# Patient Record
Sex: Female | Born: 1940 | Race: White | Hispanic: No | Smoking: Former smoker
Health system: Southern US, Community
[De-identification: ages and names within clinical notes are randomized; demographics above are authoritative.]

## PROBLEM LIST (undated history)

## (undated) DIAGNOSIS — W3400XA Accidental discharge from unspecified firearms or gun, initial encounter: Secondary | ICD-10-CM

## (undated) DIAGNOSIS — Z5112 Encounter for antineoplastic immunotherapy: Secondary | ICD-10-CM

## (undated) DIAGNOSIS — Z87442 Personal history of urinary calculi: Secondary | ICD-10-CM

## (undated) DIAGNOSIS — I639 Cerebral infarction, unspecified: Secondary | ICD-10-CM

## (undated) DIAGNOSIS — I499 Cardiac arrhythmia, unspecified: Secondary | ICD-10-CM

## (undated) DIAGNOSIS — K219 Gastro-esophageal reflux disease without esophagitis: Secondary | ICD-10-CM

## (undated) DIAGNOSIS — S41031A Puncture wound without foreign body of right shoulder, initial encounter: Secondary | ICD-10-CM

## (undated) DIAGNOSIS — Z87440 Personal history of urinary (tract) infections: Secondary | ICD-10-CM

## (undated) DIAGNOSIS — K769 Liver disease, unspecified: Secondary | ICD-10-CM

## (undated) DIAGNOSIS — J189 Pneumonia, unspecified organism: Secondary | ICD-10-CM

## (undated) DIAGNOSIS — K8020 Calculus of gallbladder without cholecystitis without obstruction: Secondary | ICD-10-CM

## (undated) DIAGNOSIS — E669 Obesity, unspecified: Secondary | ICD-10-CM

## (undated) DIAGNOSIS — E785 Hyperlipidemia, unspecified: Secondary | ICD-10-CM

## (undated) DIAGNOSIS — C50919 Malignant neoplasm of unspecified site of unspecified female breast: Secondary | ICD-10-CM

## (undated) DIAGNOSIS — C3490 Malignant neoplasm of unspecified part of unspecified bronchus or lung: Secondary | ICD-10-CM

## (undated) DIAGNOSIS — I10 Essential (primary) hypertension: Secondary | ICD-10-CM

## (undated) DIAGNOSIS — N19 Unspecified kidney failure: Secondary | ICD-10-CM

## (undated) DIAGNOSIS — C189 Malignant neoplasm of colon, unspecified: Secondary | ICD-10-CM

## (undated) DIAGNOSIS — I4891 Unspecified atrial fibrillation: Secondary | ICD-10-CM

## (undated) DIAGNOSIS — T148XXA Other injury of unspecified body region, initial encounter: Secondary | ICD-10-CM

## (undated) DIAGNOSIS — G459 Transient cerebral ischemic attack, unspecified: Secondary | ICD-10-CM

## (undated) DIAGNOSIS — R200 Anesthesia of skin: Secondary | ICD-10-CM

## (undated) DIAGNOSIS — Z9981 Dependence on supplemental oxygen: Secondary | ICD-10-CM

## (undated) DIAGNOSIS — N6019 Diffuse cystic mastopathy of unspecified breast: Secondary | ICD-10-CM

## (undated) DIAGNOSIS — L989 Disorder of the skin and subcutaneous tissue, unspecified: Secondary | ICD-10-CM

## (undated) DIAGNOSIS — J302 Other seasonal allergic rhinitis: Secondary | ICD-10-CM

## (undated) DIAGNOSIS — H9319 Tinnitus, unspecified ear: Secondary | ICD-10-CM

## (undated) DIAGNOSIS — R0602 Shortness of breath: Secondary | ICD-10-CM

## (undated) DIAGNOSIS — J449 Chronic obstructive pulmonary disease, unspecified: Secondary | ICD-10-CM

## (undated) DIAGNOSIS — C801 Malignant (primary) neoplasm, unspecified: Secondary | ICD-10-CM

## (undated) DIAGNOSIS — B029 Zoster without complications: Secondary | ICD-10-CM

## (undated) DIAGNOSIS — G629 Polyneuropathy, unspecified: Secondary | ICD-10-CM

## (undated) DIAGNOSIS — D649 Anemia, unspecified: Secondary | ICD-10-CM

## (undated) DIAGNOSIS — R202 Paresthesia of skin: Secondary | ICD-10-CM

## (undated) HISTORY — DX: Cerebral infarction, unspecified: I63.9

## (undated) HISTORY — DX: Malignant neoplasm of unspecified site of unspecified female breast: C50.919

## (undated) HISTORY — DX: Other seasonal allergic rhinitis: J30.2

## (undated) HISTORY — PX: DILATION AND CURETTAGE OF UTERUS: SHX78

## (undated) HISTORY — DX: Calculus of gallbladder without cholecystitis without obstruction: K80.20

## (undated) HISTORY — DX: Malignant (primary) neoplasm, unspecified: C80.1

## (undated) HISTORY — DX: Hyperlipidemia, unspecified: E78.5

## (undated) HISTORY — DX: Essential (primary) hypertension: I10

## (undated) HISTORY — DX: Unspecified atrial fibrillation: I48.91

## (undated) HISTORY — PX: OTHER SURGICAL HISTORY: SHX169

## (undated) HISTORY — PX: LUNG CANCER SURGERY: SHX702

## (undated) HISTORY — DX: Liver disease, unspecified: K76.9

## (undated) HISTORY — DX: Malignant neoplasm of colon, unspecified: C18.9

## (undated) HISTORY — DX: Diffuse cystic mastopathy of unspecified breast: N60.19

## (undated) HISTORY — DX: Encounter for antineoplastic immunotherapy: Z51.12

## (undated) HISTORY — DX: Transient cerebral ischemic attack, unspecified: G45.9

## (undated) HISTORY — DX: Zoster without complications: B02.9

## (undated) HISTORY — PX: BREAST SURGERY: SHX581

## (undated) HISTORY — DX: Obesity, unspecified: E66.9

## (undated) HISTORY — PX: COLONOSCOPY: SHX174

---
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
## 1988-12-27 HISTORY — PX: VAGINAL HYSTERECTOMY: SUR661

## 2002-12-27 DIAGNOSIS — I639 Cerebral infarction, unspecified: Secondary | ICD-10-CM

## 2002-12-27 HISTORY — DX: Cerebral infarction, unspecified: I63.9

## 2006-07-25 ENCOUNTER — Encounter: Payer: Self-pay | Admitting: Internal Medicine

## 2009-05-21 ENCOUNTER — Ambulatory Visit: Payer: Self-pay | Admitting: Internal Medicine

## 2009-05-21 DIAGNOSIS — I6350 Cerebral infarction due to unspecified occlusion or stenosis of unspecified cerebral artery: Secondary | ICD-10-CM

## 2009-07-30 ENCOUNTER — Ambulatory Visit: Payer: Self-pay | Admitting: Internal Medicine

## 2009-07-30 ENCOUNTER — Ambulatory Visit: Payer: Self-pay

## 2009-07-30 ENCOUNTER — Encounter: Payer: Self-pay | Admitting: Internal Medicine

## 2009-09-10 ENCOUNTER — Ambulatory Visit: Payer: Self-pay | Admitting: Internal Medicine

## 2010-02-17 ENCOUNTER — Ambulatory Visit: Payer: Self-pay | Admitting: Internal Medicine

## 2010-02-20 ENCOUNTER — Telehealth: Payer: Self-pay | Admitting: Internal Medicine

## 2010-04-12 ENCOUNTER — Emergency Department (HOSPITAL_COMMUNITY): Admission: EM | Admit: 2010-04-12 | Discharge: 2010-04-12 | Payer: Self-pay | Admitting: Emergency Medicine

## 2010-04-14 ENCOUNTER — Telehealth: Payer: Self-pay | Admitting: Internal Medicine

## 2010-04-21 ENCOUNTER — Ambulatory Visit (HOSPITAL_BASED_OUTPATIENT_CLINIC_OR_DEPARTMENT_OTHER): Admission: RE | Admit: 2010-04-21 | Discharge: 2010-04-21 | Payer: Self-pay | Admitting: Urology

## 2010-05-13 ENCOUNTER — Ambulatory Visit: Payer: Self-pay | Admitting: Cardiology

## 2010-05-22 ENCOUNTER — Telehealth (INDEPENDENT_AMBULATORY_CARE_PROVIDER_SITE_OTHER): Payer: Self-pay | Admitting: *Deleted

## 2010-06-01 ENCOUNTER — Ambulatory Visit (HOSPITAL_COMMUNITY): Admission: RE | Admit: 2010-06-01 | Discharge: 2010-06-01 | Payer: Self-pay | Admitting: Urology

## 2010-06-15 ENCOUNTER — Encounter: Payer: Self-pay | Admitting: Cardiology

## 2010-08-20 ENCOUNTER — Encounter: Payer: Self-pay | Admitting: Pulmonary Disease

## 2010-08-20 ENCOUNTER — Encounter: Admission: RE | Admit: 2010-08-20 | Discharge: 2010-08-20 | Payer: Self-pay | Admitting: Family Medicine

## 2010-09-04 ENCOUNTER — Ambulatory Visit: Payer: Self-pay | Admitting: Pulmonary Disease

## 2010-09-04 ENCOUNTER — Encounter: Payer: Self-pay | Admitting: Pulmonary Disease

## 2010-09-04 DIAGNOSIS — R0609 Other forms of dyspnea: Secondary | ICD-10-CM

## 2010-09-04 DIAGNOSIS — R932 Abnormal findings on diagnostic imaging of liver and biliary tract: Secondary | ICD-10-CM | POA: Insufficient documentation

## 2010-09-04 DIAGNOSIS — R930 Abnormal findings on diagnostic imaging of skull and head, not elsewhere classified: Secondary | ICD-10-CM

## 2010-09-04 LAB — CONVERTED CEMR LAB: Sed Rate: 17 mm/hr (ref 0–22)

## 2010-09-07 ENCOUNTER — Telehealth: Payer: Self-pay | Admitting: Pulmonary Disease

## 2010-09-07 LAB — CONVERTED CEMR LAB: Rhuematoid fact SerPl-aCnc: <20 intl units/mL (ref 0–20)

## 2010-11-12 ENCOUNTER — Telehealth (INDEPENDENT_AMBULATORY_CARE_PROVIDER_SITE_OTHER): Payer: Self-pay | Admitting: *Deleted

## 2010-11-30 ENCOUNTER — Encounter: Admission: RE | Admit: 2010-11-30 | Discharge: 2010-11-30 | Payer: Self-pay | Admitting: Family Medicine

## 2010-11-30 ENCOUNTER — Encounter: Payer: Self-pay | Admitting: Pulmonary Disease

## 2010-12-04 ENCOUNTER — Encounter: Payer: Self-pay | Admitting: Pulmonary Disease

## 2010-12-04 ENCOUNTER — Telehealth (INDEPENDENT_AMBULATORY_CARE_PROVIDER_SITE_OTHER): Payer: Self-pay | Admitting: *Deleted

## 2010-12-07 ENCOUNTER — Encounter: Payer: Self-pay | Admitting: Cardiology

## 2010-12-09 ENCOUNTER — Ambulatory Visit: Payer: Self-pay | Admitting: Cardiology

## 2010-12-09 ENCOUNTER — Encounter: Payer: Self-pay | Admitting: Cardiology

## 2010-12-10 ENCOUNTER — Ambulatory Visit: Payer: Self-pay | Admitting: Pulmonary Disease

## 2010-12-10 DIAGNOSIS — J984 Other disorders of lung: Secondary | ICD-10-CM | POA: Insufficient documentation

## 2010-12-15 ENCOUNTER — Ambulatory Visit: Payer: Self-pay | Admitting: Pulmonary Disease

## 2010-12-22 ENCOUNTER — Encounter: Payer: Self-pay | Admitting: Cardiology

## 2010-12-24 ENCOUNTER — Telehealth: Payer: Self-pay | Admitting: Cardiology

## 2010-12-25 ENCOUNTER — Ambulatory Visit (HOSPITAL_COMMUNITY)
Admission: RE | Admit: 2010-12-25 | Discharge: 2010-12-25 | Payer: Self-pay | Source: Home / Self Care | Attending: Emergency Medicine | Admitting: Emergency Medicine

## 2010-12-29 ENCOUNTER — Telehealth: Payer: Self-pay | Admitting: Pulmonary Disease

## 2011-01-04 ENCOUNTER — Ambulatory Visit
Admission: RE | Admit: 2011-01-04 | Discharge: 2011-01-04 | Payer: Self-pay | Source: Home / Self Care | Attending: Pulmonary Disease | Admitting: Pulmonary Disease

## 2011-01-07 ENCOUNTER — Telehealth (INDEPENDENT_AMBULATORY_CARE_PROVIDER_SITE_OTHER): Payer: Self-pay | Admitting: *Deleted

## 2011-01-11 ENCOUNTER — Telehealth: Payer: Self-pay | Admitting: Cardiology

## 2011-01-21 ENCOUNTER — Other Ambulatory Visit: Payer: Self-pay | Admitting: Pulmonary Disease

## 2011-01-21 DIAGNOSIS — R911 Solitary pulmonary nodule: Secondary | ICD-10-CM

## 2011-01-26 NOTE — Assessment & Plan Note (Signed)
Summary: 4 month rov/sl   Visit Type:  Follow-up Primary Provider:  Marjory Lies  CC:  palpitations Pt has moved to [facility] and would like to be seen out there - Pt need refills today [facility] [address].  History of Present Illness: Tammie [name] is seen in followup for what is now likely permananent atrial fibrillation At her last visit we increased her beta blocker and discontinued jer calcium blocker because of edema. That has resolved.  .  We anticiapted need to assess heaert rated excursion.  She is having some palpitations but overall is feeling pretty good. Her exercise tolerance is quite good. Her shortness of breath is decreased  Current Medications (verified): 1)  Multivitamins   Tabs (Multiple Vitamin) .Marland Kitchen.. 1 Tab Once Daily 2)  Diphenhydramine Hcl 25 Mg Caps (Diphenhydramine Hcl) .Marland Kitchen.. 1 Cap At Bedtime 3)  Aspirin 81 Mg Tbec (Aspirin) .... Take One Tablet By Mouth Daily 4)  Flax Seed Oil 1000 Mg Caps (Flaxseed (Linseed)) .Marland Kitchen.. 1 Cap Once Daily 5)  Garlic 1000 Mg Caps (Garlic) .Marland Kitchen.. 1 Cap Once Daily 6)  Vitamin D 1000 Unit Caps (Cholecalciferol) .Marland Kitchen.. 1 Cap Once Daily 7)  Calcium Citrate + D 300-100 Mg-Unit Tabs (Calcium Citrate-Vitamin D) .Marland Kitchen.. 1 Cap Once Daily 8)  Fish Oil 1000 Mg Caps (Omega-3 Fatty Acids) .Marland Kitchen.. 1 Cap Once Daily 9)  Mapap Arthritis Pain 650 Mg Cr-Tabs (Acetaminophen) .... 2 Caps Two Times A Day 10)  Warfarin Sodium 5 Mg Tabs (Warfarin Sodium) .... Use As Directed By Anticoagulation Clinic 11)  Metoprolol Tartrate 100 Mg Tabs (Metoprolol Tartrate) .... Take 1/2 Two Times A Day  Allergies (verified): 1)  ! * Tape  Past History:  Past Medical History: Last updated: 05/21/2009 Atrial fibrillation Hypertension Prior stroke x2 Asthma Arthritis Allergies-seasonal Nephrolithiasis Cystic breast disease  Past Surgical History: Last updated: 05/21/2009 tonsillectomy Hysterectomy D&C Breast cystectomy  Family History: Last updated: 05/21/2009 Family  History of Cancer:   Social History: Last updated: 05/21/2009 Widowed  Alcohol Use - no Tobacco Use - No.  Regular Exercise - no  Risk Factors: Exercise: no (05/21/2009)  Risk Factors: Smoking Status: never (05/21/2009)  Vital Signs:  Patient profile:   70 year old female Height:      71 inches Weight:      254 pounds BMI:     35.55 Pulse rate:   81 / minute Pulse rhythm:   irregular BP sitting:   136 / 76  (left arm) Cuff size:   large  Vitals Entered By: Tammie Kanaris, CNA (February 17, 2010 10:42 AM)  Physical Exam  General:  Well developed, well nourished, in no acute distress. Neck:  Neck supple, no JVD. No masses, thyromegaly or abnormal cervical nodes. Lungs:  Clear bilaterally to auscultation and percussion. Heart:  irregular rate and rhythm without murmurs or gallops Abdomen:  soft nontender with active bowel Extremities:  no clubbing or cyanosis or edema Neurologic:  grossly nor Skin:  warm and dry Psych:  affect normal   EKG  Procedure date:  02/17/2010  Findings:      atrial fibrillation at 82 Intervals-/0.09/0.36 Axis LXXXII  Impression & Recommendations:  Problem # 1:  ATRIAL FIBRILLATION (ICD-427.31) Her atrial fibrillation is permanent.  Will continue her on her beta blocker to 50 mg twice daily for now. She may need augmented rate control later but I'm not convinced  We will discontinue her aspirin at this point as it increases the risk of bleeding but does not seem to  decrease the risk of stroke. I should note that her prior strokes occurred on aspirin and not on Coumadin. The following medications were removed from the medication list:    Aspirin 81 Mg Tbec (Aspirin) .Marland Kitchen... Take one tablet by mouth daily Her updated medication list for this problem includes:    Warfarin Sodium 5 Mg Tabs (Warfarin sodium) ..... Use as directed by anticoagulation clinic    Metoprolol Tartrate 100 Mg Tabs (Metoprolol tartrate) .Marland Kitchen... Take 1/2 two times a  day  Problem # 2:  EDEMA (ICD-782.3) largely improved following the discontinuation of her calcium blocker  Patient Instructions: 1)  Your physician has recommended you make the following change in your medication: STOP ASPIRIN 2)  Your physician recommends that you schedule a follow-up appointment in: 3 MONTHS WITH DR. HOCHREIN

## 2011-01-26 NOTE — Miscellaneous (Signed)
  Clinical Lists Changes  ct chest done by DR.Moore a month early.Marland KitchenMarland KitchenI had scheduled her for a ct in Jan.  The superior segment lesion appears to have enlarged.  Need to consider biopsy.  will arrange ov to discuss.  Appended Document:  mindy, pt needs ov to discuss recent ct.  please arrange for ov. also, please call [facility] and see if they can make a super D disk of the pt's ct from 11/30/10.  please send to [place].  Appended Document:  lmomtcb x1  Appended Document:  called and spoke with [facility] and they will do a super d and send to [place]  Appended Document:  see phone note

## 2011-01-26 NOTE — Progress Notes (Signed)
Summary: REFILL MEDS  Phone Note Refill Request Call back at Home Phone [phone] Message from:  Patient on February 20, 2010 12:20 PM  Refills Requested: Medication #1:  WARFARIN SODIUM 5 MG TABS Use as directed by Anticoagulation Clinic  Medication #2:  METOPROLOL TARTRATE 100 MG TABS take 1/2 two times a day. [facility] [place] [facility] [place] [phone]   Method Requested: Fax to Local Pharmacy Initial call taken by: Lorne Skeens,  February 20, 2010 12:21 PM  Follow-up for Phone Call        Rx faxed to pharmacy Metoprolol  sent flag to coum Follow-up by: Oswald Hillock,  February 20, 2010 1:14 PM    Prescriptions: METOPROLOL TARTRATE 100 MG TABS (METOPROLOL TARTRATE) take 1/2 two times a day  #90 x 1   Entered by:   Oswald Hillock   Authorized by:   Nathen May, MD, [facility]   Signed by:   Oswald Hillock on 02/20/2010   Method used:   Electronically to        [facility]  [address] (retail)       [address]       [place], [place]  [address]       Ph: [phone]       Fax: [phone]   RxID:   [id number]  Contacted PCP Dr. Doristine Counter per ov note.  Thayer Ohm verified pt has coumadin followed by Dr. Doristine Counter.  They will authorize refill of Coumadin

## 2011-01-26 NOTE — Progress Notes (Signed)
Summary: returning call  Phone Note Call from Patient Call back at Home Phone [phone]   Caller: Patient Reason for Call: Talk to Nurse Summary of Call: Returning Mindy's call. Initial call taken by: Lehman Prom,  December 04, 2010 4:17 PM  Follow-up for Phone Call        Mindy, the pt is calling back and I see that she needs ov to discuss results of her recent CT Chest- [facility] first available is not until 12/15/10- do we need to get her in any sooner, or will this date be okay? Pls advise, thanks! Follow-up by: Vernie Murders,  December 04, 2010 4:25 PM  Additional Follow-up for Phone Call Additional follow up Details #1::        PT is coming in on 12/15/2010 at 10:45 with dr. Shelle Iron. I informed pt if we have a cancellation I will try to get her in sooner [facility]  December 04, 2010 5:01 PM

## 2011-01-26 NOTE — Progress Notes (Signed)
Summary: CT in 4 mths  Phone Note From Other Clinic Call back at [phone]   Caller: Aurea Graff from Dr. Vernon Prey office Call For: clance Summary of Call: Aurea Graff states that they did a CT of the pt in August with order to repeat in 3 months, but then in the meantime pt saw Dr. Shelle Iron and rec to repeat in 4 months, so they just wanted to verify this and to make sure CT order was placed. I advised per last OV note he did say to repeat in 4 mths, but I do not see an order for this. Dr Shelle Iron do you want a CT chest onthis pt in 4 months. Pelase advsie.  Initial call taken by: Carron Curie CMA,  November 12, 2010 11:12 AM  Follow-up for Phone Call        she needs a ct chest in JAN.  will send an order to [facility] Follow-up by: Barbaraann Share MD,  November 12, 2010 5:08 PM  Additional Follow-up for Phone Call Additional follow up Details #1::        LMOMTCB Vernie Murders  November 12, 2010 5:12 PM  Spoke with Jan at [facility] office and notified that pt needs ct chest in Jan and that we are already in the process of setting this up for her.   Additional Follow-up by: Vernie Murders,  November 13, 2010 8:51 AM

## 2011-01-26 NOTE — Procedures (Signed)
Summary: Summary Report  Summary Report   Imported By: Erle Crocker 07/17/2010 09:50:57  _____________________________________________________________________  External Attachment:    Type:   Image     Comment:   External Document

## 2011-01-26 NOTE — Assessment & Plan Note (Signed)
Summary: Tammie [name]   Visit Type:  Follow-up Primary Provider:  Dr. Vernon Prey  CC:  atrial fibrillation.  History of Present Illness: The patient presents for followup of the above. She has permanent atrial fibrillation. She was seeing Dr. Graciela Husbands but wanted to be seen here for convenience. He did adjust her medications by discontinuing aspirin. She remains on Coumadin. Because of lower extremity swelling he switched to beta blockers alone for rate control discontinuing calcium blockers. She's noticed no problems with this. She's had no palpitations except occasionally at night. She's had no presyncope or syncope. She's had no chest pressure, neck or arm discomfort. She denies any new shortness of breath though she has some chronic mild dyspnea with activities. She doesn't describe PND or orthopnea. She has very mild lower extremity swelling.  Of note she is being treated currently for nephrolithiasis.  Current Medications (verified): 1)  Multivitamins   Tabs (Multiple Vitamin) .... 1/2 By Mouth Bid 2)  Diphenhydramine Hcl 25 Mg Caps (Diphenhydramine Hcl) .Marland Kitchen.. 1 By Mouth Bid 3)  Flax Seed Oil 1000 Mg Caps (Flaxseed (Linseed)) .Marland Kitchen.. 1 Cap Once Daily 4)  Garlic 1000 Mg Caps (Garlic) .Marland Kitchen.. 1 Cap Once Daily 5)  Vitamin D 1000 Unit Caps (Cholecalciferol) .Marland Kitchen.. 1 Cap Once Daily 6)  Calcium Citrate + D 300-100 Mg-Unit Tabs (Calcium Citrate-Vitamin D) .Marland Kitchen.. 1 Cap Once Daily 7)  Fish Oil 1000 Mg Caps (Omega-3 Fatty Acids) .Marland Kitchen.. 1 Cap Once Daily 8)  Warfarin Sodium 5 Mg Tabs (Warfarin Sodium) .... Use As Directed By Anticoagulation Clinic 9)  Metoprolol Tartrate 100 Mg Tabs (Metoprolol Tartrate) .... Take 1/2 Two Times A Day 10)  Acetaminophen 325 Mg  Tabs (Acetaminophen) .... 2 By Mouth Two Times A Day 11)  Combivent 18-103 Mcg/act Aero (Ipratropium-Albuterol) .... As Needed  Allergies (verified): 1)  ! * Tape  Past History:  Past Medical History: Atrial fibrillation Hypertension Prior stroke  x2 Asthma Arthritis Allergies-seasonal Nephrolithiasis Cystic breast disease Nephrolithiasis  Past Surgical History: Tonsillectomy Hysterectomy D&C Breast cystectomy  Review of Systems       Decreased memory. Otherwise as stated in the history of present illness negative for all other systems.  Vital Signs:  Patient profile:   70 year old female Height:      71 inches Weight:      251 pounds BMI:     35.13 Pulse rate:   72 / minute Resp:     16 per minute BP sitting:   132 / 76  (right arm)  Vitals Entered By: Marrion Coy, CNA (May 13, 2010 10:12 AM)  Physical Exam  General:  Well developed, well nourished, in no acute distress. Head:  normocephalic and atraumatic Eyes:  PERRLA/EOM intact; conjunctiva and lids normal. Mouth:  Upper dentures, gums and palate normal. Oral mucosa normal. Neck:  Neck supple, no JVD. No masses, thyromegaly or abnormal cervical nodes. Chest Wall:  no deformities or breast masses noted Lungs:  Clear bilaterally to auscultation and percussion. Abdomen:  Bowel sounds positive; abdomen soft and non-tender without masses, organomegaly, or hernias noted. No hepatosplenomegaly. Msk:  Back normal, normal gait. Muscle strength and tone normal. Extremities:  No clubbing or cyanosis. Neurologic:  Alert and oriented x 3. Skin:  Intact without lesions or rashes. Psych:  Normal affect.   Detailed Cardiovascular Exam  Neck    Carotids: Carotids full and equal bilaterally without bruits.      Neck Veins: Normal, no JVD.    Heart  Inspection: no deformities or lifts noted.      Palpation: normal PMI with no thrills palpable.      Auscultation: irregular rate and rhythm, S1, S2 without murmurs, rubs, gallops, or clicks.    Vascular    Abdominal Aorta: no palpable masses, pulsations, or audible bruits.      Femoral Pulses: normal femoral pulses bilaterally.      Pedal Pulses: normal pedal pulses bilaterally.      Radial Pulses: normal radial  pulses bilaterally.      Peripheral Circulation: no clubbing, cyanosis, or edema noted with normal capillary refill.     EKG  Procedure date:  05/13/2010  Findings:      Atrial fibrillation, rate 72, axis within normal limits, intervals within normal limits, no acute ST-T wave changes  Impression & Recommendations:  Problem # 1:  ATRIAL FIBRILLATION (ICD-427.31) She seems to have reasonable rate control but with the med changes she needs a 24-hour Holter monitor to evaluate. She will remain on Coumadin. Orders: EKG w/ Interpretation (93000)  Problem # 2:  HYPERTENSION, BENIGN (ICD-401.1) Her blood pressure is controlled. She will continue the meds as listed.  Problem # 3:  CVA (ICD-434.91) The patient continues to have treatment for kidney stones. If she needs to come off the Coumadin she needs a bridging low molecular weight heparin given her prior CVA. I reviewed the records and this information has been relayed to her urologist.  Patient Instructions: 1)  Your physician recommends that you schedule a follow-up appointment in: 1 yr with Dr Antoine Poche in [employer] [employer] 2)  Your physician recommends that you continue on your current medications as directed. Please refer to the Current Medication list given to you today. 3)  Your physician has recommended that you wear a holter monitor.  Holter monitors are medical devices that record the heart's electrical activity. Doctors most often use these monitors to diagnose arrhythmias. Arrhythmias are problems with the speed or rhythm of the heartbeat. The monitor is a small, portable device. You can wear one while you do your normal daily activities. This is usually used to diagnose what is causing palpitations/syncope (passing out). 4)  You have been diagnosed with atrial fibrillation.  Atrial fibrillation is a condition in which one of the upper chambers of the heart has extra electrical cells causing it to beat very fast.  Please see the  handout/brochure given to you today for further information.

## 2011-01-26 NOTE — Progress Notes (Signed)
Summary: results  Phone Note Call from Patient Call back at Home Phone [phone] Call back at [phone] cell   Caller: Patient Call For: clance Reason for Call: Talk to Nurse Summary of Call: pt called for lab results. Initial call taken by: Eugene Gavia,  September 07, 2010 10:13 AM  Follow-up for Phone Call        Pls advise lab results. thanks  Follow-up by: Vernie Murders,  September 07, 2010 10:19 AM  Additional Follow-up for Phone Call Additional follow up Details #1::        see addendum under labs. Additional Follow-up by: Barbaraann Share MD,  September 07, 2010 5:09 PM

## 2011-01-26 NOTE — Progress Notes (Signed)
Summary: stop coumadin prior to kideny stone  Phone Note Call from Patient Call back at Home Phone [phone]   Caller: Patient Reason for Call: Talk to Nurse Summary of Call: pt need to come off coumadin due to kidney stone.  Initial call taken by: Lorne Skeens,  April 14, 2010 12:40 PM  Follow-up for Phone Call        pt saw Urologist Dr Hazle Coca [phone]) this am needs Dr Odessa Fleming approval to hold coumadin so they can bust up stone and remove, if can't hold coumadin then have to put stent in and remove, will forward to Dr Graciela Husbands and Joice Lofts for review Meredith Staggers, RN  April 14, 2010 12:54 PM   Additional Follow-up for Phone Call Additional follow up Details #1::        with prior stroke need [facility] bridging Additional Follow-up by: Nathen May, MD, [facility],  April 14, 2010 5:51 PM     Appended Document: stop coumadin prior to kideny stone [facility] follows patient's Coumadin.  Pt aware of plan.  Note faxed to [facility] and Dr Hazle Coca with Dr Odessa Fleming recommendations.

## 2011-01-26 NOTE — Progress Notes (Signed)
Summary: results from heart monitor  mailbox not set up  Phone Note Call from Patient Call back at Home Phone [phone] Call back at [phone]   Caller: Patient Summary of Call: Pt  calling for result fro heart monitor Initial call taken by: Judie Grieve,  May 22, 2010 9:20 AM  Follow-up for Phone Call        called pt but voicemail box has not been set up yet.  Monitor demonstrates mostly AT Fib with average HR of 81.  Dr Antoine Poche to review on his return   voicemail box still not set up yet.  Will continue to try to call 06/02/10 6 pm  pfh,rn Follow-up by: Charolotte Capuchin, RN,  May 22, 2010 10:58 AM  Additional Follow-up for Phone Call Additional follow up Details #1::        pt aware of results Additional Follow-up by: Charolotte Capuchin, RN,  June 09, 2010 5:26 PM

## 2011-01-26 NOTE — Assessment & Plan Note (Signed)
Summary: consult for abnormal ct chest and dyspnea   Copy to:  Paulita Cradle NP Primary Provider/Referring Provider:  Dr. Vernon Prey  CC:  Pulmonary consult. pt is her to discuss abnormal ct chest results. Pt states NP Bevelyn Buckles told her she had a spot on her lung. SOB with activity and at rest. Pt states she just started having a productive cough with red tint phlem today.  now and pt states she has some wheezing. Pt states she was told she had asthma a couple of years ago. Pt states when she gets too hot she starts to cough .  History of Present Illness: The pt is a 69y/o female who I have been asked to see for an abnormal chest ct, as well as doe.  The pt has had worsening sob the past few months, as well as an early am cough with clear mucus that she thinks is due to postnasal drip.  She denies any hemoptysis, but will see on rare occasions a pink tint to her am mucus.  However, she can get blood out of her nose with blowing at the same time.  She is unsure how many blocks she can walk before she gets sob, but feels she had no issues with walking 6mos ago.  She will not get winded sweeping one room of her house, but can get winded bringing groceries in from the car.  The pt tells me that she has gained 50 pounds over the last one year?  She recently underwent cxr where she was found to have a ?RUL density.  This was followed by a ct chest which revealed no LN, extensive coronary calcifications, as well as small GGO in RUL, RLL, and LLL.  Preventive Screening-Counseling & Management  Alcohol-Tobacco     Smoking Status: quit     Packs/Day: 3.0     Year Started: 1959     Year Quit: 1991  Current Medications (verified): 1)  Multivitamins   Tabs (Multiple Vitamin) .... 1/2 By Mouth Bid 2)  Diphenhydramine Hcl 25 Mg Caps (Diphenhydramine Hcl) .Marland Kitchen.. 1 By Mouth Bid 3)  Flax Seed Oil 1000 Mg Caps (Flaxseed (Linseed)) .Marland Kitchen.. 1 Cap Once Daily 4)  Garlic 1000 Mg Caps (Garlic) .Marland Kitchen.. 1 Cap Once Daily 5)   Vitamin D 1000 Unit Caps (Cholecalciferol) .Marland Kitchen.. 1 Cap Once Daily 6)  Calcium Citrate + D 300-100 Mg-Unit Tabs (Calcium Citrate-Vitamin D) .Marland Kitchen.. 1 Cap Once Daily 7)  Fish Oil 1000 Mg Caps (Omega-3 Fatty Acids) .Marland Kitchen.. 1 Cap Once Daily 8)  Warfarin Sodium 5 Mg Tabs (Warfarin Sodium) .... Use As Directed By Anticoagulation Clinic 9)  Metoprolol Tartrate 100 Mg Tabs (Metoprolol Tartrate) .... Take 1/2 Two Times A Day 10)  Acetaminophen 500 Mg Tabs (Acetaminophen) .... As Directed 11)  Combivent 18-103 Mcg/act Aero (Ipratropium-Albuterol) .... As Needed  Allergies: 1)  ! * Tape 2)  ! Bactrim  Past History:  Past Medical History: Atrial fibrillation-on coumadin Hypertension Prior stroke x2 Arthritis Allergies-seasonal Nephrolithiasis Cystic breast disease Nephrolithiasis  Past Surgical History: Reviewed history from 05/13/2010 and no changes required. Tonsillectomy Hysterectomy D&C Breast cystectomy  Family History: Reviewed history from 05/21/2009 and no changes required. Throat cancer--mother  Social History: Reviewed history from 05/21/2009 and no changes required. 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 QUit in 1991. Start 1959. 3ppd Smoking Status:  quit Packs/Day:  3.0  Review of Systems  The patient complains of shortness of breath with activity, productive cough, non-productive cough, irregular heartbeats, headaches, nasal congestion/difficulty breathing through nose, sneezing, hand/feet swelling, and change in color of mucus.  The patient denies shortness of breath at rest, coughing up blood, chest pain, acid heartburn, indigestion, loss of appetite, weight change, abdominal pain, difficulty swallowing, sore throat, tooth/dental problems, itching, ear ache, anxiety, depression, joint stiffness or pain, rash, and fever.    Vital  Signs:  Patient profile:   70 year old female Height:      71 inches Weight:      268.38 pounds BMI:     37.57 O2 Sat:      94 % on Room air Temp:     98.3 degrees F oral Pulse rate:   102 / minute BP sitting:   148 / 82  (right arm) Cuff size:   large  Vitals Entered By: Carver Fila (September 04, 2010 11:23 AM)  O2 Flow:  Room air CC: Pulmonary consult. pt is her to discuss abnormal ct chest results. Pt states NP Bevelyn Buckles told her she had a spot on her lung. SOB with activity and at rest. Pt states she just started having a productive cough with red tint phlem today.  now, pt states she has some wheezing. Pt states she was told she had asthma a couple of years ago. Pt states when she gets too hot she starts to cough  Is Patient Diabetic? No Comments meds and allergies updated Phone number updated Carver Fila  September 04, 2010 11:32 AM    Physical Exam  General:  obese female in nad Eyes:  PERRLA and EOMI.   Nose:  narrowed but patent bilat. Mouth:  clear  Neck:  no jvd, tmg, LN Lungs:  clear to auscultation Heart:  rrr, no mrg Abdomen:  soft and nontender, bs+ Extremities:  mild edema, no cyanosis  pulses intact distally Neurologic:  alert and oriented, moves all 4.   Impression & Recommendations:  Problem # 1:  DYSPNEA ON EXERTION (ICD-786.09) the pt has doe, but has also gained 50 pounds by her history in the last one year.  She may have obstructive lung disease given her h/o tobacco abuse, but does use combivent as needed.  I have asked her to work on some type of exercise and weight loss program.  If she continues to have sob, or especially if it worsens, may need full pfts to help with evaluation.  I doubt her current findings on chest ct are responsible for her breathing issues, but can't totally exclude.  Problem # 2:  CT, CHEST, ABNORMAL (ICD-793.1)  the pt has small GG densities on ct chest of unknown origin.  Because of their multiple nature, they are less  likely to be bronchogenic cancer.  They are unlikely to be mets since they are ground glass in nature rather than solid densities.  I have told her they are more likely to be inflammatory, but I cannot exclude 100% cancer.  I would like to do some blood work to evaluate for various inflammatory and autoimmune conditions.  If negative, will have to decide about surveillance vs trying to make a diagnosis.  They are really not amenable to TTNA, wedge resection would probably be over aggressive at this point.  We could do ENB to try and get diagnosis.  Will discuss with pt once labwork is available.  Medications Added to Medication List This Visit: 1)  Acetaminophen 500 Mg Tabs (Acetaminophen) .Marland KitchenMarland KitchenMarland Kitchen  As directed  Other Orders: Consultation Level V (84132) Spirometry w/Graph (94010) T-Angiotensin i-Converting Enzyme ([id number]) T-Antinuclear Antib (ANA) [phone]) T- * Misc. Laboratory test [phone]) T- * Misc. Laboratory test [id number]) TLB-Sedimentation Rate (ESR) (85652-ESR)  Patient Instructions: 1)  continue with combivent as needed 2)  work on weight loss and conditioning 3)  will check special bloodwork today, and will call you with results. 4)  will schedule you for a followup scan in 4mos.  Please call if you symptoms change before this.   Immunization History:  Influenza Immunization History:    Influenza:  historical (09/26/2009)

## 2011-01-27 ENCOUNTER — Encounter: Payer: Self-pay | Admitting: Pulmonary Disease

## 2011-01-28 NOTE — Assessment & Plan Note (Signed)
Summary: rov to discuss ct chest results.   Visit Type:  Follow-up Copy to:  Paulita Cradle NP Primary Provider/Referring Provider:  Dr. Vernon Prey  CC:  pt here to discuss ct results..  History of Present Illness: the pt comes in today for f/u of her recent ct chest.  This showed persistent GG densities in right lung, with the largest being in the superior segment of RLL.  They have not changed since ct of 07/2010.  I have reviewed the results with the pt, and answered all of her questions.  Current Medications (verified): 1)  Multivitamins   Tabs (Multiple Vitamin) .... 1/2 By Mouth Bid 2)  Diphenhydramine Hcl 25 Mg Caps (Diphenhydramine Hcl) .Marland Kitchen.. 1 By Mouth Bid 3)  Flax Seed Oil 1000 Mg Caps (Flaxseed (Linseed)) .Marland Kitchen.. 1 Cap Once Daily 4)  Garlic 1000 Mg Caps (Garlic) .Marland Kitchen.. 1 Cap Once Daily 5)  Vitamin D 1000 Unit Caps (Cholecalciferol) .Marland Kitchen.. 1 Cap Once Daily 6)  Calcium Citrate + D 300-100 Mg-Unit Tabs (Calcium Citrate-Vitamin D) .Marland Kitchen.. 1 Cap Once Daily 7)  Fish Oil 1000 Mg Caps (Omega-3 Fatty Acids) .Marland Kitchen.. 1 Cap Once Daily 8)  Warfarin Sodium 5 Mg Tabs (Warfarin Sodium) .... Use As Directed By Anticoagulation Clinic 9)  Metoprolol Tartrate 100 Mg Tabs (Metoprolol Tartrate) .... Take 1 Two Times A Day 10)  Acetaminophen 500 Mg Tabs (Acetaminophen) .... As Directed 11)  Combivent 18-103 Mcg/act Aero (Ipratropium-Albuterol) .... As Needed 12)  Hydrochlorothiazide 12.5 Mg Tabs (Hydrochlorothiazide) .... Take One Tablet By Mouth Daily. 13)  Hydrochlorothiazide 12.5 Mg Caps (Hydrochlorothiazide) .... Once Daily  Allergies (verified): 1)  ! * Tape 2)  ! Bactrim  Past History:  Past medical, surgical, family and social histories (including risk factors) reviewed, and no changes noted (except as noted below).  Past Medical History: Reviewed history from 09/04/2010 and no changes required. Atrial fibrillation-on coumadin Hypertension Prior stroke  x2 Arthritis Allergies-seasonal Nephrolithiasis Cystic breast disease Nephrolithiasis  Past Surgical History: Reviewed history from 05/13/2010 and no changes required. Tonsillectomy Hysterectomy D&C Breast cystectomy  Family History: Reviewed history from 09/04/2010 and no changes required. Throat cancer--mother  Social History: Reviewed history from 09/04/2010 and no changes required. 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 QUit in 1991. Start 1959. 3ppd  Review of Systems       The patient complains of shortness of breath with activity, productive cough, non-productive cough, irregular heartbeats, acid heartburn, indigestion, headaches, nasal congestion/difficulty breathing through nose, hand/feet swelling, and joint stiffness or pain.  The patient denies shortness of breath at rest, coughing up blood, chest pain, loss of appetite, weight change, abdominal pain, difficulty swallowing, sore throat, tooth/dental problems, sneezing, itching, ear ache, anxiety, depression, rash, change in color of mucus, and fever.    Vital Signs:  Patient profile:   70 year old female Height:      71 inches Weight:      264 pounds BMI:     36.95 O2 Sat:      96 % on Room air Temp:     98.1 degrees F oral Pulse rate:   72 / minute BP sitting:   142 / 80  (left arm) Cuff size:   large  Vitals Entered By: Carver Fila (December 10, 2010 11:12 AM)  O2 Flow:  Room air CC: pt here to discuss ct results. Comments meds and allergies updated Phone number updated Mindy Edward Jolly  December 10, 2010 11:12 AM    Physical Exam  General:  obese female in nad Nose:  no purulence or discharge noted. Lungs:  clear Extremities:  mild edema, no cyanosis  Neurologic:  alert and oriented, moves all 4.   Impression & Recommendations:  Problem # 1:  PULMONARY NODULE  (ICD-518.89) the pt has GGO primarily in right lung of unknown origin.  They have not changed since 07/2010, but it is worrisome they have not decreased or resolved since that time.  The pt has a heavy smoking history, and I am concerned about a multicentric BAC?  I have discussed with the pt different approaches.  These include ongoing surveillance, TTNA, ENB, and finally VATS biopsy/resection.  After discussing all of the above, the pt wishes to proceed with ENB.  Will arrange as outpt at [facility], and will need to stop her coumadin ahead of time.  Medications Added to Medication List This Visit: 1)  Metoprolol Tartrate 100 Mg Tabs (Metoprolol tartrate) .... Take 1 two times a day 2)  Hydrochlorothiazide 12.5 Mg Caps (Hydrochlorothiazide) .... Once daily  Other Orders: Est. Patient Level III (47829) Misc. Referral (Misc. Ref)  Patient Instructions: 1)  will get you set up for biopsy of your abnormal spot with bronchoscopy with GPS guidance 2)  will call you once scheduled to let you know when to stop coumadin.   Immunization History:  Influenza Immunization History:    Influenza:  historical (09/26/2010)  Pneumovax Immunization History:    Pneumovax:  historical (09/26/2008)

## 2011-01-28 NOTE — Assessment & Plan Note (Signed)
Summary: rov to discuss bronch results.   Copy to:  Paulita Cradle NP Primary Provider/Referring Provider:  Dr. Vernon Prey  CC:  Ov to discuss bronch results.  Still sob with exertion and at rest.  Coughs up clear to black colored sputum.  Marland Kitchen  History of Present Illness: the pt comes in today for f/u of her recent electromagnetic navigational bronchoscopy.  All three of her nodular/GGO's were isolated with specimens being obtained.  These showed inflammatory changes, but no malignancy.  Cultures were negative as well.  I have reviewed the results with the pt in detail, and answered all of her questions.  Current Medications (verified): 1)  Multivitamins   Tabs (Multiple Vitamin) .... 1/2 By Mouth Bid 2)  Diphenhydramine Hcl 25 Mg Caps (Diphenhydramine Hcl) .Marland Kitchen.. 1 By Mouth Bid 3)  Flax Seed Oil 1000 Mg Caps (Flaxseed (Linseed)) .Marland Kitchen.. 1 Cap Once Daily 4)  Garlic 1000 Mg Caps (Garlic) .... Take 1 Tablet By Mouth Two Times A Day 5)  Vitamin D 1000 Unit Caps (Cholecalciferol) .Marland Kitchen.. 1 Cap Once Daily 6)  Calcium Citrate + D 300-100 Mg-Unit Tabs (Calcium Citrate-Vitamin D) .... 1/2 Tab By Mouth Two Times A Day 7)  Fish Oil 1000 Mg Caps (Omega-3 Fatty Acids) .Marland Kitchen.. 1 Cap Once Daily 8)  Warfarin Sodium 5 Mg Tabs (Warfarin Sodium) .... Use As Directed By Anticoagulation Clinic 9)  Metoprolol Tartrate 100 Mg Tabs (Metoprolol Tartrate) .... Take 1 Two Times A Day 10)  Acetaminophen 500 Mg Tabs (Acetaminophen) .... As Directed 11)  Combivent 18-103 Mcg/act Aero (Ipratropium-Albuterol) .... 2 Puffs Two Times A Day 12)  Hydrochlorothiazide 12.5 Mg Tabs (Hydrochlorothiazide) .... Take One Tablet By Mouth Daily. 13)  Tylenol Extra Strength 500 Mg Tabs (Acetaminophen) .... Take 2 Tabs By Mouth Two Times A Day As Needed  Allergies (verified): 1)  ! * Tape 2)  ! Bactrim  Review of Systems       The patient complains of shortness of breath with activity, shortness of breath at rest, productive cough, irregular  heartbeats, acid heartburn, indigestion, headaches, nasal congestion/difficulty breathing through nose, sneezing, hand/feet swelling, and joint stiffness or pain.  The patient denies non-productive cough, coughing up blood, chest pain, loss of appetite, weight change, abdominal pain, difficulty swallowing, sore throat, tooth/dental problems, itching, ear ache, anxiety, depression, rash, change in color of mucus, and fever.    Vital Signs:  Patient profile:   70 year old female Height:      71 inches Weight:      267.38 pounds BMI:     37.43 O2 Sat:      98 % on Room air Temp:     98.5 degrees F oral Pulse rate:   97 / minute BP sitting:   152 / 80  (right arm) Cuff size:   large  Vitals Entered By: Arman Filter LPN (January 04, 2011 4:02 PM)  O2 Flow:  Room air CC: Ov to discuss bronch results.  Still sob with exertion and at rest.  Coughs up clear to black colored sputum.   Comments Medications reviewed with patient Arman Filter LPN  January 04, 2011 4:02 PM    Physical Exam  General:  obese female in nad Lungs:  clear to auscultation Heart:  rrr Extremities:  mild edema, no cyanosis Neurologic:  alert and oriented, moves all 4.   Impression & Recommendations:  Problem # 1:  PULMONARY NODULE (ICD-518.89) the pt has had ENB of her nodular/GGO's, with only  inflammatory cells being obtained.  The pt understands this may indicate she does not have a malignancy, or she may have a false negative result.  At this point, the options are to follow this with serial ct's, do ct guided TTNA, or consider vats biopsy.  After a long discussion, the decision was made to just follow this with ct.  She understands this represent a malignancy, and that she is to call me if she changes her mind and wishes to approach more aggressively.  Problem # 2:  DYSPNEA ON EXERTION (ICD-786.09) the pt is overweight and deconditioned, and her recent spirometry showed no definite obstructive airways disease.   I have asked her to work on weight reduction and some type of exercise program.  If her issues persist, will do full pfts.  Medications Added to Medication List This Visit: 1)  Garlic 1000 Mg Caps (Garlic) .... Take 1 tablet by mouth two times a day 2)  Calcium Citrate + D 300-100 Mg-unit Tabs (Calcium citrate-vitamin d) .... 1/2 tab by mouth two times a day 3)  Combivent 18-103 Mcg/act Aero (Ipratropium-albuterol) .... 2 puffs two times a day 4)  Tylenol Extra Strength 500 Mg Tabs (Acetaminophen) .... Take 2 tabs by mouth two times a day as needed  Other Orders: Est. Patient Level III (16109) Radiology Referral (Radiology)  Patient Instructions: 1)  will set up followup scan to look at your spots. Please call if you change your mind and want to proceed with biopsy under cat scan guidance.

## 2011-01-28 NOTE — Miscellaneous (Signed)
  Clinical Lists Changes  Observations: Added new observation of HOLTERFIND: AFIB rate controlled Rare PVC's (05/13/2010 14:26)      Holter Monitor  Procedure date:  05/13/2010  Findings:      AFIB rate controlled Rare PVC's

## 2011-01-28 NOTE — Assessment & Plan Note (Signed)
Summary: [facility]   Visit Type:  Follow-up Primary Provider:  Dr. Vernon Prey  CC:  Atrial Fibrillation.  History of Present Illness: The patient presents for followup of atrial fibrillation.  Since I last saw her her blood pressure has been slightly elevated and she was instructed to start taking 100 mg b.i.d. of metoprolol instead of 50 b.i.d.  She starting taking the increased dose every am but on takes the higher dose at night if her BP is elevated above 140 systolic.  She does occasionally have some palpitations but no sustained symptoms. She has no presyncope or syncope. She has no chest pressure, neck or arm discomfort. She has not been exercising routinely because her daughter injured her leg and she has been caring for her. She does bring a blood pressure diary and her systolics are typically slightly above 140 with diastolics in the high 80s.  Current Medications (verified): 1)  Multivitamins   Tabs (Multiple Vitamin) .... 1/2 By Mouth Bid 2)  Diphenhydramine Hcl 25 Mg Caps (Diphenhydramine Hcl) .Marland Kitchen.. 1 By Mouth Bid 3)  Flax Seed Oil 1000 Mg Caps (Flaxseed (Linseed)) .Marland Kitchen.. 1 Cap Once Daily 4)  Garlic 1000 Mg Caps (Garlic) .Marland Kitchen.. 1 Cap Once Daily 5)  Vitamin D 1000 Unit Caps (Cholecalciferol) .Marland Kitchen.. 1 Cap Once Daily 6)  Calcium Citrate + D 300-100 Mg-Unit Tabs (Calcium Citrate-Vitamin D) .Marland Kitchen.. 1 Cap Once Daily 7)  Fish Oil 1000 Mg Caps (Omega-3 Fatty Acids) .Marland Kitchen.. 1 Cap Once Daily 8)  Warfarin Sodium 5 Mg Tabs (Warfarin Sodium) .... Use As Directed By Anticoagulation Clinic 9)  Metoprolol Tartrate 100 Mg Tabs (Metoprolol Tartrate) .... Take 1/2 Two Times A Day --- and As Needed 10)  Acetaminophen 500 Mg Tabs (Acetaminophen) .... As Directed 11)  Combivent 18-103 Mcg/act Aero (Ipratropium-Albuterol) .... As Needed  Allergies (verified): 1)  ! * Tape 2)  ! Bactrim  Past History:  Past Medical History: Reviewed history from 09/04/2010 and no changes required. Atrial  fibrillation-on coumadin Hypertension Prior stroke x2 Arthritis Allergies-seasonal Nephrolithiasis Cystic breast disease Nephrolithiasis  Past Surgical History: Reviewed history from 05/13/2010 and no changes required. Tonsillectomy Hysterectomy D&C Breast cystectomy  Review of Systems       Mild lower extremity edema. Otherwise as stated in the history of present illness negative for all other systems.  Vital Signs:  Patient profile:   70 year old female Height:      71 inches Weight:      263 pounds BMI:     36.81 Pulse rate:   79 / minute Resp:     18 per minute BP sitting:   162 / 92  (right arm)  Vitals Entered By: Marrion Coy, CNA (December 09, 2010 3:12 PM)  Physical Exam  General:  Well developed, well nourished, in no acute distress. Head:  normocephalic and atraumatic Eyes:  PERRLA/EOM intact; conjunctiva and lids normal. Neck:  Neck supple, no JVD. No masses, thyromegaly or abnormal cervical nodes. Chest Wall:  no deformities or breast masses noted Lungs:  Clear bilaterally to auscultation and percussion. Abdomen:  Bowel sounds positive; abdomen soft and non-tender without masses, organomegaly, or hernias noted. No hepatosplenomegaly, obese Msk:  Back normal, normal gait. Muscle strength and tone normal. Extremities:  Mild bilateral edema Neurologic:  Alert and oriented x 3. Skin:  Intact without lesions or rashes. Cervical Nodes:  no significant adenopathy Axillary Nodes:  no significant adenopathy Inguinal Nodes:  no significant adenopathy Psych:  Normal affect.   Detailed  Cardiovascular Exam  Neck    Carotids: Carotids full and equal bilaterally without bruits.      Neck Veins: Normal, no JVD.    Heart    Inspection: no deformities or lifts noted.      Palpation: normal PMI with no thrills palpable.      Auscultation: irregular rate and rhythm, S1, S2 without murmurs, rubs, gallops, or clicks.    Vascular    Abdominal Aorta: no palpable  masses, pulsations, or audible bruits.      Femoral Pulses: normal femoral pulses bilaterally.      Pedal Pulses: normal pedal pulses bilaterally.      Radial Pulses: normal radial pulses bilaterally.      Peripheral Circulation: no clubbing, cyanosis, or edema noted with normal capillary refill.     EKG  Procedure date:  12/09/2010  Findings:      atrial fibrillation, right axis deviation possible lead placement, no acute ST-T wave changes.  Impression & Recommendations:  Problem # 1:  ATRIAL FIBRILLATION (ICD-427.31) She is having no new symptoms related to this. No change in therapy is indicated.  Problem # 2:  HYPERTENSION, BENIGN (ICD-401.1) Her blood pressure is not controlled. I will add hydrochlorothiazide 12.5 mg daily. She will keep her blood pressure diary. She is encouraged to lose weight. We will check a basic metabolic profile in 2 weeks.  Problem # 3:  DYSPNEA ON EXERTION (ICD-786.09) There have been no clear cardiac etiology for this. She is seeing Dr. Shelle Iron and is being followed for pulmonary nodules.  Patient Instructions: 1)  Start HCTZ 12.5mg  daily 2)  Labs in 2 weeks 3)  Your physician wants you to follow-up in:  6 months.  You will receive a reminder letter in the mail two months in advance. If you don't receive a letter, please call our office to schedule the follow-up appointment. Prescriptions: HYDROCHLOROTHIAZIDE 12.5 MG TABS (HYDROCHLOROTHIAZIDE) Take one tablet by mouth daily.  #30 x 6   Entered by:   Meredith Staggers, RN   Authorized by:   Rollene Rotunda, MD, [facility]   Signed by:   Meredith Staggers, RN on 12/09/2010   Method used:   Electronically to        [facility] (retail)       [address]       [place], [place]  [address]       Ph: [phone]       Fax: [phone]   RxID:   [id number]   I have reviewed and approved all prescriptions at the time of this visit. Rollene Rotunda, MD, [facility]  December 09, 2010 4:13  PM

## 2011-01-28 NOTE — Progress Notes (Signed)
Summary: refill request  Phone Note Refill Request Message from:  Patient on January 11, 2011 1:07 PM  Refills Requested: Medication #1:  METOPROLOL TARTRATE 100 MG TABS take 1 two times a day [facility] [address] -dosage changed from 1/2 tab to 1 tab-news new rx for this   Method Requested: Telephone to Pharmacy Initial call taken by: Glynda Jaeger,  January 11, 2011 1:08 PM  Follow-up for Phone Call        Pt notified RX sent into pharmacy. Marrion Coy, CNA  January 11, 2011 2:46 PM  Follow-up by: Marrion Coy, CNA,  January 11, 2011 2:46 PM    Prescriptions: METOPROLOL TARTRATE 100 MG TABS (METOPROLOL TARTRATE) take 1 two times a day  #60 x 6   Entered by:   Marrion Coy, CNA   Authorized by:   Rollene Rotunda, MD, [facility]   Signed by:   Marrion Coy, CNA on 01/11/2011   Method used:   Electronically to        [facility]  [address] (retail)       [address]       [place], [place]  [address]       Ph: [phone]       Fax: [phone]   RxID:   [id number]

## 2011-01-28 NOTE — Progress Notes (Signed)
Summary: re: blood work - short stay  Phone Note Call from Patient Call back at [facility] [phone]   Caller: Patient Reason for Call: Talk to Nurse Summary of Call: per pt calling re blood test. pt would like for pam to call [facility] short stay to dicuss blood work. surgery is tomorrow.  Initial call taken by: Lorne Skeens,  December 24, 2010 9:40 AM  Follow-up for Phone Call        pt had BMP at hospital pre-op Follow-up by: Charolotte Capuchin, RN,  December 24, 2010 9:43 AM

## 2011-01-28 NOTE — Progress Notes (Signed)
Summary: bronch results-follow up scheduled//jwr  Phone Note Call from Patient   Caller: Patient Call For: dr. Shelle Iron Summary of Call: Patient phoned stated that she had a biopsy on Friday and would like the results. Patient can be reached at [phone] Initial call taken by: Vedia Coffer,  December 29, 2010 4:21 PM  Follow-up for Phone Call        Pt requesting results of biopsy done on Friday.  She is aware KC out of office until tomorrow am and ok with this. Dr. Shelle Iron, pls advise.  Thanks! Follow-up by: Gweneth Dimitri RN,  December 29, 2010 5:04 PM  Additional Follow-up for Phone Call Additional follow up Details #1::        let pt know that her bronch results did not show any cancer cells, but I would like for her to come in and discuss results and where to possibly go from here. Additional Follow-up by: Barbaraann Share MD,  December 29, 2010 10:11 PM    Additional Follow-up for Phone Call Additional follow up Details #2::    pt informed of KC recs, appointment scheduled. Zackery Barefoot CMA  December 30, 2010 9:01 AM

## 2011-01-28 NOTE — Progress Notes (Signed)
Summary: bronch results  Phone Note Call from Patient Call back at Home Phone [phone]   Caller: Patient Call For: clance Reason for Call: Talk to Nurse Summary of Call: Patient was seen Monday to discuss bronch results, patient is wanting to speak to nurse about this. 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  She only worked there for 4 mths .  Pt wasn't sure if this would have anything to do with what is in her lungs. Abigail Miyamoto RN  January 07, 2011 9:38 AM   Additional Follow-up for Phone Call Additional follow up Details #1::        brakes used to be made of asbestos, but not sure if the factory she worked in did this.  The abnormalities in her lungs are not consistent with asbestos related lung disease Additional Follow-up by: Barbaraann Share MD,  January 07, 2011 9:51 AM    Additional Follow-up for Phone Call Additional follow up Details #2::    Pt informed of Dr Teddy Spike recommendations regarding work history. Abigail Miyamoto RN  January 07, 2011 10:29 AM

## 2011-03-01 ENCOUNTER — Ambulatory Visit (INDEPENDENT_AMBULATORY_CARE_PROVIDER_SITE_OTHER)
Admission: RE | Admit: 2011-03-01 | Discharge: 2011-03-01 | Disposition: A | Discharge status: Home / Self Care | Payer: Medicare HMO | Source: Ambulatory Visit | Attending: Pulmonary Disease | Admitting: Pulmonary Disease

## 2011-03-01 DIAGNOSIS — J984 Other disorders of lung: Secondary | ICD-10-CM

## 2011-03-01 DIAGNOSIS — R911 Solitary pulmonary nodule: Secondary | ICD-10-CM

## 2011-03-08 ENCOUNTER — Telehealth: Payer: Self-pay | Admitting: Pulmonary Disease

## 2011-03-08 LAB — CULTURE, RESPIRATORY W GRAM STAIN
Culture: NO GROWTH
Gram Stain: NONE SEEN

## 2011-03-08 LAB — FUNGUS CULTURE W SMEAR

## 2011-03-08 LAB — BASIC METABOLIC PANEL
BUN: 14 mg/dL (ref 6–23)
CO2: 29 mEq/L (ref 19–32)
Chloride: 105 mEq/L (ref 96–112)
GFR calc Af Amer: >60 mL/min (ref >60)
Potassium: 4.2 mEq/L (ref 3.5–5.1)

## 2011-03-08 LAB — AFB CULTURE WITH SMEAR (NOT AT ARMC)

## 2011-03-08 LAB — CBC
HCT: 44 % (ref 36.0–46.0)
MCH: 28.5 pg (ref 26.0–34.0)
MCV: 85.8 fL (ref 78.0–100.0)
RBC: 5.13 MIL/uL — ABNORMAL HIGH (ref 3.87–5.11)
WBC: 7.3 10*3/uL (ref 4.0–10.5)

## 2011-03-08 LAB — APTT: aPTT: 35 seconds (ref 24–37)

## 2011-03-08 LAB — SURGICAL PCR SCREEN: Staphylococcus aureus: NEGATIVE

## 2011-03-08 LAB — PROTIME-INR
INR: 1.07 (ref 0.00–1.49)
Prothrombin Time: 14.1 seconds (ref 11.6–15.2)

## 2011-03-15 LAB — BASIC METABOLIC PANEL
BUN: 21 mg/dL (ref 6–23)
Calcium: 9.1 mg/dL (ref 8.4–10.5)
Creatinine, Ser: 0.68 mg/dL (ref 0.4–1.2)
GFR calc non Af Amer: >60 mL/min (ref >60)
Potassium: 4.5 mEq/L (ref 3.5–5.1)

## 2011-03-15 LAB — PROTIME-INR: Prothrombin Time: 35.2 seconds — ABNORMAL HIGH (ref 11.6–15.2)

## 2011-03-16 LAB — URINALYSIS, ROUTINE W REFLEX MICROSCOPIC
Glucose, UA: NEGATIVE mg/dL
Leukocytes, UA: NEGATIVE
Specific Gravity, Urine: 1.015 (ref 1.005–1.030)
pH: 6.5 (ref 5.0–8.0)

## 2011-03-16 LAB — DIFFERENTIAL
Basophils Relative: 1 % (ref 0–1)
Eosinophils Absolute: 0 10*3/uL (ref 0.0–0.7)
Lymphs Abs: 1.3 10*3/uL (ref 0.7–4.0)
Monocytes Relative: 4 % (ref 3–12)
Neutro Abs: 8.7 10*3/uL — ABNORMAL HIGH (ref 1.7–7.7)
Neutrophils Relative %: 83 % — ABNORMAL HIGH (ref 43–77)

## 2011-03-16 LAB — URINE MICROSCOPIC-ADD ON

## 2011-03-16 LAB — CBC
MCHC: 34.4 g/dL (ref 30.0–36.0)
Platelets: 220 10*3/uL (ref 150–400)
RBC: 5.11 MIL/uL (ref 3.87–5.11)
WBC: 10.5 10*3/uL (ref 4.0–10.5)

## 2011-03-16 LAB — PROTIME-INR
INR: 2.85 — ABNORMAL HIGH (ref 0.00–1.49)
Prothrombin Time: 24.4 seconds — ABNORMAL HIGH (ref 11.6–15.2)
Prothrombin Time: 29.7 seconds — ABNORMAL HIGH (ref 11.6–15.2)

## 2011-03-16 LAB — POCT I-STAT, CHEM 8
Calcium, Ion: 1.2 mmol/L (ref 1.12–1.32)
Chloride: 107 mEq/L (ref 96–112)
Glucose, Bld: 114 mg/dL — ABNORMAL HIGH (ref 70–99)
HCT: 40 % (ref 36.0–46.0)
Hemoglobin: 13.6 g/dL (ref 12.0–15.0)

## 2011-03-16 LAB — BASIC METABOLIC PANEL
BUN: 22 mg/dL (ref 6–23)
Calcium: 9.4 mg/dL (ref 8.4–10.5)
Creatinine, Ser: 0.83 mg/dL (ref 0.4–1.2)
GFR calc Af Amer: >60 mL/min (ref >60)

## 2011-03-16 LAB — APTT: aPTT: 59 seconds — ABNORMAL HIGH (ref 24–37)

## 2011-03-16 NOTE — Progress Notes (Signed)
Summary: requesting CT results  Phone Note Call from Patient Call back at Home Phone [phone] P PH     Caller: Patient Call For: [facility] Summary of Call: wants CT results Initial call taken by: Tivis Ringer, CNA,  March 08, 2011 3:59 PM  Follow-up for Phone Call        Pt requesting CT Chest results from 03/01/11.  Results in EMR but unsigned.  Dr. Shelle Iron, pls advise. Thanks! Gweneth Dimitri RN  March 08, 2011 4:08 PM   Additional Follow-up for Phone Call Additional follow up Details #1::        see under addendum to ct Additional Follow-up by: Barbaraann Share MD,  March 08, 2011 5:18 PM

## 2011-04-17 DIAGNOSIS — J45909 Unspecified asthma, uncomplicated: Secondary | ICD-10-CM | POA: Insufficient documentation

## 2011-05-10 ENCOUNTER — Encounter: Payer: Self-pay | Admitting: Family Medicine

## 2011-05-10 ENCOUNTER — Encounter: Payer: Self-pay | Admitting: Nurse Practitioner

## 2011-07-30 ENCOUNTER — Encounter: Payer: Self-pay | Admitting: Cardiology

## 2011-08-27 ENCOUNTER — Telehealth: Payer: Self-pay | Admitting: Pulmonary Disease

## 2011-08-27 NOTE — Telephone Encounter (Signed)
KC, pls advise if she is due for ct, I do not see anything documented under result notes on her last ct, thanks!

## 2011-08-31 ENCOUNTER — Other Ambulatory Visit: Payer: Self-pay | Admitting: Pulmonary Disease

## 2011-08-31 DIAGNOSIS — J984 Other disorders of lung: Secondary | ICD-10-CM

## 2011-08-31 NOTE — Telephone Encounter (Signed)
Order sent to [facility] to set up

## 2011-09-03 ENCOUNTER — Telehealth: Payer: Self-pay | Admitting: Pulmonary Disease

## 2011-09-03 NOTE — Telephone Encounter (Signed)
I spoke with pt and she is aware of the apt and to arrive 15 minutes early. Pt verbalized understanding and had no questions

## 2011-09-03 NOTE — Telephone Encounter (Signed)
ATC line busy x 3. Looks like apt is 9/10 at 1pm

## 2011-09-06 ENCOUNTER — Other Ambulatory Visit: Payer: Self-pay | Admitting: Pulmonary Disease

## 2011-09-06 ENCOUNTER — Ambulatory Visit (INDEPENDENT_AMBULATORY_CARE_PROVIDER_SITE_OTHER)
Admission: RE | Admit: 2011-09-06 | Discharge: 2011-09-06 | Disposition: A | Discharge status: Home / Self Care | Payer: Medicare HMO | Source: Ambulatory Visit | Attending: Pulmonary Disease | Admitting: Pulmonary Disease

## 2011-09-06 DIAGNOSIS — J984 Other disorders of lung: Secondary | ICD-10-CM

## 2011-09-09 ENCOUNTER — Telehealth: Payer: Self-pay | Admitting: Pulmonary Disease

## 2011-09-09 NOTE — Telephone Encounter (Signed)
Spoke with pt and notified of that referral was made to Dr Edwyna Shell on 09/06/11, all records faxed and they should be contacting her soon for appt. Pt verbalized understanding.

## 2011-09-15 ENCOUNTER — Encounter: Payer: Self-pay | Admitting: *Deleted

## 2011-09-16 ENCOUNTER — Other Ambulatory Visit: Payer: Self-pay | Admitting: Thoracic Surgery

## 2011-09-16 ENCOUNTER — Ambulatory Visit (INDEPENDENT_AMBULATORY_CARE_PROVIDER_SITE_OTHER): Payer: Medicare HMO | Admitting: Thoracic Surgery

## 2011-09-16 ENCOUNTER — Encounter: Payer: Self-pay | Admitting: Thoracic Surgery

## 2011-09-16 VITALS — BP 138/82 | HR 88 | Resp 18 | Ht 71.0 in | Wt 275.0 lb

## 2011-09-16 DIAGNOSIS — D381 Neoplasm of uncertain behavior of trachea, bronchus and lung: Secondary | ICD-10-CM

## 2011-09-16 NOTE — Progress Notes (Signed)
PCP is No primary provider on file. Referring Provider is Clance, Maree Krabbe, MD  Chief Complaint  Patient presents with  . Lung Lesion    bilateral    HPI: This patient has had 3 nodules in her lungs that has been followed by Dr.Clance. Serial CT scan showed a gradually increase in all 3 nodules. Previous ENB biopsies were nondiagnostic. She is referred here for evaluation. He is a nonsmoker having quit over 20 years ago. Her husband had died of lung cancer and a left shoulder sarcoma. She's had no hemoptysis fever chills or excessive sputum. Plan to get a PET scan on her.My initial thought is to proceed with a right VATS resection of both right sided lesions per Past Medical History  Diagnosis Date  . Asthma   . Fibrocystic disease of breast   . Mini stroke     x2. Dr. Jefm Miles  / Dr. Lenora Boys   . Hematuria     Dr. Porfirio Mylar    . Renal calculi   . Hyperlipidemia   . Obesity   . Atrial fibrillation     on coumadin   . Hypertension   . Arthritis   . Seasonal allergies   . Nephrolithiasis   . Cystic disease of breast     Past Surgical History  Procedure Date  . Tonsillectomy 50  . Kidney stones [age]  . Cyst of  left breast and right breast     Dr. Irena Reichmann   . Abdominal hysterectomy 1990    Dr. Colon Branch   . Dilation and curettage of uterus     Family History  Problem Relation Age of Onset  . Throat cancer Mother     Social History History  Substance Use Topics  . Smoking status: Former Games developer  . Smokeless tobacco: Never Used   Comment: smoked 3ppd from 1959-1991   . Alcohol Use: No    Current Outpatient Prescriptions  Medication Sig Dispense Refill  . acetaminophen (TYLENOL) 500 MG tablet Take 500 mg by mouth as directed.        Marland Kitchen albuterol-ipratropium (COMBIVENT) 18-103 MCG/ACT inhaler Inhale 2 puffs into the lungs 2 (two) times daily.        . Calcium Citrate-Vitamin D (CALCIUM CITRATE + D) 300-100 MG-UNIT TABS Take 0.5 tablets by mouth 2 (two) times daily.        .  cholecalciferol (VITAMIN D) 1000 UNITS tablet Take 1,000 Units by mouth 2 (two) times daily.       . fish oil-omega-3 fatty acids 1000 MG capsule Take 1 g by mouth daily.       . Flaxseed, Linseed, (FLAX SEED OIL PO) Take by mouth.        . furosemide (LASIX) 20 MG tablet Take 20 mg by mouth as needed.        . Garlic 1000 MG CAPS Take by mouth 2 (two) times daily.        . hydrochlorothiazide 25 MG tablet Take 25 mg by mouth daily.        . metoprolol (TOPROL-XL) 100 MG 24 hr tablet Take 100 mg by mouth daily.        . Multiple Vitamin (MULTIVITAMIN) tablet Take 0.5 tablets by mouth daily.        . simvastatin (ZOCOR) 10 MG tablet Take 10 mg by mouth at bedtime.        Marland Kitchen warfarin (COUMADIN) 5 MG tablet Take 5 mg by mouth daily.        Marland Kitchen  diphenhydrAMINE (SOMINEX) 25 MG tablet Take 25 mg by mouth 2 (two) times daily.        Marland Kitchen levalbuterol (XOPENEX HFA) 45 MCG/ACT inhaler Inhale 1-2 puffs into the lungs every 4 (four) hours as needed.          Allergies  Allergen Reactions  . Iohexol      Code: HIVES, Desc: hives w/ contrast '08// better w/ benadryl   . Sulfa Antibiotics   . Sulfamethoxazole W/Trimethoprim     Review of Systems she has a atrial fibrillation is on Coumadin she gets shortness of breath with exertion she was wheezing had a kidney stone last year. Had a previous mini stroke. She has arthritis no depression or nervousness. No change in her I said or hearing. No bleeding clotting disorders or anemia. No nausea vomiting constipation or diarrhea. BP 138/82  Pulse 88  Resp 18  Ht 5\' 11"  (1.803 m)  Wt 275 lb (124.739 kg)  BMI 38.35 kg/m2  SpO2 94% Physical Exam 6 HEENT are unremarkable. Neck is supple without thyromegaly. There is no supraclavicular axillary adenopathy. Chest is clear auscultation percussion. Heart is a regular rhythm with a 1/6 systolic ejection murmur. Abdomen is obese bowel sounds are normal no masses. Extremity pulses are 1+ no clubbing or edema neurological  she is oriented x3 sensory and motor intact.  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

## 2011-09-22 ENCOUNTER — Ambulatory Visit (HOSPITAL_COMMUNITY)
Admission: RE | Admit: 2011-09-22 | Discharge: 2011-09-22 | Disposition: A | Discharge status: Home / Self Care | Payer: Medicare HMO | Source: Ambulatory Visit | Attending: Thoracic Surgery | Admitting: Thoracic Surgery

## 2011-09-22 DIAGNOSIS — Z79899 Other long term (current) drug therapy: Secondary | ICD-10-CM | POA: Insufficient documentation

## 2011-09-22 DIAGNOSIS — R05 Cough: Secondary | ICD-10-CM | POA: Insufficient documentation

## 2011-09-22 DIAGNOSIS — R062 Wheezing: Secondary | ICD-10-CM | POA: Insufficient documentation

## 2011-09-22 DIAGNOSIS — J988 Other specified respiratory disorders: Secondary | ICD-10-CM | POA: Insufficient documentation

## 2011-09-22 DIAGNOSIS — Z01811 Encounter for preprocedural respiratory examination: Secondary | ICD-10-CM | POA: Insufficient documentation

## 2011-09-22 DIAGNOSIS — J984 Other disorders of lung: Secondary | ICD-10-CM | POA: Insufficient documentation

## 2011-09-22 DIAGNOSIS — R059 Cough, unspecified: Secondary | ICD-10-CM | POA: Insufficient documentation

## 2011-09-22 DIAGNOSIS — R0609 Other forms of dyspnea: Secondary | ICD-10-CM | POA: Insufficient documentation

## 2011-09-22 DIAGNOSIS — R0989 Other specified symptoms and signs involving the circulatory and respiratory systems: Secondary | ICD-10-CM | POA: Insufficient documentation

## 2011-09-22 DIAGNOSIS — D381 Neoplasm of uncertain behavior of trachea, bronchus and lung: Secondary | ICD-10-CM

## 2011-09-22 LAB — PULMONARY FUNCTION TEST

## 2011-09-22 MED ORDER — FLUDEOXYGLUCOSE F - 18 (FDG) INJECTION
18.1000 | Freq: Once | INTRAVENOUS | Status: AC | PRN
  Administered 2011-09-22: 18.1 via INTRAVENOUS

## 2011-09-23 ENCOUNTER — Encounter: Payer: Self-pay | Admitting: Thoracic Surgery

## 2011-09-23 ENCOUNTER — Ambulatory Visit (INDEPENDENT_AMBULATORY_CARE_PROVIDER_SITE_OTHER): Payer: Medicare HMO | Admitting: Thoracic Surgery

## 2011-09-23 VITALS — BP 138/82 | HR 78 | Resp 18 | Ht 71.0 in | Wt 275.0 lb

## 2011-09-23 DIAGNOSIS — D381 Neoplasm of uncertain behavior of trachea, bronchus and lung: Secondary | ICD-10-CM

## 2011-09-23 NOTE — Progress Notes (Addendum)
HPI the patient returns today for a results of her PET scan. A PET scan is positive in the left upper lobe nodule but negative on the right upper lobe and right lower lobe nodule. I discussed the situation with the patient. We plan to do a left VATS resection of the left upper lobe nodule. We will then decide what to do about the right upper lobe in the right lower lobe nodule depending upon the pathology.. We plan to do this on October 9 at [facility]. We will do a left VATS and either a segmentectomy or wedge resection  I discussed the operation with the patient and she understands the operation and agrees to the surgery.  Current Outpatient Prescriptions  Medication Sig Dispense Refill  . acetaminophen (TYLENOL) 500 MG tablet Take 500 mg by mouth as directed.        Marland Kitchen albuterol-ipratropium (COMBIVENT) 18-103 MCG/ACT inhaler Inhale 2 puffs into the lungs 2 (two) times daily.        . Calcium Citrate-Vitamin D (CALCIUM CITRATE + D) 300-100 MG-UNIT TABS Take 0.5 tablets by mouth 2 (two) times daily.        . cholecalciferol (VITAMIN D) 1000 UNITS tablet Take 1,000 Units by mouth 2 (two) times daily.       . diphenhydrAMINE (SOMINEX) 25 MG tablet Take 25 mg by mouth 2 (two) times daily.        . fish oil-omega-3 fatty acids 1000 MG capsule Take 1 g by mouth daily.       . Flaxseed, Linseed, (FLAX SEED OIL PO) Take by mouth.        . furosemide (LASIX) 20 MG tablet Take 20 mg by mouth as needed.        . Garlic 1000 MG CAPS Take by mouth 2 (two) times daily.        . hydrochlorothiazide 25 MG tablet Take 25 mg by mouth daily.        Marland Kitchen levalbuterol (XOPENEX HFA) 45 MCG/ACT inhaler Inhale 1-2 puffs into the lungs every 4 (four) hours as needed.        . metoprolol (TOPROL-XL) 100 MG 24 hr tablet Take 100 mg by mouth daily.        . Multiple Vitamin (MULTIVITAMIN) tablet Take 0.5 tablets by mouth daily.        . simvastatin (ZOCOR) 10 MG tablet Take 10 mg by mouth at bedtime.        Marland Kitchen warfarin  (COUMADIN) 5 MG tablet Take 5 mg by mouth daily.           Review of Systems: No change   Physical Exam  Cardiovascular: Normal rate, regular rhythm and normal heart sounds.   Pulmonary/Chest: Effort normal and breath sounds normal.     Diagnostic Tests: PET scan is positive for left upper lobe nodule   Impression:Left upper lobe mass probable non-small cell lung cancer   Plan: Left VATS resection of left upper lobe mass

## 2011-09-24 ENCOUNTER — Other Ambulatory Visit (HOSPITAL_COMMUNITY): Payer: Medicare HMO

## 2011-09-24 ENCOUNTER — Telehealth: Payer: Self-pay | Admitting: Pharmacist

## 2011-09-24 NOTE — Telephone Encounter (Signed)
This coumadin is apparently followed by [facility].  I reviewed previous notes. Dr. Graciela Husbands in the past suggested bridging anticoagulation of Coumadin needs to be held.  She has had two apparent embolic CVAs.  I agree with this.  This needs to be coordinated through her primary provider.

## 2011-09-24 NOTE — Telephone Encounter (Signed)
Pt called.  She is planning on having a Left VATS resection of left upper lobe mass by Dr. Edwyna Shell.  Needs clearance to stop Coumadin.  Her coumadin is not monitored by our clinic.  Please advise.

## 2011-09-24 NOTE — Telephone Encounter (Signed)
Will ask Dr Antoine Poche if OK or if pt needs to contact the MD who monitors her Coumadin.

## 2011-09-24 NOTE — Telephone Encounter (Signed)
Pt aware and will call [facility]

## 2011-09-24 NOTE — Telephone Encounter (Signed)
See MD note.

## 2011-09-29 ENCOUNTER — Ambulatory Visit (HOSPITAL_COMMUNITY)
Admission: RE | Admit: 2011-09-29 | Discharge: 2011-09-29 | Disposition: A | Discharge status: Home / Self Care | Payer: Medicare HMO | Source: Ambulatory Visit | Attending: Thoracic Surgery | Admitting: Thoracic Surgery

## 2011-09-29 ENCOUNTER — Other Ambulatory Visit: Payer: Self-pay | Admitting: Thoracic Surgery

## 2011-09-29 ENCOUNTER — Encounter (HOSPITAL_COMMUNITY)
Admission: RE | Admit: 2011-09-29 | Discharge: 2011-09-29 | Disposition: A | Discharge status: Home / Self Care | Payer: Medicare HMO | Source: Ambulatory Visit | Attending: Thoracic Surgery | Admitting: Thoracic Surgery

## 2011-09-29 DIAGNOSIS — R918 Other nonspecific abnormal finding of lung field: Secondary | ICD-10-CM

## 2011-09-29 DIAGNOSIS — Z01812 Encounter for preprocedural laboratory examination: Secondary | ICD-10-CM | POA: Insufficient documentation

## 2011-09-29 DIAGNOSIS — J984 Other disorders of lung: Secondary | ICD-10-CM | POA: Insufficient documentation

## 2011-09-29 DIAGNOSIS — Z01810 Encounter for preprocedural cardiovascular examination: Secondary | ICD-10-CM | POA: Insufficient documentation

## 2011-09-29 DIAGNOSIS — Z01818 Encounter for other preprocedural examination: Secondary | ICD-10-CM | POA: Insufficient documentation

## 2011-09-29 LAB — CBC
MCH: 28.2 pg (ref 26.0–34.0)
MCHC: 34 g/dL (ref 30.0–36.0)
MCV: 83 fL (ref 78.0–100.0)
Platelets: 176 10*3/uL (ref 150–400)
RDW: 14.1 % (ref 11.5–15.5)

## 2011-09-29 LAB — URINALYSIS, ROUTINE W REFLEX MICROSCOPIC
Glucose, UA: NEGATIVE mg/dL
Hgb urine dipstick: NEGATIVE
Ketones, ur: NEGATIVE mg/dL
Protein, ur: NEGATIVE mg/dL
pH: 7 (ref 5.0–8.0)

## 2011-09-29 LAB — COMPREHENSIVE METABOLIC PANEL
ALT: 16 U/L (ref 0–35)
Albumin: 3.7 g/dL (ref 3.5–5.2)
Alkaline Phosphatase: 59 U/L (ref 39–117)
BUN: 17 mg/dL (ref 6–23)
Chloride: 106 mEq/L (ref 96–112)
Potassium: 4.5 mEq/L (ref 3.5–5.1)
Sodium: 142 mEq/L (ref 135–145)
Total Bilirubin: 0.4 mg/dL (ref 0.3–1.2)
Total Protein: 6.8 g/dL (ref 6.0–8.3)

## 2011-09-29 LAB — PROTIME-INR: Prothrombin Time: 19.1 seconds — ABNORMAL HIGH (ref 11.6–15.2)

## 2011-09-29 LAB — BLOOD GAS, ARTERIAL
Acid-Base Excess: 3.6 mmol/L — ABNORMAL HIGH (ref 0.0–2.0)
Bicarbonate: 27.5 mEq/L — ABNORMAL HIGH (ref 20.0–24.0)
Drawn by: 206361
FIO2: 0.21 %
O2 Saturation: 92.5 %
pCO2 arterial: 40.7 mmHg (ref 35.0–45.0)
pO2, Arterial: 62.3 mmHg — ABNORMAL LOW (ref 80.0–100.0)

## 2011-09-29 LAB — ABO/RH: ABO/RH(D): O POS

## 2011-09-29 LAB — SURGICAL PCR SCREEN
MRSA, PCR: NEGATIVE
Staphylococcus aureus: NEGATIVE

## 2011-10-01 ENCOUNTER — Encounter: Payer: Self-pay | Admitting: Thoracic Surgery

## 2011-10-01 ENCOUNTER — Other Ambulatory Visit: Payer: Self-pay | Admitting: Thoracic Surgery

## 2011-10-01 ENCOUNTER — Inpatient Hospital Stay (HOSPITAL_COMMUNITY)
Admission: RE | Admit: 2011-10-01 | Discharge: 2011-10-06 | DRG: 165 | Disposition: A | Discharge status: Home Health | Payer: Medicare HMO | Source: Ambulatory Visit | Attending: Thoracic Surgery | Admitting: Thoracic Surgery

## 2011-10-01 ENCOUNTER — Inpatient Hospital Stay (HOSPITAL_COMMUNITY): Payer: Medicare HMO

## 2011-10-01 DIAGNOSIS — C801 Malignant (primary) neoplasm, unspecified: Secondary | ICD-10-CM

## 2011-10-01 DIAGNOSIS — M129 Arthropathy, unspecified: Secondary | ICD-10-CM | POA: Diagnosis present

## 2011-10-01 DIAGNOSIS — I10 Essential (primary) hypertension: Secondary | ICD-10-CM | POA: Diagnosis present

## 2011-10-01 DIAGNOSIS — J309 Allergic rhinitis, unspecified: Secondary | ICD-10-CM | POA: Diagnosis present

## 2011-10-01 DIAGNOSIS — Z8673 Personal history of transient ischemic attack (TIA), and cerebral infarction without residual deficits: Secondary | ICD-10-CM

## 2011-10-01 DIAGNOSIS — D249 Benign neoplasm of unspecified breast: Secondary | ICD-10-CM | POA: Diagnosis present

## 2011-10-01 DIAGNOSIS — C3410 Malignant neoplasm of upper lobe, unspecified bronchus or lung: Principal | ICD-10-CM | POA: Diagnosis present

## 2011-10-01 DIAGNOSIS — K219 Gastro-esophageal reflux disease without esophagitis: Secondary | ICD-10-CM | POA: Diagnosis present

## 2011-10-01 DIAGNOSIS — Z87442 Personal history of urinary calculi: Secondary | ICD-10-CM

## 2011-10-01 DIAGNOSIS — J45909 Unspecified asthma, uncomplicated: Secondary | ICD-10-CM | POA: Diagnosis present

## 2011-10-01 DIAGNOSIS — E669 Obesity, unspecified: Secondary | ICD-10-CM | POA: Diagnosis present

## 2011-10-01 DIAGNOSIS — D381 Neoplasm of uncertain behavior of trachea, bronchus and lung: Secondary | ICD-10-CM

## 2011-10-01 DIAGNOSIS — Z79899 Other long term (current) drug therapy: Secondary | ICD-10-CM

## 2011-10-01 DIAGNOSIS — E785 Hyperlipidemia, unspecified: Secondary | ICD-10-CM | POA: Diagnosis present

## 2011-10-01 DIAGNOSIS — I4891 Unspecified atrial fibrillation: Secondary | ICD-10-CM | POA: Diagnosis present

## 2011-10-01 DIAGNOSIS — Z87891 Personal history of nicotine dependence: Secondary | ICD-10-CM

## 2011-10-01 DIAGNOSIS — Z7901 Long term (current) use of anticoagulants: Secondary | ICD-10-CM

## 2011-10-01 HISTORY — DX: Malignant (primary) neoplasm, unspecified: C80.1

## 2011-10-02 ENCOUNTER — Inpatient Hospital Stay (HOSPITAL_COMMUNITY): Payer: Medicare HMO

## 2011-10-02 LAB — POCT I-STAT 3, ART BLOOD GAS (G3+)
Acid-Base Excess: 2 mmol/L (ref 0.0–2.0)
Bicarbonate: 27.7 mEq/L — ABNORMAL HIGH (ref 20.0–24.0)
O2 Saturation: 98 %
Patient temperature: 98.5
TCO2: 29 mmol/L (ref 0–100)
pO2, Arterial: 108 mmHg — ABNORMAL HIGH (ref 80.0–100.0)

## 2011-10-02 LAB — BASIC METABOLIC PANEL
BUN: 15 mg/dL (ref 6–23)
Calcium: 8.1 mg/dL — ABNORMAL LOW (ref 8.4–10.5)
Chloride: 98 mEq/L (ref 96–112)
Creatinine, Ser: <0.47 mg/dL — ABNORMAL LOW (ref 0.50–1.10)

## 2011-10-02 LAB — CBC
HCT: 34.9 % — ABNORMAL LOW (ref 36.0–46.0)
MCH: 27.7 pg (ref 26.0–34.0)
MCV: 84.1 fL (ref 78.0–100.0)
RDW: 14.4 % (ref 11.5–15.5)
WBC: 10.9 10*3/uL — ABNORMAL HIGH (ref 4.0–10.5)

## 2011-10-03 ENCOUNTER — Inpatient Hospital Stay (HOSPITAL_COMMUNITY): Payer: Medicare HMO

## 2011-10-03 LAB — CBC
HCT: 34.4 % — ABNORMAL LOW (ref 36.0–46.0)
MCHC: 33.1 g/dL (ref 30.0–36.0)
MCV: 84.1 fL (ref 78.0–100.0)
Platelets: 158 10*3/uL (ref 150–400)
RDW: 14.6 % (ref 11.5–15.5)

## 2011-10-03 LAB — COMPREHENSIVE METABOLIC PANEL
CO2: 32 mEq/L (ref 19–32)
Calcium: 8.7 mg/dL (ref 8.4–10.5)
Creatinine, Ser: 0.48 mg/dL — ABNORMAL LOW (ref 0.50–1.10)
GFR calc Af Amer: >90 mL/min (ref >90)
GFR calc non Af Amer: >90 mL/min (ref >90)
Glucose, Bld: 128 mg/dL — ABNORMAL HIGH (ref 70–99)

## 2011-10-03 LAB — TYPE AND SCREEN
ABO/RH(D): O POS
Unit division: 0

## 2011-10-04 ENCOUNTER — Inpatient Hospital Stay (HOSPITAL_COMMUNITY): Payer: Medicare HMO

## 2011-10-04 LAB — CBC
Hemoglobin: 10.5 g/dL — ABNORMAL LOW (ref 12.0–15.0)
RBC: 3.73 MIL/uL — ABNORMAL LOW (ref 3.87–5.11)
WBC: 9.6 10*3/uL (ref 4.0–10.5)

## 2011-10-04 LAB — BASIC METABOLIC PANEL
CO2: 31 mEq/L (ref 19–32)
Calcium: 8.1 mg/dL — ABNORMAL LOW (ref 8.4–10.5)
Creatinine, Ser: 0.5 mg/dL (ref 0.50–1.10)
GFR calc non Af Amer: >90 mL/min (ref >90)
Glucose, Bld: 114 mg/dL — ABNORMAL HIGH (ref 70–99)
Sodium: 138 mEq/L (ref 135–145)

## 2011-10-04 LAB — PROTIME-INR: INR: 1.6 — ABNORMAL HIGH (ref 0.00–1.49)

## 2011-10-05 ENCOUNTER — Inpatient Hospital Stay (HOSPITAL_COMMUNITY): Payer: Medicare HMO

## 2011-10-05 LAB — PROTIME-INR: Prothrombin Time: 19.7 seconds — ABNORMAL HIGH (ref 11.6–15.2)

## 2011-10-06 ENCOUNTER — Inpatient Hospital Stay (HOSPITAL_COMMUNITY): Payer: Medicare HMO

## 2011-10-07 NOTE — Op Note (Signed)
NAME:  MAIKO, SALAIS NO.:  [id number]  MEDICAL RECORD NO.:  [id number]  LOCATION:  2315                         FACILITY:  [facility]  PHYSICIAN:  Ines Bloomer, M.D. DATE OF BIRTH:  01-10-1941  DATE OF PROCEDURE:  10/01/2011 DATE OF DISCHARGE:                              OPERATIVE REPORT   PREOPERATIVE DIAGNOSIS:  Left upper lobe mass or nodule, positive on PET.  POSTOPERATIVE DIAGNOSES:  Adenocarcinoma of the left upper lobe.  OPERATION PERFORMED:  Left video-assisted thoracoscopic surgery, anterior mini thoracotomy, wedge resection of left upper lobe lesion with node sampling.  SURGEON:  Ines Bloomer, MD  FIRST ASSISTANT:  Coral Ceo, PA-C  ANESTHESIA:  General anesthesia.  DESCRIPTION OF PROCEDURE:  After percutaneous insertion of all monitoring lines, the patient underwent general anesthesia.  She was prepped and draped in usual sterile manner.  The patient was turned to the left lateral thoracotomy position.  Two trocar sites were made in the anterior and posterior axillary line at the sixth and seventh intercostal space with the sixth intercostal space being at the anterior axillary line.  Two trocars were inserted.  The lung was not adequately deflated and we had difficulty seeing with the scope because of the lung, so we proceeded to do an anterior incision over the fifth intercostal space splitting the serratus and entering the fifth intercostal space and putting TPA in the space.  We saw a trouble with the tube and could not get the lung completely deflated but we were able to find the lesion in the anterior segment of left upper lobe.  We freed up the left upper lobe from the mediastinum where there were some adhesions with electrocautery as well as sharp dissection.  After that had been done, we then started with the first purple and then switched to the black stapler to do a wedge resection.  Since the lesion was close to the  hilum, it was very difficult to get margins around it but we were able to obtain margin.  We had to oversew several areas of this staple line with 3-0 Vicryl.  After that had been done, we still were worried about a margin on one side of the staple line and so we took a secondary margin and marked that for pathological evaluation.  Frozen section did reveal adenocarcinoma and the margins were negative but very close.  We then this took attention to the lung and dissected out three areas of three 25 nodes and one 10L node and a #6 node from the aortopulmonary window as well as around the superior pulmonary vein and bronchus.  All bleeding was electrocoagulated.  ProGel was applied to the staple line.  Two chest tubes were brought into the trocar sites and sutured in place with 0-silk.  A single On-Q was inserted in the usual fashion.  The patient was closed with 3 pericostals, #1 Vicryl in the muscle layer, 2-0 Vicryl in the subcutaneous tissue, and Ethicon skin clips.  The patient returned to the recovery room in stable condition.     Ines Bloomer, M.D.     DPB/MEDQ  D:  10/01/2011  T:  10/01/2011  Job:  [id number]  Electronically Signed by Jovita Gamma M.D. on 10/07/2011 04:53:26 PM

## 2011-10-13 ENCOUNTER — Encounter: Payer: Self-pay | Admitting: *Deleted

## 2011-10-13 ENCOUNTER — Other Ambulatory Visit: Payer: Self-pay | Admitting: Thoracic Surgery

## 2011-10-13 DIAGNOSIS — D381 Neoplasm of uncertain behavior of trachea, bronchus and lung: Secondary | ICD-10-CM

## 2011-10-14 ENCOUNTER — Encounter: Payer: Self-pay | Admitting: Thoracic Surgery

## 2011-10-14 ENCOUNTER — Other Ambulatory Visit: Payer: Self-pay

## 2011-10-14 ENCOUNTER — Ambulatory Visit (INDEPENDENT_AMBULATORY_CARE_PROVIDER_SITE_OTHER): Payer: Self-pay | Admitting: Thoracic Surgery

## 2011-10-14 ENCOUNTER — Ambulatory Visit
Admission: RE | Admit: 2011-10-14 | Discharge: 2011-10-14 | Disposition: A | Discharge status: Home / Self Care | Payer: Medicare HMO | Source: Ambulatory Visit | Attending: Thoracic Surgery | Admitting: Thoracic Surgery

## 2011-10-14 VITALS — BP 115/75 | HR 72 | Resp 20 | Ht 71.0 in | Wt 275.0 lb

## 2011-10-14 DIAGNOSIS — D381 Neoplasm of uncertain behavior of trachea, bronchus and lung: Secondary | ICD-10-CM

## 2011-10-14 DIAGNOSIS — C3430 Malignant neoplasm of lower lobe, unspecified bronchus or lung: Secondary | ICD-10-CM

## 2011-10-14 NOTE — Telephone Encounter (Signed)
Current Rx is for oxycodone 5 mg, given at hospital discharge. She is requesting something not as strong. RX for Vicodin 7.5.500 mg po every 4-6 hours prn/pain #40 no refill called to pharmacy.

## 2011-10-14 NOTE — Progress Notes (Signed)
HPI patient returns for followup. Her cancer in the left upper lobe is adenocarcinoma. All nodes were negative she was to use a pathological stage IA. We still we'll have to wear about 3 lesions on the right side I will refer her to Dr. Gwenyth Bouillon for evaluation. Her ALK studies were negative. Incisions are well-healed removed her chest tube sutures chest x-ray shows further improvement in aeration. 6 we will see her back again in 2 week.   Current Outpatient Prescriptions  Medication Sig Dispense Refill  . acetaminophen (TYLENOL) 500 MG tablet Take 500 mg by mouth as directed.        Marland Kitchen albuterol-ipratropium (COMBIVENT) 18-103 MCG/ACT inhaler Inhale 2 puffs into the lungs 2 (two) times daily.        . Calcium Citrate-Vitamin D (CALCIUM CITRATE + D) 300-100 MG-UNIT TABS Take 0.5 tablets by mouth 2 (two) times daily.        . cholecalciferol (VITAMIN D) 1000 UNITS tablet Take 1,000 Units by mouth 2 (two) times daily.       . diphenhydrAMINE (SOMINEX) 25 MG tablet Take 25 mg by mouth 2 (two) times daily.        . fish oil-omega-3 fatty acids 1000 MG capsule Take 1 g by mouth daily.       . Flaxseed, Linseed, (FLAX SEED OIL PO) Take by mouth.        . furosemide (LASIX) 20 MG tablet Take 20 mg by mouth as needed.        . Garlic 1000 MG CAPS Take by mouth 2 (two) times daily.        . hydrochlorothiazide 25 MG tablet Take 25 mg by mouth daily.        . metoprolol (TOPROL-XL) 100 MG 24 hr tablet Take 100 mg by mouth daily.        . Multiple Vitamin (MULTIVITAMIN) tablet Take 0.5 tablets by mouth daily.        Marland Kitchen oxyCODONE (OXY IR/ROXICODONE) 5 MG immediate release tablet 5 mg every 6 (six) hours as needed.       . simvastatin (ZOCOR) 10 MG tablet Take 10 mg by mouth at bedtime.        Marland Kitchen warfarin (COUMADIN) 5 MG tablet Take 5 mg by mouth daily.           Review of Systems: Unchanged   Physical Exam  Constitutional: She appears well-developed and well-nourished.  Cardiovascular: Normal rate,  regular rhythm and normal heart sounds.   Pulmonary/Chest: Effort normal and breath sounds normal.     Diagnostic Tests: Chest x-ray shows normal postoperative changes   Impression: Stage IA adenocarcinoma left upper lobe status post resection pulmonary nodules right upper lobe right lower   Plan: Return in 2 weeks

## 2011-10-15 ENCOUNTER — Encounter: Payer: Self-pay | Admitting: *Deleted

## 2011-10-17 NOTE — Discharge Summary (Signed)
Tammie [name], [name] NO.:  [id number]  MEDICAL RECORD NO.:  [id number]  LOCATION:  2016                         FACILITY:  [facility]  PHYSICIAN:  Ines Bloomer, M.D. DATE OF BIRTH:  [birth year]  DATE OF ADMISSION:  10/01/2011 DATE OF DISCHARGE:  10/06/2011                              DISCHARGE SUMMARY   PRIMARY ADMITTING DIAGNOSIS:  Left upper lobe lung mass.  ADDITIONAL/DISCHARGE DIAGNOSES: 1. 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 2. Hypertension. 3. History of cerebrovascular accident. 4. History of atrial fibrillation. 5. History of fibrocystic breast disease. 6. Hyperlipidemia. 7. Obesity. 8. Hypertension. 9. Arthritis. 10.History of nephrolithiasis. 11.Seasonal allergic rhinitis.  PROCEDURES PERFORMED:  Left VATS, left mini thoracotomy with a left upper lobe wedge resection and lymph node dissection.  HISTORY:  The patient is a 70 year old female who is recently referred to Dr. Edwyna Shell for evaluation of left upper lobe lung mass.  She has a history of pulmonary nodules which has been followed by Dr. Shelle Iron. Over time, serial CT scans have shown gradual increase in the size of the nodules.  She has previously undergone an electromagnetic navigation bronchoscopies with nondiagnostic biopsies.  Her most recent CT scan in September 2012 showed a left upper lobe nodule measuring 1.5 x 0.8 cm with right upper lobe mass of 1.3 x 1.1.  It was Dr. Scheryl Darter opinion that in light of the recent increase in size of the lesions that she should undergo a PET scan and this was performed on September 22, 2011, which showed mild hypermetabolic activity in the left upper lobe with an SUV of 3.2.  She returned to the office for follow up and was felt that she should undergo a left VATS with either segmentectomy or wedge resection at this time for diagnosis.  All risks, benefits, and alternatives of the surgery were  explained to the patient she agreed to proceed.  HOSPITAL COURSE:  Tammie [name] was admitted to [facility] on October 01, 2011,  and was taken to the operating room where she underwent left VATS with left upper lobe wedge resection by Dr. Edwyna Shell. Please see previously dictated operative report for complete details of surgery.  Her intraoperative pathology was positive for invasive moderately differentiated adenocarcinoma (T1A, N0).  The patient tolerated the procedure well and was transferred to the SICU for further postoperative convalescence.  Her postoperative course has been uneventful.  Her chest tubes have been removed in the standard fashion and her followup chest x-ray is stable with no pneumothorax.  She has remained afebrile and her vital signs have been stable.  She has been restarted on Coumadin which she takes on a chronic basis for atrial fibrillation.  At the present, her INR is 1.64 with a PT of 19.7.  The remainder of her labs have remained stable with sodium 138, potassium 3.7, BUN 14, creatinine 0.5.  Hemoglobin 10.5 hematocrit 31.9, white count 9.6, and platelets 160.  Her incisions are all healing well.  She is ambulating in the halls without difficulty and she has been weaned off supplemental oxygen.  She is tolerating a regular diet and is having normal  bowel and bladder function.  She has been transferred to the floor and is overall stable and ready for discharge within the next 24 hours.  She will undergo a PA and lateral chest x-ray on the morning of October 06, 2011, and hopefully if she has remained stable.  She will be ready for discharge at that time.  DISCHARGE MEDICATIONS:  As follows: 1. Oxycodone IR 5-10 mg q.6 h. p.r.n. pain. 2. Albuterol 2 puffs q.4 hours p.r.n. shortness of breath. 3. Hydrochlorothiazide 12.5 mg daily. 4. Atrovent 2 puffs b.i.d. p.r.n. 5. Metoprolol 100 mg b.i.d. 6. Simvastatin 10 mg daily. 7. Coumadin 5 mg on Tuesday and  Saturday and 2.5 mg all other days.  DISCHARGE INSTRUCTIONS:  She is asked to refrain from driving, heavy lifting, or strenuous activity.  She may continue ambulating daily and using her incentive spirometer.  She may shower daily and clean her incisions with soap and water.  She will continue her same preoperative diet.  DISCHARGE FOLLOW UP:  She will return to the office and see Dr. Edwyna Shell on October 14, 2011, with chest x-ray from [facility].  She will need to have a Coumadin follow up within the next 48-72 hours.  She will also follow up as directed with Dr. Shelle Iron.  If she experiences any problems or has questions post discharge, she is asked to contact our office immediately.     Coral Ceo, P.A.   ______________________________ Ines Bloomer, M.D.    GC/MEDQ  D:  10/05/2011  T:  10/05/2011  Job:  [id number]  cc:   Barbaraann Share, MD,FCCP [facility]  Electronically Signed by Weldon Inches. on 10/13/2011 10:39:00 AM Electronically Signed by Jovita Gamma M.D. on 10/17/2011 09:09:50 PM

## 2011-10-26 ENCOUNTER — Encounter: Payer: Medicare HMO | Admitting: Internal Medicine

## 2011-10-27 ENCOUNTER — Other Ambulatory Visit: Payer: Self-pay | Admitting: Internal Medicine

## 2011-10-27 ENCOUNTER — Encounter (HOSPITAL_BASED_OUTPATIENT_CLINIC_OR_DEPARTMENT_OTHER): Payer: Medicare HMO | Admitting: Internal Medicine

## 2011-10-27 ENCOUNTER — Other Ambulatory Visit: Payer: Self-pay | Admitting: Thoracic Surgery

## 2011-10-27 DIAGNOSIS — C3490 Malignant neoplasm of unspecified part of unspecified bronchus or lung: Secondary | ICD-10-CM

## 2011-10-27 DIAGNOSIS — Z87891 Personal history of nicotine dependence: Secondary | ICD-10-CM

## 2011-10-27 DIAGNOSIS — D381 Neoplasm of uncertain behavior of trachea, bronchus and lung: Secondary | ICD-10-CM

## 2011-10-27 LAB — CBC WITH DIFFERENTIAL/PLATELET
Basophils Absolute: 0 10*3/uL (ref 0.0–0.1)
Eosinophils Absolute: 0.2 10*3/uL (ref 0.0–0.5)
HCT: 42.4 % (ref 34.8–46.6)
LYMPH%: 23.9 % (ref 14.0–49.7)
MCV: 84.3 fL (ref 79.5–101.0)
MONO#: 0.4 10*3/uL (ref 0.1–0.9)
MONO%: 7.4 % (ref 0.0–14.0)
NEUT#: 3.6 10*3/uL (ref 1.5–6.5)
NEUT%: 64.5 % (ref 38.4–76.8)
Platelets: 240 10*3/uL (ref 145–400)
WBC: 5.5 10*3/uL (ref 3.9–10.3)

## 2011-10-27 LAB — COMPREHENSIVE METABOLIC PANEL
BUN: 19 mg/dL (ref 6–23)
CO2: 28 mEq/L (ref 19–32)
Creatinine, Ser: 0.74 mg/dL (ref 0.50–1.10)
Glucose, Bld: 100 mg/dL — ABNORMAL HIGH (ref 70–99)
Total Bilirubin: 0.5 mg/dL (ref 0.3–1.2)
Total Protein: 6.6 g/dL (ref 6.0–8.3)

## 2011-10-29 IMAGING — CT CT ABD-PELV W/O CM
2 of 4 series · 17 of 46 positions shown, 19 images · non-contrast
Comparison: None.

CLINICAL DATA: Severe right flank pain and hematuria for 2 days.
History of kidney stones.  Prior hysterectomy.

CT ABDOMEN AND PELVIS WITHOUT CONTRAST
TECHNIQUE: Multidetector CT imaging of the abdomen and pelvis was
performed following the standard protocol without intravenous
contrast.

[Series 2: standard/full over (age)lbs 5.0 · axial · 0.85mm/px · z∈[-489,-39]mm · 14 of 98 slices shown, 16 images]
[im 4/98  soft-tissue]
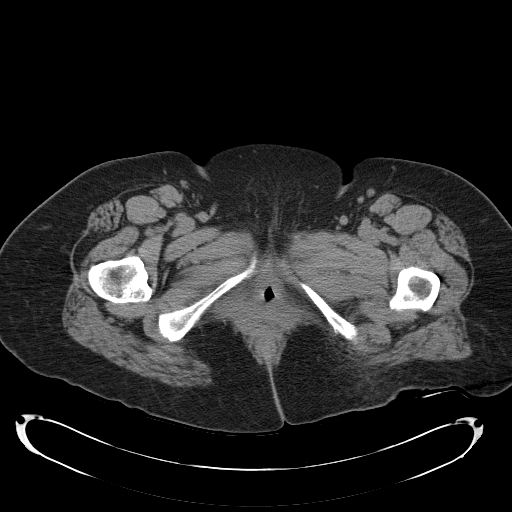
[im 4/98  bone]
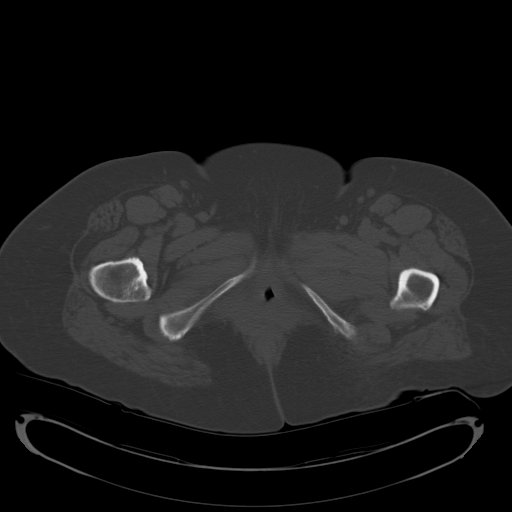
[im 12/98  soft-tissue]
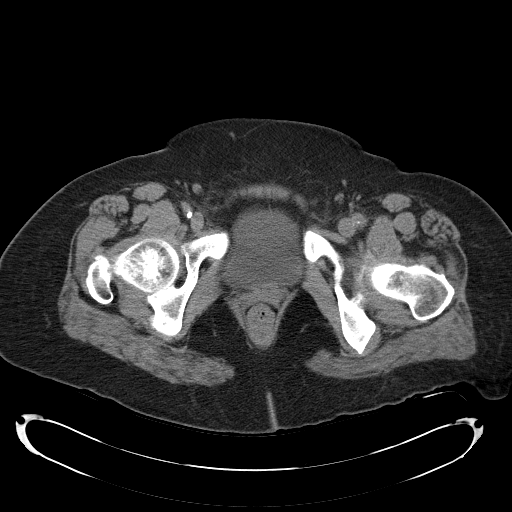
[im 20/98  soft-tissue]
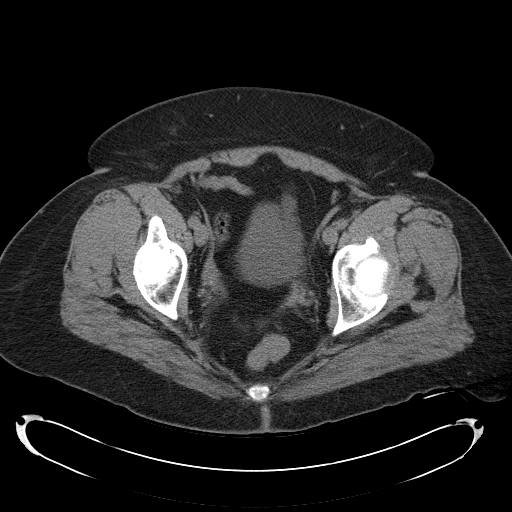
[im 28/98  soft-tissue]
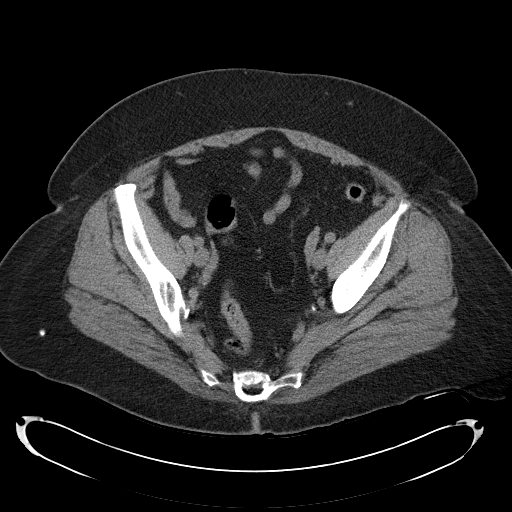
[im 32/98  soft-tissue]
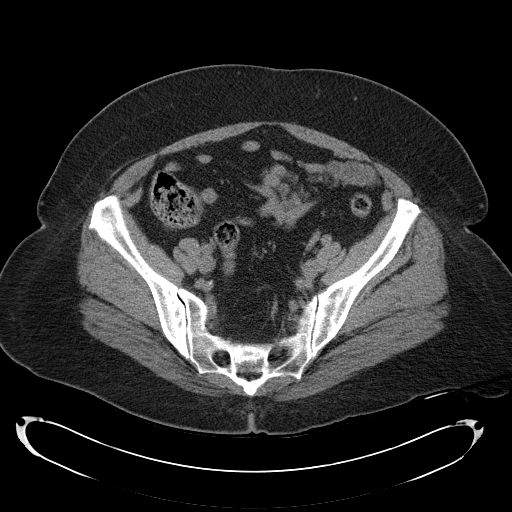
[im 39/98  soft-tissue]
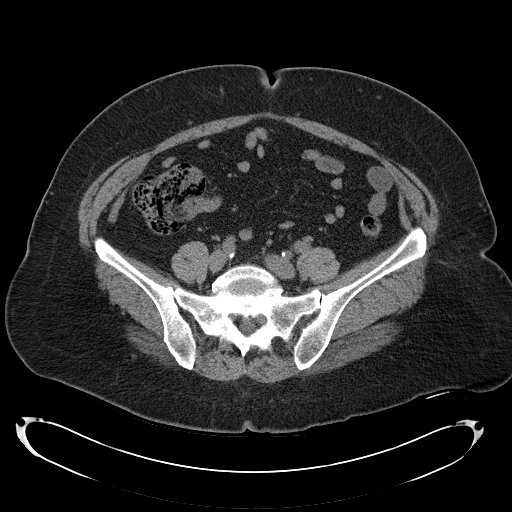
[im 47/98  soft-tissue]
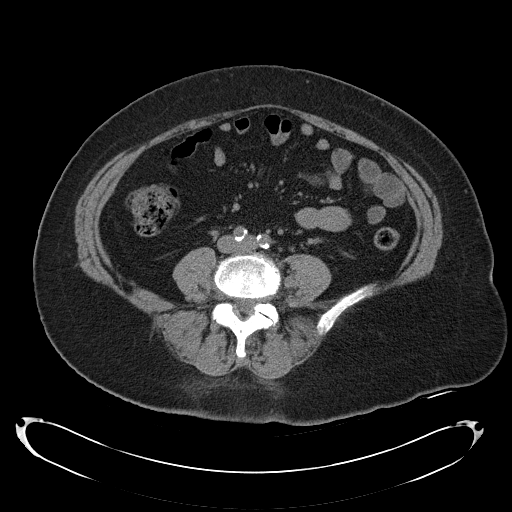
[im 51/98  soft-tissue]
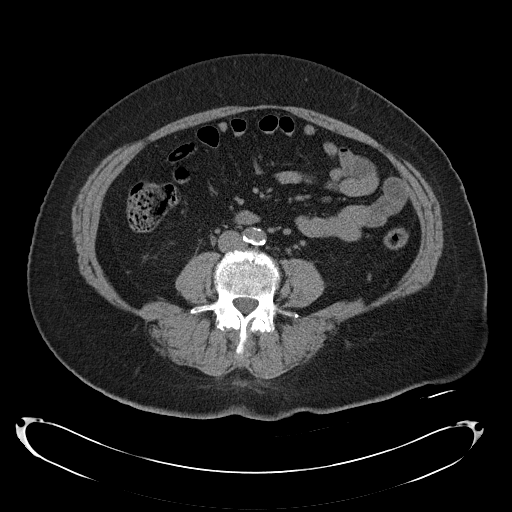
[im 59/98  soft-tissue]
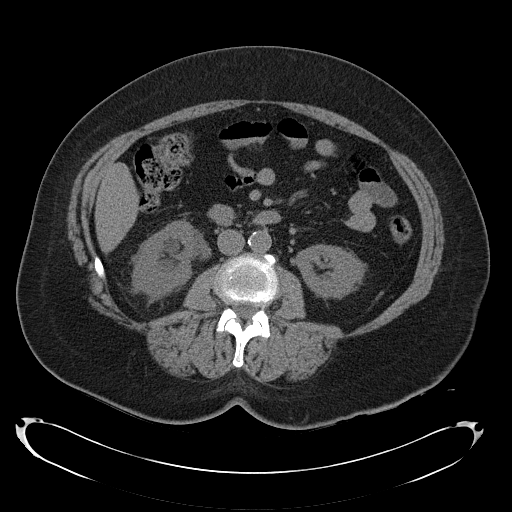
[im 59/98  bone]
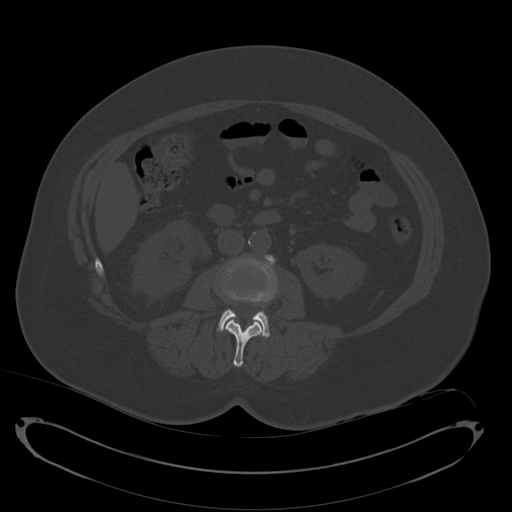
[im 66/98  soft-tissue]
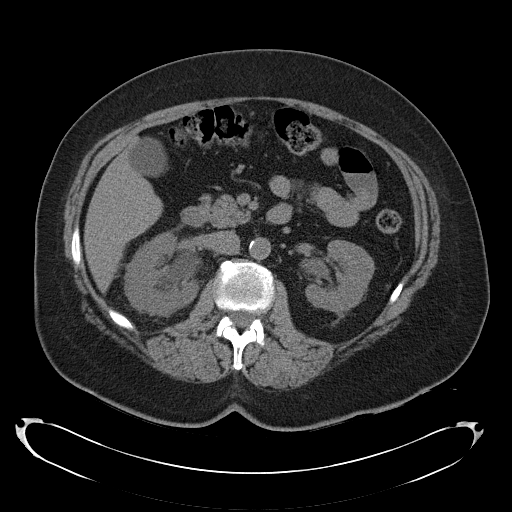
[im 74/98  soft-tissue]
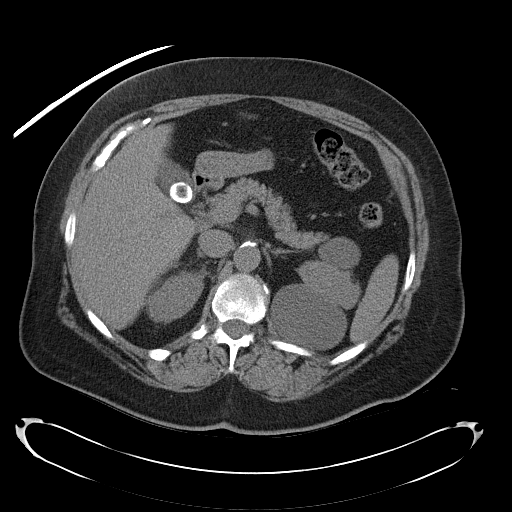
[im 78/98  soft-tissue]
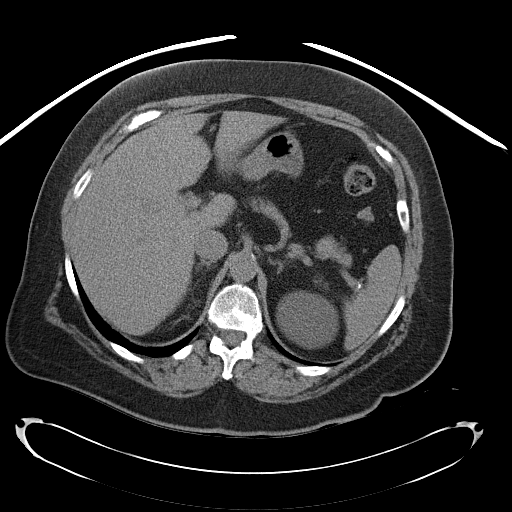
[im 86/98  soft-tissue]
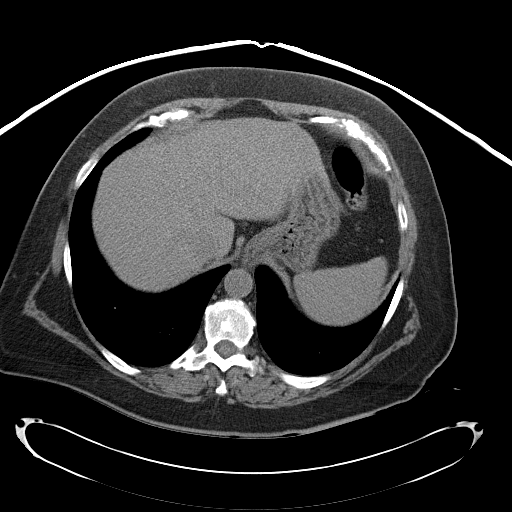
[im 94/98  soft-tissue]
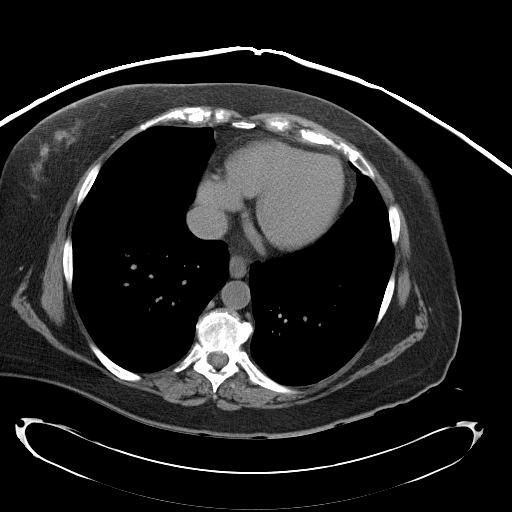

[Series 4: mpr coronal · coronal · 0.90mm/px · 3 of 89 slices shown]
[im 30/89  soft-tissue]
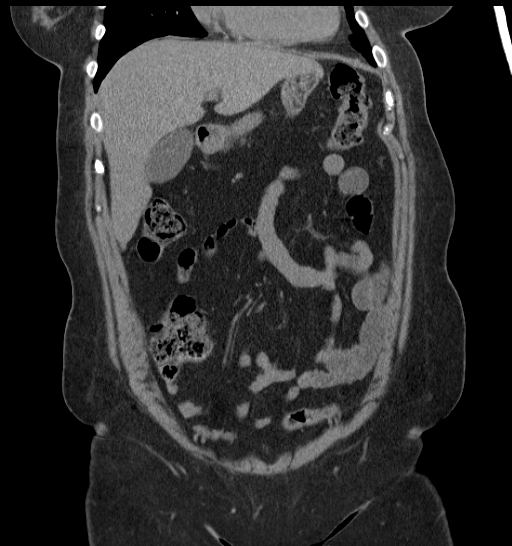
[im 40/89  soft-tissue]
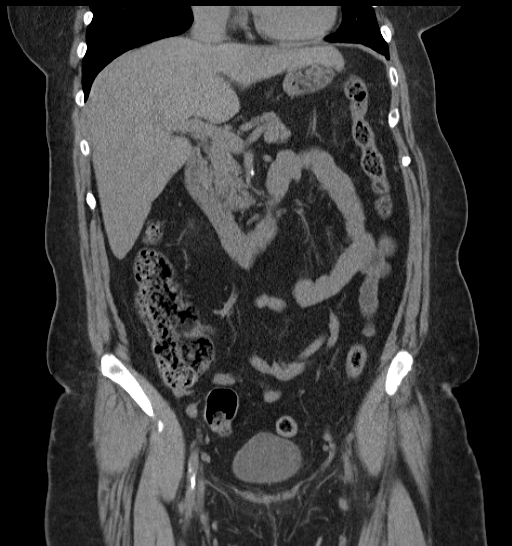
[im 49/89  soft-tissue]
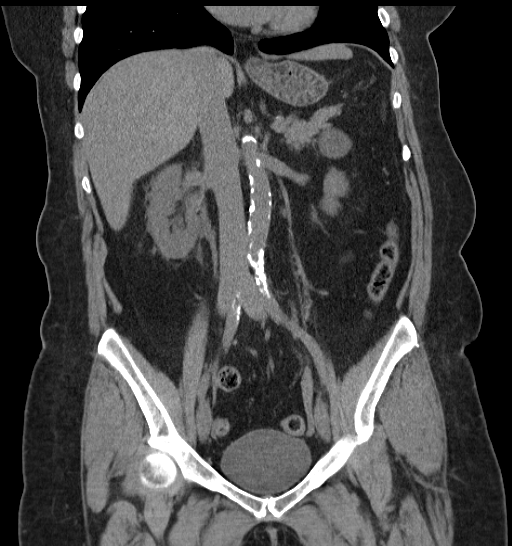

[17 of 46 positions shown; findings below may reference images not displayed]

FINDINGS: There is a 12 x 9 mm stone in the right ureteral pelvic
junction causing right hydronephrosis.  There are no other renal
calculi.

There is a 6.7 cm cyst on the posterior aspect of the upper pole of
the left kidney and a 3.6 cm cyst on the anterior aspect of the
upper pole.  There is what is probably a small angiomyolipoma in
the upper pole on the left measuring 14 mm in diameter.

The spleen, pancreas, adrenal glands, and liver are normal.  There
is a 2.0 cm stone in the nondistended gallbladder.  There is no
biliary ductal dilatation.

The large and small bowel is normal including the terminal ileum
and appendix.  There is no diverticular disease.

The ovaries are not enlarged.  No significant bony abnormalities.
IMPRESSION: 1. 12 x 9 mm stone obstructing the ureteropelvic junction of the
right renal collecting system with moderate right hydronephrosis.

2.  Cholelithiasis.

## 2011-11-02 ENCOUNTER — Encounter: Payer: Self-pay | Admitting: Thoracic Surgery

## 2011-11-02 ENCOUNTER — Ambulatory Visit
Admission: RE | Admit: 2011-11-02 | Discharge: 2011-11-02 | Disposition: A | Discharge status: Home / Self Care | Payer: Medicare HMO | Source: Ambulatory Visit | Attending: Thoracic Surgery | Admitting: Thoracic Surgery

## 2011-11-02 ENCOUNTER — Ambulatory Visit (INDEPENDENT_AMBULATORY_CARE_PROVIDER_SITE_OTHER): Payer: Self-pay | Admitting: Thoracic Surgery

## 2011-11-02 VITALS — BP 157/89 | HR 90 | Resp 20 | Ht 71.0 in | Wt 275.0 lb

## 2011-11-02 DIAGNOSIS — C3430 Malignant neoplasm of lower lobe, unspecified bronchus or lung: Secondary | ICD-10-CM

## 2011-11-02 DIAGNOSIS — D381 Neoplasm of uncertain behavior of trachea, bronchus and lung: Secondary | ICD-10-CM

## 2011-11-02 NOTE — Progress Notes (Signed)
HPI patient returns today after her incisions are well-healed chest x-ray showed normal postoperative changes she is seeing Dr. Gwenyth Bouillon. He will plan to have a CT scan in 6 months we will see her again in 2 months. She has some mild postthoracotomy pain but is slowly improving.  Current Outpatient Prescriptions  Medication Sig Dispense Refill  . acetaminophen (TYLENOL) 500 MG tablet Take 500 mg by mouth as directed.        . Calcium Citrate-Vitamin D (CALCIUM CITRATE + D) 300-100 MG-UNIT TABS Take 0.5 tablets by mouth 2 (two) times daily.        . cholecalciferol (VITAMIN D) 1000 UNITS tablet Take 1,000 Units by mouth 2 (two) times daily.       . diphenhydrAMINE (SOMINEX) 25 MG tablet Take 25 mg by mouth 2 (two) times daily.        . fish oil-omega-3 fatty acids 1000 MG capsule Take 1 g by mouth daily.       . Flaxseed, Linseed, (FLAX SEED OIL PO) Take by mouth.        . Garlic 1000 MG CAPS Take by mouth 2 (two) times daily.        . hydrochlorothiazide 25 MG tablet Take 25 mg by mouth daily.        . metoprolol (TOPROL-XL) 100 MG 24 hr tablet Take 100 mg by mouth daily.        . Multiple Vitamin (MULTIVITAMIN) tablet Take 0.5 tablets by mouth daily.        . simvastatin (ZOCOR) 10 MG tablet Take 10 mg by mouth at bedtime.        Marland Kitchen warfarin (COUMADIN) 5 MG tablet Take 5 mg by mouth daily.           Review of Systems:unchanged   Physical Exam  Cardiovascular:  No murmur heard. Pulmonary/Chest: Effort normal and breath sounds normal. No respiratory distress. She has no wheezes.   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

## 2011-12-10 ENCOUNTER — Other Ambulatory Visit: Payer: Self-pay | Admitting: Thoracic Surgery

## 2011-12-10 DIAGNOSIS — D381 Neoplasm of uncertain behavior of trachea, bronchus and lung: Secondary | ICD-10-CM

## 2011-12-14 ENCOUNTER — Ambulatory Visit
Admission: RE | Admit: 2011-12-14 | Discharge: 2011-12-14 | Disposition: A | Discharge status: Home / Self Care | Payer: Medicare HMO | Source: Ambulatory Visit | Attending: Thoracic Surgery | Admitting: Thoracic Surgery

## 2011-12-14 ENCOUNTER — Ambulatory Visit (INDEPENDENT_AMBULATORY_CARE_PROVIDER_SITE_OTHER): Payer: Self-pay | Admitting: Thoracic Surgery

## 2011-12-14 ENCOUNTER — Encounter: Payer: Self-pay | Admitting: Thoracic Surgery

## 2011-12-14 VITALS — BP 139/86 | HR 77 | Resp 20 | Ht 71.0 in | Wt 270.5 lb

## 2011-12-14 DIAGNOSIS — Z09 Encounter for follow-up examination after completed treatment for conditions other than malignant neoplasm: Secondary | ICD-10-CM

## 2011-12-14 DIAGNOSIS — C3410 Malignant neoplasm of upper lobe, unspecified bronchus or lung: Secondary | ICD-10-CM

## 2011-12-14 DIAGNOSIS — D381 Neoplasm of uncertain behavior of trachea, bronchus and lung: Secondary | ICD-10-CM

## 2011-12-14 IMAGING — CR DG CHEST 2V
2 series · 2 of 2 positions shown · non-contrast
Comparison: None.

CLINICAL DATA: Preop workup; right renal calculus.

CHEST - 2 VIEW

[w chest pa]
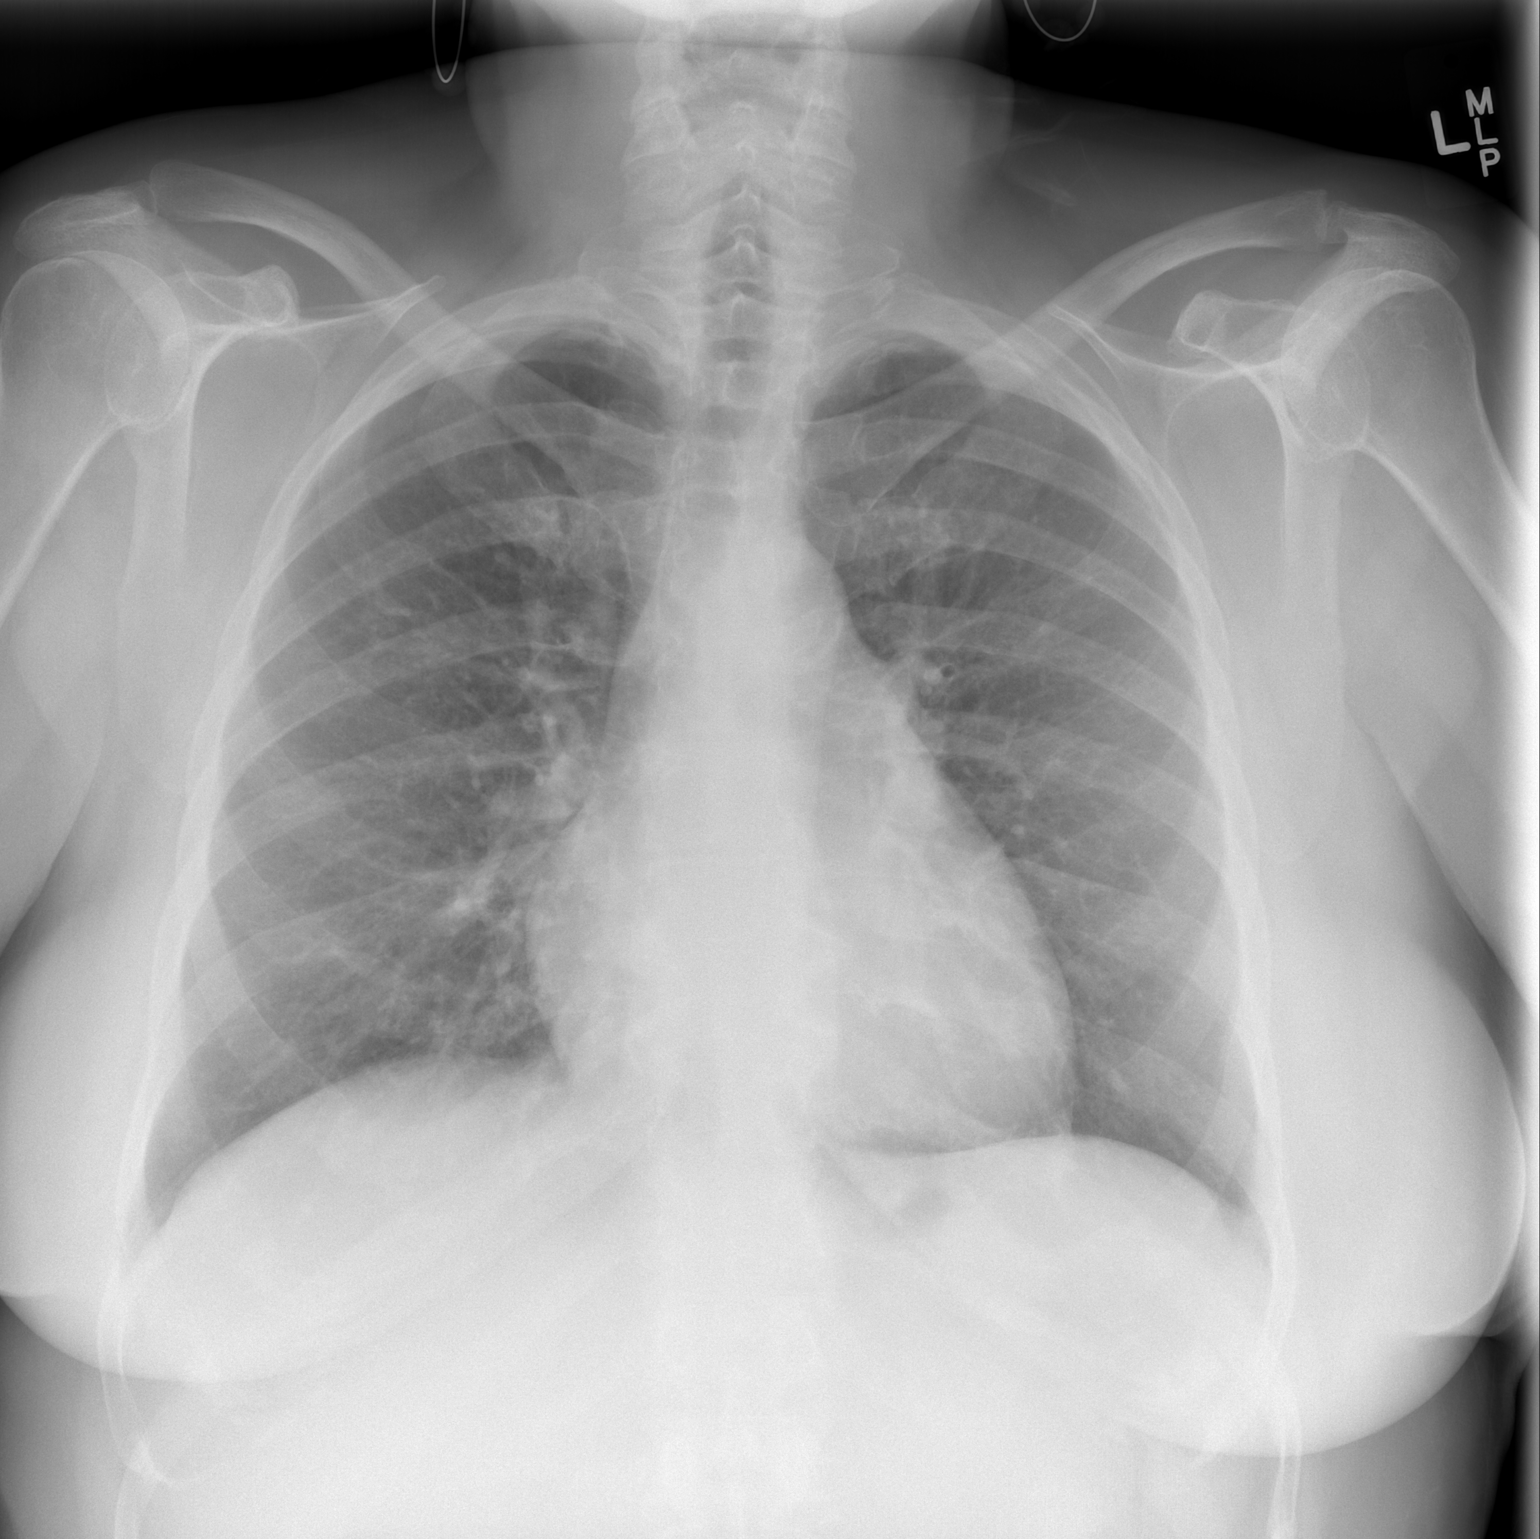

[w chest lat]
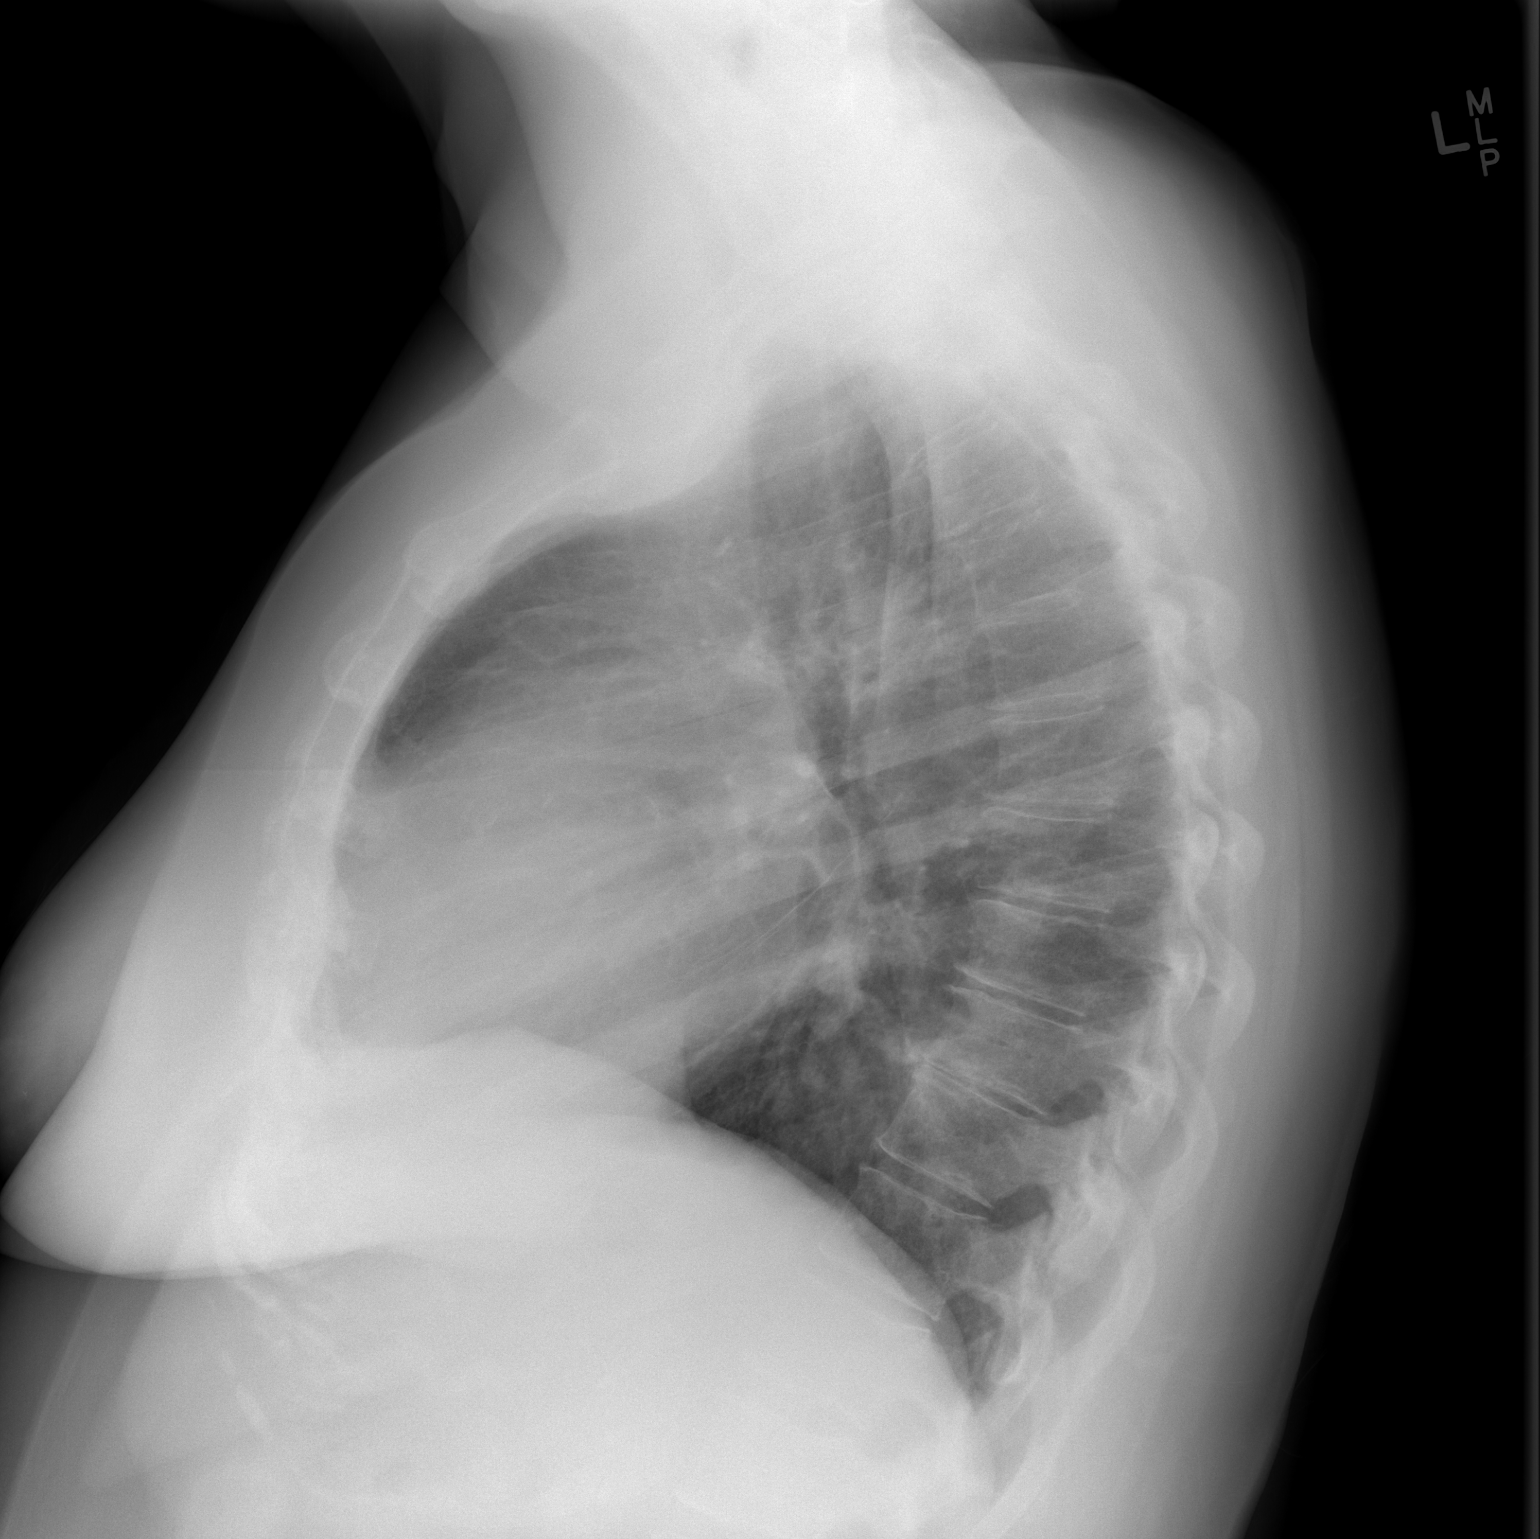

[2 of 2 positions shown; findings below may reference images not displayed]

FINDINGS: The heart size and mediastinal contours are within
normal limits.  Both lungs are clear.  The visualized skeletal
structures are unremarkable.
IMPRESSION: No active cardiopulmonary disease.

## 2011-12-14 NOTE — Progress Notes (Signed)
HPI patient returns status post TUNA half months since she had resection of a left upper lobe mildly differentiated adenocarcinoma she has some atypical and adenomatous hyperplasia. She still has a groundglass opacity on the right side which we are following she was given another CT scan in a parallel see her back at that time her symptoms are only shortness of breath. She is doing well overall.   Current Outpatient Prescriptions  Medication Sig Dispense Refill  . acetaminophen (TYLENOL) 500 MG tablet Take 500 mg by mouth as directed.        . Calcium Citrate-Vitamin D (CALCIUM CITRATE + D) 300-100 MG-UNIT TABS Take 0.5 tablets by mouth 2 (two) times daily.        . cholecalciferol (VITAMIN D) 1000 UNITS tablet Take 1,000 Units by mouth 2 (two) times daily.       . fish oil-omega-3 fatty acids 1000 MG capsule Take 1 g by mouth daily.       . Flaxseed, Linseed, (FLAX SEED OIL PO) Take by mouth.        . Garlic 1000 MG CAPS Take by mouth 2 (two) times daily.        . hydrochlorothiazide 25 MG tablet Take 12.5 mg by mouth daily.       . metoprolol (TOPROL-XL) 100 MG 24 hr tablet Take 100 mg by mouth daily.        . Multiple Vitamin (MULTIVITAMIN) tablet Take 0.5 tablets by mouth daily.        . simvastatin (ZOCOR) 10 MG tablet Take 10 mg by mouth at bedtime.        Marland Kitchen warfarin (COUMADIN) 5 MG tablet Take 5 mg by mouth daily.           Review of Systems: Unchanged   Physical Exam lungs are clear to auscultation percussion incisions are well-healed   Diagnostic Tests: Chest x-ray shows postoperative changes with still some inflammatory opacities on the left   Impression: Status post resection of stage IA adenocarcinoma left upper lobe groundglass opacities on right   Plan: Return after CT scan in April

## 2012-03-07 IMAGING — CT CT CHEST W/O CM
2 of 3 series · 15 of 36 positions shown, 18 images · non-contrast
Comparison: Abdominal CT 04/12/2010 and chest radiographs
05/28/2010.

CLINICAL DATA: Right upper lobe density on chest radiograph.
History of skin cancer and iodinated contrast allergy.

CT CHEST WITHOUT CONTRAST
TECHNIQUE: Multidetector CT imaging of the chest was performed
following the standard protocol without IV contrast.

[Series 2: routine chest · axial · 0.70mm/px · z∈[-282,-37]mm · 12 of 59 slices shown, 15 images]
[im 5/59  mediastinal]
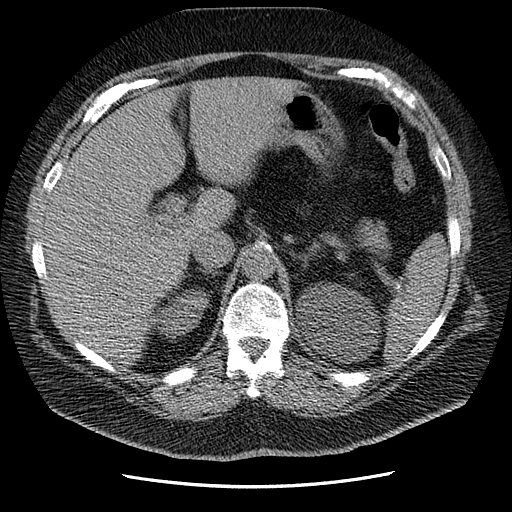
[im 5/59  lung]
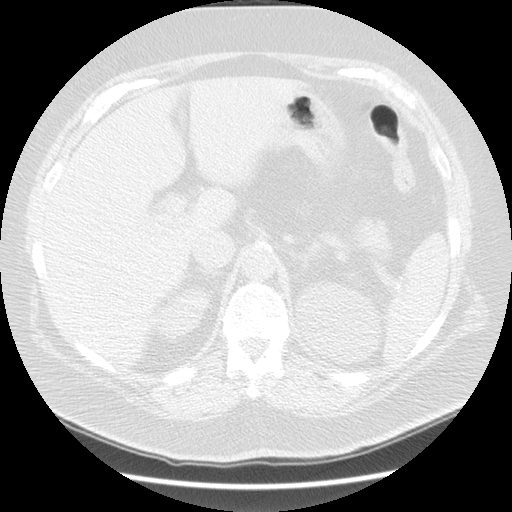
[im 9/59  lung]
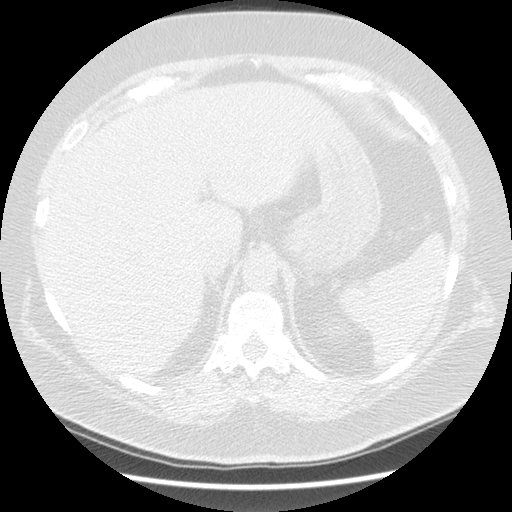
[im 13/59  lung]
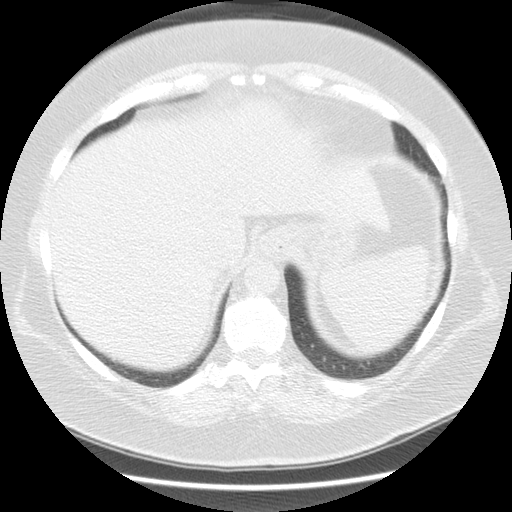
[im 18/59  lung]
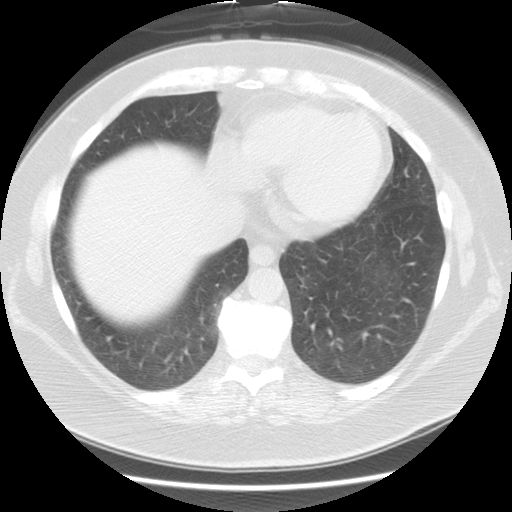
[im 22/59  mediastinal]
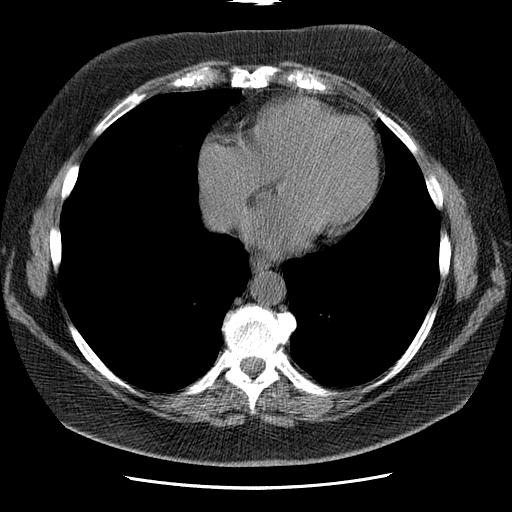
[im 22/59  lung]
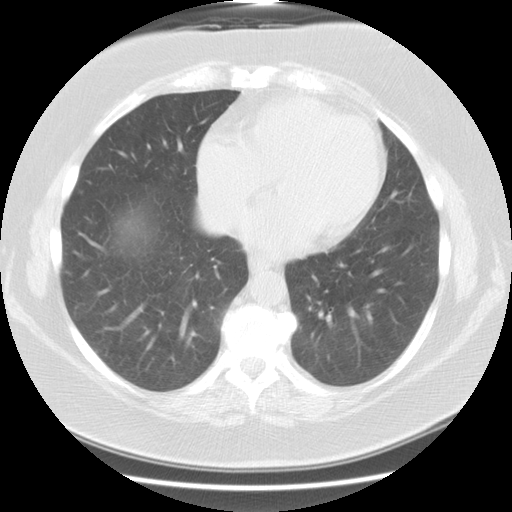
[im 26/59  lung]
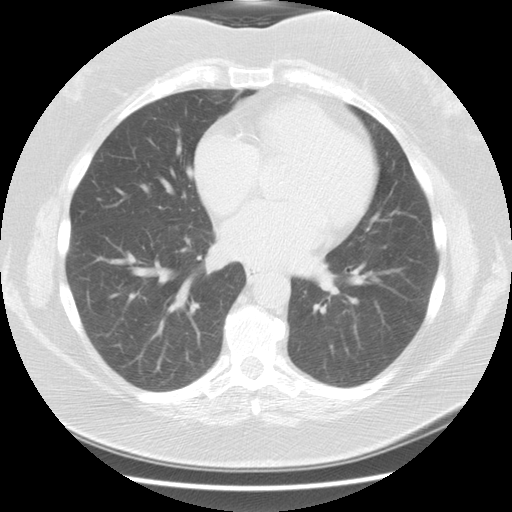
[im 33/59  lung]
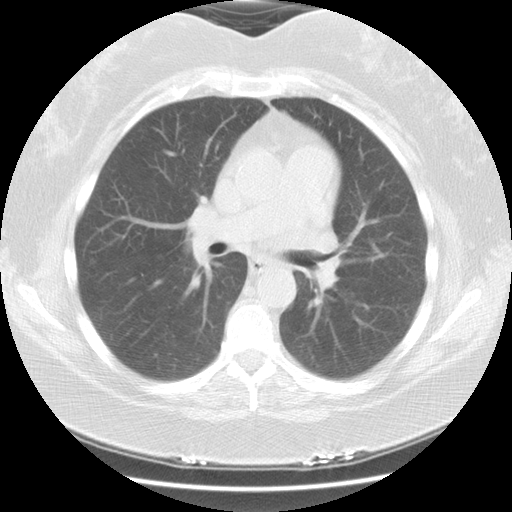
[im 37/59  lung]
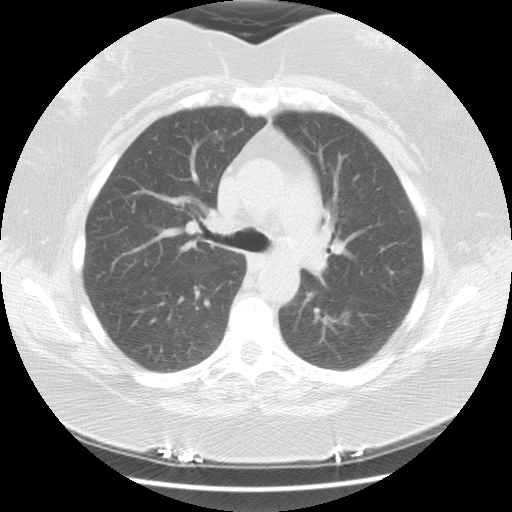
[im 41/59  mediastinal]
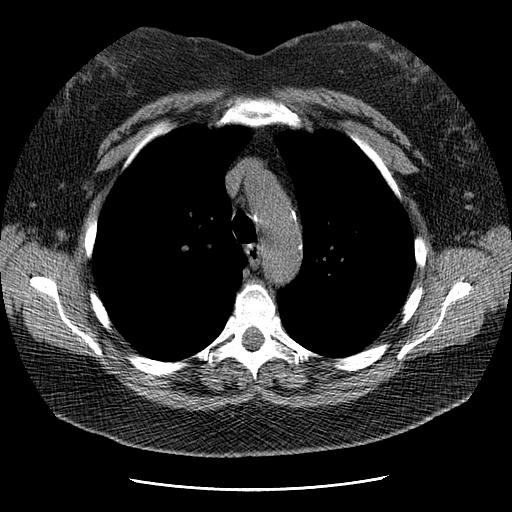
[im 41/59  lung]
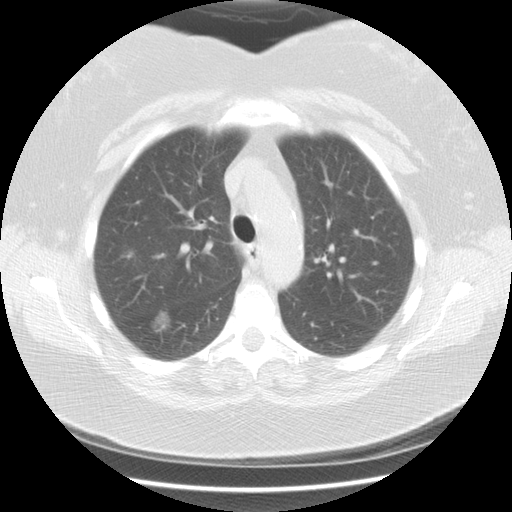
[im 46/59  lung]
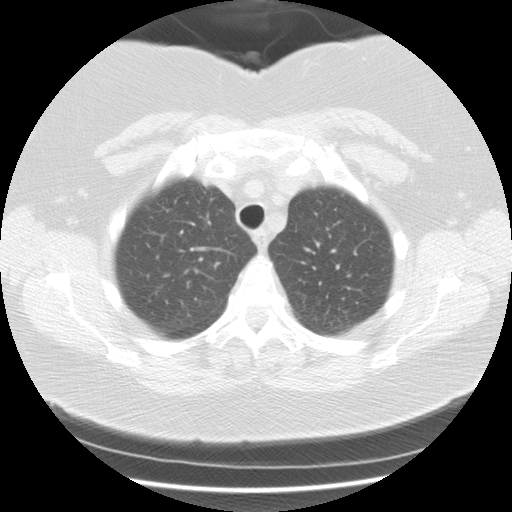
[im 50/59  lung]
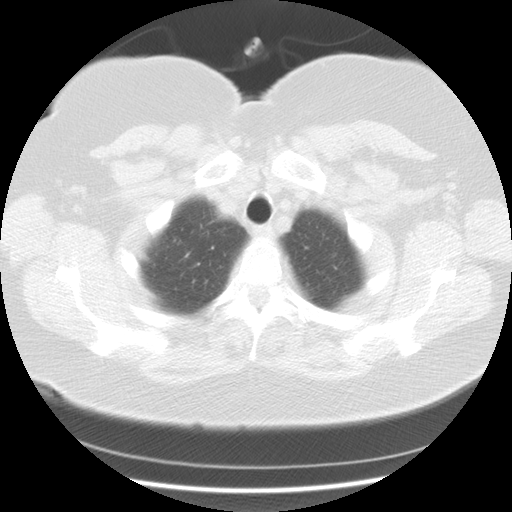
[im 54/59  lung]
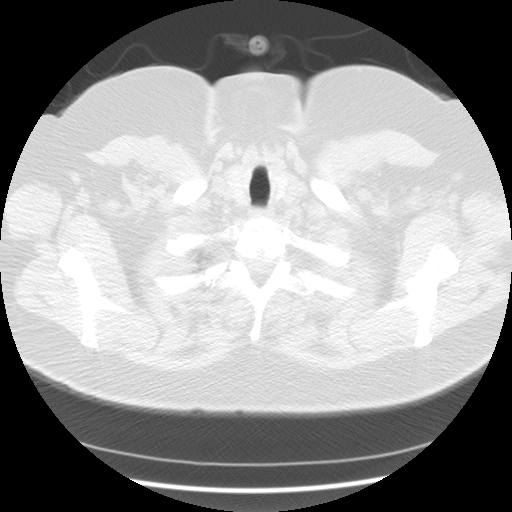

[Series 400: cor · coronal · 0.70mm/px · 3 of 126 slices shown]
[im 26/126  lung]
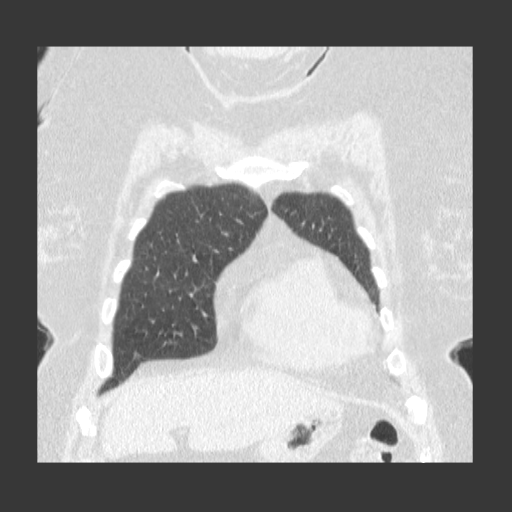
[im 51/126  lung]
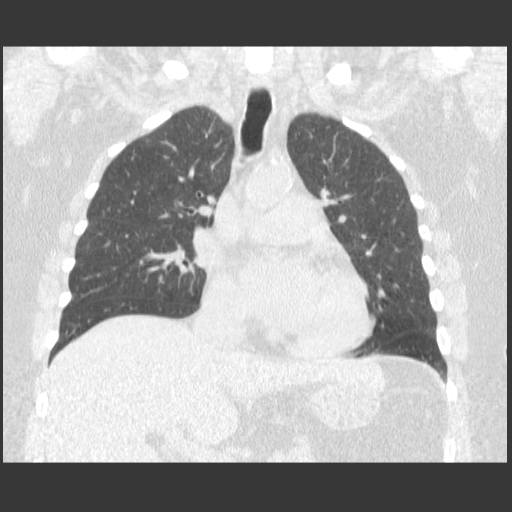
[im 76/126  lung]
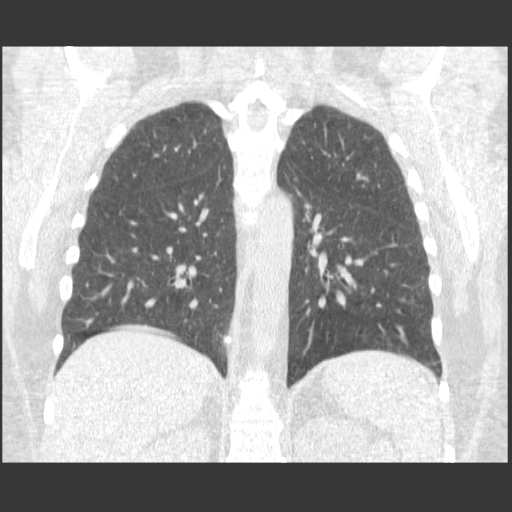

[15 of 36 positions shown; findings below may reference images not displayed]

Report from outside radiographs done 08/04/2010 at
[HOSPITAL] correlated - study unavailable
for direct comparison.
FINDINGS: There are no enlarged mediastinal or hilar lymph nodes.
Coronary artery calcifications are noted.  There is atherosclerosis
of the aortic arch.  There is no pleural or pericardial effusion.

There is an ill-defined right upper lobe nodular density with
primarily ground-glass components measuring 13 mm on image 20.  On
the same image, there is an 11 mm ground-glass opacity in the
superior segment of the right lower lobe.  There is also an ill-
defined ground-glass opacity in the superior segment of the left
lower lobe on image 23.  There is a small ground-glass opacity at
the left apex on image 12.  No well-defined mass, endobronchial
lesion or confluent airspace opacity is demonstrated.

Images through the upper abdomen demonstrate mild hepatic
steatosis.  There is a 6.8 cm cyst in the upper pole of the left
kidney which appears stable.  There is no adrenal mass.
IMPRESSION: 1. Nonspecific bilateral ill-defined nodules/ground-glass
opacities.  Largest lesions are in the right upper and lower lobes
at approximately the same level.  The multiplicity suggests an
inflammatory process (atypical infection, vasculitis or connective
tissue disease).  Atypical neoplasm (bronchoalveolar carcinoma) can
have this appearance, although the multiplicity is atypical.
2.  No adenopathy or pleural effusion.
3.  Chest CT followup in 3 months is recommended to evaluate
evolution/stability.

## 2012-03-08 ENCOUNTER — Inpatient Hospital Stay (HOSPITAL_COMMUNITY)
Admission: EM | Admit: 2012-03-08 | Discharge: 2012-03-13 | DRG: 193 | Disposition: A | Discharge status: Home / Self Care | Payer: Medicare HMO | Attending: Internal Medicine | Admitting: Internal Medicine

## 2012-03-08 ENCOUNTER — Encounter (HOSPITAL_COMMUNITY): Payer: Self-pay | Admitting: *Deleted

## 2012-03-08 ENCOUNTER — Emergency Department (HOSPITAL_COMMUNITY): Payer: Medicare HMO

## 2012-03-08 ENCOUNTER — Other Ambulatory Visit: Payer: Self-pay

## 2012-03-08 DIAGNOSIS — J40 Bronchitis, not specified as acute or chronic: Secondary | ICD-10-CM

## 2012-03-08 DIAGNOSIS — R0902 Hypoxemia: Secondary | ICD-10-CM | POA: Diagnosis present

## 2012-03-08 DIAGNOSIS — I10 Essential (primary) hypertension: Secondary | ICD-10-CM | POA: Diagnosis present

## 2012-03-08 DIAGNOSIS — J9601 Acute respiratory failure with hypoxia: Secondary | ICD-10-CM | POA: Diagnosis present

## 2012-03-08 DIAGNOSIS — J9600 Acute respiratory failure, unspecified whether with hypoxia or hypercapnia: Secondary | ICD-10-CM | POA: Diagnosis present

## 2012-03-08 DIAGNOSIS — I4891 Unspecified atrial fibrillation: Secondary | ICD-10-CM | POA: Diagnosis present

## 2012-03-08 DIAGNOSIS — C3490 Malignant neoplasm of unspecified part of unspecified bronchus or lung: Secondary | ICD-10-CM | POA: Diagnosis present

## 2012-03-08 DIAGNOSIS — I6350 Cerebral infarction due to unspecified occlusion or stenosis of unspecified cerebral artery: Secondary | ICD-10-CM

## 2012-03-08 DIAGNOSIS — J984 Other disorders of lung: Secondary | ICD-10-CM

## 2012-03-08 DIAGNOSIS — J441 Chronic obstructive pulmonary disease with (acute) exacerbation: Secondary | ICD-10-CM | POA: Diagnosis present

## 2012-03-08 DIAGNOSIS — J189 Pneumonia, unspecified organism: Principal | ICD-10-CM | POA: Diagnosis present

## 2012-03-08 DIAGNOSIS — Z8673 Personal history of transient ischemic attack (TIA), and cerebral infarction without residual deficits: Secondary | ICD-10-CM

## 2012-03-08 DIAGNOSIS — R0989 Other specified symptoms and signs involving the circulatory and respiratory systems: Secondary | ICD-10-CM

## 2012-03-08 DIAGNOSIS — J4489 Other specified chronic obstructive pulmonary disease: Secondary | ICD-10-CM | POA: Diagnosis present

## 2012-03-08 DIAGNOSIS — R930 Abnormal findings on diagnostic imaging of skull and head, not elsewhere classified: Secondary | ICD-10-CM

## 2012-03-08 DIAGNOSIS — C801 Malignant (primary) neoplasm, unspecified: Secondary | ICD-10-CM

## 2012-03-08 DIAGNOSIS — J449 Chronic obstructive pulmonary disease, unspecified: Secondary | ICD-10-CM | POA: Diagnosis present

## 2012-03-08 LAB — DIFFERENTIAL
Basophils Relative: 1 % (ref 0–1)
Lymphs Abs: 1.2 10*3/uL (ref 0.7–4.0)
Monocytes Absolute: 0.5 10*3/uL (ref 0.1–1.0)
Monocytes Relative: 8 % (ref 3–12)
Neutro Abs: 4.4 10*3/uL (ref 1.7–7.7)

## 2012-03-08 LAB — CBC
HCT: 45 % (ref 36.0–46.0)
Hemoglobin: 15.1 g/dL — ABNORMAL HIGH (ref 12.0–15.0)
MCH: 27.6 pg (ref 26.0–34.0)
MCHC: 33.6 g/dL (ref 30.0–36.0)
RBC: 5.48 MIL/uL — ABNORMAL HIGH (ref 3.87–5.11)

## 2012-03-08 LAB — BASIC METABOLIC PANEL
BUN: 14 mg/dL (ref 6–23)
Chloride: 97 mEq/L (ref 96–112)
Creatinine, Ser: 0.7 mg/dL (ref 0.50–1.10)
GFR calc Af Amer: >90 mL/min (ref >90)
Glucose, Bld: 120 mg/dL — ABNORMAL HIGH (ref 70–99)

## 2012-03-08 LAB — PROTIME-INR: INR: 1.51 — ABNORMAL HIGH (ref 0.00–1.49)

## 2012-03-08 LAB — EXPECTORATED SPUTUM ASSESSMENT W GRAM STAIN, RFLX TO RESP C

## 2012-03-08 MED ORDER — ACETAMINOPHEN 500 MG PO TABS: 1000.0000 mg | ORAL_TABLET | Freq: Four times a day (QID) | ORAL | Status: DC | PRN

## 2012-03-08 MED ORDER — DEXTROSE 5 % IV SOLN: 500.0000 mg | INTRAVENOUS | Status: DC

## 2012-03-08 MED ORDER — IPRATROPIUM-ALBUTEROL 18-103 MCG/ACT IN AERO
2.0000 | INHALATION_SPRAY | RESPIRATORY_TRACT | Status: DC | PRN
  Administered 2012-03-08: 2 via RESPIRATORY_TRACT
  Filled 2012-03-08: qty 14.7

## 2012-03-08 MED ORDER — WARFARIN SODIUM 2.5 MG PO TABS: 2.5000 mg | ORAL_TABLET | ORAL | Status: DC

## 2012-03-08 MED ORDER — DEXTROSE 5 % IV SOLN
500.0000 mg | Freq: Once | INTRAVENOUS | Status: AC
  Administered 2012-03-08: 500 mg via INTRAVENOUS
  Filled 2012-03-08: qty 500

## 2012-03-08 MED ORDER — WARFARIN SODIUM 5 MG PO TABS
5.0000 mg | ORAL_TABLET | Freq: Once | ORAL | Status: AC
  Administered 2012-03-08: 5 mg via ORAL
  Filled 2012-03-08: qty 1

## 2012-03-08 MED ORDER — IPRATROPIUM-ALBUTEROL 18-103 MCG/ACT IN AERO
2.0000 | INHALATION_SPRAY | RESPIRATORY_TRACT | Status: DC
  Administered 2012-03-08 – 2012-03-09 (×3): 2 via RESPIRATORY_TRACT

## 2012-03-08 MED ORDER — OYSTER CALCIUM 500 MG PO TABS
500.0000 mg | ORAL_TABLET | Freq: Every day | ORAL | Status: DC
  Administered 2012-03-10 – 2012-03-13 (×3): 500 mg via ORAL
  Filled 2012-03-08 (×5): qty 1

## 2012-03-08 MED ORDER — SIMVASTATIN 10 MG PO TABS
10.0000 mg | ORAL_TABLET | Freq: Every day | ORAL | Status: DC
  Administered 2012-03-08 – 2012-03-12 (×5): 10 mg via ORAL
  Filled 2012-03-08 (×6): qty 1

## 2012-03-08 MED ORDER — DEXTROSE 5 % IV SOLN
1.0000 g | INTRAVENOUS | Status: DC
  Administered 2012-03-09 – 2012-03-12 (×4): 1 g via INTRAVENOUS
  Filled 2012-03-08 (×5): qty 10

## 2012-03-08 MED ORDER — DEXTROSE 5 % IV SOLN: 1.0000 g | INTRAVENOUS | Status: DC

## 2012-03-08 MED ORDER — IPRATROPIUM BROMIDE 0.02 % IN SOLN
0.5000 mg | RESPIRATORY_TRACT | Status: DC
  Administered 2012-03-08 (×2): 0.5 mg via RESPIRATORY_TRACT
  Filled 2012-03-08 (×2): qty 2.5

## 2012-03-08 MED ORDER — WARFARIN - PHARMACIST DOSING INPATIENT: Freq: Every day | Status: DC

## 2012-03-08 MED ORDER — METOPROLOL TARTRATE 100 MG PO TABS
100.0000 mg | ORAL_TABLET | Freq: Two times a day (BID) | ORAL | Status: DC
  Administered 2012-03-08 – 2012-03-13 (×10): 100 mg via ORAL
  Filled 2012-03-08 (×11): qty 1

## 2012-03-08 MED ORDER — VITAMIN D3 25 MCG (1000 UNIT) PO TABS
1000.0000 [IU] | ORAL_TABLET | Freq: Every day | ORAL | Status: DC
  Administered 2012-03-10 – 2012-03-13 (×3): 1000 [IU] via ORAL
  Filled 2012-03-08 (×5): qty 1

## 2012-03-08 MED ORDER — CALCIUM CARBONATE 1250 (500 CA) MG PO TABS: 1.0000 | ORAL_TABLET | Freq: Every day | ORAL | Status: DC

## 2012-03-08 MED ORDER — METHYLPREDNISOLONE SODIUM SUCC 125 MG IJ SOLR
80.0000 mg | Freq: Two times a day (BID) | INTRAMUSCULAR | Status: DC
  Administered 2012-03-09: 80 mg via INTRAVENOUS
  Filled 2012-03-08 (×3): qty 1.28

## 2012-03-08 MED ORDER — METHYLPREDNISOLONE SODIUM SUCC 125 MG IJ SOLR
125.0000 mg | Freq: Once | INTRAMUSCULAR | Status: AC
  Administered 2012-03-08: 125 mg via INTRAVENOUS
  Filled 2012-03-08: qty 2

## 2012-03-08 MED ORDER — HYDROCHLOROTHIAZIDE 12.5 MG PO CAPS
12.5000 mg | ORAL_CAPSULE | Freq: Every day | ORAL | Status: DC
  Administered 2012-03-09: 12.5 mg via ORAL
  Filled 2012-03-08: qty 1

## 2012-03-08 MED ORDER — ALBUTEROL SULFATE (5 MG/ML) 0.5% IN NEBU
2.5000 mg | INHALATION_SOLUTION | RESPIRATORY_TRACT | Status: DC
  Administered 2012-03-08 (×2): 2.5 mg via RESPIRATORY_TRACT
  Filled 2012-03-08 (×2): qty 0.5

## 2012-03-08 MED ORDER — DEXTROSE 5 % IV SOLN
2.0000 g | Freq: Once | INTRAVENOUS | Status: AC
  Administered 2012-03-08: 2 g via INTRAVENOUS
  Filled 2012-03-08: qty 2

## 2012-03-08 MED ORDER — SODIUM CHLORIDE 0.9 % IV SOLN
INTRAVENOUS | Status: DC
  Administered 2012-03-08 – 2012-03-09 (×2): via INTRAVENOUS

## 2012-03-08 MED ORDER — DEXTROSE 5 % IV SOLN
500.0000 mg | INTRAVENOUS | Status: DC
  Administered 2012-03-09 – 2012-03-12 (×4): 500 mg via INTRAVENOUS
  Filled 2012-03-08 (×5): qty 500

## 2012-03-08 NOTE — ED Provider Notes (Signed)
History     CSN: [id number]  Arrival date & time 03/08/12  7829   First MD Initiated Contact with Patient 03/08/12 [phone]      Chief Complaint  Patient presents with  . Shortness of Breath  . Wheezing  . Cough    (Consider location/radiation/quality/duration/timing/severity/associated sxs/prior treatment) HPI  H/o lung ca s/p VATS, afib on coumadin pw fever, cough, SOB x 6 days. She also reports fever to 102 at home. She has been taking tylenol. Was seen by PMD pta and states that she was referred here for hypoxia. Denies chest pain. +mild sob at this time. Denies leg pain/swelling, orthopnea, PND. Denies N/V/Diaphoresis.  ED Notes, ED Provider Notes from 03/08/12 0000 to 03/08/12 09:36:47       Haley Lyn Records, RN 03/08/2012 09:34      Pt to PCP from home today after sob wheezing, cough that started Friday. No meds given at PCP, 2.5mg  albuterol by ems. O2 RA 72%, NRB 98% for ems. Hx lung Ca,     Past Medical History  Diagnosis Date  . Asthma   . Fibrocystic disease of breast   . Mini stroke     x2. Dr. Jefm Miles  / Dr. Lenora Boys   . Hematuria     Dr. Porfirio Mylar    . Renal calculi   . Hyperlipidemia   . Obesity   . Atrial fibrillation     on coumadin   . Hypertension   . Arthritis   . Seasonal allergies   . Nephrolithiasis   . Cystic disease of breast   . Cancer 10/01/11    ADENOCARCINOMA  LUNG    Past Surgical History  Procedure Date  . Tonsillectomy 50  . Kidney stones [age]  . Cyst of  left breast and right breast     Dr. Irena Reichmann   . Abdominal hysterectomy 1990    Dr. Colon Branch   . Dilation and curettage of uterus   . Lung cancer surgery 10/01/11  DR.BURNEY    (L)VATS,ANT. MINI THORACOTOMY, WEDGE RESECTION OF LULOBE LESION WITH NODWE SAMPLING    Family History  Problem Relation Age of Onset  . Throat cancer Mother     History  Substance Use Topics  . Smoking status: Former Smoker -- 3.0 packs/day for 32 years    Types: Cigarettes  . Smokeless tobacco:  Former Neurosurgeon    Quit date: 03/08/1990   Comment: smoked 3ppd from 1959-1991   . Alcohol Use: No    OB History    Grav Para Term Preterm Abortions TAB SAB Ect Mult Living                  Review of Systems  All other systems reviewed and are negative.   except as noted HPI   Allergies  Iohexol; Sulfa antibiotics; and Sulfamethoxazole w/trimethoprim  Home Medications   Current Outpatient Rx  Name Route Sig Dispense Refill  . ACETAMINOPHEN 500 MG PO TABS Oral Take 1,000 mg by mouth every 6 (six) hours as needed. For pain.    Marland Kitchen CALCIUM CARBONATE 1250 MG PO TABS Oral Take 1 tablet by mouth daily.    Marland Kitchen VITAMIN D 1000 UNITS PO TABS Oral Take 1,000 Units by mouth daily.     . OMEGA-3 FATTY ACIDS 1000 MG PO CAPS Oral Take 1 g by mouth daily.     Marland Kitchen FLAX SEED OIL PO Oral Take 1 capsule by mouth daily.     Marland Kitchen GARLIC 1000 MG PO CAPS  Oral Take 1 capsule by mouth 2 (two) times daily.     Marland Kitchen HYDROCHLOROTHIAZIDE 12.5 MG PO CAPS Oral Take 12.5 mg by mouth daily.    Marland Kitchen METOPROLOL TARTRATE 100 MG PO TABS Oral Take 100 mg by mouth 2 (two) times daily.    Marland Kitchen ONE-DAILY MULTI VITAMINS PO TABS Oral Take 1 tablet by mouth daily.     Marland Kitchen SIMVASTATIN 10 MG PO TABS Oral Take 10 mg by mouth at bedtime.      . WARFARIN SODIUM 5 MG PO TABS Oral Take 2.5-5 mg by mouth See admin instructions. 0.5 tab daily except 1 tab daily on Tuesdays and Saturdays.      BP 120/62  Pulse 100  Temp(Src) 99.8 F (37.7 C) (Oral)  Resp 30  SpO2 96%  Physical Exam  Nursing note and vitals reviewed. Constitutional: She is oriented to person, place, and time. She appears well-developed.  HENT:  Head: Atraumatic.  Mouth/Throat: Oropharynx is clear and moist.  Eyes: Conjunctivae and EOM are normal. Pupils are equal, round, and reactive to light.  Neck: Normal range of motion. Neck supple.  Cardiovascular: Regular rhythm, normal heart sounds and intact distal pulses.        Irregularly irregular Min tachycardia    Pulmonary/Chest: Effort normal. No respiratory distress. She has wheezes. She has no rales.       Diffuse exp wheeze > upper airway  Abdominal: Soft. She exhibits no distension. There is no tenderness. There is no rebound and no guarding.  Musculoskeletal: Normal range of motion.  Neurological: She is alert and oriented to person, place, and time.  Skin: Skin is warm and dry. No rash noted.  Psychiatric: She has a normal mood and affect.    Date: 03/08/2012  Rate: 102  Rhythm: atrial fibrillation  QRS Axis: normal  Intervals: normal afib  ST/T Wave abnormalities: normal  Conduction Disutrbances:none  Narrative Interpretation:   Old EKG Reviewed: no significant change noted ' ED Course  Procedures (including critical care time)  Labs Reviewed  CBC - Abnormal; Notable for the following:    RBC 5.48 (*)    Hemoglobin 15.1 (*)    All other components within normal limits  BASIC METABOLIC PANEL - Abnormal; Notable for the following:    Glucose, Bld 120 (*)    GFR calc non Af Amer 86 (*)    All other components within normal limits  PRO B NATRIURETIC PEPTIDE - Abnormal; Notable for the following:    Pro B Natriuretic peptide (BNP) 1118.0 (*)    All other components within normal limits  DIFFERENTIAL  TROPONIN I  CULTURE, BLOOD (ROUTINE X 2)  CULTURE, BLOOD (ROUTINE X 2)  PROTIME-INR   Dg Chest 1 View  03/08/2012  *RADIOLOGY REPORT*  Clinical Data: Shortness of breath.  Prior resection of a left upper lobe adenocarcinoma in October, 2012.  CHEST - 1 VIEW 03/08/2012:  Comparison: Two-view chest x-ray 12/14/2011, 11/02/2011, 10/14/2011 [facility] and 10/06/2011 [facility].  Findings: Cardiac silhouette upper normal in size to slightly enlarged but stable.  Interval development of prominent bronchovascular markings diffusely and marked central peribronchial thickening with associated patchy airspace opacities in the medial right lung base.  Post-surgical changes in  the left upper lobe are unchanged.  IMPRESSION: Pneumonia involving the right lung base, superimposed upon severe changes of acute bronchitis and/or asthma.  Stable post-surgical changes in the left upper lobe.  Original Report Authenticated By: Arnell Sieving, M.D.  1. Community acquired pneumonia   2. Atrial fibrillation   3. Bronchitis     MDM  H/o lung ca s/p VATS pw wheezing, cough, fever. Found to have RLL pna, community acquired. Has been given ceftriaxone, azithromycin, solumedrol, duoneb. Hypoxia resolved with oxygen by [facility]. Far less likely PE given clinical history and patient also taking coumadin for afib--INR pending. Admitted to [facility] hospitalist.         Forbes Cellar, MD 03/08/12 1308

## 2012-03-08 NOTE — H&P (Signed)
Hospital Admission Note Date: 03/08/2012  PCP: Dolores Hoose, OTR  Chief Complaint: Dyspnea on exertion  History of Present Illness:  This is a 71 year old female with past medical history of tobacco abuse quit about 30 years ago status post VATS in October 2012 with biopsy showing invasive moderately differentiated adenocarcinoma T1 N0, history of CVA, history of atrial fibrillation and hypertension that comes in for cough and dyspnea on exertion. She relates this started 4-5 days prior to admission which has progressively gotten worst she went to see her doctor today and was found to be hypoxic EMS was called he was brought here to the ED. She relates her cough is also and progressively getting worse productive and has change in color. She relates no coughing up of blood. She relates no fever at home. She relates she is short of breath denies any pain, any leg swelling, any orthopnea, PND, denies nausea, vomiting, diaphoresis or diarrhea.   Allergies: Iohexol; Sulfa antibiotics; and Sulfamethoxazole w/trimethoprim Past Medical History  Diagnosis Date  . Asthma   . Fibrocystic disease of breast   . Mini stroke     x2. Dr. Jefm Miles  / Dr. Lenora Boys   . Hematuria     Dr. Porfirio Mylar    . Renal calculi   . Hyperlipidemia   . Obesity   . Atrial fibrillation     on coumadin   . Hypertension   . Arthritis   . Seasonal allergies   . Nephrolithiasis   . Cystic disease of breast   . 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 Authorizing Provider  acetaminophen (TYLENOL) 500 MG tablet Take 1,000 mg by mouth every 6 (six) hours as needed. For pain.   Yes Historical Provider, MD  calcium carbonate (OS-CAL - DOSED IN MG OF ELEMENTAL CALCIUM) 1250 MG tablet Take 1 tablet by mouth daily.   Yes Historical Provider, MD  cholecalciferol (VITAMIN D) 1000 UNITS tablet Take 1,000 Units by mouth daily.    Yes Historical  Provider, MD  fish oil-omega-3 fatty acids 1000 MG capsule Take 1 g by mouth daily.    Yes Historical Provider, MD  Flaxseed, Linseed, (FLAX SEED OIL PO) Take 1 capsule by mouth daily.    Yes Historical Provider, MD  Garlic 1000 MG CAPS Take 1 capsule by mouth 2 (two) times daily.    Yes Historical Provider, MD  hydrochlorothiazide (MICROZIDE) 12.5 MG capsule Take 12.5 mg by mouth daily.   Yes Historical Provider, MD  metoprolol (LOPRESSOR) 100 MG tablet Take 100 mg by mouth 2 (two) times daily.   Yes Historical Provider, MD  Multiple Vitamin (MULTIVITAMIN) tablet Take 1 tablet by mouth daily.    Yes Historical Provider, MD  simvastatin (ZOCOR) 10 MG tablet Take 10 mg by mouth at bedtime.     Yes Historical Provider, MD  warfarin (COUMADIN) 5 MG tablet Take 2.5-5 mg by mouth See admin instructions. 0.5 tab daily except 1 tab daily on Tuesdays and Saturdays.   Yes Historical Provider, MD   Past Surgical History  Procedure Date  . Tonsillectomy 50  . Kidney stones [age]  . Cyst of  left breast and right breast     Dr. Irena Reichmann   . Abdominal hysterectomy 1990    Dr. Colon Branch   . Dilation and curettage of uterus   . Lung cancer surgery 10/01/11  DR.BURNEY    (L)VATS,ANT. MINI THORACOTOMY,  WEDGE RESECTION OF LULOBE LESION WITH NODWE SAMPLING   Family History  Problem Relation Age of Onset  . Throat cancer Mother   . Cancer Mother    History   Social History  . Marital Status: Widowed    Spouse Name: N/A    Number of Children: N/A  . Years of Education: N/A   Occupational History  . Not on file.   Social History Main Topics  . Smoking status: Former Smoker -- 3.0 packs/day for 32 years    Types: Cigarettes  . Smokeless tobacco: Former Neurosurgeon    Quit date: 03/08/1990   Comment: smoked 3ppd from 1959-1991   . Alcohol Use: No  . Drug Use: No  . Sexually Active: No   Other Topics Concern  . Not on file   Social History Narrative   Widowed, lives with daughter (only child) and  friend. Unemployed, Geophysical data processor. 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 Cell # [id number]    REVIEW OF SYSTEMS:  Constitutional:  No weight loss, night sweats, Fevers, chills, fatigue.  HEENT:  No headaches, Difficulty swallowing,Tooth/dental problems,Sore throat,  No sneezing, itching, ear ache, nasal congestion, post nasal drip,  Cardio-vascular:  No chest pain, Orthopnea, PND, swelling in lower extremities, anasarca, dizziness, palpitations  GI:  No heartburn, indigestion, abdominal pain, nausea, vomiting, diarrhea, change in bowel habits, loss of appetite  Resp:  No shortness of breath with exertion or at rest. No excess mucus, no productive cough, No non-productive cough, No coughing up of blood.No change in color of mucus.No wheezing.No chest wall deformity  Skin:  no rash or lesions.  GU:  no dysuria, change in color of urine, no urgency or frequency. No flank pain.  Musculoskeletal:  No joint pain or swelling. No decreased range of motion. No back pain.  Psych:  No change in mood or affect. No depression or anxiety. No memory loss.   Physical Exam: Filed Vitals:   03/08/12 5621 03/08/12 0942 03/08/12 1205 03/08/12 1207  BP:    120/62  Pulse:    100  Temp: 99.8 F (37.7 C)     TempSrc: Oral     Resp: 30     SpO2: 100% 95% 95% 96%   No intake or output data in the 24 hours ending 03/08/12 1516 BP 120/62  Pulse 100  Temp(Src) 99.8 F (37.7 C) (Oral)  Resp 30  SpO2 96%  General Appearance:    Alert, cooperative, no distress, appears stated age. Not able to speak in full sentences.  Head:    Normocephalic, without obvious abnormality, atraumatic  Eyes:    PERRL, conjunctiva/corneas clear, EOM's intact, fundi    benign, both eyes  Ears:    Normal TM's and external ear canals, both ears  Nose:   Nares normal, septum midline, mucosa normal, no drainage    or sinus tenderness  Throat:   Lips, mucosa, and tongue normal; teeth and gums normal  Neck:   Supple, symmetrical,  trachea midline, no adenopathy;    thyroid:  no enlargement/tenderness/nodules; no carotid   bruit or JVD  Back:     Symmetric, no curvature, ROM normal, no CVA tenderness  Lungs:     Moderate air movement, with crackle at the right lung bases and wheezing b/l  Chest Wall:    No tenderness or deformity   Heart:    Regular rate and rhythm, S1 and S2 normal, no murmur, rub   or gallop     Abdomen:  Soft, non-tender, bowel sounds active all four quadrants,    no masses, no organomegaly        Extremities:   Extremities normal, atraumatic, no cyanosis or edema  Pulses:   2+ and symmetric all extremities  Skin:   Skin color, texture, turgor normal, no rashes or lesions  Lymph nodes:   Cervical, supraclavicular, and axillary nodes normal  Neurologic:   CNII-XII intact, normal strength, sensation and reflexes    throughout   Lab results:  Basename 03/08/12 1036  NA 135  K 3.9  CL 97  CO2 26  GLUCOSE 120*  BUN 14  CREATININE 0.70  CALCIUM 9.3  MG --  PHOS --   No results found for this basename: AST:2,ALT:2,ALKPHOS:2,BILITOT:2,PROT:2,ALBUMIN:2 in the last 72 hours No results found for this basename: LIPASE:2,AMYLASE:2 in the last 72 hours  Basename 03/08/12 1036  WBC 6.1  NEUTROABS 4.4  HGB 15.1*  HCT 45.0  MCV 82.1  PLT 179    Basename 03/08/12 1036  CKTOTAL --  CKMB --  CKMBINDEX --  TROPONINI <0.30   No components found with this basename: POCBNP:3 No results found for this basename: DDIMER:2 in the last 72 hours No results found for this basename: HGBA1C:2 in the last 72 hours No results found for this basename: CHOL:2,HDL:2,LDLCALC:2,TRIG:2,CHOLHDL:2,LDLDIRECT:2 in the last 72 hours No results found for this basename: TSH,T4TOTAL,FREET3,T3FREE,THYROIDAB in the last 72 hours No results found for this basename: VITAMINB12:2,FOLATE:2,FERRITIN:2,TIBC:2,IRON:2,RETICCTPCT:2 in the last 72 hours Imaging results:  Dg Chest 1 View  03/08/2012  *RADIOLOGY REPORT*   Clinical Data: Shortness of breath.  Prior resection of a left upper lobe adenocarcinoma in October, 2012.  CHEST - 1 VIEW 03/08/2012:  Comparison: Two-view chest x-ray 12/14/2011, 11/02/2011, 10/14/2011 [facility] and 10/06/2011 [facility].  Findings: Cardiac silhouette upper normal in size to slightly enlarged but stable.  Interval development of prominent bronchovascular markings diffusely and marked central peribronchial thickening with associated patchy airspace opacities in the medial right lung base.  Post-surgical changes in the left upper lobe are unchanged.  IMPRESSION: Pneumonia involving the right lung base, superimposed upon severe changes of acute bronchitis and/or asthma.  Stable post-surgical changes in the left upper lobe.  Original Report Authenticated By: Arnell Sieving, M.D.   Other results: EKG: normal EKG, normal sinus rhythm, unchanged from previous tracings, atrial fibrillation, rate 103, no T wave inversions or axis deviation.   Patient Active Hospital Problem List: -Acute respiratory failure with hypoxia (03/08/2012)  is most likely multifactorial secondary to her pneumonia and questionable acute COPD exacerbation. We'll start her on IV fluids, steroids albuterol and Atrovent. We'll put her nasal cannula. And monitor her vitals.   -PNA (pneumonia) (03/08/2012)  Is most likely community-acquired pneumonia. She relates that her grandson had pneumonia diagnosed about a week ago, her daughter is at bedside and also has flulike symptoms. We'll admit her to regular floor. We'll get blood cultures and sputum cultures. We'll start her on Rocephin and  Azithromycin. Monitor her saturations  -Acute exacerbation of chronic obstructive pulmonary disease (COPD) (03/08/2012) She does have significant wheezing by physical exam, we'll start her on Combivent, and IV steroids. She has a history as per history of present illness or asthma. She does have a significant past  medical history of tobacco abuse. Which makes me lean more towards COPD than asthma. She has not had PFTs in the past. This will have to be done as an outpatient   -HYPERTENSION, BENIGN (09/10/2009)  currently stable continue  home meds.  -Atrial fibrillation with RVR (03/08/2012)  currently just borderline in the 100, I will continue her metoprolol and asked pharmacy to dose her Coumadin. We'll continue to monitor her closely   Code Status: Full code Family Communication: Daughter [phone]   Marinda Elk M.D. [facility] [phone] 03/08/2012, 3:16 PM

## 2012-03-08 NOTE — Progress Notes (Signed)
ANTICOAGULATION CONSULT NOTE - Initial Consult  Pharmacy Consult for Warfarin Indication: atrial fibrillation  Allergies  Allergen Reactions  . Iohexol      Code: HIVES, Desc: hives w/ contrast '08// better w/ benadryl   . Sulfa Antibiotics   . Sulfamethoxazole W/Trimethoprim     Patient Measurements: Height: 5\' 11"  (180.3 cm) (stated height) Weight: 266 lb 5.1 oz (120.8 kg) IBW/kg (Calculated) : 70.8   Vital Signs: Temp: 99.8 F (37.7 C) (03/13 1534) Temp src: Oral (03/13 1534) BP: 146/94 mmHg (03/13 1534) Pulse Rate: 112  (03/13 1534)  Labs:  Basename 03/08/12 1407 03/08/12 1036  HGB -- 15.1*  HCT -- 45.0  PLT -- 179  APTT -- --  LABPROT 18.5* --  INR 1.51* --  HEPARINUNFRC -- --  CREATININE -- 0.70  CKTOTAL -- --  CKMB -- --  TROPONINI -- <0.30   Estimated Creatinine Clearance: 93.8 ml/min (by C-G formula based on Cr of 0.7).  Medical History: Past Medical History  Diagnosis Date  . Asthma   . Fibrocystic disease of breast   . Mini stroke     x2. Dr. Jefm Miles  / Dr. Lenora Boys   . Hematuria     Dr. Porfirio Mylar    . Renal calculi   . Hyperlipidemia   . Obesity   . Atrial fibrillation     on coumadin   . Hypertension   . Arthritis   . Seasonal allergies   . Nephrolithiasis   . Cystic disease of breast   . 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 ipratropium  0.5 mg Nebulization Q4H   And  . albuterol  2.5 mg Nebulization Q4H  . albuterol-ipratropium  2 puff Inhalation Q4H  . azithromycin  500 mg Intravenous Once  . azithromycin  500 mg Intravenous Q24H  . calcium carbonate  1 tablet Oral Daily  . cefTRIAXone (ROCEPHIN)  IV  1 g Intravenous Q24H  . cefTRIAXone (ROCEPHIN)  IV  2 g Intravenous Once  . cholecalciferol  1,000 Units Oral Daily  . hydrochlorothiazide  12.5 mg Oral Daily  . methylPREDNISolone (SOLU-MEDROL) injection  125 mg Intravenous Once  . methylPREDNISolone (SOLU-MEDROL) injection  80 mg Intravenous  Q12H  . metoprolol  100 mg Oral BID  . simvastatin  10 mg Oral QHS  . DISCONTD: warfarin  2.5-5 mg Oral See admin instructions   Infusions:    . sodium chloride     PRN: acetaminophen, albuterol-ipratropium  Assessment: 71 yo F admitted 3/13 with CC: dyspnea on exertion, brought in via EMS from her doctor's office due to hypoxia. DDx: PNA vs COPD exacerbation. Patient is on chronic warfarin for hx of Afib. INR is subtherapeutic. No bleeding reported.  Home warfarin dose reported as 5mg  on TueSat and 2.5mg  on MWThFSun. Will give slightly higher dose of warfarin tonight.  Goal of Therapy:  INR 2-3   Plan:  1) Warfarin 5mg  PO x1 at 18:00 2) Daily PT/INR.  Annia Belt 03/08/2012,4:07 PM

## 2012-03-08 NOTE — ED Notes (Signed)
RUE:AVWU<JW> Expected date:03/08/12<BR> Expected time: 9:20 AM<BR> Means of arrival:Ambulance<BR> Comments:<BR> SOB

## 2012-03-08 NOTE — ED Notes (Signed)
Pt to PCP from home today after sob wheezing, cough that started Friday. No meds given at PCP, 2.5mg  albuterol by ems. O2 RA 72%, NRB 98% for ems. Hx lung Ca,

## 2012-03-09 LAB — CBC
HCT: 43.6 % (ref 36.0–46.0)
MCHC: 33.7 g/dL (ref 30.0–36.0)
MCV: 81.6 fL (ref 78.0–100.0)
RDW: 14.4 % (ref 11.5–15.5)

## 2012-03-09 LAB — DIFFERENTIAL
Basophils Relative: 1 % (ref 0–1)
Eosinophils Relative: 0 % (ref 0–5)
Lymphocytes Relative: 19 % (ref 12–46)
Neutrophils Relative %: 76 % (ref 43–77)

## 2012-03-09 LAB — COMPREHENSIVE METABOLIC PANEL
Albumin: 3.7 g/dL (ref 3.5–5.2)
BUN: 21 mg/dL (ref 6–23)
Creatinine, Ser: 0.68 mg/dL (ref 0.50–1.10)
Total Protein: 7.9 g/dL (ref 6.0–8.3)

## 2012-03-09 LAB — LEGIONELLA ANTIGEN, URINE: Legionella Antigen, Urine: NEGATIVE

## 2012-03-09 LAB — PROTIME-INR
INR: 1.56 — ABNORMAL HIGH (ref 0.00–1.49)
Prothrombin Time: 19 seconds — ABNORMAL HIGH (ref 11.6–15.2)

## 2012-03-09 MED ORDER — LEVALBUTEROL TARTRATE 45 MCG/ACT IN AERO
2.0000 | INHALATION_SPRAY | RESPIRATORY_TRACT | Status: DC
  Administered 2012-03-09 (×2): 2 via RESPIRATORY_TRACT
  Filled 2012-03-09: qty 15

## 2012-03-09 MED ORDER — IPRATROPIUM BROMIDE 0.02 % IN SOLN
0.5000 mg | RESPIRATORY_TRACT | Status: DC | PRN
  Administered 2012-03-10: 0.5 mg via RESPIRATORY_TRACT

## 2012-03-09 MED ORDER — IPRATROPIUM BROMIDE 0.02 % IN SOLN
0.5000 mg | Freq: Four times a day (QID) | RESPIRATORY_TRACT | Status: DC
  Administered 2012-03-09 – 2012-03-10 (×4): 0.5 mg via RESPIRATORY_TRACT
  Filled 2012-03-09 (×5): qty 2.5

## 2012-03-09 MED ORDER — METHYLPREDNISOLONE SODIUM SUCC 125 MG IJ SOLR
60.0000 mg | Freq: Four times a day (QID) | INTRAMUSCULAR | Status: DC
  Administered 2012-03-09 – 2012-03-12 (×11): 60 mg via INTRAVENOUS
  Filled 2012-03-09 (×17): qty 0.96

## 2012-03-09 MED ORDER — WARFARIN SODIUM 5 MG PO TABS
5.0000 mg | ORAL_TABLET | Freq: Once | ORAL | Status: AC
  Administered 2012-03-09: 5 mg via ORAL
  Filled 2012-03-09: qty 1

## 2012-03-09 MED ORDER — GUAIFENESIN ER 600 MG PO TB12
600.0000 mg | ORAL_TABLET | Freq: Two times a day (BID) | ORAL | Status: DC
  Administered 2012-03-09 – 2012-03-13 (×9): 600 mg via ORAL
  Filled 2012-03-09 (×10): qty 1

## 2012-03-09 MED ORDER — LEVALBUTEROL HCL 0.63 MG/3ML IN NEBU
0.6300 mg | INHALATION_SOLUTION | RESPIRATORY_TRACT | Status: DC | PRN
  Administered 2012-03-10: 0.63 mg via RESPIRATORY_TRACT
  Filled 2012-03-09: qty 3

## 2012-03-09 MED ORDER — LEVALBUTEROL HCL 0.63 MG/3ML IN NEBU
0.6300 mg | INHALATION_SOLUTION | Freq: Four times a day (QID) | RESPIRATORY_TRACT | Status: DC
  Administered 2012-03-09 – 2012-03-10 (×4): 0.63 mg via RESPIRATORY_TRACT
  Filled 2012-03-09 (×11): qty 3

## 2012-03-09 MED ORDER — IPRATROPIUM BROMIDE HFA 17 MCG/ACT IN AERS
2.0000 | INHALATION_SPRAY | RESPIRATORY_TRACT | Status: DC
Start: 2012-03-09 — End: 2012-03-09
  Administered 2012-03-09 (×2): 2 via RESPIRATORY_TRACT
  Filled 2012-03-09: qty 12.9

## 2012-03-09 NOTE — Progress Notes (Signed)
CARE MANAGEMENT NOTE 03/09/2012  Patient:  Tammie [name], Tammie [name]   Account Number:  [id number]  Date Initiated:  03/09/2012  Documentation initiated by:  Jeovany Huitron  Subjective/Objective Assessment:   pt admitted with confirmed pneumonia, recent lung surg and exposure to pneumonia from several family members     Action/Plan:   lives at home   Anticipated DC Date:  03/12/2012   Anticipated DC Plan:  HOME/SELF CARE  In-house referral  NA      DC Planning Services  NA      [facility] Choice  NA   Choice offered to / List presented to:  NA   DME arranged  NA      DME agency  NA     HH arranged  NA      HH agency  NA   Status of service:  In process, will continue to follow Medicare Important Message given?  NA - LOS <3 / Initial given by admissions (If response is "NO", the following Medicare IM given date fields will be blank) Date Medicare IM given:  03/08/2012 Date Additional Medicare IM given:    Discharge Disposition:    Per UR Regulation:  Reviewed for med. necessity/level of care/duration of stay  If discussed at Long Length of Stay Meetings, dates discussed:    Comments:  [id number]/Tareek Sabo,RN,BSN,CCM

## 2012-03-09 NOTE — Progress Notes (Signed)
ANTICOAGULATION CONSULT NOTE - Follow Up Consult  Pharmacy Consult for Warfarin Indication: atrial fibrillation  Allergies  Allergen Reactions  . Iohexol      Code: HIVES, Desc: hives w/ contrast '08// better w/ benadryl   . Sulfa Antibiotics   . Sulfamethoxazole W/Trimethoprim    Labs:  [facility] 03/09/12 0635 03/08/12 1407 03/08/12 1036  HGB 14.7 -- 15.1*  HCT 43.6 -- 45.0  PLT 175 -- 179  APTT -- -- --  LABPROT 19.0* 18.5* --  INR 1.56* 1.51* --  HEPARINUNFRC -- -- --  CREATININE -- -- 0.70  CKTOTAL -- -- --  CKMB -- -- --  TROPONINI -- -- <0.30   Estimated Creatinine Clearance: 93.8 ml/min (by C-G formula based on Cr of 0.7).   Medications:  Scheduled:    . azithromycin  500 mg Intravenous Once  . azithromycin  500 mg Intravenous Q24H  . cefTRIAXone (ROCEPHIN)  IV  1 g Intravenous Q24H  . cefTRIAXone (ROCEPHIN)  IV  2 g Intravenous Once  . cholecalciferol  1,000 Units Oral Daily  . hydrochlorothiazide  12.5 mg Oral Daily  . ipratropium  2 puff Inhalation Q4H  . levalbuterol  2 puff Inhalation Q4H  . methylPREDNISolone (SOLU-MEDROL) injection  125 mg Intravenous Once  . methylPREDNISolone (SOLU-MEDROL) injection  80 mg Intravenous Q12H  . metoprolol  100 mg Oral BID  . oyster calcium  500 mg of elemental calcium Oral Daily  . simvastatin  10 mg Oral QHS  . warfarin  5 mg Oral ONCE-1800  . warfarin  5 mg Oral Once  . Warfarin - Pharmacist Dosing Inpatient   Does not apply q1800  . DISCONTD: albuterol  2.5 mg Nebulization Q4H  . DISCONTD: albuterol-ipratropium  2 puff Inhalation Q4H  . DISCONTD: azithromycin  500 mg Intravenous Q24H  . DISCONTD: calcium carbonate  1 tablet Oral Daily  . DISCONTD: cefTRIAXone (ROCEPHIN)  IV  1 g Intravenous Q24H  . DISCONTD: ipratropium  0.5 mg Nebulization Q4H  . DISCONTD: warfarin  2.5-5 mg Oral See admin instructions    Assessment:  71 yo female: chronic Coumadin for Afib  Home Coumadin dose 2.5mg  daily except 5mg   Tu,Sat  Admit with CAP   INR subtherapeutic on admit, 1.56 today after 5mg  3/13 Goal of Therapy:  INR 2-3   Plan:   Repeat 5mg  Coumadin today, give dose early  Continue daily protimes.  Otho Bellows PharmD Pager [phone] 03/09/2012,7:07 AM

## 2012-03-09 NOTE — Progress Notes (Signed)
Subjective: Feels marginally better. Cough productive of yellowish phlegm.  Objective: Vital signs in last 24 hours: Temp:  [97.7 F (36.5 C)-99.8 F (37.7 C)] 98.1 F (36.7 C) (03/14 0444) Pulse Rate:  [101-112] 101  (03/14 0444) Resp:  [18-32] 18  (03/14 0444) BP: (136-155)/(57-94) 155/94 mmHg (03/14 0444) SpO2:  [92 %-97 %] 95 % (03/14 0907) FiO2 (%):  [28 %] 28 % (03/13 1516) Weight:  [120.8 kg (266 lb 5.1 oz)] 120.8 kg (266 lb 5.1 oz) (03/13 1534) Weight change:  Last BM Date: 03/08/12  Intake/Output from previous day: 03/13 0701 - 03/14 0700 In: 27.5 [I.V.:27.5] Out: 300 [Urine:300]     Physical Exam: General: Comfortable, alert, communicative, fully oriented, not short of breath at rest.  HEENT:  No clinical pallor, no jaundice, no conjunctival injection or discharge. Hydration status is satisfactory. NECK:  Supple, JVP not seen, no carotid bruits, no palpable lymphadenopathy, no palpable goiter. CHEST:  Bilateral faint expiratory wheezes, no crackles. HEART:  Sounds 1 and 2 heard, normal, regular, no murmurs. ABDOMEN:  Obese, soft, non-tender, no palpable organomegaly, no palpable masses, normal bowel sounds. GENITALIA:  Not examined. LOWER EXTREMITIES:  No pitting edema, palpable peripheral pulses. MUSCULOSKELETAL SYSTEM:  Generalized osteoarthritic changes, otherwise, normal. CENTRAL NERVOUS SYSTEM:  No focal neurologic deficit on gross examination.  Lab Results:  Basename 03/09/12 0635 03/08/12 1036  WBC 5.9 6.1  HGB 14.7 15.1*  HCT 43.6 45.0  PLT 175 179    Basename 03/09/12 0635 03/08/12 1036  NA 136 135  K 3.9 3.9  CL 97 97  CO2 23 26  GLUCOSE 198* 120*  BUN 21 14  CREATININE 0.68 0.70  CALCIUM 9.2 9.3   Recent Results (from the past 240 hour(s))  CULTURE, BLOOD (ROUTINE X 2)     Status: Normal (Preliminary result)   Collection Time   03/08/12 11:42 AM      Component Value Range Status Comment   Specimen Description BLOOD LEFT HAND   Final    Special Requests BOTTLES DRAWN AEROBIC AND ANAEROBIC 5CC   Final    Culture  Setup Time [id number]   Final    Culture     Final    Value:        BLOOD CULTURE RECEIVED NO GROWTH TO DATE CULTURE WILL BE HELD FOR 5 DAYS BEFORE ISSUING A FINAL NEGATIVE REPORT   Report Status PENDING   Incomplete   CULTURE, BLOOD (ROUTINE X 2)     Status: Normal (Preliminary result)   Collection Time   03/08/12 11:42 AM      Component Value Range Status Comment   Specimen Description BLOOD LEFT ARM   Final    Special Requests BOTTLES DRAWN AEROBIC AND ANAEROBIC 4CC   Final    Culture  Setup Time [id number]   Final    Culture     Final    Value:        BLOOD CULTURE RECEIVED NO GROWTH TO DATE CULTURE WILL BE HELD FOR 5 DAYS BEFORE ISSUING A FINAL NEGATIVE REPORT   Report Status PENDING   Incomplete   CULTURE, SPUTUM-ASSESSMENT     Status: Normal   Collection Time   03/08/12  7:00 PM      Component Value Range Status Comment   Specimen Description SPUTUM   Final    Special Requests NONE   Final    Sputum evaluation     Final    Value: THIS SPECIMEN IS ACCEPTABLE. RESPIRATORY CULTURE REPORT  TO FOLLOW.   Report Status 03/08/2012 FINAL   Final   CULTURE, RESPIRATORY     Status: Normal (Preliminary result)   Collection Time   03/08/12  7:00 PM      Component Value Range Status Comment   Specimen Description SPUTUM   Final    Special Requests NONE   Final    Gram Stain     Final    Value: NO WBC SEEN     NO SQUAMOUS EPITHELIAL CELLS SEEN     RARE GRAM POSITIVE COCCI IN PAIRS   Culture NO GROWTH   Final    Report Status PENDING   Incomplete      Studies/Results: Dg Chest 1 View  03/08/2012  *RADIOLOGY REPORT*  Clinical Data: Shortness of breath.  Prior resection of a left upper lobe adenocarcinoma in October, 2012.  CHEST - 1 VIEW 03/08/2012:  Comparison: Two-view chest x-ray 12/14/2011, 11/02/2011, 10/14/2011 [facility] and 10/06/2011 [facility].  Findings: Cardiac silhouette upper  normal in size to slightly enlarged but stable.  Interval development of prominent bronchovascular markings diffusely and marked central peribronchial thickening with associated patchy airspace opacities in the medial right lung base.  Post-surgical changes in the left upper lobe are unchanged.  IMPRESSION: Pneumonia involving the right lung base, superimposed upon severe changes of acute bronchitis and/or asthma.  Stable post-surgical changes in the left upper lobe.  Original Report Authenticated By: Arnell Sieving, M.D.    Medications: Scheduled Meds:   . azithromycin  500 mg Intravenous Once  . azithromycin  500 mg Intravenous Q24H  . cefTRIAXone (ROCEPHIN)  IV  1 g Intravenous Q24H  . cefTRIAXone (ROCEPHIN)  IV  2 g Intravenous Once  . cholecalciferol  1,000 Units Oral Daily  . hydrochlorothiazide  12.5 mg Oral Daily  . ipratropium  2 puff Inhalation Q4H  . levalbuterol  2 puff Inhalation Q4H  . methylPREDNISolone (SOLU-MEDROL) injection  125 mg Intravenous Once  . methylPREDNISolone (SOLU-MEDROL) injection  80 mg Intravenous Q12H  . metoprolol  100 mg Oral BID  . oyster calcium  500 mg of elemental calcium Oral Daily  . simvastatin  10 mg Oral QHS  . warfarin  5 mg Oral ONCE-1800  . warfarin  5 mg Oral Once  . Warfarin - Pharmacist Dosing Inpatient   Does not apply q1800  . DISCONTD: albuterol  2.5 mg Nebulization Q4H  . DISCONTD: albuterol-ipratropium  2 puff Inhalation Q4H  . DISCONTD: azithromycin  500 mg Intravenous Q24H  . DISCONTD: calcium carbonate  1 tablet Oral Daily  . DISCONTD: cefTRIAXone (ROCEPHIN)  IV  1 g Intravenous Q24H  . DISCONTD: ipratropium  0.5 mg Nebulization Q4H  . DISCONTD: warfarin  2.5-5 mg Oral See admin instructions   Continuous Infusions:   . sodium chloride 75 mL/hr at 03/09/12 0813   PRN Meds:.acetaminophen, DISCONTD: albuterol-ipratropium  Assessment/Plan:  1. Acute respiratory failure with hypoxia: Patient presented with O2 of 72% on  RA, requiring NRB 98%, per EMS. This was multifactorial,  secondary to pneumonia/acute COPD exacerbation. She was managed a s outlijed below, with improvement. As of 03/09/12, Sats are > 90% on 2l. 2. PNA (pneumonia):  Community-acquired pneumonia. Patient has had recent sick contact, with her grandson having had a pneumonia diagnosed about a week ago, and her her daughter has flu-like symptoms. Now on Rocephin/Azithromycin, day#2.  Blood cultures and sputum cultures are pending. Clinical improvement is already evident. Patient has been apyrexial overnight, and feels better. 3.  Acute exacerbation of chronic obstructive pulmonary disease (COPD):  This is infective, precipitated by pneumonia. Managing as above, and with Nebs, parenteral steroids and Mucinex. She does have a significant past medical history of tobacco abuse.  4. HYPERTENSION, BENIGN:  BP is currently controlled on pre-admission meds.  5. Atrial fibrillation with RVR:  Patient has a known history of atrial fibrillation, and presented with HR just borderline in the low 100s, likely due to her acute problems, and Albuterol treatment. We shall continue Metoprolol, change nebulizers to Xopenex, and check TSH. Pharmacy is dosing her Coumadin.    Comment: Hydration is satisfactory. Have discontinued iv fluids.   LOS: 1 day   Tammie [name],CHRISTOPHER 03/09/2012, 12:26 PM

## 2012-03-10 LAB — CBC
Platelets: 219 10*3/uL (ref 150–400)
RBC: 5 MIL/uL (ref 3.87–5.11)
WBC: 15.1 10*3/uL — ABNORMAL HIGH (ref 4.0–10.5)

## 2012-03-10 LAB — BASIC METABOLIC PANEL
CO2: 28 mEq/L (ref 19–32)
Calcium: 9.2 mg/dL (ref 8.4–10.5)
Chloride: 101 mEq/L (ref 96–112)
Sodium: 139 mEq/L (ref 135–145)

## 2012-03-10 LAB — PROTIME-INR
INR: 2.67 — ABNORMAL HIGH (ref 0.00–1.49)
Prothrombin Time: 28.9 seconds — ABNORMAL HIGH (ref 11.6–15.2)

## 2012-03-10 MED ORDER — WARFARIN SODIUM 2 MG PO TABS
2.0000 mg | ORAL_TABLET | Freq: Once | ORAL | Status: AC
  Administered 2012-03-10: 2 mg via ORAL
  Filled 2012-03-10: qty 1

## 2012-03-10 MED ORDER — LEVALBUTEROL HCL 0.63 MG/3ML IN NEBU
0.6300 mg | INHALATION_SOLUTION | RESPIRATORY_TRACT | Status: DC
  Administered 2012-03-10 – 2012-03-12 (×10): 0.63 mg via RESPIRATORY_TRACT
  Filled 2012-03-10 (×15): qty 3

## 2012-03-10 MED ORDER — AMLODIPINE BESYLATE 10 MG PO TABS
10.0000 mg | ORAL_TABLET | Freq: Every day | ORAL | Status: DC
  Administered 2012-03-10 – 2012-03-13 (×4): 10 mg via ORAL
  Filled 2012-03-10 (×4): qty 1

## 2012-03-10 MED ORDER — IPRATROPIUM BROMIDE 0.02 % IN SOLN
0.5000 mg | RESPIRATORY_TRACT | Status: DC
  Administered 2012-03-10 – 2012-03-12 (×10): 0.5 mg via RESPIRATORY_TRACT
  Filled 2012-03-10 (×10): qty 2.5

## 2012-03-10 NOTE — Progress Notes (Signed)
Subjective: Still has fruity cough productive of yellowish phlegm. Chest feels tight.  Objective: Vital signs in last 24 hours: Temp:  [98 F (36.7 C)-99.2 F (37.3 C)] 99.2 F (37.3 C) (03/15 1355) Pulse Rate:  [89-107] 89  (03/15 1355) Resp:  [18-20] 18  (03/15 1355) BP: (156-170)/(94-99) 164/97 mmHg (03/15 1355) SpO2:  [94 %-98 %] 98 % (03/15 1355) Weight change:  Last BM Date: 03/09/12  Intake/Output from previous day: 03/14 0701 - 03/15 0700 In: 720 [P.O.:720] Out: -      Physical Exam: General: Alert, communicative, fully oriented, audibly wheezy.  HEENT:  No clinical pallor, no jaundice, no conjunctival injection or discharge. Hydration status is satisfactory. NECK:  Supple, JVP not seen, no carotid bruits, no palpable lymphadenopathy, no palpable goiter. CHEST:  Bilateral expiratory wheezes, no crackles. HEART:  Sounds 1 and 2 heard, normal, regular, no murmurs. ABDOMEN:  Obese, soft, non-tender, no palpable organomegaly, no palpable masses, normal bowel sounds. GENITALIA:  Not examined. LOWER EXTREMITIES:  No pitting edema, palpable peripheral pulses. MUSCULOSKELETAL SYSTEM:  Generalized osteoarthritic changes, otherwise, normal. CENTRAL NERVOUS SYSTEM:  No focal neurologic deficit on gross examination.  Lab Results:  Basename 03/10/12 0430 03/09/12 0635  WBC 15.1* 5.9  HGB 13.9 14.7  HCT 41.4 43.6  PLT 219 175    Basename 03/10/12 0430 03/09/12 0635  NA 139 136  K 4.0 3.9  CL 101 97  CO2 28 23  GLUCOSE 165* 198*  BUN 25* 21  CREATININE 0.69 0.68  CALCIUM 9.2 9.2   Recent Results (from the past 240 hour(s))  CULTURE, BLOOD (ROUTINE X 2)     Status: Normal (Preliminary result)   Collection Time   03/08/12 11:42 AM      Component Value Range Status Comment   Specimen Description BLOOD LEFT HAND   Final    Special Requests BOTTLES DRAWN AEROBIC AND ANAEROBIC 5CC   Final    Culture  Setup Time [id number]   Final    Culture     Final    Value:         BLOOD CULTURE RECEIVED NO GROWTH TO DATE CULTURE WILL BE HELD FOR 5 DAYS BEFORE ISSUING A FINAL NEGATIVE REPORT   Report Status PENDING   Incomplete   CULTURE, BLOOD (ROUTINE X 2)     Status: Normal (Preliminary result)   Collection Time   03/08/12 11:42 AM      Component Value Range Status Comment   Specimen Description BLOOD LEFT ARM   Final    Special Requests BOTTLES DRAWN AEROBIC AND ANAEROBIC 4CC   Final    Culture  Setup Time [id number]   Final    Culture     Final    Value:        BLOOD CULTURE RECEIVED NO GROWTH TO DATE CULTURE WILL BE HELD FOR 5 DAYS BEFORE ISSUING A FINAL NEGATIVE REPORT   Report Status PENDING   Incomplete   CULTURE, SPUTUM-ASSESSMENT     Status: Normal   Collection Time   03/08/12  7:00 PM      Component Value Range Status Comment   Specimen Description SPUTUM   Final    Special Requests NONE   Final    Sputum evaluation     Final    Value: THIS SPECIMEN IS ACCEPTABLE. RESPIRATORY CULTURE REPORT TO FOLLOW.   Report Status 03/08/2012 FINAL   Final   CULTURE, RESPIRATORY     Status: Normal (Preliminary result)   Collection Time  03/08/12  7:00 PM      Component Value Range Status Comment   Specimen Description SPUTUM   Final    Special Requests NONE   Final    Gram Stain     Final    Value: NO WBC SEEN     NO SQUAMOUS EPITHELIAL CELLS SEEN     RARE GRAM POSITIVE COCCI IN PAIRS   Culture NORMAL OROPHARYNGEAL FLORA   Final    Report Status PENDING   Incomplete      Studies/Results: No results found.  Medications: Scheduled Meds:    . azithromycin  500 mg Intravenous Q24H  . cefTRIAXone (ROCEPHIN)  IV  1 g Intravenous Q24H  . cholecalciferol  1,000 Units Oral Daily  . guaiFENesin  600 mg Oral BID  . ipratropium  0.5 mg Nebulization Q6H  . levalbuterol  0.63 mg Nebulization Q6H  . methylPREDNISolone (SOLU-MEDROL) injection  60 mg Intravenous Q6H  . metoprolol  100 mg Oral BID  . oyster calcium  500 mg of elemental calcium Oral Daily  .  simvastatin  10 mg Oral QHS  . warfarin  2 mg Oral ONCE-1800  . Warfarin - Pharmacist Dosing Inpatient   Does not apply q1800   Continuous Infusions:  PRN Meds:.acetaminophen, ipratropium, levalbuterol  Assessment/Plan:  1. Acute respiratory failure with hypoxia: Patient presented with O2 of 72% on RA, requiring NRB 98%, per EMS. This was multifactorial,  secondary to pneumonia/acute COPD exacerbation. She was managed a s outlined  below, with improvement. As of 03/09/12, Sats are > 90% on 2l. 2. PNA (pneumonia):  Community-acquired pneumonia. Patient has had recent sick contact, with her grandson having had a pneumonia diagnosed about a week ago, and her her daughter has flu-like symptoms. Now on Rocephin/Azithromycin, day#3.  Blood cultures are negative so far, and sputum cultures show normal oro-pharyngeal flora. Patient has been apyrexial overnight. For repeat CXR on 03/11/12. 3. Acute exacerbation of chronic obstructive pulmonary disease (COPD):  This is infective, precipitated by pneumonia. Managing as above, and with nebs, parenteral steroids and Mucinex. She does have a significant past medical history of tobacco abuse. Will continue 4 hourly nebs for now. Leucocytosis is likely secondary to steroids. 4. HYPERTENSION, BENIGN:  BP is uncontrolled today on pre-admission meds. Will add Norvasc to Rx. 5. Atrial fibrillation with RVR:  Patient has a known history of atrial fibrillation, and presented with HR just borderline in the low 100s, likely due to her acute problems, and Albuterol treatment. We continued Metoprolol, changed nebulizers to Xopenex, with satisfactory response. HR is now in the 80s.TSH is 1.223. Pharmacy is dosing her Coumadin and INR is therapeutic.    Comment: Hydration is satisfactory. Have discontinued iv fluids on 03/09/12.   LOS: 2 days   Daejon Lich,CHRISTOPHER 03/10/2012, 4:12 PM

## 2012-03-10 NOTE — Progress Notes (Signed)
ANTICOAGULATION CONSULT NOTE - Follow Up Consult  Pharmacy Consult for Warfarin Indication: atrial fibrillation  Allergies  Allergen Reactions  . Iohexol      Code: HIVES, Desc: hives w/ contrast '08// better w/ benadryl   . Sulfa Antibiotics   . Sulfamethoxazole W/Trimethoprim    Labs:  Basename 03/10/12 1115 03/10/12 0430 03/09/12 0635 03/08/12 1407 03/08/12 1036  HGB -- 13.9 14.7 -- --  HCT -- 41.4 43.6 -- 45.0  PLT -- 219 175 -- 179  APTT -- -- -- -- --  LABPROT 28.9* -- 19.0* 18.5* --  INR 2.67* -- 1.56* 1.51* --  HEPARINUNFRC -- -- -- -- --  CREATININE -- 0.69 0.68 -- 0.70  CKTOTAL -- -- -- -- --  CKMB -- -- -- -- --  TROPONINI -- -- -- -- <0.30   Estimated Creatinine Clearance: 93.8 ml/min (by C-G formula based on Cr of 0.69).   Medications:  Scheduled:     . azithromycin  500 mg Intravenous Q24H  . cefTRIAXone (ROCEPHIN)  IV  1 g Intravenous Q24H  . cholecalciferol  1,000 Units Oral Daily  . guaiFENesin  600 mg Oral BID  . ipratropium  0.5 mg Nebulization Q6H  . levalbuterol  0.63 mg Nebulization Q6H  . methylPREDNISolone (SOLU-MEDROL) injection  60 mg Intravenous Q6H  . metoprolol  100 mg Oral BID  . oyster calcium  500 mg of elemental calcium Oral Daily  . simvastatin  10 mg Oral QHS  . warfarin  2 mg Oral ONCE-1800  . warfarin  5 mg Oral Once  . Warfarin - Pharmacist Dosing Inpatient   Does not apply q1800  . DISCONTD: hydrochlorothiazide  12.5 mg Oral Daily  . DISCONTD: ipratropium  2 puff Inhalation Q4H  . DISCONTD: levalbuterol  2 puff Inhalation Q4H  . DISCONTD: methylPREDNISolone (SOLU-MEDROL) injection  80 mg Intravenous Q12H    Assessment:  71 yo female: chronic Coumadin for Afib  Home Coumadin dose 2.5mg  daily except 5mg  Tu,Sat  Admit with CAP   INR subtherapeutic on admit, 2.67 today after 5, 5mg  of Coumadin. Large jump in INR, adequate po intake.  Azithromycin may increase INR.  Goal of Therapy:  INR 2-3   Plan:   Coumadin 2mg   today,   Continue daily protimes.  Otho Bellows PharmD Pager [phone] 03/10/2012,12:03 PM

## 2012-03-11 ENCOUNTER — Inpatient Hospital Stay (HOSPITAL_COMMUNITY): Payer: Medicare HMO

## 2012-03-11 LAB — BASIC METABOLIC PANEL
Chloride: 106 mEq/L (ref 96–112)
Creatinine, Ser: 0.69 mg/dL (ref 0.50–1.10)
GFR calc Af Amer: >90 mL/min (ref >90)
GFR calc non Af Amer: 86 mL/min — ABNORMAL LOW (ref >90)

## 2012-03-11 LAB — CULTURE, RESPIRATORY W GRAM STAIN: Gram Stain: NONE SEEN

## 2012-03-11 LAB — CBC
MCHC: 32.6 g/dL (ref 30.0–36.0)
MCV: 83.4 fL (ref 78.0–100.0)
Platelets: 200 10*3/uL (ref 150–400)
RDW: 14.6 % (ref 11.5–15.5)
WBC: 11.6 10*3/uL — ABNORMAL HIGH (ref 4.0–10.5)

## 2012-03-11 LAB — PRO B NATRIURETIC PEPTIDE: Pro B Natriuretic peptide (BNP): 1440 pg/mL — ABNORMAL HIGH (ref 0–125)

## 2012-03-11 LAB — PROTIME-INR: INR: 2.85 — ABNORMAL HIGH (ref 0.00–1.49)

## 2012-03-11 MED ORDER — FUROSEMIDE 10 MG/ML IJ SOLN
40.0000 mg | Freq: Once | INTRAMUSCULAR | Status: AC
  Administered 2012-03-11: 40 mg via INTRAVENOUS
  Filled 2012-03-11: qty 4

## 2012-03-11 MED ORDER — WARFARIN SODIUM 2 MG PO TABS
2.0000 mg | ORAL_TABLET | Freq: Once | ORAL | Status: AC
  Administered 2012-03-11: 2 mg via ORAL
  Filled 2012-03-11: qty 1

## 2012-03-11 NOTE — Progress Notes (Signed)
Subjective: Much better today, still wheezy.  Objective: Vital signs in last 24 hours: Temp:  [97.8 F (36.6 C)-99.2 F (37.3 C)] 97.8 F (36.6 C) (03/16 0517) Pulse Rate:  [88-106] 88  (03/16 0517) Resp:  [18-20] 20  (03/16 0517) BP: (139-168)/(73-97) 139/73 mmHg (03/16 0517) SpO2:  [94 %-98 %] 94 % (03/16 1002) Weight change:  Last BM Date: 03/10/12  Intake/Output from previous day: 03/15 0701 - 03/16 0700 In: 480 [P.O.:480] Out: -      Physical Exam: General: Comfortable, alert, communicative, fully oriented, not short of breath at rest.  HEENT:  No clinical pallor, no jaundice, no conjunctival injection or discharge. Hydration status is satisfactory. NECK:  Supple, JVP not seen, no carotid bruits, no palpable lymphadenopathy, no palpable goiter. CHEST:  Bilateral expiratory wheezes, no crackles. HEART:  Sounds 1 and 2 heard, normal, regular, no murmurs. ABDOMEN:  Obese, soft, non-tender, no palpable organomegaly, no palpable masses, normal bowel sounds. GENITALIA:  Not examined. LOWER EXTREMITIES:  No pitting edema, palpable peripheral pulses. MUSCULOSKELETAL SYSTEM:  Generalized osteoarthritic changes, otherwise, normal. CENTRAL NERVOUS SYSTEM:  No focal neurologic deficit on gross examination.  Lab Results:  Basename 03/11/12 0412 03/10/12 0430  WBC 11.6* 15.1*  HGB 12.0 13.9  HCT 36.8 41.4  PLT 200 219    Basename 03/11/12 0412 03/10/12 0430  NA 143 139  K 3.8 4.0  CL 106 101  CO2 30 28  GLUCOSE 158* 165*  BUN 27* 25*  CREATININE 0.69 0.69  CALCIUM 8.9 9.2   Recent Results (from the past 240 hour(s))  CULTURE, BLOOD (ROUTINE X 2)     Status: Normal (Preliminary result)   Collection Time   03/08/12 11:42 AM      Component Value Range Status Comment   Specimen Description BLOOD LEFT HAND   Final    Special Requests BOTTLES DRAWN AEROBIC AND ANAEROBIC 5CC   Final    Culture  Setup Time [id number]   Final    Culture     Final    Value:        BLOOD  CULTURE RECEIVED NO GROWTH TO DATE CULTURE WILL BE HELD FOR 5 DAYS BEFORE ISSUING A FINAL NEGATIVE REPORT   Report Status PENDING   Incomplete   CULTURE, BLOOD (ROUTINE X 2)     Status: Normal (Preliminary result)   Collection Time   03/08/12 11:42 AM      Component Value Range Status Comment   Specimen Description BLOOD LEFT ARM   Final    Special Requests BOTTLES DRAWN AEROBIC AND ANAEROBIC 4CC   Final    Culture  Setup Time [id number]   Final    Culture     Final    Value:        BLOOD CULTURE RECEIVED NO GROWTH TO DATE CULTURE WILL BE HELD FOR 5 DAYS BEFORE ISSUING A FINAL NEGATIVE REPORT   Report Status PENDING   Incomplete   CULTURE, SPUTUM-ASSESSMENT     Status: Normal   Collection Time   03/08/12  7:00 PM      Component Value Range Status Comment   Specimen Description SPUTUM   Final    Special Requests NONE   Final    Sputum evaluation     Final    Value: THIS SPECIMEN IS ACCEPTABLE. RESPIRATORY CULTURE REPORT TO FOLLOW.   Report Status 03/08/2012 FINAL   Final   CULTURE, RESPIRATORY     Status: Normal (Preliminary result)   Collection Time  03/08/12  7:00 PM      Component Value Range Status Comment   Specimen Description SPUTUM   Final    Special Requests NONE   Final    Gram Stain     Final    Value: NO WBC SEEN     NO SQUAMOUS EPITHELIAL CELLS SEEN     RARE GRAM POSITIVE COCCI IN PAIRS   Culture NORMAL OROPHARYNGEAL FLORA   Final    Report Status PENDING   Incomplete      Studies/Results: Dg Chest 2 View  03/11/2012  *RADIOLOGY REPORT*  Clinical Data: Pneumonia  CHEST - 2 VIEW  Comparison: Chest radiograph 03/09/2011, 12/14/2011 CT 03/01/2011 PET CT 09/22/2011  Findings: Stable cardiac silhouette.  There is a left suprahilar density at site of the prior surgical resection which is similar to 12/14/2011.  There is a small left effusion.  No pneumothorax.  No pulmonary edema. No acute osseous abnormality.  IMPRESSION:  1.  Left suprahilar opacity is not significantly  changed from chest radiograph 12/14/2011.  This is at site of prior lung surgery. 2.  No evidence of right pneumonia.  Original Report Authenticated By: Genevive Bi, M.D.    Medications: Scheduled Meds:    . amLODipine  10 mg Oral Daily  . azithromycin  500 mg Intravenous Q24H  . cefTRIAXone (ROCEPHIN)  IV  1 g Intravenous Q24H  . cholecalciferol  1,000 Units Oral Daily  . guaiFENesin  600 mg Oral BID  . ipratropium  0.5 mg Nebulization Q4H  . levalbuterol  0.63 mg Nebulization Q4H  . methylPREDNISolone (SOLU-MEDROL) injection  60 mg Intravenous Q6H  . metoprolol  100 mg Oral BID  . oyster calcium  500 mg of elemental calcium Oral Daily  . simvastatin  10 mg Oral QHS  . warfarin  2 mg Oral ONCE-1800  . Warfarin - Pharmacist Dosing Inpatient   Does not apply q1800  . DISCONTD: ipratropium  0.5 mg Nebulization Q6H  . DISCONTD: levalbuterol  0.63 mg Nebulization Q6H   Continuous Infusions:  PRN Meds:.acetaminophen, ipratropium, levalbuterol  Assessment/Plan:  1. Acute respiratory failure with hypoxia: Patient presented with O2 of 72% on RA, requiring NRB 98%, per EMS. This was multifactorial,  secondary to pneumonia/acute COPD exacerbation. She was managed a s outlined below, with improvement. As of 03/11/12, Sats are > 90% on RA. 2. PNA (pneumonia):  Community-acquired pneumonia. Patient has had recent sick contact, with her grandson having had a pneumonia diagnosed about a week ago, and her daughter has flu-like symptoms. Now on Rocephin/Azithromycin, day#3.  Blood cultures and sputum cultures are negative. Clinical improvement is already evident. Patient has been apyrexial since admission, and feels better. Repeat CXR of 03/11/12, shows left suprahilar opacity is not significantly changed from chest radiograph 12/14/2011, at site of prior lung surgery. No evidence of right pneumonia. 3. Acute exacerbation of chronic obstructive pulmonary disease (COPD):  This is infective,  precipitated by pneumonia. Managing as above, and with Nebs, parenteral steroids and Mucinex. She does have a significant past medical history of tobacco abuse. Although still wheezy today, patient has appreciably improved. Will commence oral steroid taper, from 03/12/12. 4. HYPERTENSION, BENIGN:  BP is currently controlled on pre-admission meds.  5. Atrial fibrillation with RVR:  Patient has a known history of atrial fibrillation, and presented with HR just borderline in the low 100s, likely due to her acute problems, and Albuterol treatment. We have continued Metoprolol, and changed nebulizers to Xopenex, with adequate rate control. TSH  is 1.223. Pharmacy is dosing her Coumadin. As BNP was 1118 on 03/08/12, we shall give a single iv dose of Lasix, and check 2 D Echo. 6. 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 She appears clinically stable at this time, and is scheduled to follow up with Dr Shirline Frees, oncologist, next month, with a chest CT scan. Have discussed with Dr Shirline Frees via telephone today, and he has agreed for me to go ahead and arrange one, during this hospitalization.  Comment: Improving gradually.   LOS: 3 days   Tammie [name],CHRISTOPHER 03/11/2012, 10:54 AM

## 2012-03-11 NOTE — Progress Notes (Signed)
ANTICOAGULATION CONSULT NOTE - Follow Up Consult  Pharmacy Consult for Warfarin Indication: atrial fibrillation  Allergies  Allergen Reactions  . Iohexol      Code: HIVES, Desc: hives w/ contrast '08// better w/ benadryl   . Sulfa Antibiotics   . Sulfamethoxazole W/Trimethoprim    Labs:  Basename 03/11/12 0412 03/10/12 1115 03/10/12 0430 03/09/12 0635  HGB 12.0 -- 13.9 --  HCT 36.8 -- 41.4 43.6  PLT 200 -- 219 175  APTT -- -- -- --  LABPROT 30.4* 28.9* -- 19.0*  INR 2.85* 2.67* -- 1.56*  HEPARINUNFRC -- -- -- --  CREATININE 0.69 -- 0.69 0.68  CKTOTAL -- -- -- --  CKMB -- -- -- --  TROPONINI -- -- -- --   Estimated Creatinine Clearance: 93.8 ml/min (by C-G formula based on Cr of 0.69).   Medications:  Scheduled:     . amLODipine  10 mg Oral Daily  . azithromycin  500 mg Intravenous Q24H  . cefTRIAXone (ROCEPHIN)  IV  1 g Intravenous Q24H  . cholecalciferol  1,000 Units Oral Daily  . furosemide  40 mg Intravenous Once  . guaiFENesin  600 mg Oral BID  . ipratropium  0.5 mg Nebulization Q4H  . levalbuterol  0.63 mg Nebulization Q4H  . methylPREDNISolone (SOLU-MEDROL) injection  60 mg Intravenous Q6H  . metoprolol  100 mg Oral BID  . oyster calcium  500 mg of elemental calcium Oral Daily  . simvastatin  10 mg Oral QHS  . warfarin  2 mg Oral ONCE-1800  . Warfarin - Pharmacist Dosing Inpatient   Does not apply q1800  . DISCONTD: ipratropium  0.5 mg Nebulization Q6H  . DISCONTD: levalbuterol  0.63 mg Nebulization Q6H    Assessment:  71 yo female admitted with CAP, on chronic Coumadin for Afib  Home Coumadin dose 2.5mg  Mon,Wed,Thurs,Fri,Sun, and 5mg  Tu,Sat   INR subtherapeutic on admit, remains therapeutic today but trending up.  Azithromycin may increase INR.  Goal of Therapy:  INR 2-3   Plan:   Repeat coumadin 2mg  today,   Continue daily INRs.  Darrol Angel, PharmD Pager: [phone] 03/11/2012,11:59 AM

## 2012-03-12 ENCOUNTER — Inpatient Hospital Stay (HOSPITAL_COMMUNITY): Payer: Medicare HMO

## 2012-03-12 LAB — PRO B NATRIURETIC PEPTIDE: Pro B Natriuretic peptide (BNP): 1306 pg/mL — ABNORMAL HIGH (ref 0–125)

## 2012-03-12 LAB — BASIC METABOLIC PANEL
BUN: 25 mg/dL — ABNORMAL HIGH (ref 6–23)
Calcium: 9 mg/dL (ref 8.4–10.5)
Chloride: 97 mEq/L (ref 96–112)
Creatinine, Ser: 0.64 mg/dL (ref 0.50–1.10)
GFR calc Af Amer: >90 mL/min (ref >90)
GFR calc non Af Amer: 88 mL/min — ABNORMAL LOW (ref >90)

## 2012-03-12 LAB — CBC
MCH: 27.2 pg (ref 26.0–34.0)
MCHC: 33.1 g/dL (ref 30.0–36.0)
MCV: 82.1 fL (ref 78.0–100.0)
Platelets: 235 10*3/uL (ref 150–400)
RDW: 14.1 % (ref 11.5–15.5)
WBC: 9.7 10*3/uL (ref 4.0–10.5)

## 2012-03-12 MED ORDER — IPRATROPIUM BROMIDE 0.02 % IN SOLN
0.5000 mg | Freq: Four times a day (QID) | RESPIRATORY_TRACT | Status: DC
  Administered 2012-03-12 – 2012-03-13 (×4): 0.5 mg via RESPIRATORY_TRACT
  Filled 2012-03-12 (×3): qty 2.5

## 2012-03-12 MED ORDER — FUROSEMIDE 10 MG/ML IJ SOLN
40.0000 mg | Freq: Once | INTRAMUSCULAR | Status: AC
  Administered 2012-03-12: 40 mg via INTRAVENOUS
  Filled 2012-03-12: qty 4

## 2012-03-12 MED ORDER — LEVALBUTEROL HCL 0.63 MG/3ML IN NEBU
0.6300 mg | INHALATION_SOLUTION | Freq: Four times a day (QID) | RESPIRATORY_TRACT | Status: DC
  Administered 2012-03-12 – 2012-03-13 (×4): 0.63 mg via RESPIRATORY_TRACT
  Filled 2012-03-12 (×7): qty 3

## 2012-03-12 MED ORDER — PREDNISONE 20 MG PO TABS
40.0000 mg | ORAL_TABLET | Freq: Every day | ORAL | Status: DC
  Administered 2012-03-12 – 2012-03-13 (×2): 40 mg via ORAL
  Filled 2012-03-12 (×2): qty 2

## 2012-03-12 NOTE — Discharge Summary (Addendum)
Physician Discharge Summary  Patient ID: Tammie [name] MRN: [id number] DOB/AGE: 71-Jan-1942 71 y.o.  Admit date: 03/08/2012 Discharge date: 03/13/2012  Primary Care Physician:  Dolores Hoose, OTR   Discharge Diagnoses:    Patient Active Problem List  Diagnoses  . HYPERTENSION, BENIGN  . ATRIAL FIBRILLATION  . CVA  . DYSPNEA ON EXERTION  . Nonspecific (abnormal) findings on radiological and other examination of body structure  . CT, CHEST, ABNORMAL  . PULMONARY NODULE  . Asthma  . Cancer  . Acute respiratory failure with hypoxia  . PNA (pneumonia)  . Acute exacerbation of chronic obstructive pulmonary disease (COPD)  . Atrial fibrillation with RVR  . COPD exacerbation  . Pneumonia  . HTN (hypertension)  . Hypoxia    Medication List  As of 03/13/2012 12:59 PM   TAKE these medications         acetaminophen 500 MG tablet   Commonly known as: TYLENOL   Take 1,000 mg by mouth every 6 (six) hours as needed. For pain.      amLODipine 10 MG tablet   Commonly known as: NORVASC   Take 1 tablet (10 mg total) by mouth daily.      calcium carbonate 1250 MG tablet   Commonly known as: OS-CAL - dosed in mg of elemental calcium   Take 1 tablet by mouth daily.      cholecalciferol 1000 UNITS tablet   Commonly known as: VITAMIN D   Take 1,000 Units by mouth daily.      fish oil-omega-3 fatty acids 1000 MG capsule   Take 1 g by mouth daily.      FLAX SEED OIL PO   Take 1 capsule by mouth daily.      Garlic 1000 MG Caps   Take 1 capsule by mouth 2 (two) times daily.      guaiFENesin 600 MG 12 hr tablet   Commonly known as: MUCINEX   Take 1 tablet (600 mg total) by mouth 2 (two) times daily.      hydrochlorothiazide 12.5 MG capsule   Commonly known as: MICROZIDE   Take 2 capsules (25 mg total) by mouth daily.      metoprolol 100 MG tablet   Commonly known as: LOPRESSOR   Take 100 mg by mouth 2 (two) times daily.      moxifloxacin 400 MG tablet   Commonly  known as: AVELOX   Take 1 tablet (400 mg total) by mouth daily.      multivitamin tablet   Take 1 tablet by mouth daily.      potassium chloride SA 20 MEQ tablet   Commonly known as: K-DUR,KLOR-CON   Take 1 tablet (20 mEq total) by mouth daily.      predniSONE 10 MG tablet   Commonly known as: DELTASONE   Take 30 mg daily for 3 days, then 20 mg daily for 3 days, then 10 mg daily for 3 days, then stop.      simvastatin 10 MG tablet   Commonly known as: ZOCOR   Take 10 mg by mouth at bedtime.      warfarin 5 MG tablet   Commonly known as: COUMADIN   Take 2.5-5 mg by mouth See admin instructions. 0.5 tab daily except 1 tab daily on Tuesdays and Saturdays.             Disposition and Follow-up:  Follow up with primary physician, and with primary oncologist.  Consults:  None None.  Significant Diagnostic Studies:  Dg Chest 1 View  03/08/2012  *RADIOLOGY REPORT*  Clinical Data: Shortness of breath.  Prior resection of a left upper lobe adenocarcinoma in October, 2012.  CHEST - 1 VIEW 03/08/2012:  Comparison: Two-view chest x-ray 12/14/2011, 11/02/2011, 10/14/2011 [facility] and 10/06/2011 [facility].  Findings: Cardiac silhouette upper normal in size to slightly enlarged but stable.  Interval development of prominent bronchovascular markings diffusely and marked central peribronchial thickening with associated patchy airspace opacities in the medial right lung base.  Post-surgical changes in the left upper lobe are unchanged.  IMPRESSION: Pneumonia involving the right lung base, superimposed upon severe changes of acute bronchitis and/or asthma.  Stable post-surgical changes in the left upper lobe.  Original Report Authenticated By: Arnell Sieving, M.D.    Brief H and P: For complete details, refer to admission H and P. However, in brief, this is a 71 year old female with past medical history of tobacco abuse quit about 30 years ago, Lung cancer, status post  VATS in October 2012 with biopsy showing invasive moderately differentiated adenocarcinoma T1 N0, previous CVA, atrial fibrillation on chronic anticoagulation and hypertension, presenting with progressive cough and dyspnea on exertion of 4-5 days duration. She went to see her primary MD, was found to be hypoxic, EMS was called and she was brought to the ED. She was admitted for further evaluation, investigation and management..   Physical Exam: On 03/13/12. General: Comfortable, alert, communicative, fully oriented, not short of breath at rest.  HEENT: No clinical pallor, no jaundice, no conjunctival injection or discharge. Hydration status is satisfactory.  NECK: Supple, JVP not seen, no carotid bruits, no palpable lymphadenopathy, no palpable goiter.  CHEST: Only few expiratory rhonchi.  HEART: Sounds 1 and 2 heard, normal, regular, no murmurs.  ABDOMEN: Obese, soft, non-tender, no palpable organomegaly, no palpable masses, normal bowel sounds.  GENITALIA: Not examined.  LOWER EXTREMITIES: No pitting edema, palpable peripheral pulses.  MUSCULOSKELETAL SYSTEM: Generalized osteoarthritic changes, otherwise, normal.  CENTRAL NERVOUS SYSTEM: No focal neurologic deficit on gross examination.  Hospital Course:  1. Acute respiratory failure with hypoxia: Patient presented as described above, and was found to have O2 of 72% on RA, requiring NRB 98%, per EMS. This was multifactorial, secondary to pneumonia/acute COPD exacerbation. She was managed a s outlined below, with improvement. As of 03/11/12, Sats are > 90% on RA. Respiratory failure as of 03/12/12, has resolved.  2. PNA (pneumonia):  This was a community-acquired pneumonia. Patient has had recent sick contact, with her grandson having had a pneumonia diagnosed about a week ago, and her daughter had flu-like symptoms. She was managed with iv Rocephin/Azithromycin, as well as supportive care, with satisfactory clinical response. Blood cultures and  sputum cultures remained negative. Clinical improvement was steady, patient was apyrexial during her hospitalization. Repeat CXR of 03/11/12, showed left suprahilar opacity, not significantly changed from chest radiograph 12/14/2011, at site of prior lung surgery. No evidence of right pneumonia. She completed 4 days of intravenous antibiotics on 03/12/12, and transitioned to oral Avelox, from 03/13/12, for a further 5 days of therapy.  3. Acute exacerbation of chronic obstructive pulmonary disease (COPD):  This is infective, precipitated by pneumonia. She was managed as described above, and with Nebs, parenteral steroids and Mucinex. She does have a significant past medical history of tobacco abuse. As of 03/12/12, exacerbation had clinically resolved. Oral steroid taper was commenced, from 03/12/12. 4. HYPERTENSION, BENIGN:  BP was reasonably controlled on pre-admission meds. Norvasc has  been added to treatment, and HCTZ has been increased to 25 mg daily, on discharge. Defer further management to patient's PMD. 5. Atrial fibrillation with RVR:  Patient has a known history of atrial fibrillation, and presented with HR just borderline in the low 100s, likely due to her acute problems, and Albuterol treatment. We continued Metoprolol, and changed nebulizers to Xopenex, with adequate rate control. TSH was 1.223. Pharmacy is dosing her Coumadin. As BNP was 1118 on 03/08/12. A single iv dose of Lasix was administered on 03/11/12. BNP is now 1306. We shall repeat Lasix today only, as patient may have had mild transient fluid overload. 2 D Echocardiogram was done on 03/13/12, and report is still pending.  6. 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 She appears clinically stable at this time, and is scheduled to follow up with Dr Shirline Frees, oncologist, next month, with a chest CT scan. Have discussed with Dr  Shirline Frees via telephone on 03/11/12, and he has agreed for me to go ahead and arrange one, during this hospitalization. Chest CT was done on 03/12/12 an showed interval increase in fine ground-glass opacities in both the left and right lower lobe. Differential includes low grade adenocarcinoma versus pulmonary infection. Postsurgical change in the left upper lobe with nodularity. No comparison available. This will serve as a baseline for follow up. The Ground-glass nodules in the right upper lobe and superior segment right lower lobe are unchanged.  Comment: Patient is stable for discharge on 03/13/12.  Time spent on Discharge: 45 mins.  Signed: Ahijah Devery,CHRISTOPHER 03/13/2012, 12:59 PM

## 2012-03-12 NOTE — Progress Notes (Signed)
ANTICOAGULATION CONSULT NOTE - Follow Up Consult  Pharmacy Consult for Warfarin Indication: atrial fibrillation  Allergies  Allergen Reactions  . Iohexol      Code: HIVES, Desc: hives w/ contrast '08// better w/ benadryl   . Sulfa Antibiotics   . Sulfamethoxazole W/Trimethoprim    Labs:  Basename 03/12/12 0437 03/11/12 0412 03/10/12 1115 03/10/12 0430  HGB 12.9 12.0 -- --  HCT 39.0 36.8 -- 41.4  PLT 235 200 -- 219  APTT -- -- -- --  LABPROT 33.4* 30.4* 28.9* --  INR 3.22* 2.85* 2.67* --  HEPARINUNFRC -- -- -- --  CREATININE 0.64 0.69 -- 0.69  CKTOTAL -- -- -- --  CKMB -- -- -- --  TROPONINI -- -- -- --   Estimated Creatinine Clearance: 93.8 ml/min (by C-G formula based on Cr of 0.64).   Medications:  Scheduled:     . amLODipine  10 mg Oral Daily  . azithromycin  500 mg Intravenous Q24H  . cefTRIAXone (ROCEPHIN)  IV  1 g Intravenous Q24H  . cholecalciferol  1,000 Units Oral Daily  . furosemide  40 mg Intravenous Once  . furosemide  40 mg Intravenous Once  . guaiFENesin  600 mg Oral BID  . ipratropium  0.5 mg Nebulization Q6H  . levalbuterol  0.63 mg Nebulization Q6H  . metoprolol  100 mg Oral BID  . oyster calcium  500 mg of elemental calcium Oral Daily  . predniSONE  40 mg Oral Q breakfast  . simvastatin  10 mg Oral QHS  . warfarin  2 mg Oral ONCE-1800  . Warfarin - Pharmacist Dosing Inpatient   Does not apply q1800  . DISCONTD: ipratropium  0.5 mg Nebulization Q4H  . DISCONTD: levalbuterol  0.63 mg Nebulization Q4H  . DISCONTD: methylPREDNISolone (SOLU-MEDROL) injection  60 mg Intravenous Q6H    Assessment:  71 yo female admitted with CAP, on chronic Coumadin for Afib  Home Coumadin dose 2.5mg  Mon,Wed,Thurs,Fri,Sun, and 5mg  Tu,Sat   INR supratherapeutic today, no bleeding reported in chart notes.  Azithromycin may increase INR. (as will Avelox, if/when abx changed)  Goal of Therapy:  INR 2-3   Plan:   No warfarin today   Continue daily  INRs.  Darrol Angel, PharmD Pager: [phone] 03/12/2012,12:11 PM

## 2012-03-12 NOTE — Progress Notes (Signed)
Subjective: No longer wheezy, had a good night. No new issues.  Objective: Vital signs in last 24 hours: Temp:  [98 F (36.7 C)-98.7 F (37.1 C)] 98.6 F (37 C) (03/17 0620) Pulse Rate:  [65-103] 103  (03/17 0620) Resp:  [16-18] 18  (03/17 0620) BP: (127-163)/(67-73) 163/73 mmHg (03/17 0620) SpO2:  [93 %-98 %] 94 % (03/17 0907) Weight change:  Last BM Date: 03/11/12  Intake/Output from previous day: 03/16 0701 - 03/17 0700 In: 1440 [P.O.:1440] Out: 2550 [Urine:2550]     Physical Exam: General: Comfortable, alert, communicative, fully oriented, not short of breath at rest.  HEENT:  No clinical pallor, no jaundice, no conjunctival injection or discharge. Hydration status is satisfactory. NECK:  Supple, JVP not seen, no carotid bruits, no palpable lymphadenopathy, no palpable goiter. CHEST:  Only few expiratory rhonchi. HEART:  Sounds 1 and 2 heard, normal, regular, no murmurs. ABDOMEN:  Obese, soft, non-tender, no palpable organomegaly, no palpable masses, normal bowel sounds. GENITALIA:  Not examined. LOWER EXTREMITIES:  No pitting edema, palpable peripheral pulses. MUSCULOSKELETAL SYSTEM:  Generalized osteoarthritic changes, otherwise, normal. CENTRAL NERVOUS SYSTEM:  No focal neurologic deficit on gross examination.  Lab Results:  [facility] 03/12/12 0437 03/11/12 0412  WBC 9.7 11.6*  HGB 12.9 12.0  HCT 39.0 36.8  PLT 235 200    Basename 03/12/12 0437 03/11/12 0412  NA 139 143  K 3.6 3.8  CL 97 106  CO2 32 30  GLUCOSE 166* 158*  BUN 25* 27*  CREATININE 0.64 0.69  CALCIUM 9.0 8.9   Recent Results (from the past 240 hour(s))  CULTURE, BLOOD (ROUTINE X 2)     Status: Normal (Preliminary result)   Collection Time   03/08/12 11:42 AM      Component Value Range Status Comment   Specimen Description BLOOD LEFT HAND   Final    Special Requests BOTTLES DRAWN AEROBIC AND ANAEROBIC 5CC   Final    Culture  Setup Time [id number]   Final    Culture     Final    Value:         BLOOD CULTURE RECEIVED NO GROWTH TO DATE CULTURE WILL BE HELD FOR 5 DAYS BEFORE ISSUING A FINAL NEGATIVE REPORT   Report Status PENDING   Incomplete   CULTURE, BLOOD (ROUTINE X 2)     Status: Normal (Preliminary result)   Collection Time   03/08/12 11:42 AM      Component Value Range Status Comment   Specimen Description BLOOD LEFT ARM   Final    Special Requests BOTTLES DRAWN AEROBIC AND ANAEROBIC 4CC   Final    Culture  Setup Time [id number]   Final    Culture     Final    Value:        BLOOD CULTURE RECEIVED NO GROWTH TO DATE CULTURE WILL BE HELD FOR 5 DAYS BEFORE ISSUING A FINAL NEGATIVE REPORT   Report Status PENDING   Incomplete   CULTURE, SPUTUM-ASSESSMENT     Status: Normal   Collection Time   03/08/12  7:00 PM      Component Value Range Status Comment   Specimen Description SPUTUM   Final    Special Requests NONE   Final    Sputum evaluation     Final    Value: THIS SPECIMEN IS ACCEPTABLE. RESPIRATORY CULTURE REPORT TO FOLLOW.   Report Status 03/08/2012 FINAL   Final   CULTURE, RESPIRATORY     Status: Normal   Collection  Time   03/08/12  7:00 PM      Component Value Range Status Comment   Specimen Description SPUTUM   Final    Special Requests NONE   Final    Gram Stain     Final    Value: NO WBC SEEN     NO SQUAMOUS EPITHELIAL CELLS SEEN     RARE GRAM POSITIVE COCCI IN PAIRS   Culture NORMAL OROPHARYNGEAL FLORA   Final    Report Status 03/11/2012 FINAL   Final      Studies/Results: Dg Chest 2 View  03/11/2012  *RADIOLOGY REPORT*  Clinical Data: Pneumonia  CHEST - 2 VIEW  Comparison: Chest radiograph 03/09/2011, 12/14/2011 CT 03/01/2011 PET CT 09/22/2011  Findings: Stable cardiac silhouette.  There is a left suprahilar density at site of the prior surgical resection which is similar to 12/14/2011.  There is a small left effusion.  No pneumothorax.  No pulmonary edema. No acute osseous abnormality.  IMPRESSION:  1.  Left suprahilar opacity is not significantly  changed from chest radiograph 12/14/2011.  This is at site of prior lung surgery. 2.  No evidence of right pneumonia.  Original Report Authenticated By: Genevive Bi, M.D.    Medications: Scheduled Meds:    . amLODipine  10 mg Oral Daily  . azithromycin  500 mg Intravenous Q24H  . cefTRIAXone (ROCEPHIN)  IV  1 g Intravenous Q24H  . cholecalciferol  1,000 Units Oral Daily  . furosemide  40 mg Intravenous Once  . guaiFENesin  600 mg Oral BID  . ipratropium  0.5 mg Nebulization Q4H  . levalbuterol  0.63 mg Nebulization Q4H  . metoprolol  100 mg Oral BID  . oyster calcium  500 mg of elemental calcium Oral Daily  . predniSONE  40 mg Oral Q breakfast  . simvastatin  10 mg Oral QHS  . warfarin  2 mg Oral ONCE-1800  . Warfarin - Pharmacist Dosing Inpatient   Does not apply q1800  . DISCONTD: methylPREDNISolone (SOLU-MEDROL) injection  60 mg Intravenous Q6H   Continuous Infusions:  PRN Meds:.acetaminophen, ipratropium, levalbuterol  Assessment/Plan:  1. Acute respiratory failure with hypoxia: Patient presented with O2 of 72% on RA, requiring NRB 98%, per EMS. This was multifactorial,  secondary to pneumonia/acute COPD exacerbation. She was managed a s outlined below, with improvement. As of 03/11/12, Sats are > 90% on RA. Now resolved as of 03/12/12. 2. PNA (pneumonia):  Community-acquired pneumonia. Patient has had recent sick contact, with her grandson having had a pneumonia diagnosed about a week ago, and her daughter has flu-like symptoms. Now on Rocephin/Azithromycin, day#4.  Blood cultures and sputum cultures are negative. Clinical improvement is already evident. Patient has been apyrexial since admission, and feels better. Repeat CXR of 03/11/12, shows left suprahilar opacity is not significantly changed from chest radiograph 12/14/2011, at site of prior lung surgery. No evidence of right pneumonia. We shall change antibiotics to oral Avelox, from 03/13/12, for a further 5 days of  therapy. 3. Acute exacerbation of chronic obstructive pulmonary disease (COPD):  This is infective, precipitated by pneumonia. Managing as above, and with Nebs, parenteral steroids and Mucinex. She does have a significant past medical history of tobacco abuse. Although still wheezy today, patient has appreciably improved. Oral steroid taper commenced from, from 03/12/12. 4. HYPERTENSION, BENIGN:  BP is currently controlled on pre-admission meds.  5. Atrial fibrillation with RVR:  Patient has a known history of atrial fibrillation, and presented with HR just borderline in the  low 100s, likely due to her acute problems, and Albuterol treatment. We have continued Metoprolol, and changed nebulizers to Xopenex, with adequate rate control. TSH is 1.223. Pharmacy is dosing her Coumadin. As BNP was 1118 on 03/08/12. A single iv dose of Lasix was administered on 03/11/12. BNP is now 1306. We shall repeat Lasix today only, as patient may have had mild transient fluid overload. 2 D Echo is pending.. 6. 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 She appears clinically stable at this time, and is scheduled to follow up with Dr Shirline Frees, oncologist, next month, with a chest CT scan. Have discussed with Dr Shirline Frees via telephone on 03/11/12., and he has agreed for me to go ahead and arrange one, during this hospitalization. Chest CT was done on 03/12/12.  Comment: Aiming discharge on 03/13/12.   LOS: 4 days   Gelila Well,CHRISTOPHER 03/12/2012, 9:26 AM

## 2012-03-13 DIAGNOSIS — J449 Chronic obstructive pulmonary disease, unspecified: Secondary | ICD-10-CM | POA: Diagnosis present

## 2012-03-13 DIAGNOSIS — R0902 Hypoxemia: Secondary | ICD-10-CM | POA: Diagnosis present

## 2012-03-13 DIAGNOSIS — I10 Essential (primary) hypertension: Secondary | ICD-10-CM | POA: Diagnosis present

## 2012-03-13 DIAGNOSIS — J189 Pneumonia, unspecified organism: Secondary | ICD-10-CM | POA: Diagnosis present

## 2012-03-13 LAB — CBC
HCT: 39.9 % (ref 36.0–46.0)
Hemoglobin: 13.4 g/dL (ref 12.0–15.0)
MCHC: 33.6 g/dL (ref 30.0–36.0)
MCV: 82.1 fL (ref 78.0–100.0)
RDW: 13.9 % (ref 11.5–15.5)

## 2012-03-13 LAB — BASIC METABOLIC PANEL
BUN: 27 mg/dL — ABNORMAL HIGH (ref 6–23)
Creatinine, Ser: 0.64 mg/dL (ref 0.50–1.10)
GFR calc Af Amer: >90 mL/min (ref >90)
GFR calc non Af Amer: 88 mL/min — ABNORMAL LOW (ref >90)
Glucose, Bld: 139 mg/dL — ABNORMAL HIGH (ref 70–99)

## 2012-03-13 MED ORDER — HYDROCHLOROTHIAZIDE 12.5 MG PO CAPS: 25.0000 mg | ORAL_CAPSULE | Freq: Every day | ORAL | Status: DC

## 2012-03-13 MED ORDER — AMLODIPINE BESYLATE 10 MG PO TABS: 10.0000 mg | ORAL_TABLET | Freq: Every day | ORAL | Status: DC

## 2012-03-13 MED ORDER — POTASSIUM CHLORIDE CRYS ER 20 MEQ PO TBCR: 20.0000 meq | EXTENDED_RELEASE_TABLET | Freq: Every day | ORAL | Status: DC

## 2012-03-13 MED ORDER — POTASSIUM CHLORIDE CRYS ER 20 MEQ PO TBCR
40.0000 meq | EXTENDED_RELEASE_TABLET | Freq: Once | ORAL | Status: AC
  Administered 2012-03-13: 40 meq via ORAL
  Filled 2012-03-13: qty 2

## 2012-03-13 MED ORDER — PREDNISONE 10 MG PO TABS: ORAL_TABLET | ORAL | Status: DC

## 2012-03-13 MED ORDER — GUAIFENESIN ER 600 MG PO TB12: 600.0000 mg | ORAL_TABLET | Freq: Two times a day (BID) | ORAL | Status: DC

## 2012-03-13 MED ORDER — MOXIFLOXACIN HCL 400 MG PO TABS: 400.0000 mg | ORAL_TABLET | Freq: Every day | ORAL | Status: AC

## 2012-03-13 NOTE — Progress Notes (Addendum)
Pt. States feeling fairly well, states she actually got some sleep last night. Denies having any Diarrhea since 3/17. Appetite is adequate. Daughter stayed the night with her. Pt. Ambulating to the bathroom on her own, tolerates well. No complaints or concerns voiced. Waiting for the MD to discharge her today. Vaccines up to date.  Discharge instructions given to pt. And her daughter, also scripts given to the pt.

## 2012-03-13 NOTE — Progress Notes (Signed)
Pt. Ambulating frequently in her room. Denies any nausea or diarrhea this AM. Appetite good. Daughter in with pt.. Discharge instructions and prescriptions explained to daughter and pt. Pt states she was ready to go home 2 days ago.

## 2012-03-13 NOTE — Progress Notes (Signed)
  Echocardiogram 2D Echocardiogram has been performed.  Stephano Arrants L 03/13/2012, 9:23 AM

## 2012-03-13 NOTE — Plan of Care (Signed)
Problem: Discharge Progression Outcomes Goal: Discharge plan in place and appropriate Outcome: Completed/Met Date Met:  03/13/12 PT dc'd to daughter, going home. Goal: O2 sats at patient's baseline Outcome: Completed/Met Date Met:  03/13/12 98% @ ra.

## 2012-03-13 NOTE — Plan of Care (Signed)
Problem: Discharge Progression Outcomes Goal: Other Discharge Outcomes/Goals Outcome: Completed/Met Date Met:  03/13/12 Prescriptions given with dc instructions

## 2012-03-14 LAB — CULTURE, BLOOD (ROUTINE X 2)
Culture  Setup Time: 201303131547
Culture: NO GROWTH
Culture: NO GROWTH

## 2012-03-28 ENCOUNTER — Telehealth: Payer: Self-pay | Admitting: Internal Medicine

## 2012-03-28 ENCOUNTER — Other Ambulatory Visit: Payer: Self-pay | Admitting: *Deleted

## 2012-03-28 NOTE — Telephone Encounter (Signed)
S/w cheryl from rad scheduling to cancel the pt's ct scan appt for April since the pt had it done in the hospital

## 2012-04-21 ENCOUNTER — Other Ambulatory Visit: Payer: Medicare HMO | Admitting: Lab

## 2012-04-21 ENCOUNTER — Other Ambulatory Visit (HOSPITAL_COMMUNITY): Payer: Medicare HMO

## 2012-04-26 ENCOUNTER — Ambulatory Visit (HOSPITAL_BASED_OUTPATIENT_CLINIC_OR_DEPARTMENT_OTHER): Payer: Medicare HMO | Admitting: Internal Medicine

## 2012-04-26 ENCOUNTER — Other Ambulatory Visit: Payer: Self-pay | Admitting: *Deleted

## 2012-04-26 VITALS — BP 132/67 | HR 98 | Temp 97.7°F | Ht 71.0 in | Wt 276.4 lb

## 2012-04-26 DIAGNOSIS — C3410 Malignant neoplasm of upper lobe, unspecified bronchus or lung: Secondary | ICD-10-CM

## 2012-04-26 DIAGNOSIS — C801 Malignant (primary) neoplasm, unspecified: Secondary | ICD-10-CM

## 2012-04-26 DIAGNOSIS — C3490 Malignant neoplasm of unspecified part of unspecified bronchus or lung: Secondary | ICD-10-CM

## 2012-04-26 NOTE — Progress Notes (Signed)
[facility] [phone]   [phone]  OFFICE PROGRESS NOTE  [name], OTR, OTR [address] 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 The patient also has bilateral groundglass opacities suspicious for low-grade adenocarcinoma.  PRIOR THERAPY: Status post left VATS with wedge resection of the left upper lobe lesion and node sampling under the care of Dr. Edwyna Shell on 10/01/2011  CURRENT THERAPY: Observation.  INTERVAL HISTORY: Tammie [name] 71 y.o. female returns to the clinic today for six-month followup visit. The patient is doing fine today with no specific complaints. She was diagnosed with questionable pneumonia in March of 2013. CT scan of the chest was performed at that time and it showed Interval increase in fine ground-glass opacities in both the left and right lower lobe. Differential includes low grade  adenocarcinoma versus pulmonary infection. Postsurgical change in the left upper lobe with nodularity. No comparison available. This will serve as a baseline for follow up. Ground-glass nodules in the right upper lobe and superior segment right lower lobe are unchanged. The patient is feeling fine today with no specific complaints except for mild cough. She continues to have shortness breath with exertion. She has no significant weight loss or night sweats.   MEDICAL HISTORY: Past Medical History  Diagnosis Date  . Asthma   . Fibrocystic disease of breast   . Mini stroke     x2. Dr. Jefm Miles  / Dr. Lenora Boys   . Hematuria     Dr. Porfirio Mylar    . Renal calculi   . Hyperlipidemia   . Obesity   . Atrial fibrillation     on coumadin   . Hypertension   . Arthritis   . Seasonal allergies   . Nephrolithiasis   . Cystic disease of breast   . Cancer  10/01/11    ADENOCARCINOMA  LUNG    ALLERGIES:  is allergic to iohexol; sulfa antibiotics; and sulfamethoxazole w-trimethoprim.  MEDICATIONS:  Current Outpatient Prescriptions  Medication Sig Dispense Refill  . acetaminophen (TYLENOL) 500 MG tablet Take 1,000 mg by mouth every 6 (six) hours as needed. For pain.      . ALBUTEROL IN Inhale 90 mcg into the lungs 2 (two) times daily.      . calcium carbonate (OS-CAL - DOSED IN MG OF ELEMENTAL CALCIUM) 1250 MG tablet Take 1 tablet by mouth daily.      . cholecalciferol (VITAMIN D) 1000 UNITS tablet Take 1,000 Units by mouth daily.       . fish oil-omega-3 fatty acids 1000 MG capsule Take 1 g by mouth daily.       . Flaxseed, Linseed, (FLAX SEED OIL PO) Take 1 capsule by mouth daily.       . Garlic 1000 MG CAPS Take 1 capsule by mouth 2 (two) times daily.       . hydrochlorothiazide (MICROZIDE) 12.5 MG capsule Take 2 capsules (25 mg total) by mouth daily.  30 capsule  0  . metoprolol (LOPRESSOR) 100 MG tablet Take 100 mg by mouth 2 (two) times daily.      . Multiple Vitamin (MULTIVITAMIN) tablet Take 1 tablet by mouth daily.       . simvastatin (ZOCOR) 10 MG tablet Take 10 mg by mouth at bedtime.        Marland Kitchen  warfarin (COUMADIN) 5 MG tablet Take 2.5-5 mg by mouth See admin instructions. 0.5 tab daily except 1 tab daily on Tuesdays and Saturdays.      Marland Kitchen amLODipine (NORVASC) 10 MG tablet Take 1 tablet (10 mg total) by mouth daily.  30 tablet  0  . guaiFENesin (MUCINEX) 600 MG 12 hr tablet Take 1 tablet (600 mg total) by mouth 2 (two) times daily.  14 tablet  0  . potassium chloride SA (K-DUR,KLOR-CON) 20 MEQ tablet Take 1 tablet (20 mEq total) by mouth daily.  30 tablet  0  . predniSONE (DELTASONE) 10 MG tablet Take 30 mg daily for 3 days, then 20 mg daily for 3 days, then 10 mg daily for 3 days, then stop.  18 tablet  0  . DISCONTD: Calcium Citrate-Vitamin D (CALCIUM CITRATE + D) 300-100 MG-UNIT TABS Take 0.5 tablets by mouth 2 (two) times daily.          Marland Kitchen DISCONTD: metoprolol (TOPROL-XL) 100 MG 24 hr tablet Take 100 mg by mouth daily.          SURGICAL HISTORY:  Past Surgical History  Procedure Date  . Tonsillectomy 50  . Kidney stones [age]  . Cyst of  left breast and right breast     Dr. Irena Reichmann   . Abdominal hysterectomy 1990    Dr. Colon Branch   . Dilation and curettage of uterus   . Lung cancer surgery 10/01/11  DR.BURNEY    (L)VATS,ANT. MINI THORACOTOMY, WEDGE RESECTION OF LULOBE LESION WITH NODWE SAMPLING    REVIEW OF SYSTEMS:  A comprehensive review of systems was negative except for: Respiratory: positive for cough and dyspnea on exertion   PHYSICAL EXAMINATION: General appearance: alert, cooperative and no distress Head: Normocephalic, without obvious abnormality, atraumatic Neck: no adenopathy Lymph nodes: Cervical, supraclavicular, and axillary nodes normal. Resp: clear to auscultation bilaterally Cardio: regular rate and rhythm, S1, S2 normal, no murmur, click, rub or gallop GI: soft, non-tender; bowel sounds normal; no masses,  no organomegaly Extremities: extremities normal, atraumatic, no cyanosis or edema Neurologic: Alert and oriented X 3, normal strength and tone. Normal symmetric reflexes. Normal coordination and gait  ECOG PERFORMANCE STATUS: 1 - Symptomatic but completely ambulatory  Blood pressure 132/67, pulse 98, temperature 97.7 F (36.5 C), temperature source Oral, height 5\' 11"  (1.803 m), weight 276 lb 6.4 oz (125.374 kg).  LABORATORY DATA: Lab Results  Component Value Date   WBC 10.4 03/13/2012   HGB 13.4 03/13/2012   HCT 39.9 03/13/2012   MCV 82.1 03/13/2012   PLT COUNT MAY BE INACCURATE DUE TO FIBRIN CLUMPS. 03/13/2012      Chemistry      Component Value Date/Time   NA 140 03/13/2012 0405   K 3.2* 03/13/2012 0405   CL 97 03/13/2012 0405   CO2 35* 03/13/2012 0405   BUN 27* 03/13/2012 0405   CREATININE 0.64 03/13/2012 0405      Component Value Date/Time   CALCIUM 8.6 03/13/2012 0405   ALKPHOS 54  03/09/2012 0635   AST 23 03/09/2012 0635   ALT 14 03/09/2012 0635   BILITOT 0.2* 03/09/2012 0635       RADIOGRAPHIC STUDIES: CT CHEST WITHOUT CONTRAST on 03/12/2012. Technique: Multidetector CT imaging of the chest was performed  following the standard protocol without IV contrast.  Comparison: CT 09/06/2011, PET CT 09/22/2011  Findings: There is postsurgical change in the left upper lobe  following resection of left upper lobe nodule. There is thickening  along the  resection margin with no comparison for stability of  thickening.  Ground-glass nodules in the left lower lobe (image 35) similar to  prior. Ground-glass nodule in the right upper lobe measuring 10 mm  is not changed 10 mm on prior. Ground-glass nodule in the right  lower lobe measures 16 x 11 mm similar to 18 x 13 mm on prior.  Ill-defined ground-glass opacities in the more inferior right lower  lobe (image 41) are increased compared to prior. This could  represent early pneumonia. Similar findings in the left lower lobe  (image 36).  No axillary or supraclavicular lymphadenopathy. No mediastinal or  hilar lymphadenopathy.  Limited view upper abdomen shows a gallstone the gallbladder.  Adrenal glands are normal. Simple cyst within the left kidney.  Review of the skeleton is unremarkable.  IMPRESSION:  1. Interval increase in fine ground-glass opacities in both the  left and right lower lobe. Differential includes low grade  adenocarcinoma versus pulmonary infection.  2. Postsurgical change in the left upper lobe with nodularity. No  comparison available. This will serve as a baseline for follow up.  3. Ground-glass nodules in the right upper lobe and superior  segment right lower lobe are unchanged.  Original Report Authenticated By: Genevive Bi, M.D.        ASSESSMENT: This is a very pleasant 71 years old white female with history of stage IA non-small cell lung cancer but the patient also has bilateral  groundglass opacities suspicious for bronchoalveolar carcinoma. There is slight elevation of one or 2 of the nodules but the rest of her scan is stable. I discussed the scan results with the patient.  PLAN: I gave the patient the option of considering systemic chemotherapy at this point versus continuous observation. After discussion of the 2 options, we'll plan to continuous observation for now with repeat CT scan of the chest in 6 months. She was advised to call me immediately if she has any concerning symptoms in the interval.  All questions were answered. The patient knows to call the clinic with any problems, questions or concerns. We can certainly see the patient much sooner if necessary.

## 2012-04-27 ENCOUNTER — Telehealth: Payer: Self-pay | Admitting: Internal Medicine

## 2012-04-27 NOTE — Telephone Encounter (Signed)
called pt and provided appts for oct2013 along with ct scan on 10/29 @ [facility]

## 2012-05-02 ENCOUNTER — Ambulatory Visit: Payer: Medicare HMO | Admitting: Thoracic Surgery

## 2012-05-03 ENCOUNTER — Ambulatory Visit: Payer: Medicare HMO | Admitting: Thoracic Surgery

## 2012-05-03 ENCOUNTER — Ambulatory Visit (INDEPENDENT_AMBULATORY_CARE_PROVIDER_SITE_OTHER): Payer: Medicare HMO | Admitting: Thoracic Surgery

## 2012-05-03 ENCOUNTER — Encounter: Payer: Self-pay | Admitting: Thoracic Surgery

## 2012-05-03 VITALS — BP 109/70 | HR 84 | Resp 20 | Ht 71.0 in | Wt 270.0 lb

## 2012-05-03 DIAGNOSIS — Z9889 Other specified postprocedural states: Secondary | ICD-10-CM

## 2012-05-03 DIAGNOSIS — C3410 Malignant neoplasm of upper lobe, unspecified bronchus or lung: Secondary | ICD-10-CM

## 2012-05-03 NOTE — Progress Notes (Signed)
HPI patient returns for followup she had a recent CT scan mouth when she had a pneumonia in the right lower lobe. CT scan showed her groundglass nodules Rawl stable several one in the right lower lobe that increased from 16-18 mm. Were not continue to follow this the Dr. Gwenyth Bouillon will get another CT scan in 6 months. Other than the pneumonia she is been stable she's had some occasional back pain and informed her that Dr. Gwenyth Bouillon continue to follow her.   Current Outpatient Prescriptions  Medication Sig Dispense Refill  . acetaminophen (TYLENOL) 500 MG tablet Take 1,000 mg by mouth every 6 (six) hours as needed. For pain.      . ALBUTEROL IN Inhale 90 mcg into the lungs 2 (two) times daily.      . calcium carbonate (OS-CAL - DOSED IN MG OF ELEMENTAL CALCIUM) 1250 MG tablet Take 1 tablet by mouth daily.      . cholecalciferol (VITAMIN D) 1000 UNITS tablet Take 1,000 Units by mouth daily.       . fish oil-omega-3 fatty acids 1000 MG capsule Take 1 g by mouth daily.       . Flaxseed, Linseed, (FLAX SEED OIL PO) Take 1 capsule by mouth daily.       . Garlic 1000 MG CAPS Take 1 capsule by mouth 2 (two) times daily.       . hydrochlorothiazide (MICROZIDE) 12.5 MG capsule Take 2 capsules (25 mg total) by mouth daily.  30 capsule  0  . metoprolol (LOPRESSOR) 100 MG tablet Take 100 mg by mouth 2 (two) times daily.      . Multiple Vitamin (MULTIVITAMIN) tablet Take 1 tablet by mouth daily.       . potassium chloride SA (K-DUR,KLOR-CON) 20 MEQ tablet Take 1 tablet (20 mEq total) by mouth daily.  30 tablet  0  . warfarin (COUMADIN) 5 MG tablet Take 2.5-5 mg by mouth See admin instructions. 0.5 tab daily except 1 tab daily on Tuesdays and Saturdays.      Marland Kitchen DISCONTD: Calcium Citrate-Vitamin D (CALCIUM CITRATE + D) 300-100 MG-UNIT TABS Take 0.5 tablets by mouth 2 (two) times daily.        Marland Kitchen DISCONTD: metoprolol (TOPROL-XL) 100 MG 24 hr tablet Take 100 mg by mouth daily.           Review of Systems: Recent  pneumonia back pain   Physical Exam lungs are clear attestation   Diagnostic Tests: CT scan shows questionable slight enlargement of right lower lobe groundglass nodule.   Impression: Status post resection of adenocarcinoma in situ left upper lobe   Plan: Followup by Dr. Gwenyth Bouillon

## 2012-06-17 IMAGING — CT CT CHEST W/O CM
2 of 3 series · 13 of 30 positions shown, 15 images · non-contrast
Comparison: 08/20/2010

CLINICAL DATA: Followup right upper lobe density.  Ex-smoker.
Cough and shortness of breath.

CT CHEST WITHOUT CONTRAST
TECHNIQUE: Multidetector CT imaging of the chest was performed
following the standard protocol without IV contrast.

[Series 3: routine chest · axial · 0.70mm/px · z∈[-238,-78]mm · 5 of 49 slices shown, 7 images]
[im 9/49  mediastinal]
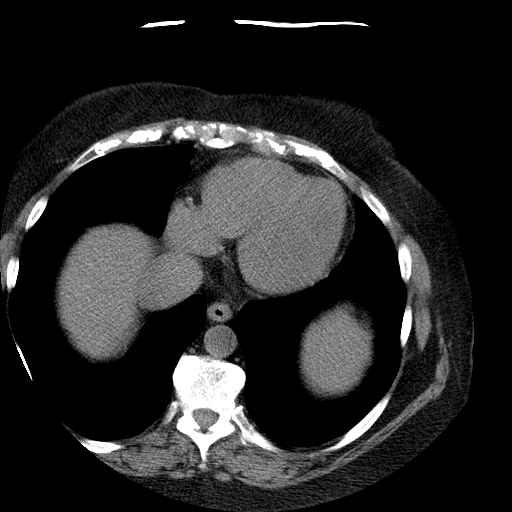
[im 9/49  lung]
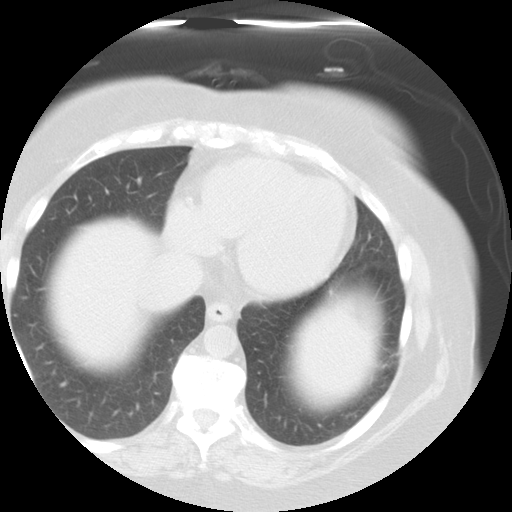
[im 17/49  lung]
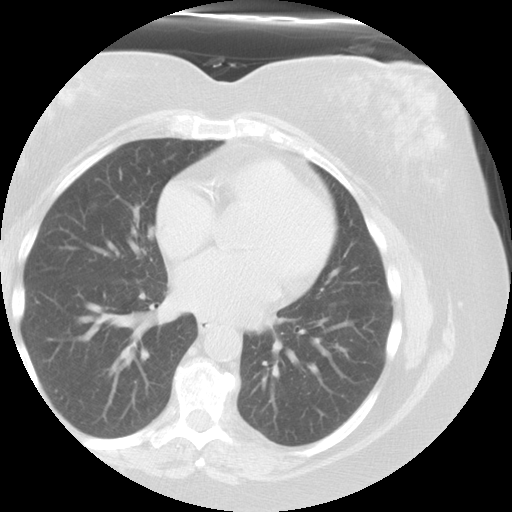
[im 25/49  lung]
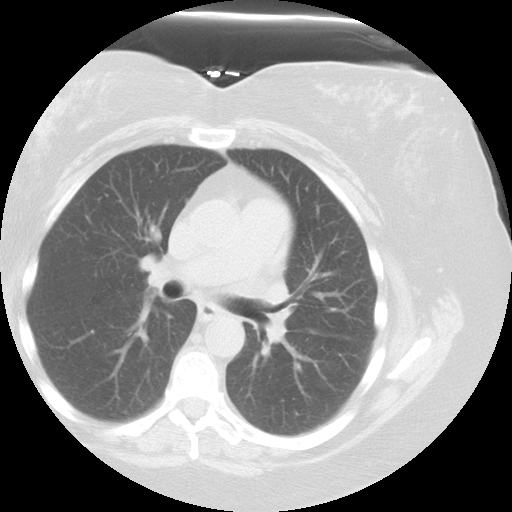
[im 33/49  lung]
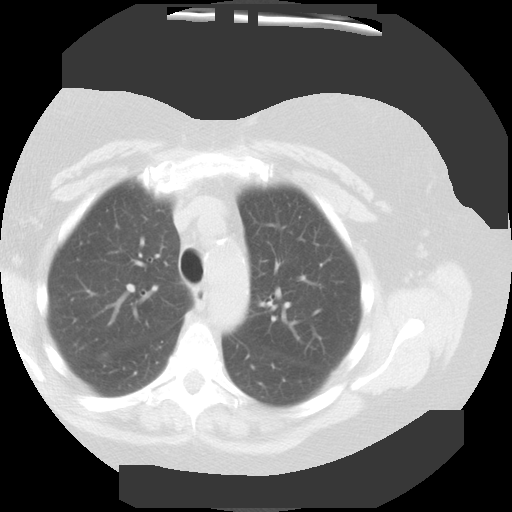
[im 41/49  mediastinal]
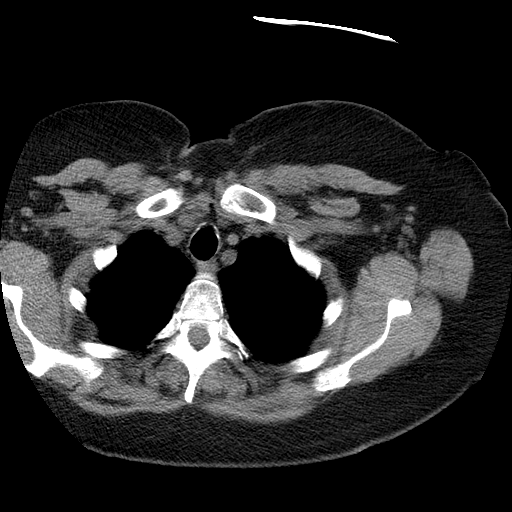
[im 41/49  lung]
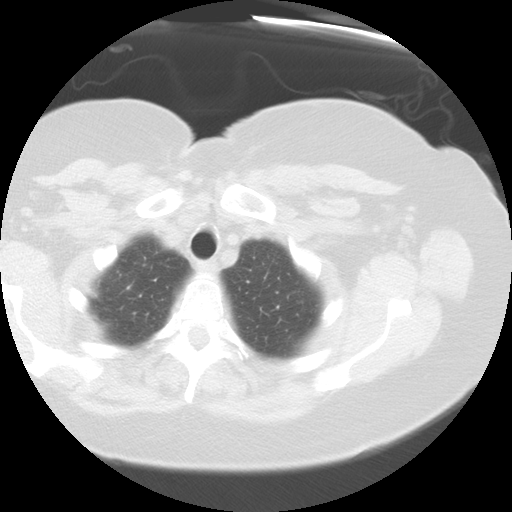

[Series 602: sagittal body · sagittal · 0.66mm/px · 8 of 137 slices shown]
[im 14/137  mediastinal]
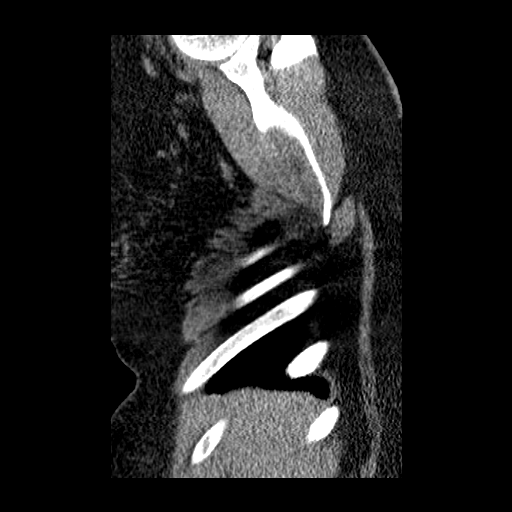
[im 28/137  mediastinal]
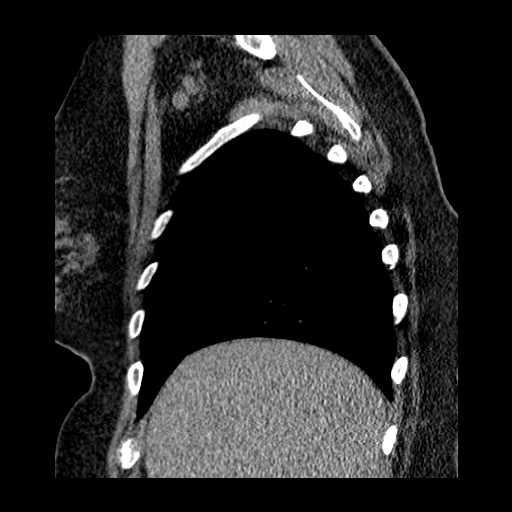
[im 41/137  mediastinal]
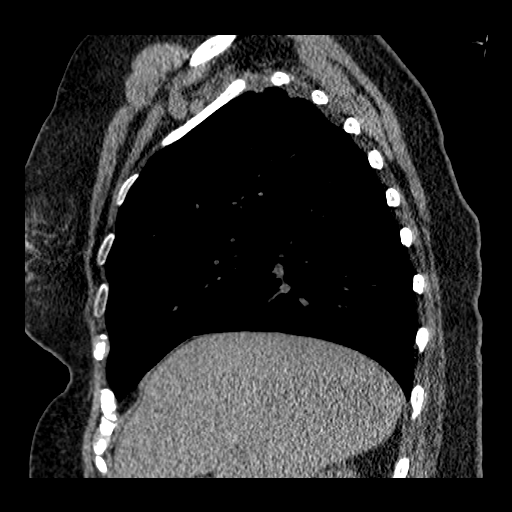
[im 62/137  mediastinal]
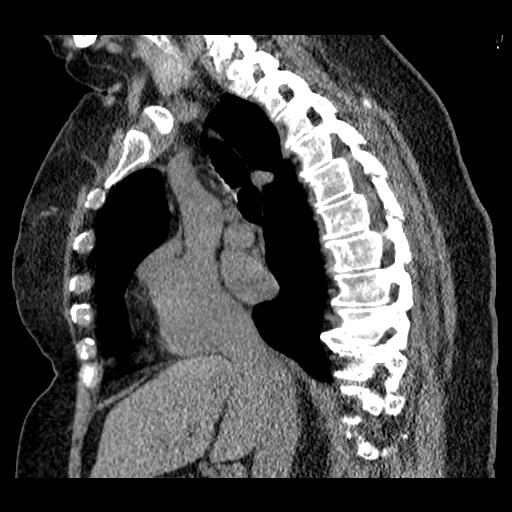
[im 75/137  mediastinal]
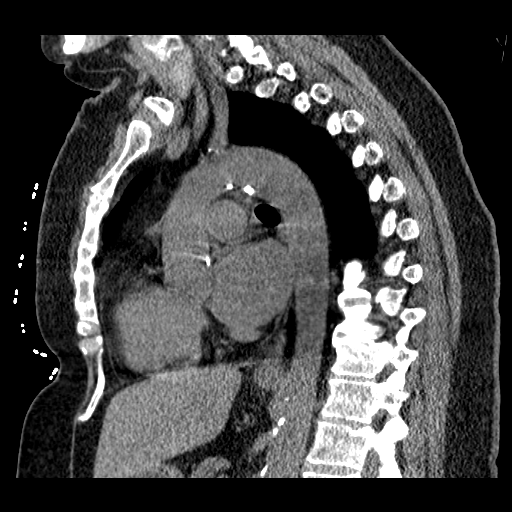
[im 96/137  mediastinal]
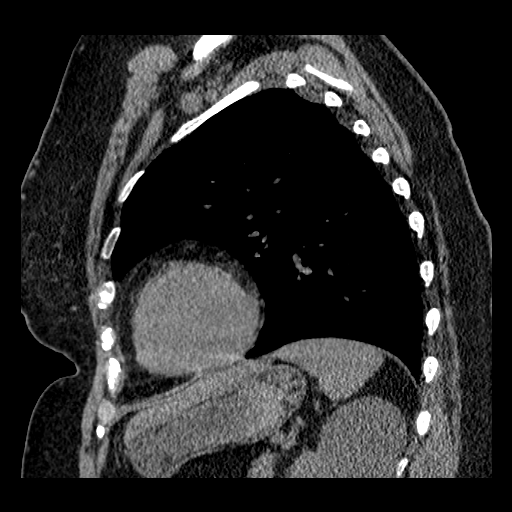
[im 109/137  mediastinal]
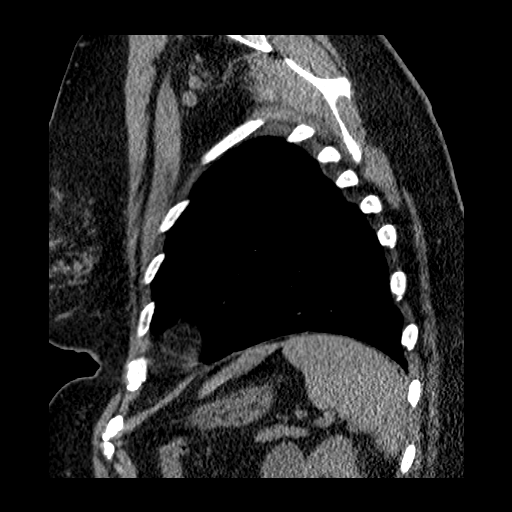
[im 123/137  mediastinal]
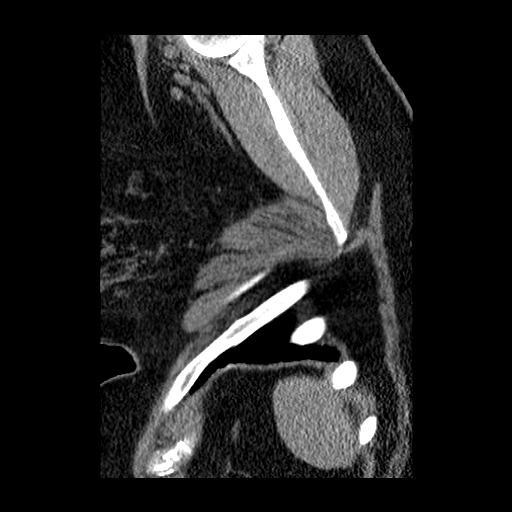

[13 of 30 positions shown; findings below may reference images not displayed]

FINDINGS: No pathologically enlarged mediastinal or axillary lymph
nodes.  Hilar regions are difficult to definitively evaluate
without IV contrast.  Pulmonary arteries are borderline enlarged.
Coronary artery calcification.  Heart size normal.  No pericardial
effusion.

Mild biapical pleural parenchymal scarring.  There are scattered
ground-glass nodules bilaterally, most of which measure 10 mm or
less in size, and are stable from 08/20/2010.  The largest nodule
is seen in the superior segment right lower lobe, measuring 17 x 10
mm, also stable.  No pleural fluid.  Airway is unremarkable.

Incidental imaging of the upper abdomen shows no acute findings.
No worrisome lytic or sclerotic lesions.
IMPRESSION: Stable scattered ground-glass nodules, one of which measures
greater than 1 cm in the superior segment right lower lobe.
Because of the size and persistence of the largest lesion, thoracic
surgery consultation is recommended. This recommendation is taken
from the following article:  Subsolid Pulmonary Nodules and the
Spectrum of Peripheral Adenocarcinomas of the Lung:  Recommended
Interim Guidelines for Assessment and Management.  Radiology
November 2008; [DATE].

## 2012-07-09 IMAGING — CR DG CHEST 2V
2 series · 2 of 2 positions shown · non-contrast
Comparison: CT dated 11/30/2010

CLINICAL DATA: Lung nodule.  Preadmission evaluation.

CHEST - 2 VIEW

[view not recorded (1 of 2)]
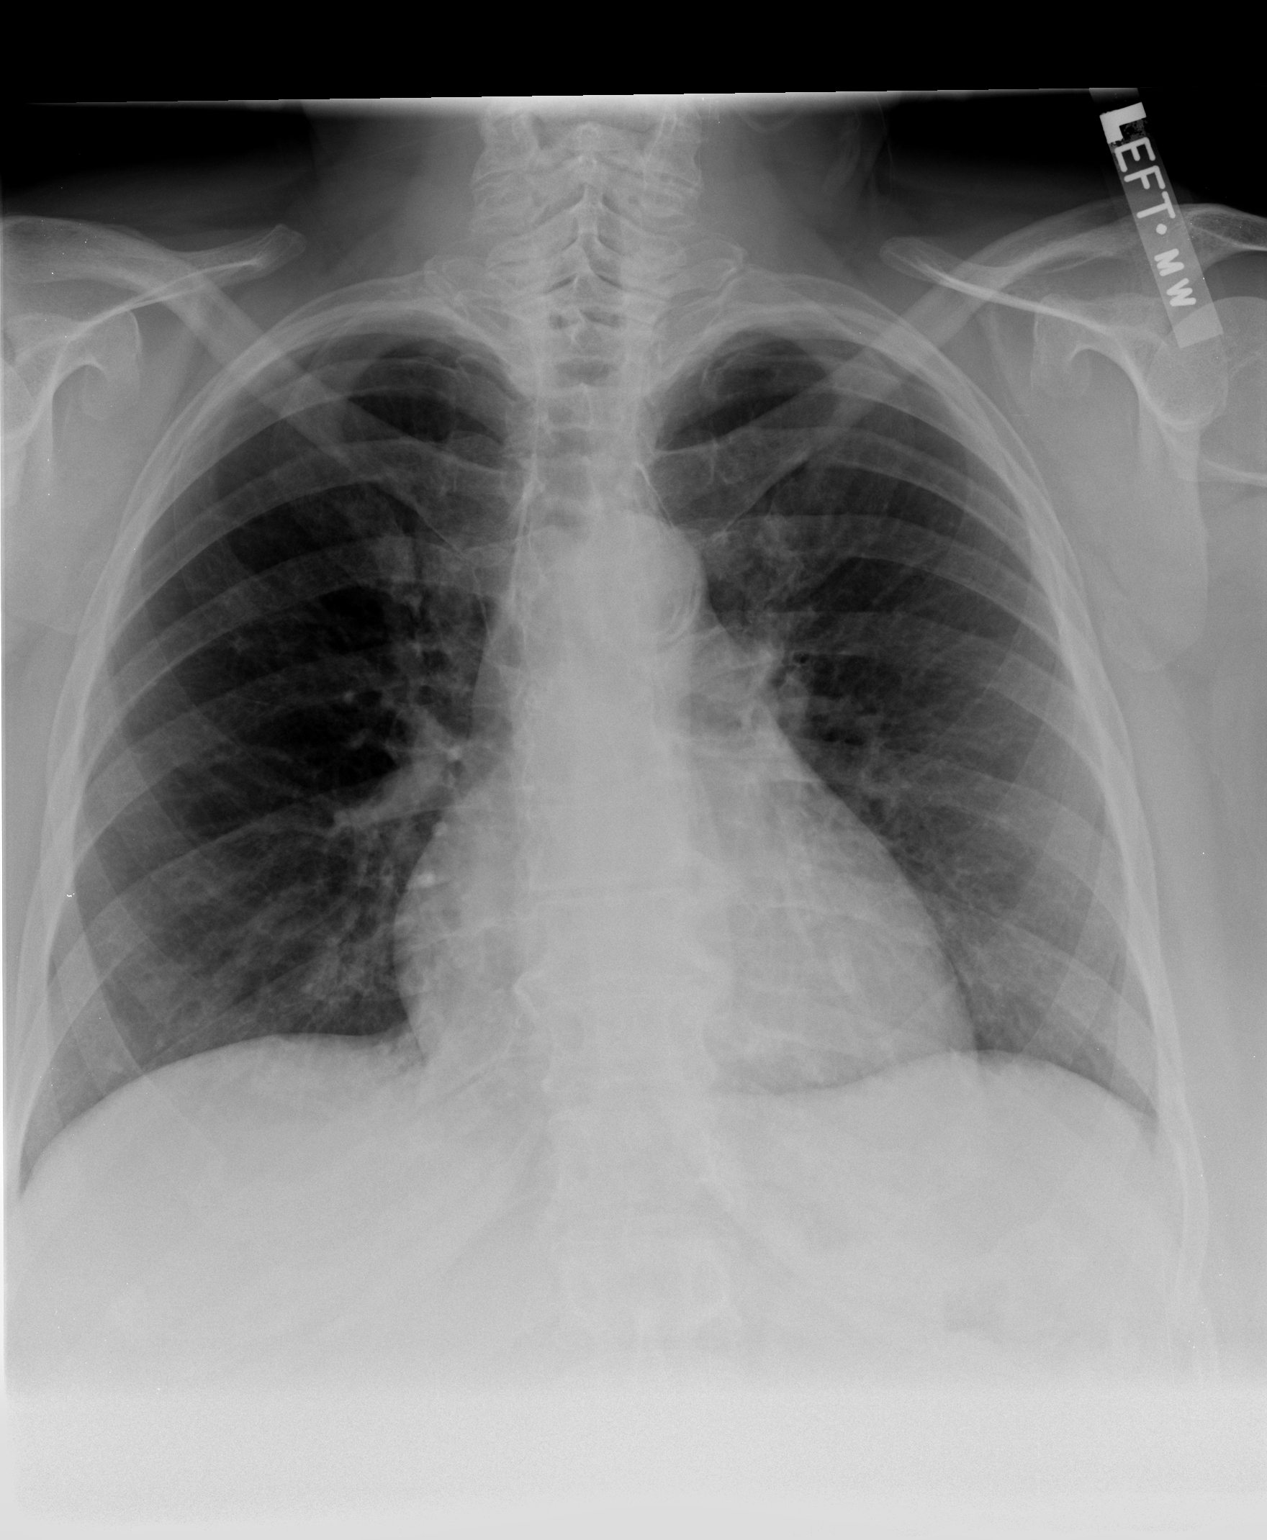

[view not recorded (2 of 2)]
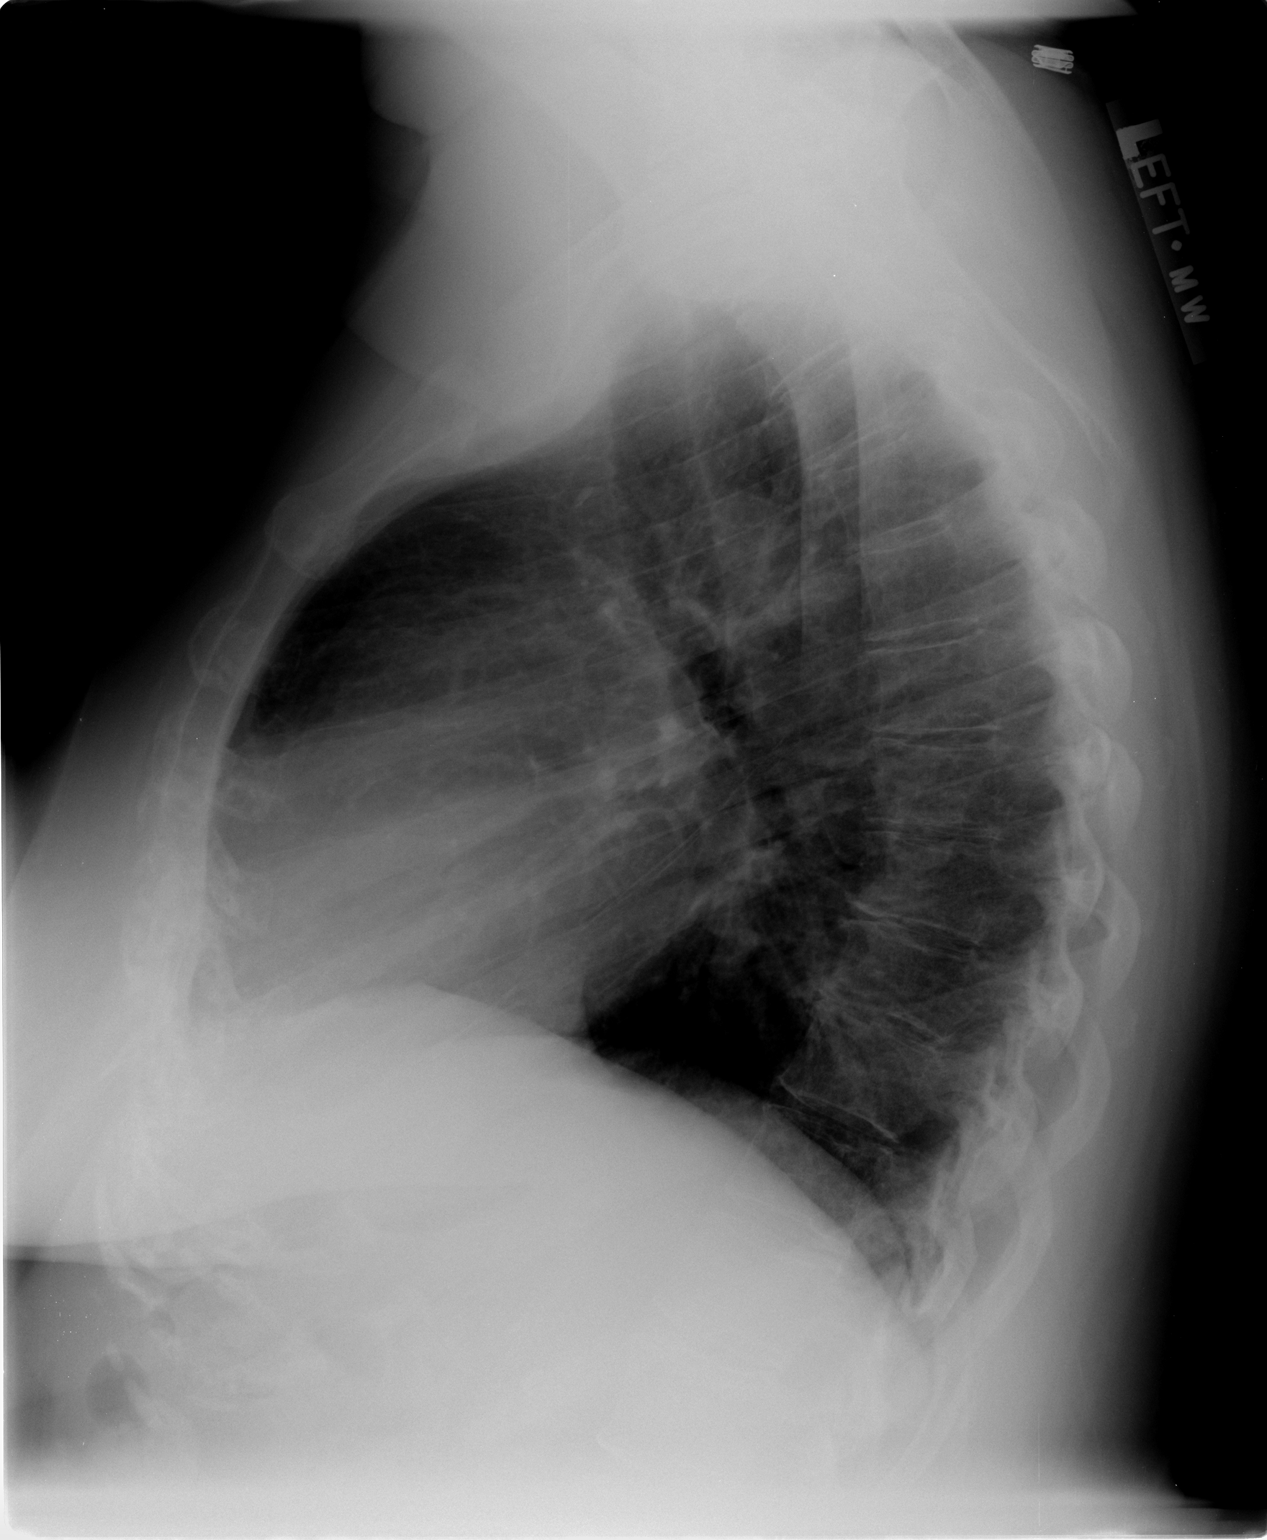

[2 of 2 positions shown; findings below may reference images not displayed]

FINDINGS: The previously described ground-glass attenuation
nodules in the right lung are not well seen on today's exam.  There
is consolidation, edema or effusion are seen.  The heart is normal
in size.  The upper abdomen and osseous structures are
unremarkable.
IMPRESSION: Ground-glass nodules in the right lung better visualized on prior
chest CT.  No acute findings on today's exam.

## 2012-07-12 IMAGING — CR DG CHEST 1V PORT
1 series · 1 of 1 positions shown · non-contrast
Comparison: 12/22/2010 and earlier.

CLINICAL DATA: 69-year-old female status post surgery for lung
nodule.

PORTABLE CHEST - 1 VIEW

[view not recorded]
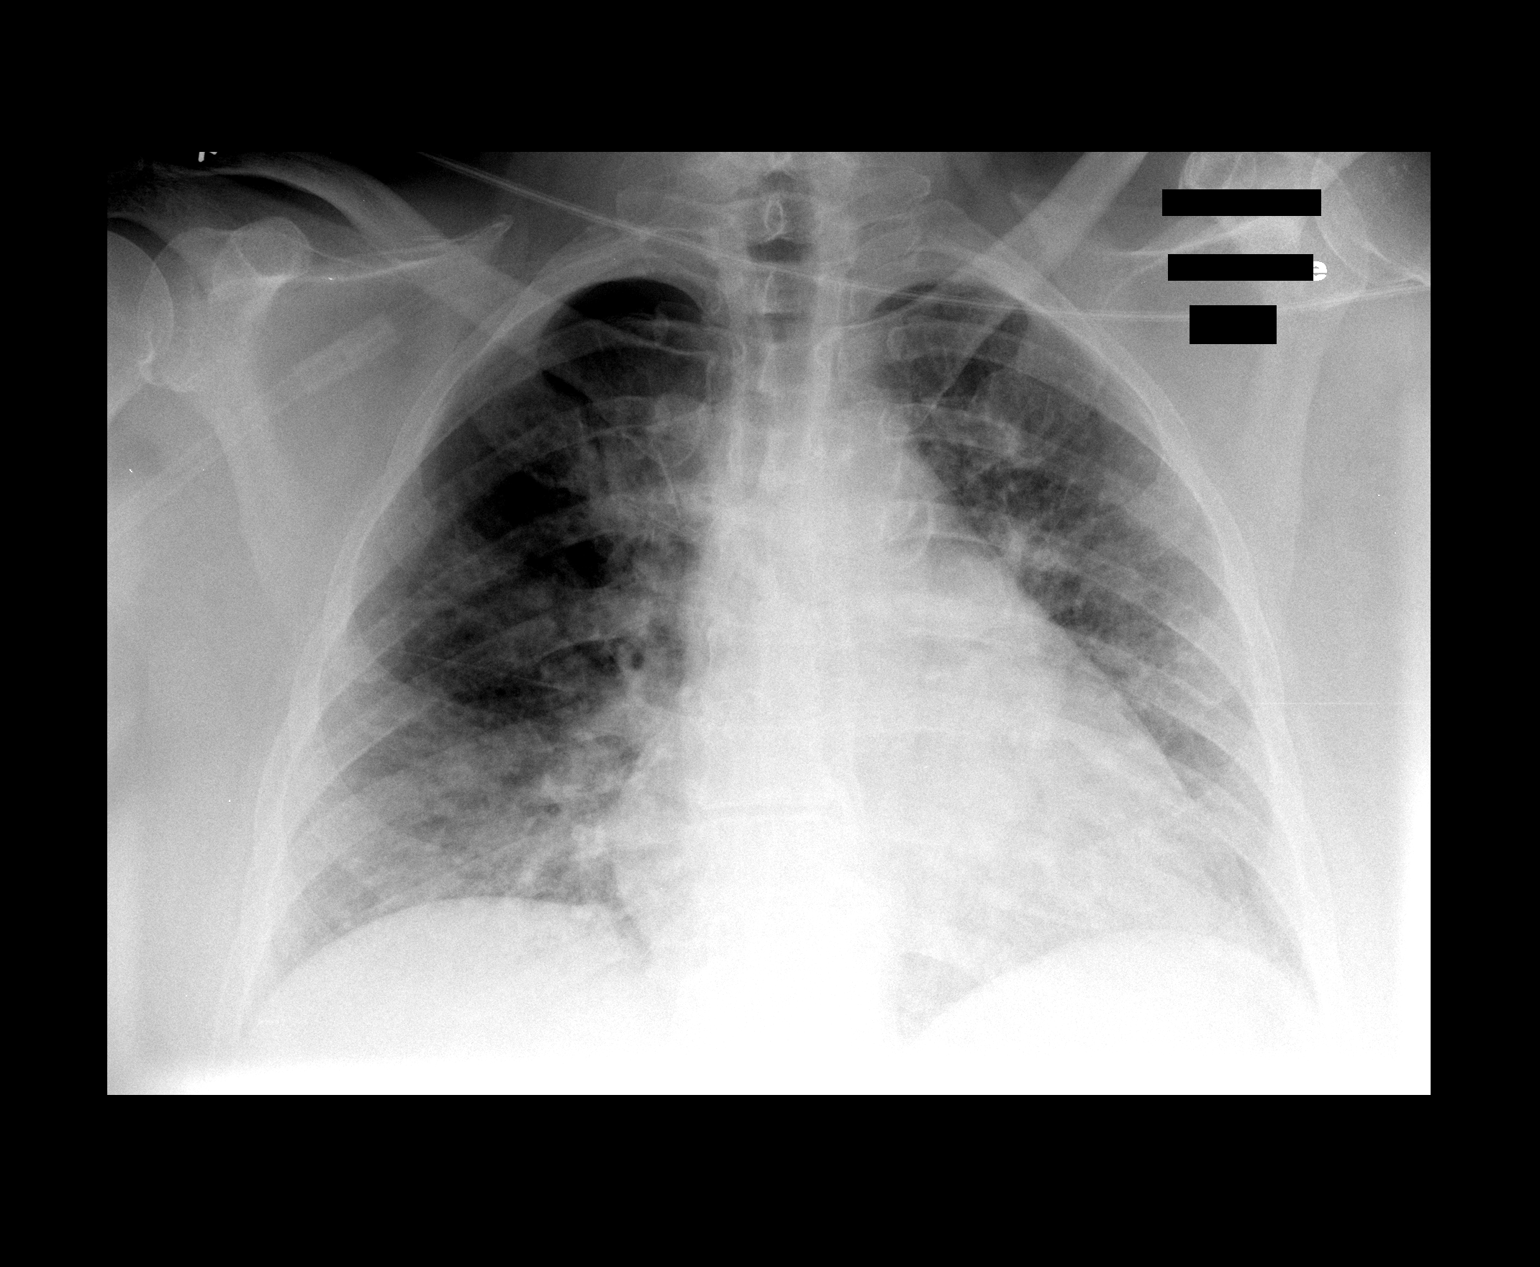

[1 of 1 positions shown; findings below may reference images not displayed]

FINDINGS: Seated AP portable view the chest at 2672 hours.  Lower
lung volumes.  Stable cardiac size and mediastinal contours.
Increased interstitial and indistinct perihilar opacity, greater on
the left.  No pneumothorax or pleural effusion.  No consolidation.
Visualized tracheal air column is within normal limits.  No support
apparatus identified.
IMPRESSION: Lower lung volumes.  Indistinct perihilar interstitial opacity may
reflect atelectasis.  No other acute findings.

## 2012-09-16 IMAGING — CT CT CHEST W/O CM
2 of 3 series · 15 of 36 positions shown, 18 images · non-contrast
Comparison: CT 11/30/2010, 08/20/2010

CLINICAL DATA: Follow-up pulmonary nodule

CT CHEST WITHOUT CONTRAST
TECHNIQUE: Multidetector CT imaging of the chest was performed
following the standard protocol without IV contrast.

[Series 2: chest routine with · axial · 0.75mm/px · z∈[-202,+38]mm · 12 of 58 slices shown, 15 images]
[im 5/58  mediastinal]
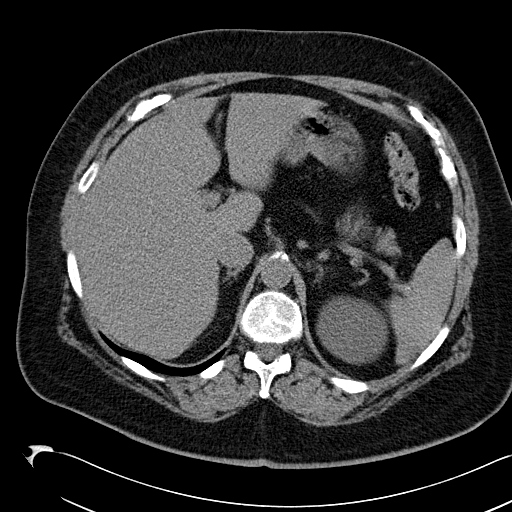
[im 5/58  lung]
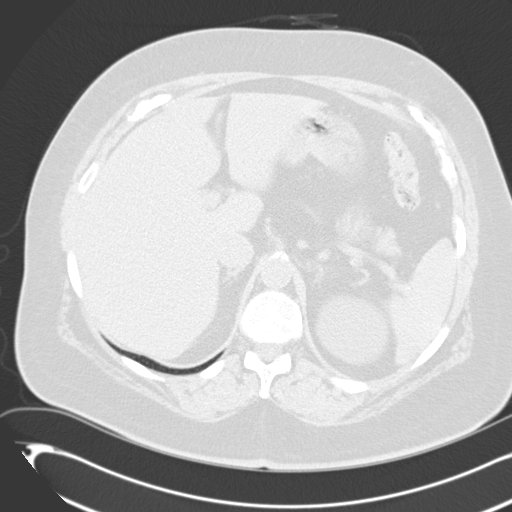
[im 9/58  lung]
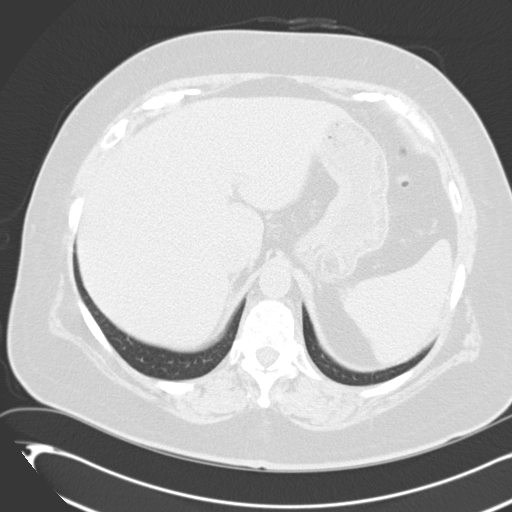
[im 13/58  lung]
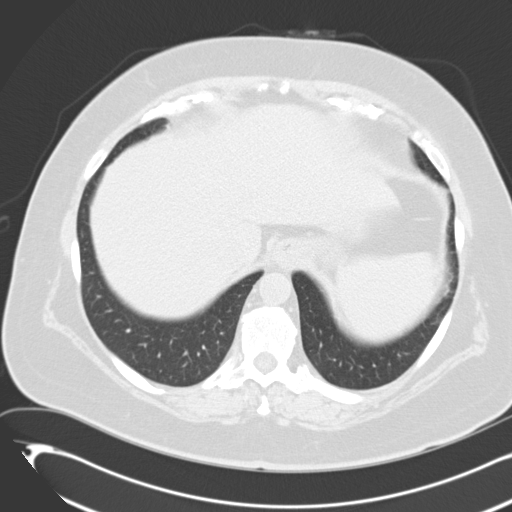
[im 17/58  lung]
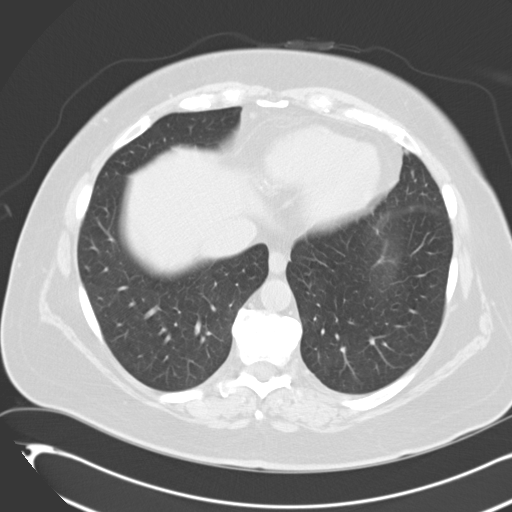
[im 22/58  mediastinal]
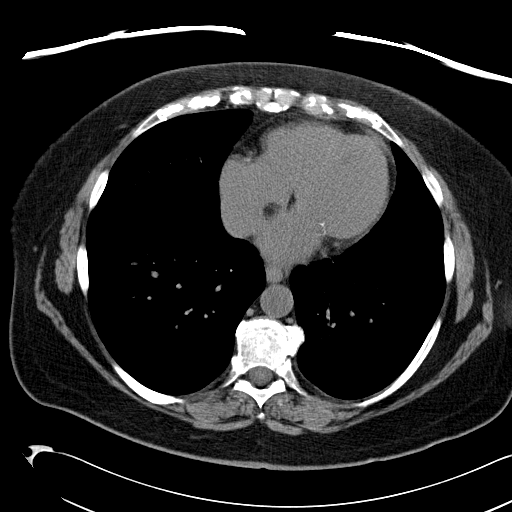
[im 22/58  lung]
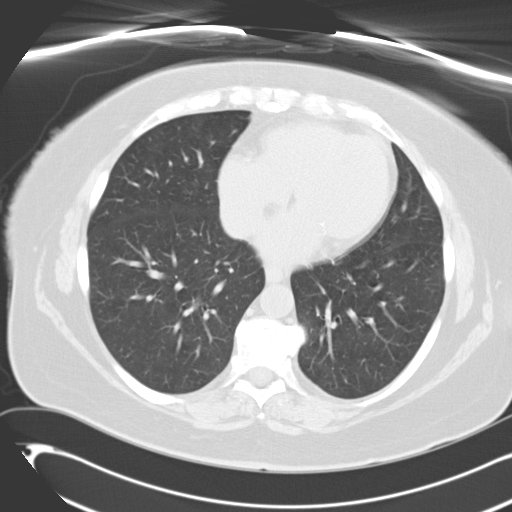
[im 26/58  lung]
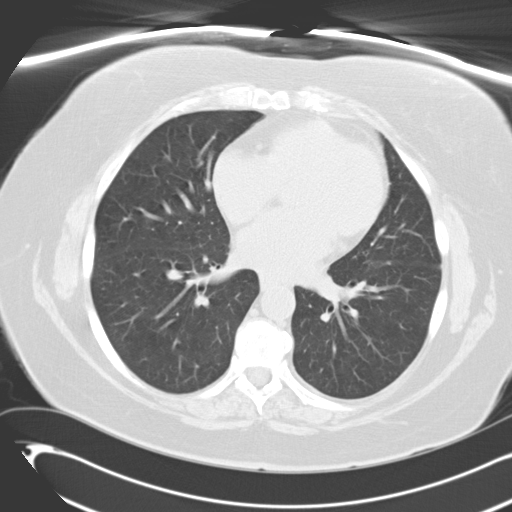
[im 32/58  lung]
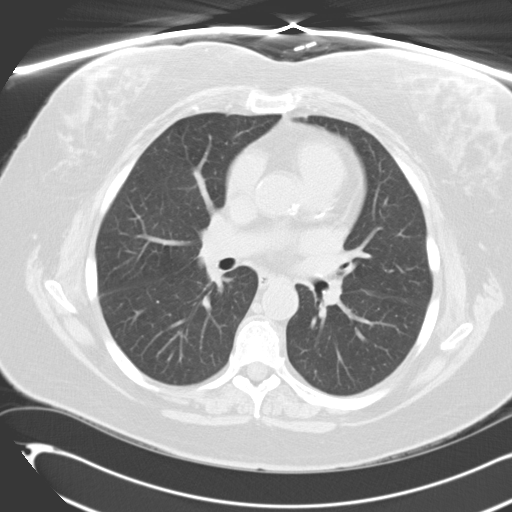
[im 36/58  lung]
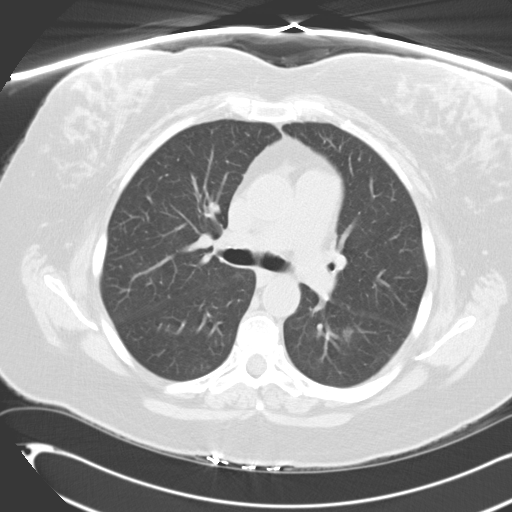
[im 41/58  mediastinal]
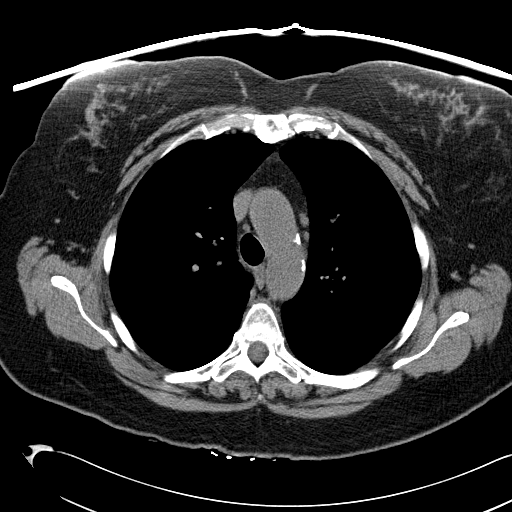
[im 41/58  lung]
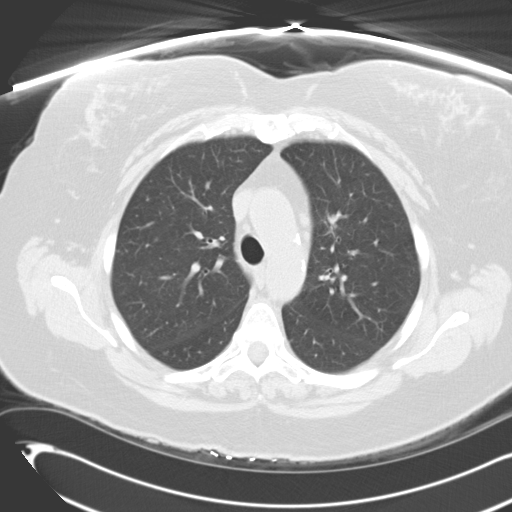
[im 45/58  lung]
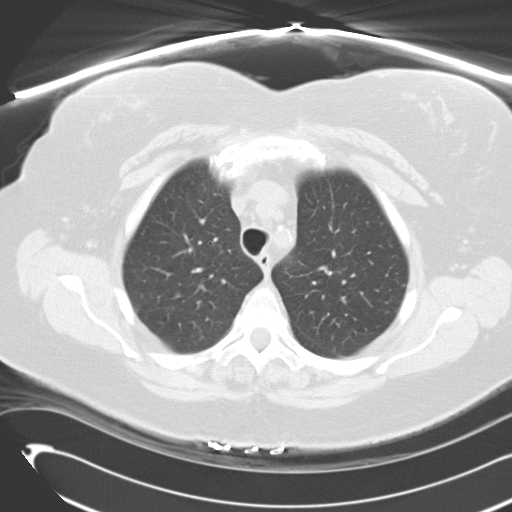
[im 49/58  lung]
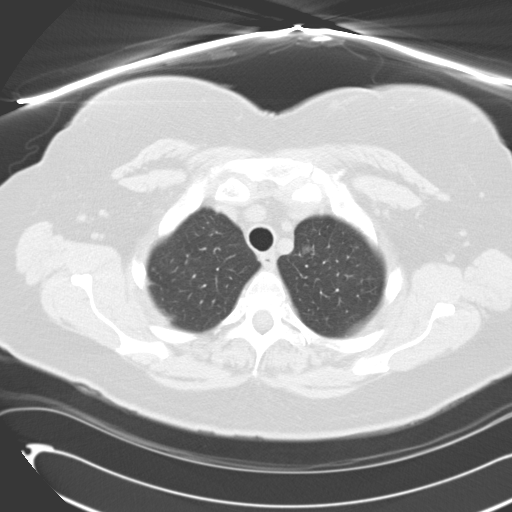
[im 53/58  lung]
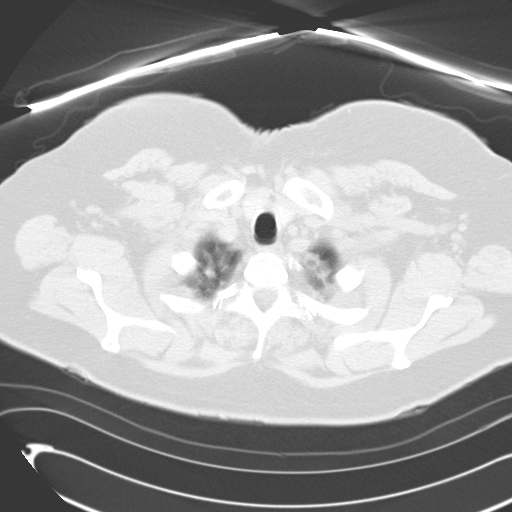

[Series 602: <mpr thick range> · coronal · 0.75mm/px · 3 of 135 slices shown]
[im 27/135  lung]
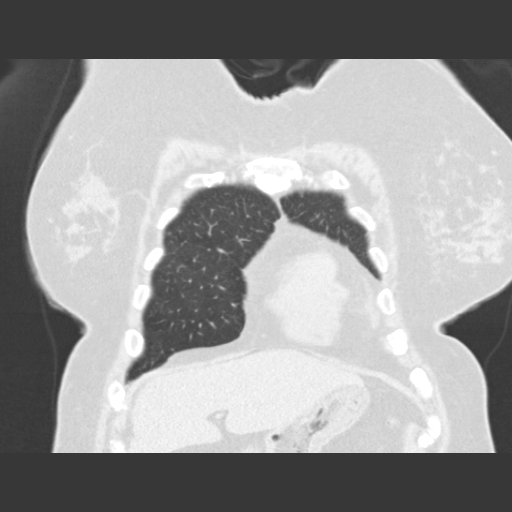
[im 54/135  lung]
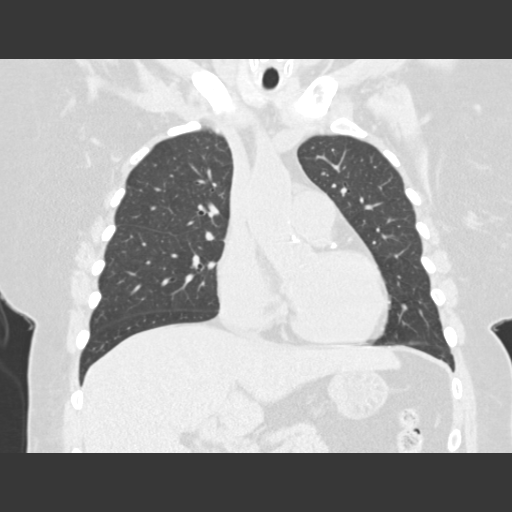
[im 81/135  lung]
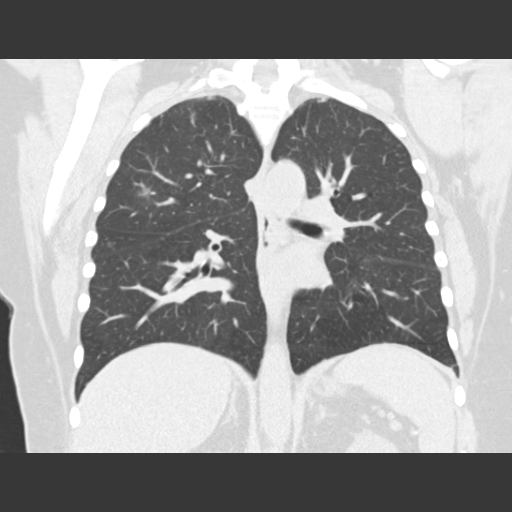

[15 of 36 positions shown; findings below may reference images not displayed]

FINDINGS: There are three ground-glass nodules unchanged from size
compared to 08/20/2010.  12 mm nodule in the right upper lobe
(image 20), 16 mm nodule in the superior segment of the right lower
lobe (image 21), and 8 mm nodule in the superior segment of the
left lower lobe (image 23).

Subtle 5 mm nodule in the right middle lobe (image 31) is also
likely present on prior although less well defined.

No evidence of axillary or supraclavicular lymphadenopathy.  No
mediastinal or hilar lymphadenopathy.  Coronary calcifications are
noted.

There is a gallstone in the gallbladder.  Left renal  cyst noted.

MPRESSION:

Stable ground-glass nodules compared to 08/20/2010.  Differential
includes chronic infectious inflammatory process versus low grade
adenocarcinomas.  Recommend continued CT follow-up versus tissue
sampling.

## 2012-10-24 ENCOUNTER — Other Ambulatory Visit (HOSPITAL_BASED_OUTPATIENT_CLINIC_OR_DEPARTMENT_OTHER): Payer: Medicare HMO | Admitting: Lab

## 2012-10-24 ENCOUNTER — Ambulatory Visit (HOSPITAL_COMMUNITY)
Admission: RE | Admit: 2012-10-24 | Discharge: 2012-10-24 | Disposition: A | Discharge status: Home / Self Care | Payer: Medicare HMO | Source: Ambulatory Visit | Attending: Internal Medicine | Admitting: Internal Medicine

## 2012-10-24 DIAGNOSIS — C3490 Malignant neoplasm of unspecified part of unspecified bronchus or lung: Secondary | ICD-10-CM | POA: Insufficient documentation

## 2012-10-24 DIAGNOSIS — R05 Cough: Secondary | ICD-10-CM | POA: Insufficient documentation

## 2012-10-24 DIAGNOSIS — C801 Malignant (primary) neoplasm, unspecified: Secondary | ICD-10-CM

## 2012-10-24 DIAGNOSIS — R0602 Shortness of breath: Secondary | ICD-10-CM | POA: Insufficient documentation

## 2012-10-24 DIAGNOSIS — R918 Other nonspecific abnormal finding of lung field: Secondary | ICD-10-CM | POA: Insufficient documentation

## 2012-10-24 DIAGNOSIS — R059 Cough, unspecified: Secondary | ICD-10-CM | POA: Insufficient documentation

## 2012-10-24 DIAGNOSIS — I700 Atherosclerosis of aorta: Secondary | ICD-10-CM | POA: Insufficient documentation

## 2012-10-24 DIAGNOSIS — Z902 Acquired absence of lung [part of]: Secondary | ICD-10-CM | POA: Insufficient documentation

## 2012-10-24 LAB — COMPREHENSIVE METABOLIC PANEL (CC13)
ALT: 16 U/L (ref 0–55)
CO2: 28 mEq/L (ref 22–29)
Calcium: 9.6 mg/dL (ref 8.4–10.4)
Chloride: 105 mEq/L (ref 98–107)
Glucose: 104 mg/dl — ABNORMAL HIGH (ref 70–99)
Sodium: 142 mEq/L (ref 136–145)
Total Bilirubin: 0.42 mg/dL (ref 0.20–1.20)
Total Protein: 6.6 g/dL (ref 6.4–8.3)

## 2012-10-24 LAB — CBC WITH DIFFERENTIAL/PLATELET
BASO%: 0.5 % (ref 0.0–2.0)
Eosinophils Absolute: 0.2 10*3/uL (ref 0.0–0.5)
MCHC: 34.1 g/dL (ref 31.5–36.0)
MONO#: 0.6 10*3/uL (ref 0.1–0.9)
NEUT#: 3.3 10*3/uL (ref 1.5–6.5)
Platelets: 195 10*3/uL (ref 145–400)
RBC: 5.11 10*6/uL (ref 3.70–5.45)
RDW: 14.6 % — ABNORMAL HIGH (ref 11.2–14.5)
WBC: 5.9 10*3/uL (ref 3.9–10.3)
lymph#: 1.8 10*3/uL (ref 0.9–3.3)

## 2012-10-26 ENCOUNTER — Telehealth: Payer: Self-pay | Admitting: Internal Medicine

## 2012-10-26 ENCOUNTER — Ambulatory Visit (HOSPITAL_BASED_OUTPATIENT_CLINIC_OR_DEPARTMENT_OTHER): Payer: Medicare HMO | Admitting: Internal Medicine

## 2012-10-26 VITALS — BP 142/78 | HR 75 | Temp 98.4°F | Resp 20 | Ht 71.0 in | Wt 279.2 lb

## 2012-10-26 DIAGNOSIS — C3490 Malignant neoplasm of unspecified part of unspecified bronchus or lung: Secondary | ICD-10-CM

## 2012-10-26 DIAGNOSIS — C3410 Malignant neoplasm of upper lobe, unspecified bronchus or lung: Secondary | ICD-10-CM

## 2012-10-26 NOTE — Progress Notes (Signed)
[facility] [phone]   Fax:([phone]  OFFICE PROGRESS NOTE  [name], OTR [address] - [place] [address]  DIAGNOSIS: Stage IA (T1a., N0, M0) non-small cell lung cancer consistent with adenocarcinoma with negative EGFR mutation and negative ALK gene translocation diagnosed in September of 2012. The patient also has bilateral groundglass opacities suspicious for low-grade adenocarcinoma.   PRIOR THERAPY: Status post left VATS with wedge resection of the left upper lobe lesion and node sampling under the care of Dr. Edwyna Shell on 10/01/2011   CURRENT THERAPY: Observation.  INTERVAL HISTORY: Tammie [name] 71 y.o. female returns to the clinic today for routine followup visit. The patient is feeling fine today with no specific complaints. She denied having any significant chest pain, shortness breath, cough or hemoptysis. She denied having any significant weight loss or night sweats. She denied having any recent bronchitis or pneumonia. The patient has repeat CT scan of the chest performed recently and she is here for evaluation and discussion of her scan results.  MEDICAL HISTORY: Past Medical History  Diagnosis Date  . Asthma   . Fibrocystic disease of breast   . Mini stroke     x2. Dr. Jefm Miles  / Dr. Lenora Boys   . Hematuria     Dr. Porfirio Mylar    . Renal calculi   . Hyperlipidemia   . Obesity   . [place]ath-induced atrial fibrillation     on coumadin   . Hypertension   . Arthritis   . Seasonal allergies   . Nephrolithiasis   . Cystic disease of breast   . Cancer 10/01/11    ADENOCARCINOMA  LUNG    ALLERGIES:  is allergic to iohexol; sulfa antibiotics; and sulfamethoxazole w-trimethoprim.  MEDICATIONS:  Current Outpatient Prescriptions  Medication Sig Dispense Refill  . acetaminophen (TYLENOL) 500 MG tablet Take 1,000 mg by mouth every 6 (six) hours as needed. For pain.      . ALBUTEROL IN Inhale 90 mcg into the lungs  as needed.       . calcium carbonate (OS-CAL - DOSED IN MG OF ELEMENTAL CALCIUM) 1250 MG tablet Take 1 tablet by mouth daily. take 1/2 tablet      . cholecalciferol (VITAMIN D) 1000 UNITS tablet Take 1,000 Units by mouth daily.       . fish oil-omega-3 fatty acids 1000 MG capsule Take 1 g by mouth daily.       . Flaxseed, Linseed, (FLAX SEED OIL PO) Take 1 capsule by mouth daily.       . Garlic 1000 MG CAPS Take 1 capsule by mouth 2 (two) times daily.       . hydrochlorothiazide (MICROZIDE) 12.5 MG capsule Take 2 capsules (25 mg total) by mouth daily.  30 capsule  0  . metoprolol (LOPRESSOR) 100 MG tablet Take 100 mg by mouth 2 (two) times daily.      . Multiple Vitamin (MULTIVITAMIN) tablet Take 1 tablet by mouth daily.       . simvastatin (ZOCOR) 10 MG tablet Take 10 mg by mouth at bedtime.      Marland Kitchen warfarin (COUMADIN) 5 MG tablet Take 2.5-5 mg by mouth See admin instructions. 0.5 tab daily except 1 tab daily on Tuesdays and Saturdays.      Marland Kitchen DISCONTD: Calcium Citrate-Vitamin D (CALCIUM CITRATE + D) 300-100 MG-UNIT TABS Take 0.5 tablets by mouth 2 (two) times daily.        Marland Kitchen  DISCONTD: metoprolol (TOPROL-XL) 100 MG 24 hr tablet Take 100 mg by mouth daily.          SURGICAL HISTORY:  Past Surgical History  Procedure Date  . Tonsillectomy 50  . Kidney stones [age]  . Cyst of  left breast and right breast     Dr. Irena Reichmann   . Abdominal hysterectomy 1990    Dr. Colon Branch   . Dilation and curettage of uterus   . Lung cancer surgery 10/01/11  DR.BURNEY    (L)VATS,ANT. MINI THORACOTOMY, WEDGE RESECTION OF LULOBE LESION WITH NODWE SAMPLING    REVIEW OF SYSTEMS:  A comprehensive review of systems was negative.   PHYSICAL EXAMINATION: General appearance: alert, cooperative and no distress Head: Normocephalic, without obvious abnormality, atraumatic Neck: no adenopathy Lymph nodes: Cervical, supraclavicular, and axillary nodes normal. Resp: clear to auscultation bilaterally Cardio: regular rate  and rhythm, S1, S2 normal, no murmur, click, rub or gallop GI: soft, non-tender; bowel sounds normal; no masses,  no organomegaly Extremities: extremities normal, atraumatic, no cyanosis or edema  ECOG PERFORMANCE STATUS: 0 - Asymptomatic  Blood pressure 142/78, pulse 75, temperature 98.4 F (36.9 C), temperature source Oral, resp. rate 20, height 5\' 11"  (1.803 m), weight 279 lb 3.2 oz (126.644 kg).  LABORATORY DATA: Lab Results  Component Value Date   WBC 5.9 10/24/2012   HGB 14.6 10/24/2012   HCT 42.8 10/24/2012   MCV 83.8 10/24/2012   PLT 195 10/24/2012      Chemistry      Component Value Date/Time   NA 142 10/24/2012 0903   NA 140 03/13/2012 0405   K 4.1 10/24/2012 0903   K 3.2* 03/13/2012 0405   CL 105 10/24/2012 0903   CL 97 03/13/2012 0405   CO2 28 10/24/2012 0903   CO2 35* 03/13/2012 0405   BUN 19.0 10/24/2012 0903   BUN 27* 03/13/2012 0405   CREATININE 0.8 10/24/2012 0903   CREATININE 0.64 03/13/2012 0405      Component Value Date/Time   CALCIUM 9.6 10/24/2012 0903   CALCIUM 8.6 03/13/2012 0405   ALKPHOS 75 10/24/2012 0903   ALKPHOS 54 03/09/2012 0635   AST 19 10/24/2012 0903   AST 23 03/09/2012 0635   ALT 16 10/24/2012 0903   ALT 14 03/09/2012 0635   BILITOT 0.42 10/24/2012 0903   BILITOT 0.2* 03/09/2012 0635       RADIOGRAPHIC STUDIES: Ct Chest Wo Contrast  10/24/2012  *RADIOLOGY REPORT*  Clinical Data: Lung cancer diagnosed 2011, status post left upper lobectomy, cough, shortness of breath  CT CHEST WITHOUT CONTRAST  Technique:  Multidetector CT imaging of the chest was performed following the standard protocol without IV contrast.  Comparison: 03/02/2012  Findings: Prior left upper lobe wedge resection.  9 x 7 mm ground-glass nodule in the left lower lobe (series 7/image 23), grossly unchanged versus minimally increased.  16 x 11 mm ground-glass nodule in the superior segment right lower lobe (series 7/image 21), unchanged.  Subsolid ground-glass nodule with 7  mm solid component in the posterior right upper lobe (series 7/image 20), grossly unchanged.  Additional mild nodularity in the right lung.  Prior bilateral lower lobe tree-in-bud ground-glass nodularity has resolved and was likely infectious/inflammatory.  Visualized thyroid is unremarkable.  The heart is normal in size.  No pericardial effusion.  Coronary atherosclerosis.  Atherosclerotic calcifications of the aortic arch.  No suspicious mediastinal or axillary lymphadenopathy.  Visualized upper abdomen is notable for cholelithiasis and a large left renal cyst, incompletely  visualized.  Mild degenerative changes of the visualized thoracolumbar spine.  IMPRESSION: Status post left upper lobe wedge resection.  Ground-glass / subsolid pulmonary nodules in the right upper and bilateral lower lobes, as described above, grossly unchanged.  This appearance remains suspicious for multifocal low grade adenocarcinoma.  Prior bilateral lower lobe tree-in-bud ground-glass nodularity has resolved, likely infectious/inflammatory.   Original Report Authenticated By: Charline Bills, M.D.     ASSESSMENT: This is a very pleasant 71 years old white female with a stage IA non-small cell lung cancer, adenocarcinoma with negative EGFR mutation and negative ALK gene translocation status post wedge resection of the left upper lobe with node sampling in October of 2012 and she has been observation since that time was no evidence for disease progression but persistent bilateral groundglass opacities suspicious for low-grade adenocarcinoma.   PLAN:  I discussed the scan results and showed the images to the patient today. I recommended for her to continue on observation for now. I would see her back for followup visit in 6 months with repeat CT scan of the chest. She was advised to call me immediately if she has any concerning symptoms in the interval. All questions were answered. The patient knows to call the clinic with any  problems, questions or concerns. We can certainly see the patient much sooner if necessary.

## 2012-10-26 NOTE — Telephone Encounter (Signed)
appts made and printed for pt aom °

## 2012-10-26 NOTE — Patient Instructions (Signed)
CT scan of the chest showed stable disease. Followup in 6 months with repeat scan

## 2012-11-16 LAB — CBC AND DIFFERENTIAL
HCT: 43 % (ref 36–46)
Hemoglobin: 14.4 g/dL (ref 12.0–16.0)
WBC: 4.5 10^3/mL

## 2012-12-08 LAB — BASIC METABOLIC PANEL
Glucose: 97 mg/dL
Sodium: 143 mmol/L (ref 137–147)

## 2012-12-08 LAB — LIPID PANEL
Cholesterol: 145 mg/dL (ref 0–200)
HDL: 55 mg/dL (ref 35–70)
LDL Cholesterol: 41 mg/dL
Triglycerides: 247 mg/dL — AB (ref 40–160)

## 2012-12-08 LAB — HEPATIC FUNCTION PANEL: Bilirubin, Direct: 0.1 mg/dL

## 2013-02-14 LAB — POCT INR: INR: 2.1 — AB (ref <=1.1)

## 2013-03-22 ENCOUNTER — Ambulatory Visit (INDEPENDENT_AMBULATORY_CARE_PROVIDER_SITE_OTHER): Payer: MEDICARE | Admitting: Nurse Practitioner

## 2013-03-22 ENCOUNTER — Encounter: Payer: Self-pay | Admitting: Nurse Practitioner

## 2013-03-22 VITALS — BP 142/76 | HR 82 | Temp 97.9°F | Ht 71.0 in | Wt 278.0 lb

## 2013-03-22 DIAGNOSIS — I4891 Unspecified atrial fibrillation: Secondary | ICD-10-CM

## 2013-03-22 NOTE — Progress Notes (Signed)
  Subjective:    Patient ID: Tammie [name], female    DOB: July 03, 1941, 72 y.o.   MRN: [id number]  HPI Patient here for INR Check    Review of Systems     Objective:   Physical Exam BP 142/76  Pulse 82  Temp(Src) 97.9 F (36.6 C) (Oral)  Ht 5\' 11"  (1.803 m)  Wt 278 lb (126.1 kg)  BMI 38.79 kg/m2         Assessment & Plan:  Coumadin recheck. See anticoag notes

## 2013-03-24 IMAGING — CT CT CHEST W/O CM
2 of 4 series · 15 of 36 positions shown, 18 images · IV contrast (Omnipaque 300)
Comparison: 03/01/2011, 11/30/2010 and 08/20/2010.

CLINICAL DATA: Follow-up ground-glass opacities.  Increasing
shortness of breath.

CT CHEST WITHOUT CONTRAST
TECHNIQUE: Multidetector CT imaging of the chest was performed
following the standard protocol without IV contrast.

[Series 2: chest routine with · axial · 0.71mm/px · z∈[-310,-56]mm · 12 of 61 slices shown, 15 images]
[im 5/61  mediastinal]
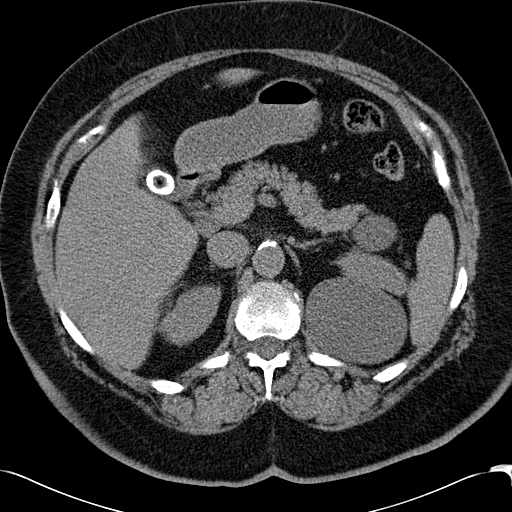
[im 5/61  lung]
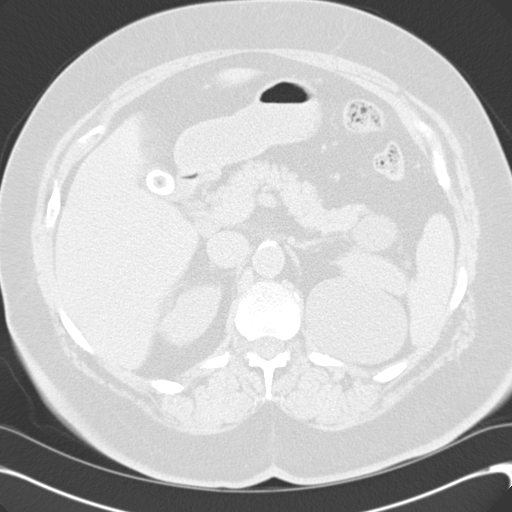
[im 10/61  lung]
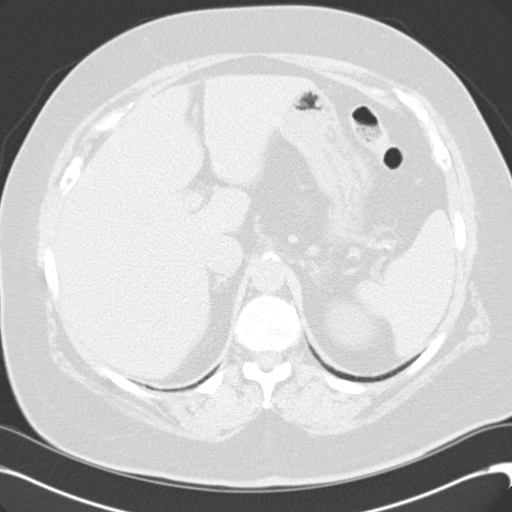
[im 14/61  lung]
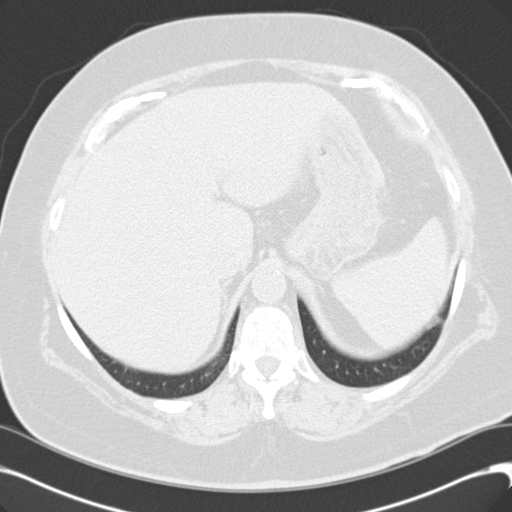
[im 19/61  lung]
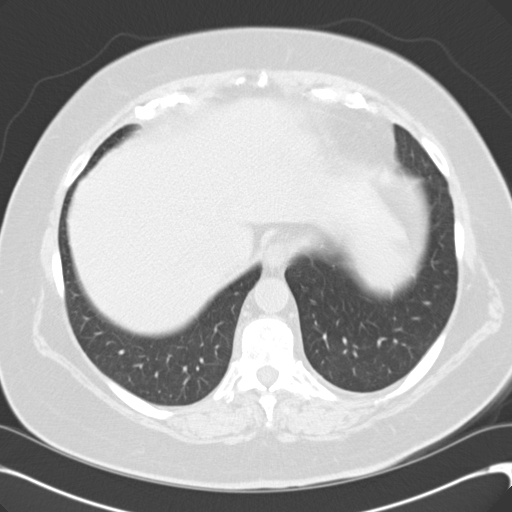
[im 24/61  mediastinal]
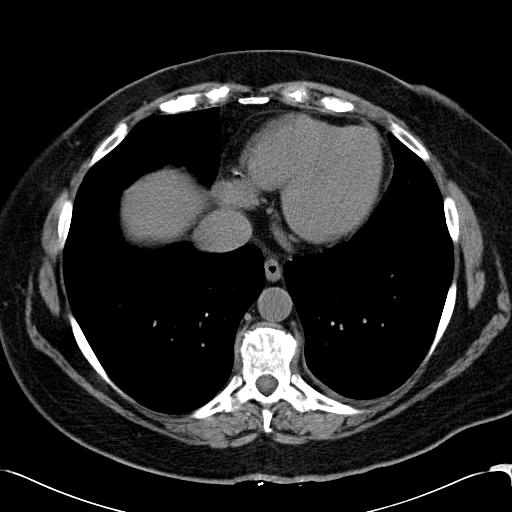
[im 24/61  lung]
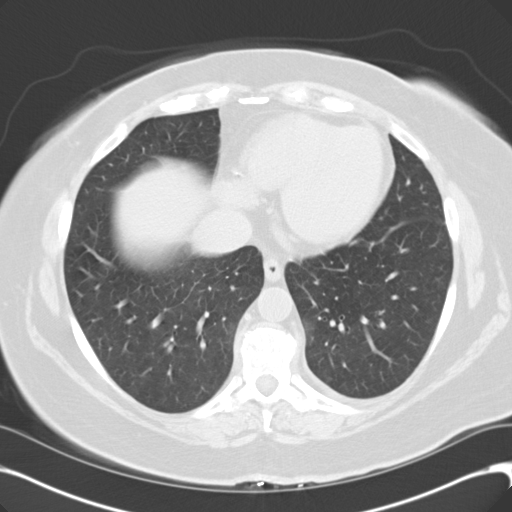
[im 28/61  lung]
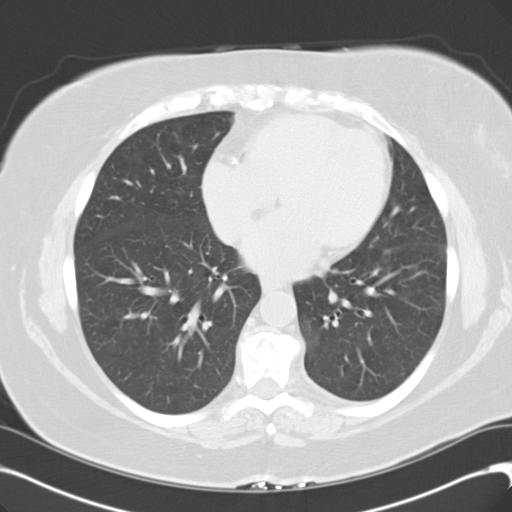
[im 33/61  lung]
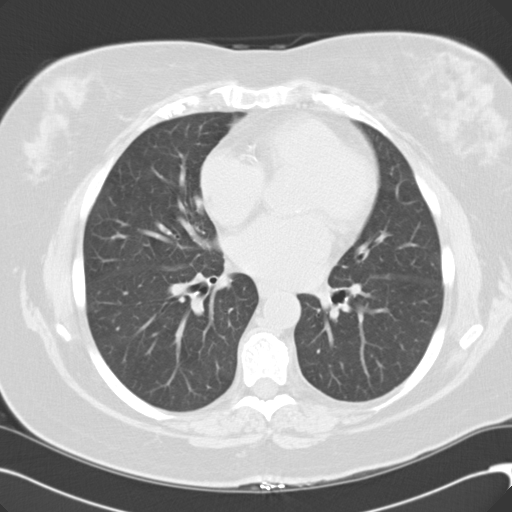
[im 37/61  lung]
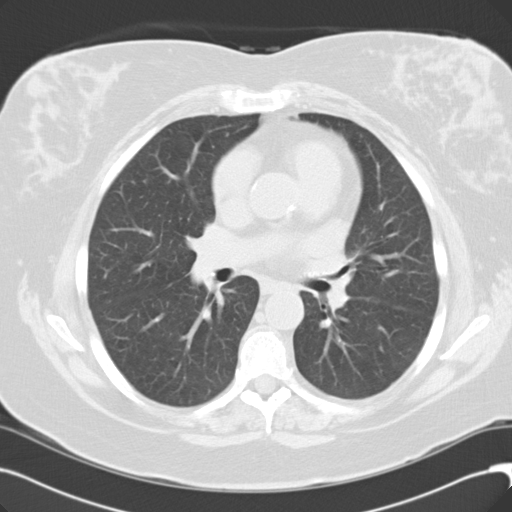
[im 42/61  mediastinal]
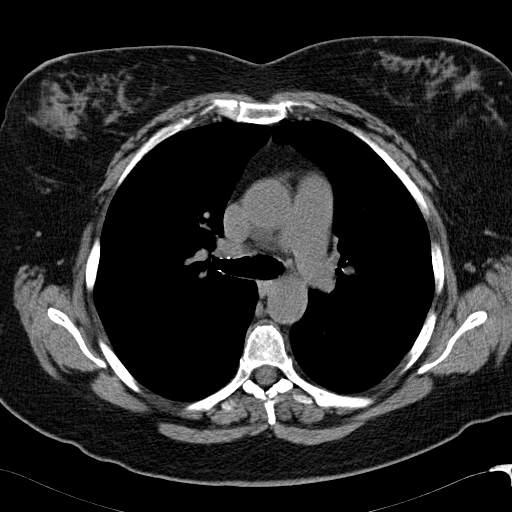
[im 42/61  lung]
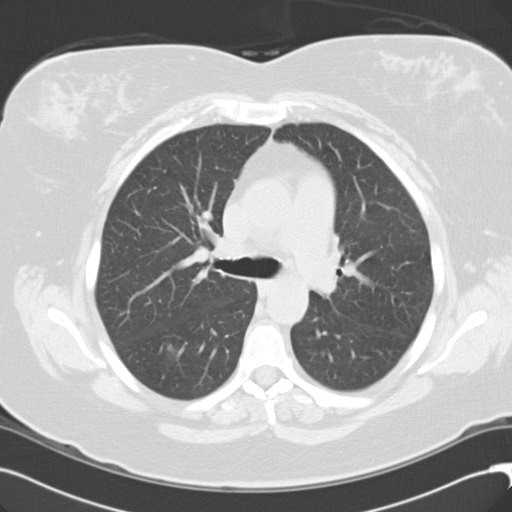
[im 47/61  lung]
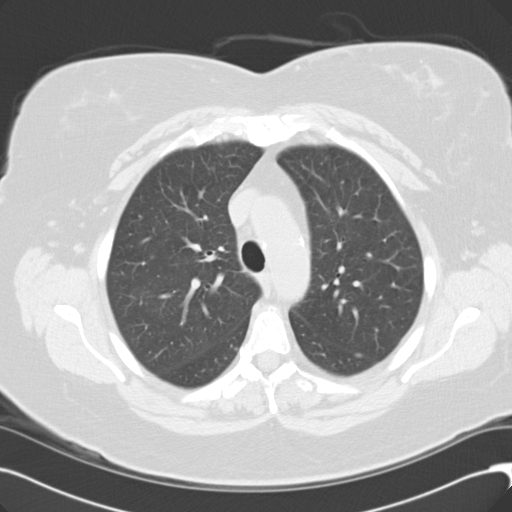
[im 51/61  lung]
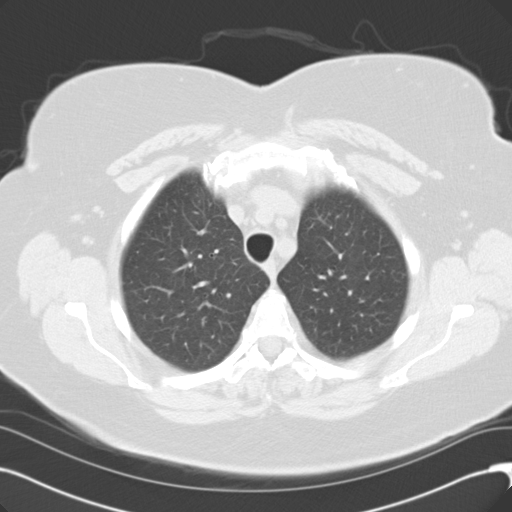
[im 56/61  lung]
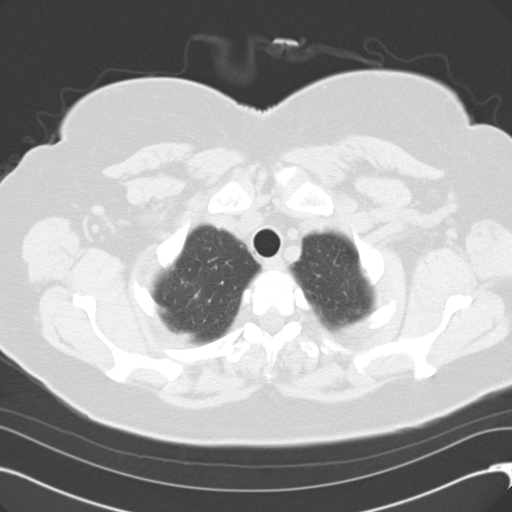

[Series 602: cor · coronal · 0.71mm/px · 3 of 139 slices shown]
[im 28/139  lung]
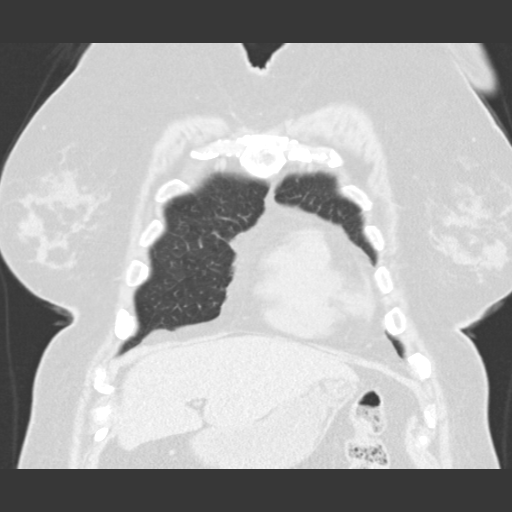
[im 56/139  lung]
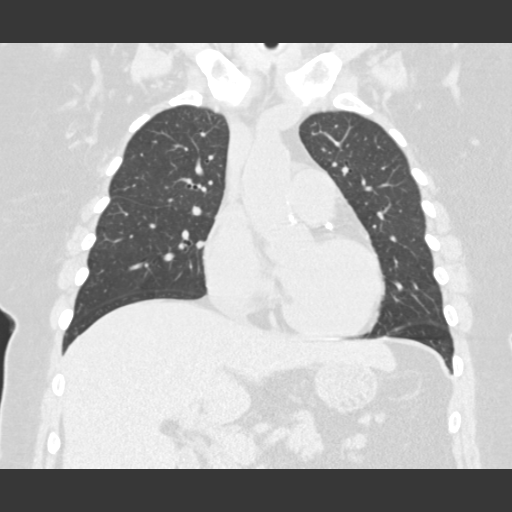
[im 83/139  lung]
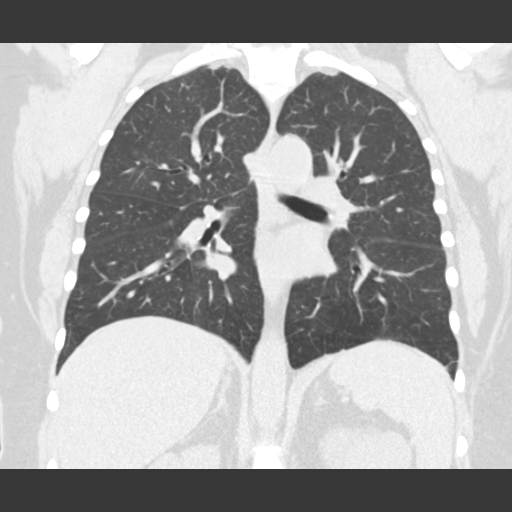

[15 of 36 positions shown; findings below may reference images not displayed]

FINDINGS: No pathologically enlarged mediastinal or axillary lymph
nodes.  Hilar regions are difficult to definitively evaluate
without IV contrast.  Atherosclerotic calcification of the arterial
vasculature, including coronary arteries.  Pulmonary arteries are
enlarged.  Heart size within normal limits.  No pericardial
effusion.

Mild biapical pleural parenchymal scarring.  There are ground-
glass, mixed ground-glass and solid, and solid nodules bilaterally.
The largest solid nodule is seen in the medial left upper lobe,
measuring 1.5 x 0.8 cm (previously 1.2 x 0.5 cm on baseline
examination of 08/20/2010). A mixed solid and ground-glass nodule
in the right upper lobe (image 17) was smaller and uniform ground-
glass in attenuation on 08/20/2010.  Currently, it measures 1.3 x
1.1 cm (image 17), previously 1.3 x 0.8 cm on 08/20/2010.
Additional nodules in the upper and lower lobes show mild increase
in prominence as well.  No pleural fluid.  Airway is unremarkable.

Incidental imaging of the upper abdomen shows a 1.7 x 2.2 cm stone
in the gallbladder.  Small stone is seen in the right kidney.
Exophytic low attenuation lesions off the left kidney measure up to
5.0 x 7.5 cm and are incompletely imaged.  No worrisome lytic or
sclerotic lesions.
IMPRESSION: 1.  Bilateral pulmonary nodules range from ground-glass to solid in
consistency.  Many  have increased in size or developed solid
components when compared with baseline examination of 08/20/2010.
Findings are highly worrisome for multifocal low grade
adenocarcinoma.
2.  Coronary artery calcification.
3.  Pulmonary arterial hypertension.
4.  Cholelithiasis.
5.  Right nephrolithiasis.

## 2013-04-09 IMAGING — PT NM PET TUM IMG INITIAL (PI) SKULL BASE T - THIGH
6 series · 25 of 25 positions shown · non-contrast
Comparison: Chest CTs, most recent 09/06/2011.  Abdominal pelvic CT
04/13/2011.

CLINICAL DATA: Initial treatment strategy for pulmonary nodules.

NUCLEAR MEDICINE PET CT SKULL BASE TO THIGH
TECHNIQUE: 18.1 mCi F-18 FDG was injected intravenously via the
right AC.  Full-ring PET imaging was performed from the skull base
through the mid-thighs 57  minutes after injection.  CT data was
obtained and used for attenuation correction and anatomic
localization only.  (This was not acquired as a diagnostic CT
examination.)
Fasting Blood Glucose:  95

[Series 1: pet ac · axial · 3.3mm · 4.69mm/px · z∈[-888,-18]mm · 5 of 267 slices shown]
[im 1/267]
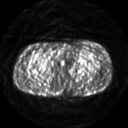
[im 67/267]
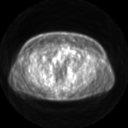
[im 134/267]
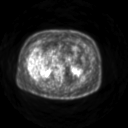
[im 200/267]
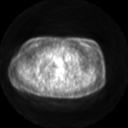
[im 267/267]
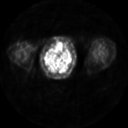

[Series 2: pet nac · axial · 3.3mm · 4.69mm/px · z∈[-888,-18]mm · 6 of 267 slices shown]
[im 1/267]
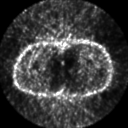
[im 54/267]
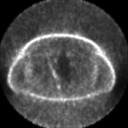
[im 107/267]
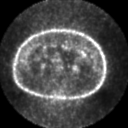
[im 160/267]
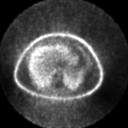
[im 213/267]
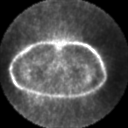
[im 267/267]
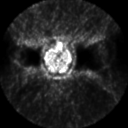

[Series 2: ct images · axial · 3.8mm · 0.98mm/px · z∈[-888,-18]mm · 5 of 265 slices shown]
[im 1/265]
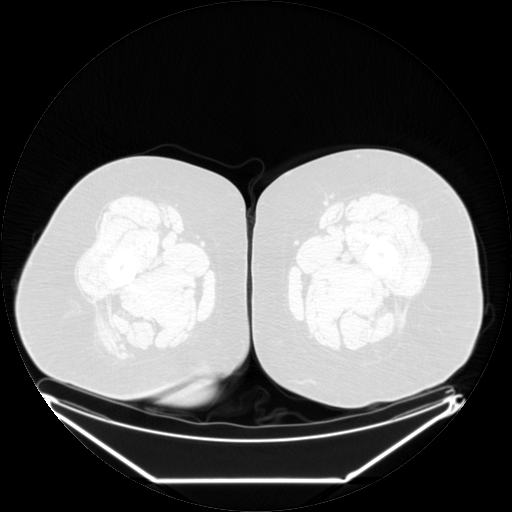
[im 67/265]
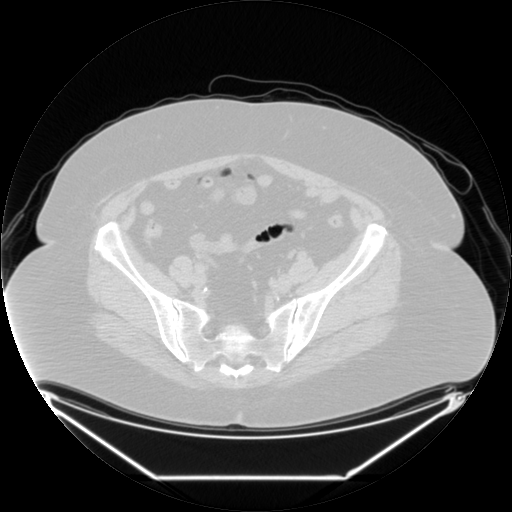
[im 133/265]
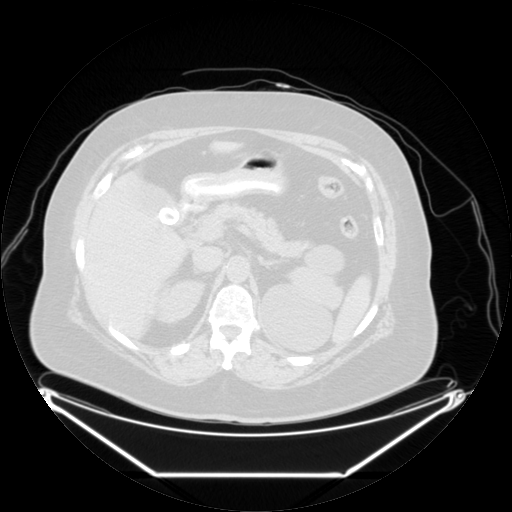
[im 199/265]
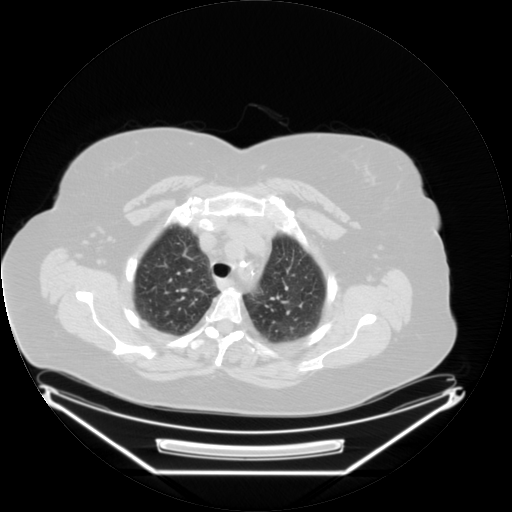
[im 265/265  brain]
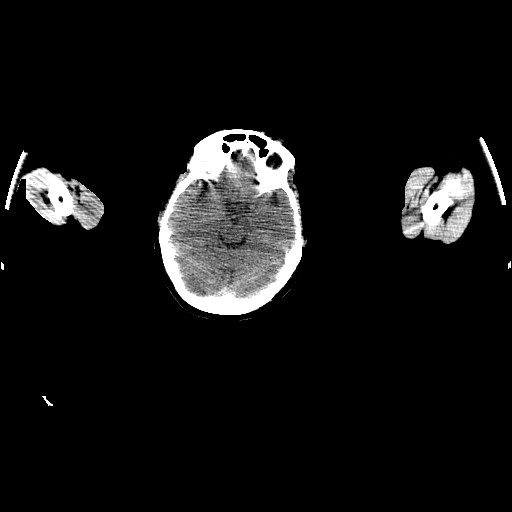

[Series 123: mip · coronal · 3.3mm · 4.69mm/px · 1 of 30 slices shown]
[im 1/30]
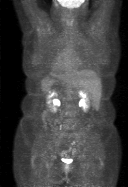

[Series 151: reformatted · axial · 3.3mm · 3.91mm/px · z∈[-888,-18]mm · 6 of 267 slices shown (1 of 2)]
[im 1/267]
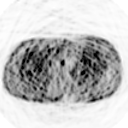
[im 54/267]
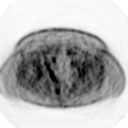
[im 107/267]
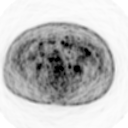
[im 160/267]
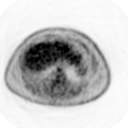
[im 213/267]
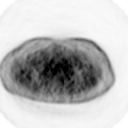
[im 267/267]
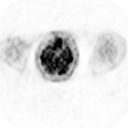

[Series 153: reformatted · coronal · 4.7mm · 6.98mm/px · 2 of 74 slices shown (2 of 2)]
[im 1/74]
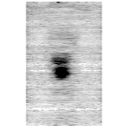
[im 74/74]
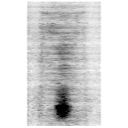

[25 of 25 positions shown; findings below may reference images not displayed]

FINDINGS: PET images demonstrate no abnormal activity within the
neck.  The chest/pulmonary activity projects minimally cephalad to
the corresponding CT images.  Hypermetabolism which is felt to
correspond to the previously described solid left upper lobe lung
nodule.  This measures 1.3 cm and  a S.U.V. max of 3.2 on image 74.

The other pulmonary ground-glass and soft tissue density nodules
are not significantly hypermetabolic.

A prevascular node measures 8 mm and demonstrates nonspecific low
level hypermetabolism.  This measures a S.U.V. max of 2.8.  This
node is similar in size back to 08/20/2010.

CT images performed for attenuation correction demonstrate mild
cardiomegaly with coronary artery atherosclerosis.  A small hiatal
hernia.  Pulmonary artery enlargement.
Redemonstration of ground-glass and soft tissue nodules
bilaterally.

Small right renal collecting system calculi.  Left renal lesions,
likely cysts.
Cholelithiasis. Normal adrenal glands.
IMPRESSION: 1.  The left upper lobe lung nodule corresponds to mild
hypermetabolism.  This is suspicious for a primary bronchogenic
carcinoma, likely adenocarcinoma.  Tissue sampling should be
considered.
2.  Mild nonspecific hypermetabolism within an upper limits of
normal prevascular node.  Given size stability back to 08/20/2010,
benign/reactive node is favored.
3.  The other pulmonary nodules are not significantly
hypermetabolic.  Underlying low grade adenocarcinoma(s) remain a
concern. This could be reevaluated on follow-up.  4.  No evidence
of extrathoracic disease.
5.  Incidental findings, as detailed above.

## 2013-04-16 IMAGING — CR DG CHEST 2V
2 series · 2 of 2 positions shown · non-contrast
Comparison: PET CT [HOSPITAL] 09/16/2011.

CLINICAL DATA: Preoperative exam.  Lung lesions.

CHEST - 2 VIEW

[view not recorded (1 of 2)]
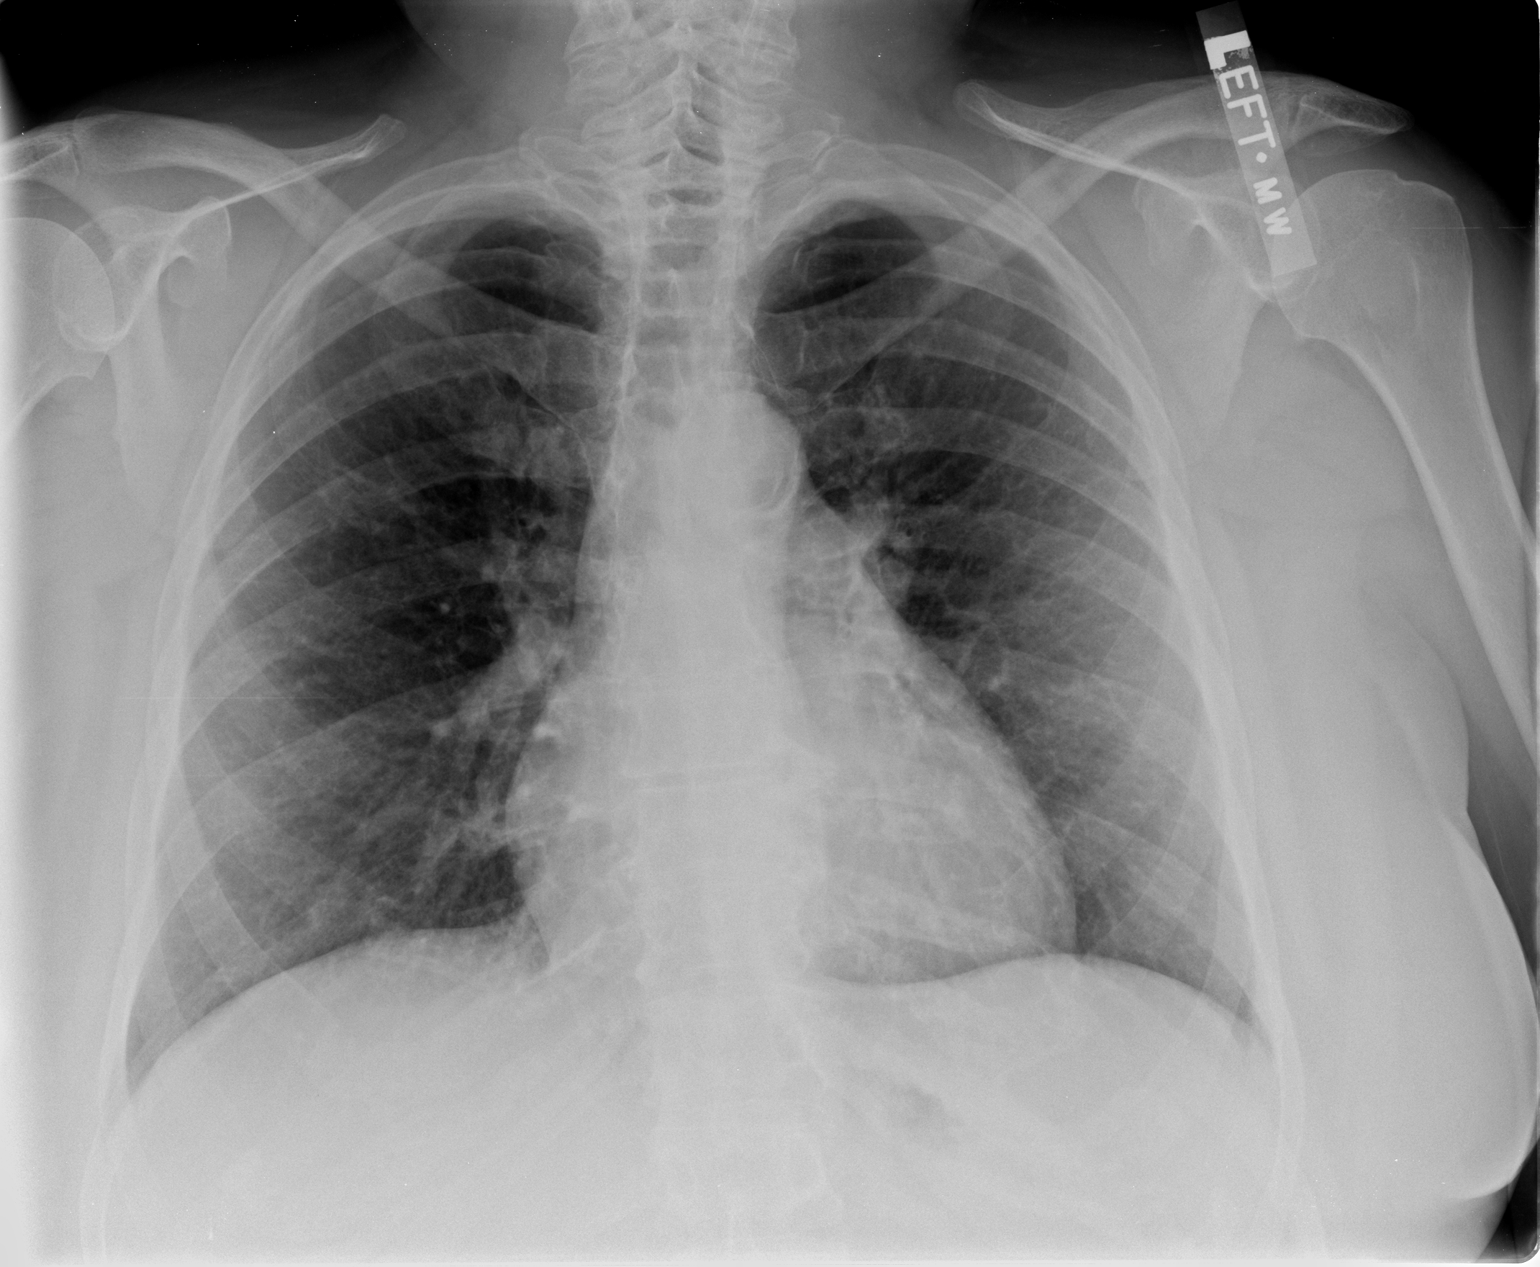

[view not recorded (2 of 2)]
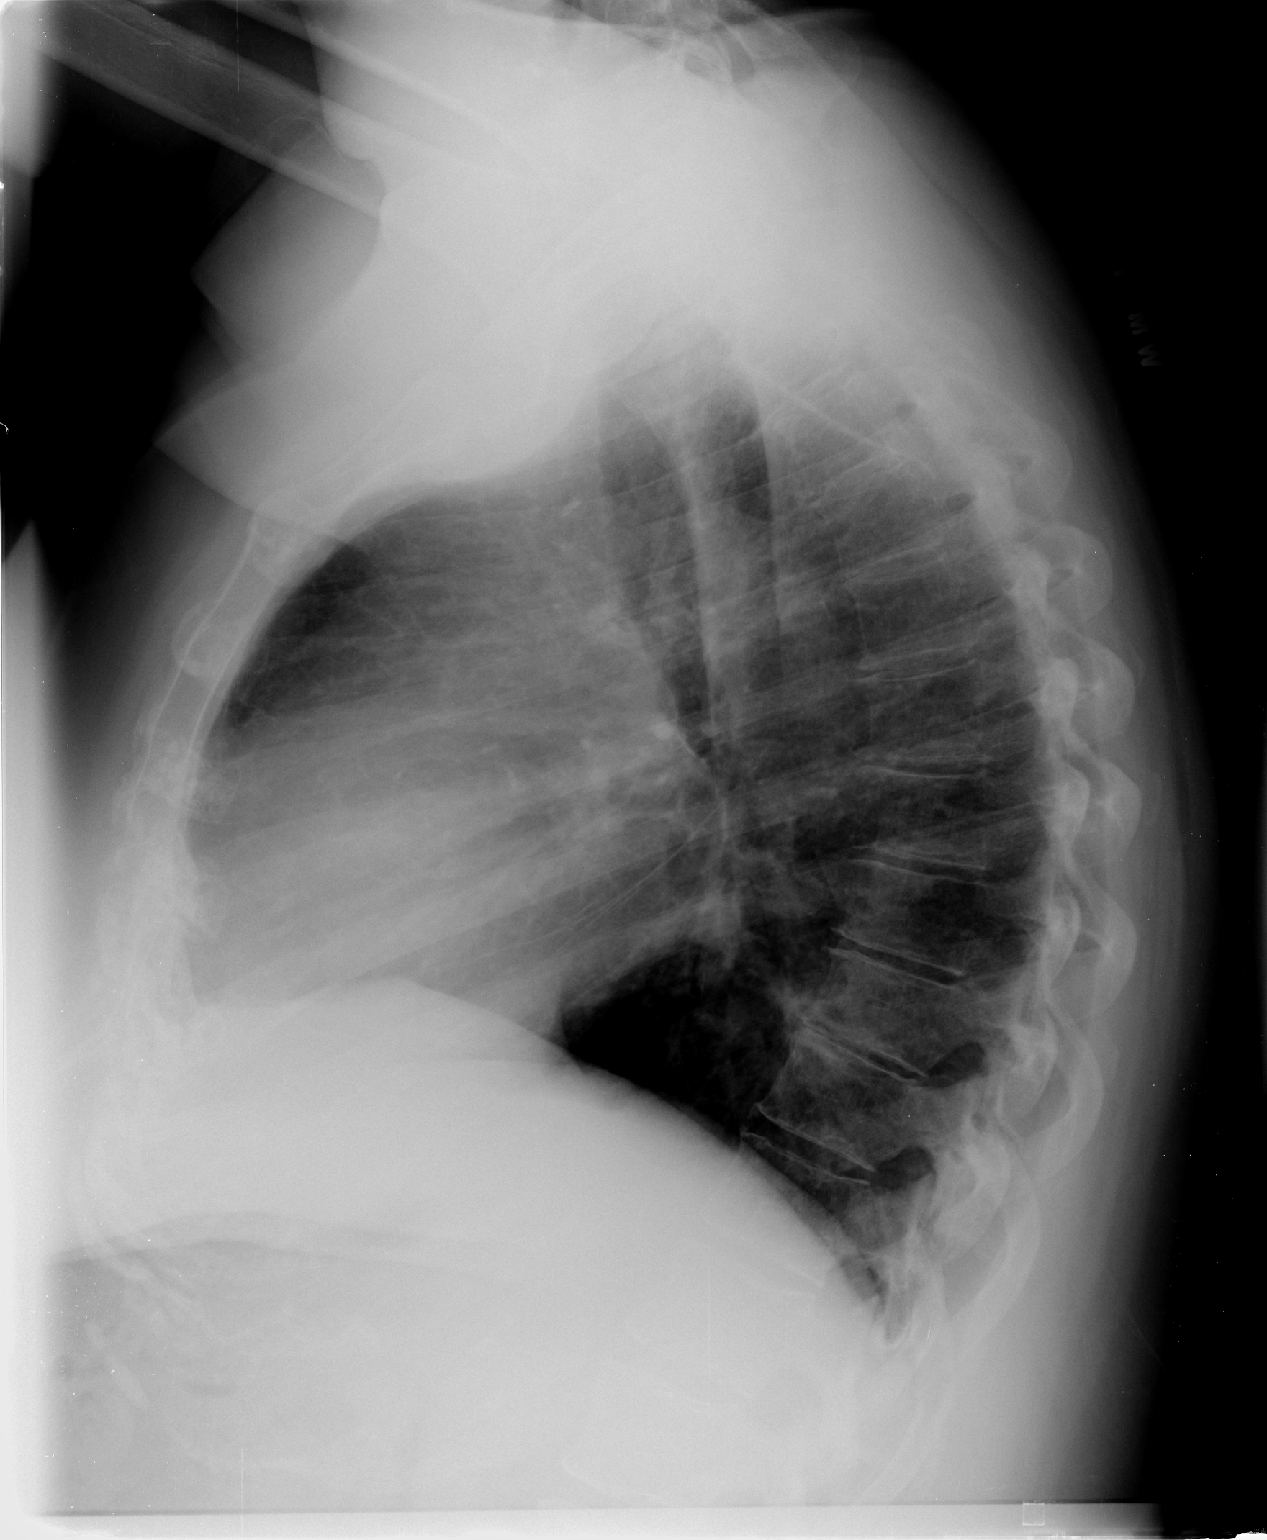

[2 of 2 positions shown; findings below may reference images not displayed]

FINDINGS: The CT detected pulmonary nodules are not well delineated
on present plain film examination.

No infiltrate, congestive heart failure or pneumothorax.

Calcified mildly tortuous aorta.  Heart size is within normal
limits.

Mild thoracic kyphosis.
IMPRESSION: The CT detected pulmonary nodules are not well delineated on
present plain film examination.

No infiltrate, congestive heart failure or pneumothorax.

## 2013-04-18 IMAGING — CR DG CHEST 1V PORT
1 series · 1 of 1 positions shown · non-contrast
Comparison: 09/29/2011.

CLINICAL DATA: Postoperative radiograph.  Left thoracoscopy with
wedge resection.  Wheezing.

PORTABLE CHEST - 1 VIEW

[view not recorded]
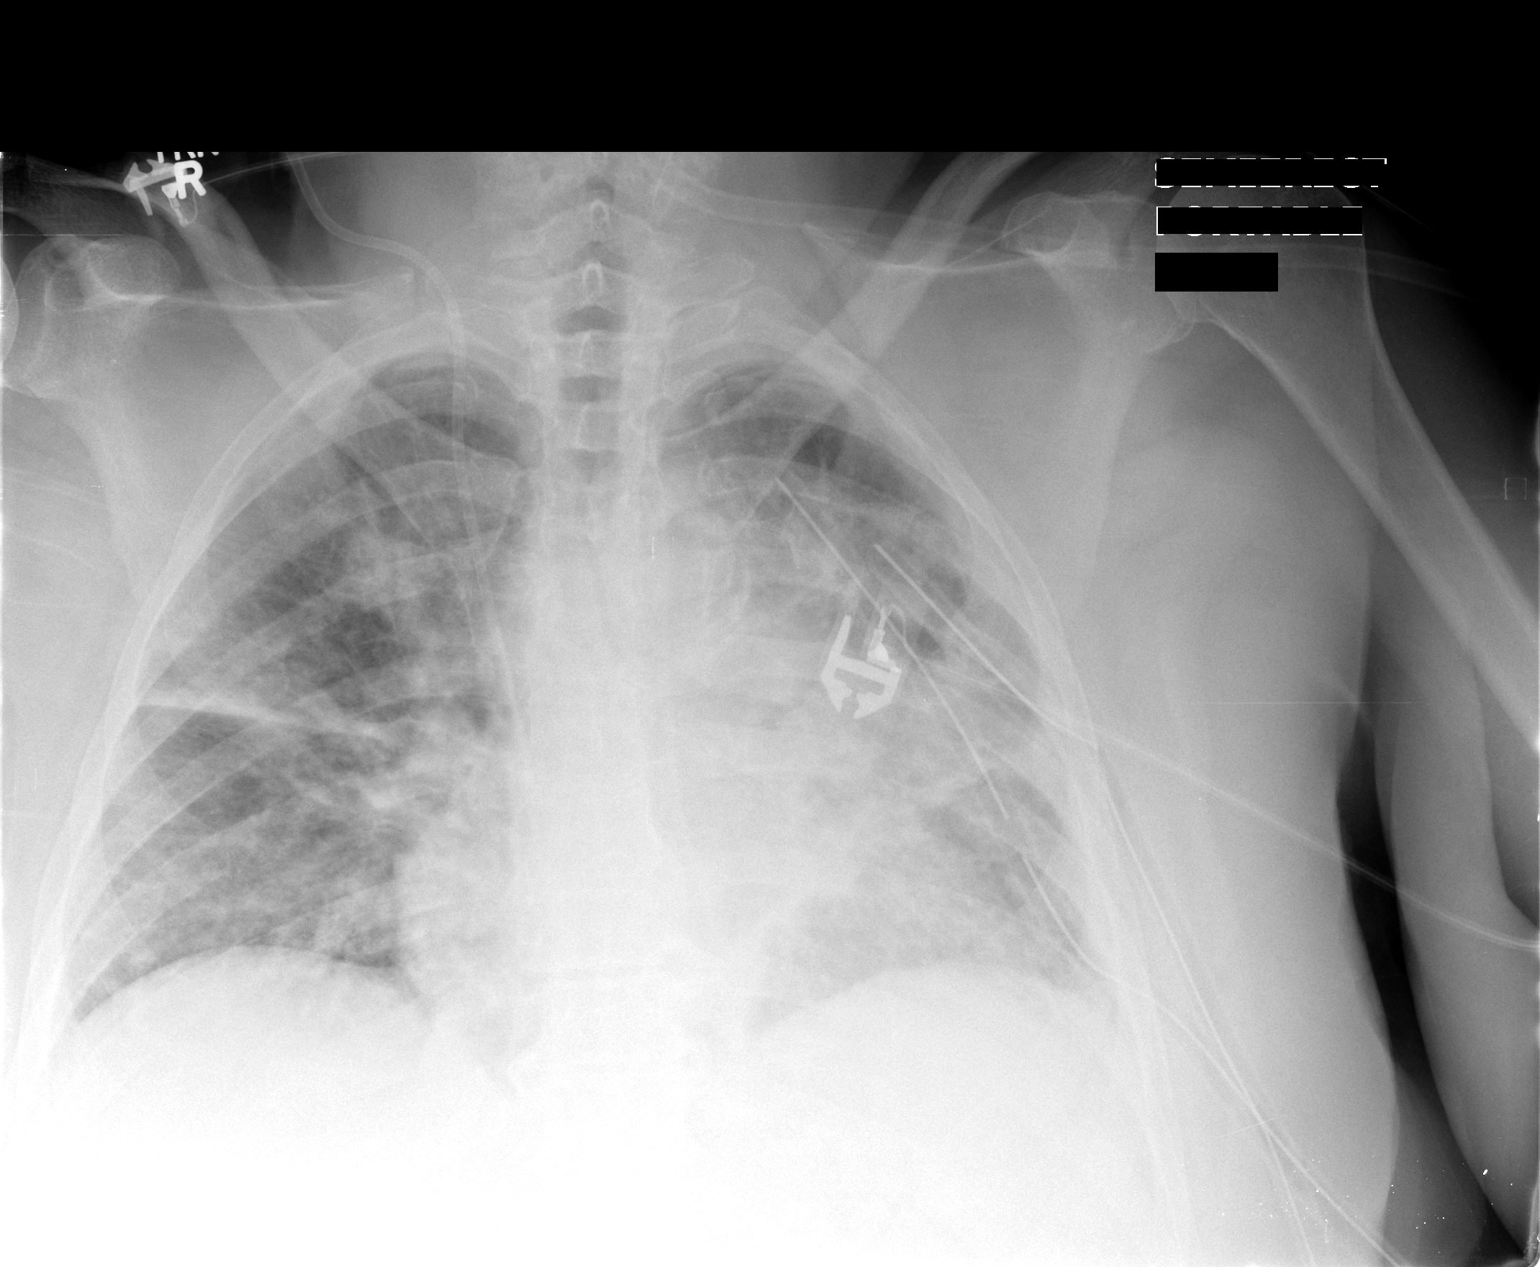

[1 of 1 positions shown; findings below may reference images not displayed]

FINDINGS: Dual left thoracostomy tubes are present directed towards
the apex.  Side port of the more superior tube is at the rib cage.
There is no pneumothorax identified.  Airspace disease is present
in the left lung, along with atelectasis.  Airspace disease and
atelectasis also affects the right lung.  The lung volumes are low.
The right IJ central line is present with the tip in the mid SVC.
Cardiopericardial silhouette is partially obscured but size appears
within normal limits allowing for low volumes.
IMPRESSION: 1.  Postoperative changes of left thoracoscopy with dual left sided
chest tubes.  Superior chest tube side port is at the rib cage.  No
pneumothorax.
2.  The right IJ central line with the tip in the mid SVC.
3.  Left greater than right bilateral airspace disease and
atelectasis.  This probably represents pulmonary edema/volume
overload.

## 2013-04-19 IMAGING — CR DG CHEST 1V PORT
1 series · 1 of 1 positions shown · non-contrast
Comparison: 10/01/2011.

CLINICAL DATA: Left thoracostomy tubes.  VATS.

PORTABLE CHEST - 1 VIEW

[view not recorded]
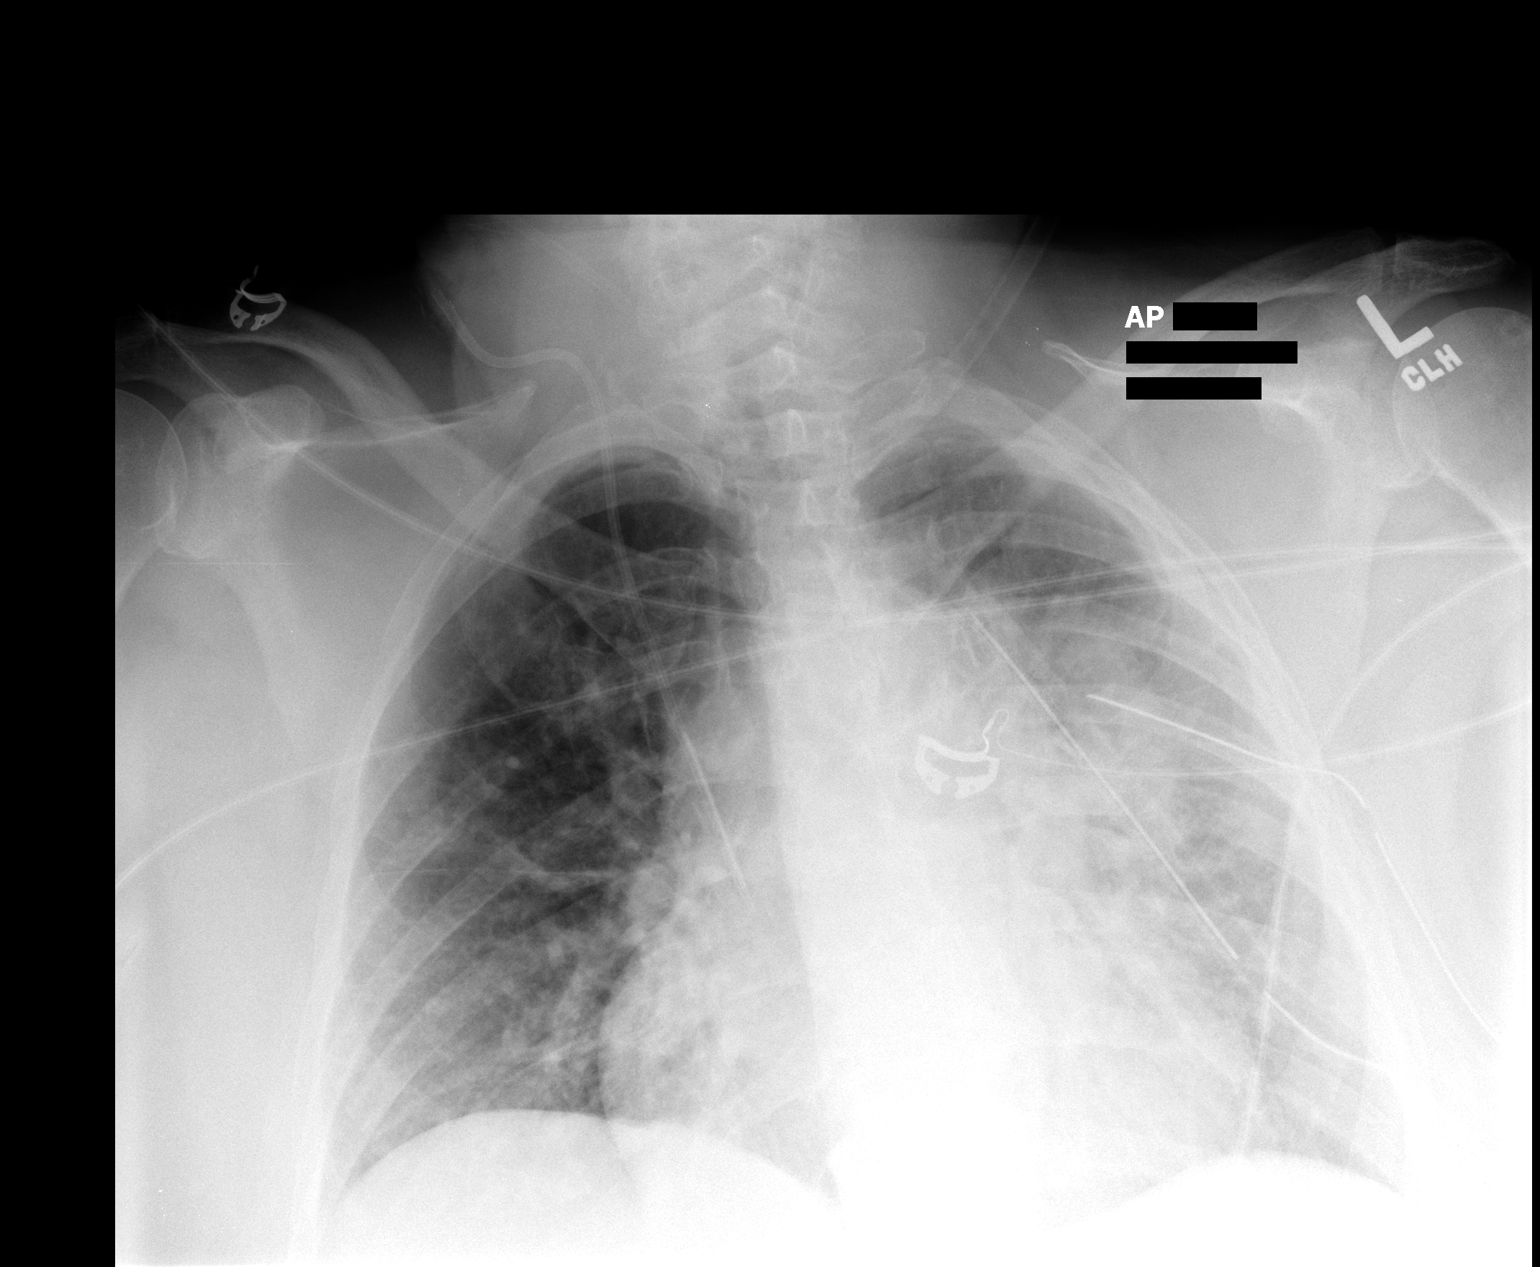

[1 of 1 positions shown; findings below may reference images not displayed]

FINDINGS: Dual left thoracostomy tubes are present.  The more
superior right chest tube has been pulled back about 1 cm, with the
side port in the soft tissues.  More inferior chest tube remains
unchanged.  Right IJ central line unchanged.  Improving aeration is
present bilaterally, with persistent airspace disease in the left
midlung.  Decreasing atelectasis is present bilaterally.  Low
volumes are present.  This accentuates the size of the
cardiopericardial silhouette.
IMPRESSION: 1.  Dual left thoracostomy tubes.  The superior tube has been
retracted about 1 cm with the side port in the soft tissues.  No
pneumothorax.
2.  Overall improving aeration with decreasing atelectasis.  The
lung volumes remain low.

## 2013-04-20 IMAGING — CR DG CHEST 1V PORT
1 series · 1 of 1 positions shown · non-contrast
Comparison: 10/02/2011

CLINICAL DATA: Left chest tubes, follow-up

PORTABLE CHEST - 1 VIEW

[AP]
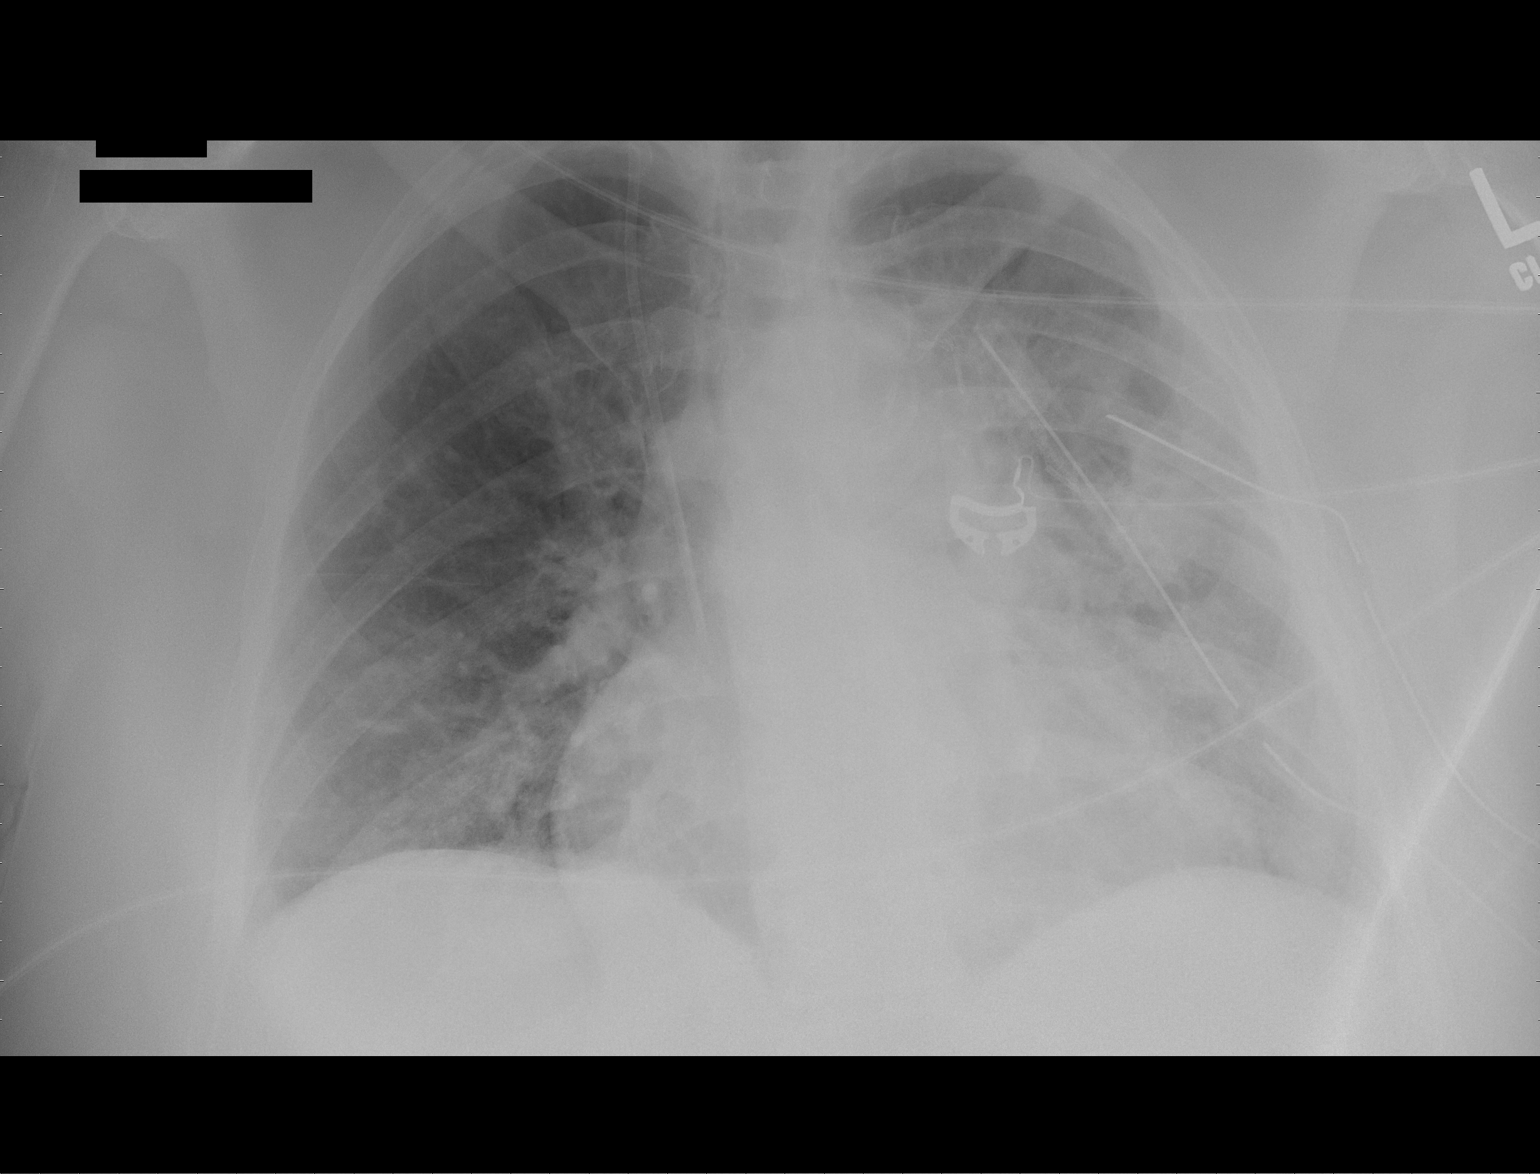

[1 of 1 positions shown; findings below may reference images not displayed]

FINDINGS: 2 left chest tubes remain.  No pneumothorax on today's
exam.  Right IJ central line tip in the SVC region.  Heart remains
enlarged with stable vascular congestion.  Postop changes in the
left upper hilar region with residual opacity.  No significant
change in aeration.
IMPRESSION: Stable postoperative findings.

## 2013-04-21 IMAGING — CR DG CHEST 1V PORT
1 series · 1 of 1 positions shown · non-contrast
Comparison: Portable exam 9749 hours compared 10/03/2011

CLINICAL DATA: Post VATS

PORTABLE CHEST - 1 VIEW

[view not recorded]
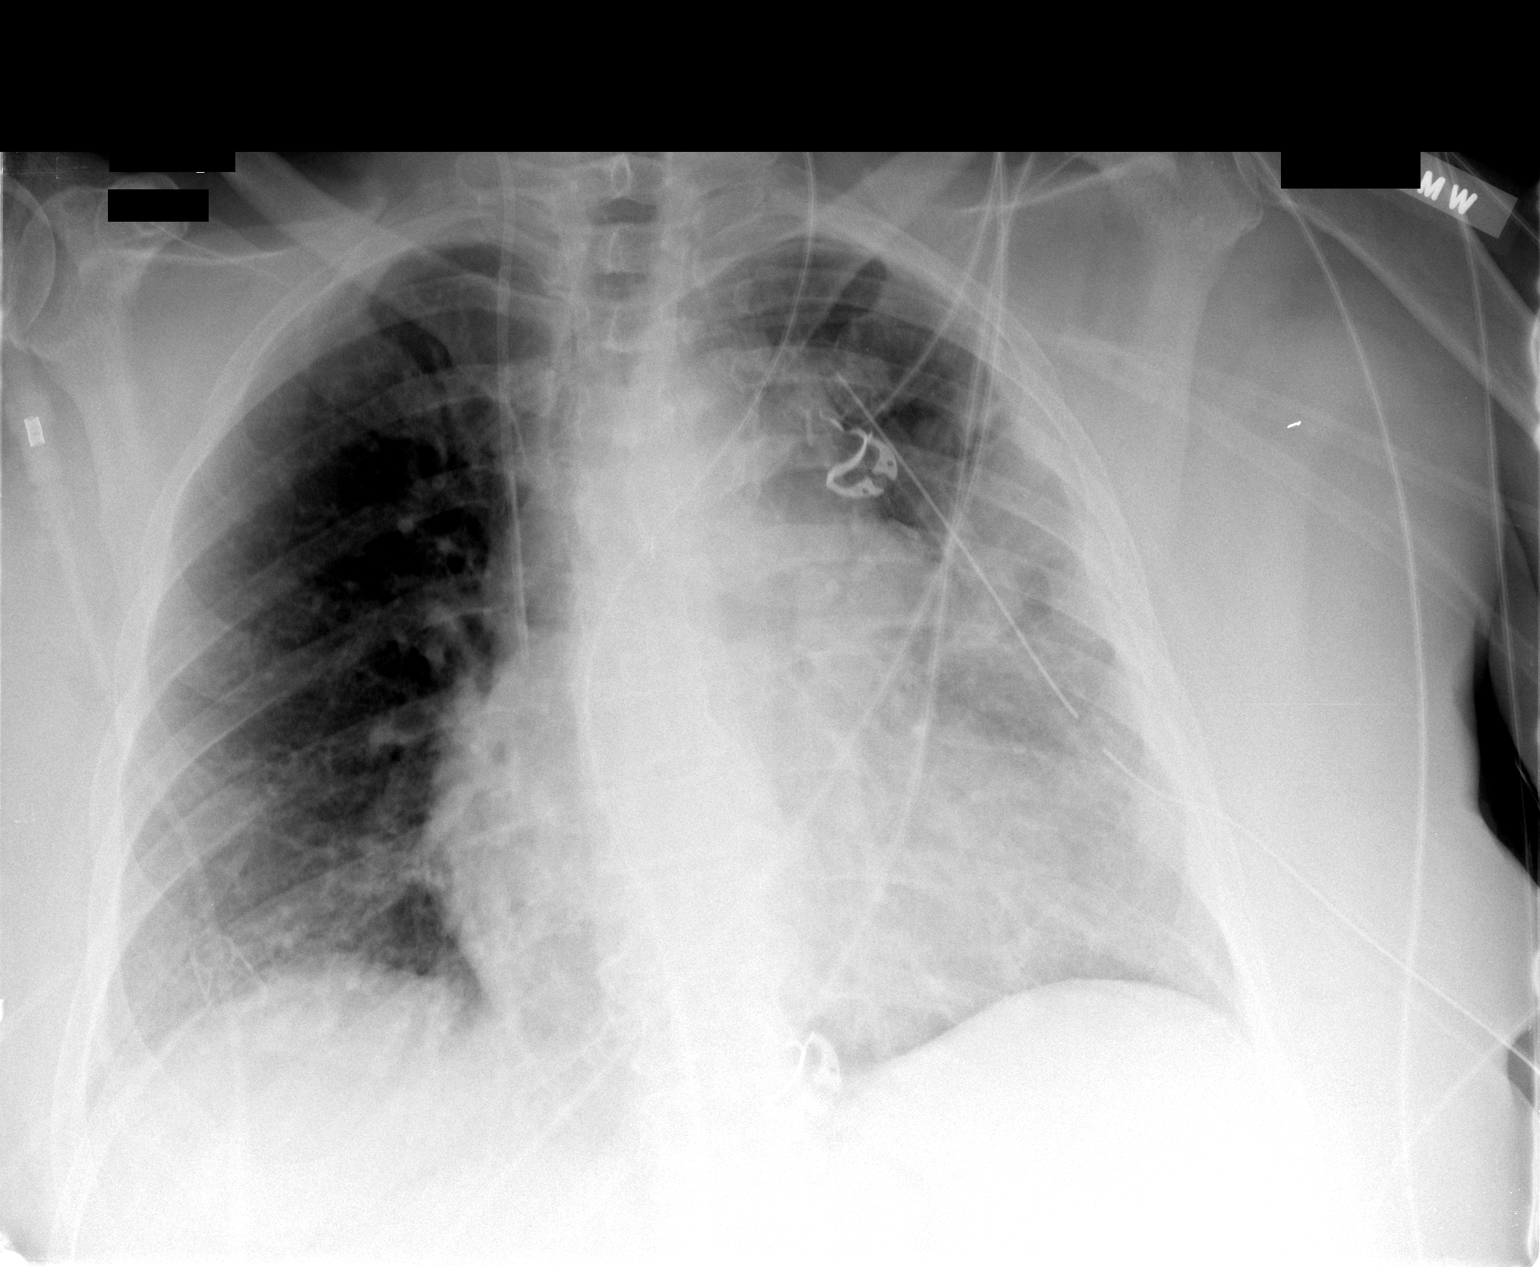

[1 of 1 positions shown; findings below may reference images not displayed]

FINDINGS: Single left thoracostomy tube and right jugular line stable.
Enlargement of cardiac silhouette.
Mediastinum slightly prominent question related to technique.
Atherosclerotic calcification aorta.
Minimal residual pleural thickening or fluid lateral left chest.
Minimal bibasilar atelectasis.
Very tiny left apex pneumothorax.
No acute osseous findings.
IMPRESSION: Tiny left apex pneumothorax and minimal left pleural effusion
versus pleural thickening.
Interval removal of one left thoracostomy tube.

## 2013-04-21 IMAGING — CR DG CHEST 1V PORT
1 series · 1 of 1 positions shown · non-contrast
Comparison: Earlier film of the same day

CLINICAL DATA: Shortness of breath, left chest pain, chest tube
removal

PORTABLE CHEST - 1 VIEW

[view not recorded]
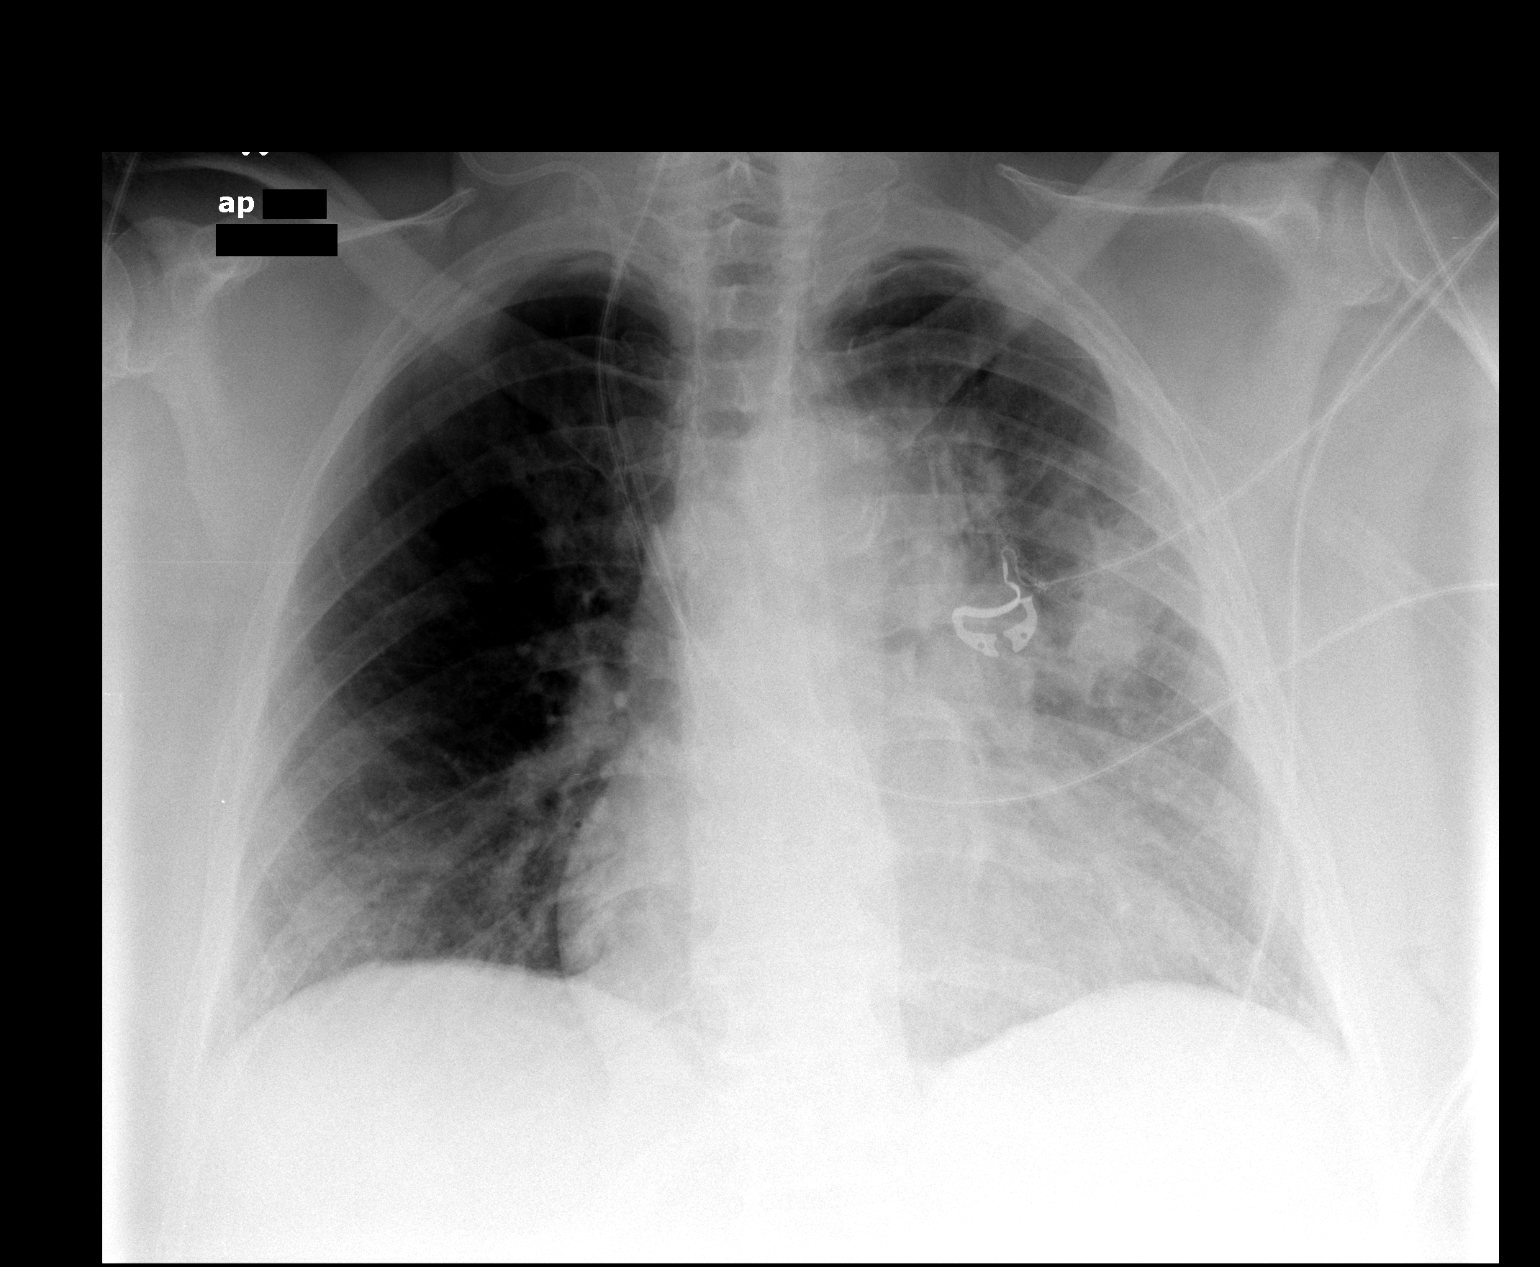

[1 of 1 positions shown; findings below may reference images not displayed]

FINDINGS: Interval removal of the left chest tube with little
change in the tiny residual apical pneumothorax.  Left perihilar
and left midlung airspace opacities are stable.  Mild bibasilar
interstitial prominence.  No definite effusion.  Heart size within
normal limits for technique.  Right IJ central line stable.
Atheromatous aortic arch.
IMPRESSION: 1.  Left chest tube removal with stable tiny residual apical
pneumothorax.

## 2013-04-22 IMAGING — CR DG CHEST 2V
2 series · 2 of 2 positions shown · non-contrast
Comparison: 10/04/2011

CLINICAL DATA: Postop left lung surgery

CHEST - 2 VIEW

[w chest pa]
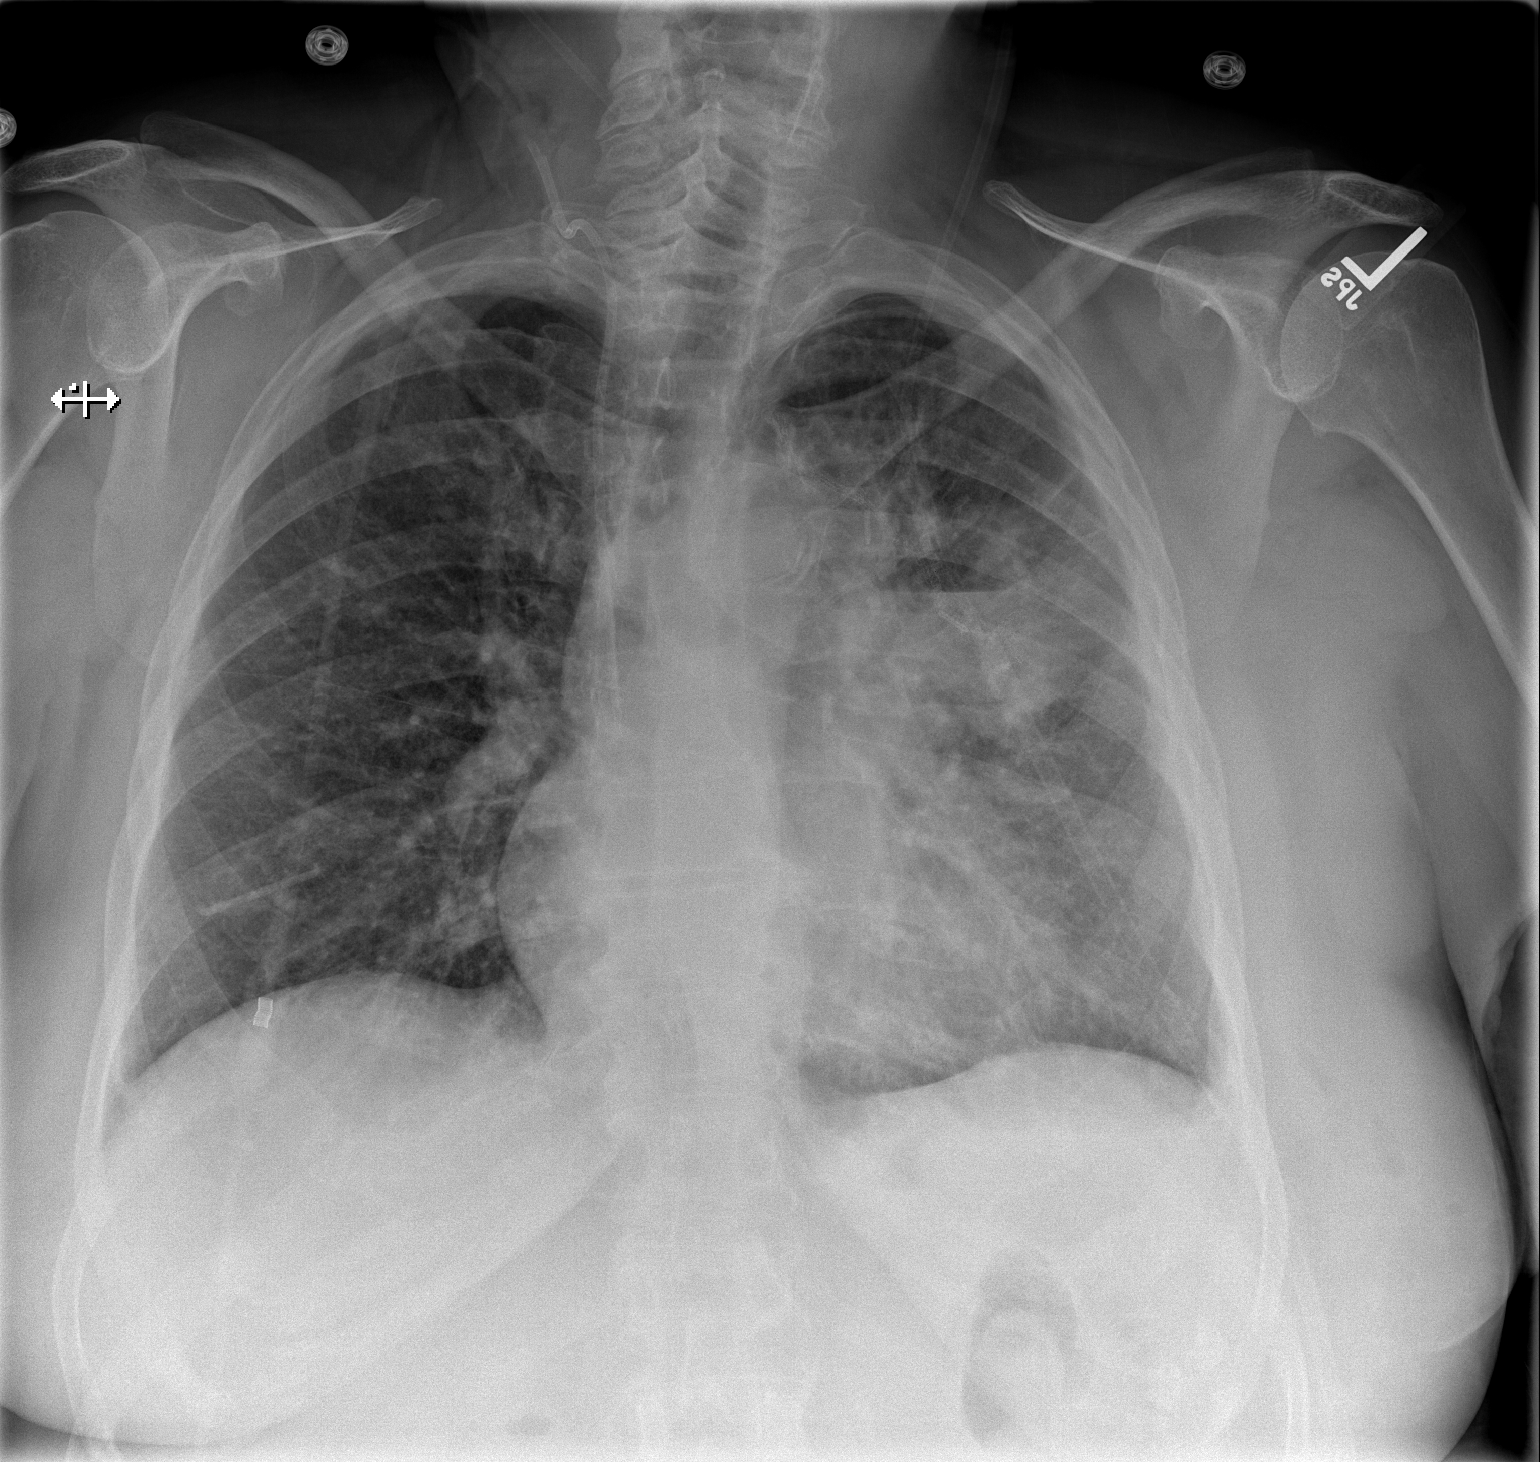

[w chest lat]
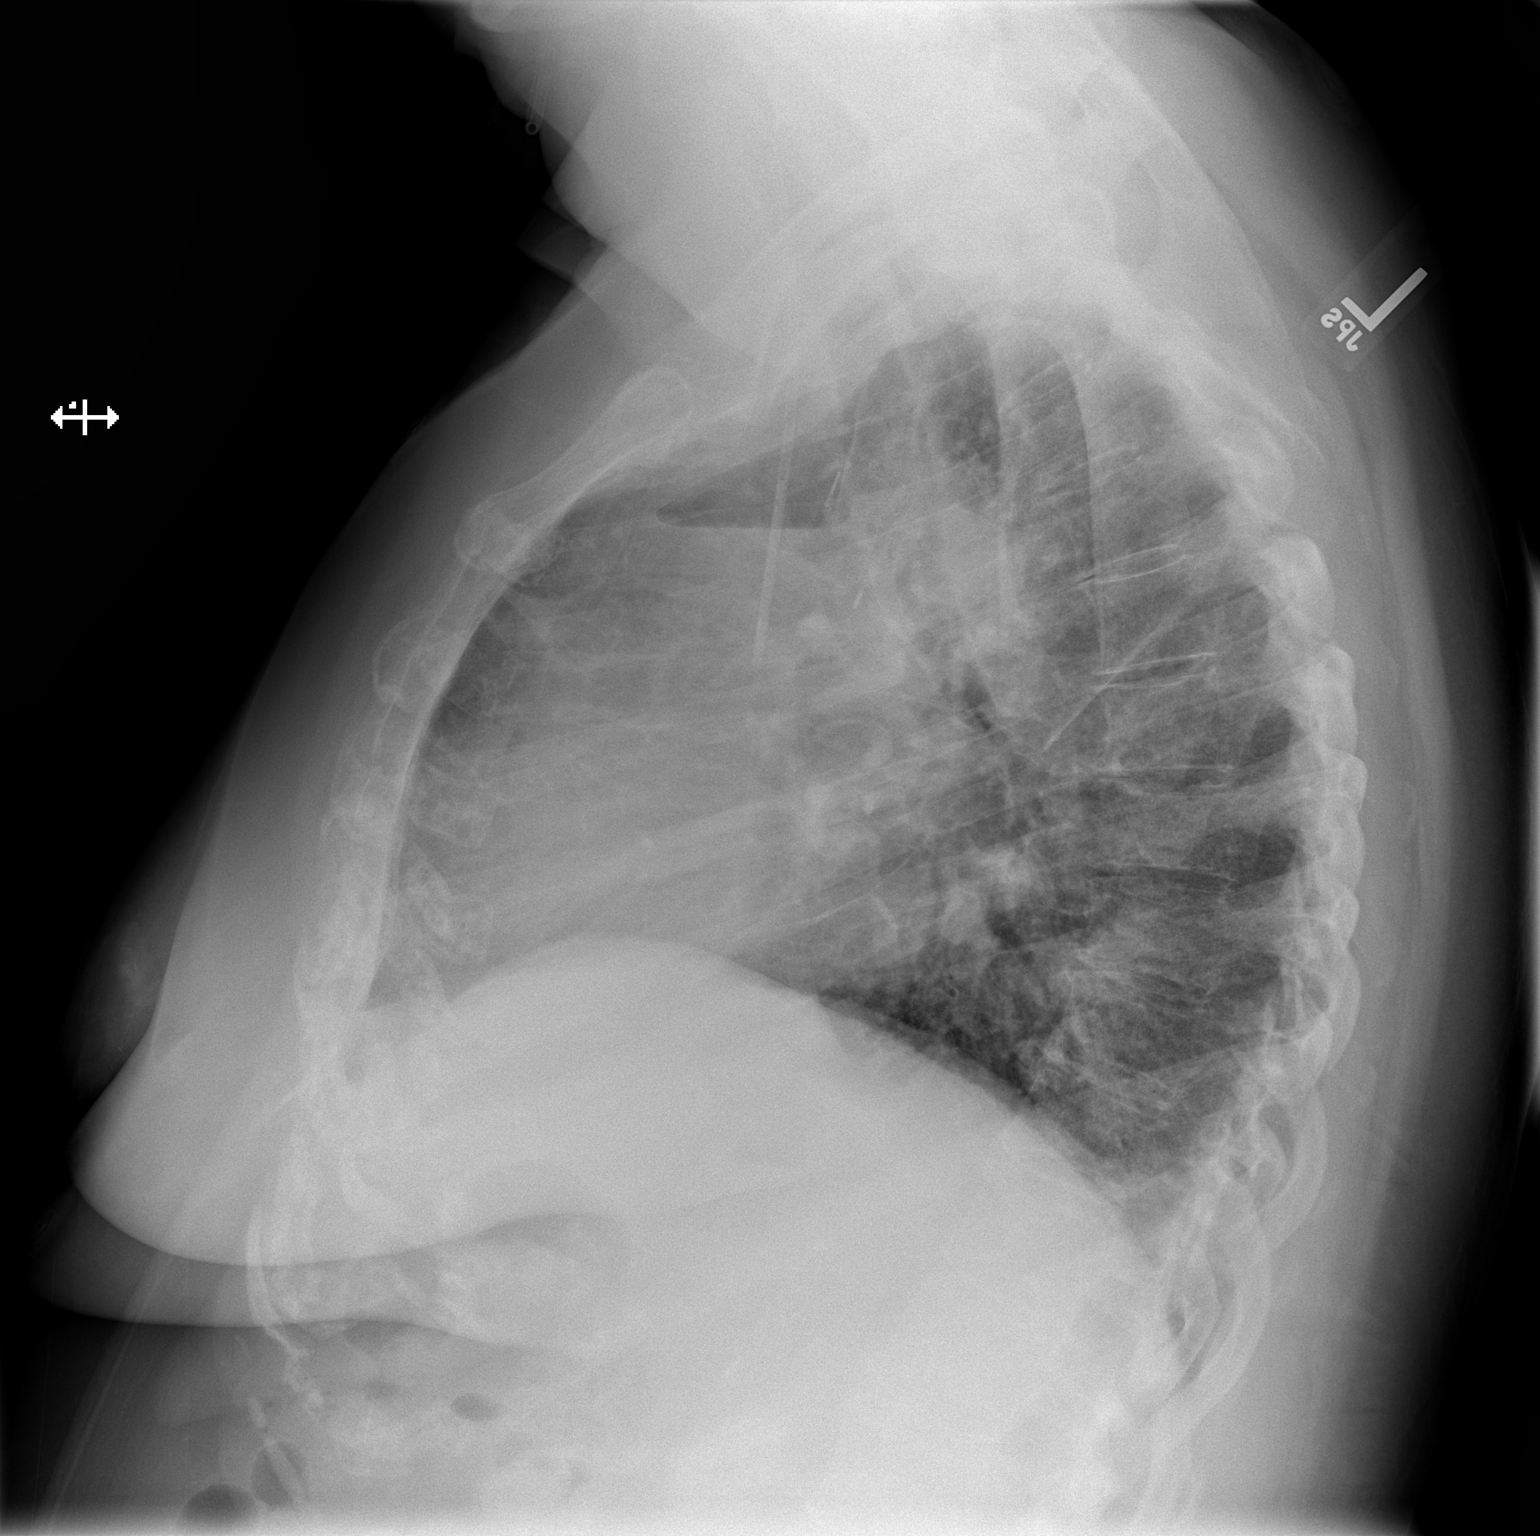

[2 of 2 positions shown; findings below may reference images not displayed]

FINDINGS: Surgical clips in the left mid lung.  Left mid lung
opacity with interval development of an air-fluid level.  Tiny left
apical pneumothorax.

Mild interstitial prominence, possibly reflecting superimposed
interstitial edema.

Stable right IJ venous catheter.

Cardiomediastinal silhouette is unchanged.
IMPRESSION: Postsurgical changes in the left mid lung.  Stable tiny left apical
pneumothorax.

Left mid lung opacity with interval development of an air-fluid
level.

Possible mild superimposed interstitial edema.

## 2013-04-23 ENCOUNTER — Ambulatory Visit (INDEPENDENT_AMBULATORY_CARE_PROVIDER_SITE_OTHER): Payer: MEDICARE | Admitting: Pharmacist

## 2013-04-23 DIAGNOSIS — I4891 Unspecified atrial fibrillation: Secondary | ICD-10-CM

## 2013-04-23 LAB — POCT INR: INR: 2.2

## 2013-04-23 IMAGING — CR DG CHEST 2V
2 series · 2 of 2 positions shown · non-contrast
Comparison: 10/05/2011

CLINICAL DATA: Postop.  Shortness of breath, cough.

CHEST - 2 VIEW

[w chest pa]
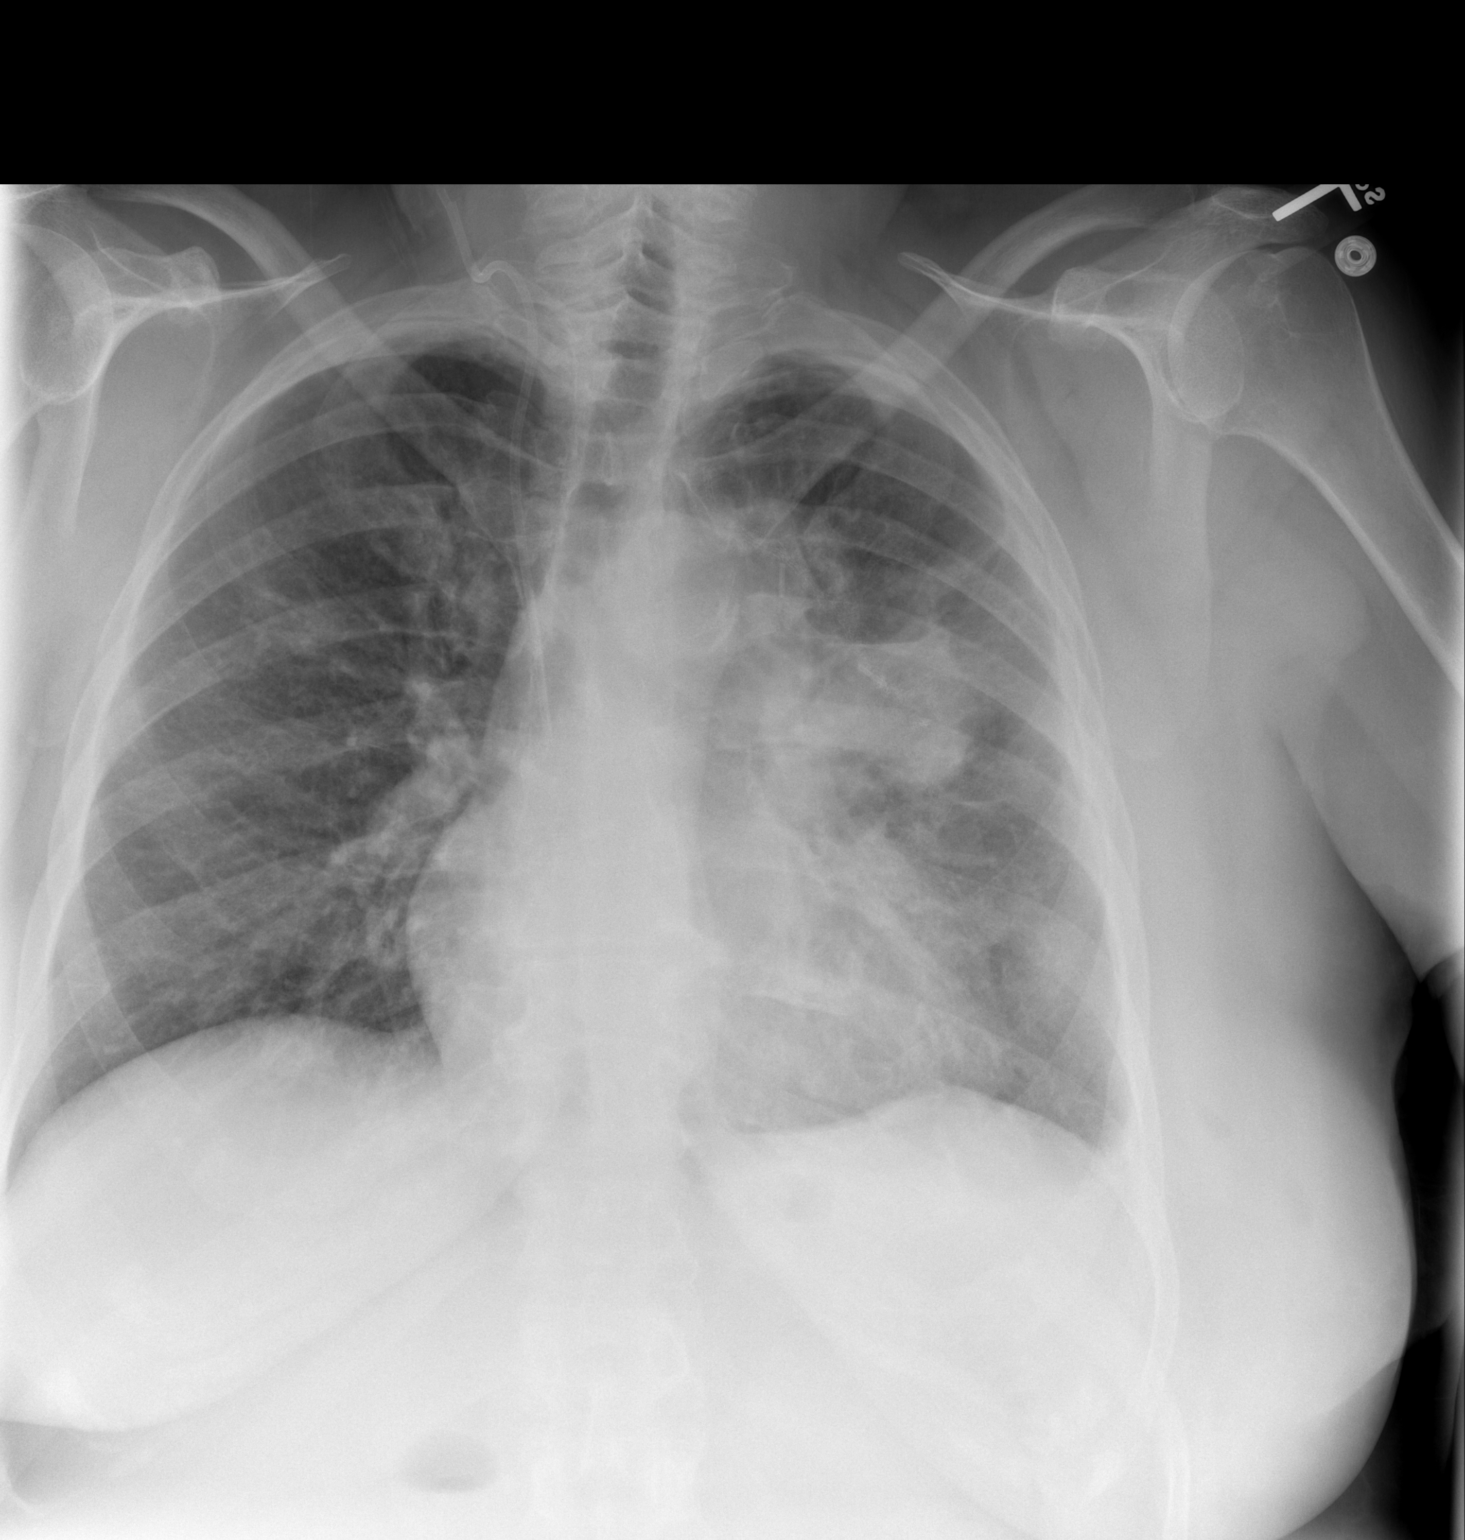

[w chest lat]
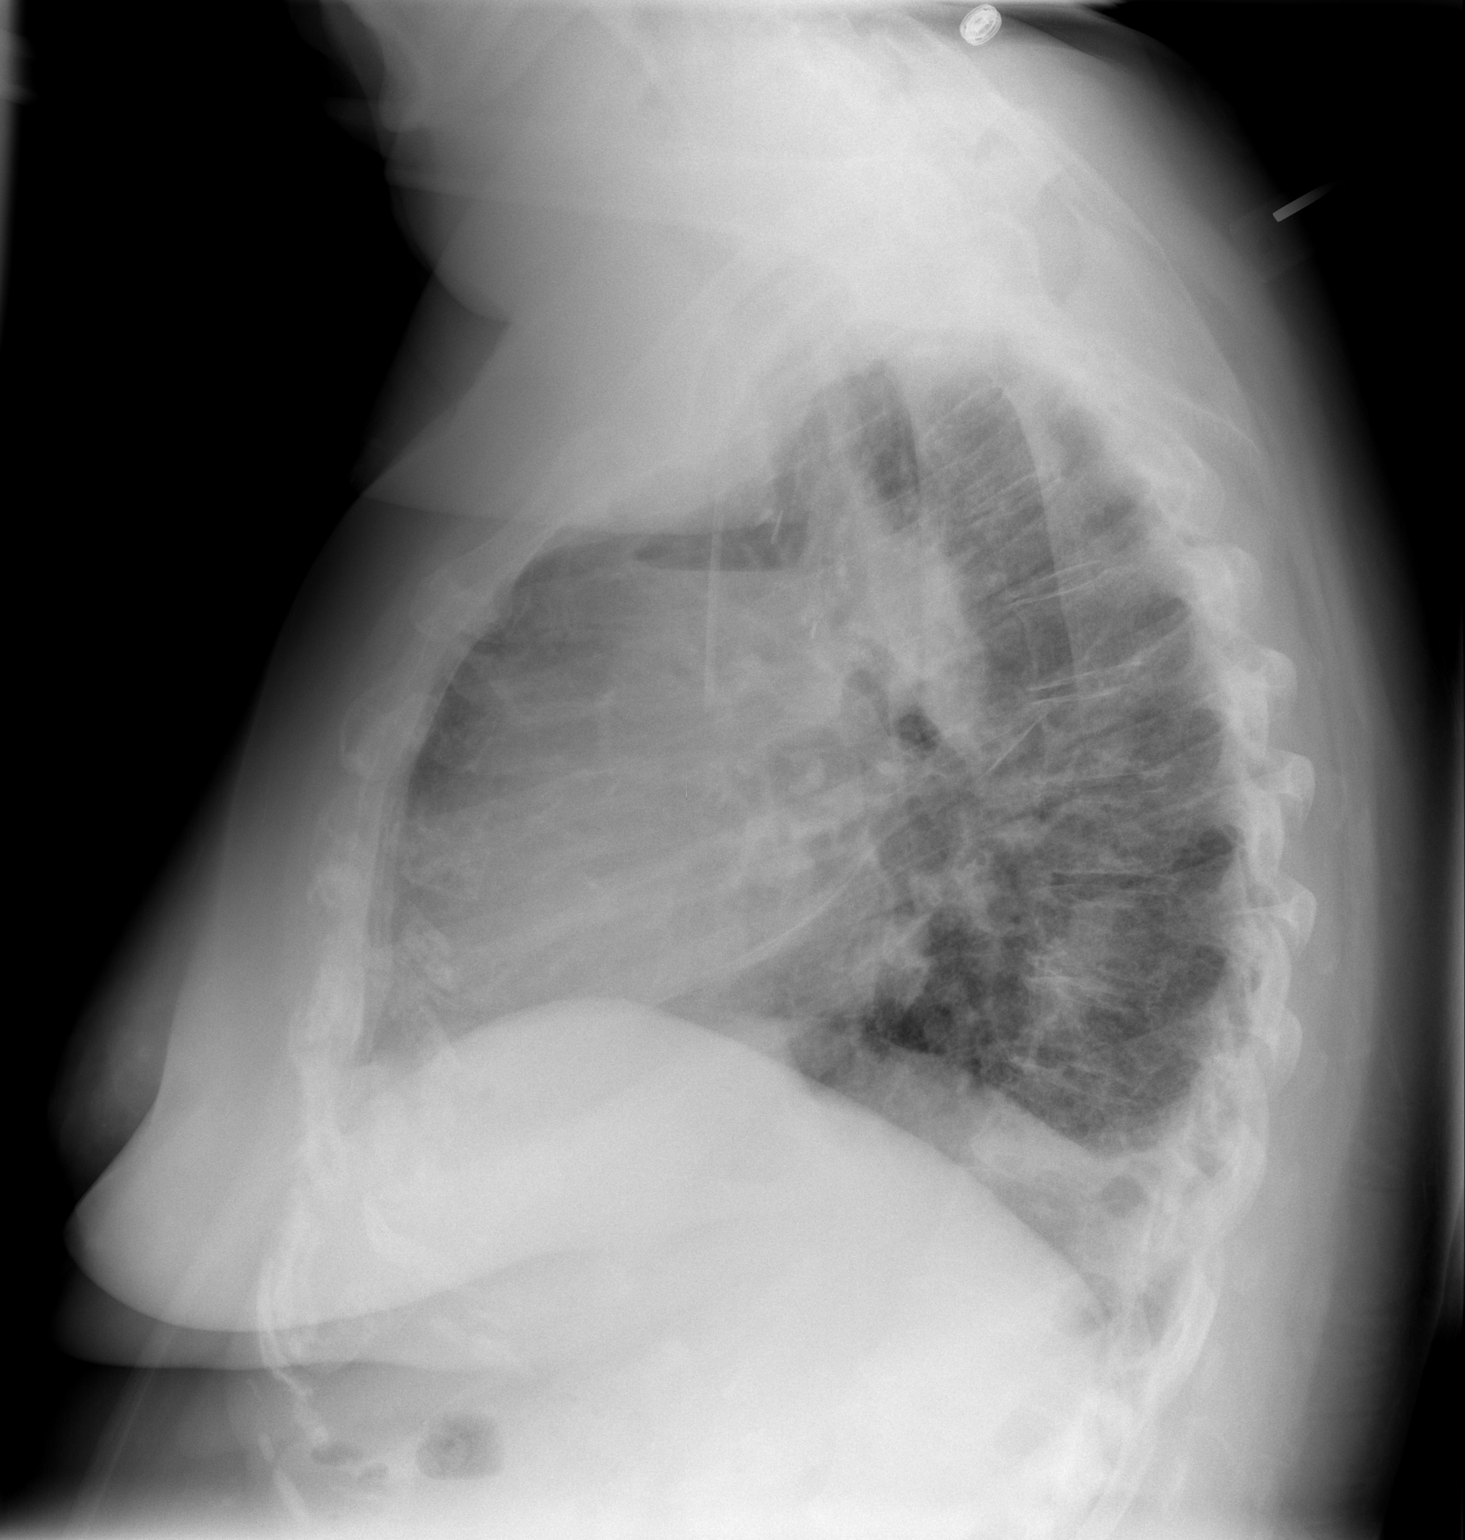

[2 of 2 positions shown; findings below may reference images not displayed]

FINDINGS: Postsurgical changes again seen in the left mid lung.
Tiny left apical pneumothorax again noted, stable.  Left midlung
opacity with air-fluid level is again seen, unchanged.  Small left
pleural effusion.  Patchy left lung airspace disease.
IMPRESSION: No significant change since prior study.

## 2013-04-24 ENCOUNTER — Ambulatory Visit (HOSPITAL_COMMUNITY)
Admission: RE | Admit: 2013-04-24 | Discharge: 2013-04-24 | Disposition: A | Discharge status: Home / Self Care | Payer: Medicare Other | Source: Ambulatory Visit | Attending: Internal Medicine | Admitting: Internal Medicine

## 2013-04-24 ENCOUNTER — Other Ambulatory Visit (HOSPITAL_BASED_OUTPATIENT_CLINIC_OR_DEPARTMENT_OTHER): Payer: Medicare Other | Admitting: Lab

## 2013-04-24 ENCOUNTER — Encounter (HOSPITAL_COMMUNITY): Payer: Self-pay

## 2013-04-24 DIAGNOSIS — Z902 Acquired absence of lung [part of]: Secondary | ICD-10-CM | POA: Insufficient documentation

## 2013-04-24 DIAGNOSIS — C3490 Malignant neoplasm of unspecified part of unspecified bronchus or lung: Secondary | ICD-10-CM | POA: Insufficient documentation

## 2013-04-24 DIAGNOSIS — K8020 Calculus of gallbladder without cholecystitis without obstruction: Secondary | ICD-10-CM | POA: Insufficient documentation

## 2013-04-24 LAB — COMPREHENSIVE METABOLIC PANEL (CC13)
ALT: 14 U/L (ref 0–55)
CO2: 31 mEq/L — ABNORMAL HIGH (ref 22–29)
Calcium: 9.8 mg/dL (ref 8.4–10.4)
Chloride: 101 mEq/L (ref 98–107)
Creatinine: 0.8 mg/dL (ref 0.6–1.1)
Glucose: 99 mg/dl (ref 70–99)
Total Protein: 7.2 g/dL (ref 6.4–8.3)

## 2013-04-24 LAB — CBC WITH DIFFERENTIAL/PLATELET
Basophils Absolute: 0.1 10*3/uL (ref 0.0–0.1)
EOS%: 2.4 % (ref 0.0–7.0)
HCT: 42.7 % (ref 34.8–46.6)
LYMPH%: 27.4 % (ref 14.0–49.7)
MONO%: 8.5 % (ref 0.0–14.0)
NEUT#: 4.2 10*3/uL (ref 1.5–6.5)
NEUT%: 61 % (ref 38.4–76.8)
RDW: 13.8 % (ref 11.2–14.5)
WBC: 6.9 10*3/uL (ref 3.9–10.3)

## 2013-04-26 ENCOUNTER — Telehealth: Payer: Self-pay | Admitting: Internal Medicine

## 2013-04-26 ENCOUNTER — Other Ambulatory Visit: Payer: Self-pay | Admitting: Internal Medicine

## 2013-04-26 ENCOUNTER — Ambulatory Visit (HOSPITAL_BASED_OUTPATIENT_CLINIC_OR_DEPARTMENT_OTHER): Payer: Medicare Other | Admitting: Internal Medicine

## 2013-04-26 ENCOUNTER — Encounter: Payer: Self-pay | Admitting: Internal Medicine

## 2013-04-26 DIAGNOSIS — C3490 Malignant neoplasm of unspecified part of unspecified bronchus or lung: Secondary | ICD-10-CM

## 2013-04-26 NOTE — Patient Instructions (Signed)
No significant evidence for disease progression on the recent scan. Followup visit in 6 months with repeat CT scan of the chest.

## 2013-04-26 NOTE — Progress Notes (Signed)
[facility] [phone]   [phone]  OFFICE PROGRESS NOTE  Rudi Heap, MD [address] [place] [address]  DIAGNOSIS: Stage IA (T1a., N0, M0) non-small cell lung cancer consistent with adenocarcinoma with negative EGFR mutation and negative ALK gene translocation diagnosed in September of 2012. The patient also has bilateral groundglass opacities suspicious for low-grade adenocarcinoma.   PRIOR THERAPY: Status post left VATS with wedge resection of the left upper lobe lesion and node sampling under the care of Dr. Edwyna Shell on 10/01/2011   CURRENT THERAPY: Observation.  INTERVAL HISTORY: Tammie [name] 72 y.o. female returns to the clinic today for routine six-month followup visit. The patient is feeling fine today with no specific complaints. She has no significant issues since her last visit except for the back pain which is currently improved. She denied having any significant weight loss or night sweats. She has no chest pain but continues to have shortness breath with exertion, no cough or hemoptysis. She has repeat CT scan of the chest performed recently and she is here for evaluation and discussion of her scan results.  MEDICAL HISTORY: Past Medical History  Diagnosis Date  . Asthma   . Fibrocystic disease of breast   . Mini stroke     x2. Dr. Jefm Miles  / Dr. Lenora Boys   . Hematuria     Dr. Porfirio Mylar    . Renal calculi   . Hyperlipidemia   . Obesity   . Atrial fibrillation     on coumadin   . Hypertension   . Arthritis   . Seasonal allergies   . Nephrolithiasis   . Cystic disease of breast   . 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 Stroke   . Renal calculi     ALLERGIES:  is allergic to contrast media; iohexol; sulfa antibiotics; and sulfamethoxazole w-trimethoprim.  MEDICATIONS:  Current Outpatient Prescriptions  Medication Sig Dispense Refill  . ALBUTEROL IN Inhale 90 mcg into the lungs as needed.       . calcium  carbonate (OS-CAL - DOSED IN MG OF ELEMENTAL CALCIUM) 1250 MG tablet Take 1 tablet by mouth daily. take 1/2 tablet      . cholecalciferol (VITAMIN D) 1000 UNITS tablet Take 1,000 Units by mouth daily.       . fish oil-omega-3 fatty acids 1000 MG capsule Take 1 g by mouth daily.       . Flaxseed, Linseed, (FLAX SEED OIL PO) Take 1 capsule by mouth daily.       . Garlic 1000 MG CAPS Take 1 capsule by mouth 2 (two) times daily.       . hydrochlorothiazide (MICROZIDE) 12.5 MG capsule Take 2 capsules (25 mg total) by mouth daily.  30 capsule  0  . metoprolol (LOPRESSOR) 100 MG tablet Take 100 mg by mouth 2 (two) times daily.      . Multiple Vitamin (MULTIVITAMIN) tablet Take 1 tablet by mouth daily.       Marland Kitchen PROAIR HFA 108 (90 BASE) MCG/ACT inhaler       . simvastatin (ZOCOR) 10 MG tablet Take 10 mg by mouth at bedtime.      Marland Kitchen warfarin (COUMADIN) 5 MG tablet Take 2.5-5 mg by mouth See admin instructions. 0.5 tab daily except 1 tab daily on Tuesdays and Saturdays.      Marland Kitchen acetaminophen (TYLENOL) 500 MG tablet Take 1,000 mg by mouth every 6 (six) hours as needed.  For pain.      . [DISCONTINUED] Calcium Citrate-Vitamin D (CALCIUM CITRATE + D) 300-100 MG-UNIT TABS Take 0.5 tablets by mouth 2 (two) times daily.        . [DISCONTINUED] metoprolol (TOPROL-XL) 100 MG 24 hr tablet Take 100 mg by mouth daily.         No current facility-administered medications for this visit.    SURGICAL HISTORY:  Past Surgical History  Procedure Laterality Date  . Tonsillectomy  50  . Kidney stones  [age]  . Cyst of  left breast and right breast      Dr. Irena Reichmann   . Abdominal hysterectomy  1990    Dr. Colon Branch   . Dilation and curettage of uterus    . Lung cancer surgery  10/01/11  DR.BURNEY    (L)VATS,ANT. MINI THORACOTOMY, WEDGE RESECTION OF LULOBE LESION WITH NODWE SAMPLING    REVIEW OF SYSTEMS:  A comprehensive review of systems was negative except for: Respiratory: positive for dyspnea on exertion   PHYSICAL  EXAMINATION: General appearance: alert, cooperative and no distress Head: Normocephalic, without obvious abnormality, atraumatic Neck: no adenopathy Lymph nodes: Cervical, supraclavicular, and axillary nodes normal. Resp: clear to auscultation bilaterally Cardio: regular rate and rhythm, S1, S2 normal, no murmur, click, rub or gallop GI: soft, non-tender; bowel sounds normal; no masses,  no organomegaly Extremities: extremities normal, atraumatic, no cyanosis or edema  ECOG PERFORMANCE STATUS: 1 - Symptomatic but completely ambulatory  Blood pressure 144/70, pulse 82, temperature 97.8 F (36.6 C), temperature source Oral, resp. rate 18, height 5\' 11"  (1.803 m), weight 279 lb 11.2 oz (126.871 kg).  LABORATORY DATA: Lab Results  Component Value Date   WBC 6.9 04/24/2013   HGB 14.4 04/24/2013   HCT 42.7 04/24/2013   MCV 83.4 04/24/2013   PLT 198 04/24/2013      Chemistry      Component Value Date/Time   NA 142 04/24/2013 0959   NA 143 12/08/2012   NA 140 03/13/2012 0405   K 4.3 04/24/2013 0959   K 3.2* 03/13/2012 0405   CL 101 04/24/2013 0959   CL 97 03/13/2012 0405   CO2 31* 04/24/2013 0959   CO2 35* 03/13/2012 0405   BUN 21.6 04/24/2013 0959   BUN 17 12/08/2012   BUN 27* 03/13/2012 0405   CREATININE 0.8 04/24/2013 0959   CREATININE 0.7 12/08/2012   CREATININE 0.64 03/13/2012 0405   GLU 97 12/08/2012      Component Value Date/Time   CALCIUM 9.8 04/24/2013 0959   CALCIUM 8.6 03/13/2012 0405   ALKPHOS 52 04/24/2013 0959   ALKPHOS 54 03/09/2012 0635   AST 19 04/24/2013 0959   AST 23 03/09/2012 0635   ALT 14 04/24/2013 0959   ALT 14 03/09/2012 0635   BILITOT 0.72 04/24/2013 0959   BILITOT 0.2* 03/09/2012 0635       RADIOGRAPHIC STUDIES: Ct Chest Wo Contrast  04/24/2013  *RADIOLOGY REPORT*  Clinical Data: Lung cancer.  Left upper lobe wedge resection. Shortness of breath.  CT CHEST WITHOUT CONTRAST  Technique:  Multidetector CT imaging of the chest was performed following the standard  protocol without IV contrast.  Comparison: 10/24/2012  Findings: No pathologic thoracic adenopathy.  2.3 cm calcified gallstone.  Similar appearance of the left kidney upper pole cysts. Atherosclerosis appears stable.  Left upper lobectomy noted with residual scarring.  Ground-glass opacity in the superior segment left lower lobe, 1.2 x 0.8 cm (formerly 0.9 x 0.7 cm).  Ground-glass nodule in  the superior segment right lower lobe measures 1.8 x 1.2 cm (formerly 1.6 x 1.1 cm).  The more solid appearing lesion in the right upper lobe measures 1.4 by 1.0 cm (formerly 1.2 x 0.9 cm by my measurements).  Several other smaller ground-glass opacities are present in the right upper lobe, without definite new nodules identified.  IMPRESSION:  1.  Mild enlargement of bilateral nodules, most of which is are ground-glass. The progressive enlargement over time is even more apparent when one compares back several years.  A more solid appearing lesion in the right upper lobe is also mildly enlarged. Appearance favors multifocal low grade adenocarcinoma. 2.  Cholelithiasis.   Original Report Authenticated By: Gaylyn Rong, M.D.     ASSESSMENT AND PLAN:  1) stage IA non-small cell lung cancer status post left upper lobe wedge resection: No evidence for disease progression on his recent scan but the patient continues to have stable bilateral pulmonary nodules suspicious for low-grade adenocarcinoma. She'll continue on observation with repeat CT scan of the chest in 6 months. She was advised to call immediately she has any concerning symptoms in the interval. 2) history of hypertension, dyslipidemia and asthma: Continue current treatment as prescribed by your family doctor. All questions were answered. The patient knows to call the clinic with any problems, questions or concerns. We can certainly see the patient much sooner if necessary.

## 2013-04-26 NOTE — Telephone Encounter (Signed)
Gv and printed appt sched and avs for pt for OCT...emaield Dr Arbutus Ped to change ct to w/o.Marland KitchenMarland Kitchen

## 2013-05-01 IMAGING — CR DG CHEST 2V
2 series · 2 of 2 positions shown · non-contrast
Comparison: 10/06/2011

CLINICAL DATA: Sided chest pain

CHEST - 2 VIEW

[view not recorded (1 of 2)]
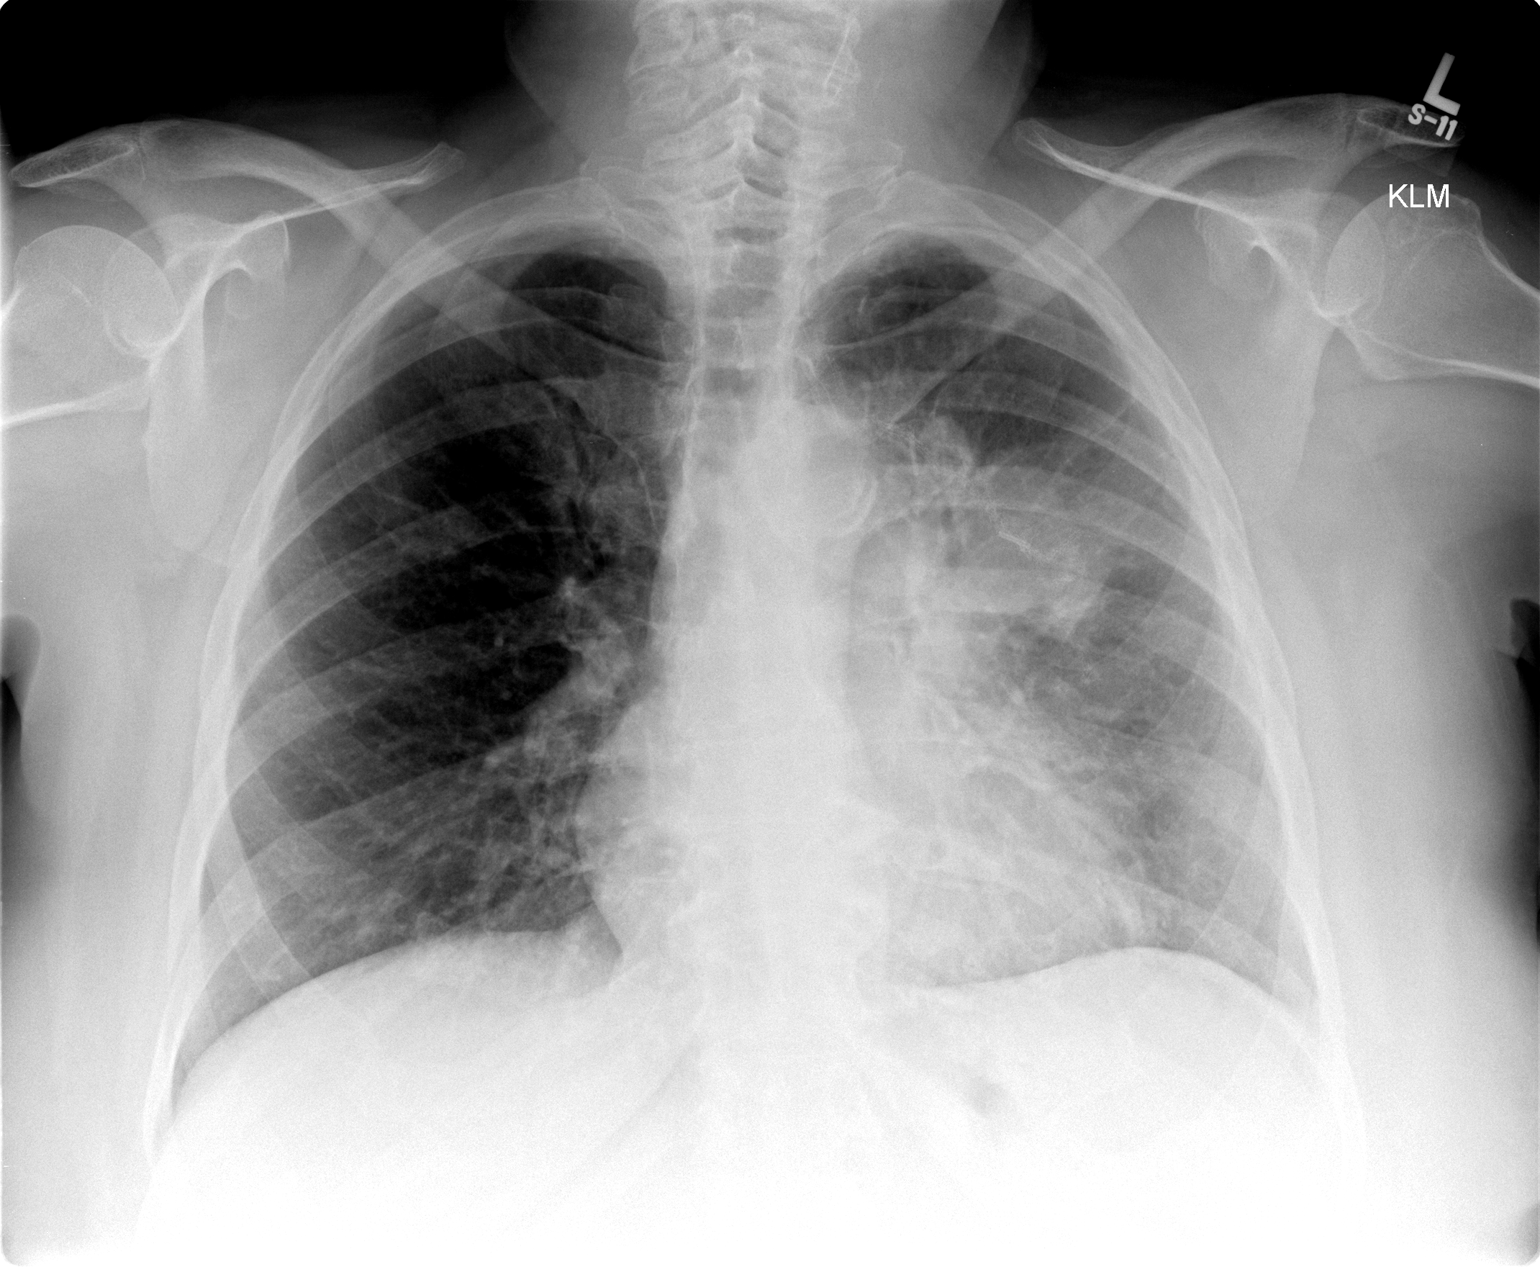

[view not recorded (2 of 2)]
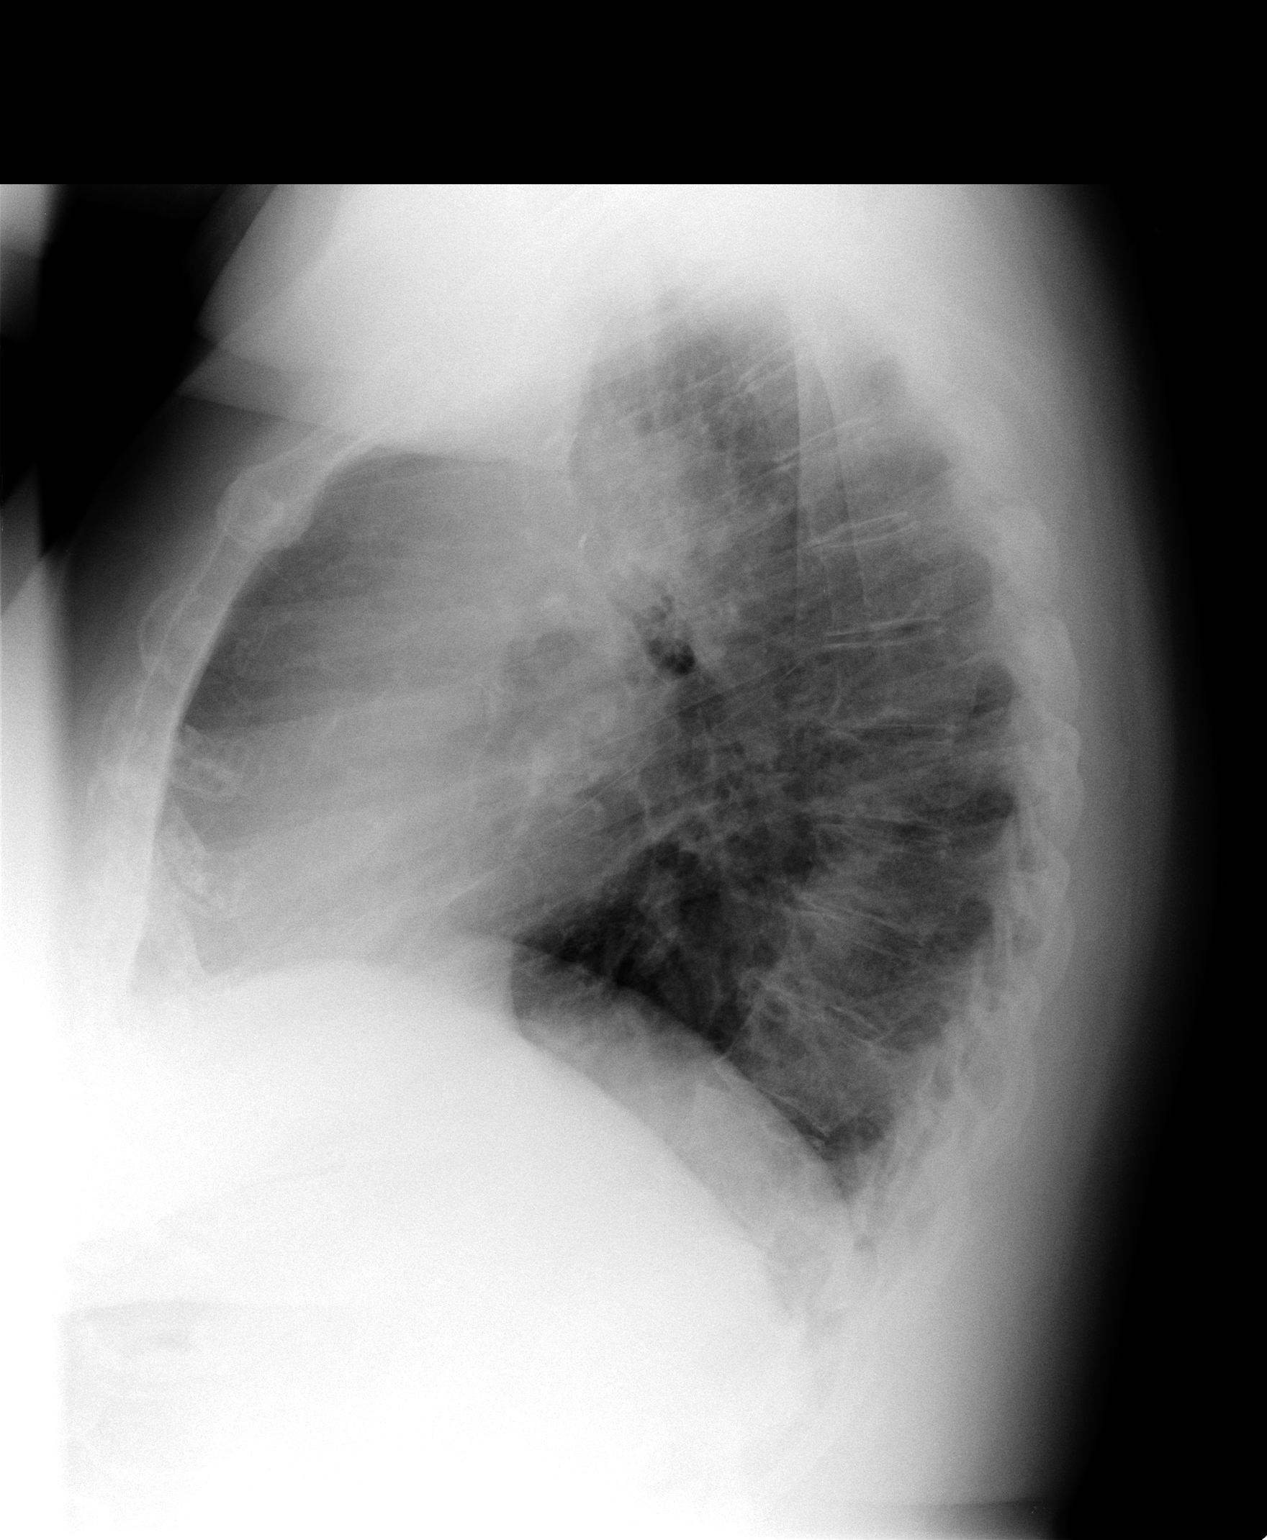

[2 of 2 positions shown; findings below may reference images not displayed]

FINDINGS: Left apical pneumothorax has resolved.  Central venous
catheter has been removed.  Right lung is clear.  Left suprahilar
opacity is smaller. No pleural effusion.  Left suprahilar air-fluid
level has resolved.
IMPRESSION: Overall there has been improvement.  Tiny left pneumothorax has
resolved.  Left suprahilar opacity is smaller.  Left suprahilar air-
fluid level has resolved.

## 2013-05-20 IMAGING — CR DG CHEST 2V
2 series · 2 of 2 positions shown · non-contrast
Comparison: 10/14/2011

CLINICAL DATA: Status post left lung surgery

CHEST - 2 VIEW

[w chest pa]
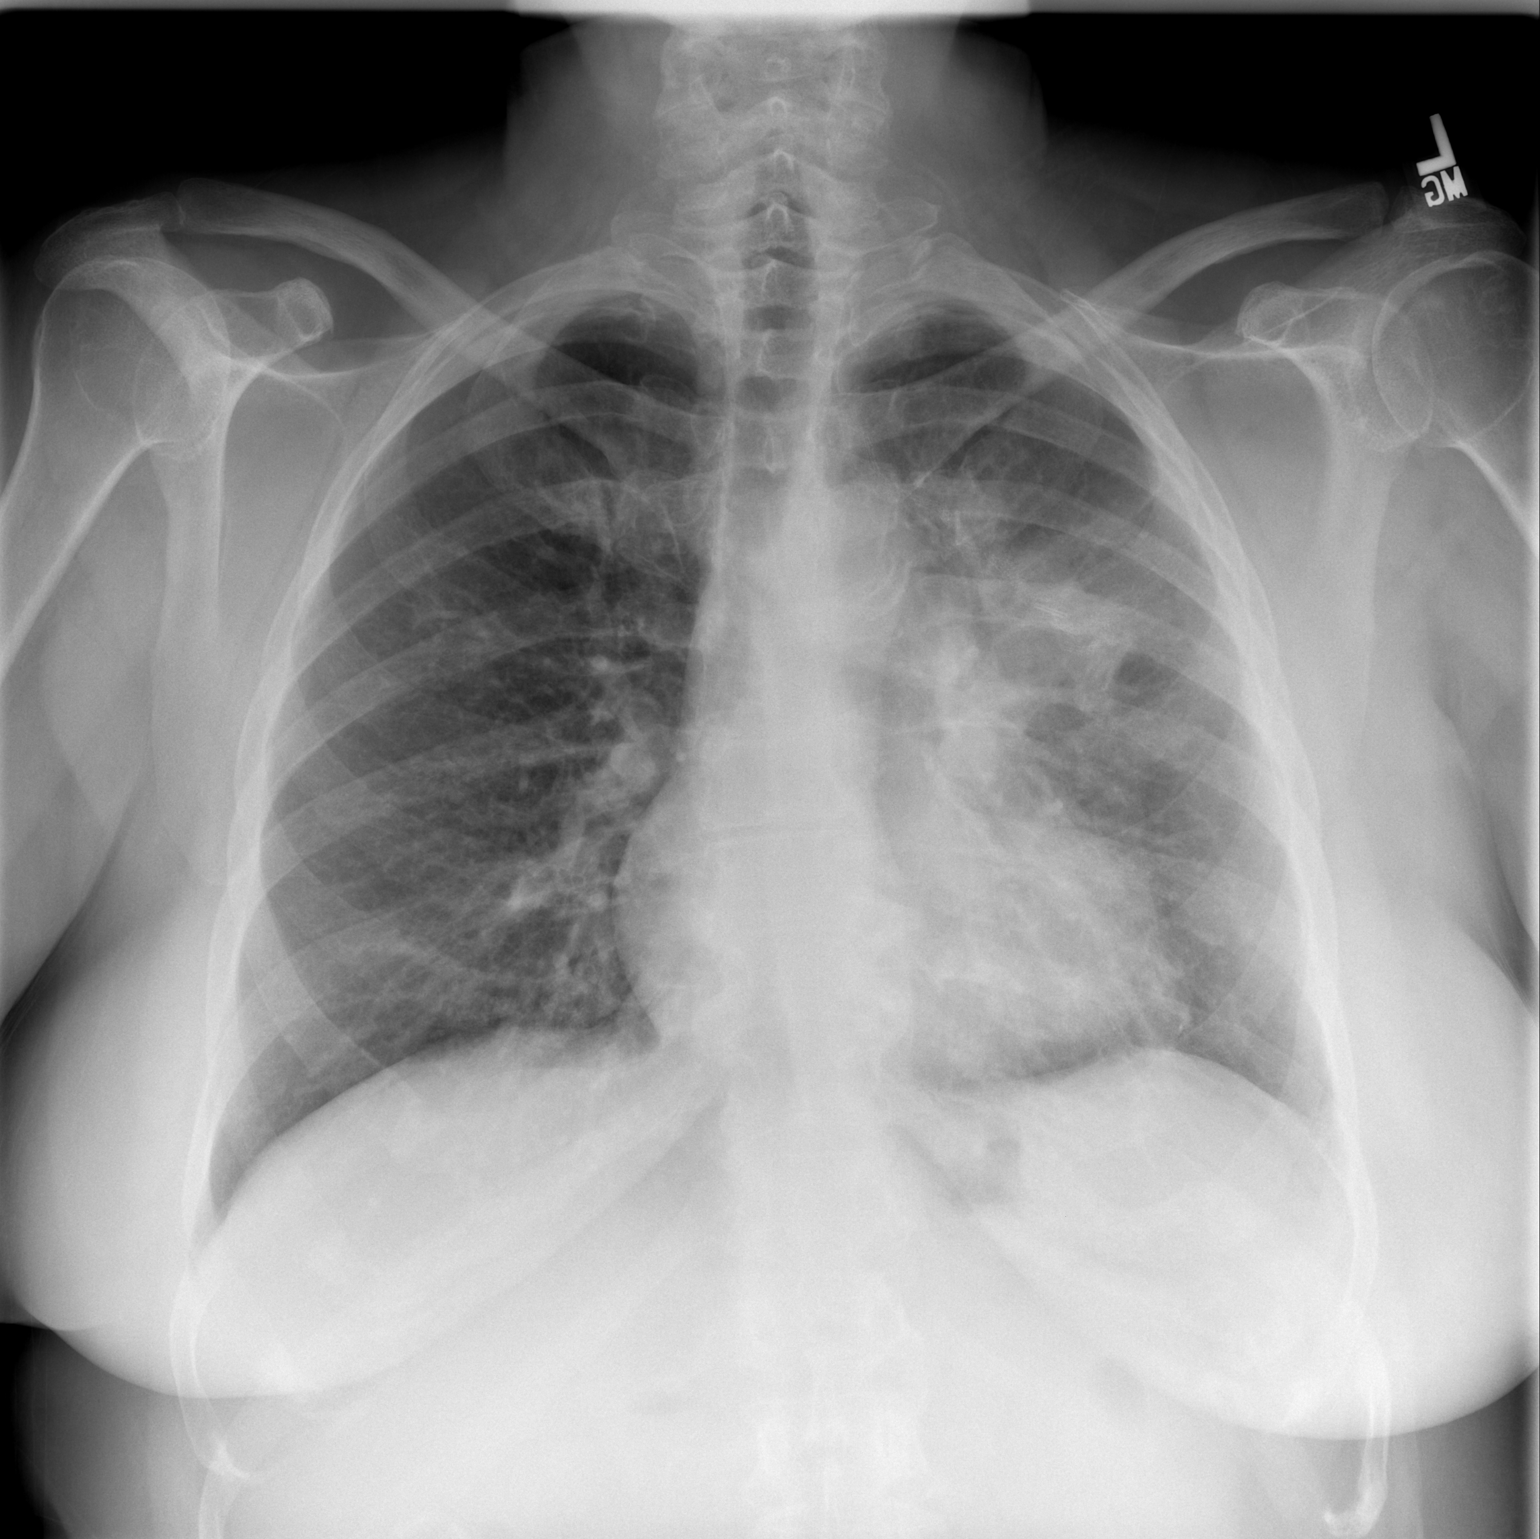

[w chest lat]
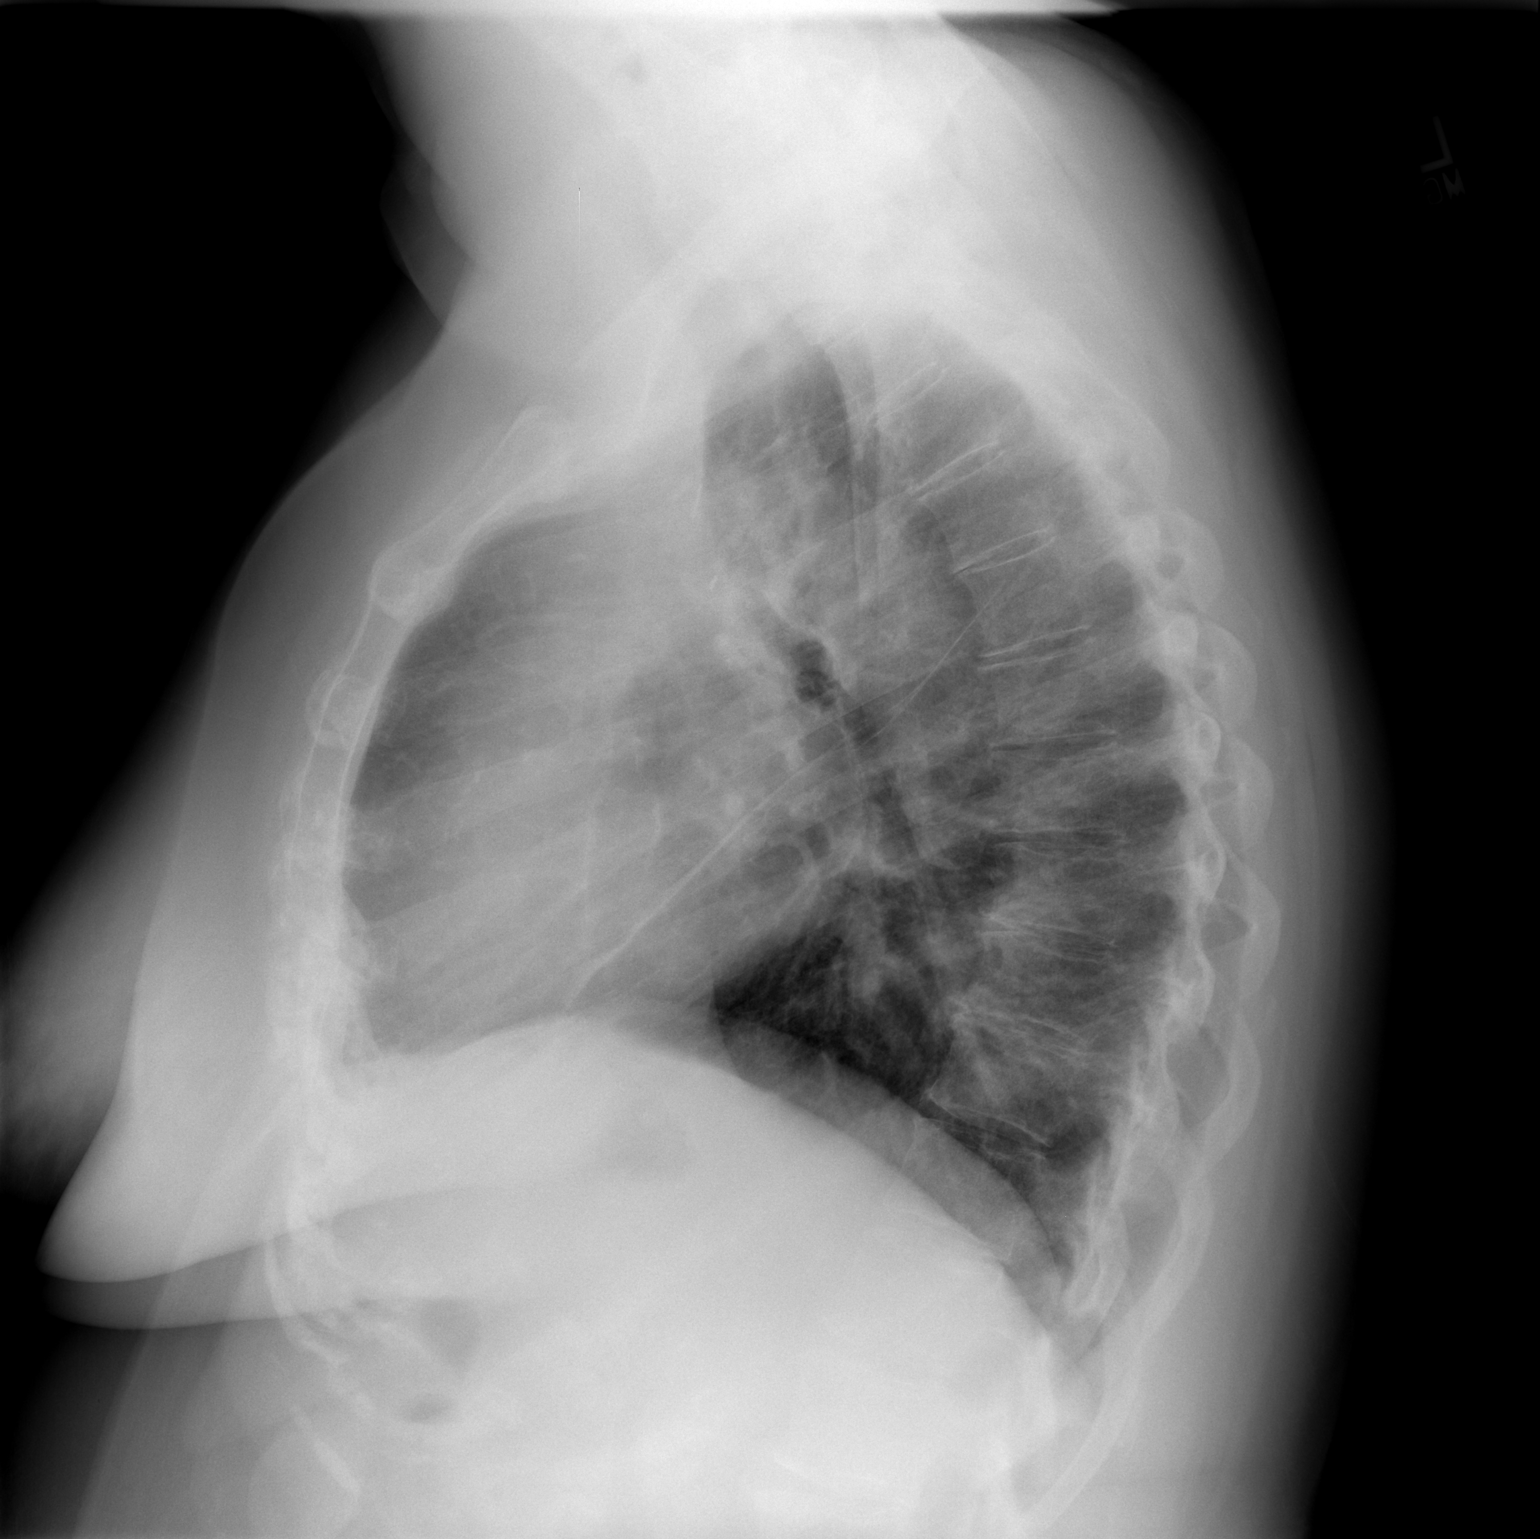

[2 of 2 positions shown; findings below may reference images not displayed]

FINDINGS: Postsurgical changes in the left upper lung.  Mild
residual left suprahilar opacity, decreased.  Right lung is
essentially clear.  No pleural effusion or pneumothorax.

Cardiomediastinal silhouette is within normal limits.

Mild degenerative changes of the visualized thoracolumbar spine.
IMPRESSION: Postictal changes in the left upper lung.  Mild residual left
suprahilar opacity, decreased.  No pneumothorax.

## 2013-05-25 ENCOUNTER — Telehealth: Payer: Self-pay | Admitting: Nurse Practitioner

## 2013-05-25 MED ORDER — NITROFURANTOIN MONOHYD MACRO 100 MG PO CAPS: 100.0000 mg | ORAL_CAPSULE | Freq: Two times a day (BID) | ORAL | Status: DC

## 2013-05-25 NOTE — Telephone Encounter (Signed)
macrobid rx called in to pharmacy

## 2013-05-25 NOTE — Telephone Encounter (Signed)
Patient notified

## 2013-05-25 NOTE — Telephone Encounter (Signed)
Patient says she has chronic kidney stones and UTIs. Started seeing blood in urine on Tuesday and today had some burning with urination. Wants to know if meds can be called in

## 2013-06-04 ENCOUNTER — Ambulatory Visit (INDEPENDENT_AMBULATORY_CARE_PROVIDER_SITE_OTHER): Payer: Medicare Other | Admitting: Pharmacist

## 2013-06-04 DIAGNOSIS — I4891 Unspecified atrial fibrillation: Secondary | ICD-10-CM

## 2013-06-04 NOTE — Patient Instructions (Signed)
Anticoagulation Dose Instructions as of 06/04/2013     Glynis Smiles Tue Wed Thu Fri Sat   New Dose 2.5 mg 2.5 mg 5 mg 2.5 mg 2.5 mg 2.5 mg 2.5 mg    Description       Take an extra 1/2 tablet today, then resume regular dose of 1/2 tablet daily except 1 tablet on tuesdays.        INR was 1.9 today

## 2013-07-01 IMAGING — CR DG CHEST 2V
2 series · 2 of 2 positions shown · non-contrast
Comparison: Chest x-ray of 11/02/2011

CLINICAL DATA: History lung carcinoma, surgery 6 weeks ago, follow-
up

CHEST - 2 VIEW

[w chest pa]
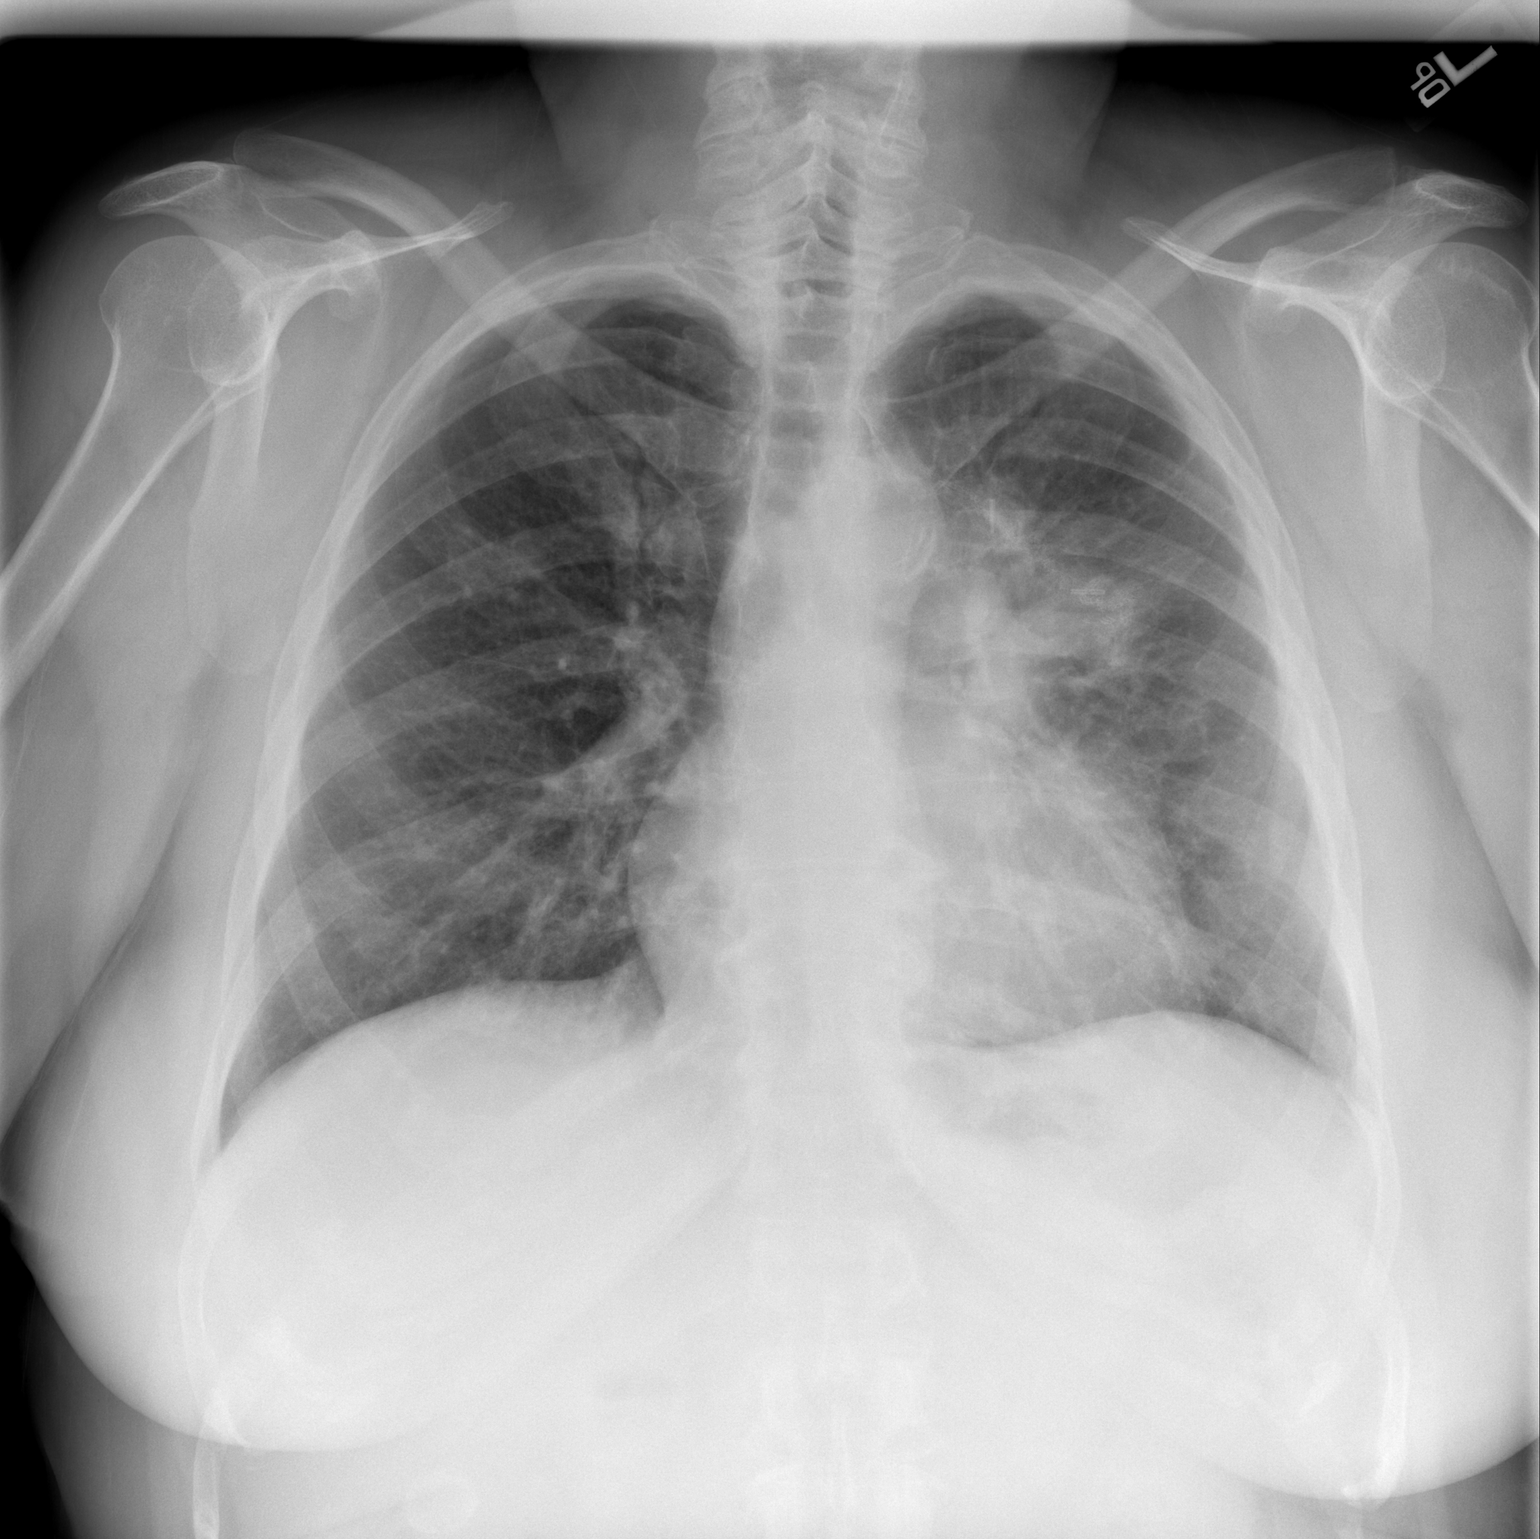

[w chest lat]
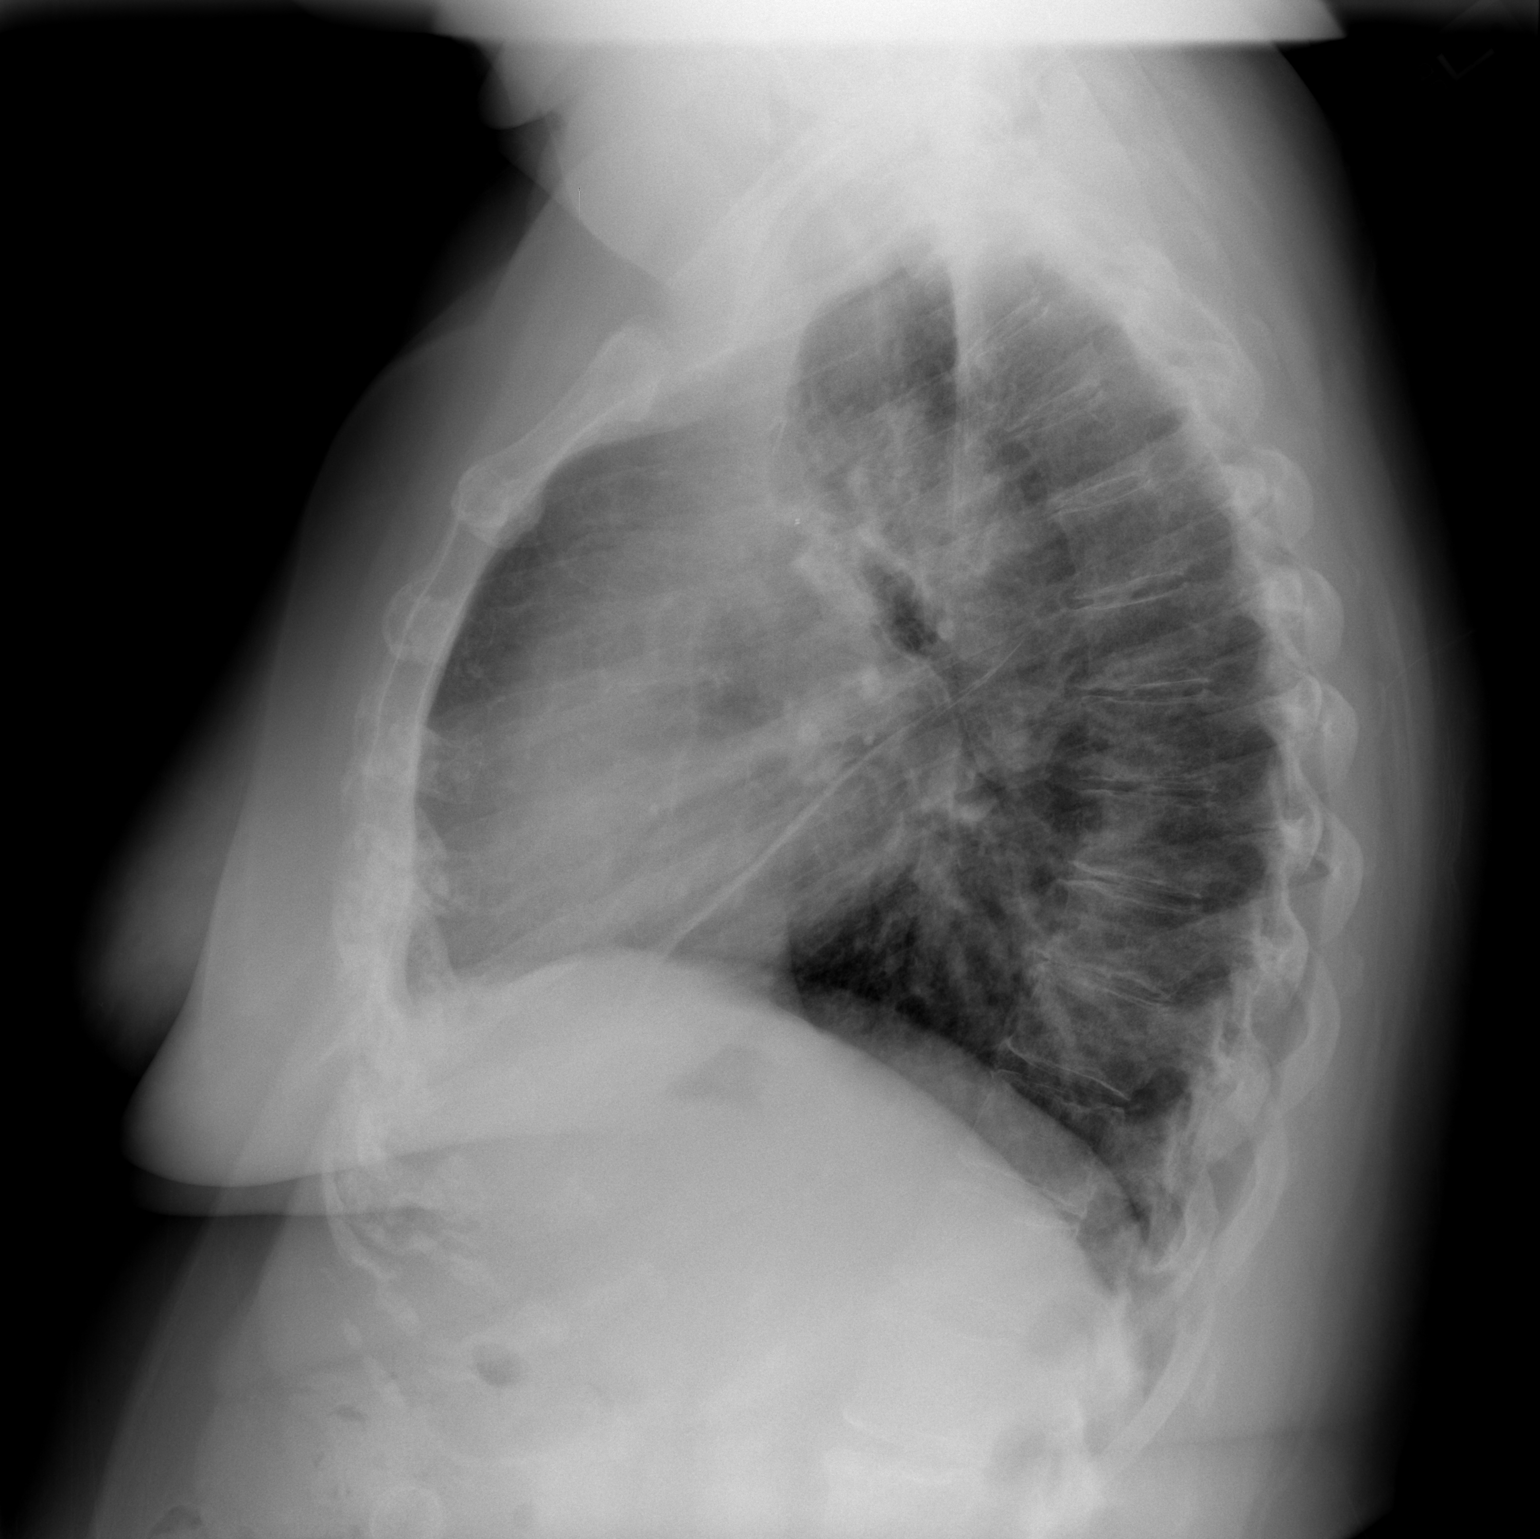

[2 of 2 positions shown; findings below may reference images not displayed]

FINDINGS: Aeration of the left lung has improved slightly.  Postop
opacity remains overlying the left suprahilar and perihilar region.
No pneumothorax is seen.  No effusion is noted.  The right lung is
clear.  Cardiomegaly is stable.  No acute bony abnormality is seen.
IMPRESSION: Slightly better aeration of the left lung.  Persistent postop
opacity overlying the left suprahilar and perihilar region.

## 2013-07-05 ENCOUNTER — Ambulatory Visit (INDEPENDENT_AMBULATORY_CARE_PROVIDER_SITE_OTHER): Payer: Medicare Other | Admitting: Pharmacist

## 2013-07-05 DIAGNOSIS — I4891 Unspecified atrial fibrillation: Secondary | ICD-10-CM

## 2013-07-05 LAB — POCT INR: INR: 3.4

## 2013-07-05 NOTE — Patient Instructions (Signed)
Anticoagulation Dose Instructions as of 07/05/2013     Tammie [name] Tue Wed Thu Fri Sat   New Dose 2.5 mg 2.5 mg 5 mg 2.5 mg 2.5 mg 2.5 mg 2.5 mg    Description       No warfarin today, then resume regular dose of 1/2 tablet daily except 1 tablet on tuesdays.        INR was 3.4 today

## 2013-07-06 ENCOUNTER — Encounter: Payer: Self-pay | Admitting: Physician Assistant

## 2013-07-06 ENCOUNTER — Ambulatory Visit (INDEPENDENT_AMBULATORY_CARE_PROVIDER_SITE_OTHER): Payer: Medicare Other | Admitting: Physician Assistant

## 2013-07-06 VITALS — BP 126/77 | HR 78 | Temp 98.1°F | Ht 71.0 in | Wt 281.0 lb

## 2013-07-06 DIAGNOSIS — D2370 Other benign neoplasm of skin of unspecified lower limb, including hip: Secondary | ICD-10-CM

## 2013-07-06 DIAGNOSIS — B369 Superficial mycosis, unspecified: Secondary | ICD-10-CM

## 2013-07-06 DIAGNOSIS — L538 Other specified erythematous conditions: Secondary | ICD-10-CM

## 2013-07-06 DIAGNOSIS — D2371 Other benign neoplasm of skin of right lower limb, including hip: Secondary | ICD-10-CM

## 2013-07-06 DIAGNOSIS — L304 Erythema intertrigo: Secondary | ICD-10-CM

## 2013-07-06 DIAGNOSIS — B49 Unspecified mycosis: Secondary | ICD-10-CM

## 2013-07-06 MED ORDER — NYSTATIN 100000 UNIT/GM EX POWD: CUTANEOUS | Status: DC

## 2013-07-06 MED ORDER — TALC EX POWD: CUTANEOUS | Status: DC | PRN

## 2013-07-08 NOTE — Progress Notes (Signed)
  Subjective:    Patient ID: Tammie [name], female    DOB: 1941-09-15, 72 y.o.   MRN: [id number]  HPI 72 y/o caucasian female presents for lesion on R ankle to r/o concerns of skin CA.    Review of Systems  Constitutional: Positive for diaphoresis (under bilateral breasts and abdominal folds). Negative for fever, chills and fatigue.  Skin: Positive for color change (darkened area on anterior surface of R ankle. ). Negative for pallor, rash and wound.       Objective:   Physical Exam  PE reveals what appears to be a benign dermatofibroma on anterior surface of R ankle. Scattered areas of textured actinic background changes were noted on BLE. Erythematous plaques with well marked borders, resembling candidias noted under bilateral breasts and abdominal folds. Areas are moist and nonTTP.        Assessment & Plan:  1. Dermatofibroma of R ankle: No treatment required. I explained to patient that this could be removed but usually recurs and may return larger since it is primarily due to scar tissue. Patient is agreeable with decision.   2. 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  3. Bacterial culture was taken to r/o secondary Intertrigo under breasts and abdominal fold. We will await pathology results and treat accordingly. I feel that this is primarily a fungal infection due to candidiasis so I will treat with Nystatin powders, along with OTC Zeosorb. I explained to patient the importance of keeping area try for optimal healing. Suggested drying area with a hair dryer after bathing. When area is completely dry, apply both powders to area. Zeosorb will aid in moisture prevention and nystatin will hopefully treat for yeast. Patient is not able to take Diflucan due to DI with Warfarin.   Patient will RTC if symptoms do not improve. Await path on bacterial culture and treat with antibiotic if needed.

## 2013-07-09 LAB — WOUND CULTURE

## 2013-07-14 ENCOUNTER — Other Ambulatory Visit: Payer: Self-pay | Admitting: Physician Assistant

## 2013-07-14 DIAGNOSIS — A4901 Methicillin susceptible Staphylococcus aureus infection, unspecified site: Secondary | ICD-10-CM

## 2013-07-14 MED ORDER — CEPHALEXIN 500 MG PO CAPS: 500.0000 mg | ORAL_CAPSULE | Freq: Two times a day (BID) | ORAL | Status: DC

## 2013-08-09 ENCOUNTER — Ambulatory Visit (INDEPENDENT_AMBULATORY_CARE_PROVIDER_SITE_OTHER): Payer: Medicare Other | Admitting: Pharmacist

## 2013-08-09 ENCOUNTER — Other Ambulatory Visit: Payer: Self-pay | Admitting: Pharmacist

## 2013-08-09 DIAGNOSIS — I4891 Unspecified atrial fibrillation: Secondary | ICD-10-CM

## 2013-08-09 LAB — POCT INR: INR: 4.2

## 2013-08-09 NOTE — Patient Instructions (Signed)
Anticoagulation Dose Instructions as of 08/09/2013     Tammie [name] Tue Wed Thu Fri Sat   New Dose 2.5 mg 2.5 mg 2.5 mg 2.5 mg 2.5 mg 2.5 mg 2.5 mg    Description       No warfarin for 2 days,  Decrease dose to 1/2 tablet daily.       INR was 4.2 today

## 2013-08-10 MED ORDER — SIMVASTATIN 10 MG PO TABS: 10.0000 mg | ORAL_TABLET | Freq: Every day | ORAL | Status: DC

## 2013-08-10 MED ORDER — WARFARIN SODIUM 5 MG PO TABS: 2.5000 mg | ORAL_TABLET | ORAL | Status: DC

## 2013-08-10 MED ORDER — METOPROLOL TARTRATE 100 MG PO TABS: 100.0000 mg | ORAL_TABLET | Freq: Two times a day (BID) | ORAL | Status: DC

## 2013-08-10 MED ORDER — HYDROCHLOROTHIAZIDE 12.5 MG PO CAPS: 25.0000 mg | ORAL_CAPSULE | Freq: Every day | ORAL | Status: DC

## 2013-08-14 ENCOUNTER — Other Ambulatory Visit: Payer: Self-pay | Admitting: *Deleted

## 2013-08-14 MED ORDER — ALBUTEROL SULFATE HFA 108 (90 BASE) MCG/ACT IN AERS: INHALATION_SPRAY | RESPIRATORY_TRACT | Status: DC

## 2013-08-23 ENCOUNTER — Ambulatory Visit (INDEPENDENT_AMBULATORY_CARE_PROVIDER_SITE_OTHER): Payer: Medicare Other | Admitting: General Practice

## 2013-08-23 ENCOUNTER — Encounter: Payer: Self-pay | Admitting: *Deleted

## 2013-08-23 VITALS — BP 148/79 | HR 70 | Temp 97.5°F | Ht 71.0 in | Wt 286.0 lb

## 2013-08-23 DIAGNOSIS — I4891 Unspecified atrial fibrillation: Secondary | ICD-10-CM

## 2013-08-23 DIAGNOSIS — R319 Hematuria, unspecified: Secondary | ICD-10-CM

## 2013-08-23 DIAGNOSIS — N390 Urinary tract infection, site not specified: Secondary | ICD-10-CM

## 2013-08-23 LAB — POCT URINALYSIS DIPSTICK
Bilirubin, UA: NEGATIVE
Ketones, UA: NEGATIVE
Leukocytes, UA: NEGATIVE

## 2013-08-23 LAB — POCT INR: INR: 2

## 2013-08-23 LAB — POCT UA - MICROSCOPIC ONLY: Crystals, Ur, HPF, POC: NEGATIVE

## 2013-08-23 LAB — POCT CBC
HCT, POC: 42.1 % (ref 37.7–47.9)
Hemoglobin: 14 g/dL (ref 12.2–16.2)
MCH, POC: 27.9 pg (ref 27–31.2)
MCHC: 33.1 g/dL (ref 31.8–35.4)
POC Granulocyte: 4 (ref 2–6.9)
POC LYMPH PERCENT: 27.2 %L (ref 10–50)
RDW, POC: 13.7 %
WBC: 5.8 10*3/uL (ref 4.6–10.2)

## 2013-08-23 MED ORDER — NITROFURANTOIN MONOHYD MACRO 100 MG PO CAPS: 100.0000 mg | ORAL_CAPSULE | Freq: Two times a day (BID) | ORAL | Status: DC

## 2013-08-23 NOTE — Progress Notes (Signed)
C/o blood in urine

## 2013-08-23 NOTE — Progress Notes (Signed)
  Subjective:    Patient ID: Tammie [name], female    DOB: 1941-08-13, 72 y.o.   MRN: [id number]  HPI Patient present today with complaints of blood in her urine. She reports the onset as 3 days ago and is gradually getting lighter. More pink tinged now. She denies taking OTC medications.     Review of Systems  Constitutional: Negative for fever and chills.  Respiratory: Negative for chest tightness and shortness of breath.   Cardiovascular: Negative for chest pain.  Genitourinary: Positive for dysuria, urgency, frequency and hematuria. Negative for difficulty urinating.  Neurological: Negative for dizziness, weakness and headaches.       Objective:   Physical Exam  Constitutional: She is oriented to person, place, and time. She appears well-developed and well-nourished.  Cardiovascular: Normal rate, regular rhythm and normal heart sounds.   Pulmonary/Chest: Effort normal and breath sounds normal.  Abdominal: Soft. Bowel sounds are normal. She exhibits no distension. There is no tenderness.  Neurological: She is alert and oriented to person, place, and time.  Skin: Skin is warm and dry.  Psychiatric: She has a normal mood and affect.          Assessment & Plan:

## 2013-08-23 NOTE — Patient Instructions (Signed)
Urinary Tract Infection  Urinary tract infections (UTIs) can develop anywhere along your urinary tract. Your urinary tract is your body's drainage system for removing wastes and extra water. Your urinary tract includes two kidneys, two ureters, a bladder, and a urethra. Your kidneys are a pair of bean-shaped organs. Each kidney is about the size of your fist. They are located below your ribs, one on each side of your spine.  CAUSES  Infections are caused by microbes, which are microscopic organisms, including fungi, viruses, and bacteria. These organisms are so small that they can only be seen through a microscope. Bacteria are the microbes that most commonly cause UTIs.  SYMPTOMS   Symptoms of UTIs may vary by age and gender of the patient and by the location of the infection. Symptoms in young women typically include a frequent and intense urge to urinate and a painful, burning feeling in the bladder or urethra during urination. Older women and men are more likely to be tired, shaky, and weak and have muscle aches and abdominal pain. A fever may mean the infection is in your kidneys. Other symptoms of a kidney infection include pain in your back or sides below the ribs, nausea, and vomiting.  DIAGNOSIS  To diagnose a UTI, your caregiver will ask you about your symptoms. Your caregiver also will ask to provide a urine sample. The urine sample will be tested for bacteria and white blood cells. White blood cells are made by your body to help fight infection.  TREATMENT   Typically, UTIs can be treated with medication. Because most UTIs are caused by a bacterial infection, they usually can be treated with the use of antibiotics. The choice of antibiotic and length of treatment depend on your symptoms and the type of bacteria causing your infection.  HOME CARE INSTRUCTIONS   If you were prescribed antibiotics, take them exactly as your caregiver instructs you. Finish the medication even if you feel better after you  have only taken some of the medication.   Drink enough water and fluids to keep your urine clear or pale yellow.   Avoid caffeine, tea, and carbonated beverages. They tend to irritate your bladder.   Empty your bladder often. Avoid holding urine for long periods of time.   Empty your bladder before and after sexual intercourse.   After a bowel movement, women should cleanse from front to back. Use each tissue only once.  SEEK MEDICAL CARE IF:    You have back pain.   You develop a fever.   Your symptoms do not begin to resolve within 3 days.  SEEK IMMEDIATE MEDICAL CARE IF:    You have severe back pain or lower abdominal pain.   You develop chills.   You have nausea or vomiting.   You have continued burning or discomfort with urination.  MAKE SURE YOU:    Understand these instructions.   Will watch your condition.   Will get help right away if you are not doing well or get worse.  Document Released: 09/22/2005 Document Revised: 06/13/2012 Document Reviewed: 01/21/2012  [employer] Patient Information 2014 [employer]

## 2013-08-25 LAB — URINE CULTURE

## 2013-09-17 ENCOUNTER — Ambulatory Visit (INDEPENDENT_AMBULATORY_CARE_PROVIDER_SITE_OTHER): Payer: Medicare Other | Admitting: Nurse Practitioner

## 2013-09-17 VITALS — BP 132/77 | HR 76 | Temp 97.9°F | Ht 71.0 in | Wt 281.0 lb

## 2013-09-17 DIAGNOSIS — N61 Mastitis without abscess: Secondary | ICD-10-CM

## 2013-09-17 MED ORDER — CIPROFLOXACIN HCL 500 MG PO TABS: 500.0000 mg | ORAL_TABLET | Freq: Two times a day (BID) | ORAL | Status: DC

## 2013-09-17 MED ORDER — CEFTRIAXONE SODIUM 1 G IJ SOLR
1.0000 g | INTRAMUSCULAR | Status: AC
Start: 2013-09-17 — End: 2013-09-17
  Administered 2013-09-17: 1 g via INTRAMUSCULAR

## 2013-09-17 NOTE — Progress Notes (Signed)
  Subjective:    Patient ID: Tammie [name], female    DOB: 1941-06-16, 72 y.o.   MRN: [id number]  HPI Patien tin c/o of cyst right breast- Started Saturday and now most of breast is red and sore touch- Scheduled for mammogram next week and needs resolved so can have mammogram. Patient has had this happened in the past.    Review of Systems  All other systems reviewed and are negative.       Objective:   Physical Exam  Constitutional: She appears well-developed and well-nourished.  Cardiovascular: Normal rate and normal heart sounds.   Pulmonary/Chest: Effort normal and breath sounds normal. Right breast exhibits skin change and tenderness. Right breast exhibits no inverted nipple, no mass and no nipple discharge. Left breast exhibits no inverted nipple, no mass, no nipple discharge, no skin change and no tenderness.    Erythematous indurated tender area right nipple and aerola- hot to touch    BP 132/77  Pulse 76  Temp(Src) 97.9 F (36.6 C) (Oral)  Ht 5\' 11"  (1.803 m)  Wt 281 lb (127.461 kg)  BMI 39.21 kg/m2       Assessment & Plan:  1. Mastitis Cool compresses Follow up if not improving If better okay to have mammogram in 1 week - cefTRIAXone (ROCEPHIN) injection 1 g; Inject 1 g into the muscle now. - ciprofloxacin (CIPRO) 500 MG tablet; Take 1 tablet (500 mg total) by mouth 2 (two) times daily.  Dispense: 20 tablet; Refill: 0  Mary-Margaret Daphine Deutscher, FNP

## 2013-09-17 NOTE — Patient Instructions (Signed)
Cellulitis Cellulitis is an infection of the skin and the tissue beneath it. The infected area is usually red and tender. Cellulitis occurs most often in the arms and lower legs.  CAUSES  Cellulitis is caused by bacteria that enter the skin through cracks or cuts in the skin. The most common types of bacteria that cause cellulitis are Staphylococcus and Streptococcus. SYMPTOMS   Redness and warmth.  Swelling.  Tenderness or pain.  Fever. DIAGNOSIS  Your caregiver can usually determine what is wrong based on a physical exam. Blood tests may also be done. TREATMENT  Treatment usually involves taking an antibiotic medicine. HOME CARE INSTRUCTIONS   Take your antibiotics as directed. Finish them even if you start to feel better.  Keep the infected arm or leg elevated to reduce swelling.  Apply a warm cloth to the affected area up to 4 times per day to relieve pain.  Only take over-the-counter or prescription medicines for pain, discomfort, or fever as directed by your caregiver.  Keep all follow-up appointments as directed by your caregiver. SEEK MEDICAL CARE IF:   You notice red streaks coming from the infected area.  Your red area gets larger or turns dark in color.  Your bone or joint underneath the infected area becomes painful after the skin has healed.  Your infection returns in the same area or another area.  You notice a swollen bump in the infected area.  You develop new symptoms. SEEK IMMEDIATE MEDICAL CARE IF:   You have a fever.  You feel very sleepy.  You develop vomiting or diarrhea.  You have a general ill feeling (malaise) with muscle aches and pains. MAKE SURE YOU:   Understand these instructions.  Will watch your condition.  Will get help right away if you are not doing well or get worse. Document Released: 09/22/2005 Document Revised: 06/13/2012 Document Reviewed: 02/28/2012 [employer] Patient Information 2014 [employer].  

## 2013-09-19 ENCOUNTER — Telehealth: Payer: Self-pay | Admitting: Nurse Practitioner

## 2013-09-19 DIAGNOSIS — N61 Mastitis without abscess: Secondary | ICD-10-CM

## 2013-09-19 NOTE — Telephone Encounter (Signed)
Antibiotic

## 2013-09-20 MED ORDER — CIPROFLOXACIN HCL 500 MG PO TABS
500.0000 mg | ORAL_TABLET | Freq: Two times a day (BID) | ORAL | Status: DC
Start: 2013-09-20 — End: 2013-10-31

## 2013-09-20 NOTE — Telephone Encounter (Signed)
rx was actually sent her mail order pharacy the first time- corrected it and sent it to [facility]

## 2013-09-20 NOTE — Telephone Encounter (Signed)
Pt aware.

## 2013-09-24 ENCOUNTER — Other Ambulatory Visit (INDEPENDENT_AMBULATORY_CARE_PROVIDER_SITE_OTHER): Payer: Medicare Other

## 2013-09-24 ENCOUNTER — Ambulatory Visit (INDEPENDENT_AMBULATORY_CARE_PROVIDER_SITE_OTHER): Payer: Medicare Other | Admitting: Pharmacist

## 2013-09-24 DIAGNOSIS — I4891 Unspecified atrial fibrillation: Secondary | ICD-10-CM

## 2013-09-24 LAB — POCT INR: INR: 3.5

## 2013-09-24 IMAGING — CR DG CHEST 1V
1 series · 1 of 1 positions shown · non-contrast
Comparison: Two-view chest x-ray 12/14/2011, 11/02/2011, 10/14/2011
[HOSPITAL] and 10/06/2011 [HOSPITAL].

CLINICAL DATA: Shortness of breath.  Prior resection of a left
upper lobe adenocarcinoma in September 2011.

CHEST - 1 VIEW 03/08/2012:

[AP]
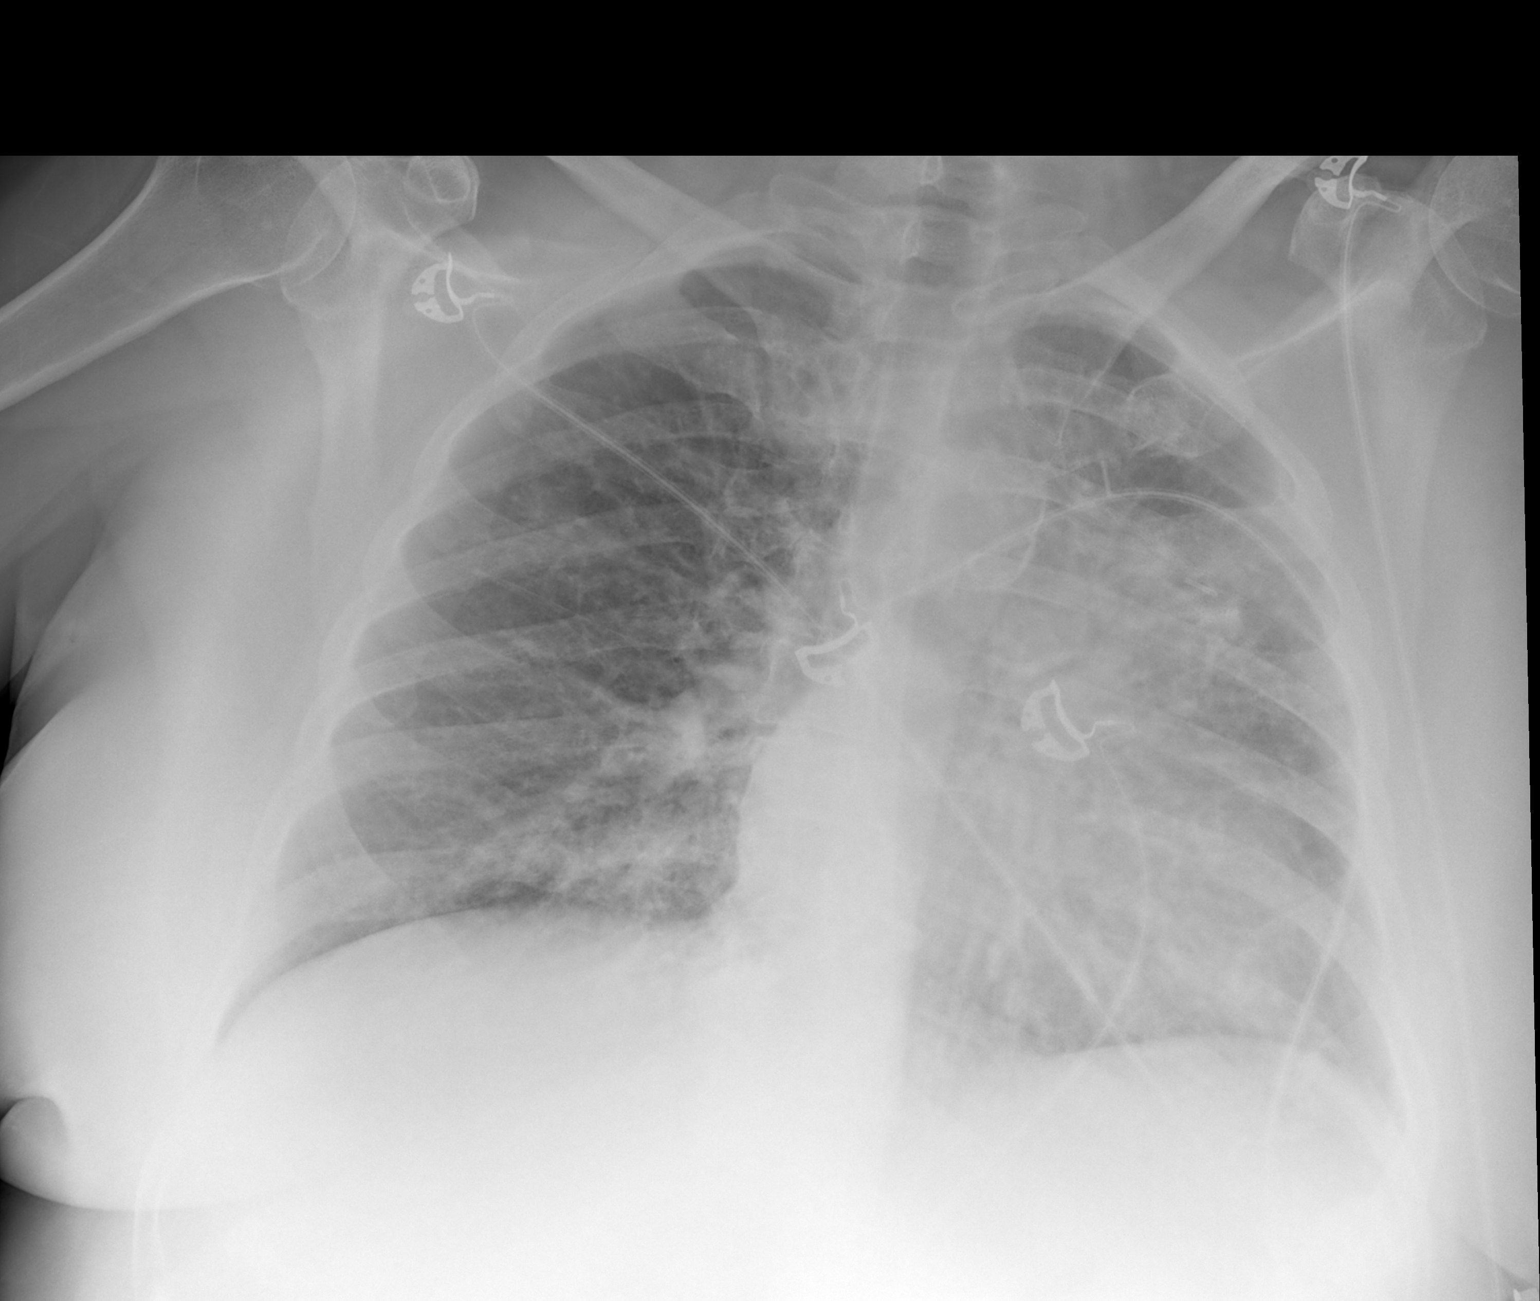

[1 of 1 positions shown; findings below may reference images not displayed]

FINDINGS: Cardiac silhouette upper normal in size to slightly
enlarged but stable.  Interval development of prominent
bronchovascular markings diffusely and marked central peribronchial
thickening with associated patchy airspace opacities in the medial
right lung base.  Post-surgical changes in the left upper lobe are
unchanged.
IMPRESSION: Pneumonia involving the right lung base, superimposed upon severe
changes of acute bronchitis and/or asthma.  Stable post-surgical
changes in the left upper lobe.

## 2013-09-24 NOTE — Patient Instructions (Signed)
Anticoagulation Dose Instructions as of 09/24/2013     Glynis Smiles Tue Wed Thu Fri Sat   New Dose 2.5 mg 2.5 mg 2.5 mg 2.5 mg 2.5 mg 2.5 mg 2.5 mg    Description       Hold warfarin today then restart 1/2 tablet daily.  Recheck protime in 10 - 14 days      INR was 3.5 today

## 2013-09-25 ENCOUNTER — Other Ambulatory Visit: Payer: Self-pay

## 2013-09-25 MED ORDER — METOPROLOL TARTRATE 100 MG PO TABS: 100.0000 mg | ORAL_TABLET | Freq: Two times a day (BID) | ORAL | Status: DC

## 2013-09-25 MED ORDER — HYDROCHLOROTHIAZIDE 12.5 MG PO CAPS: 25.0000 mg | ORAL_CAPSULE | Freq: Every day | ORAL | Status: DC

## 2013-09-25 MED ORDER — ALBUTEROL SULFATE HFA 108 (90 BASE) MCG/ACT IN AERS: INHALATION_SPRAY | RESPIRATORY_TRACT | Status: DC

## 2013-09-25 NOTE — Telephone Encounter (Signed)
Last seen 09/17/13  MMM   If approved print for mail order and route to nurse

## 2013-09-26 NOTE — Progress Notes (Signed)
Patient came in for labs only.

## 2013-09-27 IMAGING — CR DG CHEST 2V
2 series · 2 of 2 positions shown · non-contrast
Comparison: Chest radiograph 03/09/2011, 12/14/2011 CT 03/01/2011
PET CT 09/22/2011

CLINICAL DATA: Pneumonia

CHEST - 2 VIEW

[w chest pa]
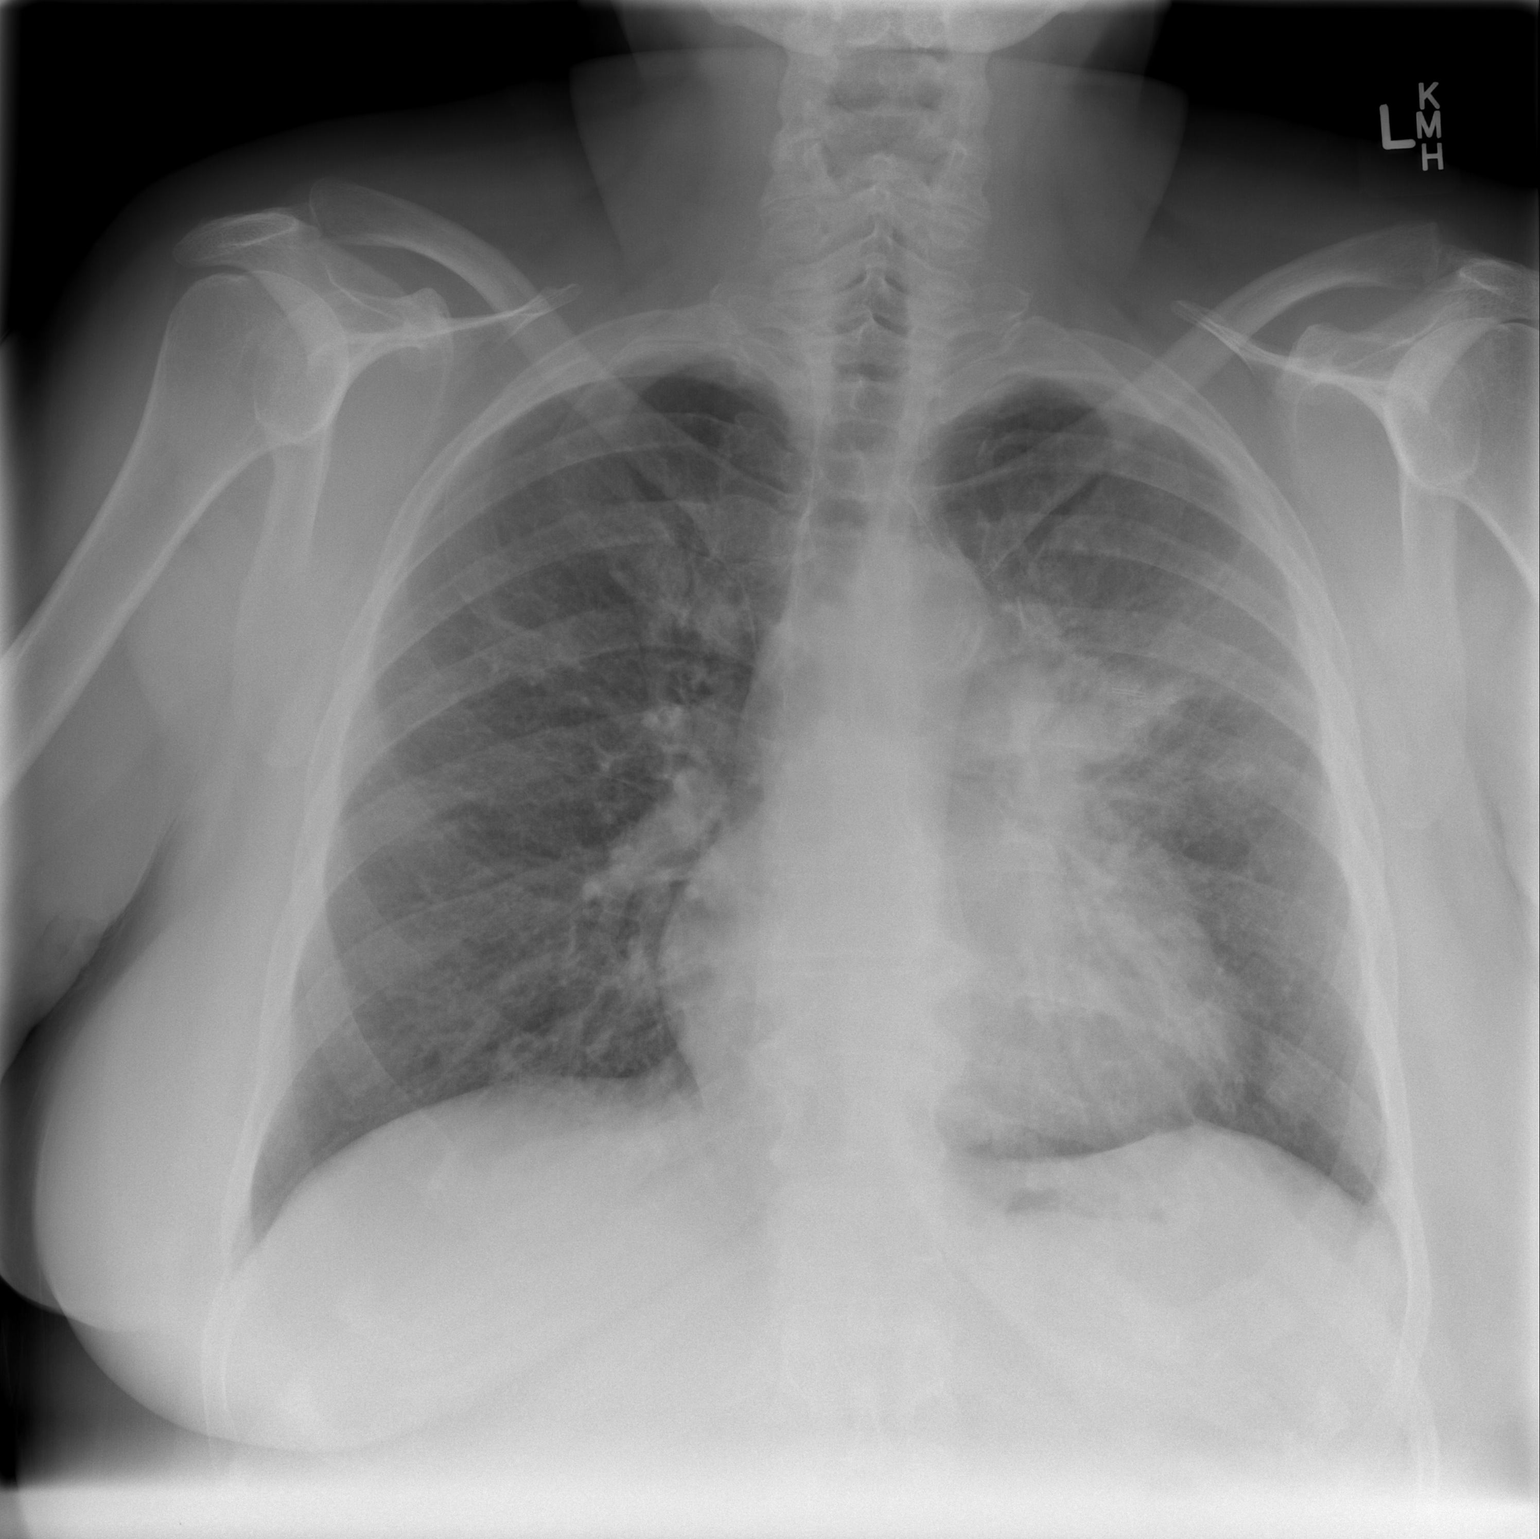

[w chest lat]
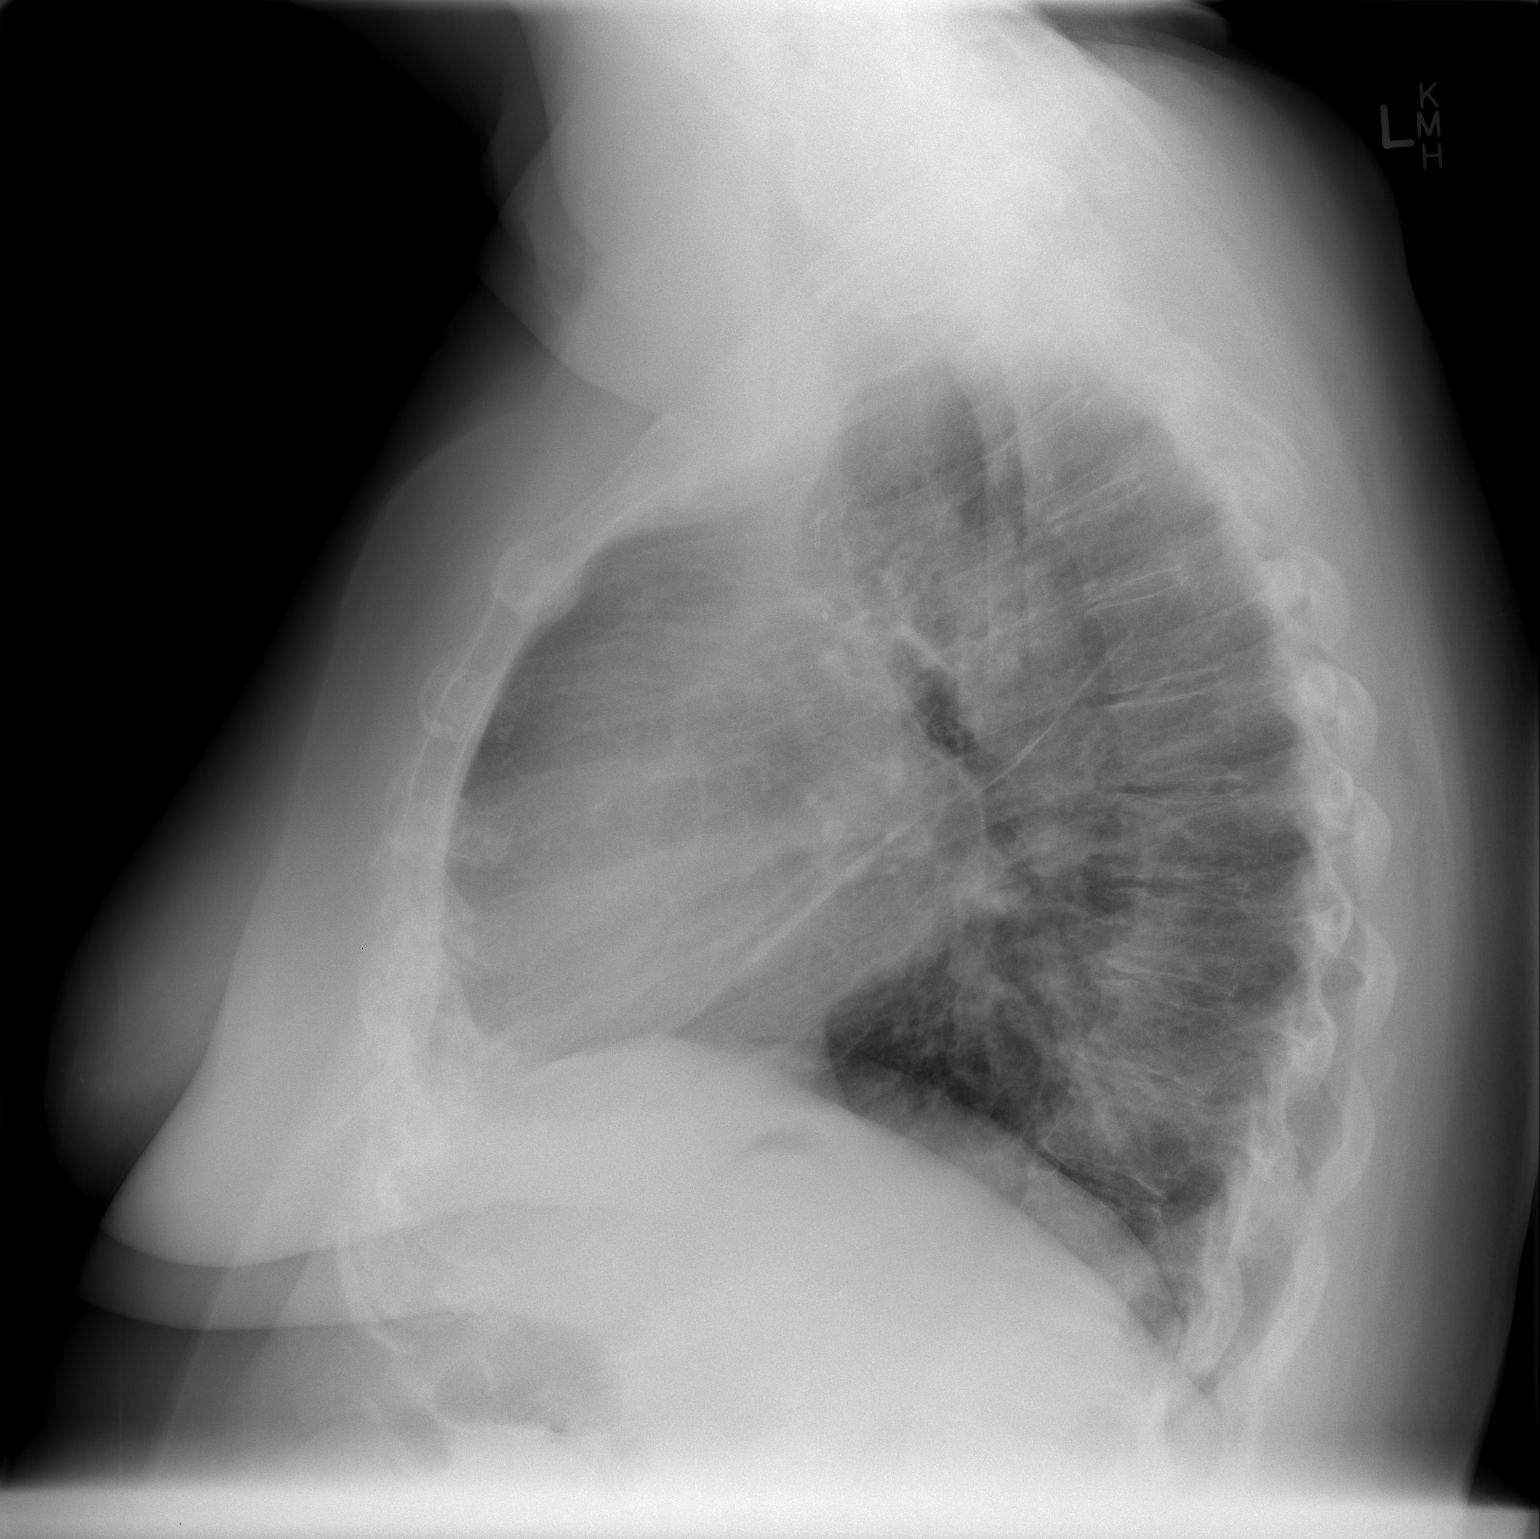

[2 of 2 positions shown; findings below may reference images not displayed]

FINDINGS: Stable cardiac silhouette.  There is a left suprahilar
density at site of the prior surgical resection which is similar to
12/14/2011.  There is a small left effusion.  No pneumothorax.  No
pulmonary edema. No acute osseous abnormality.
IMPRESSION: 1.  Left suprahilar opacity is not significantly changed from chest
radiograph 12/14/2011.  This is at site of prior lung surgery.]
2..  No evidence of right pneumonia.

## 2013-09-28 ENCOUNTER — Other Ambulatory Visit: Payer: Self-pay | Admitting: Nurse Practitioner

## 2013-09-28 DIAGNOSIS — R928 Other abnormal and inconclusive findings on diagnostic imaging of breast: Secondary | ICD-10-CM

## 2013-09-28 IMAGING — CT CT CHEST W/O CM
2 of 4 series · 15 of 36 positions shown, 18 images · non-contrast
Comparison: CT 09/06/2011, PET CT 09/22/2011

CLINICAL DATA: Lung cancer

CT CHEST WITHOUT CONTRAST
TECHNIQUE: Multidetector CT imaging of the chest was performed
following the standard protocol without IV contrast.

[Series 2: chest w/o st · axial · non-contrast · 0.71mm/px · z∈[-314,-44]mm · 12 of 64 slices shown, 15 images]
[im 5/64  mediastinal]
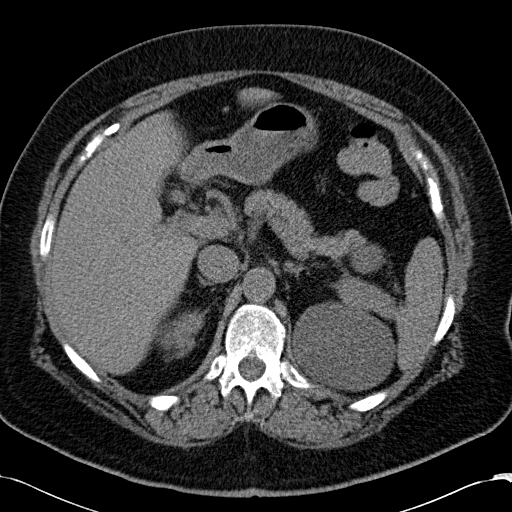
[im 5/64  lung]
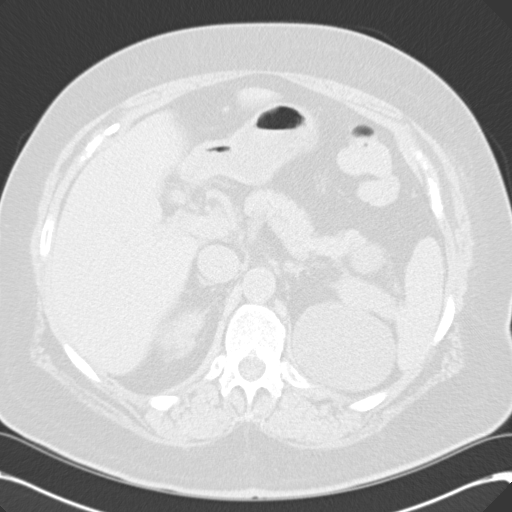
[im 10/64  lung]
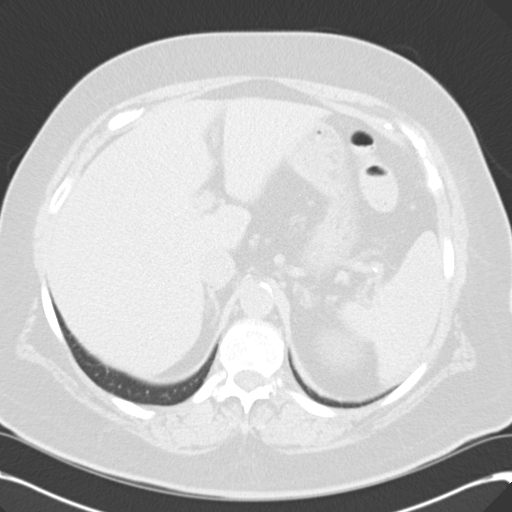
[im 15/64  lung]
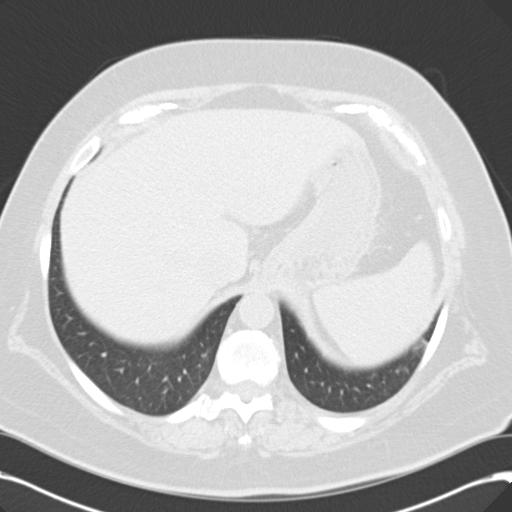
[im 20/64  lung]
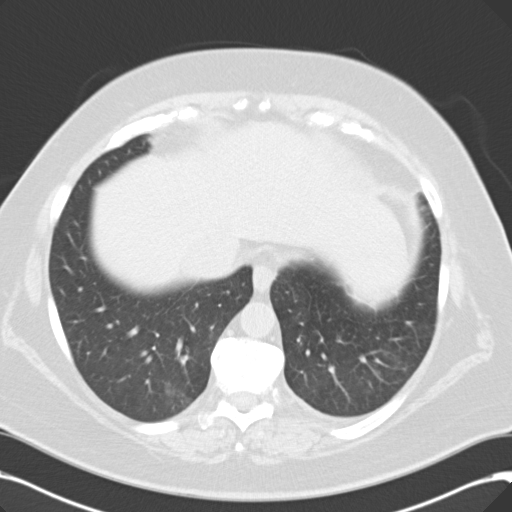
[im 25/64  mediastinal]
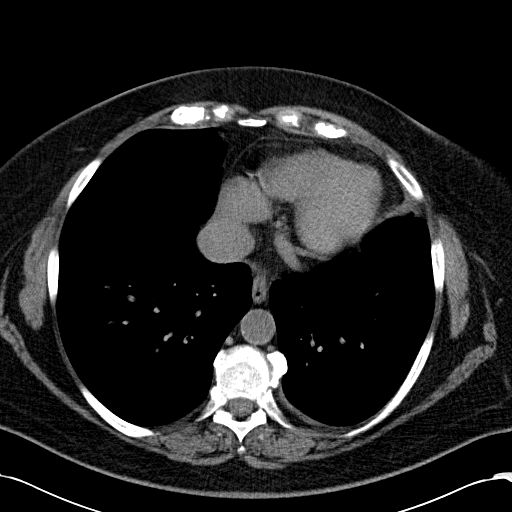
[im 25/64  lung]
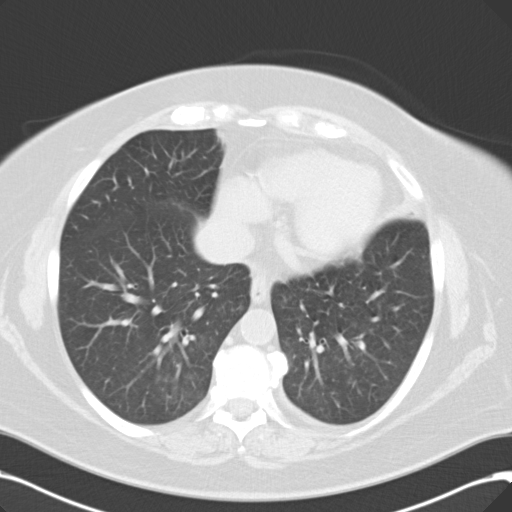
[im 30/64  lung]
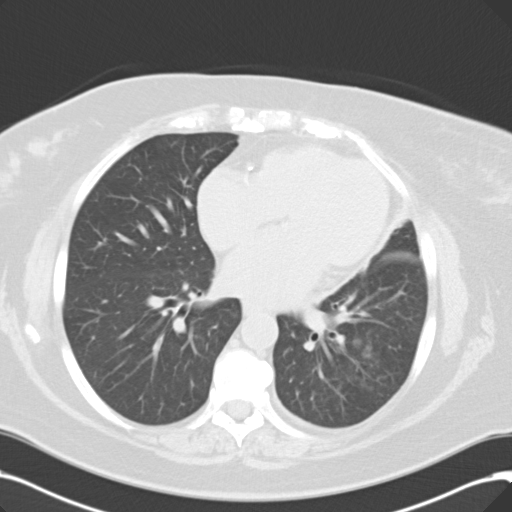
[im 34/64  lung]
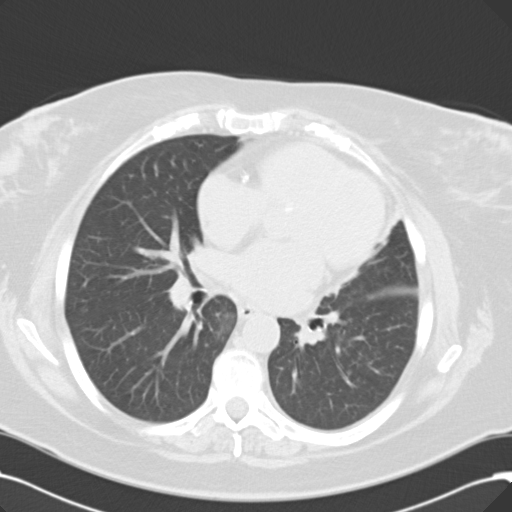
[im 39/64  lung]
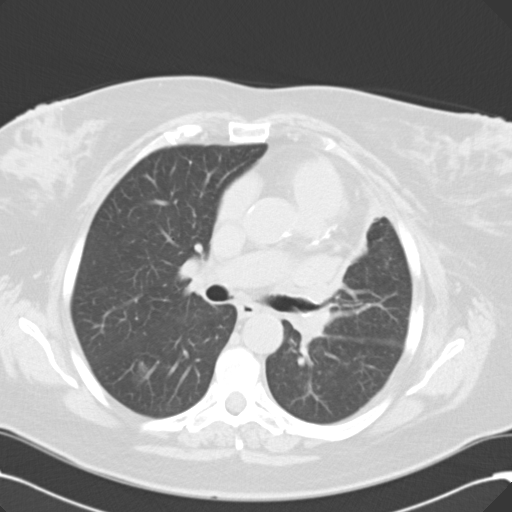
[im 44/64  mediastinal]
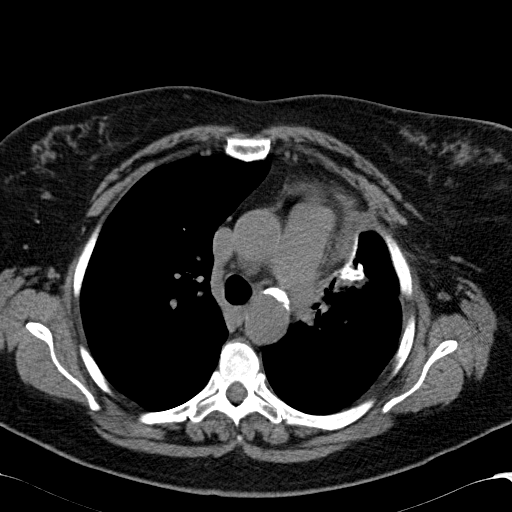
[im 44/64  lung]
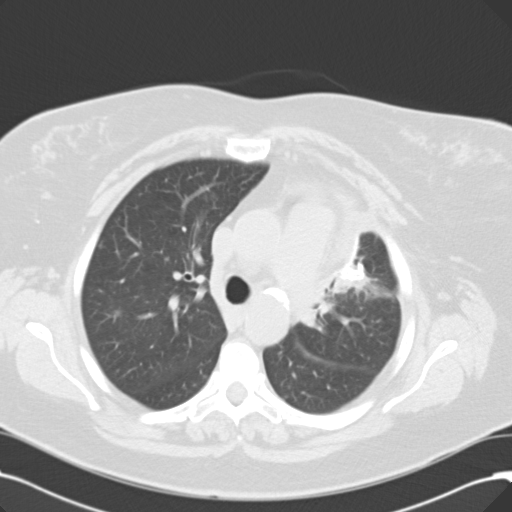
[im 49/64  lung]
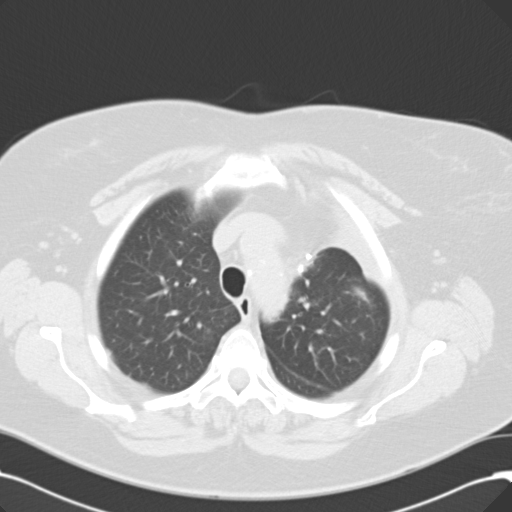
[im 54/64  lung]
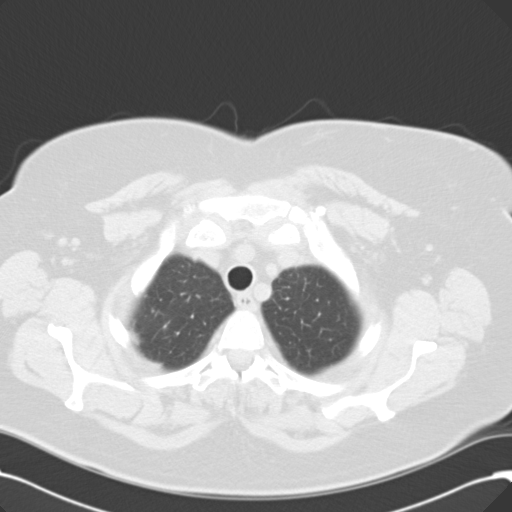
[im 59/64  lung]
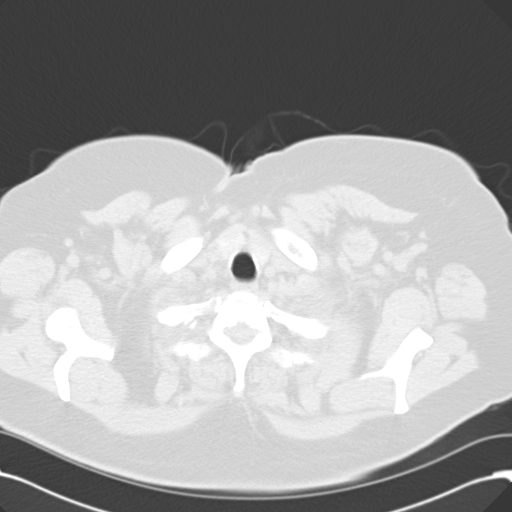

[Series 602: <mpr thick range> · coronal · 0.71mm/px · 3 of 104 slices shown]
[im 21/104  lung]
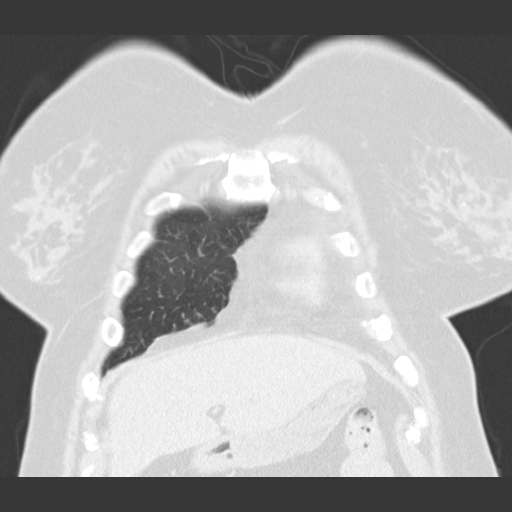
[im 42/104  lung]
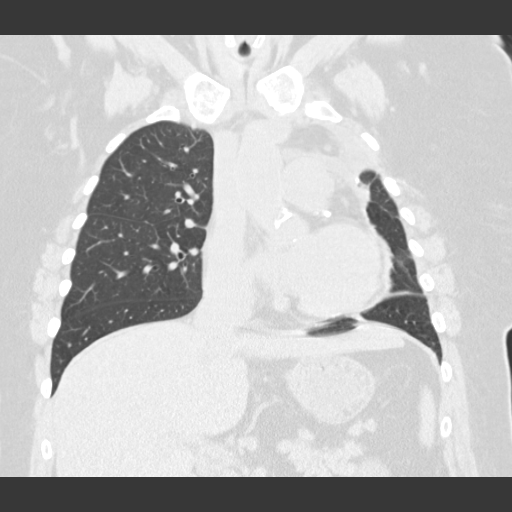
[im 62/104  lung]
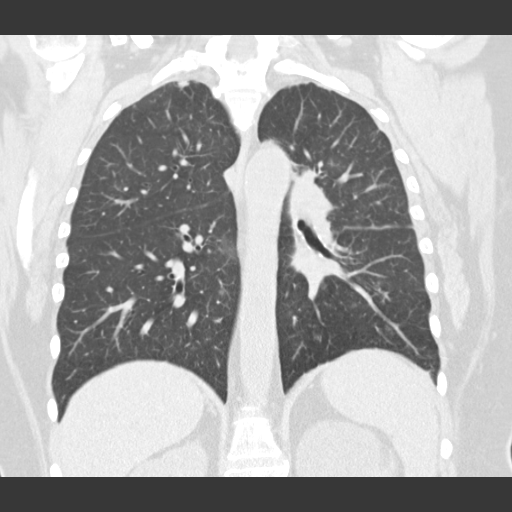

[15 of 36 positions shown; findings below may reference images not displayed]

FINDINGS: There is postsurgical change in the left upper lobe
following resection of left upper lobe nodule.  There is thickening
along the resection margin with no comparison for stability of
thickening.

 Ground-glass nodules in the left lower lobe (image 35) similar to
prior.  Ground-glass nodule in the right upper lobe measuring 10 mm
is not changed 10 mm on prior.  Ground-glass nodule in the right
lower lobe measures 16 x 11 mm similar to 18 x 13 mm on prior.

Ill-defined ground-glass opacities in the more inferior right lower
lobe (image 41) are increased compared to prior.  This could
represent early pneumonia.  Similar findings in the left lower lobe
(image 36).

No axillary or supraclavicular lymphadenopathy.  No mediastinal or
hilar lymphadenopathy.

Limited view upper abdomen shows a gallstone the gallbladder.
Adrenal glands are normal.  Simple cyst within the left kidney.

Review of the skeleton is unremarkable.
IMPRESSION: 1..  Interval increase in fine ground-glass opacities in both the
left and right lower lobe.  Differential includes low grade
adenocarcinoma versus pulmonary infection.
2.  Postsurgical change in the left upper lobe with nodularity.  No
comparison available.  This will serve as a baseline for follow up.
3.  Ground-glass nodules in the right upper lobe and superior
segment right lower lobe are unchanged.

## 2013-10-08 ENCOUNTER — Encounter (INDEPENDENT_AMBULATORY_CARE_PROVIDER_SITE_OTHER): Payer: Self-pay

## 2013-10-08 ENCOUNTER — Ambulatory Visit: Payer: Medicare Other | Admitting: Pharmacist

## 2013-10-08 ENCOUNTER — Other Ambulatory Visit (INDEPENDENT_AMBULATORY_CARE_PROVIDER_SITE_OTHER): Payer: Medicare Other

## 2013-10-08 ENCOUNTER — Telehealth: Payer: Self-pay | Admitting: Internal Medicine

## 2013-10-08 DIAGNOSIS — I4891 Unspecified atrial fibrillation: Secondary | ICD-10-CM

## 2013-10-08 LAB — POCT INR: INR: 3

## 2013-10-08 NOTE — Telephone Encounter (Signed)
lvm for pt regarding to appt d.t. change...mailed pt letter adn avs

## 2013-10-08 NOTE — Patient Instructions (Signed)
Anticoagulation Dose Instructions as of 10/08/2013     Tammie [name] Tue Wed Thu Fri Sat   New Dose 2.5 mg 2.5 mg 2.5 mg 2.5 mg 2.5 mg Hold 2.5 mg    Description       Recommend decrease to 1/2 tablet daily except none on Fridays.        INR was 3.0 today

## 2013-10-10 ENCOUNTER — Ambulatory Visit (HOSPITAL_COMMUNITY)
Admission: RE | Admit: 2013-10-10 | Discharge: 2013-10-10 | Disposition: A | Discharge status: Home / Self Care | Payer: Medicare Other | Source: Ambulatory Visit | Attending: Nurse Practitioner | Admitting: Nurse Practitioner

## 2013-10-10 ENCOUNTER — Other Ambulatory Visit (HOSPITAL_COMMUNITY): Payer: Self-pay | Admitting: Nurse Practitioner

## 2013-10-10 DIAGNOSIS — R928 Other abnormal and inconclusive findings on diagnostic imaging of breast: Secondary | ICD-10-CM

## 2013-10-10 DIAGNOSIS — N6009 Solitary cyst of unspecified breast: Secondary | ICD-10-CM | POA: Insufficient documentation

## 2013-10-22 ENCOUNTER — Other Ambulatory Visit (HOSPITAL_BASED_OUTPATIENT_CLINIC_OR_DEPARTMENT_OTHER): Payer: Medicare Other | Admitting: Lab

## 2013-10-22 ENCOUNTER — Ambulatory Visit (HOSPITAL_COMMUNITY)
Admission: RE | Admit: 2013-10-22 | Discharge: 2013-10-22 | Disposition: A | Discharge status: Home / Self Care | Payer: Medicare Other | Source: Ambulatory Visit | Attending: Internal Medicine | Admitting: Internal Medicine

## 2013-10-22 DIAGNOSIS — I2510 Atherosclerotic heart disease of native coronary artery without angina pectoris: Secondary | ICD-10-CM | POA: Insufficient documentation

## 2013-10-22 DIAGNOSIS — I7789 Other specified disorders of arteries and arterioles: Secondary | ICD-10-CM | POA: Insufficient documentation

## 2013-10-22 DIAGNOSIS — C3490 Malignant neoplasm of unspecified part of unspecified bronchus or lung: Secondary | ICD-10-CM | POA: Insufficient documentation

## 2013-10-22 DIAGNOSIS — N200 Calculus of kidney: Secondary | ICD-10-CM | POA: Insufficient documentation

## 2013-10-22 DIAGNOSIS — R918 Other nonspecific abnormal finding of lung field: Secondary | ICD-10-CM | POA: Insufficient documentation

## 2013-10-22 DIAGNOSIS — K8020 Calculus of gallbladder without cholecystitis without obstruction: Secondary | ICD-10-CM | POA: Insufficient documentation

## 2013-10-22 DIAGNOSIS — C3410 Malignant neoplasm of upper lobe, unspecified bronchus or lung: Secondary | ICD-10-CM

## 2013-10-22 LAB — COMPREHENSIVE METABOLIC PANEL (CC13)
BUN: 17.7 mg/dL (ref 7.0–26.0)
CO2: 26 mEq/L (ref 22–29)
Calcium: 9.7 mg/dL (ref 8.4–10.4)
Chloride: 106 mEq/L (ref 98–109)
Creatinine: 0.8 mg/dL (ref 0.6–1.1)
Glucose: 101 mg/dl (ref 70–140)
Total Bilirubin: 0.47 mg/dL (ref 0.20–1.20)

## 2013-10-22 LAB — CBC WITH DIFFERENTIAL/PLATELET
BASO%: 1 % (ref 0.0–2.0)
EOS%: 3.2 % (ref 0.0–7.0)
LYMPH%: 26.2 % (ref 14.0–49.7)
MCH: 28 pg (ref 25.1–34.0)
MCHC: 33.6 g/dL (ref 31.5–36.0)
MCV: 83.2 fL (ref 79.5–101.0)
MONO%: 7.2 % (ref 0.0–14.0)
Platelets: 187 10*3/uL (ref 145–400)
RBC: 5.25 10*6/uL (ref 3.70–5.45)
RDW: 13.1 % (ref 11.2–14.5)
WBC: 7 10*3/uL (ref 3.9–10.3)

## 2013-10-24 ENCOUNTER — Other Ambulatory Visit (HOSPITAL_COMMUNITY): Payer: Medicare Other

## 2013-10-24 ENCOUNTER — Ambulatory Visit: Payer: Medicare Other | Admitting: Internal Medicine

## 2013-10-29 ENCOUNTER — Ambulatory Visit (INDEPENDENT_AMBULATORY_CARE_PROVIDER_SITE_OTHER): Payer: Medicare Other | Admitting: Pharmacist

## 2013-10-29 DIAGNOSIS — I4891 Unspecified atrial fibrillation: Secondary | ICD-10-CM

## 2013-10-29 DIAGNOSIS — Z23 Encounter for immunization: Secondary | ICD-10-CM

## 2013-10-29 LAB — POCT INR: INR: 3.1

## 2013-10-29 NOTE — Patient Instructions (Signed)
Anticoagulation Dose Instructions as of 10/29/2013     Glynis Smiles Tue Wed Thu Fri Sat   New Dose 2.5 mg Hold 2.5 mg 2.5 mg 2.5 mg 2.5 mg 2.5 mg    Description       Recommend decrease to 1/2 tablet daily except none on Monday.       INR was 3.1 today

## 2013-10-31 ENCOUNTER — Telehealth: Payer: Self-pay | Admitting: Internal Medicine

## 2013-10-31 ENCOUNTER — Encounter: Payer: Self-pay | Admitting: Internal Medicine

## 2013-10-31 ENCOUNTER — Ambulatory Visit (HOSPITAL_BASED_OUTPATIENT_CLINIC_OR_DEPARTMENT_OTHER): Payer: Medicare Other | Admitting: Internal Medicine

## 2013-10-31 DIAGNOSIS — C3410 Malignant neoplasm of upper lobe, unspecified bronchus or lung: Secondary | ICD-10-CM

## 2013-10-31 NOTE — Patient Instructions (Signed)
Followup visit in 6 months with repeat CT scan of the chest. 

## 2013-10-31 NOTE — Telephone Encounter (Signed)
lvm for pt regarding to May 2015 appt....mailed pt appt sched and letter

## 2013-10-31 NOTE — Progress Notes (Signed)
[facility] [phone]   [phone]  OFFICE PROGRESS NOTE  Rudi Heap, MD [address] 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 The patient also has bilateral groundglass opacities suspicious for low-grade adenocarcinoma.   PRIOR THERAPY: Status post left VATS with wedge resection of the left upper lobe lesion and node sampling under the care of Dr. Edwyna Shell on 10/01/2011   CURRENT THERAPY: Observation.  INTERVAL HISTORY: Tammie [name] 72 y.o. female returns to the clinic today for routine six-month followup visit. The patient is feeling fine today with no specific complaints. She has no significant issues since her last visit except for mild back pain. She was recently diagnosed with staph skin infection and received several courses of antibiotics by her primary care physician. She denied having any significant weight loss or night sweats. She has no chest pain but continues to have shortness breath with exertion, no cough or hemoptysis. She has repeat CT scan of the chest performed recently and she is here for evaluation and discussion of her scan results.  MEDICAL HISTORY: Past Medical History  Diagnosis Date  . Asthma   . Fibrocystic disease of breast   . Mini stroke     x2. Dr. Jefm Miles  / Dr. Lenora Boys   . Hematuria     Dr. Porfirio Mylar    . Renal calculi   . Hyperlipidemia   . Obesity   . Atrial fibrillation     on coumadin   . Hypertension   . Arthritis   . Seasonal allergies   . Nephrolithiasis   . Cystic disease of breast   . 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 Stroke   . Renal calculi     ALLERGIES:  is allergic to contrast media; iohexol; sulfa antibiotics; and sulfamethoxazole-trimethoprim.  MEDICATIONS:  Current Outpatient Prescriptions    Medication Sig Dispense Refill  . acetaminophen (TYLENOL) 500 MG tablet Take 1,000 mg by mouth every 6 (six) hours as needed. For pain.      Marland Kitchen albuterol (PROAIR HFA) 108 (90 BASE) MCG/ACT inhaler USE 2 INHALATIONS EVERY 6 HOURS AS NEEDED  18 g  2  . Garlic 1000 MG CAPS Take 1 capsule by mouth 2 (two) times daily.       . hydrochlorothiazide (MICROZIDE) 12.5 MG capsule Take 2 capsules (25 mg total) by mouth daily.  180 capsule  0  . metoprolol (LOPRESSOR) 100 MG tablet Take 1 tablet (100 mg total) by mouth 2 (two) times daily.  180 tablet  0  . nystatin (MYCOSTATIN/NYSTOP) 100000 UNIT/GM POWD 3 times daily  60 g  3  . simvastatin (ZOCOR) 10 MG tablet Take 1 tablet (10 mg total) by mouth at bedtime.  90 tablet  1  . warfarin (COUMADIN) 5 MG tablet Take 0.5-1 tablets (2.5-5 mg total) by mouth See admin instructions. 0.5 tab daily except 1 tab daily on Tuesdays and Saturdays.  90 tablet  1  . [DISCONTINUED] Calcium Citrate-Vitamin D (CALCIUM CITRATE + D) 300-100 MG-UNIT TABS Take 0.5 tablets by mouth 2 (two) times daily.        . [DISCONTINUED] metoprolol (TOPROL-XL) 100 MG 24 hr tablet Take 100 mg by mouth daily.         No current facility-administered medications for this visit.  SURGICAL HISTORY:  Past Surgical History  Procedure Laterality Date  . Tonsillectomy  50  . Kidney stones  [age]  . Cyst of  left breast and right breast      Dr. Irena Reichmann   . Abdominal hysterectomy  1990    Dr. Colon Branch   . Dilation and curettage of uterus    . Lung cancer surgery  10/01/11  DR.BURNEY    (L)VATS,ANT. MINI THORACOTOMY, WEDGE RESECTION OF LULOBE LESION WITH NODWE SAMPLING    REVIEW OF SYSTEMS:  A comprehensive review of systems was negative except for: Respiratory: positive for dyspnea on exertion Musculoskeletal: positive for back pain   PHYSICAL EXAMINATION: General appearance: alert, cooperative and no distress Head: Normocephalic, without obvious abnormality, atraumatic Neck: no  adenopathy Lymph nodes: Cervical, supraclavicular, and axillary nodes normal. Resp: clear to auscultation bilaterally Cardio: regular rate and rhythm, S1, S2 normal, no murmur, click, rub or gallop GI: soft, non-tender; bowel sounds normal; no masses,  no organomegaly Extremities: extremities normal, atraumatic, no cyanosis or edema  ECOG PERFORMANCE STATUS: 1 - Symptomatic but completely ambulatory  Blood pressure 152/89, pulse 79, temperature 99.1 F (37.3 C), temperature source Oral, resp. rate 20, height 5\' 11"  (1.803 m), weight 280 lb 6.4 oz (127.189 kg).  LABORATORY DATA: Lab Results  Component Value Date   WBC 7.0 10/22/2013   HGB 14.7 10/22/2013   HCT 43.7 10/22/2013   MCV 83.2 10/22/2013   PLT 187 10/22/2013      Chemistry      Component Value Date/Time   NA 142 10/22/2013 1033   NA 143 12/08/2012   NA 140 03/13/2012 0405   K 4.1 10/22/2013 1033   K 3.2* 03/13/2012 0405   CL 101 04/24/2013 0959   CL 97 03/13/2012 0405   CO2 26 10/22/2013 1033   CO2 35* 03/13/2012 0405   BUN 17.7 10/22/2013 1033   BUN 17 12/08/2012   BUN 27* 03/13/2012 0405   CREATININE 0.8 10/22/2013 1033   CREATININE 0.7 12/08/2012   CREATININE 0.64 03/13/2012 0405   GLU 97 12/08/2012      Component Value Date/Time   CALCIUM 9.7 10/22/2013 1033   CALCIUM 8.6 03/13/2012 0405   ALKPHOS 46 10/22/2013 1033   ALKPHOS 54 03/09/2012 0635   AST 18 10/22/2013 1033   AST 23 03/09/2012 0635   ALT 15 10/22/2013 1033   ALT 14 03/09/2012 0635   BILITOT 0.47 10/22/2013 1033   BILITOT 0.2* 03/09/2012 0635       RADIOGRAPHIC STUDIES: Ct Chest Wo Contrast  10/22/2013   CLINICAL DATA:  Followup lung cancer.  EXAM: CT CHEST WITHOUT CONTRAST  TECHNIQUE: Multidetector CT imaging of the chest was performed following the standard protocol without IV contrast.  COMPARISON:  04/24/2013 and baseline examination of 08/20/2010.  FINDINGS: No pathologically enlarged mediastinal, hilar or axillary lymph nodes. Pulmonary  arteries are borderline enlarged. Atherosclerotic calcification of the arterial vasculature, including coronary arteries. Heart is mildly enlarged. No pericardial effusion. Pre pericardiac lymph node is subcentimeter in short axis size.  Biapical pleural parenchymal scarring. Postoperative changes in the left hemi thorax with associated scarring and volume loss. There are scattered solid and ground-glass nodules bilaterally, right greater than left. Index solid nodule in the right upper lobe measures 9 x 12 mm (image 18), unchanged when slight differences in measurement technique are considered. Largest ground-glass nodule is seen in the superior segment right lower lobe, measuring 1.4 x 1.6 cm, also unchanged when slight differences in measurement technique  are considered. When compared with baseline examination of 08/20/2010, slight interval progression in size is noted. No pleural fluid. Airway is unremarkable.  Incidental imaging of the upper abdomen shows a 2.2 cm stone in the gallbladder. A 6 mm stone is seen in the right kidney. Low-attenuation lesions off the left kidney measure up to 8.2 cm, as before. No worrisome lytic or sclerotic lesions. Degenerative changes are seen in the spine.  IMPRESSION: 1. Bilateral pulmonary nodules, many of which are ground-glass in attenuation, are unchanged from 04/24/2013 but appear slightly larger than on baseline examination of 08/20/2010. Low-grade adenocarcinoma can have this appearance. 2. Borderline enlarged pulmonary arteries, indicative of pulmonary arterial hypertension. 3. Coronary artery calcification. 4. Cholelithiasis. 5. Right nephrolithiasis.   Electronically Signed   By: Leanna Battles M.D.   On: 10/22/2013 13:25   US Breast Right  10/10/2013   CLINICAL DATA:  72 year old female with reported history of a prior infection in the lower inner right which the patient states is near resolved after a course of antibiotics. The patient reports a history of  remote benign bilateral excisional breast biopsies.  EXAM: DIGITAL DIAGNOSTIC  BILATERAL MAMMOGRAM  RIGHT BREAST ULTRASOUND  COMPARISON:  Prior mammograms from 2012 and 2013 which can only be viewed for comparison on a CD.  ACR Breast Density Category c: The breasts are heterogeneously dense, which may obscure small masses.  FINDINGS: There is architectural distortion in the upper-outer right breast which is confirmed on the additional CC and MLO spot compression views and appears overall similar when compared to the prior exams. There are no definite mammographic abnormalities seen at the site of concern in the right breast at reported site of prior infection. There is no mammographic evidence of malignancy in the left breast.  Mammographic images were processed with CAD.  Physical exam of the upper-outer right breast as well as the lower inner right breast does not reveal any palpable abnormalities. There is a 6-7 cm linear incisional scar seen on the skin of the upper outer right breast. There is a very mild erythema in the inner right breast.  Targeted ultrasound of the right breast was performed. No hypoechoic collections are seen in the inner right breast to suggest an abscess. Innumerable cysts were seen, with a larger cyst at 3 o'clock 1 cm from the nipple measuring 0.4 x 0.4 x 0.4 cm.  The entire upper outer right breast was scanned. Innumerable cysts are seen in the right breast at 9-11 o'clock. There is a vague and non mass-like hypoechoic area in the right breast at 9 o'clock 4 cm from the nipple just beneath the scar in the upper-outer right breast consistent with a postsurgical scar.  IMPRESSION: 1. No mammographic or sonographic abnormality seen in the inner right breast at site of reported resolving infection. Multiple cysts are present in this location, however no findings to suggest an abscess.  2. Right breast architectural distortion directly at the site of a postsurgical scar, with the  mammographic appearance similar to the prior exams (although comparison limited due to images being on a CD). Findings are consistent with postoperative changes.  3.  No mammographic evidence of malignancy in either breast.  RECOMMENDATION: Screening mammogram in one year.(Code:SM-B-01Y)  I have discussed the findings and recommendations with the patient. Results were also provided in writing at the conclusion of the visit. If applicable, a reminder letter will be sent to the patient regarding the next appointment.  BI-RADS CATEGORY  2: Benign Finding(s)  Electronically Signed   By: Edwin Cap M.D.   On: 10/10/2013 17:28   Mm Digital Diagnostic Bilat  10/10/2013   CLINICAL DATA:  72 year old female with reported history of a prior infection in the lower inner right which the patient states is near resolved after a course of antibiotics. The patient reports a history of remote benign bilateral excisional breast biopsies.  EXAM: DIGITAL DIAGNOSTIC  BILATERAL MAMMOGRAM  RIGHT BREAST ULTRASOUND  COMPARISON:  Prior mammograms from 2012 and 2013 which can only be viewed for comparison on a CD.  ACR Breast Density Category c: The breasts are heterogeneously dense, which may obscure small masses.  FINDINGS: There is architectural distortion in the upper-outer right breast which is confirmed on the additional CC and MLO spot compression views and appears overall similar when compared to the prior exams. There are no definite mammographic abnormalities seen at the site of concern in the right breast at reported site of prior infection. There is no mammographic evidence of malignancy in the left breast.  Mammographic images were processed with CAD.  Physical exam of the upper-outer right breast as well as the lower inner right breast does not reveal any palpable abnormalities. There is a 6-7 cm linear incisional scar seen on the skin of the upper outer right breast. There is a very mild erythema in the inner right  breast.  Targeted ultrasound of the right breast was performed. No hypoechoic collections are seen in the inner right breast to suggest an abscess. Innumerable cysts were seen, with a larger cyst at 3 o'clock 1 cm from the nipple measuring 0.4 x 0.4 x 0.4 cm.  The entire upper outer right breast was scanned. Innumerable cysts are seen in the right breast at 9-11 o'clock. There is a vague and non mass-like hypoechoic area in the right breast at 9 o'clock 4 cm from the nipple just beneath the scar in the upper-outer right breast consistent with a postsurgical scar.  IMPRESSION: 1. No mammographic or sonographic abnormality seen in the inner right breast at site of reported resolving infection. Multiple cysts are present in this location, however no findings to suggest an abscess.  2. Right breast architectural distortion directly at the site of a postsurgical scar, with the mammographic appearance similar to the prior exams (although comparison limited due to images being on a CD). Findings are consistent with postoperative changes.  3.  No mammographic evidence of malignancy in either breast.  RECOMMENDATION: Screening mammogram in one year.(Code:SM-B-01Y)  I have discussed the findings and recommendations with the patient. Results were also provided in writing at the conclusion of the visit. If applicable, a reminder letter will be sent to the patient regarding the next appointment.  BI-RADS CATEGORY  2: Benign Finding(s)   Electronically Signed   By: Edwin Cap M.D.   On: 10/10/2013 17:28   ASSESSMENT AND PLAN:  1) stage IA non-small cell lung cancer status post left upper lobe wedge resection: No evidence for disease progression on his recent scan but the patient continues to have stable bilateral pulmonary nodules suspicious for low-grade adenocarcinoma.  She'll continue on observation with repeat CT scan of the chest in 6 months. She was advised to call immediately she has any concerning symptoms in  the interval. 2) history of staph infection of the skin currently under treatment by her primary care physician with antibiotics. 3) history of hypertension, dyslipidemia and asthma: Continue current treatment as prescribed by your family doctor. All questions were answered.  The patient knows to call the clinic with any problems, questions or concerns. We can certainly see the patient much sooner if necessary.

## 2013-11-02 ENCOUNTER — Other Ambulatory Visit: Payer: Self-pay | Admitting: Internal Medicine

## 2013-11-02 ENCOUNTER — Other Ambulatory Visit: Payer: Self-pay | Admitting: Nurse Practitioner

## 2013-11-07 ENCOUNTER — Ambulatory Visit (HOSPITAL_COMMUNITY)
Admission: RE | Admit: 2013-11-07 | Discharge: 2013-11-07 | Disposition: A | Discharge status: Home / Self Care | Payer: Medicare Other | Source: Ambulatory Visit | Attending: Nurse Practitioner | Admitting: Nurse Practitioner

## 2013-11-07 ENCOUNTER — Other Ambulatory Visit (HOSPITAL_COMMUNITY): Payer: Self-pay | Admitting: Nurse Practitioner

## 2013-11-07 ENCOUNTER — Other Ambulatory Visit: Payer: Self-pay | Admitting: Nurse Practitioner

## 2013-11-07 ENCOUNTER — Ambulatory Visit (HOSPITAL_COMMUNITY): Payer: Medicare Other

## 2013-11-07 DIAGNOSIS — N6489 Other specified disorders of breast: Secondary | ICD-10-CM | POA: Insufficient documentation

## 2013-11-07 DIAGNOSIS — C50919 Malignant neoplasm of unspecified site of unspecified female breast: Secondary | ICD-10-CM | POA: Insufficient documentation

## 2013-11-07 MED ORDER — LIDOCAINE HCL (PF) 2 % IJ SOLN
INTRAMUSCULAR | Status: AC
  Filled 2013-11-07: qty 10

## 2013-11-07 NOTE — Progress Notes (Signed)
Biopsy complete no signs of distress  

## 2013-11-13 ENCOUNTER — Other Ambulatory Visit: Payer: Self-pay | Admitting: *Deleted

## 2013-11-13 ENCOUNTER — Telehealth: Payer: Self-pay | Admitting: *Deleted

## 2013-11-13 DIAGNOSIS — C50212 Malignant neoplasm of upper-inner quadrant of left female breast: Secondary | ICD-10-CM | POA: Insufficient documentation

## 2013-11-13 DIAGNOSIS — C50411 Malignant neoplasm of upper-outer quadrant of right female breast: Secondary | ICD-10-CM

## 2013-11-13 NOTE — Telephone Encounter (Signed)
Confirmed [facility] for 11/21/13 at 1200 .  Instructions and contact information given.

## 2013-11-14 ENCOUNTER — Telehealth: Payer: Self-pay | Admitting: *Deleted

## 2013-11-14 NOTE — Telephone Encounter (Signed)
R/s pt [facility] to 11/21/13 at 0830.  Confirmed new appt time with pt.

## 2013-11-21 ENCOUNTER — Ambulatory Visit: Payer: Medicare Other | Admitting: Oncology

## 2013-11-21 ENCOUNTER — Other Ambulatory Visit (HOSPITAL_BASED_OUTPATIENT_CLINIC_OR_DEPARTMENT_OTHER): Payer: Medicare Other | Admitting: Lab

## 2013-11-21 ENCOUNTER — Ambulatory Visit (HOSPITAL_BASED_OUTPATIENT_CLINIC_OR_DEPARTMENT_OTHER): Payer: Medicare Other | Admitting: General Surgery

## 2013-11-21 ENCOUNTER — Encounter (INDEPENDENT_AMBULATORY_CARE_PROVIDER_SITE_OTHER): Payer: Self-pay | Admitting: General Surgery

## 2013-11-21 ENCOUNTER — Other Ambulatory Visit: Payer: Medicare Other | Admitting: Lab

## 2013-11-21 ENCOUNTER — Encounter: Payer: Self-pay | Admitting: *Deleted

## 2013-11-21 ENCOUNTER — Ambulatory Visit (HOSPITAL_BASED_OUTPATIENT_CLINIC_OR_DEPARTMENT_OTHER): Payer: Medicare Other

## 2013-11-21 ENCOUNTER — Ambulatory Visit (HOSPITAL_BASED_OUTPATIENT_CLINIC_OR_DEPARTMENT_OTHER): Payer: Medicare Other | Admitting: Oncology

## 2013-11-21 ENCOUNTER — Other Ambulatory Visit (INDEPENDENT_AMBULATORY_CARE_PROVIDER_SITE_OTHER): Payer: Self-pay

## 2013-11-21 ENCOUNTER — Ambulatory Visit
Admission: RE | Admit: 2013-11-21 | Discharge: 2013-11-21 | Disposition: A | Discharge status: Home / Self Care | Payer: Medicare Other | Source: Ambulatory Visit | Attending: Radiation Oncology | Admitting: Radiation Oncology

## 2013-11-21 ENCOUNTER — Encounter (INDEPENDENT_AMBULATORY_CARE_PROVIDER_SITE_OTHER): Payer: Self-pay

## 2013-11-21 ENCOUNTER — Ambulatory Visit: Payer: Medicare Other | Attending: General Surgery | Admitting: Physical Therapy

## 2013-11-21 ENCOUNTER — Ambulatory Visit: Payer: Medicare Other

## 2013-11-21 ENCOUNTER — Telehealth (INDEPENDENT_AMBULATORY_CARE_PROVIDER_SITE_OTHER): Payer: Self-pay

## 2013-11-21 ENCOUNTER — Encounter: Payer: Self-pay | Admitting: Oncology

## 2013-11-21 VITALS — BP 144/79 | HR 80 | Temp 98.4°F | Resp 18 | Ht 71.0 in | Wt 278.6 lb

## 2013-11-21 DIAGNOSIS — C50419 Malignant neoplasm of upper-outer quadrant of unspecified female breast: Secondary | ICD-10-CM

## 2013-11-21 DIAGNOSIS — C3410 Malignant neoplasm of upper lobe, unspecified bronchus or lung: Secondary | ICD-10-CM

## 2013-11-21 DIAGNOSIS — C50411 Malignant neoplasm of upper-outer quadrant of right female breast: Secondary | ICD-10-CM

## 2013-11-21 DIAGNOSIS — Z171 Estrogen receptor negative status [ER-]: Secondary | ICD-10-CM

## 2013-11-21 DIAGNOSIS — C50919 Malignant neoplasm of unspecified site of unspecified female breast: Secondary | ICD-10-CM | POA: Insufficient documentation

## 2013-11-21 DIAGNOSIS — IMO0001 Reserved for inherently not codable concepts without codable children: Secondary | ICD-10-CM | POA: Insufficient documentation

## 2013-11-21 DIAGNOSIS — R29898 Other symptoms and signs involving the musculoskeletal system: Secondary | ICD-10-CM

## 2013-11-21 DIAGNOSIS — M549 Dorsalgia, unspecified: Secondary | ICD-10-CM | POA: Insufficient documentation

## 2013-11-21 DIAGNOSIS — R293 Abnormal posture: Secondary | ICD-10-CM | POA: Insufficient documentation

## 2013-11-21 LAB — CBC WITH DIFFERENTIAL/PLATELET
BASO%: 1.3 % (ref 0.0–2.0)
Basophils Absolute: 0.1 10*3/uL (ref 0.0–0.1)
EOS%: 3.1 % (ref 0.0–7.0)
HCT: 43.6 % (ref 34.8–46.6)
HGB: 14.2 g/dL (ref 11.6–15.9)
MCH: 27.3 pg (ref 25.1–34.0)
MCHC: 32.6 g/dL (ref 31.5–36.0)
MCV: 83.7 fL (ref 79.5–101.0)
MONO%: 8.3 % (ref 0.0–14.0)
NEUT%: 63.8 % (ref 38.4–76.8)

## 2013-11-21 LAB — COMPREHENSIVE METABOLIC PANEL (CC13)
ALT: 16 U/L (ref 0–55)
AST: 18 U/L (ref 5–34)
Albumin: 3.6 g/dL (ref 3.5–5.0)
Alkaline Phosphatase: 57 U/L (ref 40–150)
BUN: 21.8 mg/dL (ref 7.0–26.0)
Chloride: 105 mEq/L (ref 98–109)
Creatinine: 0.8 mg/dL (ref 0.6–1.1)
Glucose: 105 mg/dl (ref 70–140)
Total Bilirubin: 0.55 mg/dL (ref 0.20–1.20)

## 2013-11-21 NOTE — Telephone Encounter (Signed)
Message copied by Brennan Bailey on Wed Nov 21, 2013 11:04 AM ------      Message from: Caleen Essex III      Created: Wed Nov 21, 2013 10:45 AM       This lady needs cardiac clearance from Dr. Antoine Poche at [facility] so she can have right wire loc lumpectomy and sentinel node bx. She needs to be off her coumadin 5 days before. Does she need bridging with lovenox? Thanks. PJ ------

## 2013-11-21 NOTE — Progress Notes (Signed)
ID: Tammie [name] Mom OB: August 04, 1941  MR#: [id number]  NFA#:[id number]  PCP: Rudi Heap, MD GYN:   SU:  OTHER MD: Si Gaul, Barron Alvine  CHIEF COMPLAINT: "Now I have another cancer"  HISTORY OF PRESENT ILLNESS: [name] is followed every 6 months by my partner, Dr. Shirline Frees, for a history of stage I lung adenocarcinoma, status post left upper lobe wedge resection with negative lymph nodes October of 2012. She did not require adjuvant treatment.  More recently, screening mammography October 2014 noted a possible distortion in the patient's right breast, which was confirmed by additional views 11/02/2013. Right breast ultrasound was obtained the same day and showed innumerable cysts. There was an area of non-mass like enhancement at the 9:00 position 4 cm from the nipple, measuring 1.5 cm. Biopsy of this area 11/07/2013 showed ([id number]) and invasive ductal carcinoma, grade 1 or 2, triple negative, with an MIB-1 of 11%.  The patient's subsequent history is as detailed below  INTERVAL HISTORY: Tammie [name] was evaluated in the multidisciplinary breast cancer clinic 11/21/2013  REVIEW OF SYSTEMS: There were no specific symptoms leading to the screening mammogram, which was routinely scheduled. Anginette gives me a variety of symptoms, which I do not believe are related to either this diagnosis or earlier lung cancer diagnosis. In particular she tells me she had 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 She has significant pain in both knees. All this keeps her from ambulating much. She does do her house work, drives, and does a little cooking. She does not exercise regularly. She tells me if she sits on the floor she will need significant help to get up. She denies cough, phlegm production, pleurisy, hemoptysis, or worsening shortness of breath. She denies chest pain or pressure. There have been no bleeding,  rash, unexplained weight loss or unexplained fatigue. A detailed review of systems today was otherwise noncontributory  PAST MEDICAL HISTORY: Past Medical History  Diagnosis Date  . Asthma   . Fibrocystic disease of breast   . Mini stroke     x2. Dr. Jefm Miles  / Dr. Lenora Boys   . Hematuria     Dr. Porfirio Mylar    . Renal calculi   . Hyperlipidemia   . Obesity   . Atrial fibrillation     on coumadin   . Hypertension   . Arthritis   . Seasonal allergies   . Nephrolithiasis   . Cystic disease of breast   . 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 Stroke   . Renal calculi     PAST SURGICAL HISTORY: Past Surgical History  Procedure Laterality Date  . Tonsillectomy  50  . Kidney stones  [age]  . Cyst of  left breast and right breast      Dr. Irena Reichmann   . Abdominal hysterectomy  1990    Dr. Colon Branch   . Dilation and curettage of uterus    . Lung cancer surgery  10/01/11  DR.BURNEY    (L)VATS,ANT. MINI THORACOTOMY, WEDGE RESECTION OF LULOBE LESION WITH NODWE SAMPLING    FAMILY HISTORY Family History  Problem Relation Age of Onset  . Throat cancer Mother   . Cancer Mother    the patient's father died in an automobile accident at age 38. The patient's mother died from throat cancer at the age of 77. The patient has 4 brothers, no sisters. There is no  history of breast or ovarian cancer in the family to her knowledge.  GYNECOLOGIC HISTORY:  Menarche age 42, hysterectomy a round and 1990. She did not have stopping the oophorectomy. She took post estrogen until approximately 1996, when it was discontinued because of "mini strokes". 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 At home she lives with her friend, body Tammie [name], who is approximately 65 years old. She is his primary caregiver. The patient's only daughter, Tammie [name], was bipolar according to the patient and died from medication-related  complications at the age of 36. The patient has 2 grandsons, Tammie [name] who is a Surveyor, minerals living [place] and [employer] who also works as a Surveyor, minerals, in [employer]. The patient has 3 great-granddaughters. She is a Control and instrumentation engineer    ADVANCED DIRECTIVES: Not in place. However the patient tells me she intends to name her grandson Tammie [name] is her healthcare power of attorney   HEALTH MAINTENANCE: History  Substance Use Topics  . Smoking status: Former Smoker -- 3.00 packs/day for 32 years    Types: Cigarettes  . Smokeless tobacco: Former Neurosurgeon    Quit date: 03/08/1990     Comment: smoked 3ppd from 1959-1991   . Alcohol Use: No     Colonoscopy: 2008 in [place]  PAP: 2013  Bone density: Never  Lipid panel:  Allergies  Allergen Reactions  . Contrast Media [Iodinated Diagnostic Agents]   . Iohexol      Code: HIVES, Desc: hives w/ contrast '08// better w/ benadryl   . Sulfa Antibiotics   . Sulfamethoxazole-Trimethoprim     Current Outpatient Prescriptions  Medication Sig Dispense Refill  . acetaminophen (TYLENOL) 500 MG tablet Take 1,000 mg by mouth every 6 (six) hours as needed. For pain.      Marland Kitchen albuterol (PROAIR HFA) 108 (90 BASE) MCG/ACT inhaler USE 2 INHALATIONS EVERY 6 HOURS AS NEEDED  18 g  2  . Garlic 1000 MG CAPS Take 1 capsule by mouth 2 (two) times daily.       . hydrochlorothiazide (MICROZIDE) 12.5 MG capsule Take 2 capsules (25 mg total) by mouth daily.  180 capsule  0  . metoprolol (LOPRESSOR) 100 MG tablet Take 1 tablet (100 mg total) by mouth 2 (two) times daily.  180 tablet  0  . nystatin (MYCOSTATIN/NYSTOP) 100000 UNIT/GM POWD 3 times daily  60 g  3  . simvastatin (ZOCOR) 10 MG tablet Take 1 tablet (10 mg total) by mouth at bedtime.  90 tablet  1  . warfarin (COUMADIN) 5 MG tablet Take 0.5-1 tablets (2.5-5 mg total) by mouth See admin instructions. 0.5 tab daily except 1 tab daily on Tuesdays and Saturdays.  90 tablet  1  .  [DISCONTINUED] Calcium Citrate-Vitamin D (CALCIUM CITRATE + D) 300-100 MG-UNIT TABS Take 0.5 tablets by mouth 2 (two) times daily.        . [DISCONTINUED] metoprolol (TOPROL-XL) 100 MG 24 hr tablet Take 100 mg by mouth daily.         No current facility-administered medications for this visit.    OBJECTIVE: Older white woman who appears stated age 73 Vitals:   11/21/13 0859  BP: 144/79  Pulse: 80  Temp: 98.4 F (36.9 C)  Resp: 18     Body mass index is 38.87 kg/(m^2).    ECOG FS:1 - Symptomatic but completely ambulatory  Ocular: Sclerae unicteric, pupils equal, round  Ear-nose-throat: Oropharynx clear and moist Lymphatic: No cervical or supraclavicular adenopathy Lungs no rales or rhonchi, fair excursion bilaterally Heart regular rate and rhythm,  Abd soft, obese, nontender, positive bowel sounds MSK no focal spinal tenderness, no upper extremity lymphedema Neuro: non-focal, well-oriented, appropriate affect Breasts: I do not palpate a mass in the right breast. There is no skin or nipple change of concern. The right axilla is benign to left breast is unremarkable Skin: The patient has a dermatophytic rash under both breasts and beneath the pannicular fold   LAB RESULTS:  CMP     Component Value Date/Time   NA 145 11/21/2013 0840   NA 143 12/08/2012   NA 140 03/13/2012 0405   K 4.0 11/21/2013 0840   K 3.2* 03/13/2012 0405   CL 101 04/24/2013 0959   CL 97 03/13/2012 0405   CO2 28 11/21/2013 0840   CO2 35* 03/13/2012 0405   GLUCOSE 105 11/21/2013 0840   GLUCOSE 99 04/24/2013 0959   GLUCOSE 139* 03/13/2012 0405   BUN 21.8 11/21/2013 0840   BUN 17 12/08/2012   BUN 27* 03/13/2012 0405   CREATININE 0.8 11/21/2013 0840   CREATININE 0.7 12/08/2012   CREATININE 0.64 03/13/2012 0405   CALCIUM 9.2 11/21/2013 0840   CALCIUM 8.6 03/13/2012 0405   PROT 7.0 11/21/2013 0840   PROT 7.9 03/09/2012 0635   ALBUMIN 3.6 11/21/2013 0840   ALBUMIN 3.7 03/09/2012 0635   AST 18 11/21/2013 0840    AST 23 03/09/2012 0635   ALT 16 11/21/2013 0840   ALT 14 03/09/2012 0635   ALKPHOS 57 11/21/2013 0840   ALKPHOS 54 03/09/2012 0635   BILITOT 0.55 11/21/2013 0840   BILITOT 0.2* 03/09/2012 0635   GFRNONAA 88* 03/13/2012 0405   GFRAA >90 03/13/2012 0405    I No results found for this basename: SPEP, UPEP,  kappa and lambda light chains    Lab Results  Component Value Date   WBC 6.9 11/21/2013   NEUTROABS 4.4 11/21/2013   HGB 14.2 11/21/2013   HCT 43.6 11/21/2013   MCV 83.7 11/21/2013   PLT 202 11/21/2013      Chemistry      Component Value Date/Time   NA 145 11/21/2013 0840   NA 143 12/08/2012   NA 140 03/13/2012 0405   K 4.0 11/21/2013 0840   K 3.2* 03/13/2012 0405   CL 101 04/24/2013 0959   CL 97 03/13/2012 0405   CO2 28 11/21/2013 0840   CO2 35* 03/13/2012 0405   BUN 21.8 11/21/2013 0840   BUN 17 12/08/2012   BUN 27* 03/13/2012 0405   CREATININE 0.8 11/21/2013 0840   CREATININE 0.7 12/08/2012   CREATININE 0.64 03/13/2012 0405   GLU 97 12/08/2012      Component Value Date/Time   CALCIUM 9.2 11/21/2013 0840   CALCIUM 8.6 03/13/2012 0405   ALKPHOS 57 11/21/2013 0840   ALKPHOS 54 03/09/2012 0635   AST 18 11/21/2013 0840   AST 23 03/09/2012 0635   ALT 16 11/21/2013 0840   ALT 14 03/09/2012 0635   BILITOT 0.55 11/21/2013 0840   BILITOT 0.2* 03/09/2012 0635       No results found for this basename: LABCA2    No components found with this basename: LABCA125    No results found for this basename: INR,  in the last 168 hours  Urinalysis    Component Value Date/Time   COLORURINE YELLOW 09/29/2011 1241   APPEARANCEUR  CLEAR 09/29/2011 1241   LABSPEC 1.017 09/29/2011 1241   PHURINE 7.0 09/29/2011 1241   GLUCOSEU NEGATIVE 09/29/2011 1241   HGBUR NEGATIVE 09/29/2011 1241   BILIRUBINUR neg 08/23/2013 1515   BILIRUBINUR NEGATIVE 09/29/2011 1241   KETONESUR NEGATIVE 09/29/2011 1241   PROTEINUR NEGATIVE 09/29/2011 1241   UROBILINOGEN negative 08/23/2013 1515   UROBILINOGEN 0.2  09/29/2011 1241   NITRITE pos 08/23/2013 1515   NITRITE NEGATIVE 09/29/2011 1241   LEUKOCYTESUR Negative 08/23/2013 1515    STUDIES: Ct Chest Wo Contrast  10/22/2013   CLINICAL DATA:  Followup lung cancer.  EXAM: CT CHEST WITHOUT CONTRAST  TECHNIQUE: Multidetector CT imaging of the chest was performed following the standard protocol without IV contrast.  COMPARISON:  04/24/2013 and baseline examination of 08/20/2010.  FINDINGS: No pathologically enlarged mediastinal, hilar or axillary lymph nodes. Pulmonary arteries are borderline enlarged. Atherosclerotic calcification of the arterial vasculature, including coronary arteries. Heart is mildly enlarged. No pericardial effusion. Pre pericardiac lymph node is subcentimeter in short axis size.  Biapical pleural parenchymal scarring. Postoperative changes in the left hemi thorax with associated scarring and volume loss. There are scattered solid and ground-glass nodules bilaterally, right greater than left. Index solid nodule in the right upper lobe measures 9 x 12 mm (image 18), unchanged when slight differences in measurement technique are considered. Largest ground-glass nodule is seen in the superior segment right lower lobe, measuring 1.4 x 1.6 cm, also unchanged when slight differences in measurement technique are considered. When compared with baseline examination of 08/20/2010, slight interval progression in size is noted. No pleural fluid. Airway is unremarkable.  Incidental imaging of the upper abdomen shows a 2.2 cm stone in the gallbladder. A 6 mm stone is seen in the right kidney. Low-attenuation lesions off the left kidney measure up to 8.2 cm, as before. No worrisome lytic or sclerotic lesions. Degenerative changes are seen in the spine.  IMPRESSION: 1. Bilateral pulmonary nodules, many of which are ground-glass in attenuation, are unchanged from 04/24/2013 but appear slightly larger than on baseline examination of 08/20/2010. Low-grade adenocarcinoma  can have this appearance. 2. Borderline enlarged pulmonary arteries, indicative of pulmonary arterial hypertension. 3. Coronary artery calcification. 4. Cholelithiasis. 5. Right nephrolithiasis.   Electronically Signed   By: Leanna Battles M.D.   On: 10/22/2013 13:25   [place] Core Biopsy  11/12/2013   ADDENDUM REPORT: 11/12/2013 14:51  ADDENDUM: The final pathological diagnosis is invasive ductal carcinoma. I discussed this with the patient by telephone on 11/12/2013. Her questions were answered. She reported some bruising at the biopsy site with no pain or palpable hematoma.  Sonnie Alamo, the nurse navigator at the [facility], discussed the final pathological diagnosis with Mary-Margaret Daphine Deutscher. The patient has been receiving surgical and oncological care for lung carcinoma in [place]. Therefore, she was given an appointment at the Multidisciplinary Breast Clinic at the Cancer Center at [facility] on 11/21/2013.   Electronically Signed   By: Gordan Payment M.D.   On: 11/12/2013 14:51   11/12/2013   CLINICAL DATA:  Right breast architectural distortion.  EXAM: US BREAST BX W LOC DEV 1ST LESION IMG BX SPEC US GUIDE*R* ; ULTRASOUND CORE BIOPSY  COMPARISON:  Previous exams.  PROCEDURE: I met with the patient and we discussed the procedure of ultrasound-guided biopsy, including benefits and alternatives. We discussed the high likelihood of a successful procedure. We discussed the risks of the procedure including infection, bleeding, tissue injury, clip migration, and inadequate sampling. Informed  written consent was given. The usual time-out protocol was performed immediately prior to the procedure.  Using sterile technique and 2% Lidocaine as local anesthetic, under direct ultrasound visualization, a 12 gauge vacuum-assisteddevice was used to perform biopsy of right breast architectural distortion 9 o'clock position using a lateral approach. At the conclusion of the procedure, a  ribbon shaped tissue marker clip was deployed into the biopsy cavity. Follow-up 2-view mammogram was performed and dictated separately.  IMPRESSION: Ultrasound-guided biopsy of right breast architectural distortion. No apparent complications.  Electronically Signed: By: Annia Belt M.D. On: 11/07/2013 13:06   Mm Digital Diagnostic Unilat R  11/07/2013   CLINICAL DATA:  Status post ultrasound-guided core needle biopsy right breast architectural distortion.  EXAM: DIGITAL DIAGNOSTIC UNILATERAL RIGHT MAMMOGRAM  COMPARISON:  Previous exams.  FINDINGS: Films are performed following ultrasound guided biopsy of right breast architectural distortion. Ribbon shaped biopsy marking clip is in appropriate position 9 o'clock right breast middle depth.  IMPRESSION: Appropriate position of biopsy marking clip status post ultrasound-guided core needle biopsy.  Final Assessment: Post Procedure Mammograms for Marker Placement   Electronically Signed   By: Annia Belt M.D.   On: 11/07/2013 13:02   [place] Rt Breast Bx W Loc Dev 1st Lesion Img Bx Spec US Guide  11/12/2013   ADDENDUM REPORT: 11/12/2013 14:51  ADDENDUM: The final pathological diagnosis is invasive ductal carcinoma. I discussed this with the patient by telephone on 11/12/2013. Her questions were answered. She reported some bruising at the biopsy site with no pain or palpable hematoma.  Sonnie Alamo, the nurse navigator at the [facility], discussed the final pathological diagnosis with Mary-Margaret Daphine Deutscher. The patient has been receiving surgical and oncological care for lung carcinoma in [place]. Therefore, she was given an appointment at the Multidisciplinary Breast Clinic at the Cancer Center at [facility] on 11/21/2013.   Electronically Signed   By: Gordan Payment M.D.   On: 11/12/2013 14:51   11/12/2013   CLINICAL DATA:  Right breast architectural distortion.  EXAM: US BREAST BX W LOC DEV 1ST LESION IMG BX SPEC US GUIDE*R* ;  ULTRASOUND CORE BIOPSY  COMPARISON:  Previous exams.  PROCEDURE: I met with the patient and we discussed the procedure of ultrasound-guided biopsy, including benefits and alternatives. We discussed the high likelihood of a successful procedure. We discussed the risks of the procedure including infection, bleeding, tissue injury, clip migration, and inadequate sampling. Informed written consent was given. The usual time-out protocol was performed immediately prior to the procedure.  Using sterile technique and 2% Lidocaine as local anesthetic, under direct ultrasound visualization, a 12 gauge vacuum-assisteddevice was used to perform biopsy of right breast architectural distortion 9 o'clock position using a lateral approach. At the conclusion of the procedure, a ribbon shaped tissue marker clip was deployed into the biopsy cavity. Follow-up 2-view mammogram was performed and dictated separately.  IMPRESSION: Ultrasound-guided biopsy of right breast architectural distortion. No apparent complications.  Electronically Signed: By: Annia Belt M.D. On: 11/07/2013 13:06    ASSESSMENT: 72 y.o. [place] woman status post right breast biopsy 11/07/2013 for a clinical T1c N0, stage IA invasive ductal carcinoma, low-grade, triple negative, with an MIB-1 of 11%  (1) also status post left upper lobe wedge resection 10/01/2011 of an invasive moderately differentiated adenocarcinoma measuring 1.3 cm, with no pleural involvement and 4 lymph nodes negative (pT1a pN0)--under close observation  PLAN: We spent the better part of today's hour-long appointment discussing the biology of  breast cancer in general, and the specifics of the patient's tumor in particular. Renella understands this cancer is unrelated to her prior lung cancer. It was not caused by her estrogen replacement. We discussed local treatment options and systemic treatment options and she will clearly benefit from surgery and radiation. The goal of treatment is  cure. This will be discussed in further detail today by her surgeon and radiation oncologist.  The question is whether she should receive some form of systemic treatment or not. She is not a candidate for antiestrogen pills or for anti-HER-2 immunotherapy. Accordingly her only available adjuvant systemic treatment would be chemotherapy.  Even though NCCN guidelines recommend chemotherapy in this setting, the same guidelines alert is that the data for patient's older than 70 is scans and there is accordingly less certainty in their recommendations. With Dondra Spry I reviewed the adjuvant! Prognosis, which tells [place] that with local treatment only her risk of dying from this cancer is going to be less than 5%. Decrease in that risk with aggressive chemotherapy would be between 1 and 2%. This means that 98 women out of 100 like her would not gaining any survival benefit from aggressive chemotherapy. Of course the benefit from less aggressive chemotherapy would be even smaller.  Given these numbers, I am comfortable foregoing chemotherapy. This was also the consensus today at the multidisciplinary breast cancer conference when the patient's case was discussed Aleda has a good understanding of this plan and she is very much in agreement with it.  She already sees Dr. Jerolyn Center every 6 months, and she would prefer to continue followup for this cancer with him, just as she does for the lung cancer. She will need a yearly physician breast exam and yearly mammogram. I will be glad to see Brystol again at any point at Dr. Jerolyn Center discretion. At this point, we are not making her future routine followup appointments in the breast clinic  Incidentally I have written her for nizooral 2% cream to use in her areas of candidal rash. Lowella Dell, MD   11/21/2013 9:53 AM

## 2013-11-21 NOTE — Progress Notes (Signed)
Faxed Care Plan to PCP. 

## 2013-11-21 NOTE — Progress Notes (Signed)
Patient ID: Tammie [name], female   DOB: 03/11/1941, 72 y.o.   MRN: [id number]  No chief complaint on file.   HPI Tammie [name] is a 72 y.o. female.  We are asked to see the patient in consultation by Dr. Eagle to evaluate her for a Right breast cancer. The patient is a 72-year-old white female who recently developed a rash underneath both breasts. This became red and inflamed. As part of her evaluation she underwent mammogram and ultrasound. This revealed a 1.5 cm mass in the lateral aspect of the right breast. This was biopsied and came back as a grade 1 breast cancer. She was triple negative. She denies any breast pain. She denies any discharge from her nipple. HPI  Past Medical History  Diagnosis Date  . Asthma   . Fibrocystic disease of breast   . Mini stroke     x2. Dr. McCatherz  / Dr. Rhinhart   . Hematuria     Dr. Rawl    . Renal calculi   . Hyperlipidemia   . Obesity   . Atrial fibrillation     on coumadin   . Hypertension   . Arthritis   . Seasonal allergies   . Nephrolithiasis   . Cystic disease of breast   . 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 Stroke   . Renal calculi   . Breast cancer     Past Surgical History  Procedure Laterality Date  . Tonsillectomy  50  . Kidney stones  [age]  . Cyst of  left breast and right breast      Dr. Brunch   . Abdominal hysterectomy  1990    Dr. Strand   . Dilation and curettage of uterus    . Lung cancer surgery  10/01/11  DR.BURNEY    (L)VATS,ANT. MINI THORACOTOMY, WEDGE RESECTION OF LULOBE LESION WITH NODWE SAMPLING    Family History  Problem Relation Age of Onset  . Throat cancer Mother   . Cancer Mother     Social History History  Substance Use Topics  . Smoking status: Former Smoker -- 3.00 packs/day for 32 years    Types: Cigarettes  . Smokeless tobacco: Former User    Quit date: 03/08/1990     Comment: smoked 3ppd from 1959-1991   . Alcohol Use: No    Allergies  Allergen Reactions  . Contrast  Media [Iodinated Diagnostic Agents]   . Iohexol      Code: HIVES, Desc: hives w/ contrast '08// better w/ benadryl   . Sulfa Antibiotics   . Sulfamethoxazole-Trimethoprim     Current Outpatient Prescriptions  Medication Sig Dispense Refill  . acetaminophen (TYLENOL) 500 MG tablet Take 1,000 mg by mouth every 6 (six) hours as needed. For pain.      . albuterol (PROAIR HFA) 108 (90 BASE) MCG/ACT inhaler USE 2 INHALATIONS EVERY 6 HOURS AS NEEDED  18 g  2  . Garlic 1000 MG CAPS Take 1 capsule by mouth 2 (two) times daily.       . hydrochlorothiazide (MICROZIDE) 12.5 MG capsule Take 2 capsules (25 mg total) by mouth daily.  180 capsule  0  . metoprolol (LOPRESSOR) 100 MG tablet Take 1 tablet (100 mg total) by mouth 2 (two) times daily.  180 tablet  0  . nystatin (MYCOSTATIN/NYSTOP) 100000 UNIT/GM POWD 3 times daily  60 g  3  . simvastatin (ZOCOR) 10 MG tablet Take 1 tablet (10 mg total) by mouth   at bedtime.  90 tablet  1  . warfarin (COUMADIN) 5 MG tablet Take 0.5-1 tablets (2.5-5 mg total) by mouth See admin instructions. 0.5 tab daily except 1 tab daily on Tuesdays and Saturdays.  90 tablet  1  . [DISCONTINUED] Calcium Citrate-Vitamin D (CALCIUM CITRATE + D) 300-100 MG-UNIT TABS Take 0.5 tablets by mouth 2 (two) times daily.        . [DISCONTINUED] metoprolol (TOPROL-XL) 100 MG 24 hr tablet Take 100 mg by mouth daily.         No current facility-administered medications for this visit.    Review of Systems Review of Systems  Constitutional: Negative.   HENT: Negative.   Eyes: Negative.   Respiratory: Negative.   Cardiovascular: Negative.   Gastrointestinal: Negative.   Endocrine: Negative.   Genitourinary: Negative.   Musculoskeletal: Negative.   Skin: Positive for rash.  Allergic/Immunologic: Negative.   Neurological: Negative.   Hematological: Negative.   Psychiatric/Behavioral: Negative.     There were no vitals taken for this visit.  Physical Exam Physical Exam   Constitutional: She is oriented to person, place, and time. She appears well-developed and well-nourished.  HENT:  Head: Normocephalic and atraumatic.  Eyes: Conjunctivae and EOM are normal. Pupils are equal, round, and reactive to light.  Neck: Normal range of motion. Neck supple.  Cardiovascular: Normal heart sounds.   She has an irregular rate and rhythm  Pulmonary/Chest: Effort normal and breath sounds normal.  There is no palpable mass in either breast. There is no palpable axillary, supraclavicular, or cervical lymphadenopathy  Abdominal: Soft. Bowel sounds are normal. She exhibits no mass. There is no tenderness.  Musculoskeletal: Normal range of motion.  Lymphadenopathy:    She has no cervical adenopathy.  Neurological: She is alert and oriented to person, place, and time.  Skin: Skin is warm and dry.  Psychiatric: She has a normal mood and affect. Her behavior is normal.    Data Reviewed As above  Assessment    The patient appears to have a stage I breast cancer in the lateral aspect of the right breast. I have Talk to her in detail about the different options for treatment. She favors breast conservation I think this is a reasonable choice. She is also a good candidate for sentinel node mapping. I discussed with her in detail the risks and benefits of the operation to do this as well as some of the technical aspects and she understands and wishes to proceed     Plan    Plan for right breast wire localized lumpectomy and sentinel node mapping. She will also need cardiac clearance prior to scheduling surgery so that she can be off her blood thinner        TOTH III,Tammie [name] 11/21/2013, 10:39 AM    

## 2013-11-21 NOTE — Progress Notes (Signed)
Radiation Oncology         [phone]) [phone] ________________________________  Initial outpatient Consultation - Date: 11/21/2013   Name: Tammie [name] MRN: [id number]   DOB: [birth year]  REFERRING PHYSICIAN: Robyne Askew, MD  DIAGNOSIS: T1cN0 Right breast cancer  HISTORY OF PRESENT ILLNESS::Tammie [name] is a 72 y.o. female  who felt a right breast cyst. She was referred for breast imaging and a right breast mass was found in the upper outer quadrant. This was noted to be about a centimeter and 0.5 x 1.2 cm on ultrasound. 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 The Ki-67 was 11%. She is followed by Dr. Arbutus Ped for a stage I low grade adenocarcinoma which she had resected in 2011/06/04. She has stable pulmonary nodules. She is done well since her biopsy. She's had a slight amount of pain. She was referred today for consideration of radiation in the management of her disease by Dr. Carolynne Edouard. She is GX P1 with her first birth at 37. She had her menses at 13 and her last period was in 03-Jun-1989 when she had her hysterectomy. She is hormone replacement but stopped in 06/04/95. Her daughter died last year and she is still recovering from that.Marland Kitchen  PREVIOUS RADIATION THERAPY: No  PAST MEDICAL HISTORY:  has a past medical history of Asthma; Fibrocystic disease of breast; Mini stroke; Hematuria; Renal calculi; Hyperlipidemia; Obesity; Atrial fibrillation; Hypertension; Arthritis; Seasonal allergies; Nephrolithiasis; Cystic disease of breast; Cancer (10/01/11); Stroke; Renal calculi; and Breast cancer.    PAST SURGICAL HISTORY: Past Surgical History  Procedure Laterality Date  . Tonsillectomy  50  . Kidney stones  59/60  . Cyst of  left breast and right breast      Dr. Irena Reichmann   . Abdominal hysterectomy  1990    Dr. Colon Branch   . Dilation and curettage of uterus    . Lung cancer surgery  10/01/11  DR.BURNEY    (L)VATS,ANT. MINI THORACOTOMY, WEDGE RESECTION OF  LULOBE LESION WITH NODWE SAMPLING    FAMILY HISTORY:  Family History  Problem Relation Age of Onset  . Throat cancer Mother   . Cancer Mother     SOCIAL HISTORY:  History  Substance Use Topics  . Smoking status: Former Smoker -- 3.00 packs/day for 32 years    Types: Cigarettes  . Smokeless tobacco: Former Neurosurgeon    Quit date: 03/08/1990     Comment: smoked 3ppd from 1959-1991   . Alcohol Use: No    ALLERGIES: Contrast media; Iohexol; Sulfa antibiotics; and Sulfamethoxazole-trimethoprim  MEDICATIONS:  Current Outpatient Prescriptions  Medication Sig Dispense Refill  . acetaminophen (TYLENOL) 500 MG tablet Take 1,000 mg by mouth every 6 (six) hours as needed. For pain.      Marland Kitchen albuterol (PROAIR HFA) 108 (90 BASE) MCG/ACT inhaler USE 2 INHALATIONS EVERY 6 HOURS AS NEEDED  18 g  2  . Garlic 1000 MG CAPS Take 1 capsule by mouth 2 (two) times daily.       . hydrochlorothiazide (MICROZIDE) 12.5 MG capsule Take 2 capsules (25 mg total) by mouth daily.  180 capsule  0  . metoprolol (LOPRESSOR) 100 MG tablet Take 1 tablet (100 mg total) by mouth 2 (two) times daily.  180 tablet  0  . nystatin (MYCOSTATIN/NYSTOP) 100000 UNIT/GM POWD 3 times daily  60 g  3  . simvastatin (ZOCOR) 10 MG tablet Take 1 tablet (10 mg total) by mouth at  bedtime.  90 tablet  1  . warfarin (COUMADIN) 5 MG tablet Take 0.5-1 tablets (2.5-5 mg total) by mouth See admin instructions. 0.5 tab daily except 1 tab daily on Tuesdays and Saturdays.  90 tablet  1  . [DISCONTINUED] Calcium Citrate-Vitamin D (CALCIUM CITRATE + D) 300-100 MG-UNIT TABS Take 0.5 tablets by mouth 2 (two) times daily.        . [DISCONTINUED] metoprolol (TOPROL-XL) 100 MG 24 hr tablet Take 100 mg by mouth daily.         No current facility-administered medications for this encounter.    REVIEW OF SYSTEMS:  A 15 point review of systems is documented in the electronic medical record. This was obtained by the nursing staff. However, I reviewed this with  the patient to discuss relevant findings and make appropriate changes.  Pertinent items are noted in HPI.  PHYSICAL EXAM: There were no vitals filed for this visit.. . She is a pleasant female in no distress sitting comfortably on examining table. She has no palpable cervical supraclavicular or axillary adenopathy bilaterally. I Juliette Alcide able to palpate her right breast mass. Her left breast and right breast have some fibroglandular areas palpable in the upper outer quadrant. She has 5 out of 5 strength bilaterally. She is alert and oriented x3.  LABORATORY DATA:  Lab Results  Component Value Date   WBC 6.9 11/21/2013   HGB 14.2 11/21/2013   HCT 43.6 11/21/2013   MCV 83.7 11/21/2013   PLT 202 11/21/2013   Lab Results  Component Value Date   NA 145 11/21/2013   K 4.0 11/21/2013   CL 101 04/24/2013   CO2 28 11/21/2013   Lab Results  Component Value Date   ALT 16 11/21/2013   AST 18 11/21/2013   ALKPHOS 57 11/21/2013   BILITOT 0.55 11/21/2013     RADIOGRAPHY: [place] Core Biopsy  11/12/2013   ADDENDUM REPORT: 11/12/2013 14:51  ADDENDUM: The final pathological diagnosis is invasive ductal carcinoma. I discussed this with the patient by telephone on 11/12/2013. Her questions were answered. She reported some bruising at the biopsy site with no pain or palpable hematoma.  Sonnie Alamo, the nurse navigator at the [facility], discussed the final pathological diagnosis with Tammie [name]. The patient has been receiving surgical and oncological care for lung carcinoma in [place]. Therefore, she was given an appointment at the Multidisciplinary Breast Clinic at the Cancer Center at [facility] on 11/21/2013.   Electronically Signed   By: Gordan Payment M.D.   On: 11/12/2013 14:51   11/12/2013   CLINICAL DATA:  Right breast architectural distortion.  EXAM: US BREAST BX W LOC DEV 1ST LESION IMG BX SPEC US GUIDE*R* ; ULTRASOUND CORE BIOPSY  COMPARISON:  Previous  exams.  PROCEDURE: I met with the patient and we discussed the procedure of ultrasound-guided biopsy, including benefits and alternatives. We discussed the high likelihood of a successful procedure. We discussed the risks of the procedure including infection, bleeding, tissue injury, clip migration, and inadequate sampling. Informed written consent was given. The usual time-out protocol was performed immediately prior to the procedure.  Using sterile technique and 2% Lidocaine as local anesthetic, under direct ultrasound visualization, a 12 gauge vacuum-assisteddevice was used to perform biopsy of right breast architectural distortion 9 o'clock position using a lateral approach. At the conclusion of the procedure, a ribbon shaped tissue marker clip was deployed into the biopsy cavity. Follow-up 2-view mammogram was performed and dictated separately.  IMPRESSION: Ultrasound-guided biopsy of right breast architectural distortion. No apparent complications.  Electronically Signed: By: Annia Belt M.D. On: 11/07/2013 13:06   Mm Digital Diagnostic Unilat R  11/07/2013   CLINICAL DATA:  Status post ultrasound-guided core needle biopsy right breast architectural distortion.  EXAM: DIGITAL DIAGNOSTIC UNILATERAL RIGHT MAMMOGRAM  COMPARISON:  Previous exams.  FINDINGS: Films are performed following ultrasound guided biopsy of right breast architectural distortion. Ribbon shaped biopsy marking clip is in appropriate position 9 o'clock right breast middle depth.  IMPRESSION: Appropriate position of biopsy marking clip status post ultrasound-guided core needle biopsy.  Final Assessment: Post Procedure Mammograms for Marker Placement   Electronically Signed   By: Annia Belt M.D.   On: 11/07/2013 13:02   [place] Rt Breast Bx W Loc Dev 1st Lesion Img Bx Spec US Guide  11/12/2013   ADDENDUM REPORT: 11/12/2013 14:51  ADDENDUM: The final pathological diagnosis is invasive ductal carcinoma. I discussed this with the patient by  telephone on 11/12/2013. Her questions were answered. She reported some bruising at the biopsy site with no pain or palpable hematoma.  Sonnie Alamo, the nurse navigator at the [facility], discussed the final pathological diagnosis with Tammie [name]. The patient has been receiving surgical and oncological care for lung carcinoma in [place]. Therefore, she was given an appointment at the Multidisciplinary Breast Clinic at the Cancer Center at [facility] on 11/21/2013.   Electronically Signed   By: Gordan Payment M.D.   On: 11/12/2013 14:51   11/12/2013   CLINICAL DATA:  Right breast architectural distortion.  EXAM: US BREAST BX W LOC DEV 1ST LESION IMG BX SPEC US GUIDE*R* ; ULTRASOUND CORE BIOPSY  COMPARISON:  Previous exams.  PROCEDURE: I met with the patient and we discussed the procedure of ultrasound-guided biopsy, including benefits and alternatives. We discussed the high likelihood of a successful procedure. We discussed the risks of the procedure including infection, bleeding, tissue injury, clip migration, and inadequate sampling. Informed written consent was given. The usual time-out protocol was performed immediately prior to the procedure.  Using sterile technique and 2% Lidocaine as local anesthetic, under direct ultrasound visualization, a 12 gauge vacuum-assisteddevice was used to perform biopsy of right breast architectural distortion 9 o'clock position using a lateral approach. At the conclusion of the procedure, a ribbon shaped tissue marker clip was deployed into the biopsy cavity. Follow-up 2-view mammogram was performed and dictated separately.  IMPRESSION: Ultrasound-guided biopsy of right breast architectural distortion. No apparent complications.  Electronically Signed: By: Annia Belt M.D. On: 11/07/2013 13:06      IMPRESSION: T1 C. N0 triple negative right breast cancer  PLAN: I spoke with Ms. Petitjean today who is unaccompanied in clinic. We  discussed the role of radiation and decreasing local failures in patients who undergo lumpectomy. We discussed the process of simulation the placement tattoos. We discussed 4-6 weeks of treatment as an outpatient. We discussed the possibility of skin redness and fatigue. We discussed the equivalency in terms of survival between mastectomy and lumpectomy plus radiation. We discussed the possibility of a symptomatic lung damage. She was seen by surgery, medical oncology, a member of our patient family support staff as well as our breast cancer navigator. I will plan on seeing her after surgery is complete. I did offer her treatment in [place] then but she is more comfortable and familiar with [facility] and therefore will be treated here.  I spent 60 minutes  face to face with  the patient and more than 50% of that time was spent in counseling and/or coordination of care.   ------------------------------------------------  [name], MD

## 2013-11-21 NOTE — Telephone Encounter (Signed)
Cardiac clearance letter faxed and conformation received.

## 2013-11-21 NOTE — Progress Notes (Signed)
Checked in new patient with no financial issues. She has not been to [place] and she has her appt card., didn't ask about [facility] care [facility] packet.

## 2013-11-21 NOTE — Progress Notes (Signed)
Faxed Care Plan to Leigh at [facility]. 

## 2013-11-21 NOTE — Progress Notes (Unsigned)
Mailed after appt letter to pt. 

## 2013-11-26 ENCOUNTER — Telehealth (INDEPENDENT_AMBULATORY_CARE_PROVIDER_SITE_OTHER): Payer: Self-pay

## 2013-11-26 ENCOUNTER — Telehealth (INDEPENDENT_AMBULATORY_CARE_PROVIDER_SITE_OTHER): Payer: Self-pay | Admitting: *Deleted

## 2013-11-26 NOTE — Telephone Encounter (Signed)
I spoke with pt and informed her of her appt for physical therapy at [facility] in [place] on 11/28/13 with an arrival time of 10:00am.  Provided pt with address and phone number.  Pt is agreeable to this appt.

## 2013-11-26 NOTE — Telephone Encounter (Signed)
Message copied by Brennan Bailey on Mon Nov 26, 2013  1:58 PM ------      Message from: Marianne Sofia C      Created: Mon Nov 26, 2013  1:12 PM      Regarding: cardiac clearance       Marylou Flesher,            We saw Tammie [name] is clinic last week with Dr. Carolynne Edouard.  She needs to get off coumadin.  Her cardiologist is Dr. Caprice Red.  She is anxious to get her surgery over with.  I did explain we had to wait for her to get off coumadin first.            Thanks,      Dawn ------

## 2013-11-26 NOTE — Telephone Encounter (Signed)
Called pt and gave her appt to see Dr Alben Spittle on Wed 12/3 at 2:20 for cardiac clearance.

## 2013-11-28 ENCOUNTER — Ambulatory Visit: Payer: Medicare Other | Attending: General Surgery | Admitting: Physical Therapy

## 2013-11-28 ENCOUNTER — Encounter: Payer: Self-pay | Admitting: Cardiology

## 2013-11-28 ENCOUNTER — Ambulatory Visit: Payer: Medicare Other | Admitting: Physician Assistant

## 2013-11-28 ENCOUNTER — Ambulatory Visit (INDEPENDENT_AMBULATORY_CARE_PROVIDER_SITE_OTHER): Payer: Medicare Other | Admitting: Cardiology

## 2013-11-28 VITALS — BP 156/85 | HR 80 | Ht 71.0 in | Wt 285.0 lb

## 2013-11-28 DIAGNOSIS — C50919 Malignant neoplasm of unspecified site of unspecified female breast: Secondary | ICD-10-CM | POA: Insufficient documentation

## 2013-11-28 DIAGNOSIS — I4891 Unspecified atrial fibrillation: Secondary | ICD-10-CM

## 2013-11-28 DIAGNOSIS — IMO0001 Reserved for inherently not codable concepts without codable children: Secondary | ICD-10-CM | POA: Insufficient documentation

## 2013-11-28 DIAGNOSIS — M549 Dorsalgia, unspecified: Secondary | ICD-10-CM | POA: Insufficient documentation

## 2013-11-28 DIAGNOSIS — R293 Abnormal posture: Secondary | ICD-10-CM | POA: Insufficient documentation

## 2013-11-28 DIAGNOSIS — R29898 Other symptoms and signs involving the musculoskeletal system: Secondary | ICD-10-CM | POA: Insufficient documentation

## 2013-11-28 NOTE — Progress Notes (Signed)
HPI The patient presents for preoperative clearance. She is to have breast lumpectomy.   She is also going apparently had a short course of radiation. He has atrial fibrillation chronically. She has tolerated this rhythm with rate control and anticoagulation. She has had no prior history of coronary artery disease. She has had lung cancer with resection and has had dyspnea since that time. She says she does have lower extremity weakness and is currently getting physical therapy. She was able to stand 10 minutes on the exercise bike today without any chest pressure, neck or arm discomfort. She does get her chronic dyspnea which has been present since lung surgery. She does not get PND or orthopnea. She has some mild lower extremity swelling. She has had a rash apparently with staph infection it has been difficult to heal.  Allergies  Allergen Reactions  . Contrast Media [Iodinated Diagnostic Agents]   . Iohexol      Code: HIVES, Desc: hives w/ contrast '08// better w/ benadryl   . Sulfa Antibiotics   . Sulfamethoxazole-Trimethoprim     Current Outpatient Prescriptions  Medication Sig Dispense Refill  . acetaminophen (TYLENOL) 500 MG tablet Take 1,000 mg by mouth every 6 (six) hours as needed. For pain.      Marland Kitchen albuterol (PROAIR HFA) 108 (90 BASE) MCG/ACT inhaler USE 2 INHALATIONS EVERY 6 HOURS AS NEEDED  18 g  2  . Garlic 1000 MG CAPS Take 1 capsule by mouth 2 (two) times daily.       . hydrochlorothiazide (MICROZIDE) 12.5 MG capsule Take 2 capsules (25 mg total) by mouth daily.  180 capsule  0  . lactobacillus acidophilus (BACID) TABS tablet Take 2 tablets by mouth 3 (three) times daily.      . metoprolol (LOPRESSOR) 100 MG tablet Take 1 tablet (100 mg total) by mouth 2 (two) times daily.  180 tablet  0  . simvastatin (ZOCOR) 10 MG tablet Take 1 tablet (10 mg total) by mouth at bedtime.  90 tablet  1  . warfarin (COUMADIN) 5 MG tablet Take 0.5-1 tablets (2.5-5 mg total) by mouth See admin  instructions. 0.5 tab daily except 1 tab daily on Tuesdays and Saturdays.  90 tablet  1  . [DISCONTINUED] Calcium Citrate-Vitamin D (CALCIUM CITRATE + D) 300-100 MG-UNIT TABS Take 0.5 tablets by mouth 2 (two) times daily.        . [DISCONTINUED] metoprolol (TOPROL-XL) 100 MG 24 hr tablet Take 100 mg by mouth daily.         No current facility-administered medications for this visit.    Past Medical History  Diagnosis Date  . Asthma   . Fibrocystic disease of breast   . Mini stroke     x2. Dr. Jefm Miles  / Dr. Lenora Boys   . Hematuria     Dr. Porfirio Mylar    . Renal calculi   . Hyperlipidemia   . Obesity   . Atrial fibrillation     on coumadin   . Hypertension   . Arthritis   . Seasonal allergies   . Nephrolithiasis   . Cystic disease of breast   . 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 Stroke   . Renal calculi   . Breast cancer     Past Surgical History  Procedure Laterality Date  . Tonsillectomy  50  . Kidney stones  [age]  . Cyst of  left breast and right breast      Dr. Irena Reichmann   .  Abdominal hysterectomy  1990    Dr. Colon Branch   . Dilation and curettage of uterus    . Lung cancer surgery  10/01/11  DR.BURNEY    (L)VATS,ANT. MINI THORACOTOMY, WEDGE RESECTION OF LULOBE LESION WITH NODWE SAMPLING    ROS: Positive for leg weakness.  Otherwise as stated in the HPI and negative for all other systems.  PHYSICAL EXAM BP 156/85  Pulse 80  Ht 5\' 11"  (1.803 m)  Wt 285 lb (129.275 kg)  BMI 39.77 kg/m2 GENERAL:  Well appearing HEENT:  Pupils equal round and reactive, fundi not visualized, oral mucosa unremarkable, upper dentures.  NECK:  No jugular venous distention, waveform within normal limits, carotid upstroke brisk and symmetric, no bruits, no thyromegaly LYMPHATICS:  No cervical, inguinal adenopathy LUNGS:  Clear to auscultation bilaterally BACK:  No CVA tenderness CHEST:  Unremarkable HEART:  PMI not displaced or sustained,S1 and S2 within normal limits, no S3, no  clicks, no rubs, no murmurs, irregular ABD:  Flat, positive bowel sounds normal in frequency in pitch, no bruits, no rebound, no guarding, no midline pulsatile mass, no hepatomegaly, no splenomegaly EXT:  2 plus pulses throughout, mildedema, no cyanosis no clubbing NEURO:  Cranial nerves II through XII grossly intact, motor grossly intact throughout PSYCH:  Cognitively intact, oriented to person place and time   EKG:   Atrial fibrillation, rate 75, axis within normal limits, intervals within normal limits, no acute ST-T wave changes.  11/28/2013   ASSESSMENT AND PLAN  PREOP:   The patient is going for low-risk procedure. She has a high functional status. There are no high-risk findings or symptoms. Therefore, based on ACC/AHA guidelines, the patient would be at acceptable risk for the planned procedure without further cardiovascular testing.  ATRIAL FIB:    Given her previous stroke her risks of thromboembolic events is high. She will need bridging Lovenox when her anticoagulation is held for the surgery. I will send this note to her primary providers to arrange this. The patient has given herself Lovenox injections previously.  HTN:  The blood pressure is slightly high.   However it has been at target on previous visits. She eventually needs to lose weight to further control this.. No change in medications is indicated. We will continue with therapeutic lifestyle changes (TLC).

## 2013-11-28 NOTE — Patient Instructions (Signed)
The current medical regimen is effective;  continue present plan and medications.  You have been cleared for surgery but will need Lovenox injections while off Coumadin.  This should be handled thru [facility].  Follow up in 18 months with Dr Antoine Poche.  You will receive a letter in the mail 2 months before you are due.  Please call us when you receive this letter to schedule your follow up appointment.

## 2013-11-29 ENCOUNTER — Ambulatory Visit: Payer: Medicare Other | Admitting: Physical Therapy

## 2013-12-03 ENCOUNTER — Ambulatory Visit (INDEPENDENT_AMBULATORY_CARE_PROVIDER_SITE_OTHER): Payer: Medicare Other | Admitting: Pharmacist

## 2013-12-03 DIAGNOSIS — I4891 Unspecified atrial fibrillation: Secondary | ICD-10-CM

## 2013-12-03 NOTE — Patient Instructions (Signed)
Anticoagulation Dose Instructions as of 12/03/2013     Tammie [name] Tue Wed Thu Fri Sat   New Dose 2.5 mg Hold 2.5 mg 2.5 mg 2.5 mg 2.5 mg 2.5 mg    Description       Take 1/2 tablet today (monday 12/03/13) then restart regular dose of 1/2 tablet daily except none on Mondays.       INR was 1.9 today

## 2013-12-04 ENCOUNTER — Ambulatory Visit: Payer: Medicare Other | Admitting: Physical Therapy

## 2013-12-04 ENCOUNTER — Other Ambulatory Visit (INDEPENDENT_AMBULATORY_CARE_PROVIDER_SITE_OTHER): Payer: Self-pay | Admitting: General Surgery

## 2013-12-04 DIAGNOSIS — C50911 Malignant neoplasm of unspecified site of right female breast: Secondary | ICD-10-CM

## 2013-12-05 ENCOUNTER — Telehealth (INDEPENDENT_AMBULATORY_CARE_PROVIDER_SITE_OTHER): Payer: Self-pay

## 2013-12-05 ENCOUNTER — Telehealth: Payer: Self-pay | Admitting: Nurse Practitioner

## 2013-12-05 ENCOUNTER — Encounter (HOSPITAL_COMMUNITY): Payer: Self-pay

## 2013-12-05 MED ORDER — ENOXAPARIN SODIUM 120 MG/0.8ML ~~LOC~~ SOLN: 120.0000 mg | Freq: Two times a day (BID) | SUBCUTANEOUS | Status: DC

## 2013-12-05 NOTE — Telephone Encounter (Signed)
Message copied by Brennan Bailey on Wed Dec 05, 2013 10:28 AM ------      Message from: DODD, Hermina Barters      Created: Mon Dec 03, 2013  5:38 PM      Regarding: lovenox bridge for lumpectomy       [name] Clinical Pharmacist at [facility] is wanting to know the date for lumpectomy so they can manage her coumadin with a lovenox bridge.            Please let [name] know date of surgery - her number is      [phone] ext [phone].            Thanks - Vikki Ports ------

## 2013-12-05 NOTE — Telephone Encounter (Signed)
LMOM> Called [name] back regarding patients surgery date. She is scheduled for surgery on 12/22. Please manage her lovenox bridge accordingly. Thanks.

## 2013-12-05 NOTE — Telephone Encounter (Signed)
Marcelino Duster at [facility] / Dr Carolynne Edouard also called - [phone].

## 2013-12-06 ENCOUNTER — Ambulatory Visit: Payer: Medicare Other | Admitting: Physical Therapy

## 2013-12-06 NOTE — Telephone Encounter (Signed)
[facility]'s office number is actually [phone]

## 2013-12-06 NOTE — Telephone Encounter (Signed)
Copy of lovenox bridging plan faxed to Dr Billey Chang office [phone]

## 2013-12-11 ENCOUNTER — Ambulatory Visit (INDEPENDENT_AMBULATORY_CARE_PROVIDER_SITE_OTHER): Payer: Medicare Other | Admitting: General Practice

## 2013-12-11 ENCOUNTER — Encounter: Payer: Self-pay | Admitting: General Practice

## 2013-12-11 ENCOUNTER — Ambulatory Visit: Payer: Medicare Other | Admitting: Physical Therapy

## 2013-12-11 VITALS — BP 150/76 | HR 87 | Temp 99.2°F | Ht 71.0 in | Wt 280.0 lb

## 2013-12-11 DIAGNOSIS — R21 Rash and other nonspecific skin eruption: Secondary | ICD-10-CM

## 2013-12-11 DIAGNOSIS — B3789 Other sites of candidiasis: Secondary | ICD-10-CM

## 2013-12-11 MED ORDER — FLUCONAZOLE 150 MG PO TABS: 150.0000 mg | ORAL_TABLET | Freq: Once | ORAL | Status: DC

## 2013-12-11 NOTE — Patient Instructions (Addendum)
Hand Washing Staying healthy is important to you and your entire family. Follow these easy, low-cost steps to help stop many infectious diseases before they happen. HOW TO WASH  Wet your hands and apply liquid, bar, or powder soap.  Rub hands together vigorously to make a lather and scrub all surfaces. Be sure to clean between the fingers and around the nails.  Continue for 20 seconds! It takes that long for the soap and scrubbing action to dislodge and remove stubborn germs.  Rinse hands well under running water.  Dry your hands using a paper towel or air dryer.  If possible, use your paper towel or elbow to turn off the faucet. This will help avoid re-exposure to germs on the handle. WHEN TO WASH YOUR HANDS  Before and after eating.  Before, during, and after handling or preparing food.  After contact with blood or body fluids (like vomit, nasal secretions, or saliva). This means washing after you blow your nose!  Before and after changing a diaper.  After you use the bathroom.  After handling animals, their toys, leashes, or waste.  After touching something that could be contaminated (such as a trash can, cleaning cloth, drain, or soil).  Before and after taking care of (dressing) a wound, giving medicine, or inserting contact lenses.  More often when someone in your home is sick.  Whenever your hands become soiled. If soap and water are not available, use an alcohol-based wipe or hand gel. Keeping your hands clean is one of the best ways to keep from getting sick and spreading illnesses. Cleaning your hands gets rid of germs you pick up:  From other people.  From the surfaces you touch.  From the animals you come in contact with. Document Released: 08/03/2005 Document Revised: 04/09/2013 Document Reviewed: 01/08/2009 Exit[employer] Patient Information 2014 [employer].  

## 2013-12-11 NOTE — Progress Notes (Signed)
   Subjective:    Patient ID: Tammie [name], female    DOB: [birth year], 72 y.o.   MRN: [id number]  HPI Patient presents today for unresolved yeast infection underneath breasts. Onset was 6 months ago and gradually worsening. She has been treated ketoconazol cream and treatment was ineffective. Denies being seen by a dermatologist. She is scheduled to have a surgical procedure on Monday, preop schedule for tomorrow.     Review of Systems  Constitutional: Negative for fever and chills.  Respiratory: Negative for chest tightness and shortness of breath.   Cardiovascular: Negative for chest pain and palpitations.  Skin: Positive for rash.       Rash underneath bilateral breast        Objective:   Physical Exam  Constitutional: She is oriented to person, place, and time. She appears well-developed and well-nourished.  Cardiovascular: Normal rate, regular rhythm and normal heart sounds.   Pulmonary/Chest: Effort normal. No respiratory distress.  Neurological: She is alert and oriented to person, place, and time.  Skin: Skin is warm and dry. Rash noted. There is erythema.  Erythematous, maculopapular rash underneath bilateral breast and areola. Negative drainage.   Psychiatric: She has a normal mood and affect.          Assessment & Plan:  1. Candidiasis of breast  - fluconazole (DIFLUCAN) 150 MG tablet; Take 1 tablet (150 mg total) by mouth once. May repeat in 72 hours  Dispense: 3 tablet; Refill: 0  2. Rash and nonspecific skin eruption - Ambulatory referral to Dermatology -keep skin clean and dry -RTO if symptoms worsen, prior to dermatology visit Patient verbalized understanding Coralie Keens, FNP-C

## 2013-12-12 ENCOUNTER — Encounter (HOSPITAL_COMMUNITY)
Admission: RE | Admit: 2013-12-12 | Discharge: 2013-12-12 | Disposition: A | Discharge status: Home / Self Care | Payer: Medicare Other | Source: Ambulatory Visit | Attending: General Surgery | Admitting: General Surgery

## 2013-12-12 ENCOUNTER — Encounter (HOSPITAL_COMMUNITY): Payer: Self-pay

## 2013-12-12 ENCOUNTER — Other Ambulatory Visit (HOSPITAL_COMMUNITY): Payer: Self-pay | Admitting: *Deleted

## 2013-12-12 DIAGNOSIS — Z01812 Encounter for preprocedural laboratory examination: Secondary | ICD-10-CM | POA: Diagnosis present

## 2013-12-12 HISTORY — DX: Calculus of gallbladder without cholecystitis without obstruction: K80.20

## 2013-12-12 HISTORY — DX: Anemia, unspecified: D64.9

## 2013-12-12 HISTORY — DX: Accidental discharge from unspecified firearms or gun, initial encounter: W34.00XA

## 2013-12-12 HISTORY — DX: Gastro-esophageal reflux disease without esophagitis: K21.9

## 2013-12-12 HISTORY — DX: Puncture wound without foreign body of right shoulder, initial encounter: S41.031A

## 2013-12-12 HISTORY — DX: Pneumonia, unspecified organism: J18.9

## 2013-12-12 LAB — PROTIME-INR: Prothrombin Time: 17.5 seconds — ABNORMAL HIGH (ref 11.6–15.2)

## 2013-12-12 LAB — CBC
Hemoglobin: 13.8 g/dL (ref 12.0–15.0)
MCHC: 32.3 g/dL (ref 30.0–36.0)
RBC: 5.07 MIL/uL (ref 3.87–5.11)

## 2013-12-12 LAB — APTT: aPTT: 44 seconds — ABNORMAL HIGH (ref 24–37)

## 2013-12-12 MED ORDER — CHLORHEXIDINE GLUCONATE 4 % EX LIQD: 1.0000 "application " | Freq: Once | CUTANEOUS | Status: DC

## 2013-12-12 NOTE — Progress Notes (Signed)
Pt has been instructed by pharmacist at [facility] to take Lovenox, Sunday (12/16/13) and then not take again until after surgery. Pt is concerned about the dosage of Coumadin and Lovenox that has been ordered for her to take after surgery. I encouraged her to call the pharmacist and talk with her.  Pt is also concerned about rash on breasts, being treated for a yeast infection. States it looks better after starting Diflucan yesterday, to take 2 more doses prior to surgery.

## 2013-12-12 NOTE — Pre-Procedure Instructions (Signed)
JAKERA BEAUPRE  12/12/2013   Your procedure is scheduled on:  Monday, December 17, 2013 at 10:30 AM.   Report to [facility] Entrance "A" Admitting Office at 8:30 AM.   Call this number if you have problems the morning of surgery: [phone]   Remember:   Do not eat food or drink liquids after midnight Sunday, 12/16/13.   Take these medicines the morning of surgery with A SIP OF WATER: metoprolol (LOPRESSOR), acetaminophen (TYLENOL) - if needed, albuterol (PROVENTIL HFA;VENTOLIN HFA) inhaler - if needed - please bring with you day of surgery  Stop all herbal medications as of today.     Do not wear jewelry, make-up or nail polish.  Do not wear lotions, powders, or perfumes. You may wear deodorant.  Do not shave 48 hours prior to surgery.   Do not bring valuables to the hospital.  [facility] is not responsible                  for any belongings or valuables.               Contacts, dentures or bridgework may not be worn into surgery.  Leave suitcase in the car. After surgery it may be brought to your room.  For patients admitted to the hospital, discharge time is determined by your                treatment team.               Patients discharged the day of surgery will not be allowed to drive  home.  Name and phone number of your driver: Family/friend   Special Instructions: Shower using CHG 2 nights before surgery and the night before surgery.  If you shower the day of surgery use CHG.  Use special wash - you have one bottle of CHG for all showers.  You should use approximately 1/3 of the bottle for each shower.   Please read over the following fact sheets that you were given: Pain Booklet, Coughing and Deep Breathing and Surgical Site Infection Prevention

## 2013-12-12 NOTE — Progress Notes (Signed)
12/12/13 1134  OBSTRUCTIVE SLEEP APNEA  Have you ever been diagnosed with sleep apnea through a sleep study? No  Do you snore loudly (loud enough to be heard through closed doors)?  0  Do you often feel tired, fatigued, or sleepy during the daytime? 1  Has anyone observed you stop breathing during your sleep? 0  Do you have, or are you being treated for high blood pressure? 1  BMI more than 35 kg/m2? 1  Age over 72 years old? 1  Neck circumference greater than 40 cm/18 inches? 0 (17 inches)  Gender: 0  Obstructive Sleep Apnea Score 4  Score 4 or greater  Results sent to PCP

## 2013-12-13 ENCOUNTER — Telehealth (INDEPENDENT_AMBULATORY_CARE_PROVIDER_SITE_OTHER): Payer: Self-pay

## 2013-12-13 ENCOUNTER — Telehealth: Payer: Self-pay | Admitting: Nurse Practitioner

## 2013-12-13 ENCOUNTER — Ambulatory Visit: Payer: Medicare Other | Admitting: Physical Therapy

## 2013-12-13 LAB — BASIC METABOLIC PANEL
BUN: 20 mg/dL (ref 6–23)
CO2: 26 mEq/L (ref 19–32)
Calcium: 9.5 mg/dL (ref 8.4–10.5)
Chloride: 104 mEq/L (ref 96–112)
Creatinine, Ser: 0.84 mg/dL (ref 0.50–1.10)
GFR calc Af Amer: 79 mL/min — ABNORMAL LOW (ref >90)
GFR calc non Af Amer: 68 mL/min — ABNORMAL LOW (ref >90)
Glucose, Bld: 97 mg/dL (ref 70–99)
Potassium: 4 mEq/L (ref 3.5–5.1)
Sodium: 141 mEq/L (ref 135–145)
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
## 2013-12-13 NOTE — Telephone Encounter (Signed)
Patient calls with question about lovenox and warfarin bridging after breast surgery.  Questions addressed.

## 2013-12-13 NOTE — Telephone Encounter (Signed)
Revonda Standard at [facility] called stating pts INR and PTT slightly elevated. Pt is a coumadin pt but is on lovenox bridge for surgery on Monday. She wanted to know if Dr Carolynne Edouard wanted stat lab repeat on Monday prior to surgery. Revonda Standard advised since Dr Carolynne Edouard is off I will print labs and give them to Marcelino Duster to review with our Urgent office MD. Revonda Standard can be reached at [phone].

## 2013-12-13 NOTE — Telephone Encounter (Signed)
Reviewed with Dr Luisa Hart. He says to order repeat INR and PTT day of surgery. Left message on Allison's VM with this information.

## 2013-12-16 MED ORDER — CEFAZOLIN SODIUM 10 G IJ SOLR
3.0000 g | INTRAMUSCULAR | Status: AC
  Administered 2013-12-17: 3 g via INTRAVENOUS
  Filled 2013-12-16: qty 3000

## 2013-12-17 ENCOUNTER — Encounter (HOSPITAL_COMMUNITY)
Admission: RE | Disposition: A | Discharge status: Home / Self Care | Payer: Self-pay | Source: Ambulatory Visit | Attending: General Surgery

## 2013-12-17 ENCOUNTER — Ambulatory Visit (HOSPITAL_COMMUNITY)
Admission: RE | Admit: 2013-12-17 | Discharge: 2013-12-17 | Disposition: A | Discharge status: Home / Self Care | Payer: Medicare Other | Source: Ambulatory Visit | Attending: General Surgery | Admitting: General Surgery

## 2013-12-17 ENCOUNTER — Encounter (HOSPITAL_COMMUNITY)
Admission: RE | Admit: 2013-12-17 | Discharge: 2013-12-17 | Disposition: A | Discharge status: Home / Self Care | Payer: Medicare Other | Source: Ambulatory Visit | Attending: General Surgery | Admitting: General Surgery

## 2013-12-17 ENCOUNTER — Ambulatory Visit (HOSPITAL_COMMUNITY): Payer: Medicare Other | Admitting: Certified Registered"

## 2013-12-17 ENCOUNTER — Ambulatory Visit
Admission: RE | Admit: 2013-12-17 | Discharge: 2013-12-17 | Disposition: A | Discharge status: Home / Self Care | Payer: Medicare Other | Source: Ambulatory Visit | Attending: General Surgery | Admitting: General Surgery

## 2013-12-17 ENCOUNTER — Encounter (HOSPITAL_COMMUNITY): Payer: Self-pay | Admitting: *Deleted

## 2013-12-17 ENCOUNTER — Encounter (HOSPITAL_COMMUNITY): Payer: Medicare Other | Admitting: Vascular Surgery

## 2013-12-17 ENCOUNTER — Ambulatory Visit (HOSPITAL_COMMUNITY): Payer: Medicare Other

## 2013-12-17 DIAGNOSIS — C50911 Malignant neoplasm of unspecified site of right female breast: Secondary | ICD-10-CM

## 2013-12-17 DIAGNOSIS — I10 Essential (primary) hypertension: Secondary | ICD-10-CM | POA: Insufficient documentation

## 2013-12-17 DIAGNOSIS — C50919 Malignant neoplasm of unspecified site of unspecified female breast: Secondary | ICD-10-CM | POA: Insufficient documentation

## 2013-12-17 DIAGNOSIS — C50419 Malignant neoplasm of upper-outer quadrant of unspecified female breast: Secondary | ICD-10-CM | POA: Insufficient documentation

## 2013-12-17 DIAGNOSIS — C50411 Malignant neoplasm of upper-outer quadrant of right female breast: Secondary | ICD-10-CM

## 2013-12-17 DIAGNOSIS — D0590 Unspecified type of carcinoma in situ of unspecified breast: Secondary | ICD-10-CM | POA: Insufficient documentation

## 2013-12-17 DIAGNOSIS — I4891 Unspecified atrial fibrillation: Secondary | ICD-10-CM | POA: Insufficient documentation

## 2013-12-17 DIAGNOSIS — Z7901 Long term (current) use of anticoagulants: Secondary | ICD-10-CM | POA: Insufficient documentation

## 2013-12-17 DIAGNOSIS — J449 Chronic obstructive pulmonary disease, unspecified: Secondary | ICD-10-CM | POA: Insufficient documentation

## 2013-12-17 DIAGNOSIS — J4489 Other specified chronic obstructive pulmonary disease: Secondary | ICD-10-CM | POA: Insufficient documentation

## 2013-12-17 DIAGNOSIS — Z87891 Personal history of nicotine dependence: Secondary | ICD-10-CM | POA: Insufficient documentation

## 2013-12-17 DIAGNOSIS — Z85118 Personal history of other malignant neoplasm of bronchus and lung: Secondary | ICD-10-CM | POA: Insufficient documentation

## 2013-12-17 HISTORY — PX: BREAST LUMPECTOMY WITH NEEDLE LOCALIZATION AND AXILLARY SENTINEL LYMPH NODE BX: SHX5760

## 2013-12-17 SURGERY — BREAST LUMPECTOMY WITH NEEDLE LOCALIZATION AND AXILLARY SENTINEL LYMPH NODE BX
Anesthesia: General | Laterality: Right

## 2013-12-17 MED ORDER — HYDROMORPHONE HCL PF 1 MG/ML IJ SOLN
0.2500 mg | INTRAMUSCULAR | Status: DC | PRN
  Administered 2013-12-17 (×2): 0.5 mg via INTRAVENOUS

## 2013-12-17 MED ORDER — OXYCODONE-ACETAMINOPHEN 5-325 MG PO TABS: 1.0000 | ORAL_TABLET | ORAL | Status: DC | PRN

## 2013-12-17 MED ORDER — FENTANYL CITRATE 0.05 MG/ML IJ SOLN
INTRAMUSCULAR | Status: DC | PRN
  Administered 2013-12-17 (×3): 50 ug via INTRAVENOUS

## 2013-12-17 MED ORDER — PROMETHAZINE HCL 25 MG/ML IJ SOLN: 6.2500 mg | INTRAMUSCULAR | Status: DC | PRN

## 2013-12-17 MED ORDER — SODIUM CHLORIDE 0.9 % IJ SOLN
INTRAMUSCULAR | Status: DC | PRN
  Administered 2013-12-17: 11:00:00

## 2013-12-17 MED ORDER — METHYLENE BLUE 1 % INJ SOLN
INTRAMUSCULAR | Status: AC
  Filled 2013-12-17: qty 10

## 2013-12-17 MED ORDER — BUPIVACAINE-EPINEPHRINE PF 0.25-1:200000 % IJ SOLN
INTRAMUSCULAR | Status: AC
  Filled 2013-12-17: qty 30

## 2013-12-17 MED ORDER — HYDROMORPHONE HCL PF 1 MG/ML IJ SOLN
INTRAMUSCULAR | Status: AC
  Filled 2013-12-17: qty 1

## 2013-12-17 MED ORDER — SODIUM CHLORIDE 0.9 % IJ SOLN
INTRAMUSCULAR | Status: AC
  Filled 2013-12-17: qty 10

## 2013-12-17 MED ORDER — TECHNETIUM TC 99M SULFUR COLLOID FILTERED: 1.0000 | Freq: Once | INTRAVENOUS | Status: AC | PRN

## 2013-12-17 MED ORDER — LACTATED RINGERS IV SOLN
INTRAVENOUS | Status: DC
  Administered 2013-12-17: 10:00:00 via INTRAVENOUS

## 2013-12-17 MED ORDER — LIDOCAINE HCL (CARDIAC) 20 MG/ML IV SOLN
INTRAVENOUS | Status: DC | PRN
  Administered 2013-12-17: 40 mg via INTRAVENOUS

## 2013-12-17 MED ORDER — 0.9 % SODIUM CHLORIDE (POUR BTL) OPTIME
TOPICAL | Status: DC | PRN
  Administered 2013-12-17: 1000 mL

## 2013-12-17 MED ORDER — ONDANSETRON HCL 4 MG/2ML IJ SOLN
INTRAMUSCULAR | Status: DC | PRN
  Administered 2013-12-17: 4 mg via INTRAVENOUS

## 2013-12-17 MED ORDER — FENTANYL CITRATE 0.05 MG/ML IJ SOLN
INTRAMUSCULAR | Status: AC
  Filled 2013-12-17: qty 2

## 2013-12-17 MED ORDER — MIDAZOLAM HCL 2 MG/2ML IJ SOLN
2.0000 mg | Freq: Once | INTRAMUSCULAR | Status: AC
  Administered 2013-12-17: 2 mg via INTRAVENOUS

## 2013-12-17 MED ORDER — OXYCODONE HCL 5 MG PO TABS
5.0000 mg | ORAL_TABLET | Freq: Once | ORAL | Status: AC | PRN
  Administered 2013-12-17: 5 mg via ORAL

## 2013-12-17 MED ORDER — BUPIVACAINE HCL (PF) 0.25 % IJ SOLN
INTRAMUSCULAR | Status: AC
  Filled 2013-12-17: qty 30

## 2013-12-17 MED ORDER — BUPIVACAINE-EPINEPHRINE 0.25% -1:200000 IJ SOLN
INTRAMUSCULAR | Status: DC | PRN
  Administered 2013-12-17: 30 mL

## 2013-12-17 MED ORDER — OXYCODONE HCL 5 MG PO TABS
ORAL_TABLET | ORAL | Status: AC
  Filled 2013-12-17: qty 1

## 2013-12-17 MED ORDER — FENTANYL CITRATE 0.05 MG/ML IJ SOLN
100.0000 ug | Freq: Once | INTRAMUSCULAR | Status: AC
  Administered 2013-12-17: 100 ug via INTRAVENOUS

## 2013-12-17 MED ORDER — MIDAZOLAM HCL 2 MG/2ML IJ SOLN
INTRAMUSCULAR | Status: AC
  Filled 2013-12-17: qty 2

## 2013-12-17 MED ORDER — OXYCODONE HCL 5 MG/5ML PO SOLN: 5.0000 mg | Freq: Once | ORAL | Status: AC | PRN

## 2013-12-17 MED ORDER — PROPOFOL 10 MG/ML IV BOLUS
INTRAVENOUS | Status: DC | PRN
  Administered 2013-12-17: 180 mg via INTRAVENOUS

## 2013-12-17 MED ORDER — PHENYLEPHRINE HCL 10 MG/ML IJ SOLN
10.0000 mg | INTRAVENOUS | Status: DC | PRN
  Administered 2013-12-17: 20 ug/min via INTRAVENOUS

## 2013-12-17 SURGICAL SUPPLY — 50 items
APPLIER CLIP 9.375 MED OPEN (MISCELLANEOUS) ×2
BINDER BREAST LRG (GAUZE/BANDAGES/DRESSINGS) IMPLANT
BINDER BREAST XLRG (GAUZE/BANDAGES/DRESSINGS) IMPLANT
BLADE SURG 10 STRL SS (BLADE) ×2 IMPLANT
BLADE SURG 15 STRL LF DISP TIS (BLADE) ×1 IMPLANT
BLADE SURG 15 STRL SS (BLADE) ×1
CANISTER SUCTION 2500CC (MISCELLANEOUS) ×2 IMPLANT
CHLORAPREP W/TINT 26ML (MISCELLANEOUS) ×2 IMPLANT
CLIP APPLIE 9.375 MED OPEN (MISCELLANEOUS) ×1 IMPLANT
CONT SPEC 4OZ CLIKSEAL STRL BL (MISCELLANEOUS) ×6 IMPLANT
COVER PROBE W GEL 5X96 (DRAPES) ×2 IMPLANT
COVER SURGICAL LIGHT HANDLE (MISCELLANEOUS) ×2 IMPLANT
DERMABOND ADVANCED (GAUZE/BANDAGES/DRESSINGS) ×2
DERMABOND ADVANCED .7 DNX12 (GAUZE/BANDAGES/DRESSINGS) ×2 IMPLANT
DEVICE DUBIN SPECIMEN MAMMOGRA (MISCELLANEOUS) ×2 IMPLANT
DRAPE CHEST BREAST 15X10 FENES (DRAPES) ×2 IMPLANT
DRAPE UTILITY 15X26 W/TAPE STR (DRAPE) ×4 IMPLANT
ELECT COATED BLADE 2.86 ST (ELECTRODE) ×2 IMPLANT
ELECT REM PT RETURN 9FT ADLT (ELECTROSURGICAL) ×2
ELECTRODE REM PT RTRN 9FT ADLT (ELECTROSURGICAL) ×1 IMPLANT
GLOVE BIO SURGEON STRL SZ 6.5 (GLOVE) ×2 IMPLANT
GLOVE BIO SURGEON STRL SZ7.5 (GLOVE) ×4 IMPLANT
GLOVE BIOGEL PI IND STRL 6.5 (GLOVE) ×2 IMPLANT
GLOVE BIOGEL PI IND STRL 7.0 (GLOVE) ×1 IMPLANT
GLOVE BIOGEL PI INDICATOR 6.5 (GLOVE) ×2
GLOVE BIOGEL PI INDICATOR 7.0 (GLOVE) ×1
GLOVE ECLIPSE 6.5 STRL STRAW (GLOVE) ×4 IMPLANT
GOWN PREVENTION PLUS XLARGE (GOWN DISPOSABLE) ×2 IMPLANT
GOWN STRL NON-REIN LRG LVL3 (GOWN DISPOSABLE) ×4 IMPLANT
KIT BASIN OR (CUSTOM PROCEDURE TRAY) ×2 IMPLANT
KIT MARKER MARGIN INK (KITS) ×2 IMPLANT
KIT ROOM TURNOVER OR (KITS) ×2 IMPLANT
NEEDLE 18GX1X1/2 (RX/OR ONLY) (NEEDLE) ×2 IMPLANT
NEEDLE HYPO 25GX1X1/2 BEV (NEEDLE) ×4 IMPLANT
NS IRRIG 1000ML POUR BTL (IV SOLUTION) ×2 IMPLANT
PACK SURGICAL SETUP 50X90 (CUSTOM PROCEDURE TRAY) ×2 IMPLANT
PAD ARMBOARD 7.5X6 YLW CONV (MISCELLANEOUS) ×2 IMPLANT
PENCIL BUTTON HOLSTER BLD 10FT (ELECTRODE) ×2 IMPLANT
SPONGE LAP 18X18 X RAY DECT (DISPOSABLE) ×4 IMPLANT
SUT MNCRL AB 4-0 PS2 18 (SUTURE) ×4 IMPLANT
SUT SILK 2 0 SH (SUTURE) IMPLANT
SUT VIC AB 3-0 54X BRD REEL (SUTURE) ×1 IMPLANT
SUT VIC AB 3-0 BRD 54 (SUTURE) ×1
SUT VIC AB 3-0 SH 18 (SUTURE) ×4 IMPLANT
SYR BULB 3OZ (MISCELLANEOUS) ×2 IMPLANT
SYR CONTROL 10ML LL (SYRINGE) ×4 IMPLANT
TOWEL OR 17X24 6PK STRL BLUE (TOWEL DISPOSABLE) ×2 IMPLANT
TOWEL OR 17X26 10 PK STRL BLUE (TOWEL DISPOSABLE) ×2 IMPLANT
TUBE CONNECTING 12X1/4 (SUCTIONS) ×2 IMPLANT
YANKAUER SUCT BULB TIP NO VENT (SUCTIONS) ×2 IMPLANT

## 2013-12-17 NOTE — H&P (View-Only) (Signed)
Patient ID: Tammie [name], female   DOB: 27-Jun-1941, 72 y.o.   MRN: [id number]  No chief complaint on file.   HPI [name] is a 72 y.o. female.  We are asked to see the patient in consultation by Dr. Deboraha Sprang to evaluate her for a Right breast cancer. The patient is a 72 year old white female who recently developed a rash underneath both breasts. This became red and inflamed. As part of her evaluation she underwent mammogram and ultrasound. This revealed a 1.5 cm mass in the lateral aspect of the right breast. This was biopsied and came back as a grade 1 breast cancer. She was triple negative. She denies any breast pain. She denies any discharge from her nipple. HPI  Past Medical History  Diagnosis Date  . Asthma   . Fibrocystic disease of breast   . Mini stroke     x2. Dr. Jefm Miles  / Dr. Lenora Boys   . Hematuria     Dr. Porfirio Mylar    . Renal calculi   . Hyperlipidemia   . Obesity   . Atrial fibrillation     on coumadin   . Hypertension   . Arthritis   . Seasonal allergies   . Nephrolithiasis   . Cystic disease of breast   . 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 Stroke   . Renal calculi   . Breast cancer     Past Surgical History  Procedure Laterality Date  . Tonsillectomy  50  . Kidney stones  [age]  . Cyst of  left breast and right breast      Dr. Irena Reichmann   . Abdominal hysterectomy  1990    Dr. Colon Branch   . Dilation and curettage of uterus    . Lung cancer surgery  10/01/11  DR.BURNEY    (L)VATS,ANT. MINI THORACOTOMY, WEDGE RESECTION OF LULOBE LESION WITH NODWE SAMPLING    Family History  Problem Relation Age of Onset  . Throat cancer Mother   . Cancer Mother     Social History History  Substance Use Topics  . Smoking status: Former Smoker -- 3.00 packs/day for 32 years    Types: Cigarettes  . Smokeless tobacco: Former Neurosurgeon    Quit date: 03/08/1990     Comment: smoked 3ppd from 1959-1991   . Alcohol Use: No    Allergies  Allergen Reactions  . Contrast  Media [Iodinated Diagnostic Agents]   . Iohexol      Code: HIVES, Desc: hives w/ contrast '08// better w/ benadryl   . Sulfa Antibiotics   . Sulfamethoxazole-Trimethoprim     Current Outpatient Prescriptions  Medication Sig Dispense Refill  . acetaminophen (TYLENOL) 500 MG tablet Take 1,000 mg by mouth every 6 (six) hours as needed. For pain.      Marland Kitchen albuterol (PROAIR HFA) 108 (90 BASE) MCG/ACT inhaler USE 2 INHALATIONS EVERY 6 HOURS AS NEEDED  18 g  2  . Garlic 1000 MG CAPS Take 1 capsule by mouth 2 (two) times daily.       . hydrochlorothiazide (MICROZIDE) 12.5 MG capsule Take 2 capsules (25 mg total) by mouth daily.  180 capsule  0  . metoprolol (LOPRESSOR) 100 MG tablet Take 1 tablet (100 mg total) by mouth 2 (two) times daily.  180 tablet  0  . nystatin (MYCOSTATIN/NYSTOP) 100000 UNIT/GM POWD 3 times daily  60 g  3  . simvastatin (ZOCOR) 10 MG tablet Take 1 tablet (10 mg total) by mouth  at bedtime.  90 tablet  1  . warfarin (COUMADIN) 5 MG tablet Take 0.5-1 tablets (2.5-5 mg total) by mouth See admin instructions. 0.5 tab daily except 1 tab daily on Tuesdays and Saturdays.  90 tablet  1  . [DISCONTINUED] Calcium Citrate-Vitamin D (CALCIUM CITRATE + D) 300-100 MG-UNIT TABS Take 0.5 tablets by mouth 2 (two) times daily.        . [DISCONTINUED] metoprolol (TOPROL-XL) 100 MG 24 hr tablet Take 100 mg by mouth daily.         No current facility-administered medications for this visit.    Review of Systems Review of Systems  Constitutional: Negative.   HENT: Negative.   Eyes: Negative.   Respiratory: Negative.   Cardiovascular: Negative.   Gastrointestinal: Negative.   Endocrine: Negative.   Genitourinary: Negative.   Musculoskeletal: Negative.   Skin: Positive for rash.  Allergic/Immunologic: Negative.   Neurological: Negative.   Hematological: Negative.   Psychiatric/Behavioral: Negative.     There were no vitals taken for this visit.  Physical Exam Physical Exam   Constitutional: She is oriented to person, place, and time. She appears well-developed and well-nourished.  HENT:  Head: Normocephalic and atraumatic.  Eyes: Conjunctivae and EOM are normal. Pupils are equal, round, and reactive to light.  Neck: Normal range of motion. Neck supple.  Cardiovascular: Normal heart sounds.   She has an irregular rate and rhythm  Pulmonary/Chest: Effort normal and breath sounds normal.  There is no palpable mass in either breast. There is no palpable axillary, supraclavicular, or cervical lymphadenopathy  Abdominal: Soft. Bowel sounds are normal. She exhibits no mass. There is no tenderness.  Musculoskeletal: Normal range of motion.  Lymphadenopathy:    She has no cervical adenopathy.  Neurological: She is alert and oriented to person, place, and time.  Skin: Skin is warm and dry.  Psychiatric: She has a normal mood and affect. Her behavior is normal.    Data Reviewed As above  Assessment    The patient appears to have a stage I breast cancer in the lateral aspect of the right breast. I have Talk to her in detail about the different options for treatment. She favors breast conservation I think this is a reasonable choice. She is also a good candidate for sentinel node mapping. I discussed with her in detail the risks and benefits of the operation to do this as well as some of the technical aspects and she understands and wishes to proceed     Plan    Plan for right breast wire localized lumpectomy and sentinel node mapping. She will also need cardiac clearance prior to scheduling surgery so that she can be off her blood thinner        TOTH III,[employer] [place] 11/21/2013, 10:39 AM

## 2013-12-17 NOTE — Transfer of Care (Signed)
Immediate Anesthesia Transfer of Care Note  Patient: Tammie [name]  Procedure(s) Performed: Procedure(s): BREAST LUMPECTOMY WITH NEEDLE LOCALIZATION AND AXILLARY SENTINEL LYMPH NODE BX (Right)  Patient Location: PACU  Anesthesia Type:General  Level of Consciousness: awake, alert  and oriented  Airway & Oxygen Therapy: Patient Spontanous Breathing and Patient connected to nasal cannula oxygen  Post-op Assessment: Report given to PACU RN, Post -op Vital signs reviewed and stable and Patient moving all extremities X 4  Post vital signs: Reviewed and stable  Complications: No apparent anesthesia complications

## 2013-12-17 NOTE — Anesthesia Postprocedure Evaluation (Signed)
  Anesthesia Post-op Note  Patient: Tammie [name]  Procedure(s) Performed: Procedure(s): BREAST LUMPECTOMY WITH NEEDLE LOCALIZATION AND AXILLARY SENTINEL LYMPH NODE BX (Right)  Patient Location: PACU  Anesthesia Type:General  Level of Consciousness: awake, alert  and oriented  Airway and Oxygen Therapy: Patient Spontanous Breathing and Patient connected to nasal cannula oxygen  Post-op Pain: mild  Post-op Assessment: Post-op Vital signs reviewed, Patient's Cardiovascular Status Stable, Respiratory Function Stable, Patent Airway and Pain level controlled  Post-op Vital Signs: stable  Complications: No apparent anesthesia complications

## 2013-12-17 NOTE — Op Note (Signed)
12/17/2013  12:05 PM  PATIENT:  Tammie [name]  72 y.o. female  PRE-OPERATIVE DIAGNOSIS:  Right breast cancer  POST-OPERATIVE DIAGNOSIS:  Right breast cancer  PROCEDURE:  Procedure(s): BREAST LUMPECTOMY WITH NEEDLE LOCALIZATION AND AXILLARY SENTINEL LYMPH NODE BX (Right)  SURGEON:  Surgeon(s) and Role:    * Robyne Askew, MD - Primary  PHYSICIAN ASSISTANT:   ASSISTANTS: none   ANESTHESIA:   general  EBL:     BLOOD ADMINISTERED:none  DRAINS: none   LOCAL MEDICATIONS USED:  MARCAINE     SPECIMEN:  Source of Specimen:  right breast tissue and sentinal nodes X 2  DISPOSITION OF SPECIMEN:  PATHOLOGY  COUNTS:  YES  TOURNIQUET:  * No tourniquets in log *  DICTATION: .Dragon Dictation After informed consent was obtained the patient was brought to the operating room and placed in the supine position on the operating table. After adequate induction of general anesthesia the patient's right chest, breast, and axilla were prepped with ChloraPrep, allowed to dry, and draped in usual sterile manner. Earlier in the day the patient underwent injection of 1 mCi of technetium sulfur colloid in the subareolar position on the right. Also earlier in the day the patient underwent wire localization procedure and the wire was entering the right breast in the 9:00 position and headed medially. At this point, 2 cc of methylene blue 3 cc of injectable saline were also injected in the subareolar position on the right and the breast was massaged for several minutes. A neoprobe device was used to identify a hot spot in the right axilla. A small transversely oriented incision was made with a 15 blade knife overlying the hot spot. This incision was carried through the skin and subcutaneous tissue sharply with electrocautery until the axilla was entered. Blunt hemostat dissection was directed by the neoprobe until a hot blue lymph node was identified. This was excised by a combination of sharp Bovie  dissection and then the lymphatics were clamped with hemostats, divided, and ligated with 3-0 Vicryl ties. Ex vivo counts on this sentinel node #1 were approximately 1400. A second hot blue lymph node was also identified and excised in a similar manner. Ex vivo count on sentinel node #2 were approximately 400. There were no other hot, blue, probable lymph nodes in the right axilla. The wound was infiltrated with quarter percent Marcaine. The deep layer the wound was closed with interrupted 0 Vicryl stitches. The skin was then closed with a running 4-0 Monocryl subcuticular stitch. Attention was then turned to the right breast. An elliptical incision was made in the lateral aspect of the right breast overlying the path of the wire. This incision was carried through the skin and subcutaneous tissue sharply with the electrocautery. A circular portion of breast tissue was excised sharply with the electrocautery around the path of the wire. Once this was complete the specimen was removed from the patient. The specimen was painted with the assigned paint colors for orientation. A specimen radiograph was obtained that showed the calcifications and distortion to be in the center of the specimen. The specimen was then sent to pathology for further evaluation. Hemostasis was achieved using the Bovie electrocautery. The wound was infiltrated with quarter percent Marcaine and irrigated with copious amounts of saline. The edges of the cavity were marked with clips. The deep layer the wound was closed with interrupted 3-0 Vicryl stitches. The skin was then closed with interrupted 4-0 Monocryl subcuticular stitches. Dermabond dressings were applied.  The patient tolerated the procedure well. At the end of the case all needle sponge and instrument counts were correct. The patient was then awakened and taken to recovery in stable condition.  PLAN OF CARE: Discharge to home after PACU  PATIENT DISPOSITION:  PACU - hemodynamically  stable.   Delay start of Pharmacological VTE agent (>24hrs) due to surgical blood loss or risk of bleeding: not applicable

## 2013-12-17 NOTE — Anesthesia Preprocedure Evaluation (Addendum)
Anesthesia Evaluation  Patient identified by MRN, date of birth, ID band Patient awake    Reviewed: Allergy & Precautions, H&P , NPO status , Patient's Chart, lab work & pertinent test results  Airway Mallampati: II TM Distance: >3 FB Neck ROM: Full    Dental  (+) Edentulous Upper   Pulmonary asthma , pneumonia -, COPD COPD inhaler, former smoker,  breath sounds clear to auscultation        Cardiovascular hypertension, Pt. on home beta blockers + Peripheral Vascular Disease Rhythm:Regular Rate:Normal     Neuro/Psych CVA negative psych ROS   GI/Hepatic Neg liver ROS, GERD-  Medicated,  Endo/Other  Morbid obesity  Renal/GU negative Renal ROS     Musculoskeletal   Abdominal (+) + obese,   Peds  Hematology   Anesthesia Other Findings   Reproductive/Obstetrics                         Anesthesia Physical Anesthesia Plan  ASA: III  Anesthesia Plan: General   Post-op Pain Management:    Induction: Intravenous  Airway Management Planned: LMA  Additional Equipment:   Intra-op Plan:   Post-operative Plan: Extubation in OR  Informed Consent: I have reviewed the patients History and Physical, chart, labs and discussed the procedure including the risks, benefits and alternatives for the proposed anesthesia with the patient or authorized representative who has indicated his/her understanding and acceptance.   Dental advisory given  Plan Discussed with: CRNA, Anesthesiologist and Surgeon  Anesthesia Plan Comments:        Anesthesia Quick Evaluation

## 2013-12-17 NOTE — Preoperative (Signed)
Beta Blockers   Reason not to administer Beta Blockers:Not Applicable 

## 2013-12-17 NOTE — Anesthesia Procedure Notes (Signed)
Procedure Name: LMA Insertion Date/Time: 12/17/2013 10:34 AM Performed by: Orvilla Fus A Pre-anesthesia Checklist: Patient identified, Timeout performed, Emergency Drugs available, Suction available and Patient being monitored Patient Re-evaluated:Patient Re-evaluated prior to inductionOxygen Delivery Method: Circle system utilized Preoxygenation: Pre-oxygenation with 100% oxygen Intubation Type: IV induction Ventilation: Mask ventilation without difficulty LMA: LMA inserted LMA Size: 4.0 Number of attempts: 1 Placement Confirmation: positive ETCO2 and breath sounds checked- equal and bilateral Tube secured with: Tape Dental Injury: Teeth and Oropharynx as per pre-operative assessment

## 2013-12-17 NOTE — Interval H&P Note (Signed)
History and Physical Interval Note:  12/17/2013 9:54 AM  Tammie [name]  has presented today for surgery, with the diagnosis of right breast cancer  The various methods of treatment have been discussed with the patient and family. After consideration of risks, benefits and other options for treatment, the patient has consented to  Procedure(s): BREAST LUMPECTOMY WITH NEEDLE LOCALIZATION AND AXILLARY SENTINEL LYMPH NODE BX (Right) as a surgical intervention .  The patient's history has been reviewed, patient examined, no change in status, stable for surgery.  I have reviewed the patient's chart and labs.  Questions were answered to the patient's satisfaction.     TOTH III,PAUL S

## 2013-12-18 ENCOUNTER — Encounter (HOSPITAL_COMMUNITY): Payer: Self-pay | Admitting: General Surgery

## 2013-12-21 NOTE — Addendum Note (Signed)
Addendum created 12/21/13 1648 by Kipp Brood, MD   Modules edited: Anesthesia Responsible Staff

## 2014-01-03 ENCOUNTER — Ambulatory Visit (INDEPENDENT_AMBULATORY_CARE_PROVIDER_SITE_OTHER): Payer: Medicare HMO | Admitting: Pharmacist

## 2014-01-03 DIAGNOSIS — I4891 Unspecified atrial fibrillation: Secondary | ICD-10-CM

## 2014-01-03 LAB — POCT INR: INR: 1.5

## 2014-01-03 NOTE — Patient Instructions (Signed)
Anticoagulation Dose Instructions as of 01/03/2014     Tammie [name] Tue Wed Thu Fri Sat   New Dose 2.5 mg 0 mg 2.5 mg 2.5 mg 2.5 mg 2.5 mg 2.5 mg    Description       Take 1 tablet for 2 days, then restart regular dose of 1/2 tablet daily except none on Mondays.       INR was 1.5

## 2014-01-08 ENCOUNTER — Ambulatory Visit (INDEPENDENT_AMBULATORY_CARE_PROVIDER_SITE_OTHER): Payer: Medicare HMO | Admitting: General Surgery

## 2014-01-08 ENCOUNTER — Encounter (INDEPENDENT_AMBULATORY_CARE_PROVIDER_SITE_OTHER): Payer: Self-pay | Admitting: General Surgery

## 2014-01-08 VITALS — BP 136/80 | HR 80 | Temp 98.4°F | Resp 15 | Ht 71.0 in | Wt 282.8 lb

## 2014-01-08 DIAGNOSIS — C50419 Malignant neoplasm of upper-outer quadrant of unspecified female breast: Secondary | ICD-10-CM

## 2014-01-08 DIAGNOSIS — C50411 Malignant neoplasm of upper-outer quadrant of right female breast: Secondary | ICD-10-CM

## 2014-01-08 NOTE — Progress Notes (Signed)
Subjective:     Patient ID: Tammie [name], female   DOB: January 05, 1941, 73 y.o.   MRN: [id number]  HPI The patient is a 73 year old white female who is 3 weeks right lumpectomy and negative sentinel node biopsy for a T1 C. N0 right breast cancer. She was ER and PR negative as well as HER-2 negative. Her Ki-67 was 11%. Her only complaint is of some occasional ache in the back of the right arm.  Review of Systems     Objective:   Physical Exam On exam her right breast and axillary incisions are healing nicely with no sign of infection or significant seroma    Assessment:     The patient is 3 weeks status post right lumpectomy for breast cancer     Plan:     At this point it sounds as though she will receive radiation therapy as adjuvant treatment. She will probably not receive any chemotherapy at this point I would like her to continue to do regular self exams. We will plan to see her back in about 3 months

## 2014-01-09 ENCOUNTER — Encounter: Payer: Self-pay | Admitting: *Deleted

## 2014-01-09 NOTE — Progress Notes (Signed)
Location of Breast Cancer: Right Breast  Histology per Pathology Report:  12/17/13 Diagnosis 1. Lymph node, sentinel, biopsy, Right #1 - ONE LYMPH NODE, NEGATIVE FOR TUMOR (0/1). 2. Lymph node, sentinel, biopsy, Right #2 - ONE LYMPH NODE, NEGATIVE FOR TUMOR (0/1). 3. Breast, lumpectomy, Right - INVASIVE DUCTAL CARCINOMA. - INVASIVE TUMOR IS 3 MM FROM THE NEAREST MARGIN (SUPERIOR) - DUCTAL CARCINOMA IN SITU. - IN SITU CARCINOMA IS 3 MM FROM NEAREST MARGIN (SUPERIOR). - PREVIOUS BIOPSY SITE IDENTIFIED. - BENIGN SKIN; NEGATIVE FOR ATYPIA OR MALIGNANCY. - SEE TUMOR SYNOPTIC TEMPLATE BELOW. Microscopic Comment 3. BREAST, INVASIVE TUMOR, WITH LYMPH NODE SAMPLING 2 Sentinel 0 Non-sentinel 2 Total Lymph nodes with metastasis: 0     11/07/13     Diagnosis Breast, right, needle core biopsy, 9 o'clock - INVASIVE DUCTAL CARCINOMA,   Receptor Status: ER(0%), PR (0%), Her2-neu (No Amplification), Ki-67 (11%)   Found on screening mammography: screening mammography October 2014 noted a possible distortion in the patient's right breast, which was confirmed by additional views 11/02/2013. Right breast ultrasound was obtained the same day and showed innumerable cysts. There was an area of non-mass like enhancement at the 9:00 position 4 cm from the nipple, measuring 1.5 cm. Biopsy of this area 11/07/2013 showed ([id number]) and invasive ductal carcinoma, grade 1 or 2, triple negative, with an MIB-1 of 11%.   Past/Anticipated interventions by surgeon, if any: Lumpectomy Right Breast  Past/Anticipated interventions by medical oncology, if any: Chemotherapy  Lymphedema issues, if any:    Pain issues, if any:   SAFETY ISSUES:  Prior radiation? No  Pacemaker/ICD? No  Possible current pregnancy? No  Is the patient on methotrexate? No  Current Complaints / other details: Status post left upper lobe wedge resection 10/01/2011 of an invasive moderately differentiated adenocarcinoma measuring  1.3 cm, with no pleural involvement and 4 lymph nodes negative (pT1a pN0)--under close observation. States she still has "2 spots in her right presently.  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 She has significant pain in both knees. All this keeps her from ambulating much

## 2014-01-10 ENCOUNTER — Ambulatory Visit
Admission: RE | Admit: 2014-01-10 | Discharge: 2014-01-10 | Disposition: A | Discharge status: Home / Self Care | Payer: Medicare HMO | Source: Ambulatory Visit | Attending: Radiation Oncology | Admitting: Radiation Oncology

## 2014-01-10 ENCOUNTER — Encounter: Payer: Self-pay | Admitting: Radiation Oncology

## 2014-01-10 VITALS — BP 149/75 | HR 80 | Temp 98.9°F | Ht 71.0 in | Wt 284.4 lb

## 2014-01-10 DIAGNOSIS — Z510 Encounter for antineoplastic radiation therapy: Secondary | ICD-10-CM | POA: Insufficient documentation

## 2014-01-10 DIAGNOSIS — R110 Nausea: Secondary | ICD-10-CM | POA: Insufficient documentation

## 2014-01-10 DIAGNOSIS — C50919 Malignant neoplasm of unspecified site of unspecified female breast: Secondary | ICD-10-CM | POA: Insufficient documentation

## 2014-01-10 DIAGNOSIS — C50411 Malignant neoplasm of upper-outer quadrant of right female breast: Secondary | ICD-10-CM

## 2014-01-10 DIAGNOSIS — B372 Candidiasis of skin and nail: Secondary | ICD-10-CM | POA: Insufficient documentation

## 2014-01-10 DIAGNOSIS — M549 Dorsalgia, unspecified: Secondary | ICD-10-CM | POA: Insufficient documentation

## 2014-01-10 DIAGNOSIS — Z171 Estrogen receptor negative status [ER-]: Secondary | ICD-10-CM | POA: Insufficient documentation

## 2014-01-10 DIAGNOSIS — R197 Diarrhea, unspecified: Secondary | ICD-10-CM | POA: Insufficient documentation

## 2014-01-10 DIAGNOSIS — B9789 Other viral agents as the cause of diseases classified elsewhere: Secondary | ICD-10-CM | POA: Insufficient documentation

## 2014-01-10 DIAGNOSIS — L539 Erythematous condition, unspecified: Secondary | ICD-10-CM | POA: Insufficient documentation

## 2014-01-10 DIAGNOSIS — Z79899 Other long term (current) drug therapy: Secondary | ICD-10-CM | POA: Insufficient documentation

## 2014-01-10 NOTE — Progress Notes (Addendum)
Menarche age 73, hysterectomy a round and 1990. She did not have stopping the oophorectomy. She took post estrogen until approximately 1996, when it was discontinued because of "mini strokes". 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  Tammie [name] report that she continues to have fungal infection underneath her right breast and now has areas on her right arm and on her right cheek.  She has an appointment with a dermatologist next week.

## 2014-01-10 NOTE — Progress Notes (Addendum)
Department of Radiation Oncology  Phone:  [phone] Fax:        [phone]   Name: ELIANNAH HINDE MRN: [id number]  DOB: 1941/03/15  Date: 01/10/2014  Follow Up Visit Note  Diagnosis: T1cN0 Right breast cancer  Interval History: Reni presents today for routine followup.  She had her surgery and is healing up well from that. 2 sentinel lymph nodes were negative. Her tumor was triple negative. Her margins were all negative at 3 mm. She has been released by medical oncology and surgery. She is ready to start radiation. She is driving from [place] and would like as few treatments as possible. She is able to stay later today for simulation.  Allergies:  Allergies  Allergen Reactions  . Tape Hives  . Contrast Media [Iodinated Diagnostic Agents] Hives  . Iohexol Other (See Comments)     Code: HIVES, Desc: hives w/ contrast '08// better w/ benadryl   . Sulfa Antibiotics Other (See Comments)    REACTION: unknown  . Sulfamethoxazole-Trimethoprim Other (See Comments)    REACTION: unknown    Medications:  Current Outpatient Prescriptions  Medication Sig Dispense Refill  . acetaminophen (TYLENOL) 650 MG CR tablet Take 650 mg by mouth every 8 (eight) hours as needed for pain.      Marland Kitchen albuterol (PROVENTIL HFA;VENTOLIN HFA) 108 (90 BASE) MCG/ACT inhaler Inhale 2 puffs into the lungs every 6 (six) hours as needed for wheezing or shortness of breath.      . Calcium Carbonate Antacid (ANTACID PO) Take 1 tablet by mouth daily as needed (for reflux).      Marland Kitchen diphenhydrAMINE (BENADRYL) 25 MG tablet Take 25 mg by mouth at bedtime as needed for sleep.      . diphenhydramine-acetaminophen (TYLENOL PM) 25-500 MG TABS Take 1 tablet by mouth at bedtime as needed (for sleep).       . fluconazole (DIFLUCAN) 150 MG tablet Take 150 mg by mouth every 3 (three) days. Takes for 3 doses.  First dose 12/10/2013.      . Garlic 3662 MG CAPS Take 1,000 mg by mouth 2 (two) times daily.       . hydrochlorothiazide  (MICROZIDE) 12.5 MG capsule Take 12.5 mg by mouth 2 (two) times daily.      . metoprolol (LOPRESSOR) 100 MG tablet Take 1 tablet (100 mg total) by mouth 2 (two) times daily.  180 tablet  0  . oxyCODONE-acetaminophen (ROXICET) 5-325 MG per tablet Take 1-2 tablets by mouth every 4 (four) hours as needed for severe pain.  50 tablet  0  . simethicone (MYLICON) 947 MG chewable tablet Chew 125 mg by mouth every 6 (six) hours as needed for flatulence.      . simvastatin (ZOCOR) 10 MG tablet Take 5 mg by mouth daily.      Marland Kitchen warfarin (COUMADIN) 2.5 MG tablet Take 2.5 mg by mouth daily.      . [DISCONTINUED] Calcium Citrate-Vitamin D (CALCIUM CITRATE + D) 300-100 MG-UNIT TABS Take 0.5 tablets by mouth 2 (two) times daily.        . [DISCONTINUED] metoprolol (TOPROL-XL) 100 MG 24 hr tablet Take 100 mg by mouth daily.         No current facility-administered medications for this encounter.    Physical Exam:  Filed Vitals:   01/10/14 1104  BP: 149/75  Pulse: 80  Temp: 98.9 F (37.2 C)  Height: 5\' 11"  (1.803 m)  Weight: 284 lb 6.4 oz (129.003 kg)   pleasant  female in no distress sitting comfortable on examining room table. She has a well-healed surgical incision in the lateral aspect of the right breast.  IMPRESSION: Redith is a 73 y.o. female status post lumpectomy with the triple negative right breast cancer  PLAN:  Harl Bowie healed up well from surgery. I think you be fine to proceed on with radiation. We discussed the process of simulation the placement tattoos. We discussed skin redness as a possible side effect of treatment. We discussed the low rate of symptomatic lung and rib damage. We discussed the low rate of secondary malignancies. She has signed informed consent and agree to proceed forward. We'll treat her to at least 50 gray. I think given her large breast size she will not be a candidate for hypofractionation but will certainly measure and see.    [name], MD

## 2014-01-11 NOTE — Addendum Note (Signed)
Encounter addended by: Deirdre Evener, RN on: 01/11/2014  4:57 PM<BR>     Documentation filed: Charges VN

## 2014-01-16 NOTE — Progress Notes (Addendum)
Name: NIKYAH LACKMAN   MRN: [id number]  Date:  01/10/2014  DOB: [birth year]  Status:outpatient   DIAGNOSIS: Right Breast cancer.  CONSENT VERIFIED: yes SET UP: Patient is setup supine  IMMOBILIZATION:  The following immobilization was used:Custom Moldable Pillow, breast board.  NARRATIVE: Ms. Hoey was brought to the [facility].  Identity was confirmed.  All relevant records and images related to the planned course of therapy were reviewed.  Then, the patient was positioned in a stable reproducible clinical set-up for radiation therapy.  Wires were placed to delineate the clinical extent of breast tissue. A wire was placed on the scar as well.  CT images were obtained.  An isocenter was placed. Skin markings were placed.  The position of the heart was then analyzed. The CT images were loaded into the planning software where the target and avoidance structures were contoured.  The radiation prescription was entered and confirmed. The patient was discharged in stable condition and tolerated simulation well.    TREATMENT PLANNING NOTE/3D Simulation Note Treatment planning then occurred. I have requested : MLC's, isodose plan, basic dose calculation (4)  3D simulation was performed.  I personally designed and supervised the construction of 5 medically necessary complex treatment devices in the form of MLCs which will be used for beam modification and to protect critical structures including the heart and lung as well as the immobilization device which is necessary for reproducible set up.  I have requested a dose volume histogram of the heart, lung and tumor cavity. Reduced fields will be used to limit the high dose hotspots within the breast.  Radiation Oncology         [phone] ________________________________

## 2014-01-16 NOTE — Addendum Note (Signed)
Encounter addended by: [name], MD on: 01/16/2014  4:47 PM<BR>     Documentation filed: Notes Section

## 2014-01-16 NOTE — Progress Notes (Signed)
Radiation Oncology         [phone] ________________________________  Name: Tammie [name]      MRN: [id number]          Date: 01/10/2014              DOB: 14-Jan-1941  Optical Surface Tracking Plan:  Since intensity modulated radiotherapy (IMRT) and 3D conformal radiation treatment methods are predicated on accurate and precise positioning for treatment, intrafraction motion monitoring is medically necessary to ensure accurate and safe treatment delivery.  The ability to quantify intrafraction motion without excessive ionizing radiation dose can only be performed with optical surface tracking. Accordingly, surface imaging offers the opportunity to obtain 3D measurements of patient position throughout IMRT and 3D treatments without excessive radiation exposure.  I am ordering optical surface tracking for this patient's upcoming course of radiotherapy. ________________________________ Signature   Reference:   Ursula Alert, J, et al. Surface imaging-based analysis of intrafraction motion for breast radiotherapy patients.Journal of [place], n. 6, nov. 2014. ISSN 75170017.   Available at: <http://www.jacmp.org/index.php/jacmp/article/view/4957>.

## 2014-01-17 ENCOUNTER — Ambulatory Visit (INDEPENDENT_AMBULATORY_CARE_PROVIDER_SITE_OTHER): Payer: Medicare HMO | Admitting: Pharmacist

## 2014-01-17 DIAGNOSIS — I4891 Unspecified atrial fibrillation: Secondary | ICD-10-CM

## 2014-01-17 LAB — POCT INR: INR: 1.7

## 2014-01-17 NOTE — Patient Instructions (Signed)
Anticoagulation Dose Instructions as of 01/17/2014     Tammie [name] Tue Wed Thu Fri Sat   New Dose 2.5 mg 2.5 mg 2.5 mg 2.5 mg 2.5 mg 2.5 mg 2.5 mg    Description       Increase to 1/2 tablet daily      INR was 1.7 today

## 2014-01-18 ENCOUNTER — Ambulatory Visit
Admission: RE | Admit: 2014-01-18 | Discharge: 2014-01-18 | Disposition: A | Discharge status: Home / Self Care | Payer: Medicare HMO | Source: Ambulatory Visit | Attending: Radiation Oncology | Admitting: Radiation Oncology

## 2014-01-18 ENCOUNTER — Ambulatory Visit: Payer: Medicare HMO | Attending: Radiation Oncology

## 2014-01-18 DIAGNOSIS — C50411 Malignant neoplasm of upper-outer quadrant of right female breast: Secondary | ICD-10-CM

## 2014-01-19 ENCOUNTER — Other Ambulatory Visit: Payer: Self-pay | Admitting: Radiation Oncology

## 2014-01-19 DIAGNOSIS — I4891 Unspecified atrial fibrillation: Secondary | ICD-10-CM

## 2014-01-19 NOTE — Progress Notes (Signed)
  Radiation Oncology         [phone] ________________________________  Name: [name] MRN: [id number]  Date: 01/18/2014  DOB: [birth year]  Simulation Verification Note  Status: outpatient  NARRATIVE: The patient was brought to the treatment unit and placed in the planned treatment position. The clinical setup was verified. Then port films were obtained and uploaded to the radiation oncology medical record software.  The treatment beams were carefully compared against the planned radiation fields. The position location and shape of the radiation fields was reviewed. The targeted volume of tissue appears appropriately covered by the radiation beams. Organs at risk appear to be excluded as planned.  Based on my personal review, I approved the simulation verification. The patient's treatment will proceed as planned.  ------------------------------------------------  [name], MD

## 2014-01-21 ENCOUNTER — Ambulatory Visit
Admission: RE | Admit: 2014-01-21 | Discharge: 2014-01-21 | Disposition: A | Discharge status: Home / Self Care | Payer: Medicare HMO | Source: Ambulatory Visit | Attending: Radiation Oncology | Admitting: Radiation Oncology

## 2014-01-22 ENCOUNTER — Ambulatory Visit
Admission: RE | Admit: 2014-01-22 | Discharge: 2014-01-22 | Disposition: A | Discharge status: Home / Self Care | Payer: Medicare HMO | Source: Ambulatory Visit | Attending: Radiation Oncology | Admitting: Radiation Oncology

## 2014-01-22 ENCOUNTER — Ambulatory Visit: Payer: Medicare HMO | Admitting: Radiation Oncology

## 2014-01-23 ENCOUNTER — Encounter: Payer: Self-pay | Admitting: Radiation Oncology

## 2014-01-23 ENCOUNTER — Ambulatory Visit
Admission: RE | Admit: 2014-01-23 | Discharge: 2014-01-23 | Disposition: A | Discharge status: Home / Self Care | Payer: Medicare HMO | Source: Ambulatory Visit | Attending: Radiation Oncology | Admitting: Radiation Oncology

## 2014-01-23 VITALS — BP 163/89 | HR 95 | Resp 16 | Wt 284.8 lb

## 2014-01-23 DIAGNOSIS — C50411 Malignant neoplasm of upper-outer quadrant of right female breast: Secondary | ICD-10-CM

## 2014-01-23 NOTE — Progress Notes (Signed)
Reports pain at right axilla has resolved. No edema of right arm or breast noted. Denies nipple discharge. Denies breast pain. Reports a yeast and staph infection under both breast.

## 2014-01-24 ENCOUNTER — Ambulatory Visit
Admission: RE | Admit: 2014-01-24 | Discharge: 2014-01-24 | Disposition: A | Discharge status: Home / Self Care | Payer: Medicare HMO | Source: Ambulatory Visit | Attending: Radiation Oncology | Admitting: Radiation Oncology

## 2014-01-24 DIAGNOSIS — C50411 Malignant neoplasm of upper-outer quadrant of right female breast: Secondary | ICD-10-CM

## 2014-01-24 MED ORDER — RADIAPLEXRX EX GEL
Freq: Once | CUTANEOUS | Status: AC
  Administered 2014-01-24: 17:00:00 via TOPICAL

## 2014-01-24 MED ORDER — ALRA NON-METALLIC DEODORANT (RAD-ONC)
1.0000 "application " | Freq: Once | TOPICAL | Status: AC
  Administered 2014-01-24: 1 via TOPICAL

## 2014-01-24 NOTE — Progress Notes (Signed)
Weekly Management Note (seen 1/28) Current Dose:  4 Gy  Projected Dose: 50 Gy   Narrative:  The patient presents for routine under treatment assessment.  CBCT/MVCT images/Port film x-rays were reviewed.  The chart was checked. Yeast and infection clearing under breasts due to dermatology input. No pain.   Physical Findings: Weight: 284 lb 12.8 oz (129.184 kg). Inframammary fold is clear and dry.   Impression:  The patient is tolerating radiation.  Plan:  Continue treatment as planned.

## 2014-01-24 NOTE — Progress Notes (Signed)
Pt education done, alra, radiaplex gel, book,rad given to patient discusses pain, diet, hydration, skin irritation, fatigue, see MD weekly/prn, teach back given,

## 2014-01-25 ENCOUNTER — Ambulatory Visit
Admission: RE | Admit: 2014-01-25 | Discharge: 2014-01-25 | Disposition: A | Discharge status: Home / Self Care | Payer: Medicare HMO | Source: Ambulatory Visit | Attending: Radiation Oncology | Admitting: Radiation Oncology

## 2014-01-28 ENCOUNTER — Ambulatory Visit
Admission: RE | Admit: 2014-01-28 | Discharge: 2014-01-28 | Disposition: A | Discharge status: Home / Self Care | Payer: Medicare HMO | Source: Ambulatory Visit | Attending: Radiation Oncology | Admitting: Radiation Oncology

## 2014-01-29 ENCOUNTER — Encounter: Payer: Self-pay | Admitting: Radiation Oncology

## 2014-01-29 ENCOUNTER — Ambulatory Visit
Admission: RE | Admit: 2014-01-29 | Discharge: 2014-01-29 | Disposition: A | Discharge status: Home / Self Care | Payer: Medicare HMO | Source: Ambulatory Visit | Attending: Radiation Oncology | Admitting: Radiation Oncology

## 2014-01-29 VITALS — BP 154/84 | HR 78 | Temp 98.5°F | Resp 20 | Wt 285.5 lb

## 2014-01-29 DIAGNOSIS — C50411 Malignant neoplasm of upper-outer quadrant of right female breast: Secondary | ICD-10-CM

## 2014-01-29 NOTE — Progress Notes (Signed)
Weekly Management Note Current Dose: 14  Gy  Projected Dose: 50 Gy   Narrative:  The patient presents for routine under treatment assessment.  CBCT/MVCT images/Port film x-rays were reviewed.  The chart was checked. Tired after spending the night in the hospital with a friend. Using dermatologist lotion in the morning.   Physical Findings: Weight: 285 lb 8 oz (129.502 kg). Skin has no changes. Inframammary fold is clean  Impression:  The patient is tolerating radiation.  Plan:  Continue treatment as planned.Cautioned her to place lotions on >4 hours prior to treatment. Use radiaplex. Bid.

## 2014-01-29 NOTE — Progress Notes (Signed)
Weekly rad txs, rt breast 7 completed, slight erythema, skin intact, patient fluticasone propionate 0.5% cream 1st, then ketokonazole cream 2nd and then A&D oitmnent 3rd for yeast  Under her inframmary fold and stomach folds, is radiaplex gel after bath last night, inframmary fold dry, no pain today, every once in a while a little spurt pain in breast" stated patient 2:28 PM

## 2014-01-30 ENCOUNTER — Ambulatory Visit
Admission: RE | Admit: 2014-01-30 | Discharge: 2014-01-30 | Disposition: A | Discharge status: Home / Self Care | Payer: Medicare HMO | Source: Ambulatory Visit | Attending: Radiation Oncology | Admitting: Radiation Oncology

## 2014-01-31 ENCOUNTER — Ambulatory Visit
Admission: RE | Admit: 2014-01-31 | Discharge: 2014-01-31 | Disposition: A | Discharge status: Home / Self Care | Payer: Medicare HMO | Source: Ambulatory Visit | Attending: Radiation Oncology | Admitting: Radiation Oncology

## 2014-02-01 ENCOUNTER — Ambulatory Visit
Admission: RE | Admit: 2014-02-01 | Discharge: 2014-02-01 | Disposition: A | Discharge status: Home / Self Care | Payer: Medicare HMO | Source: Ambulatory Visit | Attending: Radiation Oncology | Admitting: Radiation Oncology

## 2014-02-04 ENCOUNTER — Ambulatory Visit
Admission: RE | Admit: 2014-02-04 | Discharge: 2014-02-04 | Disposition: A | Discharge status: Home / Self Care | Payer: Medicare HMO | Source: Ambulatory Visit | Attending: Radiation Oncology | Admitting: Radiation Oncology

## 2014-02-05 ENCOUNTER — Ambulatory Visit
Admission: RE | Admit: 2014-02-05 | Discharge: 2014-02-05 | Disposition: A | Discharge status: Home / Self Care | Payer: Medicare HMO | Source: Ambulatory Visit | Attending: Radiation Oncology | Admitting: Radiation Oncology

## 2014-02-05 ENCOUNTER — Encounter: Payer: Self-pay | Admitting: Radiation Oncology

## 2014-02-05 VITALS — BP 140/78 | HR 80 | Temp 97.7°F | Resp 20 | Wt 283.6 lb

## 2014-02-05 DIAGNOSIS — C50411 Malignant neoplasm of upper-outer quadrant of right female breast: Secondary | ICD-10-CM

## 2014-02-05 NOTE — Progress Notes (Signed)
Weekly rad txs, rt breast 12 completed, erythema only skin intact, only using radiplex gel bid now, other creams d/c from dermatologist, c/o left low back pain c/o, takes tylenol po prn, some fatigue still Eating well,  2:24 PM

## 2014-02-05 NOTE — Progress Notes (Signed)
Weekly Management Note Current Dose: 24  Gy  Projected Dose: 50 Gy   Narrative:  The patient presents for routine under treatment assessment.  CBCT/MVCT images/Port film x-rays were reviewed.  The chart was checked. Doing well. Some back pain which is not new for her and which is relieved by tylenol.  She  Believes it is her "kidneys". Using radiaplex. Would like to use a skin tag cream  Physical Findings: Weight: 283 lb 9.6 oz (128.64 kg). Slightly pink right breast  Impression:  The patient is tolerating radiation.  Plan:  Continue treatment as planned. Continue radiaplex. OK to use cream not on right breast/treatment area.

## 2014-02-06 ENCOUNTER — Ambulatory Visit
Admission: RE | Admit: 2014-02-06 | Discharge: 2014-02-06 | Disposition: A | Discharge status: Home / Self Care | Payer: Medicare HMO | Source: Ambulatory Visit | Attending: Radiation Oncology | Admitting: Radiation Oncology

## 2014-02-07 ENCOUNTER — Ambulatory Visit
Admission: RE | Admit: 2014-02-07 | Discharge: 2014-02-07 | Disposition: A | Discharge status: Home / Self Care | Payer: Medicare HMO | Source: Ambulatory Visit | Attending: Radiation Oncology | Admitting: Radiation Oncology

## 2014-02-08 ENCOUNTER — Ambulatory Visit
Admission: RE | Admit: 2014-02-08 | Discharge: 2014-02-08 | Disposition: A | Discharge status: Home / Self Care | Payer: Medicare HMO | Source: Ambulatory Visit | Attending: Radiation Oncology | Admitting: Radiation Oncology

## 2014-02-11 ENCOUNTER — Ambulatory Visit
Admission: RE | Admit: 2014-02-11 | Discharge: 2014-02-11 | Disposition: A | Discharge status: Home / Self Care | Payer: Medicare HMO | Source: Ambulatory Visit | Attending: Radiation Oncology | Admitting: Radiation Oncology

## 2014-02-12 ENCOUNTER — Ambulatory Visit
Admission: RE | Admit: 2014-02-12 | Discharge: 2014-02-12 | Disposition: A | Discharge status: Home / Self Care | Payer: Medicare HMO | Source: Ambulatory Visit | Attending: Radiation Oncology | Admitting: Radiation Oncology

## 2014-02-12 ENCOUNTER — Encounter: Payer: Self-pay | Admitting: Radiation Oncology

## 2014-02-12 VITALS — BP 146/79 | HR 76 | Temp 98.6°F | Resp 16 | Wt 280.4 lb

## 2014-02-12 DIAGNOSIS — C50411 Malignant neoplasm of upper-outer quadrant of right female breast: Secondary | ICD-10-CM

## 2014-02-12 NOTE — Progress Notes (Signed)
Weekly Management Note Current Dose: 32  Gy  Projected Dose: 50 Gy   Narrative:  The patient presents for routine under treatment assessment.  CBCT/MVCT images/Port film x-rays were reviewed.  The chart was checked. Diarrhea and nausea due to sick contact. Picking up immodium today. No fever or chills. Breast feels irriated inside. Not taking any pain medications. Using radiaplex.  Physical Findings: Weight: 280 lb 6.4 oz (127.189 kg). Pink right breast. Inframammary fold intact.  Impression:  The patient is tolerating radiation.  Plan:  Continue treatment as planned. Continue radiaplex. OK to take OTC pain reliever as she normally does if breast pain continues.

## 2014-02-12 NOTE — Progress Notes (Signed)
Weekly rad txs right breast 16 txs completed, erythema, skin thinning under inframmary fold, skin is intact, using radiaplex bid, has had diarrhea 2-3 days, will p/u imodium  Today,caught a virus from female friend, may not be able to come tomorrow her driveway up a hill,friend brought  Her today, appetite good, energy level fair 1:42 PM

## 2014-02-13 ENCOUNTER — Ambulatory Visit
Admission: RE | Admit: 2014-02-13 | Discharge: 2014-02-13 | Disposition: A | Discharge status: Home / Self Care | Payer: Medicare HMO | Source: Ambulatory Visit | Attending: Radiation Oncology | Admitting: Radiation Oncology

## 2014-02-14 ENCOUNTER — Ambulatory Visit: Payer: Medicare HMO

## 2014-02-15 ENCOUNTER — Ambulatory Visit
Admission: RE | Admit: 2014-02-15 | Discharge: 2014-02-15 | Disposition: A | Discharge status: Home / Self Care | Payer: Medicare HMO | Source: Ambulatory Visit | Attending: Radiation Oncology | Admitting: Radiation Oncology

## 2014-02-18 ENCOUNTER — Ambulatory Visit
Admission: RE | Admit: 2014-02-18 | Discharge: 2014-02-18 | Disposition: A | Discharge status: Home / Self Care | Payer: Medicare HMO | Source: Ambulatory Visit | Attending: Radiation Oncology | Admitting: Radiation Oncology

## 2014-02-18 ENCOUNTER — Telehealth: Payer: Self-pay | Admitting: Nurse Practitioner

## 2014-02-18 MED ORDER — ALBUTEROL SULFATE HFA 108 (90 BASE) MCG/ACT IN AERS: 2.0000 | INHALATION_SPRAY | Freq: Four times a day (QID) | RESPIRATORY_TRACT | Status: DC | PRN

## 2014-02-18 MED ORDER — METOPROLOL TARTRATE 100 MG PO TABS: 100.0000 mg | ORAL_TABLET | Freq: Two times a day (BID) | ORAL | Status: DC

## 2014-02-18 MED ORDER — WARFARIN SODIUM 2.5 MG PO TABS: 2.5000 mg | ORAL_TABLET | Freq: Every day | ORAL | Status: DC

## 2014-02-18 MED ORDER — HYDROCHLOROTHIAZIDE 12.5 MG PO CAPS: 12.5000 mg | ORAL_CAPSULE | Freq: Two times a day (BID) | ORAL | Status: DC

## 2014-02-18 MED ORDER — SIMVASTATIN 10 MG PO TABS: 5.0000 mg | ORAL_TABLET | Freq: Every day | ORAL | Status: DC

## 2014-02-18 NOTE — Telephone Encounter (Signed)
rx ready for pickup 

## 2014-02-18 NOTE — Telephone Encounter (Signed)
Patient aware rx up front

## 2014-02-19 ENCOUNTER — Ambulatory Visit
Admission: RE | Admit: 2014-02-19 | Discharge: 2014-02-19 | Disposition: A | Discharge status: Home / Self Care | Payer: Medicare HMO | Source: Ambulatory Visit | Attending: Radiation Oncology | Admitting: Radiation Oncology

## 2014-02-19 VITALS — BP 133/73 | HR 68 | Temp 98.8°F | Ht 71.0 in | Wt 286.6 lb

## 2014-02-19 DIAGNOSIS — C50411 Malignant neoplasm of upper-outer quadrant of right female breast: Secondary | ICD-10-CM

## 2014-02-19 NOTE — Progress Notes (Signed)
Weekly Management Note Current Dose: 42  Gy  Projected Dose: 50 Gy   Narrative:  The patient presents for routine under treatment assessment.  CBCT/MVCT images/Port film x-rays were reviewed.  The chart was checked. Doing well. Sore skin under arm and breast.   Physical Findings: Weight: 286 lb 9.6 oz (130.001 kg). Pink breast.   Impression:  The patient is tolerating radiation.  Plan:  Continue treatment as planned. Continue radiaplex. Discussed skin care after treatment.

## 2014-02-19 NOTE — Progress Notes (Addendum)
Tammie [name] has had 21 fractions to her right breast.  She reports that the skin on her right breast is painful when anything touches it.  She also has a very sore area above the incision under her right arm. She has a pimple on the side of her right breast that she is putting A&D ointment on.  She is using radiaplex.  She reports fatigue.

## 2014-02-20 ENCOUNTER — Ambulatory Visit
Admission: RE | Admit: 2014-02-20 | Discharge: 2014-02-20 | Disposition: A | Discharge status: Home / Self Care | Payer: Medicare HMO | Source: Ambulatory Visit | Attending: Radiation Oncology | Admitting: Radiation Oncology

## 2014-02-21 ENCOUNTER — Ambulatory Visit
Admission: RE | Admit: 2014-02-21 | Discharge: 2014-02-21 | Disposition: A | Discharge status: Home / Self Care | Payer: Medicare HMO | Source: Ambulatory Visit | Attending: Radiation Oncology | Admitting: Radiation Oncology

## 2014-02-22 ENCOUNTER — Ambulatory Visit
Admission: RE | Admit: 2014-02-22 | Discharge: 2014-02-22 | Disposition: A | Discharge status: Home / Self Care | Payer: Medicare HMO | Source: Ambulatory Visit | Attending: Radiation Oncology | Admitting: Radiation Oncology

## 2014-02-22 ENCOUNTER — Ambulatory Visit: Payer: Medicare HMO

## 2014-02-25 ENCOUNTER — Encounter: Payer: Self-pay | Admitting: Radiation Oncology

## 2014-02-25 ENCOUNTER — Ambulatory Visit
Admission: RE | Admit: 2014-02-25 | Discharge: 2014-02-25 | Disposition: A | Discharge status: Home / Self Care | Payer: Medicare HMO | Source: Ambulatory Visit | Attending: Radiation Oncology | Admitting: Radiation Oncology

## 2014-03-04 NOTE — Progress Notes (Addendum)
  Radiation Oncology         [phone] ________________________________  Name: Tammie [name] MRN: [id number]  Date: 02/25/2014  DOB: 1941-02-19  End of Treatment Note  Diagnosis:   Right T1cN0 Breast Cancer     Indication for treatment:  Curative       Radiation treatment dates:   01/21/2014-02/25/2014  Site/dose:   Right breast/50 Gy in 25 fractions at 2 Gy per fraction  Beams/energy:   Opposed tangents with 10 and 15 MV photons  Narrative: The patient tolerated radiation treatment relatively well.   She finished with moderate skin irritaiton but no moist desquamation.   Plan: The patient has completed radiation treatment. The patient will return to radiation oncology clinic for routine followup in one month. I advised them to call or return sooner if they have any questions or concerns related to their recovery or treatment. She has an appointment with [name] in April. She is triple negative so she is not a candidate for antiestrogen therapy.   ------------------------------------------------  [name], MD

## 2014-03-05 ENCOUNTER — Ambulatory Visit (INDEPENDENT_AMBULATORY_CARE_PROVIDER_SITE_OTHER): Payer: Medicare HMO | Admitting: Pharmacist Clinician (PhC)/ Clinical Pharmacy Specialist

## 2014-03-05 DIAGNOSIS — I4891 Unspecified atrial fibrillation: Secondary | ICD-10-CM

## 2014-03-05 LAB — POCT INR: INR: 2.4

## 2014-03-11 NOTE — Addendum Note (Signed)
Encounter addended by: [name], MD on: 03/11/2014  4:26 PM<BR>     Documentation filed: Visit Diagnoses, Notes Section

## 2014-03-12 IMAGING — US ULTRASOUND CORE BIOPSY
1 series · 7 of 7 positions shown · non-contrast
Comparison: Previous exams.

ADDENDUM:
The final pathological diagnosis is invasive ductal carcinoma. I
discussed this with the patient by telephone on 11/12/2013. Her
questions were answered. She reported some bruising at the biopsy
site with no pain or palpable hematoma.

Jonathan Higareda, the nurse navigator at [[facility]], discussed the final pathological diagnosis with
Ourari Tiger. The patient has been receiving surgical and
oncological care for lung carcinoma in [[facility]OSPITAL]. Therefore, she
was given an appointment at the Multidisciplinary Breast Clinic at
the [HOSPITAL] [facility]] on 11/21/2013.
CLINICAL DATA: Right breast architectural distortion.
EXAM:
US BREAST BX BINO TIGER 1ST LESION IMG BX SPEC US GUIDE*R* ;
ULTRASOUND CORE BIOPSY

[Series 1: ultrasound core biopsy · 0.09mm/px · 7 of 7 slices shown]
[im 1/7]
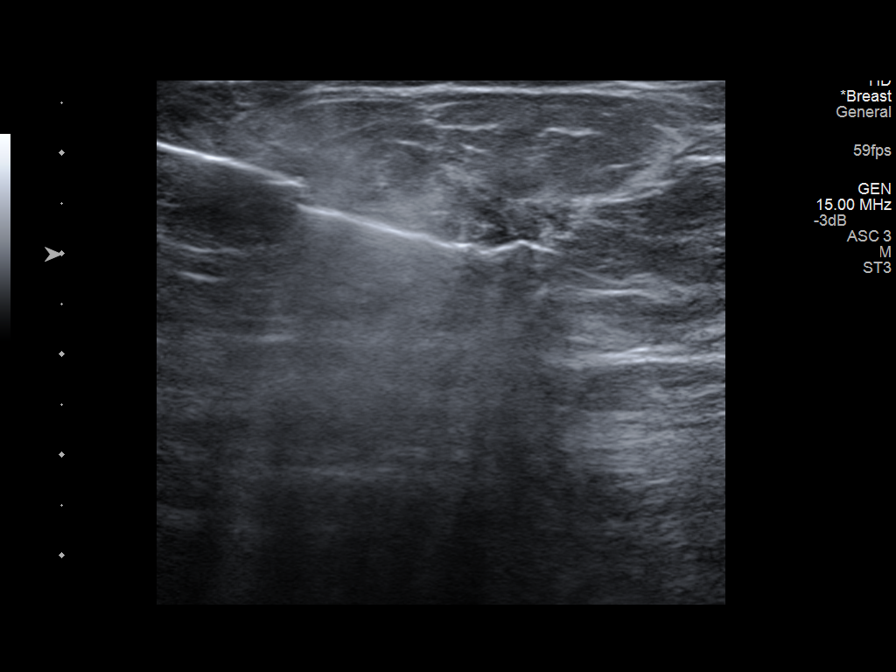
[im 2/7]
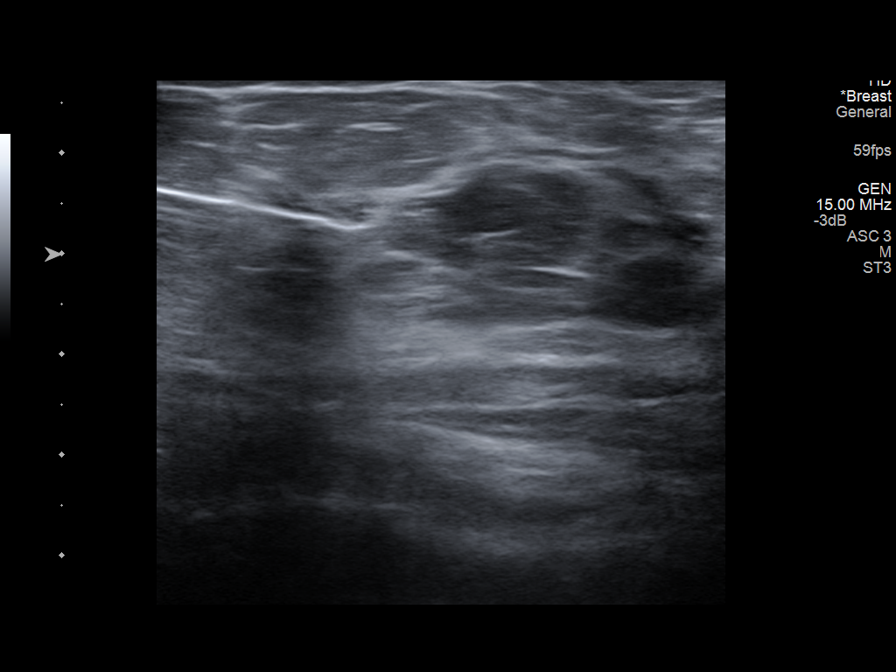
[im 3/7]
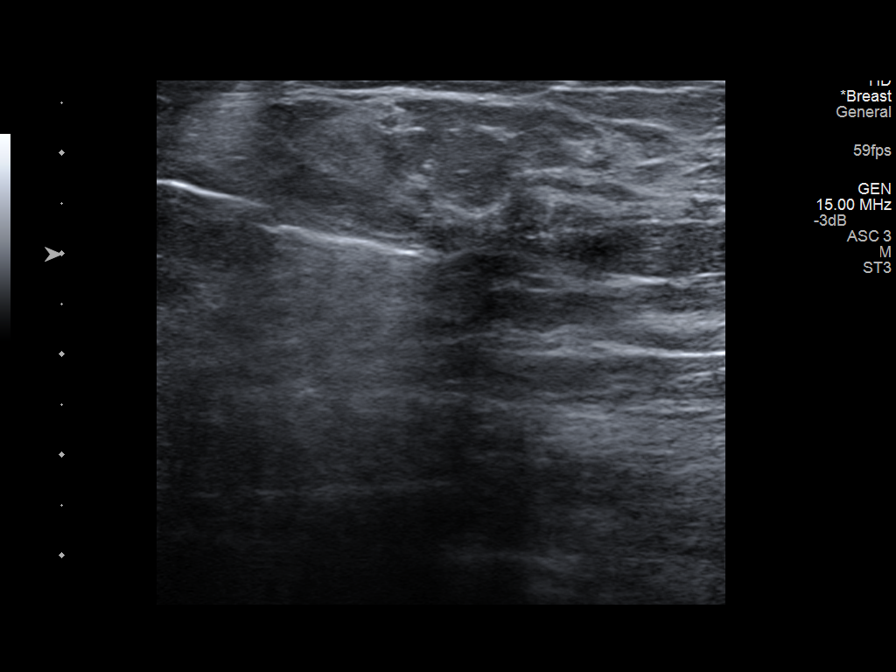
[im 4/7]
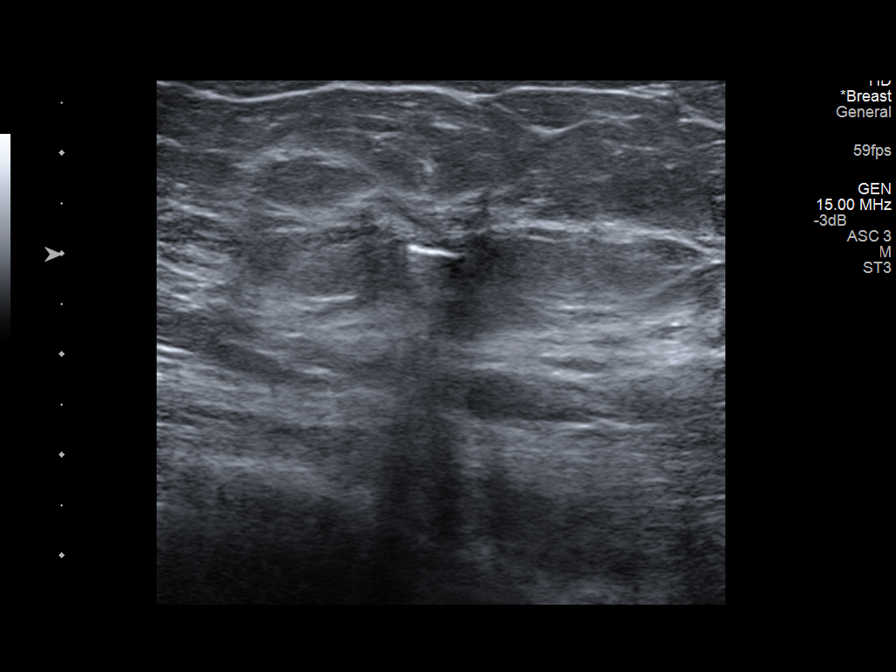
[im 5/7]
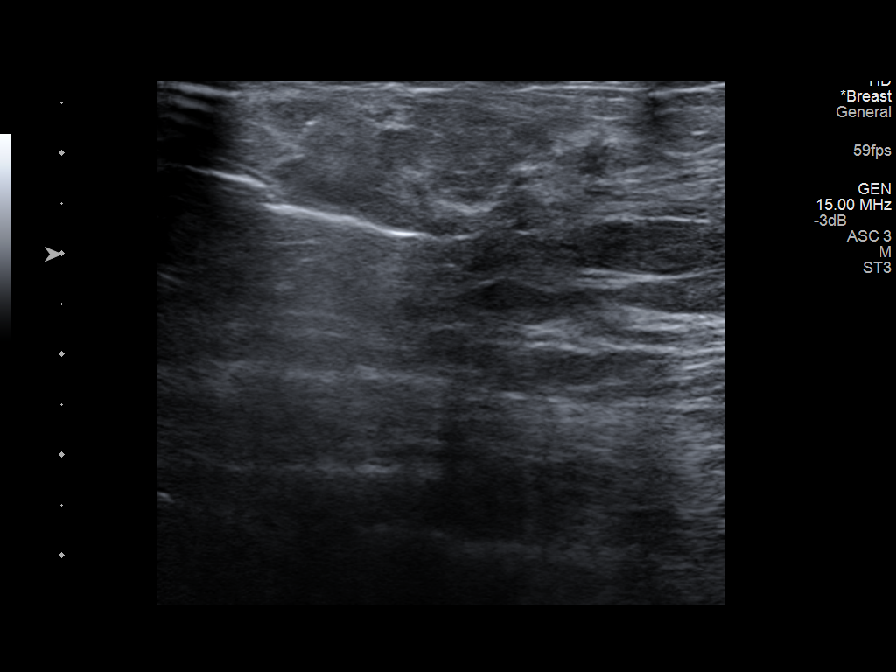
[im 6/7]
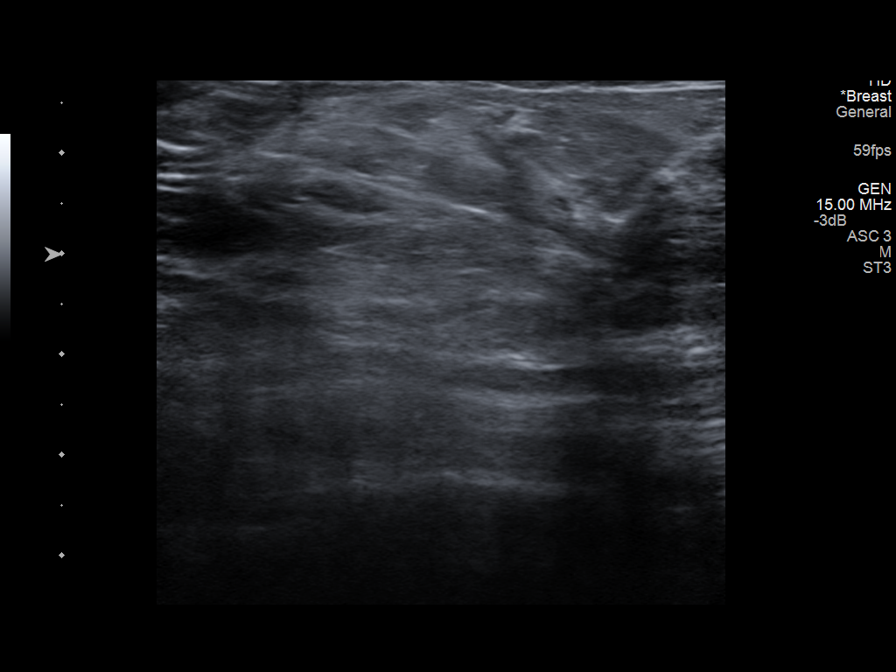
[im 7/7]
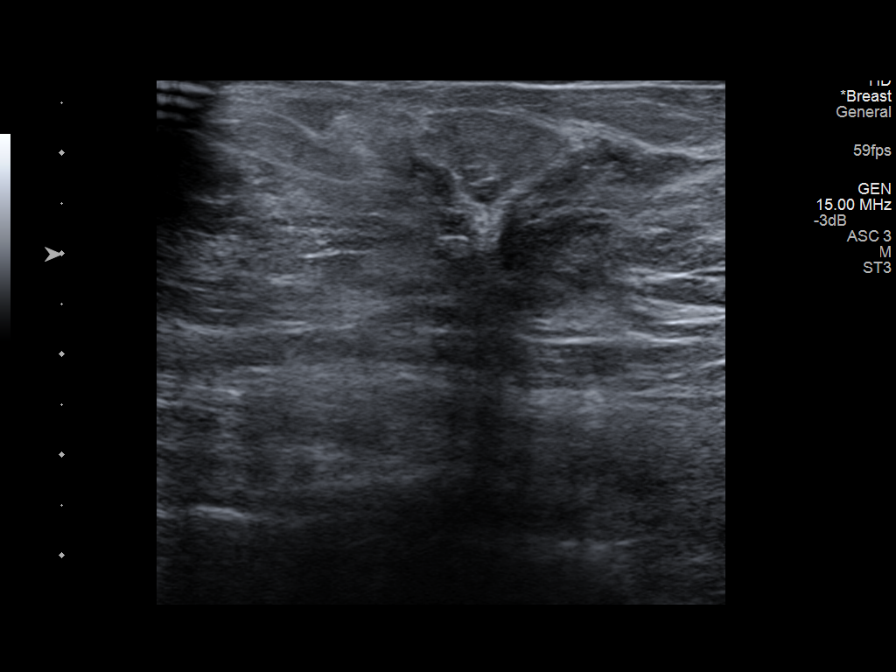

[7 of 7 positions shown; findings below may reference images not displayed]

PROCEDURE:
I met with the patient and we discussed the procedure of
ultrasound-guided biopsy, including benefits and alternatives. We
discussed the high likelihood of a successful procedure. We
discussed the risks of the procedure including infection, bleeding,
tissue injury, clip migration, and inadequate sampling. Informed
written consent was given. The usual time-out protocol was performed
immediately prior to the procedure.

Using sterile technique and 2% Lidocaine as local anesthetic, under
direct ultrasound visualization, a 12 gauge vacuum-assisteddevice
was used to perform biopsy of right breast architectural distortion
9 o'clock position using a lateral approach. At the conclusion of
the procedure, a ribbon shaped tissue marker clip was deployed into
the biopsy cavity. Follow-up 2-view mammogram was performed and
dictated separately.
IMPRESSION: Ultrasound-guided biopsy of right breast architectural distortion.
No apparent complications.

## 2014-03-27 ENCOUNTER — Ambulatory Visit (INDEPENDENT_AMBULATORY_CARE_PROVIDER_SITE_OTHER): Payer: Medicare HMO | Admitting: General Practice

## 2014-03-27 ENCOUNTER — Encounter (INDEPENDENT_AMBULATORY_CARE_PROVIDER_SITE_OTHER): Payer: Medicare HMO

## 2014-03-27 ENCOUNTER — Encounter: Payer: Self-pay | Admitting: General Practice

## 2014-03-27 VITALS — BP 128/79 | HR 83 | Temp 98.4°F | Ht 71.0 in | Wt 287.4 lb

## 2014-03-27 DIAGNOSIS — L0100 Impetigo, unspecified: Secondary | ICD-10-CM

## 2014-03-27 MED ORDER — MUPIROCIN 2 % EX OINT: 1.0000 "application " | TOPICAL_OINTMENT | Freq: Three times a day (TID) | CUTANEOUS | Status: AC

## 2014-03-27 NOTE — Patient Instructions (Signed)
Impetigo Impetigo is an infection of the skin, most common in babies and children.  CAUSES  It is caused by staphylococcal or streptococcal germs (bacteria). Impetigo can start after any damage to the skin. The damage to the skin may be from things like:   Chickenpox.  Scrapes.  Scratches.  Insect bites (common when children scratch the bite).  Cuts.  Nail biting or chewing. Impetigo is contagious. It can be spread from one person to another. Avoid close skin contact, or sharing towels or clothing. SYMPTOMS  Impetigo usually starts out as small blisters or pustules. Then they turn into tiny yellow-crusted sores (lesions).  There may also be:  Large blisters.  Itching or pain.  Pus.  Swollen lymph glands. With scratching, irritation, or non-treatment, these small areas may get larger. Scratching can cause the germs to get under the fingernails; then scratching another part of the skin can cause the infection to be spread there. DIAGNOSIS  Diagnosis of impetigo is usually made by a physical exam. A skin culture (test to grow bacteria) may be done to prove the diagnosis or to help decide the best treatment.  TREATMENT  Mild impetigo can be treated with prescription antibiotic cream. Oral antibiotic medicine may be used in more severe cases. Medicines for itching may be used. HOME CARE INSTRUCTIONS   To avoid spreading impetigo to other body areas:  Keep fingernails short and clean.  Avoid scratching.  Cover infected areas if necessary to keep from scratching.  Gently wash the infected areas with antibiotic soap and water.  Soak crusted areas in warm soapy water using antibiotic soap.  Gently rub the areas to remove crusts. Do not scrub.  Wash hands often to avoid spread this infection.  Keep children with impetigo home from school or daycare until they have used an antibiotic cream for 48 hours (2 days) or oral antibiotic medicine for 24 hours (1 day), and their skin  shows significant improvement.  Children may attend school or daycare if they only have a few sores and if the sores can be covered by a bandage or clothing. SEEK MEDICAL CARE IF:   More blisters or sores show up despite treatment.  Other family members get sores.  Rash is not improving after 48 hours (2 days) of treatment. SEEK IMMEDIATE MEDICAL CARE IF:   You see spreading redness or swelling of the skin around the sores.  You see red streaks coming from the sores.  Your child develops a fever of 100.4 F (37.2 C) or higher.  Your child develops a sore throat.  Your child is acting ill (lethargic, sick to their stomach). Document Released: 12/10/2000 Document Revised: 03/06/2012 Document Reviewed: 10/09/2008 [employer] Patient Information 2014 [employer].  

## 2014-03-27 NOTE — Progress Notes (Signed)
   Subjective:    Patient ID: Tammie [name], female    DOB: 03/23/1941, 73 y.o.   MRN: [id number]  HPI Patient presents with complaints of change in mole on right side (color, size, and shape). She has a history of skin cancer. Reports applying OTC cream to affected area. Complains of crusty drainage at times around affected area.     Review of Systems  Constitutional: Negative for fever and chills.  Respiratory: Negative for chest tightness and shortness of breath.   Cardiovascular: Negative for chest pain and palpitations.  Skin:       Mole to right back area       Objective:   Physical Exam  Constitutional: She appears well-developed and well-nourished.  Cardiovascular: Normal rate, regular rhythm and normal heart sounds.   Pulmonary/Chest: Effort normal and breath sounds normal.  Skin: Skin is warm.  Mole (size of pencil eraser) noted to right back area. Honey crust noted at base.            Assessment & Plan:  1. Impetigo - mupirocin ointment (BACTROBAN) 2 %; Place 1 application into the nose 3 (three) times daily.  Dispense: 22 g; Refill: 0 -refrain from applying OTC creams and ointment -maintain appointment with dermatology -RTO if symptoms worsen -Patient verbalized understanding Erby Pian, FNP-C

## 2014-03-28 ENCOUNTER — Ambulatory Visit: Payer: Medicare HMO | Admitting: Radiation Oncology

## 2014-03-28 ENCOUNTER — Telehealth: Payer: Self-pay | Admitting: General Practice

## 2014-03-28 NOTE — Telephone Encounter (Signed)
I spoke with Tammie [name] and she said yes it is the right medication and that it goes around the spot on her back

## 2014-04-04 ENCOUNTER — Other Ambulatory Visit: Payer: Self-pay

## 2014-04-11 ENCOUNTER — Encounter: Payer: Self-pay | Admitting: Radiation Oncology

## 2014-04-11 ENCOUNTER — Ambulatory Visit
Admission: RE | Admit: 2014-04-11 | Discharge: 2014-04-11 | Disposition: A | Discharge status: Home / Self Care | Payer: Medicare HMO | Source: Ambulatory Visit | Attending: Radiation Oncology | Admitting: Radiation Oncology

## 2014-04-11 VITALS — BP 137/85 | HR 71 | Temp 98.1°F | Resp 20 | Wt 287.0 lb

## 2014-04-11 DIAGNOSIS — C50411 Malignant neoplasm of upper-outer quadrant of right female breast: Secondary | ICD-10-CM

## 2014-04-11 NOTE — Progress Notes (Signed)
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  She is doing well and feeling well. She unfortunately has thrown her back and is wearing a back brace for that. She is some lotion on her skin and having allergic reaction on her right breast associated discontinued use of that. She is pleased with her cosmetic result. She has an appointment with Dr. Marlou Starks next week and then an appointment with [name] for review of his CT scan on May 6.  Allergies:  Allergies  Allergen Reactions  . Tape Hives  . Contrast Media [Iodinated Diagnostic Agents] Hives  . Iohexol Other (See Comments)     Code: HIVES, Desc: hives w/ contrast '08// better w/ benadryl   . Sulfa Antibiotics Other (See Comments)    REACTION: unknown  . Sulfamethoxazole-Trimethoprim Other (See Comments)    REACTION: unknown    Medications:  Current Outpatient Prescriptions  Medication Sig Dispense Refill  . acetaminophen (TYLENOL) 650 MG CR tablet Take 650 mg by mouth every 8 (eight) hours as needed for pain.      Marland Kitchen albuterol (PROVENTIL HFA;VENTOLIN HFA) 108 (90 BASE) MCG/ACT inhaler Inhale 2 puffs into the lungs every 6 (six) hours as needed for wheezing or shortness of breath.  54 g  1  . Calcium Carbonate Antacid (ANTACID PO) Take 1 tablet by mouth daily as needed (for reflux).      Marland Kitchen diphenhydrAMINE (BENADRYL) 25 MG tablet Take 25 mg by mouth at bedtime as needed for sleep.      . diphenhydramine-acetaminophen (TYLENOL PM) 25-500 MG TABS Take 1 tablet by mouth at bedtime as needed (for sleep).       . fluticasone (CUTIVATE) 0.05 % cream       . Garlic 0347 MG CAPS Take 1,000 mg by  mouth 2 (two) times daily.       . hyaluronate sodium (RADIAPLEXRX) GEL Apply 1 application topically 2 (two) times daily.      . hydrochlorothiazide (MICROZIDE) 12.5 MG capsule Take 1 capsule (12.5 mg total) by mouth 2 (two) times daily.  60 capsule  2  . ketoconazole (NIZORAL) 2 % cream Apply 1 application topically 2 (two) times daily.      . metoprolol (LOPRESSOR) 100 MG tablet Take 1 tablet (100 mg total) by mouth 2 (two) times daily.  180 tablet  1  . non-metallic deodorant (ALRA) MISC Apply 1 application topically daily as needed.      . simethicone (MYLICON) 425 MG chewable tablet Chew 125 mg by mouth every 6 (six) hours as needed for flatulence.      . simvastatin (ZOCOR) 10 MG tablet Take 0.5 tablets (5 mg total) by mouth daily.  90 tablet  1  . Vitamins A & D (VITAMIN A & D) ointment Apply 1 application topically 2 (two) times daily.      Marland Kitchen warfarin (COUMADIN) 2.5 MG tablet Take 1 tablet (2.5 mg total) by mouth daily.  90 tablet  1  . [DISCONTINUED] Calcium Citrate-Vitamin D (CALCIUM CITRATE + D) 300-100 MG-UNIT TABS Take 0.5 tablets by mouth 2 (two) times daily.        . [  DISCONTINUED] metoprolol (TOPROL-XL) 100 MG 24 hr tablet Take 100 mg by mouth daily.         No current facility-administered medications for this encounter.    Physical Exam:  Filed Vitals:   04/11/14 1331  BP: 137/85  Pulse: 71  Temp: 98.1 F (36.7 C)  Resp: 20  Weight: 287 lb (130.182 kg)   she has some hyperpigmentation underneath the right breast. There is a rash over the upper inner to upper outer quadrant of the breast along with some redness along the nipple  IMPRESSION: [name] is a 73 y.o. female status post breast conservation radiation with resolving acute effects of treatment  PLAN:  Care looks good. She will discontinue so this lotion. We discussed use of vitamin E extract alone to help with hyperpigmentation. She is going to followup with Dr. Marlou Starks. At [name] sees her twice here for her CAT  scans. I released her from followup with me. We discussed some protection in the treated area. She knows she can, if any questions or concerns.    [name], MD

## 2014-04-11 NOTE — Progress Notes (Signed)
Pt reports pain in her lower back. She states "It slips out every now and then". She is wearing a back brace. She denies other pain, fatigue. She is applying Radiaplex and other lotions to right breast, states her skin "is still darker".

## 2014-04-15 ENCOUNTER — Encounter (INDEPENDENT_AMBULATORY_CARE_PROVIDER_SITE_OTHER): Payer: Self-pay | Admitting: General Surgery

## 2014-04-15 ENCOUNTER — Ambulatory Visit (INDEPENDENT_AMBULATORY_CARE_PROVIDER_SITE_OTHER): Payer: Medicare HMO | Admitting: General Surgery

## 2014-04-15 VITALS — BP 120/70 | HR 78 | Temp 97.3°F | Resp 16 | Ht 71.0 in | Wt 286.0 lb

## 2014-04-15 DIAGNOSIS — C50419 Malignant neoplasm of upper-outer quadrant of unspecified female breast: Secondary | ICD-10-CM

## 2014-04-15 DIAGNOSIS — C50411 Malignant neoplasm of upper-outer quadrant of right female breast: Secondary | ICD-10-CM

## 2014-04-15 NOTE — Patient Instructions (Signed)
Continue regular self exams  

## 2014-04-16 ENCOUNTER — Ambulatory Visit (INDEPENDENT_AMBULATORY_CARE_PROVIDER_SITE_OTHER): Payer: Medicare HMO | Admitting: Pharmacist Clinician (PhC)/ Clinical Pharmacy Specialist

## 2014-04-16 DIAGNOSIS — I4891 Unspecified atrial fibrillation: Secondary | ICD-10-CM

## 2014-04-16 DIAGNOSIS — C50411 Malignant neoplasm of upper-outer quadrant of right female breast: Secondary | ICD-10-CM

## 2014-04-16 DIAGNOSIS — C50419 Malignant neoplasm of upper-outer quadrant of unspecified female breast: Secondary | ICD-10-CM

## 2014-04-16 LAB — POCT INR: INR: 2.2

## 2014-04-16 NOTE — Progress Notes (Signed)
Subjective:     Patient ID: Tammie [name], female   DOB: 05-Aug-1941, 73 y.o.   MRN: [id number]  HPI The patient is a 73 year old white female who is 4 months status post right lumpectomy and negative sentinel node biopsy for a T1 C. N0 right breast cancer. She was a triple negative. She received only radiation therapy and finish this a couple weeks ago. She tolerated this well. She reports only some minor right breast discomfort  Review of Systems  Constitutional: Negative.   HENT: Negative.   Eyes: Negative.   Respiratory: Negative.   Cardiovascular: Negative.   Gastrointestinal: Negative.   Endocrine: Negative.   Genitourinary: Negative.   Musculoskeletal: Negative.   Skin: Negative.   Allergic/Immunologic: Negative.   Neurological: Negative.   Hematological: Negative.   Psychiatric/Behavioral: Negative.        Objective:   Physical Exam  Constitutional: She is oriented to person, place, and time. She appears well-developed and well-nourished.  HENT:  Head: Normocephalic and atraumatic.  Eyes: Conjunctivae and EOM are normal. Pupils are equal, round, and reactive to light.  Neck: Normal range of motion. Neck supple.  Cardiovascular: Normal rate, regular rhythm and normal heart sounds.   Pulmonary/Chest: Effort normal and breath sounds normal.  There is some radiation change to the right breast including some thickening of the skin and some darkening of the skin. There is some mild palpable fullness beneath her scar secondary to the radiation. Otherwise there is no palpable mass in either breast. There is no palpable axillary, supraclavicular, or cervical lymphadenopathy  Abdominal: Soft. Bowel sounds are normal.  Musculoskeletal: Normal range of motion.  Lymphadenopathy:    She has no cervical adenopathy.  Neurological: She is alert and oriented to person, place, and time.  Skin: Skin is warm and dry.  Psychiatric: She has a normal mood and affect. Her behavior is normal.        Assessment:     The patient is 4 months status post right lumpectomy for breast cancer     Plan:     At this point she will continue to recover from the radiation therapy. She will continue to do regular self exams. I will plan to see her back in about 3 months.

## 2014-04-29 ENCOUNTER — Telehealth: Payer: Self-pay | Admitting: *Deleted

## 2014-04-29 NOTE — Telephone Encounter (Signed)
Deidra from radiology called stating that pt's CT scan needed to be r/s to 5/7 due to insurance approval.  F/u with [facility] originally scheduled 5/6.  Per dr Vista Mink, okay to r/s f/u to 5/15 at 9am.  Pt states she cannot make a f/u appt so early.  Informed her that Dr Vista Mink is full for the rest of the month so it would be a few weeks before she got her results.  Pt verbalized understanding, appt given for 6/3 at 1:30pm.  SLJ

## 2014-04-30 ENCOUNTER — Other Ambulatory Visit (HOSPITAL_COMMUNITY): Payer: Medicare Other

## 2014-04-30 ENCOUNTER — Other Ambulatory Visit: Payer: Medicare Other

## 2014-04-30 ENCOUNTER — Ambulatory Visit (HOSPITAL_COMMUNITY): Payer: Medicare HMO

## 2014-05-01 ENCOUNTER — Ambulatory Visit: Payer: Medicare Other | Admitting: Internal Medicine

## 2014-05-02 ENCOUNTER — Ambulatory Visit (HOSPITAL_COMMUNITY)
Admission: RE | Admit: 2014-05-02 | Discharge: 2014-05-02 | Disposition: A | Discharge status: Home / Self Care | Payer: Medicare HMO | Source: Ambulatory Visit | Attending: Internal Medicine | Admitting: Internal Medicine

## 2014-05-02 ENCOUNTER — Other Ambulatory Visit (HOSPITAL_BASED_OUTPATIENT_CLINIC_OR_DEPARTMENT_OTHER): Payer: Medicare HMO

## 2014-05-02 DIAGNOSIS — C50919 Malignant neoplasm of unspecified site of unspecified female breast: Secondary | ICD-10-CM | POA: Insufficient documentation

## 2014-05-02 DIAGNOSIS — N281 Cyst of kidney, acquired: Secondary | ICD-10-CM | POA: Insufficient documentation

## 2014-05-02 DIAGNOSIS — I700 Atherosclerosis of aorta: Secondary | ICD-10-CM | POA: Insufficient documentation

## 2014-05-02 DIAGNOSIS — R918 Other nonspecific abnormal finding of lung field: Secondary | ICD-10-CM | POA: Insufficient documentation

## 2014-05-02 DIAGNOSIS — I2510 Atherosclerotic heart disease of native coronary artery without angina pectoris: Secondary | ICD-10-CM | POA: Insufficient documentation

## 2014-05-02 DIAGNOSIS — N200 Calculus of kidney: Secondary | ICD-10-CM | POA: Insufficient documentation

## 2014-05-02 DIAGNOSIS — K8020 Calculus of gallbladder without cholecystitis without obstruction: Secondary | ICD-10-CM | POA: Insufficient documentation

## 2014-05-02 DIAGNOSIS — M47814 Spondylosis without myelopathy or radiculopathy, thoracic region: Secondary | ICD-10-CM | POA: Insufficient documentation

## 2014-05-02 DIAGNOSIS — C3490 Malignant neoplasm of unspecified part of unspecified bronchus or lung: Secondary | ICD-10-CM | POA: Insufficient documentation

## 2014-05-02 DIAGNOSIS — C3410 Malignant neoplasm of upper lobe, unspecified bronchus or lung: Secondary | ICD-10-CM

## 2014-05-02 DIAGNOSIS — R599 Enlarged lymph nodes, unspecified: Secondary | ICD-10-CM | POA: Insufficient documentation

## 2014-05-02 LAB — CBC WITH DIFFERENTIAL/PLATELET
BASO%: 1.1 % (ref 0.0–2.0)
BASOS ABS: 0.1 10*3/uL (ref 0.0–0.1)
EOS%: 3.2 % (ref 0.0–7.0)
Eosinophils Absolute: 0.2 10*3/uL (ref 0.0–0.5)
HEMATOCRIT: 42.8 % (ref 34.8–46.6)
HGB: 13.8 g/dL (ref 11.6–15.9)
LYMPH#: 1 10*3/uL (ref 0.9–3.3)
LYMPH%: 21.5 % (ref 14.0–49.7)
MCH: 27.5 pg (ref 25.1–34.0)
MCHC: 32.2 g/dL (ref 31.5–36.0)
MCV: 85.4 fL (ref 79.5–101.0)
MONO#: 0.4 10*3/uL (ref 0.1–0.9)
MONO%: 7.8 % (ref 0.0–14.0)
NEUT#: 3.2 10*3/uL (ref 1.5–6.5)
NEUT%: 66.4 % (ref 38.4–76.8)
PLATELETS: 188 10*3/uL (ref 145–400)
RBC: 5.01 10*6/uL (ref 3.70–5.45)
RDW: 14.2 % (ref 11.2–14.5)
WBC: 4.8 10*3/uL (ref 3.9–10.3)

## 2014-05-02 LAB — COMPREHENSIVE METABOLIC PANEL (CC13)
ALT: 12 U/L (ref 0–55)
ANION GAP: 15 meq/L — AB (ref 3–11)
AST: 16 U/L (ref 5–34)
Albumin: 3.7 g/dL (ref 3.5–5.0)
Alkaline Phosphatase: 62 U/L (ref 40–150)
BUN: 15.2 mg/dL (ref 7.0–26.0)
CALCIUM: 9.9 mg/dL (ref 8.4–10.4)
CHLORIDE: 106 meq/L (ref 98–109)
CO2: 25 mEq/L (ref 22–29)
CREATININE: 0.8 mg/dL (ref 0.6–1.1)
Glucose: 106 mg/dl (ref 70–140)
Potassium: 4.2 mEq/L (ref 3.5–5.1)
Sodium: 145 mEq/L (ref 136–145)
Total Bilirubin: 0.56 mg/dL (ref 0.20–1.20)
Total Protein: 7.1 g/dL (ref 6.4–8.3)

## 2014-05-12 IMAGING — CT CT CHEST W/O CM
1 of 2 series · 14 of 32 positions shown, 18 images · non-contrast
Comparison: 03/02/2012

CLINICAL DATA: Lung cancer diagnosed 3800, status post left upper
lobectomy, cough, shortness of breath

CT CHEST WITHOUT CONTRAST
TECHNIQUE: Multidetector CT imaging of the chest was performed
following the standard protocol without IV contrast.

[Series 2: chest w/o st · axial · non-contrast · 0.81mm/px · z∈[-440,-195]mm · 14 of 59 slices shown, 18 images]
[im 5/59  mediastinal]
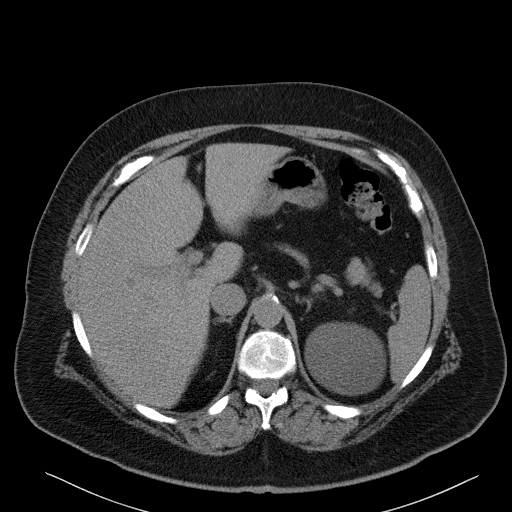
[im 5/59  lung]
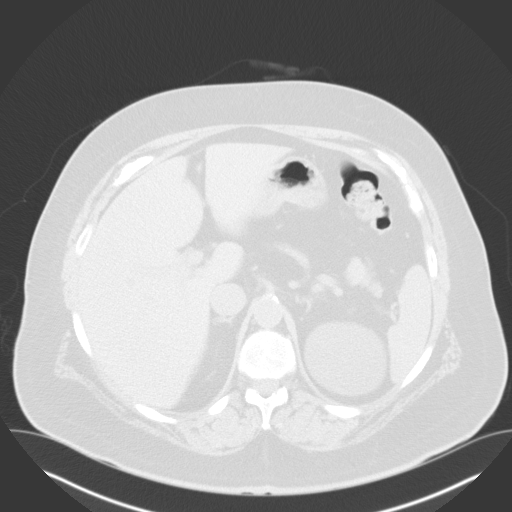
[im 9/59  lung]
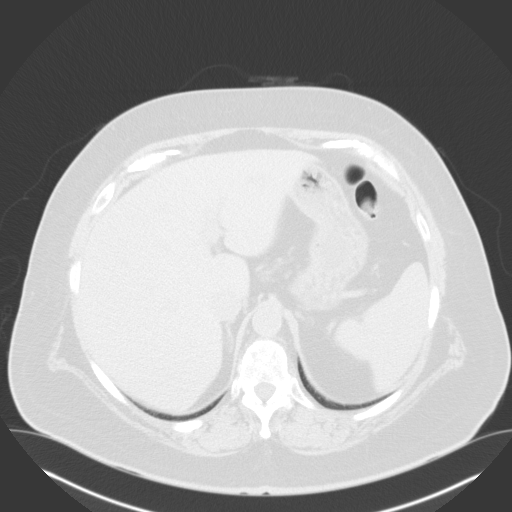
[im 14/59  lung]
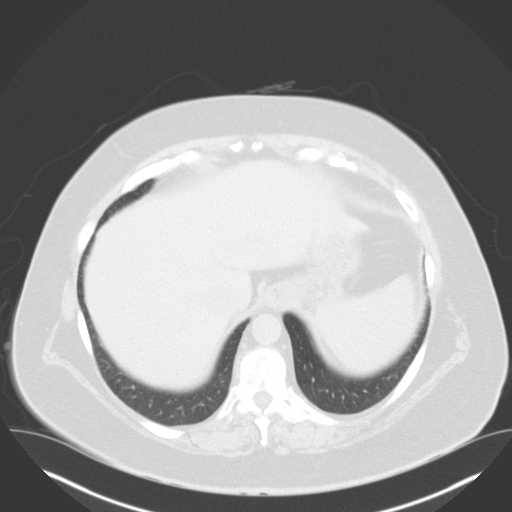
[im 18/59  lung]
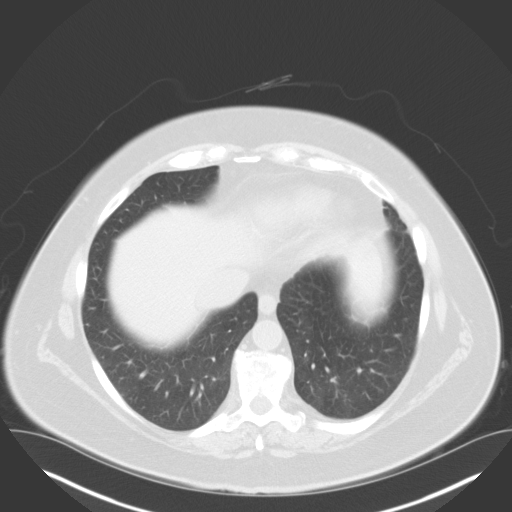
[im 23/59  mediastinal]
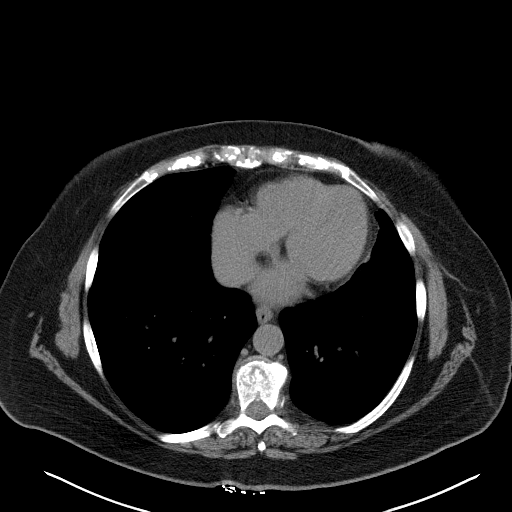
[im 23/59  lung]
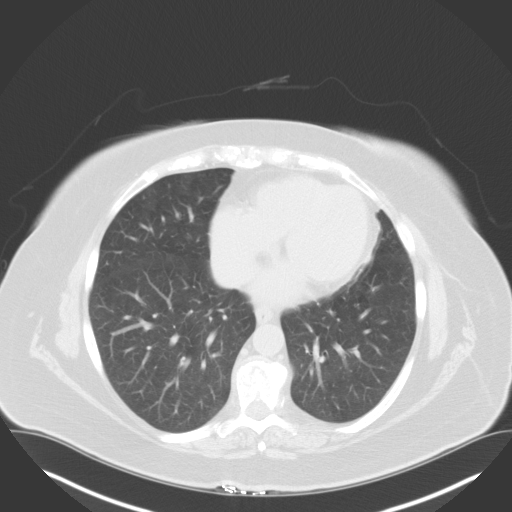
[im 27/59  lung]
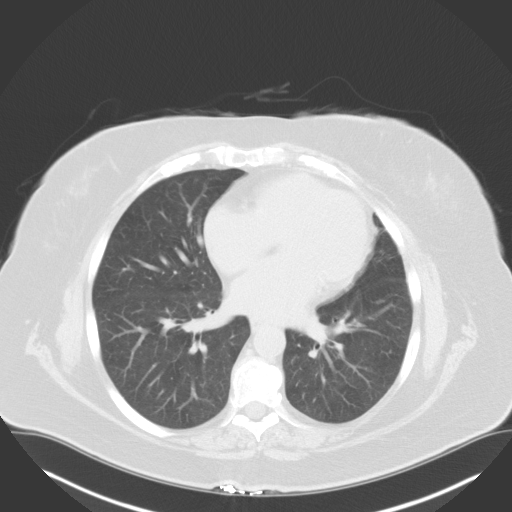
[im 28/59  lung]
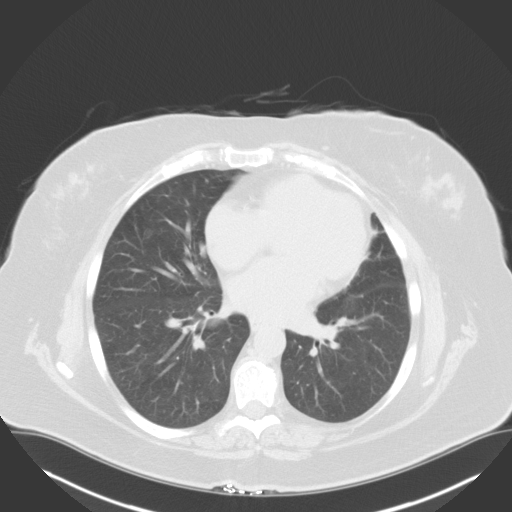
[im 30/59  lung]
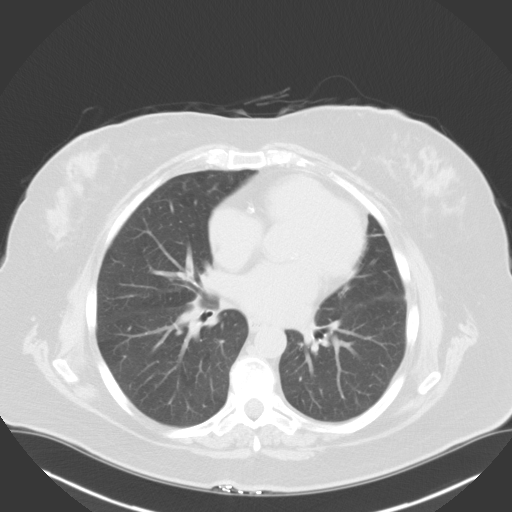
[im 32/59  mediastinal]
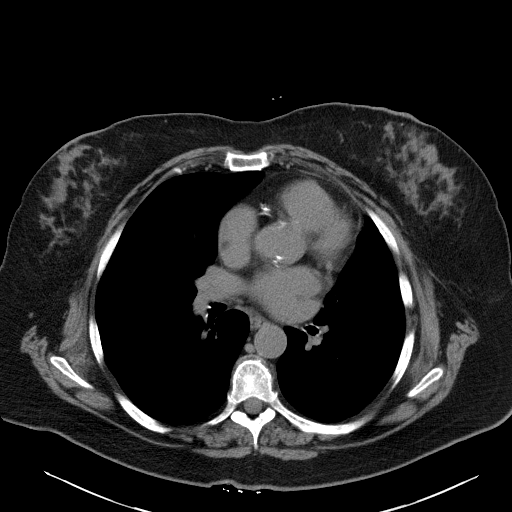
[im 32/59  lung]
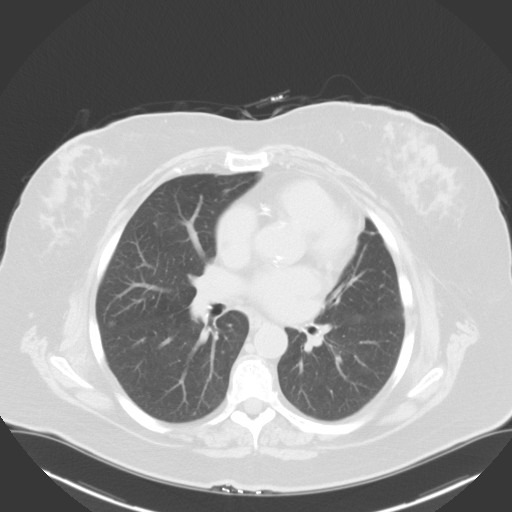
[im 36/59  lung]
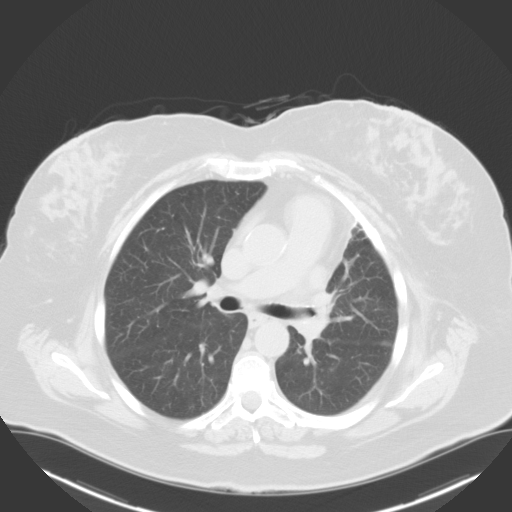
[im 41/59  lung]
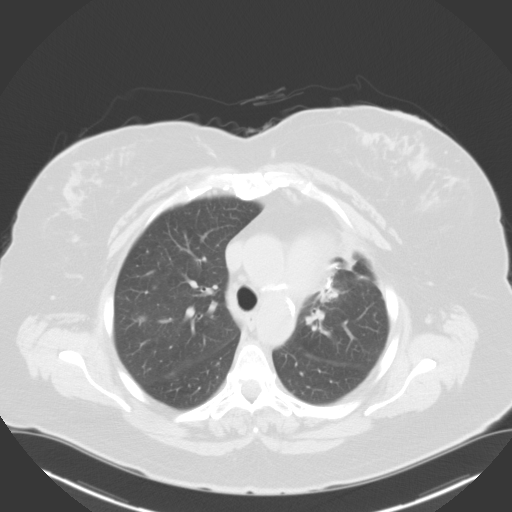
[im 45/59  lung]
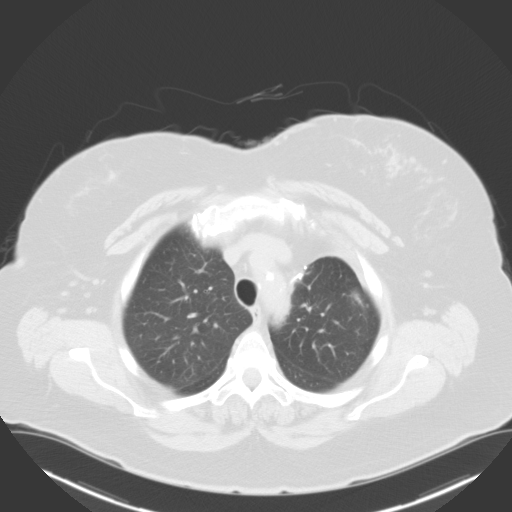
[im 50/59  mediastinal]
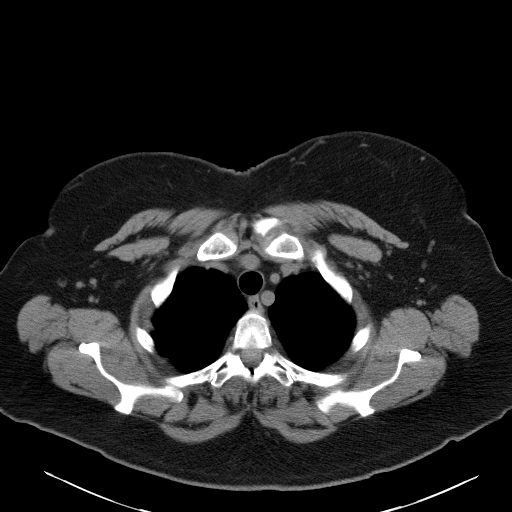
[im 50/59  lung]
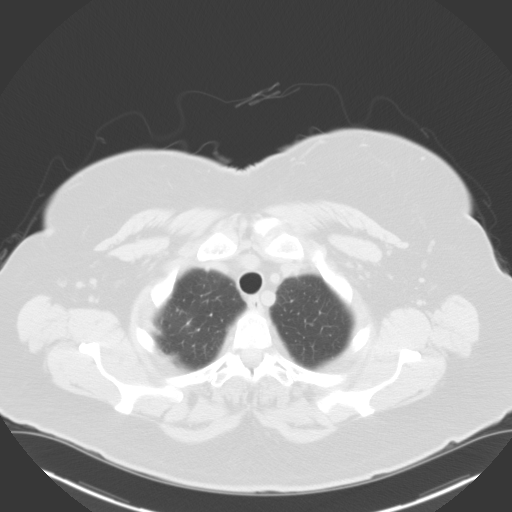
[im 54/59  lung]
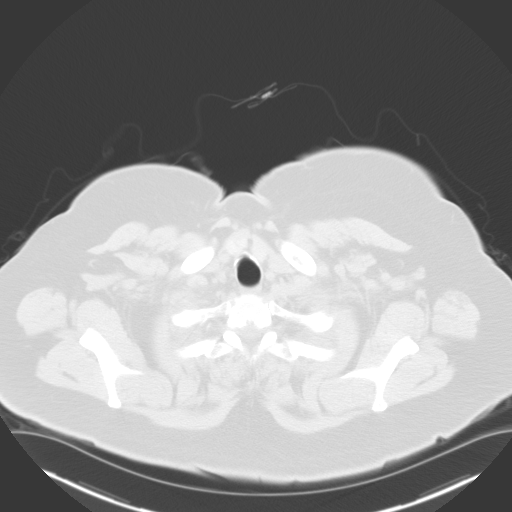

[14 of 32 positions shown; findings below may reference images not displayed]

FINDINGS: Prior left upper lobe wedge resection.

9 x 7 mm ground-glass nodule in the left lower lobe (series 7/image
23), grossly unchanged versus minimally increased.

16 x 11 mm ground-glass nodule in the superior segment right lower
lobe (series 7/image 21), unchanged.

Subsolid ground-glass nodule with 7 mm solid component in the
posterior right upper lobe (series 7/image 20), grossly unchanged.

Additional mild nodularity in the right lung.  Prior bilateral
lower lobe tree-in-bud ground-glass nodularity has resolved and was
likely infectious/inflammatory.

Visualized thyroid is unremarkable.

The heart is normal in size.  No pericardial effusion.  Coronary
atherosclerosis.  Atherosclerotic calcifications of the aortic
arch.

No suspicious mediastinal or axillary lymphadenopathy.

Visualized upper abdomen is notable for cholelithiasis and a large
left renal cyst, incompletely visualized.

Mild degenerative changes of the visualized thoracolumbar spine.
IMPRESSION: Status post left upper lobe wedge resection.

Ground-glass / subsolid pulmonary nodules in the right upper and
bilateral lower lobes, as described above, grossly unchanged.  This
appearance remains suspicious for multifocal low grade
adenocarcinoma.

Prior bilateral lower lobe tree-in-bud ground-glass nodularity has
resolved, likely infectious/inflammatory.

## 2014-05-28 ENCOUNTER — Ambulatory Visit (INDEPENDENT_AMBULATORY_CARE_PROVIDER_SITE_OTHER): Payer: Medicare HMO | Admitting: Pharmacist Clinician (PhC)/ Clinical Pharmacy Specialist

## 2014-05-28 DIAGNOSIS — I4891 Unspecified atrial fibrillation: Secondary | ICD-10-CM

## 2014-05-28 LAB — POCT INR: INR: 2.4

## 2014-05-29 ENCOUNTER — Telehealth: Payer: Self-pay | Admitting: Internal Medicine

## 2014-05-29 ENCOUNTER — Encounter: Payer: Self-pay | Admitting: Internal Medicine

## 2014-05-29 ENCOUNTER — Ambulatory Visit (HOSPITAL_BASED_OUTPATIENT_CLINIC_OR_DEPARTMENT_OTHER): Payer: Medicare HMO | Admitting: Internal Medicine

## 2014-05-29 VITALS — BP 164/82 | HR 79 | Temp 98.3°F | Resp 19 | Ht 71.0 in | Wt 284.2 lb

## 2014-05-29 DIAGNOSIS — C3410 Malignant neoplasm of upper lobe, unspecified bronchus or lung: Secondary | ICD-10-CM

## 2014-05-29 DIAGNOSIS — C50919 Malignant neoplasm of unspecified site of unspecified female breast: Secondary | ICD-10-CM

## 2014-05-29 DIAGNOSIS — C3490 Malignant neoplasm of unspecified part of unspecified bronchus or lung: Secondary | ICD-10-CM

## 2014-05-29 DIAGNOSIS — Z171 Estrogen receptor negative status [ER-]: Secondary | ICD-10-CM

## 2014-05-29 NOTE — Telephone Encounter (Signed)
gv pt appt schedule for june. [facility] will call pt w/pet scan appt - pt aware.
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
## 2014-06-11 ENCOUNTER — Ambulatory Visit (HOSPITAL_COMMUNITY)
Admission: RE | Admit: 2014-06-11 | Discharge: 2014-06-11 | Disposition: A | Discharge status: Home / Self Care | Payer: Medicare HMO | Source: Ambulatory Visit | Attending: Internal Medicine | Admitting: Internal Medicine

## 2014-06-11 DIAGNOSIS — C3490 Malignant neoplasm of unspecified part of unspecified bronchus or lung: Secondary | ICD-10-CM | POA: Insufficient documentation

## 2014-06-11 LAB — GLUCOSE, CAPILLARY: GLUCOSE-CAPILLARY: 110 mg/dL — AB (ref 70–99)

## 2014-06-11 MED ORDER — FLUDEOXYGLUCOSE F - 18 (FDG) INJECTION
14.3000 | Freq: Once | INTRAVENOUS | Status: AC | PRN
  Administered 2014-06-11: 14.3 via INTRAVENOUS

## 2014-06-19 ENCOUNTER — Ambulatory Visit (HOSPITAL_BASED_OUTPATIENT_CLINIC_OR_DEPARTMENT_OTHER): Payer: Medicare HMO | Admitting: Internal Medicine

## 2014-06-19 ENCOUNTER — Encounter: Payer: Self-pay | Admitting: Internal Medicine

## 2014-06-19 ENCOUNTER — Telehealth: Payer: Self-pay | Admitting: *Deleted

## 2014-06-19 VITALS — BP 180/93 | HR 74 | Temp 98.0°F | Resp 20 | Ht 71.0 in | Wt 284.1 lb

## 2014-06-19 DIAGNOSIS — C3410 Malignant neoplasm of upper lobe, unspecified bronchus or lung: Secondary | ICD-10-CM

## 2014-06-19 DIAGNOSIS — C3412 Malignant neoplasm of upper lobe, left bronchus or lung: Secondary | ICD-10-CM

## 2014-06-19 DIAGNOSIS — C50919 Malignant neoplasm of unspecified site of unspecified female breast: Secondary | ICD-10-CM

## 2014-06-19 DIAGNOSIS — R918 Other nonspecific abnormal finding of lung field: Secondary | ICD-10-CM
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
## 2014-06-19 NOTE — Telephone Encounter (Signed)
Called with appt for Dr. Roxan Hockey on 06/25/14 at 3:45.  Left vm message with time and place of appt

## 2014-06-25 ENCOUNTER — Encounter: Payer: Self-pay | Admitting: Thoracic Surgery (Cardiothoracic Vascular Surgery)

## 2014-06-25 ENCOUNTER — Institutional Professional Consult (permissible substitution) (INDEPENDENT_AMBULATORY_CARE_PROVIDER_SITE_OTHER): Payer: Medicare HMO | Admitting: Thoracic Surgery (Cardiothoracic Vascular Surgery)

## 2014-06-25 VITALS — BP 150/86 | HR 80 | Resp 20 | Ht 71.0 in | Wt 284.0 lb

## 2014-06-25 DIAGNOSIS — R911 Solitary pulmonary nodule: Secondary | ICD-10-CM

## 2014-06-25 DIAGNOSIS — Z85118 Personal history of other malignant neoplasm of bronchus and lung: Secondary | ICD-10-CM
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
## 2014-06-26 ENCOUNTER — Other Ambulatory Visit: Payer: Self-pay | Admitting: *Deleted

## 2014-06-26 DIAGNOSIS — C3490 Malignant neoplasm of unspecified part of unspecified bronchus or lung: Secondary | ICD-10-CM

## 2014-07-02 ENCOUNTER — Encounter: Payer: Self-pay | Admitting: Thoracic Surgery (Cardiothoracic Vascular Surgery)

## 2014-07-02 ENCOUNTER — Ambulatory Visit (INDEPENDENT_AMBULATORY_CARE_PROVIDER_SITE_OTHER): Payer: Medicare HMO | Admitting: Thoracic Surgery (Cardiothoracic Vascular Surgery)

## 2014-07-02 ENCOUNTER — Ambulatory Visit (HOSPITAL_COMMUNITY)
Admission: RE | Admit: 2014-07-02 | Discharge: 2014-07-02 | Disposition: A | Discharge status: Home / Self Care | Payer: Medicare HMO | Source: Ambulatory Visit | Attending: Thoracic Surgery (Cardiothoracic Vascular Surgery) | Admitting: Thoracic Surgery (Cardiothoracic Vascular Surgery)

## 2014-07-02 VITALS — BP 150/79 | HR 79 | Resp 20 | Ht 71.0 in | Wt 284.0 lb

## 2014-07-02 DIAGNOSIS — R911 Solitary pulmonary nodule: Secondary | ICD-10-CM

## 2014-07-02 DIAGNOSIS — C3490 Malignant neoplasm of unspecified part of unspecified bronchus or lung: Secondary | ICD-10-CM

## 2014-07-02 DIAGNOSIS — Z85118 Personal history of other malignant neoplasm of bronchus and lung: Secondary | ICD-10-CM

## 2014-07-02 LAB — PULMONARY FUNCTION TEST
DL/VA % PRED: 116 %
DL/VA: 6.34 ml/min/mmHg/L
DLCO COR % PRED: 80 %
DLCO UNC: 26.17 ml/min/mmHg
DLCO cor: 26.17 ml/min/mmHg
DLCO unc % pred: 80 %
FEF 25-75 POST: 0.96 L/s
FEF 25-75 Pre: 1.15 L/sec
FEF2575-%CHANGE-POST: -16 %
FEF2575-%PRED-PRE: 54 %
FEF2575-%Pred-Post: 45 %
FEV1-%Change-Post: -3 %
FEV1-%PRED-POST: 61 %
FEV1-%Pred-Pre: 63 %
FEV1-Post: 1.69 L
FEV1-Pre: 1.75 L
FEV1FVC-%CHANGE-POST: 0 %
FEV1FVC-%Pred-Pre: 96 %
FEV6-%CHANGE-POST: -3 %
FEV6-%PRED-PRE: 68 %
FEV6-%Pred-Post: 66 %
FEV6-PRE: 2.41 L
FEV6-Post: 2.32 L
FEV6FVC-%Change-Post: 0 %
FEV6FVC-%PRED-POST: 103 %
FEV6FVC-%Pred-Pre: 103 %
FVC-%CHANGE-POST: -3 %
FVC-%Pred-Post: 64 %
FVC-%Pred-Pre: 66 %
FVC-PRE: 2.43 L
FVC-Post: 2.34 L
PRE FEV1/FVC RATIO: 72 %
Post FEV1/FVC ratio: 72 %
Post FEV6/FVC ratio: 99 %
Pre FEV6/FVC Ratio: 99 %
RV % PRED: 62 %
RV: 1.59 L
TLC % pred: 70 %
TLC: 4.19 L

## 2014-07-02 MED ORDER — ALBUTEROL SULFATE (2.5 MG/3ML) 0.083% IN NEBU
2.5000 mg | INHALATION_SOLUTION | Freq: Once | RESPIRATORY_TRACT | Status: AC
  Administered 2014-07-02: 2.5 mg via RESPIRATORY_TRACT

## 2014-07-03 ENCOUNTER — Other Ambulatory Visit: Payer: Self-pay | Admitting: *Deleted

## 2014-07-03 DIAGNOSIS — R918 Other nonspecific abnormal finding of lung field: Secondary | ICD-10-CM

## 2014-07-03 MED ORDER — ENOXAPARIN SODIUM 120 MG/0.8ML ~~LOC~~ SOLN
120.0000 mg | Freq: Two times a day (BID) | SUBCUTANEOUS | Status: DC
Start: 2014-07-11 — End: 2014-07-09

## 2014-07-03 NOTE — Addendum Note (Signed)
Addended by: Modesto Charon MD C on: 07/03/2014 01:58 PM   Modules accepted: Orders
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
## 2014-07-08 ENCOUNTER — Encounter (HOSPITAL_COMMUNITY): Payer: Self-pay | Admitting: Pharmacy Technician

## 2014-07-08 ENCOUNTER — Other Ambulatory Visit: Payer: Self-pay | Admitting: Nurse Practitioner

## 2014-07-09 ENCOUNTER — Other Ambulatory Visit: Payer: Self-pay | Admitting: *Deleted

## 2014-07-09 DIAGNOSIS — Z7901 Long term (current) use of anticoagulants: Secondary | ICD-10-CM

## 2014-07-09 MED ORDER — ENOXAPARIN SODIUM 120 MG/0.8ML ~~LOC~~ SOLN: 120.0000 mg | Freq: Two times a day (BID) | SUBCUTANEOUS | Status: AC

## 2014-07-11 ENCOUNTER — Encounter (HOSPITAL_COMMUNITY)
Admission: RE | Admit: 2014-07-11 | Discharge: 2014-07-11 | Disposition: A | Discharge status: Home / Self Care | Payer: Medicare HMO | Source: Ambulatory Visit | Attending: Thoracic Surgery (Cardiothoracic Vascular Surgery) | Admitting: Thoracic Surgery (Cardiothoracic Vascular Surgery)

## 2014-07-11 ENCOUNTER — Other Ambulatory Visit: Payer: Self-pay

## 2014-07-11 ENCOUNTER — Encounter (HOSPITAL_COMMUNITY): Payer: Self-pay

## 2014-07-11 VITALS — BP 160/78 | HR 75 | Temp 97.8°F | Resp 20 | Ht 71.0 in | Wt 281.1 lb

## 2014-07-11 DIAGNOSIS — R918 Other nonspecific abnormal finding of lung field: Secondary | ICD-10-CM

## 2014-07-11 DIAGNOSIS — Z01812 Encounter for preprocedural laboratory examination: Secondary | ICD-10-CM | POA: Insufficient documentation

## 2014-07-11 HISTORY — DX: Chronic obstructive pulmonary disease, unspecified: J44.9

## 2014-07-11 HISTORY — DX: Cardiac arrhythmia, unspecified: I49.9

## 2014-07-11 HISTORY — DX: Shortness of breath: R06.02

## 2014-07-11 LAB — BLOOD GAS, ARTERIAL
Acid-Base Excess: 4.1 mmol/L — ABNORMAL HIGH (ref 0.0–2.0)
Bicarbonate: 28.3 mEq/L — ABNORMAL HIGH (ref 20.0–24.0)
DRAWN BY: 344381
FIO2: 0.21 %
O2 SAT: 95.1 %
PATIENT TEMPERATURE: 98.6
PH ART: 7.427 (ref 7.350–7.450)
TCO2: 29.6 mmol/L (ref 0–100)
pCO2 arterial: 43.7 mmHg (ref 35.0–45.0)
pO2, Arterial: 73.7 mmHg — ABNORMAL LOW (ref 80.0–100.0)

## 2014-07-11 LAB — PROTIME-INR
INR: 1.91 — AB (ref 0.00–1.49)
Prothrombin Time: 21.9 seconds — ABNORMAL HIGH (ref 11.6–15.2)

## 2014-07-11 LAB — COMPREHENSIVE METABOLIC PANEL
ALBUMIN: 3.8 g/dL (ref 3.5–5.2)
ALT: 14 U/L (ref 0–35)
ANION GAP: 17 — AB (ref 5–15)
AST: 23 U/L (ref 0–37)
Alkaline Phosphatase: 55 U/L (ref 39–117)
BUN: 17 mg/dL (ref 6–23)
CALCIUM: 9 mg/dL (ref 8.4–10.5)
CO2: 23 mEq/L (ref 19–32)
Chloride: 102 mEq/L (ref 96–112)
Creatinine, Ser: 0.65 mg/dL (ref 0.50–1.10)
GFR calc non Af Amer: 87 mL/min — ABNORMAL LOW (ref >90)
GLUCOSE: 102 mg/dL — AB (ref 70–99)
Potassium: 4.6 mEq/L (ref 3.7–5.3)
Sodium: 142 mEq/L (ref 137–147)
Total Bilirubin: 0.4 mg/dL (ref 0.3–1.2)
Total Protein: 7.3 g/dL (ref 6.0–8.3)

## 2014-07-11 LAB — URINALYSIS, ROUTINE W REFLEX MICROSCOPIC
Bilirubin Urine: NEGATIVE
GLUCOSE, UA: NEGATIVE mg/dL
KETONES UR: NEGATIVE mg/dL
Leukocytes, UA: NEGATIVE
Nitrite: NEGATIVE
Protein, ur: 30 mg/dL — AB
Specific Gravity, Urine: 1.026 (ref 1.005–1.030)
Urobilinogen, UA: 1 mg/dL (ref 0.0–1.0)
pH: 6 (ref 5.0–8.0)

## 2014-07-11 LAB — CBC
HCT: 43.1 % (ref 36.0–46.0)
HEMOGLOBIN: 13.8 g/dL (ref 12.0–15.0)
MCH: 27.4 pg (ref 26.0–34.0)
MCHC: 32 g/dL (ref 30.0–36.0)
MCV: 85.5 fL (ref 78.0–100.0)
Platelets: 170 10*3/uL (ref 150–400)
RBC: 5.04 MIL/uL (ref 3.87–5.11)
RDW: 14.2 % (ref 11.5–15.5)
WBC: 6 10*3/uL (ref 4.0–10.5)

## 2014-07-11 LAB — URINE MICROSCOPIC-ADD ON

## 2014-07-11 LAB — SURGICAL PCR SCREEN
MRSA, PCR: NEGATIVE
Staphylococcus aureus: POSITIVE — AB

## 2014-07-11 LAB — APTT: aPTT: 39 seconds — ABNORMAL HIGH (ref 24–37)

## 2014-07-11 LAB — TYPE AND SCREEN
ABO/RH(D): O POS
Antibody Screen: NEGATIVE

## 2014-07-11 NOTE — Progress Notes (Signed)
I called a prescription to [facility] [place] for Mupirocin ointment,ent.

## 2014-07-11 NOTE — Pre-Procedure Instructions (Signed)
Tammie [name]  07/11/2014   Your procedure is scheduled on:    Monday  07/15/14  Report to [facility] Admitting at 530 AM.  Call this number if you have problems the morning of surgery: [phone]   Remember:   Do not eat food or drink liquids after midnight.   Take these medicines the morning of surgery with A SIP OF WATER:  TYLENOL IF NEEDED, ALBUTEROL, ANTACID IF NEEDED, METOPROLOL(LOPRESSOR)  (STOP GARLIC)   Do not wear jewelry, make-up or nail polish.  Do not wear lotions, powders, or perfumes. You may wear deodorant.  Do not shave 48 hours prior to surgery. Men may shave face and neck.  Do not bring valuables to the hospital.  [facility] is not responsible                  for any belongings or valuables.               Contacts, dentures or bridgework may not be worn into surgery.  Leave suitcase in the car. After surgery it may be brought to your room.  For patients admitted to the hospital, discharge time is determined by your                treatment team.               Patients discharged the day of surgery will not be allowed to drive  home.  Name and phone number of your driver:  Special Instructions:  Special Instructions: [place] - Preparing for Surgery  Before surgery, you can play an important role.  Because skin is not sterile, your skin needs to be as free of germs as possible.  You can reduce the number of germs on you skin by washing with CHG (chlorahexidine gluconate) soap before surgery.  CHG is an antiseptic cleaner which kills germs and bonds with the skin to continue killing germs even after washing.  Please DO NOT use if you have an allergy to CHG or antibacterial soaps.  If your skin becomes reddened/irritated stop using the CHG and inform your nurse when you arrive at Short Stay.  Do not shave (including legs and underarms) for at least 48 hours prior to the first CHG shower.  You may shave your face.  Please follow these instructions  carefully:   1.  Shower with CHG Soap the night before surgery and the morning of Surgery.  2.  If you choose to wash your hair, wash your hair first as usual with your normal shampoo.  3.  After you shampoo, rinse your hair and body thoroughly to remove the Shampoo.  4.  Use CHG as you would any other liquid soap. You can apply chg directly to the skin and wash gently with scrungie or a clean washcloth.  5.  Apply the CHG Soap to your body ONLY FROM THE NECK DOWN.  Do not use on open wounds or open sores.  Avoid contact with your eyes, ears, mouth and genitals (private parts).  Wash genitals (private parts with your normal soap.  6.  Wash thoroughly, paying special attention to the area where your surgery will be performed.  7.  Thoroughly rinse your body with warm water from the neck down.  8.  DO NOT shower/wash with your normal soap after using and rinsing off the CHG Soap.  9.  Pat yourself dry with a clean towel.  10.  Wear clean pajamas.            11.  Place clean sheets on your bed the night of your first shower and do not sleep with pets.  Day of Surgery  Do not apply any lotions/deodorants the morning of surgery.  Please wear clean clothes to the hospital/surgery center.   Please read over the following fact sheets that you were given: Pain Booklet, Coughing and Deep Breathing, Blood Transfusion Information, MRSA Information and Surgical Site Infection Prevention

## 2014-07-11 NOTE — Progress Notes (Signed)
07/11/14 1331  OBSTRUCTIVE SLEEP APNEA  Have you ever been diagnosed with sleep apnea through a sleep study? No  Do you snore loudly (loud enough to be heard through closed doors)?  0  Do you often feel tired, fatigued, or sleepy during the daytime? 1  Has anyone observed you stop breathing during your sleep? 0  Do you have, or are you being treated for high blood pressure? 1  BMI more than 35 kg/m2? 1  Age over 73 years old? 1  Neck circumference greater than 40 cm/16 inches? 1  Gender: 0  Obstructive Sleep Apnea Score 5  Score 4 or greater  Results sent to PCP
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
## 2014-07-14 MED ORDER — DEXTROSE 5 % IV SOLN
1.5000 g | INTRAVENOUS | Status: AC
  Administered 2014-07-15: 1.5 g via INTRAVENOUS
  Filled 2014-07-14: qty 1.5

## 2014-07-15 ENCOUNTER — Inpatient Hospital Stay (HOSPITAL_COMMUNITY): Payer: Medicare HMO

## 2014-07-15 ENCOUNTER — Inpatient Hospital Stay (HOSPITAL_COMMUNITY): Payer: Medicare HMO | Admitting: Certified Registered"

## 2014-07-15 ENCOUNTER — Encounter (HOSPITAL_COMMUNITY): Payer: Medicare HMO | Admitting: Vascular Surgery

## 2014-07-15 ENCOUNTER — Inpatient Hospital Stay (HOSPITAL_COMMUNITY)
Admission: RE | Admit: 2014-07-15 | Discharge: 2014-07-20 | DRG: 164 | Disposition: A | Discharge status: Home / Self Care | Payer: Medicare HMO | Source: Ambulatory Visit | Attending: Thoracic Surgery (Cardiothoracic Vascular Surgery) | Admitting: Thoracic Surgery (Cardiothoracic Vascular Surgery)

## 2014-07-15 ENCOUNTER — Encounter (HOSPITAL_COMMUNITY): Payer: Self-pay | Admitting: Certified Registered"

## 2014-07-15 ENCOUNTER — Encounter (HOSPITAL_COMMUNITY)
Admission: RE | Disposition: A | Discharge status: Home / Self Care | Payer: Self-pay | Source: Ambulatory Visit | Attending: Thoracic Surgery (Cardiothoracic Vascular Surgery)

## 2014-07-15 DIAGNOSIS — C3410 Malignant neoplasm of upper lobe, unspecified bronchus or lung: Secondary | ICD-10-CM | POA: Diagnosis present

## 2014-07-15 DIAGNOSIS — C3430 Malignant neoplasm of lower lobe, unspecified bronchus or lung: Secondary | ICD-10-CM | POA: Diagnosis present

## 2014-07-15 DIAGNOSIS — K219 Gastro-esophageal reflux disease without esophagitis: Secondary | ICD-10-CM | POA: Diagnosis present

## 2014-07-15 DIAGNOSIS — Z7901 Long term (current) use of anticoagulants: Secondary | ICD-10-CM | POA: Diagnosis not present

## 2014-07-15 DIAGNOSIS — Z8673 Personal history of transient ischemic attack (TIA), and cerebral infarction without residual deficits: Secondary | ICD-10-CM

## 2014-07-15 DIAGNOSIS — J4489 Other specified chronic obstructive pulmonary disease: Secondary | ICD-10-CM | POA: Diagnosis present

## 2014-07-15 DIAGNOSIS — J9819 Other pulmonary collapse: Secondary | ICD-10-CM | POA: Diagnosis not present

## 2014-07-15 DIAGNOSIS — E785 Hyperlipidemia, unspecified: Secondary | ICD-10-CM | POA: Diagnosis present

## 2014-07-15 DIAGNOSIS — Z87891 Personal history of nicotine dependence: Secondary | ICD-10-CM

## 2014-07-15 DIAGNOSIS — Z902 Acquired absence of lung [part of]: Secondary | ICD-10-CM

## 2014-07-15 DIAGNOSIS — Z6839 Body mass index (BMI) 39.0-39.9, adult: Secondary | ICD-10-CM

## 2014-07-15 DIAGNOSIS — I4891 Unspecified atrial fibrillation: Secondary | ICD-10-CM | POA: Diagnosis present

## 2014-07-15 DIAGNOSIS — I10 Essential (primary) hypertension: Secondary | ICD-10-CM | POA: Diagnosis present

## 2014-07-15 DIAGNOSIS — J449 Chronic obstructive pulmonary disease, unspecified: Secondary | ICD-10-CM | POA: Diagnosis present

## 2014-07-15 DIAGNOSIS — R918 Other nonspecific abnormal finding of lung field: Secondary | ICD-10-CM

## 2014-07-15 DIAGNOSIS — Z923 Personal history of irradiation: Secondary | ICD-10-CM

## 2014-07-15 DIAGNOSIS — Z85118 Personal history of other malignant neoplasm of bronchus and lung: Secondary | ICD-10-CM

## 2014-07-15 DIAGNOSIS — Z853 Personal history of malignant neoplasm of breast: Secondary | ICD-10-CM

## 2014-07-15 DIAGNOSIS — I739 Peripheral vascular disease, unspecified: Secondary | ICD-10-CM | POA: Diagnosis present

## 2014-07-15 DIAGNOSIS — D0220 Carcinoma in situ of unspecified bronchus and lung: Secondary | ICD-10-CM | POA: Diagnosis not present

## 2014-07-15 HISTORY — PX: VIDEO ASSISTED THORACOSCOPY (VATS)/WEDGE RESECTION: SHX6174

## 2014-07-15 HISTORY — PX: SEGMENTECOMY: SHX5076

## 2014-07-15 LAB — PROTIME-INR
INR: 1.22 (ref 0.00–1.49)
PROTHROMBIN TIME: 15.4 s — AB (ref 11.6–15.2)

## 2014-07-15 LAB — COMPREHENSIVE METABOLIC PANEL
ALBUMIN: 3.4 g/dL — AB (ref 3.5–5.2)
ALT: 13 U/L (ref 0–35)
ANION GAP: 14 (ref 5–15)
AST: 17 U/L (ref 0–37)
Alkaline Phosphatase: 43 U/L (ref 39–117)
BILIRUBIN TOTAL: 0.5 mg/dL (ref 0.3–1.2)
BUN: 17 mg/dL (ref 6–23)
CALCIUM: 8.7 mg/dL (ref 8.4–10.5)
CHLORIDE: 103 meq/L (ref 96–112)
CO2: 26 mEq/L (ref 19–32)
Creatinine, Ser: 0.61 mg/dL (ref 0.50–1.10)
GFR calc Af Amer: >90 mL/min (ref >90)
GFR calc non Af Amer: 88 mL/min — ABNORMAL LOW (ref >90)
Glucose, Bld: 135 mg/dL — ABNORMAL HIGH (ref 70–99)
Potassium: 4 mEq/L (ref 3.7–5.3)
Sodium: 143 mEq/L (ref 137–147)
Total Protein: 6.4 g/dL (ref 6.0–8.3)

## 2014-07-15 LAB — CBC
HCT: 39.8 % (ref 36.0–46.0)
Hemoglobin: 12.7 g/dL (ref 12.0–15.0)
MCH: 27.8 pg (ref 26.0–34.0)
MCHC: 31.9 g/dL (ref 30.0–36.0)
MCV: 87.1 fL (ref 78.0–100.0)
PLATELETS: 173 10*3/uL (ref 150–400)
RBC: 4.57 MIL/uL (ref 3.87–5.11)
RDW: 14.3 % (ref 11.5–15.5)
WBC: 9.2 10*3/uL (ref 4.0–10.5)

## 2014-07-15 LAB — APTT: APTT: 36 s (ref 24–37)

## 2014-07-15 SURGERY — VIDEO ASSISTED THORACOSCOPY (VATS)/WEDGE RESECTION
Anesthesia: General | Site: Chest | Laterality: Right

## 2014-07-15 MED ORDER — ROCURONIUM BROMIDE 100 MG/10ML IV SOLN
INTRAVENOUS | Status: DC | PRN
  Administered 2014-07-15: 50 mg via INTRAVENOUS

## 2014-07-15 MED ORDER — PROPOFOL 10 MG/ML IV BOLUS
INTRAVENOUS | Status: DC | PRN
  Administered 2014-07-15: 120 mg via INTRAVENOUS

## 2014-07-15 MED ORDER — DIPHENHYDRAMINE HCL 12.5 MG/5ML PO ELIX
12.5000 mg | ORAL_SOLUTION | Freq: Four times a day (QID) | ORAL | Status: DC | PRN
  Filled 2014-07-15: qty 5

## 2014-07-15 MED ORDER — MIDAZOLAM HCL 2 MG/2ML IJ SOLN
INTRAMUSCULAR | Status: AC
  Filled 2014-07-15: qty 2

## 2014-07-15 MED ORDER — LEVALBUTEROL HCL 0.63 MG/3ML IN NEBU
0.6300 mg | INHALATION_SOLUTION | Freq: Four times a day (QID) | RESPIRATORY_TRACT | Status: DC
  Administered 2014-07-15 – 2014-07-17 (×6): 0.63 mg via RESPIRATORY_TRACT
  Filled 2014-07-15 (×15): qty 3

## 2014-07-15 MED ORDER — ALBUTEROL SULFATE HFA 108 (90 BASE) MCG/ACT IN AERS: 2.0000 | INHALATION_SPRAY | Freq: Four times a day (QID) | RESPIRATORY_TRACT | Status: DC | PRN

## 2014-07-15 MED ORDER — HYDROMORPHONE HCL PF 1 MG/ML IJ SOLN
0.2500 mg | INTRAMUSCULAR | Status: DC | PRN
  Administered 2014-07-15 (×2): 0.5 mg via INTRAVENOUS

## 2014-07-15 MED ORDER — FENTANYL CITRATE 0.05 MG/ML IJ SOLN
INTRAMUSCULAR | Status: AC
  Filled 2014-07-15: qty 5

## 2014-07-15 MED ORDER — RADIAPLEXRX EX GEL
1.0000 "application " | Freq: Two times a day (BID) | CUTANEOUS | Status: DC
  Filled 2014-07-15: qty 170

## 2014-07-15 MED ORDER — MEPERIDINE HCL 25 MG/ML IJ SOLN
6.2500 mg | INTRAMUSCULAR | Status: DC | PRN
Start: 2014-07-15 — End: 2014-07-15

## 2014-07-15 MED ORDER — SODIUM CHLORIDE 0.9 % IJ SOLN: 9.0000 mL | INTRAMUSCULAR | Status: DC | PRN

## 2014-07-15 MED ORDER — FENTANYL CITRATE 0.05 MG/ML IJ SOLN
INTRAMUSCULAR | Status: DC | PRN
  Administered 2014-07-15: 200 ug via INTRAVENOUS
  Administered 2014-07-15: 100 ug via INTRAVENOUS
  Administered 2014-07-15 (×2): 50 ug via INTRAVENOUS

## 2014-07-15 MED ORDER — SIMVASTATIN 5 MG PO TABS
5.0000 mg | ORAL_TABLET | Freq: Every day | ORAL | Status: DC
  Administered 2014-07-16 – 2014-07-20 (×5): 5 mg via ORAL
  Filled 2014-07-15 (×5): qty 1

## 2014-07-15 MED ORDER — PROMETHAZINE HCL 25 MG/ML IJ SOLN
6.2500 mg | INTRAMUSCULAR | Status: DC | PRN
Start: 2014-07-15 — End: 2014-07-15

## 2014-07-15 MED ORDER — 0.9 % SODIUM CHLORIDE (POUR BTL) OPTIME
TOPICAL | Status: DC | PRN
  Administered 2014-07-15: 1000 mL

## 2014-07-15 MED ORDER — MIDAZOLAM HCL 2 MG/2ML IJ SOLN: 0.5000 mg | Freq: Once | INTRAMUSCULAR | Status: DC | PRN

## 2014-07-15 MED ORDER — PHENYLEPHRINE HCL 10 MG/ML IJ SOLN
10.0000 mg | INTRAVENOUS | Status: DC | PRN
  Administered 2014-07-15: 20 ug/min via INTRAVENOUS

## 2014-07-15 MED ORDER — PHENYLEPHRINE HCL 10 MG/ML IJ SOLN
INTRAMUSCULAR | Status: DC | PRN
  Administered 2014-07-15 (×2): 80 ug via INTRAVENOUS

## 2014-07-15 MED ORDER — HEMOSTATIC AGENTS (NO CHARGE) OPTIME
TOPICAL | Status: DC | PRN
  Administered 2014-07-15: 1 via TOPICAL

## 2014-07-15 MED ORDER — NALOXONE HCL 0.4 MG/ML IJ SOLN: 0.4000 mg | INTRAMUSCULAR | Status: DC | PRN

## 2014-07-15 MED ORDER — ONDANSETRON HCL 4 MG/2ML IJ SOLN
INTRAMUSCULAR | Status: DC | PRN
  Administered 2014-07-15: 4 mg via INTRAVENOUS

## 2014-07-15 MED ORDER — BUPIVACAINE 0.5 % ON-Q PUMP SINGLE CATH 400 ML
INJECTION | Status: DC | PRN
  Administered 2014-07-15: 400 mL

## 2014-07-15 MED ORDER — ACETAMINOPHEN 160 MG/5ML PO SOLN
1000.0000 mg | Freq: Four times a day (QID) | ORAL | Status: AC
  Filled 2014-07-15: qty 40

## 2014-07-15 MED ORDER — POTASSIUM CHLORIDE 10 MEQ/50ML IV SOLN
10.0000 meq | Freq: Every day | INTRAVENOUS | Status: DC | PRN
  Filled 2014-07-15 (×4): qty 50

## 2014-07-15 MED ORDER — DEXTROSE 5 % IV SOLN
1.5000 g | Freq: Two times a day (BID) | INTRAVENOUS | Status: AC
  Administered 2014-07-15 – 2014-07-16 (×2): 1.5 g via INTRAVENOUS
  Filled 2014-07-15 (×2): qty 1.5

## 2014-07-15 MED ORDER — OXYCODONE HCL 5 MG PO TABS: 5.0000 mg | ORAL_TABLET | Freq: Once | ORAL | Status: DC | PRN

## 2014-07-15 MED ORDER — OXYCODONE HCL 5 MG/5ML PO SOLN
5.0000 mg | Freq: Once | ORAL | Status: DC | PRN
Start: 2014-07-15 — End: 2014-07-15

## 2014-07-15 MED ORDER — FENTANYL 10 MCG/ML IV SOLN
INTRAVENOUS | Status: AC
  Administered 2014-07-15: 50 ug via INTRAVENOUS
  Administered 2014-07-15: 10 ug/h via INTRAVENOUS
  Administered 2014-07-15: 40 ug via INTRAVENOUS
  Administered 2014-07-16: 22:00:00 via INTRAVENOUS
  Administered 2014-07-16: 50 ug via INTRAVENOUS
  Administered 2014-07-16: 20 ug via INTRAVENOUS
  Administered 2014-07-16: 130 ug via INTRAVENOUS
  Administered 2014-07-16: 30 ug via INTRAVENOUS
  Administered 2014-07-16: 80 ug via INTRAVENOUS
  Administered 2014-07-16 – 2014-07-17 (×4): 50 ug via INTRAVENOUS
  Administered 2014-07-17: 60 ug via INTRAVENOUS
  Administered 2014-07-17: 40 ug via INTRAVENOUS
  Administered 2014-07-18: 30 ug via INTRAVENOUS
  Administered 2014-07-18: 10 ug via INTRAVENOUS
  Administered 2014-07-18: 30 ug via INTRAVENOUS
  Filled 2014-07-15 (×2): qty 50

## 2014-07-15 MED ORDER — DEXTROSE-NACL 5-0.9 % IV SOLN
INTRAVENOUS | Status: DC
  Administered 2014-07-15 – 2014-07-16 (×3): via INTRAVENOUS

## 2014-07-15 MED ORDER — VECURONIUM BROMIDE 10 MG IV SOLR
INTRAVENOUS | Status: AC
  Filled 2014-07-15: qty 10

## 2014-07-15 MED ORDER — ONDANSETRON HCL 4 MG/2ML IJ SOLN: 4.0000 mg | Freq: Four times a day (QID) | INTRAMUSCULAR | Status: DC | PRN

## 2014-07-15 MED ORDER — LIDOCAINE HCL (CARDIAC) 20 MG/ML IV SOLN
INTRAVENOUS | Status: AC
  Filled 2014-07-15: qty 5

## 2014-07-15 MED ORDER — SENNOSIDES-DOCUSATE SODIUM 8.6-50 MG PO TABS
1.0000 | ORAL_TABLET | Freq: Every day | ORAL | Status: DC
  Administered 2014-07-15 – 2014-07-19 (×5): 1 via ORAL
  Filled 2014-07-15 (×6): qty 1

## 2014-07-15 MED ORDER — GLYCOPYRROLATE 0.2 MG/ML IJ SOLN
INTRAMUSCULAR | Status: DC | PRN
  Administered 2014-07-15: 0.4 mg via INTRAVENOUS

## 2014-07-15 MED ORDER — VECURONIUM BROMIDE 10 MG IV SOLR
INTRAVENOUS | Status: DC | PRN
  Administered 2014-07-15: 2 mg via INTRAVENOUS
  Administered 2014-07-15 (×3): 1 mg via INTRAVENOUS
  Administered 2014-07-15 (×2): 2 mg via INTRAVENOUS

## 2014-07-15 MED ORDER — SIMETHICONE 80 MG PO CHEW
80.0000 mg | CHEWABLE_TABLET | Freq: Four times a day (QID) | ORAL | Status: DC | PRN
  Administered 2014-07-18: 80 mg via ORAL
  Filled 2014-07-15: qty 1

## 2014-07-15 MED ORDER — NEOSTIGMINE METHYLSULFATE 10 MG/10ML IV SOLN
INTRAVENOUS | Status: DC | PRN
  Administered 2014-07-15: 3 mg via INTRAVENOUS

## 2014-07-15 MED ORDER — ALBUTEROL SULFATE HFA 108 (90 BASE) MCG/ACT IN AERS
INHALATION_SPRAY | RESPIRATORY_TRACT | Status: DC | PRN
  Administered 2014-07-15: 4 via RESPIRATORY_TRACT

## 2014-07-15 MED ORDER — CHLORHEXIDINE GLUCONATE CLOTH 2 % EX PADS
6.0000 | MEDICATED_PAD | Freq: Every day | CUTANEOUS | Status: AC
  Administered 2014-07-16 – 2014-07-19 (×4): 6 via TOPICAL

## 2014-07-15 MED ORDER — CALCIUM CARBONATE ANTACID 500 MG PO CHEW
1.0000 | CHEWABLE_TABLET | Freq: Every day | ORAL | Status: DC | PRN
  Filled 2014-07-15 (×2): qty 1

## 2014-07-15 MED ORDER — ALBUTEROL SULFATE HFA 108 (90 BASE) MCG/ACT IN AERS
INHALATION_SPRAY | RESPIRATORY_TRACT | Status: AC
  Filled 2014-07-15: qty 6.7

## 2014-07-15 MED ORDER — HYDROMORPHONE HCL PF 1 MG/ML IJ SOLN
INTRAMUSCULAR | Status: AC
Start: 2014-07-15 — End: 2014-07-15
  Filled 2014-07-15: qty 1

## 2014-07-15 MED ORDER — ONDANSETRON HCL 4 MG/2ML IJ SOLN
INTRAMUSCULAR | Status: AC
  Filled 2014-07-15: qty 2

## 2014-07-15 MED ORDER — BUPIVACAINE 0.5 % ON-Q PUMP SINGLE CATH 400 ML
400.0000 mL | INJECTION | Status: DC
  Filled 2014-07-15: qty 400

## 2014-07-15 MED ORDER — ONDANSETRON HCL 4 MG/2ML IJ SOLN
4.0000 mg | Freq: Four times a day (QID) | INTRAMUSCULAR | Status: DC | PRN
  Administered 2014-07-19: 4 mg via INTRAVENOUS
  Filled 2014-07-15: qty 2

## 2014-07-15 MED ORDER — DIPHENHYDRAMINE HCL 50 MG/ML IJ SOLN: 12.5000 mg | Freq: Four times a day (QID) | INTRAMUSCULAR | Status: DC | PRN

## 2014-07-15 MED ORDER — LACTATED RINGERS IV SOLN
INTRAVENOUS | Status: DC | PRN
  Administered 2014-07-15 (×2): via INTRAVENOUS

## 2014-07-15 MED ORDER — ACETAMINOPHEN 500 MG PO TABS
1000.0000 mg | ORAL_TABLET | Freq: Four times a day (QID) | ORAL | Status: AC
  Administered 2014-07-15 – 2014-07-20 (×17): 1000 mg via ORAL
  Filled 2014-07-15 (×18): qty 2

## 2014-07-15 MED ORDER — SODIUM CHLORIDE 0.9 % IJ SOLN
INTRAMUSCULAR | Status: AC
  Filled 2014-07-15: qty 10

## 2014-07-15 MED ORDER — OXYCODONE HCL 5 MG PO TABS
5.0000 mg | ORAL_TABLET | ORAL | Status: DC | PRN
Start: 2014-07-15 — End: 2014-07-20
  Administered 2014-07-16: 5 mg via ORAL
  Administered 2014-07-16 – 2014-07-18 (×4): 10 mg via ORAL
  Filled 2014-07-15 (×4): qty 2
  Filled 2014-07-15: qty 1
  Filled 2014-07-15: qty 2

## 2014-07-15 MED ORDER — BUPIVACAINE HCL (PF) 0.5 % IJ SOLN
INTRAMUSCULAR | Status: DC | PRN
  Administered 2014-07-15: 30 mL

## 2014-07-15 MED ORDER — METOPROLOL TARTRATE 25 MG PO TABS
25.0000 mg | ORAL_TABLET | Freq: Two times a day (BID) | ORAL | Status: DC
  Administered 2014-07-16 – 2014-07-17 (×4): 25 mg via ORAL
  Filled 2014-07-15 (×7): qty 1

## 2014-07-15 MED ORDER — BISACODYL 5 MG PO TBEC
10.0000 mg | DELAYED_RELEASE_TABLET | Freq: Every day | ORAL | Status: DC
  Administered 2014-07-16 – 2014-07-20 (×4): 10 mg via ORAL
  Filled 2014-07-15 (×4): qty 2

## 2014-07-15 MED ORDER — PROPOFOL 10 MG/ML IV BOLUS
INTRAVENOUS | Status: AC
  Filled 2014-07-15: qty 20

## 2014-07-15 MED ORDER — ALBUTEROL SULFATE (2.5 MG/3ML) 0.083% IN NEBU: 2.5000 mg | INHALATION_SOLUTION | Freq: Four times a day (QID) | RESPIRATORY_TRACT | Status: DC | PRN

## 2014-07-15 MED ORDER — MUPIROCIN 2 % EX OINT
1.0000 "application " | TOPICAL_OINTMENT | Freq: Two times a day (BID) | CUTANEOUS | Status: DC
  Administered 2014-07-15 – 2014-07-19 (×9): 1 via NASAL
  Filled 2014-07-15 (×2): qty 22

## 2014-07-15 SURGICAL SUPPLY — 99 items
APPLIER CLIP 5 13 M/L LIGAMAX5 (MISCELLANEOUS) ×4
BENZOIN TINCTURE PRP APPL 2/3 (GAUZE/BANDAGES/DRESSINGS) ×4 IMPLANT
BLADE SURG 11 STRL SS (BLADE) ×4 IMPLANT
CANISTER SUCTION 2500CC (MISCELLANEOUS) ×8 IMPLANT
CATH KIT ON Q 5IN SLV (PAIN MANAGEMENT) ×4 IMPLANT
CATH THORACIC 28FR (CATHETERS) IMPLANT
CATH THORACIC 36FR (CATHETERS) IMPLANT
CATH THORACIC 36FR RT ANG (CATHETERS) IMPLANT
CLIP APPLIE 5 13 M/L LIGAMAX5 (MISCELLANEOUS) ×2 IMPLANT
CLIP TI MEDIUM 6 (CLIP) ×4 IMPLANT
CONN ST 1/4X3/8  BEN (MISCELLANEOUS) ×2
CONN ST 1/4X3/8 BEN (MISCELLANEOUS) ×2 IMPLANT
CONN Y 3/8X3/8X3/8  BEN (MISCELLANEOUS) ×2
CONN Y 3/8X3/8X3/8 BEN (MISCELLANEOUS) ×2 IMPLANT
CONT SPEC 4OZ CLIKSEAL STRL BL (MISCELLANEOUS) ×36 IMPLANT
COVER SURGICAL LIGHT HANDLE (MISCELLANEOUS) ×4 IMPLANT
DERMABOND ADVANCED (GAUZE/BANDAGES/DRESSINGS) ×2
DERMABOND ADVANCED .7 DNX12 (GAUZE/BANDAGES/DRESSINGS) ×2 IMPLANT
DRAIN CHANNEL 28F RND 3/8 FF (WOUND CARE) IMPLANT
DRAIN CHANNEL 32F RND 10.7 FF (WOUND CARE) ×4 IMPLANT
DRAPE LAPAROSCOPIC ABDOMINAL (DRAPES) ×4 IMPLANT
DRAPE WARM FLUID 44X44 (DRAPE) ×4 IMPLANT
DRILL BIT 6MM (BIT) ×4 IMPLANT
ELECT REM PT RETURN 9FT ADLT (ELECTROSURGICAL) ×4
ELECTRODE REM PT RTRN 9FT ADLT (ELECTROSURGICAL) ×2 IMPLANT
GAUZE SPONGE 4X4 16PLY XRAY LF (GAUZE/BANDAGES/DRESSINGS) ×4 IMPLANT
GLOVE BIO SURGEON STRL SZ7 (GLOVE) ×8 IMPLANT
GLOVE BIOGEL PI IND STRL 6 (GLOVE) ×4 IMPLANT
GLOVE BIOGEL PI IND STRL 6.5 (GLOVE) ×2 IMPLANT
GLOVE BIOGEL PI IND STRL 7.0 (GLOVE) ×4 IMPLANT
GLOVE BIOGEL PI INDICATOR 6 (GLOVE) ×4
GLOVE BIOGEL PI INDICATOR 6.5 (GLOVE) ×2
GLOVE BIOGEL PI INDICATOR 7.0 (GLOVE) ×4
GLOVE SURG SIGNA 7.5 PF LTX (GLOVE) ×8 IMPLANT
GOWN STRL REUS W/ TWL LRG LVL3 (GOWN DISPOSABLE) ×6 IMPLANT
GOWN STRL REUS W/ TWL XL LVL3 (GOWN DISPOSABLE) ×4 IMPLANT
GOWN STRL REUS W/TWL LRG LVL3 (GOWN DISPOSABLE) ×6
GOWN STRL REUS W/TWL XL LVL3 (GOWN DISPOSABLE) ×4
HANDLE STAPLE ENDO GIA SHORT (STAPLE) ×2
HEMOSTAT SURGICEL 2X14 (HEMOSTASIS) ×4 IMPLANT
KIT BASIN OR (CUSTOM PROCEDURE TRAY) ×4 IMPLANT
KIT ROOM TURNOVER OR (KITS) ×4 IMPLANT
KIT SUCTION CATH 14FR (SUCTIONS) ×4 IMPLANT
NS IRRIG 1000ML POUR BTL (IV SOLUTION) ×12 IMPLANT
PACK CHEST (CUSTOM PROCEDURE TRAY) ×4 IMPLANT
PAD ARMBOARD 7.5X6 YLW CONV (MISCELLANEOUS) ×16 IMPLANT
PAIN PUMP ON-Q 400MLX5ML 5IN (MISCELLANEOUS) ×4 IMPLANT
POUCH ENDO CATCH II 15MM (MISCELLANEOUS) IMPLANT
POUCH SPECIMEN RETRIEVAL 10MM (ENDOMECHANICALS) ×4 IMPLANT
RELOAD EGIA 45 MED/THCK PURPLE (STAPLE) ×16 IMPLANT
RELOAD EGIA 45 TAN VASC (STAPLE) ×4 IMPLANT
RELOAD EGIA 60 MED/THCK PURPLE (STAPLE) ×4 IMPLANT
RELOAD EGIA TRIS TAN 45 CVD (STAPLE) ×8 IMPLANT
RELOAD TRI 2.0 60 XTHK VAS SUL (STAPLE) ×24 IMPLANT
SEALANT PROGEL (MISCELLANEOUS) ×4 IMPLANT
SEALANT SURG COSEAL 4ML (VASCULAR PRODUCTS) IMPLANT
SEALANT SURG COSEAL 8ML (VASCULAR PRODUCTS) IMPLANT
SOLUTION ANTI FOG 6CC (MISCELLANEOUS) ×4 IMPLANT
SPECIMEN JAR MEDIUM (MISCELLANEOUS) ×4 IMPLANT
SPONGE GAUZE 4X4 12PLY (GAUZE/BANDAGES/DRESSINGS) ×4 IMPLANT
SPONGE INTESTINAL PEANUT (DISPOSABLE) ×16 IMPLANT
SPONGE LAP 18X18 X RAY DECT (DISPOSABLE) ×4 IMPLANT
STAPLER ENDO GIA 12MM SHORT (STAPLE) ×2 IMPLANT
SUT PROLENE 4 0 RB 1 (SUTURE) ×2
SUT PROLENE 4-0 RB1 .5 CRCL 36 (SUTURE) ×2 IMPLANT
SUT PROLENE 6 0 C 1 30 (SUTURE) ×4 IMPLANT
SUT SILK  1 MH (SUTURE) ×8
SUT SILK 1 MH (SUTURE) ×8 IMPLANT
SUT SILK 2 0 SH (SUTURE) IMPLANT
SUT SILK 2 0SH CR/8 30 (SUTURE) IMPLANT
SUT SILK 3 0 SH 30 (SUTURE) IMPLANT
SUT SILK 3 0SH CR/8 30 (SUTURE) ×4 IMPLANT
SUT VIC AB 0 CTX 27 (SUTURE) IMPLANT
SUT VIC AB 1 CTX 27 (SUTURE) ×4 IMPLANT
SUT VIC AB 2-0 CT1 27 (SUTURE)
SUT VIC AB 2-0 CT1 TAPERPNT 27 (SUTURE) IMPLANT
SUT VIC AB 2-0 CTX 36 (SUTURE) ×8 IMPLANT
SUT VIC AB 3-0 MH 27 (SUTURE) IMPLANT
SUT VIC AB 3-0 SH 27 (SUTURE) ×4
SUT VIC AB 3-0 SH 27X BRD (SUTURE) ×4 IMPLANT
SUT VIC AB 3-0 X1 27 (SUTURE) ×4 IMPLANT
SUT VICRYL 0 UR6 27IN ABS (SUTURE) ×8 IMPLANT
SUT VICRYL 2 TP 1 (SUTURE) ×4 IMPLANT
SWAB COLLECTION DEVICE MRSA (MISCELLANEOUS) IMPLANT
SYSTEM SAHARA CHEST DRAIN ATS (WOUND CARE) ×4 IMPLANT
TAPE CLOTH SURG 4X10 WHT LF (GAUZE/BANDAGES/DRESSINGS) ×4 IMPLANT
TAPE UMBILICAL COTTON 1/8X30 (MISCELLANEOUS) ×4 IMPLANT
TIP APPLICATOR SPRAY EXTEND 16 (VASCULAR PRODUCTS) ×4 IMPLANT
TOWEL OR 17X24 6PK STRL BLUE (TOWEL DISPOSABLE) ×4 IMPLANT
TOWEL OR 17X26 10 PK STRL BLUE (TOWEL DISPOSABLE) ×8 IMPLANT
TRAP SPECIMEN MUCOUS 40CC (MISCELLANEOUS) IMPLANT
TRAY FOLEY CATH 16FRSI W/METER (SET/KITS/TRAYS/PACK) ×4 IMPLANT
TROCAR BLADELESS 5MM (ENDOMECHANICALS) ×4 IMPLANT
TROCAR XCEL BLADELESS 5X75MML (TROCAR) ×8 IMPLANT
TROCAR XCEL NON-BLD 5MMX100MML (ENDOMECHANICALS) IMPLANT
TUBE ANAEROBIC SPECIMEN COL (MISCELLANEOUS) IMPLANT
TUNNELER SHEATH ON-Q 11GX8 (MISCELLANEOUS) ×4 IMPLANT
TUNNELER SHEATH ON-Q 11GX8 DSP (PAIN MANAGEMENT) IMPLANT
WATER STERILE IRR 1000ML POUR (IV SOLUTION) ×8 IMPLANT

## 2014-07-15 NOTE — Interval H&P Note (Signed)
History and Physical Interval Note:  07/15/2014 7:16 AM  Tammie [name]  has presented today for surgery, with the diagnosis of RUL & RLL NODULES  The various methods of treatment have been discussed with the patient and family. After consideration of risks, benefits and other options for treatment, the patient has consented to  Procedure(s): VIDEO ASSISTED THORACOSCOPY (VATS)/RLL WEDGE RESECTION (Right) RUL SEGMENTECTOMY (Right) POSSIBLE LOBECTOMY (Right) as a surgical intervention .  The patient's history has been reviewed, patient examined, no change in status, stable for surgery.  I have reviewed the patient's chart and labs.  Questions were answered to the patient's satisfaction.     Devaris Quirk C
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
## 2014-07-15 NOTE — Progress Notes (Signed)
Utilization Review Completed.Tammie [name] T7/20/2015  

## 2014-07-15 NOTE — Anesthesia Preprocedure Evaluation (Addendum)
Anesthesia Evaluation  Patient identified by MRN, date of birth, ID band Patient awake    Reviewed: Allergy & Precautions, H&P , NPO status , Patient's Chart, lab work & pertinent test results  History of Anesthesia Complications Negative for: history of anesthetic complications  Airway Mallampati: II TM Distance: >3 FB Neck ROM: Full    Dental  (+) Edentulous Upper, Dental Advisory Given   Pulmonary shortness of breath and with exertion, asthma , COPD COPD inhaler, former smoker,  '12 Lung ca  breath sounds clear to auscultation        Cardiovascular hypertension, Pt. on medications and Pt. on home beta blockers + Peripheral Vascular Disease + dysrhythmias Atrial Fibrillation Rhythm:Irregular Rate:Normal  '13 ECHO: EF 55-60%, mod MR   Neuro/Psych CVA (memory issues)    GI/Hepatic Neg liver ROS, GERD-  Medicated and Controlled,  Endo/Other  Morbid obesity  Renal/GU negative Renal ROS     Musculoskeletal   Abdominal (+) + obese,   Peds  Hematology  (+) Blood dyscrasia (coumadin: INR 1.22), ,   Anesthesia Other Findings Breast ca: surgery and XRT  Reproductive/Obstetrics                         Anesthesia Physical Anesthesia Plan  ASA: III  Anesthesia Plan: General   Post-op Pain Management:    Induction: Intravenous  Airway Management Planned: Double Lumen EBT  Additional Equipment: CVP and Arterial line  Intra-op Plan:   Post-operative Plan: Extubation in OR and Possible Post-op intubation/ventilation  Informed Consent: I have reviewed the patients History and Physical, chart, labs and discussed the procedure including the risks, benefits and alternatives for the proposed anesthesia with the patient or authorized representative who has indicated his/her understanding and acceptance.   Dental advisory given  Plan Discussed with: Anesthesiologist, Surgeon and CRNA  Anesthesia Plan  Comments: (Plan routine monitors, A line, CVP, GETA with DLT and possible post op ventilation)       Anesthesia Quick Evaluation

## 2014-07-15 NOTE — Transfer of Care (Signed)
Immediate Anesthesia Transfer of Care Note  Patient: LADANA CHAVERO  Procedure(s) Performed: Procedure(s): VIDEO ASSISTED THORACOSCOPY (VATS)/RLL WEDGE RESECTION, Lymph Node Sampling with placement of On Q Pump. (Right) RUL SEGMENTECTOMY (Right)  Patient Location: PACU  Anesthesia Type:General  Level of Consciousness: awake, alert , oriented and patient cooperative  Airway & Oxygen Therapy: Patient Spontanous Breathing and Patient connected to face mask oxygen  Post-op Assessment: Report given to PACU RN and Post -op Vital signs reviewed and stable  Post vital signs: Reviewed and stable  Complications: No apparent anesthesia complications

## 2014-07-15 NOTE — Progress Notes (Signed)
Patient ID: Tammie [name], female   DOB: [birth year], 73 y.o.   MRN: [id number] EVENING ROUNDS NOTE :     [address]       [place] [address]             [phone]                 Day of Surgery Procedure(s) (LRB): VIDEO ASSISTED THORACOSCOPY (VATS)/RLL WEDGE RESECTION, Lymph Node Sampling with placement of On Q Pump. (Right) RUL SEGMENTECTOMY (Right)  Total Length of Stay:  LOS: 0 days  BP 121/68  Pulse 79  Temp(Src) 97.8 F (36.6 C) (Oral)  Resp 14  Ht 5\' 11"  (1.803 m)  Wt 281 lb 1.4 oz (127.5 kg)  BMI 39.22 kg/m2  SpO2 98%  .Intake/Output     07/19 0701 - 07/20 0700 07/20 0701 - 07/21 0700   P.O.  360   I.V. (mL/kg)  1900 (14.9)   Total Intake(mL/kg)  2260 (17.7)   Urine (mL/kg/hr)  590 (0.4)   Blood  750 (0.5)   Chest Tube  260 (0.2)   Total Output   1600   Net   +660          . bupivacaine 0.5 % ON-Q pump SINGLE CATH 400 mL    . dextrose 5 % and 0.9% NaCl 100 mL/hr at 07/15/14 1321     Lab Results  Component Value Date   WBC 9.2 07/15/2014   HGB 12.7 07/15/2014   HCT 39.8 07/15/2014   PLT 173 07/15/2014   GLUCOSE 135* 07/15/2014   CHOL 145 12/08/2012   TRIG 247* 12/08/2012   HDL 55 12/08/2012   LDLCALC 41 12/08/2012   ALT 13 07/15/2014   AST 17 07/15/2014   NA 143 07/15/2014   K 4.0 07/15/2014   CL 103 07/15/2014   CREATININE 0.61 07/15/2014   BUN 17 07/15/2014   CO2 26 07/15/2014   TSH 1.223 03/09/2012   INR 1.22 07/15/2014   Stable post op Not bleeding bp elevated up to 170's Was on lopressor 100mg  po bid preop- will resume lopressor at lower dose and increase as needed  Grace Isaac MD  Beeper [phone] Office [phone] 07/15/2014 6:04 PM

## 2014-07-15 NOTE — Anesthesia Procedure Notes (Signed)
Procedure Name: Intubation Date/Time: 07/15/2014 7:47 AM Performed by: Manuela Schwartz B Pre-anesthesia Checklist: Patient identified, Emergency Drugs available, Suction available, Patient being monitored and Timeout performed Patient Re-evaluated:Patient Re-evaluated prior to inductionOxygen Delivery Method: Circle system utilized Preoxygenation: Pre-oxygenation with 100% oxygen Intubation Type: IV induction Ventilation: Mask ventilation without difficulty Laryngoscope Size: Mac and 3 Grade View: Grade I Endobronchial tube: Left, Double lumen EBT, EBT position confirmed by auscultation and EBT position confirmed by fiberoptic bronchoscope and 37 Fr Number of attempts: 1 Airway Equipment and Method: Stylet Placement Confirmation: ETT inserted through vocal cords under direct vision,  positive ETCO2 and breath sounds checked- equal and bilateral Secured at: 30 cm Tube secured with: Tape Dental Injury: Teeth and Oropharynx as per pre-operative assessment

## 2014-07-15 NOTE — Anesthesia Postprocedure Evaluation (Signed)
  Anesthesia Post-op Note  Patient: Tammie [name]  Procedure(s) Performed: Procedure(s): VIDEO ASSISTED THORACOSCOPY (VATS)/RLL WEDGE RESECTION, Lymph Node Sampling with placement of On Q Pump. (Right) RUL SEGMENTECTOMY (Right)  Patient Location: PACU  Anesthesia Type:General  Level of Consciousness: awake, alert , oriented and patient cooperative  Airway and Oxygen Therapy: Patient Spontanous Breathing and Patient connected to nasal cannula oxygen  Post-op Pain: mild  Post-op Assessment: Post-op Vital signs reviewed, Patient's Cardiovascular Status Stable, Respiratory Function Stable, Patent Airway, No signs of Nausea or vomiting and Pain level controlled  Post-op Vital Signs: Reviewed and stable  Last Vitals:  Filed Vitals:   07/15/14 1400  BP: 135/85  Pulse: 73  Temp:   Resp: 17    Complications: No apparent anesthesia complications

## 2014-07-15 NOTE — Brief Op Note (Addendum)
07/15/2014  11:01 AM  PATIENT:  Tammie [name]  73 y.o. female  PRE-OPERATIVE DIAGNOSIS:  RUL & RLL NODULES  POST-OPERATIVE DIAGNOSIS:  RUL & RLL NODULES  PROCEDURE:  Procedure(s):  VIDEO ASSISTED THORACOSCOPY (VATS) -Wedge Resection Right Lower Lobe -Posterior Segmentectomy Right Upper Lobe -Lymph Node Sampling -Placement of On-Q Local anesthetic catheter  SURGEON:  Surgeon(s) and Role:    * Melrose Nakayama, MD - Primary  PHYSICIAN ASSISTANT: Ellwood Handler PA-C  ANESTHESIA:   general  EBL:  Total I/O In: 1000 [I.V.:1000] Out: 900 [Urine:150; Blood:750]  BLOOD ADMINISTERED:none  DRAINS: 28 Blake, 28 Straight chest tube right chest   LOCAL MEDICATIONS USED:  MARCAINE     SPECIMEN:  Source of Specimen:  Wedge resection RLL, Posterior Segment Right Upper Lobe, Lymph Nodes  DISPOSITION OF SPECIMEN:  PATHOLOGY  COUNTS:  YES  PLAN OF CARE: Admit to inpatient   PATIENT DISPOSITION:  ICU - extubated and stable.   Delay start of Pharmacological VTE agent (>24hrs) due to surgical blood loss or risk of bleeding: yes

## 2014-07-15 NOTE — Progress Notes (Signed)
Notified Dr. Roxan Hockey of patient stating she was oozing from lovenox injections on abdomen. Pt Ptt results called to Dr. Roxan Hockey. Pt  15.4

## 2014-07-16 ENCOUNTER — Encounter (HOSPITAL_COMMUNITY): Payer: Self-pay | Admitting: Thoracic Surgery (Cardiothoracic Vascular Surgery)

## 2014-07-16 ENCOUNTER — Inpatient Hospital Stay (HOSPITAL_COMMUNITY): Payer: Medicare HMO

## 2014-07-16 LAB — CBC
HCT: 36.3 % (ref 36.0–46.0)
Hemoglobin: 11.2 g/dL — ABNORMAL LOW (ref 12.0–15.0)
MCH: 27.3 pg (ref 26.0–34.0)
MCHC: 30.9 g/dL (ref 30.0–36.0)
MCV: 88.3 fL (ref 78.0–100.0)
PLATELETS: 138 10*3/uL — AB (ref 150–400)
RBC: 4.11 MIL/uL (ref 3.87–5.11)
RDW: 14.6 % (ref 11.5–15.5)
WBC: 9.7 10*3/uL (ref 4.0–10.5)

## 2014-07-16 LAB — POCT I-STAT 3, ART BLOOD GAS (G3+)
Acid-Base Excess: 3 mmol/L — ABNORMAL HIGH (ref 0.0–2.0)
BICARBONATE: 29.6 meq/L — AB (ref 20.0–24.0)
O2 Saturation: 93 %
PH ART: 7.368 (ref 7.350–7.450)
TCO2: 31 mmol/L (ref 0–100)
pCO2 arterial: 51.4 mmHg — ABNORMAL HIGH (ref 35.0–45.0)
pO2, Arterial: 70 mmHg — ABNORMAL LOW (ref 80.0–100.0)

## 2014-07-16 LAB — BASIC METABOLIC PANEL
Anion gap: 13 (ref 5–15)
BUN: 15 mg/dL (ref 6–23)
CHLORIDE: 100 meq/L (ref 96–112)
CO2: 27 meq/L (ref 19–32)
Calcium: 8.1 mg/dL — ABNORMAL LOW (ref 8.4–10.5)
Creatinine, Ser: 0.53 mg/dL (ref 0.50–1.10)
GFR calc non Af Amer: >90 mL/min (ref >90)
Glucose, Bld: 146 mg/dL — ABNORMAL HIGH (ref 70–99)
Potassium: 3.7 mEq/L (ref 3.7–5.3)
Sodium: 140 mEq/L (ref 137–147)

## 2014-07-16 MED ORDER — ZOLPIDEM TARTRATE 5 MG PO TABS
5.0000 mg | ORAL_TABLET | Freq: Every evening | ORAL | Status: DC | PRN
Start: 2014-07-16 — End: 2014-07-20
  Filled 2014-07-16: qty 1

## 2014-07-16 MED ORDER — DIPHENHYDRAMINE HCL 25 MG PO TABS: 25.0000 mg | ORAL_TABLET | Freq: Every evening | ORAL | Status: DC | PRN

## 2014-07-16 MED ORDER — WARFARIN SODIUM 2.5 MG PO TABS
2.5000 mg | ORAL_TABLET | Freq: Every day | ORAL | Status: DC
  Administered 2014-07-16 – 2014-07-18 (×3): 2.5 mg via ORAL
  Filled 2014-07-16 (×4): qty 1

## 2014-07-16 MED ORDER — LUNG SURGERY BOOK
Freq: Once | Status: AC
  Administered 2014-07-17: 05:00:00
  Filled 2014-07-16: qty 1

## 2014-07-16 MED ORDER — GUAIFENESIN ER 600 MG PO TB12
1200.0000 mg | ORAL_TABLET | Freq: Two times a day (BID) | ORAL | Status: DC
Start: 2014-07-16 — End: 2014-07-20
  Administered 2014-07-16 – 2014-07-20 (×9): 1200 mg via ORAL
  Filled 2014-07-16 (×10): qty 2

## 2014-07-16 MED ORDER — POTASSIUM CHLORIDE 10 MEQ/50ML IV SOLN
10.0000 meq | INTRAVENOUS | Status: AC
  Administered 2014-07-16 (×4): 10 meq via INTRAVENOUS

## 2014-07-16 MED ORDER — ENOXAPARIN SODIUM 40 MG/0.4ML ~~LOC~~ SOLN
40.0000 mg | SUBCUTANEOUS | Status: DC
  Administered 2014-07-16 – 2014-07-20 (×5): 40 mg via SUBCUTANEOUS
  Filled 2014-07-16 (×5): qty 0.4

## 2014-07-16 MED ORDER — SODIUM CHLORIDE 0.9 % IJ SOLN
10.0000 mL | Freq: Two times a day (BID) | INTRAMUSCULAR | Status: DC
  Administered 2014-07-16 – 2014-07-18 (×6): 10 mL
  Administered 2014-07-19: 20 mL

## 2014-07-16 MED ORDER — WARFARIN - PHYSICIAN DOSING INPATIENT
Freq: Every day | Status: DC
  Administered 2014-07-17: 18:00:00

## 2014-07-16 MED ORDER — HYDROCHLOROTHIAZIDE 12.5 MG PO CAPS
12.5000 mg | ORAL_CAPSULE | Freq: Every day | ORAL | Status: DC
  Administered 2014-07-17 – 2014-07-20 (×4): 12.5 mg via ORAL
  Filled 2014-07-16 (×4): qty 1

## 2014-07-16 MED ORDER — SODIUM CHLORIDE 0.9 % IJ SOLN: 10.0000 mL | INTRAMUSCULAR | Status: DC | PRN

## 2014-07-16 NOTE — Progress Notes (Signed)
[facility] BRIEF SICU PROGRESS NOTE  1 Day Post-Op  S/P Procedure(s) (LRB): VIDEO ASSISTED THORACOSCOPY (VATS)/RLL WEDGE RESECTION, Lymph Node Sampling with placement of On Q Pump. (Right) RUL SEGMENTECTOMY (Right)   Stable day Afib w/ stable HR, BP O2 sats 95% on 2 L/min UOP adquate  Plan: Continue current plan  [name] H 07/16/2014 7:45 PM

## 2014-07-16 NOTE — Progress Notes (Signed)
1 Day Post-Op Procedure(s) (LRB): VIDEO ASSISTED THORACOSCOPY (VATS)/RLL WEDGE RESECTION, Lymph Node Sampling with placement of On Q Pump. (Right) RUL SEGMENTECTOMY (Right) Subjective: Some pain from chest tubes Denies nausea  Objective: Vital signs in last 24 hours: Temp:  [97.4 F (36.3 C)-98.4 F (36.9 C)] 98.1 F (36.7 C) (07/21 0744) Pulse Rate:  [59-93] 82 (07/21 0700) Cardiac Rhythm:  [-] Atrial fibrillation (07/21 0700) Resp:  [12-29] 21 (07/21 0700) BP: (107-156)/(54-85) 117/61 mmHg (07/21 0700) SpO2:  [81 %-100 %] 94 % (07/21 0700) Arterial Line BP: (122-194)/(49-89) 122/50 mmHg (07/21 0600) Weight:  [281 lb 4.9 oz (127.6 kg)] 281 lb 4.9 oz (127.6 kg) (07/21 0700)  Hemodynamic parameters for last 24 hours:    Intake/Output from previous day: 07/20 0701 - 07/21 0700 In: 3560 [P.O.:360; I.V.:3200] Out: 2815 [Urine:1240; Blood:750; Chest Tube:825] Intake/Output this shift:    General appearance: alert and no distress Neurologic: intact Heart: irregularly irregular rhythm Lungs: diminished breath sounds bibasilar Abdomen: normal findings: soft, non-tender no air leak, serosanguinous drainage from CT  Lab Results:  Recent Labs  07/15/14 1125 07/16/14 0410  WBC 9.2 9.7  HGB 12.7 11.2*  HCT 39.8 36.3  PLT 173 138*   BMET:  Recent Labs  07/15/14 1125 07/16/14 0410  NA 143 140  K 4.0 3.7  CL 103 100  CO2 26 27  GLUCOSE 135* 146*  BUN 17 15  CREATININE 0.61 0.53  CALCIUM 8.7 8.1*    PT/INR:  Recent Labs  07/15/14 0630  LABPROT 15.4*  INR 1.22   ABG    Component Value Date/Time   PHART 7.368 07/16/2014 0418   HCO3 29.6* 07/16/2014 0418   TCO2 31 07/16/2014 0418   O2SAT 93.0 07/16/2014 0418   CBG (last 3)  No results found for this basename: GLUCAP,  in the last 72 hours  Assessment/Plan: S/P Procedure(s) (LRB): VIDEO ASSISTED THORACOSCOPY (VATS)/RLL WEDGE RESECTION, Lymph Node Sampling with placement of On Q Pump. (Right) RUL  SEGMENTECTOMY (Right) - CV- chronic a fib- continue lopressor, restart coumadin  Will cover with low dose enoxaparin until INR therapeutic  RESP- CXR shows some atelectasis in remaining segments of RUL- flutter, IS, nebs PRN  RENAL- creatinine OK, supplement K  ENDO- CBG mildly elevated, nit requiring intervention at this point  GI/Nutrition- advance diet as tolerated  OOB, ambulate  LOS: 1 day    Jaecob Lowden C 07/16/2014

## 2014-07-16 NOTE — Op Note (Signed)
Tammie [name], Tammie [name] NO.:  [id number]  MEDICAL RECORD NO.:  [id number]  LOCATION:  2S12C                        FACILITY:  [facility]  PHYSICIAN:  Revonda Standard. Roxan Hockey, M.D.DATE OF BIRTH:  [birth year]  DATE OF PROCEDURE:  07/15/2014 DATE OF DISCHARGE:                              OPERATIVE REPORT   PREOPERATIVE DIAGNOSES:  Right upper and lower lobe nodules.  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  PROCEDURE:   Right video-assisted thoracoscopy Wedge resection of superior segment right lower lobe Posterior segmentectomy right upper lobe with lymph node dissection On-Q local anesthetic catheter placement.  SURGEON:  Revonda Standard. Roxan Hockey, M.D.  ASSISTANT:  Ellwood Handler, PA-C.  ANESTHESIA:  General.  FINDINGS:  Diffusely nodular lung, slow to deflate, difficult to work on, veins on surface of the lung tore and bled easily.  Frozen section of right lower lobe revealed a small focus of well-differentiated adenocarcinoma in the setting of adenocarcinoma in situ.  Frozen section of right upper lobe posterior segment showed adenocarcinoma- margins clear of tumor.  CLINICAL NOTE:  Tammie [name] is a 73 year old woman with a history of lung cancer and a remote history of heavy tobacco abuse.  She previously had a wedge resection of the left lung for a stage IA cancer back in 2012. She now has a right upper lobe nodule that has increased in size, become more solid and is hypermetabolic on PET CT.  There also was a 16-mm ground-glass opacity in the superior segment of the right lower lobe, which was not hypermetabolic but she was advised that we would plan to do a wedge resection of that nodule at the time of her right upper lobe resection.  The indications, risks, benefits, and alternatives were discussed in detail with the patient.  She understood and accepted the risks and agreed to  proceed.  OPERATIVE NOTE:  Tammie [name] was brought to the preoperative holding area on July 15, 2014, there Anesthesia placed an arterial blood pressure monitoring line and central venous line.  She was taken to the operating room, anesthetized, and intubated with a double-lumen endotracheal tube. Intravenous antibiotics were administered.  A Foley catheter was placed. Sequential compression devices were placed on the legs for DVT prophylaxis.  She was placed in a left lateral decubitus position and the right chest was prepped and draped in usual fashion.  Single lung ventilation of the left lung was initiated and was tolerated well throughout the procedure.  An incision was made in the seventh intercostal space in the midaxillary Line. It was carried through the skin and subcutaneous tissue.  The chest was entered bluntly using a 5-mm port.  The thoracoscope was advanced into the chest. The right lung was very slow to deflate.  An incision was made in approximately the fourth intercostal space anterolaterally. No rib spreading was performed during the procedure.  The fissures were relatively complete.  The lung was difficult to manipulate. As we were waiting for the lung to deflate, the initial dissection was started in the fissure.  The pulmonary artery was dissected out.  The lower lobe branches and the posterior  ascending upper lobe branch were identified.  The mass was palpated and was in the posterior segment of the right upper lobe.  It did seem amenable to segmentectomy.  The superior segment of the lower lobe was palpated and there was a diffuse nodularity but there was an area that was more coarsely nodular corresponding to the finding on CT.  The wedge resection was performed with sequential firings of an endoscopic GIA stapler incorporating this area.  With the first firing of the stapler, the parenchyma tore and there was rapid venous bleeding, this was controlled initially  by clamping. And then additional firings of the stapler resulted in a good hemostasis.  The specimen was sent for frozen section examination.  The pathologist did find a small focus of well-differentiated adenocarcinoma.    The right upper lobe posterior ascending pulmonary arterial branch was dissected out. A node adjacent to the artery was sent for permanent pathology.  The artery was encircled and an endoscopic GIA vascular stapler was passed around the artery and fired dividing it.  Next, the bronchus was dissected out. The trifurcation of the upper lobe bronchus was identified.  The stapler was placed across the posterior segmental branch and closed but not fired.  A test inflation of the right lung showed good aeration of the lower and middle lobes as well as the anterior and apical segments of the right upper lobe.  The stapler was fired transecting the bronchus. After inflating the right lung, the line for the segmental division was Apparent. Again with initial firing of an endoscopic GIA stapler with a black cartridge, there was significant venous bleeding, there also was a tear of the pulmonary vein branch in the fissure which required a 6-0 Prolene suture.  Ultimately, the segmentectomy was completed.  The specimen was placed into an endoscopic retrieval bag and removed through the incision.  There was a 2-cm gross margin on the tumor and it was sent to Pathology for frozen section which revealed adenocarcinoma with clear surgical margins.  It should be noted that during the dissection of the segmental bronchus that level 11 and 12 nodes were taken and sent as separate specimens. There was good hemostasis at all staple lines.  The pleura was incised posteriorly at the hilum and the subcarinal nodes were dissected out.  A small node was present.  This was taken and sent for pathology.  The pleura then was scored over the azygous vein, and the 4R right paratracheal nodes were  taken and sent for permanent pathology as well. An On-Q local anesthetic catheter was placed through a separate stab wound incision posteriorly and tunneled into a subpleural location.  It was flushed with 5 mL of 0.5% Marcaine, and later hooked to the On-Q bulb.  A final inspection was made for hemostasis, ProGEL was applied to the staple lines.  A 32-French Blake drain was placed through the original port incision and directed posteriorly. Aa 28-French chest tube was placed through a second port incision, and directed anteriorly and apically. Both were secured with #1 silk sutures.  The right lung was reinflated.  There was good aeration of the remainder of the upper lobe as well as the lower and middle lobes.  The utility incision was closed with #1 Vicryl fascial suture followed by 2-0 Vicryl subcutaneous suture and a 3-0 Vicryl subcuticular suture.  All sponge, needle, and instrument counts were correct at the end of procedure.  The patient was extubated in the operating room and taken  to the postanesthetic care unit in good condition.     Revonda Standard Roxan Hockey, M.D.     SCH/MEDQ  D:  07/15/2014  T:  07/16/2014  Job:  [id number]

## 2014-07-17 ENCOUNTER — Inpatient Hospital Stay (HOSPITAL_COMMUNITY): Payer: Medicare HMO

## 2014-07-17 LAB — COMPREHENSIVE METABOLIC PANEL
ALK PHOS: 47 U/L (ref 39–117)
ALT: 10 U/L (ref 0–35)
AST: 12 U/L (ref 0–37)
Albumin: 2.8 g/dL — ABNORMAL LOW (ref 3.5–5.2)
Anion gap: 9 (ref 5–15)
BUN: 12 mg/dL (ref 6–23)
CO2: 30 meq/L (ref 19–32)
Calcium: 8.5 mg/dL (ref 8.4–10.5)
Chloride: 99 mEq/L (ref 96–112)
Creatinine, Ser: 0.59 mg/dL (ref 0.50–1.10)
GFR calc Af Amer: >90 mL/min (ref >90)
GFR, EST NON AFRICAN AMERICAN: 89 mL/min — AB (ref >90)
Glucose, Bld: 132 mg/dL — ABNORMAL HIGH (ref 70–99)
POTASSIUM: 3.8 meq/L (ref 3.7–5.3)
SODIUM: 138 meq/L (ref 137–147)
Total Bilirubin: 0.4 mg/dL (ref 0.3–1.2)
Total Protein: 6.2 g/dL (ref 6.0–8.3)

## 2014-07-17 LAB — CBC
HCT: 37 % (ref 36.0–46.0)
HEMOGLOBIN: 11.5 g/dL — AB (ref 12.0–15.0)
MCH: 27.8 pg (ref 26.0–34.0)
MCHC: 31.1 g/dL (ref 30.0–36.0)
MCV: 89.4 fL (ref 78.0–100.0)
Platelets: 147 10*3/uL — ABNORMAL LOW (ref 150–400)
RBC: 4.14 MIL/uL (ref 3.87–5.11)
RDW: 14.9 % (ref 11.5–15.5)
WBC: 9.1 10*3/uL (ref 4.0–10.5)

## 2014-07-17 LAB — PROTIME-INR
INR: 1.26 (ref 0.00–1.49)
Prothrombin Time: 15.8 seconds — ABNORMAL HIGH (ref 11.6–15.2)

## 2014-07-17 MED ORDER — METOCLOPRAMIDE HCL 5 MG/ML IJ SOLN
10.0000 mg | Freq: Three times a day (TID) | INTRAMUSCULAR | Status: AC
  Administered 2014-07-17 (×3): 10 mg via INTRAVENOUS
  Filled 2014-07-17 (×3): qty 2

## 2014-07-17 MED ORDER — LEVALBUTEROL HCL 0.63 MG/3ML IN NEBU
0.6300 mg | INHALATION_SOLUTION | Freq: Three times a day (TID) | RESPIRATORY_TRACT | Status: DC
  Administered 2014-07-17 – 2014-07-19 (×6): 0.63 mg via RESPIRATORY_TRACT
  Filled 2014-07-17 (×12): qty 3

## 2014-07-17 NOTE — Clinical Documentation Improvement (Signed)
Please acknowledge the diagnosis noted in Pathology Report using your own words (no copy and paste) as Coders can't code from these reports; must be documented by physician in the Medical Record.   Thank You, Zoila Shutter ,RN Clinical Documentation Specialist:  [facility] [employer] [facility]

## 2014-07-17 NOTE — Progress Notes (Signed)
2 Days Post-Op Procedure(s) (LRB): VIDEO ASSISTED THORACOSCOPY (VATS)/RLL WEDGE RESECTION, Lymph Node Sampling with placement of On Q Pump. (Right) RUL SEGMENTECTOMY (Right) Subjective: [name]/o pain from tubes, didn't sleep well  Objective: Vital signs in last 24 hours: Temp:  [97.8 F (36.6 [name])-99.3 F (37.4 [name])] 98.2 F (36.8 [name]) (07/22 0351) Pulse Rate:  [76-98] 81 (07/22 0700) Cardiac Rhythm:  [-] Atrial fibrillation (07/22 0600) Resp:  [15-23] 20 (07/22 0737) BP: (72-142)/(44-125) 111/57 mmHg (07/22 0700) SpO2:  [93 %-99 %] 98 % (07/22 0737) Arterial Line BP: (114-131)/(52-57) 114/52 mmHg (07/21 1000) Weight:  [286 lb 9.6 oz (130 kg)] 286 lb 9.6 oz (130 kg) (07/22 0500)  Hemodynamic parameters for last 24 hours:    Intake/Output from previous day: 07/21 0701 - 07/22 0700 In: 2225 [P.O.:960; I.V.:965; IV Piggyback:300] Out: 1520 [Urine:1175; Chest Tube:345] Intake/Output this shift:    General appearance: alert and uncomfortable Neurologic: intact Heart: irregularly irregular rhythm Lungs: diminished breath sounds bilaterally Abdomen: soft, nontender, hypoactive BS no air leak, serous drainage from CT  Lab Results:  Recent Labs  07/16/14 0410 07/17/14 0403  WBC 9.7 9.1  HGB 11.2* 11.5*  HCT 36.3 37.0  PLT 138* 147*   BMET:  Recent Labs  07/16/14 0410 07/17/14 0403  NA 140 138  K 3.7 3.8  CL 100 99  CO2 27 30  GLUCOSE 146* 132*  BUN 15 12  CREATININE 0.53 0.59  CALCIUM 8.1* 8.5    PT/INR:  Recent Labs  07/17/14 0403  LABPROT 15.8*  INR 1.26   ABG    Component Value Date/Time   PHART 7.368 07/16/2014 0418   HCO3 29.6* 07/16/2014 0418   TCO2 31 07/16/2014 0418   O2SAT 93.0 07/16/2014 0418   CBG (last 3)  No results found for this basename: GLUCAP,  in the last 72 hours  Assessment/Plan: S/P Procedure(s) (LRB): VIDEO ASSISTED THORACOSCOPY (VATS)/RLL WEDGE RESECTION, Lymph Node Sampling with placement of On Q Pump. (Right) RUL SEGMENTECTOMY  (Right) Plan for transfer to step-down: see transfer orders POD # 2 CV- still in a fib, rate controlled  Coumadin  RESP- bibasilar atelectasis + RUL atelectasis- IS, flutter  Needs to improve pulmonary hygiene, only 500 ml with IS  RENAL- lytes, creatinine OK  GI- hypoactive BS, reglan for 24 hours  DVT prophylaxis- SCD + enoxaparin until INR > 2  OOB, Ambulate  PATH- T4, N0-- two separate primaries most likely- discussed with patient   LOS: 2 days    Tammie [name] 07/17/2014

## 2014-07-18 ENCOUNTER — Inpatient Hospital Stay (HOSPITAL_COMMUNITY): Payer: Medicare HMO

## 2014-07-18 LAB — CBC
HCT: 33.5 % — ABNORMAL LOW (ref 36.0–46.0)
Hemoglobin: 10.4 g/dL — ABNORMAL LOW (ref 12.0–15.0)
MCH: 27.2 pg (ref 26.0–34.0)
MCHC: 31 g/dL (ref 30.0–36.0)
MCV: 87.7 fL (ref 78.0–100.0)
Platelets: 168 10*3/uL (ref 150–400)
RBC: 3.82 MIL/uL — ABNORMAL LOW (ref 3.87–5.11)
RDW: 15.1 % (ref 11.5–15.5)
WBC: 9.2 10*3/uL (ref 4.0–10.5)

## 2014-07-18 LAB — BASIC METABOLIC PANEL
Anion gap: 9 (ref 5–15)
BUN: 15 mg/dL (ref 6–23)
CO2: 30 mEq/L (ref 19–32)
CREATININE: 0.61 mg/dL (ref 0.50–1.10)
Calcium: 8.8 mg/dL (ref 8.4–10.5)
Chloride: 100 mEq/L (ref 96–112)
GFR calc non Af Amer: 88 mL/min — ABNORMAL LOW (ref >90)
Glucose, Bld: 107 mg/dL — ABNORMAL HIGH (ref 70–99)
Potassium: 4 mEq/L (ref 3.7–5.3)
Sodium: 139 mEq/L (ref 137–147)

## 2014-07-18 LAB — PROTIME-INR
INR: 1.31 (ref 0.00–1.49)
Prothrombin Time: 16.3 seconds — ABNORMAL HIGH (ref 11.6–15.2)

## 2014-07-18 MED ORDER — METOPROLOL TARTRATE 50 MG PO TABS
50.0000 mg | ORAL_TABLET | Freq: Two times a day (BID) | ORAL | Status: DC
  Administered 2014-07-18 (×2): 50 mg via ORAL
  Filled 2014-07-18 (×4): qty 1

## 2014-07-18 MED ORDER — DIPHENHYDRAMINE HCL 25 MG PO CAPS: 25.0000 mg | ORAL_CAPSULE | Freq: Every evening | ORAL | Status: DC | PRN

## 2014-07-18 NOTE — Progress Notes (Signed)
Chest tube removed per order. Procedure explained to pt, pt verbalizes understanding. No s/s of acute distress noted.

## 2014-07-18 NOTE — Progress Notes (Addendum)
[address]       [place] [address]             [phone]          3 Days Post-Op Procedure(s) (LRB): VIDEO ASSISTED THORACOSCOPY (VATS)/RLL WEDGE RESECTION, Lymph Node Sampling with placement of On Q Pump. (Right) RUL SEGMENTECTOMY (Right)  Subjective: Sore, coughing up some yellow sputum.     Objective: Vital signs in last 24 hours: Patient Vitals for the past 24 hrs:  BP Temp Temp src Pulse Resp SpO2  07/18/14 0811 - - - - - 99 %  07/18/14 0736 - - - - 15 98 %  07/18/14 0725 119/66 mmHg - - 88 14 98 %  07/18/14 0724 - 98.4 F (36.9 C) Oral - - -  07/18/14 0344 134/58 mmHg 98.3 F (36.8 C) Oral 76 21 99 %  07/17/14 2352 129/67 mmHg 98.6 F (37 C) Oral 82 17 98 %  07/17/14 2135 157/78 mmHg - - 93 - -  07/17/14 2036 - - - - - 100 %  07/17/14 2021 157/74 mmHg 98.6 F (37 C) Oral 89 15 100 %  07/17/14 1700 137/99 mmHg - - 84 20 98 %  07/17/14 1600 116/69 mmHg - - 83 23 97 %  07/17/14 1500 115/53 mmHg 98.6 F (37 C) Oral 77 23 97 %  07/17/14 1434 - - - - - 99 %  07/17/14 1400 123/65 mmHg - - 74 18 99 %  07/17/14 1300 120/70 mmHg - - 66 22 98 %  07/17/14 1246 - 98.2 F (36.8 C) Oral - - -  07/17/14 1200 134/72 mmHg - - 86 20 96 %  07/17/14 1100 126/83 mmHg - - 82 21 99 %  07/17/14 1000 108/77 mmHg - - 73 16 98 %  07/17/14 0900 125/77 mmHg - - 90 15 98 %   Current Weight  07/17/14 286 lb 9.6 oz (130 kg)     Intake/Output from previous day: 07/22 0701 - 07/23 0700 In: 645 [P.O.:360; I.V.:285] Out: 690 [Urine:460; Chest Tube:230]    PHYSICAL EXAM:  Heart: Irr irr, HR 70-80s Lungs: Coarse bilateral BS Wound: Clean and dry Chest tube: No air leak    Lab Results: CBC: Recent Labs  07/17/14 0403 07/18/14 0350  WBC 9.1 9.2  HGB 11.5* 10.4*  HCT 37.0 33.5*  PLT 147* 168   BMET:  Recent Labs  07/17/14 0403 07/18/14 0350  NA 138 139  K 3.8 4.0  CL 99 100  CO2 30 30  GLUCOSE 132* 107*  BUN 12 15  CREATININE 0.59  0.61  CALCIUM 8.5 8.8    PT/INR:  Recent Labs  07/18/14 0350  LABPROT 16.3*  INR 1.31   CXR: FINDINGS:  Stable cardiomegaly. One right-sided chest tube has been removed.  Remaining right-sided chest tube is noted with tip in a more  superior position than noted previously. Stable small right apical  pneumothorax is noted. No significant pleural effusion is noted.  Increased right suprahilar opacity is noted concerning for worsening  atelectasis or pneumonia. Right internal jugular catheter line noted  on prior exam remains with distal tip in expected position of the  SVC. Patient position is rotated to the left.  IMPRESSION:  One chest tube has been removed. Stable small right apical  pneumothorax. Increased right suprahilar opacity is noted concerning  for worsening atelectasis or pneumonia.    Assessment/Plan: S/P Procedure(s) (LRB): VIDEO ASSISTED THORACOSCOPY (  VATS)/RLL WEDGE RESECTION, Lymph Node Sampling with placement of On Q Pump. (Right) RUL SEGMENTECTOMY (Right)  CT with no air leak, CXR with stable small apical ptx. Hopefully can d/c remaining CT soon.  Pulm- Remains on 2L, increasing atelectasis/opacity on CXR.  Productive cough with some purulent sputum.  Continue aggressive pulm toilet, IS/FV, Mucinex, nebs.  WBC stable and afebrile, so will hold off on abx for now.  CV- rate controlled AF, continue Coumadin.  BPs trending up, will increase beta blocker and watch.  Back on HCTZ already.  D/c On-Q, continue ambulation as tolerated.    LOS: 3 days    COLLINS,GINA H 07/18/2014  Addendum PATH shows adenocarcinoma in both the right upper and lower lobe lesions T4N0 due to synchronous ipsilateral cancers. Tumor will be sent for genetic testing  Patient seen and examined, agree with above She does not have an air leak despite small pneumo/ space on CXR- will dc CT as drainage has come down Dc PCA, use PO pain meds

## 2014-07-18 NOTE — Care Management Note (Signed)
    Page 1 of 1   07/20/2014     3:59:37 PM CARE MANAGEMENT NOTE 07/20/2014  Patient:  Tammie [name], Tammie [name]   Account Number:  [id number]  Date Initiated:  07/18/2014  Documentation initiated by:  Marvetta Gibbons  Subjective/Objective Assessment:   Pt admitted s/p VATS     Action/Plan:   PTA pt lived at home- anticipate return home at discharge   Anticipated DC Date:  07/20/2014   Anticipated DC Plan:  [place]  CM consult      Choice offered to / List presented to:     DME arranged  [facility]      DME agency  [facility].        Status of service:  In process, will continue to follow Medicare Important Message given?  YES (If response is "NO", the following Medicare IM given date fields will be blank) Date Medicare IM given:  07/18/2014 Medicare IM given by:  Marvetta Gibbons Date Additional Medicare IM given:   Additional Medicare IM given by:    Discharge Disposition:  HOME/SELF CARE  Per UR Regulation:  Reviewed for med. necessity/level of care/duration of stay  If discussed at Long Length of Stay Meetings, dates discussed:    Comments:  11:00 CM received call from RN requesting O2 arrangements. CM called DME rep.  Saturation note in system.  No other CM needs were communicated.  O2 will be delivered to room prior to discharge.  Mariane Masters, BSN, [place].  07/20/14 07/19/14- 1215- Marvetta Gibbons RN,, BSN [phone] Pt will most likely need home 02 at discharge- orders in epic- RN to document qualifying note for RN sats- spoke with [facility] with [facility]- who is to f/u on orders and will deliver 02 to room prior to discharge.

## 2014-07-19 ENCOUNTER — Inpatient Hospital Stay (HOSPITAL_COMMUNITY): Payer: Medicare HMO

## 2014-07-19 ENCOUNTER — Encounter: Payer: Self-pay | Admitting: *Deleted

## 2014-07-19 LAB — PROTIME-INR
INR: 1.15 (ref 0.00–1.49)
Prothrombin Time: 14.7 seconds (ref 11.6–15.2)

## 2014-07-19 MED ORDER — WARFARIN SODIUM 5 MG PO TABS
5.0000 mg | ORAL_TABLET | Freq: Once | ORAL | Status: AC
  Administered 2014-07-19: 5 mg via ORAL
  Filled 2014-07-19: qty 1

## 2014-07-19 MED ORDER — WARFARIN SODIUM 2.5 MG PO TABS
2.5000 mg | ORAL_TABLET | Freq: Every day | ORAL | Status: DC
  Filled 2014-07-19: qty 1

## 2014-07-19 MED ORDER — METOPROLOL TARTRATE 100 MG PO TABS
100.0000 mg | ORAL_TABLET | Freq: Two times a day (BID) | ORAL | Status: DC
Start: 2014-07-19 — End: 2014-07-20
  Administered 2014-07-19 – 2014-07-20 (×3): 100 mg via ORAL
  Filled 2014-07-19 (×4): qty 1

## 2014-07-19 NOTE — Progress Notes (Signed)
Utilization review completed.  

## 2014-07-19 NOTE — Progress Notes (Signed)
Fentanyl PCA d/c's and sasted 48ml(130mcg) of Fentanyl down the sink with Katharina Caper, RN

## 2014-07-19 NOTE — Progress Notes (Signed)
SATURATION QUALIFICATIONS: (This note is used to comply with regulatory documentation for home oxygen)  Patient Saturations on Room Air at Rest = 92%  Patient Saturations on Room Air while Ambulating = 85%  Patient Saturations on 2 Liters of oxygen while Ambulating = 93%  Please briefly explain why patient needs home oxygen: COPD, lung CA, s/p lobectomy

## 2014-07-19 NOTE — Progress Notes (Signed)
Called pathology dept.  [facility] on 07/19/14

## 2014-07-19 NOTE — Discharge Summary (Signed)
Physician Discharge Summary       [address]       [place] [address]             [phone]    Patient ID: Tammie [name] MRN: [id number] DOB/AGE: 03/27/1941 73 y.o.  Admit date: 07/15/2014 Discharge date: 07/20/2014  Admission Diagnoses: 1. Right upper and lower lung nodules 2. History of remote tobacco abuse 3. History of left lung cancer (s/p wedge 12') 4. History of mini stroke 5. History of hyperlipidemia 6. History of obesity 7. History of hypertension 8. History of atrial fibrillation 9. History of breast cancer 10. History of anemia  Discharge Diagnoses:   1. Invasive adenocarcinoma, moderately differentiated (T4, N0) 2. History of remote tobacco abuse 3. History of left lung cancer (s/p wedge '12) 4. History of mini stroke 5. History of hyperlipidemia 6. History of obesity 7. History of hypertension 8. History of atrial fibrillation 9. History of breast cancer 10. History of anemia   Procedure (s):  Right video-assisted thoracoscopy, wedge resection of  superior segment, right lower lobe, posterior segmentectomy right upper  lobe, with lymph node dissection, On-Q local anesthetic catheter placement by Dr. Roxan Hockey on 07/16/2014.  Pathology: Diagnosis 1. Lung, wedge biopsy/resection, Right lower wedge - INVASIVE ADENOCARCINOMA, MODERATELY DIFFERENTIATED, SPANNING 0.6 CM. - SECOND FOCUS OF ADENOCARCINOMA IN SITU, SPANNING 1.2 CM. - THE SURGICAL RESECTION MARGINS ARE NEGATIVE FOR ADENOCARCINOMA. - SEE ONCOLOGY TABLE BELOW. 2. Lung, resection (segmental or lobe), Posterior segment right upper lobe mass and margins - INVASIVE ADENOCARCINOMA, WELL DIFFERENTIATED, SPANNING 2.0 CM. - THE SURGICAL RESECTION MARGINS ARE NEGATIVE FOR ADENOCARCINOMA. - SEE ONCOLOGY TABLE BELOW. 3. Lymph node, biopsy, 11 - THERE IS NO EVIDENCE OF CARCINOMA IN 1 OF 1 LYMPH NODE (0/1). 4. Lymph node, biopsy, 12 - THERE IS NO EVIDENCE OF CARCINOMA IN 1 OF 1  LYMPH NODE (0/1). 5. Lymph node, biopsy, 11 #2 - THERE IS NO EVIDENCE OF CARCINOMA IN 1 OF 1 LYMPH NODE (0/1). 6. Lymph node, biopsy, 7 - THERE IS NO EVIDENCE OF CARCINOMA IN 1 OF 1 LYMPH NODE (0/1). 7. Lymph node, biopsy, 4R #1 - THERE IS NO EVIDENCE OF CARCINOMA IN 1 OF 1 LYMPH NODE (0/1). 8. Lymph node, biopsy, 4R #2 - THERE IS NO EVIDENCE OF CARCINOMA IN 1 OF 1 LYMPH NODE (0/1). 9. Lymph node, biopsy, 4R #3 - THERE IS NO EVIDENCE OF CARCINOMA IN 1 OF 1 LYMPH NODE (0/1).  TNM code: pT4, pN0  History of Presenting Illness: This is a 73 year old woman with a remote history of heavy tobacco abuse and a prior history of lung cancer. She had a wedge resection on the left side by Dr. Arlyce Dice in 2012 for a stage IA adenocarcinoma. She also has a history of early stage breast cancer. In January she had lumpectomy followed by radiation for breast cancer. She has been followed with CT scans over the years. She has had multiple groundglass opacities in the lungs which were stable over time. However, on her most recent CTA, a more solid nodule in the right upper lobe had increased in size. A PET CT showed the nodule to be hypermetabolic. There was no evidence of regional or distant metastases. The other ground glass opacities were not hypermetabolic.  I saw her last week and we reviewed the films and discussed her options. She had not yet had PFTs.  I did communicate with Dr. Percival Spanish who felt additional cardiac studies would not be indicated in  the absence of a change in status since her most recent studies were done within the past year. She has not noted any recent change in exercise capacity.  She smoked 2-3 packs a day for many years before quitting in 1991. 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 She denies any previous history of myocardial infarction, but did note that her CT reported coronary artery disease. She does have a history of chronic atrial fibrillation and is on  Coumadin for that. She says that she get short of breath with exertion. She get short of breath walking to her mailbox. She says she can get up a flight of stairs but would have to stop. She has had a productive cough, but no hemoptysis. She does have frequent wheezing. She has gained weight over the past 6 months with her weight increasing from 260 to 284 pounds.   Brief Hospital Course:  Patient has been afebrile and hemodynamically stable. A line and foley were removed early in her post operative course. Chest tubes had decreasing output and no air leak. Daily chest x rays remained stable. She did remain in a fib, which is chronic. She was restarted on Lopressor and Coumadin. PT and INR were monitored daily. Last INR was 1.16. She has weaned down to 2 liters of oxygen via [place]. She will need oxygen at discharge, as she continues to desat with exertion.  She was felt surgically stable for transfer from the ICU to 3300 for further convalescence on 07/17/2014. All chest tubes were removed by 7/24. On-Q and PCA have been removed. Pathology results were discussed with the patient by Dr. Roxan Hockey. She has been tolerating a diet and has had a bowel movement. She is surgically stable for discharge on 07/20/2014.    Latest Vital Signs: Blood pressure 133/73, pulse 75, temperature 98.2 F (36.8 C), temperature source Oral, resp. rate 23, height 5\' 11"  (1.803 m), weight 286 lb 9.6 oz (130 kg), SpO2 97.00%.  Physical Exam: Heart: Irr irr  Lungs: Decreased BS in bases, overall clear  Wound: Clean and dry   Discharge Condition:Stable  Recent laboratory studies:  Lab Results  Component Value Date   WBC 9.2 07/18/2014   HGB 10.4* 07/18/2014   HCT 33.5* 07/18/2014   MCV 87.7 07/18/2014   PLT 168 07/18/2014   Lab Results  Component Value Date   NA 139 07/18/2014   K 4.0 07/18/2014   CL 100 07/18/2014   CO2 30 07/18/2014   CREATININE 0.61 07/18/2014   GLUCOSE 107* 07/18/2014      Diagnostic Studies: Dg  Chest 2 View  07/19/2014   CLINICAL DATA:  73 year old female status post partial pneumonectomy in both lungs, recently on the right. Initial encounter.  EXAM: CHEST  2 VIEW  COMPARISON:  07/18/2014 and earlier.  FINDINGS: Postoperative changes about both hila. Right IJ central line remains in place. No pneumothorax. Mildly decreased right suprahilar patchy opacity. Stable cardiac size and mediastinal contours. No pleural effusion or pulmonary edema. No areas of worsening ventilation.  IMPRESSION: Recent postoperative changes to the right lung with mildly regressed postoperative opacity. Stable postoperative appearance of the left lung. No new cardiopulmonary abnormality.   Electronically Signed   By: Lars Pinks M.D.   On: 07/19/2014 07:47    Discharge Medications:   Medication List         acetaminophen 650 MG CR tablet  Commonly known as:  TYLENOL  Take 650 mg by mouth every  8 (eight) hours as needed for pain.     albuterol 108 (90 BASE) MCG/ACT inhaler  Commonly known as:  PROVENTIL HFA;VENTOLIN HFA  Inhale 2 puffs into the lungs every 6 (six) hours as needed for wheezing or shortness of breath.     ANTACID PO  Take 1 tablet by mouth daily as needed (for reflux).     diphenhydrAMINE 25 MG tablet  Commonly known as:  BENADRYL  Take 25 mg by mouth at bedtime as needed for sleep.     diphenhydramine-acetaminophen 25-500 MG Tabs  Commonly known as:  TYLENOL PM  Take 1 tablet by mouth at bedtime as needed (for sleep).     Garlic 9147 MG Caps  Take 1,000 mg by mouth 2 (two) times daily.     hyaluronate sodium Gel  Apply 1 application topically 2 (two) times daily.     hydrochlorothiazide 12.5 MG capsule  Commonly known as:  MICROZIDE  TAKE ONE CAPSULE BY MOUTH TWICE DAILY     metoprolol 100 MG tablet  Commonly known as:  LOPRESSOR  Take 1 tablet (100 mg total) by mouth 2 (two) times daily.     oxyCODONE 5 MG immediate release tablet  Commonly known as:  Oxy IR/ROXICODONE  Take  1-2 tablets (5-10 mg total) by mouth every 4 (four) hours as needed for severe pain.     simethicone 125 MG chewable tablet  Commonly known as:  MYLICON  Chew 829 mg by mouth every 6 (six) hours as needed for flatulence.     simvastatin 10 MG tablet  Commonly known as:  ZOCOR  Take 0.5 tablets (5 mg total) by mouth daily.     vitamin A & D ointment  Apply 1 application topically 2 (two) times daily.     warfarin 2.5 MG tablet  Commonly known as:  COUMADIN  Take 1 tablet (2.5 mg total) by mouth daily.         Follow Up Appointments: Follow-up Information   Follow up with Melrose Nakayama, MD On 07/24/2014. (Appointment is with nurse only to remove chest tube sutures. Appointment time is at 10:00 am)    Specialty:  Cardiothoracic Surgery   Contact information:   [address] [place] [address] [phone]       Follow up with Melrose Nakayama, MD On 07/30/2014. (PA/LAT CXR to be taken (at [facility] which is in the same building as Dr. Leonarda Salon office) on 07/30/2014 at 10:00 am;Appointment with Dr. Roxan Hockey is at 11:00 am)    Specialty:  Cardiothoracic Surgery   Contact information:   [address] [place] [address] [phone]       Please follow up. (Please have bloodwork checked for monitoring Coumadin in the next week)       Signed: COLLINS,GINA HPA-C 07/20/2014, 9:17 AM

## 2014-07-19 NOTE — Progress Notes (Addendum)
       [address]       [place] [address]             [phone]          4 Days Post-Op Procedure(s) (LRB): VIDEO ASSISTED THORACOSCOPY (VATS)/RLL WEDGE RESECTION, Lymph Node Sampling with placement of On Q Pump. (Right) RUL SEGMENTECTOMY (Right)  Subjective: Feels well, no complaints.   Objective: Vital signs in last 24 hours: Patient Vitals for the past 24 hrs:  BP Temp Temp src Pulse Resp SpO2  07/19/14 0702 117/80 mmHg - - 83 18 97 %  07/19/14 0700 - 98 F (36.7 C) Oral - - -  07/19/14 0457 143/83 mmHg 98.8 F (37.1 C) Oral 74 27 99 %  07/19/14 0030 147/89 mmHg 99.2 F (37.3 C) Oral 91 20 93 %  07/18/14 2043 - - - 86 24 98 %  07/18/14 1519 112/70 mmHg 98.3 F (36.8 C) Oral 81 20 99 %  07/18/14 1457 - - - - - 100 %  07/18/14 1241 137/64 mmHg - - 79 20 98 %  07/18/14 1105 - 98 F (36.7 C) Oral - - -   Current Weight  07/17/14 286 lb 9.6 oz (130 kg)     Intake/Output from previous day: 07/23 0701 - 07/24 0700 In: 366 [P.O.:240; I.V.:126] Out: 2050 [Urine:2050]    PHYSICAL EXAM:  Heart: Irr irr Lungs: Decreased BS in bases, overall clear Wound: Clean and dry   Lab Results: CBC: Recent Labs  07/17/14 0403 07/18/14 0350  WBC 9.1 9.2  HGB 11.5* 10.4*  HCT 37.0 33.5*  PLT 147* 168   BMET:  Recent Labs  07/17/14 0403 07/18/14 0350  NA 138 139  K 3.8 4.0  CL 99 100  CO2 30 30  GLUCOSE 132* 107*  BUN 12 15  CREATININE 0.59 0.61  CALCIUM 8.5 8.8    PT/INR:  Recent Labs  07/19/14 0530  LABPROT 14.7  INR 1.15    CXR: FINDINGS:  Postoperative changes about both hila. Right IJ central line remains  in place. No pneumothorax. Mildly decreased right suprahilar patchy  opacity. Stable cardiac size and mediastinal contours. No pleural  effusion or pulmonary edema. No areas of worsening ventilation.  IMPRESSION:  Recent postoperative changes to the right lung with mildly regressed  postoperative opacity. Stable  postoperative appearance of the left  lung. No new cardiopulmonary abnormality.     Assessment/Plan: S/P Procedure(s) (LRB): VIDEO ASSISTED THORACOSCOPY (VATS)/RLL WEDGE RESECTION, Lymph Node Sampling with placement of On Q Pump. (Right) RUL SEGMENTECTOMY (Right) Pulm- Remains on 2L O2. Continue aggressive pulm toilet, IS/FV, Mucinex, nebs. Wean O2 as tolerated.  May need home O2. CV- rate controlled AF, continue Coumadin. BPs improving, titrate beta blocker to home dose. Ambulate in halls.   Possibly home in next 1-2 days, +/- home O2.   LOS: 4 days    COLLINS,GINA H 07/19/2014  Patient seen and examined, agree with above Try to wean O2

## 2014-07-20 ENCOUNTER — Inpatient Hospital Stay (HOSPITAL_COMMUNITY): Payer: Medicare HMO

## 2014-07-20 LAB — PROTIME-INR
INR: 1.16 (ref 0.00–1.49)
PROTHROMBIN TIME: 14.8 s (ref 11.6–15.2)

## 2014-07-20 MED ORDER — OXYCODONE HCL 5 MG PO TABS: 5.0000 mg | ORAL_TABLET | ORAL | Status: DC | PRN

## 2014-07-20 NOTE — Progress Notes (Signed)
       [address]       [place] [address]             [phone]          5 Days Post-Op Procedure(s) (LRB): VIDEO ASSISTED THORACOSCOPY (VATS)/RLL WEDGE RESECTION, Lymph Node Sampling with placement of On Q Pump. (Right) RUL SEGMENTECTOMY (Right)  Subjective: Comfortable. Breathing stable. Still requiring 2L O2 with exertion.  Objective: Vital signs in last 24 hours: Patient Vitals for the past 24 hrs:  BP Temp Temp src Pulse Resp SpO2  07/20/14 0844 133/73 mmHg 98.2 F (36.8 C) Oral 75 23 97 %  07/20/14 0406 144/61 mmHg 98.1 F (36.7 C) Oral 82 21 93 %  07/19/14 2354 161/85 mmHg 98.2 F (36.8 C) Oral 85 - 98 %  07/19/14 1939 155/82 mmHg 98 F (36.7 C) Oral 90 20 96 %  07/19/14 1508 137/68 mmHg 98.1 F (36.7 C) Oral 82 21 96 %  07/19/14 1134 156/75 mmHg - - - 17 98 %  07/19/14 1100 - 97.4 F (36.3 C) Oral - - -  07/19/14 0951 - - - - - 99 %   Current Weight  07/17/14 286 lb 9.6 oz (130 kg)     Intake/Output from previous day: 07/24 0701 - 07/25 0700 In: 720 [P.O.:720] Out: 1500 [Urine:1500]    PHYSICAL EXAM:  Heart: RRR Lungs: Slightly decreased BS in bases bilaterally Wound: Clean and dry    Lab Results: CBC: Recent Labs  07/18/14 0350  WBC 9.2  HGB 10.4*  HCT 33.5*  PLT 168   BMET:  Recent Labs  07/18/14 0350  NA 139  K 4.0  CL 100  CO2 30  GLUCOSE 107*  BUN 15  CREATININE 0.61  CALCIUM 8.8    PT/INR:  Recent Labs  07/20/14 0405  LABPROT 14.8  INR 1.16   CXR: FINDINGS:  Right IJ central line stable. Staple lines in bilateral suprahilar  regions. Right upper lung airspace consolidation stable. Bibasilar  interstitial edema or infiltrates slightly increased. Small left  pleural effusion. Atheromatous aorta. Spurring in the lower thoracic  spine.  IMPRESSION:  1. Some increase in bibasilar interstitial edema or infiltrates with  small left effusion.    Assessment/Plan: S/P Procedure(s)  (LRB): VIDEO ASSISTED THORACOSCOPY (VATS)/RLL WEDGE RESECTION, Lymph Node Sampling with placement of On Q Pump. (Right) RUL SEGMENTECTOMY (Right) Still desats with exertion.  Home O2 arranged.   Home today.  Instructions reviewed with patient.   LOS: 5 days    Tammie [name] H 07/20/2014

## 2014-07-25 ENCOUNTER — Encounter: Payer: Self-pay | Admitting: *Deleted

## 2014-07-25 ENCOUNTER — Other Ambulatory Visit: Payer: Self-pay | Admitting: Thoracic Surgery (Cardiothoracic Vascular Surgery)

## 2014-07-25 DIAGNOSIS — C3490 Malignant neoplasm of unspecified part of unspecified bronchus or lung: Secondary | ICD-10-CM

## 2014-07-25 NOTE — [facility] Oncology Navigator Note (Unsigned)
Called pathology requested foundation one to be sent on upper lobe surgery date 07/15/14 per [name].

## 2014-07-26 ENCOUNTER — Other Ambulatory Visit: Payer: Self-pay | Admitting: *Deleted

## 2014-07-26 ENCOUNTER — Encounter (INDEPENDENT_AMBULATORY_CARE_PROVIDER_SITE_OTHER): Payer: Self-pay

## 2014-07-26 DIAGNOSIS — G8918 Other acute postprocedural pain: Secondary | ICD-10-CM

## 2014-07-26 DIAGNOSIS — R319 Hematuria, unspecified: Secondary | ICD-10-CM

## 2014-07-26 DIAGNOSIS — C3410 Malignant neoplasm of upper lobe, unspecified bronchus or lung: Secondary | ICD-10-CM

## 2014-07-26 MED ORDER — OXYCODONE HCL 5 MG PO TABS: 5.0000 mg | ORAL_TABLET | ORAL | Status: DC | PRN

## 2014-07-27 LAB — URINALYSIS, MICROSCOPIC ONLY
CASTS: NONE SEEN
CRYSTALS: NONE SEEN
SQUAMOUS EPITHELIAL / LPF: NONE SEEN

## 2014-07-27 LAB — URINALYSIS, ROUTINE W REFLEX MICROSCOPIC
Bilirubin Urine: NEGATIVE
Glucose, UA: NEGATIVE mg/dL
Ketones, ur: NEGATIVE mg/dL
NITRITE: POSITIVE — AB
Protein, ur: NEGATIVE mg/dL
SPECIFIC GRAVITY, URINE: 1.02 (ref 1.005–1.030)
Urobilinogen, UA: 0.2 mg/dL (ref 0.0–1.0)
pH: 6 (ref 5.0–8.0)

## 2014-07-30 ENCOUNTER — Encounter: Payer: Self-pay | Admitting: Thoracic Surgery (Cardiothoracic Vascular Surgery)

## 2014-07-30 ENCOUNTER — Ambulatory Visit (INDEPENDENT_AMBULATORY_CARE_PROVIDER_SITE_OTHER): Payer: Self-pay | Admitting: Thoracic Surgery (Cardiothoracic Vascular Surgery)

## 2014-07-30 ENCOUNTER — Encounter: Payer: Self-pay | Admitting: *Deleted

## 2014-07-30 ENCOUNTER — Other Ambulatory Visit: Payer: Self-pay | Admitting: *Deleted

## 2014-07-30 ENCOUNTER — Ambulatory Visit
Admission: RE | Admit: 2014-07-30 | Discharge: 2014-07-30 | Disposition: A | Discharge status: Home / Self Care | Payer: Medicare HMO | Source: Ambulatory Visit | Attending: Thoracic Surgery (Cardiothoracic Vascular Surgery) | Admitting: Thoracic Surgery (Cardiothoracic Vascular Surgery)

## 2014-07-30 VITALS — BP 125/81 | HR 77 | Ht 71.0 in | Wt 272.0 lb

## 2014-07-30 DIAGNOSIS — C3410 Malignant neoplasm of upper lobe, unspecified bronchus or lung: Secondary | ICD-10-CM

## 2014-07-30 DIAGNOSIS — Z09 Encounter for follow-up examination after completed treatment for conditions other than malignant neoplasm: Secondary | ICD-10-CM

## 2014-07-30 DIAGNOSIS — C801 Malignant (primary) neoplasm, unspecified: Secondary | ICD-10-CM

## 2014-07-30 DIAGNOSIS — C3490 Malignant neoplasm of unspecified part of unspecified bronchus or lung: Secondary | ICD-10-CM

## 2014-07-30 DIAGNOSIS — R911 Solitary pulmonary nodule: Secondary | ICD-10-CM

## 2014-07-30 DIAGNOSIS — C3411 Malignant neoplasm of upper lobe, right bronchus or lung: Secondary | ICD-10-CM

## 2014-07-30 LAB — PROTIME-INR
INR: 1.41 (ref <1.50)
Prothrombin Time: 17.4 seconds — ABNORMAL HIGH (ref 11.6–15.2)

## 2014-07-30 LAB — URINE CULTURE: Colony Count: >=100000

## 2014-07-30 NOTE — Progress Notes (Unsigned)
Patient ID: Tammie [name], female   DOB: 1941/11/09, 73 y.o.   MRN: [id number] On 44/15 Mrs. Borawski returned to the office for removal of hre chest tube sites s/p R VATS R WEDGE RESECTION. Theses sites as well as the mini thoracotomy site were all very well healed. She requested a pain med refill and the P.A. signed a script for her.  She mentioned she was having hematuria and some burning on urination.  She says she does have kidney stones.  These were present before this surgery.  Her urologist is Dr. Risa Grill.  We sent her to [facility] to submit a urine for C@S .  I told her I would forward the result to Dr. Risa Grill also.  She will return next week with a cxr as scheduled.
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
## 2014-07-30 NOTE — Progress Notes (Signed)
Patient ID: Tammie [name], female   DOB: Oct 31, 1941, 73 y.o.   MRN: [id number] Results of recent UA with C@S  faxed to Dr. Cy Blamer office.  Tammie [name] has been in touch with him and he said he would treat as soon as the culture was finalized.  Marland Kitchen

## 2014-07-31 ENCOUNTER — Encounter: Payer: Self-pay | Admitting: *Deleted

## 2014-07-31 NOTE — Progress Notes (Signed)
Patient ID: Tammie [name], female   DOB: Aug 08, 1941, 73 y.o.   MRN: [id number] I called [name] with the results of her urine C@S . She has a 10 day supply of Cipro 500mg m.  This is one of the antibiotics suggested.  She said she will start today.  I faxed this info to her urologist, Dr. Risa Grill.

## 2014-08-06 ENCOUNTER — Encounter (HOSPITAL_COMMUNITY): Payer: Self-pay

## 2014-08-16 ENCOUNTER — Telehealth: Payer: Self-pay | Admitting: Pharmacist

## 2014-08-16 NOTE — Telephone Encounter (Signed)
Patient is past due to recheck INR - called to check on her because she recently has surgery to remove lung cancer.  INR was 1.41 (08/01/14) - current dose if 1/2 tablet daily.  patietn was told that INR result would be forwarded to our office be I did not received results.   Patient to increase dose to 1 tablet on Mondays and Fridays and 1/2 tablet all other days.  Next appt 08/22/14.

## 2014-08-21 ENCOUNTER — Encounter (HOSPITAL_COMMUNITY): Payer: Self-pay

## 2014-08-22 ENCOUNTER — Ambulatory Visit (INDEPENDENT_AMBULATORY_CARE_PROVIDER_SITE_OTHER): Payer: Medicare HMO | Admitting: Pharmacist

## 2014-08-22 DIAGNOSIS — I4891 Unspecified atrial fibrillation: Secondary | ICD-10-CM

## 2014-08-22 LAB — POCT INR: INR: 3.2

## 2014-08-22 NOTE — Patient Instructions (Signed)
Anticoagulation Dose Instructions as of 08/22/2014     Tammie [name] Tue Wed Thu Fri Sat   New Dose 2.5 mg 2.5 mg 2.5 mg 2.5 mg 2.5 mg 2.5 mg 2.5 mg    Description       Resume warfarin 5mg  1/2 tablet daily.       INR was 3.2 today

## 2014-08-23 ENCOUNTER — Other Ambulatory Visit: Payer: Self-pay | Admitting: Thoracic Surgery (Cardiothoracic Vascular Surgery)

## 2014-08-23 DIAGNOSIS — C3490 Malignant neoplasm of unspecified part of unspecified bronchus or lung: Secondary | ICD-10-CM

## 2014-08-27 ENCOUNTER — Telehealth: Payer: Self-pay | Admitting: *Deleted

## 2014-08-27 ENCOUNTER — Ambulatory Visit
Admission: RE | Admit: 2014-08-27 | Discharge: 2014-08-27 | Disposition: A | Discharge status: Home / Self Care | Payer: Medicare HMO | Source: Ambulatory Visit | Attending: Thoracic Surgery (Cardiothoracic Vascular Surgery) | Admitting: Thoracic Surgery (Cardiothoracic Vascular Surgery)

## 2014-08-27 ENCOUNTER — Encounter: Payer: Self-pay | Admitting: Thoracic Surgery (Cardiothoracic Vascular Surgery)

## 2014-08-27 ENCOUNTER — Ambulatory Visit (INDEPENDENT_AMBULATORY_CARE_PROVIDER_SITE_OTHER): Payer: Self-pay | Admitting: Thoracic Surgery (Cardiothoracic Vascular Surgery)

## 2014-08-27 VITALS — BP 157/78 | HR 68 | Resp 20 | Ht 71.0 in | Wt 272.0 lb

## 2014-08-27 DIAGNOSIS — C3410 Malignant neoplasm of upper lobe, unspecified bronchus or lung: Secondary | ICD-10-CM

## 2014-08-27 DIAGNOSIS — C3411 Malignant neoplasm of upper lobe, right bronchus or lung: Secondary | ICD-10-CM

## 2014-08-27 DIAGNOSIS — C3490 Malignant neoplasm of unspecified part of unspecified bronchus or lung: Secondary | ICD-10-CM
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
## 2014-08-27 NOTE — Telephone Encounter (Signed)
Patient called.  Gave her appt for [facility] 08/29/14 at 3:15 labs, 3:30 Dr. Pablo Ledger, 4:00 with [name]

## 2014-08-27 NOTE — Telephone Encounter (Signed)
Called pt and left vm message to call with appt for [place]

## 2014-08-29 ENCOUNTER — Encounter: Payer: Self-pay | Admitting: *Deleted

## 2014-08-29 ENCOUNTER — Encounter: Payer: Self-pay | Admitting: Internal Medicine

## 2014-08-29 ENCOUNTER — Other Ambulatory Visit (HOSPITAL_BASED_OUTPATIENT_CLINIC_OR_DEPARTMENT_OTHER): Payer: Medicare HMO

## 2014-08-29 ENCOUNTER — Telehealth: Payer: Self-pay | Admitting: Internal Medicine

## 2014-08-29 ENCOUNTER — Ambulatory Visit (HOSPITAL_BASED_OUTPATIENT_CLINIC_OR_DEPARTMENT_OTHER): Payer: Medicare HMO | Admitting: Internal Medicine

## 2014-08-29 ENCOUNTER — Ambulatory Visit (HOSPITAL_BASED_OUTPATIENT_CLINIC_OR_DEPARTMENT_OTHER): Payer: Medicare HMO

## 2014-08-29 ENCOUNTER — Ambulatory Visit: Payer: Medicare HMO | Admitting: Radiation Oncology

## 2014-08-29 ENCOUNTER — Ambulatory Visit: Payer: Medicare HMO | Attending: Physical Therapy | Admitting: Physical Therapy

## 2014-08-29 ENCOUNTER — Other Ambulatory Visit: Payer: Self-pay | Admitting: Medical Oncology

## 2014-08-29 VITALS — BP 180/98 | HR 83 | Temp 98.2°F | Resp 19 | Ht 71.0 in | Wt 272.8 lb

## 2014-08-29 DIAGNOSIS — C3410 Malignant neoplasm of upper lobe, unspecified bronchus or lung: Secondary | ICD-10-CM

## 2014-08-29 DIAGNOSIS — R5381 Other malaise: Secondary | ICD-10-CM | POA: Diagnosis not present

## 2014-08-29 DIAGNOSIS — J449 Chronic obstructive pulmonary disease, unspecified: Secondary | ICD-10-CM | POA: Diagnosis not present

## 2014-08-29 DIAGNOSIS — C3490 Malignant neoplasm of unspecified part of unspecified bronchus or lung: Secondary | ICD-10-CM | POA: Diagnosis not present

## 2014-08-29 DIAGNOSIS — Z853 Personal history of malignant neoplasm of breast: Secondary | ICD-10-CM | POA: Diagnosis not present

## 2014-08-29 DIAGNOSIS — Z87891 Personal history of nicotine dependence: Secondary | ICD-10-CM | POA: Insufficient documentation

## 2014-08-29 DIAGNOSIS — C3430 Malignant neoplasm of lower lobe, unspecified bronchus or lung: Secondary | ICD-10-CM

## 2014-08-29 DIAGNOSIS — I4891 Unspecified atrial fibrillation: Secondary | ICD-10-CM | POA: Insufficient documentation

## 2014-08-29 DIAGNOSIS — C3431 Malignant neoplasm of lower lobe, right bronchus or lung: Secondary | ICD-10-CM

## 2014-08-29 DIAGNOSIS — J4489 Other specified chronic obstructive pulmonary disease: Secondary | ICD-10-CM | POA: Insufficient documentation

## 2014-08-29 DIAGNOSIS — Z923 Personal history of irradiation: Secondary | ICD-10-CM | POA: Diagnosis not present

## 2014-08-29 DIAGNOSIS — Z8673 Personal history of transient ischemic attack (TIA), and cerebral infarction without residual deficits: Secondary | ICD-10-CM | POA: Insufficient documentation

## 2014-08-29 DIAGNOSIS — C801 Malignant (primary) neoplasm, unspecified: Secondary | ICD-10-CM

## 2014-08-29 DIAGNOSIS — C50919 Malignant neoplasm of unspecified site of unspecified female breast: Secondary | ICD-10-CM

## 2014-08-29 DIAGNOSIS — IMO0001 Reserved for inherently not codable concepts without codable children: Secondary | ICD-10-CM | POA: Insufficient documentation

## 2014-08-29 LAB — CBC WITH DIFFERENTIAL/PLATELET
BASO%: 1.1 % (ref 0.0–2.0)
Basophils Absolute: 0.1 10e3/uL (ref 0.0–0.1)
EOS%: 4.5 % (ref 0.0–7.0)
Eosinophils Absolute: 0.3 10e3/uL (ref 0.0–0.5)
HCT: 40.1 % (ref 34.8–46.6)
HGB: 12.7 g/dL (ref 11.6–15.9)
LYMPH%: 21.3 % (ref 14.0–49.7)
MCH: 26.2 pg (ref 25.1–34.0)
MCHC: 31.7 g/dL (ref 31.5–36.0)
MCV: 82.6 fL (ref 79.5–101.0)
MONO#: 0.5 10e3/uL (ref 0.1–0.9)
MONO%: 8.5 % (ref 0.0–14.0)
NEUT#: 3.9 10e3/uL (ref 1.5–6.5)
NEUT%: 64.6 % (ref 38.4–76.8)
Platelets: 260 10e3/uL (ref 145–400)
RBC: 4.86 10e6/uL (ref 3.70–5.45)
RDW: 15.5 % — ABNORMAL HIGH (ref 11.2–14.5)
WBC: 6 10e3/uL (ref 3.9–10.3)
lymph#: 1.3 10e3/uL (ref 0.9–3.3)

## 2014-08-29 LAB — COMPREHENSIVE METABOLIC PANEL (CC13)
ALT: 14 U/L (ref 0–55)
AST: 18 U/L (ref 5–34)
Albumin: 3.6 g/dL (ref 3.5–5.0)
Alkaline Phosphatase: 55 U/L (ref 40–150)
Anion Gap: 10 mEq/L (ref 3–11)
BUN: 22.6 mg/dL (ref 7.0–26.0)
CALCIUM: 9.3 mg/dL (ref 8.4–10.4)
CHLORIDE: 108 meq/L (ref 98–109)
CO2: 26 meq/L (ref 22–29)
Creatinine: 0.8 mg/dL (ref 0.6–1.1)
Glucose: 100 mg/dl (ref 70–140)
Potassium: 4 mEq/L (ref 3.5–5.1)
Sodium: 143 mEq/L (ref 136–145)
Total Bilirubin: 0.32 mg/dL (ref 0.20–1.20)
Total Protein: 7.2 g/dL (ref 6.4–8.3)

## 2014-08-29 MED ORDER — CYANOCOBALAMIN 1000 MCG/ML IJ SOLN
1000.0000 ug | Freq: Once | INTRAMUSCULAR | Status: AC
  Administered 2014-08-29: 1000 ug via INTRAMUSCULAR

## 2014-08-29 NOTE — Telephone Encounter (Signed)
gv and printed appt sched and avs for pt for Sept....sed added tx...emailed MW to add 9.17
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
## 2014-08-29 NOTE — Telephone Encounter (Signed)
gv adn printed appt sched and avs for pt for Sept...sed added tx.

## 2014-08-29 NOTE — Progress Notes (Signed)
   Thoracic Treatment Summary Name:Tammie [name] Date:08/29/2014 DOB:1941/11/22 Your Medical Team Medical Oncologist:[name] Surgeon:Dr. Roxan Hockey Type and Stage of Lung Cancer Non-Small Cell Carcinoma: Adenocarcinoma  Clinical Stage: Stage IIIB staging at cancer conference on 08/29/14 for lung cancer  Lung cancer Cancer   Primary site: Lung   Staging method: AJCC 7th Edition   Clinical: Stage IA (T1a, N0, M0) signed by Curt Bears, MD on 05/29/2014  6:45 PM   Summary: Stage IA (T1a, N0, M0) Breast cancer of upper-outer quadrant of right female breast   Primary site: Breast (Right)   Staging method: AJCC 7th Edition   Clinical: Stage IA (T1c, N0, cM0)   Summary: Stage IA (T1c, N0, cM0)   Clinical comments: Staged at breast conference 11.26.14    Clinical stage is based on radiology exams.  Pathological stage will be determined after surgery.  Staging is based on the size of the tumor, involvement of lymph nodes or not, and whether or not the cancer center has spread. Recommendations Recommendations: Chemotherapy  These recommendations are based on information available as of today's consult.  This is subject to change depending further testing or exams. Next Steps Next Step: Medical Oncology will set up follow up appointments Barriers to Care What do you perceive as a potential barrier that may prevent you from receiving your treatment plan? No barriers identified at this time    Resources Given: [place] [facility] [employer] at [employer].Radonna Ricker [phone]    Questions Norton Blizzard, RN BSN Thoracic Oncology Nurse Navigator at [facility] is a nurse navigator that is available to assist you through your cancer journey.  She can answer your questions and/or provide resources regarding your treatment plan, emotional support, or financial concerns.

## 2014-08-30 ENCOUNTER — Other Ambulatory Visit: Payer: Self-pay | Admitting: *Deleted

## 2014-08-30 ENCOUNTER — Telehealth: Payer: Self-pay | Admitting: *Deleted

## 2014-08-30 ENCOUNTER — Telehealth: Payer: Self-pay | Admitting: Internal Medicine

## 2014-08-30 NOTE — Telephone Encounter (Signed)
s.w. pt and advised on 9.8 appt...pt ok a;dn aware

## 2014-08-30 NOTE — Telephone Encounter (Signed)
Per staff message and POF I have scheduled appts. Advised scheduler of appts. JMW  

## 2014-08-30 NOTE — Telephone Encounter (Signed)
Patietnt called and moved her appts to earlier in the day

## 2014-08-31 MED ORDER — DEXAMETHASONE 4 MG PO TABS: ORAL_TABLET | ORAL | Status: DC

## 2014-08-31 MED ORDER — FOLIC ACID 1 MG PO TABS: 1.0000 mg | ORAL_TABLET | Freq: Every day | ORAL | Status: DC

## 2014-08-31 MED ORDER — PROCHLORPERAZINE MALEATE 10 MG PO TABS: 10.0000 mg | ORAL_TABLET | Freq: Four times a day (QID) | ORAL | Status: DC | PRN

## 2014-09-03 ENCOUNTER — Other Ambulatory Visit: Payer: Medicare HMO

## 2014-09-03 ENCOUNTER — Encounter: Payer: Self-pay | Admitting: *Deleted

## 2014-09-03 ENCOUNTER — Encounter: Payer: Self-pay | Admitting: Internal Medicine

## 2014-09-03 ENCOUNTER — Ambulatory Visit: Payer: Medicare HMO

## 2014-09-03 NOTE — Progress Notes (Signed)
Checked in patient for chemo education. She is early because had to be dropped off. I advised her where to go about 11:50.

## 2014-09-04 ENCOUNTER — Telehealth: Payer: Self-pay | Admitting: Nurse Practitioner

## 2014-09-04 NOTE — Telephone Encounter (Signed)
Made appt for 09/05/14 at 3:10pm at patient request

## 2014-09-05 ENCOUNTER — Ambulatory Visit (INDEPENDENT_AMBULATORY_CARE_PROVIDER_SITE_OTHER): Payer: Medicare HMO | Admitting: Pharmacist

## 2014-09-05 DIAGNOSIS — I4891 Unspecified atrial fibrillation: Secondary | ICD-10-CM

## 2014-09-05 LAB — POCT INR: INR: 2.3

## 2014-09-05 NOTE — Patient Instructions (Signed)
Anticoagulation Dose Instructions as of 09/05/2014     [name] Tue Wed Thu Fri Sat   New Dose 2.5 mg 2.5 mg 2.5 mg 2.5 mg 2.5 mg 2.5 mg 2.5 mg    Description       Continue warfarin 5mg  1/2 tablet daily.       INR was 2.3 today

## 2014-09-10 ENCOUNTER — Ambulatory Visit (INDEPENDENT_AMBULATORY_CARE_PROVIDER_SITE_OTHER): Payer: Medicare HMO | Admitting: *Deleted

## 2014-09-10 DIAGNOSIS — Z23 Encounter for immunization: Secondary | ICD-10-CM

## 2014-09-10 NOTE — Progress Notes (Signed)
Patient ID: Tammie [name], female   DOB: [birth year], 73 y.o.   MRN: [id number] Pt tolerated inj well

## 2014-09-11 ENCOUNTER — Telehealth: Payer: Self-pay | Admitting: *Deleted

## 2014-09-11 NOTE — Telephone Encounter (Signed)
Pt called stating that the person who was going to give her a ride was hospitalized.  She wants to see if she could drive herself.  Informed her that ideally we prefer to have someone drive you your 1st chemo.  But since her ride is no available she can driver herself.  Advised she have someone on call to pick her up if she does have any issues.  She verbalized understanding.

## 2014-09-11 NOTE — Telephone Encounter (Signed)
Patient called regatrding appts for tomorrow. I have forwarded her to the desk RN

## 2014-09-12 ENCOUNTER — Other Ambulatory Visit: Payer: Self-pay | Admitting: Pharmacist Clinician (PhC)/ Clinical Pharmacy Specialist

## 2014-09-12 ENCOUNTER — Other Ambulatory Visit (HOSPITAL_BASED_OUTPATIENT_CLINIC_OR_DEPARTMENT_OTHER): Payer: Medicare HMO

## 2014-09-12 ENCOUNTER — Ambulatory Visit (HOSPITAL_BASED_OUTPATIENT_CLINIC_OR_DEPARTMENT_OTHER): Payer: Medicare HMO

## 2014-09-12 DIAGNOSIS — C3430 Malignant neoplasm of lower lobe, unspecified bronchus or lung: Secondary | ICD-10-CM

## 2014-09-12 DIAGNOSIS — C3410 Malignant neoplasm of upper lobe, unspecified bronchus or lung: Secondary | ICD-10-CM

## 2014-09-12 DIAGNOSIS — Z5111 Encounter for antineoplastic chemotherapy: Secondary | ICD-10-CM

## 2014-09-12 DIAGNOSIS — C3431 Malignant neoplasm of lower lobe, right bronchus or lung: Secondary | ICD-10-CM

## 2014-09-12 LAB — CBC WITH DIFFERENTIAL/PLATELET
BASO%: 0.2 % (ref 0.0–2.0)
BASOS ABS: 0 10*3/uL (ref 0.0–0.1)
EOS%: 0 % (ref 0.0–7.0)
Eosinophils Absolute: 0 10*3/uL (ref 0.0–0.5)
HEMATOCRIT: 40.4 % (ref 34.8–46.6)
HEMOGLOBIN: 13.2 g/dL (ref 11.6–15.9)
LYMPH%: 6.5 % — ABNORMAL LOW (ref 14.0–49.7)
MCH: 26.7 pg (ref 25.1–34.0)
MCHC: 32.6 g/dL (ref 31.5–36.0)
MCV: 81.7 fL (ref 79.5–101.0)
MONO#: 0.5 10*3/uL (ref 0.1–0.9)
MONO%: 5 % (ref 0.0–14.0)
NEUT%: 88.3 % — ABNORMAL HIGH (ref 38.4–76.8)
NEUTROS ABS: 8.8 10*3/uL — AB (ref 1.5–6.5)
Platelets: 248 10*3/uL (ref 145–400)
RBC: 4.94 10*6/uL (ref 3.70–5.45)
RDW: 15.2 % — ABNORMAL HIGH (ref 11.2–14.5)
WBC: 10 10*3/uL (ref 3.9–10.3)
lymph#: 0.6 10*3/uL — ABNORMAL LOW (ref 0.9–3.3)

## 2014-09-12 LAB — COMPREHENSIVE METABOLIC PANEL (CC13)
ALT: 8 U/L (ref 0–55)
AST: 13 U/L (ref 5–34)
Albumin: 3.7 g/dL (ref 3.5–5.0)
Alkaline Phosphatase: 53 U/L (ref 40–150)
Anion Gap: 12 mEq/L — ABNORMAL HIGH (ref 3–11)
BUN: 17.7 mg/dL (ref 7.0–26.0)
CALCIUM: 9.3 mg/dL (ref 8.4–10.4)
CO2: 22 mEq/L (ref 22–29)
CREATININE: 0.7 mg/dL (ref 0.6–1.1)
Chloride: 107 mEq/L (ref 98–109)
Glucose: 148 mg/dl — ABNORMAL HIGH (ref 70–140)
Potassium: 3.8 mEq/L (ref 3.5–5.1)
Sodium: 141 mEq/L (ref 136–145)
Total Bilirubin: 0.46 mg/dL (ref 0.20–1.20)
Total Protein: 7.7 g/dL (ref 6.4–8.3)

## 2014-09-12 LAB — MAGNESIUM (CC13): Magnesium: 2 mg/dl (ref 1.5–2.5)

## 2014-09-12 LAB — PROTIME-INR
INR: 1.72 — ABNORMAL HIGH (ref <1.50)
PROTHROMBIN TIME: 20.2 s — AB (ref 11.6–15.2)

## 2014-09-12 MED ORDER — DEXAMETHASONE SODIUM PHOSPHATE 20 MG/5ML IJ SOLN
INTRAMUSCULAR | Status: AC
  Filled 2014-09-12: qty 5

## 2014-09-12 MED ORDER — PALONOSETRON HCL INJECTION 0.25 MG/5ML
INTRAVENOUS | Status: AC
  Filled 2014-09-12: qty 5

## 2014-09-12 MED ORDER — DEXAMETHASONE SODIUM PHOSPHATE 20 MG/5ML IJ SOLN
12.0000 mg | Freq: Once | INTRAMUSCULAR | Status: AC
  Administered 2014-09-12: 12 mg via INTRAVENOUS

## 2014-09-12 MED ORDER — PEMETREXED DISODIUM CHEMO INJECTION 500 MG
500.0000 mg/m2 | Freq: Once | INTRAVENOUS | Status: AC
  Administered 2014-09-12: 1250 mg via INTRAVENOUS
  Filled 2014-09-12: qty 50

## 2014-09-12 MED ORDER — SODIUM CHLORIDE 0.9 % IV SOLN
75.0000 mg/m2 | Freq: Once | INTRAVENOUS | Status: AC
  Administered 2014-09-12: 187 mg via INTRAVENOUS
  Filled 2014-09-12: qty 187

## 2014-09-12 MED ORDER — SODIUM CHLORIDE 0.9 % IV SOLN
150.0000 mg | Freq: Once | INTRAVENOUS | Status: AC
  Administered 2014-09-12: 150 mg via INTRAVENOUS
  Filled 2014-09-12: qty 5

## 2014-09-12 MED ORDER — SODIUM CHLORIDE 0.9 % IV SOLN
Freq: Once | INTRAVENOUS | Status: AC
  Administered 2014-09-12: 10:00:00 via INTRAVENOUS

## 2014-09-12 MED ORDER — PALONOSETRON HCL INJECTION 0.25 MG/5ML
0.2500 mg | Freq: Once | INTRAVENOUS | Status: AC
  Administered 2014-09-12: 0.25 mg via INTRAVENOUS

## 2014-09-12 MED ORDER — POTASSIUM CHLORIDE 2 MEQ/ML IV SOLN
Freq: Once | INTRAVENOUS | Status: AC
  Administered 2014-09-12: 10:00:00 via INTRAVENOUS
  Filled 2014-09-12: qty 10

## 2014-09-12 NOTE — Patient Instructions (Signed)
[facility] Discharge Instructions for Patients Receiving Chemotherapy  Today you received the following chemotherapy agents alimta/carboplatin.    To help prevent nausea and vomiting after your treatment, we encourage you to take your nausea medication as directed.    If you develop nausea and vomiting that is not controlled by your nausea medication, call the clinic.   BELOW ARE SYMPTOMS THAT SHOULD BE REPORTED IMMEDIATELY:  *FEVER GREATER THAN 100.5 F  *CHILLS WITH OR WITHOUT FEVER  NAUSEA AND VOMITING THAT IS NOT CONTROLLED WITH YOUR NAUSEA MEDICATION  *UNUSUAL SHORTNESS OF BREATH  *UNUSUAL BRUISING OR BLEEDING  TENDERNESS IN MOUTH AND THROAT WITH OR WITHOUT PRESENCE OF ULCERS  *URINARY PROBLEMS  *BOWEL PROBLEMS  UNUSUAL RASH Items with * indicate a potential emergency and should be followed up as soon as possible.  Feel free to call the clinic you have any questions or concerns. The clinic phone number is [phone].  Pemetrexed injection What is this medicine? PEMETREXED (PEM e TREX ed) is a chemotherapy drug. This medicine affects cells that are rapidly growing, such as cancer cells and cells in your mouth and stomach. It is usually used to treat lung cancers like non-small cell lung cancer and mesothelioma. It may also be used to treat other cancers. This medicine may be used for other purposes; ask your health care provider or pharmacist if you have questions. COMMON BRAND NAME(S): Alimta What should I tell my health care provider before I take this medicine? They need to know if you have any of these conditions: -if you frequently drink alcohol containing beverages -infection (especially a virus infection such as chickenpox, cold sores, or herpes) -kidney disease -liver disease -low blood counts, like low platelets, red bloods, or white blood cells -an unusual or allergic reaction to pemetrexed, mannitol, other medicines, foods, dyes, or  preservatives -pregnant or trying to get pregnant -breast-feeding How should I use this medicine? This drug is given as an infusion into a vein. It is administered in a hospital or clinic by a specially trained health care professional. Talk to your pediatrician regarding the use of this medicine in children. Special care may be needed. Overdosage: If you think you have taken too much of this medicine contact a poison control center or emergency room at once. NOTE: This medicine is only for you. Do not share this medicine with others. What if I miss a dose? It is important not to miss your dose. Call your doctor or health care professional if you are unable to keep an appointment. What may interact with this medicine? -aspirin and aspirin-like medicines -medicines to increase blood counts like filgrastim, pegfilgrastim, sargramostim -methotrexate -NSAIDS, medicines for pain and inflammation, like ibuprofen or naproxen -probenecid -pyrimethamine -vaccines Talk to your doctor or health care professional before taking any of these medicines: -acetaminophen -aspirin -ibuprofen -ketoprofen -naproxen This list may not describe all possible interactions. Give your health care provider a list of all the medicines, herbs, non-prescription drugs, or dietary supplements you use. Also tell them if you smoke, drink alcohol, or use illegal drugs. Some items may interact with your medicine. What should I watch for while using this medicine? Visit your doctor for checks on your progress. This drug may make you feel generally unwell. This is not uncommon, as chemotherapy can affect healthy cells as well as cancer cells. Report any side effects. Continue your course of treatment even though you feel ill unless your doctor tells you to stop. In some cases,  you may be given additional medicines to help with side effects. Follow all directions for their use. Call your doctor or health care professional for  advice if you get a fever, chills or sore throat, or other symptoms of a cold or flu. Do not treat yourself. This drug decreases your body's ability to fight infections. Try to avoid being around people who are sick. This medicine may increase your risk to bruise or bleed. Call your doctor or health care professional if you notice any unusual bleeding. Be careful brushing and flossing your teeth or using a toothpick because you may get an infection or bleed more easily. If you have any dental work done, tell your dentist you are receiving this medicine. Avoid taking products that contain aspirin, acetaminophen, ibuprofen, naproxen, or ketoprofen unless instructed by your doctor. These medicines may hide a fever. Call your doctor or health care professional if you get diarrhea or mouth sores. Do not treat yourself. To protect your kidneys, drink water or other fluids as directed while you are taking this medicine. Men and women must use effective birth control while taking this medicine. You may also need to continue using effective birth control for a time after stopping this medicine. Do not become pregnant while taking this medicine. Tell your doctor right away if you think that you or your partner might be pregnant. There is a potential for serious side effects to an unborn child. Talk to your health care professional or pharmacist for more information. Do not breast-feed an infant while taking this medicine. This medicine may lower sperm counts. What side effects may I notice from receiving this medicine? Side effects that you should report to your doctor or health care professional as soon as possible: -allergic reactions like skin rash, itching or hives, swelling of the face, lips, or tongue -low blood counts - this medicine may decrease the number of white blood cells, red blood cells and platelets. You may be at increased risk for infections and bleeding. -signs of infection - fever or chills,  cough, sore throat, pain or difficulty passing urine -signs of decreased platelets or bleeding - bruising, pinpoint red spots on the skin, black, tarry stools, blood in the urine -signs of decreased red blood cells - unusually weak or tired, fainting spells, lightheadedness -breathing problems, like a dry cough -changes in emotions or moods -chest pain -confusion -diarrhea -high blood pressure -mouth or throat sores or ulcers -pain, swelling, warmth in the leg -pain on swallowing -swelling of the ankles, feet, hands -trouble passing urine or change in the amount of urine -vomiting -yellowing of the eyes or skin Side effects that usually do not require medical attention (report to your doctor or health care professional if they continue or are bothersome): -hair loss -loss of appetite -nausea -stomach upset This list may not describe all possible side effects. Call your doctor for medical advice about side effects. You may report side effects to FDA at 1-800-FDA-1088. Where should I keep my medicine? This drug is given in a hospital or clinic and will not be stored at home. NOTE: This sheet is a summary. It may not cover all possible information. If you have questions about this medicine, talk to your doctor, pharmacist, or health care provider.  2015, [employer]. (2008-07-16 13:24:03)  Carboplatin injection What is this medicine? CARBOPLATIN (KAR boe pla tin) is a chemotherapy drug. It targets fast dividing cells, like cancer cells, and causes these cells to die. This medicine is used  to treat ovarian cancer and many other cancers. This medicine may be used for other purposes; ask your health care provider or pharmacist if you have questions. COMMON BRAND NAME(S): Paraplatin What should I tell my health care provider before I take this medicine? They need to know if you have any of these conditions: -blood disorders -hearing problems -kidney disease -recent or  ongoing radiation therapy -an unusual or allergic reaction to carboplatin, cisplatin, other chemotherapy, other medicines, foods, dyes, or preservatives -pregnant or trying to get pregnant -breast-feeding How should I use this medicine? This drug is usually given as an infusion into a vein. It is administered in a hospital or clinic by a specially trained health care professional. Talk to your pediatrician regarding the use of this medicine in children. Special care may be needed. Overdosage: If you think you have taken too much of this medicine contact a poison control center or emergency room at once. NOTE: This medicine is only for you. Do not share this medicine with others. What if I miss a dose? It is important not to miss a dose. Call your doctor or health care professional if you are unable to keep an appointment. What may interact with this medicine? -medicines for seizures -medicines to increase blood counts like filgrastim, pegfilgrastim, sargramostim -some antibiotics like amikacin, gentamicin, neomycin, streptomycin, tobramycin -vaccines Talk to your doctor or health care professional before taking any of these medicines: -acetaminophen -aspirin -ibuprofen -ketoprofen -naproxen This list may not describe all possible interactions. Give your health care provider a list of all the medicines, herbs, non-prescription drugs, or dietary supplements you use. Also tell them if you smoke, drink alcohol, or use illegal drugs. Some items may interact with your medicine. What should I watch for while using this medicine? Your condition will be monitored carefully while you are receiving this medicine. You will need important blood work done while you are taking this medicine. This drug may make you feel generally unwell. This is not uncommon, as chemotherapy can affect healthy cells as well as cancer cells. Report any side effects. Continue your course of treatment even though you feel ill  unless your doctor tells you to stop. In some cases, you may be given additional medicines to help with side effects. Follow all directions for their use. Call your doctor or health care professional for advice if you get a fever, chills or sore throat, or other symptoms of a cold or flu. Do not treat yourself. This drug decreases your body's ability to fight infections. Try to avoid being around people who are sick. This medicine may increase your risk to bruise or bleed. Call your doctor or health care professional if you notice any unusual bleeding. Be careful brushing and flossing your teeth or using a toothpick because you may get an infection or bleed more easily. If you have any dental work done, tell your dentist you are receiving this medicine. Avoid taking products that contain aspirin, acetaminophen, ibuprofen, naproxen, or ketoprofen unless instructed by your doctor. These medicines may hide a fever. Do not become pregnant while taking this medicine. Women should inform their doctor if they wish to become pregnant or think they might be pregnant. There is a potential for serious side effects to an unborn child. Talk to your health care professional or pharmacist for more information. Do not breast-feed an infant while taking this medicine. What side effects may I notice from receiving this medicine? Side effects that you should report to your doctor  or health care professional as soon as possible: -allergic reactions like skin rash, itching or hives, swelling of the face, lips, or tongue -signs of infection - fever or chills, cough, sore throat, pain or difficulty passing urine -signs of decreased platelets or bleeding - bruising, pinpoint red spots on the skin, black, tarry stools, nosebleeds -signs of decreased red blood cells - unusually weak or tired, fainting spells, lightheadedness -breathing problems -changes in hearing -changes in vision -chest pain -high blood pressure -low  blood counts - This drug may decrease the number of white blood cells, red blood cells and platelets. You may be at increased risk for infections and bleeding. -nausea and vomiting -pain, swelling, redness or irritation at the injection site -pain, tingling, numbness in the hands or feet -problems with balance, talking, walking -trouble passing urine or change in the amount of urine Side effects that usually do not require medical attention (report to your doctor or health care professional if they continue or are bothersome): -hair loss -loss of appetite -metallic taste in the mouth or changes in taste This list may not describe all possible side effects. Call your doctor for medical advice about side effects. You may report side effects to FDA at 1-800-FDA-1088. Where should I keep my medicine? This drug is given in a hospital or clinic and will not be stored at home. NOTE: This sheet is a summary. It may not cover all possible information. If you have questions about this medicine, talk to your doctor, pharmacist, or health care provider.  2015, [employer]. (2008-03-19 14:38:05)

## 2014-09-18 ENCOUNTER — Telehealth: Payer: Self-pay | Admitting: *Deleted

## 2014-09-18 NOTE — Telephone Encounter (Signed)
Antiemetic injectable medication pre-certification request given to Dannielle Huh to review in medical mgmt.

## 2014-09-19 ENCOUNTER — Telehealth: Payer: Self-pay | Admitting: Pharmacist

## 2014-09-19 ENCOUNTER — Other Ambulatory Visit: Payer: Self-pay | Admitting: Pharmacist

## 2014-09-19 ENCOUNTER — Ambulatory Visit (HOSPITAL_BASED_OUTPATIENT_CLINIC_OR_DEPARTMENT_OTHER): Payer: Medicare HMO | Admitting: Physician Assistant

## 2014-09-19 ENCOUNTER — Other Ambulatory Visit (HOSPITAL_BASED_OUTPATIENT_CLINIC_OR_DEPARTMENT_OTHER): Payer: Medicare HMO

## 2014-09-19 ENCOUNTER — Encounter: Payer: Self-pay | Admitting: Physician Assistant

## 2014-09-19 ENCOUNTER — Telehealth: Payer: Self-pay | Admitting: Physician Assistant

## 2014-09-19 VITALS — BP 144/74 | HR 65 | Temp 98.0°F | Resp 18 | Ht 71.0 in | Wt 268.6 lb

## 2014-09-19 DIAGNOSIS — C3431 Malignant neoplasm of lower lobe, right bronchus or lung: Secondary | ICD-10-CM

## 2014-09-19 DIAGNOSIS — C3410 Malignant neoplasm of upper lobe, unspecified bronchus or lung: Secondary | ICD-10-CM

## 2014-09-19 DIAGNOSIS — C3430 Malignant neoplasm of lower lobe, unspecified bronchus or lung: Secondary | ICD-10-CM

## 2014-09-19 LAB — CBC WITH DIFFERENTIAL/PLATELET
BASO%: 0.8 % (ref 0.0–2.0)
BASOS ABS: 0.1 10*3/uL (ref 0.0–0.1)
EOS%: 6.1 % (ref 0.0–7.0)
Eosinophils Absolute: 0.4 10*3/uL (ref 0.0–0.5)
HCT: 42.4 % (ref 34.8–46.6)
HEMOGLOBIN: 13.7 g/dL (ref 11.6–15.9)
LYMPH%: 14 % (ref 14.0–49.7)
MCH: 26.2 pg (ref 25.1–34.0)
MCHC: 32.2 g/dL (ref 31.5–36.0)
MCV: 81.4 fL (ref 79.5–101.0)
MONO#: 0.1 10*3/uL (ref 0.1–0.9)
MONO%: 1.5 % (ref 0.0–14.0)
NEUT#: 5.2 10*3/uL (ref 1.5–6.5)
NEUT%: 77.6 % — ABNORMAL HIGH (ref 38.4–76.8)
Platelets: 177 10*3/uL (ref 145–400)
RBC: 5.21 10*6/uL (ref 3.70–5.45)
RDW: 15 % — ABNORMAL HIGH (ref 11.2–14.5)
WBC: 6.7 10*3/uL (ref 3.9–10.3)
lymph#: 0.9 10*3/uL (ref 0.9–3.3)

## 2014-09-19 LAB — COMPREHENSIVE METABOLIC PANEL (CC13)
ALT: 13 U/L (ref 0–55)
ANION GAP: 10 meq/L (ref 3–11)
AST: 14 U/L (ref 5–34)
Albumin: 3.3 g/dL — ABNORMAL LOW (ref 3.5–5.0)
Alkaline Phosphatase: 63 U/L (ref 40–150)
BUN: 30.1 mg/dL — ABNORMAL HIGH (ref 7.0–26.0)
CO2: 28 meq/L (ref 22–29)
CREATININE: 1.1 mg/dL (ref 0.6–1.1)
Calcium: 10.1 mg/dL (ref 8.4–10.4)
Chloride: 103 mEq/L (ref 98–109)
Glucose: 113 mg/dl (ref 70–140)
Potassium: 4 mEq/L (ref 3.5–5.1)
Sodium: 141 mEq/L (ref 136–145)
Total Bilirubin: 0.35 mg/dL (ref 0.20–1.20)
Total Protein: 6.9 g/dL (ref 6.4–8.3)

## 2014-09-19 LAB — POCT INR: INR: 2.04

## 2014-09-19 LAB — MAGNESIUM (CC13): Magnesium: 1.8 mg/dl (ref 1.5–2.5)

## 2014-09-19 NOTE — Telephone Encounter (Signed)
Spoke with Verdis Frederickson at [facility].  She states they can do INR with weekly blood draws but will need an order from clinical pharmacist who is monitoring and adjusting warfarin.  Rx faxed to [phone] Attn:  Verdis Frederickson.
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
## 2014-09-19 NOTE — Telephone Encounter (Signed)
Pt confirmed labs/ov per 09/24 POF, sent msg to add chemo, gave pt AVS......KJ

## 2014-09-23 NOTE — Patient Instructions (Signed)
Decrease your intake of dairy , increase your intake of fiber Continue labs and chemotherapy as scheduled Follow up in 2 weeks

## 2014-09-26 ENCOUNTER — Ambulatory Visit: Payer: Self-pay | Admitting: Pharmacist

## 2014-09-26 ENCOUNTER — Other Ambulatory Visit (INDEPENDENT_AMBULATORY_CARE_PROVIDER_SITE_OTHER): Payer: Medicare HMO | Admitting: Pharmacist

## 2014-09-26 ENCOUNTER — Other Ambulatory Visit (HOSPITAL_BASED_OUTPATIENT_CLINIC_OR_DEPARTMENT_OTHER): Payer: Medicare HMO

## 2014-09-26 DIAGNOSIS — I4891 Unspecified atrial fibrillation: Secondary | ICD-10-CM

## 2014-09-26 DIAGNOSIS — C3431 Malignant neoplasm of lower lobe, right bronchus or lung: Secondary | ICD-10-CM

## 2014-09-26 LAB — COMPREHENSIVE METABOLIC PANEL (CC13)
ALBUMIN: 3.3 g/dL — AB (ref 3.5–5.0)
ALT: 12 U/L (ref 0–55)
ANION GAP: 8 meq/L (ref 3–11)
AST: 14 U/L (ref 5–34)
Alkaline Phosphatase: 62 U/L (ref 40–150)
BUN: 20.4 mg/dL (ref 7.0–26.0)
CALCIUM: 9.2 mg/dL (ref 8.4–10.4)
CHLORIDE: 106 meq/L (ref 98–109)
CO2: 27 mEq/L (ref 22–29)
Creatinine: 0.9 mg/dL (ref 0.6–1.1)
GLUCOSE: 111 mg/dL (ref 70–140)
POTASSIUM: 4.1 meq/L (ref 3.5–5.1)
SODIUM: 141 meq/L (ref 136–145)
TOTAL PROTEIN: 6.6 g/dL (ref 6.4–8.3)
Total Bilirubin: 0.39 mg/dL (ref 0.20–1.20)

## 2014-09-26 LAB — CBC WITH DIFFERENTIAL/PLATELET
BASO%: 0.4 % (ref 0.0–2.0)
Basophils Absolute: 0 10*3/uL (ref 0.0–0.1)
EOS ABS: 0.2 10*3/uL (ref 0.0–0.5)
EOS%: 4.5 % (ref 0.0–7.0)
HCT: 38.8 % (ref 34.8–46.6)
HGB: 12.4 g/dL (ref 11.6–15.9)
LYMPH#: 1 10*3/uL (ref 0.9–3.3)
LYMPH%: 24.4 % (ref 14.0–49.7)
MCH: 26.4 pg (ref 25.1–34.0)
MCHC: 32.1 g/dL (ref 31.5–36.0)
MCV: 82.4 fL (ref 79.5–101.0)
MONO#: 0.4 10*3/uL (ref 0.1–0.9)
MONO%: 9.4 % (ref 0.0–14.0)
NEUT#: 2.4 10*3/uL (ref 1.5–6.5)
NEUT%: 61.3 % (ref 38.4–76.8)
Platelets: 174 10*3/uL (ref 145–400)
RBC: 4.71 10*6/uL (ref 3.70–5.45)
RDW: 15.2 % — ABNORMAL HIGH (ref 11.2–14.5)
WBC: 3.9 10*3/uL (ref 3.9–10.3)

## 2014-09-26 LAB — MAGNESIUM (CC13): MAGNESIUM: 1.9 mg/dL (ref 1.5–2.5)

## 2014-09-26 LAB — POCT INR: INR: 1.9

## 2014-10-02 ENCOUNTER — Telehealth: Payer: Self-pay | Admitting: Nurse Practitioner

## 2014-10-02 ENCOUNTER — Other Ambulatory Visit: Payer: Self-pay | Admitting: *Deleted

## 2014-10-02 MED ORDER — METOPROLOL TARTRATE 100 MG PO TABS: 100.0000 mg | ORAL_TABLET | Freq: Two times a day (BID) | ORAL | Status: DC

## 2014-10-02 NOTE — Telephone Encounter (Signed)
Patient notified that rx sent to [facility]

## 2014-10-03 ENCOUNTER — Ambulatory Visit (INDEPENDENT_AMBULATORY_CARE_PROVIDER_SITE_OTHER): Payer: Medicare HMO | Admitting: Pharmacist

## 2014-10-03 ENCOUNTER — Ambulatory Visit (HOSPITAL_BASED_OUTPATIENT_CLINIC_OR_DEPARTMENT_OTHER): Payer: Medicare HMO

## 2014-10-03 ENCOUNTER — Other Ambulatory Visit (HOSPITAL_BASED_OUTPATIENT_CLINIC_OR_DEPARTMENT_OTHER): Payer: Medicare HMO

## 2014-10-03 ENCOUNTER — Telehealth: Payer: Self-pay | Admitting: Physician Assistant

## 2014-10-03 ENCOUNTER — Ambulatory Visit (HOSPITAL_BASED_OUTPATIENT_CLINIC_OR_DEPARTMENT_OTHER): Payer: Medicare HMO | Admitting: Physician Assistant

## 2014-10-03 VITALS — BP 206/108 | HR 82 | Temp 98.0°F | Resp 19 | Ht 71.0 in | Wt 280.7 lb

## 2014-10-03 VITALS — BP 145/88

## 2014-10-03 DIAGNOSIS — C3412 Malignant neoplasm of upper lobe, left bronchus or lung: Secondary | ICD-10-CM

## 2014-10-03 DIAGNOSIS — I10 Essential (primary) hypertension: Secondary | ICD-10-CM

## 2014-10-03 DIAGNOSIS — Z5111 Encounter for antineoplastic chemotherapy: Secondary | ICD-10-CM

## 2014-10-03 DIAGNOSIS — C50811 Malignant neoplasm of overlapping sites of right female breast: Secondary | ICD-10-CM

## 2014-10-03 DIAGNOSIS — C3411 Malignant neoplasm of upper lobe, right bronchus or lung: Secondary | ICD-10-CM

## 2014-10-03 DIAGNOSIS — C3431 Malignant neoplasm of lower lobe, right bronchus or lung: Secondary | ICD-10-CM

## 2014-10-03 DIAGNOSIS — C3491 Malignant neoplasm of unspecified part of right bronchus or lung: Secondary | ICD-10-CM

## 2014-10-03 LAB — COMPREHENSIVE METABOLIC PANEL (CC13)
ALT: 15 U/L (ref 0–55)
AST: 16 U/L (ref 5–34)
Albumin: 3.4 g/dL — ABNORMAL LOW (ref 3.5–5.0)
Alkaline Phosphatase: 73 U/L (ref 40–150)
Anion Gap: 11 mEq/L (ref 3–11)
BUN: 19.5 mg/dL (ref 7.0–26.0)
CO2: 26 mEq/L (ref 22–29)
CREATININE: 0.9 mg/dL (ref 0.6–1.1)
Calcium: 9.9 mg/dL (ref 8.4–10.4)
Chloride: 104 mEq/L (ref 98–109)
Glucose: 177 mg/dl — ABNORMAL HIGH (ref 70–140)
Potassium: 4.3 mEq/L (ref 3.5–5.1)
Sodium: 141 mEq/L (ref 136–145)
Total Bilirubin: 0.25 mg/dL (ref 0.20–1.20)
Total Protein: 6.9 g/dL (ref 6.4–8.3)

## 2014-10-03 LAB — CBC WITH DIFFERENTIAL/PLATELET
BASO%: 0.4 % (ref 0.0–2.0)
BASOS ABS: 0 10*3/uL (ref 0.0–0.1)
EOS%: 0.2 % (ref 0.0–7.0)
Eosinophils Absolute: 0 10*3/uL (ref 0.0–0.5)
HCT: 37.5 % (ref 34.8–46.6)
HEMOGLOBIN: 12.1 g/dL (ref 11.6–15.9)
LYMPH#: 0.8 10*3/uL — AB (ref 0.9–3.3)
LYMPH%: 17.1 % (ref 14.0–49.7)
MCH: 26.3 pg (ref 25.1–34.0)
MCHC: 32.2 g/dL (ref 31.5–36.0)
MCV: 81.6 fL (ref 79.5–101.0)
MONO#: 0.4 10*3/uL (ref 0.1–0.9)
MONO%: 8.4 % (ref 0.0–14.0)
NEUT#: 3.4 10*3/uL (ref 1.5–6.5)
NEUT%: 73.9 % (ref 38.4–76.8)
Platelets: 381 10*3/uL (ref 145–400)
RBC: 4.6 10*6/uL (ref 3.70–5.45)
RDW: 15.4 % — AB (ref 11.2–14.5)
WBC: 4.6 10*3/uL (ref 3.9–10.3)

## 2014-10-03 LAB — POCT INR: INR: 1.56

## 2014-10-03 LAB — MAGNESIUM (CC13): Magnesium: 1.8 mg/dl (ref 1.5–2.5)

## 2014-10-03 MED ORDER — PALONOSETRON HCL INJECTION 0.25 MG/5ML
INTRAVENOUS | Status: AC
  Filled 2014-10-03: qty 5

## 2014-10-03 MED ORDER — METOPROLOL TARTRATE 100 MG PO TABS: 150.0000 mg | ORAL_TABLET | Freq: Two times a day (BID) | ORAL | Status: DC

## 2014-10-03 MED ORDER — SODIUM CHLORIDE 0.9 % IV SOLN
Freq: Once | INTRAVENOUS | Status: AC
  Administered 2014-10-03: 11:00:00 via INTRAVENOUS

## 2014-10-03 MED ORDER — POTASSIUM CHLORIDE 2 MEQ/ML IV SOLN
Freq: Once | INTRAVENOUS | Status: AC
  Administered 2014-10-03: 11:00:00 via INTRAVENOUS
  Filled 2014-10-03: qty 10

## 2014-10-03 MED ORDER — CLONIDINE HCL 0.1 MG PO TABS
ORAL_TABLET | ORAL | Status: AC
  Filled 2014-10-03: qty 2

## 2014-10-03 MED ORDER — DEXAMETHASONE SODIUM PHOSPHATE 20 MG/5ML IJ SOLN
12.0000 mg | Freq: Once | INTRAMUSCULAR | Status: AC
  Administered 2014-10-03: 12 mg via INTRAVENOUS

## 2014-10-03 MED ORDER — PALONOSETRON HCL INJECTION 0.25 MG/5ML
0.2500 mg | Freq: Once | INTRAVENOUS | Status: AC
  Administered 2014-10-03: 0.25 mg via INTRAVENOUS

## 2014-10-03 MED ORDER — CLONIDINE HCL 0.1 MG PO TABS
0.2000 mg | ORAL_TABLET | Freq: Once | ORAL | Status: AC
  Administered 2014-10-03: 0.2 mg via ORAL

## 2014-10-03 MED ORDER — SODIUM CHLORIDE 0.9 % IV SOLN
150.0000 mg | Freq: Once | INTRAVENOUS | Status: AC
  Administered 2014-10-03: 150 mg via INTRAVENOUS
  Filled 2014-10-03: qty 5

## 2014-10-03 MED ORDER — DEXAMETHASONE SODIUM PHOSPHATE 20 MG/5ML IJ SOLN
INTRAMUSCULAR | Status: AC
  Filled 2014-10-03: qty 5

## 2014-10-03 MED ORDER — SODIUM CHLORIDE 0.9 % IV SOLN
75.0000 mg/m2 | Freq: Once | INTRAVENOUS | Status: AC
  Administered 2014-10-03: 187 mg via INTRAVENOUS
  Filled 2014-10-03: qty 187

## 2014-10-03 MED ORDER — SODIUM CHLORIDE 0.9 % IV SOLN
500.0000 mg/m2 | Freq: Once | INTRAVENOUS | Status: AC
  Administered 2014-10-03: 1250 mg via INTRAVENOUS
  Filled 2014-10-03: qty 50

## 2014-10-03 NOTE — Telephone Encounter (Signed)
Pt recd AVS no POF available at the time..... Tammie [name]
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
## 2014-10-03 NOTE — Progress Notes (Signed)
I called patient regarding new warfarin dose - she voiced understanding and recalled dose back to me.  She also mentioned that her BP was elevated at the cancer center today and someone from their office was suppose to call our office about increasing metoprolol dose.  Patient's BP was 206/108 and pulse was 82.  Increase metoprolol 100mg  to 1.5 tablet bid - rx sent to pharmacy.

## 2014-10-03 NOTE — Patient Instructions (Signed)
[facility] Discharge Instructions for Patients Receiving Chemotherapy  Today you received the following chemotherapy agents Cisplatin and Alimta  To help prevent nausea and vomiting after your treatment, we encourage you to take your nausea medication  Zofran as directed   If you develop nausea and vomiting that is not controlled by your nausea medication, call the clinic.   BELOW ARE SYMPTOMS THAT SHOULD BE REPORTED IMMEDIATELY:  *FEVER GREATER THAN 100.5 F  *CHILLS WITH OR WITHOUT FEVER  NAUSEA AND VOMITING THAT IS NOT CONTROLLED WITH YOUR NAUSEA MEDICATION  *UNUSUAL SHORTNESS OF BREATH  *UNUSUAL BRUISING OR BLEEDING  TENDERNESS IN MOUTH AND THROAT WITH OR WITHOUT PRESENCE OF ULCERS  *URINARY PROBLEMS  *BOWEL PROBLEMS  UNUSUAL RASH Items with * indicate a potential emergency and should be followed up as soon as possible.  Feel free to call the clinic you have any questions or concerns. The clinic phone number is [phone].

## 2014-10-04 ENCOUNTER — Telehealth: Payer: Self-pay | Admitting: Internal Medicine

## 2014-10-04 NOTE — Telephone Encounter (Signed)
s.w. pt and advised on extended sched.Marland KitchenMarland Kitchenpt will pick up new sched nxt week

## 2014-10-05 NOTE — Patient Instructions (Signed)
Closely monitor your blood pressure and if it remains elevated contact her primary care provider regarding adjusting your blood pressure medication Continue weekly labs as scheduled Followup in 3 weeks prior to next scheduled cycle of chemotherapy

## 2014-10-10 ENCOUNTER — Other Ambulatory Visit: Payer: Self-pay | Admitting: *Deleted

## 2014-10-10 ENCOUNTER — Other Ambulatory Visit: Payer: Self-pay | Admitting: Medical Oncology

## 2014-10-10 ENCOUNTER — Telehealth: Payer: Self-pay | Admitting: Medical Oncology

## 2014-10-10 ENCOUNTER — Other Ambulatory Visit (HOSPITAL_BASED_OUTPATIENT_CLINIC_OR_DEPARTMENT_OTHER): Payer: Medicare HMO

## 2014-10-10 ENCOUNTER — Other Ambulatory Visit: Payer: Self-pay | Admitting: Pharmacist

## 2014-10-10 DIAGNOSIS — C3431 Malignant neoplasm of lower lobe, right bronchus or lung: Secondary | ICD-10-CM

## 2014-10-10 DIAGNOSIS — C3412 Malignant neoplasm of upper lobe, left bronchus or lung: Secondary | ICD-10-CM

## 2014-10-10 DIAGNOSIS — C3491 Malignant neoplasm of unspecified part of right bronchus or lung: Secondary | ICD-10-CM

## 2014-10-10 DIAGNOSIS — C50811 Malignant neoplasm of overlapping sites of right female breast: Secondary | ICD-10-CM

## 2014-10-10 DIAGNOSIS — C3411 Malignant neoplasm of upper lobe, right bronchus or lung: Secondary | ICD-10-CM

## 2014-10-10 LAB — CBC WITH DIFFERENTIAL/PLATELET
BASO%: 0.7 % (ref 0.0–2.0)
Basophils Absolute: 0 10*3/uL (ref 0.0–0.1)
EOS ABS: 0.1 10*3/uL (ref 0.0–0.5)
EOS%: 1.9 % (ref 0.0–7.0)
HCT: 42.1 % (ref 34.8–46.6)
HGB: 13.5 g/dL (ref 11.6–15.9)
LYMPH%: 21.4 % (ref 14.0–49.7)
MCH: 26.2 pg (ref 25.1–34.0)
MCHC: 32.2 g/dL (ref 31.5–36.0)
MCV: 81.3 fL (ref 79.5–101.0)
MONO#: 0 10*3/uL — ABNORMAL LOW (ref 0.1–0.9)
MONO%: 1.2 % (ref 0.0–14.0)
NEUT#: 3 10*3/uL (ref 1.5–6.5)
NEUT%: 74.8 % (ref 38.4–76.8)
PLATELETS: 161 10*3/uL (ref 145–400)
RBC: 5.17 10*6/uL (ref 3.70–5.45)
RDW: 15.7 % — ABNORMAL HIGH (ref 11.2–14.5)
WBC: 4 10*3/uL (ref 3.9–10.3)
lymph#: 0.8 10*3/uL — ABNORMAL LOW (ref 0.9–3.3)

## 2014-10-10 LAB — PROTIME-INR
INR: 2.14 — ABNORMAL HIGH (ref <1.50)
Prothrombin Time: 23.9 seconds — ABNORMAL HIGH (ref 11.6–15.2)

## 2014-10-10 LAB — COMPREHENSIVE METABOLIC PANEL (CC13)
ALT: 22 U/L (ref 0–55)
ANION GAP: 11 meq/L (ref 3–11)
AST: 21 U/L (ref 5–34)
Albumin: 3.5 g/dL (ref 3.5–5.0)
Alkaline Phosphatase: 61 U/L (ref 40–150)
BUN: 68.4 mg/dL — AB (ref 7.0–26.0)
CO2: 28 meq/L (ref 22–29)
CREATININE: 2.3 mg/dL — AB (ref 0.6–1.1)
Calcium: 10.8 mg/dL — ABNORMAL HIGH (ref 8.4–10.4)
Chloride: 99 mEq/L (ref 98–109)
Glucose: 106 mg/dl (ref 70–140)
Potassium: 4.9 mEq/L (ref 3.5–5.1)
SODIUM: 138 meq/L (ref 136–145)
TOTAL PROTEIN: 6.8 g/dL (ref 6.4–8.3)
Total Bilirubin: 0.59 mg/dL (ref 0.20–1.20)

## 2014-10-10 LAB — MAGNESIUM (CC13): Magnesium: 2 mg/dl (ref 1.5–2.5)

## 2014-10-10 MED ORDER — PROCHLORPERAZINE MALEATE 10 MG PO TABS: 10.0000 mg | ORAL_TABLET | Freq: Four times a day (QID) | ORAL | Status: DC | PRN

## 2014-10-10 NOTE — Telephone Encounter (Signed)
Message copied by Ardeen Garland on Thu Oct 10, 2014  3:39 PM ------      Message from: Curt Bears      Created: Thu Oct 10, 2014  3:36 PM       Call patient with the result and arrange for IVF with 1 litre NS tomorrow. Encourage PO intake ------

## 2014-10-10 NOTE — Telephone Encounter (Signed)
I LEFT MESSAGE FOR PT TO CALL ME BACK TO SCHEDULE ivf TOMORROW.

## 2014-10-10 NOTE — Progress Notes (Signed)
Quick Note:  Call patient with the result and arrange for IVF with 1 litre NS tomorrow. Encourage PO intake ______

## 2014-10-11 ENCOUNTER — Other Ambulatory Visit: Payer: Self-pay | Admitting: Medical Oncology

## 2014-10-11 ENCOUNTER — Ambulatory Visit (INDEPENDENT_AMBULATORY_CARE_PROVIDER_SITE_OTHER): Payer: Medicare HMO | Admitting: Pharmacist

## 2014-10-11 ENCOUNTER — Ambulatory Visit (HOSPITAL_BASED_OUTPATIENT_CLINIC_OR_DEPARTMENT_OTHER): Payer: Medicare HMO

## 2014-10-11 ENCOUNTER — Telehealth: Payer: Self-pay | Admitting: Internal Medicine

## 2014-10-11 ENCOUNTER — Telehealth: Payer: Self-pay | Admitting: *Deleted

## 2014-10-11 VITALS — BP 142/54 | HR 70 | Temp 97.5°F | Resp 18

## 2014-10-11 DIAGNOSIS — C3412 Malignant neoplasm of upper lobe, left bronchus or lung: Secondary | ICD-10-CM

## 2014-10-11 DIAGNOSIS — E860 Dehydration: Secondary | ICD-10-CM

## 2014-10-11 DIAGNOSIS — R7989 Other specified abnormal findings of blood chemistry: Secondary | ICD-10-CM

## 2014-10-11 DIAGNOSIS — C50811 Malignant neoplasm of overlapping sites of right female breast: Secondary | ICD-10-CM

## 2014-10-11 DIAGNOSIS — C3411 Malignant neoplasm of upper lobe, right bronchus or lung: Secondary | ICD-10-CM

## 2014-10-11 MED ORDER — SODIUM CHLORIDE 0.9 % IV SOLN
1000.0000 mL | INTRAVENOUS | Status: DC
  Administered 2014-10-11: 1000 mL via INTRAVENOUS

## 2014-10-11 NOTE — Telephone Encounter (Signed)
S/w pt confirming treatment for 10/16 per 10/15 POF..... Tammie [name]

## 2014-10-11 NOTE — Telephone Encounter (Signed)
Per staff message and POF I have scheduled appts. Advised scheduler of appts. JMW  

## 2014-10-11 NOTE — Patient Instructions (Signed)
Dehydration, Adult Dehydration is when you lose more fluids from the body than you take in. Vital organs like the kidneys, brain, and heart cannot function without a proper amount of fluids and salt. Any loss of fluids from the body can cause dehydration.  CAUSES   Vomiting.  Diarrhea.  Excessive sweating.  Excessive urine output.  Fever. SYMPTOMS  Mild dehydration  Thirst.  Dry lips.  Slightly dry mouth. Moderate dehydration  Very dry mouth.  Sunken eyes.  Skin does not bounce back quickly when lightly pinched and released.  Dark urine and decreased urine production.  Decreased tear production.  Headache. Severe dehydration  Very dry mouth.  Extreme thirst.  Rapid, weak pulse (more than 100 beats per minute at rest).  Cold hands and feet.  Not able to sweat in spite of heat and temperature.  Rapid breathing.  Blue lips.  Confusion and lethargy.  Difficulty being awakened.  Minimal urine production.  No tears. DIAGNOSIS  Your caregiver will diagnose dehydration based on your symptoms and your exam. Blood and urine tests will help confirm the diagnosis. The diagnostic evaluation should also identify the cause of dehydration. TREATMENT  Treatment of mild or moderate dehydration can often be done at home by increasing the amount of fluids that you drink. It is best to drink small amounts of fluid more often. Drinking too much at one time can make vomiting worse. Refer to the home care instructions below. Severe dehydration needs to be treated at the hospital where you will probably be given intravenous (IV) fluids that contain water and electrolytes. HOME CARE INSTRUCTIONS   Ask your caregiver about specific rehydration instructions.  Drink enough fluids to keep your urine clear or pale yellow.  Drink small amounts frequently if you have nausea and vomiting.  Eat as you normally do.  Avoid:  Foods or drinks high in sugar.  Carbonated  drinks.  Juice.  Extremely hot or cold fluids.  Drinks with caffeine.  Fatty, greasy foods.  Alcohol.  Tobacco.  Overeating.  Gelatin desserts.  Wash your hands well to avoid spreading bacteria and viruses.  Only take over-the-counter or prescription medicines for pain, discomfort, or fever as directed by your caregiver.  Ask your caregiver if you should continue all prescribed and over-the-counter medicines.  Keep all follow-up appointments with your caregiver. SEEK MEDICAL CARE IF:  You have abdominal pain and it increases or stays in one area (localizes).  You have a rash, stiff neck, or severe headache.  You are irritable, sleepy, or difficult to awaken.  You are weak, dizzy, or extremely thirsty. SEEK IMMEDIATE MEDICAL CARE IF:   You are unable to keep fluids down or you get worse despite treatment.  You have frequent episodes of vomiting or diarrhea.  You have blood or green matter (bile) in your vomit.  You have blood in your stool or your stool looks black and tarry.  You have not urinated in 6 to 8 hours, or you have only urinated a small amount of very dark urine.  You have a fever.  You faint. MAKE SURE YOU:   Understand these instructions.  Will watch your condition.  Will get help right away if you are not doing well or get worse. Document Released: 12/13/2005 Document Revised: 03/06/2012 Document Reviewed: 08/02/2011 Exit[employer] Patient Information 2015 [employer]. This information is not intended to replace advice given to you by your health care provider. Make sure you discuss any questions you have with your health care   provider.  

## 2014-10-17 ENCOUNTER — Other Ambulatory Visit: Payer: Self-pay | Admitting: Pharmacist

## 2014-10-17 ENCOUNTER — Ambulatory Visit (HOSPITAL_BASED_OUTPATIENT_CLINIC_OR_DEPARTMENT_OTHER): Payer: Medicare HMO | Admitting: Nurse Practitioner

## 2014-10-17 ENCOUNTER — Encounter: Payer: Self-pay | Admitting: Nurse Practitioner

## 2014-10-17 ENCOUNTER — Other Ambulatory Visit (HOSPITAL_BASED_OUTPATIENT_CLINIC_OR_DEPARTMENT_OTHER): Payer: Medicare HMO

## 2014-10-17 ENCOUNTER — Telehealth: Payer: Self-pay | Admitting: Internal Medicine

## 2014-10-17 VITALS — BP 167/80 | HR 78 | Temp 97.9°F | Resp 18 | Ht 71.0 in | Wt 275.3 lb

## 2014-10-17 DIAGNOSIS — C3411 Malignant neoplasm of upper lobe, right bronchus or lung: Secondary | ICD-10-CM

## 2014-10-17 DIAGNOSIS — C3412 Malignant neoplasm of upper lobe, left bronchus or lung: Secondary | ICD-10-CM

## 2014-10-17 DIAGNOSIS — B029 Zoster without complications: Secondary | ICD-10-CM | POA: Insufficient documentation

## 2014-10-17 DIAGNOSIS — C3431 Malignant neoplasm of lower lobe, right bronchus or lung: Secondary | ICD-10-CM

## 2014-10-17 LAB — CBC WITH DIFFERENTIAL/PLATELET
BASO%: 0.2 % (ref 0.0–2.0)
Basophils Absolute: 0 10*3/uL (ref 0.0–0.1)
EOS%: 2.1 % (ref 0.0–7.0)
Eosinophils Absolute: 0 10*3/uL (ref 0.0–0.5)
HCT: 35.2 % (ref 34.8–46.6)
HGB: 11.2 g/dL — ABNORMAL LOW (ref 11.6–15.9)
LYMPH%: 28 % (ref 14.0–49.7)
MCH: 26.2 pg (ref 25.1–34.0)
MCHC: 31.9 g/dL (ref 31.5–36.0)
MCV: 82.1 fL (ref 79.5–101.0)
MONO#: 0.2 10*3/uL (ref 0.1–0.9)
MONO%: 9.2 % (ref 0.0–14.0)
NEUT#: 1.1 10*3/uL — ABNORMAL LOW (ref 1.5–6.5)
NEUT%: 60.5 % (ref 38.4–76.8)
PLATELETS: 115 10*3/uL — AB (ref 145–400)
RBC: 4.29 10*6/uL (ref 3.70–5.45)
RDW: 14.9 % — AB (ref 11.2–14.5)
WBC: 1.8 10*3/uL — ABNORMAL LOW (ref 3.9–10.3)
lymph#: 0.5 10*3/uL — ABNORMAL LOW (ref 0.9–3.3)

## 2014-10-17 LAB — COMPREHENSIVE METABOLIC PANEL (CC13)
ALK PHOS: 62 U/L (ref 40–150)
ALT: 20 U/L (ref 0–55)
AST: 18 U/L (ref 5–34)
Albumin: 3.4 g/dL — ABNORMAL LOW (ref 3.5–5.0)
Anion Gap: 11 mEq/L (ref 3–11)
BUN: 41.7 mg/dL — ABNORMAL HIGH (ref 7.0–26.0)
CO2: 28 meq/L (ref 22–29)
Calcium: 9.6 mg/dL (ref 8.4–10.4)
Chloride: 104 mEq/L (ref 98–109)
Creatinine: 1.8 mg/dL — ABNORMAL HIGH (ref 0.6–1.1)
Glucose: 139 mg/dl (ref 70–140)
POTASSIUM: 4.3 meq/L (ref 3.5–5.1)
Sodium: 143 mEq/L (ref 136–145)
TOTAL PROTEIN: 6.4 g/dL (ref 6.4–8.3)
Total Bilirubin: 0.29 mg/dL (ref 0.20–1.20)

## 2014-10-17 LAB — MAGNESIUM (CC13): Magnesium: 2.2 mg/dl (ref 1.5–2.5)

## 2014-10-17 MED ORDER — VALACYCLOVIR HCL 1 G PO TABS: 1000.0000 mg | ORAL_TABLET | Freq: Three times a day (TID) | ORAL | Status: DC

## 2014-10-17 MED ORDER — OXYCODONE HCL 5 MG PO TABS: 5.0000 mg | ORAL_TABLET | ORAL | Status: DC | PRN
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
## 2014-10-17 NOTE — Telephone Encounter (Signed)
pt came in with headache and red blotches on face...per CB ok to sched ..done sent to registration

## 2014-10-17 NOTE — Assessment & Plan Note (Signed)
Patient received cycle 2 of her cisplatin/Alimta chemotherapy on 10/03/2014.  She is scheduled to receive cycle 3 of the same regimen on 10/24/2014.  Since patient is actively being treated for shingles-advised patient to return to the [facility] on 10/24/2014 as previously planned; but all will be dependent on how well she is recovering from shingles in regards to her planned chemotherapy.

## 2014-10-17 NOTE — Assessment & Plan Note (Signed)
It does appear the patient has developed shingles to her left head and face.  Patient will be initiated on valacyclovir at 1 g 3 times per day for a total of 7 days.  (Patient's creatinine today was 1.8; and creatinine clearance was calculated at 54.93).  Advised patient to followup with a local ophthalmologist if she has any vision changes or other eye issues.  Patient requested and was given a refill of her oxycodone today as well.

## 2014-10-18 ENCOUNTER — Telehealth: Payer: Self-pay | Admitting: *Deleted

## 2014-10-18 ENCOUNTER — Ambulatory Visit (INDEPENDENT_AMBULATORY_CARE_PROVIDER_SITE_OTHER): Payer: Medicare HMO | Admitting: Pharmacist

## 2014-10-18 LAB — PROTIME-INR
INR: 1.56 — ABNORMAL HIGH (ref <1.50)
Prothrombin Time: 18.7 seconds — ABNORMAL HIGH (ref 11.6–15.2)

## 2014-10-18 NOTE — Telephone Encounter (Signed)
Called to check on how pt's eye was doing.  Pt was evaluated yesterday in symptom mgmt clinic and has shingles around her eye.  Pt states that the pain is about the same and her eyebrow is now swollen.  No additional swelling or changes right at the eye.  Per Dr Vista Mink, continue to take pain medication and valtrex as prescribed.  Pt needs to see an opthamologist if it does start to spread closer to her eye.  Pt verbalized understanding.

## 2014-10-24 ENCOUNTER — Ambulatory Visit: Payer: Medicare HMO

## 2014-10-24 ENCOUNTER — Ambulatory Visit (INDEPENDENT_AMBULATORY_CARE_PROVIDER_SITE_OTHER): Payer: Medicare HMO | Admitting: Pharmacist

## 2014-10-24 ENCOUNTER — Encounter: Payer: Self-pay | Admitting: Physician Assistant

## 2014-10-24 ENCOUNTER — Ambulatory Visit (HOSPITAL_BASED_OUTPATIENT_CLINIC_OR_DEPARTMENT_OTHER): Payer: Medicare HMO | Admitting: Physician Assistant

## 2014-10-24 ENCOUNTER — Other Ambulatory Visit (HOSPITAL_BASED_OUTPATIENT_CLINIC_OR_DEPARTMENT_OTHER): Payer: Medicare HMO

## 2014-10-24 VITALS — BP 184/85 | HR 78 | Temp 97.5°F | Resp 20 | Ht 71.0 in | Wt 276.1 lb

## 2014-10-24 DIAGNOSIS — B0229 Other postherpetic nervous system involvement: Secondary | ICD-10-CM

## 2014-10-24 DIAGNOSIS — C3431 Malignant neoplasm of lower lobe, right bronchus or lung: Secondary | ICD-10-CM

## 2014-10-24 DIAGNOSIS — Z85118 Personal history of other malignant neoplasm of bronchus and lung: Secondary | ICD-10-CM

## 2014-10-24 DIAGNOSIS — B0239 Other herpes zoster eye disease: Secondary | ICD-10-CM

## 2014-10-24 DIAGNOSIS — C3411 Malignant neoplasm of upper lobe, right bronchus or lung: Secondary | ICD-10-CM

## 2014-10-24 DIAGNOSIS — C50811 Malignant neoplasm of overlapping sites of right female breast: Secondary | ICD-10-CM

## 2014-10-24 DIAGNOSIS — I10 Essential (primary) hypertension: Secondary | ICD-10-CM

## 2014-10-24 LAB — CBC WITH DIFFERENTIAL/PLATELET
BASO%: 0.2 % (ref 0.0–2.0)
Basophils Absolute: 0 10*3/uL (ref 0.0–0.1)
EOS%: 0.2 % (ref 0.0–7.0)
Eosinophils Absolute: 0 10*3/uL (ref 0.0–0.5)
HCT: 32.7 % — ABNORMAL LOW (ref 34.8–46.6)
HGB: 10.6 g/dL — ABNORMAL LOW (ref 11.6–15.9)
LYMPH%: 17.3 % (ref 14.0–49.7)
MCH: 26.7 pg (ref 25.1–34.0)
MCHC: 32.4 g/dL (ref 31.5–36.0)
MCV: 82.4 fL (ref 79.5–101.0)
MONO#: 0.7 10*3/uL (ref 0.1–0.9)
MONO%: 11.7 % (ref 0.0–14.0)
NEUT#: 4.2 10*3/uL (ref 1.5–6.5)
NEUT%: 70.6 % (ref 38.4–76.8)
PLATELETS: 317 10*3/uL (ref 145–400)
RBC: 3.97 10*6/uL (ref 3.70–5.45)
RDW: 15.8 % — ABNORMAL HIGH (ref 11.2–14.5)
WBC: 5.9 10*3/uL (ref 3.9–10.3)
lymph#: 1 10*3/uL (ref 0.9–3.3)

## 2014-10-24 LAB — COMPREHENSIVE METABOLIC PANEL (CC13)
ALT: 16 U/L (ref 0–55)
ANION GAP: 9 meq/L (ref 3–11)
AST: 21 U/L (ref 5–34)
Albumin: 3.6 g/dL (ref 3.5–5.0)
Alkaline Phosphatase: 61 U/L (ref 40–150)
BILIRUBIN TOTAL: 0.27 mg/dL (ref 0.20–1.20)
BUN: 26.6 mg/dL — ABNORMAL HIGH (ref 7.0–26.0)
CO2: 24 mEq/L (ref 22–29)
CREATININE: 1.5 mg/dL — AB (ref 0.6–1.1)
Calcium: 10 mg/dL (ref 8.4–10.4)
Chloride: 104 mEq/L (ref 98–109)
Glucose: 146 mg/dl — ABNORMAL HIGH (ref 70–140)
Potassium: 5.4 mEq/L — ABNORMAL HIGH (ref 3.5–5.1)
Sodium: 138 mEq/L (ref 136–145)
Total Protein: 6.9 g/dL (ref 6.4–8.3)

## 2014-10-24 LAB — POCT INR: INR: 1.66

## 2014-10-24 LAB — MAGNESIUM (CC13): Magnesium: 2.7 mg/dl — ABNORMAL HIGH (ref 1.5–2.5)

## 2014-10-24 LAB — TECHNOLOGIST REVIEW
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
## 2014-10-25 ENCOUNTER — Other Ambulatory Visit: Payer: Self-pay | Admitting: *Deleted

## 2014-10-25 ENCOUNTER — Telehealth: Payer: Self-pay | Admitting: Internal Medicine

## 2014-10-25 ENCOUNTER — Telehealth: Payer: Self-pay | Admitting: *Deleted

## 2014-10-25 ENCOUNTER — Other Ambulatory Visit: Payer: Self-pay | Admitting: Medical Oncology

## 2014-10-25 DIAGNOSIS — B029 Zoster without complications: Secondary | ICD-10-CM

## 2014-10-25 MED ORDER — VALACYCLOVIR HCL 500 MG PO TABS: 500.0000 mg | ORAL_TABLET | Freq: Three times a day (TID) | ORAL | Status: DC

## 2014-10-25 NOTE — Telephone Encounter (Signed)
Per staff message and POF I have scheduled appts. Advised scheduler of appts. JMW  

## 2014-10-25 NOTE — Telephone Encounter (Signed)
Called and informed patient that a prescription for Valtrex has been sent to her pharmacy.  Per [name], PA. Patient verbalized understanding.

## 2014-10-25 NOTE — Telephone Encounter (Signed)
lvm for pt regarding time of appt changed.Marland KitchenMarland KitchenMarland Kitchen

## 2014-10-26 NOTE — Patient Instructions (Signed)
Your rescheduling your chemotherapy by one week to allow an additional week for your shingles to heal more. Take an additional week of Valtrex at 500 mg by mouth 3 times daily Continue weekly labs as scheduled Return again in 4 weeks prior to cycle #4 of your adjuvant chemotherapy.

## 2014-10-28 ENCOUNTER — Encounter: Payer: Self-pay | Admitting: Physician Assistant

## 2014-10-31 ENCOUNTER — Other Ambulatory Visit: Payer: Medicare HMO

## 2014-10-31 ENCOUNTER — Other Ambulatory Visit (HOSPITAL_BASED_OUTPATIENT_CLINIC_OR_DEPARTMENT_OTHER): Payer: Medicare HMO

## 2014-10-31 ENCOUNTER — Other Ambulatory Visit: Payer: Self-pay | Admitting: Internal Medicine

## 2014-10-31 ENCOUNTER — Other Ambulatory Visit: Payer: Self-pay | Admitting: Pharmacist

## 2014-10-31 ENCOUNTER — Ambulatory Visit (INDEPENDENT_AMBULATORY_CARE_PROVIDER_SITE_OTHER): Payer: Medicare HMO | Admitting: Pharmacist

## 2014-10-31 ENCOUNTER — Ambulatory Visit (HOSPITAL_BASED_OUTPATIENT_CLINIC_OR_DEPARTMENT_OTHER): Payer: Medicare HMO

## 2014-10-31 DIAGNOSIS — C3431 Malignant neoplasm of lower lobe, right bronchus or lung: Secondary | ICD-10-CM

## 2014-10-31 DIAGNOSIS — Z5111 Encounter for antineoplastic chemotherapy: Secondary | ICD-10-CM

## 2014-10-31 LAB — COMPREHENSIVE METABOLIC PANEL (CC13)
ALK PHOS: 59 U/L (ref 40–150)
ALT: 16 U/L (ref 0–55)
AST: 19 U/L (ref 5–34)
Albumin: 3.6 g/dL (ref 3.5–5.0)
Anion Gap: 12 mEq/L — ABNORMAL HIGH (ref 3–11)
BILIRUBIN TOTAL: 0.31 mg/dL (ref 0.20–1.20)
BUN: 36.7 mg/dL — AB (ref 7.0–26.0)
CO2: 25 mEq/L (ref 22–29)
CREATININE: 1.4 mg/dL — AB (ref 0.6–1.1)
Calcium: 10.7 mg/dL — ABNORMAL HIGH (ref 8.4–10.4)
Chloride: 102 mEq/L (ref 98–109)
GLUCOSE: 151 mg/dL — AB (ref 70–140)
Potassium: 5 mEq/L (ref 3.5–5.1)
Sodium: 138 mEq/L (ref 136–145)
Total Protein: 6.8 g/dL (ref 6.4–8.3)

## 2014-10-31 LAB — PROTIME-INR
INR: 1.62 — ABNORMAL HIGH (ref <1.50)
PROTHROMBIN TIME: 19.2 s — AB (ref 11.6–15.2)

## 2014-10-31 LAB — CBC WITH DIFFERENTIAL/PLATELET
BASO%: 0.4 % (ref 0.0–2.0)
Basophils Absolute: 0.1 10e3/uL (ref 0.0–0.1)
EOS%: 0 % (ref 0.0–7.0)
Eosinophils Absolute: 0 10e3/uL (ref 0.0–0.5)
HCT: 33.8 % — ABNORMAL LOW (ref 34.8–46.6)
HGB: 10.9 g/dL — ABNORMAL LOW (ref 11.6–15.9)
LYMPH%: 7.2 % — ABNORMAL LOW (ref 14.0–49.7)
MCH: 26.7 pg (ref 25.1–34.0)
MCHC: 32.3 g/dL (ref 31.5–36.0)
MCV: 82.7 fL (ref 79.5–101.0)
MONO#: 0.6 10e3/uL (ref 0.1–0.9)
MONO%: 5.1 % (ref 0.0–14.0)
NEUT#: 10.8 10e3/uL — ABNORMAL HIGH (ref 1.5–6.5)
NEUT%: 87.3 % — ABNORMAL HIGH (ref 38.4–76.8)
Platelets: 202 10e3/uL (ref 145–400)
RBC: 4.09 10e6/uL (ref 3.70–5.45)
RDW: 15.8 % — ABNORMAL HIGH (ref 11.2–14.5)
WBC: 12.4 10e3/uL — ABNORMAL HIGH (ref 3.9–10.3)
lymph#: 0.9 10e3/uL (ref 0.9–3.3)

## 2014-10-31 LAB — MAGNESIUM (CC13): MAGNESIUM: 2.3 mg/dL (ref 1.5–2.5)

## 2014-10-31 MED ORDER — POTASSIUM CHLORIDE 2 MEQ/ML IV SOLN
Freq: Once | INTRAVENOUS | Status: AC
  Administered 2014-10-31: 10:00:00 via INTRAVENOUS
  Filled 2014-10-31: qty 10

## 2014-10-31 MED ORDER — SODIUM CHLORIDE 0.9 % IV SOLN
500.0000 mg/m2 | Freq: Once | INTRAVENOUS | Status: AC
  Administered 2014-10-31: 1250 mg via INTRAVENOUS
  Filled 2014-10-31: qty 50

## 2014-10-31 MED ORDER — CYANOCOBALAMIN 1000 MCG/ML IJ SOLN
1000.0000 ug | Freq: Once | INTRAMUSCULAR | Status: AC
  Administered 2014-10-31: 1000 ug via INTRAMUSCULAR

## 2014-10-31 MED ORDER — CYANOCOBALAMIN 1000 MCG/ML IJ SOLN
INTRAMUSCULAR | Status: AC
  Filled 2014-10-31: qty 1

## 2014-10-31 MED ORDER — SODIUM CHLORIDE 0.9 % IV SOLN: 75.0000 mg/m2 | Freq: Once | INTRAVENOUS | Status: DC

## 2014-10-31 MED ORDER — SODIUM CHLORIDE 0.9 % IV SOLN
60.0000 mg/m2 | Freq: Once | INTRAVENOUS | Status: AC
  Administered 2014-10-31: 150 mg via INTRAVENOUS
  Filled 2014-10-31: qty 150

## 2014-10-31 MED ORDER — DEXAMETHASONE SODIUM PHOSPHATE 20 MG/5ML IJ SOLN
12.0000 mg | Freq: Once | INTRAMUSCULAR | Status: AC
  Administered 2014-10-31: 12 mg via INTRAVENOUS

## 2014-10-31 MED ORDER — DEXAMETHASONE SODIUM PHOSPHATE 20 MG/5ML IJ SOLN
INTRAMUSCULAR | Status: AC
  Filled 2014-10-31: qty 5

## 2014-10-31 MED ORDER — SODIUM CHLORIDE 0.9 % IV SOLN
Freq: Once | INTRAVENOUS | Status: AC
  Administered 2014-10-31: 10:00:00 via INTRAVENOUS

## 2014-10-31 MED ORDER — PALONOSETRON HCL INJECTION 0.25 MG/5ML
0.2500 mg | Freq: Once | INTRAVENOUS | Status: AC
  Administered 2014-10-31: 0.25 mg via INTRAVENOUS

## 2014-10-31 MED ORDER — SODIUM CHLORIDE 0.9 % IV SOLN
150.0000 mg | Freq: Once | INTRAVENOUS | Status: AC
  Administered 2014-10-31: 150 mg via INTRAVENOUS
  Filled 2014-10-31: qty 5

## 2014-10-31 MED ORDER — PALONOSETRON HCL INJECTION 0.25 MG/5ML
INTRAVENOUS | Status: AC
  Filled 2014-10-31: qty 5

## 2014-10-31 NOTE — Patient Instructions (Addendum)
[facility] Discharge Instructions for Patients Receiving Chemotherapy  Today you received the following chemotherapy agents Abraxane/Cisplatin.   To help prevent nausea and vomiting after your treatment, we encourage you to take your nausea medication as directed.    If you develop nausea and vomiting that is not controlled by your nausea medication, call the clinic.   BELOW ARE SYMPTOMS THAT SHOULD BE REPORTED IMMEDIATELY:  *FEVER GREATER THAN 100.5 F  *CHILLS WITH OR WITHOUT FEVER  NAUSEA AND VOMITING THAT IS NOT CONTROLLED WITH YOUR NAUSEA MEDICATION  *UNUSUAL SHORTNESS OF BREATH  *UNUSUAL BRUISING OR BLEEDING  TENDERNESS IN MOUTH AND THROAT WITH OR WITHOUT PRESENCE OF ULCERS  *URINARY PROBLEMS  *BOWEL PROBLEMS  UNUSUAL RASH Items with * indicate a potential emergency and should be followed up as soon as possible.  Feel free to call the clinic you have any questions or concerns. The clinic phone number is [phone].

## 2014-11-07 ENCOUNTER — Other Ambulatory Visit (HOSPITAL_BASED_OUTPATIENT_CLINIC_OR_DEPARTMENT_OTHER): Payer: Medicare HMO

## 2014-11-07 DIAGNOSIS — C3411 Malignant neoplasm of upper lobe, right bronchus or lung: Secondary | ICD-10-CM

## 2014-11-07 DIAGNOSIS — C3431 Malignant neoplasm of lower lobe, right bronchus or lung: Secondary | ICD-10-CM

## 2014-11-07 DIAGNOSIS — C50811 Malignant neoplasm of overlapping sites of right female breast: Secondary | ICD-10-CM

## 2014-11-07 DIAGNOSIS — C3412 Malignant neoplasm of upper lobe, left bronchus or lung: Secondary | ICD-10-CM

## 2014-11-07 LAB — COMPREHENSIVE METABOLIC PANEL (CC13)
ALT: 16 U/L (ref 0–55)
AST: 20 U/L (ref 5–34)
Albumin: 3.5 g/dL (ref 3.5–5.0)
Alkaline Phosphatase: 66 U/L (ref 40–150)
Anion Gap: 6 mEq/L (ref 3–11)
BUN: 52.3 mg/dL — ABNORMAL HIGH (ref 7.0–26.0)
CALCIUM: 10.5 mg/dL — AB (ref 8.4–10.4)
CHLORIDE: 104 meq/L (ref 98–109)
CO2: 34 mEq/L — ABNORMAL HIGH (ref 22–29)
Creatinine: 1.7 mg/dL — ABNORMAL HIGH (ref 0.6–1.1)
Glucose: 114 mg/dl (ref 70–140)
Potassium: 4.7 mEq/L (ref 3.5–5.1)
SODIUM: 144 meq/L (ref 136–145)
TOTAL PROTEIN: 6.3 g/dL — AB (ref 6.4–8.3)
Total Bilirubin: 0.3 mg/dL (ref 0.20–1.20)

## 2014-11-07 LAB — CBC WITH DIFFERENTIAL/PLATELET
BASO%: 0.4 % (ref 0.0–2.0)
Basophils Absolute: 0 10*3/uL (ref 0.0–0.1)
EOS%: 0.7 % (ref 0.0–7.0)
Eosinophils Absolute: 0 10*3/uL (ref 0.0–0.5)
HCT: 32.3 % — ABNORMAL LOW (ref 34.8–46.6)
HGB: 10.3 g/dL — ABNORMAL LOW (ref 11.6–15.9)
LYMPH#: 0.7 10*3/uL — AB (ref 0.9–3.3)
LYMPH%: 18.2 % (ref 14.0–49.7)
MCH: 26.5 pg (ref 25.1–34.0)
MCHC: 31.9 g/dL (ref 31.5–36.0)
MCV: 83.2 fL (ref 79.5–101.0)
MONO#: 0.1 10*3/uL (ref 0.1–0.9)
MONO%: 3.4 % (ref 0.0–14.0)
NEUT#: 3.1 10*3/uL (ref 1.5–6.5)
NEUT%: 77.3 % — ABNORMAL HIGH (ref 38.4–76.8)
Platelets: 107 10*3/uL — ABNORMAL LOW (ref 145–400)
RBC: 3.88 10*6/uL (ref 3.70–5.45)
RDW: 16.8 % — AB (ref 11.2–14.5)
WBC: 4.1 10*3/uL (ref 3.9–10.3)

## 2014-11-07 LAB — MAGNESIUM (CC13): Magnesium: 2 mg/dl (ref 1.5–2.5)

## 2014-11-10 IMAGING — CT CT CHEST W/O CM
2 of 5 series · 15 of 36 positions shown, 18 images · non-contrast
Comparison: 10/24/2012

CLINICAL DATA: Lung cancer.  Left upper lobe wedge resection.
Shortness of breath.

CT CHEST WITHOUT CONTRAST
TECHNIQUE: Multidetector CT imaging of the chest was performed
following the standard protocol without IV contrast.

[Series 2: chest w/o st · axial · non-contrast · 0.81mm/px · z∈[-304,-34]mm · 12 of 64 slices shown, 15 images]
[im 5/64  mediastinal]
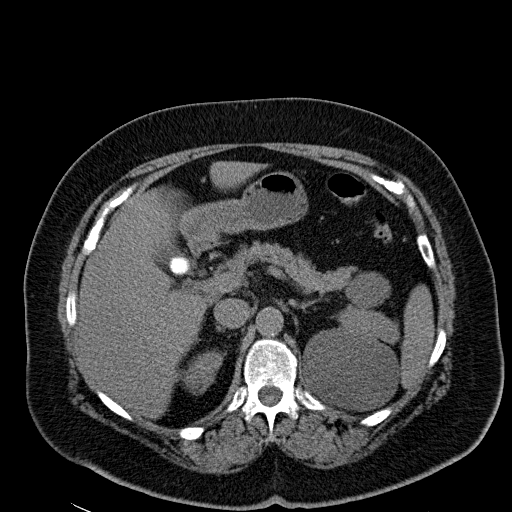
[im 5/64  lung]
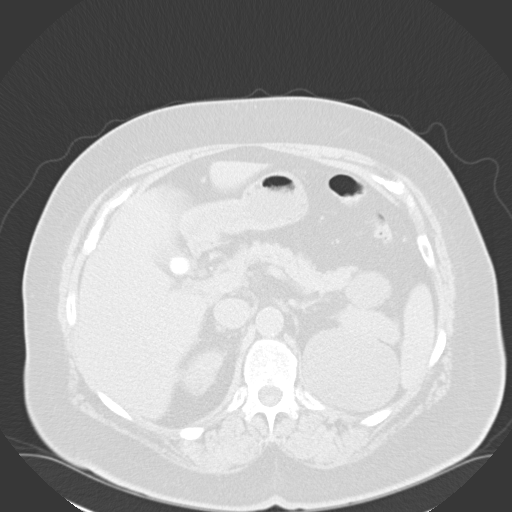
[im 10/64  lung]
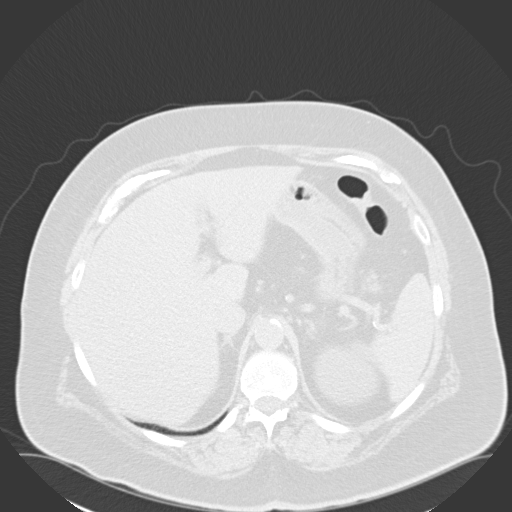
[im 15/64  lung]
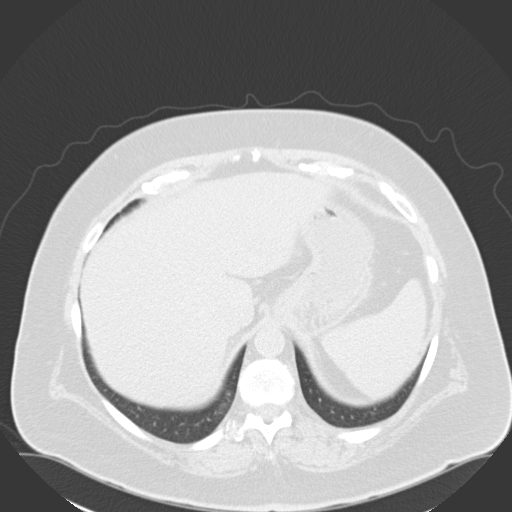
[im 20/64  lung]
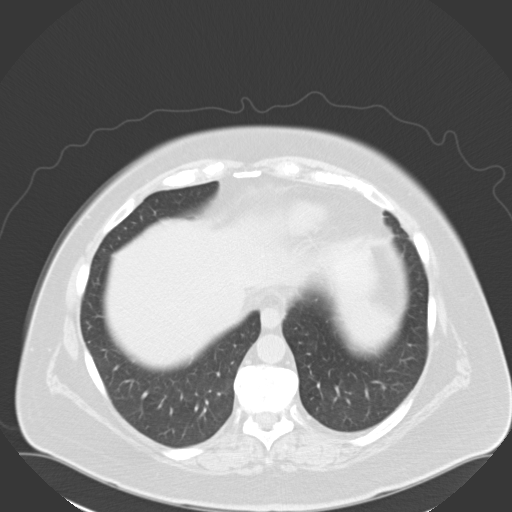
[im 25/64  mediastinal]
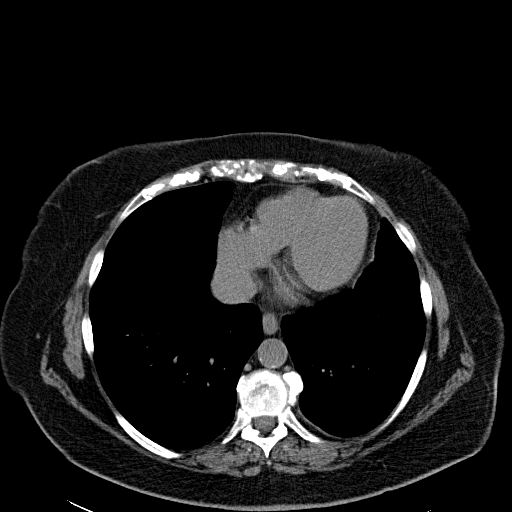
[im 25/64  lung]
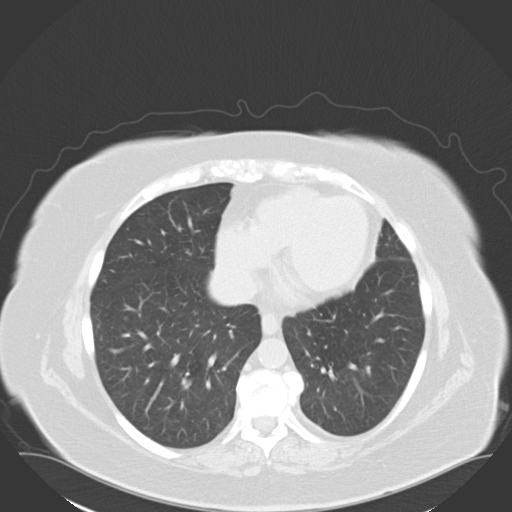
[im 30/64  lung]
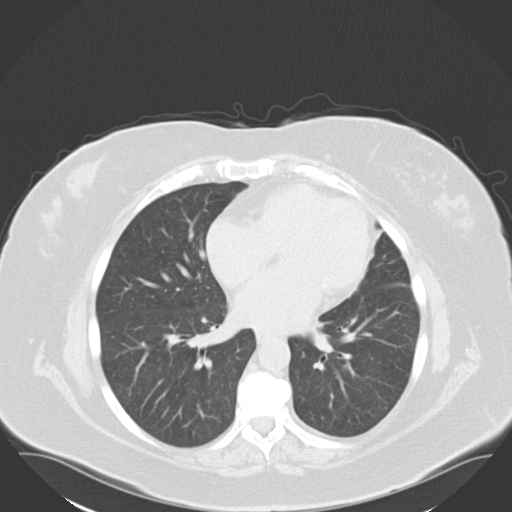
[im 34/64  lung]
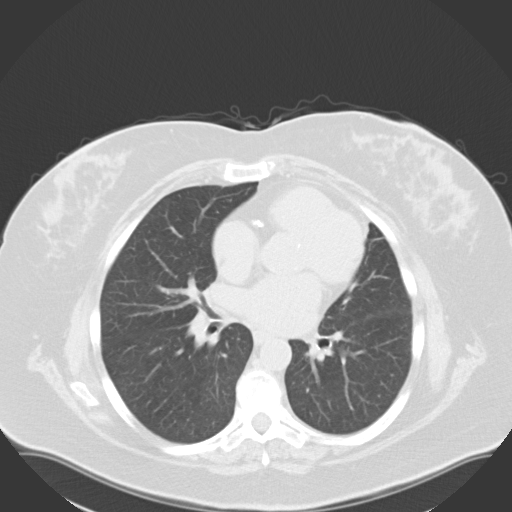
[im 39/64  lung]
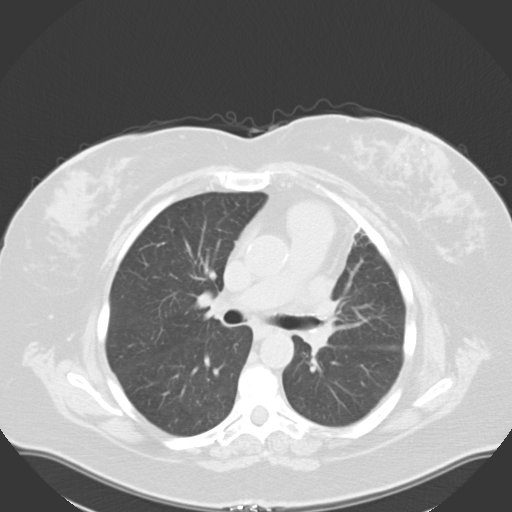
[im 44/64  mediastinal]
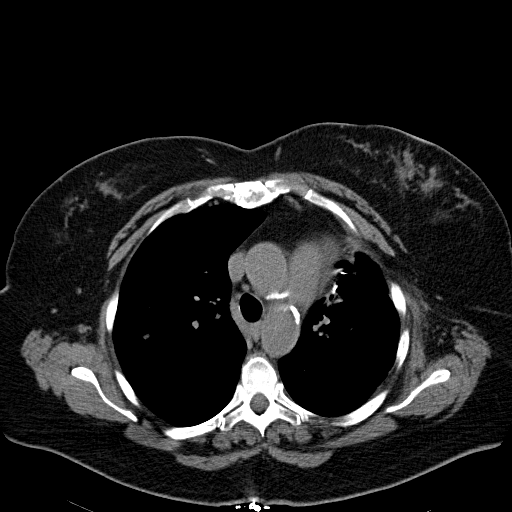
[im 44/64  lung]
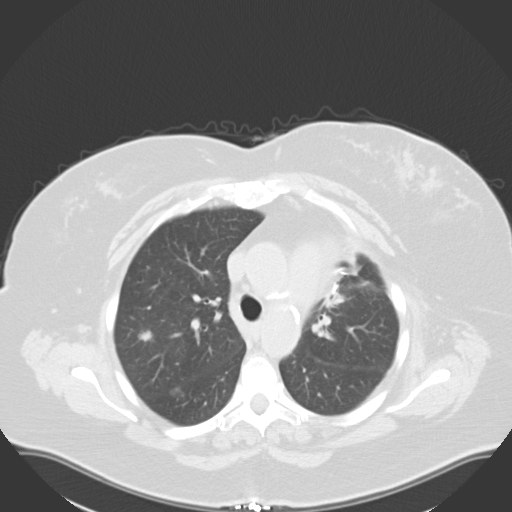
[im 49/64  lung]
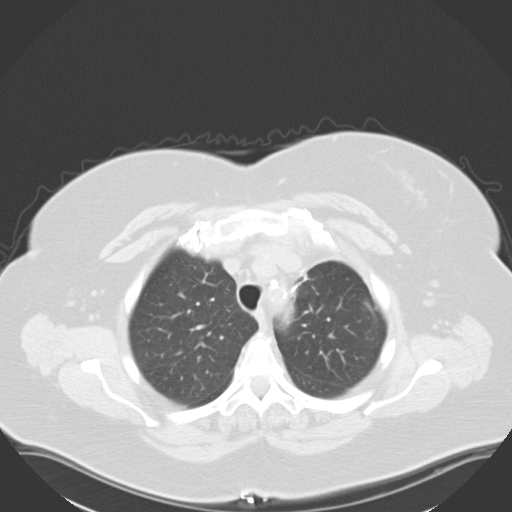
[im 54/64  lung]
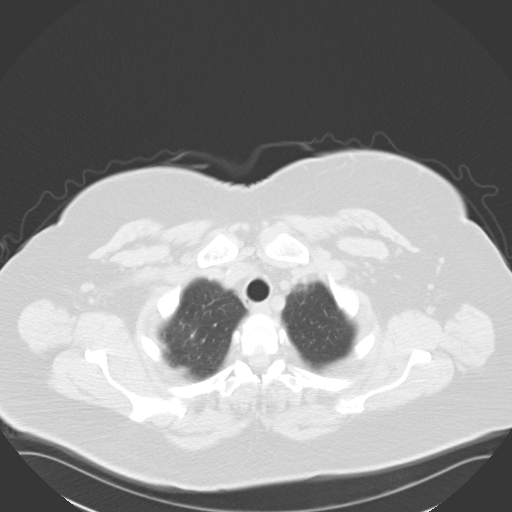
[im 59/64  lung]
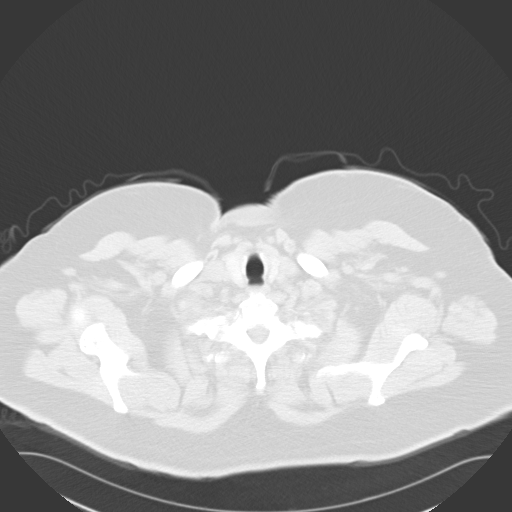

[Series 602: <mpr thick range> · coronal · 0.81mm/px · 3 of 95 slices shown]
[im 19/95  lung]
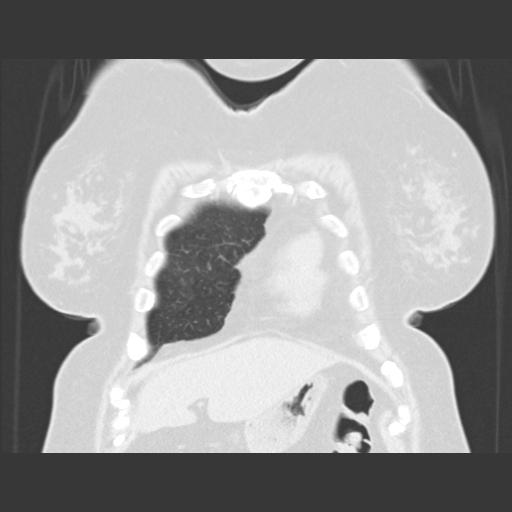
[im 38/95  lung]
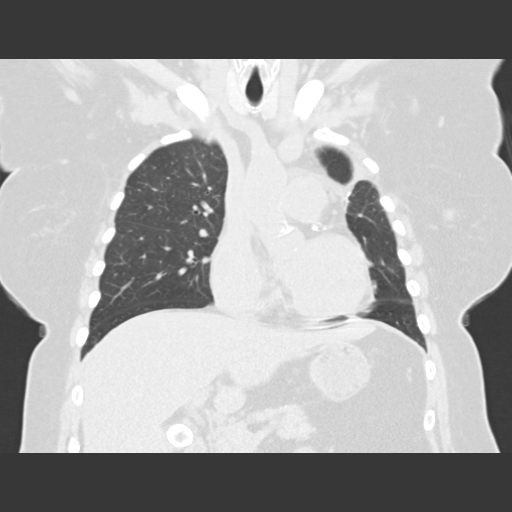
[im 57/95  lung]
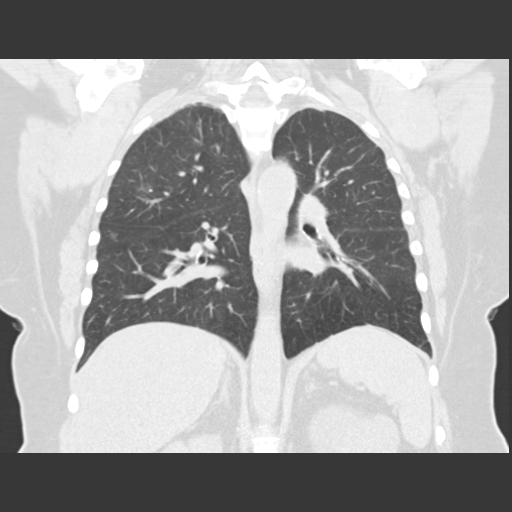

[15 of 36 positions shown; findings below may reference images not displayed]

FINDINGS: No pathologic thoracic adenopathy.  2.3 cm calcified
gallstone.  Similar appearance of the left kidney upper pole cysts.
Atherosclerosis appears stable.

Left upper lobectomy noted with residual scarring.

Ground-glass opacity in the superior segment left lower lobe, 1.2 x
0.8 cm (formerly 0.9 x 0.7 cm).  Ground-glass nodule in the
superior segment right lower lobe measures 1.8 x 1.2 cm (formerly
1.6 x 1.1 cm).  The more solid appearing lesion in the right upper
lobe measures 1.4 by 1.0 cm (formerly 1.2 x 0.9 cm by my
measurements).  Several other smaller ground-glass opacities are
present in the right upper lobe, without definite new nodules
identified.
IMPRESSION: 1.  Mild enlargement of bilateral nodules, most of which is are
ground-glass. The progressive enlargement over time is even more
apparent when one compares back several years.  A more solid
appearing lesion in the right upper lobe is also mildly enlarged.
Appearance favors multifocal low grade adenocarcinoma.
2.  Cholelithiasis.

## 2014-11-14 ENCOUNTER — Ambulatory Visit: Payer: Medicare HMO

## 2014-11-14 ENCOUNTER — Other Ambulatory Visit (HOSPITAL_BASED_OUTPATIENT_CLINIC_OR_DEPARTMENT_OTHER): Payer: Medicare HMO

## 2014-11-14 DIAGNOSIS — C3411 Malignant neoplasm of upper lobe, right bronchus or lung: Secondary | ICD-10-CM

## 2014-11-14 DIAGNOSIS — C3412 Malignant neoplasm of upper lobe, left bronchus or lung: Secondary | ICD-10-CM

## 2014-11-14 DIAGNOSIS — C3431 Malignant neoplasm of lower lobe, right bronchus or lung: Secondary | ICD-10-CM

## 2014-11-14 DIAGNOSIS — C50811 Malignant neoplasm of overlapping sites of right female breast: Secondary | ICD-10-CM

## 2014-11-14 LAB — CBC WITH DIFFERENTIAL/PLATELET
BASO%: 0 % (ref 0.0–2.0)
BASOS ABS: 0 10*3/uL (ref 0.0–0.1)
EOS%: 2.3 % (ref 0.0–7.0)
Eosinophils Absolute: 0.1 10*3/uL (ref 0.0–0.5)
HCT: 28.6 % — ABNORMAL LOW (ref 34.8–46.6)
HEMOGLOBIN: 9.2 g/dL — AB (ref 11.6–15.9)
LYMPH%: 20.9 % (ref 14.0–49.7)
MCH: 27.5 pg (ref 25.1–34.0)
MCHC: 32.2 g/dL (ref 31.5–36.0)
MCV: 85.4 fL (ref 79.5–101.0)
MONO#: 0.6 10*3/uL (ref 0.1–0.9)
MONO%: 13.6 % (ref 0.0–14.0)
NEUT#: 2.7 10*3/uL (ref 1.5–6.5)
NEUT%: 63.2 % (ref 38.4–76.8)
PLATELETS: 112 10*3/uL — AB (ref 145–400)
RBC: 3.35 10*6/uL — AB (ref 3.70–5.45)
RDW: 18.2 % — AB (ref 11.2–14.5)
WBC: 4.3 10*3/uL (ref 3.9–10.3)
lymph#: 0.9 10*3/uL (ref 0.9–3.3)

## 2014-11-14 LAB — COMPREHENSIVE METABOLIC PANEL (CC13)
ALBUMIN: 3.4 g/dL — AB (ref 3.5–5.0)
ALT: 14 U/L (ref 0–55)
AST: 21 U/L (ref 5–34)
Alkaline Phosphatase: 60 U/L (ref 40–150)
Anion Gap: 9 mEq/L (ref 3–11)
BUN: 34.3 mg/dL — ABNORMAL HIGH (ref 7.0–26.0)
CO2: 30 mEq/L — ABNORMAL HIGH (ref 22–29)
Calcium: 9.7 mg/dL (ref 8.4–10.4)
Chloride: 104 mEq/L (ref 98–109)
Creatinine: 1.7 mg/dL — ABNORMAL HIGH (ref 0.6–1.1)
Glucose: 109 mg/dl (ref 70–140)
POTASSIUM: 5.1 meq/L (ref 3.5–5.1)
Sodium: 144 mEq/L (ref 136–145)
Total Bilirubin: 0.39 mg/dL (ref 0.20–1.20)
Total Protein: 6.3 g/dL — ABNORMAL LOW (ref 6.4–8.3)

## 2014-11-14 LAB — MAGNESIUM (CC13): Magnesium: 2.4 mg/dl (ref 1.5–2.5)

## 2014-11-19 ENCOUNTER — Other Ambulatory Visit: Payer: Self-pay

## 2014-11-19 MED ORDER — WARFARIN SODIUM 2.5 MG PO TABS: 2.5000 mg | ORAL_TABLET | Freq: Every day | ORAL | Status: DC

## 2014-11-25 ENCOUNTER — Telehealth: Payer: Self-pay | Admitting: Internal Medicine

## 2014-11-25 ENCOUNTER — Encounter: Payer: Self-pay | Admitting: Internal Medicine

## 2014-11-25 ENCOUNTER — Ambulatory Visit (HOSPITAL_BASED_OUTPATIENT_CLINIC_OR_DEPARTMENT_OTHER): Payer: Medicare HMO | Admitting: Internal Medicine

## 2014-11-25 ENCOUNTER — Ambulatory Visit (INDEPENDENT_AMBULATORY_CARE_PROVIDER_SITE_OTHER): Payer: Medicare HMO | Admitting: Pharmacist

## 2014-11-25 ENCOUNTER — Other Ambulatory Visit (HOSPITAL_BASED_OUTPATIENT_CLINIC_OR_DEPARTMENT_OTHER): Payer: Medicare HMO

## 2014-11-25 ENCOUNTER — Ambulatory Visit (HOSPITAL_BASED_OUTPATIENT_CLINIC_OR_DEPARTMENT_OTHER): Payer: Medicare HMO

## 2014-11-25 VITALS — BP 147/82 | HR 87 | Temp 98.7°F | Resp 19 | Ht 71.0 in | Wt 279.4 lb

## 2014-11-25 DIAGNOSIS — C3411 Malignant neoplasm of upper lobe, right bronchus or lung: Secondary | ICD-10-CM

## 2014-11-25 DIAGNOSIS — C3431 Malignant neoplasm of lower lobe, right bronchus or lung: Secondary | ICD-10-CM

## 2014-11-25 DIAGNOSIS — Z5111 Encounter for antineoplastic chemotherapy: Secondary | ICD-10-CM

## 2014-11-25 DIAGNOSIS — C50811 Malignant neoplasm of overlapping sites of right female breast: Secondary | ICD-10-CM

## 2014-11-25 DIAGNOSIS — N289 Disorder of kidney and ureter, unspecified: Secondary | ICD-10-CM

## 2014-11-25 LAB — COMPREHENSIVE METABOLIC PANEL (CC13)
ALT: 13 U/L (ref 0–55)
AST: 18 U/L (ref 5–34)
Albumin: 3.4 g/dL — ABNORMAL LOW (ref 3.5–5.0)
Alkaline Phosphatase: 57 U/L (ref 40–150)
Anion Gap: 14 mEq/L — ABNORMAL HIGH (ref 3–11)
BILIRUBIN TOTAL: 0.36 mg/dL (ref 0.20–1.20)
BUN: 29.1 mg/dL — AB (ref 7.0–26.0)
CO2: 23 mEq/L (ref 22–29)
CREATININE: 1.6 mg/dL — AB (ref 0.6–1.1)
Calcium: 9.6 mg/dL (ref 8.4–10.4)
Chloride: 103 mEq/L (ref 98–109)
Glucose: 196 mg/dl — ABNORMAL HIGH (ref 70–140)
Potassium: 4.6 mEq/L (ref 3.5–5.1)
Sodium: 141 mEq/L (ref 136–145)
Total Protein: 6.6 g/dL (ref 6.4–8.3)

## 2014-11-25 LAB — CBC WITH DIFFERENTIAL/PLATELET
BASO%: 0.2 % (ref 0.0–2.0)
BASOS ABS: 0 10*3/uL (ref 0.0–0.1)
EOS%: 0 % (ref 0.0–7.0)
Eosinophils Absolute: 0 10*3/uL (ref 0.0–0.5)
HEMATOCRIT: 27.7 % — AB (ref 34.8–46.6)
HEMOGLOBIN: 9.1 g/dL — AB (ref 11.6–15.9)
LYMPH%: 6.7 % — AB (ref 14.0–49.7)
MCH: 28.4 pg (ref 25.1–34.0)
MCHC: 32.8 g/dL (ref 31.5–36.0)
MCV: 86.6 fL (ref 79.5–101.0)
MONO#: 0.5 10*3/uL (ref 0.1–0.9)
MONO%: 4.6 % (ref 0.0–14.0)
NEUT#: 8.8 10*3/uL — ABNORMAL HIGH (ref 1.5–6.5)
NEUT%: 88.5 % — AB (ref 38.4–76.8)
Platelets: 297 10*3/uL (ref 145–400)
RBC: 3.2 10*6/uL — ABNORMAL LOW (ref 3.70–5.45)
RDW: 23 % — ABNORMAL HIGH (ref 11.2–14.5)
WBC: 10 10*3/uL (ref 3.9–10.3)
lymph#: 0.7 10*3/uL — ABNORMAL LOW (ref 0.9–3.3)

## 2014-11-25 LAB — POCT INR: INR: 1.97

## 2014-11-25 LAB — MAGNESIUM (CC13): Magnesium: 2.4 mg/dl (ref 1.5–2.5)

## 2014-11-25 MED ORDER — MANNITOL 25 % IV SOLN
Freq: Once | INTRAVENOUS | Status: AC
  Administered 2014-11-25: 10:00:00 via INTRAVENOUS
  Filled 2014-11-25: qty 10

## 2014-11-25 MED ORDER — DEXAMETHASONE SODIUM PHOSPHATE 20 MG/5ML IJ SOLN
12.0000 mg | Freq: Once | INTRAMUSCULAR | Status: AC
  Administered 2014-11-25: 12 mg via INTRAVENOUS

## 2014-11-25 MED ORDER — PALONOSETRON HCL INJECTION 0.25 MG/5ML
INTRAVENOUS | Status: AC
  Filled 2014-11-25: qty 5

## 2014-11-25 MED ORDER — DEXAMETHASONE SODIUM PHOSPHATE 20 MG/5ML IJ SOLN
INTRAMUSCULAR | Status: AC
  Filled 2014-11-25: qty 5

## 2014-11-25 MED ORDER — PALONOSETRON HCL INJECTION 0.25 MG/5ML
0.2500 mg | Freq: Once | INTRAVENOUS | Status: AC
  Administered 2014-11-25: 0.25 mg via INTRAVENOUS

## 2014-11-25 MED ORDER — SODIUM CHLORIDE 0.9 % IV SOLN
Freq: Once | INTRAVENOUS | Status: AC
  Administered 2014-11-25: 10:00:00 via INTRAVENOUS

## 2014-11-25 MED ORDER — SODIUM CHLORIDE 0.9 % IV SOLN
60.0000 mg/m2 | Freq: Once | INTRAVENOUS | Status: AC
  Administered 2014-11-25: 149 mg via INTRAVENOUS
  Filled 2014-11-25: qty 149

## 2014-11-25 MED ORDER — SODIUM CHLORIDE 0.9 % IV SOLN
400.0000 mg/m2 | Freq: Once | INTRAVENOUS | Status: AC
  Administered 2014-11-25: 1000 mg via INTRAVENOUS
  Filled 2014-11-25: qty 40

## 2014-11-25 MED ORDER — SODIUM CHLORIDE 0.9 % IV SOLN
150.0000 mg | Freq: Once | INTRAVENOUS | Status: AC
  Administered 2014-11-25: 150 mg via INTRAVENOUS
  Filled 2014-11-25: qty 5
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
## 2014-11-25 NOTE — Progress Notes (Signed)
INR slightly subtherapeutic but improved   Anticoagulation Dose Instructions as of 11/25/2014      [name] Tue Wed Thu Fri Sat   New Dose 5 mg 5 mg 2.5 mg 5 mg 5 mg 2.5 mg 2.5 mg    Description        Continue warfarin 5mg  1 tablet daily except 1/2 tablet tuesdays, fridays and saturdays.

## 2014-11-25 NOTE — Telephone Encounter (Signed)
gv and printed appt sched and avs for pt for DEC °

## 2014-11-26 NOTE — Patient Instructions (Signed)
[facility] Discharge Instructions for Patients Receiving Chemotherapy  Today you received the following chemotherapy agents Alimta and Cisplatin.   To help prevent nausea and vomiting after your treatment, we encourage you to take your nausea medication.   If you develop nausea and vomiting that is not controlled by your nausea medication, call the clinic.   BELOW ARE SYMPTOMS THAT SHOULD BE REPORTED IMMEDIATELY:  *FEVER GREATER THAN 100.5 F  *CHILLS WITH OR WITHOUT FEVER  NAUSEA AND VOMITING THAT IS NOT CONTROLLED WITH YOUR NAUSEA MEDICATION  *UNUSUAL SHORTNESS OF BREATH  *UNUSUAL BRUISING OR BLEEDING  TENDERNESS IN MOUTH AND THROAT WITH OR WITHOUT PRESENCE OF ULCERS  *URINARY PROBLEMS  *BOWEL PROBLEMS  UNUSUAL RASH Items with * indicate a potential emergency and should be followed up as soon as possible.  Feel free to call the clinic you have any questions or concerns. The clinic phone number is [phone].

## 2014-12-02 ENCOUNTER — Other Ambulatory Visit: Payer: Self-pay | Admitting: Internal Medicine

## 2014-12-02 ENCOUNTER — Other Ambulatory Visit (HOSPITAL_BASED_OUTPATIENT_CLINIC_OR_DEPARTMENT_OTHER): Payer: Medicare HMO

## 2014-12-02 DIAGNOSIS — C3431 Malignant neoplasm of lower lobe, right bronchus or lung: Secondary | ICD-10-CM

## 2014-12-02 LAB — CBC WITH DIFFERENTIAL/PLATELET
BASO%: 0.5 % (ref 0.0–2.0)
BASOS ABS: 0 10*3/uL (ref 0.0–0.1)
EOS%: 0.8 % (ref 0.0–7.0)
Eosinophils Absolute: 0.1 10*3/uL (ref 0.0–0.5)
HEMATOCRIT: 29.5 % — AB (ref 34.8–46.6)
HGB: 9.7 g/dL — ABNORMAL LOW (ref 11.6–15.9)
LYMPH%: 12.4 % — AB (ref 14.0–49.7)
MCH: 28.6 pg (ref 25.1–34.0)
MCHC: 32.8 g/dL (ref 31.5–36.0)
MCV: 87.1 fL (ref 79.5–101.0)
MONO#: 0.1 10*3/uL (ref 0.1–0.9)
MONO%: 1.8 % (ref 0.0–14.0)
NEUT#: 5.2 10*3/uL (ref 1.5–6.5)
NEUT%: 84.5 % — AB (ref 38.4–76.8)
Platelets: 157 10*3/uL (ref 145–400)
RBC: 3.38 10*6/uL — ABNORMAL LOW (ref 3.70–5.45)
RDW: 22.6 % — ABNORMAL HIGH (ref 11.2–14.5)
WBC: 6.1 10*3/uL (ref 3.9–10.3)
lymph#: 0.8 10*3/uL — ABNORMAL LOW (ref 0.9–3.3)

## 2014-12-02 LAB — COMPREHENSIVE METABOLIC PANEL (CC13)
ALBUMIN: 3.5 g/dL (ref 3.5–5.0)
ALK PHOS: 54 U/L (ref 40–150)
ALT: 24 U/L (ref 0–55)
AST: 22 U/L (ref 5–34)
Anion Gap: 11 mEq/L (ref 3–11)
BUN: 61 mg/dL — ABNORMAL HIGH (ref 7.0–26.0)
CO2: 31 mEq/L — ABNORMAL HIGH (ref 22–29)
Calcium: 10 mg/dL (ref 8.4–10.4)
Chloride: 97 mEq/L — ABNORMAL LOW (ref 98–109)
Creatinine: 1.9 mg/dL — ABNORMAL HIGH (ref 0.6–1.1)
EGFR: 25 mL/min/{1.73_m2} — ABNORMAL LOW (ref >90)
Glucose: 105 mg/dl (ref 70–140)
Potassium: 4.9 mEq/L (ref 3.5–5.1)
Sodium: 139 mEq/L (ref 136–145)
Total Bilirubin: 0.44 mg/dL (ref 0.20–1.20)
Total Protein: 6.3 g/dL — ABNORMAL LOW (ref 6.4–8.3)

## 2014-12-02 LAB — POCT INR: INR: 2.39

## 2014-12-02 LAB — MAGNESIUM (CC13): MAGNESIUM: 2.6 mg/dL — AB (ref 1.5–2.5)

## 2014-12-04 ENCOUNTER — Ambulatory Visit (INDEPENDENT_AMBULATORY_CARE_PROVIDER_SITE_OTHER): Payer: Medicare HMO | Admitting: Pharmacist

## 2014-12-09 ENCOUNTER — Other Ambulatory Visit (HOSPITAL_BASED_OUTPATIENT_CLINIC_OR_DEPARTMENT_OTHER): Payer: Medicare HMO

## 2014-12-09 ENCOUNTER — Ambulatory Visit (INDEPENDENT_AMBULATORY_CARE_PROVIDER_SITE_OTHER): Payer: Medicare HMO | Admitting: Pharmacist

## 2014-12-09 ENCOUNTER — Other Ambulatory Visit: Payer: Self-pay | Admitting: Pharmacist

## 2014-12-09 DIAGNOSIS — C3412 Malignant neoplasm of upper lobe, left bronchus or lung: Secondary | ICD-10-CM

## 2014-12-09 DIAGNOSIS — C3411 Malignant neoplasm of upper lobe, right bronchus or lung: Secondary | ICD-10-CM

## 2014-12-09 DIAGNOSIS — C50811 Malignant neoplasm of overlapping sites of right female breast: Secondary | ICD-10-CM

## 2014-12-09 DIAGNOSIS — C3431 Malignant neoplasm of lower lobe, right bronchus or lung: Secondary | ICD-10-CM

## 2014-12-09 DIAGNOSIS — I4891 Unspecified atrial fibrillation: Secondary | ICD-10-CM

## 2014-12-09 LAB — COMPREHENSIVE METABOLIC PANEL (CC13)
ALBUMIN: 3.4 g/dL — AB (ref 3.5–5.0)
ALT: 19 U/L (ref 0–55)
ANION GAP: 11 meq/L (ref 3–11)
AST: 23 U/L (ref 5–34)
Alkaline Phosphatase: 59 U/L (ref 40–150)
BUN: 43 mg/dL — AB (ref 7.0–26.0)
CALCIUM: 9.3 mg/dL (ref 8.4–10.4)
CO2: 28 meq/L (ref 22–29)
CREATININE: 1.8 mg/dL — AB (ref 0.6–1.1)
Chloride: 105 mEq/L (ref 98–109)
EGFR: 27 mL/min/{1.73_m2} — ABNORMAL LOW (ref >90)
Glucose: 102 mg/dl (ref 70–140)
POTASSIUM: 4.7 meq/L (ref 3.5–5.1)
Sodium: 143 mEq/L (ref 136–145)
Total Bilirubin: 0.25 mg/dL (ref 0.20–1.20)
Total Protein: 5.9 g/dL — ABNORMAL LOW (ref 6.4–8.3)

## 2014-12-09 LAB — CBC WITH DIFFERENTIAL/PLATELET
BASO%: 0.3 % (ref 0.0–2.0)
Basophils Absolute: 0 10*3/uL (ref 0.0–0.1)
EOS%: 2.8 % (ref 0.0–7.0)
Eosinophils Absolute: 0.1 10*3/uL (ref 0.0–0.5)
HCT: 23.6 % — ABNORMAL LOW (ref 34.8–46.6)
HGB: 7.7 g/dL — ABNORMAL LOW (ref 11.6–15.9)
LYMPH%: 21.1 % (ref 14.0–49.7)
MCH: 29.3 pg (ref 25.1–34.0)
MCHC: 32.6 g/dL (ref 31.5–36.0)
MCV: 89.7 fL (ref 79.5–101.0)
MONO#: 0.5 10*3/uL (ref 0.1–0.9)
MONO%: 12.8 % (ref 0.0–14.0)
NEUT%: 63 % (ref 38.4–76.8)
NEUTROS ABS: 2.2 10*3/uL (ref 1.5–6.5)
Platelets: 112 10*3/uL — ABNORMAL LOW (ref 145–400)
RBC: 2.63 10*6/uL — AB (ref 3.70–5.45)
RDW: 21.4 % — AB (ref 11.2–14.5)
WBC: 3.5 10*3/uL — ABNORMAL LOW (ref 3.9–10.3)
lymph#: 0.7 10*3/uL — ABNORMAL LOW (ref 0.9–3.3)

## 2014-12-09 LAB — PROTIME-INR
INR: 1.49 (ref <1.50)
PROTHROMBIN TIME: 18 s — AB (ref 11.6–15.2)

## 2014-12-13 ENCOUNTER — Other Ambulatory Visit: Payer: Self-pay | Admitting: *Deleted

## 2014-12-13 DIAGNOSIS — C3490 Malignant neoplasm of unspecified part of unspecified bronchus or lung: Secondary | ICD-10-CM

## 2014-12-16 ENCOUNTER — Telehealth: Payer: Self-pay | Admitting: Internal Medicine

## 2014-12-16 ENCOUNTER — Encounter (HOSPITAL_COMMUNITY): Payer: Self-pay

## 2014-12-16 ENCOUNTER — Ambulatory Visit (HOSPITAL_BASED_OUTPATIENT_CLINIC_OR_DEPARTMENT_OTHER): Payer: Medicare HMO | Admitting: Lab

## 2014-12-16 ENCOUNTER — Ambulatory Visit (HOSPITAL_BASED_OUTPATIENT_CLINIC_OR_DEPARTMENT_OTHER): Payer: Medicare HMO | Admitting: Internal Medicine

## 2014-12-16 ENCOUNTER — Ambulatory Visit (HOSPITAL_COMMUNITY)
Admission: RE | Admit: 2014-12-16 | Discharge: 2014-12-16 | Disposition: A | Discharge status: Home / Self Care | Payer: Medicare HMO | Source: Ambulatory Visit | Attending: Internal Medicine | Admitting: Internal Medicine

## 2014-12-16 ENCOUNTER — Encounter: Payer: Self-pay | Admitting: Internal Medicine

## 2014-12-16 VITALS — BP 168/81 | HR 81 | Temp 98.0°F | Resp 19 | Ht 71.0 in | Wt 276.0 lb

## 2014-12-16 DIAGNOSIS — C50411 Malignant neoplasm of upper-outer quadrant of right female breast: Secondary | ICD-10-CM | POA: Insufficient documentation

## 2014-12-16 DIAGNOSIS — C50811 Malignant neoplasm of overlapping sites of right female breast: Secondary | ICD-10-CM

## 2014-12-16 DIAGNOSIS — R0602 Shortness of breath: Secondary | ICD-10-CM | POA: Diagnosis not present

## 2014-12-16 DIAGNOSIS — C3411 Malignant neoplasm of upper lobe, right bronchus or lung: Secondary | ICD-10-CM

## 2014-12-16 DIAGNOSIS — C3490 Malignant neoplasm of unspecified part of unspecified bronchus or lung: Secondary | ICD-10-CM

## 2014-12-16 DIAGNOSIS — Z9221 Personal history of antineoplastic chemotherapy: Secondary | ICD-10-CM | POA: Diagnosis not present

## 2014-12-16 DIAGNOSIS — D6481 Anemia due to antineoplastic chemotherapy: Secondary | ICD-10-CM

## 2014-12-16 DIAGNOSIS — C3431 Malignant neoplasm of lower lobe, right bronchus or lung: Secondary | ICD-10-CM | POA: Insufficient documentation

## 2014-12-16 DIAGNOSIS — Z9889 Other specified postprocedural states: Secondary | ICD-10-CM | POA: Insufficient documentation

## 2014-12-16 DIAGNOSIS — N289 Disorder of kidney and ureter, unspecified: Secondary | ICD-10-CM

## 2014-12-16 DIAGNOSIS — C3491 Malignant neoplasm of unspecified part of right bronchus or lung: Secondary | ICD-10-CM

## 2014-12-16 LAB — COMPREHENSIVE METABOLIC PANEL (CC13)
ALT: 16 U/L (ref 0–55)
AST: 23 U/L (ref 5–34)
Albumin: 3.3 g/dL — ABNORMAL LOW (ref 3.5–5.0)
Alkaline Phosphatase: 52 U/L (ref 40–150)
Anion Gap: 11 mEq/L (ref 3–11)
BUN: 34.3 mg/dL — ABNORMAL HIGH (ref 7.0–26.0)
CHLORIDE: 103 meq/L (ref 98–109)
CO2: 29 meq/L (ref 22–29)
CREATININE: 1.8 mg/dL — AB (ref 0.6–1.1)
Calcium: 10.6 mg/dL — ABNORMAL HIGH (ref 8.4–10.4)
EGFR: 28 mL/min/{1.73_m2} — ABNORMAL LOW (ref >90)
Glucose: 105 mg/dl (ref 70–140)
Potassium: 4.7 mEq/L (ref 3.5–5.1)
SODIUM: 143 meq/L (ref 136–145)
TOTAL PROTEIN: 6.4 g/dL (ref 6.4–8.3)
Total Bilirubin: 0.35 mg/dL (ref 0.20–1.20)

## 2014-12-16 LAB — CBC WITH DIFFERENTIAL/PLATELET
BASO%: 0.7 % (ref 0.0–2.0)
Basophils Absolute: 0 10*3/uL (ref 0.0–0.1)
EOS%: 5.9 % (ref 0.0–7.0)
Eosinophils Absolute: 0.3 10*3/uL (ref 0.0–0.5)
HEMATOCRIT: 23.9 % — AB (ref 34.8–46.6)
HGB: 7.8 g/dL — ABNORMAL LOW (ref 11.6–15.9)
LYMPH%: 18.1 % (ref 14.0–49.7)
MCH: 29.8 pg (ref 25.1–34.0)
MCHC: 32.5 g/dL (ref 31.5–36.0)
MCV: 91.6 fL (ref 79.5–101.0)
MONO#: 0.8 10*3/uL (ref 0.1–0.9)
MONO%: 19.1 % — ABNORMAL HIGH (ref 0.0–14.0)
NEUT#: 2.4 10*3/uL (ref 1.5–6.5)
NEUT%: 56.2 % (ref 38.4–76.8)
Platelets: 320 10*3/uL (ref 145–400)
RBC: 2.61 10*6/uL — AB (ref 3.70–5.45)
RDW: 24.9 % — ABNORMAL HIGH (ref 11.2–14.5)
WBC: 4.3 10*3/uL (ref 3.9–10.3)
lymph#: 0.8 10*3/uL — ABNORMAL LOW (ref 0.9–3.3)

## 2014-12-16 LAB — POCT INR: INR: 1.55

## 2014-12-16 MED ORDER — PROCHLORPERAZINE MALEATE 10 MG PO TABS: 10.0000 mg | ORAL_TABLET | Freq: Four times a day (QID) | ORAL | Status: DC | PRN

## 2014-12-16 NOTE — Telephone Encounter (Signed)
gv and printed appt sched and avs forpt for March 2016 °
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
## 2014-12-18 ENCOUNTER — Ambulatory Visit (INDEPENDENT_AMBULATORY_CARE_PROVIDER_SITE_OTHER): Payer: Medicare HMO | Admitting: Pharmacist

## 2014-12-18 NOTE — Patient Instructions (Signed)
Anticoagulation Dose Instructions as of 12/18/2014      [name] Tue Wed Thu Fri Sat   New Dose 5 mg 5 mg 2.5 mg 5 mg 5 mg 5 mg 2.5 mg    Description        Patient was told by her oncologist she was anemic.  Will abe conservative about dose increase. Take 2 tablets today, then increase warfarin 2.5mg  1 tablet on tuesdays and saturdays and 2 tablets all other days.

## 2015-01-01 ENCOUNTER — Other Ambulatory Visit: Payer: Self-pay | Admitting: *Deleted

## 2015-01-01 DIAGNOSIS — C3491 Malignant neoplasm of unspecified part of right bronchus or lung: Secondary | ICD-10-CM

## 2015-01-02 ENCOUNTER — Ambulatory Visit (INDEPENDENT_AMBULATORY_CARE_PROVIDER_SITE_OTHER): Payer: PPO | Admitting: Pharmacist

## 2015-01-02 DIAGNOSIS — I4891 Unspecified atrial fibrillation: Secondary | ICD-10-CM

## 2015-01-02 LAB — POCT INR: INR: 1.8

## 2015-01-02 MED ORDER — WARFARIN SODIUM 5 MG PO TABS: 5.0000 mg | ORAL_TABLET | Freq: Every day | ORAL | Status: DC

## 2015-01-02 MED ORDER — HYDROCHLOROTHIAZIDE 12.5 MG PO CAPS: 25.0000 mg | ORAL_CAPSULE | ORAL | Status: DC

## 2015-01-02 MED ORDER — METOPROLOL TARTRATE 100 MG PO TABS
150.0000 mg | ORAL_TABLET | Freq: Two times a day (BID) | ORAL | Status: DC
Start: 2015-01-02 — End: 2015-02-28

## 2015-01-02 MED ORDER — SIMVASTATIN 10 MG PO TABS: 10.0000 mg | ORAL_TABLET | Freq: Every day | ORAL | Status: DC

## 2015-01-02 NOTE — Patient Instructions (Signed)
Anticoagulation Dose Instructions as of 01/02/2015      Tammie [name] Tue Wed Thu Fri Sat   New Dose 5 mg 5 mg 2.5 mg 5 mg 5 mg 5 mg 2.5 mg    Description        Continue warfarin 5mg  1/2 tablet on tuesdays and saturdays and 1 tablets all other days.      INR was 1.8 today

## 2015-01-20 ENCOUNTER — Other Ambulatory Visit (INDEPENDENT_AMBULATORY_CARE_PROVIDER_SITE_OTHER): Payer: Self-pay | Admitting: General Surgery

## 2015-01-20 DIAGNOSIS — C50911 Malignant neoplasm of unspecified site of right female breast: Secondary | ICD-10-CM

## 2015-01-21 ENCOUNTER — Other Ambulatory Visit (INDEPENDENT_AMBULATORY_CARE_PROVIDER_SITE_OTHER): Payer: Self-pay | Admitting: *Deleted

## 2015-01-30 ENCOUNTER — Ambulatory Visit (INDEPENDENT_AMBULATORY_CARE_PROVIDER_SITE_OTHER): Payer: PPO | Admitting: Pharmacist

## 2015-01-30 DIAGNOSIS — R609 Edema, unspecified: Secondary | ICD-10-CM

## 2015-01-30 DIAGNOSIS — R791 Abnormal coagulation profile: Secondary | ICD-10-CM

## 2015-01-30 DIAGNOSIS — I4891 Unspecified atrial fibrillation: Secondary | ICD-10-CM

## 2015-01-30 DIAGNOSIS — D649 Anemia, unspecified: Secondary | ICD-10-CM

## 2015-01-30 LAB — POCT CBC
GRANULOCYTE PERCENT: 74.5 % (ref 37–80)
HCT, POC: 31.8 % — AB (ref 37.7–47.9)
Hemoglobin: 9.9 g/dL — AB (ref 12.2–16.2)
Lymph, poc: 1.4 (ref 0.6–3.4)
MCH: 28.2 pg (ref 27–31.2)
MCHC: 31.3 g/dL — AB (ref 31.8–35.4)
MCV: 90.3 fL (ref 80–97)
MPV: 6.3 fL (ref 0–99.8)
POC Granulocyte: 5.7 (ref 2–6.9)
POC LYMPH %: 18.9 % (ref 10–50)
Platelet Count, POC: 215 10*3/uL (ref 142–424)
RBC: 3.5 M/uL — AB (ref 4.04–5.48)
RDW, POC: 13.9 %
WBC: 7.6 10*3/uL (ref 4.6–10.2)

## 2015-01-30 LAB — POCT INR: INR: 7

## 2015-01-30 MED ORDER — FUROSEMIDE 40 MG PO TABS: 40.0000 mg | ORAL_TABLET | ORAL | Status: DC

## 2015-01-30 MED ORDER — PHYTONADIONE 5 MG PO TABS
5.0000 mg | ORAL_TABLET | Freq: Once | ORAL | Status: AC
  Administered 2015-01-30: 5 mg via ORAL

## 2015-01-30 NOTE — Progress Notes (Signed)
Subjective:     Ind3ication: atrial fibrillation Bleeding signs/symptoms: None - though we have been following patient's hemoglobin since chemotherapy.  Last HBG was 7.8 (78/89/33882 Thromboembolic signs/symptoms: None  Missed Coumadin doses: None Medication changes: yes - finished chemo about 2 months ago Dietary changes: no Bacterial/viral infection: no Other concerns: yes - patient c/o edema  Evaluated lower extremities.  Noted 1+ pitting edema bilaterally in lower extremities. Mrs. Laffoon is currently taking HCTZ 12.89m 2 capsules daily.  The following portions of the patient's history were reviewed and updated as appropriate: allergies, current medications and problem list.  Objective:    INR Today: 7.0 Current dose: warfarin 564m1/2 tablet tuesdays and saturdays and 1 tablet all other days.  HBG today was 9.9   Assessment:    Supratherapeutic INR for goal of 2-3   Low hemoglobin - improving LE edema  Plan:    1. New dose:  Patient given oral vitamin K in office - 54m99m Anticoagulation Dose Instructions as of 01/30/2015      SunDorene Grebee Wed Thu Fri Sat   New Dose 2.5 mg 5 mg 2.5 mg 2.5 mg 2.5 mg 5 mg 2.5 mg    Description        No warfarin for next 2 days - given oral vitamin K in office today.  Then decrease dose to 1 tablet mondays and fridays.  1/2 tablet all other days.     2.  D/c HCTZ , start furosemide 45m37mtablet daily  Orders Placed This Encounter  Procedures  . CMP14+EGFR  . POCT CBC   RTC in 5 days to recheck INR  TammCherre RobinsarmD, CPP

## 2015-01-30 NOTE — Patient Instructions (Addendum)
Anticoagulation Dose Instructions as of 01/30/2015      Tammie [name] Tue Wed Thu Fri Sat   New Dose 2.5 mg 5 mg 2.5 mg 2.5 mg 2.5 mg 5 mg 2.5 mg    Description        No warfarin for next 2 days - given oral vitamin K in office today.  Then decrease dose to 1 tablet mondays and fridays.  1/2 tablet all other days.      INR was 7.0 today (too thin)

## 2015-01-31 ENCOUNTER — Ambulatory Visit
Admission: RE | Admit: 2015-01-31 | Discharge: 2015-01-31 | Disposition: A | Discharge status: Home / Self Care | Payer: PPO | Source: Ambulatory Visit | Attending: General Surgery | Admitting: General Surgery

## 2015-01-31 ENCOUNTER — Telehealth: Payer: Self-pay | Admitting: Pharmacist

## 2015-01-31 DIAGNOSIS — C50911 Malignant neoplasm of unspecified site of right female breast: Secondary | ICD-10-CM

## 2015-01-31 LAB — CMP14+EGFR
A/G RATIO: 1.8 (ref 1.1–2.5)
ALT: 14 IU/L (ref 0–32)
AST: 24 IU/L (ref 0–40)
Albumin: 4.1 g/dL (ref 3.5–4.8)
Alkaline Phosphatase: 46 IU/L (ref 39–117)
BILIRUBIN TOTAL: 0.3 mg/dL (ref 0.0–1.2)
BUN / CREAT RATIO: 21 (ref 11–26)
BUN: 37 mg/dL — AB (ref 8–27)
CALCIUM: 10.3 mg/dL (ref 8.7–10.3)
CHLORIDE: 99 mmol/L (ref 97–108)
CO2: 28 mmol/L (ref 18–29)
CREATININE: 1.79 mg/dL — AB (ref 0.57–1.00)
GFR calc Af Amer: 32 mL/min/{1.73_m2} — ABNORMAL LOW (ref >59)
GFR calc non Af Amer: 28 mL/min/{1.73_m2} — ABNORMAL LOW (ref >59)
GLOBULIN, TOTAL: 2.3 g/dL (ref 1.5–4.5)
Glucose: 89 mg/dL (ref 65–99)
Potassium: 5.3 mmol/L — ABNORMAL HIGH (ref 3.5–5.2)
SODIUM: 144 mmol/L (ref 134–144)
TOTAL PROTEIN: 6.4 g/dL (ref 6.0–8.5)

## 2015-01-31 NOTE — Telephone Encounter (Signed)
Serum creatinine stable but potassium elevated - patient to avoid foods high in potassium.  She has appt to recheck INR Tuesday - 02/04/15 - will recheck potassium then. Patient notified.

## 2015-02-04 ENCOUNTER — Ambulatory Visit (INDEPENDENT_AMBULATORY_CARE_PROVIDER_SITE_OTHER): Payer: PPO | Admitting: Pharmacist Clinician (PhC)/ Clinical Pharmacy Specialist

## 2015-02-04 DIAGNOSIS — I4891 Unspecified atrial fibrillation: Secondary | ICD-10-CM

## 2015-02-04 DIAGNOSIS — E876 Hypokalemia: Secondary | ICD-10-CM

## 2015-02-04 LAB — POCT INR: INR: 2.2

## 2015-02-05 LAB — BMP8+EGFR
BUN/Creatinine Ratio: 16 (ref 11–26)
BUN: 28 mg/dL — ABNORMAL HIGH (ref 8–27)
CALCIUM: 9.5 mg/dL (ref 8.7–10.3)
CO2: 25 mmol/L (ref 18–29)
Chloride: 105 mmol/L (ref 97–108)
Creatinine, Ser: 1.74 mg/dL — ABNORMAL HIGH (ref 0.57–1.00)
GFR, EST AFRICAN AMERICAN: 33 mL/min/{1.73_m2} — AB (ref >59)
GFR, EST NON AFRICAN AMERICAN: 29 mL/min/{1.73_m2} — AB (ref >59)
Glucose: 106 mg/dL — ABNORMAL HIGH (ref 65–99)
Potassium: 4.6 mmol/L (ref 3.5–5.2)
SODIUM: 144 mmol/L (ref 134–144)

## 2015-02-17 ENCOUNTER — Ambulatory Visit (INDEPENDENT_AMBULATORY_CARE_PROVIDER_SITE_OTHER): Payer: PPO | Admitting: Pharmacist

## 2015-02-17 DIAGNOSIS — I4891 Unspecified atrial fibrillation: Secondary | ICD-10-CM

## 2015-02-17 LAB — POCT INR: INR: 2.9

## 2015-02-17 NOTE — Patient Instructions (Signed)
Anticoagulation Dose Instructions as of 02/17/2015      Tammie [name] Tue Wed Thu Fri Sat   New Dose 2.5 mg 5 mg 2.5 mg 2.5 mg 2.5 mg 5 mg 2.5 mg    Description        Continue current warfarin dose of 1 tablet mondays and fridays.  1/2 tablet all other days.      INR was 2.9 today

## 2015-02-18 ENCOUNTER — Ambulatory Visit (INDEPENDENT_AMBULATORY_CARE_PROVIDER_SITE_OTHER): Payer: PPO | Admitting: Thoracic Surgery (Cardiothoracic Vascular Surgery)

## 2015-02-18 ENCOUNTER — Encounter: Payer: Self-pay | Admitting: Thoracic Surgery (Cardiothoracic Vascular Surgery)

## 2015-02-18 ENCOUNTER — Ambulatory Visit
Admission: RE | Admit: 2015-02-18 | Discharge: 2015-02-18 | Disposition: A | Discharge status: Home / Self Care | Payer: PPO | Source: Ambulatory Visit | Attending: Thoracic Surgery (Cardiothoracic Vascular Surgery) | Admitting: Thoracic Surgery (Cardiothoracic Vascular Surgery)

## 2015-02-18 VITALS — BP 156/80 | HR 73 | Resp 20 | Ht 71.0 in | Wt 274.0 lb

## 2015-02-18 DIAGNOSIS — C3491 Malignant neoplasm of unspecified part of right bronchus or lung: Secondary | ICD-10-CM

## 2015-02-18 DIAGNOSIS — Z85118 Personal history of other malignant neoplasm of bronchus and lung: Secondary | ICD-10-CM

## 2015-02-18 DIAGNOSIS — Z853 Personal history of malignant neoplasm of breast: Secondary | ICD-10-CM

## 2015-02-18 DIAGNOSIS — Z09 Encounter for follow-up examination after completed treatment for conditions other than malignant neoplasm: Secondary | ICD-10-CM

## 2015-02-18 DIAGNOSIS — C3411 Malignant neoplasm of upper lobe, right bronchus or lung: Secondary | ICD-10-CM
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
## 2015-02-28 ENCOUNTER — Other Ambulatory Visit: Payer: Self-pay | Admitting: Pharmacist

## 2015-03-10 ENCOUNTER — Ambulatory Visit (INDEPENDENT_AMBULATORY_CARE_PROVIDER_SITE_OTHER): Payer: PPO | Admitting: Nurse Practitioner

## 2015-03-10 ENCOUNTER — Encounter: Payer: Self-pay | Admitting: Nurse Practitioner

## 2015-03-10 VITALS — BP 138/71 | HR 78 | Temp 98.1°F | Ht 71.0 in | Wt 275.0 lb

## 2015-03-10 DIAGNOSIS — I10 Essential (primary) hypertension: Secondary | ICD-10-CM

## 2015-03-10 DIAGNOSIS — R609 Edema, unspecified: Secondary | ICD-10-CM

## 2015-03-10 DIAGNOSIS — Z6838 Body mass index (BMI) 38.0-38.9, adult: Secondary | ICD-10-CM

## 2015-03-10 DIAGNOSIS — R600 Localized edema: Secondary | ICD-10-CM | POA: Insufficient documentation

## 2015-03-10 DIAGNOSIS — IMO0001 Reserved for inherently not codable concepts without codable children: Secondary | ICD-10-CM

## 2015-03-10 DIAGNOSIS — I4891 Unspecified atrial fibrillation: Secondary | ICD-10-CM

## 2015-03-10 DIAGNOSIS — E785 Hyperlipidemia, unspecified: Secondary | ICD-10-CM

## 2015-03-10 LAB — POCT INR: INR: 2.3

## 2015-03-10 MED ORDER — METOPROLOL TARTRATE 100 MG PO TABS: 150.0000 mg | ORAL_TABLET | Freq: Two times a day (BID) | ORAL | Status: DC

## 2015-03-10 MED ORDER — SIMVASTATIN 10 MG PO TABS: 10.0000 mg | ORAL_TABLET | Freq: Every day | ORAL | Status: DC

## 2015-03-10 MED ORDER — FUROSEMIDE 40 MG PO TABS: ORAL_TABLET | ORAL | Status: DC

## 2015-03-10 MED ORDER — WARFARIN SODIUM 5 MG PO TABS: 5.0000 mg | ORAL_TABLET | Freq: Every day | ORAL | Status: DC

## 2015-03-10 NOTE — Patient Instructions (Addendum)
Anticoagulation Dose Instructions as of 03/10/2015      Tammie [name] Tue Wed Thu Fri Sat   New Dose 2.5 mg 5 mg 2.5 mg 2.5 mg 2.5 mg 5 mg 2.5 mg    Description        Continue current warfarin dose of 1 tablet mondays and fridays.  1/2 tablet all other days.     Recheck in 4 weeks      Peripheral Edema You have swelling in your legs (peripheral edema). This swelling is due to excess accumulation of salt and water in your body. Edema may be a sign of heart, kidney or liver disease, or a side effect of a medication. It may also be due to problems in the leg veins. Elevating your legs and using special support stockings may be very helpful, if the cause of the swelling is due to poor venous circulation. Avoid long periods of standing, whatever the cause. Treatment of edema depends on identifying the cause. Chips, pretzels, pickles and other salty foods should be avoided. Restricting salt in your diet is almost always needed. Water pills (diuretics) are often used to remove the excess salt and water from your body via urine. These medicines prevent the kidney from reabsorbing sodium. This increases urine flow. Diuretic treatment may also result in lowering of potassium levels in your body. Potassium supplements may be needed if you have to use diuretics daily. Daily weights can help you keep track of your progress in clearing your edema. You should call your caregiver for follow up care as recommended. SEEK IMMEDIATE MEDICAL CARE IF:   You have increased swelling, pain, redness, or heat in your legs.  You develop shortness of breath, especially when lying down.  You develop chest or abdominal pain, weakness, or fainting.  You have a fever. Document Released: 01/20/2005 Document Revised: 03/06/2012 Document Reviewed: 12/31/2009 [facility] Patient Information 2015 [place], [place]. This information is not intended to replace advice given to you by your health care provider. Make sure you discuss any  questions you have with your health care provider.

## 2015-03-10 NOTE — Progress Notes (Signed)
Subjective:    Patient ID: Tammie [name], female    DOB: [birth year], 74 y.o.   MRN: [id number]  Hypertension This is a chronic problem. The current episode started more than 1 year ago. The problem is unchanged. The problem is controlled. Associated symptoms include anxiety. Pertinent negatives include no chest pain, headaches, neck pain, palpitations or shortness of breath. Risk factors for coronary artery disease include dyslipidemia, obesity, post-menopausal state and sedentary lifestyle. Past treatments include diuretics and beta blockers. The current treatment provides moderate improvement. Compliance problems include diet and exercise.   Hyperlipidemia This is a chronic problem. The current episode started more than 1 year ago. The problem is controlled. Recent lipid tests were reviewed and are normal. Exacerbating diseases include obesity. She has no history of diabetes or hypothyroidism. Pertinent negatives include no chest pain or shortness of breath. Current antihyperlipidemic treatment includes statins. The current treatment provides moderate improvement of lipids. Compliance problems include adherence to diet and adherence to exercise.  Risk factors for coronary artery disease include dyslipidemia, hypertension, obesity and post-menopausal.  atrial fib On anticoag therapy- no bleeding or bruising- denies SOB as well as lower extremity pain. COPD Uses albuterol on an as needed basis- hasn't needed much lately. Hx of breast and lung cancer Completed all treatments in NOvember- still losing hair and is very fatigued.      Review of Systems  Constitutional: Negative.   HENT: Negative.   Respiratory: Negative.  Negative for shortness of breath.   Cardiovascular: Negative.  Negative for chest pain and palpitations.  Genitourinary: Negative.   Musculoskeletal: Negative for neck pain.  Neurological: Negative.  Negative for headaches.  Psychiatric/Behavioral: Negative.   All other  systems reviewed and are negative.      Objective:   Physical Exam  Constitutional: She is oriented to person, place, and time. She appears well-developed and well-nourished.  HENT:  Nose: Nose normal.  Mouth/Throat: Oropharynx is clear and moist.  Eyes: EOM are normal.  Neck: Trachea normal, normal range of motion and full passive range of motion without pain. Neck supple. No JVD present. Carotid bruit is not present. No thyromegaly present.  Cardiovascular: Normal rate, regular rhythm, normal heart sounds and intact distal pulses.  Exam reveals no gallop and no friction rub.   No murmur heard. Pulmonary/Chest: Effort normal and breath sounds normal.  Abdominal: Soft. Bowel sounds are normal. She exhibits no distension and no mass. There is no tenderness.  Musculoskeletal: Normal range of motion. She exhibits edema (2+ edema bil).  Lymphadenopathy:    She has no cervical adenopathy.  Neurological: She is alert and oriented to person, place, and time. She has normal reflexes.  Skin: Skin is warm and dry.  Psychiatric: She has a normal mood and affect. Her behavior is normal. Judgment and thought content normal.   BP 138/71 mmHg  Pulse 78  Temp(Src) 98.1 F (36.7 C) (Oral)  Ht _0  (1.803 m)  Wt 275 lb (124.739 kg)  BMI 38.37 kg/m2        Assessment & Plan:  1. Essential hypertension o not add salt to diet - metoprolol (LOPRESSOR) 100 MG tablet; Take 1.5 tablets (150 mg total) by mouth 2 (two) times daily.  Dispense: 270 tablet; Refill: 1 - CMP14+EGFR  2. COPD bronchitis Avoid allergens  3. Hyperlipidemia with target LDL less than 100 Low fat diet - simvastatin (ZOCOR) 10 MG tablet; Take 1 tablet (10 mg total) by mouth daily.  Dispense: 90 tablet; Refill:  1 - NMR, lipoprofile  4. Atrial fibrillation - POCT INR - warfarin (COUMADIN) 5 MG tablet; Take 1 tablet (5 mg total) by mouth daily.  Dispense: 90 tablet; Refill: 1  5. Peripheral edema Elevate when  sitting Refuses compression hose - furosemide (LASIX) 40 MG tablet; TAKE ONE TABLET BY MOUTH ONCE DAILY IN THE MORNING FOR SWELLING (TAKE IN PLACE OF HCTZ)  Dispense: 30 tablet; Refill: 5  6. BMI 38.0-38.9,adult Discussed diet and exercise for person with BMI >25 Will recheck weight in 3-6 months    INR normal range- recheck in 4 weeks- continue current dose of coumadin Labs pending Health maintenance reviewed- refuses immunizations and testing Diet and exercise encouraged Continue all meds Follow up  In 3 months   Louisa, FNP

## 2015-03-17 ENCOUNTER — Other Ambulatory Visit (HOSPITAL_BASED_OUTPATIENT_CLINIC_OR_DEPARTMENT_OTHER): Payer: PPO

## 2015-03-17 ENCOUNTER — Ambulatory Visit (HOSPITAL_COMMUNITY): Payer: Medicare HMO

## 2015-03-17 DIAGNOSIS — C3491 Malignant neoplasm of unspecified part of right bronchus or lung: Secondary | ICD-10-CM

## 2015-03-17 DIAGNOSIS — C3411 Malignant neoplasm of upper lobe, right bronchus or lung: Secondary | ICD-10-CM

## 2015-03-17 LAB — CBC WITH DIFFERENTIAL/PLATELET
BASO%: 0.6 % (ref 0.0–2.0)
Basophils Absolute: 0 10*3/uL (ref 0.0–0.1)
EOS%: 2.9 % (ref 0.0–7.0)
Eosinophils Absolute: 0.2 10*3/uL (ref 0.0–0.5)
HCT: 34 % — ABNORMAL LOW (ref 34.8–46.6)
HGB: 11 g/dL — ABNORMAL LOW (ref 11.6–15.9)
LYMPH%: 21.1 % (ref 14.0–49.7)
MCH: 28.9 pg (ref 25.1–34.0)
MCHC: 32.4 g/dL (ref 31.5–36.0)
MCV: 89.5 fL (ref 79.5–101.0)
MONO#: 0.5 10*3/uL (ref 0.1–0.9)
MONO%: 8.3 % (ref 0.0–14.0)
NEUT%: 67.1 % (ref 38.4–76.8)
NEUTROS ABS: 4.2 10*3/uL (ref 1.5–6.5)
Platelets: 171 10*3/uL (ref 145–400)
RBC: 3.8 10*6/uL (ref 3.70–5.45)
RDW: 13.2 % (ref 11.2–14.5)
WBC: 6.3 10*3/uL (ref 3.9–10.3)
lymph#: 1.3 10*3/uL (ref 0.9–3.3)

## 2015-03-17 LAB — COMPREHENSIVE METABOLIC PANEL (CC13)
ALBUMIN: 3.5 g/dL (ref 3.5–5.0)
ALK PHOS: 52 U/L (ref 40–150)
ALT: 14 U/L (ref 0–55)
AST: 15 U/L (ref 5–34)
Anion Gap: 10 mEq/L (ref 3–11)
BUN: 39.4 mg/dL — ABNORMAL HIGH (ref 7.0–26.0)
CO2: 31 mEq/L — ABNORMAL HIGH (ref 22–29)
Calcium: 9.8 mg/dL (ref 8.4–10.4)
Chloride: 105 mEq/L (ref 98–109)
Creatinine: 2.1 mg/dL — ABNORMAL HIGH (ref 0.6–1.1)
EGFR: 23 mL/min/{1.73_m2} — ABNORMAL LOW (ref >90)
Glucose: 127 mg/dl (ref 70–140)
POTASSIUM: 4.7 meq/L (ref 3.5–5.1)
SODIUM: 145 meq/L (ref 136–145)
TOTAL PROTEIN: 6.6 g/dL (ref 6.4–8.3)
Total Bilirubin: 0.22 mg/dL (ref 0.20–1.20)

## 2015-03-24 ENCOUNTER — Encounter: Payer: Self-pay | Admitting: *Deleted

## 2015-03-24 ENCOUNTER — Other Ambulatory Visit (HOSPITAL_BASED_OUTPATIENT_CLINIC_OR_DEPARTMENT_OTHER): Payer: PPO

## 2015-03-24 ENCOUNTER — Ambulatory Visit (HOSPITAL_BASED_OUTPATIENT_CLINIC_OR_DEPARTMENT_OTHER): Payer: PPO | Admitting: Internal Medicine

## 2015-03-24 ENCOUNTER — Encounter: Payer: Self-pay | Admitting: Internal Medicine

## 2015-03-24 ENCOUNTER — Telehealth: Payer: Self-pay | Admitting: Internal Medicine

## 2015-03-24 VITALS — BP 123/99 | HR 70 | Temp 98.3°F | Resp 18 | Ht 71.0 in | Wt 274.7 lb

## 2015-03-24 DIAGNOSIS — C3431 Malignant neoplasm of lower lobe, right bronchus or lung: Secondary | ICD-10-CM

## 2015-03-24 DIAGNOSIS — C50411 Malignant neoplasm of upper-outer quadrant of right female breast: Secondary | ICD-10-CM

## 2015-03-24 DIAGNOSIS — N183 Chronic kidney disease, stage 3 unspecified: Secondary | ICD-10-CM | POA: Insufficient documentation

## 2015-03-24 DIAGNOSIS — Z853 Personal history of malignant neoplasm of breast: Secondary | ICD-10-CM | POA: Diagnosis not present

## 2015-03-24 DIAGNOSIS — N289 Disorder of kidney and ureter, unspecified: Secondary | ICD-10-CM | POA: Diagnosis not present

## 2015-03-24 DIAGNOSIS — T451X5A Adverse effect of antineoplastic and immunosuppressive drugs, initial encounter: Secondary | ICD-10-CM

## 2015-03-24 DIAGNOSIS — D6481 Anemia due to antineoplastic chemotherapy: Secondary | ICD-10-CM | POA: Insufficient documentation

## 2015-03-24 LAB — RESEARCH LABS

## 2015-03-24 LAB — COMPREHENSIVE METABOLIC PANEL (CC13)
ALK PHOS: 47 U/L (ref 40–150)
ALT: 16 U/L (ref 0–55)
AST: 19 U/L (ref 5–34)
Albumin: 3.6 g/dL (ref 3.5–5.0)
Anion Gap: 12 mEq/L — ABNORMAL HIGH (ref 3–11)
BILIRUBIN TOTAL: 0.27 mg/dL (ref 0.20–1.20)
BUN: 42.6 mg/dL — ABNORMAL HIGH (ref 7.0–26.0)
CO2: 30 mEq/L — ABNORMAL HIGH (ref 22–29)
Calcium: 10 mg/dL (ref 8.4–10.4)
Chloride: 104 mEq/L (ref 98–109)
Creatinine: 2.2 mg/dL — ABNORMAL HIGH (ref 0.6–1.1)
EGFR: 22 mL/min/{1.73_m2} — AB (ref >90)
GLUCOSE: 95 mg/dL (ref 70–140)
Potassium: 4.2 mEq/L (ref 3.5–5.1)
Sodium: 147 mEq/L — ABNORMAL HIGH (ref 136–145)
TOTAL PROTEIN: 6.6 g/dL (ref 6.4–8.3)

## 2015-03-24 NOTE — Telephone Encounter (Signed)
Pt confirmed labs/ov per 03/28 POF, gave pt AVS and calendar.... KJ
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
## 2015-03-28 ENCOUNTER — Telehealth: Payer: Self-pay | Admitting: Internal Medicine

## 2015-03-28 NOTE — Telephone Encounter (Signed)
Faxed pt medical records to [facility]

## 2015-04-02 ENCOUNTER — Other Ambulatory Visit: Payer: Self-pay | Admitting: Family Medicine

## 2015-04-02 DIAGNOSIS — I10 Essential (primary) hypertension: Secondary | ICD-10-CM

## 2015-04-02 DIAGNOSIS — E785 Hyperlipidemia, unspecified: Secondary | ICD-10-CM

## 2015-04-02 DIAGNOSIS — R609 Edema, unspecified: Secondary | ICD-10-CM

## 2015-04-03 MED ORDER — METOPROLOL TARTRATE 100 MG PO TABS: 150.0000 mg | ORAL_TABLET | Freq: Two times a day (BID) | ORAL | Status: DC

## 2015-04-03 MED ORDER — FUROSEMIDE 40 MG PO TABS: ORAL_TABLET | ORAL | Status: DC

## 2015-04-03 MED ORDER — WARFARIN SODIUM 5 MG PO TABS: 5.0000 mg | ORAL_TABLET | Freq: Every day | ORAL | Status: DC

## 2015-04-03 MED ORDER — SIMVASTATIN 10 MG PO TABS: 10.0000 mg | ORAL_TABLET | Freq: Every day | ORAL | Status: DC

## 2015-04-03 NOTE — Telephone Encounter (Signed)
done

## 2015-04-10 ENCOUNTER — Ambulatory Visit (INDEPENDENT_AMBULATORY_CARE_PROVIDER_SITE_OTHER): Payer: PPO | Admitting: Pharmacist

## 2015-04-10 DIAGNOSIS — I4891 Unspecified atrial fibrillation: Secondary | ICD-10-CM

## 2015-04-10 LAB — POCT INR: INR: 3.3

## 2015-04-10 NOTE — Patient Instructions (Signed)
Anticoagulation Dose Instructions as of 04/10/2015      [name] Tue Wed Thu Fri Sat   New Dose 2.5 mg 5 mg 2.5 mg 2.5 mg 2.5 mg 2.5 mg 2.5 mg    Description        Hold today's dose (4/14). Decrease current warfarin dose to 1 tablet on mondays and 1/2 tablet all other days.

## 2015-04-28 IMAGING — US US BREAST*R*
1 series · 11 of 11 positions shown · non-contrast
Comparison: Prior mammograms from 3373 and 1895 which can only be
viewed for comparison on a CD.

ADDENDUM:
The patient's outside images were sent to the [REDACTED] and
uploaded into PACS (these were unable to be uploaded at [REDACTED] at the time of the patient's diagnostic exam).

After review of the outside images, the distortion in the
upper-outer right breast was not definitely present on the prior
exams, and therefore biopsy is warranted. This is scheduled for
A phone call was made to the patient.
4: Suspicious abnormality - biopsy should be considered.
CLINICAL DATA: 70-year-old female with reported history of a prior
infection in the lower inner right which the patient states is near
resolved after a course of antibiotics. The patient reports a
history of remote benign bilateral excisional breast biopsies.
EXAM:
DIGITAL DIAGNOSTIC  BILATERAL MAMMOGRAM
RIGHT BREAST ULTRASOUND

[Series 1: us breast*right* · 0.06mm/px · 11 of 11 slices shown]
[im 1/11]
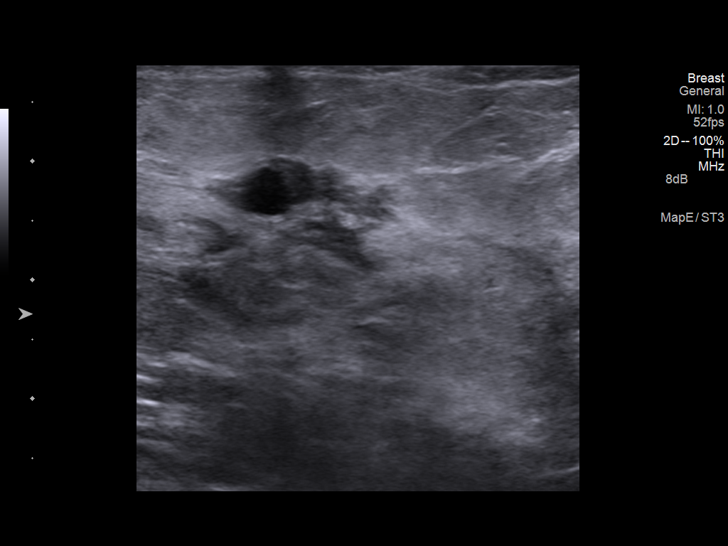
[im 2/11]
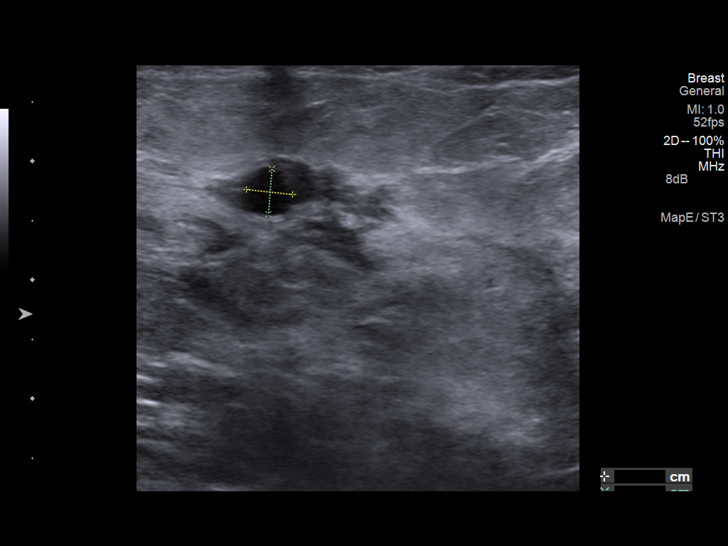
[im 3/11]
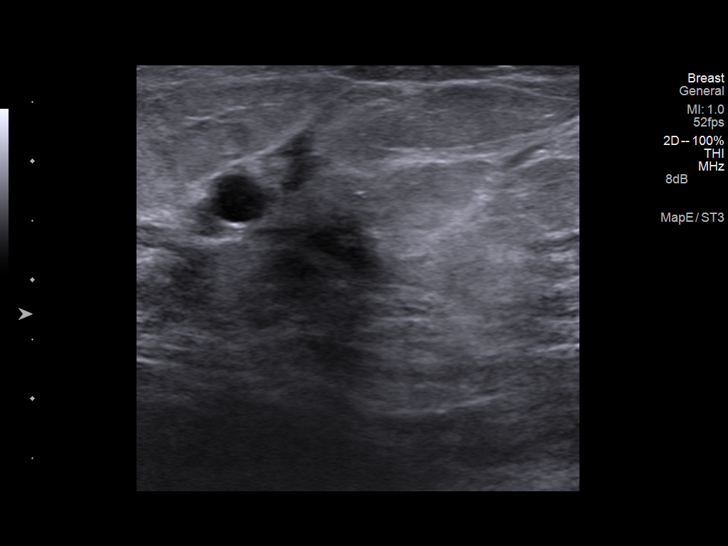
[im 4/11]
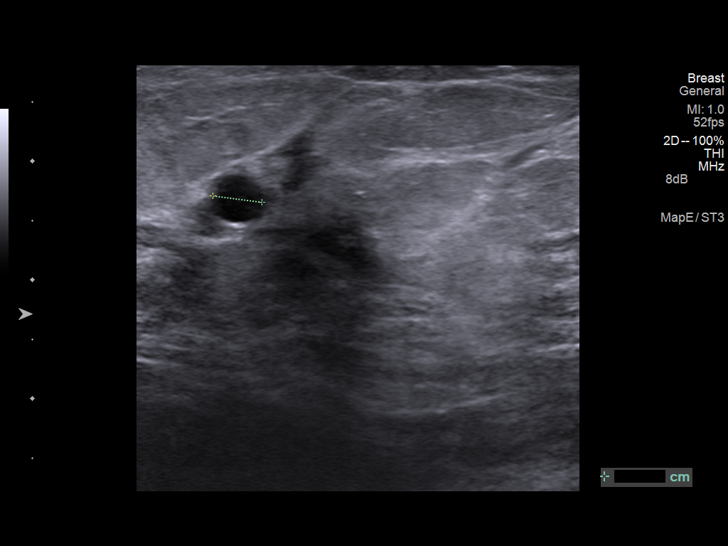
[im 5/11]
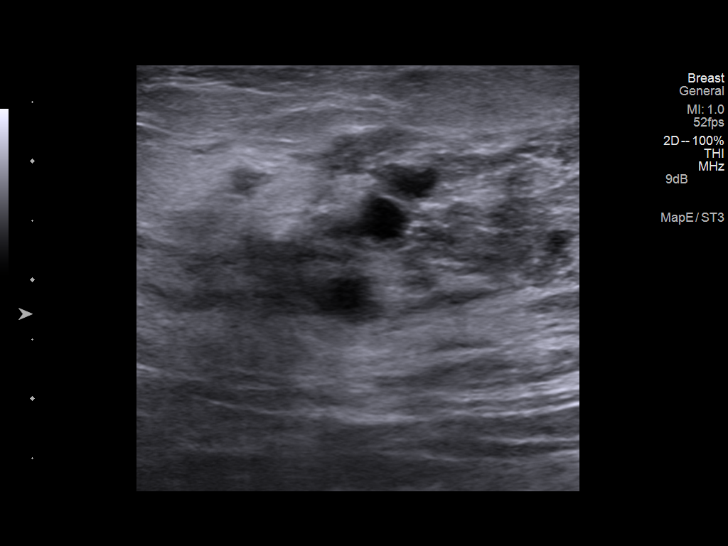
[im 6/11]
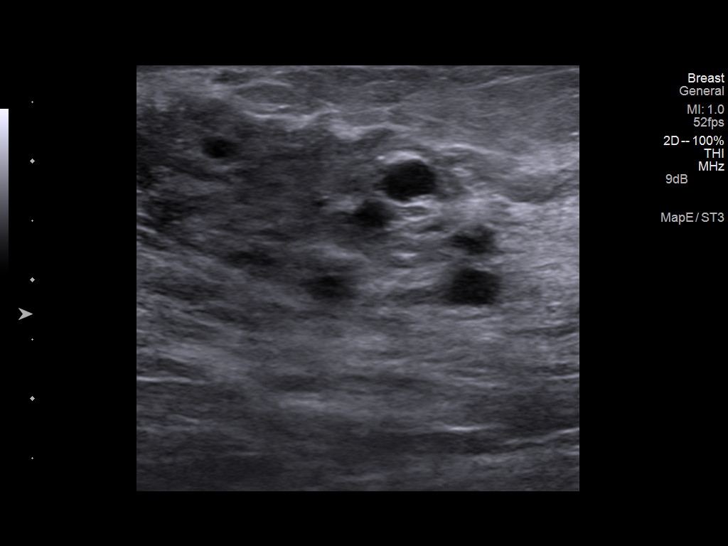
[im 7/11]
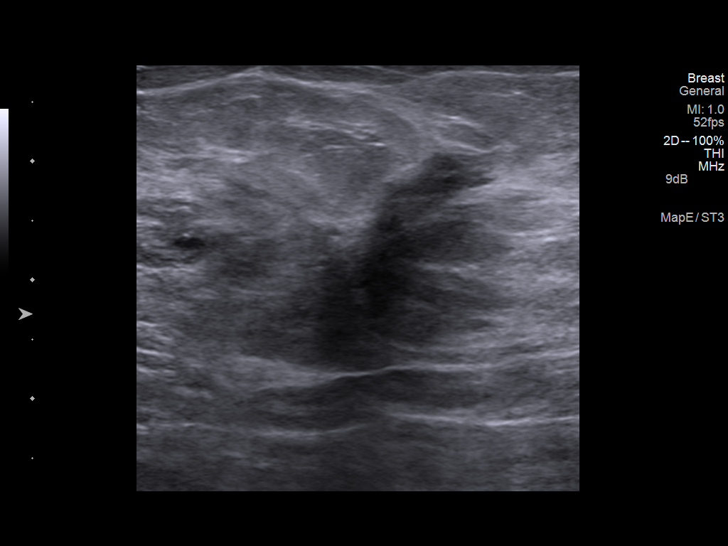
[im 8/11]
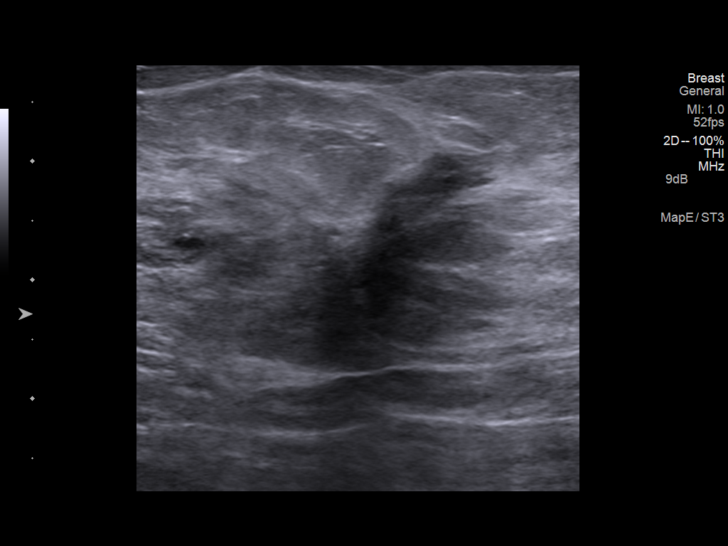
[im 9/11]
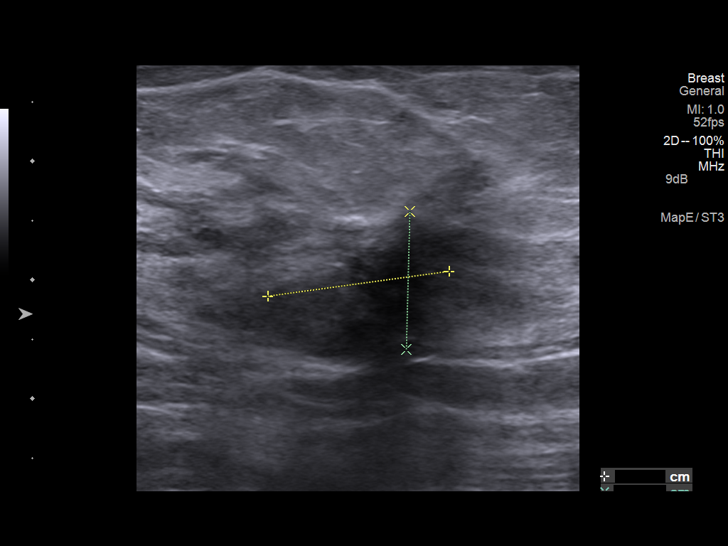
[im 10/11]
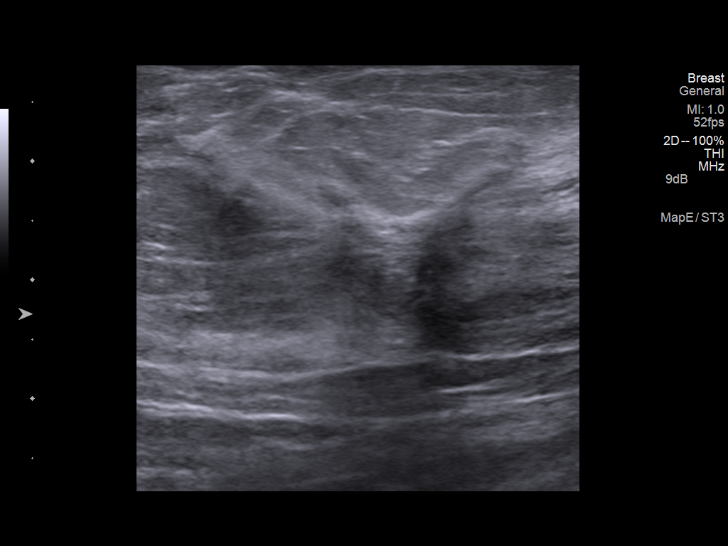
[im 11/11]
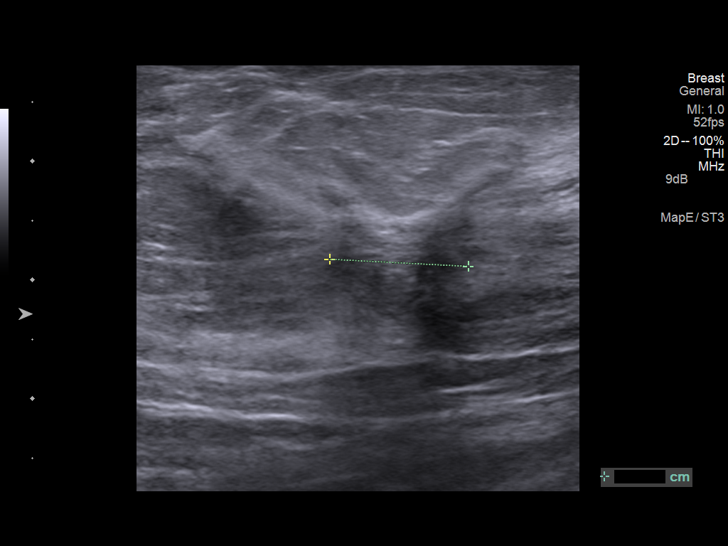

[11 of 11 positions shown; findings below may reference images not displayed]

ACR Breast Density Category c: The breasts are heterogeneously
dense, which may obscure small masses.
FINDINGS: There is architectural distortion in the upper-outer right breast
which is confirmed on the additional CC and MLO spot compression
views and appears overall similar when compared to the prior exams.
There are no definite mammographic abnormalities seen at the site of
concern in the right breast at reported site of prior infection.
There is no mammographic evidence of malignancy in the left breast.

Mammographic images were processed with CAD.

Physical exam of the upper-outer right breast as well as the lower
inner right breast does not reveal any palpable abnormalities. There
is a 6-7 cm linear incisional scar seen on the skin of the upper
outer right breast. There is a very mild erythema in the inner right
breast.

Targeted ultrasound of the right breast was performed. No hypoechoic
collections are seen in the inner right breast to suggest an
abscess. Innumerable cysts were seen, with a larger cyst at 3
o'clock 1 cm from the nipple measuring 0.4 x 0.4 x 0.4 cm.

The entire upper outer right breast was scanned. Innumerable cysts
are seen in the right breast at 9-11 o'clock. There is a vague and
non mass-like hypoechoic area in the right breast at 9 o'clock 4 cm
from the nipple just beneath the scar in the upper-outer right
breast consistent with a postsurgical scar.
IMPRESSION: 1. No mammographic or sonographic abnormality seen in the inner
right breast at site of reported resolving infection. Multiple cysts
are present in this location, however no findings to suggest an
abscess.

2. Right breast architectural distortion directly at the site of a
postsurgical scar, with the mammographic appearance similar to the
prior exams (although comparison limited due to images being on a
CD). Findings are consistent with postoperative changes.

3.  No mammographic evidence of malignancy in either breast.

RECOMMENDATION:
Screening mammogram in one year.(Code:YK-9-Y48)

I have discussed the findings and recommendations with the patient.
Results were also provided in writing at the conclusion of the
visit. If applicable, a reminder letter will be sent to the patient
regarding the next appointment.

BI-RADS CATEGORY  2: Benign Finding(s)

## 2015-04-28 IMAGING — MG MM DIGITAL DIAGNOSTIC BILAT
6 series · 6 of 6 positions shown · non-contrast
Comparison: Prior mammograms from 3373 and 1895 which can only be
viewed for comparison on a CD.

ADDENDUM:
The patient's outside images were sent to the [REDACTED] and
uploaded into PACS (these were unable to be uploaded at [REDACTED] at the time of the patient's diagnostic exam).

After review of the outside images, the distortion in the
upper-outer right breast was not definitely present on the prior
exams, and therefore biopsy is warranted. This is scheduled for
A phone call was made to the patient.
4: Suspicious abnormality - biopsy should be considered.
CLINICAL DATA: 70-year-old female with reported history of a prior
infection in the lower inner right which the patient states is near
resolved after a course of antibiotics. The patient reports a
history of remote benign bilateral excisional breast biopsies.
EXAM:
DIGITAL DIAGNOSTIC  BILATERAL MAMMOGRAM
RIGHT BREAST ULTRASOUND

[L MLO]
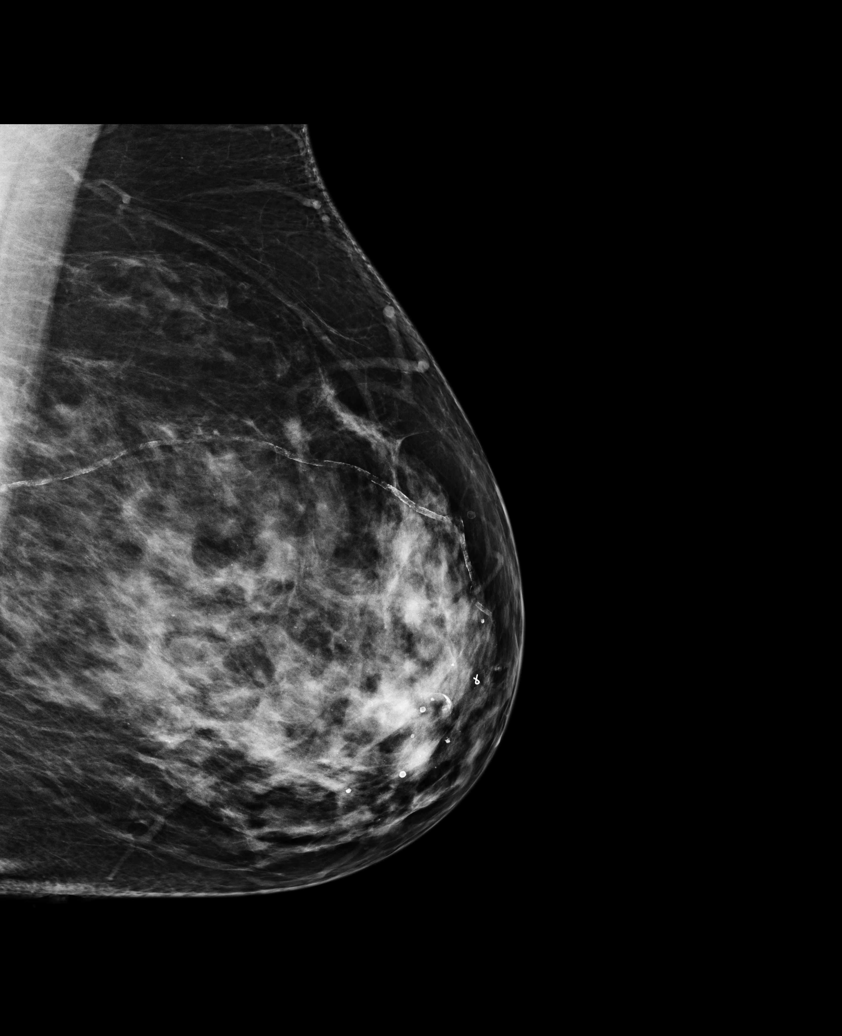

[R CC (1 of 2)]
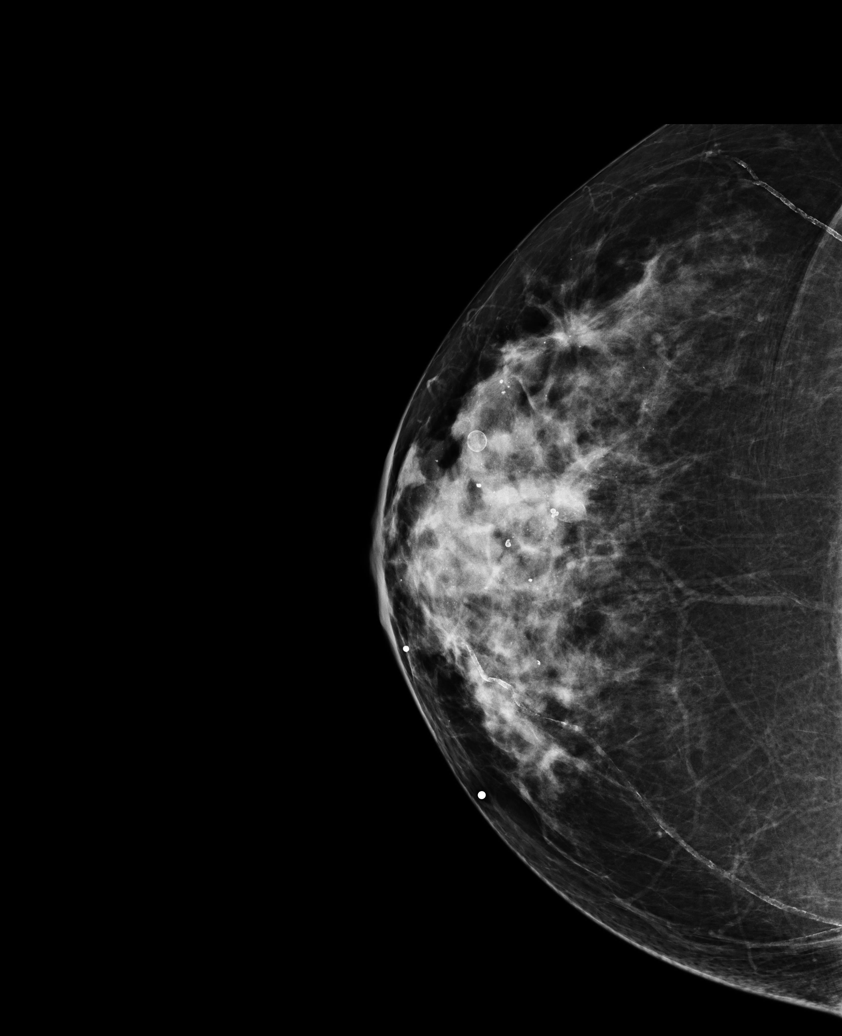

[R MLO (1 of 2)]
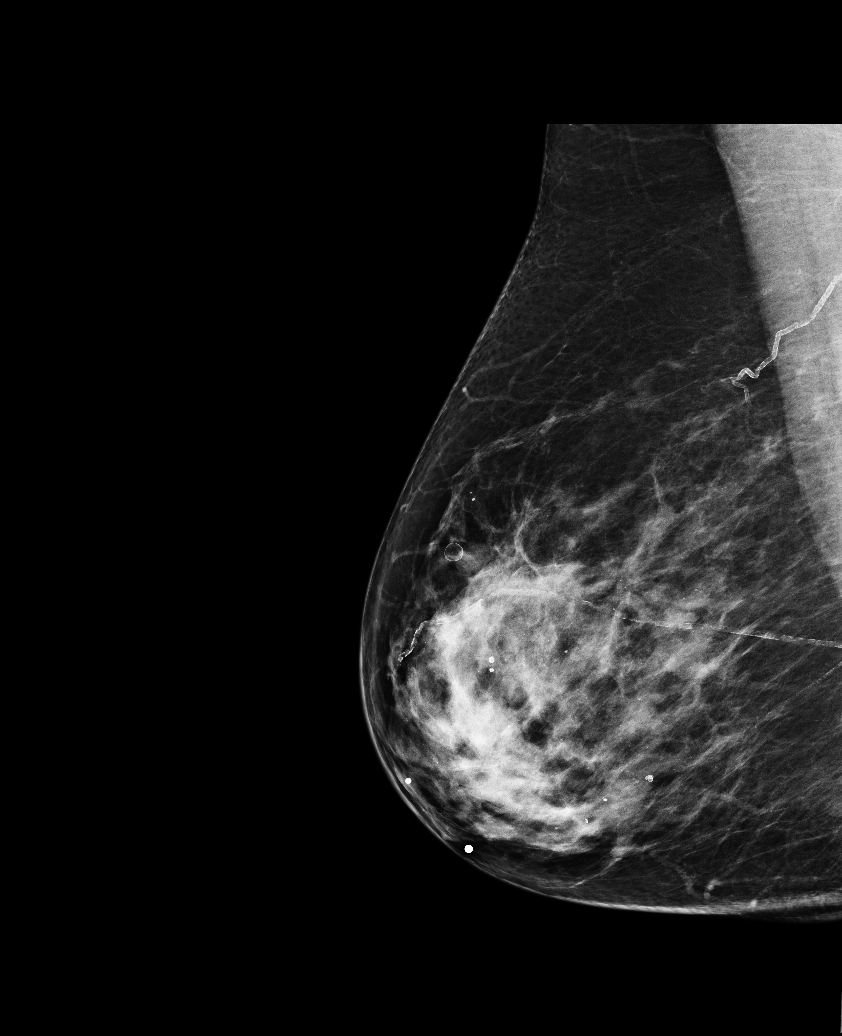

[R TAN]
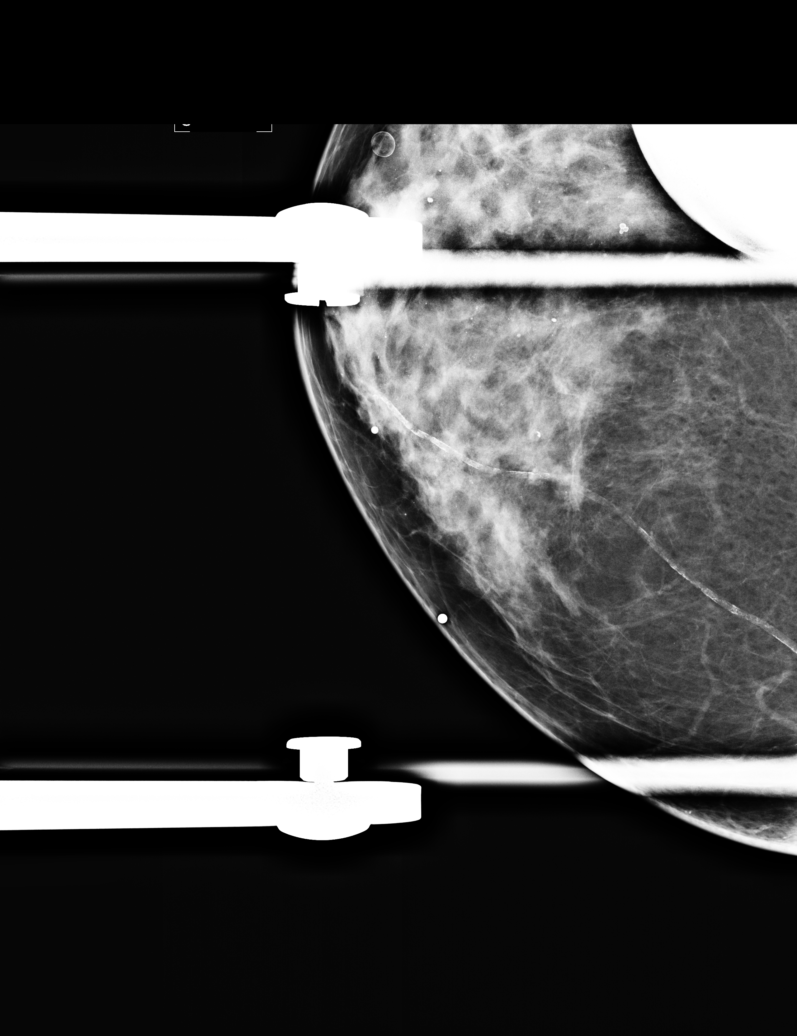

[R CC (2 of 2)]
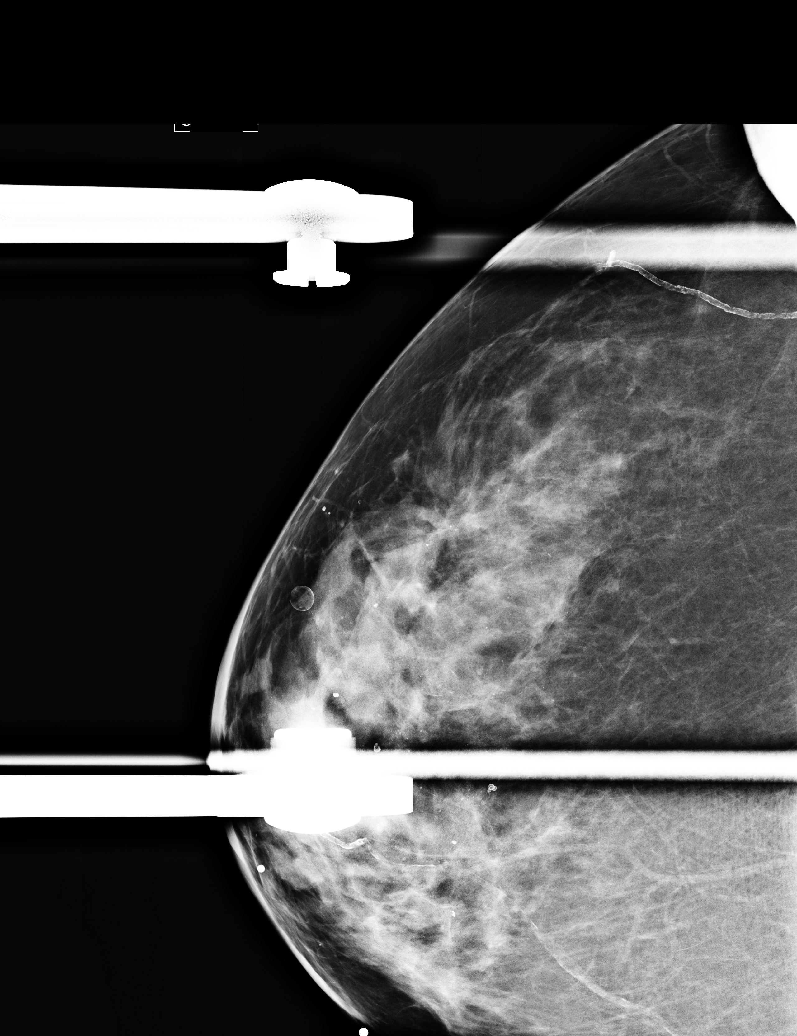

[R MLO (2 of 2)]
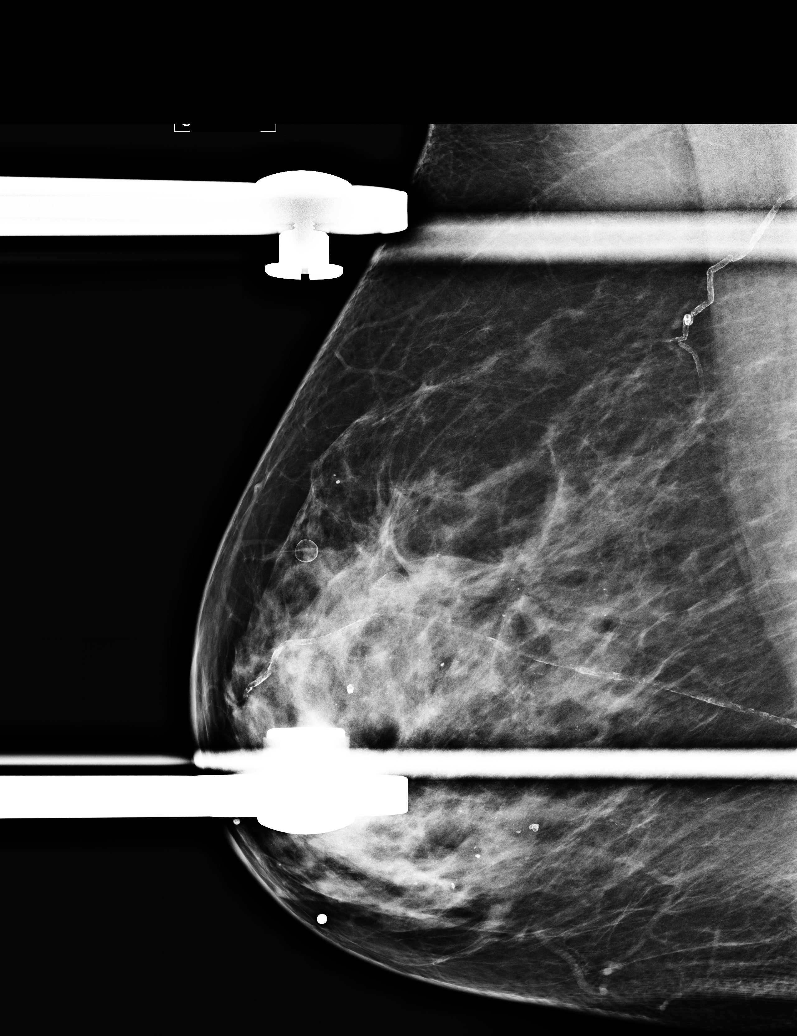

[6 of 6 positions shown; findings below may reference images not displayed]

ACR Breast Density Category c: The breasts are heterogeneously
dense, which may obscure small masses.
FINDINGS: There is architectural distortion in the upper-outer right breast
which is confirmed on the additional CC and MLO spot compression
views and appears overall similar when compared to the prior exams.
There are no definite mammographic abnormalities seen at the site of
concern in the right breast at reported site of prior infection.
There is no mammographic evidence of malignancy in the left breast.

Mammographic images were processed with CAD.

Physical exam of the upper-outer right breast as well as the lower
inner right breast does not reveal any palpable abnormalities. There
is a 6-7 cm linear incisional scar seen on the skin of the upper
outer right breast. There is a very mild erythema in the inner right
breast.

Targeted ultrasound of the right breast was performed. No hypoechoic
collections are seen in the inner right breast to suggest an
abscess. Innumerable cysts were seen, with a larger cyst at 3
o'clock 1 cm from the nipple measuring 0.4 x 0.4 x 0.4 cm.

The entire upper outer right breast was scanned. Innumerable cysts
are seen in the right breast at 9-11 o'clock. There is a vague and
non mass-like hypoechoic area in the right breast at 9 o'clock 4 cm
from the nipple just beneath the scar in the upper-outer right
breast consistent with a postsurgical scar.
IMPRESSION: 1. No mammographic or sonographic abnormality seen in the inner
right breast at site of reported resolving infection. Multiple cysts
are present in this location, however no findings to suggest an
abscess.

2. Right breast architectural distortion directly at the site of a
postsurgical scar, with the mammographic appearance similar to the
prior exams (although comparison limited due to images being on a
CD). Findings are consistent with postoperative changes.

3.  No mammographic evidence of malignancy in either breast.

RECOMMENDATION:
Screening mammogram in one year.(Code:YK-9-Y48)

I have discussed the findings and recommendations with the patient.
Results were also provided in writing at the conclusion of the
visit. If applicable, a reminder letter will be sent to the patient
regarding the next appointment.

BI-RADS CATEGORY  2: Benign Finding(s)

## 2015-05-08 ENCOUNTER — Encounter: Payer: Self-pay | Admitting: Nurse Practitioner

## 2015-05-08 ENCOUNTER — Ambulatory Visit (INDEPENDENT_AMBULATORY_CARE_PROVIDER_SITE_OTHER): Payer: PPO

## 2015-05-08 ENCOUNTER — Ambulatory Visit (INDEPENDENT_AMBULATORY_CARE_PROVIDER_SITE_OTHER): Payer: PPO | Admitting: Pharmacist

## 2015-05-08 ENCOUNTER — Telehealth: Payer: Self-pay | Admitting: Family Medicine

## 2015-05-08 ENCOUNTER — Ambulatory Visit (INDEPENDENT_AMBULATORY_CARE_PROVIDER_SITE_OTHER): Payer: PPO | Admitting: Nurse Practitioner

## 2015-05-08 VITALS — BP 137/80 | HR 92 | Temp 98.2°F | Ht 71.0 in | Wt 277.4 lb

## 2015-05-08 DIAGNOSIS — J209 Acute bronchitis, unspecified: Secondary | ICD-10-CM | POA: Diagnosis not present

## 2015-05-08 DIAGNOSIS — I4891 Unspecified atrial fibrillation: Secondary | ICD-10-CM | POA: Diagnosis not present

## 2015-05-08 DIAGNOSIS — R0602 Shortness of breath: Secondary | ICD-10-CM

## 2015-05-08 DIAGNOSIS — C3431 Malignant neoplasm of lower lobe, right bronchus or lung: Secondary | ICD-10-CM

## 2015-05-08 DIAGNOSIS — R609 Edema, unspecified: Secondary | ICD-10-CM

## 2015-05-08 LAB — POCT INR: INR: 2.9

## 2015-05-08 MED ORDER — FUROSEMIDE 40 MG PO TABS: 40.0000 mg | ORAL_TABLET | Freq: Two times a day (BID) | ORAL | Status: DC

## 2015-05-08 MED ORDER — AMOXICILLIN 875 MG PO TABS: 875.0000 mg | ORAL_TABLET | Freq: Two times a day (BID) | ORAL | Status: DC

## 2015-05-08 MED ORDER — LEVALBUTEROL HCL 1.25 MG/3ML IN NEBU
1.2500 mg | INHALATION_SOLUTION | RESPIRATORY_TRACT | Status: AC
  Administered 2015-05-08: 1.25 mg via RESPIRATORY_TRACT

## 2015-05-08 MED ORDER — AZITHROMYCIN 250 MG PO TABS: ORAL_TABLET | ORAL | Status: DC

## 2015-05-08 MED ORDER — METHYLPREDNISOLONE ACETATE 80 MG/ML IJ SUSP
80.0000 mg | Freq: Once | INTRAMUSCULAR | Status: AC
  Administered 2015-05-08: 80 mg via INTRAMUSCULAR

## 2015-05-08 MED ORDER — PREDNISONE 20 MG PO TABS: ORAL_TABLET | ORAL | Status: DC

## 2015-05-08 NOTE — Patient Instructions (Signed)

## 2015-05-08 NOTE — Progress Notes (Signed)
Subjective:    Patient ID: Tammie [name], female    DOB: 05-11-1941, 74 y.o.   MRN: [id number]  HPI Patient was in today to see clinical pharmacist to have INR checked and she was SOB- Started 3-4 days ago- She has lung cancer but is currently not taking any treatments. Has had chemo in past  And had part of right and left lungs removed in the past. Had to use her O2 all day yesterday.     Review of Systems  Constitutional: Negative.   HENT: Negative.   Respiratory: Positive for cough, shortness of breath and wheezing.   Cardiovascular: Negative.   Gastrointestinal: Negative.   Genitourinary: Negative.   Neurological: Negative.   Psychiatric/Behavioral: Negative.   All other systems reviewed and are negative.      Objective:   Physical Exam  Constitutional: She is oriented to person, place, and time. She appears well-developed and well-nourished.  Cardiovascular: Normal rate, regular rhythm and normal heart sounds.   Pulmonary/Chest: She is in respiratory distress. She has wheezes (insp and expiratory).  Dry tight cough  Neurological: She is alert and oriented to person, place, and time.  Skin: Skin is warm and dry.  Psychiatric: She has a normal mood and affect. Her behavior is normal. Judgment and thought content normal.   BP 137/80 mmHg  Pulse 92  Temp(Src) 98.2 F (36.8 C) (Oral)  Ht '5\' 11"'$  (1.803 m)  Wt 277 lb 6.4 oz (125.828 kg)  BMI 38.71 kg/m2  SpO2 90%  Chest x ray- nodule left lung- no ch for infiltrates-Preliminary reading by Ronnald Collum, FNP  [facility]  S/P nebulizer- xopenex- looser cough with exp wheezes only-Mary-Margaret Hassell Done, FNP       Assessment & Plan:   1. SOB (shortness of breath)   2. Acute bronchitis, unspecified organism   3. 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    Meds ordered this encounter  Medications  . levalbuterol (XOPENEX) nebulizer solution 1.25 mg    Sig:   . methylPREDNISolone acetate (DEPO-MEDROL) injection 80 mg    Sig:   . DISCONTD: azithromycin (ZITHROMAX Z-PAK) 250 MG tablet    Sig: As directed    Dispense:  1 each    Refill:  0    Order Specific Question:  Supervising Provider    Answer:  Chipper Herb [[id number]]  . predniSONE (DELTASONE) 20 MG tablet    Sig: 2 po at sametime daily for 5 days- start tomorrow    Dispense:  10 tablet    Refill:  0    Order Specific Question:  Supervising Provider    Answer:  Chipper Herb [[id number]]  . amoxicillin (AMOXIL) 875 MG tablet    Sig: Take 1 tablet (875 mg total) by mouth 2 (two) times daily. 1 po BID    Dispense:  20 tablet    Refill:  0    Order Specific Question:  Supervising Provider    Answer:  Chipper Herb [[id number]]   1. Take meds as prescribed 2. Use a cool mist humidifier especially during the winter months and when heat has been humid. 3. Use saline nose sprays frequently 4. Saline irrigations of the nose can be very helpful if done frequently.  * 4X daily for 1 week*  * Use of a nettie pot can be helpful with this. Follow directions with this* 5. Drink plenty of fluids 6. Keep thermostat turn down low 7.For any cough or congestion  Use plain  Mucinex- regular strength or max strength is fine   * Children- consult with Pharmacist for dosing 8. For fever or aces or pains- take tylenol or ibuprofen appropriate for age and weight.  * for fevers greater than 101 orally you may alternate ibuprofen and tylenol every  3 hours.   Mary-Margaret Hassell Done, FNP

## 2015-05-08 NOTE — Telephone Encounter (Signed)
Ok to pull tooth but should hold coumadin for at least 5 days prior and can restart once tooth is out.

## 2015-05-08 NOTE — Patient Instructions (Signed)
Anticoagulation Dose Instructions as of 05/08/2015      [name] Tue Wed Thu Fri Sat   New Dose 2.5 mg 5 mg 2.5 mg 2.5 mg 2.5 mg 2.5 mg 2.5 mg    Description        Continue current warfarin dose to 1 tablet on mondays and 1/2 tablet all other days.      INR was 2.9 today

## 2015-05-08 NOTE — Progress Notes (Signed)
Patient was SOB with cough x4 days.  Triages to see provider for evaluation.

## 2015-05-09 NOTE — Telephone Encounter (Signed)
Patient aware that letter has been faxed and she is aware that she will need to hold coumadin for 5 days and then restart on the day after the procedure.

## 2015-05-10 IMAGING — CT CT CHEST W/O CM
2 of 4 series · 15 of 36 positions shown, 18 images · non-contrast
Comparison: 04/24/2013 and baseline examination of 08/20/2010.

CLINICAL DATA: Followup lung cancer.

EXAM:
CT CHEST WITHOUT CONTRAST
TECHNIQUE: Multidetector CT imaging of the chest was performed following the
standard protocol without IV contrast.

[Series 2: chest w/o st · axial · non-contrast · 0.74mm/px · z∈[-196,+84]mm · 12 of 66 slices shown, 15 images]
[im 5/66  mediastinal]
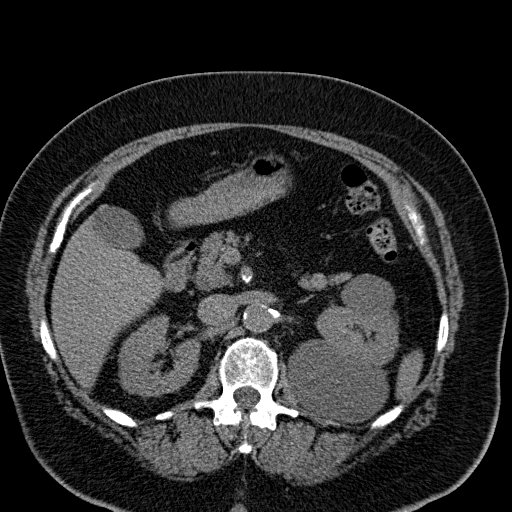
[im 5/66  lung]
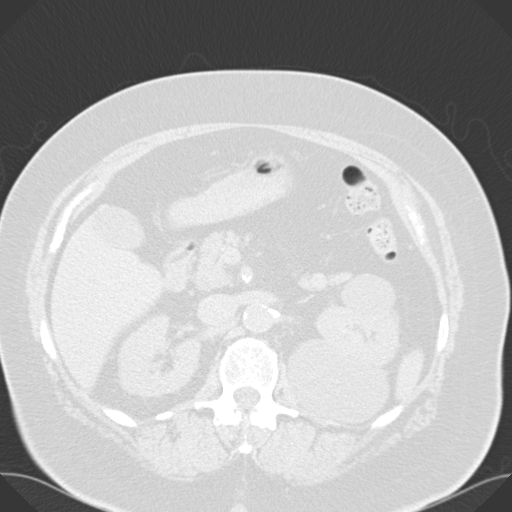
[im 10/66  lung]
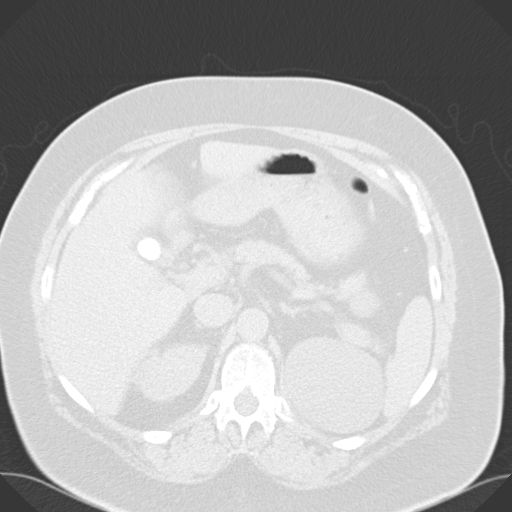
[im 14/66  lung]
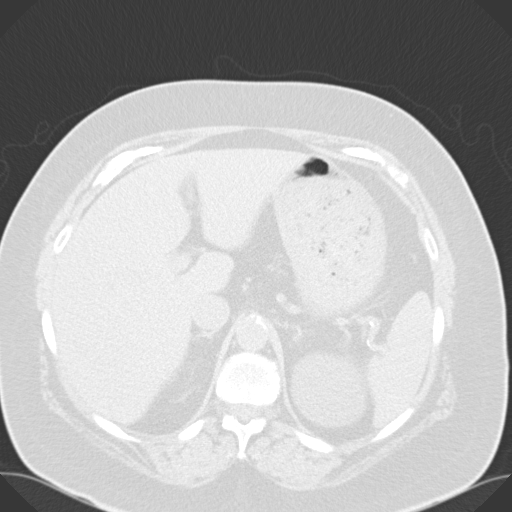
[im 19/66  lung]
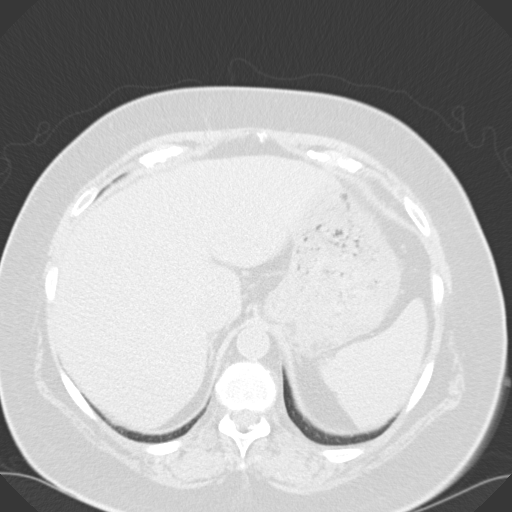
[im 24/66  mediastinal]
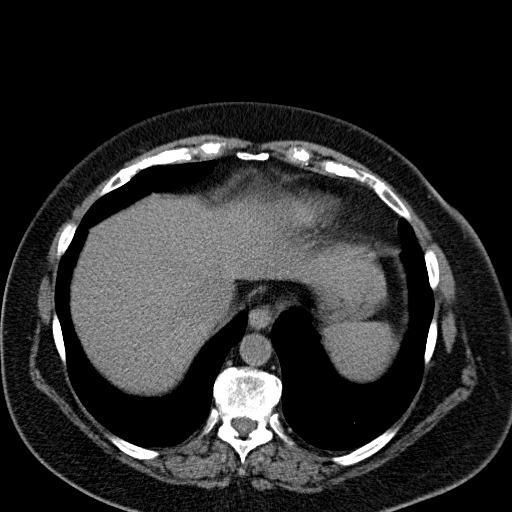
[im 24/66  lung]
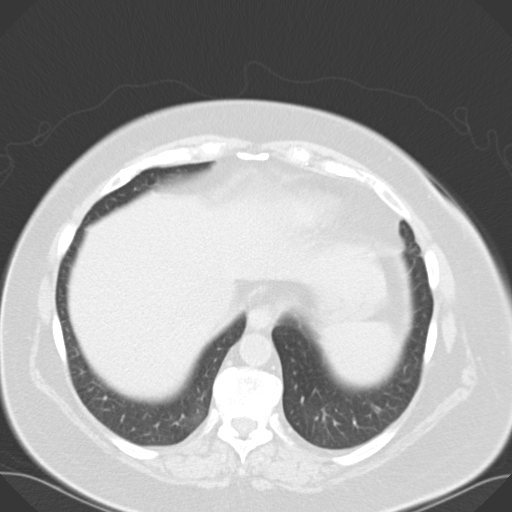
[im 28/66  lung]
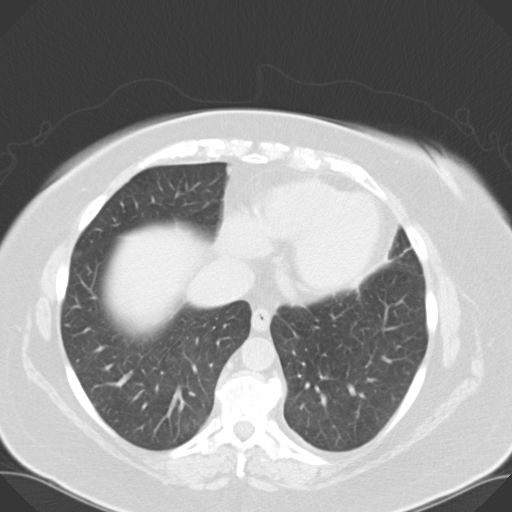
[im 38/66  lung]
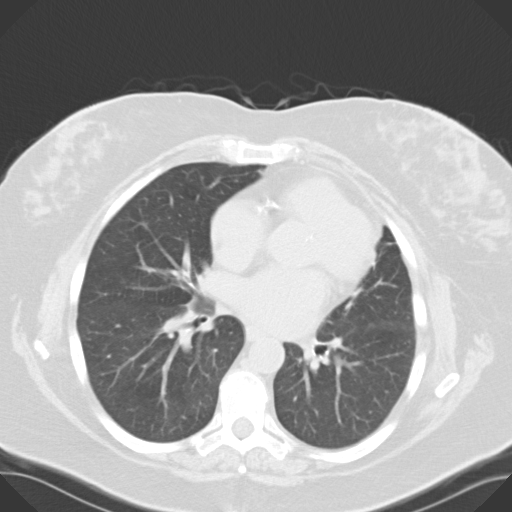
[im 42/66  lung]
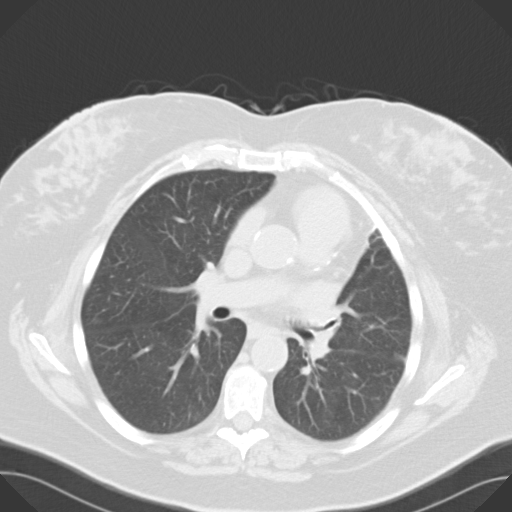
[im 47/66  mediastinal]
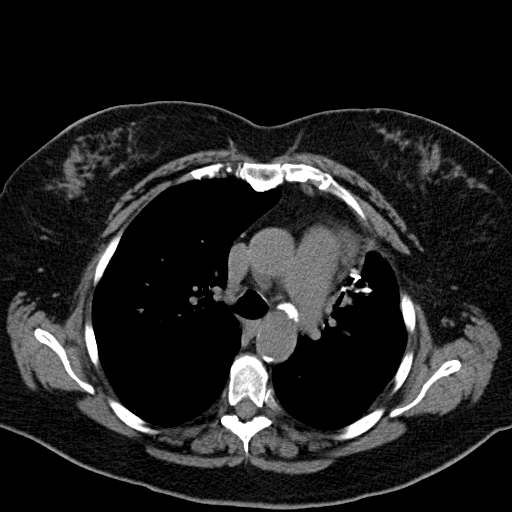
[im 47/66  lung]
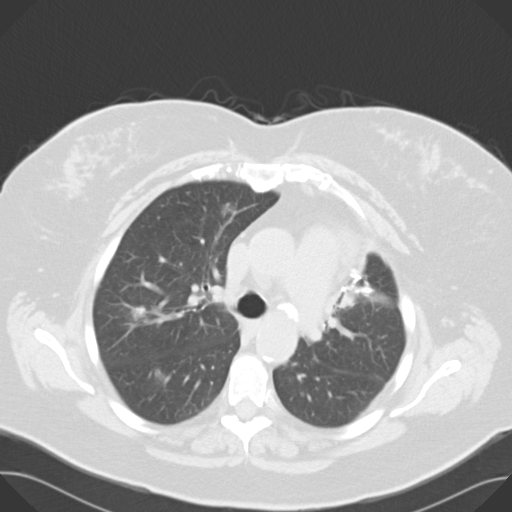
[im 52/66  lung]
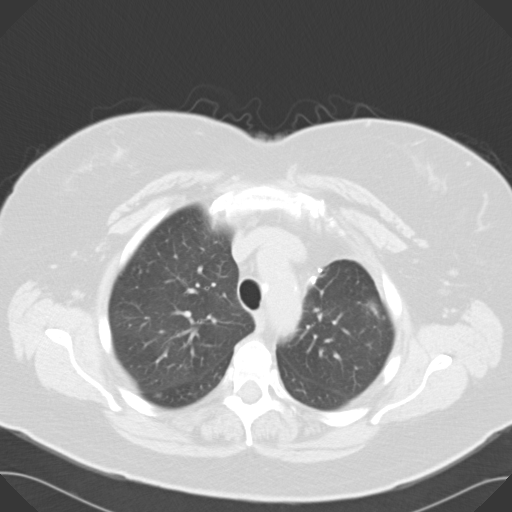
[im 56/66  lung]
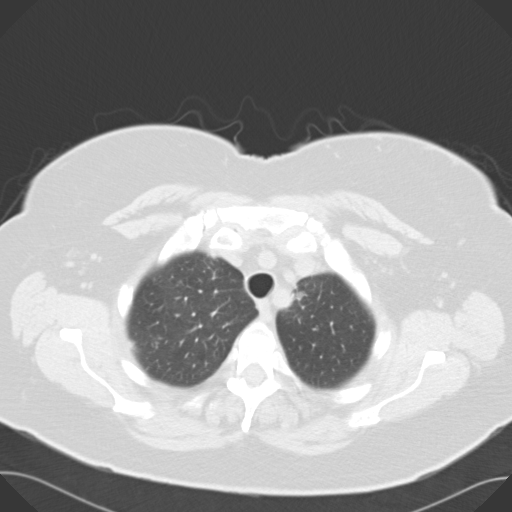
[im 61/66  lung]
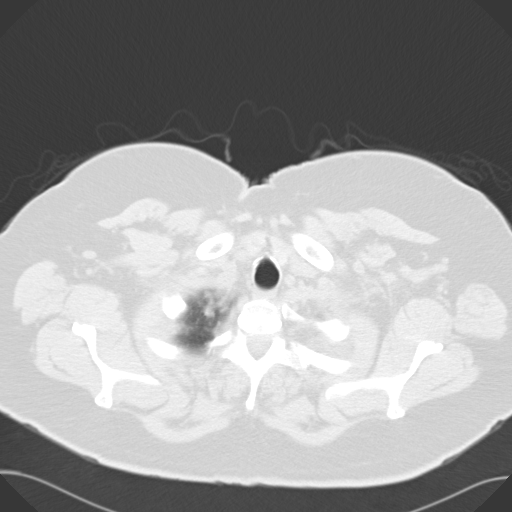

[Series 602: cor · coronal · 0.74mm/px · 3 of 93 slices shown]
[im 19/93  lung]
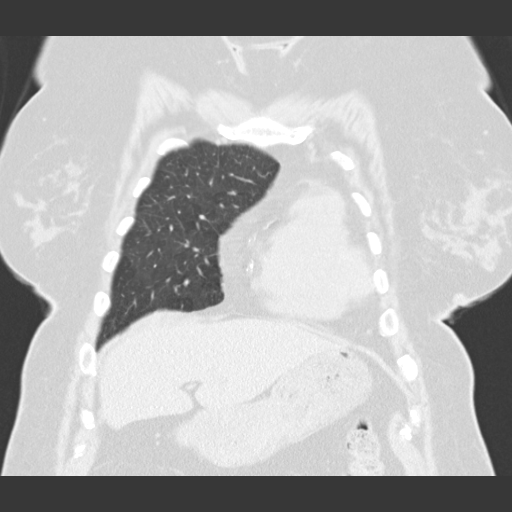
[im 37/93  lung]
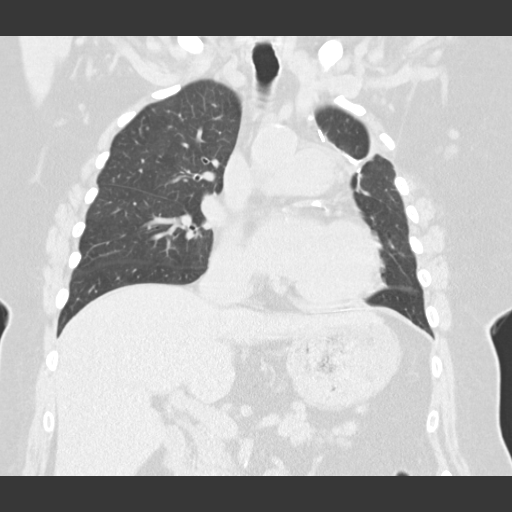
[im 56/93  lung]
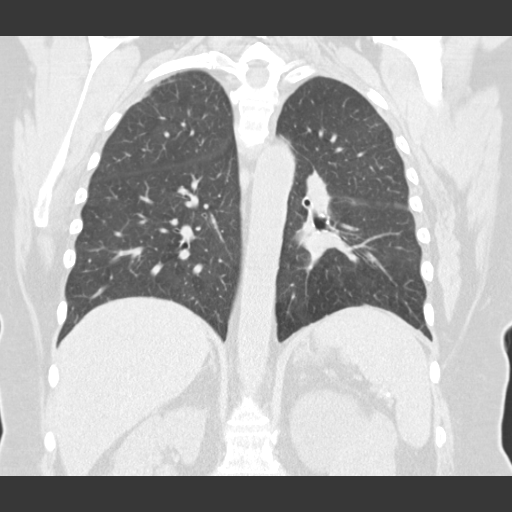

[15 of 36 positions shown; findings below may reference images not displayed]

FINDINGS: No pathologically enlarged mediastinal, hilar or axillary lymph
nodes. Pulmonary arteries are borderline enlarged. Atherosclerotic
calcification of the arterial vasculature, including coronary
arteries. Heart is mildly enlarged. No pericardial effusion. Pre
pericardiac lymph node is subcentimeter in short axis size.

Biapical pleural parenchymal scarring. Postoperative changes in the
left hemi thorax with associated scarring and volume loss. There are
scattered solid and ground-glass nodules bilaterally, right greater
than left. Index solid nodule in the right upper lobe measures 9 x
12 mm (image 18), unchanged when slight differences in measurement
technique are considered. Largest ground-glass nodule is seen in the
superior segment right lower lobe, measuring 1.4 x 1.6 cm, also
unchanged when slight differences in measurement technique are
considered. When compared with baseline examination of 08/20/2010,
slight interval progression in size is noted. No pleural fluid.
Airway is unremarkable.

Incidental imaging of the upper abdomen shows a 2.2 cm stone in the
gallbladder. A 6 mm stone is seen in the right kidney.
Low-attenuation lesions off the left kidney measure up to 8.2 cm, as
before. No worrisome lytic or sclerotic lesions. Degenerative
changes are seen in the spine.
IMPRESSION: 1. Bilateral pulmonary nodules, many of which are ground-glass in
attenuation, are unchanged from 04/24/2013 but appear slightly
larger than on baseline examination of 08/20/2010. Low-grade
adenocarcinoma can have this appearance.
2. Borderline enlarged pulmonary arteries, indicative of pulmonary
arterial hypertension.
3. Coronary artery calcification.
4. Cholelithiasis.
5. Right nephrolithiasis.

## 2015-05-26 IMAGING — MG MM DIGITAL DIAGNOSTIC UNILAT R
1 series · 1 of 1 positions shown · non-contrast
Comparison: Previous exams.

CLINICAL DATA: Status post ultrasound-guided core needle biopsy
right breast architectural distortion.

EXAM:
DIGITAL DIAGNOSTIC UNILATERAL RIGHT MAMMOGRAM

[R ML]
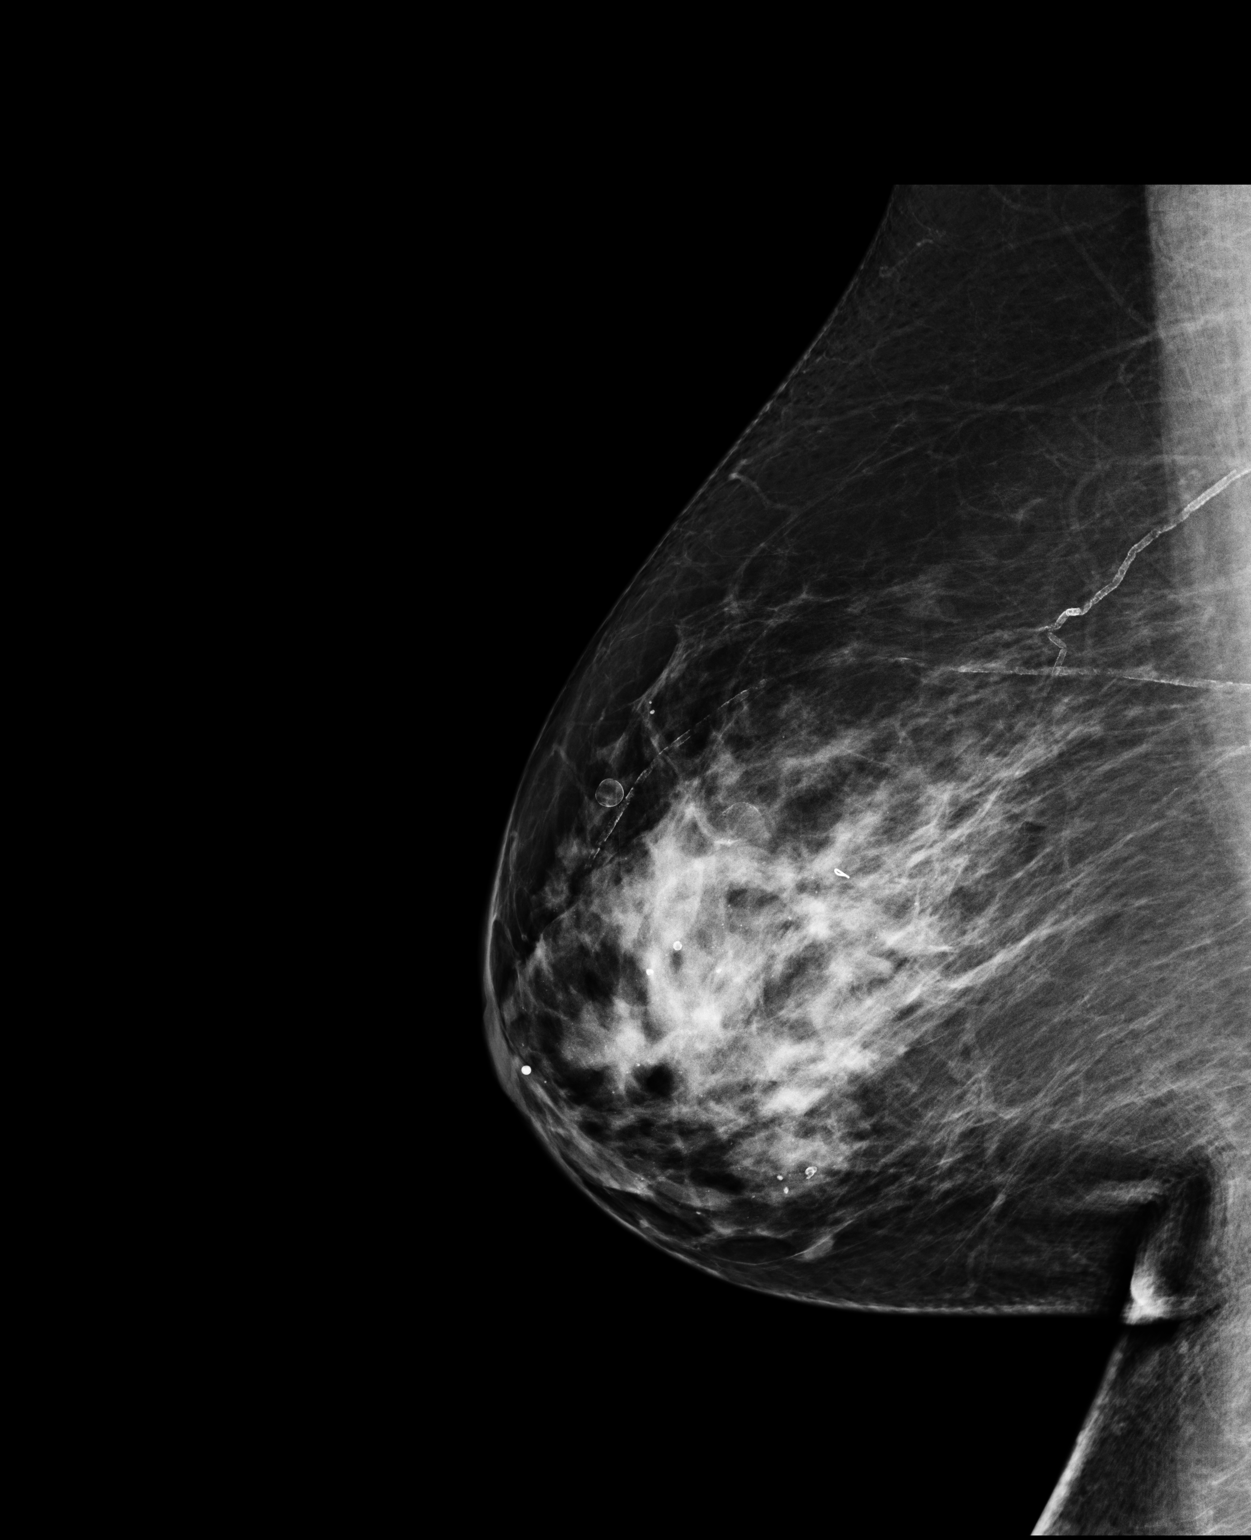

[1 of 1 positions shown; findings below may reference images not displayed]

FINDINGS: Films are performed following ultrasound guided biopsy of right
breast architectural distortion. Ribbon shaped biopsy marking clip
is in appropriate position 9 o'clock right breast middle depth.
IMPRESSION: Appropriate position of biopsy marking clip status post
ultrasound-guided core needle biopsy.

Final Assessment: Post Procedure Mammograms for Marker Placement

## 2015-06-11 ENCOUNTER — Encounter: Payer: Self-pay | Admitting: Pharmacist

## 2015-06-11 ENCOUNTER — Ambulatory Visit (INDEPENDENT_AMBULATORY_CARE_PROVIDER_SITE_OTHER): Payer: PPO | Admitting: Pharmacist

## 2015-06-11 ENCOUNTER — Ambulatory Visit (INDEPENDENT_AMBULATORY_CARE_PROVIDER_SITE_OTHER): Payer: PPO

## 2015-06-11 VITALS — BP 116/70 | HR 60 | Ht 69.5 in | Wt 277.0 lb

## 2015-06-11 DIAGNOSIS — Z0000 Encounter for general adult medical examination without abnormal findings: Secondary | ICD-10-CM

## 2015-06-11 DIAGNOSIS — R609 Edema, unspecified: Secondary | ICD-10-CM

## 2015-06-11 DIAGNOSIS — I4891 Unspecified atrial fibrillation: Secondary | ICD-10-CM | POA: Diagnosis not present

## 2015-06-11 DIAGNOSIS — Z780 Asymptomatic menopausal state: Secondary | ICD-10-CM

## 2015-06-11 LAB — POCT INR: INR: 3

## 2015-06-11 NOTE — Patient Instructions (Addendum)
Anticoagulation Dose Instructions as of 06/11/2015      Tammie [name] Tue Wed Thu Fri Sat   New Dose 2.5 mg 5 mg 2.5 mg 2.5 mg 2.5 mg 2.5 mg 2.5 mg    Description        Continue current warfarin dose to 1 tablet on mondays and 1/2 tablet all other days.     INR was 3.0 today    Tammie [name] , Thank you for taking time to come for your Medicare Wellness Visit. I appreciate your ongoing commitment to your health goals. Please review the following plan we discussed and let me know if I can assist you in the future.   These are the goals we discussed: Goals    . Increase physical activity     Increase activity as able.  Start with 5 to 10 minutes once or twice a day and work up to 30 minutes daily as able.   Can try walking inside or chair exercise.      . Reduce salt intake to 2 grams per day or less       This is a list of the screening recommended for you and due dates:  Health Maintenance  Topic Date Due  . DEXA scan (bone density measurement)  06/15/207  . Colon Cancer Screening  Last was 2008  . Shingles Vaccine  No required since history of shingles  . Flu Shot  07/28/2015  . Mammogram  02/01/2016  . Tetanus Vaccine  07/20/2022  . Pneumonia vaccines  Completed  *Topic was postponed. The date shown is not the original due date.    DASH Eating Plan DASH stands for "Dietary Approaches to Stop Hypertension." The DASH eating plan is a healthy eating plan that has been shown to reduce high blood pressure (hypertension). Additional health benefits may include reducing the risk of type 2 diabetes mellitus, heart disease, and stroke. The DASH eating plan may also help with weight loss. WHAT DO I NEED TO KNOW ABOUT THE DASH EATING PLAN? For the DASH eating plan, you will follow these general guidelines:  Choose foods with a percent daily value for sodium of less than 5% (as listed on the food label).  Use salt-free seasonings or herbs instead of table salt or sea salt.  Check with  your health care provider or pharmacist before using salt substitutes.  Eat lower-sodium products, often labeled as "lower sodium" or "no salt added."  Eat fresh foods.  Eat more vegetables, fruits, and low-fat dairy products.  Choose whole grains. Look for the word "whole" as the first word in the ingredient list.  Choose fish and skinless chicken or [place] more often than red meat. Limit fish, poultry, and meat to 6 oz (170 g) each day.  Limit sweets, desserts, sugars, and sugary drinks.  Choose heart-healthy fats.  Limit cheese to 1 oz (28 g) per day.  Eat more home-cooked food and less restaurant, buffet, and fast food.  Limit fried foods.  Cook foods using methods other than frying.  Limit canned vegetables. If you do use them, rinse them well to decrease the sodium.  When eating at a restaurant, ask that your food be prepared with less salt, or no salt if possible. WHAT FOODS CAN I EAT? Seek help from a dietitian for individual calorie needs. Grains Whole grain or whole wheat bread. Brown rice. Whole grain or whole wheat pasta. Quinoa, bulgur, and whole grain cereals. Low-sodium cereals. Corn or whole wheat flour tortillas. Whole  grain cornbread. Whole grain crackers. Low-sodium crackers. Vegetables Fresh or frozen vegetables (raw, steamed, roasted, or grilled). Low-sodium or reduced-sodium tomato and vegetable juices. Low-sodium or reduced-sodium tomato sauce and paste. Low-sodium or reduced-sodium canned vegetables.  Fruits All fresh, canned (in natural juice), or frozen fruits. Meat and Other Protein Products Ground beef (85% or leaner), grass-fed beef, or beef trimmed of fat. Skinless chicken or [place]. Ground chicken or [place]. Pork trimmed of fat. All fish and seafood. Eggs. Dried beans, peas, or lentils. Unsalted nuts and seeds. Unsalted canned beans. Dairy Low-fat dairy products, such as skim or 1% milk, 2% or reduced-fat cheeses, low-fat ricotta or cottage cheese,  or plain low-fat yogurt. Low-sodium or reduced-sodium cheeses. Fats and Oils Tub margarines without trans fats. Light or reduced-fat mayonnaise and salad dressings (reduced sodium). Avocado. Safflower, olive, or canola oils. Natural peanut or almond butter. Other Unsalted popcorn and pretzels. The items listed above may not be a complete list of recommended foods or beverages. Contact your dietitian for more options. WHAT FOODS ARE NOT RECOMMENDED? Grains White bread. White pasta. White rice. Refined cornbread. Bagels and croissants. Crackers that contain trans fat. Vegetables Creamed or fried vegetables. Vegetables in a cheese sauce. Regular canned vegetables. Regular canned tomato sauce and paste. Regular tomato and vegetable juices. Fruits Dried fruits. Canned fruit in light or heavy syrup. Fruit juice. Meat and Other Protein Products Fatty cuts of meat. Ribs, chicken wings, bacon, sausage, bologna, salami, chitterlings, fatback, hot dogs, bratwurst, and packaged luncheon meats. Salted nuts and seeds. Canned beans with salt. Dairy Whole or 2% milk, cream, half-and-half, and cream cheese. Whole-fat or sweetened yogurt. Full-fat cheeses or blue cheese. Nondairy creamers and whipped toppings. Processed cheese, cheese spreads, or cheese curds. Condiments Onion and garlic salt, seasoned salt, table salt, and sea salt. Canned and packaged gravies. Worcestershire sauce. Tartar sauce. Barbecue sauce. Teriyaki sauce. Soy sauce, including reduced sodium. Steak sauce. Fish sauce. Oyster sauce. Cocktail sauce. Horseradish. Ketchup and mustard. Meat flavorings and tenderizers. Bouillon cubes. Hot sauce. Tabasco sauce. Marinades. Taco seasonings. Relishes. Fats and Oils Butter, stick margarine, lard, shortening, ghee, and bacon fat. Coconut, palm kernel, or palm oils. Regular salad dressings. Other Pickles and olives. Salted popcorn and pretzels. The items listed above may not be a complete list of foods  and beverages to avoid. Contact your dietitian for more information. WHERE CAN I FIND MORE INFORMATION? National Heart, Lung, and Blood Institute: travelstabloid.com Document Released: 12/02/2011 Document Revised: 04/29/2014 Document Reviewed: 10/17/2013 [facility] Patient Information 2015 [place], [place]. This information is not intended to replace advice given to you by your health care provider. Make sure you discuss any questions you have with your health care provider.
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
## 2015-06-11 NOTE — Patient Instructions (Signed)
Anticoagulation Dose Instructions as of 06/11/2015      Tammie [name] Tue Wed Thu Fri Sat   New Dose 2.5 mg 5 mg 2.5 mg 2.5 mg 2.5 mg 2.5 mg 2.5 mg    Description        Continue current warfarin dose to 1 tablet on mondays and 1/2 tablet all other days.     INR was 3.0 today    Tammie [name] , Thank you for taking time to come for your Medicare Wellness Visit. I appreciate your ongoing commitment to your health goals. Please review the following plan we discussed and let me know if I can assist you in the future.   These are the goals we discussed: Goals    . Increase physical activity     Increase activity as able.  Start with 5 to 10 minutes once or twice a day and work up to 30 minutes daily as able.   Can try walking inside or chair exercise.      . Reduce salt intake to 2 grams per day or less       This is a list of the screening recommended for you and due dates:  Health Maintenance  Topic Date Due  . DEXA scan (bone density measurement)  06/15/207  . Colon Cancer Screening  Last was 2008  . Shingles Vaccine  No required since history of shingles  . Flu Shot  07/28/2015  . Mammogram  02/01/2016  . Tetanus Vaccine  07/20/2022  . Pneumonia vaccines  Completed  *Topic was postponed. The date shown is not the original due date.    DASH Eating Plan DASH stands for "Dietary Approaches to Stop Hypertension." The DASH eating plan is a healthy eating plan that has been shown to reduce high blood pressure (hypertension). Additional health benefits may include reducing the risk of type 2 diabetes mellitus, heart disease, and stroke. The DASH eating plan may also help with weight loss. WHAT DO I NEED TO KNOW ABOUT THE DASH EATING PLAN? For the DASH eating plan, you will follow these general guidelines:  Choose foods with a percent daily value for sodium of less than 5% (as listed on the food label).  Use salt-free seasonings or herbs instead of table salt or sea salt.  Check with  your health care provider or pharmacist before using salt substitutes.  Eat lower-sodium products, often labeled as "lower sodium" or "no salt added."  Eat fresh foods.  Eat more vegetables, fruits, and low-fat dairy products.  Choose whole grains. Look for the word "whole" as the first word in the ingredient list.  Choose fish and skinless chicken or [place] more often than red meat. Limit fish, poultry, and meat to 6 oz (170 g) each day.  Limit sweets, desserts, sugars, and sugary drinks.  Choose heart-healthy fats.  Limit cheese to 1 oz (28 g) per day.  Eat more home-cooked food and less restaurant, buffet, and fast food.  Limit fried foods.  Cook foods using methods other than frying.  Limit canned vegetables. If you do use them, rinse them well to decrease the sodium.  When eating at a restaurant, ask that your food be prepared with less salt, or no salt if possible. WHAT FOODS CAN I EAT? Seek help from a dietitian for individual calorie needs. Grains Whole grain or whole wheat bread. Brown rice. Whole grain or whole wheat pasta. Quinoa, bulgur, and whole grain cereals. Low-sodium cereals. Corn or whole wheat flour tortillas. Whole  grain cornbread. Whole grain crackers. Low-sodium crackers. Vegetables Fresh or frozen vegetables (raw, steamed, roasted, or grilled). Low-sodium or reduced-sodium tomato and vegetable juices. Low-sodium or reduced-sodium tomato sauce and paste. Low-sodium or reduced-sodium canned vegetables.  Fruits All fresh, canned (in natural juice), or frozen fruits. Meat and Other Protein Products Ground beef (85% or leaner), grass-fed beef, or beef trimmed of fat. Skinless chicken or [place]. Ground chicken or [place]. Pork trimmed of fat. All fish and seafood. Eggs. Dried beans, peas, or lentils. Unsalted nuts and seeds. Unsalted canned beans. Dairy Low-fat dairy products, such as skim or 1% milk, 2% or reduced-fat cheeses, low-fat ricotta or cottage cheese,  or plain low-fat yogurt. Low-sodium or reduced-sodium cheeses. Fats and Oils Tub margarines without trans fats. Light or reduced-fat mayonnaise and salad dressings (reduced sodium). Avocado. Safflower, olive, or canola oils. Natural peanut or almond butter. Other Unsalted popcorn and pretzels. The items listed above may not be a complete list of recommended foods or beverages. Contact your dietitian for more options. WHAT FOODS ARE NOT RECOMMENDED? Grains White bread. White pasta. White rice. Refined cornbread. Bagels and croissants. Crackers that contain trans fat. Vegetables Creamed or fried vegetables. Vegetables in a cheese sauce. Regular canned vegetables. Regular canned tomato sauce and paste. Regular tomato and vegetable juices. Fruits Dried fruits. Canned fruit in light or heavy syrup. Fruit juice. Meat and Other Protein Products Fatty cuts of meat. Ribs, chicken wings, bacon, sausage, bologna, salami, chitterlings, fatback, hot dogs, bratwurst, and packaged luncheon meats. Salted nuts and seeds. Canned beans with salt. Dairy Whole or 2% milk, cream, half-and-half, and cream cheese. Whole-fat or sweetened yogurt. Full-fat cheeses or blue cheese. Nondairy creamers and whipped toppings. Processed cheese, cheese spreads, or cheese curds. Condiments Onion and garlic salt, seasoned salt, table salt, and sea salt. Canned and packaged gravies. Worcestershire sauce. Tartar sauce. Barbecue sauce. Teriyaki sauce. Soy sauce, including reduced sodium. Steak sauce. Fish sauce. Oyster sauce. Cocktail sauce. Horseradish. Ketchup and mustard. Meat flavorings and tenderizers. Bouillon cubes. Hot sauce. Tabasco sauce. Marinades. Taco seasonings. Relishes. Fats and Oils Butter, stick margarine, lard, shortening, ghee, and bacon fat. Coconut, palm kernel, or palm oils. Regular salad dressings. Other Pickles and olives. Salted popcorn and pretzels. The items listed above may not be a complete list of foods  and beverages to avoid. Contact your dietitian for more information. WHERE CAN I FIND MORE INFORMATION? National Heart, Lung, and Blood Institute: travelstabloid.com Document Released: 12/02/2011 Document Revised: 04/29/2014 Document Reviewed: 10/17/2013 [facility] Patient Information 2015 [place], [place]. This information is not intended to replace advice given to you by your health care provider. Make sure you discuss any questions you have with your health care provider.

## 2015-06-24 ENCOUNTER — Other Ambulatory Visit: Payer: Self-pay | Admitting: Urology

## 2015-06-24 ENCOUNTER — Telehealth: Payer: Self-pay | Admitting: Pharmacist Clinician (PhC)/ Clinical Pharmacy Specialist

## 2015-06-24 NOTE — Telephone Encounter (Signed)
Patient called that she has kidney stones that require surgery on 7/13 and the surgeon instructed her to call us for a lovenox bridge and directions on holding warfarin.  I will have [name] Eckard address this tomorrow since she last bridged this patient per Ms. Fabio Neighbors.

## 2015-06-26 ENCOUNTER — Telehealth: Payer: Self-pay | Admitting: Pharmacist

## 2015-06-26 NOTE — Telephone Encounter (Signed)
Memory Argue received call from patient that she needed to stop warfarin prior to up coming procedure / surgery to kidney stone and would need bridging with Lovenox.  I called patient to clarify dates and she states that she spoke with Dr Cy Blamer office yesterday and they mentioned that she might not need to hold warfarin for 5 days.  I called and left message with Velna Hatchet, Dr Cy Blamer nurse to clarify what his preference is - hold warfarin 2 or 5 days.

## 2015-06-27 NOTE — Telephone Encounter (Signed)
Received call from [facility] with [facility].  Dr Risa Grill said OK for patient to hold warfarin just 2 days.  No bridging recommended.  Patient to hold warfarin 7/11 and 7/12 before procedure on 07/09/15.   Then will take '5mg'$  1 tablet for 2 days when ok's to restart warfarin, then back to usual dose.  Patient notified.

## 2015-07-03 ENCOUNTER — Encounter (HOSPITAL_COMMUNITY): Payer: Self-pay

## 2015-07-05 IMAGING — CR DG CHEST 2V
2 series · 2 of 2 positions shown · non-contrast
Comparison: Chest CT 10/22/2013 and chest radiographs 03/11/2012

CLINICAL DATA: Preop for right breast surgery. Hypertension. Atrial
fibrillation.

EXAM:
CHEST  2 VIEW

[w chest pa]
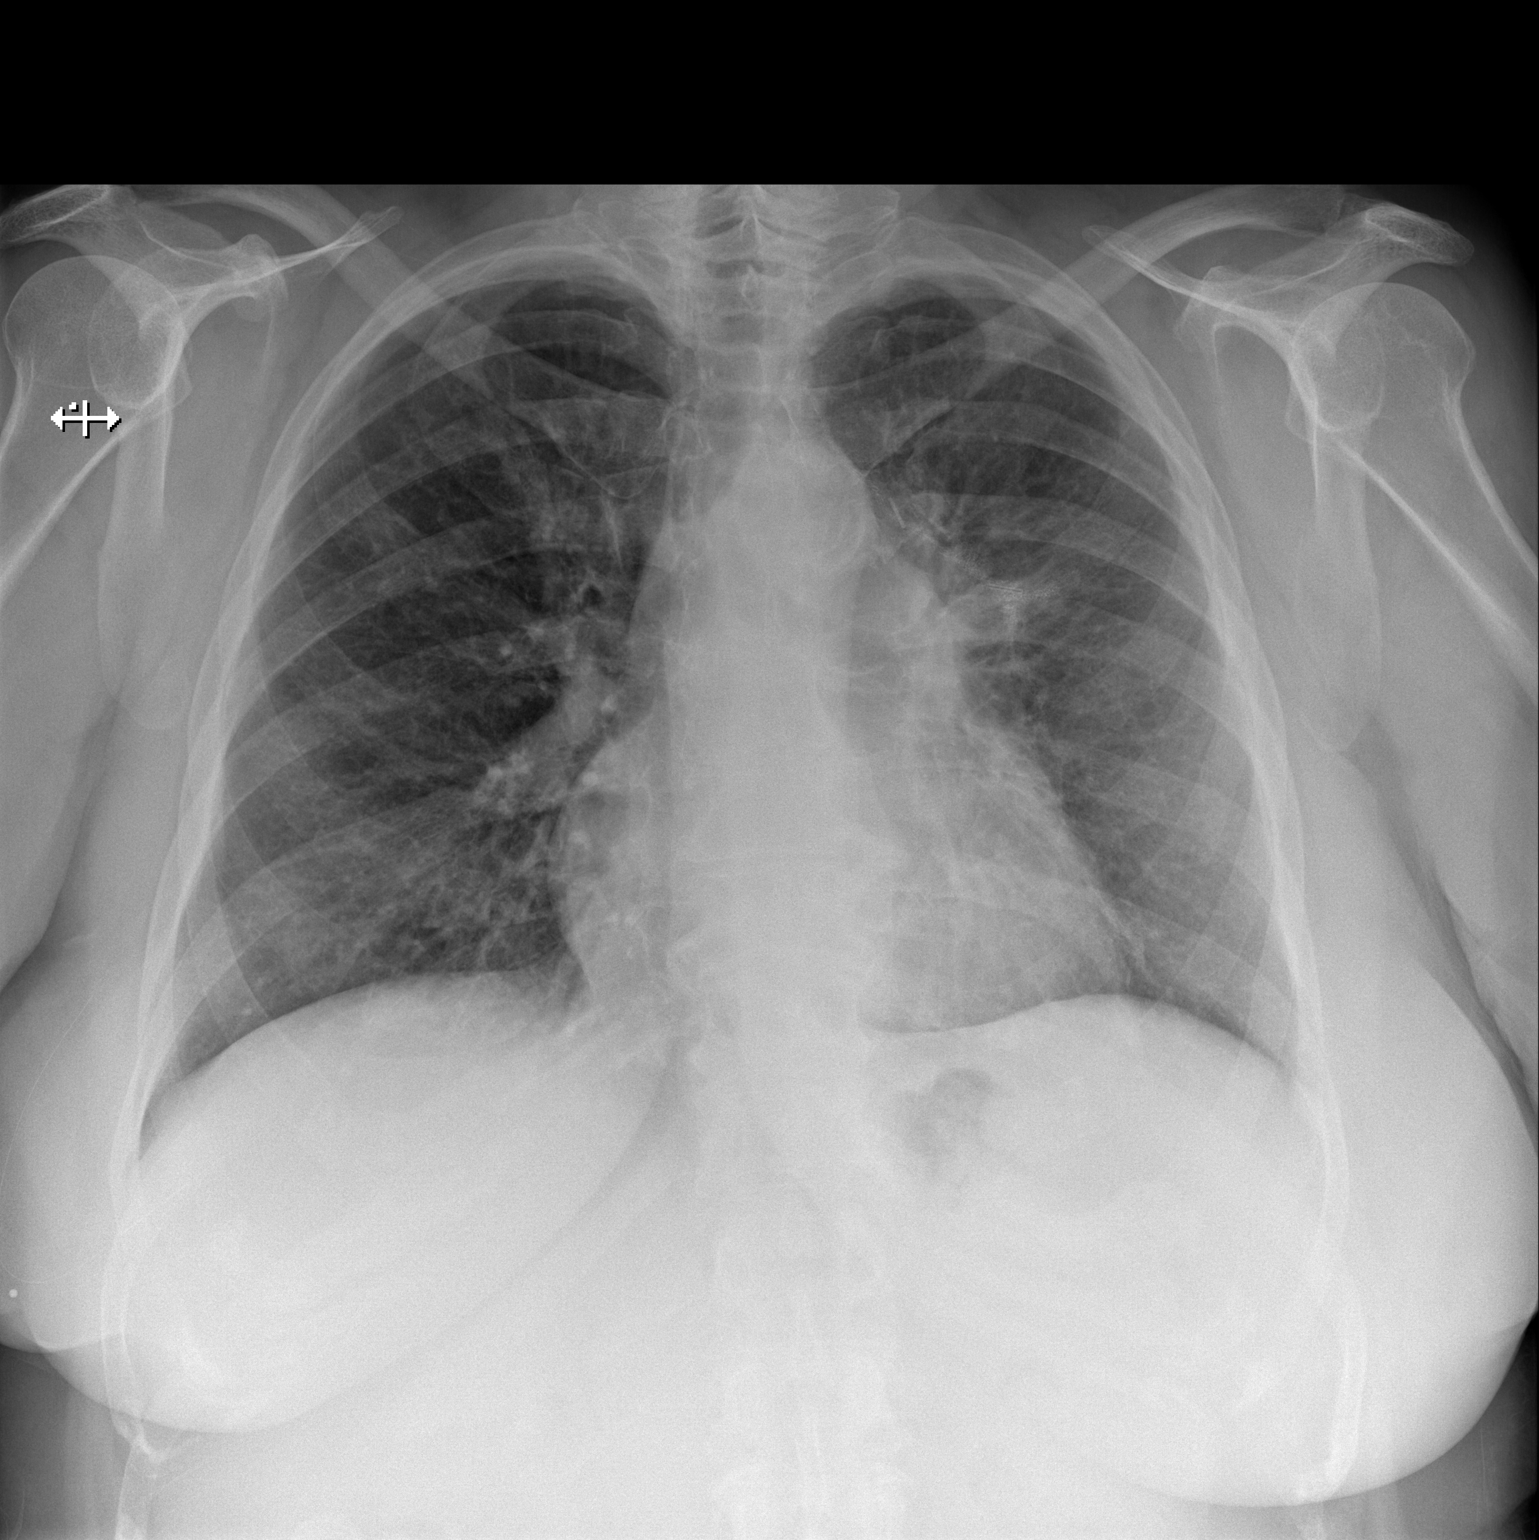

[w chest lat]
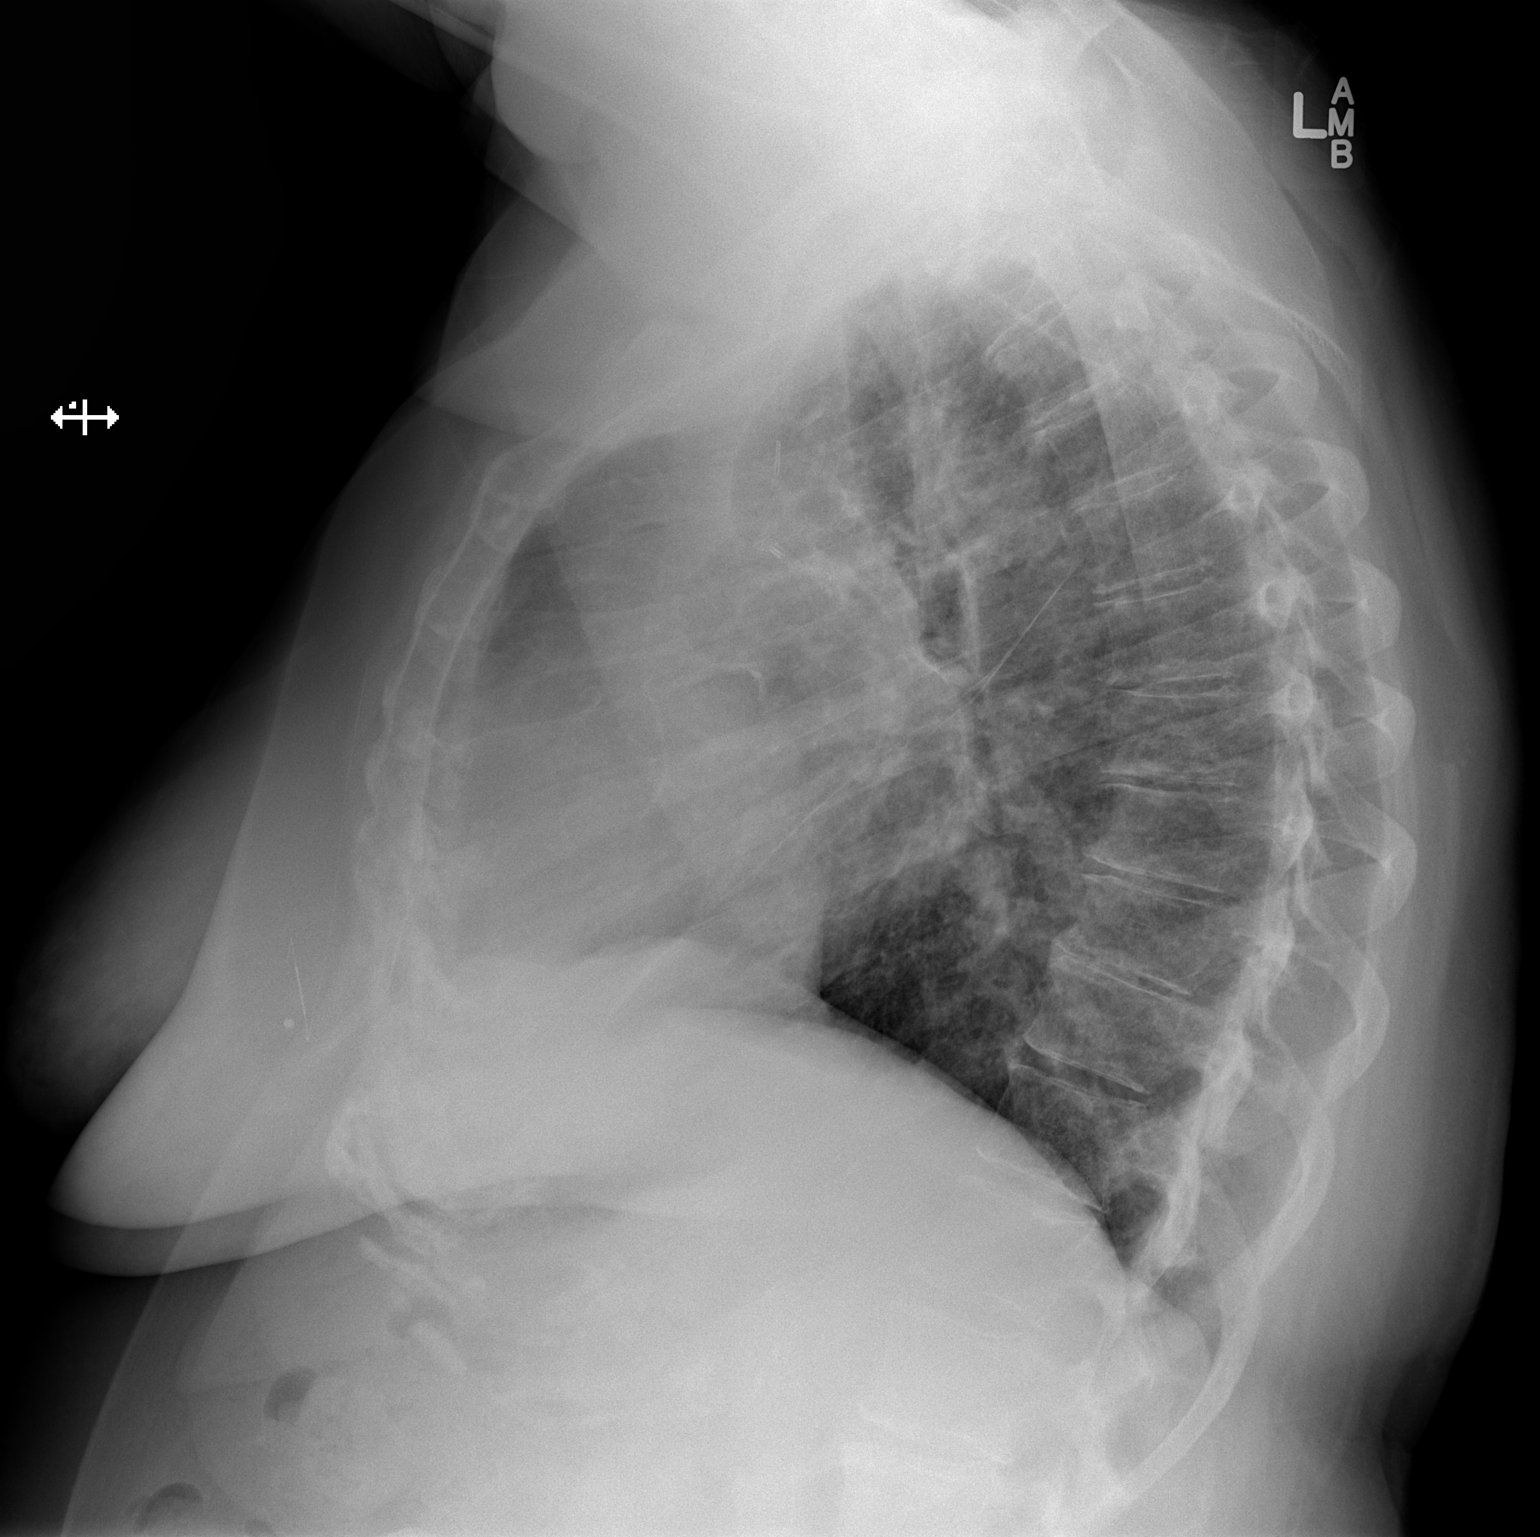

[2 of 2 positions shown; findings below may reference images not displayed]

FINDINGS: The cardiomediastinal silhouette is within normal limits. Left
suprahilar opacity is less prominent than on 03/11/2012 chest
radiographs. The lungs are slightly hyperinflated without evidence
of new airspace consolidation, edema, or pleural effusion. Metallic
marker is noted in the right breast. No acute osseous abnormality is
identified.
IMPRESSION: No evidence of active cardiopulmonary disease.

## 2015-07-07 ENCOUNTER — Encounter (HOSPITAL_COMMUNITY)
Admission: RE | Admit: 2015-07-07 | Discharge: 2015-07-07 | Disposition: A | Discharge status: Home / Self Care | Payer: PPO | Source: Ambulatory Visit | Attending: Urology | Admitting: Urology

## 2015-07-07 ENCOUNTER — Encounter (HOSPITAL_COMMUNITY): Payer: Self-pay

## 2015-07-07 DIAGNOSIS — M79605 Pain in left leg: Secondary | ICD-10-CM | POA: Diagnosis not present

## 2015-07-07 HISTORY — DX: Other injury of unspecified body region, initial encounter: T14.8XXA

## 2015-07-07 HISTORY — DX: Paresthesia of skin: R20.2

## 2015-07-07 HISTORY — DX: Anesthesia of skin: R20.0

## 2015-07-07 HISTORY — DX: Personal history of urinary (tract) infections: Z87.440

## 2015-07-07 HISTORY — DX: Dependence on supplemental oxygen: Z99.81

## 2015-07-07 HISTORY — DX: Disorder of the skin and subcutaneous tissue, unspecified: L98.9

## 2015-07-07 HISTORY — DX: Tinnitus, unspecified ear: H93.19

## 2015-07-07 LAB — BASIC METABOLIC PANEL
ANION GAP: 9 (ref 5–15)
BUN: 34 mg/dL — ABNORMAL HIGH (ref 6–20)
CHLORIDE: 105 mmol/L (ref 101–111)
CO2: 28 mmol/L (ref 22–32)
Calcium: 9.2 mg/dL (ref 8.9–10.3)
Creatinine, Ser: 2.06 mg/dL — ABNORMAL HIGH (ref 0.44–1.00)
GFR calc non Af Amer: 23 mL/min — ABNORMAL LOW (ref >60)
GFR, EST AFRICAN AMERICAN: 26 mL/min — AB (ref >60)
Glucose, Bld: 107 mg/dL — ABNORMAL HIGH (ref 65–99)
POTASSIUM: 4.8 mmol/L (ref 3.5–5.1)
Sodium: 142 mmol/L (ref 135–145)

## 2015-07-07 LAB — CBC
HCT: 29.8 % — ABNORMAL LOW (ref 36.0–46.0)
Hemoglobin: 9.2 g/dL — ABNORMAL LOW (ref 12.0–15.0)
MCH: 26.7 pg (ref 26.0–34.0)
MCHC: 30.9 g/dL (ref 30.0–36.0)
MCV: 86.4 fL (ref 78.0–100.0)
PLATELETS: 203 10*3/uL (ref 150–400)
RBC: 3.45 MIL/uL — ABNORMAL LOW (ref 3.87–5.11)
RDW: 14.5 % (ref 11.5–15.5)
WBC: 8.1 10*3/uL (ref 4.0–10.5)

## 2015-07-07 LAB — PROTIME-INR
INR: 2.29 — AB (ref 0.00–1.49)
Prothrombin Time: 25 seconds — ABNORMAL HIGH (ref 11.6–15.2)

## 2015-07-07 LAB — APTT: aPTT: 49 seconds — ABNORMAL HIGH (ref 24–37)

## 2015-07-07 NOTE — Progress Notes (Signed)
Pt, ptt, cbc, bmet routed to dr grapey by epic

## 2015-07-07 NOTE — Progress Notes (Signed)
ekg 07-11-14 epic Chest xray 2 view 5-12-1 6 epic

## 2015-07-07 NOTE — Patient Instructions (Signed)
Tammie [name]  07/07/2015   Your procedure is scheduled on: Wednesday July 13th, 2016  Report to [facility]  Entrance take [place]  elevators to 3rd floor to  [place] at 800 AM.  Call this number if you have problems the morning of surgery [phone]   Remember: ONLY 1 PERSON MAY GO WITH YOU TO SHORT STAY TO GET  READY MORNING OF [place].  Do not eat food or drink liquids :After Midnight.     Take these medicines the morning of surgery with A SIP OF WATER: Metoprolol, Albuterolol inhaler if needed and  Bring inhaler with you,                                 You may not have any metal on your body including hair pins and              piercings  Do not wear jewelry, make-up, lotions, powders or perfumes, deodorant             Do not wear nail polish.  Do not shave  48 hours prior to surgery.              Men may shave face and neck.   Do not bring valuables to the hospital. [place].  Contacts, dentures or bridgework may not be worn into surgery.  Leave suitcase in the car. After surgery it may be brought to your room.     Patients discharged the day of surgery will not be allowed to drive home.  Name and phone number of your driver: Rafael Bihari friend friend cell [phone]  Special Instructions: N/A              Please read over the following fact sheets you were given: _____________________________________________________________________             [facility] - Preparing for Surgery Before surgery, you can play an important role.  Because skin is not sterile, your skin needs to be as free of germs as possible.  You can reduce the number of germs on your skin by washing with CHG (chlorahexidine gluconate) soap before surgery.  CHG is an antiseptic cleaner which kills germs and bonds with the skin to continue killing germs even after washing. Please DO NOT use if you have an  allergy to CHG or antibacterial soaps.  If your skin becomes reddened/irritated stop using the CHG and inform your nurse when you arrive at Short Stay. Do not shave (including legs and underarms) for at least 48 hours prior to the first CHG shower.  You may shave your face/neck. Please follow these instructions carefully:  1.  Shower with CHG Soap the night before surgery and the  morning of Surgery.  2.  If you choose to wash your hair, wash your hair first as usual with your  normal  shampoo.  3.  After you shampoo, rinse your hair and body thoroughly to remove the  shampoo.                           4.  Use CHG as you would any other liquid soap.  You can apply chg  directly  to the skin and wash                       Gently with a scrungie or clean washcloth.  5.  Apply the CHG Soap to your body ONLY FROM THE NECK DOWN.   Do not use on face/ open                           Wound or open sores. Avoid contact with eyes, ears mouth and genitals (private parts).                       Wash face,  Genitals (private parts) with your normal soap.             6.  Wash thoroughly, paying special attention to the area where your surgery  will be performed.  7.  Thoroughly rinse your body with warm water from the neck down.  8.  DO NOT shower/wash with your normal soap after using and rinsing off  the CHG Soap.                9.  Pat yourself dry with a clean towel.            10.  Wear clean pajamas.            11.  Place clean sheets on your bed the night of your first shower and do not  sleep with pets. Day of Surgery : Do not apply any lotions/deodorants the morning of surgery.  Please wear clean clothes to the hospital/surgery center.  FAILURE TO FOLLOW THESE INSTRUCTIONS MAY RESULT IN THE CANCELLATION OF YOUR SURGERY PATIENT SIGNATURE_________________________________  NURSE SIGNATURE__________________________________  ________________________________________________________________________

## 2015-07-07 NOTE — Progress Notes (Signed)
   07/07/15 1315  OBSTRUCTIVE SLEEP APNEA  Have you ever been diagnosed with sleep apnea through a sleep study? No  Do you snore loudly (loud enough to be heard through closed doors)?  0  Do you often feel tired, fatigued, or sleepy during the daytime? 1  Has anyone observed you stop breathing during your sleep? 0  Do you have, or are you being treated for high blood pressure? 1  BMI more than 35 kg/m2? 1  Age over 74 years old? 1  Neck circumference greater than 40 cm/16 inches? 0  Gender: 0  Obstructive Sleep Apnea Score 4

## 2015-07-08 ENCOUNTER — Ambulatory Visit (HOSPITAL_COMMUNITY)
Admission: RE | Admit: 2015-07-08 | Discharge: 2015-07-08 | Disposition: A | Discharge status: Home / Self Care | Payer: PPO | Source: Ambulatory Visit | Attending: Physician Assistant | Admitting: Physician Assistant

## 2015-07-08 ENCOUNTER — Other Ambulatory Visit: Payer: Self-pay | Admitting: Physician Assistant

## 2015-07-08 ENCOUNTER — Encounter: Payer: Self-pay | Admitting: Physician Assistant

## 2015-07-08 ENCOUNTER — Ambulatory Visit (INDEPENDENT_AMBULATORY_CARE_PROVIDER_SITE_OTHER): Payer: PPO | Admitting: Physician Assistant

## 2015-07-08 VITALS — BP 151/79 | HR 79 | Temp 97.8°F | Ht 71.0 in | Wt 274.0 lb

## 2015-07-08 DIAGNOSIS — L03116 Cellulitis of left lower limb: Secondary | ICD-10-CM | POA: Diagnosis not present

## 2015-07-08 DIAGNOSIS — M79605 Pain in left leg: Secondary | ICD-10-CM | POA: Insufficient documentation

## 2015-07-08 MED ORDER — DEXTROSE 5 % IV SOLN
3.0000 g | INTRAVENOUS | Status: AC
  Administered 2015-07-09: 3 g via INTRAVENOUS
  Filled 2015-07-08: qty 3000

## 2015-07-08 MED ORDER — CEPHALEXIN 500 MG PO CAPS: 500.0000 mg | ORAL_CAPSULE | Freq: Four times a day (QID) | ORAL | Status: DC

## 2015-07-08 NOTE — Progress Notes (Signed)
   Subjective:    Patient ID: Tammie [name], female    DOB: 1941/03/23, 74 y.o.   MRN: [id number]  HPI 74 y/o female with afib presents with left leg pain and redness s/p a fall on July 4th. She states that she was using Mecurichrome on it with relief, however, 1 week ago pain increased and increased redness and inflammation. History of     Review of Systems  Constitutional: Positive for fever (low grade 99.75F) and chills. Negative for diaphoresis.  HENT: Negative.   Eyes: Negative.   Gastrointestinal: Positive for nausea. Negative for vomiting.  Skin: Positive for wound (left lower extremity).       Objective:   Physical Exam  Constitutional: She is oriented to person, place, and time. She appears well-developed and well-nourished.  Pulmonary/Chest: Effort normal and breath sounds normal.  Abdominal:  obese  Musculoskeletal: She exhibits edema (LLE ) and tenderness.  Neurological: She is alert and oriented to person, place, and time.  Skin: Skin is warm. There is erythema (LLE ).  Psychiatric: She has a normal mood and affect. Her behavior is normal. Judgment and thought content normal.  Nursing note and vitals reviewed.         Assessment & Plan:  1. Cellulitis of leg, left  - Aerobic culture - cephALEXin (KEFLEX) 500 MG capsule; Take 1 capsule (500 mg total) by mouth 4 (four) times daily.  Dispense: 28 capsule; Refill: 0  2. Left lower ext edema - D/t patient's h/o DVT will order U/S doppler to r/o thrombosis   Continue all meds Labs pending Health Maintenance reviewed Diet and exercise encouraged RTO 2 weeks   Joylyn Duggin A. Benjamin Stain PA-C

## 2015-07-09 ENCOUNTER — Encounter (HOSPITAL_COMMUNITY)
Admission: RE | Disposition: A | Discharge status: Home / Self Care | Payer: Self-pay | Source: Ambulatory Visit | Attending: Urology

## 2015-07-09 ENCOUNTER — Ambulatory Visit (HOSPITAL_COMMUNITY): Payer: PPO | Admitting: Certified Registered Nurse Anesthetist

## 2015-07-09 ENCOUNTER — Ambulatory Visit (HOSPITAL_COMMUNITY)
Admission: RE | Admit: 2015-07-09 | Discharge: 2015-07-09 | Disposition: A | Discharge status: Home / Self Care | Payer: PPO | Source: Ambulatory Visit | Attending: Urology | Admitting: Urology

## 2015-07-09 ENCOUNTER — Encounter (HOSPITAL_COMMUNITY): Payer: Self-pay | Admitting: *Deleted

## 2015-07-09 ENCOUNTER — Ambulatory Visit (HOSPITAL_COMMUNITY): Payer: PPO

## 2015-07-09 DIAGNOSIS — Z6838 Body mass index (BMI) 38.0-38.9, adult: Secondary | ICD-10-CM | POA: Diagnosis not present

## 2015-07-09 DIAGNOSIS — N200 Calculus of kidney: Secondary | ICD-10-CM | POA: Diagnosis present

## 2015-07-09 DIAGNOSIS — N2889 Other specified disorders of kidney and ureter: Secondary | ICD-10-CM | POA: Diagnosis not present

## 2015-07-09 DIAGNOSIS — N3281 Overactive bladder: Secondary | ICD-10-CM | POA: Diagnosis not present

## 2015-07-09 DIAGNOSIS — Z7901 Long term (current) use of anticoagulants: Secondary | ICD-10-CM | POA: Diagnosis not present

## 2015-07-09 DIAGNOSIS — Z792 Long term (current) use of antibiotics: Secondary | ICD-10-CM | POA: Insufficient documentation

## 2015-07-09 DIAGNOSIS — Z9221 Personal history of antineoplastic chemotherapy: Secondary | ICD-10-CM | POA: Insufficient documentation

## 2015-07-09 DIAGNOSIS — J449 Chronic obstructive pulmonary disease, unspecified: Secondary | ICD-10-CM | POA: Diagnosis not present

## 2015-07-09 DIAGNOSIS — E780 Pure hypercholesterolemia: Secondary | ICD-10-CM | POA: Insufficient documentation

## 2015-07-09 DIAGNOSIS — K219 Gastro-esophageal reflux disease without esophagitis: Secondary | ICD-10-CM | POA: Diagnosis not present

## 2015-07-09 DIAGNOSIS — Z85828 Personal history of other malignant neoplasm of skin: Secondary | ICD-10-CM | POA: Insufficient documentation

## 2015-07-09 DIAGNOSIS — Z79899 Other long term (current) drug therapy: Secondary | ICD-10-CM | POA: Insufficient documentation

## 2015-07-09 DIAGNOSIS — I10 Essential (primary) hypertension: Secondary | ICD-10-CM | POA: Insufficient documentation

## 2015-07-09 DIAGNOSIS — Z8673 Personal history of transient ischemic attack (TIA), and cerebral infarction without residual deficits: Secondary | ICD-10-CM | POA: Diagnosis not present

## 2015-07-09 DIAGNOSIS — D649 Anemia, unspecified: Secondary | ICD-10-CM | POA: Diagnosis not present

## 2015-07-09 DIAGNOSIS — Z87891 Personal history of nicotine dependence: Secondary | ICD-10-CM | POA: Diagnosis not present

## 2015-07-09 DIAGNOSIS — Z7952 Long term (current) use of systemic steroids: Secondary | ICD-10-CM | POA: Diagnosis not present

## 2015-07-09 DIAGNOSIS — Z85118 Personal history of other malignant neoplasm of bronchus and lung: Secondary | ICD-10-CM | POA: Insufficient documentation

## 2015-07-09 DIAGNOSIS — E669 Obesity, unspecified: Secondary | ICD-10-CM | POA: Diagnosis not present

## 2015-07-09 DIAGNOSIS — I739 Peripheral vascular disease, unspecified: Secondary | ICD-10-CM | POA: Diagnosis not present

## 2015-07-09 DIAGNOSIS — M1990 Unspecified osteoarthritis, unspecified site: Secondary | ICD-10-CM | POA: Insufficient documentation

## 2015-07-09 DIAGNOSIS — I4891 Unspecified atrial fibrillation: Secondary | ICD-10-CM | POA: Insufficient documentation

## 2015-07-09 DIAGNOSIS — J45909 Unspecified asthma, uncomplicated: Secondary | ICD-10-CM | POA: Insufficient documentation

## 2015-07-09 HISTORY — PX: CYSTOSCOPY WITH RETROGRADE PYELOGRAM, URETEROSCOPY AND STENT PLACEMENT: SHX5789

## 2015-07-09 HISTORY — PX: CYSTOSCOPY WITH HOLMIUM LASER LITHOTRIPSY: SHX6639

## 2015-07-09 LAB — PROTIME-INR
INR: 1.87 — ABNORMAL HIGH (ref 0.00–1.49)
Prothrombin Time: 21.4 seconds — ABNORMAL HIGH (ref 11.6–15.2)

## 2015-07-09 SURGERY — CYSTOURETEROSCOPY, WITH RETROGRADE PYELOGRAM AND STENT INSERTION
Anesthesia: General | Laterality: Right

## 2015-07-09 MED ORDER — PHENYLEPHRINE HCL 10 MG/ML IJ SOLN
INTRAMUSCULAR | Status: DC | PRN
  Administered 2015-07-09: 80 ug via INTRAVENOUS

## 2015-07-09 MED ORDER — HYDROCODONE-ACETAMINOPHEN 5-325 MG PO TABS
1.0000 | ORAL_TABLET | Freq: Four times a day (QID) | ORAL | Status: AC | PRN
  Administered 2015-07-09: 1 via ORAL
  Filled 2015-07-09: qty 1

## 2015-07-09 MED ORDER — LIDOCAINE HCL (CARDIAC) 20 MG/ML IV SOLN
INTRAVENOUS | Status: AC
  Filled 2015-07-09: qty 5

## 2015-07-09 MED ORDER — ONDANSETRON HCL 4 MG/2ML IJ SOLN
INTRAMUSCULAR | Status: AC
  Filled 2015-07-09: qty 2

## 2015-07-09 MED ORDER — PROPOFOL 10 MG/ML IV BOLUS
INTRAVENOUS | Status: AC
  Filled 2015-07-09: qty 20

## 2015-07-09 MED ORDER — FENTANYL CITRATE (PF) 100 MCG/2ML IJ SOLN
INTRAMUSCULAR | Status: AC
  Filled 2015-07-09: qty 2

## 2015-07-09 MED ORDER — PROMETHAZINE HCL 25 MG/ML IJ SOLN: 6.2500 mg | INTRAMUSCULAR | Status: DC | PRN

## 2015-07-09 MED ORDER — FENTANYL CITRATE (PF) 100 MCG/2ML IJ SOLN
INTRAMUSCULAR | Status: DC | PRN
  Administered 2015-07-09: 50 ug via INTRAVENOUS
  Administered 2015-07-09 (×4): 25 ug via INTRAVENOUS
  Administered 2015-07-09: 50 ug via INTRAVENOUS

## 2015-07-09 MED ORDER — SODIUM CHLORIDE 0.9 % IR SOLN
Status: DC | PRN
  Administered 2015-07-09: 5000 mL

## 2015-07-09 MED ORDER — LIDOCAINE HCL 2 % EX GEL
CUTANEOUS | Status: DC | PRN
  Administered 2015-07-09: 1 via URETHRAL

## 2015-07-09 MED ORDER — ONDANSETRON HCL 4 MG/2ML IJ SOLN
INTRAMUSCULAR | Status: DC | PRN
  Administered 2015-07-09: 4 mg via INTRAVENOUS

## 2015-07-09 MED ORDER — PHENYLEPHRINE 40 MCG/ML (10ML) SYRINGE FOR IV PUSH (FOR BLOOD PRESSURE SUPPORT)
PREFILLED_SYRINGE | INTRAVENOUS | Status: AC
  Filled 2015-07-09: qty 10

## 2015-07-09 MED ORDER — LIDOCAINE HCL 2 % EX GEL
CUTANEOUS | Status: AC
  Filled 2015-07-09: qty 10

## 2015-07-09 MED ORDER — PROPOFOL 10 MG/ML IV BOLUS
INTRAVENOUS | Status: DC | PRN
Start: 2015-07-09 — End: 2015-07-09
  Administered 2015-07-09: 200 mg via INTRAVENOUS

## 2015-07-09 MED ORDER — URIBEL 118 MG PO CAPS: 1.0000 | ORAL_CAPSULE | Freq: Three times a day (TID) | ORAL | Status: DC | PRN

## 2015-07-09 MED ORDER — FENTANYL CITRATE (PF) 100 MCG/2ML IJ SOLN: 25.0000 ug | INTRAMUSCULAR | Status: DC | PRN

## 2015-07-09 MED ORDER — HYDROCODONE-ACETAMINOPHEN 5-325 MG PO TABS: 1.0000 | ORAL_TABLET | Freq: Four times a day (QID) | ORAL | Status: DC | PRN

## 2015-07-09 MED ORDER — LACTATED RINGERS IV SOLN
INTRAVENOUS | Status: DC
  Administered 2015-07-09: 1000 mL via INTRAVENOUS

## 2015-07-09 MED ORDER — LIDOCAINE HCL (CARDIAC) 20 MG/ML IV SOLN
INTRAVENOUS | Status: DC | PRN
  Administered 2015-07-09: 100 mg via INTRAVENOUS

## 2015-07-09 SURGICAL SUPPLY — 18 items
BAG URINE DRAINAGE (UROLOGICAL SUPPLIES) IMPLANT
BASKET ZERO TIP NITINOL 2.4FR (BASKET) ×3 IMPLANT
CATH INTERMIT  6FR 70CM (CATHETERS) ×3 IMPLANT
CLOTH BEACON ORANGE TIMEOUT ST (SAFETY) ×3 IMPLANT
FIBER LASER FLEXIVA 1000 (UROLOGICAL SUPPLIES) IMPLANT
FIBER LASER FLEXIVA 200 (UROLOGICAL SUPPLIES) ×3 IMPLANT
FIBER LASER FLEXIVA 365 (UROLOGICAL SUPPLIES) IMPLANT
FIBER LASER FLEXIVA 550 (UROLOGICAL SUPPLIES) IMPLANT
FIBER LASER TRAC TIP (UROLOGICAL SUPPLIES) ×3 IMPLANT
GLOVE BIOGEL M STRL SZ7.5 (GLOVE) ×9 IMPLANT
GOWN STRL REUS W/TWL XL LVL3 (GOWN DISPOSABLE) ×9 IMPLANT
GUIDEWIRE .038 (WIRE) IMPLANT
GUIDEWIRE ANG ZIPWIRE 038X150 (WIRE) IMPLANT
GUIDEWIRE STR DUAL SENSOR (WIRE) ×3 IMPLANT
IV NS IRRIG 3000ML ARTHROMATIC (IV SOLUTION) IMPLANT
PACK CYSTO (CUSTOM PROCEDURE TRAY) ×3 IMPLANT
SHEATH ACCESS URETERAL 24CM (SHEATH) ×3 IMPLANT
STENT URET 6FRX24 CONTOUR (STENTS) ×3 IMPLANT

## 2015-07-09 NOTE — Discharge Instructions (Addendum)
[facility] [phone] Post Ureteroscopy With or Without Stent Instructions  Definitions:  Ureter: The duct that transports urine from the kidney to the bladder. Stent:   A plastic hollow tube that is placed into the ureter, from the kidney to the                 bladder to prevent the ureter from swelling shut.  GENERAL INSTRUCTIONS:  Despite the fact that no skin incisions were used, the area around the ureter and bladder is raw and irritated. The stent is a foreign body which will further irritate the bladder wall. This irritation is manifested by increased frequency of urination, both day and night, and by an increase in the urge to urinate. In some, the urge to urinate is present almost always. Sometimes the urge is strong enough that you may not be able to stop yourself from urinating. The only real cure is to remove the stent and then give time for the bladder wall to heal which can't be done until the danger of the ureter swelling shut has passed, which varies.  You may see some blood in your urine while the stent is in place and a few days afterwards. Do not be alarmed, even if the urine was clear for a while. Get off your feet and drink lots of fluids until clearing occurs. If you start to pass clots or don't improve, call us.  DIET: You may return to your normal diet immediately. Because of the raw surface of your bladder, alcohol, spicy foods, acid type foods and drinks with caffeine may cause irritation or frequency and should be used in moderation. To keep your urine flowing freely and to avoid constipation, drink plenty of fluids during the day ( 8-10 glasses ). Tip: Avoid cranberry juice because it is very acidic.  ACTIVITY: Your physical activity doesn't need to be restricted. However, if you are very active, you may see some blood in your urine. We suggest that you reduce your activity under these circumstances until the bleeding has stopped.  BOWELS: It is  important to keep your bowels regular during the postoperative period. Straining with bowel movements can cause bleeding. A bowel movement every other day is reasonable. Use a mild laxative if needed, such as Milk of Magnesia 2-3 tablespoons, or 2 Dulcolax tablets. Call if you continue to have problems. If you have been taking narcotics for pain, before, during or after your surgery, you may be constipated. Take a laxative if necessary.   MEDICATION: You should resume your pre-surgery medications unless told not to. In addition you will often be given an antibiotic to prevent infection. These should be taken as prescribed until the bottles are finished unless you are having an unusual reaction to one of the drugs.  PROBLEMS YOU SHOULD REPORT TO [place]:  Fevers over 100.5 Fahrenheit.  Heavy bleeding, or clots ( See above notes about blood in urine ).  Inability to urinate.  Drug reactions ( hives, rash, nausea, vomiting, diarrhea ).  Severe burning or pain with urination that is not improving.  FOLLOW-UP: You will need a follow-up appointment to monitor your progress. Call for this appointment at the number listed above. Usually the first appointment will be about three to fourteen days after your surgery.  We will ned to get you in in 1 week for removal of your stent  You may restart coumadin tommorrow   General Anesthesia General anesthesia is a sleep-like state of non-feeling produced by  medicines (anesthetics). General anesthesia prevents you from being alert and feeling pain during a medical procedure. Your caregiver may recommend general anesthesia if your procedure: Is long. Is painful or uncomfortable. Would be frightening to see or hear. Requires you to be still. Affects your breathing. Causes significant blood loss. LET YOUR CAREGIVER KNOW ABOUT: Allergies to food or medicine. Medicines taken, including vitamins, herbs, eyedrops, over-the-counter medicines, and creams. Use  of steroids (by mouth or creams). Previous problems with anesthetics or numbing medicines, including problems experienced by relatives. History of bleeding problems or blood clots. Previous surgeries and types of anesthetics received. Possibility of pregnancy, if this applies. Use of cigarettes, alcohol, or illegal drugs. Any health condition(s), especially diabetes, sleep apnea, and high blood pressure. RISKS AND COMPLICATIONS General anesthesia rarely causes complications. However, if complications do occur, they can be life threatening. Complications include: A lung infection. A stroke. A heart attack. Waking up during the procedure. When this occurs, the patient may be unable to move and communicate that he or she is awake. The patient may feel severe pain. Older adults and adults with serious medical problems are more likely to have complications than adults who are young and healthy. Some complications can be prevented by answering all of your caregiver's questions thoroughly and by following all pre-procedure instructions. It is important to tell your caregiver if any of the pre-procedure instructions, especially those related to diet, were not followed. Any food or liquid in the stomach can cause problems when you are under general anesthesia. BEFORE THE PROCEDURE Ask your caregiver if you will have to spend the night at the hospital. If you will not have to spend the night, arrange to have an adult drive you and stay with you for 24 hours. Follow your caregiver's instructions if you are taking dietary supplements or medicines. Your caregiver may tell you to stop taking them or to reduce your dosage. Do not smoke for as long as possible before your procedure. If possible, stop smoking 3-6 weeks before the procedure. Do not take new dietary supplements or medicines within 1 week of your procedure unless your caregiver approves them. Do not eat within 8 hours of your procedure or as directed  by your caregiver. Drink only clear liquids, such as water, black coffee (without milk or cream), and fruit juices (without pulp). Do not drink within 3 hours of your procedure or as directed by your caregiver. You may brush your teeth on the morning of the procedure, but make sure to spit out the toothpaste and water when finished. PROCEDURE  You will receive anesthetics through a mask, through an intravenous (IV) access tube, or through both. A doctor who specializes in anesthesia (anesthesiologist) or a nurse who specializes in anesthesia (nurse anesthetist) or both will stay with you throughout the procedure to make sure you remain unconscious. He or she will also watch your blood pressure, pulse, and oxygen levels to make sure that the anesthetics do not cause any problems. Once you are asleep, a breathing tube or mask may be used to help you breathe. AFTER THE PROCEDURE You will wake up after the procedure is complete. You may be in the room where the procedure was performed or in a recovery area. You may have a sore throat if a breathing tube was used. You may also feel: Dizzy. Weak. Drowsy. Confused. Nauseous. Cold. These are all normal responses and can be expected to last for up to 24 hours after the procedure is complete. A  caregiver will tell you when you are ready to go home. This will usually be when you are fully awake and in stable condition. Document Released: 03/21/2008 Document Revised: 04/29/2014 Document Reviewed: 04/12/2012 [facility] Patient Information 2015 [place], [place]. This information is not intended to replace advice given to you by your health care provider. Make sure you discuss any questions you have with your health care provider.

## 2015-07-09 NOTE — H&P (Signed)
Reason For Visit   Tammie [name] returns today for routine followup. Her history from her last visit in the fall 2015 is summarized in the section below. At that time she was complaining of some increased voiding issues and was having some nonspecific left-sided back discomfort. All of her stones were on the right side. At that time she was noted to have a moderate-sized upper and lower pole stone in the right kidney but no hydronephrosis. She was undergoing treatment for her lung cancer. She tells me that her renal function worsened secondary to her chemotherapy. It appears that her creatinine when checked in March 2016 was 2.2. She was originally told that she had about a 30% chance of living 5 years with her lung cancer. Thus far she has been okay, and on followup things have been relatively encouraging other than the decline in her renal function. Her overall voiding is better. She had been experiencing quite a bit of frequency and urgency, but that has resolved. Her urinalysis today is clear. Her blood pressure appears to be under good control.  On KUB imaging today she continues to have a stone in the lower pole of the right kidney measuring 8-9 mm. The upper pole stone now appears to be in position where it is in the renal pelvis of that right kidney.     History of Present Illness   Tammie [name] main issue has been nephrolithiasis and she had some metabolic reactive stone disease with increasing size of stones in her right kidney. She has had no right-sided abdominal or flank discomfort.   On limited right renal ultrasonography, her kidney measured 12.4 cm. There are several stones in the right kidney. They appear to be unchanged in size and location from previous imaging. No evidence of significant obstruction. On KUB imaging, the patient continues to have a 7 mm stone in her upper pole and an 8-9 mm stone in the lower pole. I do not see anything else of concern at this time.   Past Medical  History Problems  1. History of Arthritis 2. History of Asthma (J45.909) 3. History of Atrial fibrillation (I48.91) 4. History of hypercholesterolemia (Z86.39) 5. History of hypertension (Z86.79) 6. History of kidney stones (Z87.442) 7. History of Lung Cancer 8. History of Lung neoplasm (D49.1) 9. History of Skin Cancer 10. History of Stroke syndrome (I63.9)  Surgical History Problems  1. History of Breast Surgery 2. History of Cystoscopy With Insertion Of Ureteral Stent Right 3. History of Cystoscopy With Ureteroscopy With Lithotripsy 4. History of Dilation And Curettage 5. History of Hysterectomy 6. History of Lung Surgery 7. History of Nose Surgery 8. History of Tonsillectomy With Adenoidectomy  Current Meds 1. Acetaminophen ER 650 MG Oral Tablet Extended Release;  Therapy: (Recorded:19Apr2011) to Recorded 2. Albuterol Sulfate HFA 108 MCG/ACT AERS;  Therapy: (Recorded:13Oct2015) to Recorded 3. Calcium Carbonate 500 MG CHEW;  Therapy: (Recorded:13Oct2015) to Recorded 4. Compazine 10 MG Oral Tablet;  Therapy: (Recorded:13Oct2015) to Recorded 5. Dexamethasone 4 MG Oral Tablet;  Therapy: (Recorded:13Oct2015) to Recorded 6. DiphenhydrAMINE HCl - 25 MG Oral Tablet;  Therapy: (Recorded:10Apr2015) to Recorded 7. Fish Oil 1000 MG Oral Capsule;  Therapy: (Recorded:13Oct2015) to Recorded 8. Fluticasone Propionate 0.05 % External Cream;  Therapy: [id number] to Recorded 9. Folic Acid 1 MG Oral Tablet;  Therapy: (Recorded:13Oct2015) to Recorded 10. Garlic TABS;   Therapy: (Recorded:19Apr2011) to Recorded 11. Hydrochlorothiazide 12.5 MG Oral Capsule;   Therapy: [id number] to Recorded 12. Ketoconazole 2 % External Cream;   Therapy:  [id number] to Recorded 13. Metoprolol Tartrate 100 MG Oral Tablet;   Therapy: (Recorded:19Apr2011) to Recorded 14. Multivitamins TABS;   Therapy: (Recorded:19Apr2011) to Recorded 15. Mupirocin 2 % External Ointment;   Therapy: 01Apr2015 to  Recorded 16. OxyCODONE HCl - 5 MG Oral Capsule;   Therapy: (Recorded:13Oct2015) to Recorded 17. Simvastatin 10 MG Oral Tablet;   Therapy: 20Aug2012 to Recorded 18. Vitamin D3 1000 UNIT Oral Tablet;   Therapy: (Recorded:19Apr2011) to Recorded 19. Warfarin Sodium 5 MG Oral Tablet;   Therapy: (Recorded:19Apr2011) to Recorded  Allergies Medication  1. No Known Drug Allergies Non-Medication  2. Tape  Family History Problems  1. Family history of Death In The Family Father   74 car accident 2. Family history of Death In The Family Mother   40 3. Family history of Family Health Status Number Of Children   1 daughter 4. Family history of Laryngeal Cancer : Mother  Social History Problems  1. Denied: History of Alcohol Use 2. Denied: History of Caffeine Use 3. Former smoker [phone]) 4. Marital History - Widowed 5. Tobacco Use   Smoked for 30 years.  Quit 1991  Review of Systems Genitourinary, constitutional, skin, eye, otolaryngeal, hematologic/lymphatic, cardiovascular, pulmonary, endocrine, musculoskeletal, gastrointestinal, neurological and psychiatric system(s) were reviewed and pertinent findings if present are noted and are otherwise negative.  Genitourinary: urinary frequency, urinary urgency and nocturia, but no dysuria and no hematuria.  Gastrointestinal: heartburn and diarrhea, but no abdominal pain.  Constitutional: night sweats and feeling tired (fatigue), but no fever.  Integumentary: skin rash/lesion and pruritus.  ENT: sinus problems.  Respiratory: shortness of breath.  Musculoskeletal: back pain.  Neurological: headache.    Vitals Vital Signs [Data Includes: Last 1 Day]  Recorded: 23Jun2016 11:31AM  Height: 5 ft 10 in Weight: 277 lb  BMI Calculated: 39.75 BSA Calculated: 2.4 Blood Pressure: 115 / 65 Temperature: 98.5 F Heart Rate: 81  Physical Exam Constitutional: Well nourished and well developed . No acute distress.  Pulmonary: No respiratory  distress and normal respiratory rhythm and effort.  Cardiovascular: Heart rate and rhythm are normal . No peripheral edema.  Abdomen: The abdomen is soft and nontender. No masses are palpated. No CVA tenderness. No hernias are palpable. No hepatosplenomegaly noted.  Skin: Normal skin turgor, no visible rash and no visible skin lesions.  Neuro/Psych:. Mood and affect are appropriate.    Results/Data Urine [Data Includes: Last 1 Day]   [id number]  COLOR YELLOW   APPEARANCE CLEAR   SPECIFIC GRAVITY 1.025   pH 5.0   GLUCOSE NEG mg/dL  BILIRUBIN NEG   KETONE NEG mg/dL  BLOOD NEG   PROTEIN NEG mg/dL  UROBILINOGEN 0.2 mg/dL  NITRITE NEG   LEUKOCYTE ESTERASE NEG     Patient's right renal ultrasonography was performed. There was no evidence of solid renal mass. The kidney itself measured 11.1 cm. Renal parenchyma appeared normal thickness and echo patterns. There was some very minimal dilation at the renal pelvis. There was no evidence of any obvious severe hydronephrosis. A lower pole 7 mm stone was noted. There is an additional 7 mm stone in the right area of the renal pelvis UPJ region.   Assessment Assessed  1. Kidney stone on right side (N20.0) 2. Overactive bladder (N32.81)  Plan Kidney stone on right side  1. Follow-up Schedule Surgery Office  Follow-up  Status: Hold For - Appointment   Requested for: [phone] 2. RENAL U/S RIGHT; Status:Resulted - Requires Verification;   Done: [id number] 12:23PM  Discussion/Summary  Tammie [name] has had longstanding issues with nephrolithiasis. She has been known to have right-sided stone burden that has been asymptomatic. She currently continues to be asymptomatic with clear urine. My concern at this point is that her upper pole stone has now migrated to the renal pelvis. There is some very mild dilation of the renal pelvis but no gross hydronephrosis. I do not think the stone is probably contributing to her less than optimal renal function, but  it could be potentially a contributing component. I am concerned that this stone will become more problematic if left untreated. She is on anticoagulation for prior history of CVA. For that reason, I think it may be safer for her to proceed with flexible ureteroscopy and holmium laser lithotripsy rather than external shockwave lithotripsy. We talked about the pros and cons with intervening with surgical intervention versus ongoing observation and careful monitoring to be sure she does not develop worsening hydronephrosis. She was more interested in being proactive, and, therefore, we get her urgently on the schedule for the next several weeks for ureteroscopic procedure. We would plan on outpatient procedure but will be happy to monitor her if necessary.    Signatures Electronically signed by : [name], M.D.; Jun 19 2015  4:00PM EST

## 2015-07-09 NOTE — Interval H&P Note (Signed)
History and Physical Interval Note:  07/09/2015 9:40 AM  Tammie [name]  has presented today for surgery, with the diagnosis of RIGHT RENAL CALCULUS  The various methods of treatment have been discussed with the patient and family. After consideration of risks, benefits and other options for treatment, the patient has consented to  Procedure(s) with comments: CYSTOSCOPY WITH RETROGRADE PYELOGRAM, URETEROSCOPY AND STENT PLACEMENT (Right) - 60 MINS REQUESTED FOR THIS CASE     CYSTOSCOPY WITH HOLMIUM LASER LITHOTRIPSY (Right) as a surgical intervention .  The patient's history has been reviewed, patient examined, no change in status, stable for surgery.  I have reviewed the patient's chart and labs.  Questions were answered to the patient's satisfaction.     Rashad Obeid S

## 2015-07-09 NOTE — Transfer of Care (Signed)
Immediate Anesthesia Transfer of Care Note  Patient: Tammie [name]  Procedure(s) Performed: Procedure(s) with comments: CYSTOSCOPY WITH   URETEROSCOPY AND STENT PLACEMENT (Right) - 4 MINS REQUESTED FOR THIS CASE     CYSTOSCOPY WITH HOLMIUM LASER LITHOTRIPSY (Right)  Patient Location: PACU  Anesthesia Type:General  Level of Consciousness:  sedated, patient cooperative and responds to stimulation  Airway & Oxygen Therapy:Patient Spontanous Breathing and Patient connected to face mask oxgen  Post-op Assessment:  Report given to PACU RN and Post -op Vital signs reviewed and stable  Post vital signs:  Reviewed and stable  Last Vitals:  Filed Vitals:   07/09/15 0751  BP: 122/71  Pulse: 68  Temp: 36.7 C  Resp: 16    Complications: No apparent anesthesia complications

## 2015-07-09 NOTE — Anesthesia Preprocedure Evaluation (Addendum)
Anesthesia Evaluation  Patient identified by MRN, date of birth, ID band Patient awake    Reviewed: Allergy & Precautions, NPO status , Patient's Chart, lab work & pertinent test results  Airway Mallampati: II  TM Distance: >3 FB Neck ROM: Full    Dental  (+) Edentulous Upper   Pulmonary shortness of breath, asthma , pneumonia -, resolved, COPD COPD inhaler, former smoker,  breath sounds clear to auscultation  Pulmonary exam normal       Cardiovascular Exercise Tolerance: Good hypertension, Pt. on medications and Pt. on home beta blockers + Peripheral Vascular Disease Normal cardiovascular exam+ dysrhythmias Atrial Fibrillation Rhythm:Regular Rate:Normal  ECG: Afib   Neuro/Psych Memory problems since CVA CVA, Residual Symptoms negative psych ROS   GI/Hepatic negative GI ROS, Neg liver ROS, GERD-  Medicated,  Endo/Other  negative endocrine ROS  Renal/GU Renal InsufficiencyRenal diseaseCr 2.06 K 4.8  negative genitourinary   Musculoskeletal  (+) Arthritis -,   Abdominal (+) + obese,   Peds negative pediatric ROS (+)  Hematology  (+) anemia ,   Anesthesia Other Findings   Reproductive/Obstetrics negative OB ROS                          Anesthesia Physical Anesthesia Plan  ASA: III  Anesthesia Plan: General   Post-op Pain Management:    Induction: Intravenous  Airway Management Planned: LMA  Additional Equipment:   Intra-op Plan:   Post-operative Plan: Extubation in OR  Informed Consent: I have reviewed the patients History and Physical, chart, labs and discussed the procedure including the risks, benefits and alternatives for the proposed anesthesia with the patient or authorized representative who has indicated his/her understanding and acceptance.   Dental advisory given  Plan Discussed with: CRNA  Anesthesia Plan Comments:         Anesthesia Quick Evaluation

## 2015-07-09 NOTE — Op Note (Signed)
Preoperative diagnosis:multiple right renal calculi Postoperative diagnosis:same  Procedure:cystoscopy, flexible ureteroscopy, holmium laser lithotripsy, basketing the stone fragments and placement of JJ stent 6 French 24 cm without dangle string   Surgeon: Bernestine Amass M.D.  Anesthesia: Gen.  Indications:Ms. Tedder is 74 years of age with prior history of nephrolithiasis.  She has had known right-sided renal stone burden by recent imaging studies suggested a 9 mm stone now in renal pelvis.  She had some very mild dilation.  We suggested intervention and recommended ureteroscopy to deal with her complete stone burden.  Risks benefits discussed with her and she presents now for that procedure.     Technique and findings:patient was brought the operating room where she had sessile induction of general anesthesia.  She was placed in lithotomy position and prepped in usual manner.  Appropriate surgical timeout was performed.  Cystoscopy revealed an unremarkable bladder.  On fluoroscopy one could see to 8-10 mm stones are quite dense overlying the right renal shadow.  A guidewire was placed to the right renal unit.  Ureteroscopic access sheath was then placed with fluoroscopic guidance.  The digital flexible ureteroscope was then engaged and driven up to the renal pelvis.  Within the renal pelvis was a 9 mm stone.  This was broken up into approximately a dozen pieces utilizing the holmium laser lithotripter.  This was done at 0.8 J 8 Hz.  A second 9 mm stone was noted in a lower pole calyx.  This was similarly fractured.  Large pieces were basketed extracted.  No obvious Contusions or problems occurred.  Completion of the procedure a 6 French 24 cm double-J stent was placed with fluoroscopic as well as visual guidance.  The patient without recovery room in stable condition.

## 2015-07-09 NOTE — Anesthesia Procedure Notes (Signed)
Procedure Name: LMA Insertion Date/Time: 07/09/2015 10:15 AM Performed by: Maxwell Caul Pre-anesthesia Checklist: Patient identified, Emergency Drugs available, Suction available and Patient being monitored Patient Re-evaluated:Patient Re-evaluated prior to inductionOxygen Delivery Method: Circle system utilized Preoxygenation: Pre-oxygenation with 100% oxygen Intubation Type: IV induction LMA: LMA inserted LMA Size: 4.0 Number of attempts: 1 Placement Confirmation: positive ETCO2 and breath sounds checked- equal and bilateral Tube secured with: Tape Dental Injury: Teeth and Oropharynx as per pre-operative assessment

## 2015-07-09 NOTE — Anesthesia Postprocedure Evaluation (Signed)
  Anesthesia Post-op Note  Patient: Tammie [name]  Procedure(s) Performed: Procedure(s) (LRB): CYSTOSCOPY WITH   URETEROSCOPY AND STENT PLACEMENT (Right) CYSTOSCOPY WITH HOLMIUM LASER LITHOTRIPSY (Right)  Patient Location: PACU  Anesthesia Type: General  Level of Consciousness: awake and alert   Airway and Oxygen Therapy: Patient Spontanous Breathing  Post-op Pain: mild  Post-op Assessment: Post-op Vital signs reviewed, Patient's Cardiovascular Status Stable, Respiratory Function Stable, Patent Airway and No signs of Nausea or vomiting  Last Vitals:  Filed Vitals:   07/09/15 1251  BP: 147/62  Pulse: 72  Temp: 36.4 C  Resp: 16    Post-op Vital Signs: stable   Complications: No apparent anesthesia complications

## 2015-07-10 ENCOUNTER — Encounter (HOSPITAL_COMMUNITY): Payer: Self-pay | Admitting: Urology

## 2015-07-10 ENCOUNTER — Telehealth: Payer: Self-pay | Admitting: Physician Assistant

## 2015-07-12 LAB — AEROBIC CULTURE

## 2015-07-13 ENCOUNTER — Other Ambulatory Visit: Payer: Self-pay | Admitting: Physician Assistant

## 2015-07-13 MED ORDER — CIPROFLOXACIN HCL 500 MG PO TABS: 500.0000 mg | ORAL_TABLET | Freq: Two times a day (BID) | ORAL | Status: DC

## 2015-07-14 ENCOUNTER — Ambulatory Visit (INDEPENDENT_AMBULATORY_CARE_PROVIDER_SITE_OTHER): Payer: PPO | Admitting: Pharmacist

## 2015-07-14 DIAGNOSIS — I4891 Unspecified atrial fibrillation: Secondary | ICD-10-CM | POA: Diagnosis not present

## 2015-07-14 LAB — POCT INR: INR: 1.4

## 2015-07-14 NOTE — Patient Instructions (Signed)
Anticoagulation Dose Instructions as of 07/14/2015      Tammie [name] Tue Wed Thu Fri Sat   New Dose 2.5 mg 5 mg 2.5 mg 2.5 mg 2.5 mg 2.5 mg 2.5 mg    Description        Continue current warfarin dose to 1 tablet on mondays and 1/2 tablet all other days. (patient to have urinary stent removed Wednesday - July 20th)     INR was 1.4 today

## 2015-07-15 NOTE — Telephone Encounter (Signed)
Patient was seen yesterday.

## 2015-07-23 ENCOUNTER — Ambulatory Visit (HOSPITAL_COMMUNITY)
Admission: RE | Admit: 2015-07-23 | Discharge: 2015-07-23 | Disposition: A | Discharge status: Home / Self Care | Payer: PPO | Source: Ambulatory Visit | Attending: Internal Medicine | Admitting: Internal Medicine

## 2015-07-23 ENCOUNTER — Other Ambulatory Visit (HOSPITAL_BASED_OUTPATIENT_CLINIC_OR_DEPARTMENT_OTHER): Payer: PPO

## 2015-07-23 ENCOUNTER — Encounter (HOSPITAL_COMMUNITY): Payer: Self-pay

## 2015-07-23 DIAGNOSIS — T451X5A Adverse effect of antineoplastic and immunosuppressive drugs, initial encounter: Secondary | ICD-10-CM

## 2015-07-23 DIAGNOSIS — C50411 Malignant neoplasm of upper-outer quadrant of right female breast: Secondary | ICD-10-CM | POA: Diagnosis not present

## 2015-07-23 DIAGNOSIS — N289 Disorder of kidney and ureter, unspecified: Secondary | ICD-10-CM

## 2015-07-23 DIAGNOSIS — D6481 Anemia due to antineoplastic chemotherapy: Secondary | ICD-10-CM

## 2015-07-23 DIAGNOSIS — C3431 Malignant neoplasm of lower lobe, right bronchus or lung: Secondary | ICD-10-CM

## 2015-07-23 DIAGNOSIS — C3492 Malignant neoplasm of unspecified part of left bronchus or lung: Secondary | ICD-10-CM | POA: Diagnosis present

## 2015-07-23 LAB — CBC WITH DIFFERENTIAL/PLATELET
BASO%: 1.1 % (ref 0.0–2.0)
BASOS ABS: 0.1 10*3/uL (ref 0.0–0.1)
EOS%: 2.3 % (ref 0.0–7.0)
Eosinophils Absolute: 0.2 10*3/uL (ref 0.0–0.5)
HEMATOCRIT: 29.8 % — AB (ref 34.8–46.6)
HGB: 9.7 g/dL — ABNORMAL LOW (ref 11.6–15.9)
LYMPH%: 14.8 % (ref 14.0–49.7)
MCH: 27 pg (ref 25.1–34.0)
MCHC: 32.6 g/dL (ref 31.5–36.0)
MCV: 82.9 fL (ref 79.5–101.0)
MONO#: 0.6 10*3/uL (ref 0.1–0.9)
MONO%: 8.2 % (ref 0.0–14.0)
NEUT#: 5.1 10*3/uL (ref 1.5–6.5)
NEUT%: 73.6 % (ref 38.4–76.8)
Platelets: 287 10*3/uL (ref 145–400)
RBC: 3.6 10*6/uL — ABNORMAL LOW (ref 3.70–5.45)
RDW: 14.9 % — AB (ref 11.2–14.5)
WBC: 7 10*3/uL (ref 3.9–10.3)
lymph#: 1 10*3/uL (ref 0.9–3.3)

## 2015-07-28 ENCOUNTER — Encounter: Payer: Self-pay | Admitting: Physician Assistant

## 2015-07-28 ENCOUNTER — Ambulatory Visit (INDEPENDENT_AMBULATORY_CARE_PROVIDER_SITE_OTHER): Payer: PPO | Admitting: Physician Assistant

## 2015-07-28 VITALS — BP 128/61 | HR 67 | Temp 97.4°F | Ht 71.0 in | Wt 276.0 lb

## 2015-07-28 DIAGNOSIS — B958 Unspecified staphylococcus as the cause of diseases classified elsewhere: Secondary | ICD-10-CM | POA: Diagnosis not present

## 2015-07-28 DIAGNOSIS — I4891 Unspecified atrial fibrillation: Secondary | ICD-10-CM

## 2015-07-28 DIAGNOSIS — R609 Edema, unspecified: Secondary | ICD-10-CM

## 2015-07-28 DIAGNOSIS — L089 Local infection of the skin and subcutaneous tissue, unspecified: Secondary | ICD-10-CM | POA: Diagnosis not present

## 2015-07-28 LAB — POCT INR: INR: 2.3

## 2015-07-28 MED ORDER — CIPROFLOXACIN HCL 500 MG PO TABS: 500.0000 mg | ORAL_TABLET | Freq: Two times a day (BID) | ORAL | Status: DC

## 2015-07-28 NOTE — Progress Notes (Signed)
   Subjective:    Patient ID: Tammie [name], female    DOB: 28-Jul-1941, 74 y.o.   MRN: [id number]  HPI 74 y/o female with peripheral edema and numerous comorbidities presents for follow up of staph infection on left leg. Based on culture results, she was changed to Cipro. She has completed all of the antibiotic and states that her leg is much improved.     Review of Systems  Constitutional: Negative.   HENT: Negative.   Skin:       Occasionally feels warm, ttp  Much better but still has a knot on her left calf        Objective:   Physical Exam  Constitutional: She is oriented to person, place, and time. She appears well-developed and well-nourished.  Musculoskeletal: She exhibits edema (peripheral ) and tenderness (mild , anterior left shin ).  Neurological: She is alert and oriented to person, place, and time.  Skin: Skin is warm. No rash noted. There is erythema (mild erythema on anterior left shin). No pallor.  Psychiatric: She has a normal mood and affect. Her behavior is normal. Judgment and thought content normal.  Nursing note and vitals reviewed.         Assessment & Plan:  1. Staphylococcal infection of skin  - ciprofloxacin (CIPRO) 500 MG tablet; Take 1 tablet (500 mg total) by mouth 2 (two) times daily.  Dispense: 14 tablet; Refill: 0  - Advised patient to make sure she is taking her fluid pills as prescribed and suggested wearing compression hose for optimum healing.     RTO prn if no improvement after finishing antibiotic.   Christmas Faraci A. Benjamin Stain PA-C

## 2015-07-28 NOTE — Addendum Note (Signed)
Addended by: Earlene Plater on: 07/28/2015 11:47 AM   Modules accepted: Orders

## 2015-07-28 NOTE — Addendum Note (Signed)
Addended by: Cherre Robins on: 07/28/2015 12:06 PM   Modules accepted: Level of Service

## 2015-07-28 NOTE — Patient Instructions (Signed)
Anticoagulation Dose Instructions as of 07/28/2015      Tammie [name] Tue Wed Thu Fri Sat   New Dose 2.5 mg 5 mg 2.5 mg 2.5 mg 2.5 mg 2.5 mg 2.5 mg    Description        Continue current warfarin dose to 1 tablet on mondays and 1/2 tablet all other days.      INR was 2.3 today

## 2015-07-30 ENCOUNTER — Encounter: Payer: Self-pay | Admitting: Internal Medicine

## 2015-07-30 ENCOUNTER — Telehealth: Payer: Self-pay | Admitting: Internal Medicine

## 2015-07-30 ENCOUNTER — Ambulatory Visit (HOSPITAL_BASED_OUTPATIENT_CLINIC_OR_DEPARTMENT_OTHER): Payer: PPO | Admitting: Internal Medicine

## 2015-07-30 VITALS — BP 137/60 | HR 70 | Temp 98.3°F | Resp 19 | Ht 71.0 in | Wt 274.2 lb

## 2015-07-30 DIAGNOSIS — D6481 Anemia due to antineoplastic chemotherapy: Secondary | ICD-10-CM

## 2015-07-30 DIAGNOSIS — C3431 Malignant neoplasm of lower lobe, right bronchus or lung: Secondary | ICD-10-CM

## 2015-07-30 DIAGNOSIS — N289 Disorder of kidney and ureter, unspecified: Secondary | ICD-10-CM

## 2015-07-30 DIAGNOSIS — Z853 Personal history of malignant neoplasm of breast: Secondary | ICD-10-CM

## 2015-07-30 DIAGNOSIS — Z85118 Personal history of other malignant neoplasm of bronchus and lung: Secondary | ICD-10-CM
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
## 2015-07-30 NOTE — Telephone Encounter (Signed)
per pof to sch pt appt-gave pt copy of avs °

## 2015-08-04 ENCOUNTER — Other Ambulatory Visit: Payer: Self-pay | Admitting: Thoracic Surgery (Cardiothoracic Vascular Surgery)

## 2015-08-04 DIAGNOSIS — C3490 Malignant neoplasm of unspecified part of unspecified bronchus or lung: Secondary | ICD-10-CM

## 2015-08-05 ENCOUNTER — Telehealth: Payer: Self-pay | Admitting: Physician Assistant

## 2015-08-05 ENCOUNTER — Encounter: Payer: Self-pay | Admitting: Thoracic Surgery (Cardiothoracic Vascular Surgery)

## 2015-08-05 ENCOUNTER — Ambulatory Visit (INDEPENDENT_AMBULATORY_CARE_PROVIDER_SITE_OTHER): Payer: PPO | Admitting: Thoracic Surgery (Cardiothoracic Vascular Surgery)

## 2015-08-05 ENCOUNTER — Ambulatory Visit
Admission: RE | Admit: 2015-08-05 | Discharge: 2015-08-05 | Disposition: A | Discharge status: Home / Self Care | Payer: PPO | Source: Ambulatory Visit | Attending: Thoracic Surgery (Cardiothoracic Vascular Surgery) | Admitting: Thoracic Surgery (Cardiothoracic Vascular Surgery)

## 2015-08-05 VITALS — BP 118/69 | HR 68 | Resp 20 | Ht 71.0 in | Wt 274.0 lb

## 2015-08-05 DIAGNOSIS — Z09 Encounter for follow-up examination after completed treatment for conditions other than malignant neoplasm: Secondary | ICD-10-CM

## 2015-08-05 DIAGNOSIS — C3490 Malignant neoplasm of unspecified part of unspecified bronchus or lung: Secondary | ICD-10-CM

## 2015-08-05 DIAGNOSIS — R911 Solitary pulmonary nodule: Secondary | ICD-10-CM | POA: Diagnosis not present

## 2015-08-05 DIAGNOSIS — C3411 Malignant neoplasm of upper lobe, right bronchus or lung: Secondary | ICD-10-CM
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
## 2015-08-06 ENCOUNTER — Ambulatory Visit (INDEPENDENT_AMBULATORY_CARE_PROVIDER_SITE_OTHER): Payer: PPO | Admitting: Physician Assistant

## 2015-08-06 ENCOUNTER — Encounter: Payer: Self-pay | Admitting: Physician Assistant

## 2015-08-06 VITALS — BP 111/66 | HR 70 | Temp 97.0°F | Ht 71.0 in | Wt 276.8 lb

## 2015-08-06 DIAGNOSIS — L309 Dermatitis, unspecified: Secondary | ICD-10-CM

## 2015-08-06 DIAGNOSIS — L03116 Cellulitis of left lower limb: Secondary | ICD-10-CM | POA: Diagnosis not present

## 2015-08-06 MED ORDER — TRIAMCINOLONE ACETONIDE 0.1 % EX CREA: TOPICAL_CREAM | CUTANEOUS | Status: DC

## 2015-08-06 NOTE — Telephone Encounter (Signed)
Appointment given for today at 3:10pm

## 2015-08-06 NOTE — Progress Notes (Signed)
   Subjective:    Patient ID: Tammie [name], female    DOB: 25-Nov-1941, 74 y.o.   MRN: [id number]  HPI  74 y/o female presents for follow up of staph infection of left lower leg. She has taken 2 rounds of Cipro based on culture results but states that she is still having some redness. She wore the compression stockings as advised after her last appointment but complains that the redness worsened and it caused her leg to itch.    Review of Systems  Skin:       Redness on left shin Itching and cracking skin on left leg   All other systems reviewed and are negative.      Objective:   Physical Exam  Constitutional: She is oriented to person, place, and time. She appears well-developed and well-nourished. No distress.  Pulmonary/Chest: Effort normal.  Musculoskeletal: She exhibits edema (peripheral edema nonpitting bilaterally ). She exhibits no tenderness.  Neurological: She is alert and oriented to person, place, and time.  Skin: Skin is dry. She is not diaphoretic. There is erythema.  Much improvement on left shin with scaling. PIH, which I have explained may be permanent   Psychiatric: She has a normal mood and affect. Her behavior is normal. Judgment and thought content normal.  Nursing note and vitals reviewed.         Assessment & Plan:  1. Cellulitis of left lower extremity  - Aerobic culture - will treat if culture indicates. Otherwise, I feel that there is no remaining infection  2. Dermatitis  - triamcinolone cream (KENALOG) 0.1 %; Apply to AA of left leg BID  Dispense: 80 g; Refill: 3   Continue all meds Labs pending Health Maintenance reviewed Diet and exercise encouraged RTO pending culture results   Alane Hanssen A. Benjamin Stain PA-C

## 2015-08-06 NOTE — Telephone Encounter (Signed)
Patient ntbs for additional culture and reassessment if not clear Tammie [name] A. Benjamin Stain PA-C

## 2015-08-10 LAB — AEROBIC CULTURE

## 2015-08-11 ENCOUNTER — Ambulatory Visit (INDEPENDENT_AMBULATORY_CARE_PROVIDER_SITE_OTHER): Payer: PPO | Admitting: Pharmacist

## 2015-08-11 ENCOUNTER — Other Ambulatory Visit: Payer: Self-pay | Admitting: Physician Assistant

## 2015-08-11 DIAGNOSIS — L089 Local infection of the skin and subcutaneous tissue, unspecified: Secondary | ICD-10-CM

## 2015-08-11 DIAGNOSIS — I4891 Unspecified atrial fibrillation: Secondary | ICD-10-CM | POA: Diagnosis not present

## 2015-08-11 LAB — POCT INR: INR: 2.6

## 2015-08-11 MED ORDER — CLINDAMYCIN HCL 300 MG PO CAPS: 300.0000 mg | ORAL_CAPSULE | Freq: Three times a day (TID) | ORAL | Status: DC

## 2015-08-11 NOTE — Patient Instructions (Signed)
Anticoagulation Dose Instructions as of 08/11/2015      [name] Tue Wed Thu Fri Sat   New Dose 2.5 mg 5 mg 2.5 mg 2.5 mg 2.5 mg 2.5 mg 2.5 mg    Description        Continue current warfarin dose to 1 tablet on mondays and 1/2 tablet all other days.      INR was 2.6 today

## 2015-08-27 ENCOUNTER — Encounter: Payer: Self-pay | Admitting: Physician Assistant

## 2015-08-27 ENCOUNTER — Ambulatory Visit (INDEPENDENT_AMBULATORY_CARE_PROVIDER_SITE_OTHER): Payer: PPO | Admitting: Physician Assistant

## 2015-08-27 VITALS — BP 108/56 | HR 69 | Temp 97.1°F | Ht 71.0 in | Wt 276.0 lb

## 2015-08-27 DIAGNOSIS — L03116 Cellulitis of left lower limb: Secondary | ICD-10-CM

## 2015-08-27 MED ORDER — MUPIROCIN 2 % EX OINT: TOPICAL_OINTMENT | CUTANEOUS | Status: DC

## 2015-08-27 NOTE — Patient Instructions (Signed)
AM as soon as you get out of bed  1. Clean area with Purell Hand sanitizer.  2. Apply small amount of Mupirocin ointment to area on leg.  3. Put on compression stockings  PM at bedtime 1. Take off stockings 2. Clean area with Purell hand sanitizer.  3. Apply mupirocin ointment.

## 2015-08-27 NOTE — Progress Notes (Signed)
   Subjective:    Patient ID: Tammie [name], female    DOB: 1941/09/26, 74 y.o.   MRN: [id number]  HPI 74 y/o female with numerous co morbidities including CRF, h/o cancer with chemo treatment, chronic peripheral edema presents for f/u of cellulitis with staph infection in LLE. She has taken 2 rounds of Cipro and Clindomycin TID. She states that her leg is somewhat improved but not better.     Review of Systems  Constitutional: Negative.   HENT: Negative.   Eyes: Negative.   Respiratory: Negative.   Cardiovascular: Negative.   Gastrointestinal: Negative.   Endocrine: Negative.   Genitourinary: Negative.   Musculoskeletal: Negative.   Skin:       Itching of LLE        Objective:   Physical Exam  Constitutional: She is oriented to person, place, and time. She appears well-developed and well-nourished. No distress.  Musculoskeletal: Normal range of motion. She exhibits edema (peripheral bilateral , nonpitting ).  Neurological: She is alert and oriented to person, place, and time.  Skin: Skin is warm. She is not diaphoretic. There is erythema.  Inflammation and erythematous area on LLE, approximately 10cm x 11cm   Psychiatric: She has a normal mood and affect. Her behavior is normal. Judgment and thought content normal.  Nursing note and vitals reviewed.         Assessment & Plan:  1. Cellulitis of leg, left - Patient is resistant to wearing compression hose. However, I have discussed in depth the importance of wearing the compression hose for decreased edema and optimal healing of the cellulitis. She agrees to wear the hose until her follow up in 2 weeks.   She should clean the area twice daily with Purell hand sanitizer, apply mupirocin ointment and then compression hose. She may leave the hose off at bedtime.   - mupirocin ointment (BACTROBAN) 2 %; Apply to AA BID x 14 days  Dispense: 30 g; Refill: 1   If no improvement, I will likely refer her to a vascular specialist  for further eval. since the cellulitis will not resolve .   Emillie Chasen A. Benjamin Stain PA-C

## 2015-09-09 ENCOUNTER — Encounter: Payer: Self-pay | Admitting: Pharmacist Clinician (PhC)/ Clinical Pharmacy Specialist

## 2015-09-10 ENCOUNTER — Ambulatory Visit (INDEPENDENT_AMBULATORY_CARE_PROVIDER_SITE_OTHER): Payer: PPO | Admitting: Physician Assistant

## 2015-09-10 ENCOUNTER — Encounter: Payer: Self-pay | Admitting: Physician Assistant

## 2015-09-10 VITALS — BP 104/57 | HR 63 | Temp 97.1°F | Ht 71.0 in | Wt 279.0 lb

## 2015-09-10 DIAGNOSIS — L03116 Cellulitis of left lower limb: Secondary | ICD-10-CM | POA: Diagnosis not present

## 2015-09-10 DIAGNOSIS — I4891 Unspecified atrial fibrillation: Secondary | ICD-10-CM

## 2015-09-10 LAB — POCT INR: INR: 2.5

## 2015-09-10 NOTE — Patient Instructions (Signed)
Anticoagulation Dose Instructions as of 09/10/2015      Tammie [name] Tue Wed Thu Fri Sat   New Dose 2.5 mg 5 mg 2.5 mg 2.5 mg 2.5 mg 2.5 mg 2.5 mg    Description        Continue current warfarin dose to 1 tablet on mondays and 1/2 tablet all other days.      INR was 2.5 today

## 2015-09-10 NOTE — Progress Notes (Signed)
   Subjective:    Patient ID: Tammie [name], female    DOB: 1941-08-05, 74 y.o.   MRN: [id number]  HPI 74 y/o female presents for followup of cellulitis with skin infection on LLE. She states that her leg is doing much better, however, not totally improved.     Review of Systems  Constitutional: Negative.   Gastrointestinal: Negative.   Skin: Positive for color change. Negative for wound.       Objective:   Physical Exam  Constitutional: She is oriented to person, place, and time.  Musculoskeletal: She exhibits edema (chronic peripheral ). She exhibits no tenderness.  Neurological: She is alert and oriented to person, place, and time.  Skin: There is erythema.  PIH on BLE   Psychiatric: She has a normal mood and affect. Her behavior is normal. Judgment and thought content normal.          Assessment & Plan:  1. Atrial fibrillation  - POCT INR  2. Atrial fibrillation, unspecified - INR  3. Cellulitis of leg, left  - Aerobic culture to determine if add'l antibiotic treatment is needed. Otherwise, use compression hose. I have discussed to Tammie [name] that due to her co-morbidities of DM, lung and breast CA with tx of chemo and radiatiion in addition to her chronic peripheral edema that her healing process with be slow. She verbalizes  Understanding.    RTC prn   Tiffany A. Benjamin Stain PA-C

## 2015-09-12 LAB — AEROBIC CULTURE

## 2015-10-14 ENCOUNTER — Ambulatory Visit (INDEPENDENT_AMBULATORY_CARE_PROVIDER_SITE_OTHER): Payer: PPO | Admitting: Pharmacist Clinician (PhC)/ Clinical Pharmacy Specialist

## 2015-10-14 DIAGNOSIS — R609 Edema, unspecified: Secondary | ICD-10-CM

## 2015-10-14 DIAGNOSIS — I4891 Unspecified atrial fibrillation: Secondary | ICD-10-CM

## 2015-10-14 DIAGNOSIS — R600 Localized edema: Secondary | ICD-10-CM

## 2015-10-14 LAB — POCT INR: INR: 2.5

## 2015-10-14 MED ORDER — FUROSEMIDE 40 MG PO TABS: 40.0000 mg | ORAL_TABLET | Freq: Two times a day (BID) | ORAL | Status: DC

## 2015-11-10 IMAGING — US US EXTREM LOW VENOUS*L*
1 series · 13 of 24 positions shown · non-contrast
Comparison: None

CLINICAL DATA: 73-year-old female with left leg pain



[Series 1: us extrem low venous*left* · 0.08mm/px · 13 of 36 slices shown]
[im 1/36]
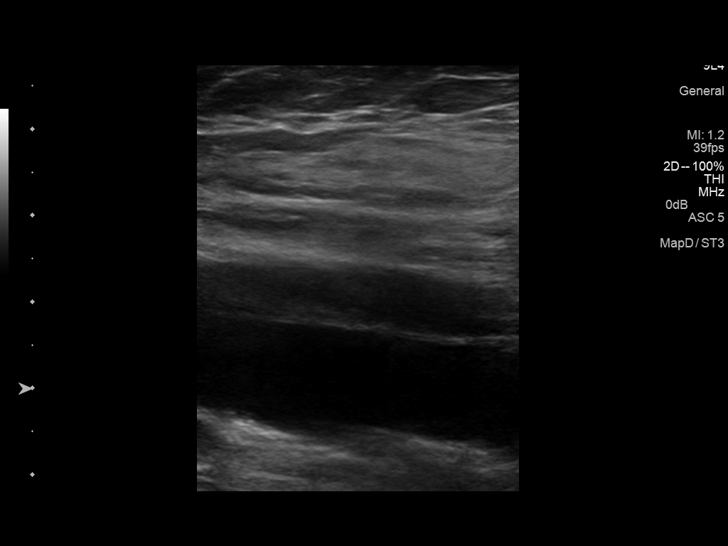
[im 4/36]
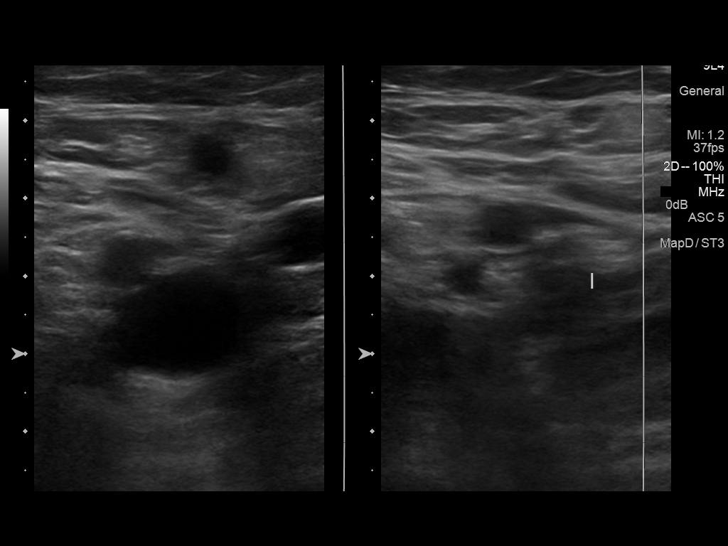
[im 7/36]
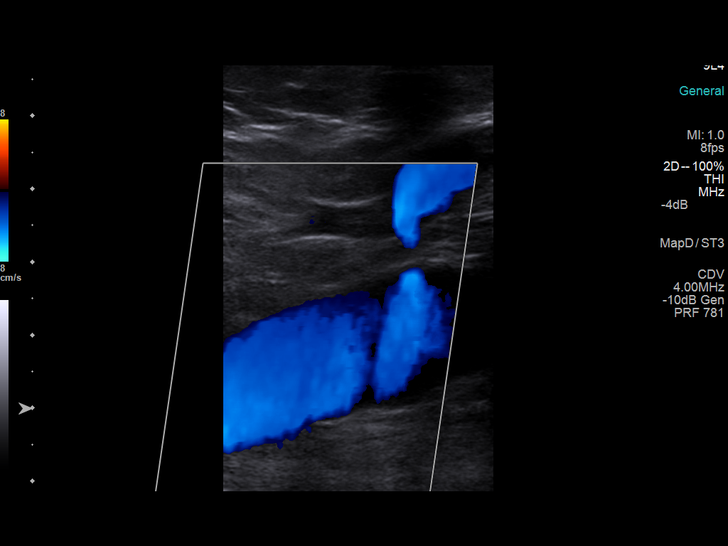
[im 10/36]
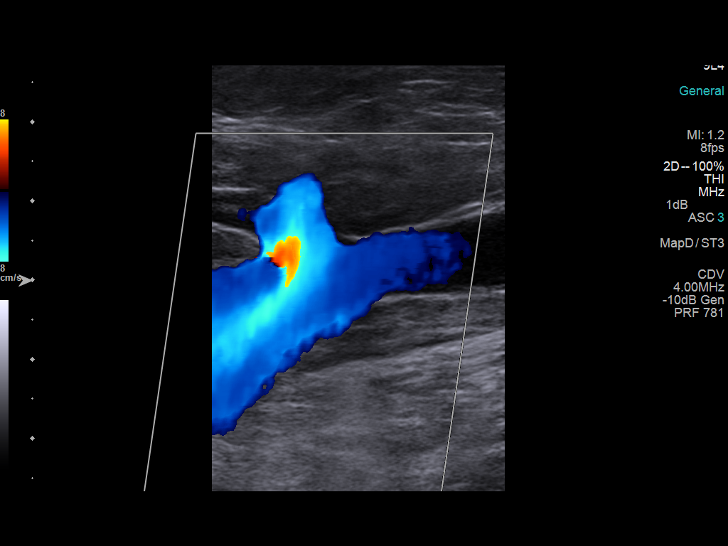
[im 13/36]
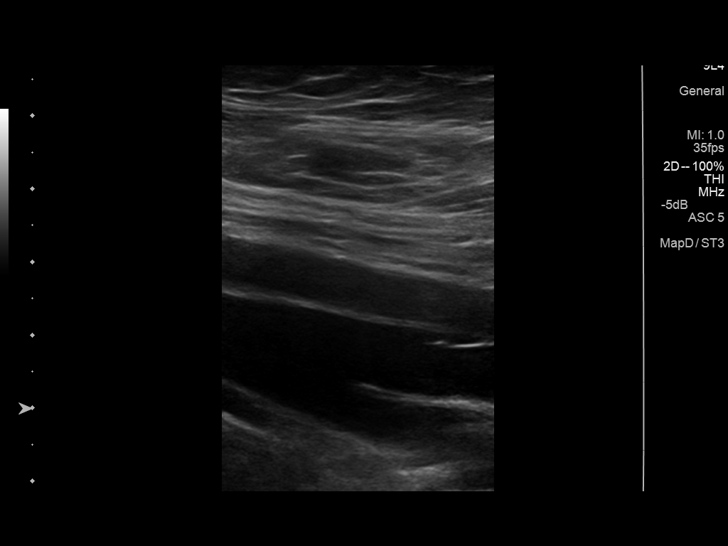
[im 16/36]
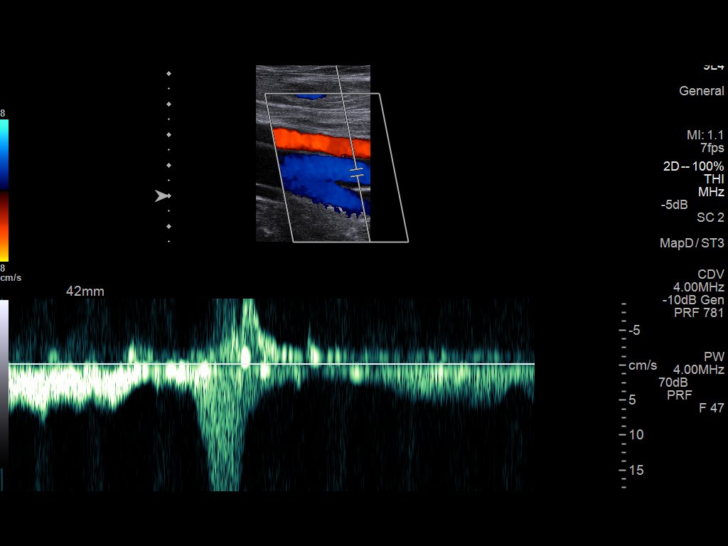
[im 19/36]
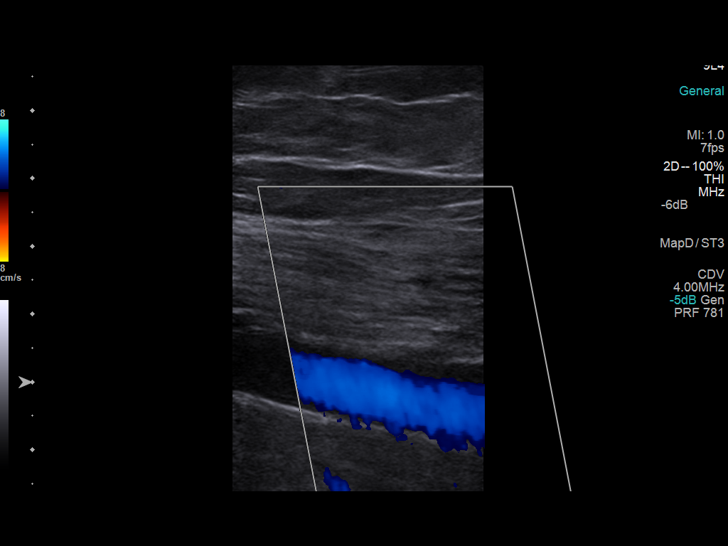
[im 20/36]
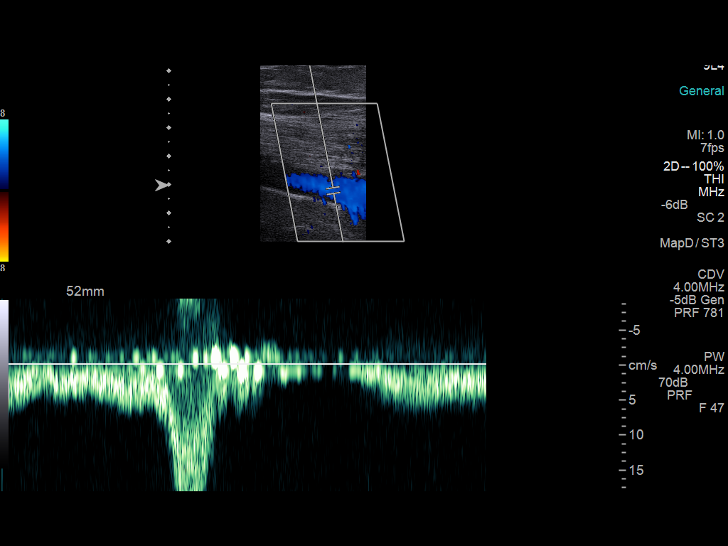
[im 23/36]
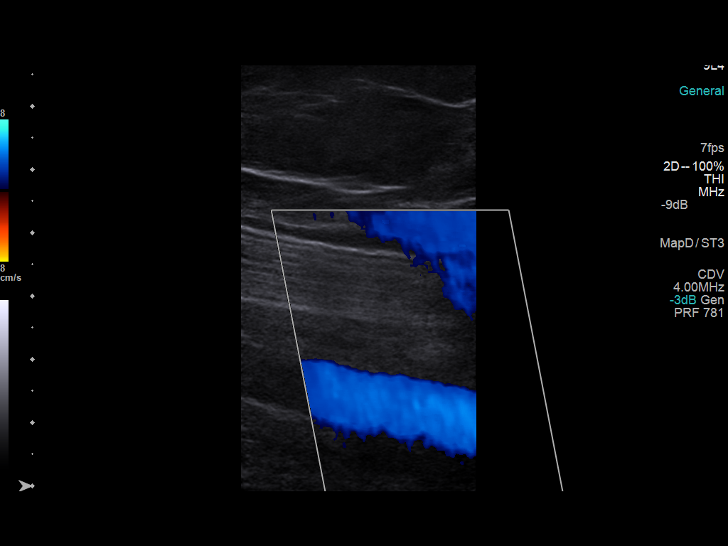
[im 26/36]
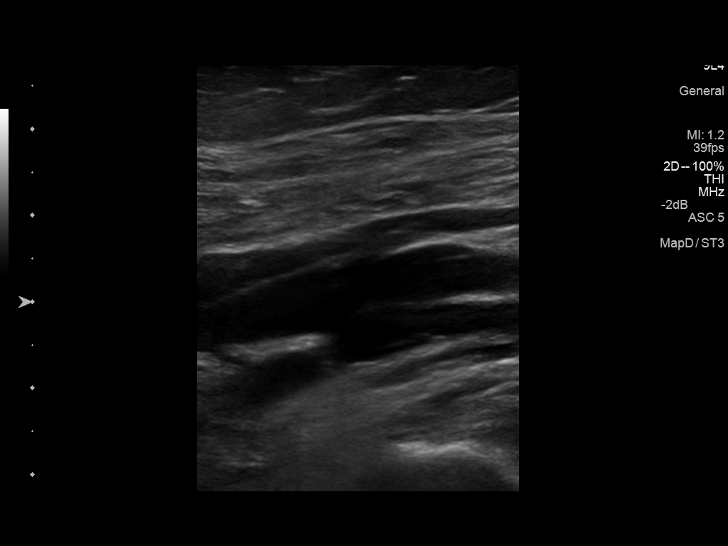
[im 29/36]
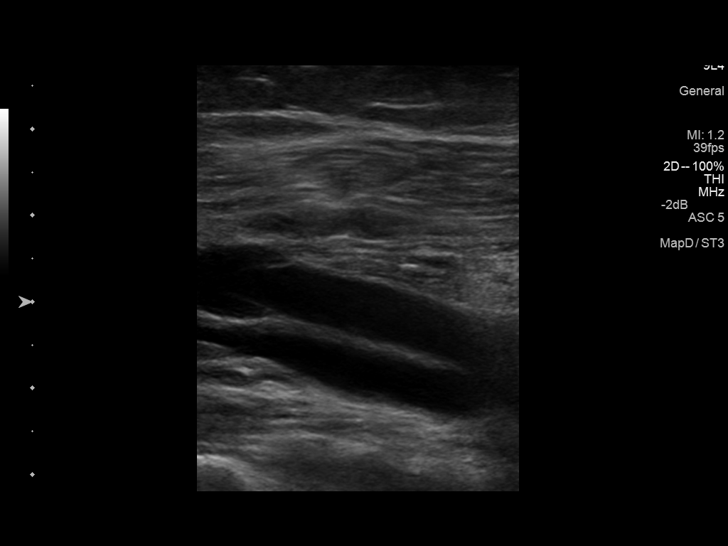
[im 32/36]
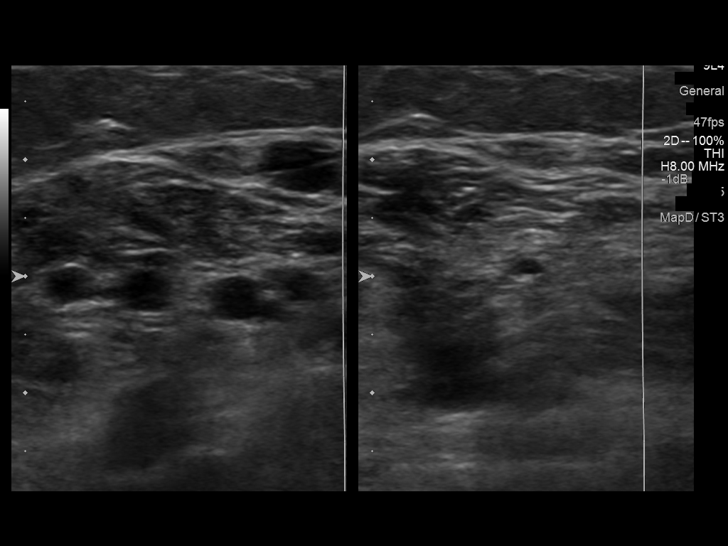
[im 36/36]
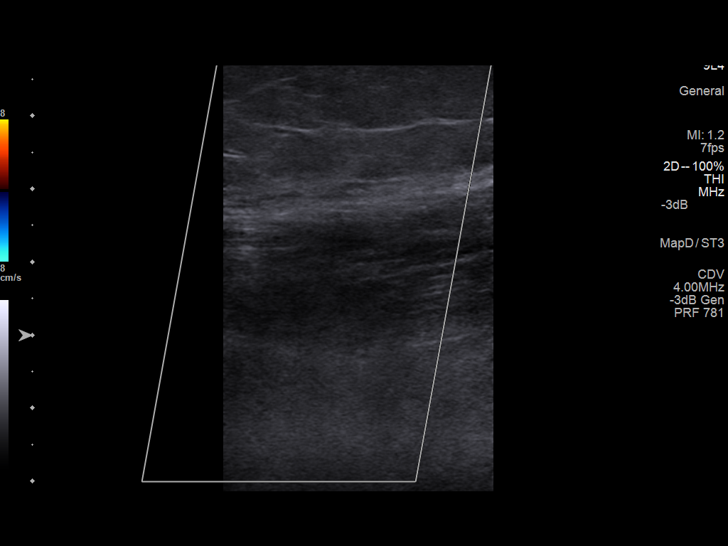

[13 of 24 positions shown; findings below may reference images not displayed]

FINDINGS: Contralateral Common Femoral Vein: Respiratory phasicity is normal
and symmetric with the symptomatic side. No evidence of thrombus.
Normal compressibility.

Common Femoral Vein: No evidence of thrombus. Normal
compressibility, respiratory phasicity and response to augmentation.

Saphenofemoral Junction: No evidence of thrombus. Normal
compressibility and flow on color Doppler imaging.

Profunda Femoral Vein: No evidence of thrombus. Normal
compressibility and flow on color Doppler imaging.

Femoral Vein: No evidence of thrombus. Normal compressibility,
respiratory phasicity and response to augmentation.

Popliteal Vein: No evidence of thrombus. Normal compressibility,
respiratory phasicity and response to augmentation.

Calf Veins: No evidence of thrombus. Normal compressibility and flow
on color Doppler imaging.

Superficial Great Saphenous Vein: No evidence of thrombus. Normal
compressibility and flow on color Doppler imaging.
IMPRESSION: No evidence of deep venous thrombosis.

## 2015-11-18 IMAGING — CT CT CHEST W/O CM
2 of 4 series · 15 of 36 positions shown, 18 images · non-contrast
Comparison: 10/22/2013.

CLINICAL DATA: Lung cancer followup.  Breast cancer.

EXAM:
CT CHEST WITHOUT CONTRAST
TECHNIQUE: Multidetector CT imaging of the chest was performed following the
standard protocol without IV contrast..

[Series 2: chest w/o st · axial · non-contrast · 0.96mm/px · z∈[-224,+46]mm · 12 of 64 slices shown, 15 images]
[im 5/64  mediastinal]
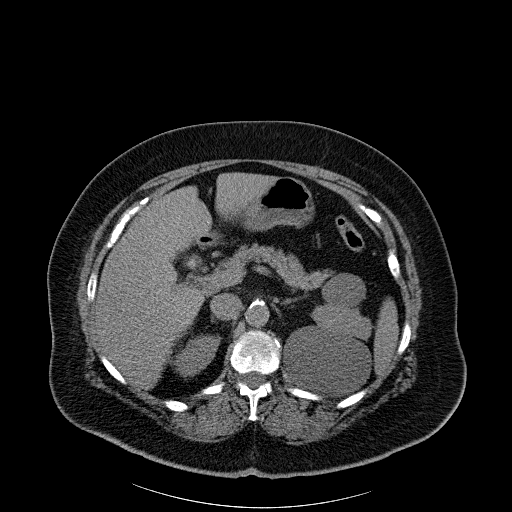
[im 5/64  lung]
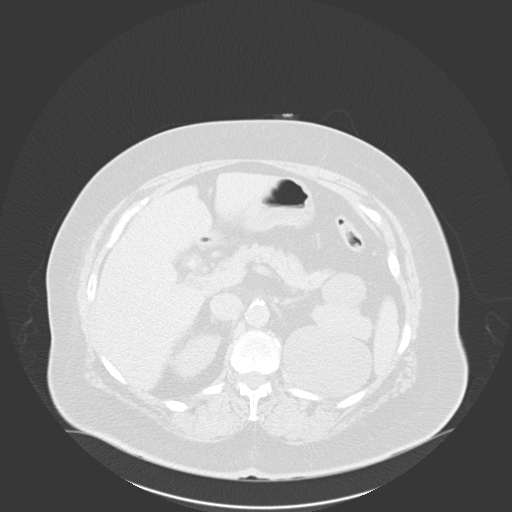
[im 10/64  lung]
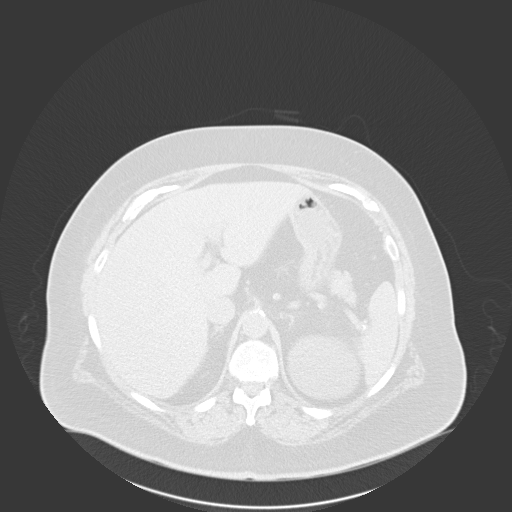
[im 15/64  lung]
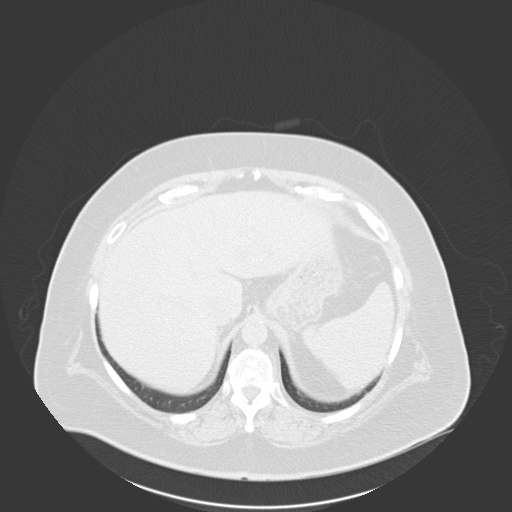
[im 20/64  lung]
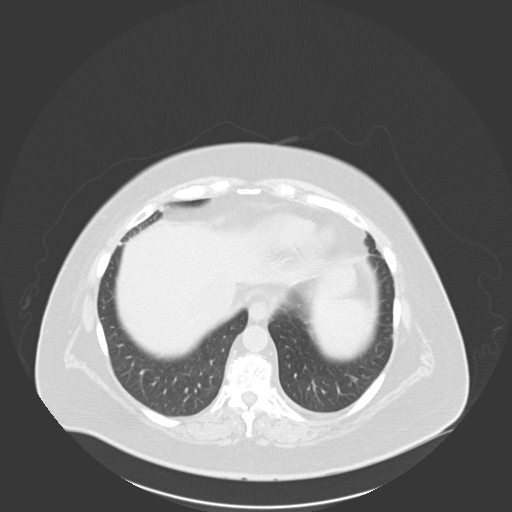
[im 25/64  mediastinal]
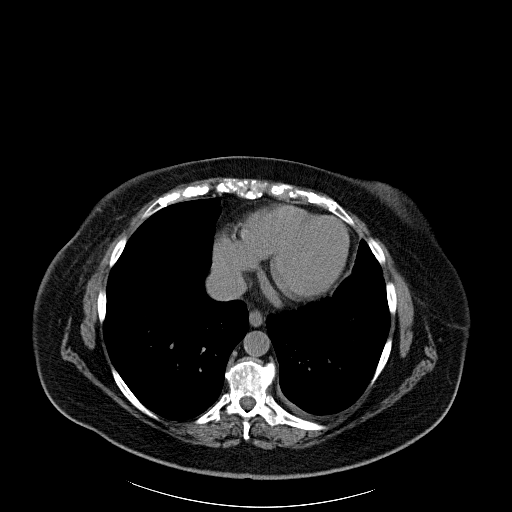
[im 25/64  lung]
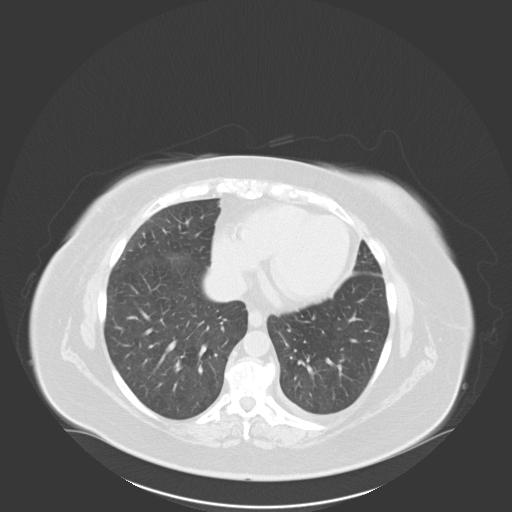
[im 30/64  lung]
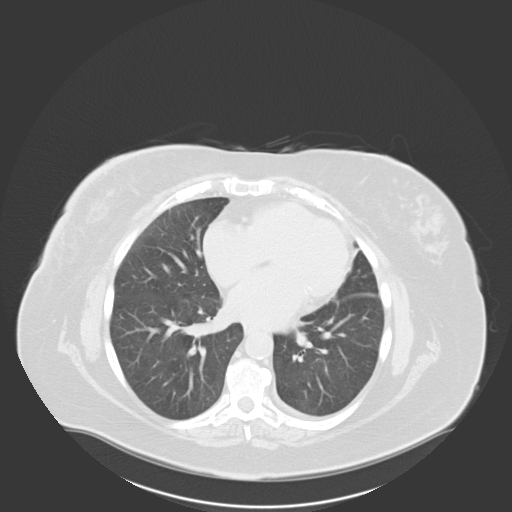
[im 34/64  lung]
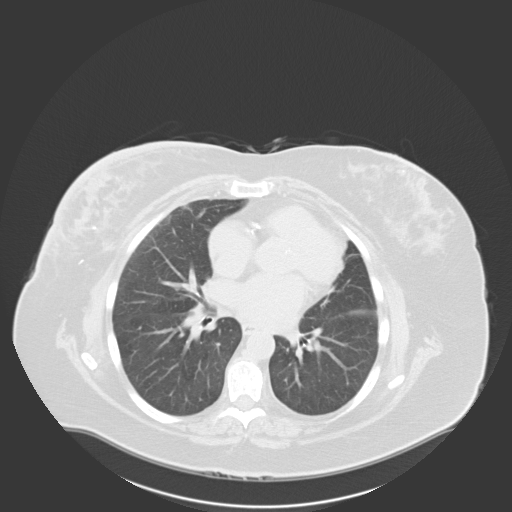
[im 39/64  lung]
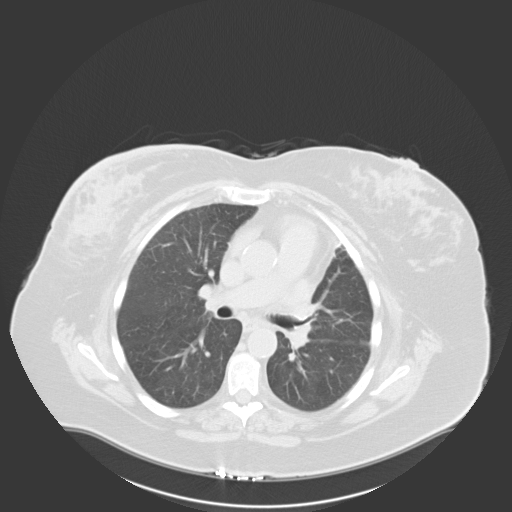
[im 44/64  mediastinal]
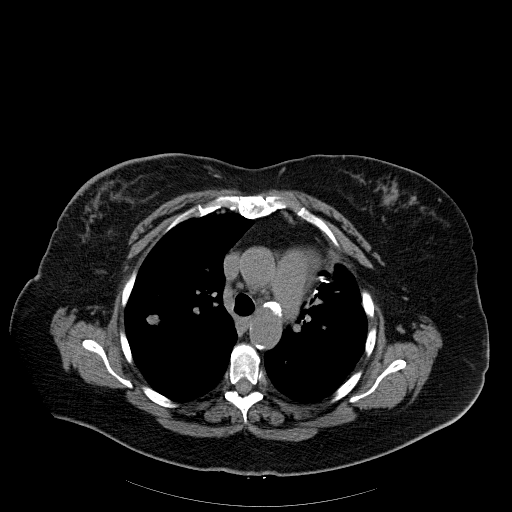
[im 44/64  lung]
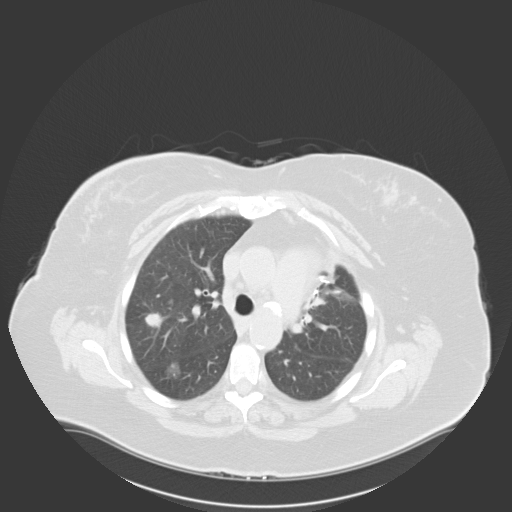
[im 49/64  lung]
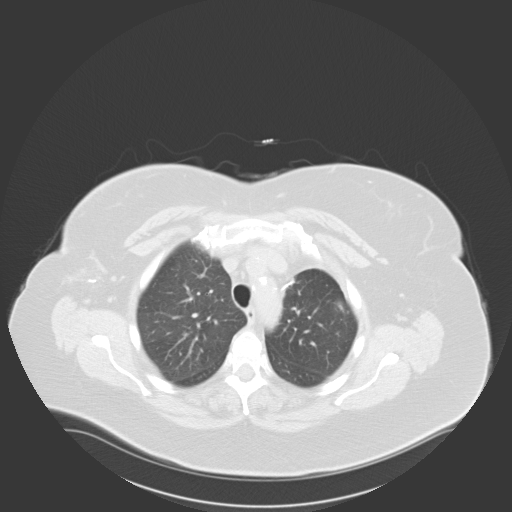
[im 54/64  lung]
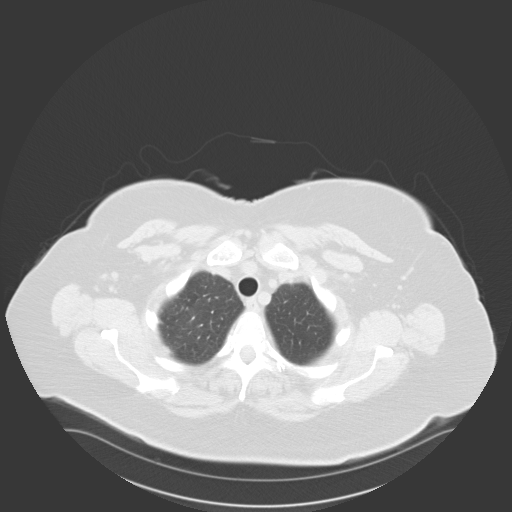
[im 59/64  lung]
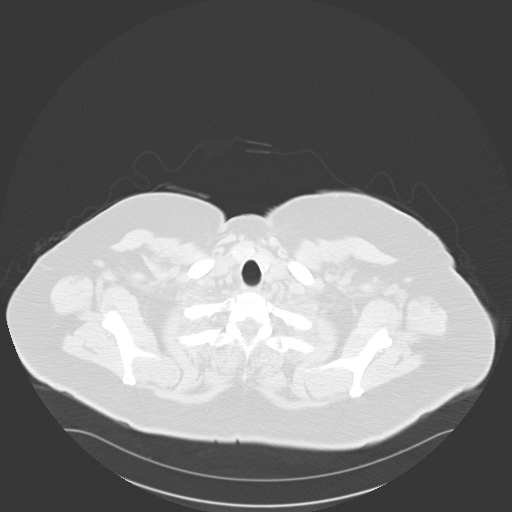

[Series 602: <mpr thick range> · coronal · 0.96mm/px · 3 of 101 slices shown]
[im 21/101  lung]
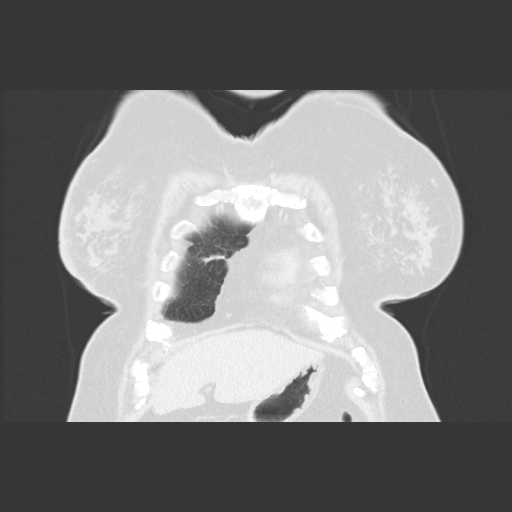
[im 41/101  lung]
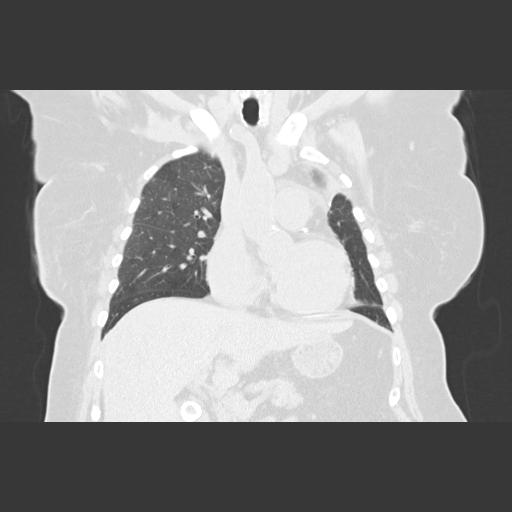
[im 61/101  lung]
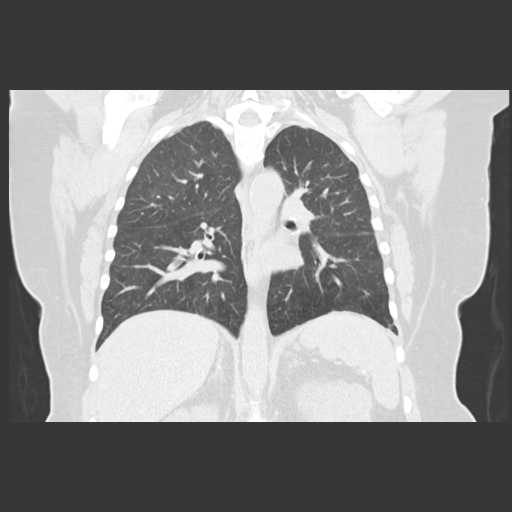

[15 of 36 positions shown; findings below may reference images not displayed]

FINDINGS: No pleural effusion identified. Index nodule within the right upper
lobe measures 1.6 cm, image 21/ series 5. Previously 1.2 cm.
Ground-glass attenuating nodule in the superior segment of the right
lower lobe 1.7 cm, image 22/ series 5. Previously 1.6 cm. Right
middle lobe nodule measures 8 mm and is unchanged from previous
exam, image 32/series 5. Postoperative change and volume loss from
the left upper lobe is again noted and appears similar to previous
exam.

The heart size is normal. No pericardial effusion. Calcified
atherosclerotic disease involves the thoracic aorta as well as the
RCA, LAD coronary arteries. No pathologically enlarged mediastinal
or hilar lymph nodes identified.

Stable enlarged right supraclavicular lymph node measuring 1.7 cm.
No axillary adenopathy. Post treatment change involving the right
breast noted. Next

Incidental imaging through the upper abdomen shows a 2.3 cm
gallstone. Cysts are noted within the upper pole the left kidney.
There is a nonobstructing stone within the upper pole the right
kidney measuring 8 mm. Review of the visualized osseous structures
is significant for mild thoracic spondylosis.
IMPRESSION: 1. Right upper lobe pulmonary nodule has increased in size from
previous exam. This now measures 1.6 cm findings are concerning for
bronchogenic carcinoma. Consider further evaluation with PET-CT.
2. Stable ground-glass attenuating nodule within the superior
segment of right lower lobe. This has remained unchanged since
03/01/2011.
[DATE]. Atherosclerotic disease including multi vessel coronary artery
calcifications.
4. Gallstones.
5. Nephrolithiasis.

## 2015-11-24 ENCOUNTER — Ambulatory Visit (INDEPENDENT_AMBULATORY_CARE_PROVIDER_SITE_OTHER): Payer: PPO | Admitting: Pharmacist

## 2015-11-24 DIAGNOSIS — Z23 Encounter for immunization: Secondary | ICD-10-CM

## 2015-11-24 DIAGNOSIS — I4891 Unspecified atrial fibrillation: Secondary | ICD-10-CM

## 2015-11-24 LAB — POCT INR: INR: 2.6

## 2015-11-24 NOTE — Patient Instructions (Signed)
Anticoagulation Dose Instructions as of 11/24/2015      Tammie [name] Tue Wed Thu Fri Sat   New Dose 2.5 mg 5 mg 2.5 mg 2.5 mg 2.5 mg 2.5 mg 2.5 mg    Description        Continue current warfarin dose to 1 tablet on mondays and 1/2 tablet all other days.        INR today was 2.6

## 2015-12-01 ENCOUNTER — Other Ambulatory Visit: Payer: Self-pay | Admitting: *Deleted

## 2015-12-01 ENCOUNTER — Ambulatory Visit (HOSPITAL_COMMUNITY)
Admission: RE | Admit: 2015-12-01 | Discharge: 2015-12-01 | Disposition: A | Discharge status: Home / Self Care | Payer: PPO | Source: Ambulatory Visit | Attending: Internal Medicine | Admitting: Internal Medicine

## 2015-12-01 ENCOUNTER — Encounter (HOSPITAL_COMMUNITY): Payer: Self-pay

## 2015-12-01 ENCOUNTER — Other Ambulatory Visit (HOSPITAL_BASED_OUTPATIENT_CLINIC_OR_DEPARTMENT_OTHER): Payer: PPO

## 2015-12-01 ENCOUNTER — Other Ambulatory Visit: Payer: Self-pay | Admitting: Medical Oncology

## 2015-12-01 DIAGNOSIS — C3431 Malignant neoplasm of lower lobe, right bronchus or lung: Secondary | ICD-10-CM

## 2015-12-01 DIAGNOSIS — Z85118 Personal history of other malignant neoplasm of bronchus and lung: Secondary | ICD-10-CM | POA: Diagnosis not present

## 2015-12-01 DIAGNOSIS — R911 Solitary pulmonary nodule: Secondary | ICD-10-CM | POA: Diagnosis not present

## 2015-12-01 DIAGNOSIS — I2510 Atherosclerotic heart disease of native coronary artery without angina pectoris: Secondary | ICD-10-CM | POA: Insufficient documentation

## 2015-12-01 DIAGNOSIS — I7090 Unspecified atherosclerosis: Secondary | ICD-10-CM | POA: Diagnosis not present

## 2015-12-01 LAB — CBC WITH DIFFERENTIAL/PLATELET
BASO%: 1.1 % (ref 0.0–2.0)
Basophils Absolute: 0.1 10*3/uL (ref 0.0–0.1)
EOS%: 2.4 % (ref 0.0–7.0)
Eosinophils Absolute: 0.2 10*3/uL (ref 0.0–0.5)
HCT: 31.8 % — ABNORMAL LOW (ref 34.8–46.6)
HGB: 10.1 g/dL — ABNORMAL LOW (ref 11.6–15.9)
LYMPH%: 18 % (ref 14.0–49.7)
MCH: 25.9 pg (ref 25.1–34.0)
MCHC: 31.9 g/dL (ref 31.5–36.0)
MCV: 81.2 fL (ref 79.5–101.0)
MONO#: 0.7 10*3/uL (ref 0.1–0.9)
MONO%: 8.2 % (ref 0.0–14.0)
NEUT%: 70.3 % (ref 38.4–76.8)
NEUTROS ABS: 6.3 10*3/uL (ref 1.5–6.5)
Platelets: 230 10*3/uL (ref 145–400)
RBC: 3.92 10*6/uL (ref 3.70–5.45)
RDW: 16.3 % — ABNORMAL HIGH (ref 11.2–14.5)
WBC: 9 10*3/uL (ref 3.9–10.3)
lymph#: 1.6 10*3/uL (ref 0.9–3.3)

## 2015-12-01 LAB — COMPREHENSIVE METABOLIC PANEL
ALBUMIN: 3.5 g/dL (ref 3.5–5.0)
ALK PHOS: 59 U/L (ref 40–150)
ALT: 13 U/L (ref 0–55)
AST: 16 U/L (ref 5–34)
Anion Gap: 9 mEq/L (ref 3–11)
BILIRUBIN TOTAL: 0.36 mg/dL (ref 0.20–1.20)
BUN: 34.1 mg/dL — AB (ref 7.0–26.0)
CALCIUM: 9.6 mg/dL (ref 8.4–10.4)
CO2: 25 mEq/L (ref 22–29)
Chloride: 105 mEq/L (ref 98–109)
Creatinine: 2.1 mg/dL — ABNORMAL HIGH (ref 0.6–1.1)
EGFR: 23 mL/min/{1.73_m2} — AB (ref >90)
GLUCOSE: 101 mg/dL (ref 70–140)
POTASSIUM: 5.2 meq/L — AB (ref 3.5–5.1)
Sodium: 140 mEq/L (ref 136–145)
TOTAL PROTEIN: 6.9 g/dL (ref 6.4–8.3)

## 2015-12-04 ENCOUNTER — Ambulatory Visit: Payer: PPO | Admitting: Internal Medicine

## 2015-12-09 ENCOUNTER — Telehealth: Payer: Self-pay | Admitting: Internal Medicine

## 2015-12-09 ENCOUNTER — Encounter: Payer: Self-pay | Admitting: Internal Medicine

## 2015-12-09 ENCOUNTER — Ambulatory Visit (HOSPITAL_BASED_OUTPATIENT_CLINIC_OR_DEPARTMENT_OTHER): Payer: PPO | Admitting: Internal Medicine

## 2015-12-09 VITALS — BP 119/57 | HR 63 | Temp 98.0°F | Resp 22 | Ht 71.0 in | Wt 278.0 lb

## 2015-12-09 DIAGNOSIS — N289 Disorder of kidney and ureter, unspecified: Secondary | ICD-10-CM | POA: Diagnosis not present

## 2015-12-09 DIAGNOSIS — Z853 Personal history of malignant neoplasm of breast: Secondary | ICD-10-CM

## 2015-12-09 DIAGNOSIS — D6481 Anemia due to antineoplastic chemotherapy: Secondary | ICD-10-CM

## 2015-12-09 DIAGNOSIS — C50411 Malignant neoplasm of upper-outer quadrant of right female breast: Secondary | ICD-10-CM

## 2015-12-09 DIAGNOSIS — Z85118 Personal history of other malignant neoplasm of bronchus and lung: Secondary | ICD-10-CM | POA: Diagnosis not present

## 2015-12-09 DIAGNOSIS — C3431 Malignant neoplasm of lower lobe, right bronchus or lung: Secondary | ICD-10-CM
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
## 2015-12-09 NOTE — Telephone Encounter (Signed)
Gave and printed appt sched and avs for pt for March 2017 °

## 2015-12-28 IMAGING — CT NM PET TUM IMG INITIAL (PI) SKULL BASE T - THIGH
7 series · 25 of 25 positions shown · non-contrast
Comparison: 09/22/2011 PET-CT and 05/02/2014 chest CT

CLINICAL DATA: Subsequent treatment strategy for Lung cancer.

EXAM:
NUCLEAR MEDICINE PET SKULL BASE TO THIGH
TECHNIQUE: 14.3 mCi F-18 FDG was injected intravenously. Full-ring PET imaging
was performed from the skull base to thigh after the radiotracer. CT
data was obtained and used for attenuation correction and anatomic
localization.
FASTING BLOOD GLUCOSE:  Value: 110 mg/dl

[Series 3: pet sk_thigh ac · axial · 5.0mm · 4.07mm/px · z∈[-1040,-168]mm · 6 of 219 slices shown]
[im 1/219]
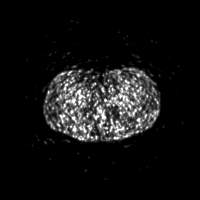
[im 44/219]
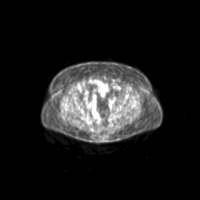
[im 88/219]
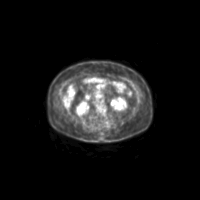
[im 131/219]
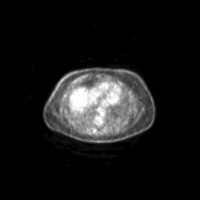
[im 175/219]
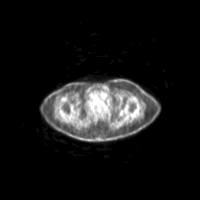
[im 219/219]
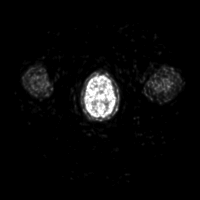

[Series 4: ct sk_thigh 5.0 hd_fov · axial · 5.0mm · 1.17mm/px · z∈[-1040,-168]mm · 5 of 219 slices shown]
[im 1/219]
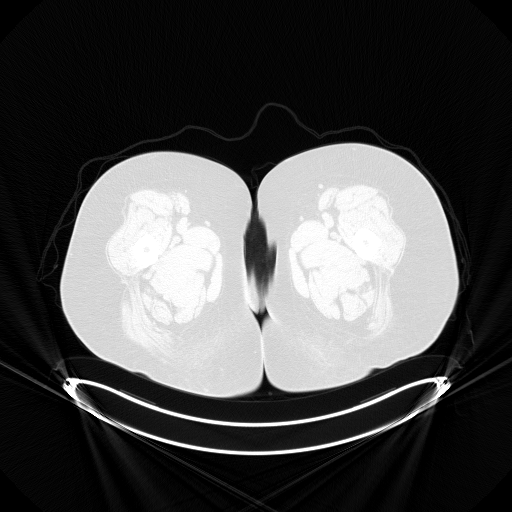
[im 55/219]
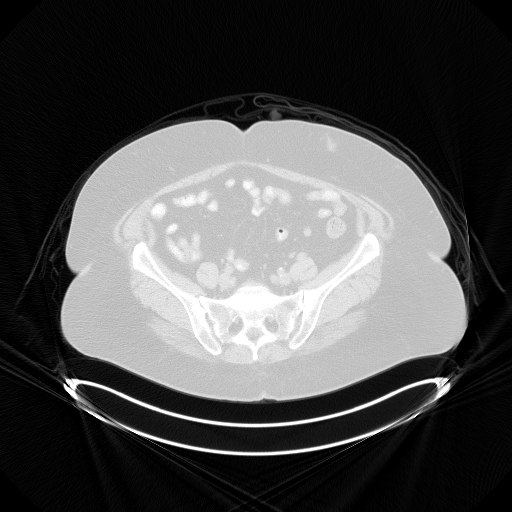
[im 110/219]
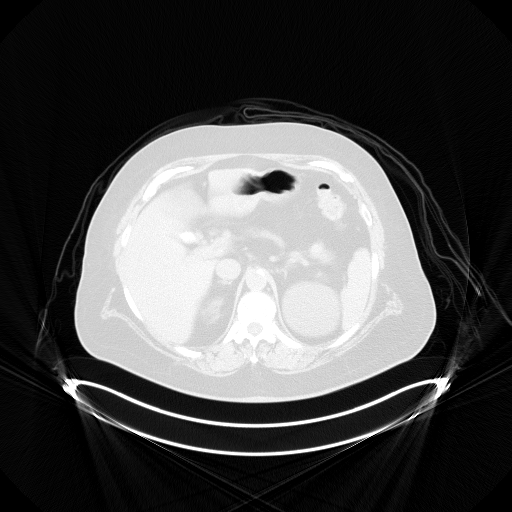
[im 164/219]
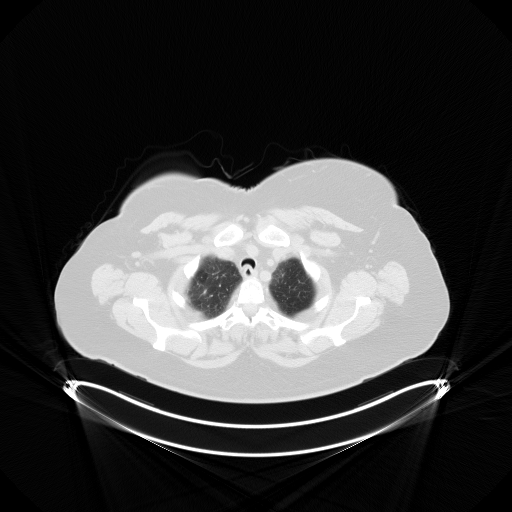
[im 219/219  brain]
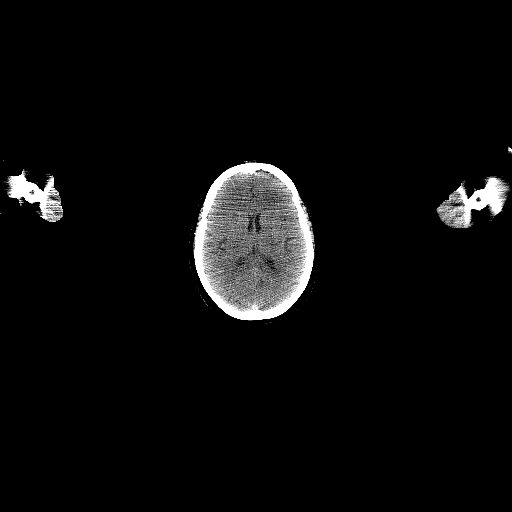

[Series 6: ct sk_thigh 5.0 b70f (id)_bone · axial · 5.0mm · 0.69mm/px · 1 of 63 slices shown]
[im 1/63  bone]
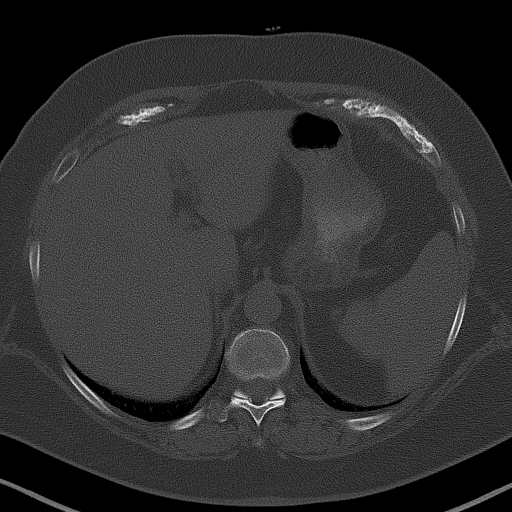

[Series 8: pet sk_thigh nac · axial · 5.0mm · 4.07mm/px · z∈[-1040,-168]mm · 5 of 219 slices shown]
[im 1/219]
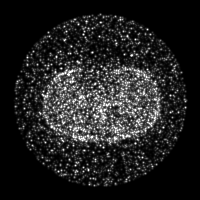
[im 55/219]
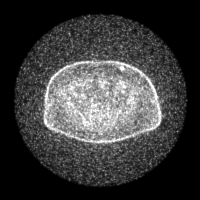
[im 110/219]
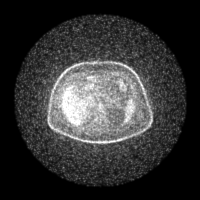
[im 164/219]
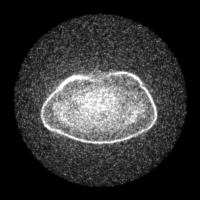
[im 219/219]
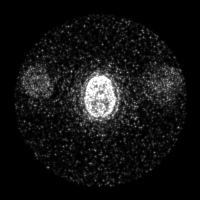

[Series 604: mip collection<mip range> · coronal · 1.81mm/px · 1 of 32 slices shown]
[im 1/32]
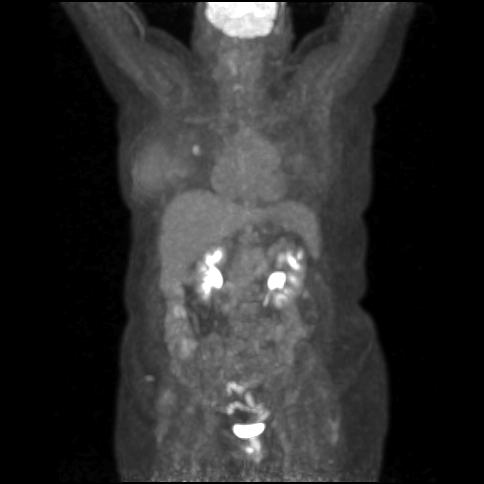

[Series 605: range-ct sk_thigh 5.0 hd_fov-cor-<alpha range> · 2 of 102 slices shown]
[im 1/102]
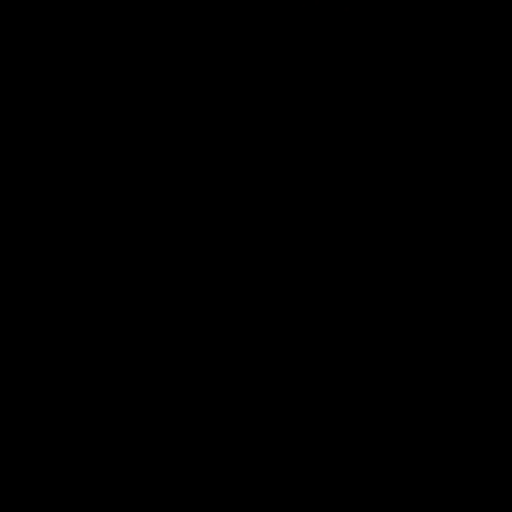
[im 102/102]
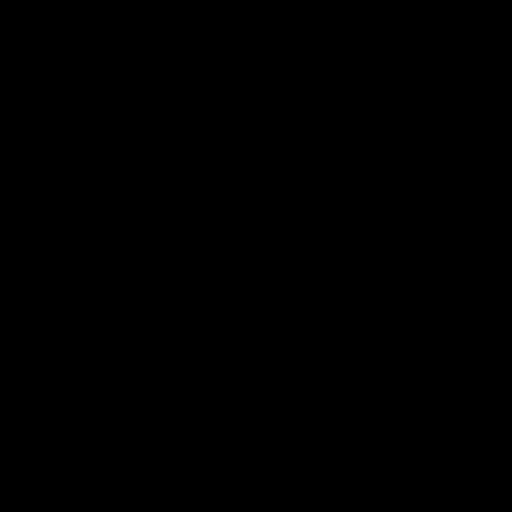

[Series 606: range-ct sk_thigh 5.0 hd_fov-tra-<alpha range> · 5 of 210 slices shown]
[im 1/210]
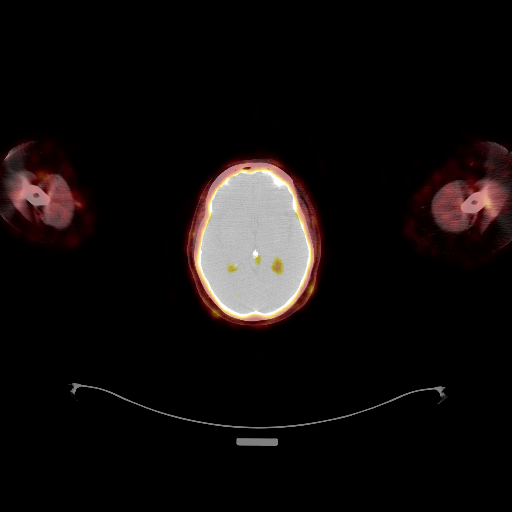
[im 53/210]
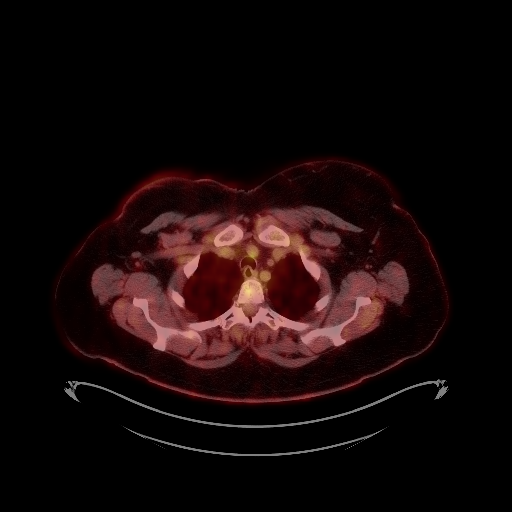
[im 105/210]
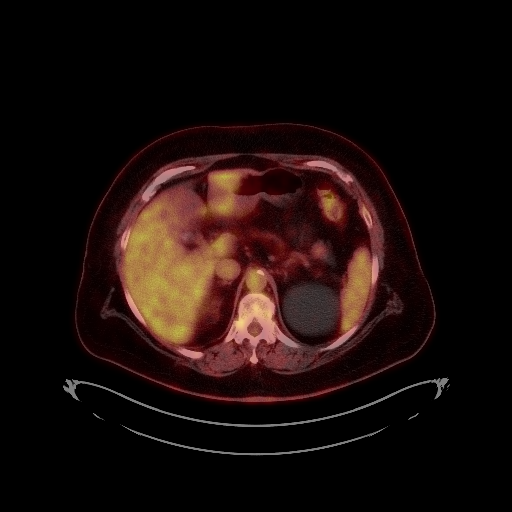
[im 157/210]
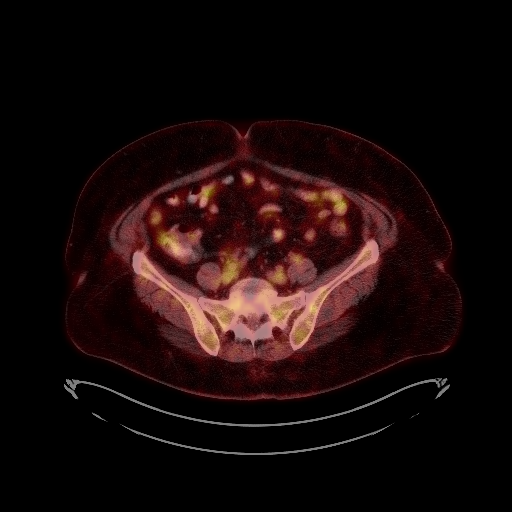
[im 210/210]
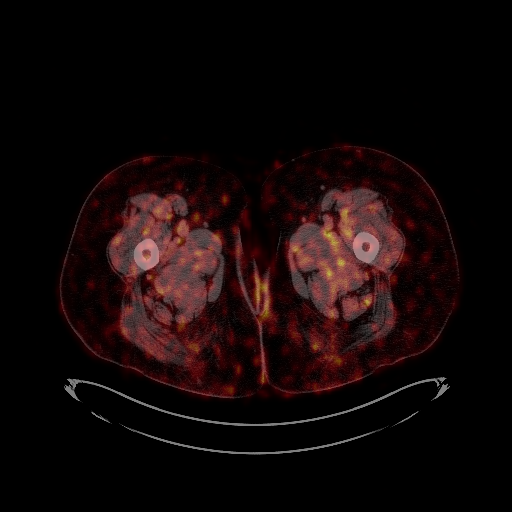

[25 of 25 positions shown; findings below may reference images not displayed]

FINDINGS: NECK

No hypermetabolic lymph nodes in the neck.

CHEST

The 16 mm right upper lobe pulmonary nodule is hypermetabolic with
SUV max of 6.0 this is consistent with a neoplastic process. Several
smaller ground-glass opacities/semi solid nodules are noted in the
right lung. The 16 mm nodule in the superior segment of the right
lower and the 2 right middle lobe nodules could be lobe grade
adenocarcinomas ([facility]). Close followup is recommended.

No metabolically active or enlarged mediastinal or hilar lymph
nodes.

ABDOMEN/PELVIS

No abnormal hypermetabolic activity within the liver, pancreas,
adrenal glands, or spleen. No hypermetabolic lymph nodes in the
abdomen or pelvis.

Small foci of hypermetabolism noted in the subcutaneous fat likely
related to recent injections.

SKELETON

No findings for osseous metastatic disease.
IMPRESSION: 1. 16 mm right upper lobe pulmonary nodule is hypermetabolic and
consistent with a neoplastic process. No mediastinal or hilar
lymphadenopathy.
2. Three smaller ground-glass opacities/semi solid nodules in the
right lung are not hypermetabolic but could be low-grade neoplasms.
Recommend close CT follow-up.
3. No findings for metastatic disease in the abdomen/pelvis or in
the bony structures.

## 2016-01-05 ENCOUNTER — Encounter: Payer: Self-pay | Admitting: Pharmacist

## 2016-01-06 ENCOUNTER — Encounter: Payer: Self-pay | Admitting: Physician Assistant

## 2016-01-21 DIAGNOSIS — J449 Chronic obstructive pulmonary disease, unspecified: Secondary | ICD-10-CM | POA: Diagnosis not present

## 2016-01-23 ENCOUNTER — Ambulatory Visit (INDEPENDENT_AMBULATORY_CARE_PROVIDER_SITE_OTHER): Payer: PPO | Admitting: Pharmacist

## 2016-01-23 DIAGNOSIS — I4891 Unspecified atrial fibrillation: Secondary | ICD-10-CM | POA: Diagnosis not present

## 2016-01-23 LAB — POCT INR: INR: 3.7

## 2016-01-23 MED ORDER — FLUTICASONE PROPIONATE 0.05 % EX CREA: TOPICAL_CREAM | Freq: Two times a day (BID) | CUTANEOUS | Status: DC

## 2016-01-23 MED ORDER — LEVALBUTEROL TARTRATE 45 MCG/ACT IN AERO: 2.0000 | INHALATION_SPRAY | Freq: Four times a day (QID) | RESPIRATORY_TRACT | Status: DC | PRN

## 2016-01-23 NOTE — Patient Instructions (Signed)
Anticoagulation Dose Instructions as of 01/23/2016      Tammie [name] Tue Wed Thu Fri Sat   New Dose 2.5 mg 2.5 mg 2.5 mg 2.5 mg 2.5 mg 2.5 mg 2.5 mg    Description        No warfarin today - Friday, January 27th.  Then decrease dose to 1/2 tablet daily.     INR was 3.7 today

## 2016-01-29 ENCOUNTER — Other Ambulatory Visit: Payer: Self-pay | Admitting: Nurse Practitioner

## 2016-01-29 MED ORDER — ALBUTEROL SULFATE HFA 108 (90 BASE) MCG/ACT IN AERS: 2.0000 | INHALATION_SPRAY | Freq: Four times a day (QID) | RESPIRATORY_TRACT | Status: DC | PRN

## 2016-01-31 IMAGING — CR DG CHEST 2V
2 series · 2 of 2 positions shown · non-contrast
Comparison: Prior study from 06/11/2014.

CLINICAL DATA: Preop for right lung nodules.

EXAM:
CHEST  2 VIEW

[w chest pa]
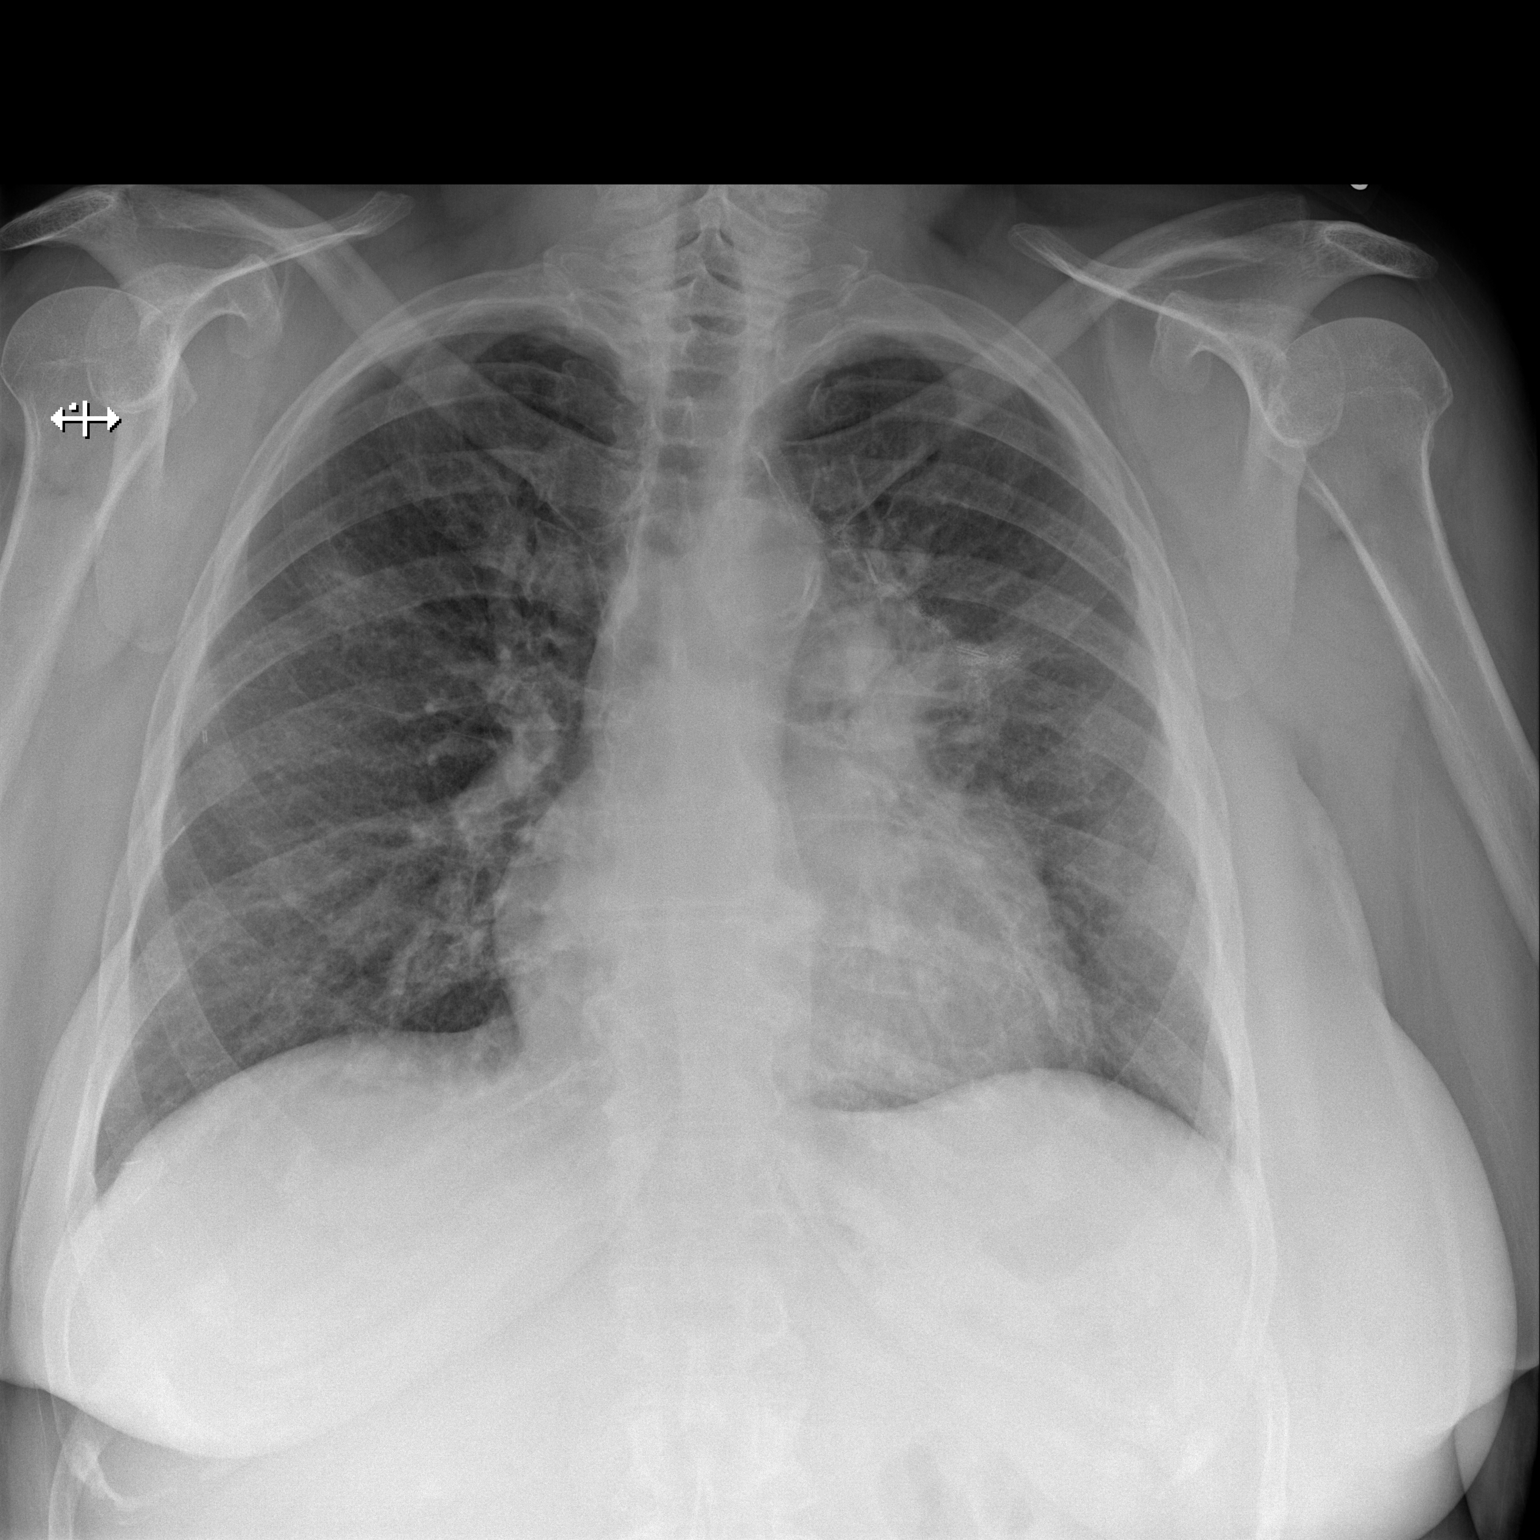

[w chest lat]
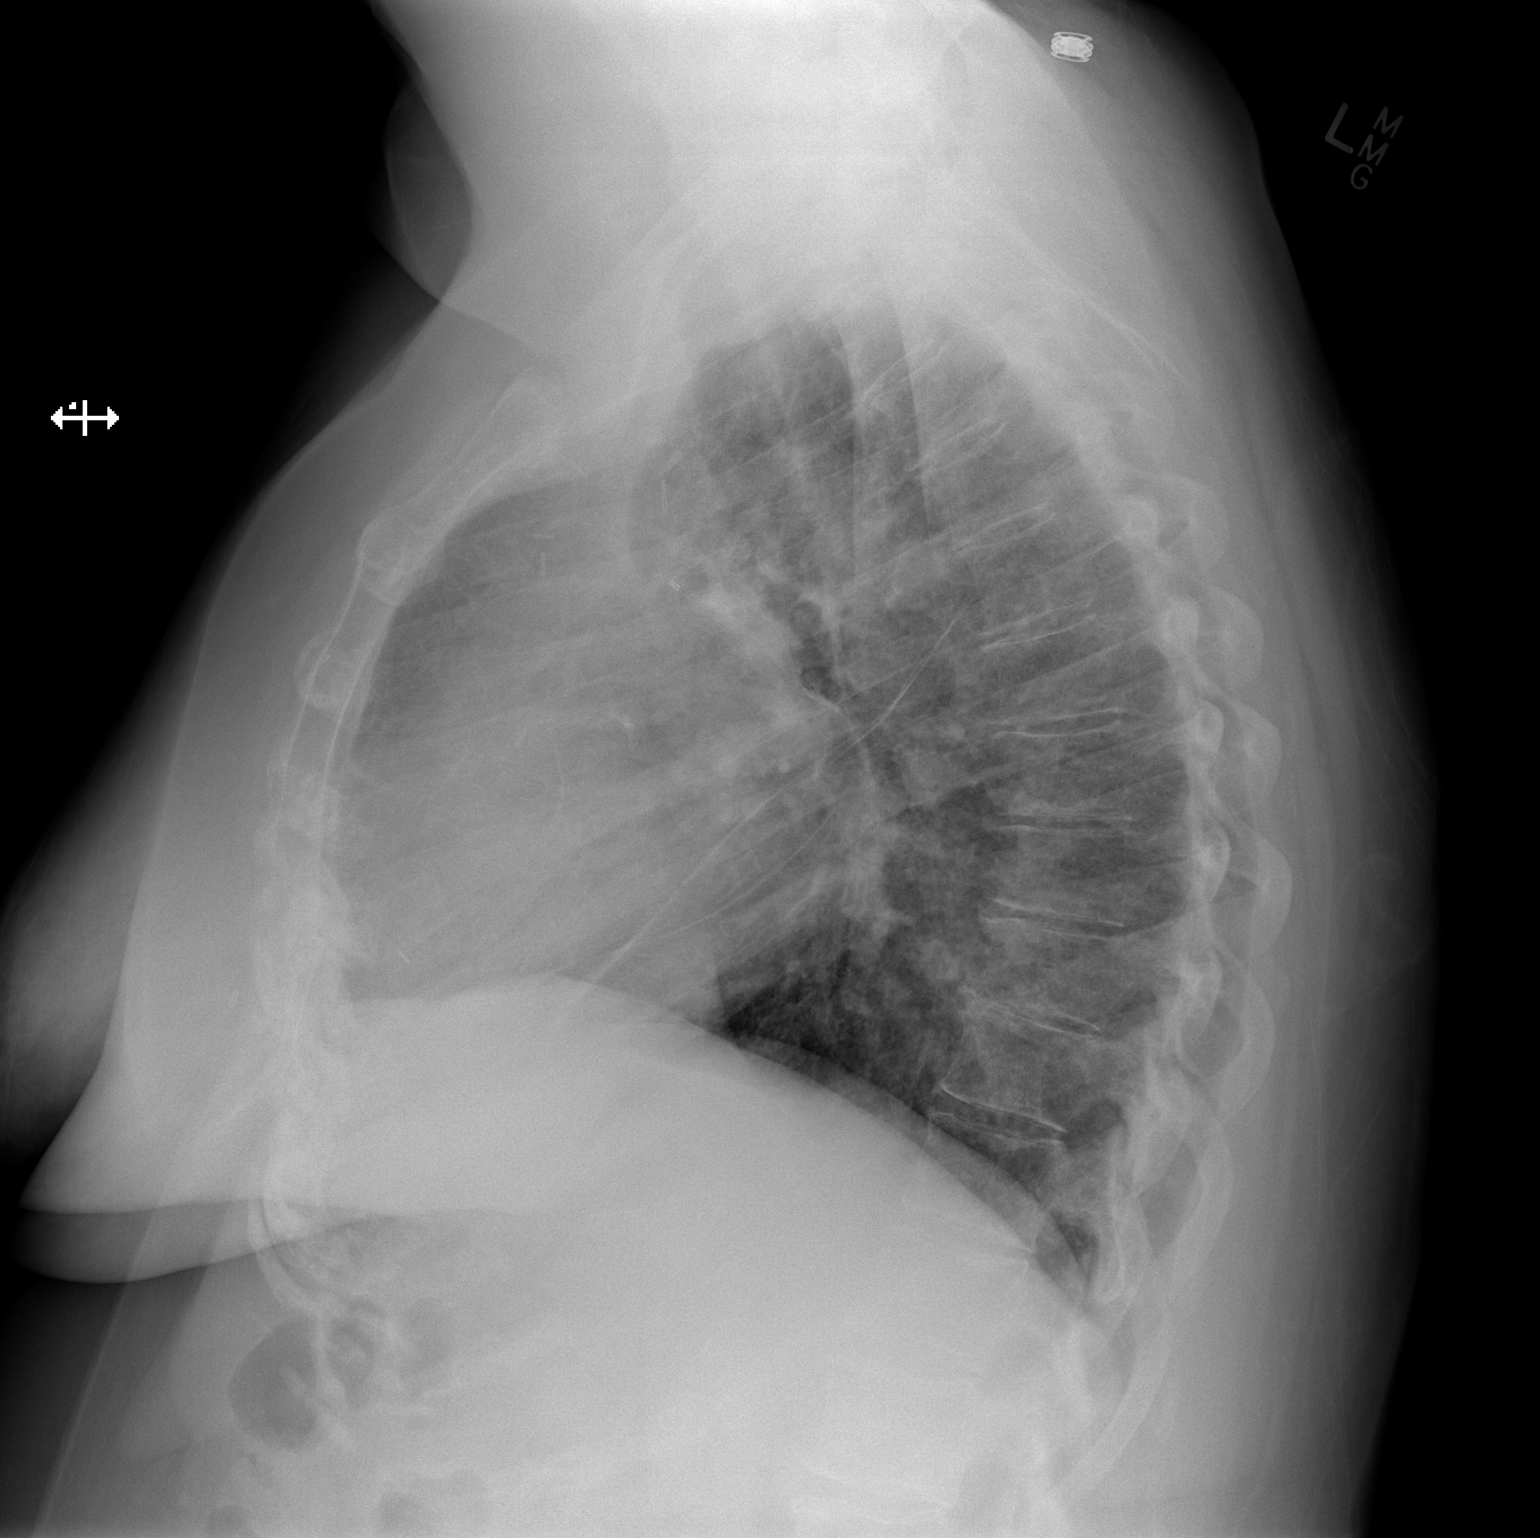

[2 of 2 positions shown; findings below may reference images not displayed]

FINDINGS: Mild cardiomegaly is stable as compared to prior study. Mediastinal
silhouette within normal limits. Atherosclerotic calcifications
present within the aortic arch. Suture material with surgical clips
and scarring present within the left suprahilar region.

Lungs are normally inflated. Known right upper lobe pulmonary nodule
is grossly stable. No focal infiltrate, pulmonary edema, or pleural
effusion. No pneumothorax.

Multilevel degenerative changes noted within the visualized spine.
No acute osseous abnormality.
IMPRESSION: 1. No active cardiopulmonary disease.
2. Ill defined right upper lobe nodular opacity, consistent with
known nodule at this location. Finding better evaluated on prior
PET-CT from 06/11/2014.

## 2016-01-31 IMAGING — CR DG CHEST 1V PORT
1 series · 1 of 1 positions shown · non-contrast
Comparison: 07/15/2014

CLINICAL DATA: Status post VATS

EXAM:
PORTABLE CHEST - 1 VIEW

[AP]
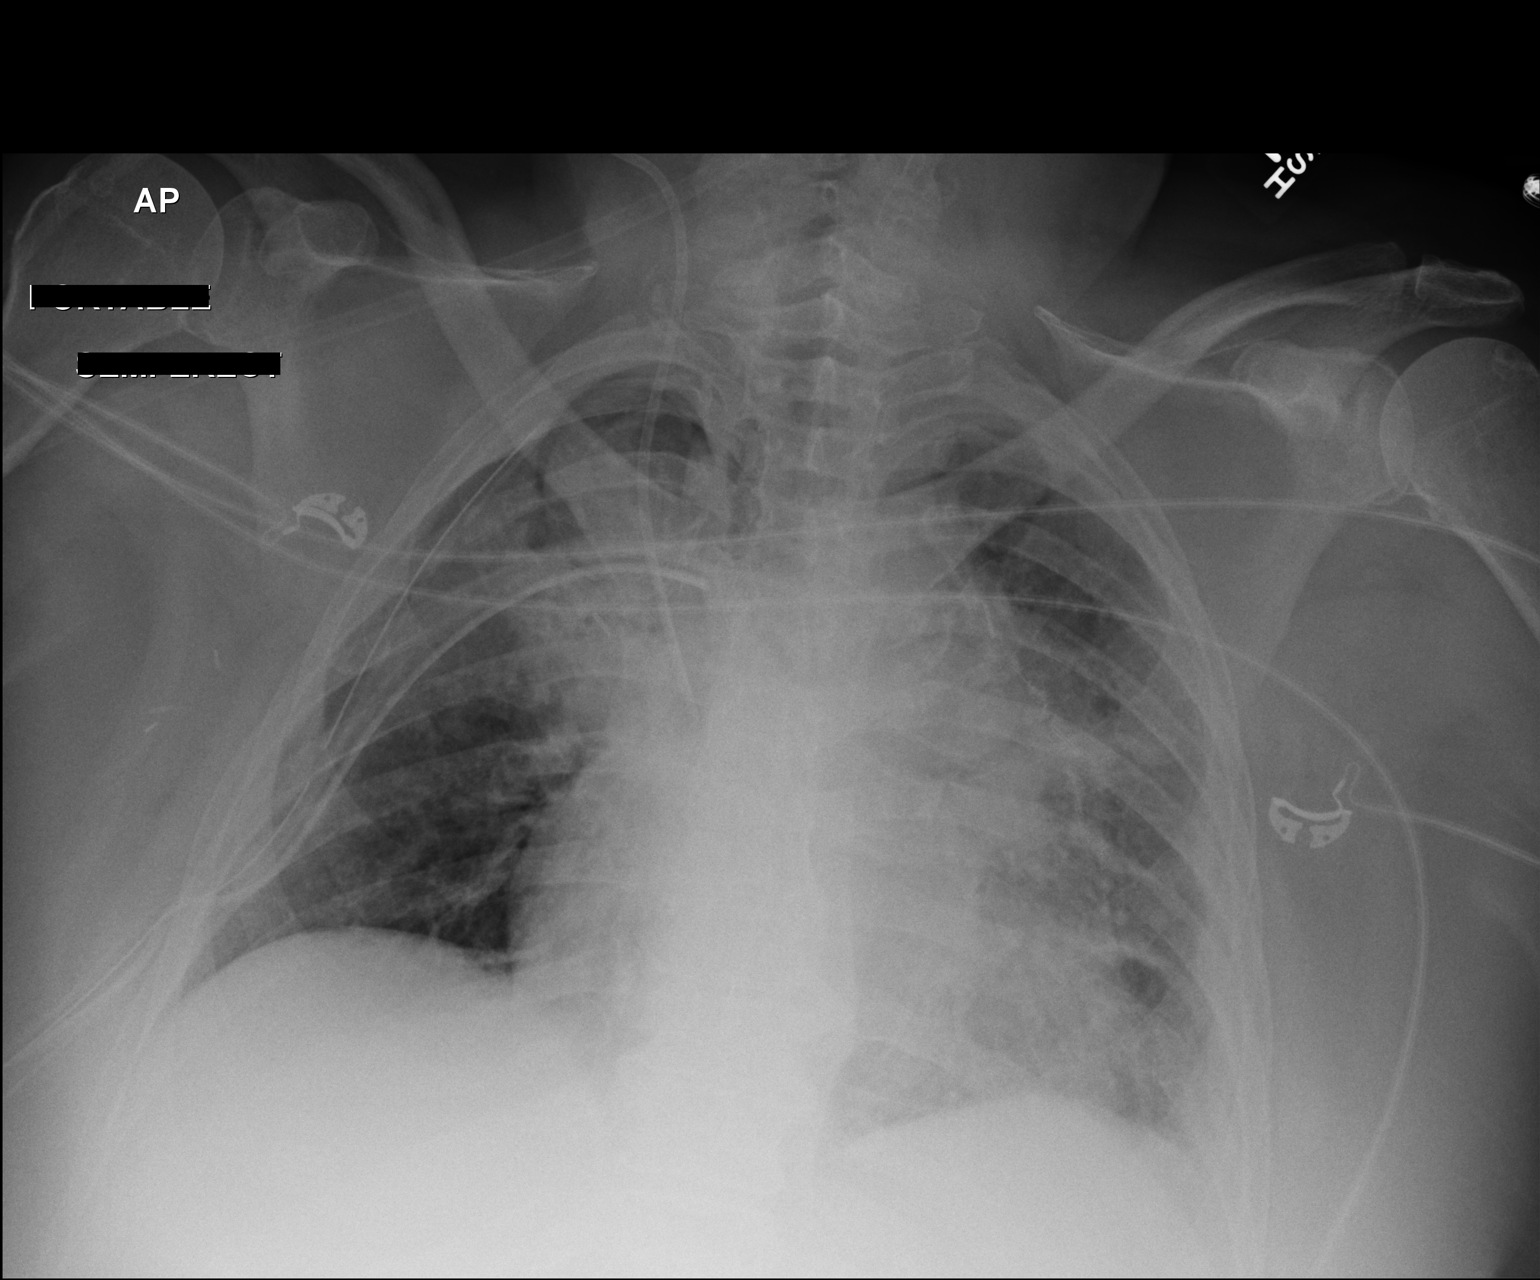

[1 of 1 positions shown; findings below may reference images not displayed]

FINDINGS: Status post VATS procedure. Two right-sided chest tubes directed
towards a upper chest. Right perihilar airspace opacity likely
representing postsurgical changes. Trace left pleural effusion. No
pneumothorax. Stable cardiac silhouette. Right-sided jugular central
venous catheter with the tip projecting over the SVC. Unremarkable
osseous structures.
IMPRESSION: 1. 2 right-sided chest tubes directed towards the upper chest
without a pneumothorax. Right perihilar airspace opacity likely
reflecting postsurgical changes.

## 2016-02-02 IMAGING — CR DG CHEST 1V PORT
1 series · 1 of 1 positions shown · non-contrast
Comparison: Portable chest x-ray of 07/16/2014

CLINICAL DATA: Posterior segmentectomy of the right upper lobe with
a wedge resection of the right lower lobe, followup

EXAM:
PORTABLE CHEST - 1 VIEW

[AP]
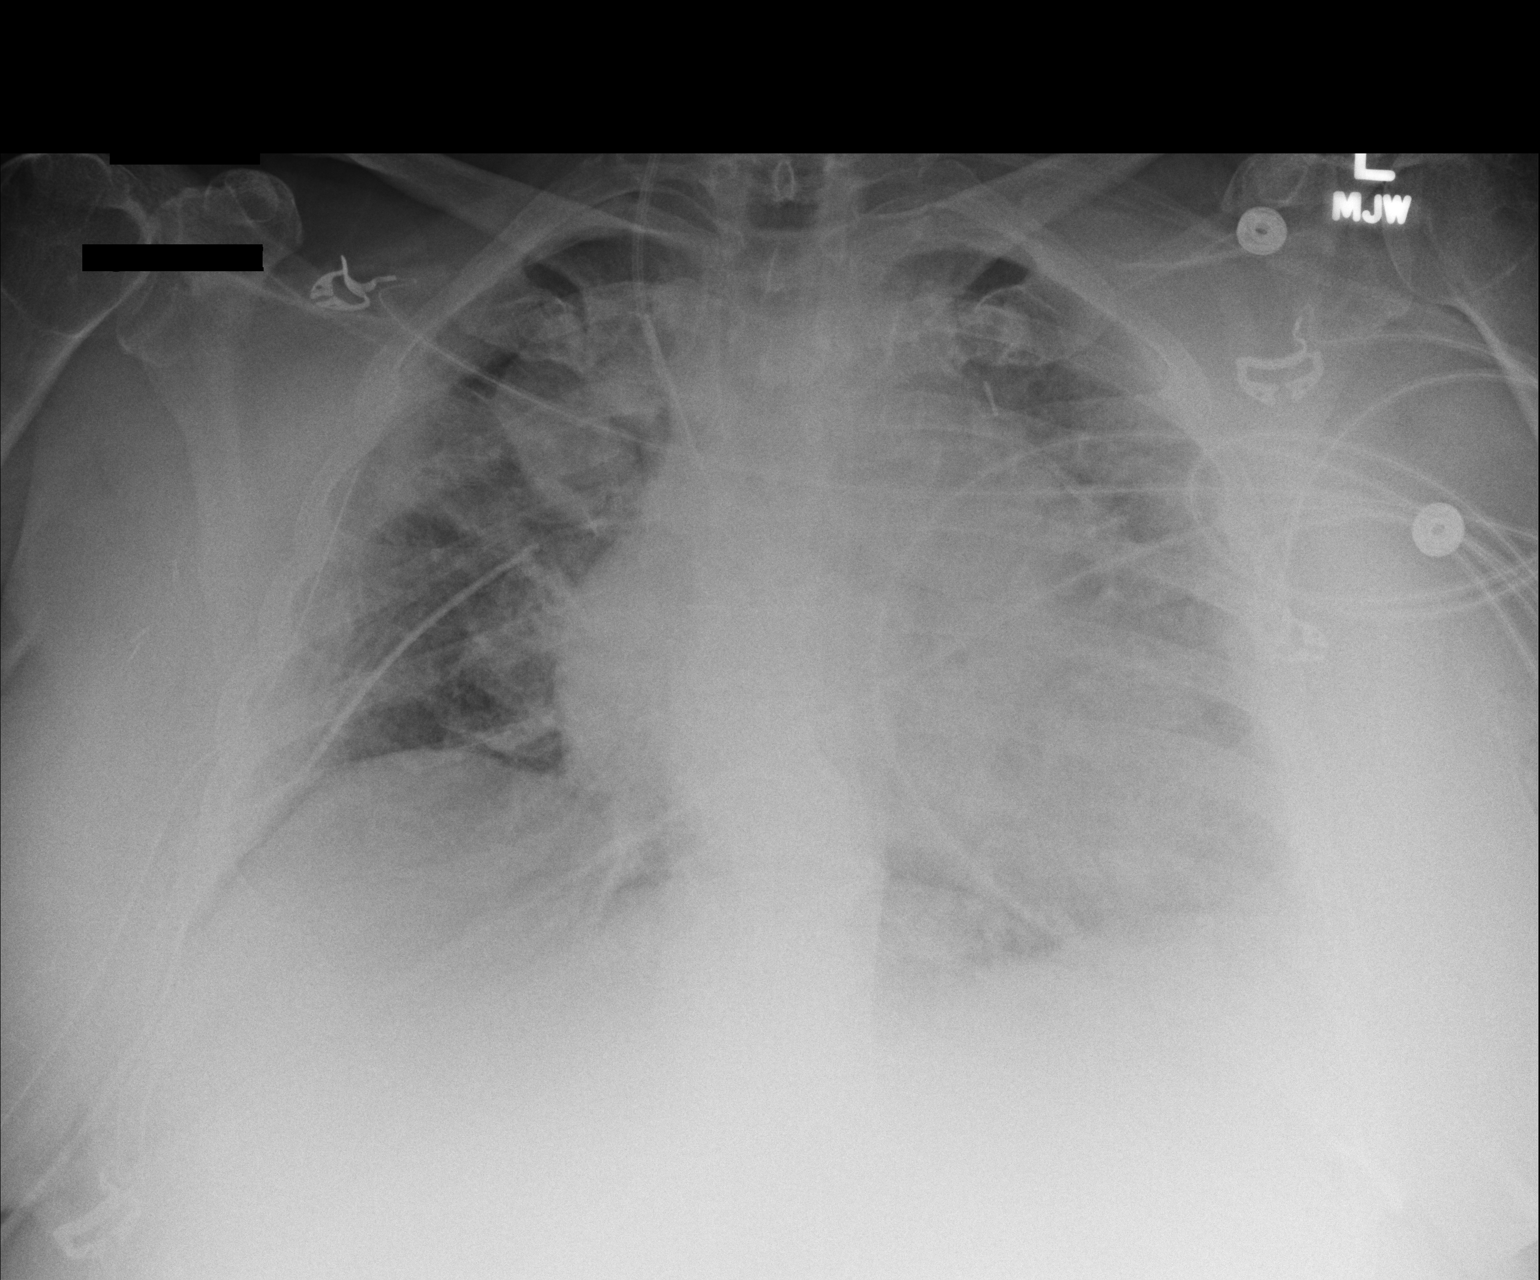

[1 of 1 positions shown; findings below may reference images not displayed]

FINDINGS: Aeration of the lungs has improved with some improvement in airspace
disease. Right chest tube remains and there is suspicion of a small
right pneumothorax along the apex remaining. Right IJ central venous
line is in unchanged in position and cardiomegaly is stable.
IMPRESSION: 1. Right chest tube remains with probable small right apical
pneumothorax.
2. Stable cardiomegaly with some improvement in airspace disease.

## 2016-02-03 IMAGING — CR DG CHEST 1V PORT
1 series · 1 of 1 positions shown · non-contrast
Comparison: July 17, 2014.

CLINICAL DATA: Status post segmentectomy.

EXAM:
PORTABLE CHEST - 1 VIEW

[AP]
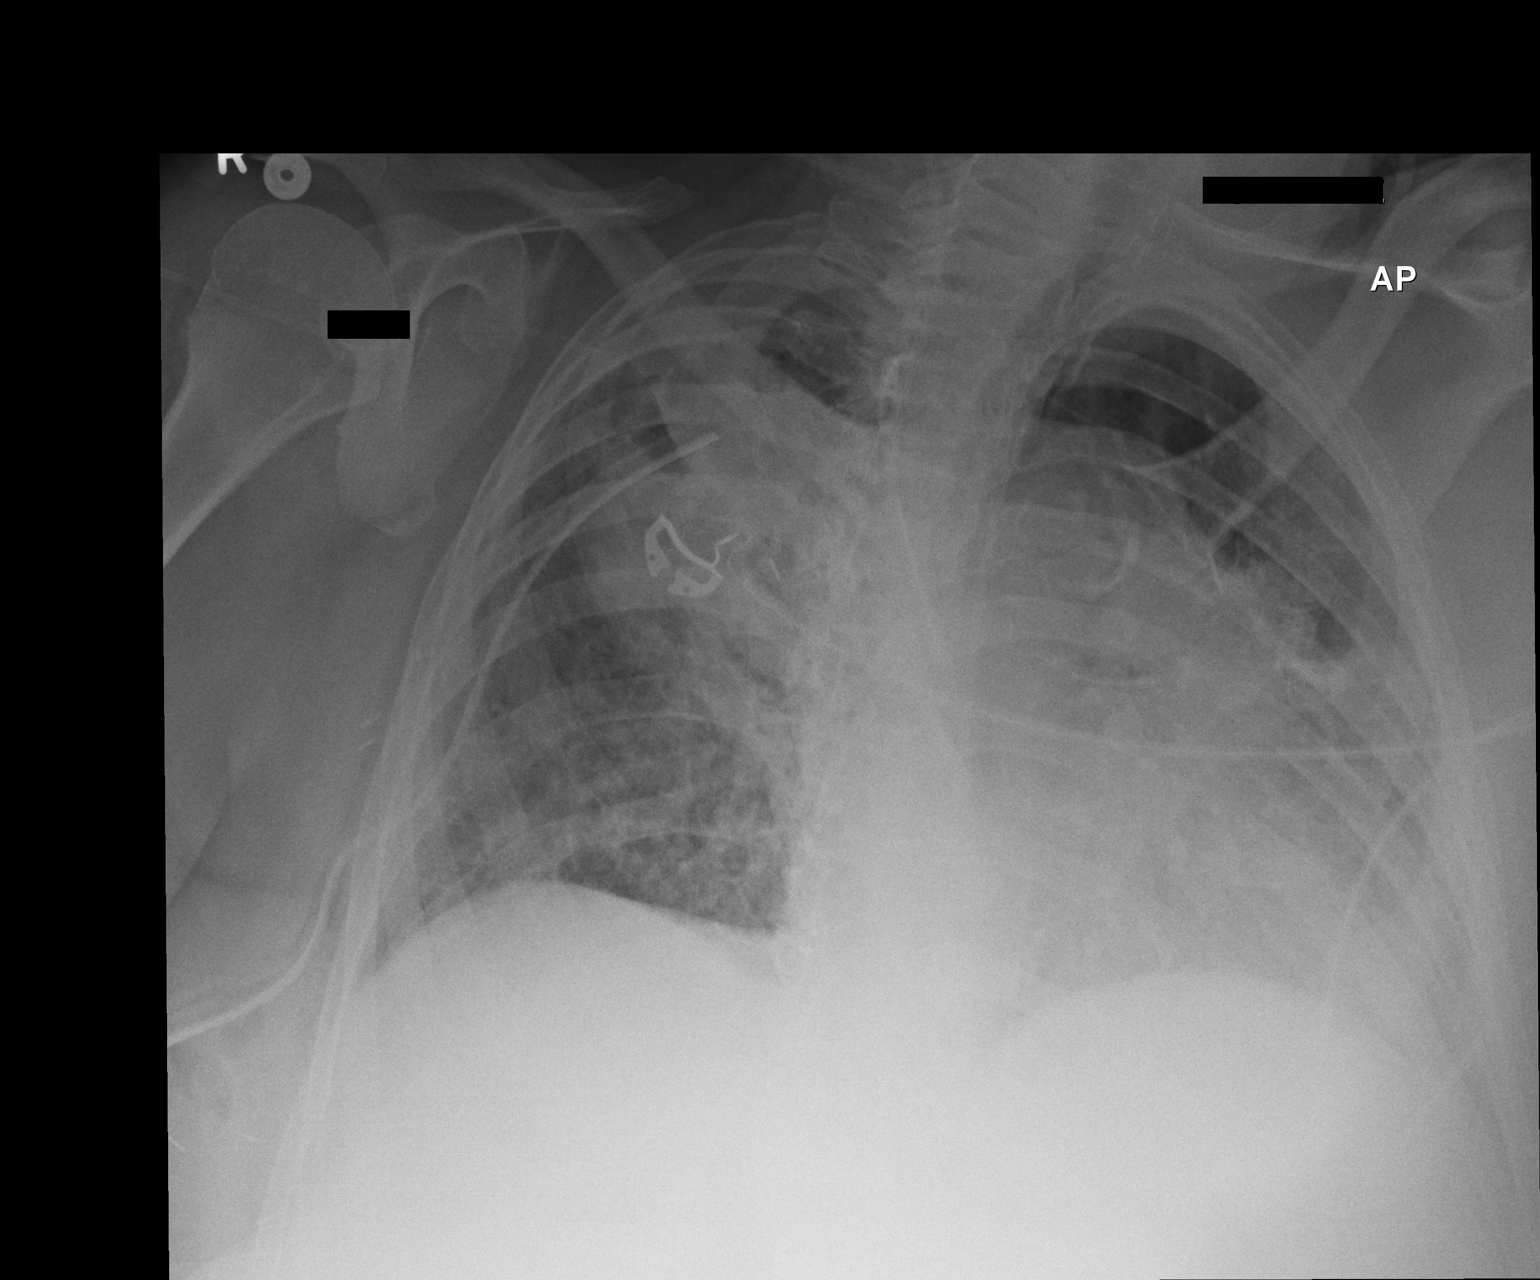

[1 of 1 positions shown; findings below may reference images not displayed]

FINDINGS: Stable cardiomegaly. One right-sided chest tube has been removed.
Remaining right-sided chest tube is noted with tip in a more
superior position than noted previously. Stable small right apical
pneumothorax is noted. No significant pleural effusion is noted.
Increased right suprahilar opacity is noted concerning for worsening
atelectasis or pneumonia. Right internal jugular catheter line noted
on prior exam remains with distal tip in expected position of the
SVC. Patient position is rotated to the left.
IMPRESSION: One chest tube has been removed. Stable small right apical
pneumothorax. Increased right suprahilar opacity is noted concerning
for worsening atelectasis or pneumonia.

## 2016-02-03 IMAGING — CR DG CHEST 1V SAME DAY
1 series · 1 of 1 positions shown · non-contrast
Comparison: 07/18/2014

CLINICAL DATA: Status post chest tube removal

EXAM:
CHEST - 1 VIEW SAME DAY

[AP]
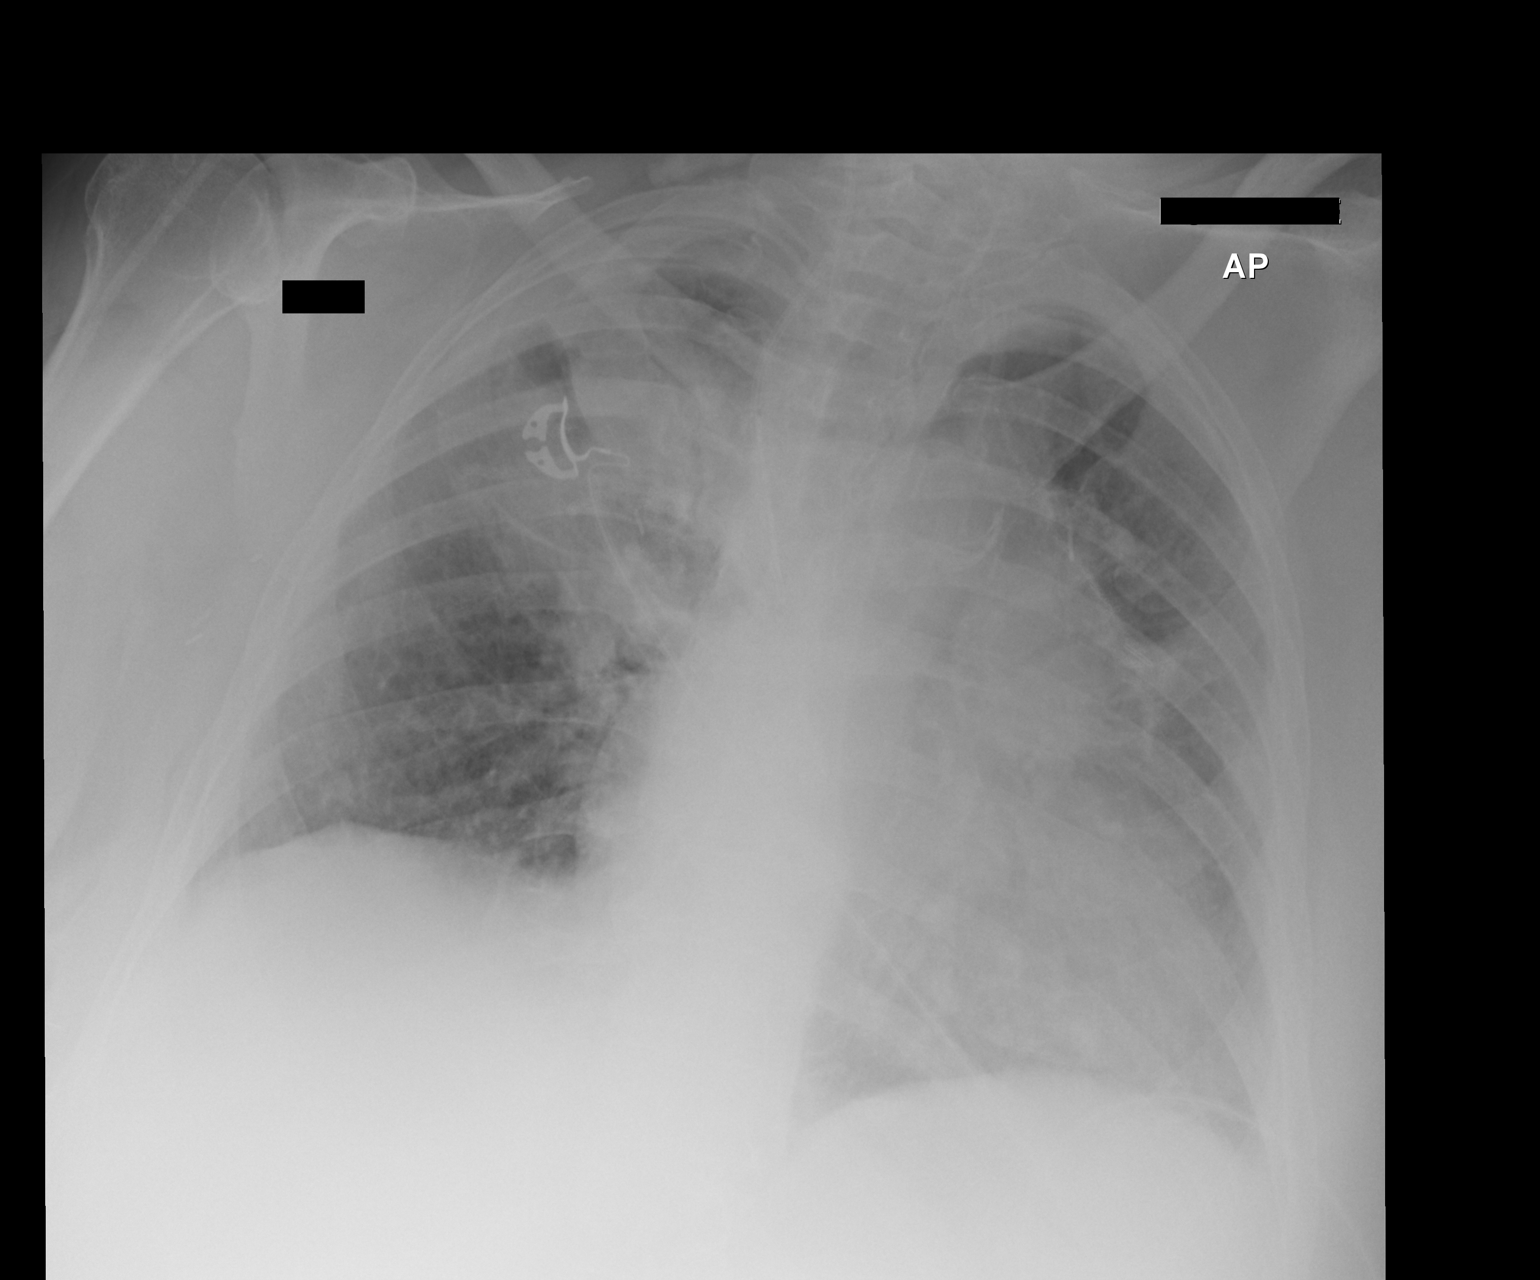

[1 of 1 positions shown; findings below may reference images not displayed]

FINDINGS: Interval removal of the right-sided chest tube. Small right apical
pneumothorax which is decreased in size compared with the prior
exam. Persistent right upper lobe airspace opacity. There are left
upper lobe postsurgical changes. There is no definite pleural
effusion or pneumothorax. Stable cardiomegaly. Thoracic aortic
atherosclerosis is present. Right-sided jugular central venous
catheter in unchanged position.
IMPRESSION: Interval removal of the right-sided chest tube with a small right
apical pneumothorax which is decreased in size compared with the
prior exam. Persistent right upper lobe airspace opacity.

## 2016-02-04 IMAGING — CR DG CHEST 2V
1 series · 1 of 1 positions shown · non-contrast
Comparison: 07/18/2014 and earlier.

CLINICAL DATA: 73-year-old female status post partial pneumonectomy
in both lungs, recently on the right. Initial encounter.

EXAM:
CHEST  2 VIEW

[w chest pa]
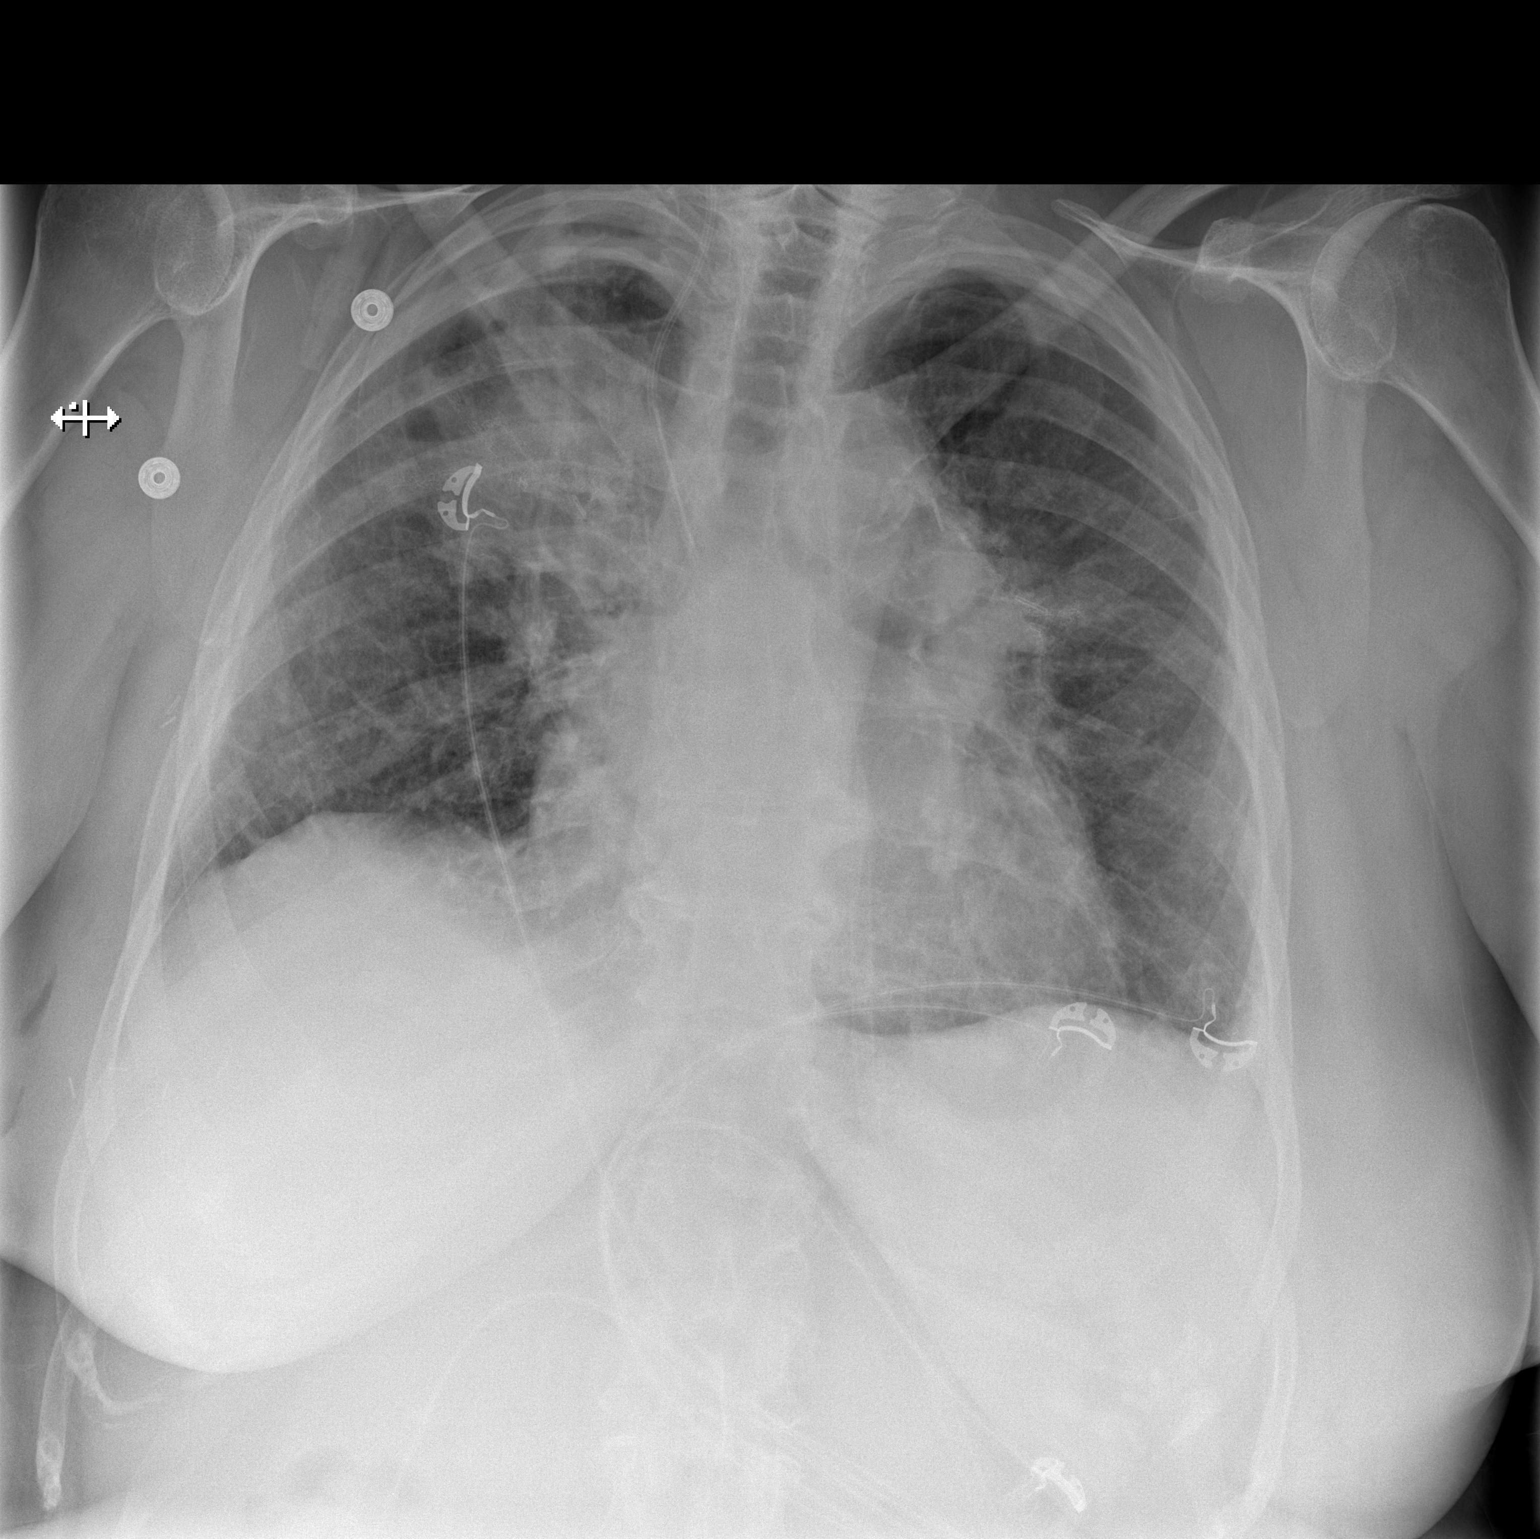

[1 of 1 positions shown; findings below may reference images not displayed]

FINDINGS: Postoperative changes about both hila. Right IJ central line remains
in place. No pneumothorax. Mildly decreased right suprahilar patchy
opacity. Stable cardiac size and mediastinal contours. No pleural
effusion or pulmonary edema. No areas of worsening ventilation.
IMPRESSION: Recent postoperative changes to the right lung with mildly regressed
postoperative opacity. Stable postoperative appearance of the left
lung. No new cardiopulmonary abnormality.

## 2016-02-05 IMAGING — CR DG CHEST 2V
2 series · 2 of 2 positions shown · non-contrast
Comparison: the previous day's study

CLINICAL DATA: Lung Ca, sp RLL wedge

EXAM:
CHEST - 2 VIEW

[w chest pa]
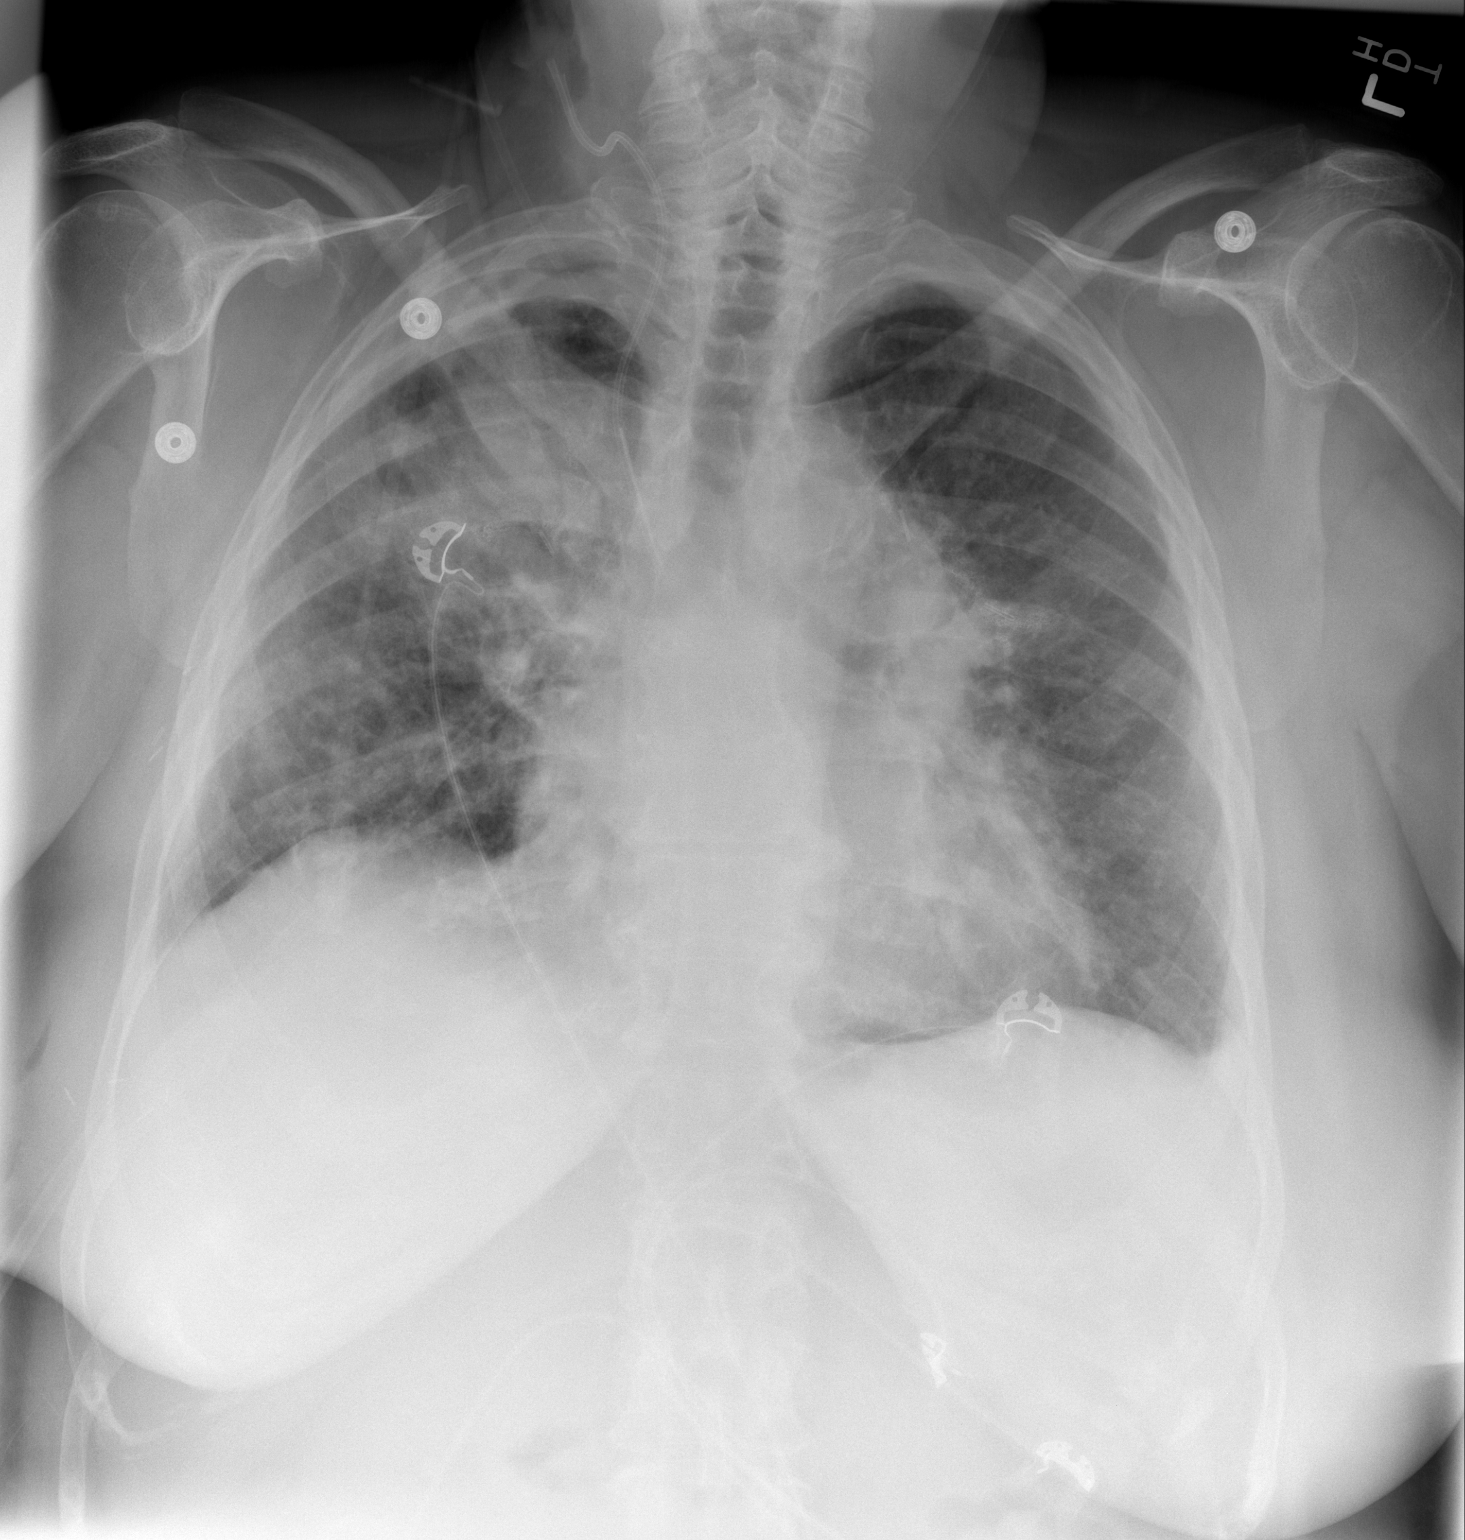

[w chest lat]
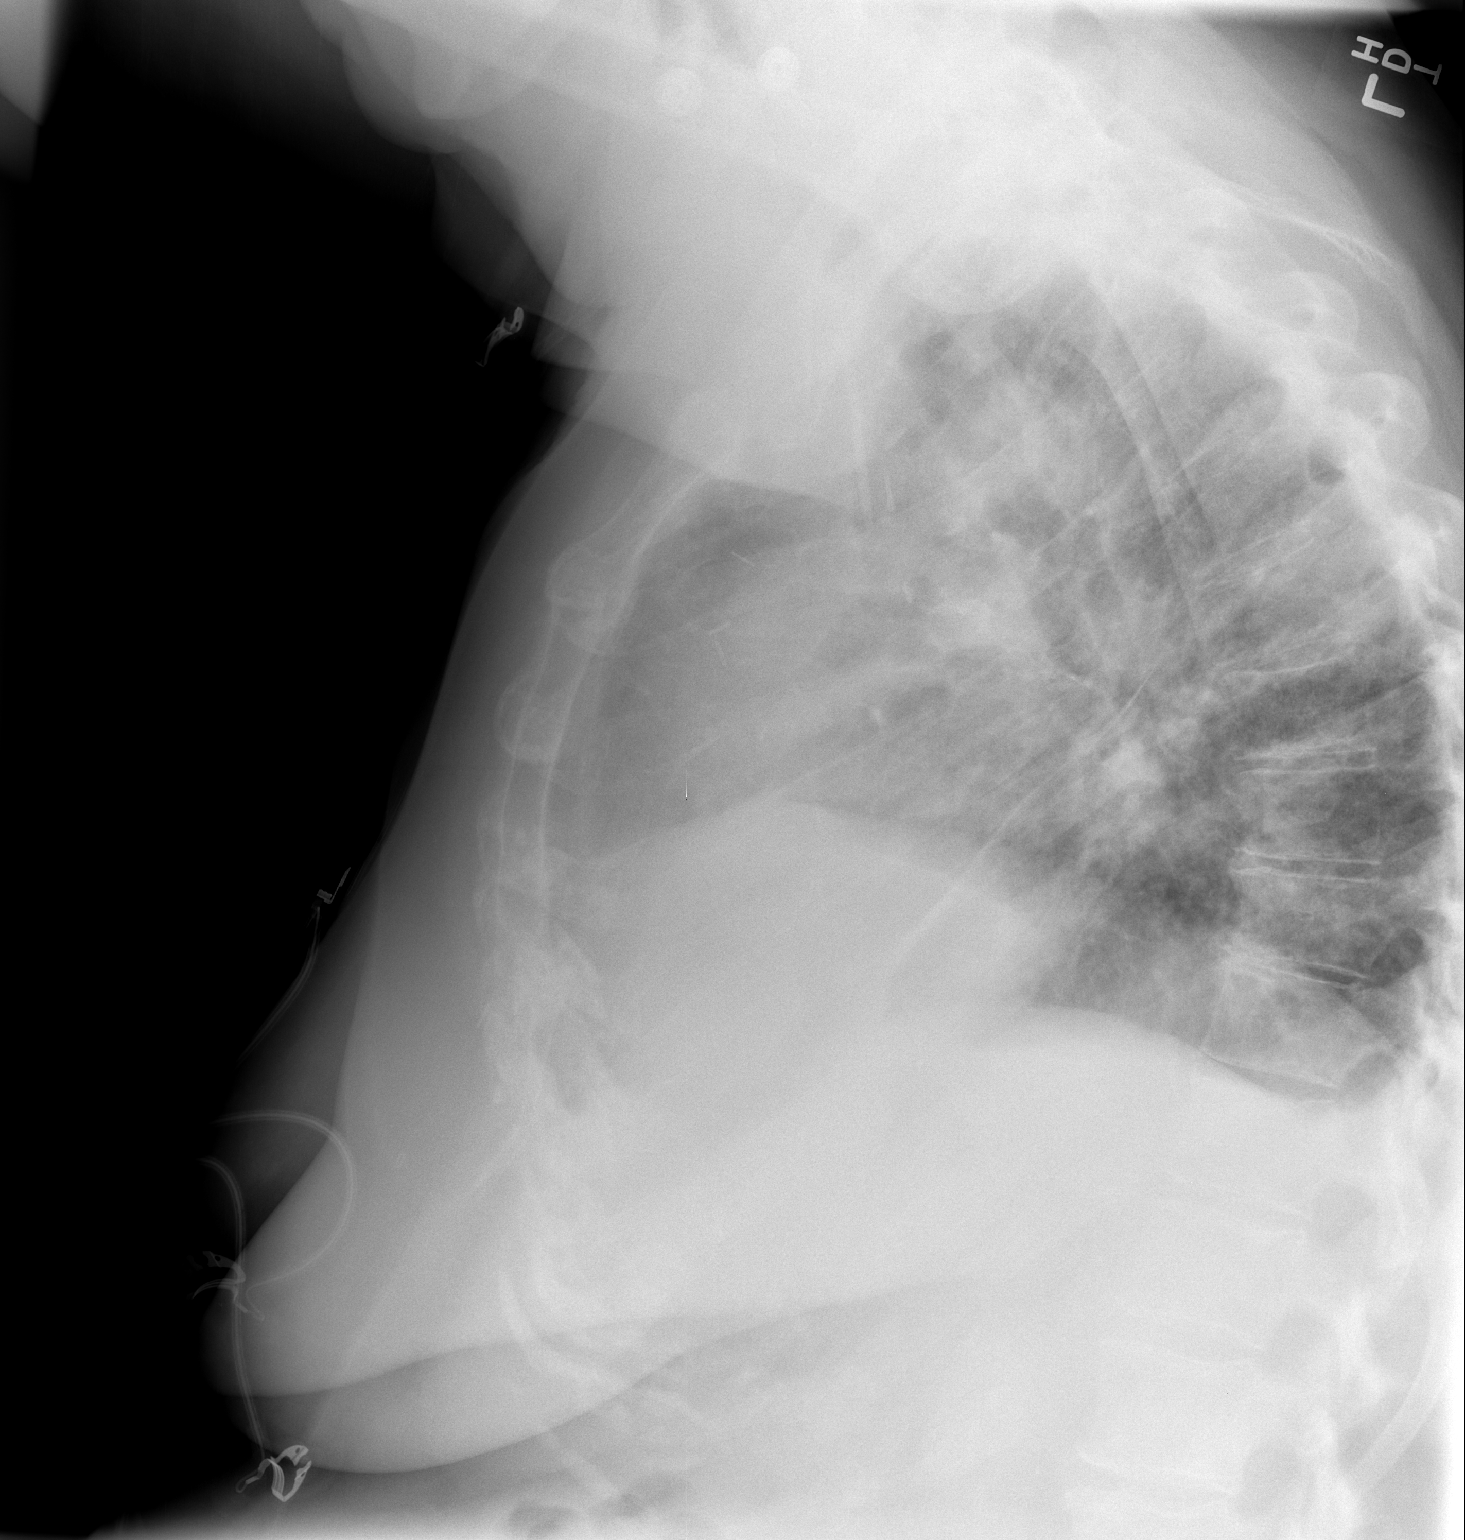

[2 of 2 positions shown; findings below may reference images not displayed]

FINDINGS: Right IJ central line stable. Staple lines in bilateral suprahilar
regions. Right upper lung airspace consolidation stable. Bibasilar
interstitial edema or infiltrates slightly increased. Small left
pleural effusion. Atheromatous aorta. Spurring in the lower thoracic
spine.
IMPRESSION: 1. Some increase in bibasilar interstitial edema or infiltrates with
small left effusion.

## 2016-02-06 ENCOUNTER — Encounter: Payer: Self-pay | Admitting: Pediatrics

## 2016-02-06 ENCOUNTER — Encounter: Payer: Self-pay | Admitting: Pharmacist

## 2016-02-06 ENCOUNTER — Ambulatory Visit (INDEPENDENT_AMBULATORY_CARE_PROVIDER_SITE_OTHER): Payer: PPO | Admitting: Pediatrics

## 2016-02-06 VITALS — BP 120/63 | HR 72 | Temp 96.9°F | Ht 71.0 in | Wt 282.6 lb

## 2016-02-06 DIAGNOSIS — I4891 Unspecified atrial fibrillation: Secondary | ICD-10-CM

## 2016-02-06 DIAGNOSIS — J449 Chronic obstructive pulmonary disease, unspecified: Secondary | ICD-10-CM | POA: Diagnosis not present

## 2016-02-06 DIAGNOSIS — R21 Rash and other nonspecific skin eruption: Secondary | ICD-10-CM | POA: Diagnosis not present

## 2016-02-06 DIAGNOSIS — Z5181 Encounter for therapeutic drug level monitoring: Secondary | ICD-10-CM | POA: Diagnosis not present

## 2016-02-06 DIAGNOSIS — IMO0001 Reserved for inherently not codable concepts without codable children: Secondary | ICD-10-CM

## 2016-02-06 LAB — POCT INR: INR: 2.5

## 2016-02-06 NOTE — Progress Notes (Signed)
Subjective:    Patient ID: Tammie [name], female    DOB: 06-05-1941, 75 y.o.   MRN: [id number]  CC: Follow-up   HPI: Tammie [name] is a 75 y.o. female presenting for Follow-up  Mechanical fall going out the door at home 6 months ago, scraped leg on door frame. Still with red rash on ant shin. Used a steroid cream on it per dermatology recommendations.  Atrial fibrillation: on warfarin, no palpitations  Lung cancer: followed by Dr. Inda Merlin, next appt in 1 month. Breathing has been at baseline  CKD: seeing nephrology once a year  Mood has been ok, increased stress at home, not able to do as much to keep house clean as before.  Depression screen [facility] 2/9 02/06/2016 08/06/2015 06/11/2015 03/10/2015 03/27/2014  Decreased Interest 0 0 0 0 0  Down, Depressed, Hopeless 0 0 0 0 0  PHQ - 2 Score 0 0 0 0 0     Relevant past medical, surgical, family and social history reviewed and updated as indicated. Interim medical history since our last visit reviewed. Allergies and medications reviewed and updated.    ROS: Per HPI unless specifically indicated above  History  Smoking status  . Former Smoker -- 3.00 packs/day for 32 years  . Types: Cigarettes  . Start date: 12/27/1989  . Quit date: 03/08/1990  Smokeless tobacco  . Former Systems developer  . Quit date: 03/08/1990    Comment: smoked 3ppd from 1959-1991     Past Medical History Patient Active Problem List   Diagnosis Date Noted  . Antineoplastic chemotherapy induced anemia 03/24/2015  . Renal insufficiency 03/24/2015  . Hyperlipidemia with target LDL less than 100 03/10/2015  . Peripheral edema 03/10/2015  . Shingles 10/17/2014  . 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 ([place]) 08/29/2014  . Breast cancer of upper-outer quadrant of right female breast ([place]) 11/13/2013  . 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 HTN (hypertension) 03/13/2012  . Atrial fibrillation with RVR ([place]) 03/08/2012  . Asthma 04/17/2011  . PULMONARY NODULE  12/10/2010  . DYSPNEA ON EXERTION 09/04/2010  . CVA 05/21/2009    Current Outpatient Prescriptions  Medication Sig Dispense Refill  . acetaminophen (TYLENOL) 650 MG CR tablet Take 650-1,300 mg by mouth every 8 (eight) hours as needed for pain.     Marland Kitchen albuterol (PROVENTIL HFA;VENTOLIN HFA) 108 (90 Base) MCG/ACT inhaler Inhale 2 puffs into the lungs every 6 (six) hours as needed for wheezing or shortness of breath. 1 Inhaler 1  . calcium carbonate (TUMS - DOSED IN MG ELEMENTAL CALCIUM) 500 MG chewable tablet Chew 1 tablet by mouth as needed for indigestion or heartburn.    . Fish Oil-Cholecalciferol (FISH OIL + D3 PO) Take 1 capsule by mouth daily.    . Flaxseed MISC Take 1 capsule by mouth daily.     . fluticasone (CUTIVATE) 0.05 % cream Apply topically 2 (two) times daily. 30 g 0  . furosemide (LASIX) 40 MG tablet Take 1 tablet (40 mg total) by mouth 2 (two) times daily. TAKE ONE TABLET BY MOUTH ONCE DAILY IN THE MORNING and AFTERNOON 180 tablet 3  . Garlic 5188 MG CAPS Take 1,000 mg by mouth 2 (two) times daily.     Marland Kitchen loratadine (ALLERGY RELIEF) 10 MG tablet Take 10 mg by mouth daily as needed for allergies.    . Melatonin CR 3 MG TBCR Take 2 tablets by mouth at bedtime as needed.    . metoprolol (LOPRESSOR) 100 MG tablet  Take 1.5 tablets (150 mg total) by mouth 2 (two) times daily. 270 tablet 1  . mupirocin ointment (BACTROBAN) 2 % Apply to AA BID x 14 days 30 g 1  . simvastatin (ZOCOR) 10 MG tablet Take 1 tablet (10 mg total) by mouth daily. (Patient taking differently: Take 10 mg by mouth every evening. ) 90 tablet 1  . warfarin (COUMADIN) 5 MG tablet Take 1 tablet (5 mg total) by mouth daily. (Patient taking differently: Take 2.5-5 mg by mouth daily. 2.5 mg everyday except Monday take '5mg'$ ) 90 tablet 0  . [DISCONTINUED] Calcium Citrate-Vitamin D (CALCIUM CITRATE + D) 300-100 MG-UNIT TABS Take 0.5 tablets by mouth 2 (two) times daily.      . [DISCONTINUED] metoprolol (TOPROL-XL) 100 MG 24 hr  tablet Take 100 mg by mouth daily.       No current facility-administered medications for this visit.       Objective:    BP 120/63 mmHg  Pulse 72  Temp(Src) 96.9 F (36.1 C) (Oral)  Ht '5\' 11"'$  (1.803 m)  Wt 282 lb 9.6 oz (128.187 kg)  BMI 39.43 kg/m2  SpO2 93%  Wt Readings from Last 3 Encounters:  02/06/16 282 lb 9.6 oz (128.187 kg)  12/09/15 278 lb (126.1 kg)  09/10/15 279 lb (126.554 kg)    Gen: NAD, alert, cooperative with exam, NCAT EYES: EOMI, no scleral injection or icterus ENT:   OP without erythema LYMPH: no cervical LAD CV: NRRR, normal S1/S2, no murmur, distal pulses 2+ b/l Resp: CTABL, moving air well, no wheezes, normal WOB Abd: +BS, soft, NTND.  Ext: No edema, warm Neuro: Alert and oriented MSK: normal muscle bulk Skin: ant L shin with apprx 10cm by 10 cm area of venous stasis changes, some dry skin, no warmth, redness, tenderness or swelling     Assessment & Plan:    Tammie [name] was seen today for follow-up multiple med problems.  Diagnoses and all orders for this visit:  Encounter for therapeutic drug monitoring INR 2.5 today, cont current dosing warfarin. -     POCT INR  Rash Looks like venous stasis changes, leg had a prolonged healing process from injury months ago, use moisturizer daily. Does not appear infected. No recent changes.  Atrial fibrillation  ([place]) On warfarin as above. No symptoms, palpitations.  COPD bronchitis Breathing doing ok, does get SOB with exertion. Has had lung ca surgery in the past. Getting treatment for lung cancer by [name] in [place].    Follow up plan: Return in about 3 weeks (around 02/27/2016). INR check  [name], MD [facility] 02/06/2016, 11:46 AM

## 2016-02-06 NOTE — Patient Instructions (Signed)
Eucerin cream for moisturizing, get the kind in a jar

## 2016-02-16 ENCOUNTER — Other Ambulatory Visit: Payer: Self-pay

## 2016-02-16 DIAGNOSIS — Z1231 Encounter for screening mammogram for malignant neoplasm of breast: Secondary | ICD-10-CM

## 2016-02-21 DIAGNOSIS — J449 Chronic obstructive pulmonary disease, unspecified: Secondary | ICD-10-CM | POA: Diagnosis not present

## 2016-02-27 ENCOUNTER — Ambulatory Visit (INDEPENDENT_AMBULATORY_CARE_PROVIDER_SITE_OTHER): Payer: PPO | Admitting: Pharmacist

## 2016-02-27 DIAGNOSIS — I4891 Unspecified atrial fibrillation: Secondary | ICD-10-CM | POA: Diagnosis not present

## 2016-02-27 LAB — POCT INR: INR: 3

## 2016-02-27 NOTE — Patient Instructions (Signed)
Anticoagulation Dose Instructions as of 02/27/2016      Sun Mon Tue Wed Thu Fri Sat   New Dose 2.5 mg 2.5 mg 2.5 mg 2.5 mg 2.5 mg 2.5 mg 2.5 mg    Description        No warfarin today - Friday, March 3rd.  Then restart usually dose for warfarin '5mg'$  take 1/2 tablet daily.     INR was 3.0 today

## 2016-03-01 ENCOUNTER — Other Ambulatory Visit: Payer: Self-pay | Admitting: Internal Medicine

## 2016-03-01 ENCOUNTER — Ambulatory Visit
Admission: RE | Admit: 2016-03-01 | Discharge: 2016-03-01 | Disposition: A | Discharge status: Home / Self Care | Payer: PPO | Source: Ambulatory Visit

## 2016-03-01 ENCOUNTER — Other Ambulatory Visit: Payer: Self-pay

## 2016-03-01 DIAGNOSIS — Z9889 Other specified postprocedural states: Secondary | ICD-10-CM

## 2016-03-01 DIAGNOSIS — Z1231 Encounter for screening mammogram for malignant neoplasm of breast: Secondary | ICD-10-CM

## 2016-03-01 DIAGNOSIS — R922 Inconclusive mammogram: Secondary | ICD-10-CM | POA: Diagnosis not present

## 2016-03-09 ENCOUNTER — Other Ambulatory Visit: Payer: Self-pay | Admitting: Medical Oncology

## 2016-03-09 ENCOUNTER — Ambulatory Visit (HOSPITAL_COMMUNITY)
Admission: RE | Admit: 2016-03-09 | Discharge: 2016-03-09 | Disposition: A | Discharge status: Home / Self Care | Payer: PPO | Source: Ambulatory Visit | Attending: Internal Medicine | Admitting: Internal Medicine

## 2016-03-09 ENCOUNTER — Encounter (HOSPITAL_COMMUNITY): Payer: Self-pay

## 2016-03-09 ENCOUNTER — Other Ambulatory Visit (HOSPITAL_BASED_OUTPATIENT_CLINIC_OR_DEPARTMENT_OTHER): Payer: PPO

## 2016-03-09 DIAGNOSIS — I2510 Atherosclerotic heart disease of native coronary artery without angina pectoris: Secondary | ICD-10-CM | POA: Diagnosis not present

## 2016-03-09 DIAGNOSIS — C50911 Malignant neoplasm of unspecified site of right female breast: Secondary | ICD-10-CM | POA: Diagnosis not present

## 2016-03-09 DIAGNOSIS — N289 Disorder of kidney and ureter, unspecified: Secondary | ICD-10-CM

## 2016-03-09 DIAGNOSIS — C50411 Malignant neoplasm of upper-outer quadrant of right female breast: Secondary | ICD-10-CM | POA: Diagnosis not present

## 2016-03-09 DIAGNOSIS — C3431 Malignant neoplasm of lower lobe, right bronchus or lung: Secondary | ICD-10-CM

## 2016-03-09 DIAGNOSIS — R911 Solitary pulmonary nodule: Secondary | ICD-10-CM | POA: Insufficient documentation

## 2016-03-09 DIAGNOSIS — Z9889 Other specified postprocedural states: Secondary | ICD-10-CM | POA: Diagnosis not present

## 2016-03-09 DIAGNOSIS — I700 Atherosclerosis of aorta: Secondary | ICD-10-CM | POA: Diagnosis not present

## 2016-03-09 DIAGNOSIS — K8020 Calculus of gallbladder without cholecystitis without obstruction: Secondary | ICD-10-CM | POA: Diagnosis not present

## 2016-03-09 DIAGNOSIS — Z85118 Personal history of other malignant neoplasm of bronchus and lung: Secondary | ICD-10-CM | POA: Diagnosis not present

## 2016-03-09 LAB — COMPREHENSIVE METABOLIC PANEL
ALK PHOS: 58 U/L (ref 40–150)
ALT: 10 U/L (ref 0–55)
AST: 14 U/L (ref 5–34)
Albumin: 3.5 g/dL (ref 3.5–5.0)
Anion Gap: 10 mEq/L (ref 3–11)
BUN: 33.2 mg/dL — AB (ref 7.0–26.0)
CHLORIDE: 110 meq/L — AB (ref 98–109)
CO2: 26 meq/L (ref 22–29)
CREATININE: 1.9 mg/dL — AB (ref 0.6–1.1)
Calcium: 9.4 mg/dL (ref 8.4–10.4)
EGFR: 26 mL/min/{1.73_m2} — ABNORMAL LOW (ref >90)
GLUCOSE: 110 mg/dL (ref 70–140)
Potassium: 4.8 mEq/L (ref 3.5–5.1)
Sodium: 146 mEq/L — ABNORMAL HIGH (ref 136–145)
Total Bilirubin: <0.3 mg/dL (ref 0.20–1.20)
Total Protein: 6.9 g/dL (ref 6.4–8.3)

## 2016-03-09 LAB — CBC WITH DIFFERENTIAL/PLATELET
BASO%: 1.3 % (ref 0.0–2.0)
BASOS ABS: 0.1 10*3/uL (ref 0.0–0.1)
EOS%: 3.9 % (ref 0.0–7.0)
Eosinophils Absolute: 0.2 10*3/uL (ref 0.0–0.5)
HEMATOCRIT: 33 % — AB (ref 34.8–46.6)
HGB: 10.4 g/dL — ABNORMAL LOW (ref 11.6–15.9)
LYMPH%: 17.4 % (ref 14.0–49.7)
MCH: 26.3 pg (ref 25.1–34.0)
MCHC: 31.4 g/dL — AB (ref 31.5–36.0)
MCV: 83.8 fL (ref 79.5–101.0)
MONO#: 0.4 10*3/uL (ref 0.1–0.9)
MONO%: 7.2 % (ref 0.0–14.0)
NEUT#: 3.9 10*3/uL (ref 1.5–6.5)
NEUT%: 70.2 % (ref 38.4–76.8)
Platelets: 209 10*3/uL (ref 145–400)
RBC: 3.94 10*6/uL (ref 3.70–5.45)
RDW: 16 % — ABNORMAL HIGH (ref 11.2–14.5)
WBC: 5.5 10*3/uL (ref 3.9–10.3)
lymph#: 1 10*3/uL (ref 0.9–3.3)

## 2016-03-14 IMAGING — CR DG CHEST 2V
2 series · 2 of 2 positions shown · non-contrast
Comparison: Chest x-ray of 07/30/2014 and CT chest of 05/02/2014

CLINICAL DATA: History of lung carcinoma involving the right lung
as well as breast carcinoma, some shortness of breath, recent VATS

EXAM:
CHEST  2 VIEW

[w chest pa]
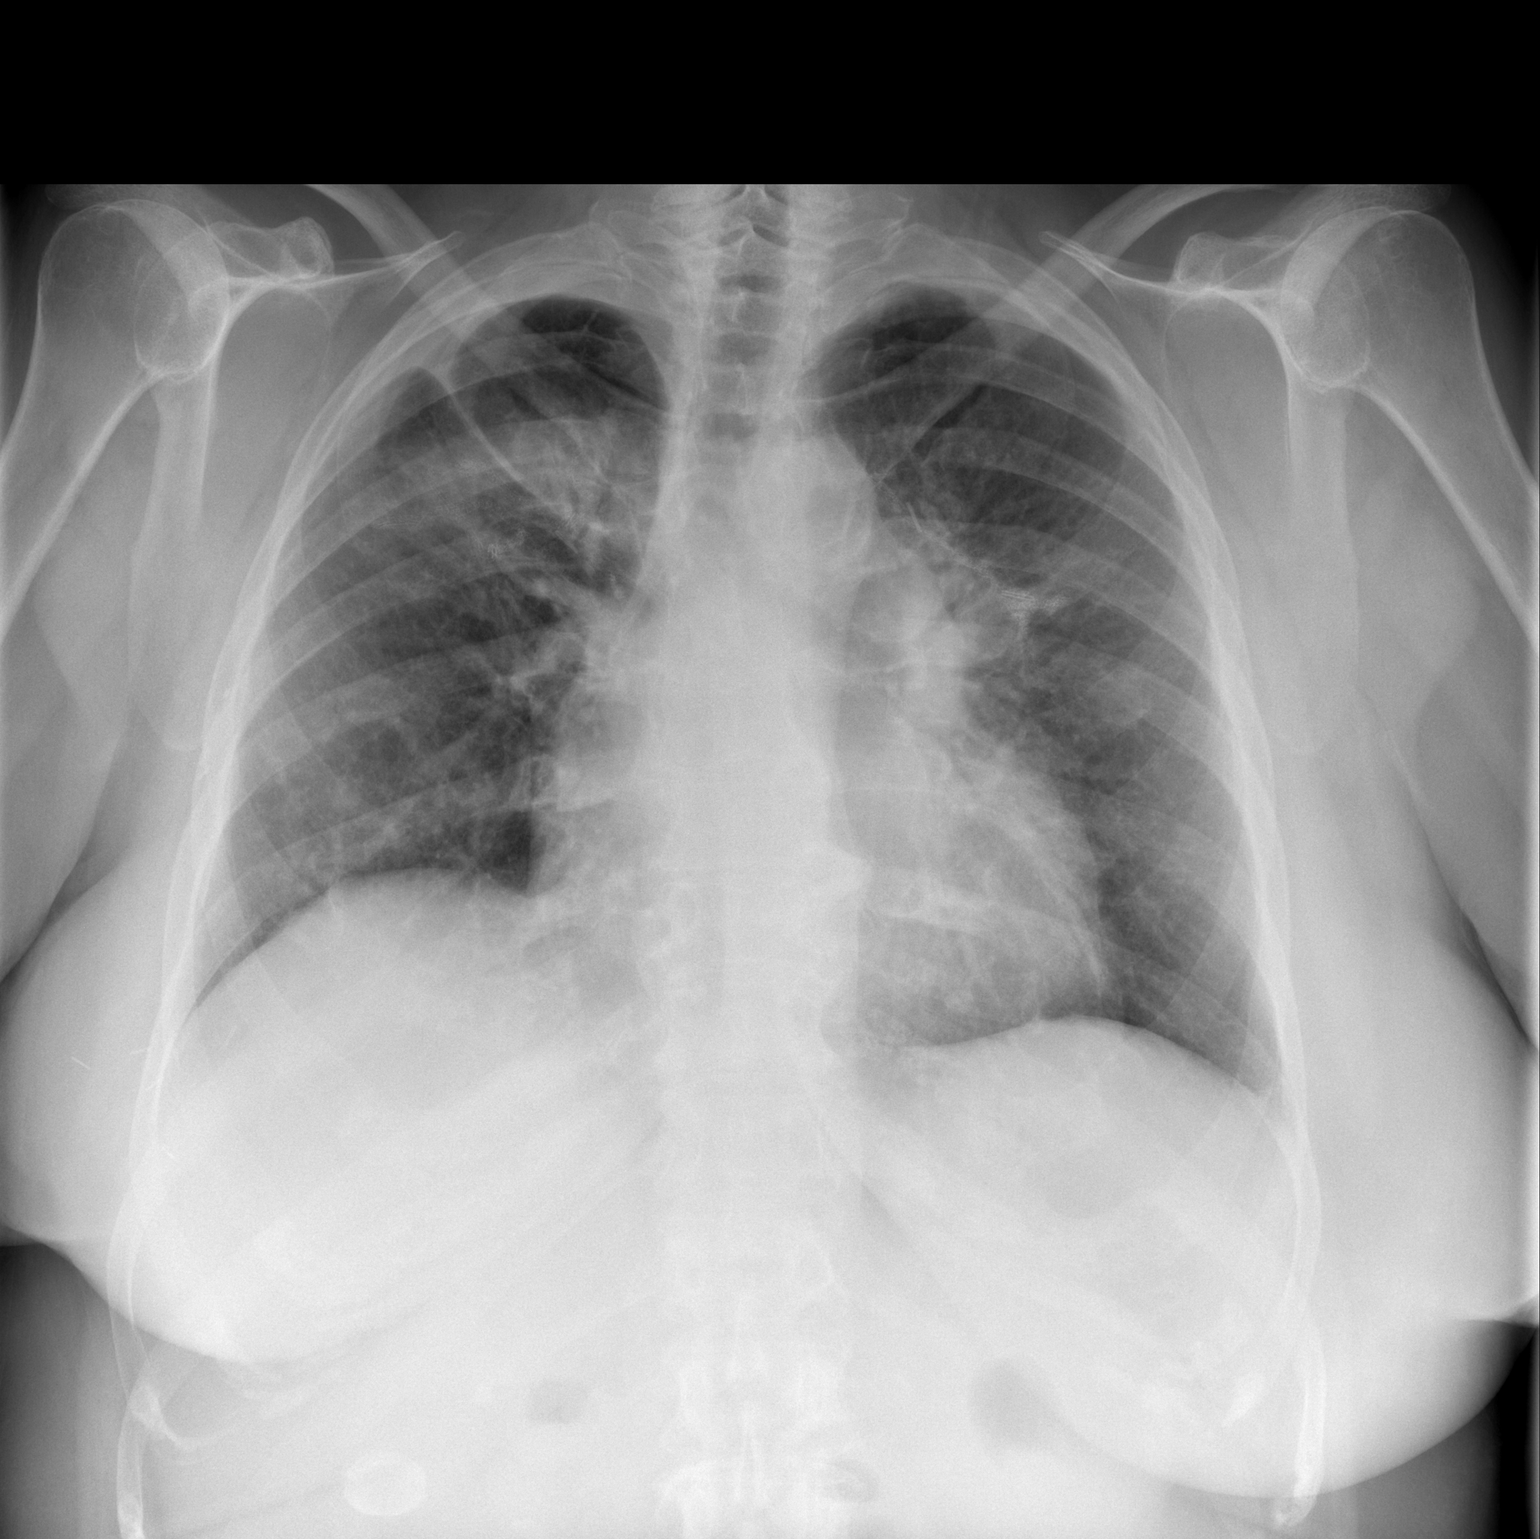

[w chest lat]
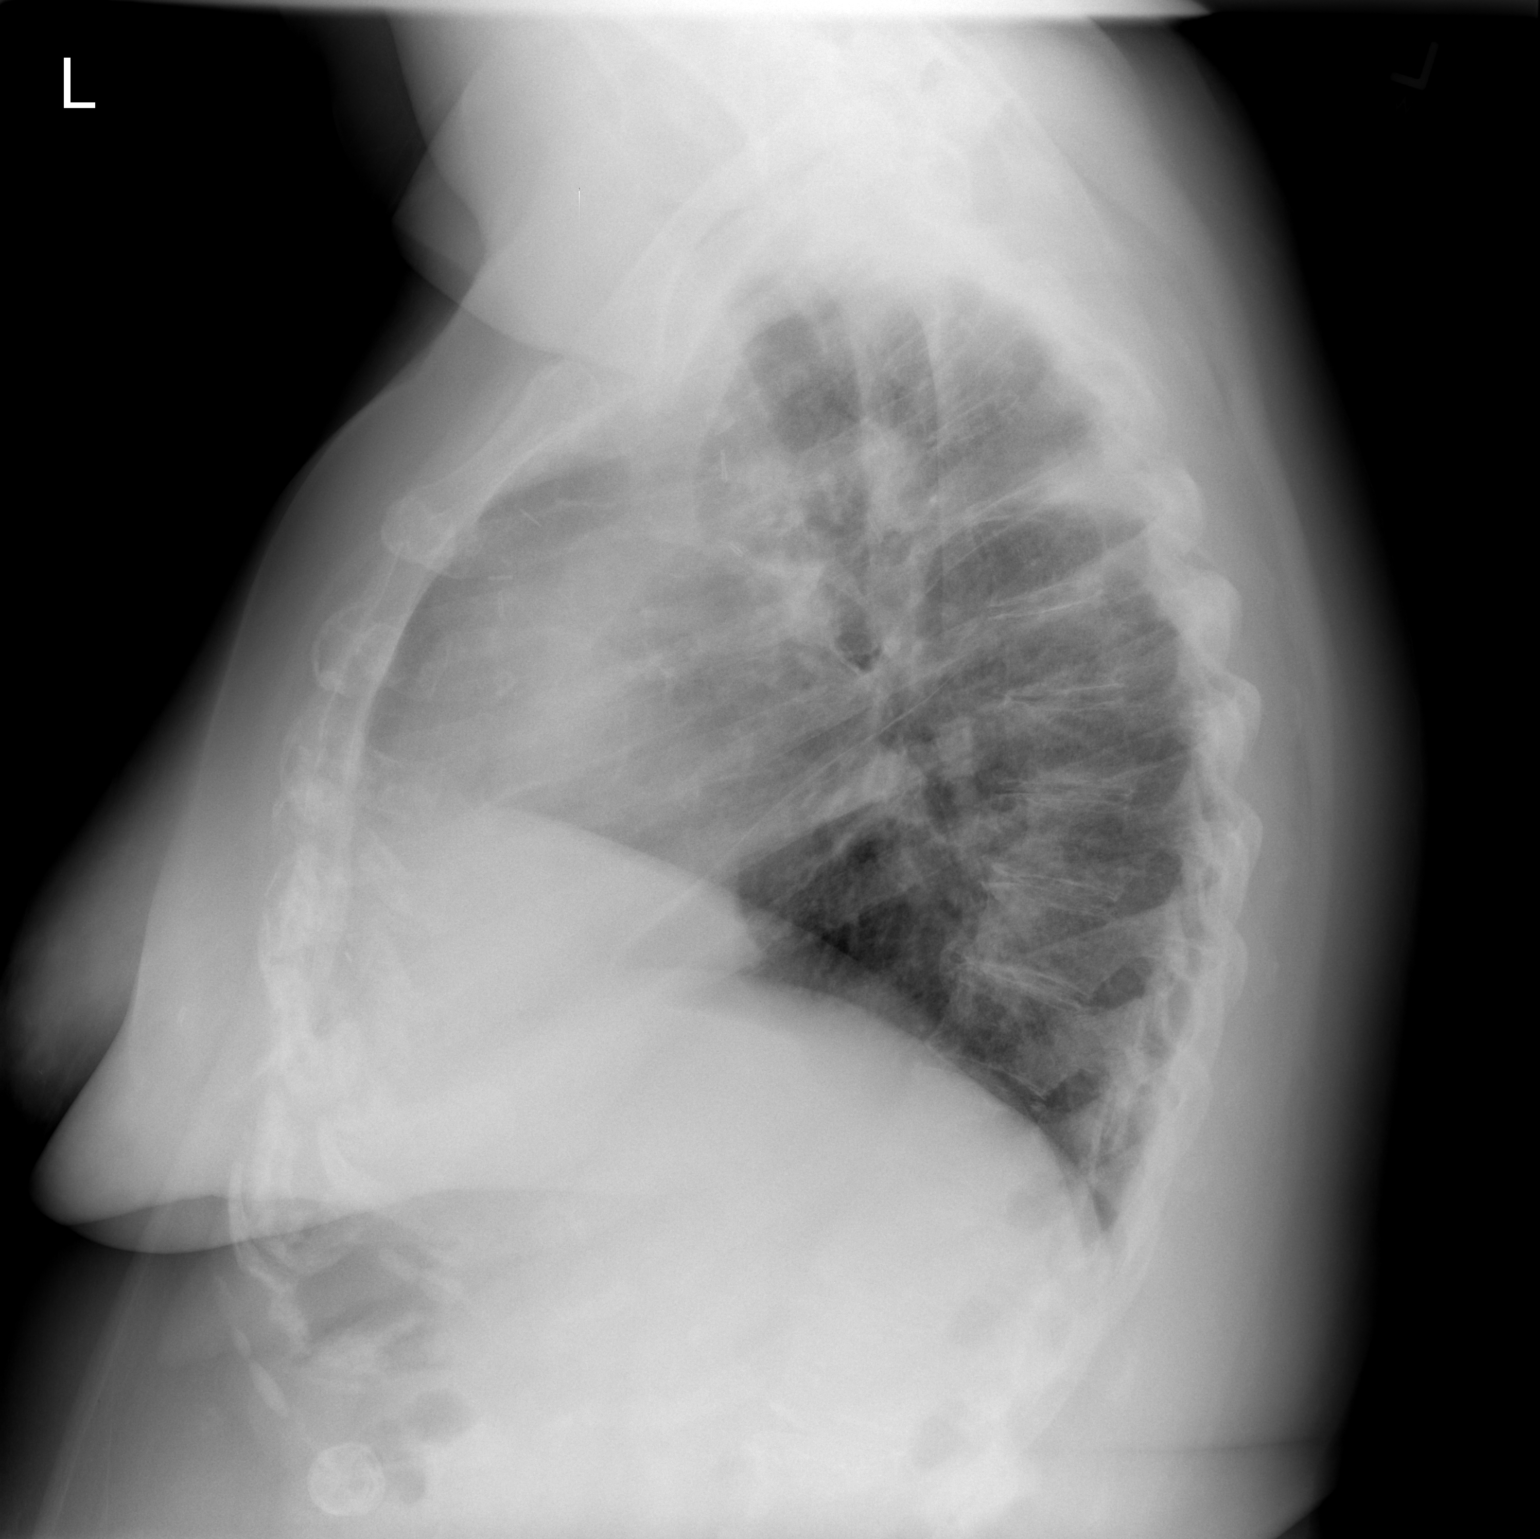

[2 of 2 positions shown; findings below may reference images not displayed]

FINDINGS: Opacity noted previously in the medial right upper lobe has improved
somewhat postoperatively. On the lateral view there is still opacity
posteriorly in an upper lung field probably abutting the pleura
which is unchanged. No definite effusion is seen. Cardiomegaly is
stable. No bony abnormality is noted.
IMPRESSION: Diminishing parenchymal opacity in the right upper lung field
postoperatively. Persistent opacity posteriorly in an upper lung
field on the lateral view. Recommend continued followup.

## 2016-03-16 ENCOUNTER — Ambulatory Visit (HOSPITAL_BASED_OUTPATIENT_CLINIC_OR_DEPARTMENT_OTHER): Payer: PPO | Admitting: Internal Medicine

## 2016-03-16 ENCOUNTER — Encounter: Payer: Self-pay | Admitting: Internal Medicine

## 2016-03-16 VITALS — BP 134/55 | HR 71 | Temp 98.1°F | Resp 22 | Ht 71.0 in | Wt 280.8 lb

## 2016-03-16 DIAGNOSIS — D6481 Anemia due to antineoplastic chemotherapy: Secondary | ICD-10-CM

## 2016-03-16 DIAGNOSIS — N289 Disorder of kidney and ureter, unspecified: Secondary | ICD-10-CM | POA: Diagnosis not present

## 2016-03-16 DIAGNOSIS — R51 Headache: Secondary | ICD-10-CM

## 2016-03-16 DIAGNOSIS — Z853 Personal history of malignant neoplasm of breast: Secondary | ICD-10-CM | POA: Diagnosis not present

## 2016-03-16 DIAGNOSIS — C3431 Malignant neoplasm of lower lobe, right bronchus or lung: Secondary | ICD-10-CM

## 2016-03-16 DIAGNOSIS — Z85118 Personal history of other malignant neoplasm of bronchus and lung: Secondary | ICD-10-CM
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
## 2016-03-20 DIAGNOSIS — J449 Chronic obstructive pulmonary disease, unspecified: Secondary | ICD-10-CM | POA: Diagnosis not present

## 2016-03-21 ENCOUNTER — Other Ambulatory Visit: Payer: Self-pay | Admitting: Nurse Practitioner

## 2016-04-02 ENCOUNTER — Ambulatory Visit (INDEPENDENT_AMBULATORY_CARE_PROVIDER_SITE_OTHER): Payer: PPO | Admitting: Pharmacist

## 2016-04-02 DIAGNOSIS — I4891 Unspecified atrial fibrillation: Secondary | ICD-10-CM | POA: Diagnosis not present

## 2016-04-02 DIAGNOSIS — Z7901 Long term (current) use of anticoagulants: Secondary | ICD-10-CM

## 2016-04-02 LAB — COAGUCHEK XS/INR WAIVED
INR: 2.7 — AB (ref 0.9–1.1)
PROTHROMBIN TIME: 32.7 s

## 2016-04-02 NOTE — Patient Instructions (Signed)
Anticoagulation Dose Instructions as of 04/02/2016      Tammie [name] Tue Wed Thu Fri Sat   New Dose 2.5 mg 2.5 mg 2.5 mg 2.5 mg 2.5 mg 2.5 mg 2.5 mg    Description        Continue current warfarin dose of '5mg'$  tablets - take 1/2 tablet daily.     INR was 2.7 today

## 2016-04-20 DIAGNOSIS — J449 Chronic obstructive pulmonary disease, unspecified: Secondary | ICD-10-CM | POA: Diagnosis not present

## 2016-05-06 ENCOUNTER — Ambulatory Visit (INDEPENDENT_AMBULATORY_CARE_PROVIDER_SITE_OTHER): Payer: PPO | Admitting: Pharmacist

## 2016-05-06 DIAGNOSIS — I4891 Unspecified atrial fibrillation: Secondary | ICD-10-CM | POA: Diagnosis not present

## 2016-05-06 DIAGNOSIS — Z7901 Long term (current) use of anticoagulants: Secondary | ICD-10-CM | POA: Diagnosis not present

## 2016-05-06 LAB — COAGUCHEK XS/INR WAIVED
INR: 4.3 — ABNORMAL HIGH (ref 0.9–1.1)
Prothrombin Time: 51.6 s

## 2016-05-06 NOTE — Patient Instructions (Signed)
Anticoagulation Dose Instructions as of 05/06/2016      Tammie [name] Tue Wed Thu Fri Sat   New Dose 2.5 mg 2.5 mg 2.5 mg 2.5 mg 2.5 mg Hold 2.5 mg    Description        No warfarin today - Thursday, May 11th.  Then decrease dose to 1/2 tablet daily except none on Fridays.       INR was 4.3 today

## 2016-05-20 DIAGNOSIS — J449 Chronic obstructive pulmonary disease, unspecified: Secondary | ICD-10-CM | POA: Diagnosis not present

## 2016-06-10 ENCOUNTER — Ambulatory Visit: Payer: Self-pay | Admitting: Pharmacist

## 2016-06-15 ENCOUNTER — Encounter: Payer: Self-pay | Admitting: Pharmacist

## 2016-06-15 ENCOUNTER — Ambulatory Visit (INDEPENDENT_AMBULATORY_CARE_PROVIDER_SITE_OTHER): Payer: PPO | Admitting: Pharmacist

## 2016-06-15 VITALS — BP 130/60 | HR 78 | Ht 71.0 in | Wt 279.0 lb

## 2016-06-15 DIAGNOSIS — Z0000 Encounter for general adult medical examination without abnormal findings: Secondary | ICD-10-CM | POA: Diagnosis not present

## 2016-06-15 DIAGNOSIS — I4891 Unspecified atrial fibrillation: Secondary | ICD-10-CM | POA: Diagnosis not present

## 2016-06-15 DIAGNOSIS — Z7901 Long term (current) use of anticoagulants: Secondary | ICD-10-CM

## 2016-06-15 LAB — COAGUCHEK XS/INR WAIVED
INR: 2.4 — AB (ref 0.9–1.1)
PROTHROMBIN TIME: 28.9 s

## 2016-06-15 NOTE — Patient Instructions (Addendum)
Anticoagulation Dose Instructions as of 06/15/2016      [name] Tue Wed Thu Fri Sat   New Dose 2.5 mg 2.5 mg 2.5 mg 2.5 mg 2.5 mg Hold 2.5 mg    Description        Continue current warfarin '5mg'$  dose of 1/2 tablet daily except none on Fridays.        Ms. Newcombe , Thank you for taking time to come for your Medicare Wellness Visit. I appreciate your ongoing commitment to your health goals. Please review the following plan we discussed and let me know if I can assist you in the future.   These are the goals we discussed:  Try to use smaller bowls and plates when eating.  Try to increase your fruits and vegetables - goal is to get at least 5 servings (about 1/2 cup each) every day.  Increase whole grains - wheat bread, whole grain rice or quinoa, oatmeal or whole wheat pasta Choose lean proteins and meats - eggs, nuts, seafood, fish, chicken or [place].  Choose either grilled, broiled - avoid fried foods.  Limit high fat food like salad dressings, high fat meats.  Limit sugar intake especially sugar containing beverages.      . Increase physical activity     Increase activity as able.  Start with 5 to 10 minutes once or twice a day and work up to 30 minutes daily as able.   Can try walking inside or chair exercise.      . Reduce salt intake to 2 grams per day or less       This is a list of the screening recommended for you and due dates:  Health Maintenance  Topic Date Due  . Shingles Vaccine  Not recommended since history of shingles rash  . Flu Shot  07/27/2016  . Colon Cancer Screening  12/27/2016  . Mammogram  03/01/2017  . DEXA scan (bone density measurement)  06/10/2020  . Tetanus Vaccine  07/20/2022  . Pneumonia vaccines  Completed
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
## 2016-06-20 DIAGNOSIS — J449 Chronic obstructive pulmonary disease, unspecified: Secondary | ICD-10-CM | POA: Diagnosis not present

## 2016-06-21 ENCOUNTER — Other Ambulatory Visit: Payer: Self-pay | Admitting: Pediatrics

## 2016-06-23 ENCOUNTER — Ambulatory Visit: Payer: Self-pay | Admitting: Pharmacist

## 2016-06-23 ENCOUNTER — Other Ambulatory Visit: Payer: Self-pay | Admitting: *Deleted

## 2016-06-23 DIAGNOSIS — R609 Edema, unspecified: Secondary | ICD-10-CM

## 2016-06-23 DIAGNOSIS — I10 Essential (primary) hypertension: Secondary | ICD-10-CM

## 2016-06-23 MED ORDER — FUROSEMIDE 40 MG PO TABS: 40.0000 mg | ORAL_TABLET | Freq: Two times a day (BID) | ORAL | Status: DC

## 2016-06-23 MED ORDER — SIMVASTATIN 10 MG PO TABS: 10.0000 mg | ORAL_TABLET | Freq: Every day | ORAL | Status: DC

## 2016-06-23 MED ORDER — METOPROLOL TARTRATE 100 MG PO TABS: 150.0000 mg | ORAL_TABLET | Freq: Two times a day (BID) | ORAL | Status: DC

## 2016-07-03 IMAGING — CT CT CHEST W/O CM
2 of 4 series · 14 of 36 positions shown, 17 images · non-contrast
Comparison: PET-CT 06/11/2014.  Chest CT 05/02/2014.

CLINICAL DATA: Subsequent evaluation of the a 73-year-old female
with history of Lung Cancer bilaterally diagnosed in 0040 and
right-sided breast cancer diagnosed in 8430 status post radiation
therapy to the breast (complete), bilateral wedge resections in the
lungs, and chemotherapy for lung cancer now complete. Shortness of
breath.

EXAM:
CT CHEST WITHOUT CONTRAST
TECHNIQUE: Multidetector CT imaging of the chest was performed following the
standard protocol without IV contrast..

[Series 2: chest w/o st · axial · non-contrast · 0.86mm/px · z∈[-294,-39]mm · 11 of 61 slices shown, 14 images]
[im 5/61  mediastinal]
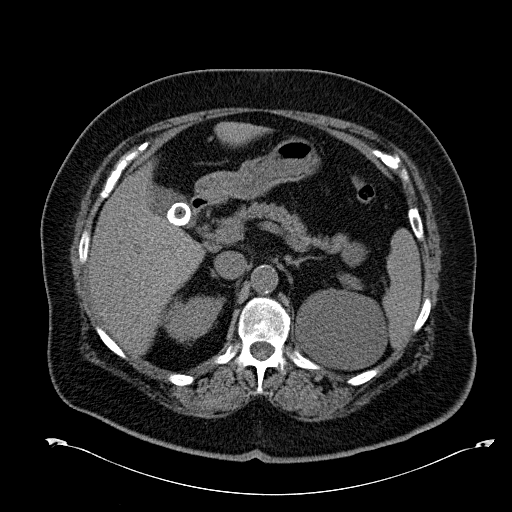
[im 5/61  lung]
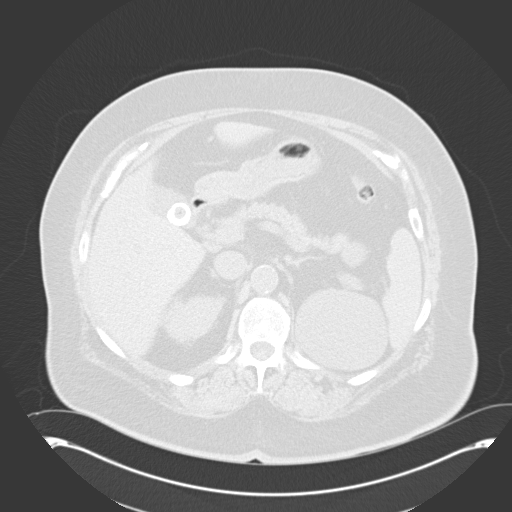
[im 9/61  lung]
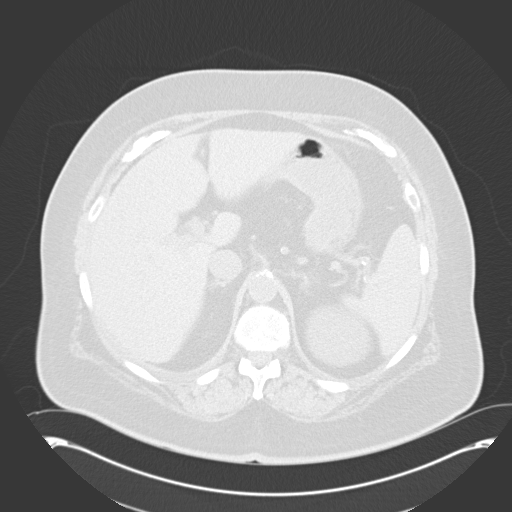
[im 13/61  lung]
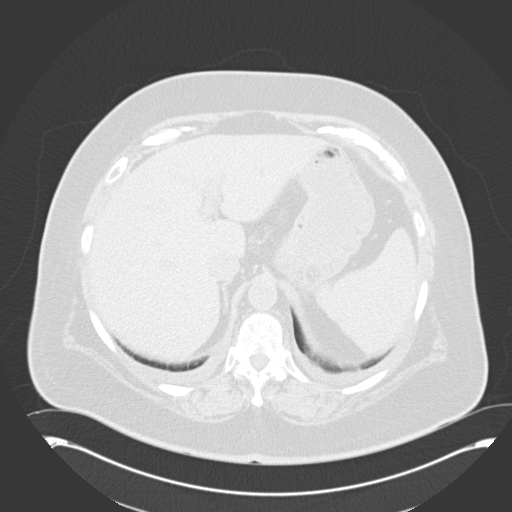
[im 22/61  lung]
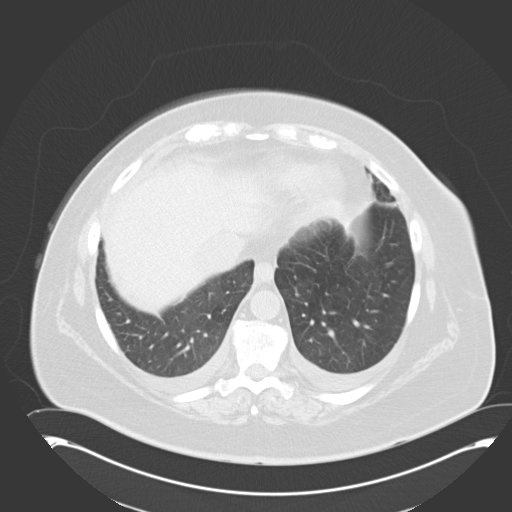
[im 26/61  mediastinal]
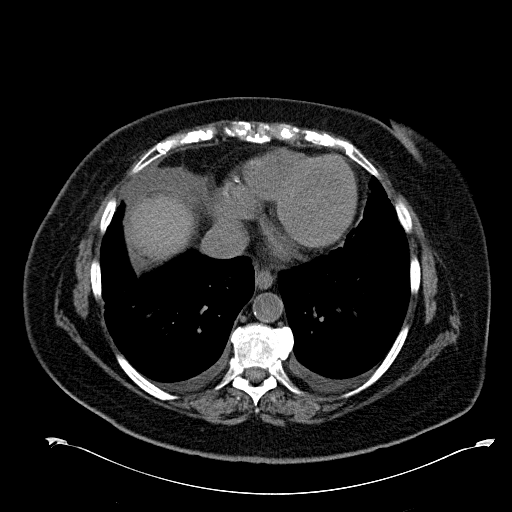
[im 26/61  lung]
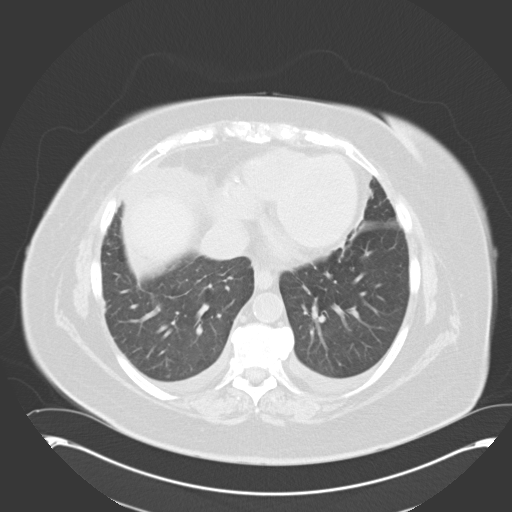
[im 31/61  lung]
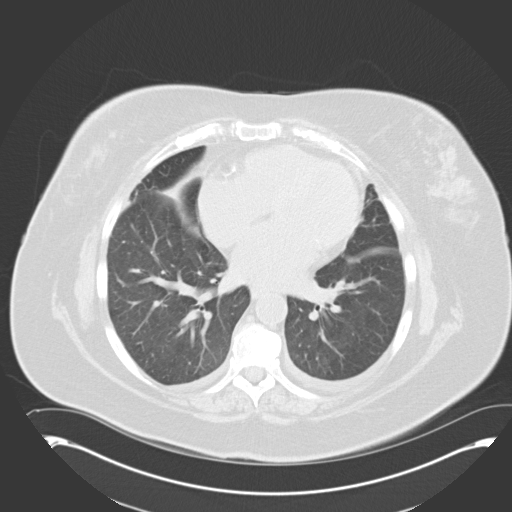
[im 35/61  lung]
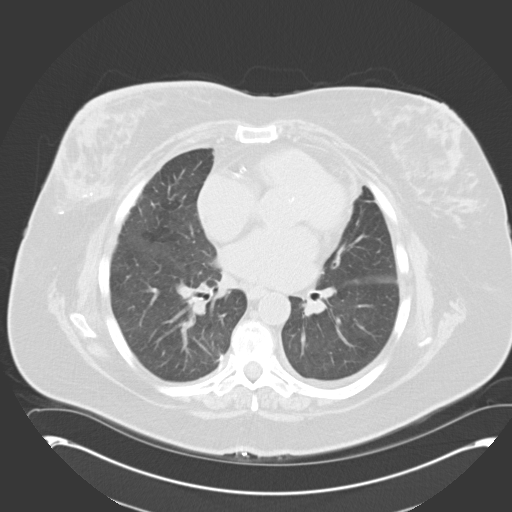
[im 39/61  lung]
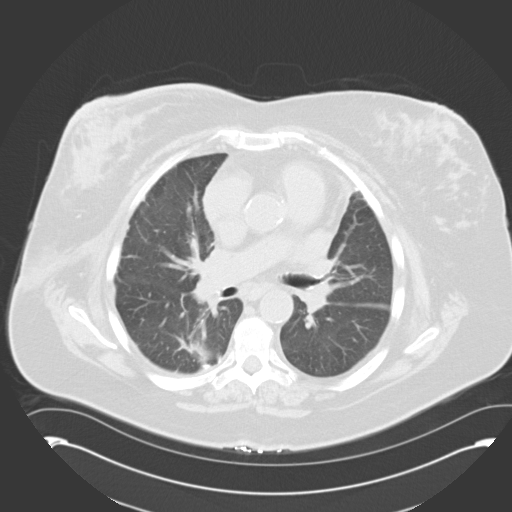
[im 48/61  mediastinal]
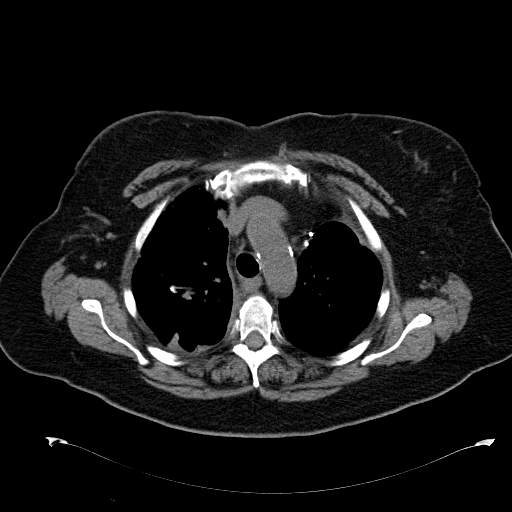
[im 48/61  lung]
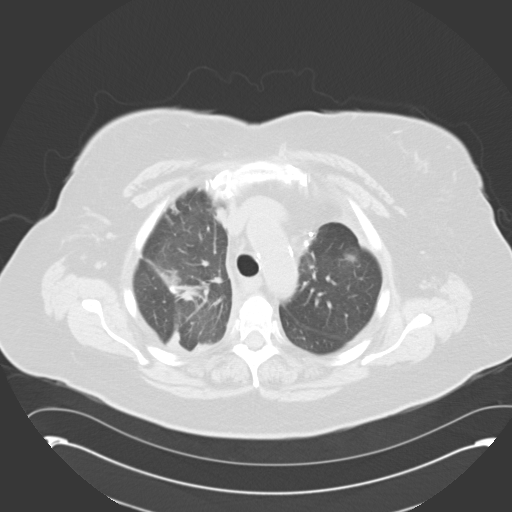
[im 52/61  lung]
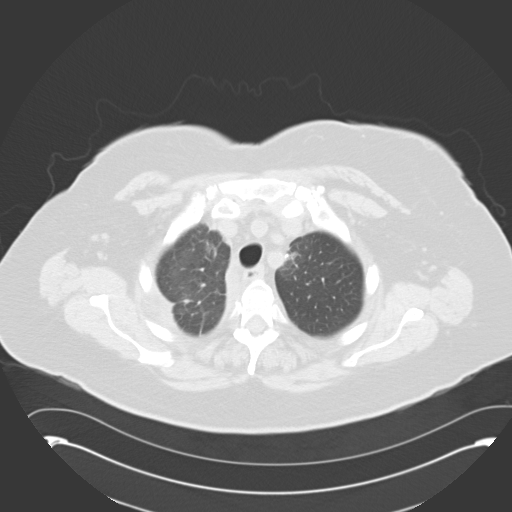
[im 56/61  lung]
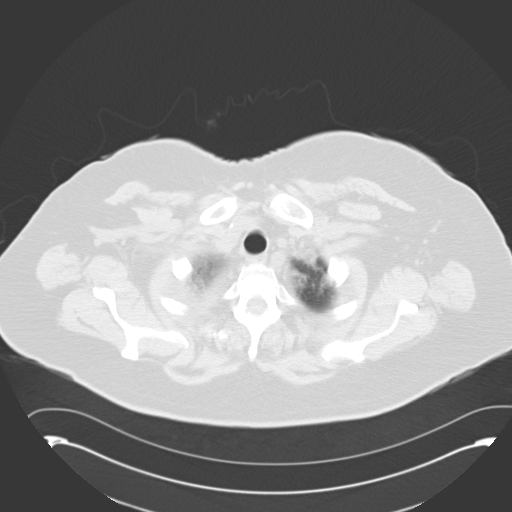

[Series 602: <mpr thick range> · coronal · 0.86mm/px · 3 of 99 slices shown]
[im 20/99  lung]
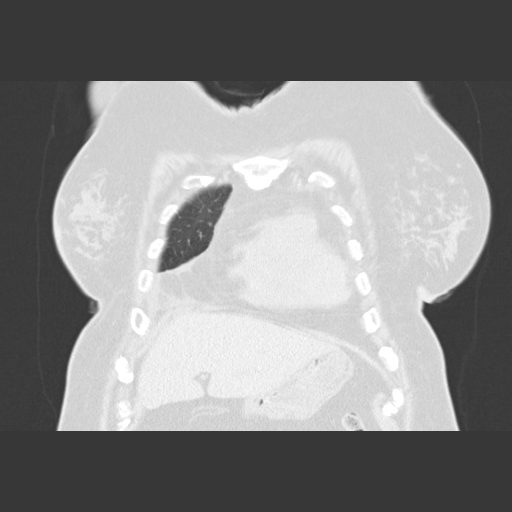
[im 40/99  lung]
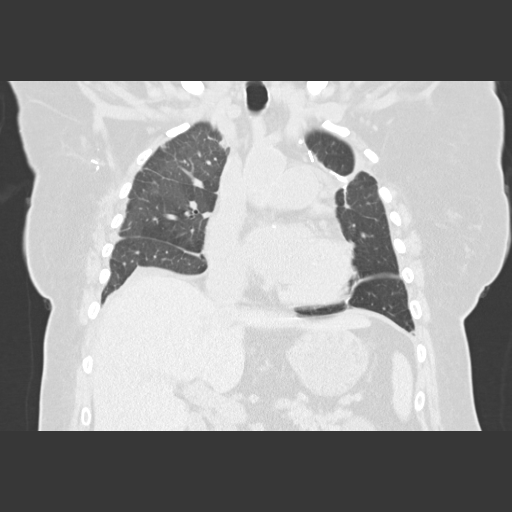
[im 59/99  lung]
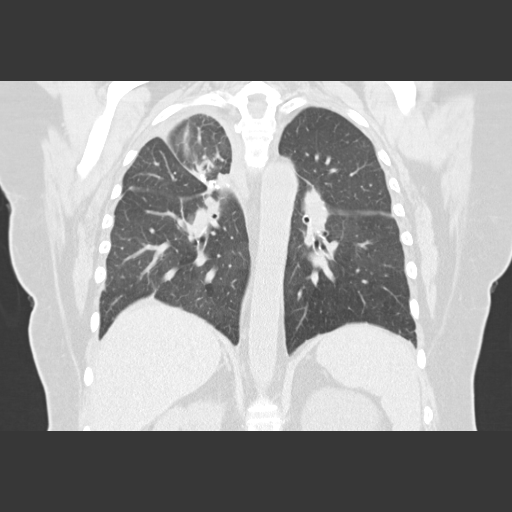

[14 of 36 positions shown; findings below may reference images not displayed]

FINDINGS: Mediastinum: Heart size is mildly enlarged with left atrial
dilatation. There is no significant pericardial fluid, thickening or
pericardial calcification. There is atherosclerosis of the thoracic
aorta, the great vessels of the mediastinum and the coronary
arteries, including calcified atherosclerotic plaque in the left
main, left anterior descending, left circumflex and right coronary
arteries. No pathologically enlarged mediastinal or hilar lymph
nodes. Please note that accurate exclusion of hilar adenopathy is
limited on noncontrast CT scans. Esophagus is unremarkable in
appearance.

Lungs/Pleura: Compared to the prior study, there has been interval
wedge resection in the right upper lobe and in the superior segment
of the right lower lobe. Both the solid nodule in the right upper
lobe and sub solid nodule in the superior segment of the right lower
lobe seen on the prior PET-CT from 06/11/2014 have been resected.
There are several areas of thickening along the resection line in
the right upper lobe, which are nonspecific, favored to be areas of
postoperative scarring. 2 other previously noted small ground-glass
attenuation nodules in the right lung are no longer evident.
Postoperative changes of wedge resection in the left upper lobe are
unchanged compared to the prior study. No new suspicious appearing
pulmonary nodules or masses are noted. No acute consolidative
airspace disease. Small bilateral pleural effusions layering
dependently are simple in appearance.

Upper Abdomen: 16 mm rim calcified gallstone in the neck of the
gallbladder. 7 mm nonobstructive calculus in the upper pole
collecting system of the right kidney. Large low-attenuation lesions
extending exophytically from the upper pole of the left kidney,
incompletely visualized and incompletely characterized on today's
non contrast CT examination, but presumably cysts, largest of which
measures at least 8.1 cm in the posterior aspect of the upper pole
of the left kidney.

Musculoskeletal: There are no aggressive appearing lytic or blastic
lesions noted in the visualized portions of the skeleton.
IMPRESSION: 1. Postoperative changes of bilateral upper lobe wedge resection
(old on the left, new on the right). There is some thickening along
the resection margin in the right upper lobe, which is presumably
some postoperative scarring. Close attention to this on followup
studies is recommended to ensure the stability or resolution of this
finding, to exclude the presence of residual disease.
2. Previously noted sub solid nodules in the right lower lobe and
right middle lobe, separate from the areas of resection, no longer
identified on today's study. This could imply that they were of
infectious or inflammatory etiology, or have responded to
chemotherapy.
3. Small bilateral pleural effusions layering dependently are simple
in appearance.
4. Atherosclerosis, including left main and 3 vessel coronary artery
disease. Please note that although the presence of coronary artery
calcium documents the presence of coronary artery disease, the
severity of this disease and any potential stenosis cannot be
assessed on this non-gated CT examination. Assessment for potential
risk factor modification, dietary therapy or pharmacologic therapy
may be warranted, if clinically indicated.
5. Cholelithiasis without evidence to suggest acute cholecystitis at
this time.
6. 7 mm nonobstructive calculus in the upper pole collecting system
of the right kidney.
7. Additional incidental findings, as above.

## 2016-07-20 DIAGNOSIS — J449 Chronic obstructive pulmonary disease, unspecified: Secondary | ICD-10-CM | POA: Diagnosis not present

## 2016-07-26 ENCOUNTER — Encounter: Payer: Self-pay | Admitting: Pediatrics

## 2016-07-26 ENCOUNTER — Ambulatory Visit (INDEPENDENT_AMBULATORY_CARE_PROVIDER_SITE_OTHER): Payer: PPO | Admitting: Pediatrics

## 2016-07-26 VITALS — BP 120/66 | HR 66 | Temp 96.9°F | Ht 71.0 in | Wt 277.2 lb

## 2016-07-26 DIAGNOSIS — I4891 Unspecified atrial fibrillation: Secondary | ICD-10-CM | POA: Diagnosis not present

## 2016-07-26 DIAGNOSIS — Z7901 Long term (current) use of anticoagulants: Secondary | ICD-10-CM

## 2016-07-26 DIAGNOSIS — D649 Anemia, unspecified: Secondary | ICD-10-CM | POA: Diagnosis not present

## 2016-07-26 DIAGNOSIS — G629 Polyneuropathy, unspecified: Secondary | ICD-10-CM | POA: Insufficient documentation

## 2016-07-26 DIAGNOSIS — I10 Essential (primary) hypertension: Secondary | ICD-10-CM

## 2016-07-26 DIAGNOSIS — R739 Hyperglycemia, unspecified: Secondary | ICD-10-CM | POA: Diagnosis not present

## 2016-07-26 DIAGNOSIS — N289 Disorder of kidney and ureter, unspecified: Secondary | ICD-10-CM | POA: Diagnosis not present

## 2016-07-26 DIAGNOSIS — IMO0001 Reserved for inherently not codable concepts without codable children: Secondary | ICD-10-CM

## 2016-07-26 DIAGNOSIS — R0609 Other forms of dyspnea: Secondary | ICD-10-CM | POA: Diagnosis not present

## 2016-07-26 DIAGNOSIS — J449 Chronic obstructive pulmonary disease, unspecified: Secondary | ICD-10-CM

## 2016-07-26 LAB — BAYER DCA HB A1C WAIVED: HB A1C: 5.4 % (ref <7.0)

## 2016-07-26 LAB — COAGUCHEK XS/INR WAIVED
INR: 2.2 — ABNORMAL HIGH (ref 0.9–1.1)
Prothrombin Time: 26.5 s

## 2016-07-26 MED ORDER — TIOTROPIUM BROMIDE MONOHYDRATE 18 MCG IN CAPS: 18.0000 ug | ORAL_CAPSULE | Freq: Every day | RESPIRATORY_TRACT | 6 refills | Status: DC

## 2016-07-26 NOTE — Progress Notes (Signed)
    Subjective:    Patient ID: Tammie [name], female    DOB: May 19, 1941, 75 y.o.   MRN: [id number]  CC: Follow-up multiple med problems  HPI: SONIYAH [name] is a 75 y.o. female presenting for Follow-up  Still with numbness L hand, since surgery for breast cancer  Primarily ulnar side Pinky tip with decreased feeling  Overall feeling well  No CP, no swelling  SOB slightly worse than usual she thinks due to the weather, has had two surgeries to remove lung masses Using albuterol regularly Also with COPD, PFTs in chart, not on controller med  Follows with Dr. Inda Merlin for lung ca  Depression screen [facility] 2/9 07/26/2016 06/15/2016 02/06/2016 08/06/2015 06/11/2015  Decreased Interest 0 0 0 0 0  Down, Depressed, Hopeless 0 0 0 0 0  PHQ - 2 Score 0 0 0 0 0  Some recent data might be hidden     Relevant past medical, surgical, family and social history reviewed and updated. Interim medical history since our last visit reviewed. Allergies and medications reviewed and updated.  History  Smoking Status  . Former Smoker  . Packs/day: 3.00  . Years: 32.00  . Types: Cigarettes  . Start date: 12/27/1989  . Quit date: 03/08/1990  Smokeless Tobacco  . Former Systems developer  . Quit date: 03/08/1990    Comment: smoked 3ppd from 1959-1991     ROS: Per HPI      Objective:    BP 120/66 (BP Location: Right Arm, Patient Position: Sitting, Cuff Size: Large)   Pulse 66   Temp (!) 96.9 F (36.1 C) (Oral)   Ht '5\' 11"'$  (1.803 m)   Wt 277 lb 3.2 oz (125.7 kg)   BMI 38.66 kg/m   Wt Readings from Last 3 Encounters:  07/26/16 277 lb 3.2 oz (125.7 kg)  06/15/16 279 lb (126.6 kg)  03/16/16 280 lb 12.8 oz (127.4 kg)     Gen: NAD, alert, cooperative with exam, NCAT EYES: EOMI, no scleral injection or icterus ENT:  OP without erythema LYMPH: no cervical LAD CV: NRRR, normal S1/S2,  distal pulses 2+ b/l Resp: CTABL, moving air fair, normal WOB Abd: +BS, soft, NTND. no guarding or organomegaly Ext: No  edema, warm Neuro: Alert and oriented, strength equal b/l UE and LE, decreased sensation L pinky     Assessment & Plan:    Tammie [name] was seen today for follow-up multiple med problems  Diagnoses and all orders for this visit:  Essential hypertension Well controlled  Cont current meds -     BMP8+EGFR  COPD bronchitis -     tiotropium (SPIRIVA HANDIHALER) 18 MCG inhalation capsule; Place 1 capsule (18 mcg total) into inhaler and inhale daily.  DOE (dyspnea on exertion) Cont oxygen as needed  Renal insufficiency Follows with nephrology Avoid NSAIDs  Atrial fibrillation with RVR (HCC) INR 2.2, cont current dosing -     CoaguChek XS/INR Waived  Neuropathy (HCC) -     Vitamin B12 -     CBC with Differential/Platelet -     Folate -     TSH -     Ferritin  Hyperglycemia -     Bayer DCA Hb A1c Waived  Anemia, unspecified anemia type -     CBC with Differential/Platelet -     Ferritin  Follow up plan: Return for 1 mo with [name] for INR, 6 months Dr. Evette Doffing.  [name], MD [facility] 07/26/2016, 12:01 PM

## 2016-07-27 LAB — CBC WITH DIFFERENTIAL/PLATELET
BASOS ABS: 0 10*3/uL (ref 0.0–0.2)
Basos: 1 %
EOS (ABSOLUTE): 0.1 10*3/uL (ref 0.0–0.4)
Eos: 2 %
HEMOGLOBIN: 10.3 g/dL — AB (ref 11.1–15.9)
Hematocrit: 32.8 % — ABNORMAL LOW (ref 34.0–46.6)
IMMATURE GRANS (ABS): 0 10*3/uL (ref 0.0–0.1)
IMMATURE GRANULOCYTES: 0 %
LYMPHS: 20 %
Lymphocytes Absolute: 1.2 10*3/uL (ref 0.7–3.1)
MCH: 27.1 pg (ref 26.6–33.0)
MCHC: 31.4 g/dL — ABNORMAL LOW (ref 31.5–35.7)
MCV: 86 fL (ref 79–97)
MONOCYTES: 7 %
Monocytes Absolute: 0.4 10*3/uL (ref 0.1–0.9)
NEUTROS ABS: 4 10*3/uL (ref 1.4–7.0)
NEUTROS PCT: 70 %
PLATELETS: 208 10*3/uL (ref 150–379)
RBC: 3.8 x10E6/uL (ref 3.77–5.28)
RDW: 13.7 % (ref 12.3–15.4)
WBC: 5.8 10*3/uL (ref 3.4–10.8)

## 2016-07-27 LAB — BMP8+EGFR
BUN/Creatinine Ratio: 20 (ref 12–28)
BUN: 36 mg/dL — AB (ref 8–27)
CALCIUM: 9.1 mg/dL (ref 8.7–10.3)
CHLORIDE: 105 mmol/L (ref 96–106)
CO2: 22 mmol/L (ref 18–29)
Creatinine, Ser: 1.83 mg/dL — ABNORMAL HIGH (ref 0.57–1.00)
GFR calc non Af Amer: 27 mL/min/{1.73_m2} — ABNORMAL LOW (ref >59)
GFR, EST AFRICAN AMERICAN: 31 mL/min/{1.73_m2} — AB (ref >59)
Glucose: 97 mg/dL (ref 65–99)
Potassium: 5.2 mmol/L (ref 3.5–5.2)
Sodium: 143 mmol/L (ref 134–144)

## 2016-07-27 LAB — FOLATE: FOLATE: 13.9 ng/mL (ref >3.0)

## 2016-07-27 LAB — TSH: TSH: 3.97 u[IU]/mL (ref 0.450–4.500)

## 2016-07-27 LAB — VITAMIN B12: Vitamin B-12: 351 pg/mL (ref 211–946)

## 2016-07-27 LAB — FERRITIN: FERRITIN: 31 ng/mL (ref 15–150)

## 2016-07-28 ENCOUNTER — Telehealth: Payer: Self-pay | Admitting: Pediatrics

## 2016-08-02 DIAGNOSIS — N183 Chronic kidney disease, stage 3 (moderate): Secondary | ICD-10-CM | POA: Diagnosis not present

## 2016-08-02 DIAGNOSIS — Z6841 Body Mass Index (BMI) 40.0 and over, adult: Secondary | ICD-10-CM | POA: Diagnosis not present

## 2016-08-02 DIAGNOSIS — I10 Essential (primary) hypertension: Secondary | ICD-10-CM | POA: Diagnosis not present

## 2016-08-03 ENCOUNTER — Ambulatory Visit: Payer: PPO | Admitting: Thoracic Surgery (Cardiothoracic Vascular Surgery)

## 2016-08-09 ENCOUNTER — Other Ambulatory Visit: Payer: Self-pay | Admitting: Thoracic Surgery (Cardiothoracic Vascular Surgery)

## 2016-08-09 DIAGNOSIS — C3490 Malignant neoplasm of unspecified part of unspecified bronchus or lung: Secondary | ICD-10-CM

## 2016-08-10 ENCOUNTER — Encounter: Payer: Self-pay | Admitting: Thoracic Surgery (Cardiothoracic Vascular Surgery)

## 2016-08-10 ENCOUNTER — Ambulatory Visit (INDEPENDENT_AMBULATORY_CARE_PROVIDER_SITE_OTHER): Payer: PPO | Admitting: Thoracic Surgery (Cardiothoracic Vascular Surgery)

## 2016-08-10 ENCOUNTER — Ambulatory Visit
Admission: RE | Admit: 2016-08-10 | Discharge: 2016-08-10 | Disposition: A | Discharge status: Home / Self Care | Payer: PPO | Source: Ambulatory Visit | Attending: Thoracic Surgery (Cardiothoracic Vascular Surgery) | Admitting: Thoracic Surgery (Cardiothoracic Vascular Surgery)

## 2016-08-10 VITALS — BP 121/68 | HR 75 | Resp 20 | Ht 71.0 in | Wt 277.0 lb

## 2016-08-10 DIAGNOSIS — C3411 Malignant neoplasm of upper lobe, right bronchus or lung: Secondary | ICD-10-CM | POA: Diagnosis not present

## 2016-08-10 DIAGNOSIS — C3490 Malignant neoplasm of unspecified part of unspecified bronchus or lung: Secondary | ICD-10-CM

## 2016-08-10 DIAGNOSIS — Z09 Encounter for follow-up examination after completed treatment for conditions other than malignant neoplasm: Secondary | ICD-10-CM
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
## 2016-08-18 IMAGING — MG MM DIGITAL DIAGNOSTIC BILAT
6 series · 6 of 6 positions shown · non-contrast
Comparison: 12/17/2013 dating back to 10/10/2013.

CLINICAL DATA: Malignant lumpectomy of the upper outer quadrant of
the right breast in November 2013 with adjuvant radiation therapy.
Annual evaluation. History of bilateral lung cancers requiring
lobectomies, chemotherapy and radiation therapy.

EXAM:
DIGITAL DIAGNOSTIC BILATERAL MAMMOGRAM WITH CAD

[R MLO]
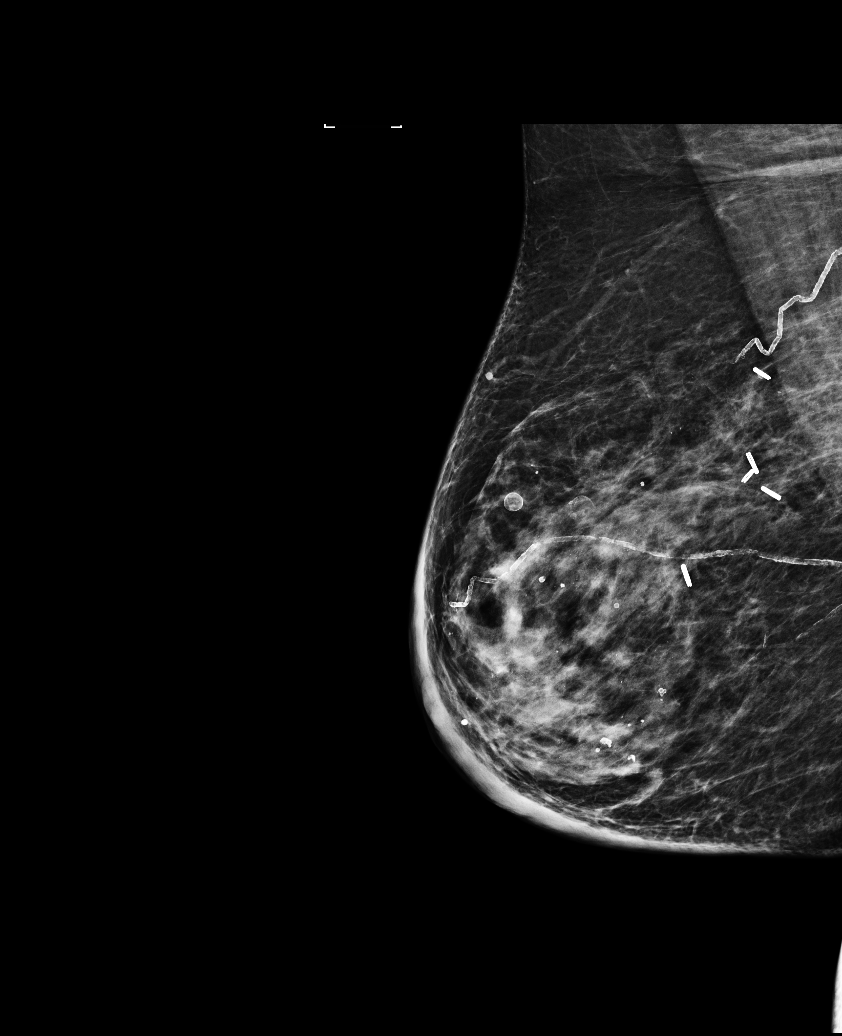

[R TAN (1 of 2)]
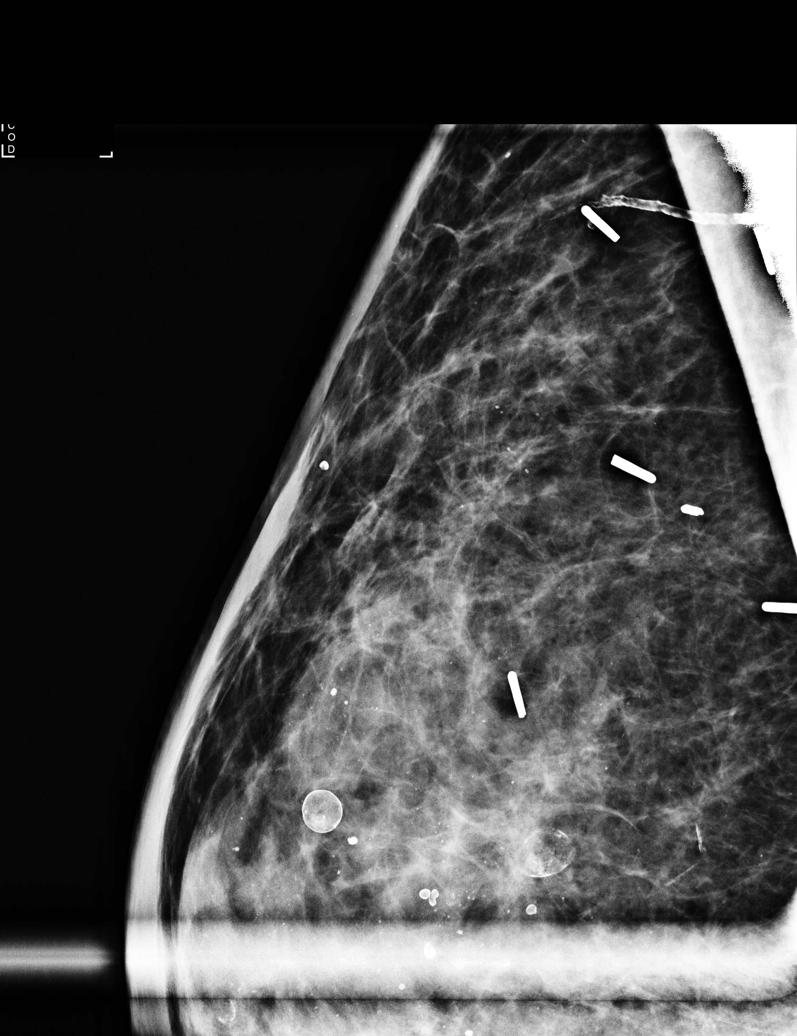

[L CC]
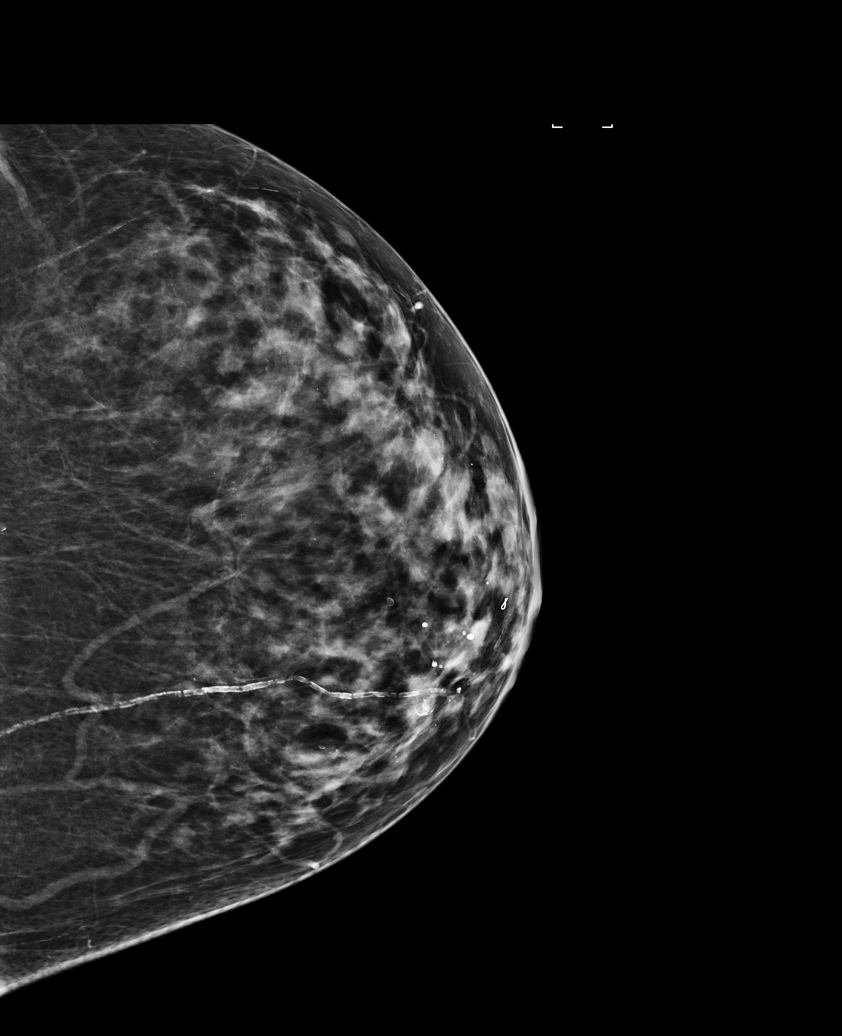

[L MLO]
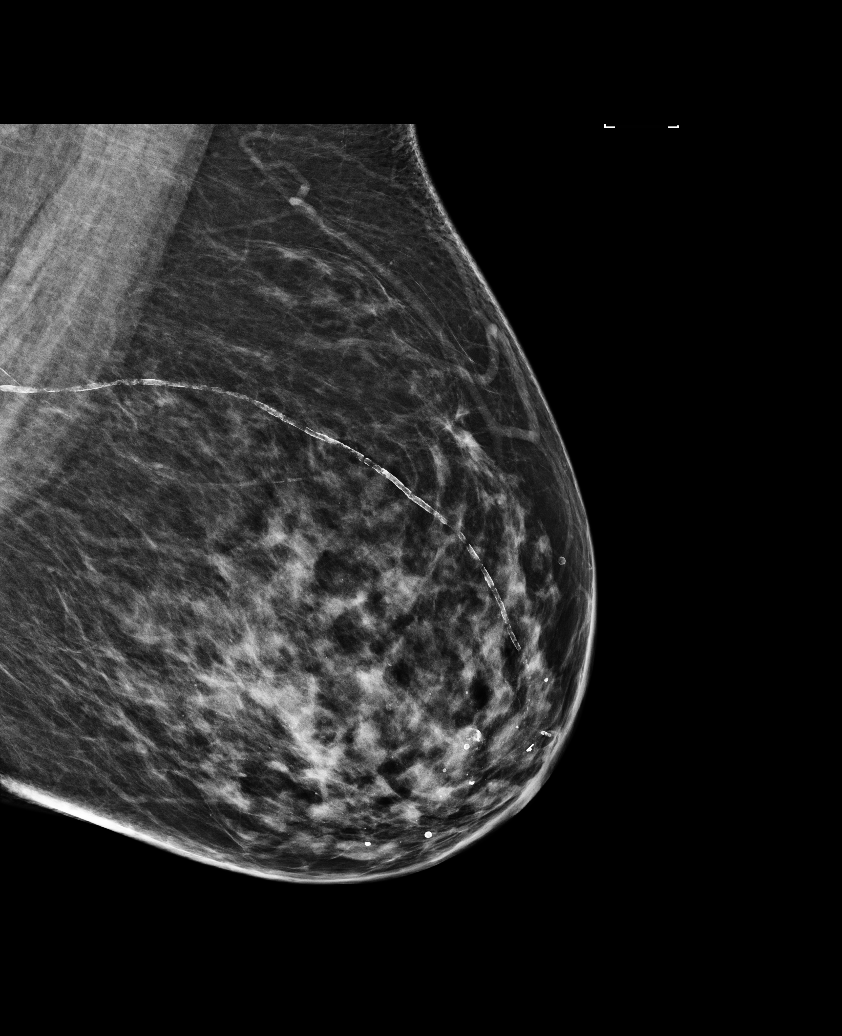

[R CC]
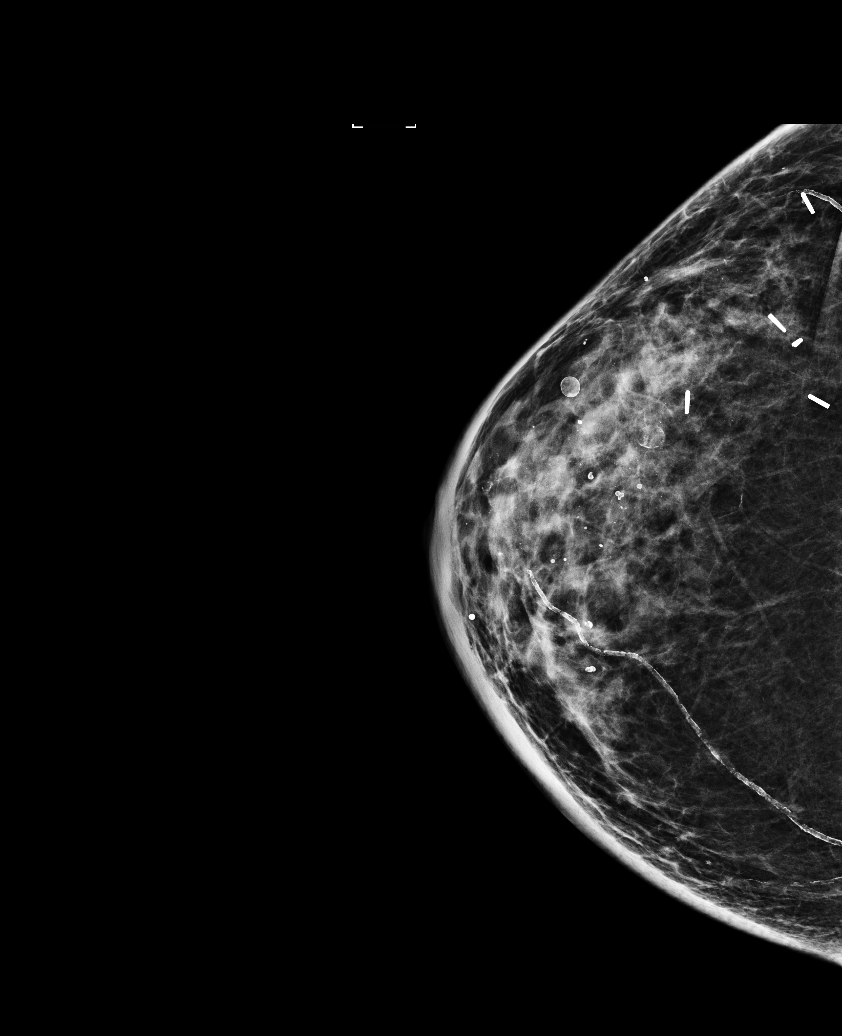

[R TAN (2 of 2)]
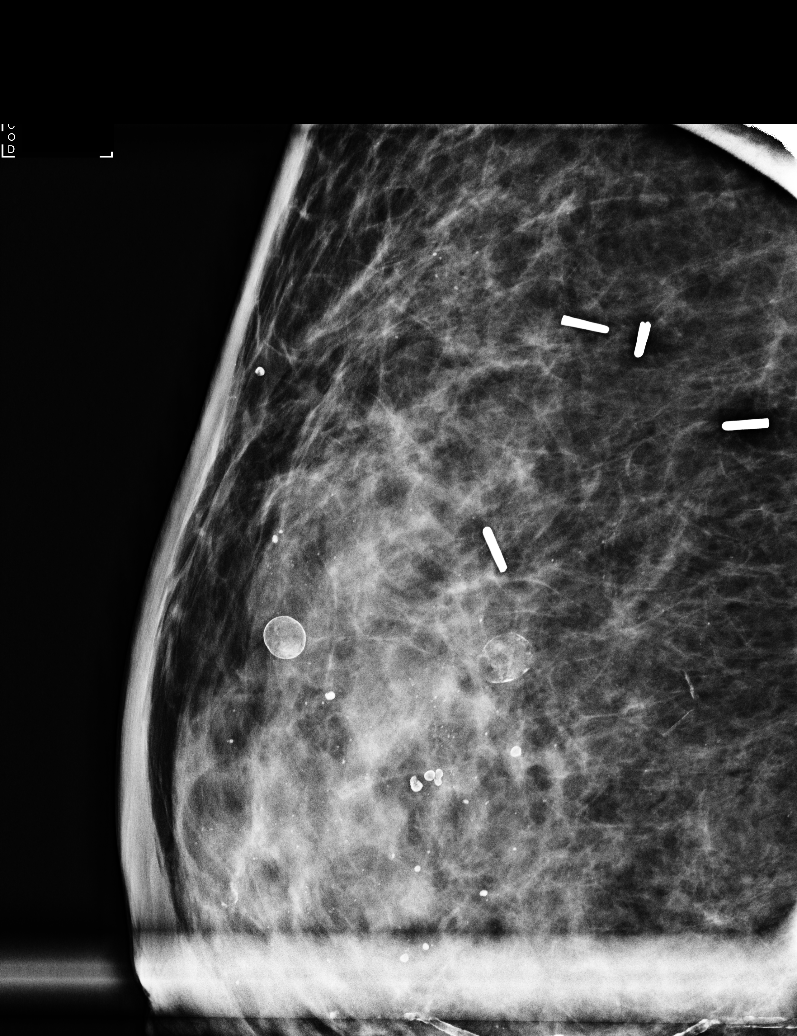

[6 of 6 positions shown; findings below may reference images not displayed]

ACR Breast Density Category c: The breast tissue is heterogeneously
dense, which may obscure small masses.
FINDINGS: CC and MLO views of both breasts and spot tangential views of the
lumpectomy site in the right breast were obtained. Post lumpectomy
scarring in the upper outer right breast, posterior depth. Post
radiation skin thickening and trabecular thickening throughout the
right breast. Scarring in the right axilla at the site of node
removal.

No findings suspicious for malignancy in either breast.

Mammographic images were processed with CAD.
IMPRESSION: No specific mammographic evidence of malignancy. Expected post
lumpectomy and post radiation changes in the right breast.

RECOMMENDATION:
Bilateral diagnostic mammography in 1 year.

I have discussed the findings and recommendations with the patient.
Results were also provided in writing at the conclusion of the
visit. If applicable, a reminder letter will be sent to the patient
regarding the next appointment.

BI-RADS CATEGORY  2: Benign.

## 2016-08-20 DIAGNOSIS — J449 Chronic obstructive pulmonary disease, unspecified: Secondary | ICD-10-CM | POA: Diagnosis not present

## 2016-08-26 ENCOUNTER — Ambulatory Visit (INDEPENDENT_AMBULATORY_CARE_PROVIDER_SITE_OTHER): Payer: PPO | Admitting: Pharmacist

## 2016-08-26 DIAGNOSIS — I4891 Unspecified atrial fibrillation: Secondary | ICD-10-CM

## 2016-08-26 DIAGNOSIS — Z7901 Long term (current) use of anticoagulants: Secondary | ICD-10-CM

## 2016-08-26 LAB — COAGUCHEK XS/INR WAIVED
INR: 1.7 — ABNORMAL HIGH (ref 0.9–1.1)
Prothrombin Time: 21 s

## 2016-09-05 IMAGING — CT CT CHEST W/O CM
2 of 5 series · 16 of 30 positions shown, 19 images · non-contrast
Comparison: 12/16/2014

CLINICAL DATA: History of bilateral lung cancer. Previous lung
surgery and chemotherapy. History of breast cancer in 0677.

EXAM:
CT CHEST WITHOUT CONTRAST
TECHNIQUE: Multidetector CT imaging of the chest was performed following the
standard protocol without IV contrast..

[Series 2: thin chest · axial · 0.78mm/px · z∈[-265,-3]mm · 12 of 310 slices shown, 15 images]
[im 24/310  mediastinal]
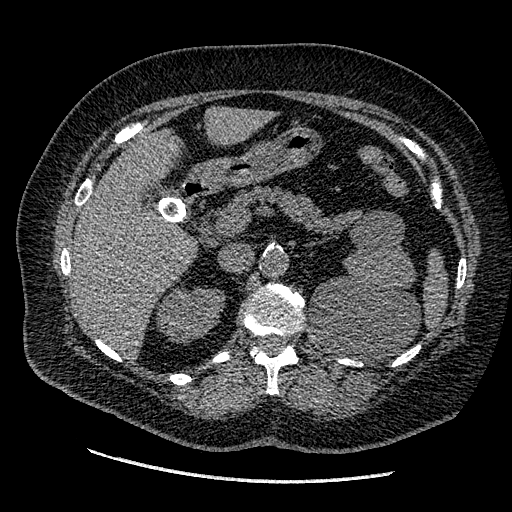
[im 24/310  lung]
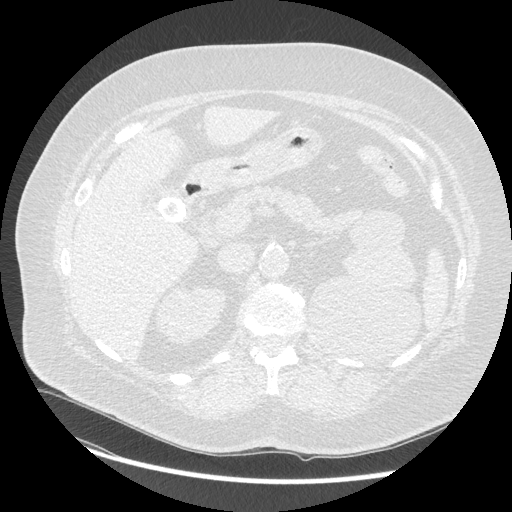
[im 48/310  lung]
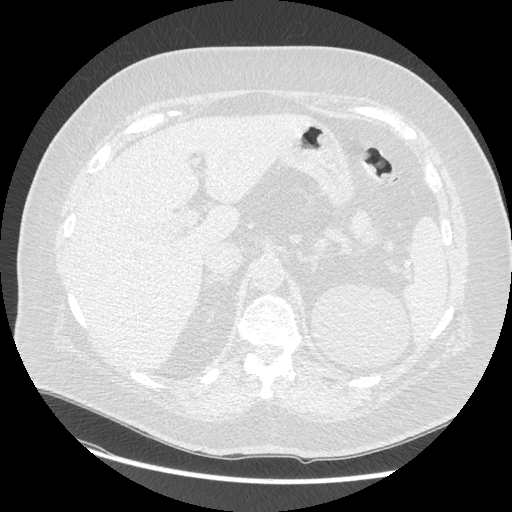
[im 72/310  lung]
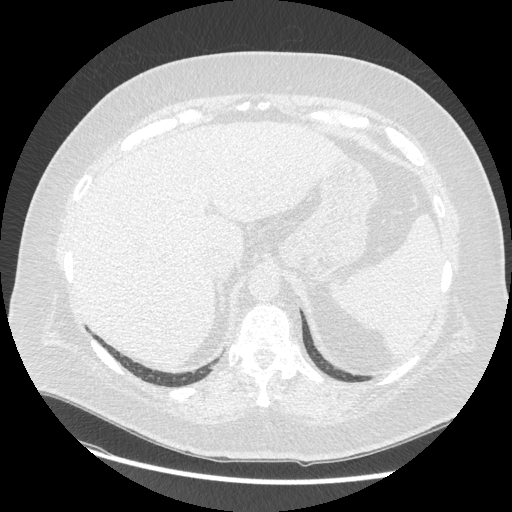
[im 96/310  lung]
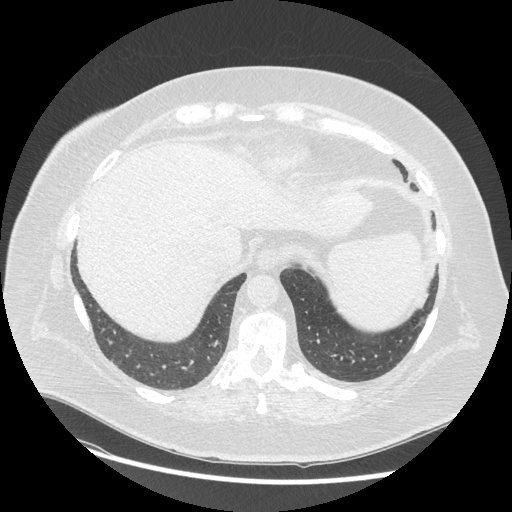
[im 119/310  mediastinal]
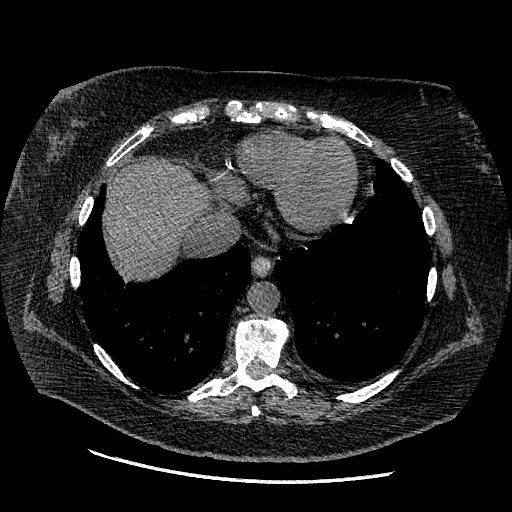
[im 119/310  lung]
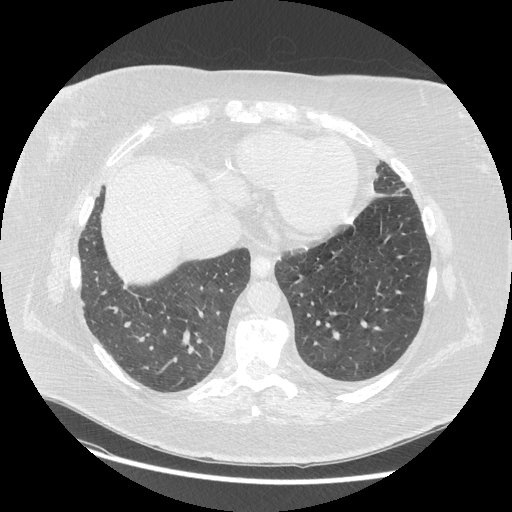
[im 143/310  lung]
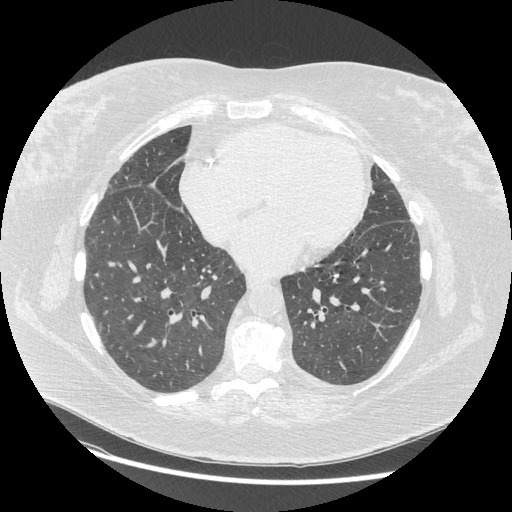
[im 167/310  lung]
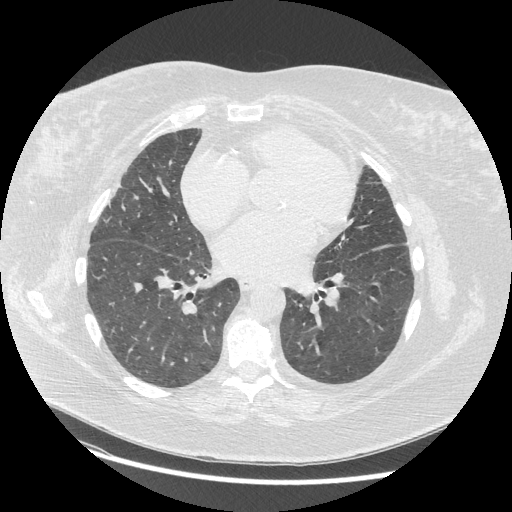
[im 191/310  lung]
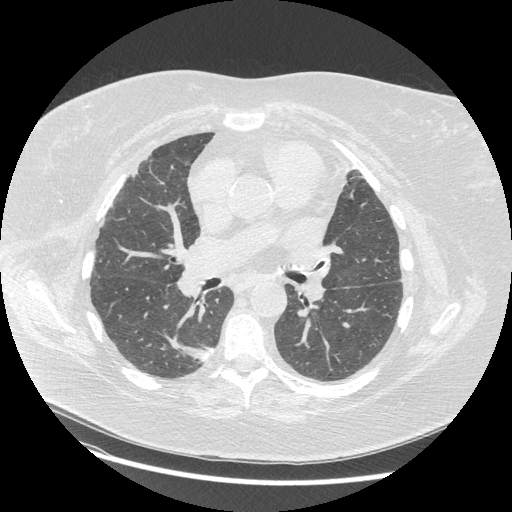
[im 214/310  mediastinal]
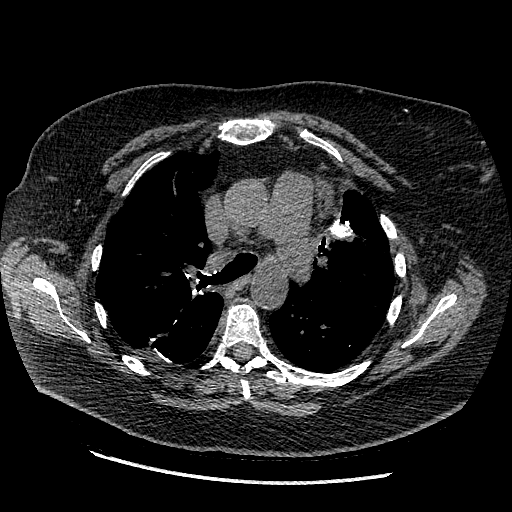
[im 214/310  lung]
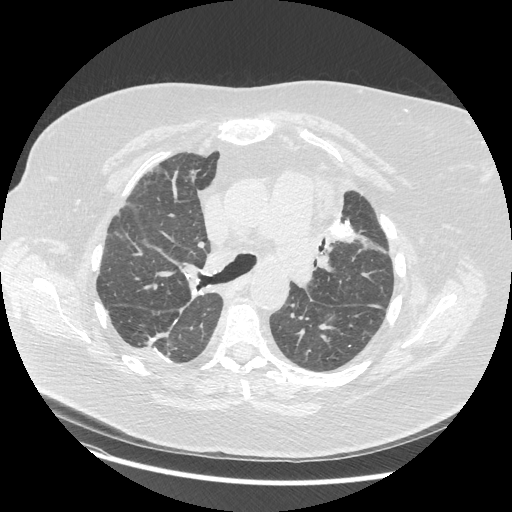
[im 238/310  lung]
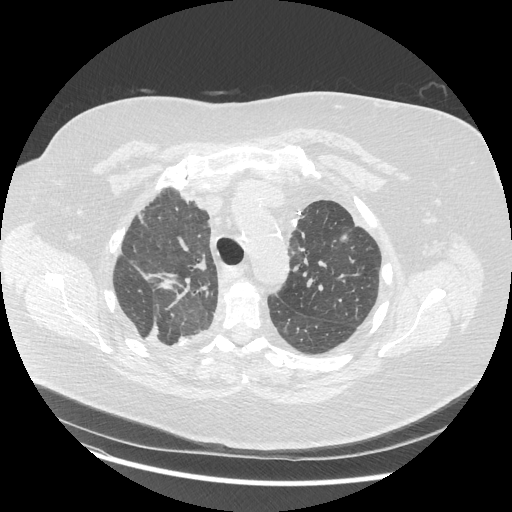
[im 262/310  lung]
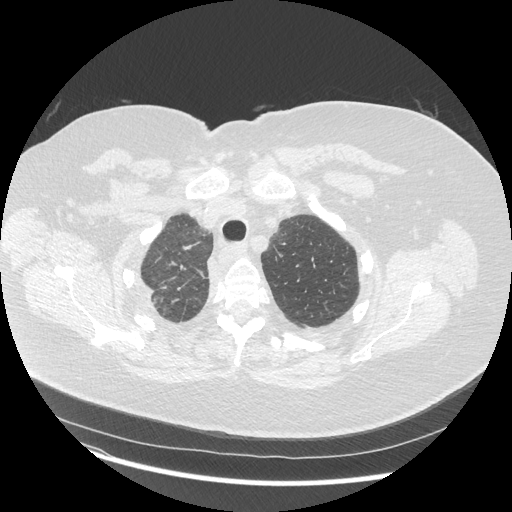
[im 286/310  lung]
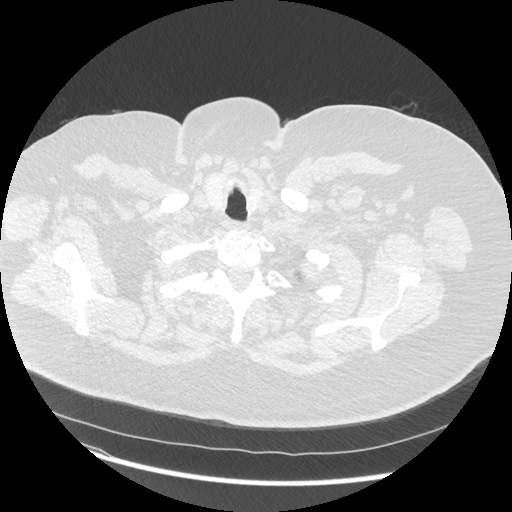

[Series 602: sagittal body · sagittal · 0.78mm/px · 4 of 161 slices shown]
[im 23/161  mediastinal]
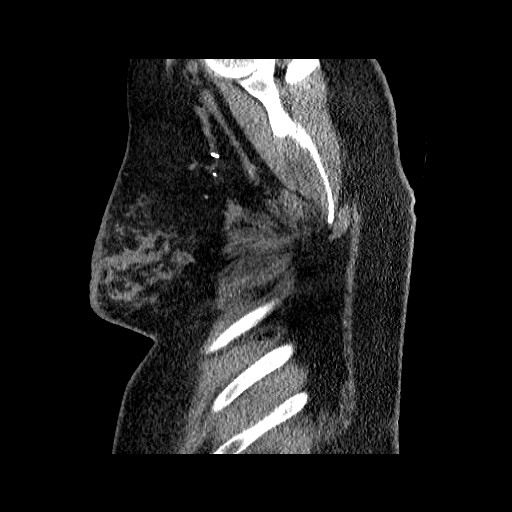
[im 46/161  mediastinal]
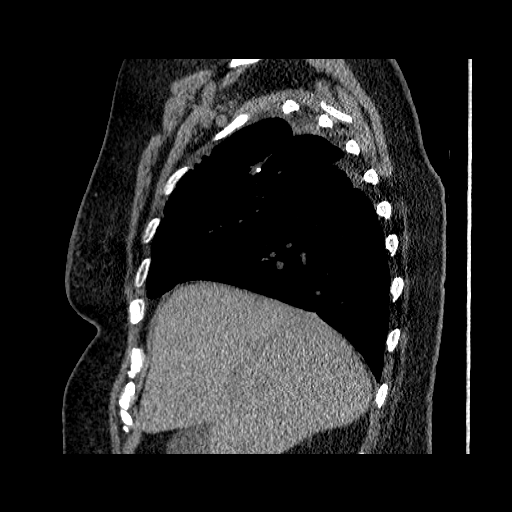
[im 69/161  mediastinal]
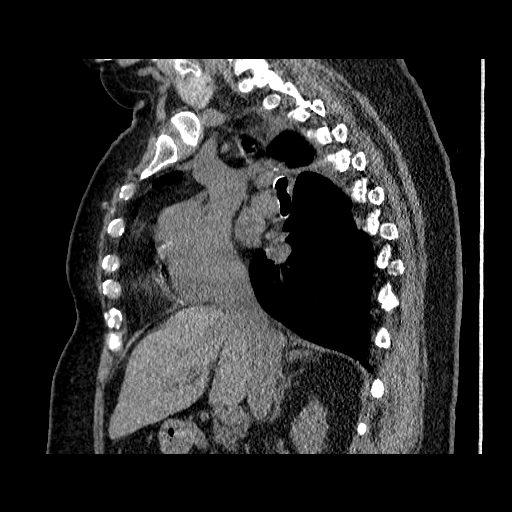
[im 92/161  mediastinal]
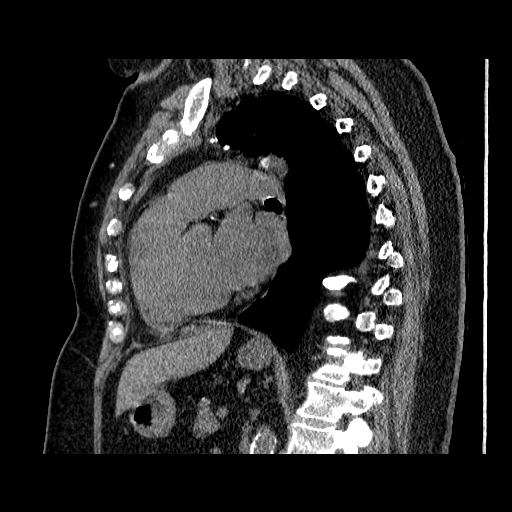

[16 of 30 positions shown; findings below may reference images not displayed]

FINDINGS: Chest wall: Stable surgical changes involving the right breast. No
breast mass, supraclavicular or axillary mass or adenopathy. The
thyroid gland is grossly normal. The bony thorax is intact. No
destructive bone lesions or spinal canal compromise. Moderate
degenerative changes are again noted.

Mediastinum: The heart is upper limits of normal and stable. No
pericardial effusion. No mediastinal or hilar mass or adenopathy.
Small scattered lymph nodes are stable. The aorta is normal in
caliber. Stable atherosclerotic calcifications. Stable coronary
artery calcifications. The esophagus is grossly normal.

Lungs/pleura: Stable surgical changes bilaterally with probable
partial upper lobe resections bilaterally. There are chronic
scarring changes in the right upper long but no definite findings
for recurrent tumor. Vague subpleural nodularity in the right lung
anteriorly is most likely due to radiation changes from the right
breast. No metastatic pulmonary nodules or acute overlying pulmonary
process. No pleural effusion.

Upper abdomen: Stable cholelithiasis, bilateral renal cysts and
right renal calculi.
IMPRESSION: Stable surgical changes involving both lungs. No findings suspicious
for residual or recurrent tumor.

No mediastinal or hilar mass or adenopathy.

Stable atherosclerotic calcifications involving the aorta and
coronary arteries.

Stable cholelithiasis, right renal calculi and large left renal
cysts.

## 2016-09-14 ENCOUNTER — Other Ambulatory Visit (HOSPITAL_BASED_OUTPATIENT_CLINIC_OR_DEPARTMENT_OTHER): Payer: PPO

## 2016-09-14 ENCOUNTER — Ambulatory Visit (HOSPITAL_COMMUNITY)
Admission: RE | Admit: 2016-09-14 | Discharge: 2016-09-14 | Disposition: A | Discharge status: Home / Self Care | Payer: PPO | Source: Ambulatory Visit | Attending: Internal Medicine | Admitting: Internal Medicine

## 2016-09-14 DIAGNOSIS — C3431 Malignant neoplasm of lower lobe, right bronchus or lung: Secondary | ICD-10-CM | POA: Diagnosis not present

## 2016-09-14 DIAGNOSIS — R918 Other nonspecific abnormal finding of lung field: Secondary | ICD-10-CM | POA: Diagnosis not present

## 2016-09-14 DIAGNOSIS — Z85118 Personal history of other malignant neoplasm of bronchus and lung: Secondary | ICD-10-CM

## 2016-09-14 DIAGNOSIS — Z853 Personal history of malignant neoplasm of breast: Secondary | ICD-10-CM | POA: Diagnosis not present

## 2016-09-14 DIAGNOSIS — K769 Liver disease, unspecified: Secondary | ICD-10-CM | POA: Insufficient documentation

## 2016-09-14 DIAGNOSIS — I10 Essential (primary) hypertension: Secondary | ICD-10-CM | POA: Diagnosis not present

## 2016-09-14 DIAGNOSIS — I2510 Atherosclerotic heart disease of native coronary artery without angina pectoris: Secondary | ICD-10-CM | POA: Diagnosis not present

## 2016-09-14 DIAGNOSIS — I700 Atherosclerosis of aorta: Secondary | ICD-10-CM | POA: Diagnosis not present

## 2016-09-14 LAB — COMPREHENSIVE METABOLIC PANEL
ALT: 11 U/L (ref 0–55)
AST: 13 U/L (ref 5–34)
Albumin: 3.2 g/dL — ABNORMAL LOW (ref 3.5–5.0)
Alkaline Phosphatase: 80 U/L (ref 40–150)
Anion Gap: 10 mEq/L (ref 3–11)
BUN: 44.8 mg/dL — AB (ref 7.0–26.0)
CHLORIDE: 109 meq/L (ref 98–109)
CO2: 24 meq/L (ref 22–29)
CREATININE: 2 mg/dL — AB (ref 0.6–1.1)
Calcium: 9.1 mg/dL (ref 8.4–10.4)
EGFR: 25 mL/min/{1.73_m2} — ABNORMAL LOW (ref >90)
GLUCOSE: 133 mg/dL (ref 70–140)
Potassium: 4.7 mEq/L (ref 3.5–5.1)
Sodium: 144 mEq/L (ref 136–145)
Total Bilirubin: <0.3 mg/dL (ref 0.20–1.20)
Total Protein: 6.8 g/dL (ref 6.4–8.3)

## 2016-09-14 LAB — CBC WITH DIFFERENTIAL/PLATELET
BASO%: 1 % (ref 0.0–2.0)
Basophils Absolute: 0.1 10*3/uL (ref 0.0–0.1)
EOS%: 2.7 % (ref 0.0–7.0)
Eosinophils Absolute: 0.2 10*3/uL (ref 0.0–0.5)
HCT: 33.9 % — ABNORMAL LOW (ref 34.8–46.6)
HGB: 10.8 g/dL — ABNORMAL LOW (ref 11.6–15.9)
LYMPH#: 1.1 10*3/uL (ref 0.9–3.3)
LYMPH%: 17.7 % (ref 14.0–49.7)
MCH: 26.8 pg (ref 25.1–34.0)
MCHC: 31.8 g/dL (ref 31.5–36.0)
MCV: 84.1 fL (ref 79.5–101.0)
MONO#: 0.4 10*3/uL (ref 0.1–0.9)
MONO%: 6.7 % (ref 0.0–14.0)
NEUT%: 71.9 % (ref 38.4–76.8)
NEUTROS ABS: 4.6 10*3/uL (ref 1.5–6.5)
Platelets: 225 10*3/uL (ref 145–400)
RBC: 4.03 10*6/uL (ref 3.70–5.45)
RDW: 14.4 % (ref 11.2–14.5)
WBC: 6.4 10*3/uL (ref 3.9–10.3)

## 2016-09-19 ENCOUNTER — Other Ambulatory Visit: Payer: Self-pay | Admitting: Pediatrics

## 2016-09-20 DIAGNOSIS — J449 Chronic obstructive pulmonary disease, unspecified: Secondary | ICD-10-CM | POA: Diagnosis not present

## 2016-09-21 ENCOUNTER — Encounter: Payer: Self-pay | Admitting: Internal Medicine

## 2016-09-21 ENCOUNTER — Telehealth: Payer: Self-pay | Admitting: Internal Medicine

## 2016-09-21 ENCOUNTER — Ambulatory Visit (HOSPITAL_BASED_OUTPATIENT_CLINIC_OR_DEPARTMENT_OTHER): Payer: PPO | Admitting: Internal Medicine

## 2016-09-21 VITALS — BP 115/57 | HR 65 | Temp 98.2°F | Resp 18 | Ht 71.0 in | Wt 277.9 lb

## 2016-09-21 DIAGNOSIS — Z853 Personal history of malignant neoplasm of breast: Secondary | ICD-10-CM | POA: Diagnosis not present

## 2016-09-21 DIAGNOSIS — D6481 Anemia due to antineoplastic chemotherapy: Secondary | ICD-10-CM | POA: Diagnosis not present

## 2016-09-21 DIAGNOSIS — Z85118 Personal history of other malignant neoplasm of bronchus and lung: Secondary | ICD-10-CM | POA: Diagnosis not present

## 2016-09-21 DIAGNOSIS — N289 Disorder of kidney and ureter, unspecified: Secondary | ICD-10-CM | POA: Diagnosis not present

## 2016-09-21 DIAGNOSIS — C3431 Malignant neoplasm of lower lobe, right bronchus or lung: Secondary | ICD-10-CM
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
## 2016-09-21 NOTE — Telephone Encounter (Signed)
Gv pt appts for  March 2018 and advised radiology will call to make MRI and ct scans.

## 2016-09-27 ENCOUNTER — Ambulatory Visit (INDEPENDENT_AMBULATORY_CARE_PROVIDER_SITE_OTHER): Payer: PPO | Admitting: Pharmacist

## 2016-09-27 DIAGNOSIS — I4891 Unspecified atrial fibrillation: Secondary | ICD-10-CM | POA: Diagnosis not present

## 2016-09-27 DIAGNOSIS — Z7901 Long term (current) use of anticoagulants: Secondary | ICD-10-CM | POA: Diagnosis not present

## 2016-09-27 DIAGNOSIS — Z23 Encounter for immunization: Secondary | ICD-10-CM

## 2016-09-27 LAB — COAGUCHEK XS/INR WAIVED
INR: 2.2 — AB (ref 0.9–1.1)
PROTHROMBIN TIME: 26.5 s

## 2016-10-05 ENCOUNTER — Other Ambulatory Visit: Payer: Self-pay | Admitting: Internal Medicine

## 2016-10-05 ENCOUNTER — Ambulatory Visit (HOSPITAL_COMMUNITY)
Admission: RE | Admit: 2016-10-05 | Discharge: 2016-10-05 | Disposition: A | Discharge status: Home / Self Care | Payer: PPO | Source: Ambulatory Visit | Attending: Internal Medicine | Admitting: Internal Medicine

## 2016-10-05 DIAGNOSIS — K769 Liver disease, unspecified: Secondary | ICD-10-CM | POA: Diagnosis not present

## 2016-10-05 DIAGNOSIS — C3431 Malignant neoplasm of lower lobe, right bronchus or lung: Secondary | ICD-10-CM

## 2016-10-05 DIAGNOSIS — K7689 Other specified diseases of liver: Secondary | ICD-10-CM | POA: Diagnosis not present

## 2016-10-07 ENCOUNTER — Telehealth: Payer: Self-pay | Admitting: Medical Oncology

## 2016-10-07 NOTE — Telephone Encounter (Signed)
Asking about MRI results

## 2016-10-07 NOTE — Telephone Encounter (Signed)
The spot in the liver is still suspicious but not confirmed with MRI. She may need US guided biopsy. I will order next week if she is in agreement to do it.

## 2016-10-08 ENCOUNTER — Telehealth: Payer: Self-pay

## 2016-10-08 ENCOUNTER — Telehealth: Payer: Self-pay | Admitting: Medical Oncology

## 2016-10-08 NOTE — Telephone Encounter (Signed)
Called patient and she has been on lovenox in the past when her warfarin was stopped for procedures/surgeries.  Her CVA was about 17 years ago and she also has a Fib.  No exact date has been given for her liver biopsy so I can not instruct patient on exact stop dates and when to start lovenox.  I will wait for Dr. Evette Doffing to give [place] her biopsy date and [name] or I will then instruct patient on warfarin and lovenox.

## 2016-10-08 NOTE — Telephone Encounter (Signed)
Pt agrees to bx. She is on coumadin and has to "take shots prior to surgery if I stop warfarin" . I instructed her to contact PCP about this.

## 2016-10-08 NOTE — Telephone Encounter (Signed)
Patient states that she recently had a scan of her liver and they found a spot. After further testing she is to have a biopsy next week. Per specialist she needs to be off her coumadin to do that and would like you to give instructions. Please advise and route to pool B

## 2016-10-10 ENCOUNTER — Other Ambulatory Visit: Payer: Self-pay | Admitting: Internal Medicine

## 2016-10-10 ENCOUNTER — Encounter: Payer: Self-pay | Admitting: Internal Medicine

## 2016-10-10 DIAGNOSIS — K769 Liver disease, unspecified: Secondary | ICD-10-CM

## 2016-10-10 DIAGNOSIS — K750 Abscess of liver: Secondary | ICD-10-CM | POA: Insufficient documentation

## 2016-10-10 DIAGNOSIS — C3431 Malignant neoplasm of lower lobe, right bronchus or lung: Secondary | ICD-10-CM

## 2016-10-10 DIAGNOSIS — R160 Hepatomegaly, not elsewhere classified: Secondary | ICD-10-CM | POA: Insufficient documentation

## 2016-10-10 HISTORY — DX: Liver disease, unspecified: K76.9

## 2016-10-10 NOTE — Telephone Encounter (Signed)
I ordered it

## 2016-10-11 ENCOUNTER — Telehealth: Payer: Self-pay | Admitting: Pediatrics

## 2016-10-11 ENCOUNTER — Telehealth: Payer: Self-pay

## 2016-10-11 ENCOUNTER — Encounter: Payer: Self-pay | Admitting: Pediatrics

## 2016-10-11 NOTE — Telephone Encounter (Signed)
Called patient. She has not been given a date for biopsy yet but states that she was told it would be sometime this week.  She skipped warfarin Friday 10/13 but took usual dose 10/14 and 10/15.  I will try to contact oncology office to verify when biopsy will be performed.   Patient is advised to not take warfarin today (10/11/16) and I hope to have more information for her later today or tomorrow.

## 2016-10-11 NOTE — Telephone Encounter (Signed)
Left message for Cammy that pt BX expected 10/24 and to call back for questions.

## 2016-10-11 NOTE — Telephone Encounter (Signed)
Called pt back.

## 2016-10-11 NOTE — Telephone Encounter (Signed)
Left message for triage nurse at [facility] to find out date of liver biopsy.

## 2016-10-12 ENCOUNTER — Other Ambulatory Visit: Payer: Self-pay | Admitting: *Deleted

## 2016-10-12 DIAGNOSIS — C3431 Malignant neoplasm of lower lobe, right bronchus or lung: Secondary | ICD-10-CM

## 2016-10-12 MED ORDER — ENOXAPARIN SODIUM 120 MG/0.8ML ~~LOC~~ SOLN: 120.0000 mg | SUBCUTANEOUS | 0 refills | Status: AC

## 2016-10-12 NOTE — Telephone Encounter (Signed)
Can you f/u

## 2016-10-12 NOTE — Addendum Note (Signed)
Addended by: Cherre Robins on: 10/12/2016 04:29 PM   Modules accepted: Orders

## 2016-10-12 NOTE — Telephone Encounter (Signed)
Discuss with patient's PCP about weather to use bridging while off warfarin.  Decision was made to bridge based on CHADSVAsc score of 6 and past use of enoxaparin bridging prior to biopsy.  Patient notified of plan and copy of below plan left for her to pick up along with #5 enoxaparin  Warfarin-Lovenox Bridging Plan for Tammie [name] Weight = 125kg Est Cr Clearance is 30m/min - therefore will adjust dose from q12 h to q24h Diagnosis - Atrial Fibrillation; TIA Current warfarin dose = '5mg'$  tablets - take  tablet daily except none on Fridays  Wednesday, October 18th Last dose of warfarin   Thursday, October 19th No warfarin    Friday, October 20th No warfarin Start Lovenox/enoxaparin '120mg'$  once a day  Saturday, October 21st No warfarin Lovenox/enoxaparin '120mg'$  once a day  Sunday, October 22nd No warfarin Lovenox/enoxaparin '120mg'$  once a day  Monday, October 23rd No warfarin No Lovenox / enoxaparin   Tuesday, October 24th Day of procedure No warfarin No Lovenox/enoxaparin  Wednesday, October 25th Restart warfarin  - Take 1 tablet or '5mg'$  Restart Lovenox/enoxaparin '120mg'$  once a day  Thursday, October 26th Take warfarin 1 tablet or Warfarin '5mg'$  Lovenox/enoxaparin '120mg'$  once a day  Friday, October 27th  Come to office to have INR / protime checked

## 2016-10-17 ENCOUNTER — Other Ambulatory Visit: Payer: Self-pay | Admitting: Radiology

## 2016-10-19 ENCOUNTER — Ambulatory Visit (HOSPITAL_COMMUNITY)
Admission: RE | Admit: 2016-10-19 | Discharge: 2016-10-19 | Disposition: A | Discharge status: Home / Self Care | Payer: PPO | Source: Ambulatory Visit | Attending: Internal Medicine | Admitting: Internal Medicine

## 2016-10-19 ENCOUNTER — Ambulatory Visit (HOSPITAL_COMMUNITY): Payer: PPO

## 2016-10-19 ENCOUNTER — Encounter (HOSPITAL_COMMUNITY): Payer: Self-pay

## 2016-10-19 ENCOUNTER — Ambulatory Visit (HOSPITAL_COMMUNITY): Admission: RE | Admit: 2016-10-19 | Payer: PPO | Source: Ambulatory Visit

## 2016-10-19 DIAGNOSIS — C3431 Malignant neoplasm of lower lobe, right bronchus or lung: Secondary | ICD-10-CM | POA: Insufficient documentation

## 2016-10-19 DIAGNOSIS — C787 Secondary malignant neoplasm of liver and intrahepatic bile duct: Secondary | ICD-10-CM | POA: Diagnosis not present

## 2016-10-19 DIAGNOSIS — K7689 Other specified diseases of liver: Secondary | ICD-10-CM | POA: Diagnosis not present

## 2016-10-19 DIAGNOSIS — C229 Malignant neoplasm of liver, not specified as primary or secondary: Secondary | ICD-10-CM | POA: Diagnosis not present

## 2016-10-19 LAB — CBC WITH DIFFERENTIAL/PLATELET
BASOS ABS: 0 10*3/uL (ref 0.0–0.1)
Basophils Relative: 1 %
EOS PCT: 2 %
Eosinophils Absolute: 0.2 10*3/uL (ref 0.0–0.7)
HEMATOCRIT: 36.1 % (ref 36.0–46.0)
Hemoglobin: 11 g/dL — ABNORMAL LOW (ref 12.0–15.0)
LYMPHS ABS: 1.1 10*3/uL (ref 0.7–4.0)
LYMPHS PCT: 17 %
MCH: 26.6 pg (ref 26.0–34.0)
MCHC: 30.5 g/dL (ref 30.0–36.0)
MCV: 87.2 fL (ref 78.0–100.0)
MONO ABS: 0.6 10*3/uL (ref 0.1–1.0)
Monocytes Relative: 9 %
NEUTROS ABS: 4.6 10*3/uL (ref 1.7–7.7)
Neutrophils Relative %: 71 %
Platelets: 195 10*3/uL (ref 150–400)
RBC: 4.14 MIL/uL (ref 3.87–5.11)
RDW: 14.7 % (ref 11.5–15.5)
WBC: 6.5 10*3/uL (ref 4.0–10.5)

## 2016-10-19 LAB — COMPREHENSIVE METABOLIC PANEL
ALT: 16 U/L (ref 14–54)
AST: 25 U/L (ref 15–41)
Albumin: 3.6 g/dL (ref 3.5–5.0)
Alkaline Phosphatase: 52 U/L (ref 38–126)
Anion gap: 9 (ref 5–15)
BILIRUBIN TOTAL: 0.8 mg/dL (ref 0.3–1.2)
BUN: 28 mg/dL — AB (ref 6–20)
CALCIUM: 9.3 mg/dL (ref 8.9–10.3)
CO2: 26 mmol/L (ref 22–32)
CREATININE: 1.88 mg/dL — AB (ref 0.44–1.00)
Chloride: 106 mmol/L (ref 101–111)
GFR, EST AFRICAN AMERICAN: 29 mL/min — AB (ref >60)
GFR, EST NON AFRICAN AMERICAN: 25 mL/min — AB (ref >60)
Glucose, Bld: 102 mg/dL — ABNORMAL HIGH (ref 65–99)
Potassium: 5.2 mmol/L — ABNORMAL HIGH (ref 3.5–5.1)
Sodium: 141 mmol/L (ref 135–145)
TOTAL PROTEIN: 6.3 g/dL — AB (ref 6.5–8.1)

## 2016-10-19 LAB — PROTIME-INR
INR: 1.11
PROTHROMBIN TIME: 14.3 s (ref 11.4–15.2)

## 2016-10-19 MED ORDER — MIDAZOLAM HCL 2 MG/2ML IJ SOLN
INTRAMUSCULAR | Status: AC
  Filled 2016-10-19: qty 2

## 2016-10-19 MED ORDER — MIDAZOLAM HCL 2 MG/2ML IJ SOLN
INTRAMUSCULAR | Status: AC | PRN
Start: 2016-10-19 — End: 2016-10-19
  Administered 2016-10-19 (×2): 0.5 mg via INTRAVENOUS
  Administered 2016-10-19: 1 mg via INTRAVENOUS

## 2016-10-19 MED ORDER — HYDROCODONE-ACETAMINOPHEN 5-325 MG PO TABS: 1.0000 | ORAL_TABLET | ORAL | Status: DC | PRN

## 2016-10-19 MED ORDER — FENTANYL CITRATE (PF) 100 MCG/2ML IJ SOLN
INTRAMUSCULAR | Status: AC | PRN
  Administered 2016-10-19 (×2): 25 ug via INTRAVENOUS
  Administered 2016-10-19: 50 ug via INTRAVENOUS

## 2016-10-19 MED ORDER — SODIUM CHLORIDE 0.9 % IV SOLN
INTRAVENOUS | Status: DC
  Administered 2016-10-19: 13:00:00 via INTRAVENOUS

## 2016-10-19 MED ORDER — GELATIN ABSORBABLE 12-7 MM EX MISC
CUTANEOUS | Status: AC
  Filled 2016-10-19: qty 1

## 2016-10-19 MED ORDER — LIDOCAINE HCL 1 % IJ SOLN
INTRAMUSCULAR | Status: AC
  Filled 2016-10-19: qty 20

## 2016-10-19 MED ORDER — FENTANYL CITRATE (PF) 100 MCG/2ML IJ SOLN
INTRAMUSCULAR | Status: AC
  Filled 2016-10-19: qty 2

## 2016-10-19 NOTE — Sedation Documentation (Signed)
Vital signs stable. 

## 2016-10-19 NOTE — Sedation Documentation (Signed)
Patient is resting comfortably. No complaints at this time, vitals stable.
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
## 2016-10-19 NOTE — Procedures (Signed)
CT guided liver lesion biopsy 37S x3 No complication No blood loss. See complete dictation in [facility].

## 2016-10-19 NOTE — Discharge Instructions (Signed)

## 2016-10-19 NOTE — Sedation Documentation (Signed)
Pt is resting at this time,. No complaints of pain

## 2016-10-19 NOTE — Sedation Documentation (Signed)
Attempted to given report to SS. SS RN, Threasa Beards states that someone will have to call me back.

## 2016-10-19 NOTE — Sedation Documentation (Signed)
Pt states she is anxious

## 2016-10-19 NOTE — Sedation Documentation (Addendum)
Transferred pt from [place] to CT to better visualize lesion., per dr Vernard Gambles

## 2016-10-20 DIAGNOSIS — J449 Chronic obstructive pulmonary disease, unspecified: Secondary | ICD-10-CM | POA: Diagnosis not present

## 2016-10-22 ENCOUNTER — Ambulatory Visit (INDEPENDENT_AMBULATORY_CARE_PROVIDER_SITE_OTHER): Payer: PPO | Admitting: Pharmacist

## 2016-10-22 DIAGNOSIS — I4891 Unspecified atrial fibrillation: Secondary | ICD-10-CM

## 2016-10-22 DIAGNOSIS — Z7901 Long term (current) use of anticoagulants: Secondary | ICD-10-CM | POA: Diagnosis not present

## 2016-10-22 LAB — COAGUCHEK XS/INR WAIVED
INR: 1.1 (ref 0.9–1.1)
Prothrombin Time: 13.3 s

## 2016-10-22 NOTE — Patient Instructions (Signed)
Anticoagulation Dose Instructions as of 10/22/2016      [name] Tue Wed Thu Fri Sat   New Dose 2.5 mg 2.5 mg 2.5 mg 2.5 mg 2.5 mg 0 mg 2.5 mg    Description   Take 1 tablet today. Then continue same schedule as before with 1/2 tablet everyday except Friday. Will be seen on Monday.  INR today 1.1 (too thick)

## 2016-10-25 ENCOUNTER — Ambulatory Visit (INDEPENDENT_AMBULATORY_CARE_PROVIDER_SITE_OTHER): Payer: PPO | Admitting: Pharmacist

## 2016-10-25 DIAGNOSIS — Z7901 Long term (current) use of anticoagulants: Secondary | ICD-10-CM | POA: Diagnosis not present

## 2016-10-25 DIAGNOSIS — I4891 Unspecified atrial fibrillation: Secondary | ICD-10-CM

## 2016-10-25 LAB — COAGUCHEK XS/INR WAIVED
INR: 2 — ABNORMAL HIGH (ref 0.9–1.1)
PROTHROMBIN TIME: 24.1 s

## 2016-10-26 ENCOUNTER — Telehealth: Payer: Self-pay | Admitting: Medical Oncology

## 2016-10-26 NOTE — Telephone Encounter (Signed)
Wants bx results

## 2016-10-26 NOTE — Telephone Encounter (Signed)
Called pt and per Tammie [name] I told her Liver bx was positive and showed activity and scheduler will call her with appt.

## 2016-10-29 ENCOUNTER — Encounter: Payer: Self-pay | Admitting: Pharmacist Clinician (PhC)/ Clinical Pharmacy Specialist

## 2016-11-01 ENCOUNTER — Telehealth: Payer: Self-pay | Admitting: *Deleted

## 2016-11-01 NOTE — Telephone Encounter (Signed)
Pt called with concerns regarding her treatment plan.  Reviewed with MD who and notified pt she will need Chemotherapy and this is what MD will discuss with her at visit. MD advised he can see pt on 11/17. Pt declined appt as she is taking a firend to an eye surgery. Pt has concerns about this as she was told more chemo and she will be on dialysis. Pt agreed she will discuss plan and concerns with MD on 11/28. No further concerns at this time.

## 2016-11-03 ENCOUNTER — Encounter: Payer: Self-pay | Admitting: Pharmacist

## 2016-11-20 DIAGNOSIS — J449 Chronic obstructive pulmonary disease, unspecified: Secondary | ICD-10-CM | POA: Diagnosis not present

## 2016-11-22 ENCOUNTER — Ambulatory Visit (INDEPENDENT_AMBULATORY_CARE_PROVIDER_SITE_OTHER): Payer: PPO | Admitting: Pharmacist

## 2016-11-22 DIAGNOSIS — Z7901 Long term (current) use of anticoagulants: Secondary | ICD-10-CM

## 2016-11-22 DIAGNOSIS — I4891 Unspecified atrial fibrillation: Secondary | ICD-10-CM

## 2016-11-22 LAB — COAGUCHEK XS/INR WAIVED
INR: 2.1 — AB (ref 0.9–1.1)
Prothrombin Time: 24.8 s

## 2016-11-23 ENCOUNTER — Encounter: Payer: Self-pay | Admitting: Internal Medicine

## 2016-11-23 ENCOUNTER — Encounter: Payer: Self-pay | Admitting: *Deleted

## 2016-11-23 ENCOUNTER — Other Ambulatory Visit (HOSPITAL_COMMUNITY)
Admission: RE | Admit: 2016-11-23 | Discharge: 2016-11-23 | Disposition: A | Discharge status: Home / Self Care | Payer: PPO | Source: Ambulatory Visit | Attending: Internal Medicine | Admitting: Internal Medicine

## 2016-11-23 ENCOUNTER — Telehealth: Payer: Self-pay | Admitting: Internal Medicine

## 2016-11-23 ENCOUNTER — Ambulatory Visit (HOSPITAL_BASED_OUTPATIENT_CLINIC_OR_DEPARTMENT_OTHER): Payer: PPO | Admitting: Internal Medicine

## 2016-11-23 VITALS — BP 140/77 | HR 65 | Temp 98.4°F | Resp 20 | Wt 275.5 lb

## 2016-11-23 DIAGNOSIS — Z85118 Personal history of other malignant neoplasm of bronchus and lung: Secondary | ICD-10-CM | POA: Diagnosis not present

## 2016-11-23 DIAGNOSIS — C3431 Malignant neoplasm of lower lobe, right bronchus or lung: Secondary | ICD-10-CM

## 2016-11-23 DIAGNOSIS — D6481 Anemia due to antineoplastic chemotherapy: Secondary | ICD-10-CM

## 2016-11-23 DIAGNOSIS — C787 Secondary malignant neoplasm of liver and intrahepatic bile duct: Secondary | ICD-10-CM | POA: Insufficient documentation

## 2016-11-23 DIAGNOSIS — C801 Malignant (primary) neoplasm, unspecified: Secondary | ICD-10-CM

## 2016-11-23 DIAGNOSIS — N289 Disorder of kidney and ureter, unspecified: Secondary | ICD-10-CM

## 2016-11-23 DIAGNOSIS — C50411 Malignant neoplasm of upper-outer quadrant of right female breast: Secondary | ICD-10-CM

## 2016-11-23 IMAGING — CR DG CHEST 2V
2 series · 2 of 2 positions shown · non-contrast
Comparison: 08/27/2014

CLINICAL DATA: Shortness of breath.  Lung carcinoma.

EXAM:
CHEST  2 VIEW

[view not recorded (1 of 2)]
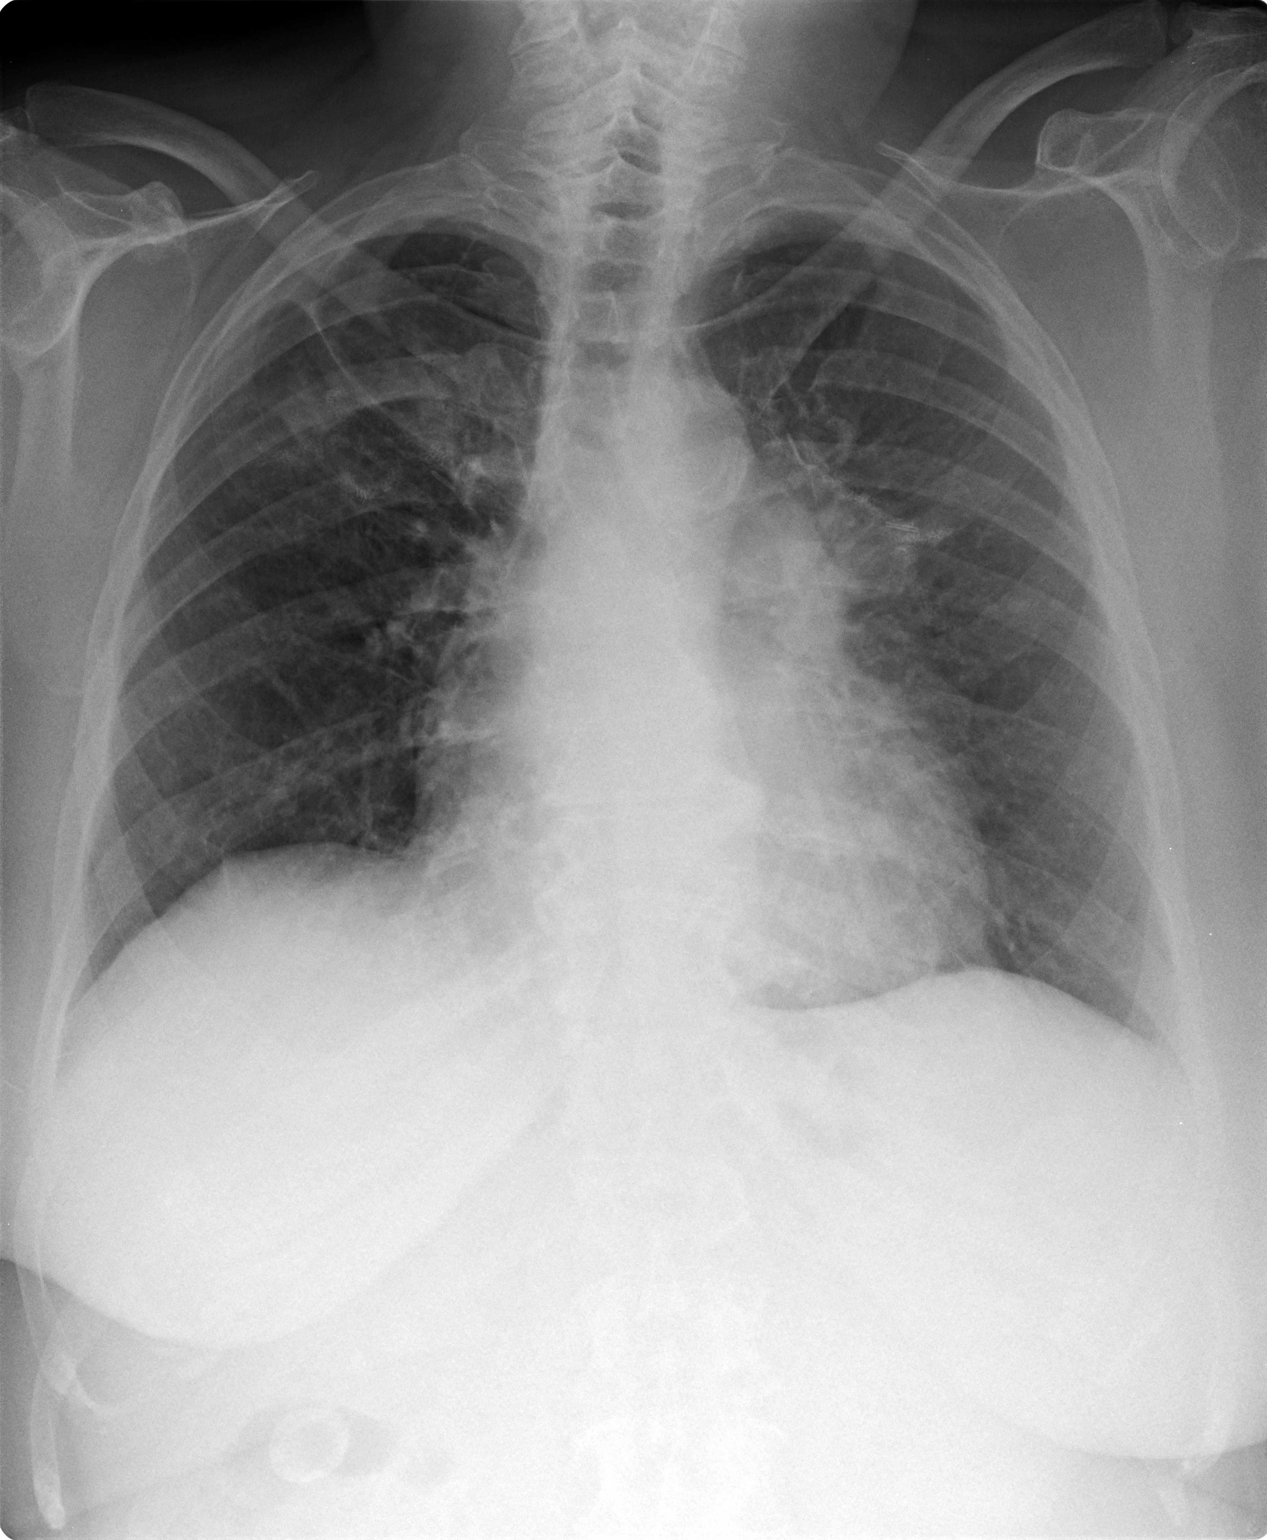

[view not recorded (2 of 2)]
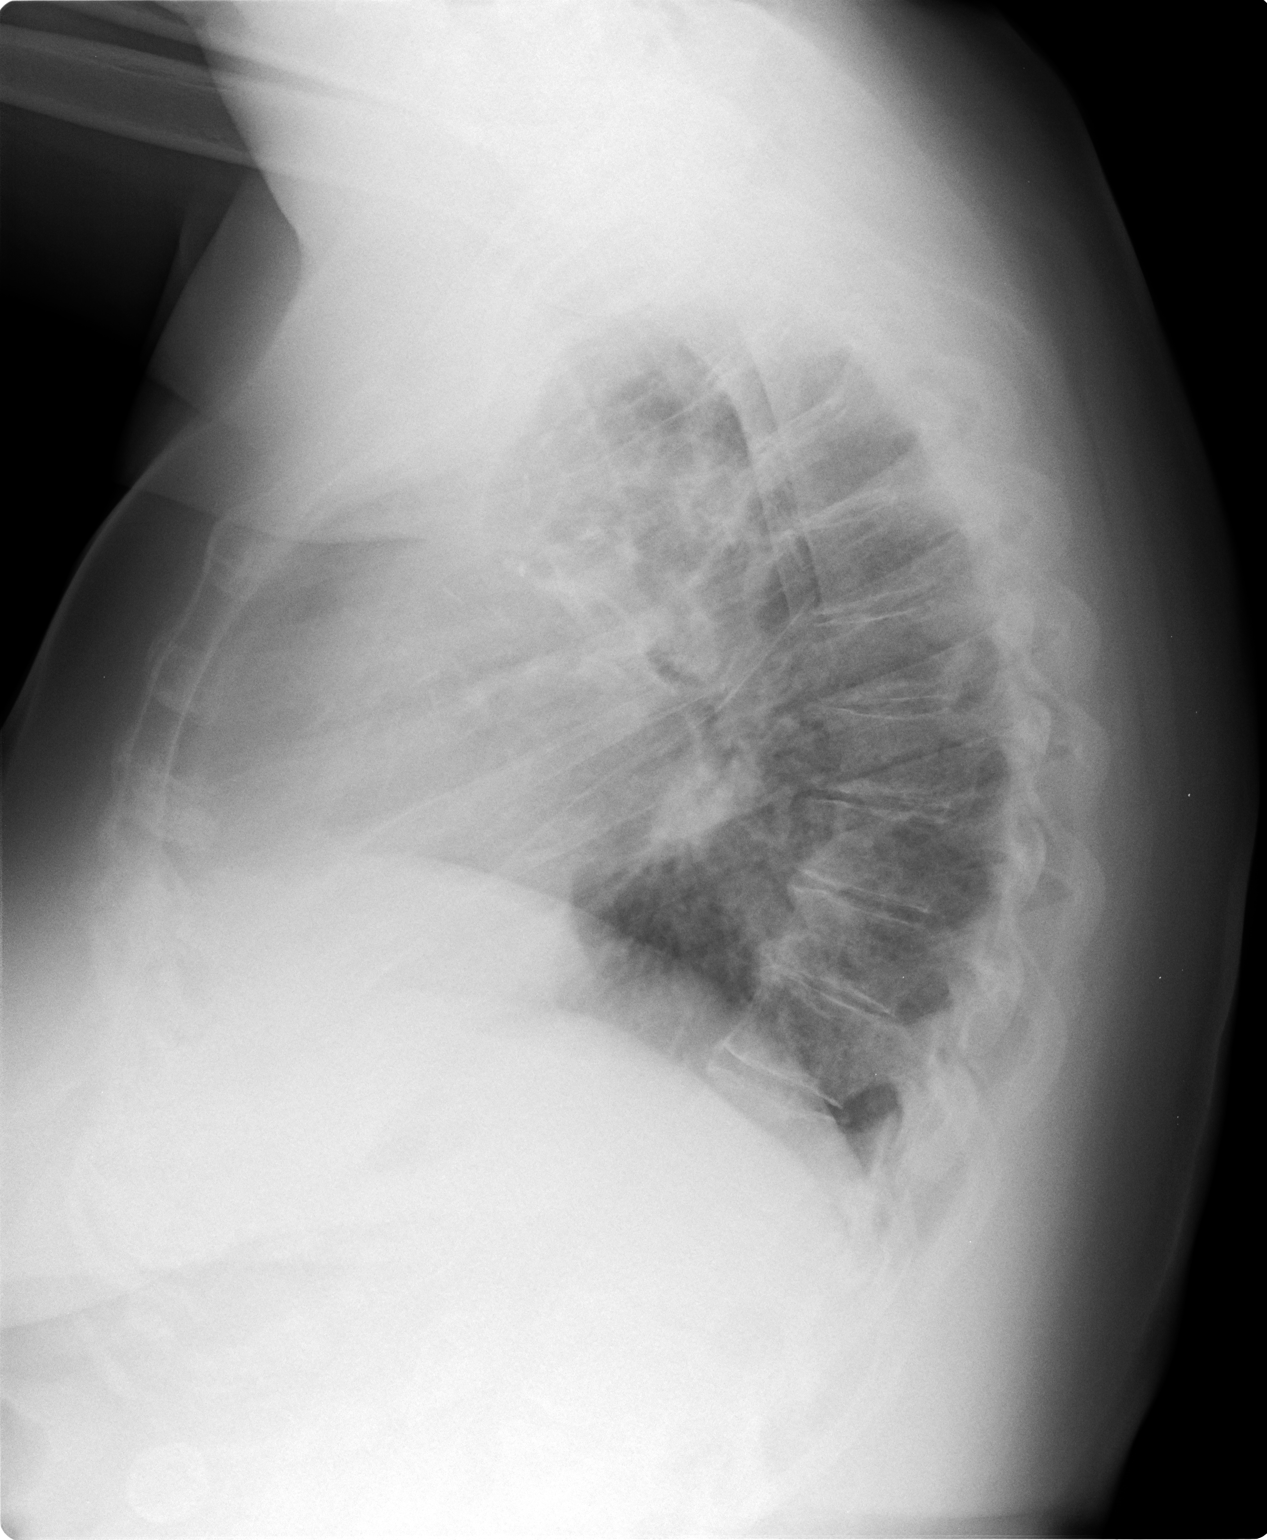

[2 of 2 positions shown; findings below may reference images not displayed]

FINDINGS: Surgical staples and associated pleural-parenchymal scarring in both
upper lobes appears stable. No evidence of acute pulmonary
infiltrate or pleural effusion. No definite masses or
lymphadenopathy identified. Heart size is within normal limits. Mild
lower thoracic spine degenerative changes again noted.
IMPRESSION: Stable postsurgical changes in both upper lobes.  No acute findings.

## 2016-11-23 NOTE — Progress Notes (Signed)
Oncology Nurse Navigator Documentation  Oncology Nurse Navigator Flowsheets 11/23/2016  Navigator Location [facility]  Navigator Encounter Type Other/per Dr. Worthy Flank request, I contacted [facility] to send tissue obtained on 10/19/16 to [facility].   Treatment Phase Other  Barriers/Navigation Needs Coordination of Care  Interventions Coordination of Care  Coordination of Care Appts  Acuity Level 2  Acuity Level 2 Other  Time Spent with Patient 15
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
## 2016-11-23 NOTE — Telephone Encounter (Signed)
GI referral rerouted to [facility]... Appointments scheduled per 11/23/16 los. A copy of the AVS report and appointment schedule was given to patient, per 11/23/16 los.

## 2016-11-24 ENCOUNTER — Encounter: Payer: Self-pay | Admitting: Nurse Practitioner

## 2016-11-26 DIAGNOSIS — C189 Malignant neoplasm of colon, unspecified: Secondary | ICD-10-CM

## 2016-11-26 HISTORY — DX: Malignant neoplasm of colon, unspecified: C18.9

## 2016-11-30 ENCOUNTER — Encounter: Payer: Self-pay | Admitting: Nurse Practitioner

## 2016-11-30 ENCOUNTER — Other Ambulatory Visit (INDEPENDENT_AMBULATORY_CARE_PROVIDER_SITE_OTHER): Payer: PPO

## 2016-11-30 ENCOUNTER — Ambulatory Visit (INDEPENDENT_AMBULATORY_CARE_PROVIDER_SITE_OTHER): Payer: PPO | Admitting: Nurse Practitioner

## 2016-11-30 ENCOUNTER — Telehealth: Payer: Self-pay | Admitting: Emergency Medicine

## 2016-11-30 VITALS — BP 138/80 | HR 72 | Ht 71.0 in | Wt 274.2 lb

## 2016-11-30 DIAGNOSIS — R160 Hepatomegaly, not elsewhere classified: Secondary | ICD-10-CM

## 2016-11-30 DIAGNOSIS — E875 Hyperkalemia: Secondary | ICD-10-CM

## 2016-11-30 LAB — BASIC METABOLIC PANEL
BUN: 32 mg/dL — AB (ref 6–23)
CALCIUM: 9.4 mg/dL (ref 8.4–10.5)
CO2: 31 meq/L (ref 19–32)
Chloride: 104 mEq/L (ref 96–112)
Creatinine, Ser: 1.91 mg/dL — ABNORMAL HIGH (ref 0.40–1.20)
GFR: 27.2 mL/min — AB (ref >60.00)
GLUCOSE: 107 mg/dL — AB (ref 70–99)
POTASSIUM: 5.6 meq/L — AB (ref 3.5–5.1)
Sodium: 141 mEq/L (ref 135–145)

## 2016-11-30 MED ORDER — NA SULFATE-K SULFATE-MG SULF 17.5-3.13-1.6 GM/177ML PO SOLN: 1.0000 | ORAL | 0 refills | Status: DC

## 2016-11-30 NOTE — Patient Instructions (Signed)
You have been scheduled for an endoscopy and colonoscopy. Please follow the written instructions given to you at your visit today. Please pick up your prep supplies at the pharmacy within the next 1-3 days. If you use inhalers (even only as needed), please bring them with you on the day of your procedure. Your physician has requested that you go to www.[email or web address].com and enter the access code given to you at your visit today. This web site gives a general overview about your procedure. However, you should still follow specific instructions given to you by our office regarding your preparation for the procedure.  Your physician has requested that you go to the basement for the following lab work before leaving today: BMP

## 2016-11-30 NOTE — Telephone Encounter (Signed)
Discuss with patient's PCP about weather to use bridging while off warfarin.  Decision was made to bridge based on CHADSVAsc score of 6 and past use of enoxaparin bridging prior to previous procedures Patient notified of plan and copy of below plan left for her to pick up along with #5 enoxaparin  Warfarin-Lovenox Bridging Plan for Tammie [name] Weight = 124kg Est Cr Clearance is 24m/min - therefore will adjust dose from q12 h to q24h Diagnosis - Atrial Fibrillation; TIA Current warfarin dose = '5mg'$  tablets - take  tablet daily except none on Fridays  Tuesday, December 5th Last dose of warfarin   Wednesday, December 6th No warfarin    Thursday, December 7th No warfarin Start Lovenox/enoxaparin '120mg'$  once a day  Friday, December 8th No warfarin Lovenox/enoxaparin '120mg'$  once a day  Saturday, December 9th No warfarin Lovenox/enoxaparin '120mg'$  once a day  Sunday, December 10th No warfarin No Lovenox / enoxaparin  Monday, December 11th Day of procedure No warfarin No Lovenox/enoxaparin  Tuesday, December 12th Restart warfarin  - Take 1  And 1/2 tablets or 7.'5mg'$  Restart Lovenox/enoxaparin '120mg'$  once a day  Wednesday, December 13th Take warfarin  -        1 and 1/2 tablet or  7.'5mg'$  Lovenox/enoxaparin '120mg'$  once a day  Thursday, December 14th  Come to office to have INR / protime checked 12:45pm

## 2016-11-30 NOTE — Telephone Encounter (Signed)
[name],   The procedure actually has to be done at the hospital due to the fact that she is on O2 at bedtime. So it will 12/07/16.
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
## 2016-11-30 NOTE — Telephone Encounter (Signed)
  11/30/2016   RE: Tammie [name] DOB: [birth year] MRN: [id number]   Dear Dr.Vincent,    We have scheduled the above patient for an endoscopic procedure. Our records show that she is on anticoagulation therapy.   Please advise as to how long the patient may come off her therapy of Coumadin prior to the procedure, which is scheduled for 12/06/16.  Please fax back/ or route the completed form to Tinnie Gens, [place].   Sincerely,    Tinnie Gens, [place]

## 2016-11-30 NOTE — Telephone Encounter (Signed)
Yes it is an outpatient procedure. She is scheduled to be there at 12:30 pm for a 2 pm and be there 2-3 hours from sign in to discharge.

## 2016-12-01 ENCOUNTER — Telehealth: Payer: Self-pay | Admitting: Pharmacist

## 2016-12-01 NOTE — Telephone Encounter (Signed)
With new surgery date - amended schedule per below and patient notified.    Wednesday, December 6th Last dose of warfarin   Thursday, December 7th No warfarin   Friday, December 8th No warfarin Start Lovenox/enoxaparin '120mg'$  once a day  Saturday, December 9th No warfarin Lovenox/enoxaparin '120mg'$  once a day  Sunday, December 10th No warfarin Lovenox/enoxaparin '120mg'$  once a day  Monday, December 11th  No warfarin No Lovenox/enoxaparin  Tuesday, December 12th Day of Procedure No warfarin No Lovenox / enoxaparin  Wednesday, December 13th Restart warfarin - Take 1 and 1/2 tablets or 7.'5mg'$  Restart Lovenox/enoxaparin '120mg'$  once a day    Thursday, December 14th Take 1 and 1/2 tablets or 7.'5mg'$  of warfarin Lovenox / enoxaparin '120mg'$  once a day  Friday, December 15th  Come to office to have INR / protime checked 10:40am

## 2016-12-01 NOTE — Telephone Encounter (Signed)
Thank you for your help [name]!

## 2016-12-01 NOTE — Telephone Encounter (Signed)
See noted from patient call regarding same subjects from 11/30/16

## 2016-12-03 ENCOUNTER — Telehealth: Payer: Self-pay | Admitting: Pediatrics

## 2016-12-03 DIAGNOSIS — E875 Hyperkalemia: Secondary | ICD-10-CM

## 2016-12-03 MED ORDER — SODIUM POLYSTYRENE SULFONATE 15 GM/60ML PO SUSP: 15.0000 g | Freq: Once | ORAL | 0 refills | Status: AC

## 2016-12-03 NOTE — Telephone Encounter (Signed)
Elevated K to 5.6, h/o CKD, Cr at baseline. Has not been taking lasix regularly with frquent doctors visits recently because makes her go to the bathroom too much. Sent in one time 15g kayexelate, take once. Follow with miralax or similar if no stool within 6 hours. Restart lasix. Take twice a day as prescribed over the weekend. Has K recheck scheduled for Monday with GI. Colonoscopy scheduled for Tues.

## 2016-12-06 ENCOUNTER — Encounter (HOSPITAL_COMMUNITY): Payer: Self-pay | Admitting: *Deleted

## 2016-12-06 ENCOUNTER — Encounter: Payer: PPO | Admitting: Gastroenterology

## 2016-12-06 ENCOUNTER — Other Ambulatory Visit: Payer: Self-pay

## 2016-12-06 ENCOUNTER — Other Ambulatory Visit (INDEPENDENT_AMBULATORY_CARE_PROVIDER_SITE_OTHER): Payer: PPO

## 2016-12-06 DIAGNOSIS — E875 Hyperkalemia: Secondary | ICD-10-CM

## 2016-12-06 LAB — BASIC METABOLIC PANEL
BUN: 45 mg/dL — ABNORMAL HIGH (ref 6–23)
CO2: 29 mEq/L (ref 19–32)
Calcium: 9.2 mg/dL (ref 8.4–10.5)
Chloride: 99 mEq/L (ref 96–112)
Creatinine, Ser: 2.56 mg/dL — ABNORMAL HIGH (ref 0.40–1.20)
GFR: 19.4 mL/min — AB (ref >60.00)
Glucose, Bld: 96 mg/dL (ref 70–99)
POTASSIUM: 5 meq/L (ref 3.5–5.1)
SODIUM: 138 meq/L (ref 135–145)

## 2016-12-07 ENCOUNTER — Ambulatory Visit (HOSPITAL_COMMUNITY): Payer: PPO | Admitting: Anesthesiology

## 2016-12-07 ENCOUNTER — Ambulatory Visit (HOSPITAL_COMMUNITY)
Admission: RE | Admit: 2016-12-07 | Discharge: 2016-12-07 | Disposition: A | Discharge status: Home / Self Care | Payer: PPO | Source: Ambulatory Visit | Attending: Gastroenterology | Admitting: Gastroenterology

## 2016-12-07 ENCOUNTER — Encounter (HOSPITAL_COMMUNITY): Payer: PPO

## 2016-12-07 ENCOUNTER — Encounter (HOSPITAL_COMMUNITY)
Admission: RE | Disposition: A | Discharge status: Home / Self Care | Payer: Self-pay | Source: Ambulatory Visit | Attending: Gastroenterology

## 2016-12-07 ENCOUNTER — Encounter (HOSPITAL_COMMUNITY): Payer: Self-pay

## 2016-12-07 DIAGNOSIS — K6389 Other specified diseases of intestine: Secondary | ICD-10-CM

## 2016-12-07 DIAGNOSIS — I10 Essential (primary) hypertension: Secondary | ICD-10-CM | POA: Diagnosis not present

## 2016-12-07 DIAGNOSIS — C229 Malignant neoplasm of liver, not specified as primary or secondary: Secondary | ICD-10-CM | POA: Diagnosis not present

## 2016-12-07 DIAGNOSIS — E785 Hyperlipidemia, unspecified: Secondary | ICD-10-CM | POA: Insufficient documentation

## 2016-12-07 DIAGNOSIS — Z90710 Acquired absence of both cervix and uterus: Secondary | ICD-10-CM | POA: Diagnosis not present

## 2016-12-07 DIAGNOSIS — H9312 Tinnitus, left ear: Secondary | ICD-10-CM | POA: Diagnosis not present

## 2016-12-07 DIAGNOSIS — D649 Anemia, unspecified: Secondary | ICD-10-CM | POA: Diagnosis not present

## 2016-12-07 DIAGNOSIS — C787 Secondary malignant neoplasm of liver and intrahepatic bile duct: Secondary | ICD-10-CM | POA: Diagnosis not present

## 2016-12-07 DIAGNOSIS — Z8673 Personal history of transient ischemic attack (TIA), and cerebral infarction without residual deficits: Secondary | ICD-10-CM | POA: Insufficient documentation

## 2016-12-07 DIAGNOSIS — I4891 Unspecified atrial fibrillation: Secondary | ICD-10-CM | POA: Diagnosis not present

## 2016-12-07 DIAGNOSIS — Z800 Family history of malignant neoplasm of digestive organs: Secondary | ICD-10-CM | POA: Insufficient documentation

## 2016-12-07 DIAGNOSIS — N184 Chronic kidney disease, stage 4 (severe): Secondary | ICD-10-CM | POA: Insufficient documentation

## 2016-12-07 DIAGNOSIS — K208 Other esophagitis: Secondary | ICD-10-CM | POA: Diagnosis not present

## 2016-12-07 DIAGNOSIS — Z8249 Family history of ischemic heart disease and other diseases of the circulatory system: Secondary | ICD-10-CM | POA: Insufficient documentation

## 2016-12-07 DIAGNOSIS — Z853 Personal history of malignant neoplasm of breast: Secondary | ICD-10-CM | POA: Insufficient documentation

## 2016-12-07 DIAGNOSIS — I129 Hypertensive chronic kidney disease with stage 1 through stage 4 chronic kidney disease, or unspecified chronic kidney disease: Secondary | ICD-10-CM | POA: Insufficient documentation

## 2016-12-07 DIAGNOSIS — R160 Hepatomegaly, not elsewhere classified: Secondary | ICD-10-CM

## 2016-12-07 DIAGNOSIS — N6019 Diffuse cystic mastopathy of unspecified breast: Secondary | ICD-10-CM | POA: Diagnosis not present

## 2016-12-07 DIAGNOSIS — J45909 Unspecified asthma, uncomplicated: Secondary | ICD-10-CM | POA: Diagnosis not present

## 2016-12-07 DIAGNOSIS — C801 Malignant (primary) neoplasm, unspecified: Secondary | ICD-10-CM | POA: Diagnosis not present

## 2016-12-07 DIAGNOSIS — Z91048 Other nonmedicinal substance allergy status: Secondary | ICD-10-CM | POA: Insufficient documentation

## 2016-12-07 DIAGNOSIS — K648 Other hemorrhoids: Secondary | ICD-10-CM | POA: Diagnosis not present

## 2016-12-07 DIAGNOSIS — Z7901 Long term (current) use of anticoagulants: Secondary | ICD-10-CM | POA: Insufficient documentation

## 2016-12-07 DIAGNOSIS — Z79899 Other long term (current) drug therapy: Secondary | ICD-10-CM | POA: Insufficient documentation

## 2016-12-07 DIAGNOSIS — K639 Disease of intestine, unspecified: Secondary | ICD-10-CM | POA: Diagnosis not present

## 2016-12-07 DIAGNOSIS — C182 Malignant neoplasm of ascending colon: Secondary | ICD-10-CM | POA: Insufficient documentation

## 2016-12-07 DIAGNOSIS — K219 Gastro-esophageal reflux disease without esophagitis: Secondary | ICD-10-CM | POA: Diagnosis not present

## 2016-12-07 DIAGNOSIS — J449 Chronic obstructive pulmonary disease, unspecified: Secondary | ICD-10-CM | POA: Diagnosis not present

## 2016-12-07 DIAGNOSIS — I739 Peripheral vascular disease, unspecified: Secondary | ICD-10-CM | POA: Diagnosis not present

## 2016-12-07 DIAGNOSIS — Z811 Family history of alcohol abuse and dependence: Secondary | ICD-10-CM | POA: Insufficient documentation

## 2016-12-07 DIAGNOSIS — Z87891 Personal history of nicotine dependence: Secondary | ICD-10-CM | POA: Insufficient documentation

## 2016-12-07 DIAGNOSIS — Z9981 Dependence on supplemental oxygen: Secondary | ICD-10-CM | POA: Diagnosis not present

## 2016-12-07 DIAGNOSIS — Z818 Family history of other mental and behavioral disorders: Secondary | ICD-10-CM | POA: Insufficient documentation

## 2016-12-07 DIAGNOSIS — M1990 Unspecified osteoarthritis, unspecified site: Secondary | ICD-10-CM | POA: Diagnosis not present

## 2016-12-07 DIAGNOSIS — K2210 Ulcer of esophagus without bleeding: Secondary | ICD-10-CM | POA: Insufficient documentation

## 2016-12-07 DIAGNOSIS — Z87442 Personal history of urinary calculi: Secondary | ICD-10-CM | POA: Diagnosis not present

## 2016-12-07 DIAGNOSIS — Z833 Family history of diabetes mellitus: Secondary | ICD-10-CM | POA: Insufficient documentation

## 2016-12-07 DIAGNOSIS — Z882 Allergy status to sulfonamides status: Secondary | ICD-10-CM | POA: Insufficient documentation

## 2016-12-07 DIAGNOSIS — Z85118 Personal history of other malignant neoplasm of bronchus and lung: Secondary | ICD-10-CM | POA: Insufficient documentation

## 2016-12-07 DIAGNOSIS — Z888 Allergy status to other drugs, medicaments and biological substances status: Secondary | ICD-10-CM | POA: Insufficient documentation

## 2016-12-07 DIAGNOSIS — Z91041 Radiographic dye allergy status: Secondary | ICD-10-CM | POA: Insufficient documentation

## 2016-12-07 HISTORY — PX: ESOPHAGOGASTRODUODENOSCOPY (EGD) WITH PROPOFOL: SHX5813

## 2016-12-07 HISTORY — DX: Polyneuropathy, unspecified: G62.9

## 2016-12-07 HISTORY — PX: COLONOSCOPY WITH PROPOFOL: SHX5780

## 2016-12-07 HISTORY — DX: Personal history of urinary calculi: Z87.442

## 2016-12-07 HISTORY — DX: Unspecified kidney failure: N19

## 2016-12-07 SURGERY — COLONOSCOPY WITH PROPOFOL
Anesthesia: Monitor Anesthesia Care

## 2016-12-07 MED ORDER — SODIUM CHLORIDE 0.9 % IV SOLN: INTRAVENOUS | Status: DC

## 2016-12-07 MED ORDER — SPOT INK MARKER SYRINGE KIT
PACK | SUBMUCOSAL | Status: AC
  Filled 2016-12-07: qty 5

## 2016-12-07 MED ORDER — SPOT INK MARKER SYRINGE KIT
PACK | SUBMUCOSAL | Status: DC | PRN
  Administered 2016-12-07: 5 mL via SUBMUCOSAL

## 2016-12-07 MED ORDER — LACTATED RINGERS IV SOLN
INTRAVENOUS | Status: DC | PRN
  Administered 2016-12-07: 14:00:00 via INTRAVENOUS

## 2016-12-07 MED ORDER — LIDOCAINE HCL (CARDIAC) 20 MG/ML IV SOLN
INTRAVENOUS | Status: DC | PRN
  Administered 2016-12-07: 40 mg via INTRATRACHEAL

## 2016-12-07 MED ORDER — PROPOFOL 500 MG/50ML IV EMUL
INTRAVENOUS | Status: DC | PRN
  Administered 2016-12-07: 75 ug/kg/min via INTRAVENOUS

## 2016-12-07 MED ORDER — PROPOFOL 10 MG/ML IV BOLUS
INTRAVENOUS | Status: DC | PRN
  Administered 2016-12-07: 20 mg via INTRAVENOUS
  Administered 2016-12-07: 30 mg via INTRAVENOUS
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
## 2016-12-07 NOTE — Anesthesia Postprocedure Evaluation (Signed)
Anesthesia Post Note  Patient: Tammie [name]  Procedure(s) Performed: Procedure(s) (LRB): COLONOSCOPY WITH PROPOFOL (N/A) ESOPHAGOGASTRODUODENOSCOPY (EGD) WITH PROPOFOL (N/A)  Patient location during evaluation: PACU Anesthesia Type: MAC Level of consciousness: awake and alert Pain management: pain level controlled Vital Signs Assessment: post-procedure vital signs reviewed and stable Respiratory status: spontaneous breathing, nonlabored ventilation, respiratory function stable and patient connected to nasal cannula oxygen Cardiovascular status: stable and blood pressure returned to baseline Anesthetic complications: no    Last Vitals:  Vitals:   12/07/16 1249  BP: (!) 147/61  Pulse: 78  Resp: (!) 24  Temp: 36.5 C    Last Pain:  Vitals:   12/07/16 1249  TempSrc: Oral                 Kamarri Fischetti S

## 2016-12-07 NOTE — Discharge Instructions (Signed)
YOU HAD AN ENDOSCOPIC PROCEDURE TODAY: Refer to the procedure report and other information in the discharge instructions given to you for any specific questions about what was found during the examination. If this information does not answer your questions, please call [place] office at [phone] to clarify.  ° °YOU SHOULD EXPECT: Some feelings of bloating in the abdomen. Passage of more gas than usual. Walking can help get rid of the air that was put into your GI tract during the procedure and reduce the bloating. If you had a lower endoscopy (such as a colonoscopy or flexible sigmoidoscopy) you may notice spotting of blood in your stool or on the toilet paper. Some abdominal soreness may be present for a day or two, also. ° °DIET: Your first meal following the procedure should be a light meal and then it is ok to progress to your normal diet. A half-sandwich or bowl of soup is an example of a good first meal. Heavy or fried foods are harder to digest and may make you feel nauseous or bloated. Drink plenty of fluids but you should avoid alcoholic beverages for 24 hours. If you had a esophageal dilation, please see attached instructions for diet.   ° °ACTIVITY: Your care partner should take you home directly after the procedure. You should plan to take it easy, moving slowly for the rest of the day. You can resume normal activity the day after the procedure however YOU SHOULD NOT DRIVE, use power tools, machinery or perform tasks that involve climbing or major physical exertion for 24 hours (because of the sedation medicines used during the test).  ° °SYMPTOMS TO REPORT IMMEDIATELY: °A gastroenterologist can be reached at any hour. Please call [phone]  for any of the following symptoms:  °Following lower endoscopy (colonoscopy, flexible sigmoidoscopy) °Excessive amounts of blood in the stool  °Significant tenderness, worsening of abdominal pains  °Swelling of the abdomen that is new, acute  °Fever of 100° or  higher  °Following upper endoscopy (EGD, EUS, ERCP, esophageal dilation) °Vomiting of blood or coffee ground material  °New, significant abdominal pain  °New, significant chest pain or pain under the shoulder blades  °Painful or persistently difficult swallowing  °New shortness of breath  °Black, tarry-looking or red, bloody stools ° °FOLLOW UP:  °If any biopsies were taken you will be contacted by phone or by letter within the next 1-3 weeks. Call [phone]  if you have not heard about the biopsies in 3 weeks.  °Please also call with any specific questions about appointments or follow up tests. ° °

## 2016-12-07 NOTE — Transfer of Care (Signed)
Immediate Anesthesia Transfer of Care Note  Patient: Tammie [name]  Procedure(s) Performed: Procedure(s): COLONOSCOPY WITH PROPOFOL (N/A) ESOPHAGOGASTRODUODENOSCOPY (EGD) WITH PROPOFOL (N/A)  Patient Location: Endoscopy Unit  Anesthesia Type:MAC  Level of Consciousness: awake and alert   Airway & Oxygen Therapy: Patient Spontanous Breathing and Patient connected to nasal cannula oxygen  Post-op Assessment: Report given to RN and Post -op Vital signs reviewed and stable  Post vital signs: Reviewed and stable  Last Vitals:  Vitals:   12/07/16 1249  BP: (!) 147/61  Pulse: 78  Resp: (!) 24  Temp: 36.5 C    Last Pain:  Vitals:   12/07/16 1249  TempSrc: Oral         Complications: No apparent anesthesia complications

## 2016-12-07 NOTE — Op Note (Signed)
[facility] Patient Name: Tammie [name] Procedure Date : 12/07/2016 MRN: [id number] Attending MD: [name] , MD Date of Birth: March 28, 1941 CSN: [id number] Age: 75 Admit Type: Outpatient Procedure:                Colonoscopy Indications:              Observation, suspect malignant neoplasm, exclusion                            of cancer of GI tract. Metastatic liver mass                            (adenocarcinoma) Providers:                [name], MD, Dortha Schwalbe RN, RN,                            Ralene Bathe, Technician, Ivar Drape CRNA, CRNA Referring MD:              Medicines:                Monitored Anesthesia Care Complications:            No immediate complications. Estimated Blood Loss:     Estimated blood loss was minimal. Procedure:                Pre-Anesthesia Assessment:                           - Prior to the procedure, a History and Physical                            was performed, and patient medications and                            allergies were reviewed. The patient's tolerance of                            previous anesthesia was also reviewed. The risks                            and benefits of the procedure and the sedation                            options and risks were discussed with the patient.                            All questions were answered, and informed consent                            was obtained. Prior Anticoagulants: The patient                            last took Coumadin (warfarin) 7 days and Lovenox                            (  enoxaparin) 1 day prior to the procedure. ASA                            Grade Assessment: IV - A patient with severe                            systemic disease that is a constant threat to life.                            After reviewing the risks and benefits, the patient                            was deemed in satisfactory condition to undergo the               procedure.                           After obtaining informed consent, the colonoscope                            was passed under direct vision. Throughout the                            procedure, the patient's blood pressure, pulse, and                            oxygen saturations were monitored continuously. The                            EC-[id number] ([id number]) scope was introduced through                            the anus and advanced to the the terminal ileum,                            with identification of the appendiceal orifice and                            IC valve. The colonoscopy was performed without                            difficulty. The patient tolerated the procedure                            well. The quality of the bowel preparation was                            fair. The terminal ileum, ileocecal valve,                            appendiceal orifice, and rectum were photographed. Scope In: 2:24:37 PM Scope Out: 2:40:45 PM Scope Withdrawal Time: 0 hours 11 minutes 55 seconds  Total Procedure Duration: 0 hours 16 minutes 8 seconds  Findings:  The perianal and digital rectal examinations were normal.      A fungating, infiltrative and ulcerated non-obstructing large mass was       found in the ascending colon. The mass was partially circumferential       (involving one-half of the lumen circumference). The mass measured 10-15       cm in length. Oozing was present. This was biopsied with a cold forceps       for histology. Area 5-10 cm distal to the mass was tattooed with an       injection of 5 mL of Spot (carbon black).      Non-bleeding internal hemorrhoids were found during retroflexion. The       hemorrhoids were small. Impression:               - Preparation of the colon was fair.                           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                            Biopsied. Tattooed.                           - Non-bleeding  internal hemorrhoids. Moderate Sedation:      N/A Recommendation:           - Patient has a contact number available for                            emergencies. The signs and symptoms of potential                            delayed complications were discussed with the                            patient. Return to normal activities tomorrow.                            Written discharge instructions were provided to the                            patient.                           - Resume previous diet.                           - Continue present medications.                           - Resume Coumadin (warfarin) at prior dose                            tomorrow. Refer to [facility]in Clinic for further                            adjustment of therapy.                           -  Await pathology results.                           - Repeat colonoscopy is recommended. The                            colonoscopy date will be determined after pathology                            results from today's exam become available for                            review.                           - Return to GI office PRN. Procedure Code(s):        --- Professional ---                           [phone], Colonoscopy, flexible; with directed                            submucosal injection(s), any substance                           84696, Colonoscopy, flexible; with biopsy, single                            or multiple Diagnosis Code(s):        --- Professional ---                           K64.8, Other hemorrhoids                           D49.0, Neoplasm of unspecified behavior of                            digestive system                           Z03.89, Encounter for observation for other                            suspected diseases and conditions ruled out CPT copyright 2016 [employer] Association. All rights reserved. The codes documented in this report are preliminary and upon coder review  may  be revised to meet current compliance requirements. [name], MD 12/07/2016 3:05:24 PM This report has been signed electronically. Number of Addenda: 0

## 2016-12-07 NOTE — Interval H&P Note (Signed)
History and Physical Interval Note:  12/07/2016 1:30 PM  Tammie [name]  has presented today for surgery, with the diagnosis of liver mass/db on O2 at bedtime  The various methods of treatment have been discussed with the patient and family. After consideration of risks, benefits and other options for treatment, the patient has consented to  Procedure(s): COLONOSCOPY WITH PROPOFOL (N/A) ESOPHAGOGASTRODUODENOSCOPY (EGD) WITH PROPOFOL (N/A) as a surgical intervention .  The patient's history has been reviewed, patient examined, no change in status, stable for surgery.  I have reviewed the patient's chart and labs.  Questions were answered to the patient's satisfaction.     Jiselle Sheu

## 2016-12-07 NOTE — Anesthesia Preprocedure Evaluation (Signed)
Anesthesia Evaluation  Patient identified by MRN, date of birth, ID band Patient awake    Reviewed: Allergy & Precautions, NPO status , Patient's Chart, lab work & pertinent test results  Airway Mallampati: II  TM Distance: >3 FB Neck ROM: Full    Dental no notable dental hx.    Pulmonary asthma , COPD, former smoker,  Lung ca   Pulmonary exam normal breath sounds clear to auscultation       Cardiovascular hypertension, + Peripheral Vascular Disease  + dysrhythmias Atrial Fibrillation  Rhythm:Irregular Rate:Normal     Neuro/Psych CVA negative psych ROS   GI/Hepatic negative GI ROS, Neg liver ROS,   Endo/Other  negative endocrine ROS  Renal/GU Renal InsufficiencyRenal disease  negative genitourinary   Musculoskeletal negative musculoskeletal ROS (+)   Abdominal   Peds negative pediatric ROS (+)  Hematology negative hematology ROS (+)   Anesthesia Other Findings   Reproductive/Obstetrics negative OB ROS                            Anesthesia Physical Anesthesia Plan  ASA: IV  Anesthesia Plan: MAC   Post-op Pain Management:    Induction: Intravenous  Airway Management Planned: Nasal Cannula  Additional Equipment:   Intra-op Plan:   Post-operative Plan:   Informed Consent: I have reviewed the patients History and Physical, chart, labs and discussed the procedure including the risks, benefits and alternatives for the proposed anesthesia with the patient or authorized representative who has indicated his/her understanding and acceptance.   Dental advisory given  Plan Discussed with: CRNA and Surgeon  Anesthesia Plan Comments:         Anesthesia Quick Evaluation

## 2016-12-07 NOTE — Op Note (Signed)
[facility] Patient Name: Tammie [name] Procedure Date : 12/07/2016 MRN: [id number] Attending MD: [name] , MD Date of Birth: 10/13/1941 CSN: [id number] Age: 75 Admit Type: Outpatient Procedure:                Upper GI endoscopy Indications:              Exclusion of tumor of the GI tract. Metastatic                            liver mass (adenocarcinoma) Providers:                [name], MD, Dortha Schwalbe RN, RN,                            Ralene Bathe, Technician, Ivar Drape CRNA, CRNA Referring MD:              Medicines:                Monitored Anesthesia Care Complications:            No immediate complications. Estimated Blood Loss:     Estimated blood loss: none. Procedure:                Pre-Anesthesia Assessment:                           - Prior to the procedure, a History and Physical                            was performed, and patient medications and                            allergies were reviewed. The patient's tolerance of                            previous anesthesia was also reviewed. The risks                            and benefits of the procedure and the sedation                            options and risks were discussed with the patient.                            All questions were answered, and informed consent                            was obtained. Prior Anticoagulants: The patient                            last took Coumadin (warfarin) 7 days and Lovenox                            (enoxaparin) 1 day prior to the procedure. ASA  Grade Assessment: IV - A patient with severe                            systemic disease that is a constant threat to life.                            After reviewing the risks and benefits, the patient                            was deemed in satisfactory condition to undergo the                            procedure.                           After  obtaining informed consent, the endoscope was                            passed under direct vision. Throughout the                            procedure, the patient's blood pressure, pulse, and                            oxygen saturations were monitored continuously. The                            EG-[id number] ([id number]) scope was introduced through the                            mouth, and advanced to the second part of duodenum.                            The upper GI endoscopy was accomplished without                            difficulty. The patient tolerated the procedure                            well. Scope In: Scope Out: Findings:      LA Grade B (one or more mucosal breaks greater than 5 mm, not extending       between the tops of two mucosal folds) esophagitis with no bleeding was       found 34 to 36 cm from the incisors.      The stomach was normal.      The examined duodenum was normal. Impression:               - LA Grade B erosive esophagitis.                           - Normal stomach.                           - Normal examined duodenum.                           -  No specimens collected. Moderate Sedation:      N/A Recommendation:           - Patient has a contact number available for                            emergencies. The signs and symptoms of potential                            delayed complications were discussed with the                            patient. Return to normal activities tomorrow.                            Written discharge instructions were provided to the                            patient.                           - Resume previous diet.                           - Continue present medications.                           - Follow an antireflux regimen.                           - Proceed with colonoscopy Procedure Code(s):        --- Professional ---                           [phone], Esophagogastroduodenoscopy, flexible,                             transoral; diagnostic, including collection of                            specimen(s) by brushing or washing, when performed                            (separate procedure) Diagnosis Code(s):        --- Professional ---                           K20.8, Other esophagitis CPT copyright 2016 American Medical Association. All rights reserved. The codes documented in this report are preliminary and upon coder review may  be revised to meet current compliance requirements. [name], MD 12/07/2016 2:54:36 PM This report has been signed electronically. Number of Addenda: 0

## 2016-12-07 NOTE — Progress Notes (Signed)
Reviewed and agree with documentation and assessment and plan. K. Veena Maksymilian Mabey , MD   

## 2016-12-09 ENCOUNTER — Encounter (HOSPITAL_COMMUNITY)
Admission: RE | Admit: 2016-12-09 | Discharge: 2016-12-09 | Disposition: A | Discharge status: Home / Self Care | Payer: PPO | Source: Ambulatory Visit | Attending: Internal Medicine | Admitting: Internal Medicine

## 2016-12-09 ENCOUNTER — Encounter: Payer: Self-pay | Admitting: Pharmacist

## 2016-12-09 DIAGNOSIS — C50911 Malignant neoplasm of unspecified site of right female breast: Secondary | ICD-10-CM | POA: Diagnosis not present

## 2016-12-09 DIAGNOSIS — C3431 Malignant neoplasm of lower lobe, right bronchus or lung: Secondary | ICD-10-CM | POA: Diagnosis not present

## 2016-12-09 DIAGNOSIS — C50411 Malignant neoplasm of upper-outer quadrant of right female breast: Secondary | ICD-10-CM | POA: Diagnosis not present

## 2016-12-09 LAB — GLUCOSE, CAPILLARY: GLUCOSE-CAPILLARY: 111 mg/dL — AB (ref 65–99)

## 2016-12-09 MED ORDER — FLUDEOXYGLUCOSE F - 18 (FDG) INJECTION
13.0900 | Freq: Once | INTRAVENOUS | Status: AC | PRN
  Administered 2016-12-09: 13.09 via INTRAVENOUS

## 2016-12-10 ENCOUNTER — Encounter (HOSPITAL_COMMUNITY): Payer: Self-pay

## 2016-12-10 ENCOUNTER — Ambulatory Visit (INDEPENDENT_AMBULATORY_CARE_PROVIDER_SITE_OTHER): Payer: PPO | Admitting: Pharmacist Clinician (PhC)/ Clinical Pharmacy Specialist

## 2016-12-10 DIAGNOSIS — Z7901 Long term (current) use of anticoagulants: Secondary | ICD-10-CM | POA: Diagnosis not present

## 2016-12-10 DIAGNOSIS — I4891 Unspecified atrial fibrillation: Secondary | ICD-10-CM | POA: Diagnosis not present

## 2016-12-10 LAB — COAGUCHEK XS/INR WAIVED
INR: 1.3 — AB (ref 0.9–1.1)
Prothrombin Time: 15.3 s

## 2016-12-10 NOTE — Patient Instructions (Addendum)
Anticoagulation Dose Instructions as of 12/10/2016      [name] Tue Wed Thu Fri Sat   New Dose 2.5 mg 2.5 mg 2.5 mg 2.5 mg 2.5 mg 0 mg 2.5 mg    Description   Warfarin directions: Take 1 tablet today and then take 1/2 tablet a day until it is re-checked on Monday.  Continue taking lovenox once daily until Monday.  INR today is 1.3

## 2016-12-13 ENCOUNTER — Ambulatory Visit (INDEPENDENT_AMBULATORY_CARE_PROVIDER_SITE_OTHER): Payer: PPO | Admitting: Pharmacist

## 2016-12-13 DIAGNOSIS — I4891 Unspecified atrial fibrillation: Secondary | ICD-10-CM

## 2016-12-13 DIAGNOSIS — Z7901 Long term (current) use of anticoagulants: Secondary | ICD-10-CM

## 2016-12-13 LAB — COAGUCHEK XS/INR WAIVED
INR: 2.2 — ABNORMAL HIGH (ref 0.9–1.1)
PROTHROMBIN TIME: 25.8 s

## 2016-12-17 ENCOUNTER — Telehealth: Payer: Self-pay | Admitting: Internal Medicine

## 2016-12-17 ENCOUNTER — Ambulatory Visit (HOSPITAL_BASED_OUTPATIENT_CLINIC_OR_DEPARTMENT_OTHER): Payer: PPO | Admitting: Internal Medicine

## 2016-12-17 ENCOUNTER — Other Ambulatory Visit (HOSPITAL_BASED_OUTPATIENT_CLINIC_OR_DEPARTMENT_OTHER): Payer: PPO

## 2016-12-17 ENCOUNTER — Encounter: Payer: Self-pay | Admitting: Internal Medicine

## 2016-12-17 VITALS — BP 149/78 | HR 64 | Temp 98.0°F | Resp 16 | Ht 71.0 in | Wt 271.0 lb

## 2016-12-17 DIAGNOSIS — N289 Disorder of kidney and ureter, unspecified: Secondary | ICD-10-CM

## 2016-12-17 DIAGNOSIS — C3431 Malignant neoplasm of lower lobe, right bronchus or lung: Secondary | ICD-10-CM

## 2016-12-17 DIAGNOSIS — Z85118 Personal history of other malignant neoplasm of bronchus and lung: Secondary | ICD-10-CM | POA: Diagnosis not present

## 2016-12-17 DIAGNOSIS — Z853 Personal history of malignant neoplasm of breast: Secondary | ICD-10-CM

## 2016-12-17 DIAGNOSIS — I10 Essential (primary) hypertension: Secondary | ICD-10-CM

## 2016-12-17 DIAGNOSIS — C787 Secondary malignant neoplasm of liver and intrahepatic bile duct: Secondary | ICD-10-CM | POA: Diagnosis not present

## 2016-12-17 DIAGNOSIS — C50411 Malignant neoplasm of upper-outer quadrant of right female breast: Secondary | ICD-10-CM

## 2016-12-17 DIAGNOSIS — C229 Malignant neoplasm of liver, not specified as primary or secondary: Secondary | ICD-10-CM

## 2016-12-17 DIAGNOSIS — Z5112 Encounter for antineoplastic immunotherapy: Secondary | ICD-10-CM

## 2016-12-17 DIAGNOSIS — C801 Malignant (primary) neoplasm, unspecified: Secondary | ICD-10-CM

## 2016-12-17 HISTORY — DX: Encounter for antineoplastic immunotherapy: Z51.12

## 2016-12-17 LAB — CBC WITH DIFFERENTIAL/PLATELET
BASO%: 1.1 % (ref 0.0–2.0)
BASOS ABS: 0.1 10*3/uL (ref 0.0–0.1)
EOS%: 3.3 % (ref 0.0–7.0)
Eosinophils Absolute: 0.2 10*3/uL (ref 0.0–0.5)
HEMATOCRIT: 36.2 % (ref 34.8–46.6)
HGB: 11 g/dL — ABNORMAL LOW (ref 11.6–15.9)
LYMPH%: 15.8 % (ref 14.0–49.7)
MCH: 26.3 pg (ref 25.1–34.0)
MCHC: 30.4 g/dL — AB (ref 31.5–36.0)
MCV: 86.4 fL (ref 79.5–101.0)
MONO#: 0.4 10*3/uL (ref 0.1–0.9)
MONO%: 7.7 % (ref 0.0–14.0)
NEUT#: 4.1 10*3/uL (ref 1.5–6.5)
NEUT%: 72.1 % (ref 38.4–76.8)
PLATELETS: 223 10*3/uL (ref 145–400)
RBC: 4.19 10*6/uL (ref 3.70–5.45)
RDW: 14.5 % (ref 11.2–14.5)
WBC: 5.7 10*3/uL (ref 3.9–10.3)
lymph#: 0.9 10*3/uL (ref 0.9–3.3)

## 2016-12-17 LAB — COMPREHENSIVE METABOLIC PANEL
ALT: 14 U/L (ref 0–55)
ANION GAP: 8 meq/L (ref 3–11)
AST: 15 U/L (ref 5–34)
Albumin: 3.4 g/dL — ABNORMAL LOW (ref 3.5–5.0)
Alkaline Phosphatase: 66 U/L (ref 40–150)
BUN: 31.4 mg/dL — ABNORMAL HIGH (ref 7.0–26.0)
CALCIUM: 9.3 mg/dL (ref 8.4–10.4)
CHLORIDE: 108 meq/L (ref 98–109)
CO2: 26 meq/L (ref 22–29)
Creatinine: 1.8 mg/dL — ABNORMAL HIGH (ref 0.6–1.1)
EGFR: 27 mL/min/{1.73_m2} — AB (ref >90)
Glucose: 106 mg/dl (ref 70–140)
POTASSIUM: 4.6 meq/L (ref 3.5–5.1)
Sodium: 142 mEq/L (ref 136–145)
Total Bilirubin: 0.29 mg/dL (ref 0.20–1.20)
Total Protein: 6.9 g/dL (ref 6.4–8.3)

## 2016-12-17 LAB — CEA (IN HOUSE-CHCC): CEA (CHCC-IN HOUSE): 2.9 ng/mL (ref 0.00–5.00)

## 2016-12-17 NOTE — Telephone Encounter (Signed)
Labs and Chemo scheduled every 3 weeks,  Per 12/17/16 los. Follow up with Dr Julien Nordmann was scheduled for 01/20/17 , per 12/17/16 los. Patient was given a copy of the appointment schedule and AVS report, per 12/17/16 los.
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
## 2016-12-18 ENCOUNTER — Encounter: Payer: Self-pay | Admitting: Internal Medicine

## 2016-12-18 LAB — CANCER ANTIGEN 19-9: CA 19-9: 10 U/mL (ref 0–35)

## 2016-12-20 DIAGNOSIS — J449 Chronic obstructive pulmonary disease, unspecified: Secondary | ICD-10-CM | POA: Diagnosis not present

## 2016-12-21 ENCOUNTER — Telehealth: Payer: Self-pay | Admitting: *Deleted

## 2016-12-21 NOTE — Telephone Encounter (Signed)
FYI "I need a return call to my home because my cell phone charges for minutes and I've waited two hours.    Returned patients call.  "Asking to know if I'm receiving the correct treatment medication because everything is wrong.  I have kidney problems and do not need anymore problems from treatments.  I was told no chemotherapy by [name], I'd be treated on January 5th and the diagnosis information is incorrect with the [facility] in house lab done on December 22 appointment.  When I checked out I was told I'll receive chemotherapy, scheduled on th 4th and reason for today's visit reads breast cancer and I have lung cancer."    New and old diagnosis codes all are listed with labs in EPIC.  January 5th 2018, [name] is off work and would like to be accessible for her first treatment.  Will F/U within five weeks after first treatment.  Beryle Flock is a Targeted Immunotherapy Antibody sometimes given alone or with chemotherapy.  Everything is as discussed during the visit on 12-17-2016.  No further questions.

## 2016-12-24 ENCOUNTER — Other Ambulatory Visit: Payer: Self-pay | Admitting: Pediatrics

## 2016-12-24 ENCOUNTER — Telehealth: Payer: Self-pay | Admitting: Emergency Medicine

## 2016-12-24 DIAGNOSIS — I10 Essential (primary) hypertension: Secondary | ICD-10-CM

## 2016-12-24 NOTE — Telephone Encounter (Signed)
Patient called with concerns stating that she received a notice from her insurance company that her treatment was approved for her lung cancer; patient states the treatment is for her "colon cancer".   Reassured patient that this treatment is approved correctly and that she will be receiving the correct treatment ordered per Dr Julien Nordmann.

## 2016-12-29 ENCOUNTER — Ambulatory Visit (INDEPENDENT_AMBULATORY_CARE_PROVIDER_SITE_OTHER): Payer: PPO | Admitting: Pharmacist

## 2016-12-29 DIAGNOSIS — I4891 Unspecified atrial fibrillation: Secondary | ICD-10-CM

## 2016-12-29 DIAGNOSIS — Z7901 Long term (current) use of anticoagulants: Secondary | ICD-10-CM

## 2016-12-29 LAB — COAGUCHEK XS/INR WAIVED
INR: 2.2 — AB (ref 0.9–1.1)
Prothrombin Time: 26.4 s

## 2016-12-29 MED ORDER — ALBUTEROL SULFATE HFA 108 (90 BASE) MCG/ACT IN AERS: 2.0000 | INHALATION_SPRAY | Freq: Four times a day (QID) | RESPIRATORY_TRACT | 1 refills | Status: DC | PRN

## 2016-12-30 ENCOUNTER — Other Ambulatory Visit (HOSPITAL_BASED_OUTPATIENT_CLINIC_OR_DEPARTMENT_OTHER): Payer: PPO

## 2016-12-30 ENCOUNTER — Ambulatory Visit (HOSPITAL_BASED_OUTPATIENT_CLINIC_OR_DEPARTMENT_OTHER): Payer: PPO

## 2016-12-30 VITALS — BP 132/79 | HR 77 | Temp 98.7°F

## 2016-12-30 DIAGNOSIS — C801 Malignant (primary) neoplasm, unspecified: Secondary | ICD-10-CM

## 2016-12-30 DIAGNOSIS — C229 Malignant neoplasm of liver, not specified as primary or secondary: Secondary | ICD-10-CM

## 2016-12-30 DIAGNOSIS — I10 Essential (primary) hypertension: Secondary | ICD-10-CM | POA: Diagnosis not present

## 2016-12-30 DIAGNOSIS — Z5112 Encounter for antineoplastic immunotherapy: Secondary | ICD-10-CM

## 2016-12-30 DIAGNOSIS — C787 Secondary malignant neoplasm of liver and intrahepatic bile duct: Secondary | ICD-10-CM | POA: Diagnosis not present

## 2016-12-30 DIAGNOSIS — C3431 Malignant neoplasm of lower lobe, right bronchus or lung: Secondary | ICD-10-CM

## 2016-12-30 LAB — CBC WITH DIFFERENTIAL/PLATELET
BASO%: 0.9 % (ref 0.0–2.0)
Basophils Absolute: 0.1 10*3/uL (ref 0.0–0.1)
EOS ABS: 0.2 10*3/uL (ref 0.0–0.5)
EOS%: 2.6 % (ref 0.0–7.0)
HEMATOCRIT: 34.1 % — AB (ref 34.8–46.6)
HGB: 10.5 g/dL — ABNORMAL LOW (ref 11.6–15.9)
LYMPH%: 15.2 % (ref 14.0–49.7)
MCH: 26.4 pg (ref 25.1–34.0)
MCHC: 30.8 g/dL — AB (ref 31.5–36.0)
MCV: 85.9 fL (ref 79.5–101.0)
MONO#: 0.4 10*3/uL (ref 0.1–0.9)
MONO%: 6.7 % (ref 0.0–14.0)
NEUT#: 4.4 10*3/uL (ref 1.5–6.5)
NEUT%: 74.6 % (ref 38.4–76.8)
PLATELETS: 204 10*3/uL (ref 145–400)
RBC: 3.97 10*6/uL (ref 3.70–5.45)
RDW: 14.6 % — ABNORMAL HIGH (ref 11.2–14.5)
WBC: 5.9 10*3/uL (ref 3.9–10.3)
lymph#: 0.9 10*3/uL (ref 0.9–3.3)

## 2016-12-30 LAB — COMPREHENSIVE METABOLIC PANEL
ALT: 9 U/L (ref 0–55)
ANION GAP: 10 meq/L (ref 3–11)
AST: 13 U/L (ref 5–34)
Albumin: 3.4 g/dL — ABNORMAL LOW (ref 3.5–5.0)
Alkaline Phosphatase: 66 U/L (ref 40–150)
BILIRUBIN TOTAL: 0.36 mg/dL (ref 0.20–1.20)
BUN: 31.3 mg/dL — ABNORMAL HIGH (ref 7.0–26.0)
CALCIUM: 9.2 mg/dL (ref 8.4–10.4)
CHLORIDE: 112 meq/L — AB (ref 98–109)
CO2: 24 meq/L (ref 22–29)
Creatinine: 1.7 mg/dL — ABNORMAL HIGH (ref 0.6–1.1)
EGFR: 30 mL/min/{1.73_m2} — AB (ref >90)
Glucose: 111 mg/dl (ref 70–140)
Potassium: 4.3 mEq/L (ref 3.5–5.1)
Sodium: 146 mEq/L — ABNORMAL HIGH (ref 136–145)
TOTAL PROTEIN: 6.6 g/dL (ref 6.4–8.3)

## 2016-12-30 LAB — TSH: TSH: 2.27 m[IU]/L (ref 0.308–3.960)

## 2016-12-30 MED ORDER — SODIUM CHLORIDE 0.9 % IV SOLN
Freq: Once | INTRAVENOUS | Status: AC
  Administered 2016-12-30: 14:00:00 via INTRAVENOUS

## 2016-12-30 MED ORDER — SODIUM CHLORIDE 0.9 % IV SOLN
200.0000 mg | Freq: Once | INTRAVENOUS | Status: AC
  Administered 2016-12-30: 200 mg via INTRAVENOUS
  Filled 2016-12-30: qty 8

## 2016-12-30 NOTE — Progress Notes (Signed)
Per Dr. Earlie Server, [place] to treat with Creatinine of 1.7.   Wylene Simmer, BSN, RN 12/30/2016 2:15 PM

## 2016-12-30 NOTE — Patient Instructions (Signed)
[facility] Discharge Instructions for Patients Receiving Chemotherapy  Today you received the following chemotherapy agents: Keytruda   To help prevent nausea and vomiting after your treatment, we encourage you to take your nausea medication as prescribed.    If you develop nausea and vomiting that is not controlled by your nausea medication, call the clinic.   BELOW ARE SYMPTOMS THAT SHOULD BE REPORTED IMMEDIATELY:  *FEVER GREATER THAN 100.5 F  *CHILLS WITH OR WITHOUT FEVER  NAUSEA AND VOMITING THAT IS NOT CONTROLLED WITH YOUR NAUSEA MEDICATION  *UNUSUAL SHORTNESS OF BREATH  *UNUSUAL BRUISING OR BLEEDING  TENDERNESS IN MOUTH AND THROAT WITH OR WITHOUT PRESENCE OF ULCERS  *URINARY PROBLEMS  *BOWEL PROBLEMS  UNUSUAL RASH Items with * indicate a potential emergency and should be followed up as soon as possible.  Feel free to call the clinic you have any questions or concerns. The clinic phone number is [phone].  Please show the [facility] at check-in to the Emergency Department and triage nurse.  Pembrolizumab injection [facility]) What is this medicine? PEMBROLIZUMAB (pem broe liz ue mab) is a monoclonal antibody. It is used to treat melanoma, head and neck cancer, Hodgkin lymphoma, and non-small cell lung cancer. COMMON BRAND NAME(S): Keytruda What should I tell my health care provider before I take this medicine? They need to know if you have any of these conditions: -diabetes -immune system problems -inflammatory bowel disease -liver disease -lung or breathing disease -lupus -organ transplant -an unusual or allergic reaction to pembrolizumab, other medicines, foods, dyes, or preservatives -pregnant or trying to get pregnant -breast-feeding How should I use this medicine? This medicine is for infusion into a vein. It is given by a health care professional in a hospital or clinic setting. A special MedGuide will be given to you before  each treatment. Be sure to read this information carefully each time. Talk to your pediatrician regarding the use of this medicine in children. While this drug may be prescribed for selected conditions, precautions do apply. What if I miss a dose? It is important not to miss your dose. Call your doctor or health care professional if you are unable to keep an appointment. What may interact with this medicine? Interactions have not been studied. Give your health care provider a list of all the medicines, herbs, non-prescription drugs, or dietary supplements you use. Also tell them if you smoke, drink alcohol, or use illegal drugs. Some items may interact with your medicine. What should I watch for while using this medicine? Your condition will be monitored carefully while you are receiving this medicine. You may need blood work done while you are taking this medicine. Do not become pregnant while taking this medicine or for 4 months after stopping it. Women should inform their doctor if they wish to become pregnant or think they might be pregnant. There is a potential for serious side effects to an unborn child. Talk to your health care professional or pharmacist for more information. Do not breast-feed an infant while taking this medicine or for 4 months after the last dose. What side effects may I notice from receiving this medicine? Side effects that you should report to your doctor or health care professional as soon as possible: -allergic reactions like skin rash, itching or hives, swelling of the face, lips, or tongue -bloody or black, tarry -breathing problems -changes in vision -chest pain -chills -constipation -cough -dizziness or feeling faint or lightheaded -fast or irregular heartbeat -fever -flushing -  hair loss -low blood counts - this medicine may decrease the number of white blood cells, red blood cells and platelets. You may be at increased risk for infections and  bleeding. -muscle pain -muscle weakness -persistent headache -signs and symptoms of high blood sugar such as dizziness; dry mouth; dry skin; fruity breath; nausea; stomach pain; increased hunger or thirst; increased urination -signs and symptoms of kidney injury like trouble passing urine or change in the amount of urine -signs and symptoms of liver injury like dark urine, light-colored stools, loss of appetite, nausea, right upper belly pain, yellowing of the eyes or skin -stomach pain -sweating -weight loss Side effects that usually do not require medical attention (report to your doctor or health care professional if they continue or are bothersome): -decreased appetite -diarrhea -tiredness Where should I keep my medicine? This drug is given in a hospital or clinic and will not be stored at home.  2017 Elsevier/Gold Standard (2016-03-15 19:28:44)

## 2017-01-20 ENCOUNTER — Encounter: Payer: Self-pay | Admitting: Internal Medicine

## 2017-01-20 ENCOUNTER — Ambulatory Visit (HOSPITAL_BASED_OUTPATIENT_CLINIC_OR_DEPARTMENT_OTHER): Payer: PPO | Admitting: Internal Medicine

## 2017-01-20 ENCOUNTER — Telehealth: Payer: Self-pay | Admitting: Internal Medicine

## 2017-01-20 ENCOUNTER — Ambulatory Visit (HOSPITAL_BASED_OUTPATIENT_CLINIC_OR_DEPARTMENT_OTHER): Payer: PPO

## 2017-01-20 ENCOUNTER — Other Ambulatory Visit (HOSPITAL_BASED_OUTPATIENT_CLINIC_OR_DEPARTMENT_OTHER): Payer: PPO

## 2017-01-20 VITALS — BP 148/62 | HR 74 | Temp 97.8°F | Resp 16 | Ht 71.0 in | Wt 275.1 lb

## 2017-01-20 DIAGNOSIS — C787 Secondary malignant neoplasm of liver and intrahepatic bile duct: Secondary | ICD-10-CM

## 2017-01-20 DIAGNOSIS — I10 Essential (primary) hypertension: Secondary | ICD-10-CM | POA: Diagnosis not present

## 2017-01-20 DIAGNOSIS — C801 Malignant (primary) neoplasm, unspecified: Secondary | ICD-10-CM | POA: Diagnosis not present

## 2017-01-20 DIAGNOSIS — C229 Malignant neoplasm of liver, not specified as primary or secondary: Secondary | ICD-10-CM

## 2017-01-20 DIAGNOSIS — C3431 Malignant neoplasm of lower lobe, right bronchus or lung: Secondary | ICD-10-CM

## 2017-01-20 DIAGNOSIS — C50411 Malignant neoplasm of upper-outer quadrant of right female breast: Secondary | ICD-10-CM

## 2017-01-20 DIAGNOSIS — Z853 Personal history of malignant neoplasm of breast: Secondary | ICD-10-CM

## 2017-01-20 DIAGNOSIS — Z5112 Encounter for antineoplastic immunotherapy: Secondary | ICD-10-CM | POA: Diagnosis not present

## 2017-01-20 DIAGNOSIS — J449 Chronic obstructive pulmonary disease, unspecified: Secondary | ICD-10-CM | POA: Diagnosis not present

## 2017-01-20 DIAGNOSIS — Z85118 Personal history of other malignant neoplasm of bronchus and lung: Secondary | ICD-10-CM | POA: Diagnosis not present

## 2017-01-20 LAB — CBC WITH DIFFERENTIAL/PLATELET
BASO%: 1.1 % (ref 0.0–2.0)
BASOS ABS: 0.1 10*3/uL (ref 0.0–0.1)
EOS ABS: 0.2 10*3/uL (ref 0.0–0.5)
EOS%: 2.6 % (ref 0.0–7.0)
HEMATOCRIT: 34.7 % — AB (ref 34.8–46.6)
HEMOGLOBIN: 10.9 g/dL — AB (ref 11.6–15.9)
LYMPH#: 0.9 10*3/uL (ref 0.9–3.3)
LYMPH%: 14.3 % (ref 14.0–49.7)
MCH: 26.1 pg (ref 25.1–34.0)
MCHC: 31.3 g/dL — ABNORMAL LOW (ref 31.5–36.0)
MCV: 83.3 fL (ref 79.5–101.0)
MONO#: 0.3 10*3/uL (ref 0.1–0.9)
MONO%: 5.1 % (ref 0.0–14.0)
NEUT%: 76.9 % — ABNORMAL HIGH (ref 38.4–76.8)
NEUTROS ABS: 4.6 10*3/uL (ref 1.5–6.5)
Platelets: 217 10*3/uL (ref 145–400)
RBC: 4.16 10*6/uL (ref 3.70–5.45)
RDW: 15.2 % — AB (ref 11.2–14.5)
WBC: 6 10*3/uL (ref 3.9–10.3)

## 2017-01-20 LAB — COMPREHENSIVE METABOLIC PANEL
ALBUMIN: 3.5 g/dL (ref 3.5–5.0)
ALK PHOS: 61 U/L (ref 40–150)
ALT: 10 U/L (ref 0–55)
AST: 12 U/L (ref 5–34)
Anion Gap: 10 mEq/L (ref 3–11)
BILIRUBIN TOTAL: 0.43 mg/dL (ref 0.20–1.20)
BUN: 34.8 mg/dL — AB (ref 7.0–26.0)
CALCIUM: 9.2 mg/dL (ref 8.4–10.4)
CO2: 28 mEq/L (ref 22–29)
Chloride: 108 mEq/L (ref 98–109)
Creatinine: 1.6 mg/dL — ABNORMAL HIGH (ref 0.6–1.1)
EGFR: 31 mL/min/{1.73_m2} — AB (ref >90)
Glucose: 111 mg/dl (ref 70–140)
Potassium: 4.5 mEq/L (ref 3.5–5.1)
SODIUM: 145 meq/L (ref 136–145)
TOTAL PROTEIN: 6.7 g/dL (ref 6.4–8.3)

## 2017-01-20 LAB — TSH: TSH: 3.528 m(IU)/L (ref 0.308–3.960)

## 2017-01-20 MED ORDER — PEMBROLIZUMAB CHEMO INJECTION 100 MG/4ML
200.0000 mg | Freq: Once | INTRAVENOUS | Status: AC
  Administered 2017-01-20: 200 mg via INTRAVENOUS
  Filled 2017-01-20: qty 8

## 2017-01-20 MED ORDER — SODIUM CHLORIDE 0.9 % IV SOLN
Freq: Once | INTRAVENOUS | Status: AC
  Administered 2017-01-20: 13:00:00 via INTRAVENOUS

## 2017-01-20 NOTE — Progress Notes (Signed)
OK to treat with today's creatinine

## 2017-01-20 NOTE — Patient Instructions (Signed)
[facility] Discharge Instructions for Patients Receiving Chemotherapy  Today you received the following chemotherapy agents keytruda   To help prevent nausea and vomiting after your treatment, we encourage you to take your nausea medication as directed  If you develop nausea and vomiting that is not controlled by your nausea medication, call the clinic.   BELOW ARE SYMPTOMS THAT SHOULD BE REPORTED IMMEDIATELY:  *FEVER GREATER THAN 100.5 F  *CHILLS WITH OR WITHOUT FEVER  NAUSEA AND VOMITING THAT IS NOT CONTROLLED WITH YOUR NAUSEA MEDICATION  *UNUSUAL SHORTNESS OF BREATH  *UNUSUAL BRUISING OR BLEEDING  TENDERNESS IN MOUTH AND THROAT WITH OR WITHOUT PRESENCE OF ULCERS  *URINARY PROBLEMS  *BOWEL PROBLEMS  UNUSUAL RASH Items with * indicate a potential emergency and should be followed up as soon as possible.  Feel free to call the clinic you have any questions or concerns. The clinic phone number is [phone].  
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
## 2017-01-20 NOTE — Telephone Encounter (Signed)
Appointments scheduled per 1/25 LOS. Patient given AVS report and calendars with future scheduled appointments.

## 2017-01-28 ENCOUNTER — Ambulatory Visit: Payer: PPO | Admitting: Pediatrics

## 2017-01-31 ENCOUNTER — Telehealth: Payer: Self-pay | Admitting: *Deleted

## 2017-01-31 NOTE — Telephone Encounter (Signed)
She will have one more cycle of treatment before repeating the scan. I will schedule the scan when I see her next time.

## 2017-01-31 NOTE — Telephone Encounter (Signed)
Called patient with provider orders.  With this call asked about port-a-cath placement.  The nurses have a hard time finding veins.  I was told they would ask about port-a-cath being placed.  I have not heard anything yet for this being placed.

## 2017-01-31 NOTE — Telephone Encounter (Signed)
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  I am to see [name] 02-10-2017 but need Ct scans of chest, abdomen, pelvis set up before this visit.  The March scans are to be cancelled.  He told me this when I saw him last.  I need these appointments set up soon."  Return number [phone]."

## 2017-02-07 IMAGING — CT CT CHEST W/O CM
2 of 4 series · 15 of 36 positions shown, 18 images · non-contrast
Comparison: CT 02/18/2015

CLINICAL DATA: Subsequent treatment strategy for lung cancer.
Initial diagnosis in left lung and 8868. Right lung diagnosis 6839.
Additional history of breast cancer diagnosed 4123.

EXAM:
CT CHEST WITHOUT CONTRAST
TECHNIQUE: Multidetector CT imaging of the chest was performed following the
standard protocol without IV contrast.

[Series 2: chest w/o st · axial · non-contrast · 0.79mm/px · z∈[-322,-42]mm · 12 of 68 slices shown, 15 images]
[im 6/68  mediastinal]
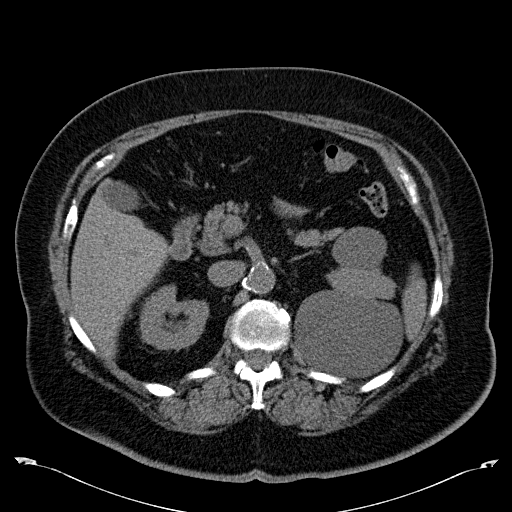
[im 6/68  lung]
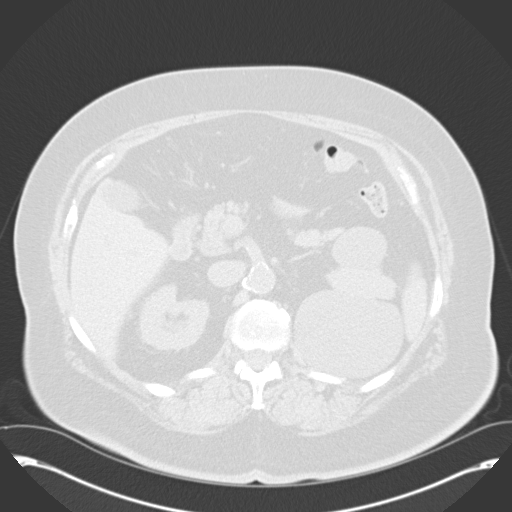
[im 11/68  lung]
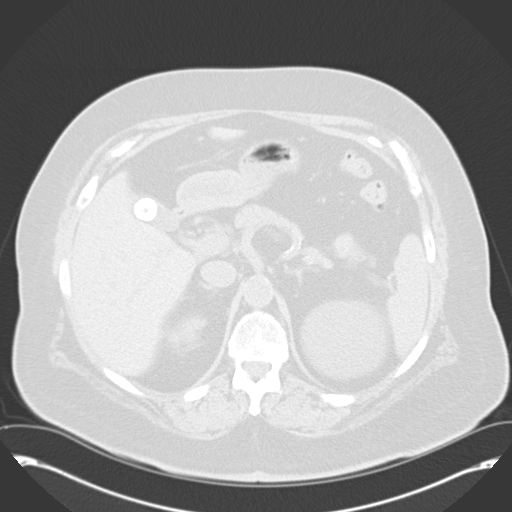
[im 16/68  lung]
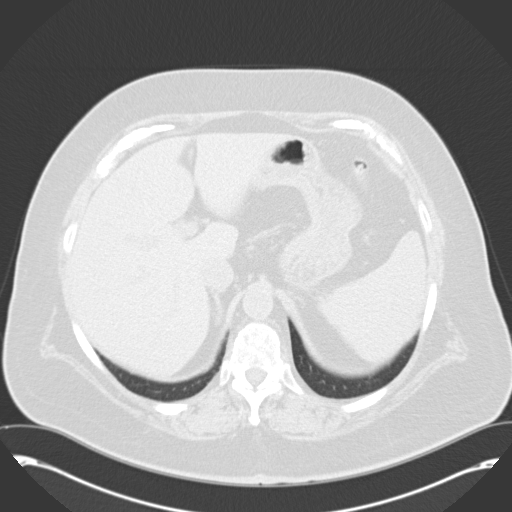
[im 21/68  lung]
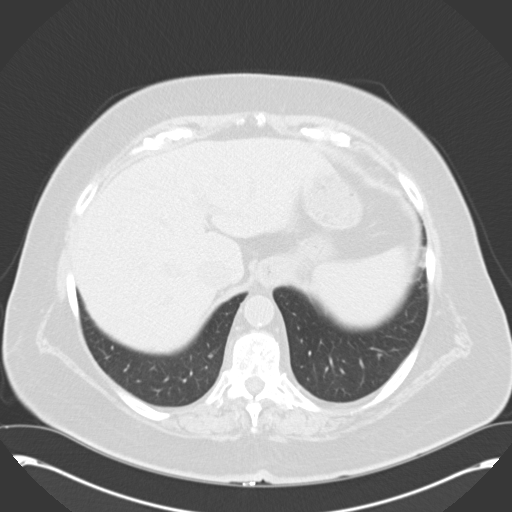
[im 26/68  mediastinal]
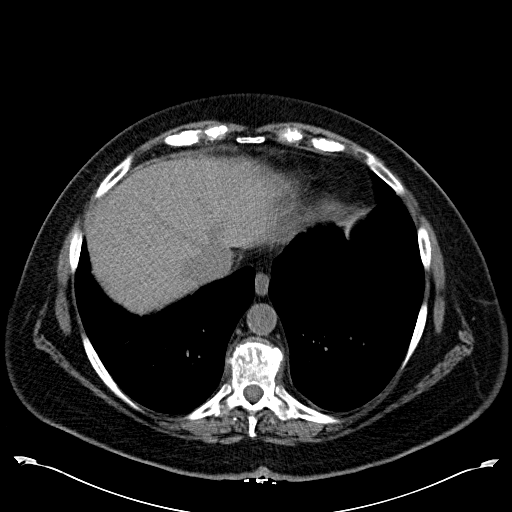
[im 26/68  lung]
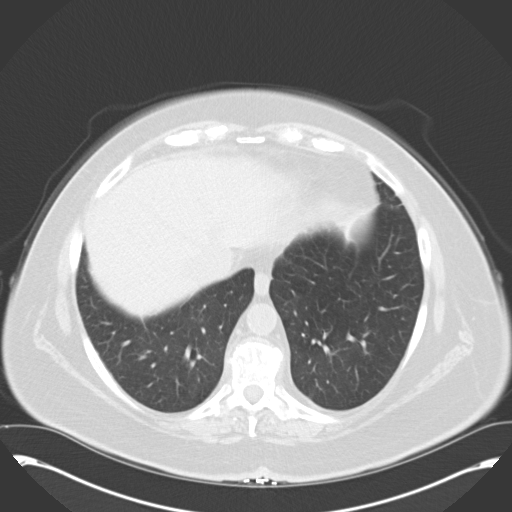
[im 31/68  lung]
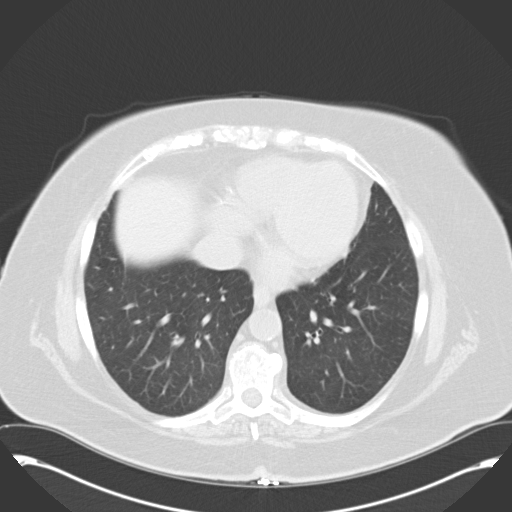
[im 37/68  lung]
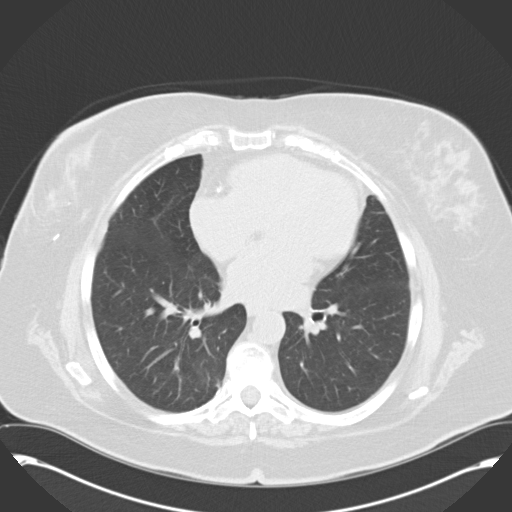
[im 42/68  lung]
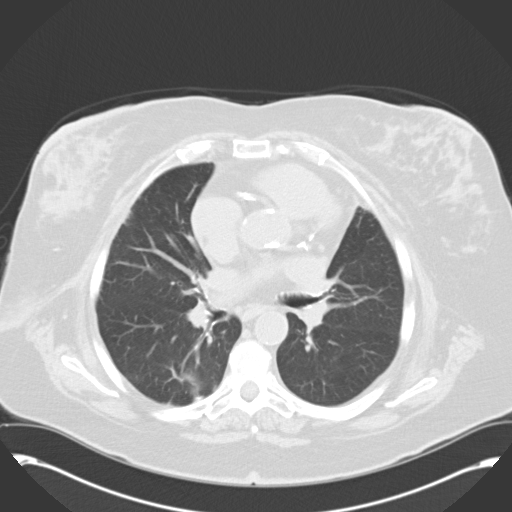
[im 47/68  mediastinal]
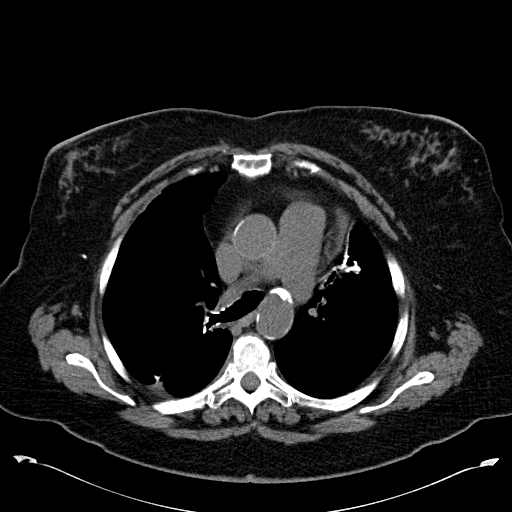
[im 47/68  lung]
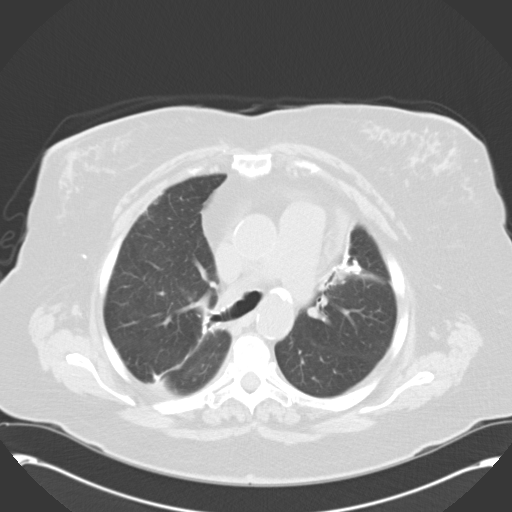
[im 52/68  lung]
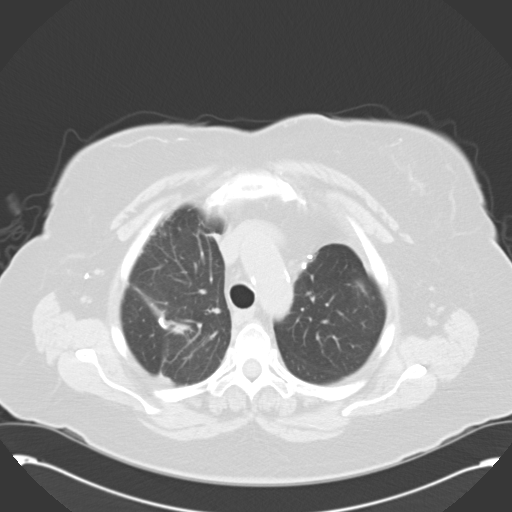
[im 57/68  lung]
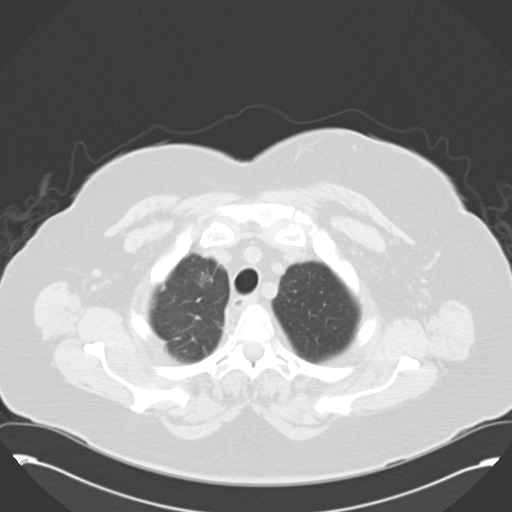
[im 62/68  lung]
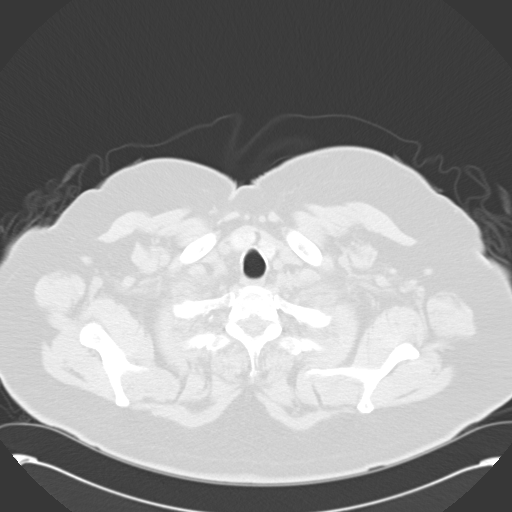

[Series 602: <mpr thick range> · coronal · 0.79mm/px · 3 of 106 slices shown]
[im 22/106  lung]
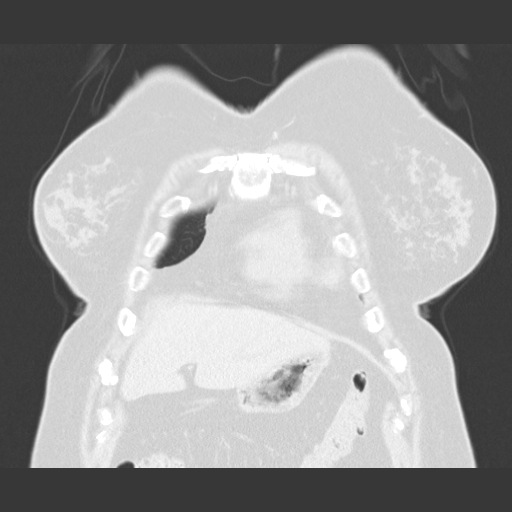
[im 43/106  lung]
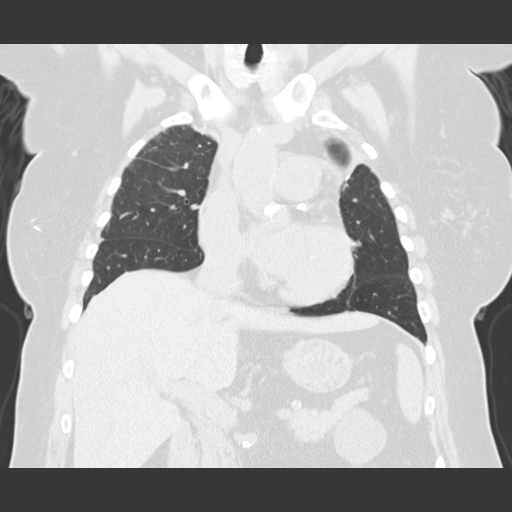
[im 64/106  lung]
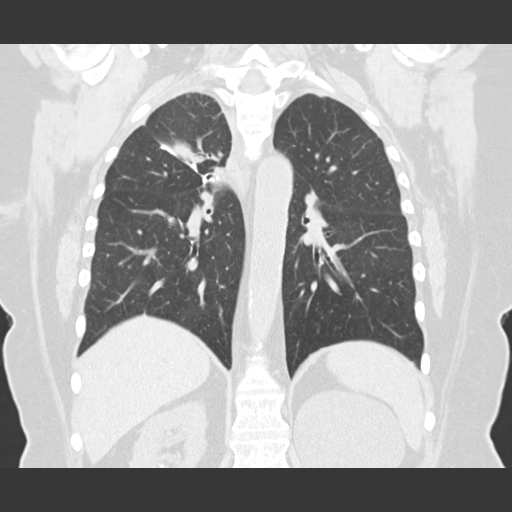

[15 of 36 positions shown; findings below may reference images not displayed]

FINDINGS: Mediastinum/Nodes: Lymphadenectomy clips in the right axilla. No
axillary or supraclavicular lymphadenopathy. No mediastinal hilar
lymphadenopathy. No pericardial fluid. Esophagus normal.

Lungs/Pleura: Postsurgical change in the right upper lobe. There is
linear pleural-parenchymal thickening along the surgical margins
without discrete nodularity. This a linear banding slightly improved
from comparison exam. Subtle peripheral nodularity in the right
upper lobe on image 17, series 5 suggests radiation change or other
chronic benign etiology.

Within the right lower lobe, 3 mm nodule on image 41, series 5
unchanged from 3 mm.

Within the left upper lobe there volume loss postsurgical change
which is also stable compared to prior. Measurable nodularity

Upper abdomen: Adrenal glands normal. Large gallstone noted.
Benign-appearing renal cysts. Kidneys not imaged

Musculoskeletal: Aggressive osseous lesion.
IMPRESSION: 1. Stable bilateral upper lobe postsurgical change without evidence
of local recurrence.
2. Stable small right lower lobe pulmonary nodule.
3. No mediastinal adenopathy.

## 2017-02-10 ENCOUNTER — Ambulatory Visit (HOSPITAL_BASED_OUTPATIENT_CLINIC_OR_DEPARTMENT_OTHER): Payer: PPO | Admitting: Internal Medicine

## 2017-02-10 ENCOUNTER — Ambulatory Visit: Payer: PPO

## 2017-02-10 ENCOUNTER — Telehealth: Payer: Self-pay | Admitting: Internal Medicine

## 2017-02-10 ENCOUNTER — Ambulatory Visit (HOSPITAL_BASED_OUTPATIENT_CLINIC_OR_DEPARTMENT_OTHER): Payer: PPO

## 2017-02-10 ENCOUNTER — Other Ambulatory Visit: Payer: PPO

## 2017-02-10 ENCOUNTER — Encounter: Payer: Self-pay | Admitting: Internal Medicine

## 2017-02-10 ENCOUNTER — Other Ambulatory Visit (HOSPITAL_BASED_OUTPATIENT_CLINIC_OR_DEPARTMENT_OTHER): Payer: PPO

## 2017-02-10 ENCOUNTER — Telehealth: Payer: Self-pay

## 2017-02-10 ENCOUNTER — Other Ambulatory Visit: Payer: Self-pay | Admitting: Pediatrics

## 2017-02-10 VITALS — BP 105/72 | HR 60 | Temp 98.6°F | Resp 18 | Ht 71.0 in | Wt 273.7 lb

## 2017-02-10 DIAGNOSIS — C801 Malignant (primary) neoplasm, unspecified: Secondary | ICD-10-CM | POA: Diagnosis not present

## 2017-02-10 DIAGNOSIS — C787 Secondary malignant neoplasm of liver and intrahepatic bile duct: Secondary | ICD-10-CM

## 2017-02-10 DIAGNOSIS — Z85118 Personal history of other malignant neoplasm of bronchus and lung: Secondary | ICD-10-CM | POA: Diagnosis not present

## 2017-02-10 DIAGNOSIS — I10 Essential (primary) hypertension: Secondary | ICD-10-CM

## 2017-02-10 DIAGNOSIS — Z5112 Encounter for antineoplastic immunotherapy: Secondary | ICD-10-CM

## 2017-02-10 DIAGNOSIS — C229 Malignant neoplasm of liver, not specified as primary or secondary: Secondary | ICD-10-CM

## 2017-02-10 DIAGNOSIS — C3431 Malignant neoplasm of lower lobe, right bronchus or lung: Secondary | ICD-10-CM

## 2017-02-10 DIAGNOSIS — Z853 Personal history of malignant neoplasm of breast: Secondary | ICD-10-CM | POA: Diagnosis not present

## 2017-02-10 DIAGNOSIS — I4891 Unspecified atrial fibrillation: Secondary | ICD-10-CM

## 2017-02-10 LAB — CBC WITH DIFFERENTIAL/PLATELET
BASO%: 0.9 % (ref 0.0–2.0)
Basophils Absolute: 0.1 10*3/uL (ref 0.0–0.1)
EOS%: 2.6 % (ref 0.0–7.0)
Eosinophils Absolute: 0.2 10*3/uL (ref 0.0–0.5)
HCT: 38.7 % (ref 34.8–46.6)
HEMOGLOBIN: 12 g/dL (ref 11.6–15.9)
LYMPH%: 18 % (ref 14.0–49.7)
MCH: 26.6 pg (ref 25.1–34.0)
MCHC: 31 g/dL — ABNORMAL LOW (ref 31.5–36.0)
MCV: 85.8 fL (ref 79.5–101.0)
MONO#: 0.6 10*3/uL (ref 0.1–0.9)
MONO%: 10.5 % (ref 0.0–14.0)
NEUT%: 68 % (ref 38.4–76.8)
NEUTROS ABS: 4 10*3/uL (ref 1.5–6.5)
Platelets: 184 10*3/uL (ref 145–400)
RBC: 4.51 10*6/uL (ref 3.70–5.45)
RDW: 14.7 % — AB (ref 11.2–14.5)
WBC: 5.8 10*3/uL (ref 3.9–10.3)
lymph#: 1.1 10*3/uL (ref 0.9–3.3)

## 2017-02-10 LAB — COMPREHENSIVE METABOLIC PANEL
ALBUMIN: 3.6 g/dL (ref 3.5–5.0)
ALT: 11 U/L (ref 0–55)
AST: 15 U/L (ref 5–34)
Alkaline Phosphatase: 67 U/L (ref 40–150)
Anion Gap: 10 mEq/L (ref 3–11)
BILIRUBIN TOTAL: 0.33 mg/dL (ref 0.20–1.20)
BUN: 39.7 mg/dL — AB (ref 7.0–26.0)
CO2: 24 meq/L (ref 22–29)
CREATININE: 1.7 mg/dL — AB (ref 0.6–1.1)
Calcium: 9.6 mg/dL (ref 8.4–10.4)
Chloride: 110 mEq/L — ABNORMAL HIGH (ref 98–109)
EGFR: 29 mL/min/{1.73_m2} — ABNORMAL LOW (ref >90)
GLUCOSE: 102 mg/dL (ref 70–140)
Potassium: 4.6 mEq/L (ref 3.5–5.1)
SODIUM: 143 meq/L (ref 136–145)
TOTAL PROTEIN: 6.9 g/dL (ref 6.4–8.3)

## 2017-02-10 LAB — TSH: TSH: 5.463 m(IU)/L — ABNORMAL HIGH (ref 0.308–3.960)

## 2017-02-10 MED ORDER — PEMBROLIZUMAB CHEMO INJECTION 100 MG/4ML
200.0000 mg | Freq: Once | INTRAVENOUS | Status: AC
  Administered 2017-02-10: 200 mg via INTRAVENOUS
  Filled 2017-02-10: qty 8

## 2017-02-10 MED ORDER — SODIUM CHLORIDE 0.9 % IV SOLN
Freq: Once | INTRAVENOUS | Status: AC
  Administered 2017-02-10: 15:00:00 via INTRAVENOUS

## 2017-02-10 NOTE — Progress Notes (Signed)
Ok to treat with creatinine 1.7 per [name].

## 2017-02-10 NOTE — Patient Instructions (Signed)
[facility] Discharge Instructions for Patients Receiving Chemotherapy  Today you received the following chemotherapy agents:  Keytruda.  To help prevent nausea and vomiting after your treatment, we encourage you to take your nausea medication as prescribed.   If you develop nausea and vomiting that is not controlled by your nausea medication, call the clinic.   BELOW ARE SYMPTOMS THAT SHOULD BE REPORTED IMMEDIATELY:  *FEVER GREATER THAN 100.5 F  *CHILLS WITH OR WITHOUT FEVER  NAUSEA AND VOMITING THAT IS NOT CONTROLLED WITH YOUR NAUSEA MEDICATION  *UNUSUAL SHORTNESS OF BREATH  *UNUSUAL BRUISING OR BLEEDING  TENDERNESS IN MOUTH AND THROAT WITH OR WITHOUT PRESENCE OF ULCERS  *URINARY PROBLEMS  *BOWEL PROBLEMS  UNUSUAL RASH Items with * indicate a potential emergency and should be followed up as soon as possible.  Feel free to call the clinic you have any questions or concerns. The clinic phone number is [phone].  Please show the CHEMO ALERT CARD at check-in to the Emergency Department and triage nurse.   

## 2017-02-10 NOTE — Telephone Encounter (Signed)
Appointments scheduled per 2/15 LOS. Patient given AVS report and calendars with future scheduled appointments.
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
## 2017-02-10 NOTE — Telephone Encounter (Signed)
Pt notified order is in [place]

## 2017-02-11 ENCOUNTER — Telehealth: Payer: Self-pay | Admitting: *Deleted

## 2017-02-11 NOTE — Telephone Encounter (Signed)
Per 2/15 LOS and staff message I have scheduled appts. Notified the scheduler

## 2017-02-20 DIAGNOSIS — J449 Chronic obstructive pulmonary disease, unspecified: Secondary | ICD-10-CM | POA: Diagnosis not present

## 2017-02-20 IMAGING — CR DG CHEST 2V
2 series · 2 of 2 positions shown · non-contrast
Comparison: CT 07/23/2015.  Chest x-ray 05/08/2015 and 08/27/2014.

CLINICAL DATA: Lung cancer.

EXAM:
CHEST  2 VIEW

[w chest pa]
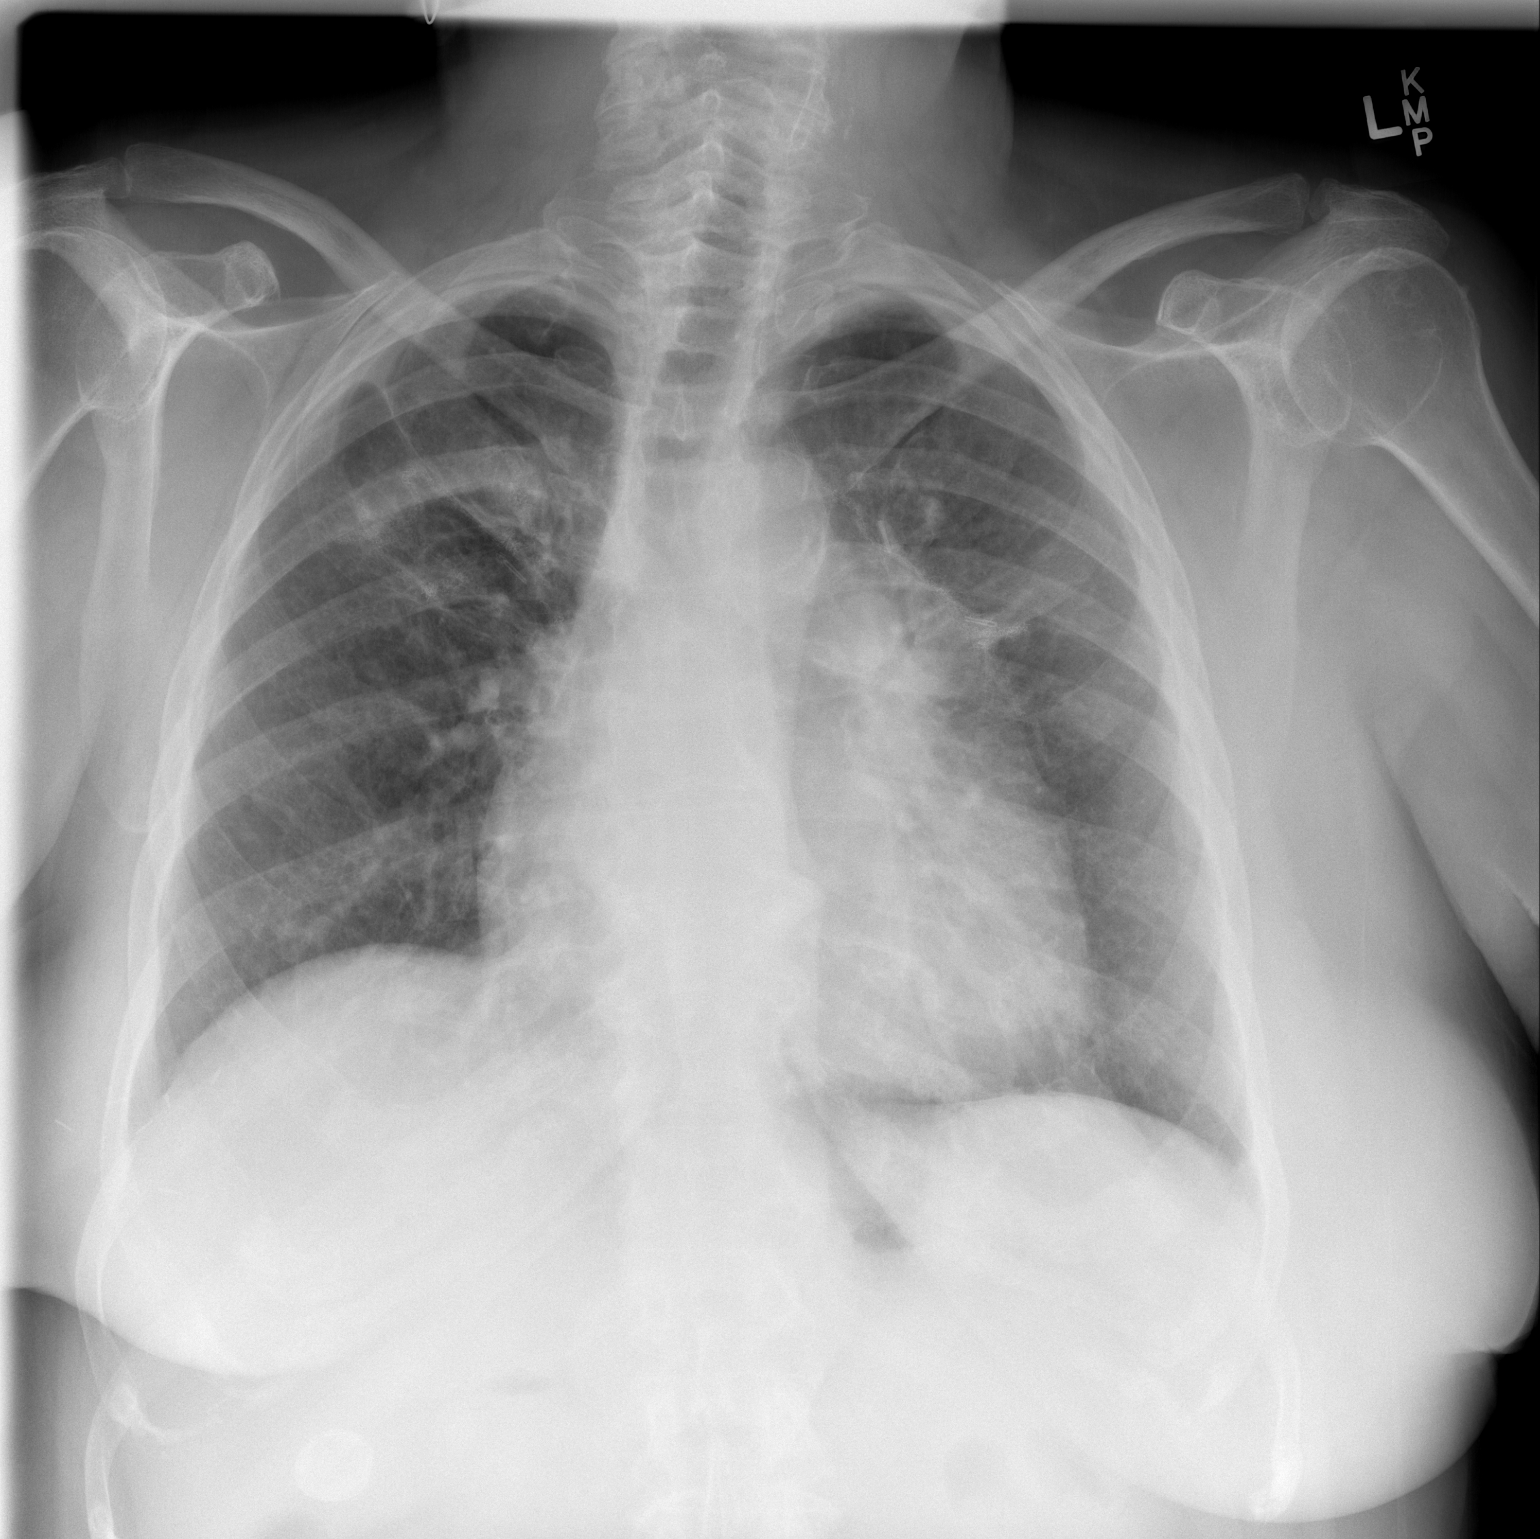

[w chest lat]
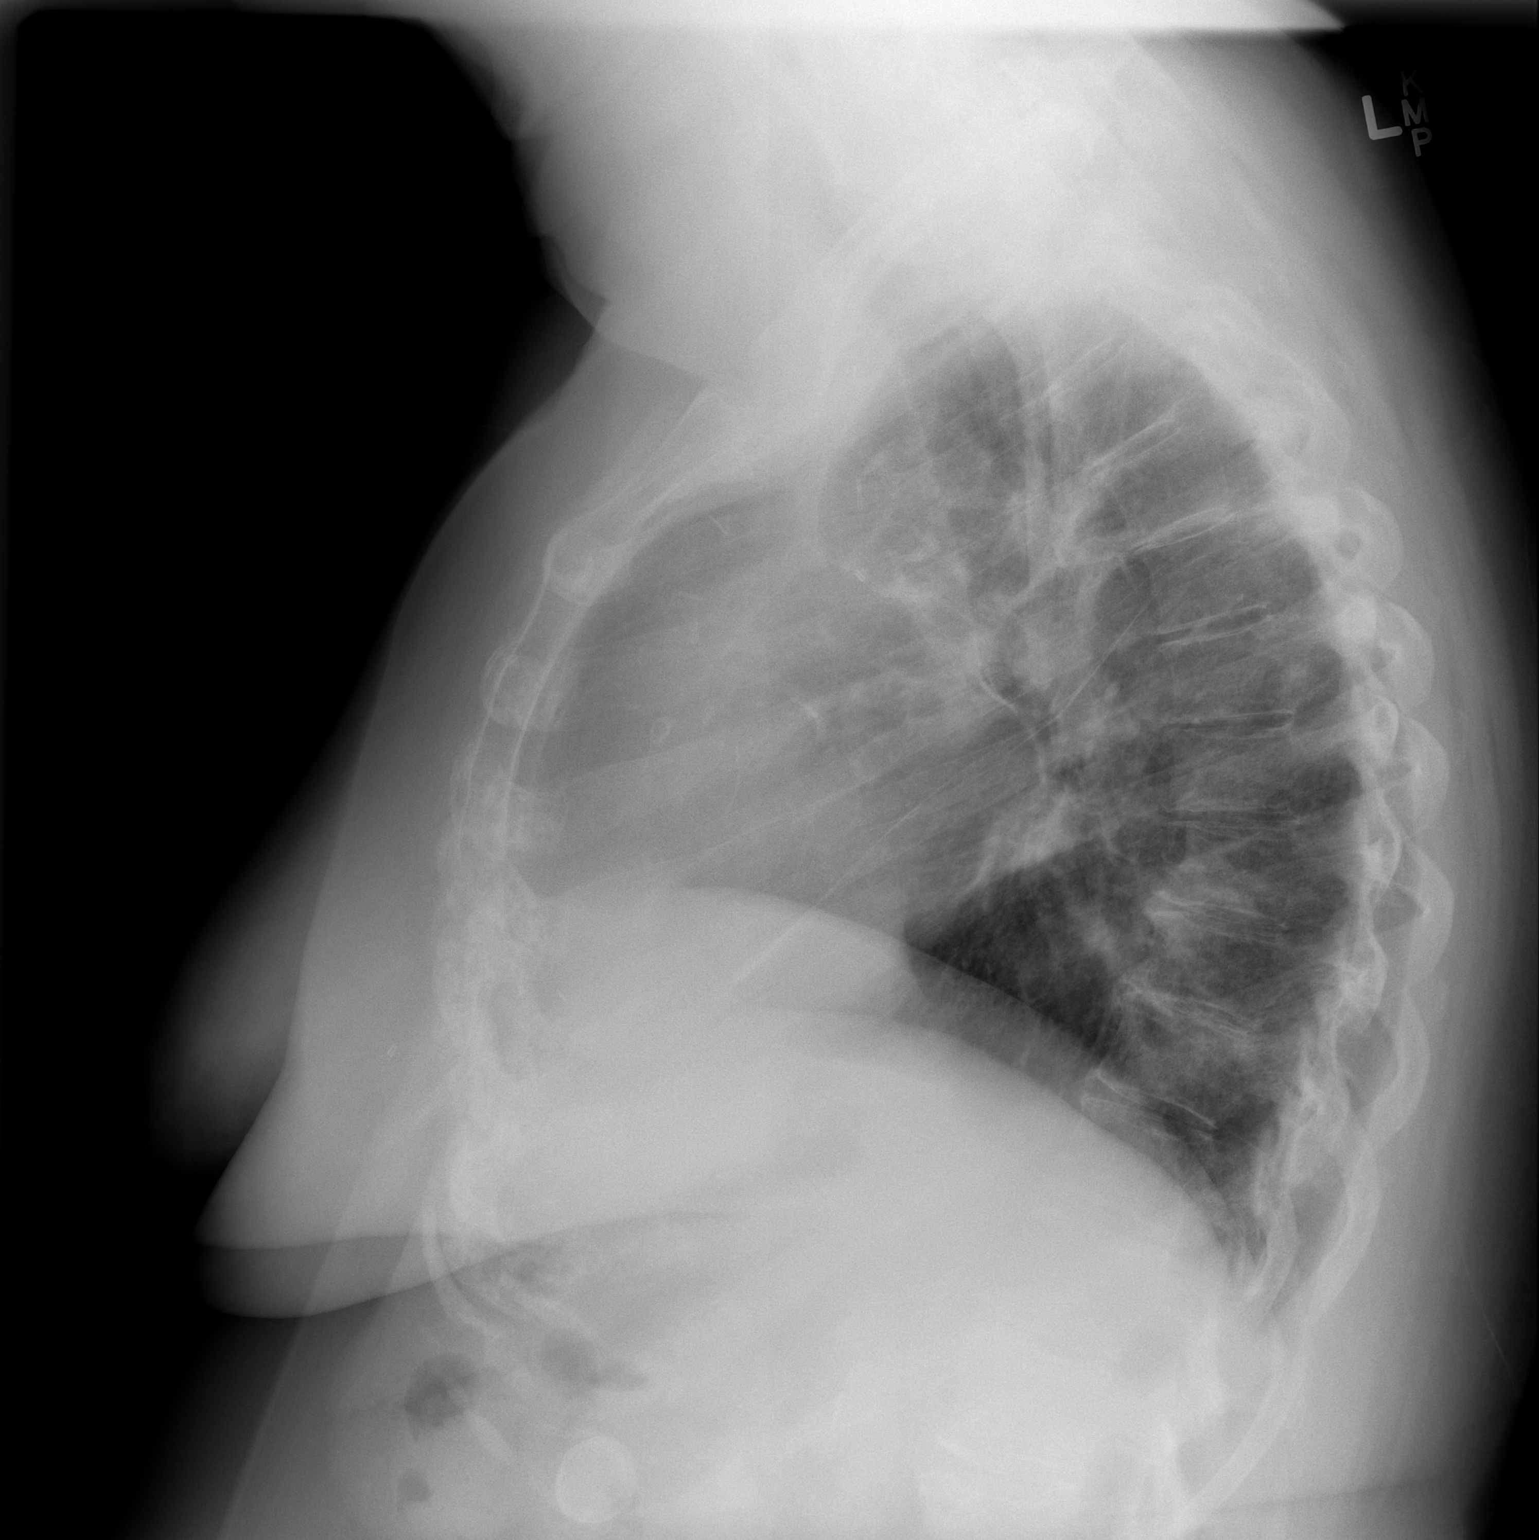

[2 of 2 positions shown; findings below may reference images not displayed]

FINDINGS: Mediastinum and hilar structures are stable. Stable postsurgical
changes both upper lobes with stable pleural parenchymal thickening
consistent with scarring. No pleural effusion pneumothorax. Stable
cardiomegaly. No acute bony abnormality. Degenerative changes
thoracic spine. Gallstone.
IMPRESSION: 1. Stable biapical postsurgical change and pleural-parenchymal
scarring.

2. Stable cardiomegaly.

3. Prominent gallstone.

## 2017-02-28 ENCOUNTER — Ambulatory Visit (INDEPENDENT_AMBULATORY_CARE_PROVIDER_SITE_OTHER): Payer: PPO | Admitting: Pharmacist

## 2017-02-28 DIAGNOSIS — Z7901 Long term (current) use of anticoagulants: Secondary | ICD-10-CM | POA: Diagnosis not present

## 2017-02-28 DIAGNOSIS — I4891 Unspecified atrial fibrillation: Secondary | ICD-10-CM

## 2017-02-28 LAB — COAGUCHEK XS/INR WAIVED
INR: 1.6 — ABNORMAL HIGH (ref 0.9–1.1)
Prothrombin Time: 18.7 s

## 2017-03-01 ENCOUNTER — Ambulatory Visit (HOSPITAL_COMMUNITY)
Admission: RE | Admit: 2017-03-01 | Discharge: 2017-03-01 | Disposition: A | Discharge status: Home / Self Care | Payer: PPO | Source: Ambulatory Visit | Attending: Internal Medicine | Admitting: Internal Medicine

## 2017-03-01 DIAGNOSIS — K8020 Calculus of gallbladder without cholecystitis without obstruction: Secondary | ICD-10-CM | POA: Diagnosis not present

## 2017-03-01 DIAGNOSIS — C229 Malignant neoplasm of liver, not specified as primary or secondary: Secondary | ICD-10-CM | POA: Diagnosis not present

## 2017-03-01 DIAGNOSIS — C3431 Malignant neoplasm of lower lobe, right bronchus or lung: Secondary | ICD-10-CM

## 2017-03-01 DIAGNOSIS — Z5112 Encounter for antineoplastic immunotherapy: Secondary | ICD-10-CM

## 2017-03-01 DIAGNOSIS — I700 Atherosclerosis of aorta: Secondary | ICD-10-CM | POA: Insufficient documentation

## 2017-03-01 DIAGNOSIS — C3490 Malignant neoplasm of unspecified part of unspecified bronchus or lung: Secondary | ICD-10-CM | POA: Diagnosis not present

## 2017-03-03 ENCOUNTER — Ambulatory Visit (HOSPITAL_BASED_OUTPATIENT_CLINIC_OR_DEPARTMENT_OTHER): Payer: PPO | Admitting: Nurse Practitioner

## 2017-03-03 ENCOUNTER — Other Ambulatory Visit: Payer: PPO

## 2017-03-03 ENCOUNTER — Telehealth: Payer: Self-pay | Admitting: *Deleted

## 2017-03-03 ENCOUNTER — Ambulatory Visit: Payer: PPO | Admitting: Internal Medicine

## 2017-03-03 ENCOUNTER — Other Ambulatory Visit (HOSPITAL_BASED_OUTPATIENT_CLINIC_OR_DEPARTMENT_OTHER): Payer: PPO

## 2017-03-03 ENCOUNTER — Ambulatory Visit (HOSPITAL_BASED_OUTPATIENT_CLINIC_OR_DEPARTMENT_OTHER): Payer: PPO

## 2017-03-03 VITALS — BP 156/76 | HR 67 | Temp 98.2°F | Resp 18 | Ht 71.0 in | Wt 276.1 lb

## 2017-03-03 DIAGNOSIS — C3431 Malignant neoplasm of lower lobe, right bronchus or lung: Secondary | ICD-10-CM

## 2017-03-03 DIAGNOSIS — C787 Secondary malignant neoplasm of liver and intrahepatic bile duct: Secondary | ICD-10-CM

## 2017-03-03 DIAGNOSIS — C801 Malignant (primary) neoplasm, unspecified: Secondary | ICD-10-CM

## 2017-03-03 DIAGNOSIS — R197 Diarrhea, unspecified: Secondary | ICD-10-CM | POA: Diagnosis not present

## 2017-03-03 DIAGNOSIS — Z79899 Other long term (current) drug therapy: Secondary | ICD-10-CM | POA: Diagnosis not present

## 2017-03-03 DIAGNOSIS — C229 Malignant neoplasm of liver, not specified as primary or secondary: Secondary | ICD-10-CM

## 2017-03-03 DIAGNOSIS — N289 Disorder of kidney and ureter, unspecified: Secondary | ICD-10-CM

## 2017-03-03 DIAGNOSIS — R300 Dysuria: Secondary | ICD-10-CM

## 2017-03-03 DIAGNOSIS — Z5112 Encounter for antineoplastic immunotherapy: Secondary | ICD-10-CM

## 2017-03-03 DIAGNOSIS — R3915 Urgency of urination: Secondary | ICD-10-CM | POA: Diagnosis not present

## 2017-03-03 DIAGNOSIS — K5900 Constipation, unspecified: Secondary | ICD-10-CM

## 2017-03-03 LAB — URINALYSIS, MICROSCOPIC - CHCC
BILIRUBIN (URINE): NEGATIVE
GLUCOSE UR CHCC: NEGATIVE mg/dL
KETONES: NEGATIVE mg/dL
Nitrite: POSITIVE
PH: 5 (ref 4.6–8.0)
Protein: NEGATIVE mg/dL
Specific Gravity, Urine: 1.02 (ref 1.003–1.035)
Urobilinogen, UR: 0.2 mg/dL (ref 0.2–1)

## 2017-03-03 LAB — CBC WITH DIFFERENTIAL/PLATELET
BASO%: 0.9 % (ref 0.0–2.0)
Basophils Absolute: 0 10*3/uL (ref 0.0–0.1)
EOS%: 2.5 % (ref 0.0–7.0)
Eosinophils Absolute: 0.1 10*3/uL (ref 0.0–0.5)
HEMATOCRIT: 39.6 % (ref 34.8–46.6)
HEMOGLOBIN: 12.1 g/dL (ref 11.6–15.9)
LYMPH#: 0.9 10*3/uL (ref 0.9–3.3)
LYMPH%: 19.6 % (ref 14.0–49.7)
MCH: 26.2 pg (ref 25.1–34.0)
MCHC: 30.6 g/dL — ABNORMAL LOW (ref 31.5–36.0)
MCV: 85.7 fL (ref 79.5–101.0)
MONO#: 0.4 10*3/uL (ref 0.1–0.9)
MONO%: 8.4 % (ref 0.0–14.0)
NEUT%: 68.6 % (ref 38.4–76.8)
NEUTROS ABS: 3 10*3/uL (ref 1.5–6.5)
Platelets: 195 10*3/uL (ref 145–400)
RBC: 4.62 10*6/uL (ref 3.70–5.45)
RDW: 14.5 % (ref 11.2–14.5)
WBC: 4.4 10*3/uL (ref 3.9–10.3)

## 2017-03-03 LAB — COMPREHENSIVE METABOLIC PANEL
ALT: 13 U/L (ref 0–55)
ANION GAP: 9 meq/L (ref 3–11)
AST: 16 U/L (ref 5–34)
Albumin: 3.7 g/dL (ref 3.5–5.0)
Alkaline Phosphatase: 70 U/L (ref 40–150)
BILIRUBIN TOTAL: 0.33 mg/dL (ref 0.20–1.20)
BUN: 30.1 mg/dL — AB (ref 7.0–26.0)
CALCIUM: 9.2 mg/dL (ref 8.4–10.4)
CO2: 23 mEq/L (ref 22–29)
Chloride: 109 mEq/L (ref 98–109)
Creatinine: 1.5 mg/dL — ABNORMAL HIGH (ref 0.6–1.1)
EGFR: 33 mL/min/{1.73_m2} — ABNORMAL LOW (ref >90)
Glucose: 95 mg/dl (ref 70–140)
Potassium: 4.8 mEq/L (ref 3.5–5.1)
Sodium: 140 mEq/L (ref 136–145)
TOTAL PROTEIN: 7 g/dL (ref 6.4–8.3)

## 2017-03-03 LAB — TSH: TSH: 2.953 m[IU]/L (ref 0.308–3.960)

## 2017-03-03 MED ORDER — SODIUM CHLORIDE 0.9 % IV SOLN
200.0000 mg | Freq: Once | INTRAVENOUS | Status: AC
  Administered 2017-03-03: 200 mg via INTRAVENOUS
  Filled 2017-03-03: qty 8

## 2017-03-03 MED ORDER — LIDOCAINE-PRILOCAINE 2.5-2.5 % EX CREA: 1.0000 "application " | TOPICAL_CREAM | CUTANEOUS | 0 refills | Status: DC | PRN

## 2017-03-03 MED ORDER — SODIUM CHLORIDE 0.9 % IV SOLN
Freq: Once | INTRAVENOUS | Status: AC
  Administered 2017-03-03: 14:00:00 via INTRAVENOUS

## 2017-03-03 NOTE — Progress Notes (Addendum)
[place] OFFICE PROGRESS NOTE   DIAGNOSIS:  1) stage IA (T1a, N0, M0) non-small cell lung cancer consistent with adenocarcinoma with negative EGFR, ALK mutation diagnosed in September 2012. The patient also had bilateral groundglass opacities suspicious for low-grade adenocarcinoma that time. 2) stage IA (T1c, N0, M0) invasive ductal carcinoma, low grade, triple negative diagnosed in November 2014. 3) stage IIIa (T4, N0, M0) non-small cell lung cancer, adenocarcinoma involving the right upper and right lower lobes diagnosed in July 2015. 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  Genomic Alterations Identified? BRAF V600E PTCH1 S1233f*52 RNF43 [id number] ARID1A T78368f3 CDC73 R147C CEBPA P14f59fCTCF Q117* EP300 M1fs*32fKDM5C R943* LRP1B N2900fs*38fAP2K4 G111* SOX9 Q347fs*232fPTA1 R268* TGFBR2 D524N Additional Findings? Microsatellite status MSI-High Tumor Mutation Burden TMB-High; 42 Muts/Mb  PRIOR THERAPY: 1) Status post left VATS with wedge resection of the left upper lobe lesion and node sampling under the care of Dr. Burney Arlyce Dice05/2012. 2) Status post right breast lumpectomy with needle localization and axillary lymph node biopsy under the care of Dr. Toth onMarlou Starks12/2014, revealing a tumor measuring 1.2 CM invasive ductal carcinoma with negative sentinel lymph node biopsies. She declined adjuvant chemotherapy. 3) status post curative adjuvant radiotherapy to the right breast for a total dose of 50 GYN 25 fractions completed on 02/25/2014 under the care of Dr. WentworPablo Ledgerght video-assisted thoracoscopy Wedge resection of superior segment right lower lobe  Posterior segmentectomy right upper lobe with lymph node dissection under the care of Dr. HendricRoxan Hockey21/2015. 5) Adjuvant systemic chemotherapy with cisplatin 75 mg/M2 and Alimta 500 mg/M2  every 3 weeks. Status post 4 cycles. First dose 09/12/2014 completed on 11/25/2014.  CURRENT THERAPY: Ketruda (pembrolizumab) 200 mg IV every 3 weeks. First dose 12/30/2016 for a patient with adenocarcinoma with MSI-High.  Status post 3 cycles.    INTERVAL HISTORY:   Tammie [name] as scheduled. She completed cycle 3 Pembrolizumab 02/10/2017. She denies nausea/vomiting. Her mouth feels sore intermittently. No ulcers. She has intermittent loose stools. The loose stools do not occur on a daily basis. Bowel habits recently have been alternating constipation and diarrhea. No rash. Stable dyspnea on exertion. She complains of urinary urgency. No significant dysuria.  Objective:  Vital signs in last 24 hours:  Blood pressure (!) 156/76, pulse 67, temperature 98.2 F (36.8 C), temperature source Oral, resp. rate 18, height _0  (1.803 m), weight 276 lb 1.6 oz (125.2 kg), SpO2 97 %.    HEENT: No thrush or ulcers. Mucous membranes appear moist. Resp: Lungs with faint scattered expiratory wheezes. No respiratory distress. Cardio: Regular rate and rhythm. GI: Abdomen soft and nontender. No hepatomegaly. Vascular: Trace edema at the lower legs bilaterally. Neuro: Alert and oriented.  Skin: No rash.    Lab Results:  Lab Results  Component Value Date   WBC 4.4 03/03/2017   HGB 12.1 03/03/2017   HCT 39.6 03/03/2017   MCV 85.7 03/03/2017   PLT 195 03/03/2017   NEUTROABS 3.0 03/03/2017    Imaging:  No results found.  Medications: I have reviewed the patient's current medications.  Assessment/Plan: 1. Metastatic adenocarcinoma to the liver of unknown primary questionable for pancreaticobiliary malignancy with MSI high. Currently on active treatment with Pembrolizumab every 3 weeks. She has completed 3 cycles. Restaging CT scans 03/01/2017 stable to improved. 2. History of recurrent non-small cell lung cancer status post surgical resection followed by adjuvant chemotherapy. On  observation.   Disposition: Tammie [name] appears stable. She has completed 3 cycles of Pembrolizumab. Recent restaging CT scans were stable to improved. [name] recommends continuation of Pembrolizumab. She will receive cycle 4 today as scheduled.  She is interested in a Port-A-Cath. We are making a referral to interventional radiology. A prescription was sent to her pharmacy for Emla cream.  She reports urinary urgency at today's visit. We will obtain a urinalysis.  She will return for a follow-up visit and cycle 5 Pembrolizumab in 3 weeks. She will contact the office in the interim with any problems.  Patient seen with [name].    Ned Card ANP/GNP-BC   03/03/2017  12:24 PM  ADDENDUM: Hematology/Oncology Attending: I had a face to face encounter with the patient. I recommended her care plan. [name] She also has a history of recurrent non-small cell lung cancer status post surgical resection and adjuvant systemic chemotherapy. The patient is currently on treatment with immunotherapy with Ketruda (pembrolizumab) status post 3 cycles. She had repeat CT scan of the chest, abdomen and pelvis performed recently. I personally and independently reviewed the scan images and discuss the results with the patient today. Her scan showed stable to mild improvement of her disease in the liver. I recommended for the patient to continue her current treatment with [place] (pembrolizumab) and she will proceed with cycle #4 today. For the urine urgency, we will order urine analysis and consider the patient for treatment with antibiotics if needed. She will come back for follow-up visit in 3 weeks for evaluation before starting cycle #5. The patient was advised to call immediately if she has any concerning symptoms in the  interval.  Disclaimer: This note was dictated with voice recognition software. Similar sounding words can inadvertently be transcribed and may be missed upon review. Eilleen Kempf., MD 03/04/17

## 2017-03-03 NOTE — Telephone Encounter (Signed)
Lattie Haw, NP ordered port placement. [facility] needs an order to hold Coumadin x 4 days. Notified Lattie Haw, NP she would like this to be deferred to  [facility], [place] to give orders/instructions.

## 2017-03-03 NOTE — Patient Instructions (Signed)
[facility] Discharge Instructions for Patients Receiving Chemotherapy  Today you received the following chemotherapy agents:  Keytruda.  To help prevent nausea and vomiting after your treatment, we encourage you to take your nausea medication as prescribed.   If you develop nausea and vomiting that is not controlled by your nausea medication, call the clinic.   BELOW ARE SYMPTOMS THAT SHOULD BE REPORTED IMMEDIATELY:  *FEVER GREATER THAN 100.5 F  *CHILLS WITH OR WITHOUT FEVER  NAUSEA AND VOMITING THAT IS NOT CONTROLLED WITH YOUR NAUSEA MEDICATION  *UNUSUAL SHORTNESS OF BREATH  *UNUSUAL BRUISING OR BLEEDING  TENDERNESS IN MOUTH AND THROAT WITH OR WITHOUT PRESENCE OF ULCERS  *URINARY PROBLEMS  *BOWEL PROBLEMS  UNUSUAL RASH Items with * indicate a potential emergency and should be followed up as soon as possible.  Feel free to call the clinic you have any questions or concerns. The clinic phone number is [phone].  Please show the CHEMO ALERT CARD at check-in to the Emergency Department and triage nurse.   

## 2017-03-04 ENCOUNTER — Telehealth: Payer: Self-pay | Admitting: Nurse Practitioner

## 2017-03-04 ENCOUNTER — Telehealth: Payer: Self-pay | Admitting: *Deleted

## 2017-03-04 ENCOUNTER — Encounter: Payer: Self-pay | Admitting: Nurse Practitioner

## 2017-03-04 ENCOUNTER — Other Ambulatory Visit: Payer: Self-pay | Admitting: Nurse Practitioner

## 2017-03-04 DIAGNOSIS — R300 Dysuria: Secondary | ICD-10-CM

## 2017-03-04 DIAGNOSIS — N3000 Acute cystitis without hematuria: Secondary | ICD-10-CM

## 2017-03-04 MED ORDER — CIPROFLOXACIN HCL 250 MG PO TABS: 250.0000 mg | ORAL_TABLET | Freq: Every day | ORAL | 0 refills | Status: AC

## 2017-03-04 NOTE — Telephone Encounter (Signed)
I contacted Tammie [name] to let her know a prescription was sent to her pharmacy for Cipro 250 mg daily for 3 days (dose reduced due to renal dysfunction). She understands Cipro can interact with Coumadin. We will contact the coumadin clinic at [facility] to notify of the above and request a PT/INR check early next week.

## 2017-03-04 NOTE — Telephone Encounter (Signed)
-----   Message from Belva Chimes, LPN sent at 05/04/2762  1:07 PM EST -----   ----- Message ----- From: Owens Shark, NP Sent: 03/04/2017  12:29 PM To: Belva Chimes, LPN  Please let her know the urinalysis from yesterday looks like she has an infection. Please verify if she has any allergies and what pharmacy she uses. Please ask lab to hold for culture. Thanks

## 2017-03-04 NOTE — Telephone Encounter (Signed)
Notified patient of the +UTI, patient has no additional allergy other than ones list in her chart. Will call in medication to [facility] in [place]. Lab was notified to add culture to the urine collected yesterday.

## 2017-03-05 ENCOUNTER — Other Ambulatory Visit: Payer: Self-pay | Admitting: Physician Assistant

## 2017-03-05 MED ORDER — AMOXICILLIN 500 MG PO CAPS: 500.0000 mg | ORAL_CAPSULE | Freq: Three times a day (TID) | ORAL | 0 refills | Status: DC

## 2017-03-07 ENCOUNTER — Telehealth: Payer: Self-pay | Admitting: *Deleted

## 2017-03-07 ENCOUNTER — Telehealth: Payer: Self-pay | Admitting: Pediatrics

## 2017-03-07 NOTE — Telephone Encounter (Signed)
Telephone call to [place] to [place] to advise pt currently under antibiotic treatment for UTI. She will notify pharmacist for the coumadin clinic who will follow up if needed.

## 2017-03-08 LAB — URINE CULTURE

## 2017-03-08 NOTE — Telephone Encounter (Signed)
Spoke with Tammie [name].  Cipro was switched to amoxicillin.  She has total of 7 days (03/05/2016 thru 03/11/2017).  INR on 02/28/17 was 1.6 She is due to stop warfarin 03/12/17 for 5 days prior to [facility] placement which is scheduled for 03/17/17.  INR will be checked prior to [facility] placement.

## 2017-03-16 ENCOUNTER — Other Ambulatory Visit: Payer: Self-pay | Admitting: Radiology

## 2017-03-17 ENCOUNTER — Encounter (HOSPITAL_COMMUNITY): Payer: Self-pay

## 2017-03-17 ENCOUNTER — Other Ambulatory Visit: Payer: Self-pay | Admitting: Nurse Practitioner

## 2017-03-17 ENCOUNTER — Ambulatory Visit (HOSPITAL_COMMUNITY)
Admission: RE | Admit: 2017-03-17 | Discharge: 2017-03-17 | Disposition: A | Discharge status: Home / Self Care | Payer: PPO | Source: Ambulatory Visit | Attending: Pulmonary Disease | Admitting: Pulmonary Disease

## 2017-03-17 ENCOUNTER — Ambulatory Visit (HOSPITAL_COMMUNITY)
Admission: RE | Admit: 2017-03-17 | Discharge: 2017-03-17 | Disposition: A | Discharge status: Home / Self Care | Payer: PPO | Source: Ambulatory Visit | Attending: Nurse Practitioner | Admitting: Nurse Practitioner

## 2017-03-17 DIAGNOSIS — Z853 Personal history of malignant neoplasm of breast: Secondary | ICD-10-CM | POA: Diagnosis not present

## 2017-03-17 DIAGNOSIS — Z923 Personal history of irradiation: Secondary | ICD-10-CM | POA: Diagnosis not present

## 2017-03-17 DIAGNOSIS — I10 Essential (primary) hypertension: Secondary | ICD-10-CM | POA: Diagnosis not present

## 2017-03-17 DIAGNOSIS — G629 Polyneuropathy, unspecified: Secondary | ICD-10-CM | POA: Insufficient documentation

## 2017-03-17 DIAGNOSIS — E669 Obesity, unspecified: Secondary | ICD-10-CM | POA: Diagnosis not present

## 2017-03-17 DIAGNOSIS — K219 Gastro-esophageal reflux disease without esophagitis: Secondary | ICD-10-CM | POA: Insufficient documentation

## 2017-03-17 DIAGNOSIS — J449 Chronic obstructive pulmonary disease, unspecified: Secondary | ICD-10-CM | POA: Diagnosis not present

## 2017-03-17 DIAGNOSIS — Z9981 Dependence on supplemental oxygen: Secondary | ICD-10-CM | POA: Diagnosis not present

## 2017-03-17 DIAGNOSIS — C787 Secondary malignant neoplasm of liver and intrahepatic bile duct: Secondary | ICD-10-CM | POA: Insufficient documentation

## 2017-03-17 DIAGNOSIS — C801 Malignant (primary) neoplasm, unspecified: Secondary | ICD-10-CM

## 2017-03-17 DIAGNOSIS — E785 Hyperlipidemia, unspecified: Secondary | ICD-10-CM | POA: Diagnosis not present

## 2017-03-17 DIAGNOSIS — Z9221 Personal history of antineoplastic chemotherapy: Secondary | ICD-10-CM | POA: Insufficient documentation

## 2017-03-17 DIAGNOSIS — Z6838 Body mass index (BMI) 38.0-38.9, adult: Secondary | ICD-10-CM | POA: Insufficient documentation

## 2017-03-17 DIAGNOSIS — Z8673 Personal history of transient ischemic attack (TIA), and cerebral infarction without residual deficits: Secondary | ICD-10-CM | POA: Diagnosis not present

## 2017-03-17 DIAGNOSIS — Z902 Acquired absence of lung [part of]: Secondary | ICD-10-CM | POA: Insufficient documentation

## 2017-03-17 DIAGNOSIS — Z85118 Personal history of other malignant neoplasm of bronchus and lung: Secondary | ICD-10-CM | POA: Diagnosis not present

## 2017-03-17 DIAGNOSIS — I4891 Unspecified atrial fibrillation: Secondary | ICD-10-CM | POA: Diagnosis not present

## 2017-03-17 DIAGNOSIS — Z7901 Long term (current) use of anticoagulants: Secondary | ICD-10-CM | POA: Insufficient documentation

## 2017-03-17 DIAGNOSIS — Z5111 Encounter for antineoplastic chemotherapy: Secondary | ICD-10-CM | POA: Diagnosis not present

## 2017-03-17 DIAGNOSIS — C229 Malignant neoplasm of liver, not specified as primary or secondary: Secondary | ICD-10-CM

## 2017-03-17 HISTORY — PX: IR GENERIC HISTORICAL: IMG1180011

## 2017-03-17 LAB — CBC
HCT: 43.6 % (ref 36.0–46.0)
Hemoglobin: 13.7 g/dL (ref 12.0–15.0)
MCH: 26.6 pg (ref 26.0–34.0)
MCHC: 31.4 g/dL (ref 30.0–36.0)
MCV: 84.5 fL (ref 78.0–100.0)
PLATELETS: 181 10*3/uL (ref 150–400)
RBC: 5.16 MIL/uL — ABNORMAL HIGH (ref 3.87–5.11)
RDW: 14.4 % (ref 11.5–15.5)
WBC: 6.3 10*3/uL (ref 4.0–10.5)

## 2017-03-17 LAB — BASIC METABOLIC PANEL
Anion gap: 9 (ref 5–15)
BUN: 42 mg/dL — AB (ref 6–20)
CHLORIDE: 109 mmol/L (ref 101–111)
CO2: 25 mmol/L (ref 22–32)
CREATININE: 1.71 mg/dL — AB (ref 0.44–1.00)
Calcium: 9.6 mg/dL (ref 8.9–10.3)
GFR calc Af Amer: 33 mL/min — ABNORMAL LOW (ref >60)
GFR calc non Af Amer: 28 mL/min — ABNORMAL LOW (ref >60)
Glucose, Bld: 95 mg/dL (ref 65–99)
Potassium: 4.4 mmol/L (ref 3.5–5.1)
SODIUM: 143 mmol/L (ref 135–145)

## 2017-03-17 LAB — PROTIME-INR
INR: 1.27
Prothrombin Time: 16 seconds — ABNORMAL HIGH (ref 11.4–15.2)

## 2017-03-17 LAB — APTT: aPTT: 34 seconds (ref 24–36)

## 2017-03-17 MED ORDER — LIDOCAINE HCL 1 % IJ SOLN
INTRAMUSCULAR | Status: AC
  Filled 2017-03-17: qty 20

## 2017-03-17 MED ORDER — HEPARIN SOD (PORK) LOCK FLUSH 100 UNIT/ML IV SOLN
INTRAVENOUS | Status: AC
  Filled 2017-03-17: qty 5

## 2017-03-17 MED ORDER — FENTANYL CITRATE (PF) 100 MCG/2ML IJ SOLN
INTRAMUSCULAR | Status: AC | PRN
Start: 2017-03-17 — End: 2017-03-17
  Administered 2017-03-17 (×2): 50 ug via INTRAVENOUS

## 2017-03-17 MED ORDER — MIDAZOLAM HCL 2 MG/2ML IJ SOLN
INTRAMUSCULAR | Status: AC | PRN
  Administered 2017-03-17: 1 mg via INTRAVENOUS
  Administered 2017-03-17: 0.5 mg via INTRAVENOUS
  Administered 2017-03-17 (×2): 1 mg via INTRAVENOUS

## 2017-03-17 MED ORDER — LIDOCAINE-EPINEPHRINE (PF) 2 %-1:200000 IJ SOLN
INTRAMUSCULAR | Status: AC | PRN
  Administered 2017-03-17: 10 mL

## 2017-03-17 MED ORDER — DEXTROSE 5 % IV SOLN
3.0000 g | INTRAVENOUS | Status: AC
  Administered 2017-03-17: 3 g via INTRAVENOUS
  Filled 2017-03-17: qty 3

## 2017-03-17 MED ORDER — LIDOCAINE-EPINEPHRINE (PF) 2 %-1:200000 IJ SOLN
INTRAMUSCULAR | Status: AC
  Filled 2017-03-17: qty 20

## 2017-03-17 MED ORDER — SODIUM CHLORIDE 0.9 % IV SOLN
INTRAVENOUS | Status: DC
  Administered 2017-03-17: 13:00:00 via INTRAVENOUS

## 2017-03-17 MED ORDER — MIDAZOLAM HCL 2 MG/2ML IJ SOLN
INTRAMUSCULAR | Status: AC
  Filled 2017-03-17: qty 6

## 2017-03-17 MED ORDER — CEFAZOLIN SODIUM-DEXTROSE 2-4 GM/100ML-% IV SOLN
INTRAVENOUS | Status: AC
  Filled 2017-03-17: qty 100

## 2017-03-17 MED ORDER — HEPARIN SOD (PORK) LOCK FLUSH 100 UNIT/ML IV SOLN
INTRAVENOUS | Status: AC | PRN
  Administered 2017-03-17: 500 [IU]

## 2017-03-17 MED ORDER — LIDOCAINE HCL 1 % IJ SOLN
INTRAMUSCULAR | Status: AC | PRN
  Administered 2017-03-17: 10 mL

## 2017-03-17 MED ORDER — FENTANYL CITRATE (PF) 100 MCG/2ML IJ SOLN
INTRAMUSCULAR | Status: AC
  Filled 2017-03-17: qty 4
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
## 2017-03-17 NOTE — Discharge Instructions (Signed)
Moderate Conscious Sedation, Adult, Care After °These instructions provide you with information about caring for yourself after your procedure. Your health care provider may also give you more specific instructions. Your treatment has been planned according to current medical practices, but problems sometimes occur. Call your health care provider if you have any problems or questions after your procedure. °What can I expect after the procedure? °After your procedure, it is common: °· To feel sleepy for several hours. °· To feel clumsy and have poor balance for several hours. °· To have poor judgment for several hours. °· To vomit if you eat too soon. °Follow these instructions at home: °For at least 24 hours after the procedure:  ° °· Do not: °¨ Participate in activities where you could fall or become injured. °¨ Drive. °¨ Use heavy machinery. °¨ Drink alcohol. °¨ Take sleeping pills or medicines that cause drowsiness. °¨ Make important decisions or sign legal documents. °¨ Take care of children on your own. °· Rest. °Eating and drinking  °· Follow the diet recommended by your health care provider. °· If you vomit: °¨ Drink water, juice, or soup when you can drink without vomiting. °¨ Make sure you have little or no nausea before eating solid foods. °General instructions  °· Have a responsible adult stay with you until you are awake and alert. °· Take over-the-counter and prescription medicines only as told by your health care provider. °· If you smoke, do not smoke without supervision. °· Keep all follow-up visits as told by your health care provider. This is important. °Contact a health care provider if: °· You keep feeling nauseous or you keep vomiting. °· You feel light-headed. °· You develop a rash. °· You have a fever. °Get help right away if: °· You have trouble breathing. °This information is not intended to replace advice given to you by your health care provider. Make sure you discuss any questions you  have with your health care provider. °Document Released: 10/03/2013 Document Revised: 05/17/2016 Document Reviewed: 04/03/2016 °Elsevier Interactive Patient Education © 2017 Elsevier Inc. ° ° °Implanted Port Insertion, Care After °This sheet gives you information about how to care for yourself after your procedure. Your health care provider may also give you more specific instructions. If you have problems or questions, contact your health care provider. °What can I expect after the procedure? °After your procedure, it is common to have: °· Discomfort at the port insertion site. °· Bruising on the skin over the port. This should improve over 3-4 days. °Follow these instructions at home: °Port care  °· After your port is placed, you will get a manufacturer's information card. The card has information about your port. Keep this card with you at all times. °· Take care of the port as told by your health care provider. Ask your health care provider if you or a family member can get training for taking care of the port at home. A home health care nurse may also take care of the port. °· Make sure to remember what type of port you have. °Incision care  °· Follow instructions from your health care provider about how to take care of your port insertion site. Make sure you: °¨ Wash your hands with soap and water before you change your bandage (dressing). If soap and water are not available, use hand sanitizer. °¨ Change your dressing as told by your health care provider. °¨ Leave stitches (sutures), skin glue, or adhesive strips in place. These skin   closures may need to stay in place for 2 weeks or longer. If adhesive strip edges start to loosen and curl up, you may trim the loose edges. Do not remove adhesive strips completely unless your health care provider tells you to do that. °· Check your port insertion site every day for signs of infection. Check for: °¨ More redness, swelling, or pain. °¨ More fluid or  blood. °¨ Warmth. °¨ Pus or a bad smell. °General instructions  °· Do not take baths, swim, or use a hot tub until your health care provider approves. °· Do not lift anything that is heavier than 10 lb (4.5 kg) for a week, or as told by your health care provider. °· Ask your health care provider when it is okay to: °¨ Return to work or school. °¨ Resume usual physical activities or sports. °· Do not drive for 24 hours if you were given a medicine to help you relax (sedative). °· Take over-the-counter and prescription medicines only as told by your health care provider. °· Wear a medical alert bracelet in case of an emergency. This will tell any health care providers that you have a port. °· Keep all follow-up visits as told by your health care provider. This is important. °Contact a health care provider if: °· You cannot flush your port with saline as directed, or you cannot draw blood from the port. °· You have a fever or chills. °· You have more redness, swelling, or pain around your port insertion site. °· You have more fluid or blood coming from your port insertion site. °· Your port insertion site feels warm to the touch. °· You have pus or a bad smell coming from the port insertion site. °Get help right away if: °· You have chest pain or shortness of breath. °· You have bleeding from your port that you cannot control. °Summary °· Take care of the port as told by your health care provider. °· Change your dressing as told by your health care provider. °· Keep all follow-up visits as told by your health care provider. °This information is not intended to replace advice given to you by your health care provider. Make sure you discuss any questions you have with your health care provider. °Document Released: 10/03/2013 Document Revised: 11/03/2016 Document Reviewed: 11/03/2016 °Elsevier Interactive Patient Education © 2017 Elsevier Inc. ° °

## 2017-03-17 NOTE — Sedation Documentation (Signed)
Patient is resting comfortably. 

## 2017-03-17 NOTE — Sedation Documentation (Signed)
Patient denies pain and is resting comfortably.  

## 2017-03-17 NOTE — Procedures (Signed)
adenoca of the liver  S/P RT JI POWER PORT  Tip svcra No comp Stable EBL 0 Full report in PACS

## 2017-03-18 ENCOUNTER — Telehealth: Payer: Self-pay | Admitting: Internal Medicine

## 2017-03-18 NOTE — Telephone Encounter (Signed)
Left a message on voice mail about appointment and the number to call if here were any questions or concerns

## 2017-03-20 DIAGNOSIS — J449 Chronic obstructive pulmonary disease, unspecified: Secondary | ICD-10-CM | POA: Diagnosis not present

## 2017-03-21 ENCOUNTER — Encounter: Payer: Self-pay | Admitting: Nurse Practitioner

## 2017-03-21 ENCOUNTER — Other Ambulatory Visit: Payer: PPO

## 2017-03-21 ENCOUNTER — Ambulatory Visit (HOSPITAL_COMMUNITY): Payer: PPO

## 2017-03-21 ENCOUNTER — Ambulatory Visit (INDEPENDENT_AMBULATORY_CARE_PROVIDER_SITE_OTHER): Payer: PPO | Admitting: Nurse Practitioner

## 2017-03-21 VITALS — BP 135/73 | HR 81 | Temp 97.0°F | Ht 71.0 in | Wt 273.4 lb

## 2017-03-21 DIAGNOSIS — L231 Allergic contact dermatitis due to adhesives: Secondary | ICD-10-CM | POA: Diagnosis not present

## 2017-03-21 NOTE — Patient Instructions (Signed)
   Contact Dermatitis Dermatitis is redness, soreness, and swelling (inflammation) of the skin. Contact dermatitis is a reaction to certain substances that touch the skin. You either touched something that irritated your skin, or you have allergies to something you touched. Follow these instructions at home: Skin Care   Moisturize your skin as needed.  Apply cool compresses to the affected areas.  Try taking a bath with:  Epsom salts. Follow the instructions on the package. You can get these at a pharmacy or grocery store.  Baking soda. Pour a small amount into the bath as told by your doctor.  Colloidal oatmeal. Follow the instructions on the package. You can get this at a pharmacy or grocery store.  Try applying baking soda paste to your skin. Stir water into baking soda until it looks like paste.  Do not scratch your skin.  Bathe less often.  Bathe in lukewarm water. Avoid using hot water. Medicines   Take or apply over-the-counter and prescription medicines only as told by your doctor.  If you were prescribed an antibiotic medicine, take or apply your antibiotic as told by your doctor. Do not stop taking the antibiotic even if your condition starts to get better. General instructions   Keep all follow-up visits as told by your doctor. This is important.  Avoid the substance that caused your reaction. If you do not know what caused it, keep a journal to try to track what caused it. Write down:  What you eat.  What cosmetic products you use.  What you drink.  What you wear in the affected area. This includes jewelry.  If you were given a bandage (dressing), take care of it as told by your doctor. This includes when to change and remove it. Contact a doctor if:  You do not get better with treatment.  Your condition gets worse.  You have signs of infection such as:  Swelling.  Tenderness.  Redness.  Soreness.  Warmth.  You have a fever.  You have new  symptoms. Get help right away if:  You have a very bad headache.  You have neck pain.  Your neck is stiff.  You throw up (vomit).  You feel very sleepy.  You see red streaks coming from the affected area.  Your bone or joint underneath the affected area becomes painful after the skin has healed.  The affected area turns darker.  You have trouble breathing. This information is not intended to replace advice given to you by your health care provider. Make sure you discuss any questions you have with your health care provider. Document Released: 10/10/2009 Document Revised: 05/20/2016 Document Reviewed: 04/30/2015 Elsevier Interactive Patient Education  2017 Elsevier Inc.  

## 2017-03-21 NOTE — Progress Notes (Signed)
   Subjective:    Patient ID: Tammie [name], female    DOB: 1941/07/29, 76 y.o.   MRN: [id number]  HPI: Port placed on Thursday in Right Clavicular and Tegaderm used for dressing. Patient arrives today with blistering and erythema around tegaderm.  Review of Systems  Constitutional: Negative.   Respiratory: Negative.   Cardiovascular: Negative.   Skin:       Blistering and erythema around Tegaderm   Allergic/Immunologic:       Tape  Psychiatric/Behavioral: Negative.        Objective:   Physical Exam  Constitutional: She is oriented to person, place, and time. She appears well-developed and well-nourished.  Cardiovascular: Normal rate and regular rhythm.   Pulmonary/Chest: Effort normal and breath sounds normal.  Neurological: She is oriented to person, place, and time.  Skin: There is erythema.  3cm blister to right lower breast where tape from dressing was.  Incision from port placement (right clavicular) is clean, dry, intact, and healing appropriately.   Psychiatric: She has a normal mood and affect. Her behavior is normal. Judgment and thought content normal.   BP 135/73   Pulse 81   Temp 97 F (36.1 C) (Oral)   Ht '5\' 11"'$  (1.803 m)   Wt 273 lb 6.4 oz (124 kg)   BMI 38.13 kg/m    Procedure: betadine applied topically   I & D of bullaud w/ pick up and steven scissors, drained clear fluid.   Triple antibiotic ointment applied over wound and dressed with sterile  guaze and paper tape.      Assessment & Plan:   1. Allergic contact dermatitis due to adhesives    -Avoid adhesives -Use paper tape when warranted  Dionisio David, FNP student [place], FNP

## 2017-03-23 ENCOUNTER — Other Ambulatory Visit: Payer: Self-pay | Admitting: Pharmacist Clinician (PhC)/ Clinical Pharmacy Specialist

## 2017-03-23 ENCOUNTER — Other Ambulatory Visit: Payer: Self-pay | Admitting: Pediatrics

## 2017-03-23 DIAGNOSIS — I10 Essential (primary) hypertension: Secondary | ICD-10-CM

## 2017-03-23 DIAGNOSIS — R609 Edema, unspecified: Secondary | ICD-10-CM

## 2017-03-24 ENCOUNTER — Ambulatory Visit (HOSPITAL_BASED_OUTPATIENT_CLINIC_OR_DEPARTMENT_OTHER): Payer: PPO | Admitting: Internal Medicine

## 2017-03-24 ENCOUNTER — Other Ambulatory Visit (HOSPITAL_BASED_OUTPATIENT_CLINIC_OR_DEPARTMENT_OTHER): Payer: PPO

## 2017-03-24 ENCOUNTER — Encounter: Payer: Self-pay | Admitting: Internal Medicine

## 2017-03-24 ENCOUNTER — Ambulatory Visit (HOSPITAL_BASED_OUTPATIENT_CLINIC_OR_DEPARTMENT_OTHER): Payer: PPO

## 2017-03-24 VITALS — BP 158/70 | HR 79 | Temp 97.8°F | Resp 19 | Wt 274.9 lb

## 2017-03-24 DIAGNOSIS — C787 Secondary malignant neoplasm of liver and intrahepatic bile duct: Secondary | ICD-10-CM

## 2017-03-24 DIAGNOSIS — C229 Malignant neoplasm of liver, not specified as primary or secondary: Secondary | ICD-10-CM

## 2017-03-24 DIAGNOSIS — Z5112 Encounter for antineoplastic immunotherapy: Secondary | ICD-10-CM

## 2017-03-24 DIAGNOSIS — Z79899 Other long term (current) drug therapy: Secondary | ICD-10-CM

## 2017-03-24 DIAGNOSIS — C801 Malignant (primary) neoplasm, unspecified: Secondary | ICD-10-CM

## 2017-03-24 DIAGNOSIS — C3431 Malignant neoplasm of lower lobe, right bronchus or lung: Secondary | ICD-10-CM

## 2017-03-24 LAB — TSH: TSH: 2.592 m[IU]/L (ref 0.308–3.960)

## 2017-03-24 LAB — CBC WITH DIFFERENTIAL/PLATELET
BASO%: 1.3 % (ref 0.0–2.0)
BASOS ABS: 0.1 10*3/uL (ref 0.0–0.1)
EOS ABS: 0.1 10*3/uL (ref 0.0–0.5)
EOS%: 2.6 % (ref 0.0–7.0)
HEMATOCRIT: 39.8 % (ref 34.8–46.6)
HGB: 12.8 g/dL (ref 11.6–15.9)
LYMPH#: 0.8 10*3/uL — AB (ref 0.9–3.3)
LYMPH%: 13.8 % — ABNORMAL LOW (ref 14.0–49.7)
MCH: 26.7 pg (ref 25.1–34.0)
MCHC: 32.1 g/dL (ref 31.5–36.0)
MCV: 83.2 fL (ref 79.5–101.0)
MONO#: 0.3 10*3/uL (ref 0.1–0.9)
MONO%: 5.6 % (ref 0.0–14.0)
NEUT%: 76.7 % (ref 38.4–76.8)
NEUTROS ABS: 4.2 10*3/uL (ref 1.5–6.5)
Platelets: 169 10*3/uL (ref 145–400)
RBC: 4.79 10*6/uL (ref 3.70–5.45)
RDW: 14.9 % — AB (ref 11.2–14.5)
WBC: 5.5 10*3/uL (ref 3.9–10.3)

## 2017-03-24 LAB — COMPREHENSIVE METABOLIC PANEL
ALBUMIN: 3.5 g/dL (ref 3.5–5.0)
ALK PHOS: 70 U/L (ref 40–150)
ALT: 10 U/L (ref 0–55)
AST: 15 U/L (ref 5–34)
Anion Gap: 7 mEq/L (ref 3–11)
BILIRUBIN TOTAL: 0.41 mg/dL (ref 0.20–1.20)
BUN: 31.7 mg/dL — ABNORMAL HIGH (ref 7.0–26.0)
CALCIUM: 9.3 mg/dL (ref 8.4–10.4)
CO2: 28 mEq/L (ref 22–29)
CREATININE: 1.6 mg/dL — AB (ref 0.6–1.1)
Chloride: 108 mEq/L (ref 98–109)
EGFR: 30 mL/min/{1.73_m2} — ABNORMAL LOW (ref >90)
Glucose: 117 mg/dl (ref 70–140)
Potassium: 4.4 mEq/L (ref 3.5–5.1)
Sodium: 143 mEq/L (ref 136–145)
TOTAL PROTEIN: 6.8 g/dL (ref 6.4–8.3)

## 2017-03-24 MED ORDER — SODIUM CHLORIDE 0.9 % IV SOLN
200.0000 mg | Freq: Once | INTRAVENOUS | Status: AC
  Administered 2017-03-24: 200 mg via INTRAVENOUS
  Filled 2017-03-24: qty 8

## 2017-03-24 MED ORDER — SODIUM CHLORIDE 0.9% FLUSH
10.0000 mL | INTRAVENOUS | Status: DC | PRN
  Administered 2017-03-24: 10 mL
  Filled 2017-03-24: qty 10

## 2017-03-24 MED ORDER — SODIUM CHLORIDE 0.9 % IV SOLN: Freq: Once | INTRAVENOUS | Status: DC

## 2017-03-24 MED ORDER — HEPARIN SOD (PORK) LOCK FLUSH 100 UNIT/ML IV SOLN
500.0000 [IU] | Freq: Once | INTRAVENOUS | Status: AC | PRN
  Administered 2017-03-24: 500 [IU]
  Filled 2017-03-24: qty 5
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
## 2017-03-24 NOTE — Patient Instructions (Signed)
[facility] Discharge Instructions for Patients Receiving Chemotherapy  Today you received the following chemotherapy agents:  Keytruda.  To help prevent nausea and vomiting after your treatment, we encourage you to take your nausea medication as prescribed.   If you develop nausea and vomiting that is not controlled by your nausea medication, call the clinic.   BELOW ARE SYMPTOMS THAT SHOULD BE REPORTED IMMEDIATELY:  *FEVER GREATER THAN 100.5 F  *CHILLS WITH OR WITHOUT FEVER  NAUSEA AND VOMITING THAT IS NOT CONTROLLED WITH YOUR NAUSEA MEDICATION  *UNUSUAL SHORTNESS OF BREATH  *UNUSUAL BRUISING OR BLEEDING  TENDERNESS IN MOUTH AND THROAT WITH OR WITHOUT PRESENCE OF ULCERS  *URINARY PROBLEMS  *BOWEL PROBLEMS  UNUSUAL RASH Items with * indicate a potential emergency and should be followed up as soon as possible.  Feel free to call the clinic you have any questions or concerns. The clinic phone number is [phone].  Please show the CHEMO ALERT CARD at check-in to the Emergency Department and triage nurse.   

## 2017-03-24 NOTE — Progress Notes (Signed)
Per Dr Julien Nordmann, [place] to treat with CRT of 1.6

## 2017-03-25 ENCOUNTER — Telehealth: Payer: Self-pay | Admitting: Internal Medicine

## 2017-03-25 NOTE — Telephone Encounter (Signed)
Scheduled appts per 03/24/2017 los. Patient aware of new appts added.

## 2017-03-29 ENCOUNTER — Telehealth: Payer: Self-pay | Admitting: Pediatrics

## 2017-03-29 NOTE — Telephone Encounter (Signed)
Patient called stating that since starting chemo that BP has been in the 150s and think that medication needs to be increased.  Patient has an appt to see tammy tomorrow 04/04.  Will Check BP then

## 2017-03-30 ENCOUNTER — Ambulatory Visit (INDEPENDENT_AMBULATORY_CARE_PROVIDER_SITE_OTHER): Payer: PPO | Admitting: Pharmacist

## 2017-03-30 DIAGNOSIS — I4891 Unspecified atrial fibrillation: Secondary | ICD-10-CM

## 2017-03-30 DIAGNOSIS — Z7901 Long term (current) use of anticoagulants: Secondary | ICD-10-CM

## 2017-03-30 LAB — COAGUCHEK XS/INR WAIVED
INR: 1.5 — ABNORMAL HIGH (ref 0.9–1.1)
PROTHROMBIN TIME: 18.4 s

## 2017-04-07 ENCOUNTER — Ambulatory Visit (INDEPENDENT_AMBULATORY_CARE_PROVIDER_SITE_OTHER): Payer: PPO | Admitting: Pharmacist

## 2017-04-07 DIAGNOSIS — Z7901 Long term (current) use of anticoagulants: Secondary | ICD-10-CM

## 2017-04-07 DIAGNOSIS — I4891 Unspecified atrial fibrillation: Secondary | ICD-10-CM

## 2017-04-07 LAB — COAGUCHEK XS/INR WAIVED
INR: 2.5 — AB (ref 0.9–1.1)
PROTHROMBIN TIME: 29.8 s

## 2017-04-14 ENCOUNTER — Other Ambulatory Visit (HOSPITAL_BASED_OUTPATIENT_CLINIC_OR_DEPARTMENT_OTHER): Payer: PPO

## 2017-04-14 ENCOUNTER — Ambulatory Visit (HOSPITAL_BASED_OUTPATIENT_CLINIC_OR_DEPARTMENT_OTHER): Payer: PPO

## 2017-04-14 ENCOUNTER — Ambulatory Visit (HOSPITAL_BASED_OUTPATIENT_CLINIC_OR_DEPARTMENT_OTHER): Payer: PPO | Admitting: Internal Medicine

## 2017-04-14 ENCOUNTER — Telehealth: Payer: Self-pay | Admitting: Internal Medicine

## 2017-04-14 ENCOUNTER — Encounter: Payer: Self-pay | Admitting: Internal Medicine

## 2017-04-14 VITALS — BP 146/78 | HR 74 | Temp 98.1°F | Resp 18 | Ht 71.0 in | Wt 273.3 lb

## 2017-04-14 DIAGNOSIS — C801 Malignant (primary) neoplasm, unspecified: Secondary | ICD-10-CM

## 2017-04-14 DIAGNOSIS — C229 Malignant neoplasm of liver, not specified as primary or secondary: Secondary | ICD-10-CM

## 2017-04-14 DIAGNOSIS — Z5112 Encounter for antineoplastic immunotherapy: Secondary | ICD-10-CM

## 2017-04-14 DIAGNOSIS — C3431 Malignant neoplasm of lower lobe, right bronchus or lung: Secondary | ICD-10-CM

## 2017-04-14 DIAGNOSIS — Z79899 Other long term (current) drug therapy: Secondary | ICD-10-CM | POA: Diagnosis not present

## 2017-04-14 DIAGNOSIS — N289 Disorder of kidney and ureter, unspecified: Secondary | ICD-10-CM

## 2017-04-14 DIAGNOSIS — C787 Secondary malignant neoplasm of liver and intrahepatic bile duct: Secondary | ICD-10-CM

## 2017-04-14 LAB — COMPREHENSIVE METABOLIC PANEL
ALBUMIN: 3.7 g/dL (ref 3.5–5.0)
ALK PHOS: 65 U/L (ref 40–150)
ALT: 11 U/L (ref 0–55)
ANION GAP: 10 meq/L (ref 3–11)
AST: 14 U/L (ref 5–34)
BILIRUBIN TOTAL: 0.52 mg/dL (ref 0.20–1.20)
BUN: 33.4 mg/dL — ABNORMAL HIGH (ref 7.0–26.0)
CO2: 29 mEq/L (ref 22–29)
Calcium: 9.7 mg/dL (ref 8.4–10.4)
Chloride: 106 mEq/L (ref 98–109)
Creatinine: 1.8 mg/dL — ABNORMAL HIGH (ref 0.6–1.1)
EGFR: 27 mL/min/{1.73_m2} — AB (ref >90)
GLUCOSE: 125 mg/dL (ref 70–140)
POTASSIUM: 4.6 meq/L (ref 3.5–5.1)
SODIUM: 146 meq/L — AB (ref 136–145)
Total Protein: 6.9 g/dL (ref 6.4–8.3)

## 2017-04-14 LAB — CBC WITH DIFFERENTIAL/PLATELET
BASO%: 1.2 % (ref 0.0–2.0)
BASOS ABS: 0.1 10*3/uL (ref 0.0–0.1)
EOS ABS: 0.1 10*3/uL (ref 0.0–0.5)
EOS%: 2.4 % (ref 0.0–7.0)
HEMATOCRIT: 41.9 % (ref 34.8–46.6)
HEMOGLOBIN: 13.5 g/dL (ref 11.6–15.9)
LYMPH%: 14.9 % (ref 14.0–49.7)
MCH: 26.5 pg (ref 25.1–34.0)
MCHC: 32.2 g/dL (ref 31.5–36.0)
MCV: 82.2 fL (ref 79.5–101.0)
MONO#: 0.4 10*3/uL (ref 0.1–0.9)
MONO%: 6.7 % (ref 0.0–14.0)
NEUT%: 74.8 % (ref 38.4–76.8)
NEUTROS ABS: 4.2 10*3/uL (ref 1.5–6.5)
Platelets: 183 10*3/uL (ref 145–400)
RBC: 5.1 10*6/uL (ref 3.70–5.45)
RDW: 15 % — AB (ref 11.2–14.5)
WBC: 5.6 10*3/uL (ref 3.9–10.3)
lymph#: 0.8 10*3/uL — ABNORMAL LOW (ref 0.9–3.3)

## 2017-04-14 LAB — TSH: TSH: 2.198 m[IU]/L (ref 0.308–3.960)

## 2017-04-14 MED ORDER — SODIUM CHLORIDE 0.9 % IV SOLN
Freq: Once | INTRAVENOUS | Status: AC
  Administered 2017-04-14: 13:00:00 via INTRAVENOUS

## 2017-04-14 MED ORDER — HEPARIN SOD (PORK) LOCK FLUSH 100 UNIT/ML IV SOLN
500.0000 [IU] | Freq: Once | INTRAVENOUS | Status: AC | PRN
  Administered 2017-04-14: 500 [IU]
  Filled 2017-04-14: qty 5

## 2017-04-14 MED ORDER — PEMBROLIZUMAB CHEMO INJECTION 100 MG/4ML
200.0000 mg | Freq: Once | INTRAVENOUS | Status: AC
  Administered 2017-04-14: 200 mg via INTRAVENOUS
  Filled 2017-04-14: qty 8

## 2017-04-14 MED ORDER — SODIUM CHLORIDE 0.9% FLUSH
10.0000 mL | INTRAVENOUS | Status: DC | PRN
  Administered 2017-04-14: 10 mL
  Filled 2017-04-14: qty 10

## 2017-04-14 NOTE — Patient Instructions (Signed)
[facility] Discharge Instructions for Patients Receiving Chemotherapy  Today you received the following chemotherapy agents: Keytruda   To help prevent nausea and vomiting after your treatment, we encourage you to take your nausea medication as directed    If you develop nausea and vomiting that is not controlled by your nausea medication, call the clinic.   BELOW ARE SYMPTOMS THAT SHOULD BE REPORTED IMMEDIATELY:  *FEVER GREATER THAN 100.5 F  *CHILLS WITH OR WITHOUT FEVER  NAUSEA AND VOMITING THAT IS NOT CONTROLLED WITH YOUR NAUSEA MEDICATION  *UNUSUAL SHORTNESS OF BREATH  *UNUSUAL BRUISING OR BLEEDING  TENDERNESS IN MOUTH AND THROAT WITH OR WITHOUT PRESENCE OF ULCERS  *URINARY PROBLEMS  *BOWEL PROBLEMS  UNUSUAL RASH Items with * indicate a potential emergency and should be followed up as soon as possible.  Feel free to call the clinic you have any questions or concerns. The clinic phone number is [phone].  Please show the CHEMO ALERT CARD at check-in to the Emergency Department and triage nurse.   
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
## 2017-04-14 NOTE — Telephone Encounter (Signed)
Appointments already scheduled per treatment plan.

## 2017-04-14 NOTE — Progress Notes (Signed)
Sodium 146, Creatinine 1.8, okay to proceed with treatment per [name].

## 2017-05-05 ENCOUNTER — Telehealth: Payer: Self-pay | Admitting: Internal Medicine

## 2017-05-05 ENCOUNTER — Encounter (HOSPITAL_COMMUNITY): Payer: Self-pay

## 2017-05-05 ENCOUNTER — Other Ambulatory Visit: Payer: Self-pay | Admitting: Pharmacist

## 2017-05-05 ENCOUNTER — Ambulatory Visit (HOSPITAL_BASED_OUTPATIENT_CLINIC_OR_DEPARTMENT_OTHER): Payer: PPO

## 2017-05-05 ENCOUNTER — Ambulatory Visit (HOSPITAL_COMMUNITY)
Admission: RE | Admit: 2017-05-05 | Discharge: 2017-05-05 | Disposition: A | Discharge status: Home / Self Care | Payer: PPO | Source: Ambulatory Visit | Attending: Internal Medicine | Admitting: Internal Medicine

## 2017-05-05 ENCOUNTER — Ambulatory Visit (HOSPITAL_BASED_OUTPATIENT_CLINIC_OR_DEPARTMENT_OTHER): Payer: PPO | Admitting: Internal Medicine

## 2017-05-05 ENCOUNTER — Other Ambulatory Visit (HOSPITAL_BASED_OUTPATIENT_CLINIC_OR_DEPARTMENT_OTHER): Payer: PPO

## 2017-05-05 VITALS — BP 167/86 | HR 70 | Temp 99.2°F | Resp 18 | Ht 71.0 in | Wt 272.6 lb

## 2017-05-05 DIAGNOSIS — Z5112 Encounter for antineoplastic immunotherapy: Secondary | ICD-10-CM

## 2017-05-05 DIAGNOSIS — N200 Calculus of kidney: Secondary | ICD-10-CM | POA: Diagnosis not present

## 2017-05-05 DIAGNOSIS — N281 Cyst of kidney, acquired: Secondary | ICD-10-CM | POA: Insufficient documentation

## 2017-05-05 DIAGNOSIS — N289 Disorder of kidney and ureter, unspecified: Secondary | ICD-10-CM | POA: Insufficient documentation

## 2017-05-05 DIAGNOSIS — C801 Malignant (primary) neoplasm, unspecified: Secondary | ICD-10-CM

## 2017-05-05 DIAGNOSIS — C3431 Malignant neoplasm of lower lobe, right bronchus or lung: Secondary | ICD-10-CM | POA: Diagnosis not present

## 2017-05-05 DIAGNOSIS — C229 Malignant neoplasm of liver, not specified as primary or secondary: Secondary | ICD-10-CM

## 2017-05-05 DIAGNOSIS — I10 Essential (primary) hypertension: Secondary | ICD-10-CM

## 2017-05-05 DIAGNOSIS — I2510 Atherosclerotic heart disease of native coronary artery without angina pectoris: Secondary | ICD-10-CM | POA: Diagnosis not present

## 2017-05-05 DIAGNOSIS — Z7901 Long term (current) use of anticoagulants: Secondary | ICD-10-CM | POA: Diagnosis not present

## 2017-05-05 DIAGNOSIS — I4891 Unspecified atrial fibrillation: Secondary | ICD-10-CM | POA: Diagnosis not present

## 2017-05-05 DIAGNOSIS — C787 Secondary malignant neoplasm of liver and intrahepatic bile duct: Secondary | ICD-10-CM | POA: Diagnosis not present

## 2017-05-05 DIAGNOSIS — K8020 Calculus of gallbladder without cholecystitis without obstruction: Secondary | ICD-10-CM | POA: Insufficient documentation

## 2017-05-05 DIAGNOSIS — R911 Solitary pulmonary nodule: Secondary | ICD-10-CM | POA: Insufficient documentation

## 2017-05-05 LAB — CBC WITH DIFFERENTIAL/PLATELET
BASO%: 0.4 % (ref 0.0–2.0)
BASOS ABS: 0 10*3/uL (ref 0.0–0.1)
EOS%: 2.5 % (ref 0.0–7.0)
Eosinophils Absolute: 0.2 10*3/uL (ref 0.0–0.5)
HCT: 43.2 % (ref 34.8–46.6)
HEMOGLOBIN: 13.5 g/dL (ref 11.6–15.9)
LYMPH%: 17.5 % (ref 14.0–49.7)
MCH: 26.8 pg (ref 25.1–34.0)
MCHC: 31.3 g/dL — ABNORMAL LOW (ref 31.5–36.0)
MCV: 85.7 fL (ref 79.5–101.0)
MONO#: 0.6 10*3/uL (ref 0.1–0.9)
MONO%: 9.1 % (ref 0.0–14.0)
NEUT#: 4.7 10*3/uL (ref 1.5–6.5)
NEUT%: 70.5 % (ref 38.4–76.8)
PLATELETS: 165 10*3/uL (ref 145–400)
RBC: 5.04 10*6/uL (ref 3.70–5.45)
RDW: 14.5 % (ref 11.2–14.5)
WBC: 6.7 10*3/uL (ref 3.9–10.3)
lymph#: 1.2 10*3/uL (ref 0.9–3.3)

## 2017-05-05 LAB — COMPREHENSIVE METABOLIC PANEL
ALBUMIN: 3.7 g/dL (ref 3.5–5.0)
ALK PHOS: 69 U/L (ref 40–150)
ALT: 13 U/L (ref 0–55)
AST: 17 U/L (ref 5–34)
Anion Gap: 9 mEq/L (ref 3–11)
BILIRUBIN TOTAL: 0.47 mg/dL (ref 0.20–1.20)
BUN: 24 mg/dL (ref 7.0–26.0)
CO2: 29 mEq/L (ref 22–29)
Calcium: 9.3 mg/dL (ref 8.4–10.4)
Chloride: 105 mEq/L (ref 98–109)
Creatinine: 1.6 mg/dL — ABNORMAL HIGH (ref 0.6–1.1)
EGFR: 30 mL/min/{1.73_m2} — AB (ref >90)
Glucose: 89 mg/dl (ref 70–140)
POTASSIUM: 5 meq/L (ref 3.5–5.1)
SODIUM: 144 meq/L (ref 136–145)
TOTAL PROTEIN: 7.1 g/dL (ref 6.4–8.3)

## 2017-05-05 LAB — TSH: TSH: 3.172 m[IU]/L (ref 0.308–3.960)

## 2017-05-05 MED ORDER — SODIUM CHLORIDE 0.9 % IV SOLN
200.0000 mg | Freq: Once | INTRAVENOUS | Status: AC
  Administered 2017-05-05: 200 mg via INTRAVENOUS
  Filled 2017-05-05: qty 8

## 2017-05-05 MED ORDER — SODIUM CHLORIDE 0.9% FLUSH
10.0000 mL | INTRAVENOUS | Status: DC | PRN
  Administered 2017-05-05: 10 mL
  Filled 2017-05-05: qty 10

## 2017-05-05 MED ORDER — SODIUM CHLORIDE 0.9 % IV SOLN
Freq: Once | INTRAVENOUS | Status: AC
  Administered 2017-05-05: 15:00:00 via INTRAVENOUS

## 2017-05-05 MED ORDER — HEPARIN SOD (PORK) LOCK FLUSH 100 UNIT/ML IV SOLN
500.0000 [IU] | Freq: Once | INTRAVENOUS | Status: AC | PRN
  Administered 2017-05-05: 500 [IU]
  Filled 2017-05-05: qty 5

## 2017-05-05 NOTE — Telephone Encounter (Signed)
Appointments scheduled per 05/05/17 los. Patient was given a copy of the AVS report and appointment schedule,per 05/05/17 los.
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
## 2017-05-05 NOTE — Progress Notes (Signed)
Per MD ok to treat with 1.6 Cr.   Wylene Simmer, BSN, RN 05/05/2017 2:27 PM

## 2017-05-05 NOTE — Patient Instructions (Signed)
[facility] Discharge Instructions for Patients Receiving Chemotherapy  Today you received the following chemotherapy agents: Keytruda   To help prevent nausea and vomiting after your treatment, we encourage you to take your nausea medication as directed    If you develop nausea and vomiting that is not controlled by your nausea medication, call the clinic.   BELOW ARE SYMPTOMS THAT SHOULD BE REPORTED IMMEDIATELY:  *FEVER GREATER THAN 100.5 F  *CHILLS WITH OR WITHOUT FEVER  NAUSEA AND VOMITING THAT IS NOT CONTROLLED WITH YOUR NAUSEA MEDICATION  *UNUSUAL SHORTNESS OF BREATH  *UNUSUAL BRUISING OR BLEEDING  TENDERNESS IN MOUTH AND THROAT WITH OR WITHOUT PRESENCE OF ULCERS  *URINARY PROBLEMS  *BOWEL PROBLEMS  UNUSUAL RASH Items with * indicate a potential emergency and should be followed up as soon as possible.  Feel free to call the clinic you have any questions or concerns. The clinic phone number is [phone].  Please show the CHEMO ALERT CARD at check-in to the Emergency Department and triage nurse.   

## 2017-05-06 ENCOUNTER — Encounter: Payer: Self-pay | Admitting: Internal Medicine

## 2017-05-06 LAB — PROTIME-INR
INR: 1.4 — ABNORMAL HIGH (ref 0.8–1.2)
Prothrombin Time: 14.7 s — ABNORMAL HIGH (ref 9.1–12.0)

## 2017-05-09 ENCOUNTER — Ambulatory Visit (INDEPENDENT_AMBULATORY_CARE_PROVIDER_SITE_OTHER): Payer: PPO | Admitting: Pharmacist

## 2017-05-09 DIAGNOSIS — I4891 Unspecified atrial fibrillation: Secondary | ICD-10-CM

## 2017-05-26 ENCOUNTER — Ambulatory Visit (HOSPITAL_BASED_OUTPATIENT_CLINIC_OR_DEPARTMENT_OTHER): Payer: PPO | Admitting: Internal Medicine

## 2017-05-26 ENCOUNTER — Encounter: Payer: Self-pay | Admitting: Internal Medicine

## 2017-05-26 ENCOUNTER — Other Ambulatory Visit: Payer: Self-pay | Admitting: Pharmacist

## 2017-05-26 ENCOUNTER — Other Ambulatory Visit (HOSPITAL_BASED_OUTPATIENT_CLINIC_OR_DEPARTMENT_OTHER): Payer: PPO

## 2017-05-26 ENCOUNTER — Ambulatory Visit (HOSPITAL_BASED_OUTPATIENT_CLINIC_OR_DEPARTMENT_OTHER): Payer: PPO

## 2017-05-26 ENCOUNTER — Other Ambulatory Visit: Payer: Self-pay | Admitting: Internal Medicine

## 2017-05-26 VITALS — BP 141/58 | HR 72 | Temp 98.8°F | Resp 18 | Ht 71.0 in | Wt 269.8 lb

## 2017-05-26 DIAGNOSIS — Z85118 Personal history of other malignant neoplasm of bronchus and lung: Secondary | ICD-10-CM

## 2017-05-26 DIAGNOSIS — Z5112 Encounter for antineoplastic immunotherapy: Secondary | ICD-10-CM | POA: Diagnosis not present

## 2017-05-26 DIAGNOSIS — Z79899 Other long term (current) drug therapy: Secondary | ICD-10-CM | POA: Diagnosis not present

## 2017-05-26 DIAGNOSIS — C3431 Malignant neoplasm of lower lobe, right bronchus or lung: Secondary | ICD-10-CM

## 2017-05-26 DIAGNOSIS — C787 Secondary malignant neoplasm of liver and intrahepatic bile duct: Secondary | ICD-10-CM

## 2017-05-26 DIAGNOSIS — C229 Malignant neoplasm of liver, not specified as primary or secondary: Secondary | ICD-10-CM

## 2017-05-26 DIAGNOSIS — C801 Malignant (primary) neoplasm, unspecified: Secondary | ICD-10-CM

## 2017-05-26 LAB — CBC WITH DIFFERENTIAL/PLATELET
BASO%: 0.4 % (ref 0.0–2.0)
Basophils Absolute: 0 10*3/uL (ref 0.0–0.1)
EOS%: 1.3 % (ref 0.0–7.0)
Eosinophils Absolute: 0.1 10*3/uL (ref 0.0–0.5)
HCT: 40.3 % (ref 34.8–46.6)
HGB: 12.8 g/dL (ref 11.6–15.9)
LYMPH%: 8.6 % — AB (ref 14.0–49.7)
MCH: 26.9 pg (ref 25.1–34.0)
MCHC: 31.8 g/dL (ref 31.5–36.0)
MCV: 84.8 fL (ref 79.5–101.0)
MONO#: 0.8 10*3/uL (ref 0.1–0.9)
MONO%: 9.6 % (ref 0.0–14.0)
NEUT%: 80.1 % — ABNORMAL HIGH (ref 38.4–76.8)
NEUTROS ABS: 6.8 10*3/uL — AB (ref 1.5–6.5)
Platelets: 162 10*3/uL (ref 145–400)
RBC: 4.75 10*6/uL (ref 3.70–5.45)
RDW: 15.1 % — ABNORMAL HIGH (ref 11.2–14.5)
WBC: 8.5 10*3/uL (ref 3.9–10.3)
lymph#: 0.7 10*3/uL — ABNORMAL LOW (ref 0.9–3.3)

## 2017-05-26 LAB — COMPREHENSIVE METABOLIC PANEL
ALT: 9 U/L (ref 0–55)
AST: 12 U/L (ref 5–34)
Albumin: 3.4 g/dL — ABNORMAL LOW (ref 3.5–5.0)
Alkaline Phosphatase: 65 U/L (ref 40–150)
Anion Gap: 12 mEq/L — ABNORMAL HIGH (ref 3–11)
BILIRUBIN TOTAL: 0.81 mg/dL (ref 0.20–1.20)
BUN: 34.8 mg/dL — ABNORMAL HIGH (ref 7.0–26.0)
CO2: 25 meq/L (ref 22–29)
Calcium: 9.2 mg/dL (ref 8.4–10.4)
Chloride: 106 mEq/L (ref 98–109)
Creatinine: 1.8 mg/dL — ABNORMAL HIGH (ref 0.6–1.1)
EGFR: 28 mL/min/{1.73_m2} — AB (ref >90)
GLUCOSE: 110 mg/dL (ref 70–140)
Potassium: 4.1 mEq/L (ref 3.5–5.1)
SODIUM: 143 meq/L (ref 136–145)
TOTAL PROTEIN: 7 g/dL (ref 6.4–8.3)

## 2017-05-26 LAB — TSH: TSH: 2.735 m(IU)/L (ref 0.308–3.960)

## 2017-05-26 MED ORDER — SODIUM CHLORIDE 0.9 % IV SOLN
Freq: Once | INTRAVENOUS | Status: AC
  Administered 2017-05-26: 13:00:00 via INTRAVENOUS

## 2017-05-26 MED ORDER — SODIUM CHLORIDE 0.9% FLUSH
10.0000 mL | INTRAVENOUS | Status: DC | PRN
  Administered 2017-05-26: 10 mL
  Filled 2017-05-26: qty 10

## 2017-05-26 MED ORDER — SODIUM CHLORIDE 0.9 % IV SOLN
200.0000 mg | Freq: Once | INTRAVENOUS | Status: AC
  Administered 2017-05-26: 200 mg via INTRAVENOUS
  Filled 2017-05-26: qty 8

## 2017-05-26 MED ORDER — HEPARIN SOD (PORK) LOCK FLUSH 100 UNIT/ML IV SOLN
500.0000 [IU] | Freq: Once | INTRAVENOUS | Status: AC | PRN
  Administered 2017-05-26: 500 [IU]
  Filled 2017-05-26: qty 5

## 2017-05-26 NOTE — Patient Instructions (Signed)
[facility] Discharge Instructions for Patients Receiving Chemotherapy  Today you received the following chemotherapy agents: Keytruda   To help prevent nausea and vomiting after your treatment, we encourage you to take your nausea medication as directed    If you develop nausea and vomiting that is not controlled by your nausea medication, call the clinic.   BELOW ARE SYMPTOMS THAT SHOULD BE REPORTED IMMEDIATELY:  *FEVER GREATER THAN 100.5 F  *CHILLS WITH OR WITHOUT FEVER  NAUSEA AND VOMITING THAT IS NOT CONTROLLED WITH YOUR NAUSEA MEDICATION  *UNUSUAL SHORTNESS OF BREATH  *UNUSUAL BRUISING OR BLEEDING  TENDERNESS IN MOUTH AND THROAT WITH OR WITHOUT PRESENCE OF ULCERS  *URINARY PROBLEMS  *BOWEL PROBLEMS  UNUSUAL RASH Items with * indicate a potential emergency and should be followed up as soon as possible.  Feel free to call the clinic you have any questions or concerns. The clinic phone number is [phone].  Please show the CHEMO ALERT CARD at check-in to the Emergency Department and triage nurse.   

## 2017-05-26 NOTE — Progress Notes (Signed)
Reviewed pt labs with [name] and pt is ok to treat with creatinine of 1.8
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
## 2017-05-27 ENCOUNTER — Telehealth: Payer: Self-pay | Admitting: Internal Medicine

## 2017-05-27 ENCOUNTER — Inpatient Hospital Stay (HOSPITAL_COMMUNITY)
Admission: EM | Admit: 2017-05-27 | Discharge: 2017-05-30 | DRG: 193 | Disposition: A | Discharge status: Home / Self Care | Payer: PPO | Attending: Internal Medicine | Admitting: Internal Medicine

## 2017-05-27 ENCOUNTER — Emergency Department (HOSPITAL_COMMUNITY): Payer: PPO

## 2017-05-27 ENCOUNTER — Other Ambulatory Visit: Payer: Self-pay

## 2017-05-27 ENCOUNTER — Encounter (HOSPITAL_COMMUNITY): Payer: Self-pay

## 2017-05-27 DIAGNOSIS — N281 Cyst of kidney, acquired: Secondary | ICD-10-CM | POA: Diagnosis not present

## 2017-05-27 DIAGNOSIS — L03116 Cellulitis of left lower limb: Secondary | ICD-10-CM

## 2017-05-27 DIAGNOSIS — Z85038 Personal history of other malignant neoplasm of large intestine: Secondary | ICD-10-CM

## 2017-05-27 DIAGNOSIS — T451X5A Adverse effect of antineoplastic and immunosuppressive drugs, initial encounter: Secondary | ICD-10-CM | POA: Diagnosis not present

## 2017-05-27 DIAGNOSIS — Z85118 Personal history of other malignant neoplasm of bronchus and lung: Secondary | ICD-10-CM

## 2017-05-27 DIAGNOSIS — Z87442 Personal history of urinary calculi: Secondary | ICD-10-CM

## 2017-05-27 DIAGNOSIS — I10 Essential (primary) hypertension: Secondary | ICD-10-CM | POA: Diagnosis present

## 2017-05-27 DIAGNOSIS — Z8673 Personal history of transient ischemic attack (TIA), and cerebral infarction without residual deficits: Secondary | ICD-10-CM

## 2017-05-27 DIAGNOSIS — I4891 Unspecified atrial fibrillation: Secondary | ICD-10-CM | POA: Diagnosis present

## 2017-05-27 DIAGNOSIS — J440 Chronic obstructive pulmonary disease with acute lower respiratory infection: Secondary | ICD-10-CM | POA: Diagnosis present

## 2017-05-27 DIAGNOSIS — D6481 Anemia due to antineoplastic chemotherapy: Secondary | ICD-10-CM | POA: Diagnosis not present

## 2017-05-27 DIAGNOSIS — K219 Gastro-esophageal reflux disease without esophagitis: Secondary | ICD-10-CM | POA: Diagnosis not present

## 2017-05-27 DIAGNOSIS — J9621 Acute and chronic respiratory failure with hypoxia: Secondary | ICD-10-CM | POA: Diagnosis present

## 2017-05-27 DIAGNOSIS — Y95 Nosocomial condition: Secondary | ICD-10-CM | POA: Diagnosis not present

## 2017-05-27 DIAGNOSIS — C787 Secondary malignant neoplasm of liver and intrahepatic bile duct: Secondary | ICD-10-CM | POA: Diagnosis present

## 2017-05-27 DIAGNOSIS — I129 Hypertensive chronic kidney disease with stage 1 through stage 4 chronic kidney disease, or unspecified chronic kidney disease: Secondary | ICD-10-CM | POA: Diagnosis present

## 2017-05-27 DIAGNOSIS — J449 Chronic obstructive pulmonary disease, unspecified: Secondary | ICD-10-CM | POA: Diagnosis not present

## 2017-05-27 DIAGNOSIS — I482 Chronic atrial fibrillation: Secondary | ICD-10-CM | POA: Diagnosis present

## 2017-05-27 DIAGNOSIS — Z808 Family history of malignant neoplasm of other organs or systems: Secondary | ICD-10-CM

## 2017-05-27 DIAGNOSIS — C229 Malignant neoplasm of liver, not specified as primary or secondary: Secondary | ICD-10-CM | POA: Diagnosis not present

## 2017-05-27 DIAGNOSIS — Z9981 Dependence on supplemental oxygen: Secondary | ICD-10-CM

## 2017-05-27 DIAGNOSIS — C50212 Malignant neoplasm of upper-inner quadrant of left female breast: Secondary | ICD-10-CM | POA: Diagnosis present

## 2017-05-27 DIAGNOSIS — Z7901 Long term (current) use of anticoagulants: Secondary | ICD-10-CM

## 2017-05-27 DIAGNOSIS — C799 Secondary malignant neoplasm of unspecified site: Secondary | ICD-10-CM

## 2017-05-27 DIAGNOSIS — J189 Pneumonia, unspecified organism: Principal | ICD-10-CM | POA: Diagnosis present

## 2017-05-27 DIAGNOSIS — Z8249 Family history of ischemic heart disease and other diseases of the circulatory system: Secondary | ICD-10-CM

## 2017-05-27 DIAGNOSIS — Z170 Estrogen receptor positive status [ER+]: Secondary | ICD-10-CM | POA: Diagnosis present

## 2017-05-27 DIAGNOSIS — C3431 Malignant neoplasm of lower lobe, right bronchus or lung: Secondary | ICD-10-CM | POA: Diagnosis present

## 2017-05-27 DIAGNOSIS — N183 Chronic kidney disease, stage 3 (moderate): Secondary | ICD-10-CM | POA: Diagnosis present

## 2017-05-27 DIAGNOSIS — G629 Polyneuropathy, unspecified: Secondary | ICD-10-CM | POA: Diagnosis not present

## 2017-05-27 DIAGNOSIS — R079 Chest pain, unspecified: Secondary | ICD-10-CM | POA: Diagnosis not present

## 2017-05-27 DIAGNOSIS — E785 Hyperlipidemia, unspecified: Secondary | ICD-10-CM | POA: Diagnosis not present

## 2017-05-27 DIAGNOSIS — C3490 Malignant neoplasm of unspecified part of unspecified bronchus or lung: Secondary | ICD-10-CM | POA: Diagnosis not present

## 2017-05-27 DIAGNOSIS — C50411 Malignant neoplasm of upper-outer quadrant of right female breast: Secondary | ICD-10-CM | POA: Diagnosis not present

## 2017-05-27 DIAGNOSIS — Z853 Personal history of malignant neoplasm of breast: Secondary | ICD-10-CM

## 2017-05-27 DIAGNOSIS — Z833 Family history of diabetes mellitus: Secondary | ICD-10-CM

## 2017-05-27 DIAGNOSIS — Z87891 Personal history of nicotine dependence: Secondary | ICD-10-CM

## 2017-05-27 LAB — COMPREHENSIVE METABOLIC PANEL
ALBUMIN: 3.2 g/dL — AB (ref 3.5–5.0)
ALT: 12 U/L — AB (ref 14–54)
AST: 16 U/L (ref 15–41)
Alkaline Phosphatase: 52 U/L (ref 38–126)
Anion gap: 9 (ref 5–15)
BUN: 31 mg/dL — AB (ref 6–20)
CHLORIDE: 105 mmol/L (ref 101–111)
CO2: 27 mmol/L (ref 22–32)
Calcium: 8.8 mg/dL — ABNORMAL LOW (ref 8.9–10.3)
Creatinine, Ser: 1.76 mg/dL — ABNORMAL HIGH (ref 0.44–1.00)
GFR calc Af Amer: 31 mL/min — ABNORMAL LOW (ref >60)
GFR, EST NON AFRICAN AMERICAN: 27 mL/min — AB (ref >60)
Glucose, Bld: 109 mg/dL — ABNORMAL HIGH (ref 65–99)
POTASSIUM: 4.1 mmol/L (ref 3.5–5.1)
SODIUM: 141 mmol/L (ref 135–145)
Total Bilirubin: 0.8 mg/dL (ref 0.3–1.2)
Total Protein: 6.5 g/dL (ref 6.5–8.1)

## 2017-05-27 LAB — CBC WITH DIFFERENTIAL/PLATELET
Basophils Absolute: 0 10*3/uL (ref 0.0–0.1)
Basophils Relative: 0 %
EOS PCT: 1 %
Eosinophils Absolute: 0.1 10*3/uL (ref 0.0–0.7)
HCT: 37.1 % (ref 36.0–46.0)
HEMOGLOBIN: 11.8 g/dL — AB (ref 12.0–15.0)
LYMPHS ABS: 0.9 10*3/uL (ref 0.7–4.0)
Lymphocytes Relative: 10 %
MCH: 26.8 pg (ref 26.0–34.0)
MCHC: 31.8 g/dL (ref 30.0–36.0)
MCV: 84.3 fL (ref 78.0–100.0)
Monocytes Absolute: 1.1 10*3/uL — ABNORMAL HIGH (ref 0.1–1.0)
Monocytes Relative: 11 %
Neutro Abs: 7.4 10*3/uL (ref 1.7–7.7)
Neutrophils Relative %: 78 %
PLATELETS: 173 10*3/uL (ref 150–400)
RBC: 4.4 MIL/uL (ref 3.87–5.11)
RDW: 15 % (ref 11.5–15.5)
WBC: 9.5 10*3/uL (ref 4.0–10.5)

## 2017-05-27 LAB — URINALYSIS, ROUTINE W REFLEX MICROSCOPIC
BILIRUBIN URINE: NEGATIVE
Glucose, UA: NEGATIVE mg/dL
Hgb urine dipstick: NEGATIVE
KETONES UR: NEGATIVE mg/dL
Nitrite: NEGATIVE
Protein, ur: NEGATIVE mg/dL
Specific Gravity, Urine: 1.015 (ref 1.005–1.030)
pH: 5 (ref 5.0–8.0)

## 2017-05-27 LAB — I-STAT CG4 LACTIC ACID, ED: LACTIC ACID, VENOUS: 0.66 mmol/L (ref 0.5–1.9)

## 2017-05-27 LAB — PROTIME-INR
INR: 1.56
PROTHROMBIN TIME: 18.8 s — AB (ref 11.4–15.2)

## 2017-05-27 LAB — TROPONIN I: Troponin I: <0.03 ng/mL (ref <0.03)

## 2017-05-27 MED ORDER — GUAIFENESIN ER 600 MG PO TB12
600.0000 mg | ORAL_TABLET | Freq: Two times a day (BID) | ORAL | Status: DC
  Administered 2017-05-27 – 2017-05-30 (×6): 600 mg via ORAL
  Filled 2017-05-27 (×6): qty 1

## 2017-05-27 MED ORDER — HYDROCODONE-ACETAMINOPHEN 5-325 MG PO TABS: 1.0000 | ORAL_TABLET | ORAL | Status: DC | PRN

## 2017-05-27 MED ORDER — WARFARIN SODIUM 5 MG PO TABS
5.0000 mg | ORAL_TABLET | Freq: Once | ORAL | Status: AC
  Administered 2017-05-27: 5 mg via ORAL
  Filled 2017-05-27: qty 1

## 2017-05-27 MED ORDER — DEXTROSE 5 % IV SOLN: 1.0000 g | Freq: Three times a day (TID) | INTRAVENOUS | Status: DC

## 2017-05-27 MED ORDER — SODIUM CHLORIDE 0.9 % IV SOLN
INTRAVENOUS | Status: AC
  Administered 2017-05-27: 22:00:00 via INTRAVENOUS

## 2017-05-27 MED ORDER — DEXTROSE 5 % IV SOLN
500.0000 mg | Freq: Once | INTRAVENOUS | Status: AC
  Administered 2017-05-27: 500 mg via INTRAVENOUS
  Filled 2017-05-27: qty 500

## 2017-05-27 MED ORDER — WARFARIN - PHARMACIST DOSING INPATIENT: Freq: Every day | Status: DC

## 2017-05-27 MED ORDER — IPRATROPIUM BROMIDE 0.02 % IN SOLN
0.5000 mg | Freq: Four times a day (QID) | RESPIRATORY_TRACT | Status: DC
  Administered 2017-05-27: 0.5 mg via RESPIRATORY_TRACT
  Filled 2017-05-27: qty 2.5

## 2017-05-27 MED ORDER — DEXTROSE 5 % IV SOLN
2.0000 g | INTRAVENOUS | Status: DC
  Administered 2017-05-27 – 2017-05-29 (×3): 2 g via INTRAVENOUS
  Filled 2017-05-27 (×3): qty 2

## 2017-05-27 MED ORDER — VANCOMYCIN HCL 10 G IV SOLR
1500.0000 mg | INTRAVENOUS | Status: DC
  Administered 2017-05-28 – 2017-05-29 (×2): 1500 mg via INTRAVENOUS
  Filled 2017-05-27 (×2): qty 1500

## 2017-05-27 MED ORDER — SIMVASTATIN 10 MG PO TABS
10.0000 mg | ORAL_TABLET | Freq: Every day | ORAL | Status: DC
  Administered 2017-05-27 – 2017-05-29 (×3): 10 mg via ORAL
  Filled 2017-05-27 (×3): qty 1

## 2017-05-27 MED ORDER — DEXTROSE 5 % IV SOLN
1.0000 g | Freq: Once | INTRAVENOUS | Status: AC
  Administered 2017-05-27: 1 g via INTRAVENOUS
  Filled 2017-05-27: qty 10

## 2017-05-27 MED ORDER — METOPROLOL TARTRATE 50 MG PO TABS
150.0000 mg | ORAL_TABLET | Freq: Two times a day (BID) | ORAL | Status: DC
  Administered 2017-05-27 – 2017-05-30 (×6): 150 mg via ORAL
  Filled 2017-05-27 (×7): qty 3

## 2017-05-27 MED ORDER — ALBUTEROL SULFATE (2.5 MG/3ML) 0.083% IN NEBU
2.5000 mg | INHALATION_SOLUTION | RESPIRATORY_TRACT | Status: DC | PRN
  Administered 2017-05-30: 2.5 mg via RESPIRATORY_TRACT
  Filled 2017-05-27: qty 3

## 2017-05-27 MED ORDER — VANCOMYCIN HCL 10 G IV SOLR
2000.0000 mg | Freq: Once | INTRAVENOUS | Status: AC
  Administered 2017-05-27: 2000 mg via INTRAVENOUS
  Filled 2017-05-27: qty 2000

## 2017-05-27 MED ORDER — ACETAMINOPHEN 325 MG PO TABS
650.0000 mg | ORAL_TABLET | Freq: Four times a day (QID) | ORAL | Status: DC | PRN
  Administered 2017-05-28 – 2017-05-29 (×4): 650 mg via ORAL
  Filled 2017-05-27 (×4): qty 2

## 2017-05-27 MED ORDER — ONDANSETRON HCL 4 MG PO TABS: 4.0000 mg | ORAL_TABLET | Freq: Four times a day (QID) | ORAL | Status: DC | PRN

## 2017-05-27 MED ORDER — IPRATROPIUM-ALBUTEROL 0.5-2.5 (3) MG/3ML IN SOLN
3.0000 mL | Freq: Three times a day (TID) | RESPIRATORY_TRACT | Status: DC
  Administered 2017-05-28 – 2017-05-30 (×7): 3 mL via RESPIRATORY_TRACT
  Filled 2017-05-27 (×8): qty 3

## 2017-05-27 MED ORDER — ONDANSETRON HCL 4 MG/2ML IJ SOLN: 4.0000 mg | Freq: Four times a day (QID) | INTRAMUSCULAR | Status: DC | PRN

## 2017-05-27 MED ORDER — ACETAMINOPHEN 650 MG RE SUPP: 650.0000 mg | Freq: Four times a day (QID) | RECTAL | Status: DC | PRN

## 2017-05-27 NOTE — Progress Notes (Signed)
Pharmacy Antibiotic Note  Tammie [name] is a 76 y.o. female admitted on 05/27/2017 with pneumonia.  Pharmacy has been consulted for vancomcyin dosing.   PMH includes  non-small cell lung cancer consistent with adenocarcinoma,  Breast cancer invasive ductal carcinoma, metastasis to the liver from unknown primary source,  A.fib, COPD, GERD, HTN, TIA, CVA, CKD stage 3.   Pt's last chemo infusion was 05/26/2017. Pt is receiving pemrolizumab  Plan:  Vancomycin 2000 mg IV x1, then 1500 mg IV q24h, Target trough 15-20 mcg/ml  Will adjust cefepime dose to 2 gr IV q24h to reflect current CrCl  Normalized CrCl is 31 ml/min  Monitor clinical course, renal function, cultures as available   Height: 5\' 11"  (180.3 cm) Weight: 269 lb (122 kg) IBW/kg (Calculated) : 70.8  Temp (24hrs), Avg:99.1 F (37.3 C), Min:99.1 F (37.3 C), Max:99.1 F (37.3 C)   Recent Labs Lab 05/26/17 1118 05/26/17 1118 05/27/17 1707 05/27/17 1719  WBC 8.5  --  9.5  --   CREATININE  --  1.8* 1.76*  --   LATICACIDVEN  --   --   --  0.66    Estimated Creatinine Clearance: 39.8 mL/min (A) (by C-G formula based on SCr of 1.76 mg/dL (H)).    Allergies  Allergen Reactions  . Tape Hives  . Contrast Media [Iodinated Diagnostic Agents] Hives  . Iohexol Other (See Comments)     Code: HIVES, Desc: hives w/ contrast '08// better w/ benadryl   . Sulfa Antibiotics Other (See Comments)    REACTION: unknown  . Sulfamethoxazole-Trimethoprim Other (See Comments)    REACTION: unknown    Antimicrobials this admission: Azithromycin x 1 in ED Ceftriaxone x1 in ED   Vancomycin 6/1 >>  Cefepime 6/1  >>   Dose adjustments this admission: ---  Microbiology results: 6/1 BCx: sent 6/1 UCx:  sent 6/1 Strep pneumo antigen:  6/1 HIV antibody:   Thank you for allowing pharmacy to be a part of this patient's care.   Royetta Asal, PharmD, BCPS Pager [phone] 05/27/2017 8:35 PM
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
## 2017-05-27 NOTE — ED Notes (Signed)
Bed: BO99 Expected date:  Expected time:  Means of arrival:  Comments: Triage 1

## 2017-05-27 NOTE — ED Triage Notes (Signed)
Pt states that she had her Keytruda yesterday and last night, she began experiencing chills and fever. She called her cancer Dr and the RN advised her to go to the ER. Also reporting increase SOB and some chest tightness. A&Ox4. Ambulatory. Pt has metastatic cancer.

## 2017-05-27 NOTE — Telephone Encounter (Signed)
Scheduled additional appts per 5/31 LOS - patient aware
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
## 2017-05-27 NOTE — Telephone Encounter (Signed)
Pt called . She received Keytruda yesterday via port a cath . She started having shaking chills last night and woke up with bed linens wet and she reports she is having chills right now. She took her temp and it is 99. She is sob on the phone and has a cough.  I instructed pt to go to ED now for evaluation. She said she will go.

## 2017-05-28 DIAGNOSIS — C3431 Malignant neoplasm of lower lobe, right bronchus or lung: Secondary | ICD-10-CM

## 2017-05-28 DIAGNOSIS — J189 Pneumonia, unspecified organism: Principal | ICD-10-CM

## 2017-05-28 DIAGNOSIS — J449 Chronic obstructive pulmonary disease, unspecified: Secondary | ICD-10-CM

## 2017-05-28 DIAGNOSIS — I482 Chronic atrial fibrillation: Secondary | ICD-10-CM

## 2017-05-28 DIAGNOSIS — G629 Polyneuropathy, unspecified: Secondary | ICD-10-CM

## 2017-05-28 DIAGNOSIS — I10 Essential (primary) hypertension: Secondary | ICD-10-CM

## 2017-05-28 LAB — COMPREHENSIVE METABOLIC PANEL
ALT: 13 U/L — ABNORMAL LOW (ref 14–54)
ANION GAP: 9 (ref 5–15)
AST: 17 U/L (ref 15–41)
Albumin: 2.8 g/dL — ABNORMAL LOW (ref 3.5–5.0)
Alkaline Phosphatase: 56 U/L (ref 38–126)
BILIRUBIN TOTAL: 0.4 mg/dL (ref 0.3–1.2)
BUN: 28 mg/dL — ABNORMAL HIGH (ref 6–20)
CHLORIDE: 108 mmol/L (ref 101–111)
CO2: 27 mmol/L (ref 22–32)
Calcium: 8.2 mg/dL — ABNORMAL LOW (ref 8.9–10.3)
Creatinine, Ser: 1.71 mg/dL — ABNORMAL HIGH (ref 0.44–1.00)
GFR, EST AFRICAN AMERICAN: 33 mL/min — AB (ref >60)
GFR, EST NON AFRICAN AMERICAN: 28 mL/min — AB (ref >60)
Glucose, Bld: 130 mg/dL — ABNORMAL HIGH (ref 65–99)
POTASSIUM: 3.9 mmol/L (ref 3.5–5.1)
Sodium: 144 mmol/L (ref 135–145)
TOTAL PROTEIN: 6 g/dL — AB (ref 6.5–8.1)

## 2017-05-28 LAB — PROTIME-INR
INR: 2.21
Prothrombin Time: 24.9 seconds — ABNORMAL HIGH (ref 11.4–15.2)

## 2017-05-28 LAB — HIV ANTIBODY (ROUTINE TESTING W REFLEX): HIV Screen 4th Generation wRfx: NONREACTIVE

## 2017-05-28 LAB — CBC
HEMATOCRIT: 35.8 % — AB (ref 36.0–46.0)
HEMOGLOBIN: 11.1 g/dL — AB (ref 12.0–15.0)
MCH: 26.4 pg (ref 26.0–34.0)
MCHC: 31 g/dL (ref 30.0–36.0)
MCV: 85 fL (ref 78.0–100.0)
Platelets: 163 10*3/uL (ref 150–400)
RBC: 4.21 MIL/uL (ref 3.87–5.11)
RDW: 15.2 % (ref 11.5–15.5)
WBC: 8.7 10*3/uL (ref 4.0–10.5)

## 2017-05-28 LAB — EXPECTORATED SPUTUM ASSESSMENT W GRAM STAIN, RFLX TO RESP C

## 2017-05-28 LAB — EXPECTORATED SPUTUM ASSESSMENT W REFEX TO RESP CULTURE

## 2017-05-28 LAB — STREP PNEUMONIAE URINARY ANTIGEN: Strep Pneumo Urinary Antigen: NEGATIVE

## 2017-05-28 LAB — MAGNESIUM: MAGNESIUM: 1.7 mg/dL (ref 1.7–2.4)

## 2017-05-28 LAB — TSH: TSH: 3.388 u[IU]/mL (ref 0.350–4.500)

## 2017-05-28 LAB — PHOSPHORUS: PHOSPHORUS: 2.7 mg/dL (ref 2.5–4.6)

## 2017-05-28 MED ORDER — WARFARIN SODIUM 2 MG PO TABS
2.0000 mg | ORAL_TABLET | Freq: Once | ORAL | Status: AC
  Administered 2017-05-28: 2 mg via ORAL
  Filled 2017-05-28: qty 1

## 2017-05-28 MED ORDER — HYDROCORTISONE 1 % EX CREA
1.0000 "application " | TOPICAL_CREAM | Freq: Three times a day (TID) | CUTANEOUS | Status: DC | PRN
  Filled 2017-05-28: qty 28
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
## 2017-05-28 NOTE — Progress Notes (Signed)
PROGRESS NOTE  Tammie [name] JSE:[id number] DOB: [birth year] DOA: 05/27/2017 PCP: [name], MD   LOS: 1 day   Brief Narrative / Interim history: Tammie [name] is a 76 y.o. 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  Assessment & Plan: Active Problems:   COPD with chronic bronchitis (HCC)   HTN (hypertension)   Breast cancer of upper-outer quadrant of right female breast ([place])   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 (HCC)   Antineoplastic chemotherapy induced anemia   Neuropathy   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 ([place])   A-fib ([place])   HCAP (healthcare-associated pneumonia)   Healthcare associated pneumonia -Patient started on vancomycin and cefepime, she is currently on immunotherapy  -Continue IV antibiotics, based on clinical course may switch to p.o. 24-48 hrs.  History of lung cancer/breast cancer/liver metastases of undetermined origin -Followed as an outpatient by oncology  Chronic kidney disease stage III -Creatinine appears at baseline  Mild normocytic anemia -The setting of underlying malignancy and chronic kidney disease, hemoglobin appears stable  Hypertension -Continue metoprolol, hold Lasix for now  Chronic Atrial fibrillation (HCC) -CHA2DS2 vas score 5 -continue current anticoagulation with Coumadin per pharmacy,   -Continue metoprolol for rate control      COPD with chronic bronchitis ([place])  -Continue duo nebs  Hyperlipidemia -Continue Zocor   DVT prophylaxis: Coumadin Code Status: Full  code Family Communication: no family bedside Disposition Plan: home when ready   Consultants:   None   Procedures:   None   Antimicrobials:  Vancomycin 6/2 >>  Cefepime 6/2 >>  Subjective: - no chest pain, no abdominal pain, nausea or vomiting.  Complains of wheezing and mild shortness of breath  Objective: Vitals:   05/27/17 2040 05/27/17 2124 05/28/17 0608 05/28/17 0824  BP: (!) 144/87  137/87   Pulse: 72  64   Resp: 20  18   Temp: 99 F (37.2 C)  98.7 F (37.1 C)   TempSrc: Oral  Oral   SpO2: 97% 93% 97% 90%  Weight: 122.4 kg (269 lb 13.5 oz)     Height: 5\' 11"  (1.803 m)       Intake/Output Summary (Last 24 hours) at 05/28/17 1301 Last data filed at 05/28/17 1015  Gross per 24 hour  Intake          1613.33 ml  Output                0 ml  Net          1613.33 ml   Filed Weights   05/27/17 1548 05/27/17 2040  Weight: 122 kg (269 lb) 122.4 kg (269 lb 13.5 oz)    Examination:  Vitals:   05/27/17 2040 05/27/17 2124 05/28/17 0608 05/28/17 0824  BP: (!) 144/87  137/87   Pulse: 72  64   Resp: 20  18   Temp: 99 F (37.2 C)  98.7 F (37.1 C)   TempSrc: Oral  Oral   SpO2: 97% 93% 97% 90%  Weight: 122.4 kg (269 lb 13.5 oz)  Height: 5\' 11"  (1.803 m)      Constitutional: NAD Eyes: lids and conjunctivae normal ENMT: Mucous membranes are moist.  Respiratory: minimal wheezing, no crackles. Normal respiratory effort.  Cardiovascular: Regular rate and rhythm, no murmurs / rubs / gallops.  Abdomen: no tenderness. Bowel sounds positive.  Neurologic: non focal   Data Reviewed: I have personally reviewed following labs and imaging studies  CBC:  Recent Labs Lab 05/26/17 1118 05/27/17 1707 05/28/17 0600  WBC 8.5 9.5 8.7  NEUTROABS 6.8* 7.4  --   HGB 12.8 11.8* 11.1*  HCT 40.3 37.1 35.8*  MCV 84.8 84.3 85.0  PLT 162 173 417   Basic Metabolic Panel:  Recent Labs Lab 05/26/17 1118 05/27/17 1707 05/28/17 0600  NA 143 141 144  K 4.1 4.1 3.9  CL   --  105 108  CO2 25 27 27   GLUCOSE 110 109* 130*  BUN 34.8* 31* 28*  CREATININE 1.8* 1.76* 1.71*  CALCIUM 9.2 8.8* 8.2*  MG  --   --  1.7  PHOS  --   --  2.7   GFR: Estimated Creatinine Clearance: 41 mL/min (A) (by C-G formula based on SCr of 1.71 mg/dL (H)). Liver Function Tests:  Recent Labs Lab 05/26/17 1118 05/27/17 1707 05/28/17 0600  AST 12 16 17   ALT 9 12* 13*  ALKPHOS 65 52 56  BILITOT 0.81 0.8 0.4  PROT 7.0 6.5 6.0*  ALBUMIN 3.4* 3.2* 2.8*   No results for input(s): LIPASE, AMYLASE in the last 168 hours. No results for input(s): AMMONIA in the last 168 hours. Coagulation Profile:  Recent Labs Lab 05/27/17 1707 05/28/17 0600  INR 1.56 2.21   Cardiac Enzymes:  Recent Labs Lab 05/27/17 1707  TROPONINI <0.03   BNP (last 3 results) No results for input(s): PROBNP in the last 8760 hours. HbA1C: No results for input(s): HGBA1C in the last 72 hours. CBG: No results for input(s): GLUCAP in the last 168 hours. Lipid Profile: No results for input(s): CHOL, HDL, LDLCALC, TRIG, CHOLHDL, LDLDIRECT in the last 72 hours. Thyroid Function Tests:  Recent Labs  05/28/17 0600  TSH 3.388   Anemia Panel: No results for input(s): VITAMINB12, FOLATE, FERRITIN, TIBC, IRON, RETICCTPCT in the last 72 hours. Urine analysis:    Component Value Date/Time   COLORURINE YELLOW 05/27/2017 1601   APPEARANCEUR CLEAR 05/27/2017 1601   LABSPEC 1.015 05/27/2017 1601   LABSPEC 1.020 03/03/2017 1501   PHURINE 5.0 05/27/2017 1601   GLUCOSEU NEGATIVE 05/27/2017 1601   GLUCOSEU Negative 03/03/2017 1501   HGBUR NEGATIVE 05/27/2017 1601   BILIRUBINUR NEGATIVE 05/27/2017 1601   BILIRUBINUR Negative 03/03/2017 1501   KETONESUR NEGATIVE 05/27/2017 1601   PROTEINUR NEGATIVE 05/27/2017 1601   UROBILINOGEN 0.2 03/03/2017 1501   NITRITE NEGATIVE 05/27/2017 1601   LEUKOCYTESUR SMALL (A) 05/27/2017 1601   LEUKOCYTESUR Small 03/03/2017 1501   Sepsis Labs: Invalid input(s):  PROCALCITONIN, LACTICIDVEN  Recent Results (from the past 240 hour(s))  Culture, sputum-assessment     Status: None   Collection Time: 05/28/17  9:50 AM  Result Value Ref Range Status   Specimen Description SPUTUM  Final   Special Requests Immunocompromised  Final   Sputum evaluation THIS SPECIMEN IS ACCEPTABLE FOR SPUTUM CULTURE  Final   Report Status 05/28/2017 FINAL  Final      Radiology Studies: Dg Chest 2 View  Result Date: 05/27/2017 CLINICAL DATA:  Chest pain since last night. History of a left upper lobectomy and lung carcinoma. EXAM: CHEST  2 VIEW COMPARISON:  CT chest 05/05/2017.  PA and lateral chest 08/10/2016. FINDINGS: Port-A-Cath is again seen. No consolidative process, pneumothorax or effusion. Postoperative change of left upper lobectomy is seen. There is cardiomegaly. Atherosclerosis noted. No acute bony abnormality. IMPRESSION: No acute disease. Electronically Signed   By: Inge Rise M.D.   On: 05/27/2017 16:29   Ct Chest Wo Contrast  Result Date: 05/27/2017 CLINICAL DATA:  Fever and chills beginning last evening. Patient had chemotherapy yesterday with increasing dyspnea and chest tightness. History of colon, lung and breast cancer with metastasis. EXAM: CT CHEST WITHOUT CONTRAST TECHNIQUE: Multidetector CT imaging of the chest was performed following the standard protocol without IV contrast. COMPARISON:  05/05/2017 chest CT FINDINGS: Cardiovascular: Stable mild cardiomegaly without pericardial effusion. Aortic atherosclerosis and coronary arteriosclerosis is again noted. Mediastinum/Nodes: No enlarged mediastinal or axillary lymph nodes. Thyroid gland, trachea, and esophagus demonstrate no significant findings. Lungs/Pleura: New confluent airspace opacity with air bronchograms in the right upper lobe since recent comparison exam consistent with pneumonia. Streaky atelectasis and/or scarring is seen bilaterally within both lungs and right lung base status post wedge  resections. Stable tiny subpleural nodular densities are seen in the right upper lobe measuring up to 6 mm, series 3, image 21, series 3, image 29. A 7 x 5 mm posterior right lower lobe pulmonary nodule is stable, series 3, image 93. No effusion or pneumothorax. Upper Abdomen: Partially visualized gallbladder with 19 mm gallstone. Left-sided partially imaged renal cysts that appear simple. No adrenal mass. No lymphadenopathy on images provided. Subtle hypodensity in the left hepatic lobe series 2, image 147 measuring approximately 18 mm is unchanged given lack of IV contrast for better assessment. Stable 13 mm short axis porta hepatis lymph node. Musculoskeletal: Mild thoracic spondylosis. No acute nor suspicious osseous lesions. IMPRESSION: 1. New right upper lobe pneumonic consolidation with air bronchograms. 2. Stable postoperative scarring of the lungs with stable nodularity. 3. Stable 18 mm subtle hypodensity in the left hepatic lobe and 13 mm short axis porta hepatis lymph node. 4. Uncomplicated cholelithiasis.  Left-sided renal cysts. Electronically Signed   By: Ashley Royalty M.D.   On: 05/27/2017 17:56     Scheduled Meds: . guaiFENesin  600 mg Oral BID  . ipratropium-albuterol  3 mL Nebulization TID  . metoprolol tartrate  150 mg Oral BID  . simvastatin  10 mg Oral QHS  . warfarin  2 mg Oral ONCE-1800  . Warfarin - Pharmacist Dosing Inpatient   Does not apply q1800   Continuous Infusions: . ceFEPime (MAXIPIME) IV Stopped (05/27/17 2249)  . vancomycin      Marzetta Board, MD, PhD [facility] Pager [phone]  If 7PM-7AM, please contact night-coverage [email or web address] Password [email or web address] 05/28/2017, 1:01 PM

## 2017-05-28 NOTE — Plan of Care (Signed)
Problem: Safety: Goal: Ability to remain free from injury will improve Outcome: Progressing Patient will continue to use call bell for assistance when needing help.

## 2017-05-29 DIAGNOSIS — D6481 Anemia due to antineoplastic chemotherapy: Secondary | ICD-10-CM

## 2017-05-29 DIAGNOSIS — T451X5A Adverse effect of antineoplastic and immunosuppressive drugs, initial encounter: Secondary | ICD-10-CM

## 2017-05-29 LAB — CBC
HEMATOCRIT: 41.3 % (ref 36.0–46.0)
Hemoglobin: 13.2 g/dL (ref 12.0–15.0)
MCH: 27.2 pg (ref 26.0–34.0)
MCHC: 32 g/dL (ref 30.0–36.0)
MCV: 85 fL (ref 78.0–100.0)
PLATELETS: 153 10*3/uL (ref 150–400)
RBC: 4.86 MIL/uL (ref 3.87–5.11)
RDW: 15.3 % (ref 11.5–15.5)
WBC: 8.9 10*3/uL (ref 4.0–10.5)

## 2017-05-29 LAB — BASIC METABOLIC PANEL
Anion gap: 9 (ref 5–15)
BUN: 27 mg/dL — AB (ref 6–20)
CHLORIDE: 108 mmol/L (ref 101–111)
CO2: 24 mmol/L (ref 22–32)
CREATININE: 1.74 mg/dL — AB (ref 0.44–1.00)
Calcium: 8.9 mg/dL (ref 8.9–10.3)
GFR calc Af Amer: 32 mL/min — ABNORMAL LOW (ref >60)
GFR calc non Af Amer: 27 mL/min — ABNORMAL LOW (ref >60)
GLUCOSE: 137 mg/dL — AB (ref 65–99)
POTASSIUM: 4.4 mmol/L (ref 3.5–5.1)
SODIUM: 141 mmol/L (ref 135–145)

## 2017-05-29 LAB — PROTIME-INR
INR: 1.84
Prothrombin Time: 21.5 seconds — ABNORMAL HIGH (ref 11.4–15.2)

## 2017-05-29 LAB — URINE CULTURE

## 2017-05-29 MED ORDER — WARFARIN SODIUM 4 MG PO TABS
4.0000 mg | ORAL_TABLET | Freq: Once | ORAL | Status: AC
  Administered 2017-05-29: 4 mg via ORAL
  Filled 2017-05-29: qty 1

## 2017-05-29 MED ORDER — TRAMADOL HCL 50 MG PO TABS: 25.0000 mg | ORAL_TABLET | Freq: Four times a day (QID) | ORAL | Status: DC | PRN

## 2017-05-29 MED ORDER — BENZONATATE 100 MG PO CAPS: 100.0000 mg | ORAL_CAPSULE | Freq: Three times a day (TID) | ORAL | Status: DC | PRN

## 2017-05-29 NOTE — Progress Notes (Addendum)
ANTICOAGULATION CONSULT NOTE - Follow up  Pharmacy Consult for warfarin Indication: atrial fibrillation  Allergies  Allergen Reactions  . Tape Hives  . Contrast Media [Iodinated Diagnostic Agents] Hives  . Iohexol Other (See Comments)     Code: HIVES, Desc: hives w/ contrast '08// better w/ benadryl   . Sulfa Antibiotics Other (See Comments)    REACTION: unknown  . Sulfamethoxazole-Trimethoprim Other (See Comments)    REACTION: unknown    Patient Measurements: Height: 5\' 11"  (180.3 cm) Weight: 269 lb 13.5 oz (122.4 kg) IBW/kg (Calculated) : 70.8  Vital Signs: Temp: 98.9 F (37.2 C) (06/03 0429) Temp Source: Oral (06/03 0429) BP: 143/56 (06/03 0429) Pulse Rate: 83 (06/03 0429)  Labs:  Recent Labs  05/26/17 1118  05/27/17 1707 05/28/17 0600 05/29/17 0829  HGB  --   < > 11.8* 11.1* 13.2  HCT  --   --  37.1 35.8* 41.3  PLT  --   --  173 163 153  LABPROT  --   --  18.8* 24.9* 21.5*  INR  --   --  1.56 2.21 1.84  CREATININE 1.8*  --  1.76* 1.71*  --   TROPONINI  --   --  <0.03  --   --   < > = values in this interval not displayed.  Estimated Creatinine Clearance: 41 mL/min (A) (by C-G formula based on SCr of 1.71 mg/dL (H)).  Assessment: Pharmacy is consulted to dose warfarin in 76 yo female with atrial fibrillation. Pt is admitted for PNA treatment. Baseline Labs were ok, INR subtherapeutic  Pt home regimen is warfarin 2.5 mg PO daily except 5 mg on Fridays.  6/2:   INR fell below therapeutic range overnight.    CBC stable. No bleeding reported/documented.  Regular diet - tolerating.  Potential drug interactions: broad-spectrum abx can increase sensitivity to warfarin.  Goal of Therapy:  INR 2-3 Monitor platelets by anticoagulation protocol: Yes   Plan:   Warfarin 4mg  today, give a little early at 12:00.    Daily INR   Monitor for signs and symptoms of bleeding   Romeo Rabon, PharmD, pager [phone]. 05/29/2017,9:06 AM.

## 2017-05-29 NOTE — Progress Notes (Signed)
PROGRESS NOTE  Tammie [name] QPY:[id number] DOB: May 02, 1941 DOA: 05/27/2017 PCP: [name], MD   LOS: 2 days   Brief Narrative / Interim history: Tammie [name] is a 76 y.o. 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  Assessment & Plan: Active Problems:   COPD with chronic bronchitis (HCC)   HTN (hypertension)   Breast cancer of upper-outer quadrant of right female breast ([place])   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 (HCC)   Antineoplastic chemotherapy induced anemia   Neuropathy   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 ([place])   A-fib ([place])   HCAP (healthcare-associated pneumonia)   Healthcare associated pneumonia -Patient started on vancomycin and cefepime, she is currently on immunotherapy  -Continue IV antibiotics -She appears a bit worse today, desatting on room air requiring supplemental oxygen -She has been complaining of an increased cough with occasional hemoptysis -Sputum with rare gram-negative rods and few gram-positive cocci in chains, pending  Acute on chronic hypoxic respiratory failure -Patient with chronic nightly oxygen however she is okay on room air at home, however here she has a new oxygen requirement during the daytime as well.  Continue to monitor.  History of lung cancer/breast cancer/liver metastases of undetermined origin -Followed as an outpatient by oncology  Chronic kidney disease stage III -Creatinine appears at baseline  Mild normocytic anemia -The setting of underlying malignancy and chronic  kidney disease, hemoglobin appears stable  Hypertension -on metoprolol, hold Lasix for now -Blood pressure controlled this morning 143/56, continue current regimen  Chronic Atrial fibrillation (HCC) -CHA2DS2 vas score 5 -continue current anticoagulation with Coumadin per pharmacy,   -Continue metoprolol for rate control      COPD with chronic bronchitis ([place])  -Continue duo nebs  Hyperlipidemia -Continue Zocor   DVT prophylaxis: Coumadin Code Status: Full code Family Communication: no family bedside Disposition Plan: home when ready   Consultants:   None   Procedures:   None   Antimicrobials:  Vancomycin 6/2 >>  Cefepime 6/2 >>  Subjective: -Has been having chills overnight, increased cough with sputum production  Objective: Vitals:   05/28/17 2008 05/28/17 2118 05/29/17 0429 05/29/17 0816  BP: (!) 167/63  (!) 143/56   Pulse: 90  83   Resp: 16  20   Temp: 98.3 F (36.8 C)  98.9 F (37.2 C)   TempSrc: Oral  Oral   SpO2: 96% 91% 98% (!) 88%  Weight:      Height:        Intake/Output Summary (Last 24 hours) at 05/29/17 1302 Last data filed at 05/29/17 0903  Gross per 24 hour  Intake              840 ml  Output                0 ml  Net              840 ml   Filed Weights   05/27/17 1548 05/27/17 2040  Weight: 122 kg (269 lb) 122.4 kg (269 lb 13.5 oz)  Examination:  Vitals:   05/28/17 2008 05/28/17 2118 05/29/17 0429 05/29/17 0816  BP: (!) 167/63  (!) 143/56   Pulse: 90  83   Resp: 16  20   Temp: 98.3 F (36.8 C)  98.9 F (37.2 C)   TempSrc: Oral  Oral   SpO2: 96% 91% 98% (!) 88%  Weight:      Height:       Constitutional: No apparent distress Eyes: Lids and conjunctivae normal ENMT: Moist mucous membranes Respiratory: Persistent wheezing, however minimal.  No crackles.  Normal respiratory effort. Cardiovascular: Appears regular, no murmurs rubs or gallops Abdomen: No tenderness, bowel sounds positive.  Neurologic: No focal  findings, equal strength  Data Reviewed: I have personally reviewed following labs and imaging studies  CBC:  Recent Labs Lab 05/26/17 1118 05/27/17 1707 05/28/17 0600 05/29/17 0829  WBC 8.5 9.5 8.7 8.9  NEUTROABS 6.8* 7.4  --   --   HGB 12.8 11.8* 11.1* 13.2  HCT 40.3 37.1 35.8* 41.3  MCV 84.8 84.3 85.0 85.0  PLT 162 173 163 956   Basic Metabolic Panel:  Recent Labs Lab 05/26/17 1118 05/27/17 1707 05/28/17 0600 05/29/17 0829  NA 143 141 144 141  K 4.1 4.1 3.9 4.4  CL  --  105 108 108  CO2 25 27 27 24   GLUCOSE 110 109* 130* 137*  BUN 34.8* 31* 28* 27*  CREATININE 1.8* 1.76* 1.71* 1.74*  CALCIUM 9.2 8.8* 8.2* 8.9  MG  --   --  1.7  --   PHOS  --   --  2.7  --    GFR: Estimated Creatinine Clearance: 40.3 mL/min (A) (by C-G formula based on SCr of 1.74 mg/dL (H)). Liver Function Tests:  Recent Labs Lab 05/26/17 1118 05/27/17 1707 05/28/17 0600  AST 12 16 17   ALT 9 12* 13*  ALKPHOS 65 52 56  BILITOT 0.81 0.8 0.4  PROT 7.0 6.5 6.0*  ALBUMIN 3.4* 3.2* 2.8*   No results for input(s): LIPASE, AMYLASE in the last 168 hours. No results for input(s): AMMONIA in the last 168 hours. Coagulation Profile:  Recent Labs Lab 05/27/17 1707 05/28/17 0600 05/29/17 0829  INR 1.56 2.21 1.84   Cardiac Enzymes:  Recent Labs Lab 05/27/17 1707  TROPONINI <0.03   BNP (last 3 results) No results for input(s): PROBNP in the last 8760 hours. HbA1C: No results for input(s): HGBA1C in the last 72 hours. CBG: No results for input(s): GLUCAP in the last 168 hours. Lipid Profile: No results for input(s): CHOL, HDL, LDLCALC, TRIG, CHOLHDL, LDLDIRECT in the last 72 hours. Thyroid Function Tests:  Recent Labs  05/28/17 0600  TSH 3.388   Anemia Panel: No results for input(s): VITAMINB12, FOLATE, FERRITIN, TIBC, IRON, RETICCTPCT in the last 72 hours. Urine analysis:    Component Value Date/Time   COLORURINE YELLOW 05/27/2017 1601   APPEARANCEUR CLEAR 05/27/2017  1601   LABSPEC 1.015 05/27/2017 1601   LABSPEC 1.020 03/03/2017 1501   PHURINE 5.0 05/27/2017 1601   GLUCOSEU NEGATIVE 05/27/2017 1601   GLUCOSEU Negative 03/03/2017 1501   HGBUR NEGATIVE 05/27/2017 1601   BILIRUBINUR NEGATIVE 05/27/2017 1601   BILIRUBINUR Negative 03/03/2017 1501   KETONESUR NEGATIVE 05/27/2017 1601   PROTEINUR NEGATIVE 05/27/2017 1601   UROBILINOGEN 0.2 03/03/2017 1501   NITRITE NEGATIVE 05/27/2017 1601   LEUKOCYTESUR SMALL (A) 05/27/2017 1601   LEUKOCYTESUR Small 03/03/2017 1501   Sepsis Labs: Invalid input(s): PROCALCITONIN, LACTICIDVEN  Recent Results (from the past 240 hour(s))  Urine culture     Status: Abnormal   Collection Time: 05/27/17  4:04 PM  Result Value Ref Range Status   Specimen Description URINE, CLEAN CATCH  Final   Special Requests NONE  Final   Culture MULTIPLE SPECIES PRESENT, SUGGEST RECOLLECTION (A)  Final   Report Status 05/29/2017 FINAL  Final  Culture, blood (routine x 2)     Status: None (Preliminary result)   Collection Time: 05/27/17  5:07 PM  Result Value Ref Range Status   Specimen Description PORTA CATH  Final   Special Requests   Final    BOTTLES DRAWN AEROBIC AND ANAEROBIC Blood Culture adequate volume   Culture   Final    NO GROWTH < 24 HOURS Performed at [facility], [place] [address] [place], [place] [address]    Report Status PENDING  Incomplete  Culture, blood (routine x 2)     Status: None (Preliminary result)   Collection Time: 05/27/17  6:00 PM  Result Value Ref Range Status   Specimen Description BLOOD LEFT HAND  Final   Special Requests   Final    IN PEDIATRIC BOTTLE Blood Culture results may not be optimal due to an excessive volume of blood received in culture bottles   Culture   Final    NO GROWTH < 24 HOURS Performed at [facility] [address] [place], [place] [address]    Report Status PENDING  Incomplete  Culture, sputum-assessment     Status: None   Collection Time: 05/28/17  9:50  AM  Result Value Ref Range Status   Specimen Description SPUTUM  Final   Special Requests Immunocompromised  Final   Sputum evaluation THIS SPECIMEN IS ACCEPTABLE FOR SPUTUM CULTURE  Final   Report Status 05/28/2017 FINAL  Final  Culture, respiratory (NON-Expectorated)     Status: None (Preliminary result)   Collection Time: 05/28/17  9:50 AM  Result Value Ref Range Status   Specimen Description SPUTUM  Final   Special Requests Immunocompromised Reflexed from [id number]  Final   Gram Stain   Final    RARE WBC PRESENT, PREDOMINANTLY PMN FEW GRAM POSITIVE COCCI IN CHAINS RARE GRAM NEGATIVE RODS    Culture   Final    CULTURE REINCUBATED FOR BETTER GROWTH Performed at [facility], [place] [address] [place],  [address]    Report Status PENDING  Incomplete      Radiology Studies: Dg Chest 2 View  Result Date: 05/27/2017 CLINICAL DATA:  Chest pain since last night. History of a left upper lobectomy and lung carcinoma. EXAM: CHEST  2 VIEW COMPARISON:  CT chest 05/05/2017.  PA and lateral chest 08/10/2016. FINDINGS: Port-A-Cath is again seen. No consolidative process, pneumothorax or effusion. Postoperative change of left upper lobectomy is seen. There is cardiomegaly. Atherosclerosis noted. No acute bony abnormality. IMPRESSION: No acute disease. Electronically Signed   By: Inge Rise M.D.   On: 05/27/2017 16:29   Ct Chest Wo Contrast  Result Date: 05/27/2017 CLINICAL DATA:  Fever and chills beginning last evening. Patient had chemotherapy yesterday with increasing dyspnea and chest tightness. History of colon, lung and breast cancer with metastasis. EXAM: CT CHEST WITHOUT CONTRAST TECHNIQUE: Multidetector CT imaging of the chest was performed following the standard protocol without IV contrast. COMPARISON:  05/05/2017 chest CT FINDINGS: Cardiovascular: Stable mild cardiomegaly without pericardial effusion. Aortic atherosclerosis and coronary arteriosclerosis is again noted.  Mediastinum/Nodes: No enlarged mediastinal or axillary lymph nodes. Thyroid gland, trachea, and  esophagus demonstrate no significant findings. Lungs/Pleura: New confluent airspace opacity with air bronchograms in the right upper lobe since recent comparison exam consistent with pneumonia. Streaky atelectasis and/or scarring is seen bilaterally within both lungs and right lung base status post wedge resections. Stable tiny subpleural nodular densities are seen in the right upper lobe measuring up to 6 mm, series 3, image 21, series 3, image 29. A 7 x 5 mm posterior right lower lobe pulmonary nodule is stable, series 3, image 93. No effusion or pneumothorax. Upper Abdomen: Partially visualized gallbladder with 19 mm gallstone. Left-sided partially imaged renal cysts that appear simple. No adrenal mass. No lymphadenopathy on images provided. Subtle hypodensity in the left hepatic lobe series 2, image 147 measuring approximately 18 mm is unchanged given lack of IV contrast for better assessment. Stable 13 mm short axis porta hepatis lymph node. Musculoskeletal: Mild thoracic spondylosis. No acute nor suspicious osseous lesions. IMPRESSION: 1. New right upper lobe pneumonic consolidation with air bronchograms. 2. Stable postoperative scarring of the lungs with stable nodularity. 3. Stable 18 mm subtle hypodensity in the left hepatic lobe and 13 mm short axis porta hepatis lymph node. 4. Uncomplicated cholelithiasis.  Left-sided renal cysts. Electronically Signed   By: Ashley Royalty M.D.   On: 05/27/2017 17:56     Scheduled Meds: . guaiFENesin  600 mg Oral BID  . ipratropium-albuterol  3 mL Nebulization TID  . metoprolol tartrate  150 mg Oral BID  . simvastatin  10 mg Oral QHS  . warfarin  4 mg Oral Once  . Warfarin - Pharmacist Dosing Inpatient   Does not apply q1800   Continuous Infusions: . ceFEPime (MAXIPIME) IV Stopped (05/28/17 2159)  . vancomycin Stopped (05/28/17 2328)    Marzetta Board, MD,  PhD [facility] Pager [phone]  If 7PM-7AM, please contact night-coverage [email or web address] Password [email or web address] 05/29/2017, 1:02 PM

## 2017-05-30 DIAGNOSIS — C50411 Malignant neoplasm of upper-outer quadrant of right female breast: Secondary | ICD-10-CM

## 2017-05-30 DIAGNOSIS — C229 Malignant neoplasm of liver, not specified as primary or secondary: Secondary | ICD-10-CM

## 2017-05-30 LAB — CULTURE, RESPIRATORY: CULTURE: NORMAL

## 2017-05-30 LAB — PROTIME-INR
INR: 2.22
PROTHROMBIN TIME: 25 s — AB (ref 11.4–15.2)

## 2017-05-30 LAB — CREATININE, SERUM
Creatinine, Ser: 1.55 mg/dL — ABNORMAL HIGH (ref 0.44–1.00)
GFR calc non Af Amer: 32 mL/min — ABNORMAL LOW (ref >60)
GFR, EST AFRICAN AMERICAN: 37 mL/min — AB (ref >60)

## 2017-05-30 MED ORDER — LEVOFLOXACIN 500 MG PO TABS: 500.0000 mg | ORAL_TABLET | Freq: Every day | ORAL | 0 refills | Status: AC

## 2017-05-30 MED ORDER — VITAMINS A & D EX OINT
TOPICAL_OINTMENT | CUTANEOUS | Status: AC
  Administered 2017-05-30: 05:00:00
  Filled 2017-05-30: qty 5

## 2017-05-30 NOTE — Discharge Summary (Signed)
Physician Discharge Summary  Tammie [name] ZOX:[id number] DOB: 1941/10/03 DOA: 05/27/2017  PCP: [name], MD  Admit date: 05/27/2017 Discharge date: 05/30/2017  Admitted From: home Disposition:  home  Recommendations for Outpatient Follow-up:  1. Follow up with [name] as scheduled 2. Continue Levaquin for 4 more days  Home Health: none Equipment/Devices: none  Discharge Condition: stable CODE STATUS: Full code Diet recommendation: regular  HPI: Per Dr. Roel Cluck, Tammie [name] is a 76 y.o. 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 He's been having shaking chills throughout the day she checked her temperature was 99 but she has been more short of breath and usual and has had some cough. last chemo infusion was 05/26/2017. She called her oncologist and was instructed to present to emergency department. She is endorsing severe shortness of breath of note she had had 2 days of redness of her left lower extremity she showed it to her oncologist felt there was mild at that time and did not interfere with chemotherapy. Patient denies any nausea vomiting no back pain  no lower extremity pain or unilateral swelling. Reports some chest discomfort with coughing feels like her lungs. Patient is on Coumadin but subtherapeutic.  Regarding pertinent Chronic problems: In today with multiple multiple cancers including non-small cell lung cancer breast cancer and metastatic cancer to the liver from unknown primary currently undergoing chemotherapy with  Ketruda (pembrolizumab) 200 mg IV every 3 weeks. First dose 12/30/2016 for a patient with adenocarcinoma. A.fib on coumadin with hx  of CVA and rate controlled with metoprolol  Hospital Course: Discharge Diagnoses:  Active Problems:   COPD with chronic bronchitis (HCC)   HTN (hypertension)   Breast cancer of upper-outer quadrant of right female breast ([place])   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 (HCC)   Antineoplastic chemotherapy induced anemia   Neuropathy   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 ([place])   A-fib ([place])   HCAP (healthcare-associated pneumonia)   Healthcare associated pneumonia -Patient started on vancomycin and cefepime, she was maintained on broad-spectrum IV antibiotics, improved, she was afebrile and without leukocytosis, cultures have remained negative, she was transitioned to Levaquin and was discharged home in stable condition to complete 4 additional days Acute on chronic hypoxic respiratory failure -Patient with chronic nightly oxygen however she is okay on room air during the daytime at home, she did require oxygen however that was able to be weaned off and she is stable on room air, satting in the 90s with ambulation History of lung cancer/breast cancer/liver metastases of undetermined origin -Followed as an outpatient by oncology Chronic kidney disease stage III -Creatinine appears at baseline Mild normocytic anemia -The setting of underlying malignancy and chronic kidney disease, hemoglobin appears stable Hypertension -resume home medications Chronic Atrial fibrillation (HCC) -CHA2DS2 vas score 5, continue Coumadin and metoprolol COPD with chronic bronchitis (HCC) -stable, no wheezing Hyperlipidemia -Continue Zocor   Discharge Instructions   Allergies as of 05/30/2017      Reactions   Tape Hives   Contrast Media [iodinated Diagnostic Agents] Hives   Iohexol Other (See Comments)    Code: HIVES, Desc: hives w/ contrast '08// better w/  benadryl   Sulfa Antibiotics Other (See Comments)   REACTION: unknown   Sulfamethoxazole-trimethoprim Other (See Comments)   REACTION:  unknown      Medication List    TAKE these medications   acetaminophen 650 MG CR tablet Commonly known as:  TYLENOL Take 650-1,300 mg by mouth every 8 (eight) hours as needed for pain.   albuterol 108 (90 Base) MCG/ACT inhaler Commonly known as:  PROAIR HFA Inhale 2 puffs into the lungs every 6 (six) hours as needed for wheezing or shortness of breath.   calcium carbonate 500 MG chewable tablet Commonly known as:  TUMS - dosed in mg elemental calcium Chew 1 tablet by mouth as needed for indigestion or heartburn.   FISH OIL + D3 PO Take 1 capsule by mouth daily.   FLAXSEED OIL PO Take 1 tablet by mouth at bedtime.   furosemide 40 MG tablet Commonly known as:  LASIX TAKE ONE TABLET BY MOUTH IN THE MORNING AND ONE IN THE AFTERNOON   Garlic 9678 MG Caps Take 1,000 mg by mouth 2 (two) times daily.   Iron 142 (45 Fe) MG Tbcr Take 1 tablet by mouth daily.   levofloxacin 500 MG tablet Commonly known as:  LEVAQUIN Take 1 tablet (500 mg total) by mouth daily.   lidocaine-prilocaine cream Commonly known as:  EMLA Apply 1 application topically as needed. Apply to portacath site 1 hour prior to use   metoprolol tartrate 100 MG tablet Commonly known as:  LOPRESSOR TAKE ONE & ONE-HALF TABLETS BY MOUTH TWICE DAILY   simvastatin 10 MG tablet Commonly known as:  ZOCOR TAKE ONE TABLET BY MOUTH ONCE DAILY   warfarin 5 MG tablet Commonly known as:  COUMADIN TAKE ONE TABLET BY MOUTH ONCE DAILY What changed:  See the new instructions.      Follow-up Information    [name], MD. Schedule an appointment as soon as possible for a visit in 1 week(s).   Specialty:  Pediatrics Contact information: [place] [address] [phone]          Allergies  Allergen Reactions  . Tape Hives  . Contrast Media [Iodinated Diagnostic Agents] Hives  . Iohexol Other (See Comments)     Code: HIVES, Desc: hives w/ contrast '08// better w/ benadryl   . Sulfa  Antibiotics Other (See Comments)    REACTION: unknown  . Sulfamethoxazole-Trimethoprim Other (See Comments)    REACTION: unknown    Consultations:  None   Procedures/Studies:  Ct Abdomen Pelvis Wo Contrast  Result Date: 05/05/2017 CLINICAL DATA:  Right lower lobe lung cancer, adenocarcinoma also found upon liver biopsy. Renal insufficiency. EXAM: CT CHEST, ABDOMEN AND PELVIS WITHOUT CONTRAST TECHNIQUE: Multidetector CT imaging of the chest, abdomen and pelvis was performed following the standard protocol without IV contrast. COMPARISON:  Multiple exams, including 03/01/2017 and PET-CT from 12/09/2016 FINDINGS: CT CHEST FINDINGS Cardiovascular: Coronary, aortic arch, and branch vessel atherosclerotic vascular disease. Mild cardiomegaly. Mediastinum/Nodes: No pathologic adenopathy is observed in the chest. Lungs/Pleura: Biapical pleuroparenchymal scarring. 1.0 by 1.2 cm ground-glass density in the right upper lobe on image 28/4, stable and relatively similar to exams back through 02/18/2015. Postoperative findings in both lungs with scarring adjacent to wedge resection sites. Scattered fine peripheral interstitial accentuation. 0.8 by 0.5 cm posterior basal segment right lower lobe pulmonary nodule on image 97/4. This is relatively similar to the most recent prior exam but measured only 3 mm in diameter back on 02/18/2015. In the superior  segment left lower lobe the previously seen nodule is less distinct, with an approximately 7 mm highly indistinct sub solid nodule shown on image 57/4. Musculoskeletal: Mild thoracic spondylosis. CT ABDOMEN PELVIS FINDINGS Hepatobiliary: 2.4 cm gallstone in the gallbladder. Hypodense lesion corresponding to the previous segment IVb hypermetabolic lesion, measuring 1.2 cm in width on image 54/2, previously measured at 1.3 cm. Previously hypermetabolic porta hepatis lymph node measures 1.2 cm in short axis, previously the same. No obvious new liver lesions. Pancreas:  Unremarkable Spleen: Unremarkable Adrenals/Urinary Tract: Similar appearance of multiple left renal cysts. Several small bilateral nonobstructive renal calculi, the largest 4 mm in diameter in the right kidney lower pole. No hydronephrosis or hydroureter. Stomach/Bowel: Unremarkable Vascular/Lymphatic: Stable mildly enlarged porta hepatis lymph node as described above. Aortoiliac atherosclerotic vascular disease. No new adenopathy. Reproductive: Uterus absent.  Adnexa unremarkable. Other: No supplemental non-categorized findings. Musculoskeletal: Lower lumbar spondylosis and degenerative disc disease. This is potentially causing impingement at the L4-5 level. IMPRESSION: 1. Stable mild enlargement of the previously hypermetabolic porta hepatis lymph node. Essentially stable appearance of these segment IVb hypodense lesion in the liver which was likewise previously hypermetabolic. 2. A 0.8 by 0.5 cm posterior basal segment right lower lobe pulmonary nodule is stable compared to the prior exam, but appears to of slowly increased since early 2016. This lesion was not recognizable E hypermetabolic on the recent PET-CT from 12/09/2016. 3. Chronically stable appearance of a ground-glass opacity 1.0 by 1.2 cm lesion in the right upper lobe, may merit surveillance. 4. Significant reduction in conspicuity of the previous left lower lobe nodule. 5. Other imaging findings of potential clinical significance: Aortic Atherosclerosis (ICD10-I70.0). Postoperative findings in both lungs. Coronary atherosclerosis. Cholelithiasis. Bilateral nonobstructive nephrolithiasis. Left renal cysts. Possible impingement at L4-5 due to spondylosis and degenerative disc disease. Electronically Signed   By: Van Clines M.D.   On: 05/05/2017 15:11   Dg Chest 2 View  Result Date: 05/27/2017 CLINICAL DATA:  Chest pain since last night. History of a left upper lobectomy and lung carcinoma. EXAM: CHEST  2 VIEW COMPARISON:  CT chest  05/05/2017.  PA and lateral chest 08/10/2016. FINDINGS: Port-A-Cath is again seen. No consolidative process, pneumothorax or effusion. Postoperative change of left upper lobectomy is seen. There is cardiomegaly. Atherosclerosis noted. No acute bony abnormality. IMPRESSION: No acute disease. Electronically Signed   By: Inge Rise M.D.   On: 05/27/2017 16:29   Ct Chest Wo Contrast  Result Date: 05/27/2017 CLINICAL DATA:  Fever and chills beginning last evening. Patient had chemotherapy yesterday with increasing dyspnea and chest tightness. History of colon, lung and breast cancer with metastasis. EXAM: CT CHEST WITHOUT CONTRAST TECHNIQUE: Multidetector CT imaging of the chest was performed following the standard protocol without IV contrast. COMPARISON:  05/05/2017 chest CT FINDINGS: Cardiovascular: Stable mild cardiomegaly without pericardial effusion. Aortic atherosclerosis and coronary arteriosclerosis is again noted. Mediastinum/Nodes: No enlarged mediastinal or axillary lymph nodes. Thyroid gland, trachea, and esophagus demonstrate no significant findings. Lungs/Pleura: New confluent airspace opacity with air bronchograms in the right upper lobe since recent comparison exam consistent with pneumonia. Streaky atelectasis and/or scarring is seen bilaterally within both lungs and right lung base status post wedge resections. Stable tiny subpleural nodular densities are seen in the right upper lobe measuring up to 6 mm, series 3, image 21, series 3, image 29. A 7 x 5 mm posterior right lower lobe pulmonary nodule is stable, series 3, image 93. No effusion or pneumothorax. Upper Abdomen: Partially visualized gallbladder with  19 mm gallstone. Left-sided partially imaged renal cysts that appear simple. No adrenal mass. No lymphadenopathy on images provided. Subtle hypodensity in the left hepatic lobe series 2, image 147 measuring approximately 18 mm is unchanged given lack of IV contrast for better assessment.  Stable 13 mm short axis porta hepatis lymph node. Musculoskeletal: Mild thoracic spondylosis. No acute nor suspicious osseous lesions. IMPRESSION: 1. New right upper lobe pneumonic consolidation with air bronchograms. 2. Stable postoperative scarring of the lungs with stable nodularity. 3. Stable 18 mm subtle hypodensity in the left hepatic lobe and 13 mm short axis porta hepatis lymph node. 4. Uncomplicated cholelithiasis.  Left-sided renal cysts. Electronically Signed   By: Ashley Royalty M.D.   On: 05/27/2017 17:56   Ct Chest Wo Contrast  Result Date: 05/05/2017 CLINICAL DATA:  Right lower lobe lung cancer, adenocarcinoma also found upon liver biopsy. Renal insufficiency. EXAM: CT CHEST, ABDOMEN AND PELVIS WITHOUT CONTRAST TECHNIQUE: Multidetector CT imaging of the chest, abdomen and pelvis was performed following the standard protocol without IV contrast. COMPARISON:  Multiple exams, including 03/01/2017 and PET-CT from 12/09/2016 FINDINGS: CT CHEST FINDINGS Cardiovascular: Coronary, aortic arch, and branch vessel atherosclerotic vascular disease. Mild cardiomegaly. Mediastinum/Nodes: No pathologic adenopathy is observed in the chest. Lungs/Pleura: Biapical pleuroparenchymal scarring. 1.0 by 1.2 cm ground-glass density in the right upper lobe on image 28/4, stable and relatively similar to exams back through 02/18/2015. Postoperative findings in both lungs with scarring adjacent to wedge resection sites. Scattered fine peripheral interstitial accentuation. 0.8 by 0.5 cm posterior basal segment right lower lobe pulmonary nodule on image 97/4. This is relatively similar to the most recent prior exam but measured only 3 mm in diameter back on 02/18/2015. In the superior segment left lower lobe the previously seen nodule is less distinct, with an approximately 7 mm highly indistinct sub solid nodule shown on image 57/4. Musculoskeletal: Mild thoracic spondylosis. CT ABDOMEN PELVIS FINDINGS Hepatobiliary: 2.4 cm  gallstone in the gallbladder. Hypodense lesion corresponding to the previous segment IVb hypermetabolic lesion, measuring 1.2 cm in width on image 54/2, previously measured at 1.3 cm. Previously hypermetabolic porta hepatis lymph node measures 1.2 cm in short axis, previously the same. No obvious new liver lesions. Pancreas: Unremarkable Spleen: Unremarkable Adrenals/Urinary Tract: Similar appearance of multiple left renal cysts. Several small bilateral nonobstructive renal calculi, the largest 4 mm in diameter in the right kidney lower pole. No hydronephrosis or hydroureter. Stomach/Bowel: Unremarkable Vascular/Lymphatic: Stable mildly enlarged porta hepatis lymph node as described above. Aortoiliac atherosclerotic vascular disease. No new adenopathy. Reproductive: Uterus absent.  Adnexa unremarkable. Other: No supplemental non-categorized findings. Musculoskeletal: Lower lumbar spondylosis and degenerative disc disease. This is potentially causing impingement at the L4-5 level. IMPRESSION: 1. Stable mild enlargement of the previously hypermetabolic porta hepatis lymph node. Essentially stable appearance of these segment IVb hypodense lesion in the liver which was likewise previously hypermetabolic. 2. A 0.8 by 0.5 cm posterior basal segment right lower lobe pulmonary nodule is stable compared to the prior exam, but appears to of slowly increased since early 2016. This lesion was not recognizable E hypermetabolic on the recent PET-CT from 12/09/2016. 3. Chronically stable appearance of a ground-glass opacity 1.0 by 1.2 cm lesion in the right upper lobe, may merit surveillance. 4. Significant reduction in conspicuity of the previous left lower lobe nodule. 5. Other imaging findings of potential clinical significance: Aortic Atherosclerosis (ICD10-I70.0). Postoperative findings in both lungs. Coronary atherosclerosis. Cholelithiasis. Bilateral nonobstructive nephrolithiasis. Left renal cysts. Possible impingement at  L4-5 due  to spondylosis and degenerative disc disease. Electronically Signed   By: Van Clines M.D.   On: 05/05/2017 15:11     Subjective: - no chest pain, shortness of breath, no abdominal pain, nausea or vomiting.   Discharge Exam: Vitals:   05/29/17 2132 05/30/17 0433  BP: (!) 153/92 (!) 149/60  Pulse: 98 73  Resp: 20 18  Temp: 99.7 F (37.6 C) 99.2 F (37.3 C)   Vitals:   05/29/17 2132 05/30/17 0433 05/30/17 0436 05/30/17 0741  BP: (!) 153/92 (!) 149/60    Pulse: 98 73    Resp: 20 18    Temp: 99.7 F (37.6 C) 99.2 F (37.3 C)    TempSrc: Oral Oral    SpO2: 99% 96% 99% 97%  Weight:      Height:        General: Pt is alert, awake, not in acute distress Cardiovascular: RRR, S1/S2 +, no rubs, no gallops Respiratory: CTA bilaterally, no wheezing, no rhonchi Abdominal: Soft, NT, ND, bowel sounds + Extremities: no edema, no cyanosis    The results of significant diagnostics from this hospitalization (including imaging, microbiology, ancillary and laboratory) are listed below for reference.     Microbiology: Recent Results (from the past 240 hour(s))  Urine culture     Status: Abnormal   Collection Time: 05/27/17  4:04 PM  Result Value Ref Range Status   Specimen Description URINE, CLEAN CATCH  Final   Special Requests NONE  Final   Culture MULTIPLE SPECIES PRESENT, SUGGEST RECOLLECTION (A)  Final   Report Status 05/29/2017 FINAL  Final  Culture, blood (routine x 2)     Status: None (Preliminary result)   Collection Time: 05/27/17  5:07 PM  Result Value Ref Range Status   Specimen Description PORTA CATH  Final   Special Requests   Final    BOTTLES DRAWN AEROBIC AND ANAEROBIC Blood Culture adequate volume   Culture   Final    NO GROWTH 2 DAYS Performed at [facility], [address], [place], [place] [address]    Report Status PENDING  Incomplete  Culture, blood (routine x 2)     Status: None (Preliminary result)   Collection Time: 05/27/17   6:00 PM  Result Value Ref Range Status   Specimen Description BLOOD LEFT HAND  Final   Special Requests   Final    IN PEDIATRIC BOTTLE Blood Culture results may not be optimal due to an excessive volume of blood received in culture bottles   Culture   Final    NO GROWTH 2 DAYS Performed at [facility], [place] [address], [place], [place] [address]    Report Status PENDING  Incomplete  Culture, sputum-assessment     Status: None   Collection Time: 05/28/17  9:50 AM  Result Value Ref Range Status   Specimen Description SPUTUM  Final   Special Requests Immunocompromised  Final   Sputum evaluation THIS SPECIMEN IS ACCEPTABLE FOR SPUTUM CULTURE  Final   Report Status 05/28/2017 FINAL  Final  Culture, respiratory (NON-Expectorated)     Status: None   Collection Time: 05/28/17  9:50 AM  Result Value Ref Range Status   Specimen Description SPUTUM  Final   Special Requests Immunocompromised Reflexed from [id number]  Final   Gram Stain   Final    RARE WBC PRESENT, PREDOMINANTLY PMN FEW GRAM POSITIVE COCCI IN CHAINS RARE GRAM NEGATIVE RODS    Culture   Final    Consistent  with normal respiratory flora. Performed at [facility], [place] [address], [place], [place] [address]    Report Status 05/30/2017 FINAL  Final     Labs: BNP (last 3 results) No results for input(s): BNP in the last 8760 hours. Basic Metabolic Panel:  Recent Labs Lab 05/26/17 1118 05/27/17 1707 05/28/17 0600 05/29/17 0829 05/30/17 0417  NA 143 141 144 141  --   K 4.1 4.1 3.9 4.4  --   CL  --  105 108 108  --   CO2 25 27 27 24   --   GLUCOSE 110 109* 130* 137*  --   BUN 34.8* 31* 28* 27*  --   CREATININE 1.8* 1.76* 1.71* 1.74* 1.55*  CALCIUM 9.2 8.8* 8.2* 8.9  --   MG  --   --  1.7  --   --   PHOS  --   --  2.7  --   --    Liver Function Tests:  Recent Labs Lab 05/26/17 1118 05/27/17 1707 05/28/17 0600  AST 12 16 17   ALT 9 12* 13*  ALKPHOS 65 52 56  BILITOT 0.81 0.8 0.4  PROT 7.0 6.5 6.0*    ALBUMIN 3.4* 3.2* 2.8*   No results for input(s): LIPASE, AMYLASE in the last 168 hours. No results for input(s): AMMONIA in the last 168 hours. CBC:  Recent Labs Lab 05/26/17 1118 05/27/17 1707 05/28/17 0600 05/29/17 0829  WBC 8.5 9.5 8.7 8.9  NEUTROABS 6.8* 7.4  --   --   HGB 12.8 11.8* 11.1* 13.2  HCT 40.3 37.1 35.8* 41.3  MCV 84.8 84.3 85.0 85.0  PLT 162 173 163 153   Cardiac Enzymes:  Recent Labs Lab 05/27/17 1707  TROPONINI <0.03   BNP: Invalid input(s): POCBNP CBG: No results for input(s): GLUCAP in the last 168 hours. D-Dimer No results for input(s): DDIMER in the last 72 hours. Hgb A1c No results for input(s): HGBA1C in the last 72 hours. Lipid Profile No results for input(s): CHOL, HDL, LDLCALC, TRIG, CHOLHDL, LDLDIRECT in the last 72 hours. Thyroid function studies  Recent Labs  05/28/17 0600  TSH 3.388   Anemia work up No results for input(s): VITAMINB12, FOLATE, FERRITIN, TIBC, IRON, RETICCTPCT in the last 72 hours. Urinalysis    Component Value Date/Time   COLORURINE YELLOW 05/27/2017 1601   APPEARANCEUR CLEAR 05/27/2017 1601   LABSPEC 1.015 05/27/2017 1601   LABSPEC 1.020 03/03/2017 1501   PHURINE 5.0 05/27/2017 1601   GLUCOSEU NEGATIVE 05/27/2017 1601   GLUCOSEU Negative 03/03/2017 1501   HGBUR NEGATIVE 05/27/2017 1601   BILIRUBINUR NEGATIVE 05/27/2017 1601   BILIRUBINUR Negative 03/03/2017 1501   KETONESUR NEGATIVE 05/27/2017 1601   PROTEINUR NEGATIVE 05/27/2017 1601   UROBILINOGEN 0.2 03/03/2017 1501   NITRITE NEGATIVE 05/27/2017 1601   LEUKOCYTESUR SMALL (A) 05/27/2017 1601   LEUKOCYTESUR Small 03/03/2017 1501   Sepsis Labs Invalid input(s): PROCALCITONIN,  WBC,  LACTICIDVEN Microbiology Recent Results (from the past 240 hour(s))  Urine culture     Status: Abnormal   Collection Time: 05/27/17  4:04 PM  Result Value Ref Range Status   Specimen Description URINE, CLEAN CATCH  Final   Special Requests NONE  Final   Culture  MULTIPLE SPECIES PRESENT, SUGGEST RECOLLECTION (A)  Final   Report Status 05/29/2017 FINAL  Final  Culture, blood (routine x 2)     Status: None (Preliminary result)   Collection Time: 05/27/17  5:07 PM  Result Value Ref Range Status  Specimen Description PORTA CATH  Final   Special Requests   Final    BOTTLES DRAWN AEROBIC AND ANAEROBIC Blood Culture adequate volume   Culture   Final    NO GROWTH 2 DAYS Performed at [facility], [address] [place], [place] [address]    Report Status PENDING  Incomplete  Culture, blood (routine x 2)     Status: None (Preliminary result)   Collection Time: 05/27/17  6:00 PM  Result Value Ref Range Status   Specimen Description BLOOD LEFT HAND  Final   Special Requests   Final    IN PEDIATRIC BOTTLE Blood Culture results may not be optimal due to an excessive volume of blood received in culture bottles   Culture   Final    NO GROWTH 2 DAYS Performed at [facility], [place] [address] [place], [place] [address]    Report Status PENDING  Incomplete  Culture, sputum-assessment     Status: None   Collection Time: 05/28/17  9:50 AM  Result Value Ref Range Status   Specimen Description SPUTUM  Final   Special Requests Immunocompromised  Final   Sputum evaluation THIS SPECIMEN IS ACCEPTABLE FOR SPUTUM CULTURE  Final   Report Status 05/28/2017 FINAL  Final  Culture, respiratory (NON-Expectorated)     Status: None   Collection Time: 05/28/17  9:50 AM  Result Value Ref Range Status   Specimen Description SPUTUM  Final   Special Requests Immunocompromised Reflexed from [id number]  Final   Gram Stain   Final    RARE WBC PRESENT, PREDOMINANTLY PMN FEW GRAM POSITIVE COCCI IN CHAINS RARE GRAM NEGATIVE RODS    Culture   Final    Consistent with normal respiratory flora. Performed at [facility], [place] [address] [place], [place] [address]    Report Status 05/30/2017 FINAL  Final     Time coordinating discharge: 25  minutes  SIGNED:  Marzetta Board, MD  [facility] 05/30/2017, 12:39 PM Pager [phone]  If 7PM-7AM, please contact night-coverage [email or web address] Password [email or web address]

## 2017-05-30 NOTE — Progress Notes (Signed)
Patient discharged to home, all discharge medications and instructions reviewed and questions answered.  Patient to be assisted to vehicle by wheelchair.  

## 2017-05-30 NOTE — Discharge Instructions (Addendum)
Follow with Tammie Maize, MD in 5-7 days  Please have your INR checked in 3 days  Please get a complete blood count and chemistry panel checked by your Primary MD at your next visit, and again as instructed by your Primary MD. Please get your medications reviewed and adjusted by your Primary MD.  Please request your Primary MD to go over all Hospital Tests and Procedure/Radiological results at the follow up, please get all Hospital records sent to your Prim MD by signing hospital release before you go home.  If you had Pneumonia of Lung problems at the Hospital: Please get a 2 view Chest X ray done in 6-8 weeks after hospital discharge or sooner if instructed by your Primary MD.  If you have Congestive Heart Failure: Please call your Cardiologist or Primary MD anytime you have any of the following symptoms:  1) 3 pound weight gain in 24 hours or 5 pounds in 1 week  2) shortness of breath, with or without a dry hacking cough  3) swelling in the hands, feet or stomach  4) if you have to sleep on extra pillows at night in order to breathe  Follow cardiac low salt diet and 1.5 lit/day fluid restriction.  If you have diabetes Accuchecks 4 times/day, Once in AM empty stomach and then before each meal. Log in all results and show them to your primary doctor at your next visit. If any glucose reading is under 80 or above 300 call your primary MD immediately.  If you have Seizure/Convulsions/Epilepsy: Please do not drive, operate heavy machinery, participate in activities at heights or participate in high speed sports until you have seen by Primary MD or a Neurologist and advised to do so again.  If you had Gastrointestinal Bleeding: Please ask your Primary MD to check a complete blood count within one week of discharge or at your next visit. Your endoscopic/colonoscopic biopsies that are pending at the time of discharge, will also need to followed by your Primary MD.  Get Medicines  reviewed and adjusted. Please take all your medications with you for your next visit with your Primary MD  Please request your Primary MD to go over all hospital tests and procedure/radiological results at the follow up, please ask your Primary MD to get all Hospital records sent to his/her office.  If you experience worsening of your admission symptoms, develop shortness of breath, life threatening emergency, suicidal or homicidal thoughts you must seek medical attention immediately by calling 911 or calling your MD immediately  if symptoms less severe.  You must read complete instructions/literature along with all the possible adverse reactions/side effects for all the Medicines you take and that have been prescribed to you. Take any new Medicines after you have completely understood and accpet all the possible adverse reactions/side effects.   Do not drive or operate heavy machinery when taking Pain medications.   Do not take more than prescribed Pain, Sleep and Anxiety Medications  Special Instructions: If you have smoked or chewed Tobacco  in the last 2 yrs please stop smoking, stop any regular Alcohol  and or any Recreational drug use.  Wear Seat belts while driving.  Please note You were cared for by a hospitalist during your hospital stay. If you have any questions about your discharge medications or the care you received while you were in the hospital after you are discharged, you can call the unit and asked to speak with the hospitalist on call if the  hospitalist that took care of you is not available. Once you are discharged, your primary care physician will handle any further medical issues. Please note that NO REFILLS for any discharge medications will be authorized once you are discharged, as it is imperative that you return to your primary care physician (or establish a relationship with a primary care physician if you do not have one) for your aftercare needs so that they can  reassess your need for medications and monitor your lab values.  You can reach the hospitalist office at phone [phone] or fax [phone]   If you do not have a primary care physician, you can call [phone] for a physician referral.  Activity: As tolerated with Full fall precautions use walker/cane & assistance as needed  Diet: regular  Disposition Home

## 2017-05-30 NOTE — Progress Notes (Signed)
SATURATION QUALIFICATIONS: (This note is used to comply with regulatory documentation for home oxygen)  Patient Saturations on Room Air at Rest = 96%  Patient Saturations on Room Air while Ambulating = 90-93%  :

## 2017-05-31 ENCOUNTER — Ambulatory Visit (INDEPENDENT_AMBULATORY_CARE_PROVIDER_SITE_OTHER): Payer: PPO | Admitting: Pharmacist

## 2017-05-31 DIAGNOSIS — Z7901 Long term (current) use of anticoagulants: Secondary | ICD-10-CM

## 2017-05-31 DIAGNOSIS — I4891 Unspecified atrial fibrillation: Secondary | ICD-10-CM | POA: Diagnosis not present

## 2017-05-31 LAB — COAGUCHEK XS/INR WAIVED
INR: 2.8 — ABNORMAL HIGH (ref 0.9–1.1)
PROTHROMBIN TIME: 33 s

## 2017-06-01 LAB — CULTURE, BLOOD (ROUTINE X 2)
CULTURE: NO GROWTH
Culture: NO GROWTH
Special Requests: ADEQUATE

## 2017-06-02 LAB — COAGUCHEK XS/INR WAIVED

## 2017-06-03 ENCOUNTER — Encounter: Payer: Self-pay | Admitting: Pediatrics

## 2017-06-03 ENCOUNTER — Telehealth: Payer: Self-pay | Admitting: Medical Oncology

## 2017-06-03 ENCOUNTER — Ambulatory Visit (INDEPENDENT_AMBULATORY_CARE_PROVIDER_SITE_OTHER): Payer: PPO | Admitting: Pediatrics

## 2017-06-03 VITALS — BP 133/77 | HR 75 | Temp 97.4°F | Resp 22 | Ht 71.0 in | Wt 267.0 lb

## 2017-06-03 DIAGNOSIS — I4891 Unspecified atrial fibrillation: Secondary | ICD-10-CM | POA: Diagnosis not present

## 2017-06-03 DIAGNOSIS — I10 Essential (primary) hypertension: Secondary | ICD-10-CM | POA: Diagnosis not present

## 2017-06-03 DIAGNOSIS — J189 Pneumonia, unspecified organism: Secondary | ICD-10-CM | POA: Diagnosis not present

## 2017-06-03 LAB — COAGUCHEK XS/INR WAIVED
INR: 2.9 — ABNORMAL HIGH (ref 0.9–1.1)
Prothrombin Time: 34.7 s

## 2017-06-03 NOTE — Telephone Encounter (Signed)
Pt out to doctors office appt and husband said she is doing better.

## 2017-06-03 NOTE — Progress Notes (Signed)
  Subjective:   Patient ID: Tammie [name], female    DOB: 11-Apr-1941, 76 y.o.   MRN: [id number] CC: Hospitalization Follow-up  HPI: Tammie [name] is a 76 y.o. female presenting for Hospitalization Follow-up  The patient was discharged from [facility] on 6/4 with a primary diagnosis of HCAP.   Recent admit for pneumonia, found on CT scan Still with night sweats Temp staying around 97 at home now Has one day left of abx Has had to use oxygen at home during the day twice for SOB since discharge, last a couple days ago Uses at night regularly for hypoxia No fevers, energy level returning but not back to normal yet, finds her exercsie tolerance decreased still Appetite has been ok  Receiving chemotherapy currently, multiple cancers including NSCLC, metastatic cancer to the liver with unknown primary.  H/o Afib, on coumadin. Had INR check 3 days ago since starting coumadin, was 2.8, held one day of warfarin  Relevant past medical, surgical, family and social history reviewed. Allergies and medications reviewed and updated. History  Smoking Status  . Former Smoker  . Packs/day: 3.00  . Years: 32.00  . Types: Cigarettes  . Start date: 12/27/1989  . Quit date: 03/08/1990  Smokeless Tobacco  . Former Systems developer  . Quit date: 03/08/1990    Comment: smoked 3ppd from 1959-1991    ROS: Per HPI   Objective:    BP 133/77   Pulse 75   Temp 97.4 F (36.3 C) (Oral)   Resp (!) 22   Ht _0  (1.803 m)   Wt 267 lb (121.1 kg)   SpO2 94%   BMI 37.24 kg/m   Wt Readings from Last 3 Encounters:  06/03/17 267 lb (121.1 kg)  05/27/17 269 lb 13.5 oz (122.4 kg)  05/26/17 269 lb 12.8 oz (122.4 kg)    Gen: NAD, alert, cooperative with exam, NCAT EYES: EOMI, no conjunctival injection, or no icterus ENT:   OP without erythema CV: NRRR, normal S1/S2 Resp: CTABL, no wheezes, no crackles, normal WOB, speaking in complete sentences Abd: +BS, soft, NTND. Neuro: Alert and oriented Psych: normal affect,  says her mood has been fine  Assessment & Plan:  Tammie [name] was seen today for hospitalization follow-up.  Diagnoses and all orders for this visit:  Atrial fibrillation with RVR (HCC) INR 2.9 today, on reg schedule doesn't take warfarin tonight Tomorrow last day of abx, rtc next week for INR check -     CoaguChek XS/INR Waived  Essential hypertension Adequate control, on furosemide primarily for swelling, metoprolol for rate control -     BMP8+EGFR  HCAP (healthcare-associated pneumonia) Still with some SOB at home but improving One day left of levaquin Any return of symptoms let me know -     CBC with Differential/Platelet   Follow up plan: Return in about 2 months (around 08/03/2017). Tammie [name]

## 2017-06-04 LAB — CBC WITH DIFFERENTIAL/PLATELET
BASOS ABS: 0.1 10*3/uL (ref 0.0–0.2)
Basos: 1 %
EOS (ABSOLUTE): 0.2 10*3/uL (ref 0.0–0.4)
Eos: 4 %
HEMOGLOBIN: 11.6 g/dL (ref 11.1–15.9)
Hematocrit: 37.5 % (ref 34.0–46.6)
Immature Grans (Abs): 0 10*3/uL (ref 0.0–0.1)
Immature Granulocytes: 0 %
LYMPHS ABS: 0.9 10*3/uL (ref 0.7–3.1)
Lymphs: 15 %
MCH: 26.2 pg — AB (ref 26.6–33.0)
MCHC: 30.9 g/dL — AB (ref 31.5–35.7)
MCV: 85 fL (ref 79–97)
MONOCYTES: 10 %
MONOS ABS: 0.6 10*3/uL (ref 0.1–0.9)
NEUTROS ABS: 4.1 10*3/uL (ref 1.4–7.0)
Neutrophils: 70 %
Platelets: 340 10*3/uL (ref 150–379)
RBC: 4.43 x10E6/uL (ref 3.77–5.28)
RDW: 14.9 % (ref 12.3–15.4)
WBC: 5.8 10*3/uL (ref 3.4–10.8)

## 2017-06-04 LAB — BMP8+EGFR
BUN / CREAT RATIO: 13 (ref 12–28)
BUN: 20 mg/dL (ref 8–27)
CHLORIDE: 106 mmol/L (ref 96–106)
CO2: 26 mmol/L (ref 18–29)
Calcium: 9 mg/dL (ref 8.7–10.3)
Creatinine, Ser: 1.56 mg/dL — ABNORMAL HIGH (ref 0.57–1.00)
GFR calc non Af Amer: 32 mL/min/{1.73_m2} — ABNORMAL LOW (ref >59)
GFR, EST AFRICAN AMERICAN: 37 mL/min/{1.73_m2} — AB (ref >59)
GLUCOSE: 90 mg/dL (ref 65–99)
POTASSIUM: 5 mmol/L (ref 3.5–5.2)
SODIUM: 146 mmol/L — AB (ref 134–144)

## 2017-06-09 ENCOUNTER — Ambulatory Visit (INDEPENDENT_AMBULATORY_CARE_PROVIDER_SITE_OTHER): Payer: PPO | Admitting: Pharmacist

## 2017-06-09 DIAGNOSIS — Z7901 Long term (current) use of anticoagulants: Secondary | ICD-10-CM

## 2017-06-09 DIAGNOSIS — I4891 Unspecified atrial fibrillation: Secondary | ICD-10-CM

## 2017-06-09 LAB — COAGUCHEK XS/INR WAIVED
INR: 2.2 — ABNORMAL HIGH (ref 0.9–1.1)
Prothrombin Time: 26.1 s

## 2017-06-16 ENCOUNTER — Other Ambulatory Visit (HOSPITAL_BASED_OUTPATIENT_CLINIC_OR_DEPARTMENT_OTHER): Payer: PPO

## 2017-06-16 ENCOUNTER — Encounter: Payer: Self-pay | Admitting: Internal Medicine

## 2017-06-16 ENCOUNTER — Other Ambulatory Visit: Payer: Self-pay | Admitting: *Deleted

## 2017-06-16 ENCOUNTER — Telehealth: Payer: Self-pay | Admitting: Internal Medicine

## 2017-06-16 ENCOUNTER — Ambulatory Visit (HOSPITAL_BASED_OUTPATIENT_CLINIC_OR_DEPARTMENT_OTHER): Payer: PPO | Admitting: Internal Medicine

## 2017-06-16 ENCOUNTER — Ambulatory Visit (HOSPITAL_BASED_OUTPATIENT_CLINIC_OR_DEPARTMENT_OTHER): Payer: PPO

## 2017-06-16 VITALS — BP 148/88 | HR 56

## 2017-06-16 VITALS — BP 184/99 | HR 70 | Temp 98.3°F | Resp 20 | Ht 71.0 in | Wt 274.0 lb

## 2017-06-16 DIAGNOSIS — Z853 Personal history of malignant neoplasm of breast: Secondary | ICD-10-CM | POA: Diagnosis not present

## 2017-06-16 DIAGNOSIS — C229 Malignant neoplasm of liver, not specified as primary or secondary: Secondary | ICD-10-CM

## 2017-06-16 DIAGNOSIS — Z7901 Long term (current) use of anticoagulants: Secondary | ICD-10-CM

## 2017-06-16 DIAGNOSIS — C787 Secondary malignant neoplasm of liver and intrahepatic bile duct: Secondary | ICD-10-CM

## 2017-06-16 DIAGNOSIS — Z5181 Encounter for therapeutic drug level monitoring: Secondary | ICD-10-CM

## 2017-06-16 DIAGNOSIS — C801 Malignant (primary) neoplasm, unspecified: Secondary | ICD-10-CM

## 2017-06-16 DIAGNOSIS — Z85118 Personal history of other malignant neoplasm of bronchus and lung: Secondary | ICD-10-CM

## 2017-06-16 DIAGNOSIS — I10 Essential (primary) hypertension: Secondary | ICD-10-CM

## 2017-06-16 DIAGNOSIS — Z5112 Encounter for antineoplastic immunotherapy: Secondary | ICD-10-CM

## 2017-06-16 DIAGNOSIS — C3431 Malignant neoplasm of lower lobe, right bronchus or lung: Secondary | ICD-10-CM

## 2017-06-16 LAB — COMPREHENSIVE METABOLIC PANEL
ALT: 13 U/L (ref 0–55)
ANION GAP: 11 meq/L (ref 3–11)
AST: 17 U/L (ref 5–34)
Albumin: 3.6 g/dL (ref 3.5–5.0)
Alkaline Phosphatase: 71 U/L (ref 40–150)
BUN: 23.3 mg/dL (ref 7.0–26.0)
CHLORIDE: 106 meq/L (ref 98–109)
CO2: 27 mEq/L (ref 22–29)
CREATININE: 1.5 mg/dL — AB (ref 0.6–1.1)
Calcium: 9.6 mg/dL (ref 8.4–10.4)
EGFR: 35 mL/min/{1.73_m2} — ABNORMAL LOW (ref >90)
Glucose: 99 mg/dl (ref 70–140)
POTASSIUM: 4.2 meq/L (ref 3.5–5.1)
Sodium: 144 mEq/L (ref 136–145)
Total Bilirubin: 0.67 mg/dL (ref 0.20–1.20)
Total Protein: 7.2 g/dL (ref 6.4–8.3)

## 2017-06-16 LAB — CBC WITH DIFFERENTIAL/PLATELET
BASO%: 1.8 % (ref 0.0–2.0)
BASOS ABS: 0.1 10*3/uL (ref 0.0–0.1)
EOS ABS: 0.2 10*3/uL (ref 0.0–0.5)
EOS%: 2.7 % (ref 0.0–7.0)
HEMATOCRIT: 41.7 % (ref 34.8–46.6)
HGB: 13.4 g/dL (ref 11.6–15.9)
LYMPH#: 0.9 10*3/uL (ref 0.9–3.3)
LYMPH%: 13.8 % — ABNORMAL LOW (ref 14.0–49.7)
MCH: 26.7 pg (ref 25.1–34.0)
MCHC: 32 g/dL (ref 31.5–36.0)
MCV: 83.4 fL (ref 79.5–101.0)
MONO#: 0.5 10*3/uL (ref 0.1–0.9)
MONO%: 7.4 % (ref 0.0–14.0)
NEUT#: 5 10*3/uL (ref 1.5–6.5)
NEUT%: 74.3 % (ref 38.4–76.8)
PLATELETS: 170 10*3/uL (ref 145–400)
RBC: 5.01 10*6/uL (ref 3.70–5.45)
RDW: 15.3 % — ABNORMAL HIGH (ref 11.2–14.5)
WBC: 6.7 10*3/uL (ref 3.9–10.3)

## 2017-06-16 LAB — PROTIME-INR
INR: 1.7 — ABNORMAL LOW (ref 2.00–3.50)
Protime: 20.4 Seconds — ABNORMAL HIGH (ref 10.6–13.4)

## 2017-06-16 LAB — TSH: TSH: 2.867 m(IU)/L (ref 0.308–3.960)

## 2017-06-16 MED ORDER — CLONIDINE HCL 0.1 MG PO TABS
0.2000 mg | ORAL_TABLET | Freq: Once | ORAL | Status: AC
  Administered 2017-06-16: 0.2 mg via ORAL

## 2017-06-16 MED ORDER — SODIUM CHLORIDE 0.9 % IV SOLN
Freq: Once | INTRAVENOUS | Status: AC
  Administered 2017-06-16: 14:00:00 via INTRAVENOUS

## 2017-06-16 MED ORDER — SODIUM CHLORIDE 0.9 % IV SOLN
200.0000 mg | Freq: Once | INTRAVENOUS | Status: AC
  Administered 2017-06-16: 200 mg via INTRAVENOUS
  Filled 2017-06-16: qty 8

## 2017-06-16 MED ORDER — HEPARIN SOD (PORK) LOCK FLUSH 100 UNIT/ML IV SOLN
500.0000 [IU] | Freq: Once | INTRAVENOUS | Status: AC | PRN
  Administered 2017-06-16: 500 [IU]
  Filled 2017-06-16: qty 5

## 2017-06-16 MED ORDER — CLONIDINE HCL 0.1 MG PO TABS
ORAL_TABLET | ORAL | Status: AC
  Filled 2017-06-16: qty 2

## 2017-06-16 MED ORDER — SODIUM CHLORIDE 0.9% FLUSH
10.0000 mL | INTRAVENOUS | Status: DC | PRN
  Administered 2017-06-16: 10 mL
  Filled 2017-06-16: qty 10

## 2017-06-16 NOTE — Progress Notes (Signed)
[id number]-cb 06/21 to schedule a follow up appt due to elevated BP at oncologist

## 2017-06-16 NOTE — Telephone Encounter (Signed)
Scheduled appt per 6/21 los - Gave patient AVS and calender per los. - [facility] to contact patient with ct schedule.

## 2017-06-16 NOTE — Progress Notes (Signed)
Can you schedule pt follow up appt with me for hypertension? Was very elevated at recent oncology appt. Appt here can be when she would usually come in for INR check, can do the INR check that day as well. She should check BPs at home if she can until that visit, write numbers down, bring to office.
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
## 2017-06-16 NOTE — Patient Instructions (Signed)
Pembrolizumab injection  What is this medicine?  PEMBROLIZUMAB (pem broe liz ue mab) is a monoclonal antibody. It is used to treat melanoma, head and neck cancer, Hodgkin lymphoma, non-small cell lung cancer, urothelial cancer, stomach cancer, and cancers that have a certain genetic condition.  This medicine may be used for other purposes; ask your health care provider or pharmacist if you have questions.  COMMON BRAND NAME(S): Keytruda  What should I tell my health care provider before I take this medicine?  They need to know if you have any of these conditions:  -diabetes  -immune system problems  -inflammatory bowel disease  -liver disease  -lung or breathing disease  -lupus  -organ transplant  -an unusual or allergic reaction to pembrolizumab, other medicines, foods, dyes, or preservatives  -pregnant or trying to get pregnant  -breast-feeding  How should I use this medicine?  This medicine is for infusion into a vein. It is given by a health care professional in a hospital or clinic setting.  A special MedGuide will be given to you before each treatment. Be sure to read this information carefully each time.  Talk to your pediatrician regarding the use of this medicine in children. While this drug may be prescribed for selected conditions, precautions do apply.  Overdosage: If you think you have taken too much of this medicine contact a poison control center or emergency room at once.  NOTE: This medicine is only for you. Do not share this medicine with others.  What if I miss a dose?  It is important not to miss your dose. Call your doctor or health care professional if you are unable to keep an appointment.  What may interact with this medicine?  Interactions have not been studied.  Give your health care provider a list of all the medicines, herbs, non-prescription drugs, or dietary supplements you use. Also tell them if you smoke, drink alcohol, or use illegal drugs. Some items may interact with your  medicine.  This list may not describe all possible interactions. Give your health care provider a list of all the medicines, herbs, non-prescription drugs, or dietary supplements you use. Also tell them if you smoke, drink alcohol, or use illegal drugs. Some items may interact with your medicine.  What should I watch for while using this medicine?  Your condition will be monitored carefully while you are receiving this medicine.  You may need blood work done while you are taking this medicine.  Do not become pregnant while taking this medicine or for 4 months after stopping it. Women should inform their doctor if they wish to become pregnant or think they might be pregnant. There is a potential for serious side effects to an unborn child. Talk to your health care professional or pharmacist for more information. Do not breast-feed an infant while taking this medicine or for 4 months after the last dose.  What side effects may I notice from receiving this medicine?  Side effects that you should report to your doctor or health care professional as soon as possible:  -allergic reactions like skin rash, itching or hives, swelling of the face, lips, or tongue  -bloody or black, tarry  -breathing problems  -changes in vision  -chest pain  -chills  -constipation  -cough  -dizziness or feeling faint or lightheaded  -fast or irregular heartbeat  -fever  -flushing  -hair loss  -low blood counts - this medicine may decrease the number of white blood cells, red blood cells   and platelets. You may be at increased risk for infections and bleeding.  -muscle pain  -muscle weakness  -persistent headache  -signs and symptoms of high blood sugar such as dizziness; dry mouth; dry skin; fruity breath; nausea; stomach pain; increased hunger or thirst; increased urination  -signs and symptoms of kidney injury like trouble passing urine or change in the amount of urine  -signs and symptoms of liver injury like dark urine, light-colored  stools, loss of appetite, nausea, right upper belly pain, yellowing of the eyes or skin  -stomach pain  -sweating  -weight loss  Side effects that usually do not require medical attention (report to your doctor or health care professional if they continue or are bothersome):  -decreased appetite  -diarrhea  -tiredness  This list may not describe all possible side effects. Call your doctor for medical advice about side effects. You may report side effects to FDA at 1-800-FDA-1088.  Where should I keep my medicine?  This drug is given in a hospital or clinic and will not be stored at home.  NOTE: This sheet is a summary. It may not cover all possible information. If you have questions about this medicine, talk to your doctor, pharmacist, or health care provider.   2018 Elsevier/Gold Standard (2016-09-21 12:29:36)

## 2017-06-17 ENCOUNTER — Telehealth: Payer: Self-pay | Admitting: Pediatrics

## 2017-06-17 MED ORDER — AMLODIPINE BESYLATE 5 MG PO TABS: 5.0000 mg | ORAL_TABLET | Freq: Every day | ORAL | 3 refills | Status: DC

## 2017-06-17 NOTE — Telephone Encounter (Signed)
DR Evette Doffing,  I called pt and I gave her an appt for Monday at 10 am -- is it ok for her to wait until then??

## 2017-06-17 NOTE — Telephone Encounter (Addendum)
Spoke with pt, no HA now, had HA yesterday with elevated BP prior to chemo. Got clonidine before chemo. On metoprolol alone now for HTN. Sent in amlodipine 5mg . Has appt with me on Monday.

## 2017-06-17 NOTE — Addendum Note (Signed)
Addended by: [name] on: 06/17/2017 01:46 PM   Modules accepted: Orders

## 2017-06-18 IMAGING — CT CT CHEST W/O CM
2 of 4 series · 14 of 36 positions shown, 17 images · non-contrast
Comparison: Chest CT 07/23/2015.

CLINICAL DATA: 74-year-old female with shortness of breath for the
past 2 months. History of right-sided breast cancer status post
right lumpectomy. Additional history of lung cancer in 5865 and 6600
status post chemotherapy and radiation therapy now complete.

EXAM:
CT CHEST WITHOUT CONTRAST
TECHNIQUE: Multidetector CT imaging of the chest was performed following the
standard protocol without IV contrast.

[Series 2: chest w/o st · axial · non-contrast · 0.82mm/px · z∈[-294,-24]mm · 11 of 64 slices shown, 14 images]
[im 5/64  mediastinal]
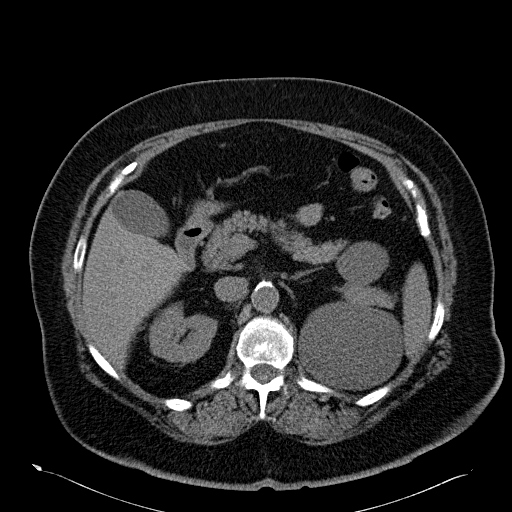
[im 5/64  lung]
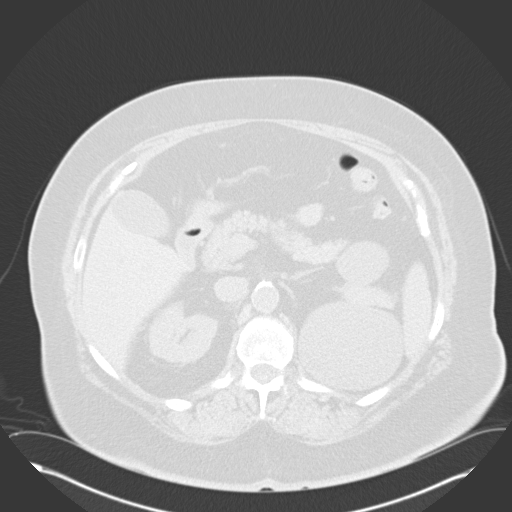
[im 10/64  lung]
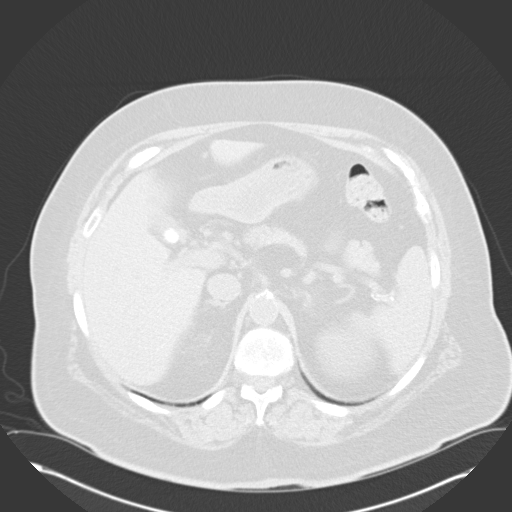
[im 15/64  lung]
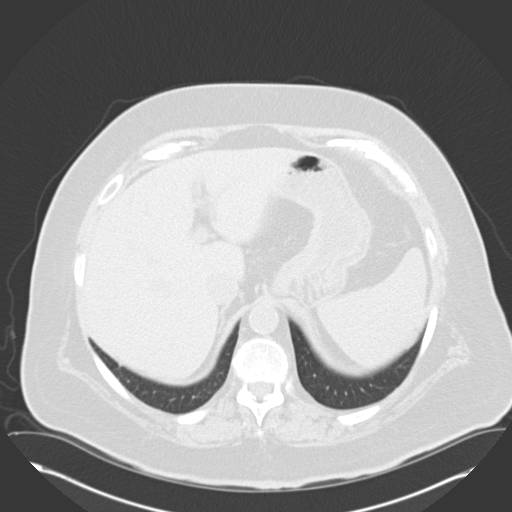
[im 20/64  lung]
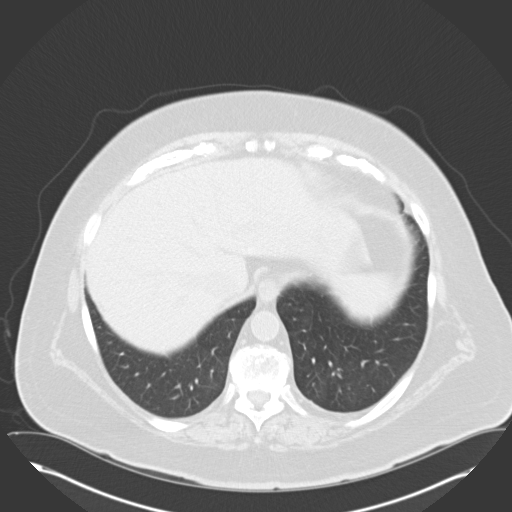
[im 25/64  mediastinal]
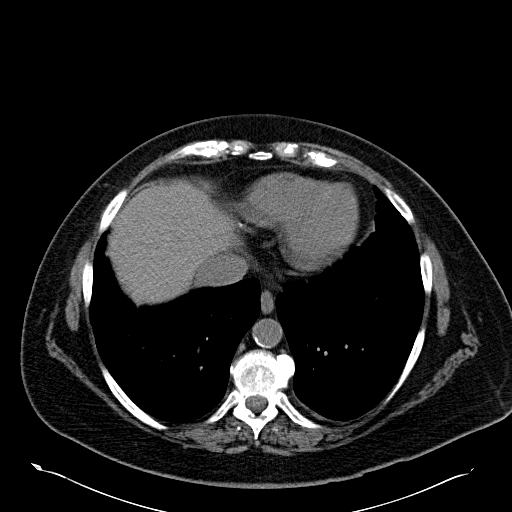
[im 25/64  lung]
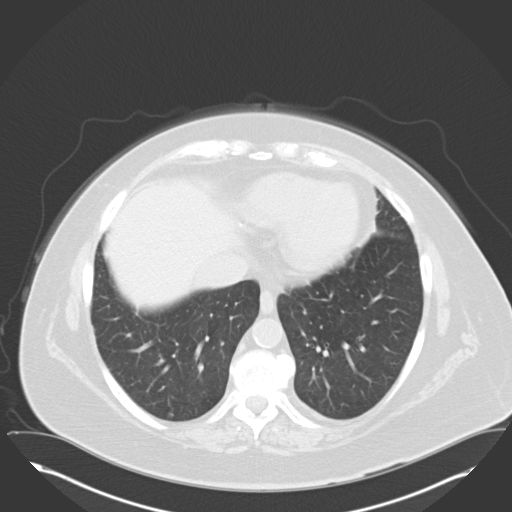
[im 34/64  lung]
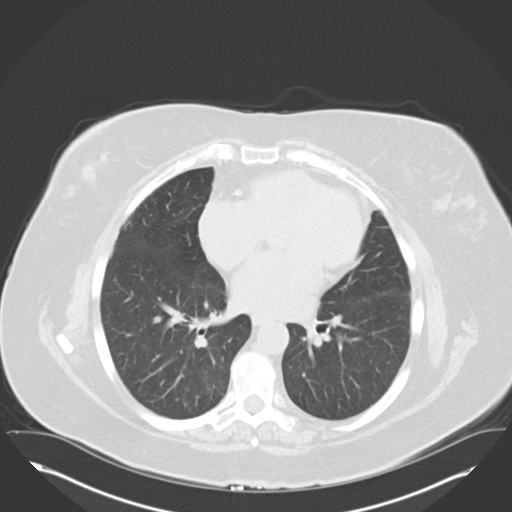
[im 39/64  lung]
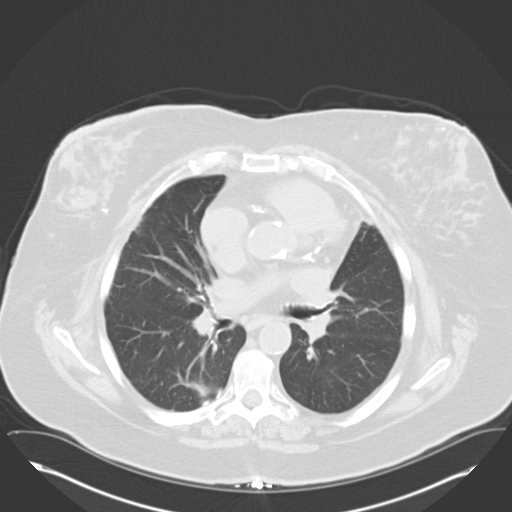
[im 44/64  lung]
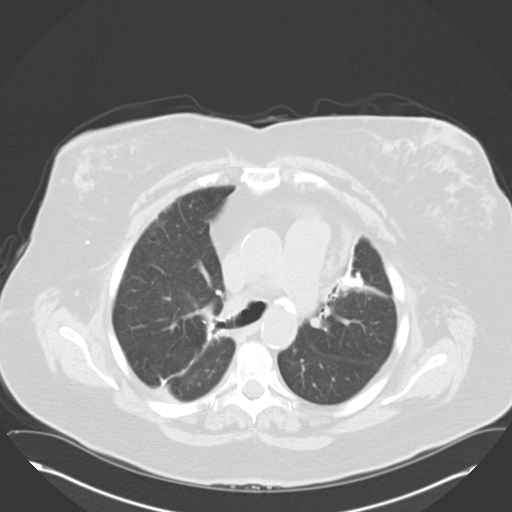
[im 49/64  mediastinal]
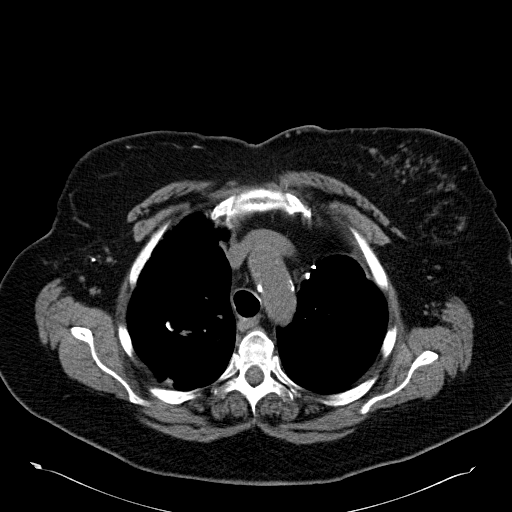
[im 49/64  lung]
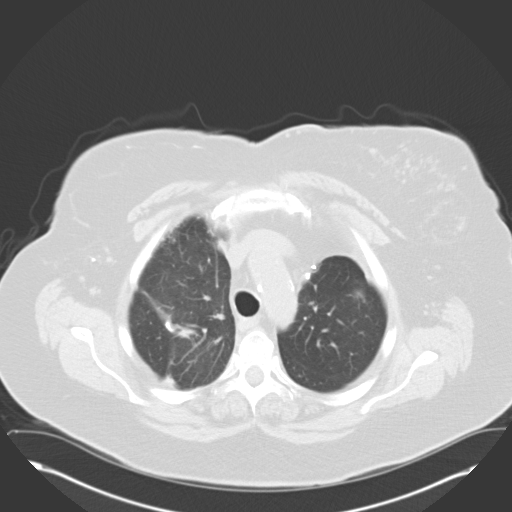
[im 54/64  lung]
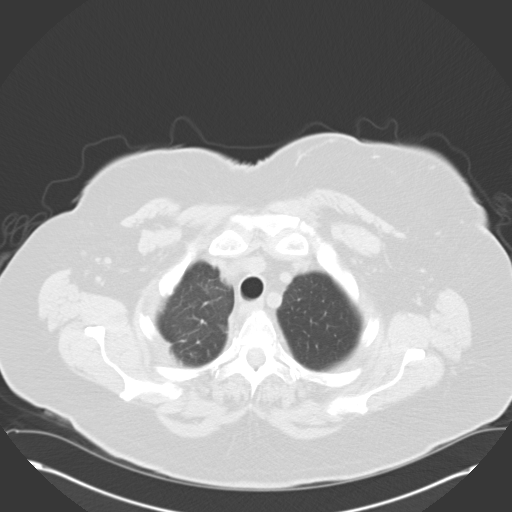
[im 59/64  lung]
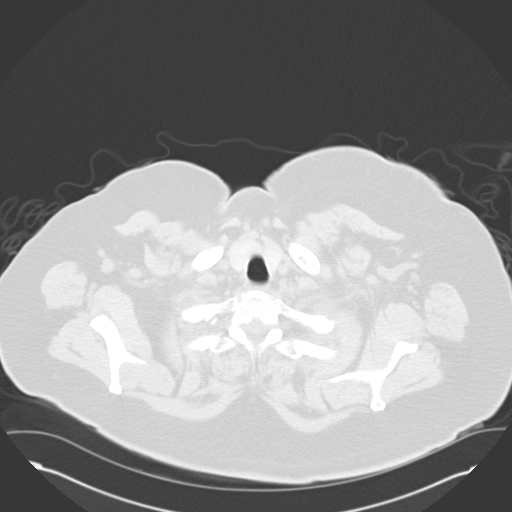

[Series 602: <mpr thick range> · coronal · 0.82mm/px · 3 of 104 slices shown]
[im 21/104  lung]
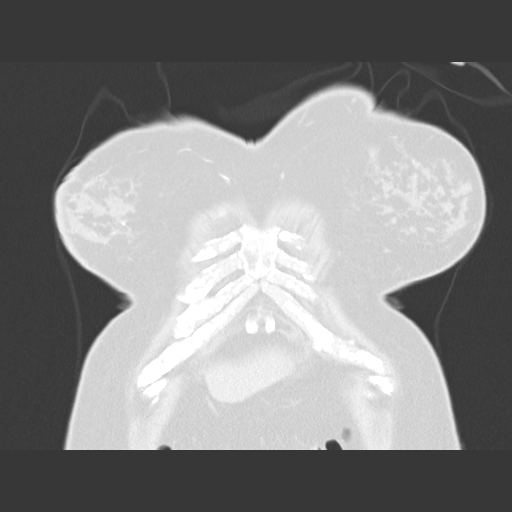
[im 42/104  lung]
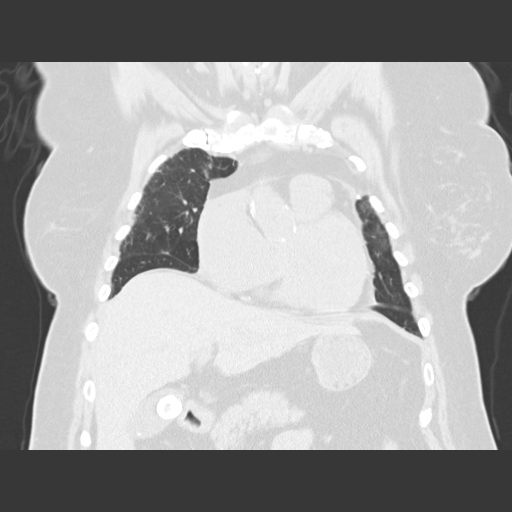
[im 62/104  lung]
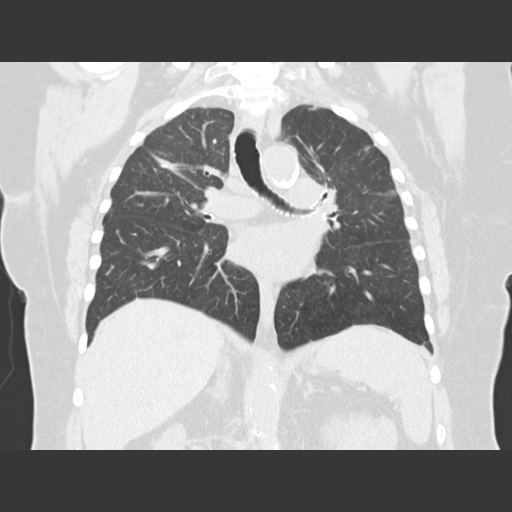

[14 of 36 positions shown; findings below may reference images not displayed]

FINDINGS: Mediastinum/Lymph Nodes: Heart size is mildly enlarged with left
atrial dilatation. There is no significant pericardial fluid,
thickening or pericardial calcification. There is atherosclerosis of
the thoracic aorta, the great vessels of the mediastinum and the
coronary arteries, including calcified atherosclerotic plaque in the
left main, left anterior descending, left circumflex and right
coronary arteries. Calcifications of the aortic valve. Dilatation of
the pulmonic trunk (3.4 cm in diameter). No pathologically enlarged
mediastinal or hilar lymph nodes. Please note that accurate
exclusion of hilar adenopathy is limited on noncontrast CT scans.
Esophagus is unremarkable in appearance. No axillary
lymphadenopathy.

Lungs/Pleura: Postoperative changes of wedge resection are noted in
the upper lobes of the lungs bilaterally. There is also evidence of
wedge resection in the posterior aspect of the right lower lobe.
Interval enlargement of a 3 mm nodule in the posterior right lower
lobe, which currently measures 7 x 5 mm (image 39 of series 5).
Unchanged 11 x 8 mm ground-glass attenuation nodule in the superior
segment of the left lower lobe (image 24 of series 5). Areas of mild
subpleural reticulation in the anterior aspect of the right upper
lobe deep to the right breast, similar to prior studies, most
compatible with evolving postradiation changes. No acute
consolidative airspace disease. No pleural effusions.

Upper Abdomen: 2.3 cm calcified gallstone in the gallbladder. No
findings to suggest an acute cholecystitis at this time. Multiple
low-attenuation lesions noted in the left kidney, incompletely
visualized, but the largest of these measures up to 8.8 cm in
diameter; these findings are similar to prior studies, likely cysts.
Atherosclerosis.

Musculoskeletal/Soft Tissues: Postoperative changes of lumpectomy in
the right breast and right axillary lymph node dissection are again
noted. No findings to suggest local recurrence of disease. There are
no aggressive appearing lytic or blastic lesions noted in the
visualized portions of the skeleton.
IMPRESSION: 1. Interval growth of what is now a 7 x 5 mm right lower lobe
pulmonary nodule (previously 3 mm). Although concerning, this is
still rather small and nonspecific, but close attention on future
followup examinations is recommended, as a metastatic lesion is not
excluded.
2. Stable 11 x 8 mm ground-glass attenuation nodule in the superior
segment of the left lower lobe. Continued attention on future
followup studies is recommended to ensure stability.
3. Atherosclerosis, including left main and 3 vessel coronary artery
disease. Assessment for potential risk factor modification, dietary
therapy or pharmacologic therapy may be warranted, if clinically
indicated.
4. Additional incidental findings, as above.

## 2017-06-20 ENCOUNTER — Encounter: Payer: Self-pay | Admitting: Pediatrics

## 2017-06-20 ENCOUNTER — Ambulatory Visit (INDEPENDENT_AMBULATORY_CARE_PROVIDER_SITE_OTHER): Payer: PPO | Admitting: Pediatrics

## 2017-06-20 VITALS — BP 138/77 | HR 59 | Temp 97.8°F | Ht 71.0 in | Wt 271.6 lb

## 2017-06-20 DIAGNOSIS — Z7901 Long term (current) use of anticoagulants: Secondary | ICD-10-CM | POA: Diagnosis not present

## 2017-06-20 DIAGNOSIS — I10 Essential (primary) hypertension: Secondary | ICD-10-CM

## 2017-06-20 DIAGNOSIS — I4891 Unspecified atrial fibrillation: Secondary | ICD-10-CM

## 2017-06-20 LAB — COAGUCHEK XS/INR WAIVED
INR: 1.6 — ABNORMAL HIGH (ref 0.9–1.1)
Prothrombin Time: 19 s

## 2017-06-20 MED ORDER — WARFARIN SODIUM 2.5 MG PO TABS: 2.5000 mg | ORAL_TABLET | Freq: Every day | ORAL | 1 refills | Status: DC

## 2017-06-20 NOTE — Progress Notes (Signed)
  Subjective:   Patient ID: Tammie [name], female    DOB: [birth year], 76 y.o.   MRN: [id number] CC: Follow-up (Hypertension, INR)  HPI: Tammie [name] is a 76 y.o. female presenting for Follow-up (Hypertension, INR)  Started on amlodipine 3 days ago after BP very elevated prior to getting chemo  Has been checking BPs at home 120s-130s/80s-90s Low this morning of 128/68  Head feels "funny" when BP goes high  No CP, no SOB  A fib: INR 1.7 4 days ago No change made to warfarin then, taking 2.5mg  6 days a week, none on Friday No bleeding No change in appetite, diet habits Avoiding greens, broccoli hasnt missed doses  Relevant past medical, surgical, family and social history reviewed. Allergies and medications reviewed and updated. History  Smoking Status  . Former Smoker  . Packs/day: 3.00  . Years: 32.00  . Types: Cigarettes  . Start date: 12/27/1989  . Quit date: 03/08/1990  Smokeless Tobacco  . Former Systems developer  . Quit date: 03/08/1990    Comment: smoked 3ppd from 1959-1991    ROS: Per HPI   Objective:    BP 138/77   Pulse (!) 59   Temp 97.8 F (36.6 C) (Oral)   Ht 5\' 11"  (1.803 m)   Wt 271 lb 9.6 oz (123.2 kg)   BMI 37.88 kg/m   Wt Readings from Last 3 Encounters:  06/20/17 271 lb 9.6 oz (123.2 kg)  06/16/17 274 lb (124.3 kg)  06/03/17 267 lb (121.1 kg)    Gen: NAD, alert, cooperative with exam, NCAT EYES: EOMI, no conjunctival injection, or no icterus CV: NRRR, normal S1/S2, no murmur Resp: CTABL, no wheezes, normal WOB Abd: +BS, soft, NTND. no guarding or organomegaly Ext: No edema, warm Neuro: Alert and oriented  Assessment & Plan:  Tammie [name] was seen today for follow-up multiple med problems  Diagnoses and all orders for this visit:  Atrial fibrillation, unspecified type ([place]) INR 1.6 Take 5mg  today, increase to 2.5mg  6 days a week, take 1.25mg  on Fridays -     CoaguChek XS/INR Waived Follow up with Tammie [name]  Chronic anticoagulation  Essential  hypertension Elevated, improving Has been on amlodipine for 3 dyas Cont to check at home Cont amlodipine 5mg  Recheck at next INR check  Other orders -     warfarin (COUMADIN) 2.5 MG tablet; Take 1 tablet (2.5 mg total) by mouth daily. Except take 1/2 tablet on Fridays   Follow up plan: Return in about 3 months (around 09/20/2017) for follow up. Tammie Found, MD [facility]

## 2017-07-04 ENCOUNTER — Ambulatory Visit (INDEPENDENT_AMBULATORY_CARE_PROVIDER_SITE_OTHER): Payer: PPO | Admitting: Pharmacist

## 2017-07-04 DIAGNOSIS — I4891 Unspecified atrial fibrillation: Secondary | ICD-10-CM | POA: Diagnosis not present

## 2017-07-04 DIAGNOSIS — Z7901 Long term (current) use of anticoagulants: Secondary | ICD-10-CM

## 2017-07-04 LAB — COAGUCHEK XS/INR WAIVED
INR: 1.7 — AB (ref 0.9–1.1)
PROTHROMBIN TIME: 20.7 s

## 2017-07-05 ENCOUNTER — Other Ambulatory Visit: Payer: Self-pay | Admitting: *Deleted

## 2017-07-05 ENCOUNTER — Encounter: Payer: Self-pay | Admitting: *Deleted

## 2017-07-05 NOTE — Patient Outreach (Signed)
HTA THN Screen. Pt is undergoing ch emotherapy for metastatic cancer. Her prognosis with this treatment is 2 years. She recently had penumonia and needed to be hospitalized.Takes medications as ordered for chronic problems: COPD, HTN, CVD. She has a living will and wants her doctors to make decisions if she cannot speak for herself. I asked her if anyone had discussed Palliative Care services to her, which she was not familiar with. I told her about these services and I told her about [facility] services. She was very appreciative of the information. No care management needs at present but she may call in the future. She also says she has LTC insurance. She does not have any family near by. She had one daughter that died 2 years ago. She does reside with a significant other but she does not rely on him for support or personal care.  Introductory letter sent.  Tammie [name]. Tammie Neither, MSN, [facility] Gerontological Nurse Practitioner [facility] [phone]

## 2017-07-06 ENCOUNTER — Ambulatory Visit (HOSPITAL_COMMUNITY)
Admission: RE | Admit: 2017-07-06 | Discharge: 2017-07-06 | Disposition: A | Discharge status: Home / Self Care | Payer: PPO | Source: Ambulatory Visit | Attending: Internal Medicine | Admitting: Internal Medicine

## 2017-07-06 ENCOUNTER — Encounter (HOSPITAL_COMMUNITY): Payer: Self-pay

## 2017-07-06 DIAGNOSIS — Z7901 Long term (current) use of anticoagulants: Secondary | ICD-10-CM | POA: Insufficient documentation

## 2017-07-06 DIAGNOSIS — Z5181 Encounter for therapeutic drug level monitoring: Secondary | ICD-10-CM | POA: Diagnosis not present

## 2017-07-06 DIAGNOSIS — K8020 Calculus of gallbladder without cholecystitis without obstruction: Secondary | ICD-10-CM | POA: Diagnosis not present

## 2017-07-06 DIAGNOSIS — C3431 Malignant neoplasm of lower lobe, right bronchus or lung: Secondary | ICD-10-CM | POA: Diagnosis not present

## 2017-07-06 DIAGNOSIS — I700 Atherosclerosis of aorta: Secondary | ICD-10-CM | POA: Diagnosis not present

## 2017-07-06 DIAGNOSIS — N281 Cyst of kidney, acquired: Secondary | ICD-10-CM | POA: Diagnosis not present

## 2017-07-06 DIAGNOSIS — N200 Calculus of kidney: Secondary | ICD-10-CM | POA: Diagnosis not present

## 2017-07-06 DIAGNOSIS — R918 Other nonspecific abnormal finding of lung field: Secondary | ICD-10-CM | POA: Diagnosis not present

## 2017-07-06 DIAGNOSIS — C801 Malignant (primary) neoplasm, unspecified: Secondary | ICD-10-CM

## 2017-07-06 DIAGNOSIS — K639 Disease of intestine, unspecified: Secondary | ICD-10-CM | POA: Diagnosis not present

## 2017-07-06 DIAGNOSIS — Z5112 Encounter for antineoplastic immunotherapy: Secondary | ICD-10-CM

## 2017-07-06 DIAGNOSIS — C229 Malignant neoplasm of liver, not specified as primary or secondary: Secondary | ICD-10-CM | POA: Diagnosis not present

## 2017-07-07 ENCOUNTER — Other Ambulatory Visit (HOSPITAL_BASED_OUTPATIENT_CLINIC_OR_DEPARTMENT_OTHER): Payer: PPO

## 2017-07-07 ENCOUNTER — Ambulatory Visit (HOSPITAL_BASED_OUTPATIENT_CLINIC_OR_DEPARTMENT_OTHER): Payer: PPO

## 2017-07-07 ENCOUNTER — Telehealth: Payer: Self-pay | Admitting: Internal Medicine

## 2017-07-07 ENCOUNTER — Encounter: Payer: Self-pay | Admitting: Internal Medicine

## 2017-07-07 ENCOUNTER — Ambulatory Visit (HOSPITAL_BASED_OUTPATIENT_CLINIC_OR_DEPARTMENT_OTHER): Payer: PPO | Admitting: Internal Medicine

## 2017-07-07 VITALS — BP 87/77 | HR 64 | Temp 99.0°F | Resp 20 | Ht 71.0 in | Wt 271.3 lb

## 2017-07-07 VITALS — BP 149/67 | HR 71

## 2017-07-07 DIAGNOSIS — C3431 Malignant neoplasm of lower lobe, right bronchus or lung: Secondary | ICD-10-CM

## 2017-07-07 DIAGNOSIS — C50411 Malignant neoplasm of upper-outer quadrant of right female breast: Secondary | ICD-10-CM

## 2017-07-07 DIAGNOSIS — Z853 Personal history of malignant neoplasm of breast: Secondary | ICD-10-CM | POA: Diagnosis not present

## 2017-07-07 DIAGNOSIS — C229 Malignant neoplasm of liver, not specified as primary or secondary: Secondary | ICD-10-CM

## 2017-07-07 DIAGNOSIS — C787 Secondary malignant neoplasm of liver and intrahepatic bile duct: Secondary | ICD-10-CM | POA: Diagnosis not present

## 2017-07-07 DIAGNOSIS — C182 Malignant neoplasm of ascending colon: Secondary | ICD-10-CM

## 2017-07-07 DIAGNOSIS — I10 Essential (primary) hypertension: Secondary | ICD-10-CM | POA: Diagnosis not present

## 2017-07-07 DIAGNOSIS — Z85118 Personal history of other malignant neoplasm of bronchus and lung: Secondary | ICD-10-CM

## 2017-07-07 DIAGNOSIS — C189 Malignant neoplasm of colon, unspecified: Secondary | ICD-10-CM | POA: Insufficient documentation

## 2017-07-07 DIAGNOSIS — Z5112 Encounter for antineoplastic immunotherapy: Secondary | ICD-10-CM | POA: Diagnosis not present

## 2017-07-07 LAB — COMPREHENSIVE METABOLIC PANEL
ALT: 13 U/L (ref 0–55)
ANION GAP: 8 meq/L (ref 3–11)
AST: 16 U/L (ref 5–34)
Albumin: 3.5 g/dL (ref 3.5–5.0)
Alkaline Phosphatase: 69 U/L (ref 40–150)
BILIRUBIN TOTAL: 0.52 mg/dL (ref 0.20–1.20)
BUN: 25.6 mg/dL (ref 7.0–26.0)
CALCIUM: 9.7 mg/dL (ref 8.4–10.4)
CHLORIDE: 109 meq/L (ref 98–109)
CO2: 26 mEq/L (ref 22–29)
CREATININE: 1.5 mg/dL — AB (ref 0.6–1.1)
EGFR: 33 mL/min/{1.73_m2} — AB (ref >90)
Glucose: 92 mg/dl (ref 70–140)
Potassium: 4.6 mEq/L (ref 3.5–5.1)
Sodium: 142 mEq/L (ref 136–145)
Total Protein: 6.8 g/dL (ref 6.4–8.3)

## 2017-07-07 LAB — CBC WITH DIFFERENTIAL/PLATELET
BASO%: 0.9 % (ref 0.0–2.0)
Basophils Absolute: 0.1 10*3/uL (ref 0.0–0.1)
EOS ABS: 0.2 10*3/uL (ref 0.0–0.5)
EOS%: 3.4 % (ref 0.0–7.0)
HEMATOCRIT: 41.2 % (ref 34.8–46.6)
HGB: 13.1 g/dL (ref 11.6–15.9)
LYMPH#: 1.1 10*3/uL (ref 0.9–3.3)
LYMPH%: 19.1 % (ref 14.0–49.7)
MCH: 27.5 pg (ref 25.1–34.0)
MCHC: 31.8 g/dL (ref 31.5–36.0)
MCV: 86.6 fL (ref 79.5–101.0)
MONO#: 0.4 10*3/uL (ref 0.1–0.9)
MONO%: 7.8 % (ref 0.0–14.0)
NEUT%: 68.8 % (ref 38.4–76.8)
NEUTROS ABS: 3.9 10*3/uL (ref 1.5–6.5)
Platelets: 172 10*3/uL (ref 145–400)
RBC: 4.76 10*6/uL (ref 3.70–5.45)
RDW: 15.2 % — ABNORMAL HIGH (ref 11.2–14.5)
WBC: 5.6 10*3/uL (ref 3.9–10.3)

## 2017-07-07 MED ORDER — SODIUM CHLORIDE 0.9 % IV SOLN
Freq: Once | INTRAVENOUS | Status: AC
  Administered 2017-07-07: 14:00:00 via INTRAVENOUS

## 2017-07-07 MED ORDER — SODIUM CHLORIDE 0.9% FLUSH
10.0000 mL | INTRAVENOUS | Status: DC | PRN
  Administered 2017-07-07: 10 mL
  Filled 2017-07-07: qty 10

## 2017-07-07 MED ORDER — SODIUM CHLORIDE 0.9 % IV SOLN
200.0000 mg | Freq: Once | INTRAVENOUS | Status: AC
  Administered 2017-07-07: 200 mg via INTRAVENOUS
  Filled 2017-07-07: qty 8

## 2017-07-07 MED ORDER — HEPARIN SOD (PORK) LOCK FLUSH 100 UNIT/ML IV SOLN
500.0000 [IU] | Freq: Once | INTRAVENOUS | Status: AC | PRN
  Administered 2017-07-07: 500 [IU]
  Filled 2017-07-07: qty 5

## 2017-07-07 NOTE — Telephone Encounter (Signed)
G pt appts for 7/18 + already scheduled infusions.
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
## 2017-07-07 NOTE — Patient Instructions (Signed)
[facility] Discharge Instructions for Patients Receiving Chemotherapy  Today you received the following chemotherapy agents Keytruda  To help prevent nausea and vomiting after your treatment, we encourage you to take your nausea medication    If you develop nausea and vomiting that is not controlled by your nausea medication, call the clinic.   BELOW ARE SYMPTOMS THAT SHOULD BE REPORTED IMMEDIATELY:  *FEVER GREATER THAN 100.5 F  *CHILLS WITH OR WITHOUT FEVER  NAUSEA AND VOMITING THAT IS NOT CONTROLLED WITH YOUR NAUSEA MEDICATION  *UNUSUAL SHORTNESS OF BREATH  *UNUSUAL BRUISING OR BLEEDING  TENDERNESS IN MOUTH AND THROAT WITH OR WITHOUT PRESENCE OF ULCERS  *URINARY PROBLEMS  *BOWEL PROBLEMS  UNUSUAL RASH Items with * indicate a potential emergency and should be followed up as soon as possible.  Feel free to call the clinic you have any questions or concerns. The clinic phone number is [phone].  Please show the [place] at check-in to the Emergency Department and triage nurse.

## 2017-07-08 DIAGNOSIS — N183 Chronic kidney disease, stage 3 (moderate): Secondary | ICD-10-CM | POA: Diagnosis not present

## 2017-07-08 DIAGNOSIS — I10 Essential (primary) hypertension: Secondary | ICD-10-CM | POA: Diagnosis not present

## 2017-07-11 ENCOUNTER — Encounter: Payer: Self-pay | Admitting: *Deleted

## 2017-07-13 ENCOUNTER — Ambulatory Visit (HOSPITAL_BASED_OUTPATIENT_CLINIC_OR_DEPARTMENT_OTHER): Payer: PPO | Admitting: Genetics

## 2017-07-13 ENCOUNTER — Other Ambulatory Visit: Payer: PPO

## 2017-07-13 ENCOUNTER — Encounter: Payer: Self-pay | Admitting: Genetics

## 2017-07-13 DIAGNOSIS — Z315 Encounter for genetic counseling: Secondary | ICD-10-CM | POA: Diagnosis not present

## 2017-07-13 DIAGNOSIS — C189 Malignant neoplasm of colon, unspecified: Secondary | ICD-10-CM

## 2017-07-13 DIAGNOSIS — C787 Secondary malignant neoplasm of liver and intrahepatic bile duct: Secondary | ICD-10-CM | POA: Diagnosis not present

## 2017-07-13 DIAGNOSIS — Z85118 Personal history of other malignant neoplasm of bronchus and lung: Secondary | ICD-10-CM

## 2017-07-13 DIAGNOSIS — C182 Malignant neoplasm of ascending colon: Secondary | ICD-10-CM

## 2017-07-13 DIAGNOSIS — Z1379 Encounter for other screening for genetic and chromosomal anomalies: Secondary | ICD-10-CM | POA: Insufficient documentation

## 2017-07-13 DIAGNOSIS — Z853 Personal history of malignant neoplasm of breast: Secondary | ICD-10-CM
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
## 2017-07-25 ENCOUNTER — Telehealth: Payer: Self-pay | Admitting: Medical Oncology

## 2017-07-25 NOTE — Telephone Encounter (Signed)
Returned pt call. She reports her oxygen machine broke -a friend gave it to her. She has done fine without it and had it only for nighttime use. She wants to know if she should continue oxygen? I told her we will test her this week at her appointment

## 2017-07-27 ENCOUNTER — Other Ambulatory Visit: Payer: Self-pay | Admitting: Medical Oncology

## 2017-07-27 DIAGNOSIS — C50411 Malignant neoplasm of upper-outer quadrant of right female breast: Secondary | ICD-10-CM

## 2017-07-28 ENCOUNTER — Ambulatory Visit (HOSPITAL_BASED_OUTPATIENT_CLINIC_OR_DEPARTMENT_OTHER): Payer: PPO | Admitting: Internal Medicine

## 2017-07-28 ENCOUNTER — Encounter: Payer: Self-pay | Admitting: Internal Medicine

## 2017-07-28 ENCOUNTER — Ambulatory Visit (HOSPITAL_BASED_OUTPATIENT_CLINIC_OR_DEPARTMENT_OTHER): Payer: PPO

## 2017-07-28 ENCOUNTER — Other Ambulatory Visit (HOSPITAL_BASED_OUTPATIENT_CLINIC_OR_DEPARTMENT_OTHER): Payer: PPO

## 2017-07-28 ENCOUNTER — Other Ambulatory Visit: Payer: Self-pay | Admitting: Medical Oncology

## 2017-07-28 VITALS — BP 142/70 | HR 54 | Temp 98.1°F | Resp 19 | Ht 71.0 in | Wt 272.4 lb

## 2017-07-28 DIAGNOSIS — R109 Unspecified abdominal pain: Secondary | ICD-10-CM

## 2017-07-28 DIAGNOSIS — C182 Malignant neoplasm of ascending colon: Secondary | ICD-10-CM

## 2017-07-28 DIAGNOSIS — C787 Secondary malignant neoplasm of liver and intrahepatic bile duct: Secondary | ICD-10-CM

## 2017-07-28 DIAGNOSIS — Z5112 Encounter for antineoplastic immunotherapy: Secondary | ICD-10-CM | POA: Diagnosis not present

## 2017-07-28 DIAGNOSIS — C3431 Malignant neoplasm of lower lobe, right bronchus or lung: Secondary | ICD-10-CM

## 2017-07-28 DIAGNOSIS — C189 Malignant neoplasm of colon, unspecified: Secondary | ICD-10-CM

## 2017-07-28 DIAGNOSIS — C50411 Malignant neoplasm of upper-outer quadrant of right female breast: Secondary | ICD-10-CM

## 2017-07-28 DIAGNOSIS — I4891 Unspecified atrial fibrillation: Secondary | ICD-10-CM

## 2017-07-28 DIAGNOSIS — Z853 Personal history of malignant neoplasm of breast: Secondary | ICD-10-CM

## 2017-07-28 DIAGNOSIS — I10 Essential (primary) hypertension: Secondary | ICD-10-CM | POA: Diagnosis not present

## 2017-07-28 DIAGNOSIS — C229 Malignant neoplasm of liver, not specified as primary or secondary: Secondary | ICD-10-CM

## 2017-07-28 DIAGNOSIS — Z85118 Personal history of other malignant neoplasm of bronchus and lung: Secondary | ICD-10-CM | POA: Diagnosis not present

## 2017-07-28 LAB — PROTIME-INR
INR: 2.5 (ref 2.00–3.50)
PROTIME: 30 s — AB (ref 10.6–13.4)

## 2017-07-28 LAB — CBC WITH DIFFERENTIAL/PLATELET
BASO%: 0.7 % (ref 0.0–2.0)
Basophils Absolute: 0 10*3/uL (ref 0.0–0.1)
EOS ABS: 0.2 10*3/uL (ref 0.0–0.5)
EOS%: 4 % (ref 0.0–7.0)
HCT: 42 % (ref 34.8–46.6)
HEMOGLOBIN: 13.3 g/dL (ref 11.6–15.9)
LYMPH%: 19.1 % (ref 14.0–49.7)
MCH: 27.8 pg (ref 25.1–34.0)
MCHC: 31.7 g/dL (ref 31.5–36.0)
MCV: 87.9 fL (ref 79.5–101.0)
MONO#: 0.4 10*3/uL (ref 0.1–0.9)
MONO%: 7.5 % (ref 0.0–14.0)
NEUT%: 68.7 % (ref 38.4–76.8)
NEUTROS ABS: 4 10*3/uL (ref 1.5–6.5)
PLATELETS: 155 10*3/uL (ref 145–400)
RBC: 4.78 10*6/uL (ref 3.70–5.45)
RDW: 14.9 % — AB (ref 11.2–14.5)
WBC: 5.8 10*3/uL (ref 3.9–10.3)
lymph#: 1.1 10*3/uL (ref 0.9–3.3)

## 2017-07-28 LAB — COMPREHENSIVE METABOLIC PANEL
ALBUMIN: 3.6 g/dL (ref 3.5–5.0)
ALK PHOS: 56 U/L (ref 40–150)
ALT: 13 U/L (ref 0–55)
AST: 19 U/L (ref 5–34)
Anion Gap: 8 mEq/L (ref 3–11)
BILIRUBIN TOTAL: 0.61 mg/dL (ref 0.20–1.20)
BUN: 24.8 mg/dL (ref 7.0–26.0)
CALCIUM: 9.1 mg/dL (ref 8.4–10.4)
CO2: 29 mEq/L (ref 22–29)
Chloride: 108 mEq/L (ref 98–109)
Creatinine: 1.7 mg/dL — ABNORMAL HIGH (ref 0.6–1.1)
EGFR: 30 mL/min/{1.73_m2} — AB (ref >90)
Glucose: 116 mg/dl (ref 70–140)
POTASSIUM: 4.8 meq/L (ref 3.5–5.1)
SODIUM: 145 meq/L (ref 136–145)
TOTAL PROTEIN: 6.7 g/dL (ref 6.4–8.3)

## 2017-07-28 MED ORDER — PEMBROLIZUMAB CHEMO INJECTION 100 MG/4ML
200.0000 mg | Freq: Once | INTRAVENOUS | Status: AC
  Administered 2017-07-28: 200 mg via INTRAVENOUS
  Filled 2017-07-28: qty 8

## 2017-07-28 MED ORDER — SODIUM CHLORIDE 0.9 % IV SOLN: Freq: Once | INTRAVENOUS | Status: DC

## 2017-07-28 MED ORDER — SODIUM CHLORIDE 0.9% FLUSH
10.0000 mL | INTRAVENOUS | Status: DC | PRN
  Filled 2017-07-28: qty 10

## 2017-07-28 MED ORDER — HEPARIN SOD (PORK) LOCK FLUSH 100 UNIT/ML IV SOLN
500.0000 [IU] | Freq: Once | INTRAVENOUS | Status: DC | PRN
  Filled 2017-07-28: qty 5
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
## 2017-07-28 NOTE — Progress Notes (Signed)
SATURATION QUALIFICATIONS: (This note is used to comply with regulatory documentation for home oxygen)  Patient Saturations on Room Air at Rest = 97%  Patient Saturations on Room Air while Ambulating =91%  Patient Saturations on  Liters of oxygen while Ambulating =%  Please briefly explain why patient needs home oxygen:

## 2017-07-29 ENCOUNTER — Telehealth: Payer: Self-pay | Admitting: Internal Medicine

## 2017-07-29 NOTE — Telephone Encounter (Signed)
Spoke with patient about her upcoming appointments in Aug, Sept and Oct.

## 2017-08-05 ENCOUNTER — Ambulatory Visit (INDEPENDENT_AMBULATORY_CARE_PROVIDER_SITE_OTHER): Payer: PPO | Admitting: Pharmacist

## 2017-08-05 DIAGNOSIS — I4891 Unspecified atrial fibrillation: Secondary | ICD-10-CM

## 2017-08-17 ENCOUNTER — Other Ambulatory Visit: Payer: Self-pay | Admitting: Medical Oncology

## 2017-08-17 DIAGNOSIS — C229 Malignant neoplasm of liver, not specified as primary or secondary: Secondary | ICD-10-CM

## 2017-08-18 ENCOUNTER — Ambulatory Visit (HOSPITAL_BASED_OUTPATIENT_CLINIC_OR_DEPARTMENT_OTHER): Payer: PPO | Admitting: Internal Medicine

## 2017-08-18 ENCOUNTER — Other Ambulatory Visit: Payer: Self-pay | Admitting: Medical Oncology

## 2017-08-18 ENCOUNTER — Ambulatory Visit: Payer: PPO

## 2017-08-18 ENCOUNTER — Telehealth: Payer: Self-pay | Admitting: Internal Medicine

## 2017-08-18 ENCOUNTER — Ambulatory Visit (HOSPITAL_BASED_OUTPATIENT_CLINIC_OR_DEPARTMENT_OTHER): Payer: PPO

## 2017-08-18 ENCOUNTER — Encounter: Payer: Self-pay | Admitting: Internal Medicine

## 2017-08-18 ENCOUNTER — Other Ambulatory Visit (HOSPITAL_BASED_OUTPATIENT_CLINIC_OR_DEPARTMENT_OTHER): Payer: PPO

## 2017-08-18 VITALS — BP 117/75 | HR 66 | Temp 98.2°F | Resp 26 | Ht 71.0 in | Wt 276.2 lb

## 2017-08-18 DIAGNOSIS — C787 Secondary malignant neoplasm of liver and intrahepatic bile duct: Secondary | ICD-10-CM

## 2017-08-18 DIAGNOSIS — C189 Malignant neoplasm of colon, unspecified: Secondary | ICD-10-CM

## 2017-08-18 DIAGNOSIS — C229 Malignant neoplasm of liver, not specified as primary or secondary: Secondary | ICD-10-CM

## 2017-08-18 DIAGNOSIS — Z853 Personal history of malignant neoplasm of breast: Secondary | ICD-10-CM | POA: Diagnosis not present

## 2017-08-18 DIAGNOSIS — C3431 Malignant neoplasm of lower lobe, right bronchus or lung: Secondary | ICD-10-CM

## 2017-08-18 DIAGNOSIS — C182 Malignant neoplasm of ascending colon: Secondary | ICD-10-CM | POA: Diagnosis not present

## 2017-08-18 DIAGNOSIS — Z5112 Encounter for antineoplastic immunotherapy: Secondary | ICD-10-CM

## 2017-08-18 DIAGNOSIS — I4891 Unspecified atrial fibrillation: Secondary | ICD-10-CM | POA: Diagnosis not present

## 2017-08-18 DIAGNOSIS — Z85118 Personal history of other malignant neoplasm of bronchus and lung: Secondary | ICD-10-CM

## 2017-08-18 LAB — CBC WITH DIFFERENTIAL/PLATELET
BASO%: 1.9 % (ref 0.0–2.0)
BASOS ABS: 0.1 10*3/uL (ref 0.0–0.1)
EOS%: 3.8 % (ref 0.0–7.0)
Eosinophils Absolute: 0.2 10*3/uL (ref 0.0–0.5)
HCT: 42.6 % (ref 34.8–46.6)
HEMOGLOBIN: 13.8 g/dL (ref 11.6–15.9)
LYMPH%: 20.2 % (ref 14.0–49.7)
MCH: 27.7 pg (ref 25.1–34.0)
MCHC: 32.5 g/dL (ref 31.5–36.0)
MCV: 85.3 fL (ref 79.5–101.0)
MONO#: 0.4 10*3/uL (ref 0.1–0.9)
MONO%: 6.4 % (ref 0.0–14.0)
NEUT%: 67.7 % (ref 38.4–76.8)
NEUTROS ABS: 3.9 10*3/uL (ref 1.5–6.5)
Platelets: 158 10*3/uL (ref 145–400)
RBC: 5 10*6/uL (ref 3.70–5.45)
RDW: 14.9 % — AB (ref 11.2–14.5)
WBC: 5.7 10*3/uL (ref 3.9–10.3)
lymph#: 1.2 10*3/uL (ref 0.9–3.3)

## 2017-08-18 LAB — COMPREHENSIVE METABOLIC PANEL
ALBUMIN: 3.5 g/dL (ref 3.5–5.0)
ALK PHOS: 52 U/L (ref 40–150)
ALT: 12 U/L (ref 0–55)
AST: 17 U/L (ref 5–34)
Anion Gap: 7 mEq/L (ref 3–11)
BUN: 24 mg/dL (ref 7.0–26.0)
CO2: 29 mEq/L (ref 22–29)
Calcium: 9.5 mg/dL (ref 8.4–10.4)
Chloride: 109 mEq/L (ref 98–109)
Creatinine: 1.7 mg/dL — ABNORMAL HIGH (ref 0.6–1.1)
EGFR: 30 mL/min/{1.73_m2} — ABNORMAL LOW (ref >90)
GLUCOSE: 117 mg/dL (ref 70–140)
POTASSIUM: 4.7 meq/L (ref 3.5–5.1)
SODIUM: 146 meq/L — AB (ref 136–145)
Total Bilirubin: 0.61 mg/dL (ref 0.20–1.20)
Total Protein: 6.9 g/dL (ref 6.4–8.3)

## 2017-08-18 LAB — PROTIME-INR
INR: 2.2 (ref 2.00–3.50)
PROTIME: 26.4 s — AB (ref 10.6–13.4)

## 2017-08-18 MED ORDER — SODIUM CHLORIDE 0.9 % IV SOLN
Freq: Once | INTRAVENOUS | Status: AC
  Administered 2017-08-18: 13:00:00 via INTRAVENOUS

## 2017-08-18 MED ORDER — SODIUM CHLORIDE 0.9 % IV SOLN
200.0000 mg | Freq: Once | INTRAVENOUS | Status: AC
  Administered 2017-08-18: 200 mg via INTRAVENOUS
  Filled 2017-08-18: qty 8

## 2017-08-18 MED ORDER — SODIUM CHLORIDE 0.9% FLUSH
10.0000 mL | INTRAVENOUS | Status: DC | PRN
  Administered 2017-08-18: 10 mL
  Filled 2017-08-18: qty 10

## 2017-08-18 MED ORDER — HEPARIN SOD (PORK) LOCK FLUSH 100 UNIT/ML IV SOLN
500.0000 [IU] | Freq: Once | INTRAVENOUS | Status: AC | PRN
  Administered 2017-08-18: 500 [IU]
  Filled 2017-08-18: qty 5
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
## 2017-08-18 NOTE — Progress Notes (Signed)
Ok to treat with Creatinine level today per MD [facility]

## 2017-08-18 NOTE — Patient Instructions (Signed)
[facility] Discharge Instructions for Patients Receiving Chemotherapy  Today you received the following chemotherapy agents Keytruda  To help prevent nausea and vomiting after your treatment, we encourage you to take your nausea medication    If you develop nausea and vomiting that is not controlled by your nausea medication, call the clinic.   BELOW ARE SYMPTOMS THAT SHOULD BE REPORTED IMMEDIATELY:  *FEVER GREATER THAN 100.5 F  *CHILLS WITH OR WITHOUT FEVER  NAUSEA AND VOMITING THAT IS NOT CONTROLLED WITH YOUR NAUSEA MEDICATION  *UNUSUAL SHORTNESS OF BREATH  *UNUSUAL BRUISING OR BLEEDING  TENDERNESS IN MOUTH AND THROAT WITH OR WITHOUT PRESENCE OF ULCERS  *URINARY PROBLEMS  *BOWEL PROBLEMS  UNUSUAL RASH Items with * indicate a potential emergency and should be followed up as soon as possible.  Feel free to call the clinic you have any questions or concerns. The clinic phone number is [phone].  Please show the [place] at check-in to the Emergency Department and triage nurse.

## 2017-08-18 NOTE — Telephone Encounter (Signed)
Gave patient AVS and calendar of upcoming September appointment.

## 2017-09-06 ENCOUNTER — Ambulatory Visit (HOSPITAL_COMMUNITY)
Admission: RE | Admit: 2017-09-06 | Discharge: 2017-09-06 | Disposition: A | Discharge status: Home / Self Care | Payer: PPO | Source: Ambulatory Visit | Attending: Internal Medicine | Admitting: Internal Medicine

## 2017-09-06 DIAGNOSIS — C189 Malignant neoplasm of colon, unspecified: Secondary | ICD-10-CM | POA: Insufficient documentation

## 2017-09-06 DIAGNOSIS — R918 Other nonspecific abnormal finding of lung field: Secondary | ICD-10-CM | POA: Insufficient documentation

## 2017-09-06 DIAGNOSIS — I700 Atherosclerosis of aorta: Secondary | ICD-10-CM | POA: Diagnosis not present

## 2017-09-06 DIAGNOSIS — N200 Calculus of kidney: Secondary | ICD-10-CM | POA: Diagnosis not present

## 2017-09-06 DIAGNOSIS — Z5112 Encounter for antineoplastic immunotherapy: Secondary | ICD-10-CM | POA: Insufficient documentation

## 2017-09-06 DIAGNOSIS — C3431 Malignant neoplasm of lower lobe, right bronchus or lung: Secondary | ICD-10-CM | POA: Diagnosis not present

## 2017-09-06 DIAGNOSIS — R591 Generalized enlarged lymph nodes: Secondary | ICD-10-CM | POA: Insufficient documentation

## 2017-09-06 DIAGNOSIS — K8020 Calculus of gallbladder without cholecystitis without obstruction: Secondary | ICD-10-CM | POA: Insufficient documentation

## 2017-09-06 DIAGNOSIS — C787 Secondary malignant neoplasm of liver and intrahepatic bile duct: Secondary | ICD-10-CM | POA: Insufficient documentation

## 2017-09-06 DIAGNOSIS — I2510 Atherosclerotic heart disease of native coronary artery without angina pectoris: Secondary | ICD-10-CM | POA: Diagnosis not present

## 2017-09-06 DIAGNOSIS — C229 Malignant neoplasm of liver, not specified as primary or secondary: Secondary | ICD-10-CM

## 2017-09-08 ENCOUNTER — Other Ambulatory Visit: Payer: Self-pay | Admitting: Internal Medicine

## 2017-09-08 ENCOUNTER — Other Ambulatory Visit: Payer: Self-pay | Admitting: *Deleted

## 2017-09-08 ENCOUNTER — Ambulatory Visit (HOSPITAL_BASED_OUTPATIENT_CLINIC_OR_DEPARTMENT_OTHER): Payer: PPO

## 2017-09-08 ENCOUNTER — Other Ambulatory Visit (HOSPITAL_BASED_OUTPATIENT_CLINIC_OR_DEPARTMENT_OTHER): Payer: PPO

## 2017-09-08 ENCOUNTER — Encounter: Payer: Self-pay | Admitting: Internal Medicine

## 2017-09-08 ENCOUNTER — Ambulatory Visit (HOSPITAL_BASED_OUTPATIENT_CLINIC_OR_DEPARTMENT_OTHER): Payer: PPO | Admitting: Internal Medicine

## 2017-09-08 VITALS — BP 153/65 | HR 72 | Temp 98.2°F | Resp 18

## 2017-09-08 DIAGNOSIS — C182 Malignant neoplasm of ascending colon: Secondary | ICD-10-CM

## 2017-09-08 DIAGNOSIS — Z85118 Personal history of other malignant neoplasm of bronchus and lung: Secondary | ICD-10-CM

## 2017-09-08 DIAGNOSIS — Z7901 Long term (current) use of anticoagulants: Secondary | ICD-10-CM

## 2017-09-08 DIAGNOSIS — I4891 Unspecified atrial fibrillation: Secondary | ICD-10-CM

## 2017-09-08 DIAGNOSIS — Z5112 Encounter for antineoplastic immunotherapy: Secondary | ICD-10-CM

## 2017-09-08 DIAGNOSIS — R0989 Other specified symptoms and signs involving the circulatory and respiratory systems: Secondary | ICD-10-CM | POA: Diagnosis not present

## 2017-09-08 DIAGNOSIS — C229 Malignant neoplasm of liver, not specified as primary or secondary: Secondary | ICD-10-CM

## 2017-09-08 DIAGNOSIS — C189 Malignant neoplasm of colon, unspecified: Secondary | ICD-10-CM

## 2017-09-08 DIAGNOSIS — C3431 Malignant neoplasm of lower lobe, right bronchus or lung: Secondary | ICD-10-CM

## 2017-09-08 DIAGNOSIS — C787 Secondary malignant neoplasm of liver and intrahepatic bile duct: Secondary | ICD-10-CM

## 2017-09-08 DIAGNOSIS — C50411 Malignant neoplasm of upper-outer quadrant of right female breast: Secondary | ICD-10-CM

## 2017-09-08 DIAGNOSIS — Z853 Personal history of malignant neoplasm of breast: Secondary | ICD-10-CM | POA: Diagnosis not present

## 2017-09-08 DIAGNOSIS — Z79899 Other long term (current) drug therapy: Secondary | ICD-10-CM | POA: Diagnosis not present

## 2017-09-08 LAB — CBC WITH DIFFERENTIAL/PLATELET
BASO%: 1.2 % (ref 0.0–2.0)
BASOS ABS: 0.1 10*3/uL (ref 0.0–0.1)
EOS ABS: 0.3 10*3/uL (ref 0.0–0.5)
EOS%: 4 % (ref 0.0–7.0)
HCT: 42 % (ref 34.8–46.6)
HEMOGLOBIN: 13.9 g/dL (ref 11.6–15.9)
LYMPH%: 12.7 % — AB (ref 14.0–49.7)
MCH: 28.1 pg (ref 25.1–34.0)
MCHC: 33 g/dL (ref 31.5–36.0)
MCV: 85.3 fL (ref 79.5–101.0)
MONO#: 0.4 10*3/uL (ref 0.1–0.9)
MONO%: 5.9 % (ref 0.0–14.0)
NEUT#: 5.1 10*3/uL (ref 1.5–6.5)
NEUT%: 76.2 % (ref 38.4–76.8)
PLATELETS: 178 10*3/uL (ref 145–400)
RBC: 4.93 10*6/uL (ref 3.70–5.45)
RDW: 14.8 % — ABNORMAL HIGH (ref 11.2–14.5)
WBC: 6.7 10*3/uL (ref 3.9–10.3)
lymph#: 0.8 10*3/uL — ABNORMAL LOW (ref 0.9–3.3)

## 2017-09-08 LAB — COMPREHENSIVE METABOLIC PANEL
ALBUMIN: 3.4 g/dL — AB (ref 3.5–5.0)
ALK PHOS: 75 U/L (ref 40–150)
ALT: 11 U/L (ref 0–55)
AST: 14 U/L (ref 5–34)
Anion Gap: 8 mEq/L (ref 3–11)
BILIRUBIN TOTAL: 0.42 mg/dL (ref 0.20–1.20)
BUN: 25.7 mg/dL (ref 7.0–26.0)
CO2: 30 meq/L — AB (ref 22–29)
CREATININE: 1.7 mg/dL — AB (ref 0.6–1.1)
Calcium: 9.5 mg/dL (ref 8.4–10.4)
Chloride: 105 mEq/L (ref 98–109)
EGFR: 29 mL/min/{1.73_m2} — AB (ref >90)
GLUCOSE: 127 mg/dL (ref 70–140)
Potassium: 4.2 mEq/L (ref 3.5–5.1)
SODIUM: 143 meq/L (ref 136–145)
TOTAL PROTEIN: 7 g/dL (ref 6.4–8.3)

## 2017-09-08 LAB — CEA (IN HOUSE-CHCC)
CEA (CHCC-In House): 1.38 ng/mL (ref 0.00–5.00)
CEA (CHCC-In House): 1.52 ng/mL (ref 0.00–5.00)

## 2017-09-08 LAB — PROTIME-INR
INR: 2.2 (ref 2.00–3.50)
PROTIME: 26.4 s — AB (ref 10.6–13.4)

## 2017-09-08 LAB — TSH: TSH: 3.085 m(IU)/L (ref 0.308–3.960)

## 2017-09-08 MED ORDER — SODIUM CHLORIDE 0.9 % IV SOLN
Freq: Once | INTRAVENOUS | Status: AC
  Administered 2017-09-08: 11:00:00 via INTRAVENOUS

## 2017-09-08 MED ORDER — SODIUM CHLORIDE 0.9 % IV SOLN
200.0000 mg | Freq: Once | INTRAVENOUS | Status: AC
  Administered 2017-09-08: 200 mg via INTRAVENOUS
  Filled 2017-09-08: qty 8

## 2017-09-08 MED ORDER — SODIUM CHLORIDE 0.9% FLUSH
10.0000 mL | INTRAVENOUS | Status: DC | PRN
  Administered 2017-09-08: 10 mL
  Filled 2017-09-08: qty 10

## 2017-09-08 MED ORDER — HEPARIN SOD (PORK) LOCK FLUSH 100 UNIT/ML IV SOLN
500.0000 [IU] | Freq: Once | INTRAVENOUS | Status: AC | PRN
  Administered 2017-09-08: 500 [IU]
  Filled 2017-09-08: qty 5

## 2017-09-08 NOTE — Patient Instructions (Signed)
[facility] Discharge Instructions for Patients Receiving Chemotherapy  Today you received the following chemotherapy agents: Keytruda   To help prevent nausea and vomiting after your treatment, we encourage you to take your nausea medication as directed    If you develop nausea and vomiting that is not controlled by your nausea medication, call the clinic.   BELOW ARE SYMPTOMS THAT SHOULD BE REPORTED IMMEDIATELY:  *FEVER GREATER THAN 100.5 F  *CHILLS WITH OR WITHOUT FEVER  NAUSEA AND VOMITING THAT IS NOT CONTROLLED WITH YOUR NAUSEA MEDICATION  *UNUSUAL SHORTNESS OF BREATH  *UNUSUAL BRUISING OR BLEEDING  TENDERNESS IN MOUTH AND THROAT WITH OR WITHOUT PRESENCE OF ULCERS  *URINARY PROBLEMS  *BOWEL PROBLEMS  UNUSUAL RASH Items with * indicate a potential emergency and should be followed up as soon as possible.  Feel free to call the clinic you have any questions or concerns. The clinic phone number is [phone].  Please show the CHEMO ALERT CARD at check-in to the Emergency Department and triage nurse.   
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
## 2017-09-08 NOTE — Progress Notes (Signed)
Per [name] okay to treat with Creatinine 1.7.

## 2017-09-09 ENCOUNTER — Telehealth: Payer: Self-pay | Admitting: Internal Medicine

## 2017-09-09 NOTE — Telephone Encounter (Signed)
No additional appts scheduled per 9/13 los - 3 cycles already scheduled.

## 2017-09-15 ENCOUNTER — Other Ambulatory Visit: Payer: Self-pay | Admitting: Pediatrics

## 2017-09-15 DIAGNOSIS — I10 Essential (primary) hypertension: Secondary | ICD-10-CM

## 2017-09-15 NOTE — Telephone Encounter (Signed)
I do not see a recent lipid panel please review

## 2017-09-17 IMAGING — MG MM DIAG BREAST TOMO BILATERAL
8 of 13 series · 8 of 29 positions shown · non-contrast
Comparison: Previous exam(s).

CLINICAL DATA: History of right breast cancer in 2392 status post
lumpectomy and radiation therapy.

EXAM:
DIGITAL DIAGNOSTIC BILATERAL MAMMOGRAM WITH 3D TOMOSYNTHESIS AND CAD

[R CC]
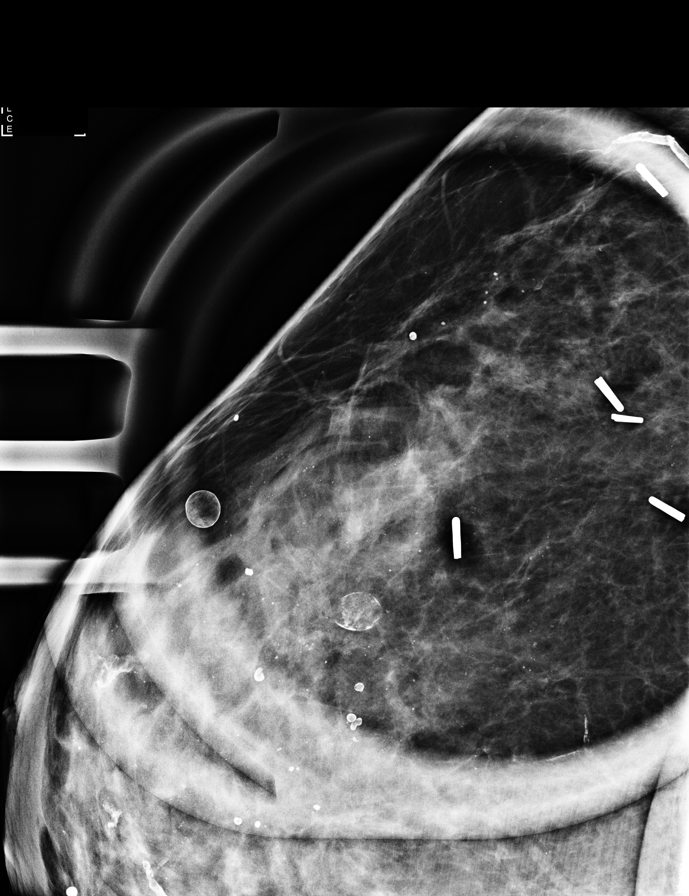

[R MLO synth-2D]
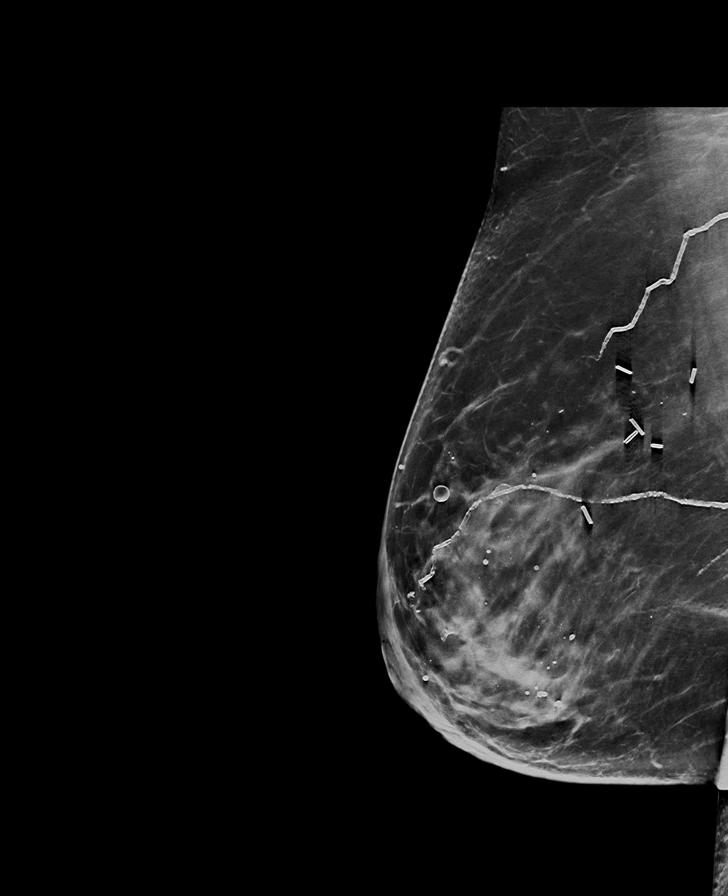

[L CC synth-2D]
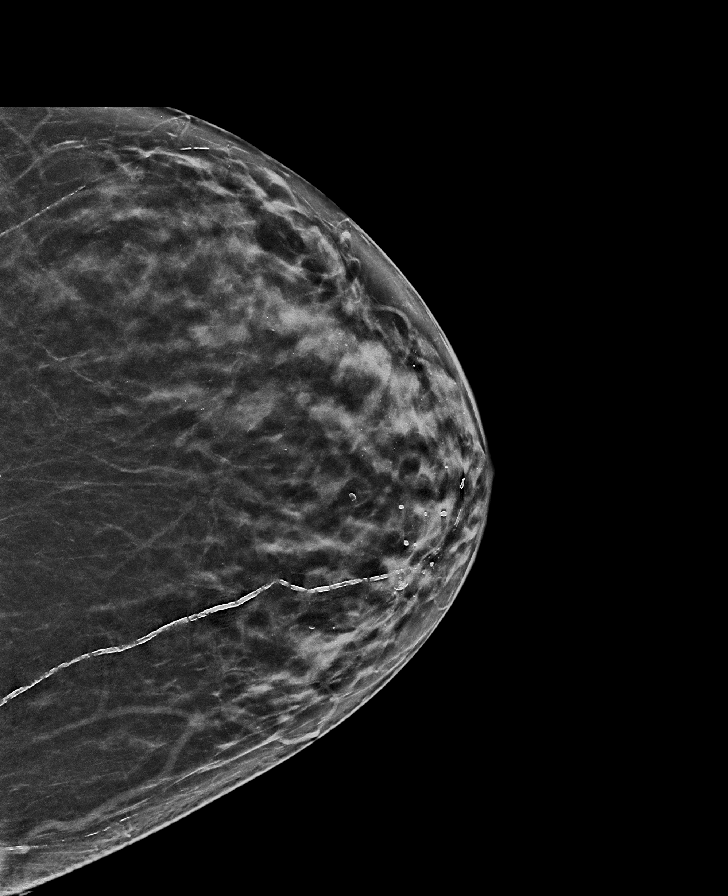

[L MLO synth-2D]
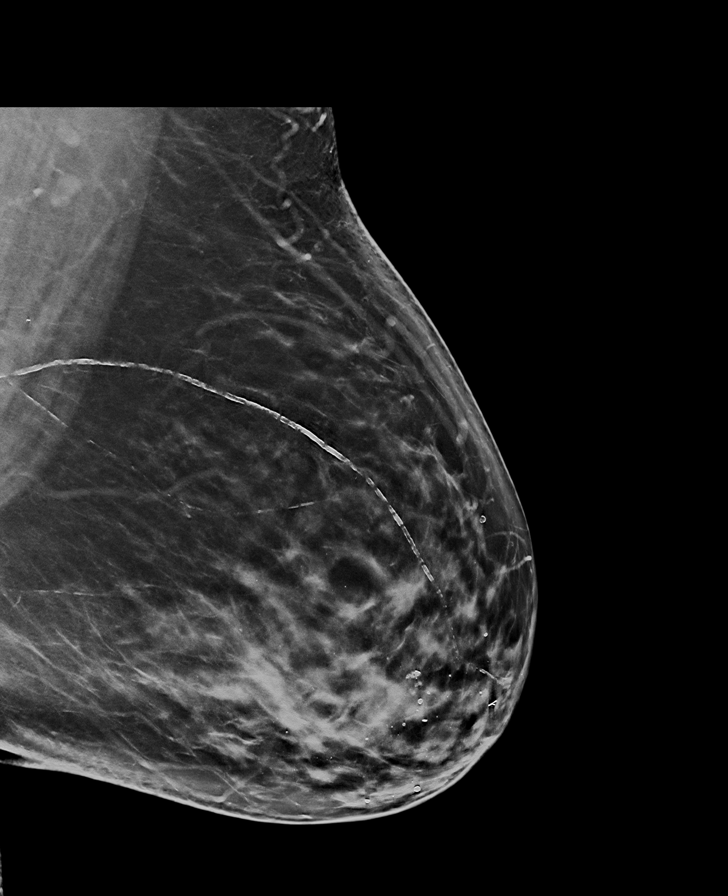

[R MLO]
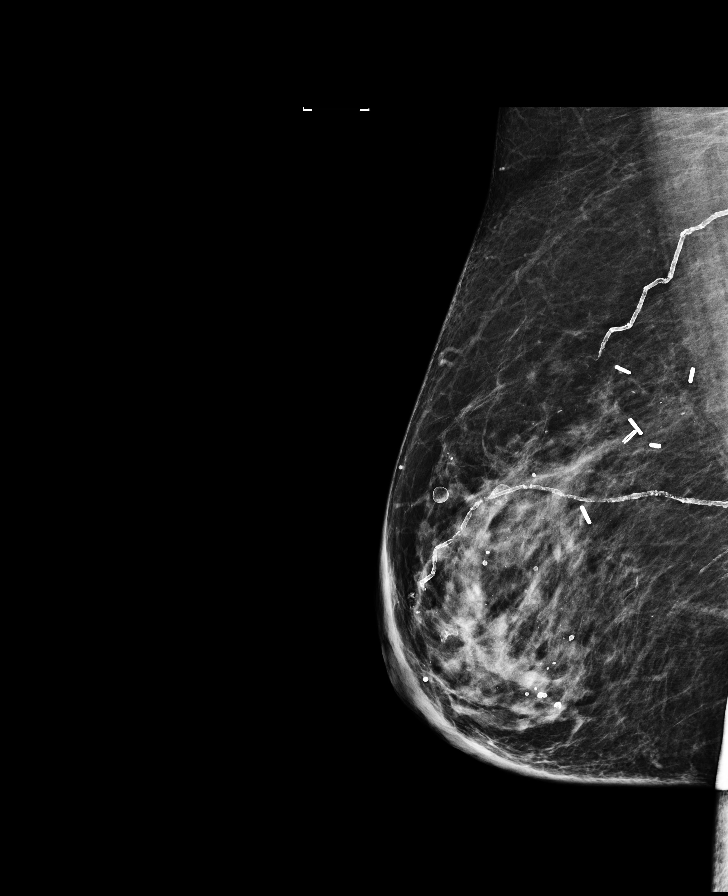

[R CC synth-2D]
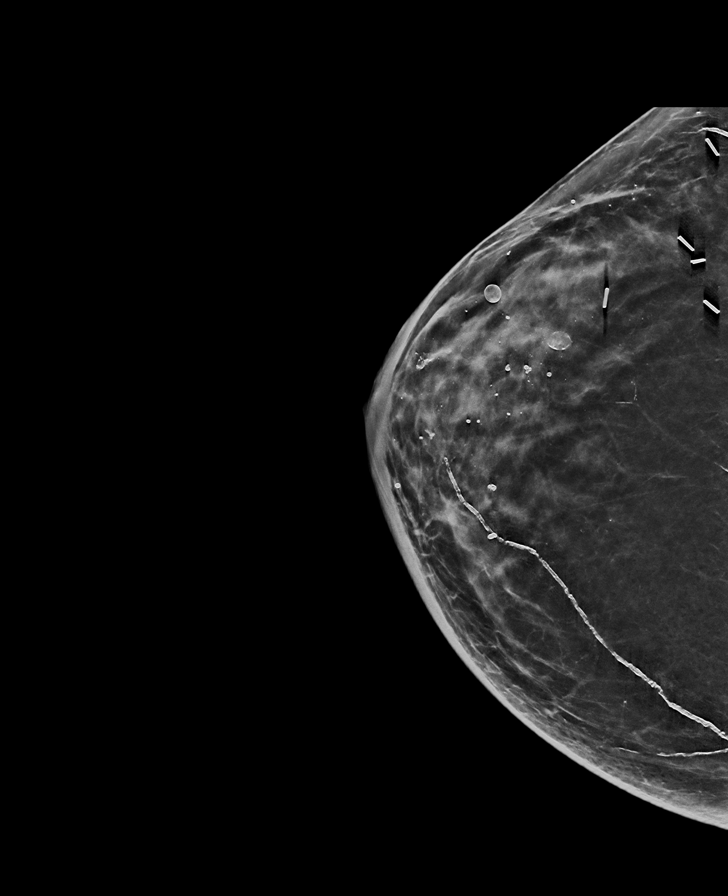

[L CC]
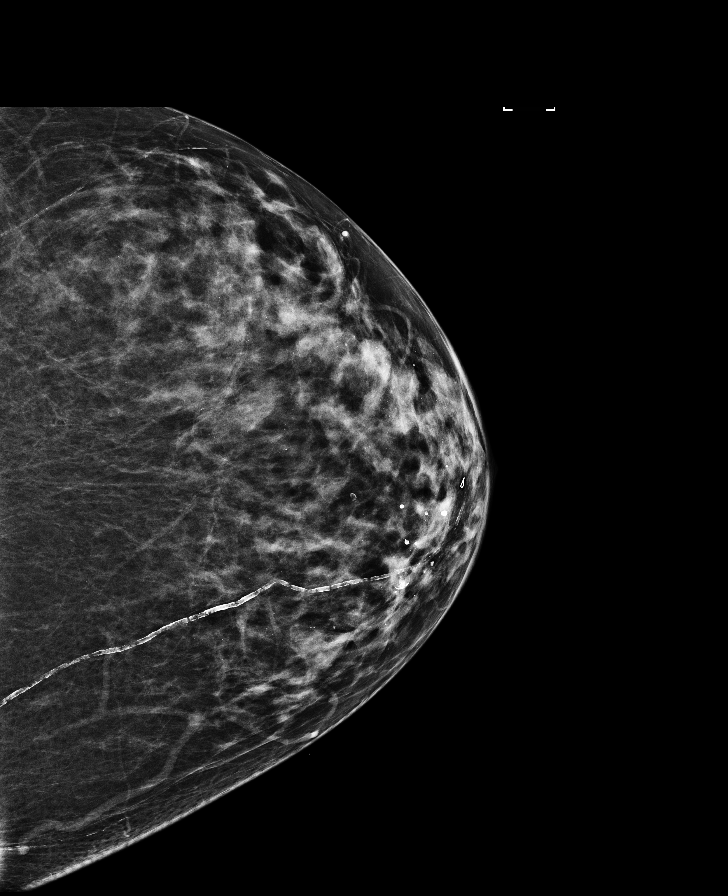

[L MLO]
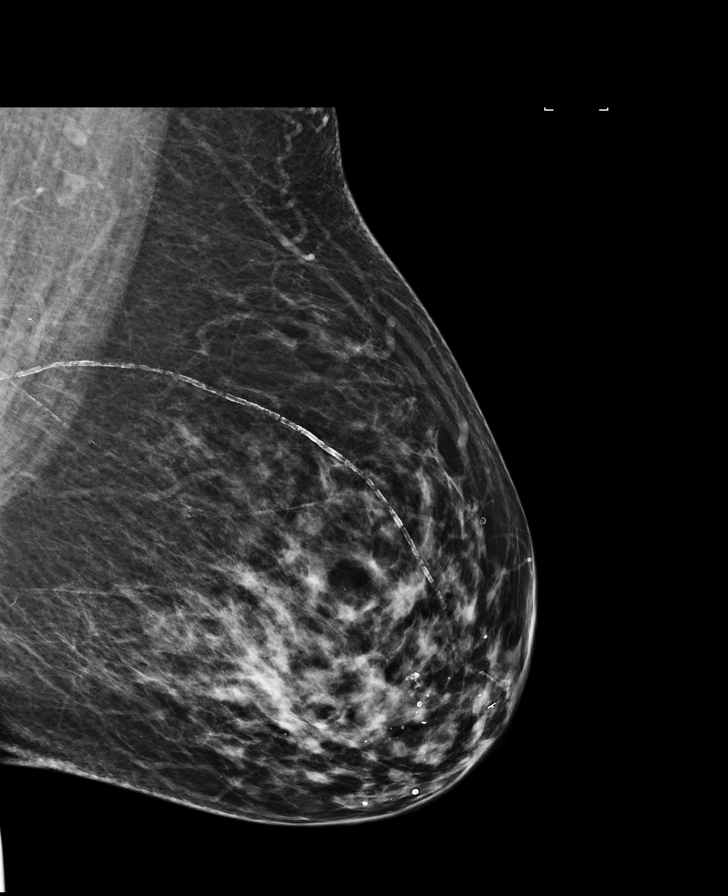

[8 of 29 positions shown; findings below may reference images not displayed]

ACR Breast Density Category c: The breast tissue is heterogeneously
dense, which may obscure small masses.
FINDINGS: There are stable postsurgical changes within the right breast. There
are no new dominant masses, suspicious calcifications or secondary
signs of malignancy within either breast.

Mammographic images were processed with CAD.
IMPRESSION: No evidence of malignancy. Stable postsurgical changes in the right
breast.

RECOMMENDATION:
Bilateral diagnostic mammogram in 1 year.

I have discussed the findings and recommendations with the patient.
Results were also provided in writing at the conclusion of the
visit. If applicable, a reminder letter will be sent to the patient
regarding the next appointment.

BI-RADS CATEGORY  2: Benign.

## 2017-09-22 ENCOUNTER — Ambulatory Visit: Payer: PPO | Admitting: Pediatrics

## 2017-09-23 ENCOUNTER — Ambulatory Visit (INDEPENDENT_AMBULATORY_CARE_PROVIDER_SITE_OTHER): Payer: PPO | Admitting: Pediatrics

## 2017-09-23 ENCOUNTER — Encounter: Payer: Self-pay | Admitting: Pediatrics

## 2017-09-23 VITALS — BP 113/65 | HR 70 | Temp 97.8°F | Ht 71.0 in | Wt 272.0 lb

## 2017-09-23 DIAGNOSIS — J4489 Other specified chronic obstructive pulmonary disease: Secondary | ICD-10-CM

## 2017-09-23 DIAGNOSIS — I482 Chronic atrial fibrillation, unspecified: Secondary | ICD-10-CM

## 2017-09-23 DIAGNOSIS — C229 Malignant neoplasm of liver, not specified as primary or secondary: Secondary | ICD-10-CM

## 2017-09-23 DIAGNOSIS — J449 Chronic obstructive pulmonary disease, unspecified: Secondary | ICD-10-CM

## 2017-09-23 DIAGNOSIS — Z23 Encounter for immunization: Secondary | ICD-10-CM

## 2017-09-23 DIAGNOSIS — M7989 Other specified soft tissue disorders: Secondary | ICD-10-CM

## 2017-09-23 DIAGNOSIS — R609 Edema, unspecified: Secondary | ICD-10-CM | POA: Diagnosis not present

## 2017-09-23 DIAGNOSIS — C3431 Malignant neoplasm of lower lobe, right bronchus or lung: Secondary | ICD-10-CM

## 2017-09-23 DIAGNOSIS — R600 Localized edema: Secondary | ICD-10-CM

## 2017-09-23 MED ORDER — WARFARIN SODIUM 2.5 MG PO TABS: 2.5000 mg | ORAL_TABLET | Freq: Every day | ORAL | 1 refills | Status: DC

## 2017-09-23 MED ORDER — AMLODIPINE BESYLATE 5 MG PO TABS: 5.0000 mg | ORAL_TABLET | Freq: Every day | ORAL | 1 refills | Status: DC

## 2017-09-23 MED ORDER — FUROSEMIDE 40 MG PO TABS: ORAL_TABLET | ORAL | 1 refills | Status: DC

## 2017-09-23 NOTE — Progress Notes (Signed)
  Subjective:   Patient ID: Tammie [name], female    DOB: 03-27-1941, 76 y.o.   MRN: [id number] CC: Follow-up (3 month) multiple med problems HPI: Tammie [name] is a 76 y.o. female presenting for Follow-up (3 month)  Following with oncology for lung cancer, colon cancer Mood has been ok Getting chemotherapy q3 weeks  Acute URI--symptoms for several days Feeling better today  Afib: no palpitations No CP or bleeding Gets INR checked with oncology q3 weeks  HTN: takes meds regularly  Breathing has been ok, no SOB  Relevant past medical, surgical, family and social history reviewed. Allergies and medications reviewed and updated. History  Smoking Status  . Former Smoker  . Packs/day: 3.00  . Years: 32.00  . Types: Cigarettes  . Start date: 12/27/1989  . Quit date: 03/08/1990  Smokeless Tobacco  . Former Systems developer  . Quit date: 03/08/1990    Comment: smoked 3ppd from 1959-1991    ROS: Per HPI   Objective:    BP 113/65   Pulse 70   Temp 97.8 F (36.6 C) (Oral)   Ht 5\' 11"  (1.803 m)   Wt 272 lb (123.4 kg)   BMI 37.94 kg/m   Wt Readings from Last 3 Encounters:  09/23/17 272 lb (123.4 kg)  08/18/17 276 lb 3.2 oz (125.3 kg)  07/28/17 272 lb 6.4 oz (123.6 kg)    Gen: NAD, alert, cooperative with exam, NCAT EYES: EOMI, no conjunctival injection, or no icterus ENT:  TMs pearly gray b/l, OP without erythema LYMPH: no cervical LAD CV: NRRR, normal S1/S2 Resp: CTABL, no wheezes, normal WOB Abd: +BS, soft, NTND.  Ext: non pitting edema present b/l, warm Neuro: Alert and oriented MSK: normal muscle bulk  Assessment & Plan:  Donyelle was seen today for follow-up med problems  Diagnoses and all orders for this visit:  HTN Stable, cont below -     amLODipine (NORVASC) 5 MG tablet; Take 1 tablet (5 mg total) by mouth daily.  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 Except take 1/2 tablet on Fridays  Need for immunization against influenza -     Flu Vaccine QUAD 36+ mos IM   Follow up plan: Return in about 6 months (around 03/23/2018). [name], MD [place]

## 2017-09-25 IMAGING — CT CT CHEST W/O CM
2 of 4 series · 15 of 36 positions shown, 18 images · non-contrast
Comparison: 12/01/2015

CLINICAL DATA: Right breast cancer.  Chemo in XRT complete

EXAM:
CT CHEST WITHOUT CONTRAST
TECHNIQUE: Multidetector CT imaging of the chest was performed following the
standard protocol without IV contrast.

[Series 2: chest w/o st · axial · non-contrast · 0.77mm/px · z∈[-332,-62]mm · 12 of 64 slices shown, 15 images]
[im 5/64  mediastinal]
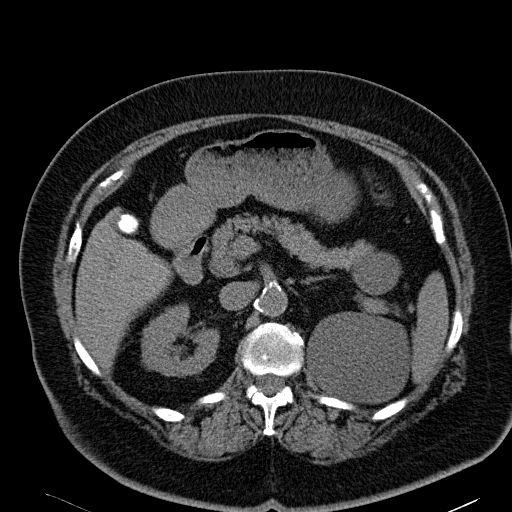
[im 5/64  lung]
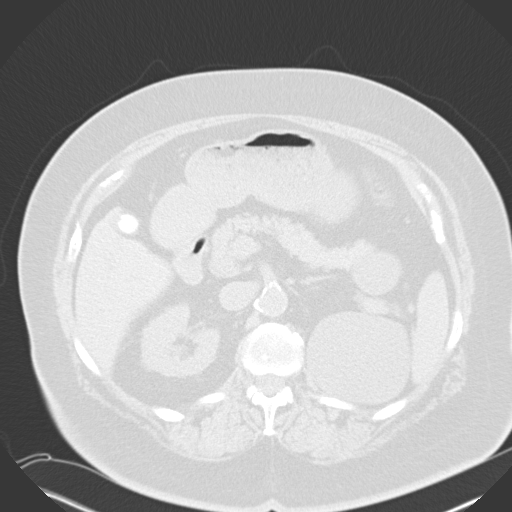
[im 10/64  lung]
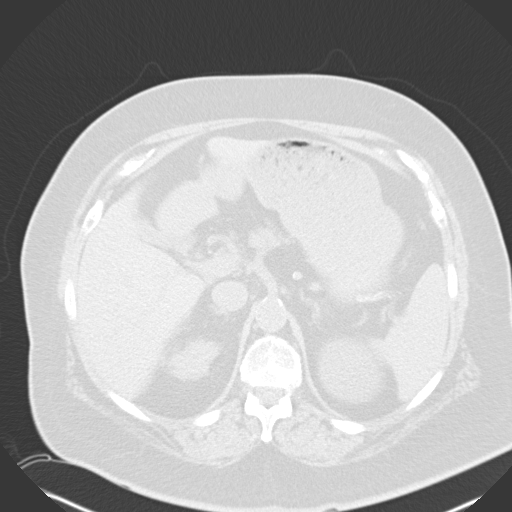
[im 14/64  lung]
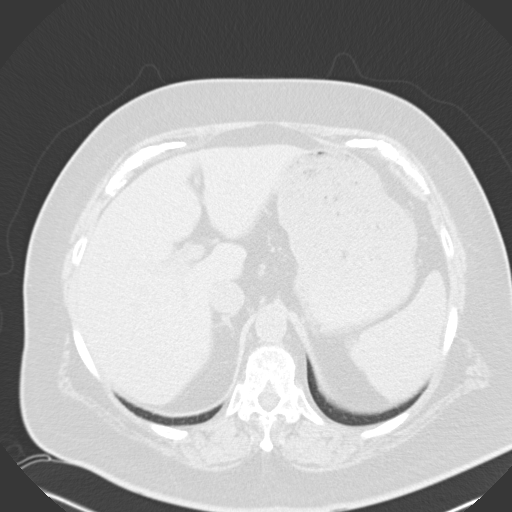
[im 19/64  lung]
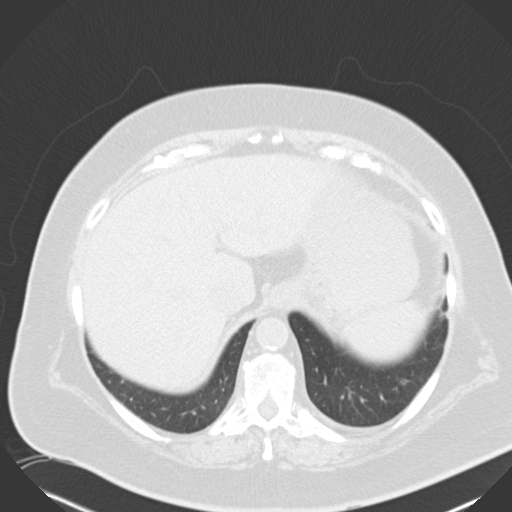
[im 23/64  mediastinal]
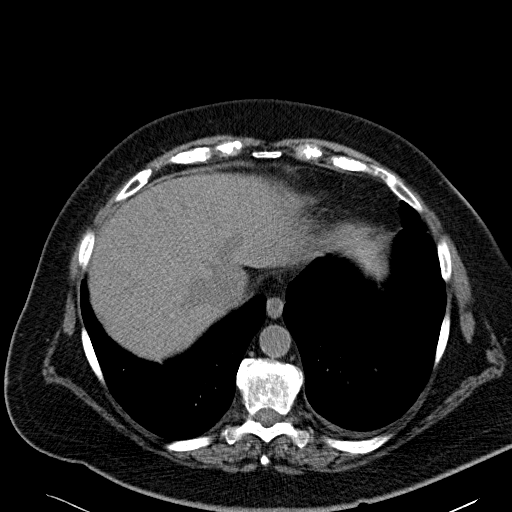
[im 23/64  lung]
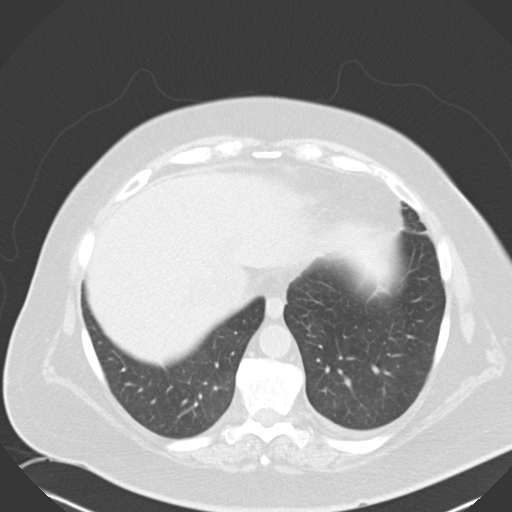
[im 28/64  lung]
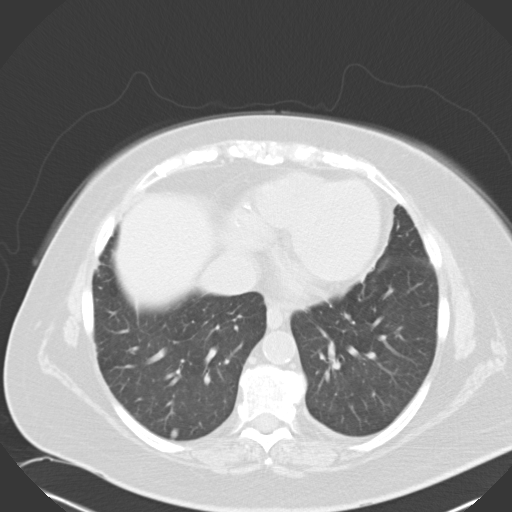
[im 37/64  lung]
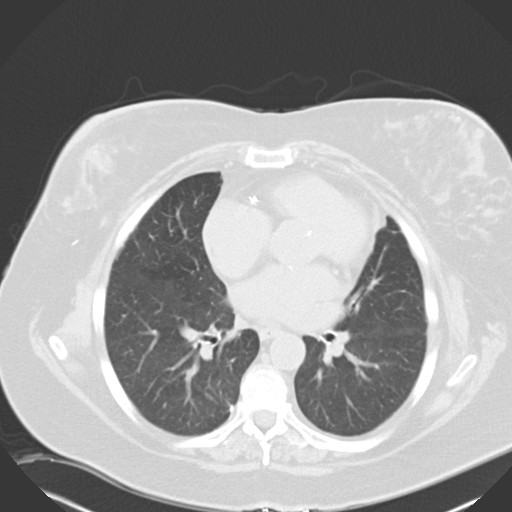
[im 41/64  lung]
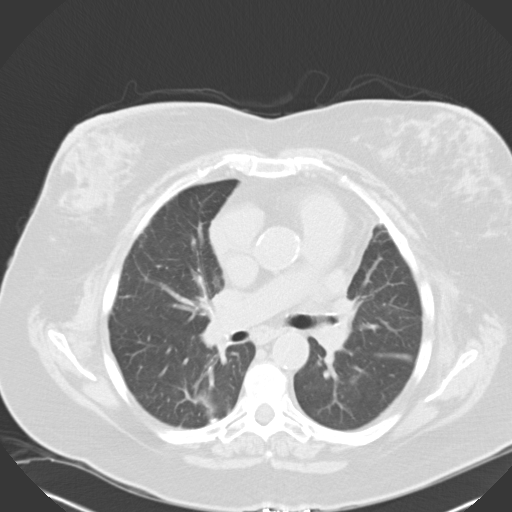
[im 46/64  mediastinal]
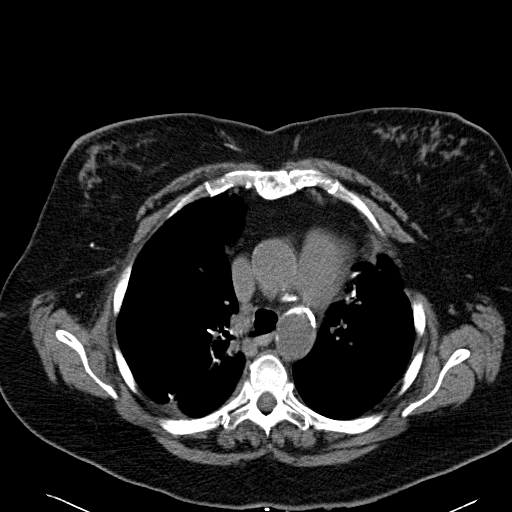
[im 46/64  lung]
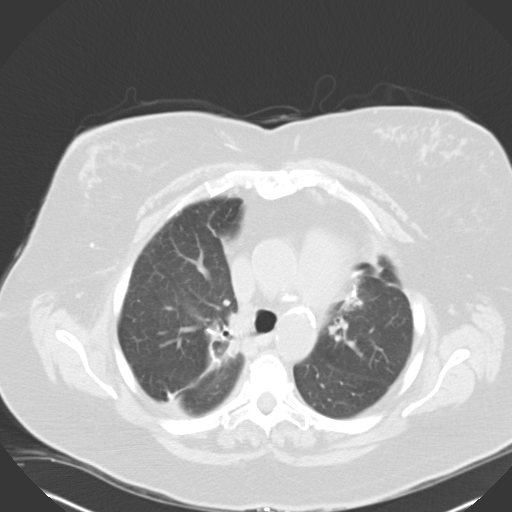
[im 50/64  lung]
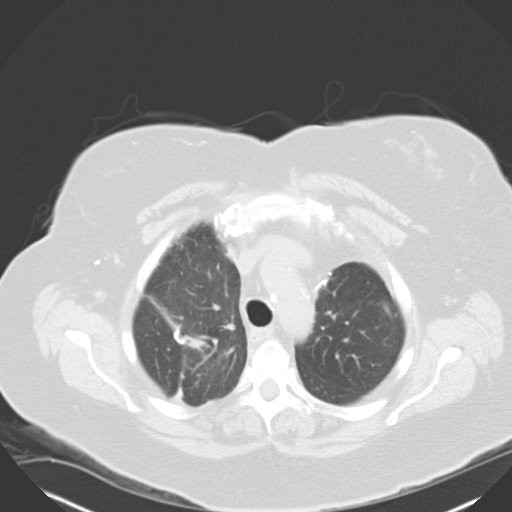
[im 55/64  lung]
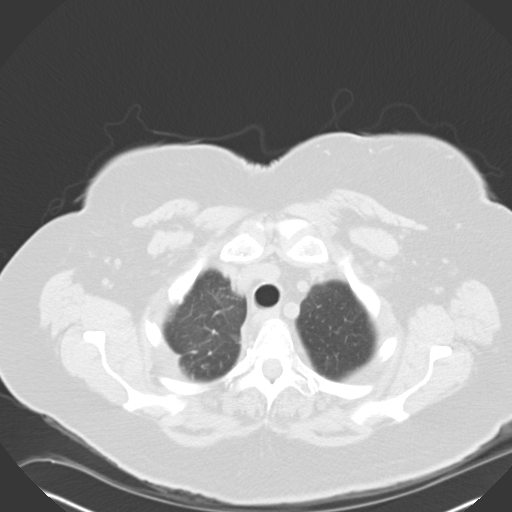
[im 59/64  lung]
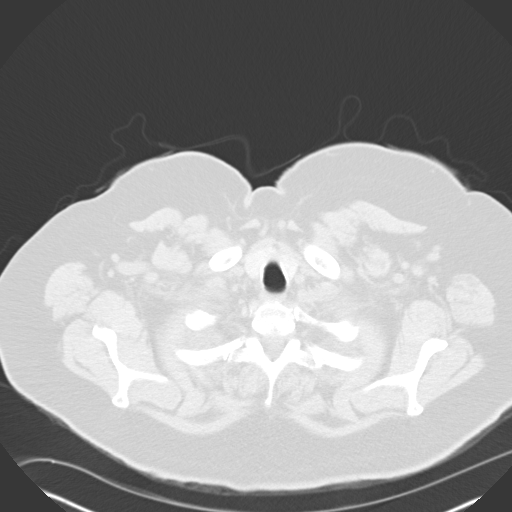

[Series 602: coronals · coronal · 0.77mm/px · 3 of 141 slices shown]
[im 29/141  lung]
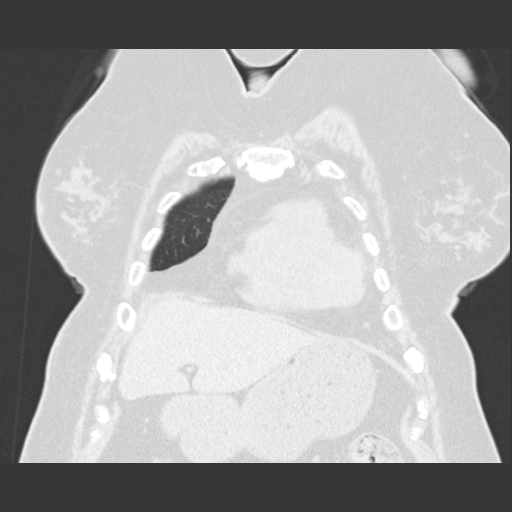
[im 57/141  lung]
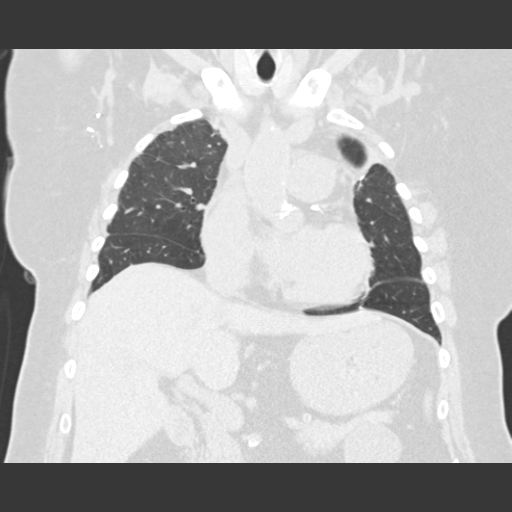
[im 85/141  lung]
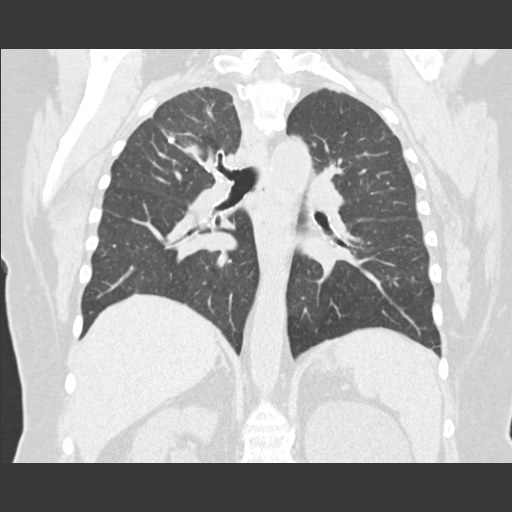

[15 of 36 positions shown; findings below may reference images not displayed]

FINDINGS: Mediastinum: There is mild cardiac enlargement. Calcification
involving the thoracic aorta as well as the LAD and RCA coronary
artery noted. No pericardial effusion identified. The trachea
appears patent and is midline. Unremarkable appearance of the
esophagus. No mediastinal or hilar adenopathy identified.

Lungs/Pleura: No pleural fluid. Postoperative changes are identified
within both lungs compatible with previous wedge resection surgery.
The ground-glass attenuating nodule within the left lower lobe is
stable measuring 1 cm, image 23 of series 5. To posterior right
lower lobe lung nodule measures 7 mm, image 37 of series 5.
Unchanged from previous exam.

Upper Abdomen: The visualized portions of the liver appear normal.
Stone within the gallbladder measures 1.9 cm. The visualized
portions of the pancreas are normal. The visualized portions of the
spleen are normal. Left renal cysts are again identified.

Musculoskeletal: Aortic atherosclerosis noted. There is no
aggressive lytic or sclerotic bone lesions.
IMPRESSION: 1. No acute findings.
2. 7 mm right lower lobe lung nodule is unchanged when compared with
previous exam. The left lower lobe ground-glass attenuating nodule
is also stable.
3. No new or enlarging pulmonary nodules, masses or lymph nodes
identified.
4. Aortic atherosclerosis and multi vessel coronary artery
calcification
5. Gallstone.

## 2017-09-29 ENCOUNTER — Ambulatory Visit (HOSPITAL_BASED_OUTPATIENT_CLINIC_OR_DEPARTMENT_OTHER): Payer: PPO

## 2017-09-29 ENCOUNTER — Other Ambulatory Visit (HOSPITAL_BASED_OUTPATIENT_CLINIC_OR_DEPARTMENT_OTHER): Payer: PPO

## 2017-09-29 ENCOUNTER — Telehealth: Payer: Self-pay | Admitting: Internal Medicine

## 2017-09-29 ENCOUNTER — Encounter: Payer: Self-pay | Admitting: Internal Medicine

## 2017-09-29 ENCOUNTER — Ambulatory Visit (HOSPITAL_BASED_OUTPATIENT_CLINIC_OR_DEPARTMENT_OTHER): Payer: PPO | Admitting: Internal Medicine

## 2017-09-29 VITALS — BP 156/67 | HR 70 | Temp 98.0°F | Resp 19 | Ht 71.0 in | Wt 273.1 lb

## 2017-09-29 DIAGNOSIS — Z5112 Encounter for antineoplastic immunotherapy: Secondary | ICD-10-CM | POA: Diagnosis not present

## 2017-09-29 DIAGNOSIS — I4891 Unspecified atrial fibrillation: Secondary | ICD-10-CM

## 2017-09-29 DIAGNOSIS — C182 Malignant neoplasm of ascending colon: Secondary | ICD-10-CM | POA: Diagnosis not present

## 2017-09-29 DIAGNOSIS — I10 Essential (primary) hypertension: Secondary | ICD-10-CM

## 2017-09-29 DIAGNOSIS — C787 Secondary malignant neoplasm of liver and intrahepatic bile duct: Secondary | ICD-10-CM

## 2017-09-29 DIAGNOSIS — Z85118 Personal history of other malignant neoplasm of bronchus and lung: Secondary | ICD-10-CM | POA: Diagnosis not present

## 2017-09-29 DIAGNOSIS — C3431 Malignant neoplasm of lower lobe, right bronchus or lung: Secondary | ICD-10-CM

## 2017-09-29 DIAGNOSIS — Z803 Family history of malignant neoplasm of breast: Secondary | ICD-10-CM | POA: Diagnosis not present

## 2017-09-29 DIAGNOSIS — C229 Malignant neoplasm of liver, not specified as primary or secondary: Secondary | ICD-10-CM

## 2017-09-29 DIAGNOSIS — C189 Malignant neoplasm of colon, unspecified: Secondary | ICD-10-CM

## 2017-09-29 LAB — COMPREHENSIVE METABOLIC PANEL
ALT: 10 U/L (ref 0–55)
AST: 16 U/L (ref 5–34)
Albumin: 3.6 g/dL (ref 3.5–5.0)
Alkaline Phosphatase: 58 U/L (ref 40–150)
Anion Gap: 9 mEq/L (ref 3–11)
BUN: 21.9 mg/dL (ref 7.0–26.0)
CHLORIDE: 108 meq/L (ref 98–109)
CO2: 29 meq/L (ref 22–29)
Calcium: 9.3 mg/dL (ref 8.4–10.4)
Creatinine: 1.5 mg/dL — ABNORMAL HIGH (ref 0.6–1.1)
EGFR: 34 mL/min/{1.73_m2} — AB (ref >90)
GLUCOSE: 87 mg/dL (ref 70–140)
POTASSIUM: 4.2 meq/L (ref 3.5–5.1)
SODIUM: 145 meq/L (ref 136–145)
Total Bilirubin: 0.48 mg/dL (ref 0.20–1.20)
Total Protein: 7.2 g/dL (ref 6.4–8.3)

## 2017-09-29 LAB — CBC WITH DIFFERENTIAL/PLATELET
BASO%: 0.7 % (ref 0.0–2.0)
BASOS ABS: 0 10*3/uL (ref 0.0–0.1)
EOS ABS: 0.2 10*3/uL (ref 0.0–0.5)
EOS%: 4.4 % (ref 0.0–7.0)
HCT: 44.4 % (ref 34.8–46.6)
HGB: 14.1 g/dL (ref 11.6–15.9)
LYMPH%: 18.5 % (ref 14.0–49.7)
MCH: 28.1 pg (ref 25.1–34.0)
MCHC: 31.8 g/dL (ref 31.5–36.0)
MCV: 88.6 fL (ref 79.5–101.0)
MONO#: 0.4 10*3/uL (ref 0.1–0.9)
MONO%: 8 % (ref 0.0–14.0)
NEUT%: 68.4 % (ref 38.4–76.8)
NEUTROS ABS: 3.8 10*3/uL (ref 1.5–6.5)
Platelets: 157 10*3/uL (ref 145–400)
RBC: 5.01 10*6/uL (ref 3.70–5.45)
RDW: 14.3 % (ref 11.2–14.5)
WBC: 5.5 10*3/uL (ref 3.9–10.3)
lymph#: 1 10*3/uL (ref 0.9–3.3)

## 2017-09-29 LAB — PROTIME-INR
INR: 2.4 (ref 2.00–3.50)
PROTIME: 28.8 s — AB (ref 10.6–13.4)

## 2017-09-29 MED ORDER — SODIUM CHLORIDE 0.9 % IV SOLN
Freq: Once | INTRAVENOUS | Status: AC
  Administered 2017-09-29: 14:00:00 via INTRAVENOUS

## 2017-09-29 MED ORDER — HEPARIN SOD (PORK) LOCK FLUSH 100 UNIT/ML IV SOLN
500.0000 [IU] | Freq: Once | INTRAVENOUS | Status: AC | PRN
  Administered 2017-09-29: 500 [IU]
  Filled 2017-09-29: qty 5

## 2017-09-29 MED ORDER — SODIUM CHLORIDE 0.9 % IV SOLN
200.0000 mg | Freq: Once | INTRAVENOUS | Status: AC
  Administered 2017-09-29: 200 mg via INTRAVENOUS
  Filled 2017-09-29: qty 8

## 2017-09-29 MED ORDER — SODIUM CHLORIDE 0.9% FLUSH
10.0000 mL | INTRAVENOUS | Status: DC | PRN
  Administered 2017-09-29: 10 mL
  Filled 2017-09-29: qty 10
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
## 2017-09-29 NOTE — Telephone Encounter (Signed)
Scheduled appt per 10/4 los - Gave patient AVS and calender per los.  

## 2017-10-17 ENCOUNTER — Telehealth: Payer: Self-pay | Admitting: Internal Medicine

## 2017-10-17 NOTE — Telephone Encounter (Signed)
MM PAL - moved 10/25 f/u to [facility] and adjusted associated appointments. Spoke with patient she is aware.

## 2017-10-20 ENCOUNTER — Ambulatory Visit (HOSPITAL_BASED_OUTPATIENT_CLINIC_OR_DEPARTMENT_OTHER): Payer: PPO | Admitting: Oncology

## 2017-10-20 ENCOUNTER — Other Ambulatory Visit: Payer: Self-pay | Admitting: Medical Oncology

## 2017-10-20 ENCOUNTER — Encounter: Payer: Self-pay | Admitting: Oncology

## 2017-10-20 ENCOUNTER — Other Ambulatory Visit (HOSPITAL_BASED_OUTPATIENT_CLINIC_OR_DEPARTMENT_OTHER): Payer: PPO

## 2017-10-20 ENCOUNTER — Ambulatory Visit (HOSPITAL_BASED_OUTPATIENT_CLINIC_OR_DEPARTMENT_OTHER): Payer: PPO

## 2017-10-20 ENCOUNTER — Encounter: Payer: Self-pay | Admitting: Internal Medicine

## 2017-10-20 VITALS — BP 154/72 | HR 69 | Temp 98.4°F | Resp 18 | Ht 71.0 in | Wt 278.0 lb

## 2017-10-20 DIAGNOSIS — C182 Malignant neoplasm of ascending colon: Secondary | ICD-10-CM

## 2017-10-20 DIAGNOSIS — Z91041 Radiographic dye allergy status: Secondary | ICD-10-CM

## 2017-10-20 DIAGNOSIS — C3431 Malignant neoplasm of lower lobe, right bronchus or lung: Secondary | ICD-10-CM

## 2017-10-20 DIAGNOSIS — I4891 Unspecified atrial fibrillation: Secondary | ICD-10-CM

## 2017-10-20 DIAGNOSIS — C50411 Malignant neoplasm of upper-outer quadrant of right female breast: Secondary | ICD-10-CM

## 2017-10-20 DIAGNOSIS — Z5112 Encounter for antineoplastic immunotherapy: Secondary | ICD-10-CM | POA: Diagnosis not present

## 2017-10-20 DIAGNOSIS — N289 Disorder of kidney and ureter, unspecified: Secondary | ICD-10-CM

## 2017-10-20 DIAGNOSIS — C229 Malignant neoplasm of liver, not specified as primary or secondary: Secondary | ICD-10-CM

## 2017-10-20 DIAGNOSIS — C787 Secondary malignant neoplasm of liver and intrahepatic bile duct: Secondary | ICD-10-CM

## 2017-10-20 DIAGNOSIS — C189 Malignant neoplasm of colon, unspecified: Secondary | ICD-10-CM

## 2017-10-20 DIAGNOSIS — I10 Essential (primary) hypertension: Secondary | ICD-10-CM

## 2017-10-20 DIAGNOSIS — Z853 Personal history of malignant neoplasm of breast: Secondary | ICD-10-CM | POA: Diagnosis not present

## 2017-10-20 DIAGNOSIS — Z85118 Personal history of other malignant neoplasm of bronchus and lung: Secondary | ICD-10-CM | POA: Diagnosis not present

## 2017-10-20 LAB — CBC WITH DIFFERENTIAL/PLATELET
BASO%: 0.7 % (ref 0.0–2.0)
Basophils Absolute: 0 10*3/uL (ref 0.0–0.1)
EOS%: 4.3 % (ref 0.0–7.0)
Eosinophils Absolute: 0.3 10*3/uL (ref 0.0–0.5)
HCT: 42 % (ref 34.8–46.6)
HGB: 13.2 g/dL (ref 11.6–15.9)
LYMPH%: 19.4 % (ref 14.0–49.7)
MCH: 28.1 pg (ref 25.1–34.0)
MCHC: 31.4 g/dL — ABNORMAL LOW (ref 31.5–36.0)
MCV: 89.4 fL (ref 79.5–101.0)
MONO#: 0.4 10*3/uL (ref 0.1–0.9)
MONO%: 7.4 % (ref 0.0–14.0)
NEUT%: 68.2 % (ref 38.4–76.8)
NEUTROS ABS: 4 10*3/uL (ref 1.5–6.5)
Platelets: 144 10*3/uL — ABNORMAL LOW (ref 145–400)
RBC: 4.7 10*6/uL (ref 3.70–5.45)
RDW: 14.2 % (ref 11.2–14.5)
WBC: 5.8 10*3/uL (ref 3.9–10.3)
lymph#: 1.1 10*3/uL (ref 0.9–3.3)

## 2017-10-20 LAB — PROTIME-INR
INR: 2.5 (ref 2.00–3.50)
PROTIME: 30 s — AB (ref 10.6–13.4)

## 2017-10-20 LAB — COMPREHENSIVE METABOLIC PANEL
ALT: 11 U/L (ref 0–55)
AST: 15 U/L (ref 5–34)
Albumin: 3.6 g/dL (ref 3.5–5.0)
Alkaline Phosphatase: 60 U/L (ref 40–150)
Anion Gap: 9 mEq/L (ref 3–11)
BILIRUBIN TOTAL: 0.5 mg/dL (ref 0.20–1.20)
BUN: 26.4 mg/dL — ABNORMAL HIGH (ref 7.0–26.0)
CO2: 27 meq/L (ref 22–29)
CREATININE: 1.6 mg/dL — AB (ref 0.6–1.1)
Calcium: 9 mg/dL (ref 8.4–10.4)
Chloride: 110 mEq/L — ABNORMAL HIGH (ref 98–109)
EGFR: 31 mL/min/{1.73_m2} — AB (ref >60)
GLUCOSE: 100 mg/dL (ref 70–140)
Potassium: 4.7 mEq/L (ref 3.5–5.1)
SODIUM: 146 meq/L — AB (ref 136–145)
TOTAL PROTEIN: 6.9 g/dL (ref 6.4–8.3)

## 2017-10-20 MED ORDER — HEPARIN SOD (PORK) LOCK FLUSH 100 UNIT/ML IV SOLN
500.0000 [IU] | Freq: Once | INTRAVENOUS | Status: AC | PRN
  Administered 2017-10-20: 500 [IU]
  Filled 2017-10-20: qty 5

## 2017-10-20 MED ORDER — SODIUM CHLORIDE 0.9 % IV SOLN
200.0000 mg | Freq: Once | INTRAVENOUS | Status: AC
  Administered 2017-10-20: 200 mg via INTRAVENOUS
  Filled 2017-10-20: qty 8

## 2017-10-20 MED ORDER — SODIUM CHLORIDE 0.9 % IV SOLN
Freq: Once | INTRAVENOUS | Status: AC
  Administered 2017-10-20: 12:00:00 via INTRAVENOUS

## 2017-10-20 MED ORDER — SODIUM CHLORIDE 0.9% FLUSH
10.0000 mL | INTRAVENOUS | Status: DC | PRN
  Administered 2017-10-20: 10 mL
  Filled 2017-10-20: qty 10
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
## 2017-10-20 NOTE — Progress Notes (Signed)
Patient came in with questions about a bill received and states she has met her deductible.  Asked patient if she has contacted billing. She states no. Called billing([facility]) with patient present and introduced myself and gave patient the phone.  Spoke back with [place] who states she put in for a review and to allow them about 2 weeks to review. I requested patient be contacted once review has been complete. She verified patient's number and also received my contact number.  Patient has my card for any additional financial questions or concerns.

## 2017-10-20 NOTE — Patient Instructions (Signed)
[facility] Discharge Instructions for Patients Receiving Chemotherapy  Today you received the following chemotherapy agents:  Keytruda.  To help prevent nausea and vomiting after your treatment, we encourage you to take your nausea medication as directed.   If you develop nausea and vomiting that is not controlled by your nausea medication, call the clinic.   BELOW ARE SYMPTOMS THAT SHOULD BE REPORTED IMMEDIATELY:  *FEVER GREATER THAN 100.5 F  *CHILLS WITH OR WITHOUT FEVER  NAUSEA AND VOMITING THAT IS NOT CONTROLLED WITH YOUR NAUSEA MEDICATION  *UNUSUAL SHORTNESS OF BREATH  *UNUSUAL BRUISING OR BLEEDING  TENDERNESS IN MOUTH AND THROAT WITH OR WITHOUT PRESENCE OF ULCERS  *URINARY PROBLEMS  *BOWEL PROBLEMS  UNUSUAL RASH Items with * indicate a potential emergency and should be followed up as soon as possible.  Feel free to call the clinic should you have any questions or concerns. The clinic phone number is [phone].  Please show the CHEMO ALERT CARD at check-in to the Emergency Department and triage nurse.    

## 2017-10-20 NOTE — Progress Notes (Signed)
OK to treat with Creatinine of 1.6 per Mikey Bussing, NP

## 2017-10-21 NOTE — Assessment & Plan Note (Signed)
This is a pleasant 76 year old white female was multiple malignancies including history of breast cancer, history of lung cancer status post resection and most recently treated with metastatic colon adenocarcinoma with liver metastasis and MSI high. She is currently on treatment with Ketruda 200 mg IV every 3 weeks status post 14 cycles. She tolerated the last cycle of her treatment fairly well with no significant adverse effects. I recommended for the patient to proceed with cycle #15 today as a scheduled. Patient have a restaging CT scan prior to her next visit. This will be done without IV contrast due to renal insufficiency and her allergy to contrast media.  I would see her back for follow-up visit in 3 weeks for evaluation before starting the next dose of her treatment. For hypertension, I recommended for her to continue with her blood pressure medication and to monitor it closely at home. The patient was advised to call immediately if she has any concerning symptoms in the interval. The patient voices understanding of current disease status and treatment options and is in agreement with the current care plan. All questions were answered. The patient knows to call the clinic with any problems, questions or concerns. We can certainly see the patient much sooner if necessary.

## 2017-10-24 ENCOUNTER — Telehealth: Payer: Self-pay | Admitting: Oncology

## 2017-10-24 NOTE — Telephone Encounter (Signed)
Scheduled appt per 10/25 los - patient to get new schedule next scheduled visit.

## 2017-11-03 ENCOUNTER — Ambulatory Visit (HOSPITAL_COMMUNITY)
Admission: RE | Admit: 2017-11-03 | Discharge: 2017-11-03 | Disposition: A | Discharge status: Home / Self Care | Payer: PPO | Source: Ambulatory Visit | Attending: Oncology | Admitting: Oncology

## 2017-11-03 DIAGNOSIS — M799 Soft tissue disorder, unspecified: Secondary | ICD-10-CM | POA: Diagnosis not present

## 2017-11-03 DIAGNOSIS — C3431 Malignant neoplasm of lower lobe, right bronchus or lung: Secondary | ICD-10-CM | POA: Diagnosis not present

## 2017-11-03 DIAGNOSIS — R590 Localized enlarged lymph nodes: Secondary | ICD-10-CM | POA: Diagnosis not present

## 2017-11-03 DIAGNOSIS — C50411 Malignant neoplasm of upper-outer quadrant of right female breast: Secondary | ICD-10-CM | POA: Insufficient documentation

## 2017-11-03 DIAGNOSIS — C189 Malignant neoplasm of colon, unspecified: Secondary | ICD-10-CM | POA: Diagnosis not present

## 2017-11-03 DIAGNOSIS — C787 Secondary malignant neoplasm of liver and intrahepatic bile duct: Secondary | ICD-10-CM | POA: Insufficient documentation

## 2017-11-03 DIAGNOSIS — C3490 Malignant neoplasm of unspecified part of unspecified bronchus or lung: Secondary | ICD-10-CM | POA: Diagnosis not present

## 2017-11-10 ENCOUNTER — Encounter: Payer: Self-pay | Admitting: Internal Medicine

## 2017-11-10 ENCOUNTER — Other Ambulatory Visit (HOSPITAL_BASED_OUTPATIENT_CLINIC_OR_DEPARTMENT_OTHER): Payer: PPO

## 2017-11-10 ENCOUNTER — Other Ambulatory Visit: Payer: Self-pay | Admitting: Medical Oncology

## 2017-11-10 ENCOUNTER — Ambulatory Visit (HOSPITAL_BASED_OUTPATIENT_CLINIC_OR_DEPARTMENT_OTHER): Payer: PPO | Admitting: Internal Medicine

## 2017-11-10 ENCOUNTER — Ambulatory Visit (HOSPITAL_BASED_OUTPATIENT_CLINIC_OR_DEPARTMENT_OTHER): Payer: PPO

## 2017-11-10 ENCOUNTER — Telehealth: Payer: Self-pay | Admitting: Internal Medicine

## 2017-11-10 DIAGNOSIS — Z5112 Encounter for antineoplastic immunotherapy: Secondary | ICD-10-CM | POA: Diagnosis not present

## 2017-11-10 DIAGNOSIS — C787 Secondary malignant neoplasm of liver and intrahepatic bile duct: Secondary | ICD-10-CM

## 2017-11-10 DIAGNOSIS — R5383 Other fatigue: Secondary | ICD-10-CM

## 2017-11-10 DIAGNOSIS — C182 Malignant neoplasm of ascending colon: Secondary | ICD-10-CM

## 2017-11-10 DIAGNOSIS — C229 Malignant neoplasm of liver, not specified as primary or secondary: Secondary | ICD-10-CM

## 2017-11-10 DIAGNOSIS — Z7901 Long term (current) use of anticoagulants: Secondary | ICD-10-CM | POA: Diagnosis not present

## 2017-11-10 DIAGNOSIS — I4891 Unspecified atrial fibrillation: Secondary | ICD-10-CM

## 2017-11-10 DIAGNOSIS — Z85118 Personal history of other malignant neoplasm of bronchus and lung: Secondary | ICD-10-CM

## 2017-11-10 DIAGNOSIS — Z853 Personal history of malignant neoplasm of breast: Secondary | ICD-10-CM | POA: Diagnosis not present

## 2017-11-10 DIAGNOSIS — C3431 Malignant neoplasm of lower lobe, right bronchus or lung: Secondary | ICD-10-CM

## 2017-11-10 DIAGNOSIS — Z79899 Other long term (current) drug therapy: Secondary | ICD-10-CM | POA: Diagnosis not present

## 2017-11-10 DIAGNOSIS — C189 Malignant neoplasm of colon, unspecified: Secondary | ICD-10-CM

## 2017-11-10 LAB — CBC WITH DIFFERENTIAL/PLATELET
BASO%: 0.8 % (ref 0.0–2.0)
Basophils Absolute: 0.1 10*3/uL (ref 0.0–0.1)
EOS%: 4.1 % (ref 0.0–7.0)
Eosinophils Absolute: 0.3 10*3/uL (ref 0.0–0.5)
HCT: 43.9 % (ref 34.8–46.6)
HGB: 13.8 g/dL (ref 11.6–15.9)
LYMPH%: 17.6 % (ref 14.0–49.7)
MCH: 28.3 pg (ref 25.1–34.0)
MCHC: 31.4 g/dL — ABNORMAL LOW (ref 31.5–36.0)
MCV: 90 fL (ref 79.5–101.0)
MONO#: 0.6 10*3/uL (ref 0.1–0.9)
MONO%: 9 % (ref 0.0–14.0)
NEUT%: 68.5 % (ref 38.4–76.8)
NEUTROS ABS: 4.2 10*3/uL (ref 1.5–6.5)
Platelets: 155 10*3/uL (ref 145–400)
RBC: 4.88 10*6/uL (ref 3.70–5.45)
RDW: 14.2 % (ref 11.2–14.5)
WBC: 6.1 10*3/uL (ref 3.9–10.3)
lymph#: 1.1 10*3/uL (ref 0.9–3.3)

## 2017-11-10 LAB — COMPREHENSIVE METABOLIC PANEL
ALBUMIN: 3.6 g/dL (ref 3.5–5.0)
ALK PHOS: 56 U/L (ref 40–150)
ALT: 13 U/L (ref 0–55)
AST: 16 U/L (ref 5–34)
Anion Gap: 10 mEq/L (ref 3–11)
BUN: 30.7 mg/dL — AB (ref 7.0–26.0)
CALCIUM: 9.3 mg/dL (ref 8.4–10.4)
CO2: 28 mEq/L (ref 22–29)
Chloride: 108 mEq/L (ref 98–109)
Creatinine: 1.7 mg/dL — ABNORMAL HIGH (ref 0.6–1.1)
EGFR: 28 mL/min/{1.73_m2} — ABNORMAL LOW (ref >60)
Glucose: 93 mg/dl (ref 70–140)
Potassium: 4.8 mEq/L (ref 3.5–5.1)
Sodium: 146 mEq/L — ABNORMAL HIGH (ref 136–145)
Total Bilirubin: 0.43 mg/dL (ref 0.20–1.20)
Total Protein: 7.2 g/dL (ref 6.4–8.3)

## 2017-11-10 LAB — PROTIME-INR
INR: 2.8 (ref 2.00–3.50)
PROTIME: 33.6 s — AB (ref 10.6–13.4)

## 2017-11-10 LAB — TSH: TSH: 4.019 m[IU]/L — AB (ref 0.308–3.960)

## 2017-11-10 MED ORDER — SODIUM CHLORIDE 0.9 % IV SOLN
Freq: Once | INTRAVENOUS | Status: AC
  Administered 2017-11-10: 14:00:00 via INTRAVENOUS

## 2017-11-10 MED ORDER — HEPARIN SOD (PORK) LOCK FLUSH 100 UNIT/ML IV SOLN
500.0000 [IU] | Freq: Once | INTRAVENOUS | Status: AC | PRN
  Administered 2017-11-10: 500 [IU]
  Filled 2017-11-10: qty 5

## 2017-11-10 MED ORDER — SODIUM CHLORIDE 0.9% FLUSH
10.0000 mL | INTRAVENOUS | Status: DC | PRN
  Administered 2017-11-10: 10 mL
  Filled 2017-11-10: qty 10

## 2017-11-10 MED ORDER — SODIUM CHLORIDE 0.9 % IV SOLN
200.0000 mg | Freq: Once | INTRAVENOUS | Status: AC
  Administered 2017-11-10: 200 mg via INTRAVENOUS
  Filled 2017-11-10: qty 8

## 2017-11-10 NOTE — Patient Instructions (Signed)
[facility] Discharge Instructions for Patients Receiving Chemotherapy  Today you received the following chemotherapy agents:  Keytruda.  To help prevent nausea and vomiting after your treatment, we encourage you to take your nausea medication as directed.   If you develop nausea and vomiting that is not controlled by your nausea medication, call the clinic.   BELOW ARE SYMPTOMS THAT SHOULD BE REPORTED IMMEDIATELY:  *FEVER GREATER THAN 100.5 F  *CHILLS WITH OR WITHOUT FEVER  NAUSEA AND VOMITING THAT IS NOT CONTROLLED WITH YOUR NAUSEA MEDICATION  *UNUSUAL SHORTNESS OF BREATH  *UNUSUAL BRUISING OR BLEEDING  TENDERNESS IN MOUTH AND THROAT WITH OR WITHOUT PRESENCE OF ULCERS  *URINARY PROBLEMS  *BOWEL PROBLEMS  UNUSUAL RASH Items with * indicate a potential emergency and should be followed up as soon as possible.  Feel free to call the clinic should you have any questions or concerns. The clinic phone number is [phone].  Please show the CHEMO ALERT CARD at check-in to the Emergency Department and triage nurse.    
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
## 2017-11-10 NOTE — Progress Notes (Signed)
Per Shauna Hugh, RN who discussed pt labs with Dr. Earlie Server; pt ok to treat with creatinine 1.7.

## 2017-11-10 NOTE — Telephone Encounter (Signed)
Left message for patient regarding the changes in her appointment per 11/14 sch msg.

## 2017-11-18 ENCOUNTER — Telehealth: Payer: Self-pay | Admitting: Internal Medicine

## 2017-11-18 NOTE — Telephone Encounter (Signed)
Per 11/15 los - patient was already previously scheduled for their next three rounds of treatment.

## 2017-12-01 ENCOUNTER — Encounter: Payer: Self-pay | Admitting: Internal Medicine

## 2017-12-01 ENCOUNTER — Other Ambulatory Visit: Payer: Self-pay | Admitting: Medical Oncology

## 2017-12-01 ENCOUNTER — Ambulatory Visit (HOSPITAL_BASED_OUTPATIENT_CLINIC_OR_DEPARTMENT_OTHER): Payer: PPO

## 2017-12-01 ENCOUNTER — Ambulatory Visit (HOSPITAL_BASED_OUTPATIENT_CLINIC_OR_DEPARTMENT_OTHER): Payer: PPO | Admitting: Internal Medicine

## 2017-12-01 ENCOUNTER — Telehealth: Payer: Self-pay | Admitting: Internal Medicine

## 2017-12-01 ENCOUNTER — Other Ambulatory Visit (HOSPITAL_BASED_OUTPATIENT_CLINIC_OR_DEPARTMENT_OTHER): Payer: PPO

## 2017-12-01 VITALS — BP 156/66 | HR 75 | Temp 97.8°F | Resp 18 | Ht 71.0 in | Wt 280.7 lb

## 2017-12-01 VITALS — BP 145/61 | HR 55

## 2017-12-01 DIAGNOSIS — C189 Malignant neoplasm of colon, unspecified: Secondary | ICD-10-CM | POA: Diagnosis not present

## 2017-12-01 DIAGNOSIS — R5382 Chronic fatigue, unspecified: Secondary | ICD-10-CM

## 2017-12-01 DIAGNOSIS — Z5112 Encounter for antineoplastic immunotherapy: Secondary | ICD-10-CM

## 2017-12-01 DIAGNOSIS — C3431 Malignant neoplasm of lower lobe, right bronchus or lung: Secondary | ICD-10-CM

## 2017-12-01 DIAGNOSIS — I4891 Unspecified atrial fibrillation: Secondary | ICD-10-CM | POA: Diagnosis not present

## 2017-12-01 DIAGNOSIS — C229 Malignant neoplasm of liver, not specified as primary or secondary: Secondary | ICD-10-CM

## 2017-12-01 DIAGNOSIS — C182 Malignant neoplasm of ascending colon: Secondary | ICD-10-CM

## 2017-12-01 DIAGNOSIS — C787 Secondary malignant neoplasm of liver and intrahepatic bile duct: Secondary | ICD-10-CM | POA: Diagnosis not present

## 2017-12-01 DIAGNOSIS — Z85110 Personal history of malignant carcinoid tumor of bronchus and lung: Secondary | ICD-10-CM

## 2017-12-01 DIAGNOSIS — Z853 Personal history of malignant neoplasm of breast: Secondary | ICD-10-CM

## 2017-12-01 LAB — CBC WITH DIFFERENTIAL/PLATELET
BASO%: 0.5 % (ref 0.0–2.0)
BASOS ABS: 0 10*3/uL (ref 0.0–0.1)
EOS%: 4.2 % (ref 0.0–7.0)
Eosinophils Absolute: 0.2 10*3/uL (ref 0.0–0.5)
HCT: 40.6 % (ref 34.8–46.6)
HEMOGLOBIN: 12.9 g/dL (ref 11.6–15.9)
LYMPH%: 16 % (ref 14.0–49.7)
MCH: 28.6 pg (ref 25.1–34.0)
MCHC: 31.8 g/dL (ref 31.5–36.0)
MCV: 90 fL (ref 79.5–101.0)
MONO#: 0.5 10*3/uL (ref 0.1–0.9)
MONO%: 8.2 % (ref 0.0–14.0)
NEUT#: 3.9 10*3/uL (ref 1.5–6.5)
NEUT%: 71.1 % (ref 38.4–76.8)
Platelets: 150 10*3/uL (ref 145–400)
RBC: 4.51 10*6/uL (ref 3.70–5.45)
RDW: 14.3 % (ref 11.2–14.5)
WBC: 5.5 10*3/uL (ref 3.9–10.3)
lymph#: 0.9 10*3/uL (ref 0.9–3.3)

## 2017-12-01 LAB — COMPREHENSIVE METABOLIC PANEL
ALBUMIN: 3.5 g/dL (ref 3.5–5.0)
ALT: 14 U/L (ref 0–55)
AST: 15 U/L (ref 5–34)
Alkaline Phosphatase: 55 U/L (ref 40–150)
Anion Gap: 9 mEq/L (ref 3–11)
BUN: 21.6 mg/dL (ref 7.0–26.0)
CHLORIDE: 110 meq/L — AB (ref 98–109)
CO2: 25 mEq/L (ref 22–29)
Calcium: 8.9 mg/dL (ref 8.4–10.4)
Creatinine: 1.6 mg/dL — ABNORMAL HIGH (ref 0.6–1.1)
EGFR: 31 mL/min/{1.73_m2} — ABNORMAL LOW (ref >60)
GLUCOSE: 87 mg/dL (ref 70–140)
POTASSIUM: 4.7 meq/L (ref 3.5–5.1)
SODIUM: 144 meq/L (ref 136–145)
Total Bilirubin: 0.49 mg/dL (ref 0.20–1.20)
Total Protein: 6.8 g/dL (ref 6.4–8.3)

## 2017-12-01 LAB — PROTIME-INR
INR: 2.5 (ref 2.00–3.50)
PROTIME: 30 s — AB (ref 10.6–13.4)

## 2017-12-01 MED ORDER — SODIUM CHLORIDE 0.9% FLUSH
10.0000 mL | INTRAVENOUS | Status: DC | PRN
  Administered 2017-12-01: 10 mL
  Filled 2017-12-01: qty 10

## 2017-12-01 MED ORDER — PEMBROLIZUMAB CHEMO INJECTION 100 MG/4ML
200.0000 mg | Freq: Once | INTRAVENOUS | Status: AC
  Administered 2017-12-01: 200 mg via INTRAVENOUS
  Filled 2017-12-01: qty 8

## 2017-12-01 MED ORDER — HEPARIN SOD (PORK) LOCK FLUSH 100 UNIT/ML IV SOLN
500.0000 [IU] | Freq: Once | INTRAVENOUS | Status: AC | PRN
  Administered 2017-12-01: 500 [IU]
  Filled 2017-12-01: qty 5

## 2017-12-01 MED ORDER — SODIUM CHLORIDE 0.9 % IV SOLN
Freq: Once | INTRAVENOUS | Status: AC
  Administered 2017-12-01: 12:00:00 via INTRAVENOUS

## 2017-12-01 NOTE — Telephone Encounter (Signed)
Scheduled appt per 12/6 los - Patient to get updated schedule in the treatment area,.

## 2017-12-01 NOTE — Patient Instructions (Signed)
[facility] Discharge Instructions for Patients Receiving Chemotherapy  Today you received the following chemotherapy agents:  Keytruda.  To help prevent nausea and vomiting after your treatment, we encourage you to take your nausea medication as directed.   If you develop nausea and vomiting that is not controlled by your nausea medication, call the clinic.   BELOW ARE SYMPTOMS THAT SHOULD BE REPORTED IMMEDIATELY:  *FEVER GREATER THAN 100.5 F  *CHILLS WITH OR WITHOUT FEVER  NAUSEA AND VOMITING THAT IS NOT CONTROLLED WITH YOUR NAUSEA MEDICATION  *UNUSUAL SHORTNESS OF BREATH  *UNUSUAL BRUISING OR BLEEDING  TENDERNESS IN MOUTH AND THROAT WITH OR WITHOUT PRESENCE OF ULCERS  *URINARY PROBLEMS  *BOWEL PROBLEMS  UNUSUAL RASH Items with * indicate a potential emergency and should be followed up as soon as possible.  Feel free to call the clinic should you have any questions or concerns. The clinic phone number is [phone].  Please show the CHEMO ALERT CARD at check-in to the Emergency Department and triage nurse.    
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
## 2017-12-01 NOTE — Progress Notes (Signed)
Per [name]; Ok to treat with today's creatinine.

## 2017-12-22 ENCOUNTER — Other Ambulatory Visit (HOSPITAL_BASED_OUTPATIENT_CLINIC_OR_DEPARTMENT_OTHER): Payer: PPO

## 2017-12-22 ENCOUNTER — Telehealth: Payer: Self-pay | Admitting: Oncology

## 2017-12-22 ENCOUNTER — Encounter: Payer: Self-pay | Admitting: Oncology

## 2017-12-22 ENCOUNTER — Ambulatory Visit (HOSPITAL_BASED_OUTPATIENT_CLINIC_OR_DEPARTMENT_OTHER): Payer: PPO

## 2017-12-22 ENCOUNTER — Ambulatory Visit (HOSPITAL_BASED_OUTPATIENT_CLINIC_OR_DEPARTMENT_OTHER): Payer: PPO | Admitting: Oncology

## 2017-12-22 VITALS — BP 131/55 | HR 67 | Temp 98.1°F | Resp 20

## 2017-12-22 VITALS — BP 132/75 | HR 70 | Temp 97.9°F | Resp 19 | Ht 71.0 in | Wt 279.2 lb

## 2017-12-22 DIAGNOSIS — I4891 Unspecified atrial fibrillation: Secondary | ICD-10-CM | POA: Diagnosis not present

## 2017-12-22 DIAGNOSIS — Z5112 Encounter for antineoplastic immunotherapy: Secondary | ICD-10-CM

## 2017-12-22 DIAGNOSIS — Z79899 Other long term (current) drug therapy: Secondary | ICD-10-CM | POA: Diagnosis not present

## 2017-12-22 DIAGNOSIS — Z85118 Personal history of other malignant neoplasm of bronchus and lung: Secondary | ICD-10-CM

## 2017-12-22 DIAGNOSIS — Z7901 Long term (current) use of anticoagulants: Secondary | ICD-10-CM | POA: Diagnosis not present

## 2017-12-22 DIAGNOSIS — C229 Malignant neoplasm of liver, not specified as primary or secondary: Secondary | ICD-10-CM

## 2017-12-22 DIAGNOSIS — C787 Secondary malignant neoplasm of liver and intrahepatic bile duct: Secondary | ICD-10-CM

## 2017-12-22 DIAGNOSIS — C182 Malignant neoplasm of ascending colon: Secondary | ICD-10-CM

## 2017-12-22 DIAGNOSIS — C3431 Malignant neoplasm of lower lobe, right bronchus or lung: Secondary | ICD-10-CM

## 2017-12-22 DIAGNOSIS — Z853 Personal history of malignant neoplasm of breast: Secondary | ICD-10-CM

## 2017-12-22 DIAGNOSIS — R5382 Chronic fatigue, unspecified: Secondary | ICD-10-CM

## 2017-12-22 DIAGNOSIS — C189 Malignant neoplasm of colon, unspecified: Secondary | ICD-10-CM

## 2017-12-22 LAB — CBC WITH DIFFERENTIAL/PLATELET
BASO%: 0.8 % (ref 0.0–2.0)
BASOS ABS: 0 10*3/uL (ref 0.0–0.1)
EOS%: 6.5 % (ref 0.0–7.0)
Eosinophils Absolute: 0.3 10*3/uL (ref 0.0–0.5)
HEMATOCRIT: 41.8 % (ref 34.8–46.6)
HEMOGLOBIN: 13 g/dL (ref 11.6–15.9)
LYMPH#: 1 10*3/uL (ref 0.9–3.3)
LYMPH%: 20.6 % (ref 14.0–49.7)
MCH: 28.6 pg (ref 25.1–34.0)
MCHC: 31.1 g/dL — ABNORMAL LOW (ref 31.5–36.0)
MCV: 92.1 fL (ref 79.5–101.0)
MONO#: 0.4 10*3/uL (ref 0.1–0.9)
MONO%: 8.6 % (ref 0.0–14.0)
NEUT%: 63.5 % (ref 38.4–76.8)
NEUTROS ABS: 3 10*3/uL (ref 1.5–6.5)
Platelets: 145 10*3/uL (ref 145–400)
RBC: 4.54 10*6/uL (ref 3.70–5.45)
RDW: 14.6 % — AB (ref 11.2–14.5)
WBC: 4.8 10*3/uL (ref 3.9–10.3)

## 2017-12-22 LAB — PROTIME-INR
INR: 2.8 (ref 2.00–3.50)
Protime: 33.6 Seconds — ABNORMAL HIGH (ref 10.6–13.4)

## 2017-12-22 LAB — COMPREHENSIVE METABOLIC PANEL
ALBUMIN: 3.6 g/dL (ref 3.5–5.0)
ALK PHOS: 51 U/L (ref 40–150)
ALT: 11 U/L (ref 0–55)
ANION GAP: 8 meq/L (ref 3–11)
AST: 13 U/L (ref 5–34)
BILIRUBIN TOTAL: 0.46 mg/dL (ref 0.20–1.20)
BUN: 21.7 mg/dL (ref 7.0–26.0)
CALCIUM: 8.8 mg/dL (ref 8.4–10.4)
CO2: 27 mEq/L (ref 22–29)
Chloride: 109 mEq/L (ref 98–109)
Creatinine: 1.5 mg/dL — ABNORMAL HIGH (ref 0.6–1.1)
EGFR: 32 mL/min/{1.73_m2} — AB (ref >60)
Glucose: 102 mg/dl (ref 70–140)
SODIUM: 144 meq/L (ref 136–145)
Total Protein: 6.5 g/dL (ref 6.4–8.3)

## 2017-12-22 LAB — TSH: TSH: 3.443 m(IU)/L (ref 0.308–3.960)

## 2017-12-22 MED ORDER — SODIUM CHLORIDE 0.9 % IV SOLN
200.0000 mg | Freq: Once | INTRAVENOUS | Status: AC
  Administered 2017-12-22: 200 mg via INTRAVENOUS
  Filled 2017-12-22: qty 8

## 2017-12-22 MED ORDER — HEPARIN SOD (PORK) LOCK FLUSH 100 UNIT/ML IV SOLN
500.0000 [IU] | Freq: Once | INTRAVENOUS | Status: AC | PRN
  Administered 2017-12-22: 500 [IU]
  Filled 2017-12-22: qty 5

## 2017-12-22 MED ORDER — SODIUM CHLORIDE 0.9% FLUSH
10.0000 mL | INTRAVENOUS | Status: DC | PRN
  Administered 2017-12-22: 10 mL
  Filled 2017-12-22: qty 10

## 2017-12-22 MED ORDER — SODIUM CHLORIDE 0.9 % IV SOLN
Freq: Once | INTRAVENOUS | Status: AC
  Administered 2017-12-22: 14:00:00 via INTRAVENOUS
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
## 2017-12-22 NOTE — Telephone Encounter (Signed)
No 12/27 los.

## 2017-12-22 NOTE — Assessment & Plan Note (Signed)
This is a pleasant 76 year old white female was multiple malignancies including history of breast cancer, history of lung cancer status post resection and most recently treated with metastatic colon adenocarcinoma with liver metastasis and MSI high. She is currently on treatment with Keytruda 200 mg IV every 3 weeks status post 17 cycles. She tolerated the last cycle of her treatment with Keytruda fairly well. I recommended for her to proceed with cycle #18 today as a schedule. The patient will have a restaging CT scan of the chest, abdomen, pelvis prior to her next visit.  She will have a follow-up visit in 3 weeks for evaluation prior to cycle #19 and to review her restaging CT scan results.  The patient was advised to call if she has any concerning symptoms in the interval. The patient voices understanding of current disease status and treatment options and is in agreement with the current care plan. All questions were answered. The patient knows to call the clinic with any problems, questions or concerns. We can certainly see the patient much sooner if necessary.

## 2017-12-22 NOTE — Patient Instructions (Signed)
[facility] Discharge Instructions for Patients Receiving Chemotherapy  Today you received the following chemotherapy agents:  Keytruda.  To help prevent nausea and vomiting after your treatment, we encourage you to take your nausea medication as directed.   If you develop nausea and vomiting that is not controlled by your nausea medication, call the clinic.   BELOW ARE SYMPTOMS THAT SHOULD BE REPORTED IMMEDIATELY:  *FEVER GREATER THAN 100.5 F  *CHILLS WITH OR WITHOUT FEVER  NAUSEA AND VOMITING THAT IS NOT CONTROLLED WITH YOUR NAUSEA MEDICATION  *UNUSUAL SHORTNESS OF BREATH  *UNUSUAL BRUISING OR BLEEDING  TENDERNESS IN MOUTH AND THROAT WITH OR WITHOUT PRESENCE OF ULCERS  *URINARY PROBLEMS  *BOWEL PROBLEMS  UNUSUAL RASH Items with * indicate a potential emergency and should be followed up as soon as possible.  Feel free to call the clinic should you have any questions or concerns. The clinic phone number is [phone].  Please show the CHEMO ALERT CARD at check-in to the Emergency Department and triage nurse.    

## 2018-01-10 ENCOUNTER — Encounter (HOSPITAL_COMMUNITY): Payer: Self-pay

## 2018-01-10 ENCOUNTER — Ambulatory Visit (HOSPITAL_COMMUNITY)
Admission: RE | Admit: 2018-01-10 | Discharge: 2018-01-10 | Disposition: A | Discharge status: Home / Self Care | Payer: PPO | Source: Ambulatory Visit | Attending: Oncology | Admitting: Oncology

## 2018-01-10 DIAGNOSIS — R918 Other nonspecific abnormal finding of lung field: Secondary | ICD-10-CM | POA: Diagnosis not present

## 2018-01-10 DIAGNOSIS — I700 Atherosclerosis of aorta: Secondary | ICD-10-CM | POA: Diagnosis not present

## 2018-01-10 DIAGNOSIS — C3431 Malignant neoplasm of lower lobe, right bronchus or lung: Secondary | ICD-10-CM | POA: Diagnosis not present

## 2018-01-10 DIAGNOSIS — K7689 Other specified diseases of liver: Secondary | ICD-10-CM | POA: Diagnosis not present

## 2018-01-10 DIAGNOSIS — R0602 Shortness of breath: Secondary | ICD-10-CM | POA: Diagnosis not present

## 2018-01-12 ENCOUNTER — Inpatient Hospital Stay: Payer: PPO | Attending: Internal Medicine

## 2018-01-12 ENCOUNTER — Inpatient Hospital Stay: Payer: PPO | Admitting: Internal Medicine

## 2018-01-12 ENCOUNTER — Telehealth: Payer: Self-pay | Admitting: Internal Medicine

## 2018-01-12 ENCOUNTER — Inpatient Hospital Stay: Payer: PPO

## 2018-01-12 VITALS — BP 131/62 | HR 67 | Temp 98.1°F | Resp 19 | Ht 71.0 in | Wt 277.3 lb

## 2018-01-12 DIAGNOSIS — C229 Malignant neoplasm of liver, not specified as primary or secondary: Secondary | ICD-10-CM

## 2018-01-12 DIAGNOSIS — I4891 Unspecified atrial fibrillation: Secondary | ICD-10-CM | POA: Diagnosis not present

## 2018-01-12 DIAGNOSIS — Z853 Personal history of malignant neoplasm of breast: Secondary | ICD-10-CM | POA: Diagnosis not present

## 2018-01-12 DIAGNOSIS — C189 Malignant neoplasm of colon, unspecified: Secondary | ICD-10-CM

## 2018-01-12 DIAGNOSIS — M7989 Other specified soft tissue disorders: Secondary | ICD-10-CM | POA: Insufficient documentation

## 2018-01-12 DIAGNOSIS — I10 Essential (primary) hypertension: Secondary | ICD-10-CM | POA: Insufficient documentation

## 2018-01-12 DIAGNOSIS — Z7901 Long term (current) use of anticoagulants: Secondary | ICD-10-CM

## 2018-01-12 DIAGNOSIS — Z79899 Other long term (current) drug therapy: Secondary | ICD-10-CM | POA: Diagnosis not present

## 2018-01-12 DIAGNOSIS — C787 Secondary malignant neoplasm of liver and intrahepatic bile duct: Secondary | ICD-10-CM

## 2018-01-12 DIAGNOSIS — C3431 Malignant neoplasm of lower lobe, right bronchus or lung: Secondary | ICD-10-CM

## 2018-01-12 DIAGNOSIS — Z85118 Personal history of other malignant neoplasm of bronchus and lung: Secondary | ICD-10-CM | POA: Insufficient documentation

## 2018-01-12 DIAGNOSIS — R5382 Chronic fatigue, unspecified: Secondary | ICD-10-CM

## 2018-01-12 DIAGNOSIS — Z5112 Encounter for antineoplastic immunotherapy: Secondary | ICD-10-CM

## 2018-01-12 DIAGNOSIS — C182 Malignant neoplasm of ascending colon: Secondary | ICD-10-CM

## 2018-01-12 LAB — COMPREHENSIVE METABOLIC PANEL
ALK PHOS: 55 U/L (ref 40–150)
ALT: 16 U/L (ref 0–55)
ANION GAP: 9 (ref 3–11)
AST: 18 U/L (ref 5–34)
Albumin: 3.5 g/dL (ref 3.5–5.0)
BILIRUBIN TOTAL: 0.5 mg/dL (ref 0.2–1.2)
BUN: 26 mg/dL (ref 7–26)
CALCIUM: 8.8 mg/dL (ref 8.4–10.4)
CO2: 27 mmol/L (ref 22–29)
Chloride: 110 mmol/L — ABNORMAL HIGH (ref 98–109)
Creatinine, Ser: 1.65 mg/dL — ABNORMAL HIGH (ref 0.60–1.10)
GFR calc non Af Amer: 29 mL/min — ABNORMAL LOW (ref >60)
GFR, EST AFRICAN AMERICAN: 34 mL/min — AB (ref >60)
Glucose, Bld: 102 mg/dL (ref 70–140)
Potassium: 4.6 mmol/L (ref 3.3–4.7)
SODIUM: 146 mmol/L — AB (ref 136–145)
TOTAL PROTEIN: 6.9 g/dL (ref 6.4–8.3)

## 2018-01-12 LAB — CBC WITH DIFFERENTIAL/PLATELET
BASOS ABS: 0.1 10*3/uL (ref 0.0–0.1)
BASOS PCT: 1 %
EOS ABS: 0.3 10*3/uL (ref 0.0–0.5)
Eosinophils Relative: 5 %
HCT: 43.4 % (ref 34.8–46.6)
Hemoglobin: 13.5 g/dL (ref 11.6–15.9)
Lymphocytes Relative: 17 %
Lymphs Abs: 0.9 10*3/uL (ref 0.9–3.3)
MCH: 28.2 pg (ref 25.1–34.0)
MCHC: 31.1 g/dL — AB (ref 31.5–36.0)
MCV: 90.8 fL (ref 79.5–101.0)
MONO ABS: 0.4 10*3/uL (ref 0.1–0.9)
MONOS PCT: 8 %
NEUTROS PCT: 69 %
Neutro Abs: 3.7 10*3/uL (ref 1.5–6.5)
Platelets: 167 10*3/uL (ref 145–400)
RBC: 4.78 MIL/uL (ref 3.70–5.45)
RDW: 14.4 % (ref 11.2–16.1)
WBC: 5.4 10*3/uL (ref 3.9–10.3)

## 2018-01-12 LAB — PROTIME-INR
INR: 1.88
Prothrombin Time: 21.5 seconds — ABNORMAL HIGH (ref 11.4–15.2)

## 2018-01-12 LAB — TSH: TSH: 4.108 u[IU]/mL — ABNORMAL HIGH (ref 0.308–3.960)

## 2018-01-12 MED ORDER — SODIUM CHLORIDE 0.9 % IV SOLN
200.0000 mg | Freq: Once | INTRAVENOUS | Status: AC
  Administered 2018-01-12: 200 mg via INTRAVENOUS
  Filled 2018-01-12: qty 8

## 2018-01-12 MED ORDER — SODIUM CHLORIDE 0.9 % IV SOLN
Freq: Once | INTRAVENOUS | Status: AC
  Administered 2018-01-12: 12:00:00 via INTRAVENOUS

## 2018-01-12 MED ORDER — HEPARIN SOD (PORK) LOCK FLUSH 100 UNIT/ML IV SOLN
500.0000 [IU] | Freq: Once | INTRAVENOUS | Status: AC | PRN
  Administered 2018-01-12: 500 [IU]
  Filled 2018-01-12: qty 5

## 2018-01-12 MED ORDER — SODIUM CHLORIDE 0.9% FLUSH
10.0000 mL | INTRAVENOUS | Status: DC | PRN
  Administered 2018-01-12: 10 mL
  Filled 2018-01-12: qty 10
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
## 2018-01-12 NOTE — Patient Instructions (Signed)
[facility] Discharge Instructions for Patients Receiving Chemotherapy  Today you received the following chemotherapy agents:  Keytruda.  To help prevent nausea and vomiting after your treatment, we encourage you to take your nausea medication as directed.   If you develop nausea and vomiting that is not controlled by your nausea medication, call the clinic.   BELOW ARE SYMPTOMS THAT SHOULD BE REPORTED IMMEDIATELY:  *FEVER GREATER THAN 100.5 F  *CHILLS WITH OR WITHOUT FEVER  NAUSEA AND VOMITING THAT IS NOT CONTROLLED WITH YOUR NAUSEA MEDICATION  *UNUSUAL SHORTNESS OF BREATH  *UNUSUAL BRUISING OR BLEEDING  TENDERNESS IN MOUTH AND THROAT WITH OR WITHOUT PRESENCE OF ULCERS  *URINARY PROBLEMS  *BOWEL PROBLEMS  UNUSUAL RASH Items with * indicate a potential emergency and should be followed up as soon as possible.  Feel free to call the clinic should you have any questions or concerns. The clinic phone number is [phone].  Please show the CHEMO ALERT CARD at check-in to the Emergency Department and triage nurse.    

## 2018-01-12 NOTE — Progress Notes (Signed)
Per Dr. [name], ok to treat with current creatinine. 

## 2018-01-12 NOTE — Telephone Encounter (Signed)
Scheduled appt per 1/17 los - Patient to get an updated schedule in the treatment area.

## 2018-02-02 ENCOUNTER — Inpatient Hospital Stay: Payer: PPO | Attending: Internal Medicine

## 2018-02-02 ENCOUNTER — Inpatient Hospital Stay: Payer: PPO | Admitting: Oncology

## 2018-02-02 ENCOUNTER — Encounter: Payer: Self-pay | Admitting: Oncology

## 2018-02-02 ENCOUNTER — Inpatient Hospital Stay: Payer: PPO

## 2018-02-02 ENCOUNTER — Telehealth: Payer: Self-pay | Admitting: Internal Medicine

## 2018-02-02 VITALS — BP 153/74 | HR 60 | Temp 98.7°F | Resp 22 | Ht 71.0 in | Wt 279.8 lb

## 2018-02-02 DIAGNOSIS — C3431 Malignant neoplasm of lower lobe, right bronchus or lung: Secondary | ICD-10-CM | POA: Diagnosis not present

## 2018-02-02 DIAGNOSIS — C182 Malignant neoplasm of ascending colon: Secondary | ICD-10-CM | POA: Diagnosis not present

## 2018-02-02 DIAGNOSIS — I4891 Unspecified atrial fibrillation: Secondary | ICD-10-CM | POA: Diagnosis not present

## 2018-02-02 DIAGNOSIS — C189 Malignant neoplasm of colon, unspecified: Secondary | ICD-10-CM

## 2018-02-02 DIAGNOSIS — R5382 Chronic fatigue, unspecified: Secondary | ICD-10-CM

## 2018-02-02 DIAGNOSIS — I10 Essential (primary) hypertension: Secondary | ICD-10-CM | POA: Insufficient documentation

## 2018-02-02 DIAGNOSIS — Z7901 Long term (current) use of anticoagulants: Secondary | ICD-10-CM | POA: Insufficient documentation

## 2018-02-02 DIAGNOSIS — Z5112 Encounter for antineoplastic immunotherapy: Secondary | ICD-10-CM

## 2018-02-02 DIAGNOSIS — C229 Malignant neoplasm of liver, not specified as primary or secondary: Secondary | ICD-10-CM

## 2018-02-02 DIAGNOSIS — C787 Secondary malignant neoplasm of liver and intrahepatic bile duct: Secondary | ICD-10-CM | POA: Diagnosis not present

## 2018-02-02 DIAGNOSIS — Z853 Personal history of malignant neoplasm of breast: Secondary | ICD-10-CM | POA: Diagnosis not present

## 2018-02-02 DIAGNOSIS — Z79899 Other long term (current) drug therapy: Secondary | ICD-10-CM | POA: Diagnosis not present

## 2018-02-02 LAB — CBC WITH DIFFERENTIAL/PLATELET
BASOS ABS: 0.1 10*3/uL (ref 0.0–0.1)
Basophils Relative: 1 %
Eosinophils Absolute: 0.3 10*3/uL (ref 0.0–0.5)
Eosinophils Relative: 5 %
HEMATOCRIT: 45.2 % (ref 34.8–46.6)
HEMOGLOBIN: 14.2 g/dL (ref 11.6–15.9)
LYMPHS ABS: 1.1 10*3/uL (ref 0.9–3.3)
LYMPHS PCT: 20 %
MCH: 28.5 pg (ref 25.1–34.0)
MCHC: 31.4 g/dL — ABNORMAL LOW (ref 31.5–36.0)
MCV: 90.6 fL (ref 79.5–101.0)
Monocytes Absolute: 0.4 10*3/uL (ref 0.1–0.9)
Monocytes Relative: 7 %
NEUTROS ABS: 3.8 10*3/uL (ref 1.5–6.5)
Neutrophils Relative %: 67 %
Platelets: 160 10*3/uL (ref 145–400)
RBC: 4.99 MIL/uL (ref 3.70–5.45)
RDW: 13.7 % (ref 11.2–14.5)
WBC: 5.7 10*3/uL (ref 3.9–10.3)

## 2018-02-02 LAB — PROTIME-INR
INR: 2.07
Prothrombin Time: 23.1 seconds — ABNORMAL HIGH (ref 11.4–15.2)

## 2018-02-02 LAB — COMPREHENSIVE METABOLIC PANEL
ALK PHOS: 63 U/L (ref 40–150)
ALT: 8 U/L (ref 0–55)
AST: 14 U/L (ref 5–34)
Albumin: 3.5 g/dL (ref 3.5–5.0)
Anion gap: 11 (ref 3–11)
BILIRUBIN TOTAL: 0.5 mg/dL (ref 0.2–1.2)
BUN: 21 mg/dL (ref 7–26)
CALCIUM: 8.8 mg/dL (ref 8.4–10.4)
CHLORIDE: 105 mmol/L (ref 98–109)
CO2: 29 mmol/L (ref 22–29)
CREATININE: 1.5 mg/dL — AB (ref 0.60–1.10)
GFR calc Af Amer: 38 mL/min — ABNORMAL LOW (ref >60)
GFR, EST NON AFRICAN AMERICAN: 33 mL/min — AB (ref >60)
Glucose, Bld: 91 mg/dL (ref 70–140)
Potassium: 4.4 mmol/L (ref 3.5–5.1)
Sodium: 145 mmol/L (ref 136–145)
TOTAL PROTEIN: 6.8 g/dL (ref 6.4–8.3)

## 2018-02-02 LAB — TSH: TSH: 5.743 u[IU]/mL — AB (ref 0.308–3.960)

## 2018-02-02 MED ORDER — HEPARIN SOD (PORK) LOCK FLUSH 100 UNIT/ML IV SOLN
500.0000 [IU] | Freq: Once | INTRAVENOUS | Status: AC | PRN
  Administered 2018-02-02: 500 [IU]
  Filled 2018-02-02: qty 5

## 2018-02-02 MED ORDER — SODIUM CHLORIDE 0.9% FLUSH
10.0000 mL | INTRAVENOUS | Status: DC | PRN
  Administered 2018-02-02: 10 mL
  Filled 2018-02-02: qty 10

## 2018-02-02 MED ORDER — SODIUM CHLORIDE 0.9 % IV SOLN
200.0000 mg | Freq: Once | INTRAVENOUS | Status: AC
  Administered 2018-02-02: 200 mg via INTRAVENOUS
  Filled 2018-02-02: qty 8

## 2018-02-02 MED ORDER — SODIUM CHLORIDE 0.9 % IV SOLN
Freq: Once | INTRAVENOUS | Status: AC
  Administered 2018-02-02: 14:00:00 via INTRAVENOUS

## 2018-02-02 NOTE — Patient Instructions (Signed)
[facility] Discharge Instructions for Patients Receiving Chemotherapy  Today you received the following chemotherapy agents:  Keytruda.  To help prevent nausea and vomiting after your treatment, we encourage you to take your nausea medication as directed.   If you develop nausea and vomiting that is not controlled by your nausea medication, call the clinic.   BELOW ARE SYMPTOMS THAT SHOULD BE REPORTED IMMEDIATELY:  *FEVER GREATER THAN 100.5 F  *CHILLS WITH OR WITHOUT FEVER  NAUSEA AND VOMITING THAT IS NOT CONTROLLED WITH YOUR NAUSEA MEDICATION  *UNUSUAL SHORTNESS OF BREATH  *UNUSUAL BRUISING OR BLEEDING  TENDERNESS IN MOUTH AND THROAT WITH OR WITHOUT PRESENCE OF ULCERS  *URINARY PROBLEMS  *BOWEL PROBLEMS  UNUSUAL RASH Items with * indicate a potential emergency and should be followed up as soon as possible.  Feel free to call the clinic should you have any questions or concerns. The clinic phone number is [phone].  Please show the CHEMO ALERT CARD at check-in to the Emergency Department and triage nurse.    

## 2018-02-02 NOTE — Telephone Encounter (Signed)
Gave avs and calendar for April and may °

## 2018-02-02 NOTE — Assessment & Plan Note (Signed)
[name] was multiple malignancies including history of breast cancer, history of lung cancer status post resection and most recently treated with metastatic colon adenocarcinoma with liver metastasis and MSI high. She is currently on treatment with Ketruda 200 mg IV every 3 weeks status post 19 cycles. The patient continues to tolerate this treatment well with no concerning complaints. Recommend that she proceed with cycle 20 today as scheduled.  Creatinine is stable at 1.5.  For the swelling of the lower extremities, I recommended for her to resume her treatment with Lasix.  Since she cannot put compression stockings on, we discussed the use of using Ace wraps to help apply pressure to her lower extremities.  I also encouraged her to elevate her legs as much as possible and to limit her sodium intake. For hypertension she was advised to take her blood pressure medication and to call her primary care physician for adjustment of her medication if no improvement. Follow-up visit will be in 3 weeks for evaluation prior to cycle #21 of her treatment.  She was advised to call immediately if she has any concerning symptoms in the interval. The patient voices understanding of current disease status and treatment options and is in agreement with the current care plan. All questions were answered. The patient knows to call the clinic with any problems, questions or concerns. We can certainly see the patient much sooner if necessary.
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
## 2018-02-23 ENCOUNTER — Inpatient Hospital Stay: Payer: PPO

## 2018-02-23 ENCOUNTER — Encounter: Payer: Self-pay | Admitting: Internal Medicine

## 2018-02-23 ENCOUNTER — Telehealth: Payer: Self-pay

## 2018-02-23 ENCOUNTER — Inpatient Hospital Stay: Payer: PPO | Admitting: Internal Medicine

## 2018-02-23 VITALS — BP 129/66 | HR 73 | Temp 98.6°F | Resp 18 | Ht 71.0 in | Wt 279.9 lb

## 2018-02-23 DIAGNOSIS — I10 Essential (primary) hypertension: Secondary | ICD-10-CM | POA: Diagnosis not present

## 2018-02-23 DIAGNOSIS — C189 Malignant neoplasm of colon, unspecified: Secondary | ICD-10-CM

## 2018-02-23 DIAGNOSIS — C787 Secondary malignant neoplasm of liver and intrahepatic bile duct: Secondary | ICD-10-CM | POA: Diagnosis not present

## 2018-02-23 DIAGNOSIS — C182 Malignant neoplasm of ascending colon: Secondary | ICD-10-CM | POA: Diagnosis not present

## 2018-02-23 DIAGNOSIS — C229 Malignant neoplasm of liver, not specified as primary or secondary: Secondary | ICD-10-CM

## 2018-02-23 DIAGNOSIS — Z5112 Encounter for antineoplastic immunotherapy: Secondary | ICD-10-CM

## 2018-02-23 DIAGNOSIS — M7989 Other specified soft tissue disorders: Secondary | ICD-10-CM

## 2018-02-23 DIAGNOSIS — C3431 Malignant neoplasm of lower lobe, right bronchus or lung: Secondary | ICD-10-CM

## 2018-02-23 DIAGNOSIS — R5382 Chronic fatigue, unspecified: Secondary | ICD-10-CM

## 2018-02-23 LAB — CBC WITH DIFFERENTIAL/PLATELET
BASOS ABS: 0.1 10*3/uL (ref 0.0–0.1)
BASOS PCT: 1 %
EOS ABS: 0.2 10*3/uL (ref 0.0–0.5)
Eosinophils Relative: 4 %
HCT: 44.2 % (ref 34.8–46.6)
Hemoglobin: 14.2 g/dL (ref 11.6–15.9)
Lymphocytes Relative: 15 %
Lymphs Abs: 0.8 10*3/uL — ABNORMAL LOW (ref 0.9–3.3)
MCH: 28 pg (ref 25.1–34.0)
MCHC: 32.2 g/dL (ref 31.5–36.0)
MCV: 86.9 fL (ref 79.5–101.0)
MONO ABS: 0.4 10*3/uL (ref 0.1–0.9)
MONOS PCT: 7 %
NEUTROS PCT: 73 %
Neutro Abs: 3.9 10*3/uL (ref 1.5–6.5)
PLATELETS: 171 10*3/uL (ref 145–400)
RBC: 5.08 MIL/uL (ref 3.70–5.45)
RDW: 14.2 % (ref 11.2–14.5)
WBC: 5.4 10*3/uL (ref 3.9–10.3)

## 2018-02-23 LAB — COMPREHENSIVE METABOLIC PANEL
ALK PHOS: 56 U/L (ref 40–150)
ALT: 10 U/L (ref 0–55)
AST: 14 U/L (ref 5–34)
Albumin: 3.6 g/dL (ref 3.5–5.0)
Anion gap: 10 (ref 3–11)
BILIRUBIN TOTAL: 0.5 mg/dL (ref 0.2–1.2)
BUN: 20 mg/dL (ref 7–26)
CALCIUM: 9.3 mg/dL (ref 8.4–10.4)
CO2: 28 mmol/L (ref 22–29)
CREATININE: 1.44 mg/dL — AB (ref 0.60–1.10)
Chloride: 107 mmol/L (ref 98–109)
GFR calc non Af Amer: 34 mL/min — ABNORMAL LOW (ref >60)
GFR, EST AFRICAN AMERICAN: 40 mL/min — AB (ref >60)
GLUCOSE: 94 mg/dL (ref 70–140)
Potassium: 4.3 mmol/L (ref 3.5–5.1)
SODIUM: 145 mmol/L (ref 136–145)
TOTAL PROTEIN: 7 g/dL (ref 6.4–8.3)

## 2018-02-23 LAB — TSH: TSH: 3.61 u[IU]/mL (ref 0.308–3.960)

## 2018-02-23 LAB — PROTIME-INR
INR: 2.07
PROTHROMBIN TIME: 23.1 s — AB (ref 11.4–15.2)

## 2018-02-23 MED ORDER — HEPARIN SOD (PORK) LOCK FLUSH 100 UNIT/ML IV SOLN
500.0000 [IU] | Freq: Once | INTRAVENOUS | Status: AC | PRN
  Administered 2018-02-23: 500 [IU]
  Filled 2018-02-23: qty 5

## 2018-02-23 MED ORDER — SODIUM CHLORIDE 0.9 % IV SOLN
Freq: Once | INTRAVENOUS | Status: AC
  Administered 2018-02-23: 13:00:00 via INTRAVENOUS

## 2018-02-23 MED ORDER — SODIUM CHLORIDE 0.9 % IV SOLN
200.0000 mg | Freq: Once | INTRAVENOUS | Status: AC
  Administered 2018-02-23: 200 mg via INTRAVENOUS
  Filled 2018-02-23: qty 8

## 2018-02-23 MED ORDER — SODIUM CHLORIDE 0.9% FLUSH
10.0000 mL | INTRAVENOUS | Status: DC | PRN
  Administered 2018-02-23: 10 mL
  Filled 2018-02-23: qty 10
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
## 2018-02-23 NOTE — Patient Instructions (Signed)
[facility] Discharge Instructions for Patients Receiving Chemotherapy  Today you received the following chemotherapy agents:  Keytruda.  To help prevent nausea and vomiting after your treatment, we encourage you to take your nausea medication as directed.   If you develop nausea and vomiting that is not controlled by your nausea medication, call the clinic.   BELOW ARE SYMPTOMS THAT SHOULD BE REPORTED IMMEDIATELY:  *FEVER GREATER THAN 100.5 F  *CHILLS WITH OR WITHOUT FEVER  NAUSEA AND VOMITING THAT IS NOT CONTROLLED WITH YOUR NAUSEA MEDICATION  *UNUSUAL SHORTNESS OF BREATH  *UNUSUAL BRUISING OR BLEEDING  TENDERNESS IN MOUTH AND THROAT WITH OR WITHOUT PRESENCE OF ULCERS  *URINARY PROBLEMS  *BOWEL PROBLEMS  UNUSUAL RASH Items with * indicate a potential emergency and should be followed up as soon as possible.  Feel free to call the clinic should you have any questions or concerns. The clinic phone number is [phone].  Please show the CHEMO ALERT CARD at check-in to the Emergency Department and triage nurse.    

## 2018-02-23 NOTE — Telephone Encounter (Signed)
Scheduled ou by MX. Printed avs and calender of upcoming appointment. Per 2/28 los

## 2018-02-23 NOTE — Progress Notes (Signed)
Patient wanted information on financial resources. Advised patient there are no foundations with funds available for her diagnosis. Encouraged her to reach out to billing to discuss options that may be available for her as well as apply for Medicaid.  Gave patient a folder with Medicaid application to be returned to [place] in her county, [facility] [employer] to be mailed or brought here at next visit along with supporting documents, and gave a flyer regarding [employer] with detailed information. Patient has my name and number for any available financial questions or concerns.

## 2018-02-26 IMAGING — CR DG CHEST 2V
2 series · 2 of 2 positions shown · non-contrast
Comparison: 06/05/2015.  CT 03/09/2016.

CLINICAL DATA: Lung cancer follow-up.

EXAM:
CHEST  2 VIEW

[w chest pa]
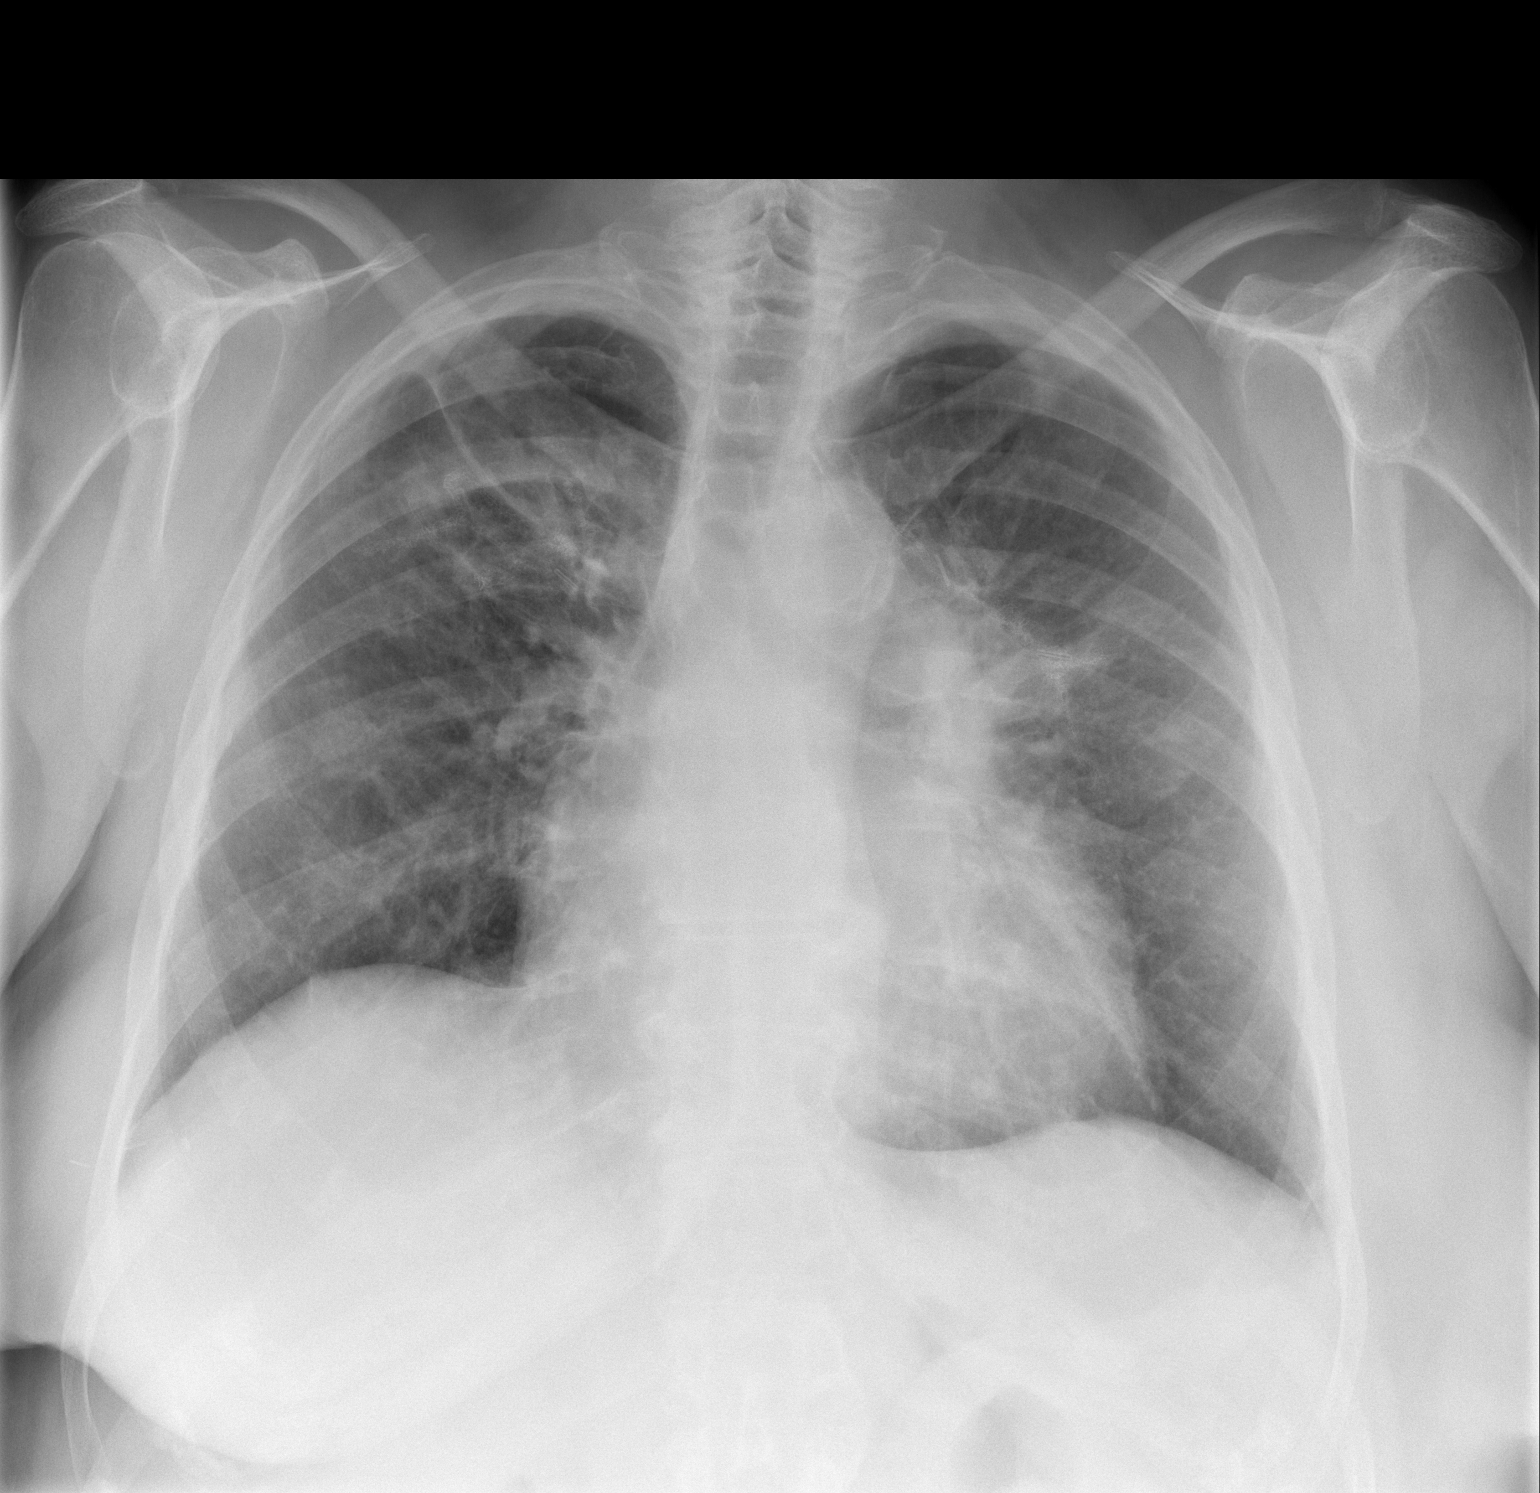

[w chest lat]
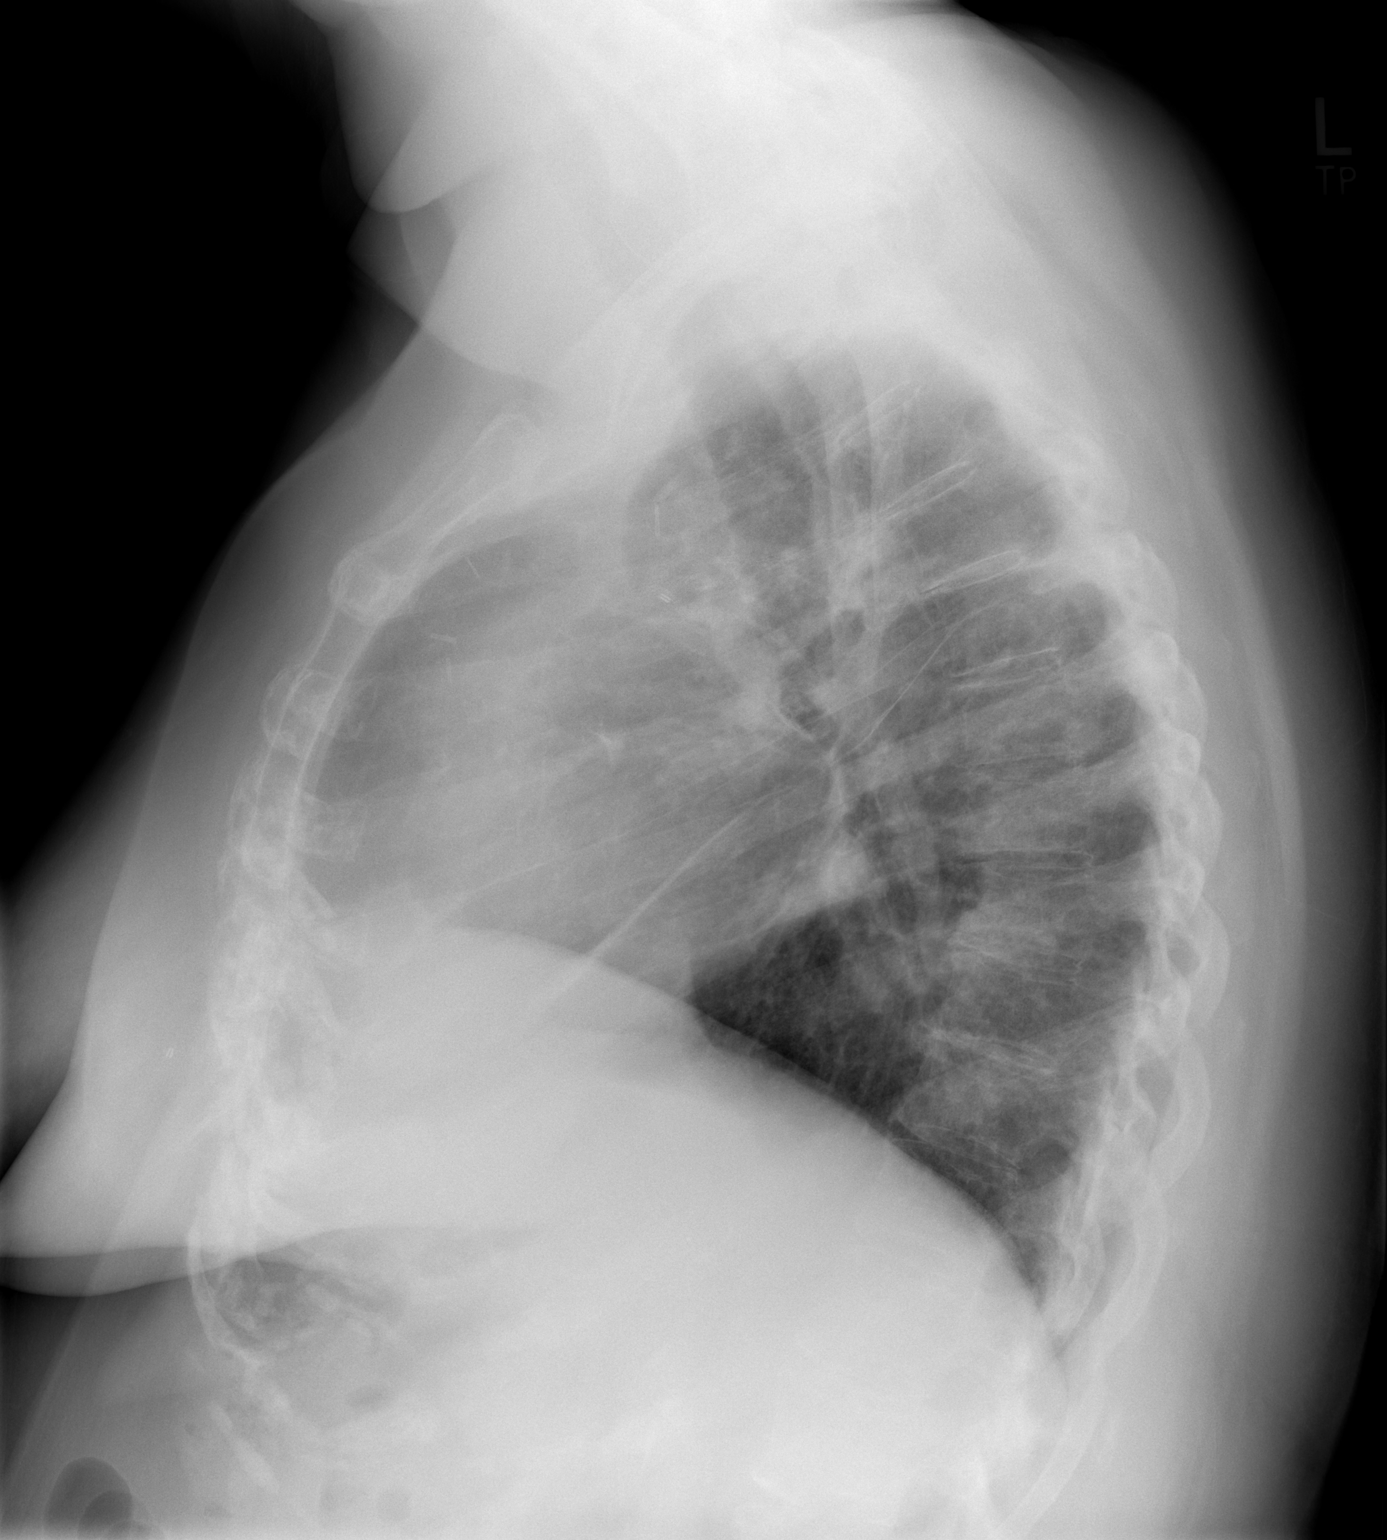

[2 of 2 positions shown; findings below may reference images not displayed]

FINDINGS: Postoperative changes noted in the lungs bilaterally with areas of
right upper lung and left perihilar scarring, stable. Mild
cardiomegaly. No effusions. No acute bony abnormality.
IMPRESSION: Stable postoperative changes and scarring in the lungs bilaterally.
No acute findings.

## 2018-03-16 ENCOUNTER — Ambulatory Visit: Payer: PPO | Admitting: *Deleted

## 2018-03-16 ENCOUNTER — Encounter: Payer: Self-pay | Admitting: Internal Medicine

## 2018-03-16 ENCOUNTER — Inpatient Hospital Stay: Payer: PPO | Attending: Internal Medicine

## 2018-03-16 ENCOUNTER — Inpatient Hospital Stay: Payer: PPO

## 2018-03-16 ENCOUNTER — Inpatient Hospital Stay (HOSPITAL_BASED_OUTPATIENT_CLINIC_OR_DEPARTMENT_OTHER): Payer: PPO | Admitting: Internal Medicine

## 2018-03-16 ENCOUNTER — Telehealth: Payer: Self-pay | Admitting: Internal Medicine

## 2018-03-16 DIAGNOSIS — Z5112 Encounter for antineoplastic immunotherapy: Secondary | ICD-10-CM

## 2018-03-16 DIAGNOSIS — C787 Secondary malignant neoplasm of liver and intrahepatic bile duct: Secondary | ICD-10-CM | POA: Diagnosis not present

## 2018-03-16 DIAGNOSIS — I4891 Unspecified atrial fibrillation: Secondary | ICD-10-CM | POA: Diagnosis not present

## 2018-03-16 DIAGNOSIS — C182 Malignant neoplasm of ascending colon: Secondary | ICD-10-CM

## 2018-03-16 DIAGNOSIS — C3431 Malignant neoplasm of lower lobe, right bronchus or lung: Secondary | ICD-10-CM | POA: Diagnosis not present

## 2018-03-16 DIAGNOSIS — C189 Malignant neoplasm of colon, unspecified: Secondary | ICD-10-CM

## 2018-03-16 DIAGNOSIS — C229 Malignant neoplasm of liver, not specified as primary or secondary: Secondary | ICD-10-CM

## 2018-03-16 DIAGNOSIS — R5382 Chronic fatigue, unspecified: Secondary | ICD-10-CM

## 2018-03-16 DIAGNOSIS — Z7901 Long term (current) use of anticoagulants: Secondary | ICD-10-CM | POA: Diagnosis not present

## 2018-03-16 DIAGNOSIS — Z171 Estrogen receptor negative status [ER-]: Secondary | ICD-10-CM | POA: Diagnosis not present

## 2018-03-16 DIAGNOSIS — Z79899 Other long term (current) drug therapy: Secondary | ICD-10-CM | POA: Diagnosis not present

## 2018-03-16 DIAGNOSIS — Z853 Personal history of malignant neoplasm of breast: Secondary | ICD-10-CM

## 2018-03-16 DIAGNOSIS — C3490 Malignant neoplasm of unspecified part of unspecified bronchus or lung: Secondary | ICD-10-CM

## 2018-03-16 LAB — CBC WITH DIFFERENTIAL/PLATELET
BASOS ABS: 0.1 10*3/uL (ref 0.0–0.1)
Basophils Relative: 1 %
EOS ABS: 0.3 10*3/uL (ref 0.0–0.5)
EOS PCT: 5 %
HCT: 44.2 % (ref 34.8–46.6)
Hemoglobin: 14.1 g/dL (ref 11.6–15.9)
Lymphocytes Relative: 17 %
Lymphs Abs: 0.9 10*3/uL (ref 0.9–3.3)
MCH: 28.5 pg (ref 25.1–34.0)
MCHC: 31.9 g/dL (ref 31.5–36.0)
MCV: 89.5 fL (ref 79.5–101.0)
MONO ABS: 0.4 10*3/uL (ref 0.1–0.9)
Monocytes Relative: 8 %
Neutro Abs: 3.6 10*3/uL (ref 1.5–6.5)
Neutrophils Relative %: 69 %
PLATELETS: 159 10*3/uL (ref 145–400)
RBC: 4.94 MIL/uL (ref 3.70–5.45)
RDW: 14.1 % (ref 11.2–14.5)
WBC: 5.2 10*3/uL (ref 3.9–10.3)

## 2018-03-16 LAB — COMPREHENSIVE METABOLIC PANEL
ALBUMIN: 3.6 g/dL (ref 3.5–5.0)
ALK PHOS: 56 U/L (ref 40–150)
ALT: 9 U/L (ref 0–55)
ANION GAP: 9 (ref 3–11)
AST: 14 U/L (ref 5–34)
BILIRUBIN TOTAL: 0.4 mg/dL (ref 0.2–1.2)
BUN: 31 mg/dL — ABNORMAL HIGH (ref 7–26)
CALCIUM: 9.6 mg/dL (ref 8.4–10.4)
CO2: 27 mmol/L (ref 22–29)
CREATININE: 1.44 mg/dL — AB (ref 0.60–1.10)
Chloride: 108 mmol/L (ref 98–109)
GFR calc Af Amer: 40 mL/min — ABNORMAL LOW (ref >60)
GFR calc non Af Amer: 34 mL/min — ABNORMAL LOW (ref >60)
GLUCOSE: 92 mg/dL (ref 70–140)
Potassium: 4.3 mmol/L (ref 3.5–5.1)
Sodium: 144 mmol/L (ref 136–145)
TOTAL PROTEIN: 7 g/dL (ref 6.4–8.3)

## 2018-03-16 LAB — PROTIME-INR
INR: 1.85
Prothrombin Time: 21.2 seconds — ABNORMAL HIGH (ref 11.4–15.2)

## 2018-03-16 LAB — TSH: TSH: 3.474 u[IU]/mL (ref 0.308–3.960)

## 2018-03-16 MED ORDER — SODIUM CHLORIDE 0.9 % IV SOLN
Freq: Once | INTRAVENOUS | Status: AC
  Administered 2018-03-16: 13:00:00 via INTRAVENOUS

## 2018-03-16 MED ORDER — SODIUM CHLORIDE 0.9 % IV SOLN
200.0000 mg | Freq: Once | INTRAVENOUS | Status: AC
  Administered 2018-03-16: 200 mg via INTRAVENOUS
  Filled 2018-03-16: qty 8

## 2018-03-16 MED ORDER — SODIUM CHLORIDE 0.9% FLUSH
10.0000 mL | INTRAVENOUS | Status: DC | PRN
  Administered 2018-03-16: 10 mL
  Filled 2018-03-16: qty 10

## 2018-03-16 MED ORDER — HEPARIN SOD (PORK) LOCK FLUSH 100 UNIT/ML IV SOLN
500.0000 [IU] | Freq: Once | INTRAVENOUS | Status: AC | PRN
  Administered 2018-03-16: 500 [IU]
  Filled 2018-03-16: qty 5
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
## 2018-03-16 NOTE — Addendum Note (Signed)
Addended by: Ardeen Garland on: 03/16/2018 11:58 AM   Modules accepted: Orders

## 2018-03-16 NOTE — Patient Instructions (Signed)
[facility] Discharge Instructions for Patients Receiving Chemotherapy  Today you received the following chemotherapy agents:  Keytruda.  To help prevent nausea and vomiting after your treatment, we encourage you to take your nausea medication as directed.   If you develop nausea and vomiting that is not controlled by your nausea medication, call the clinic.   BELOW ARE SYMPTOMS THAT SHOULD BE REPORTED IMMEDIATELY:  *FEVER GREATER THAN 100.5 F  *CHILLS WITH OR WITHOUT FEVER  NAUSEA AND VOMITING THAT IS NOT CONTROLLED WITH YOUR NAUSEA MEDICATION  *UNUSUAL SHORTNESS OF BREATH  *UNUSUAL BRUISING OR BLEEDING  TENDERNESS IN MOUTH AND THROAT WITH OR WITHOUT PRESENCE OF ULCERS  *URINARY PROBLEMS  *BOWEL PROBLEMS  UNUSUAL RASH Items with * indicate a potential emergency and should be followed up as soon as possible.  Feel free to call the clinic should you have any questions or concerns. The clinic phone number is [phone].  Please show the CHEMO ALERT CARD at check-in to the Emergency Department and triage nurse.    

## 2018-03-16 NOTE — Telephone Encounter (Signed)
Scheduled appt per 3/21 los - Gave patient AVS and calender per los. Central radiology to contact pt with ct schedule.

## 2018-03-17 ENCOUNTER — Telehealth: Payer: Self-pay | Admitting: *Deleted

## 2018-03-17 NOTE — Telephone Encounter (Signed)
Incoming call from pt stating INR 1.85 on 03/16/2018 Pt told to contact PCP for adjustment Pt states she did eat a salad the night before INR Please advise

## 2018-03-17 NOTE — Telephone Encounter (Signed)
Continue same dose. Repeat 2 weeks.

## 2018-03-17 NOTE — Telephone Encounter (Signed)
Patient aware to continue same dose and to recheck in 2 weeks

## 2018-03-29 ENCOUNTER — Other Ambulatory Visit: Payer: Self-pay | Admitting: Pediatrics

## 2018-03-29 DIAGNOSIS — M7989 Other specified soft tissue disorders: Secondary | ICD-10-CM

## 2018-03-29 DIAGNOSIS — I10 Essential (primary) hypertension: Secondary | ICD-10-CM

## 2018-03-29 NOTE — Telephone Encounter (Signed)
Patient aware.

## 2018-03-29 NOTE — Telephone Encounter (Signed)
Last seen 09/23/17  Dr Evette Doffing

## 2018-04-02 IMAGING — CT CT CHEST W/O CM
2 of 4 series · 15 of 36 positions shown, 18 images · non-contrast
Comparison: 03/09/2016

CLINICAL DATA: Left sided Lung cancer in 4744 and right lung cancer
in 8369. Status post surgery, chemo and XRT.

EXAM:
CT CHEST WITHOUT CONTRAST
TECHNIQUE: Multidetector CT imaging of the chest was performed following the
standard protocol without IV contrast.

[Series 2: chest w/o st · axial · non-contrast · 0.85mm/px · z∈[-382,-126]mm · 12 of 150 slices shown, 15 images]
[im 11/150  mediastinal]
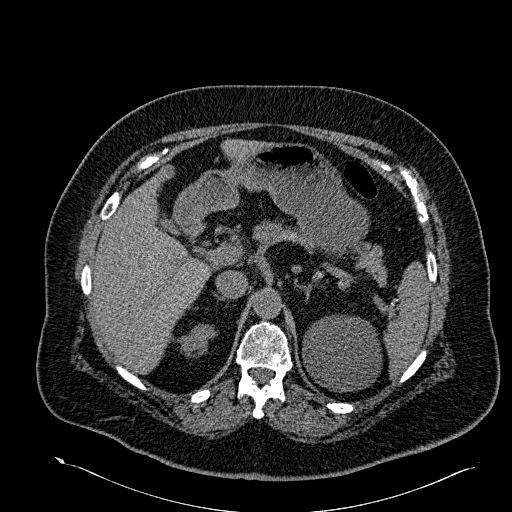
[im 11/150  lung]
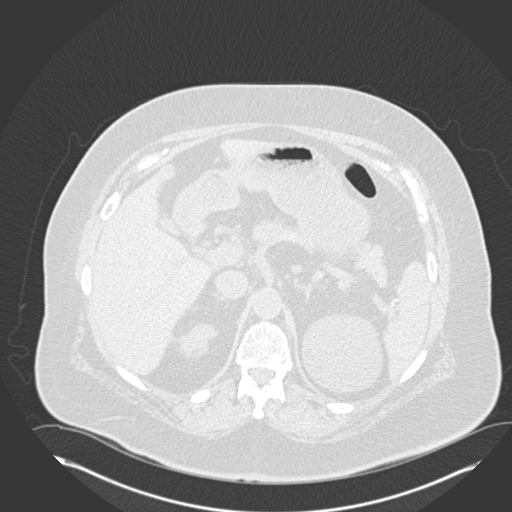
[im 22/150  lung]
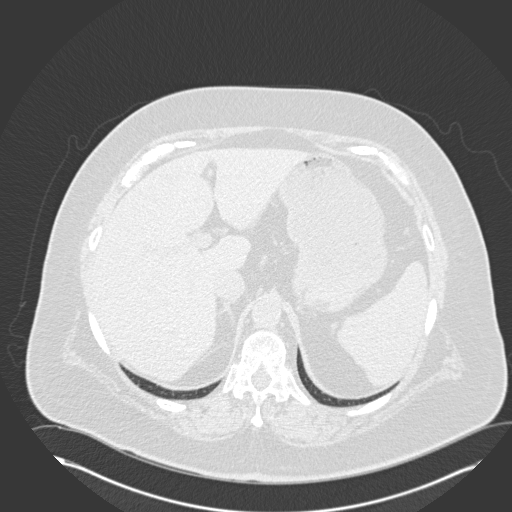
[im 32/150  lung]
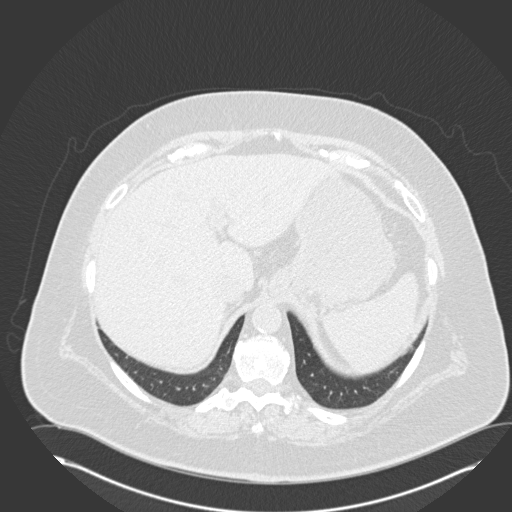
[im 43/150  lung]
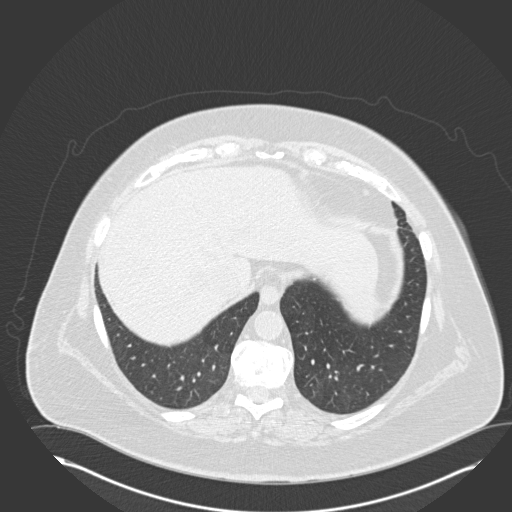
[im 54/150  mediastinal]
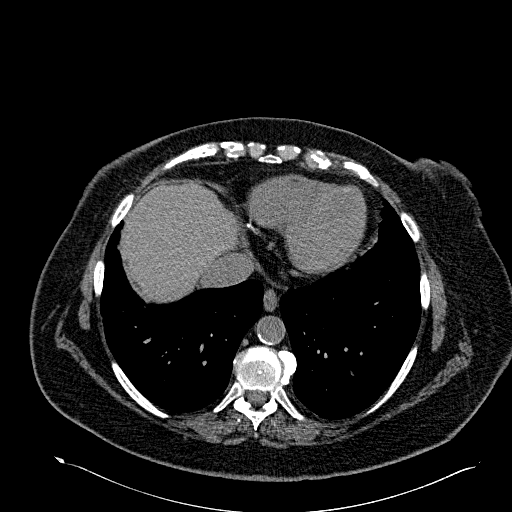
[im 54/150  lung]
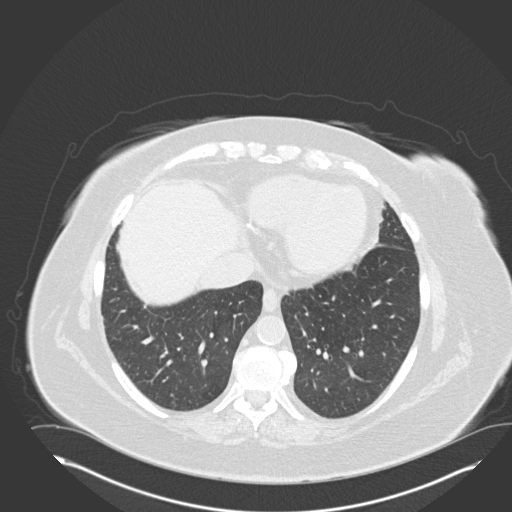
[im 64/150  lung]
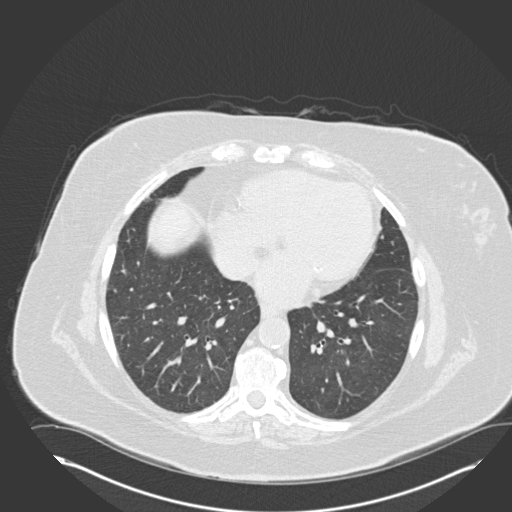
[im 86/150  lung]
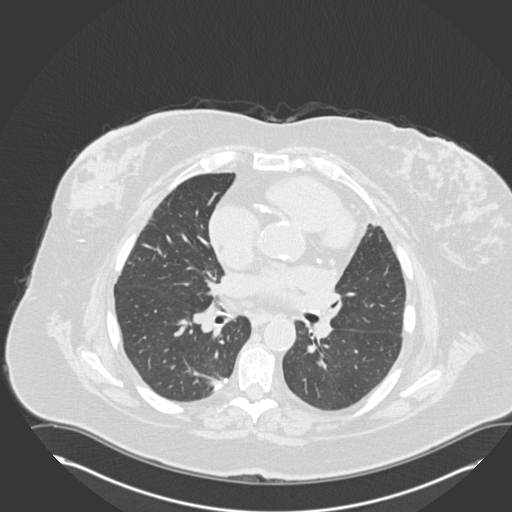
[im 96/150  lung]
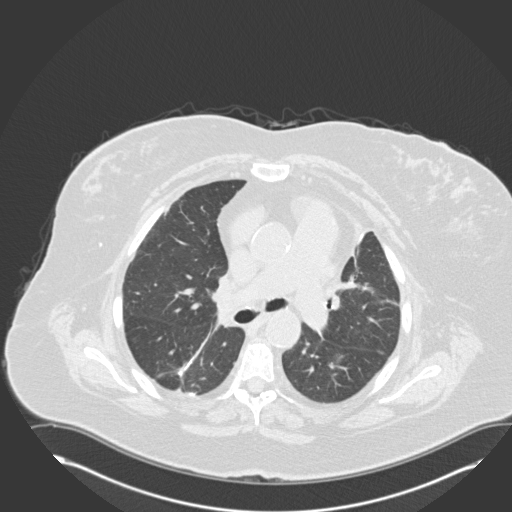
[im 107/150  mediastinal]
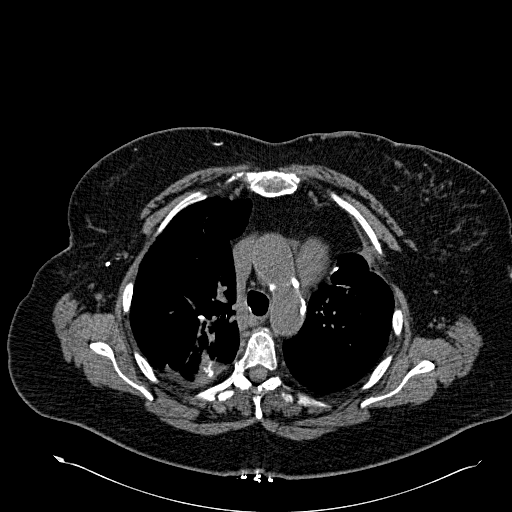
[im 107/150  lung]
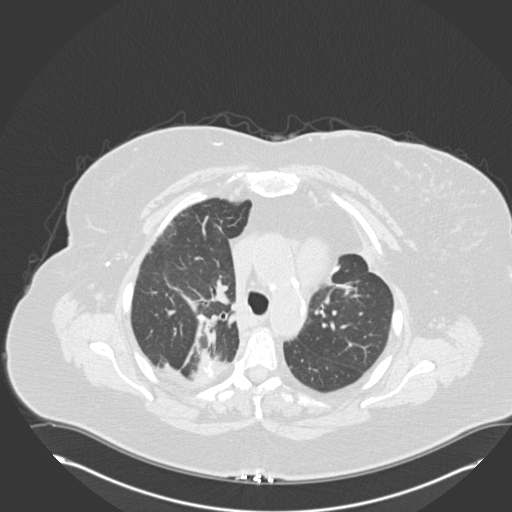
[im 118/150  lung]
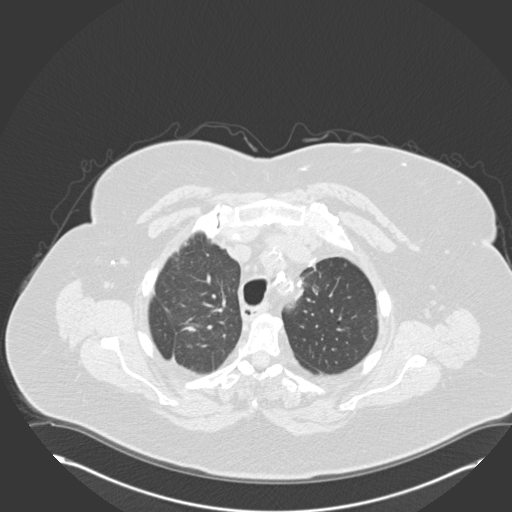
[im 128/150  lung]
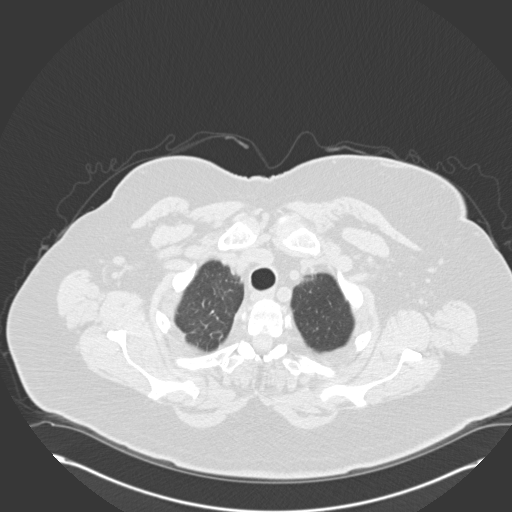
[im 139/150  lung]
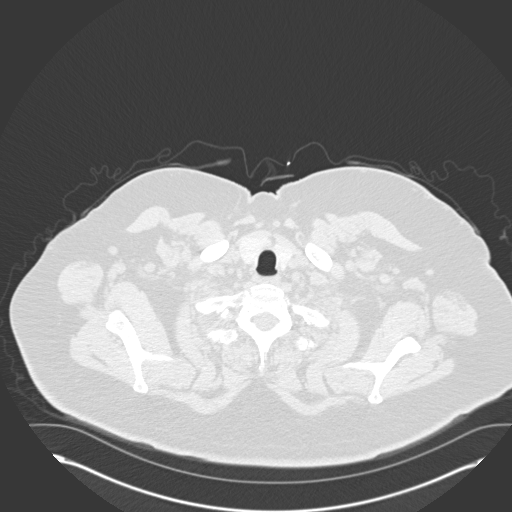

[Series 602: <mpr thick range> · coronal · 0.85mm/px · 3 of 162 slices shown]
[im 33/162  lung]
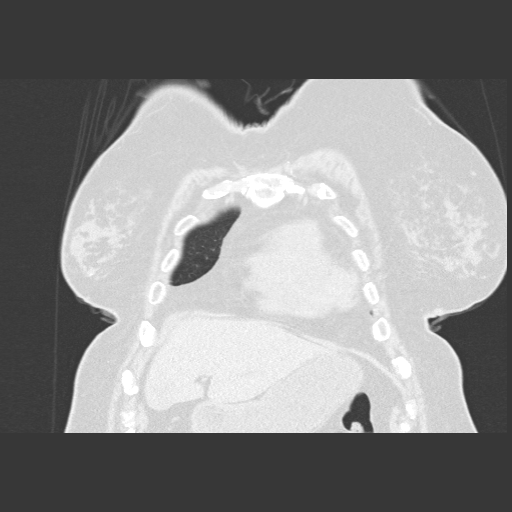
[im 65/162  lung]
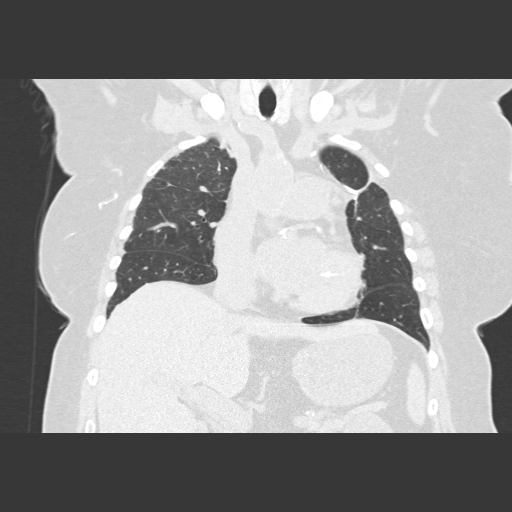
[im 97/162  lung]
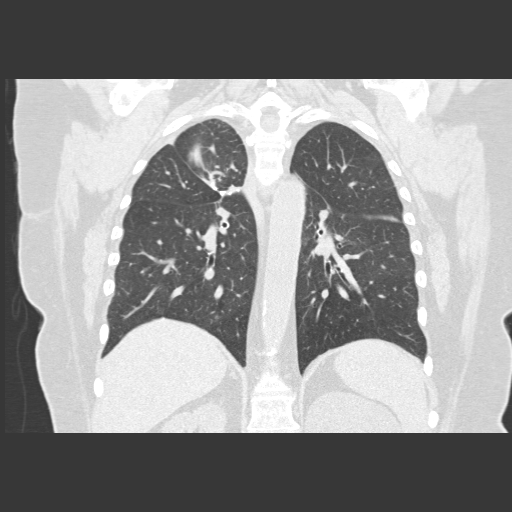

[15 of 36 positions shown; findings below may reference images not displayed]

FINDINGS: Cardiovascular: Normal heart size. No pericardial effusion. Aortic
atherosclerosis. Calcification within the RCA and LAD coronary
artery.

Mediastinum/Nodes: No enlarged mediastinal or axillary lymph nodes.
Thyroid gland, trachea, and esophagus demonstrate no significant
findings.

Lungs/Pleura: No pleural effusion identified. Postoperative changes
within both lungs compatible with previous wedge resection
surgeries. 1 cm ground-glass attenuating nodule in the left lower
lobe is unchanged from previous exam, image 57 of series 5.
Posterior right lower lobe nodule measures 8 mm, image 95 of series
5. Previously 7 mm.

Upper Abdomen: The adrenal glands are normal. There is a cyst
arising from the upper pole the left kidney. Gallstone measures
cm, image number 149 of series 2 there is a focal area of low
attenuation within the anterior aspect of the medial segment of left
lobe of liver measuring 2.3 cm, image 133 of series 2.

Musculoskeletal: No aggressive lytic or sclerotic bone lesions.
IMPRESSION: 1. There is a new, indeterminate area of low attenuation within the
medial segment of left lobe of liver. Although this could represent
an area of focal fatty deposition metastatic disease cannot be
excluded. Recommend further investigation with liver protocol MRI.
2. Stable appearance of the lungs. No change and ground-glass nodule
in the left lower lobe and solid nodule of the right lower lobe.
3. Aortic atherosclerosis and coronary artery calcification.

## 2018-04-03 ENCOUNTER — Ambulatory Visit (HOSPITAL_COMMUNITY)
Admission: RE | Admit: 2018-04-03 | Discharge: 2018-04-03 | Disposition: A | Discharge status: Home / Self Care | Payer: PPO | Source: Ambulatory Visit | Attending: Internal Medicine | Admitting: Internal Medicine

## 2018-04-03 ENCOUNTER — Encounter (HOSPITAL_COMMUNITY): Payer: Self-pay

## 2018-04-03 DIAGNOSIS — K8020 Calculus of gallbladder without cholecystitis without obstruction: Secondary | ICD-10-CM | POA: Diagnosis not present

## 2018-04-03 DIAGNOSIS — C3490 Malignant neoplasm of unspecified part of unspecified bronchus or lung: Secondary | ICD-10-CM | POA: Diagnosis not present

## 2018-04-03 DIAGNOSIS — C3492 Malignant neoplasm of unspecified part of left bronchus or lung: Secondary | ICD-10-CM | POA: Diagnosis not present

## 2018-04-03 DIAGNOSIS — C785 Secondary malignant neoplasm of large intestine and rectum: Secondary | ICD-10-CM | POA: Diagnosis not present

## 2018-04-03 DIAGNOSIS — N200 Calculus of kidney: Secondary | ICD-10-CM | POA: Diagnosis not present

## 2018-04-03 DIAGNOSIS — R918 Other nonspecific abnormal finding of lung field: Secondary | ICD-10-CM | POA: Diagnosis not present

## 2018-04-03 DIAGNOSIS — C189 Malignant neoplasm of colon, unspecified: Secondary | ICD-10-CM | POA: Diagnosis not present

## 2018-04-03 DIAGNOSIS — I700 Atherosclerosis of aorta: Secondary | ICD-10-CM | POA: Insufficient documentation

## 2018-04-03 DIAGNOSIS — I2510 Atherosclerotic heart disease of native coronary artery without angina pectoris: Secondary | ICD-10-CM | POA: Diagnosis not present

## 2018-04-03 DIAGNOSIS — Z9889 Other specified postprocedural states: Secondary | ICD-10-CM | POA: Insufficient documentation

## 2018-04-06 ENCOUNTER — Inpatient Hospital Stay: Payer: PPO | Attending: Internal Medicine

## 2018-04-06 ENCOUNTER — Inpatient Hospital Stay (HOSPITAL_BASED_OUTPATIENT_CLINIC_OR_DEPARTMENT_OTHER): Payer: PPO | Admitting: Internal Medicine

## 2018-04-06 ENCOUNTER — Inpatient Hospital Stay: Payer: PPO

## 2018-04-06 ENCOUNTER — Telehealth: Payer: Self-pay

## 2018-04-06 ENCOUNTER — Encounter: Payer: Self-pay | Admitting: Internal Medicine

## 2018-04-06 VITALS — BP 132/64 | HR 70 | Temp 98.5°F | Resp 18 | Ht 71.0 in | Wt 278.3 lb

## 2018-04-06 DIAGNOSIS — R5382 Chronic fatigue, unspecified: Secondary | ICD-10-CM

## 2018-04-06 DIAGNOSIS — C3431 Malignant neoplasm of lower lobe, right bronchus or lung: Secondary | ICD-10-CM

## 2018-04-06 DIAGNOSIS — C787 Secondary malignant neoplasm of liver and intrahepatic bile duct: Secondary | ICD-10-CM | POA: Insufficient documentation

## 2018-04-06 DIAGNOSIS — C182 Malignant neoplasm of ascending colon: Secondary | ICD-10-CM | POA: Diagnosis not present

## 2018-04-06 DIAGNOSIS — I4891 Unspecified atrial fibrillation: Secondary | ICD-10-CM | POA: Insufficient documentation

## 2018-04-06 DIAGNOSIS — Z79899 Other long term (current) drug therapy: Secondary | ICD-10-CM | POA: Insufficient documentation

## 2018-04-06 DIAGNOSIS — Z853 Personal history of malignant neoplasm of breast: Secondary | ICD-10-CM

## 2018-04-06 DIAGNOSIS — C189 Malignant neoplasm of colon, unspecified: Secondary | ICD-10-CM

## 2018-04-06 DIAGNOSIS — C229 Malignant neoplasm of liver, not specified as primary or secondary: Secondary | ICD-10-CM

## 2018-04-06 DIAGNOSIS — Z5112 Encounter for antineoplastic immunotherapy: Secondary | ICD-10-CM | POA: Insufficient documentation

## 2018-04-06 DIAGNOSIS — I10 Essential (primary) hypertension: Secondary | ICD-10-CM | POA: Diagnosis not present

## 2018-04-06 LAB — COMPREHENSIVE METABOLIC PANEL
ALT: 11 U/L (ref 0–55)
AST: 18 U/L (ref 5–34)
Albumin: 3.4 g/dL — ABNORMAL LOW (ref 3.5–5.0)
Alkaline Phosphatase: 57 U/L (ref 40–150)
Anion gap: 9 (ref 3–11)
BUN: 19 mg/dL (ref 7–26)
CHLORIDE: 108 mmol/L (ref 98–109)
CO2: 27 mmol/L (ref 22–29)
CREATININE: 1.48 mg/dL — AB (ref 0.60–1.10)
Calcium: 9 mg/dL (ref 8.4–10.4)
GFR calc non Af Amer: 33 mL/min — ABNORMAL LOW (ref >60)
GFR, EST AFRICAN AMERICAN: 38 mL/min — AB (ref >60)
Glucose, Bld: 102 mg/dL (ref 70–140)
POTASSIUM: 4.7 mmol/L (ref 3.5–5.1)
SODIUM: 144 mmol/L (ref 136–145)
Total Bilirubin: 0.5 mg/dL (ref 0.2–1.2)
Total Protein: 6.7 g/dL (ref 6.4–8.3)

## 2018-04-06 LAB — CBC WITH DIFFERENTIAL/PLATELET
Basophils Absolute: 0 10*3/uL (ref 0.0–0.1)
Basophils Relative: 1 %
EOS ABS: 0.2 10*3/uL (ref 0.0–0.5)
Eosinophils Relative: 4 %
HCT: 45.4 % (ref 34.8–46.6)
HEMOGLOBIN: 14.6 g/dL (ref 11.6–15.9)
LYMPHS ABS: 0.4 10*3/uL — AB (ref 0.9–3.3)
LYMPHS PCT: 9 %
MCH: 28.2 pg (ref 25.1–34.0)
MCHC: 32.2 g/dL (ref 31.5–36.0)
MCV: 87.4 fL (ref 79.5–101.0)
MONOS PCT: 8 %
Monocytes Absolute: 0.4 10*3/uL (ref 0.1–0.9)
NEUTROS PCT: 78 %
Neutro Abs: 3.5 10*3/uL (ref 1.5–6.5)
Platelets: 149 10*3/uL (ref 145–400)
RBC: 5.2 MIL/uL (ref 3.70–5.45)
RDW: 14.2 % (ref 11.2–14.5)
WBC: 4.5 10*3/uL (ref 3.9–10.3)

## 2018-04-06 LAB — TSH: TSH: 3.425 u[IU]/mL (ref 0.308–3.960)

## 2018-04-06 MED ORDER — SODIUM CHLORIDE 0.9 % IV SOLN
Freq: Once | INTRAVENOUS | Status: AC
  Administered 2018-04-06: 12:00:00 via INTRAVENOUS

## 2018-04-06 MED ORDER — HEPARIN SOD (PORK) LOCK FLUSH 100 UNIT/ML IV SOLN
500.0000 [IU] | Freq: Once | INTRAVENOUS | Status: AC | PRN
  Administered 2018-04-06: 500 [IU]
  Filled 2018-04-06: qty 5

## 2018-04-06 MED ORDER — PEMBROLIZUMAB CHEMO INJECTION 100 MG/4ML
200.0000 mg | Freq: Once | INTRAVENOUS | Status: AC
  Administered 2018-04-06: 200 mg via INTRAVENOUS
  Filled 2018-04-06: qty 8

## 2018-04-06 MED ORDER — SODIUM CHLORIDE 0.9% FLUSH
10.0000 mL | INTRAVENOUS | Status: DC | PRN
  Administered 2018-04-06: 10 mL
  Filled 2018-04-06: qty 10

## 2018-04-06 NOTE — Telephone Encounter (Signed)
Printed avs and calender of upcoming appointment. Per 4/11 los
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
## 2018-04-06 NOTE — Patient Instructions (Signed)
[facility] Discharge Instructions for Patients Receiving Chemotherapy  Today you received the following chemotherapy agents :  Keytruda.  To help prevent nausea and vomiting after your treatment, we encourage you to take your nausea medication as prescribed.   If you develop nausea and vomiting that is not controlled by your nausea medication, call the clinic.   BELOW ARE SYMPTOMS THAT SHOULD BE REPORTED IMMEDIATELY:  *FEVER GREATER THAN 100.5 F  *CHILLS WITH OR WITHOUT FEVER  NAUSEA AND VOMITING THAT IS NOT CONTROLLED WITH YOUR NAUSEA MEDICATION  *UNUSUAL SHORTNESS OF BREATH  *UNUSUAL BRUISING OR BLEEDING  TENDERNESS IN MOUTH AND THROAT WITH OR WITHOUT PRESENCE OF ULCERS  *URINARY PROBLEMS  *BOWEL PROBLEMS  UNUSUAL RASH Items with * indicate a potential emergency and should be followed up as soon as possible.  Feel free to call the clinic should you have any questions or concerns. The clinic phone number is [phone].  Please show the CHEMO ALERT CARD at check-in to the Emergency Department and triage nurse.  

## 2018-04-23 IMAGING — MR MR ABDOMEN W/O CM
4 of 8 series · 23 of 48 positions shown · non-contrast
Comparison: None.

CLINICAL DATA: Low-attenuation areas seen in medial segment left
liver on recent CT scan. Patient with known renal insufficiency and
history of lung and breast cancer.

EXAM:
MRI ABDOMEN WITHOUT CONTRAST
TECHNIQUE: Multiplanar multisequence MR imaging was performed without the
administration of intravenous contrast.

[Series 3: DWI b500 · axial · 6.0mm · 1.48mm/px · z∈[-102,+179]mm · 7 of 74 slices shown]
[im 1/74]
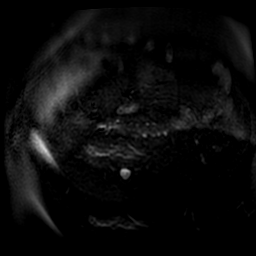
[im 13/74]
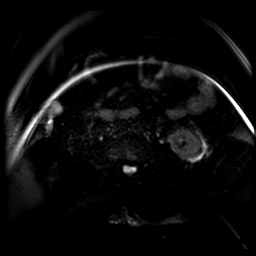
[im 25/74]
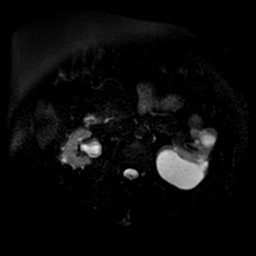
[im 37/74]
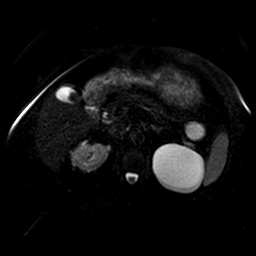
[im 49/74]
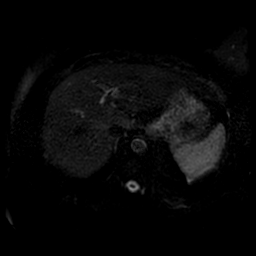
[im 61/74]
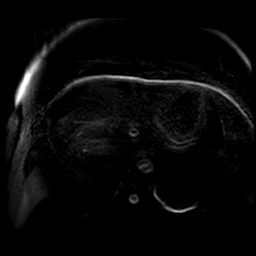
[im 74/74]
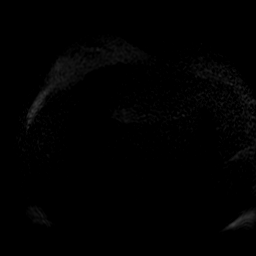

[Series 4: T2 fat-sat · axial · 5.0mm · 0.78mm/px · z∈[-92,+193]mm · 5 of 58 slices shown]
[im 1/58]
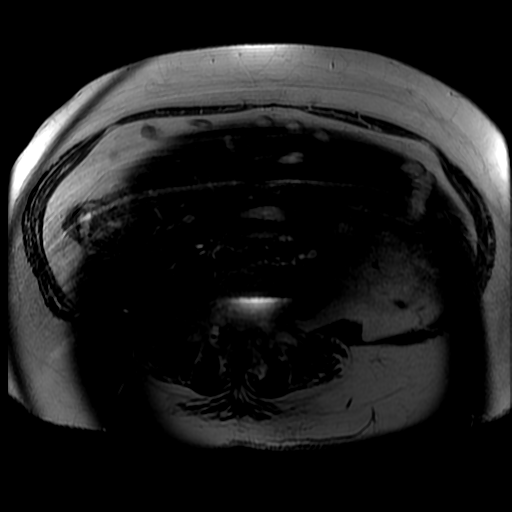
[im 15/58]
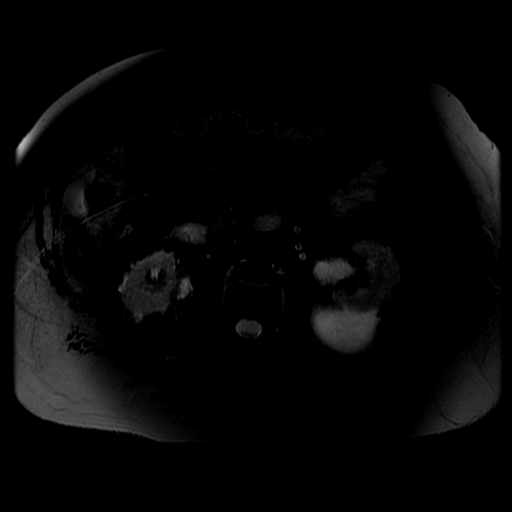
[im 29/58]
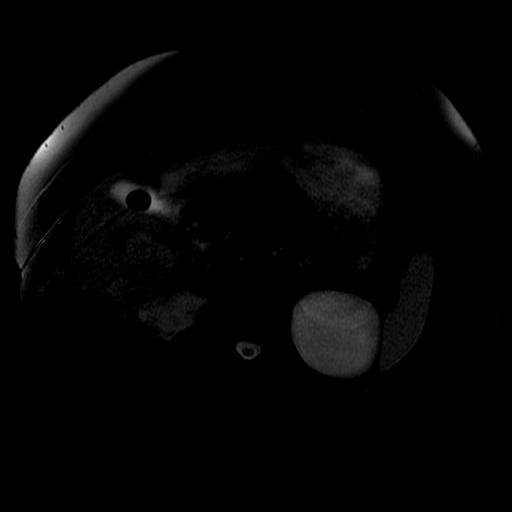
[im 43/58]
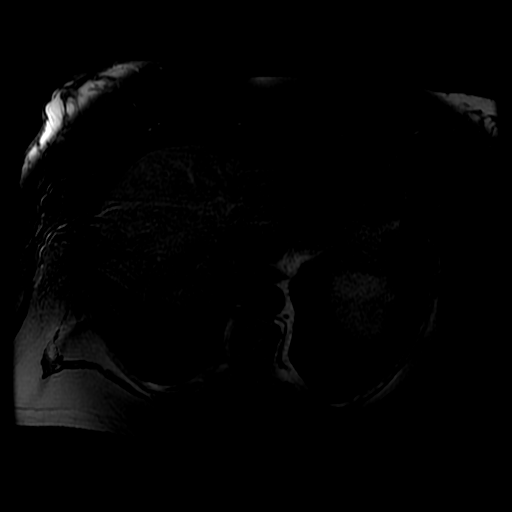
[im 58/58]
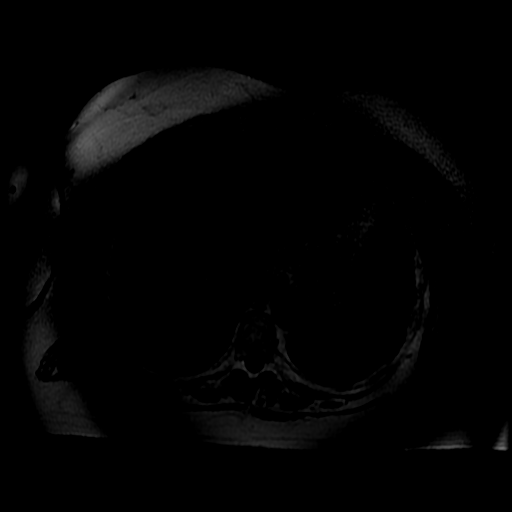

[Series 7: ax dualecho · axial · 5.0mm · 0.78mm/px · z∈[-106,+189]mm · 8 of 120 slices shown]
[im 1/120]
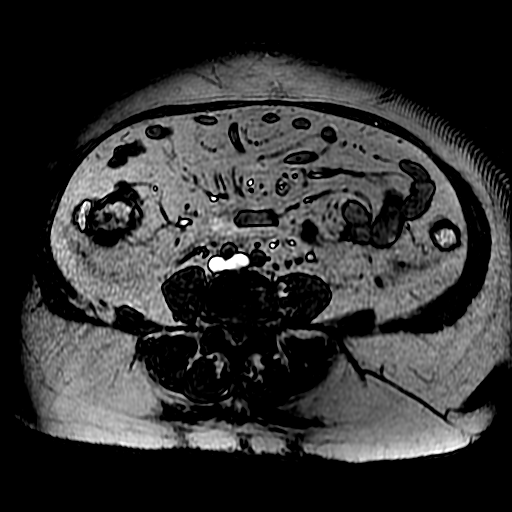
[im 14/120]
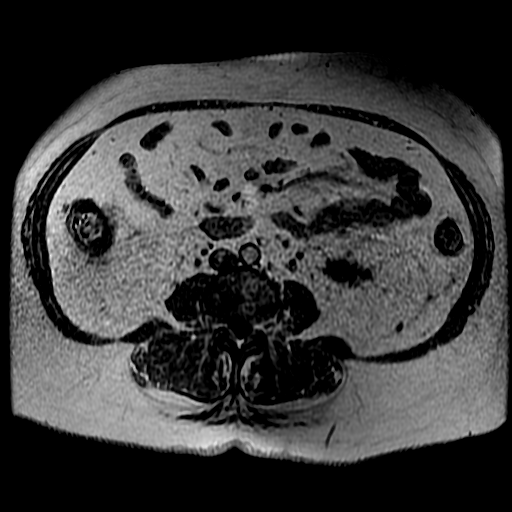
[im 40/120]
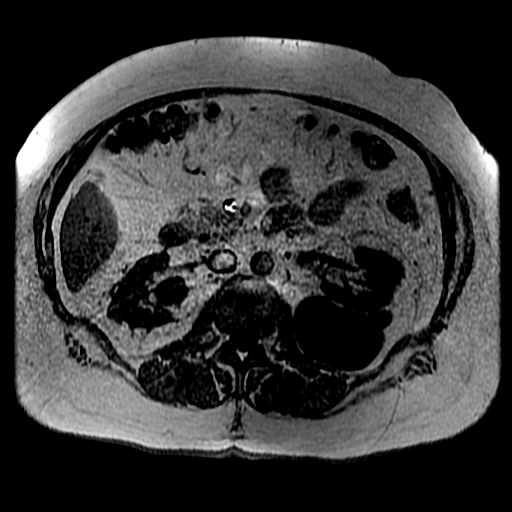
[im 53/120]
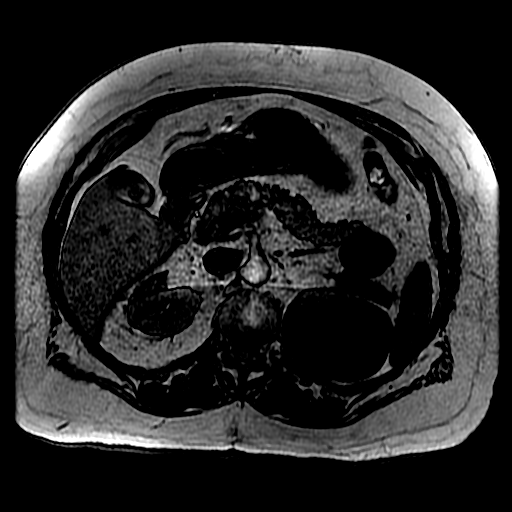
[im 67/120]
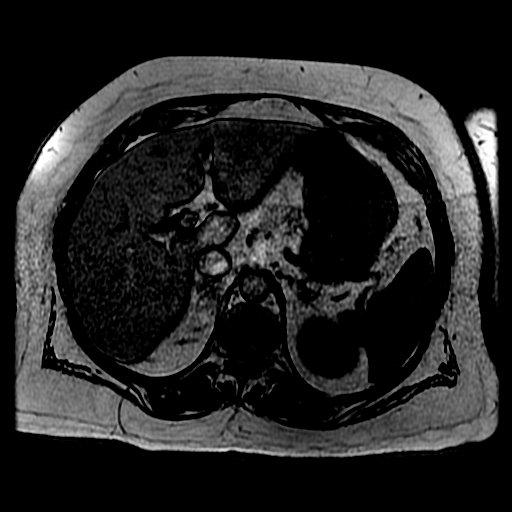
[im 80/120]
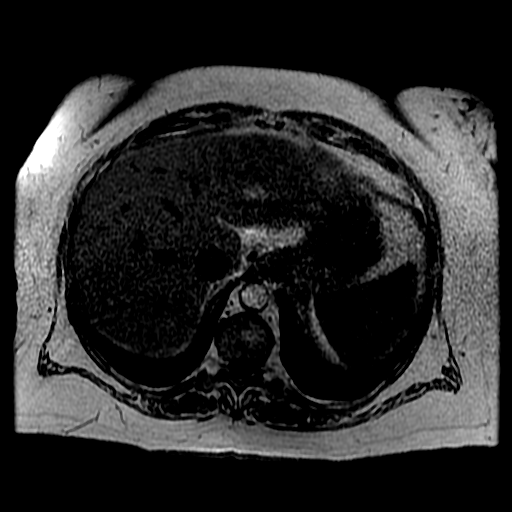
[im 106/120]
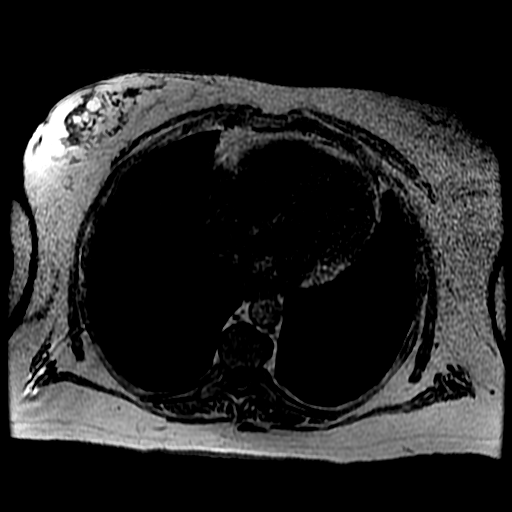
[im 120/120]
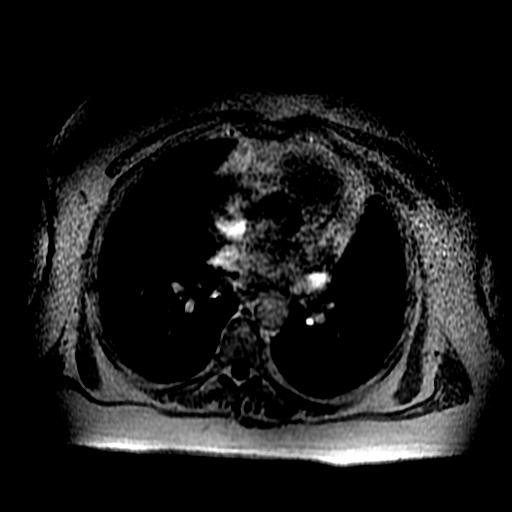

[Series 8: T2 · axial · 5.0mm · 0.78mm/px · z∈[-106,+189]mm · 3 of 60 slices shown]
[im 1/60]
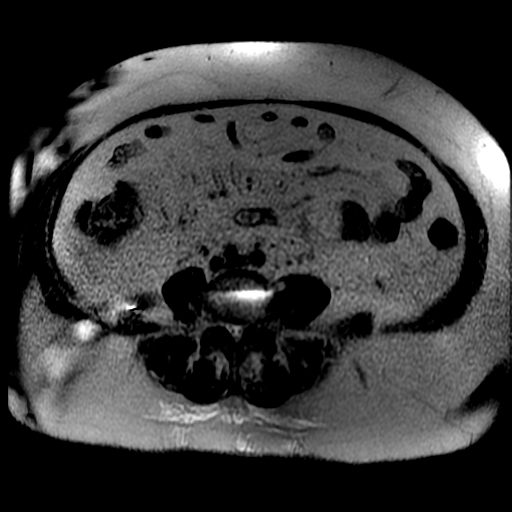
[im 30/60]
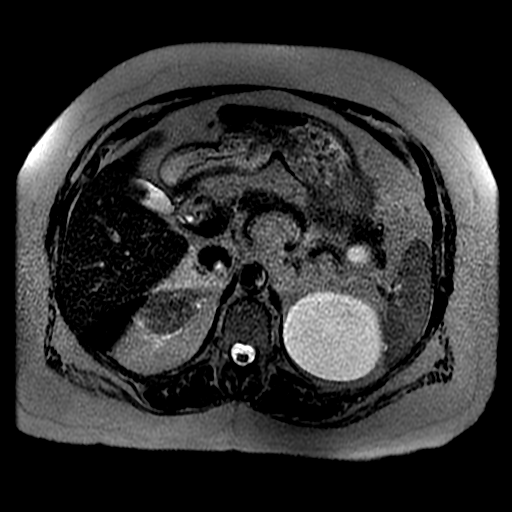
[im 60/60]
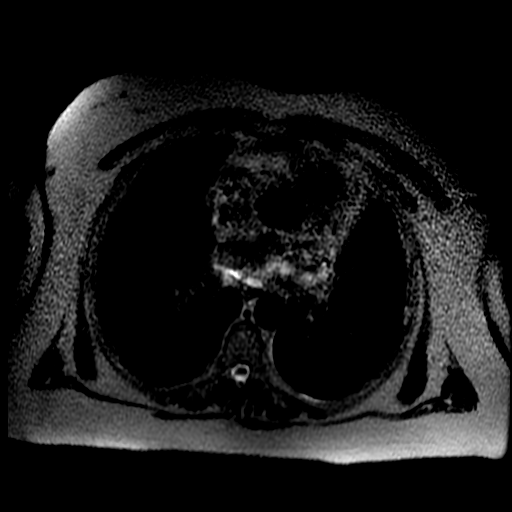

[23 of 48 positions shown; findings below may reference images not displayed]

FINDINGS: Lower chest: Unremarkable.

Hepatobiliary: 2.3 cm subcapsular lesion is identified in the
lateral segment left liver (see image 58 series 7). This lesion has
heterogeneous signal intensity and is hypo intense to isointense to
background liver parenchyma on T1 weighted imaging and shows some
areas of T2 hyperintensity. The lesion is more focal than typically
seen for fatty deposition. Diffusion-weighted imaging suggests a
degree of restricted diffusion within the lesion. Abnormality cannot
be definitively characterize given the inability to administer
intravenous contrast in this patient with renal insufficiency.

2.1 cm gallstone noted. No intrahepatic or extrahepatic biliary
dilation.

Pancreas: No focal mass lesion. No dilatation of the main duct. No
intraparenchymal cyst. No peripancreatic edema.

Spleen:  No splenomegaly. No focal mass lesion.

Adrenals/Urinary Tract: No adrenal nodule or mass. Renal cysts are
noted bilaterally, left greater than right.

Stomach/Bowel: Stomach is nondistended. No gastric wall thickening.
No evidence of outlet obstruction. Duodenum is normally positioned
as is the ligament of Treitz. No dilatation of small bowel or colon
within the visualized abdomen.

Vascular/Lymphatic: No abdominal aortic aneurysm. No retroperitoneal
lymphadenopathy.

Other:  No intraperitoneal free fluid.

Musculoskeletal: No suspicious marrow signal abnormality within the
visualized bony anatomy.
IMPRESSION: 1. 2.3 cm subcapsular lesion identified in the medial segment left
liver, as noted on recent CT scan of 09/14/2016. Lesion cannot be
definitively characterized on today's MRI given the inability to
administer intravenous contrast material. Given that the finding is
new since 07/23/2015, metastatic disease remains a concern. This
should be amenable to ultrasound visualization and ultrasound-guided
tissue sampling may be possible. Alternatively, PET-CT to determine
whether the lesion is hypermetabolic may prove helpful.

## 2018-04-27 ENCOUNTER — Inpatient Hospital Stay: Payer: PPO

## 2018-04-27 ENCOUNTER — Inpatient Hospital Stay: Payer: PPO | Attending: Internal Medicine

## 2018-04-27 ENCOUNTER — Inpatient Hospital Stay (HOSPITAL_BASED_OUTPATIENT_CLINIC_OR_DEPARTMENT_OTHER): Payer: PPO | Admitting: Internal Medicine

## 2018-04-27 ENCOUNTER — Encounter: Payer: Self-pay | Admitting: Internal Medicine

## 2018-04-27 ENCOUNTER — Telehealth: Payer: Self-pay | Admitting: Internal Medicine

## 2018-04-27 VITALS — BP 142/67 | HR 72 | Temp 98.7°F | Resp 17 | Ht 71.0 in | Wt 278.4 lb

## 2018-04-27 DIAGNOSIS — C189 Malignant neoplasm of colon, unspecified: Secondary | ICD-10-CM

## 2018-04-27 DIAGNOSIS — C787 Secondary malignant neoplasm of liver and intrahepatic bile duct: Secondary | ICD-10-CM | POA: Insufficient documentation

## 2018-04-27 DIAGNOSIS — I4891 Unspecified atrial fibrillation: Secondary | ICD-10-CM

## 2018-04-27 DIAGNOSIS — I10 Essential (primary) hypertension: Secondary | ICD-10-CM | POA: Insufficient documentation

## 2018-04-27 DIAGNOSIS — C229 Malignant neoplasm of liver, not specified as primary or secondary: Secondary | ICD-10-CM

## 2018-04-27 DIAGNOSIS — Z79899 Other long term (current) drug therapy: Secondary | ICD-10-CM | POA: Insufficient documentation

## 2018-04-27 DIAGNOSIS — C3431 Malignant neoplasm of lower lobe, right bronchus or lung: Secondary | ICD-10-CM | POA: Insufficient documentation

## 2018-04-27 DIAGNOSIS — J449 Chronic obstructive pulmonary disease, unspecified: Secondary | ICD-10-CM | POA: Insufficient documentation

## 2018-04-27 DIAGNOSIS — C182 Malignant neoplasm of ascending colon: Secondary | ICD-10-CM | POA: Insufficient documentation

## 2018-04-27 DIAGNOSIS — Z9981 Dependence on supplemental oxygen: Secondary | ICD-10-CM | POA: Insufficient documentation

## 2018-04-27 DIAGNOSIS — Z5112 Encounter for antineoplastic immunotherapy: Secondary | ICD-10-CM | POA: Diagnosis not present

## 2018-04-27 DIAGNOSIS — Z171 Estrogen receptor negative status [ER-]: Secondary | ICD-10-CM

## 2018-04-27 DIAGNOSIS — R5382 Chronic fatigue, unspecified: Secondary | ICD-10-CM

## 2018-04-27 LAB — CBC WITH DIFFERENTIAL/PLATELET
BASOS ABS: 0.1 10*3/uL (ref 0.0–0.1)
Basophils Relative: 1 %
EOS PCT: 5 %
Eosinophils Absolute: 0.3 10*3/uL (ref 0.0–0.5)
HCT: 44 % (ref 34.8–46.6)
HEMOGLOBIN: 14.2 g/dL (ref 11.6–15.9)
Lymphocytes Relative: 23 %
Lymphs Abs: 1.3 10*3/uL (ref 0.9–3.3)
MCH: 27.9 pg (ref 25.1–34.0)
MCHC: 32.3 g/dL (ref 31.5–36.0)
MCV: 86.6 fL (ref 79.5–101.0)
Monocytes Absolute: 0.5 10*3/uL (ref 0.1–0.9)
Monocytes Relative: 9 %
NEUTROS PCT: 62 %
Neutro Abs: 3.5 10*3/uL (ref 1.5–6.5)
PLATELETS: 172 10*3/uL (ref 145–400)
RBC: 5.08 MIL/uL (ref 3.70–5.45)
RDW: 14.9 % — ABNORMAL HIGH (ref 11.2–14.5)
WBC: 5.5 10*3/uL (ref 3.9–10.3)

## 2018-04-27 LAB — COMPREHENSIVE METABOLIC PANEL
ALT: 12 U/L (ref 0–55)
ANION GAP: 8 (ref 3–11)
AST: 17 U/L (ref 5–34)
Albumin: 3.8 g/dL (ref 3.5–5.0)
Alkaline Phosphatase: 66 U/L (ref 40–150)
BILIRUBIN TOTAL: 0.4 mg/dL (ref 0.2–1.2)
BUN: 34 mg/dL — AB (ref 7–26)
CO2: 25 mmol/L (ref 22–29)
Calcium: 9.4 mg/dL (ref 8.4–10.4)
Chloride: 109 mmol/L (ref 98–109)
Creatinine, Ser: 1.57 mg/dL — ABNORMAL HIGH (ref 0.60–1.10)
GFR, EST AFRICAN AMERICAN: 36 mL/min — AB (ref >60)
GFR, EST NON AFRICAN AMERICAN: 31 mL/min — AB (ref >60)
Glucose, Bld: 103 mg/dL (ref 70–140)
POTASSIUM: 4.8 mmol/L (ref 3.5–5.1)
Sodium: 142 mmol/L (ref 136–145)
TOTAL PROTEIN: 7 g/dL (ref 6.4–8.3)

## 2018-04-27 LAB — TSH: TSH: 3.318 u[IU]/mL (ref 0.308–3.960)

## 2018-04-27 MED ORDER — SODIUM CHLORIDE 0.9 % IV SOLN
Freq: Once | INTRAVENOUS | Status: AC
  Administered 2018-04-27: 12:00:00 via INTRAVENOUS

## 2018-04-27 MED ORDER — HEPARIN SOD (PORK) LOCK FLUSH 100 UNIT/ML IV SOLN
500.0000 [IU] | Freq: Once | INTRAVENOUS | Status: AC | PRN
  Administered 2018-04-27: 500 [IU]
  Filled 2018-04-27: qty 5

## 2018-04-27 MED ORDER — SODIUM CHLORIDE 0.9% FLUSH
10.0000 mL | INTRAVENOUS | Status: DC | PRN
  Administered 2018-04-27: 10 mL
  Filled 2018-04-27: qty 10

## 2018-04-27 MED ORDER — SODIUM CHLORIDE 0.9 % IV SOLN
200.0000 mg | Freq: Once | INTRAVENOUS | Status: AC
  Administered 2018-04-27: 200 mg via INTRAVENOUS
  Filled 2018-04-27: qty 8

## 2018-04-27 NOTE — Patient Instructions (Signed)
[facility] Discharge Instructions for Patients Receiving Chemotherapy  Today you received the following chemotherapy agents:  Keytruda.  To help prevent nausea and vomiting after your treatment, we encourage you to take your nausea medication as directed.   If you develop nausea and vomiting that is not controlled by your nausea medication, call the clinic.   BELOW ARE SYMPTOMS THAT SHOULD BE REPORTED IMMEDIATELY:  *FEVER GREATER THAN 100.5 F  *CHILLS WITH OR WITHOUT FEVER  NAUSEA AND VOMITING THAT IS NOT CONTROLLED WITH YOUR NAUSEA MEDICATION  *UNUSUAL SHORTNESS OF BREATH  *UNUSUAL BRUISING OR BLEEDING  TENDERNESS IN MOUTH AND THROAT WITH OR WITHOUT PRESENCE OF ULCERS  *URINARY PROBLEMS  *BOWEL PROBLEMS  UNUSUAL RASH Items with * indicate a potential emergency and should be followed up as soon as possible.  Feel free to call the clinic should you have any questions or concerns. The clinic phone number is [phone].  Please show the CHEMO ALERT CARD at check-in to the Emergency Department and triage nurse.    

## 2018-04-27 NOTE — Telephone Encounter (Signed)
Scheduled appt per 5/2 los - pt to get an updated schedule next visit.

## 2018-04-27 NOTE — Progress Notes (Signed)
Per Dr. [name], ok to treat with current creatinine. 
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
## 2018-05-07 IMAGING — CT CT BIOPSY
2 of 3 series · 13 of 32 positions shown, 19 images · non-contrast
Comparison: none

CLINICAL DATA: History of lung carcinoma. New segment 4 liver
lesion.

[Series 2: i-spiral 5.0 b40f · axial · 0.54mm/px · z∈[-16,+149]mm · 10 of 59 slices shown, 16 images]
[im 6/59  soft-tissue]
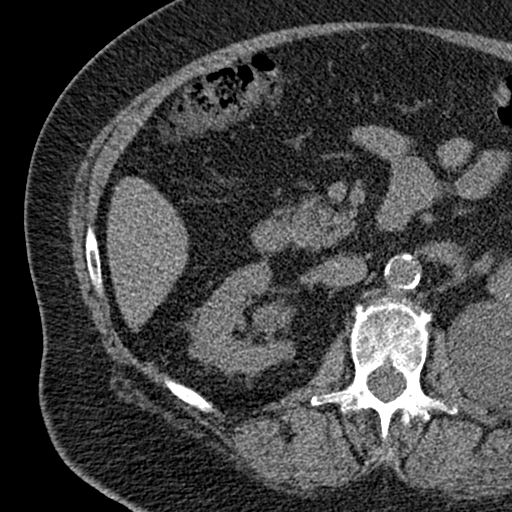
[im 6/59  bone]
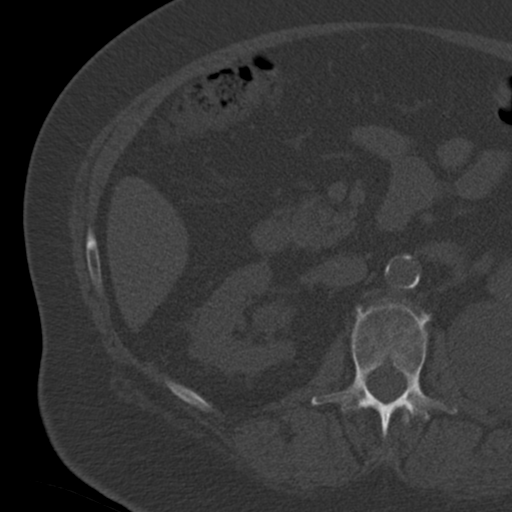
[im 11/59  soft-tissue]
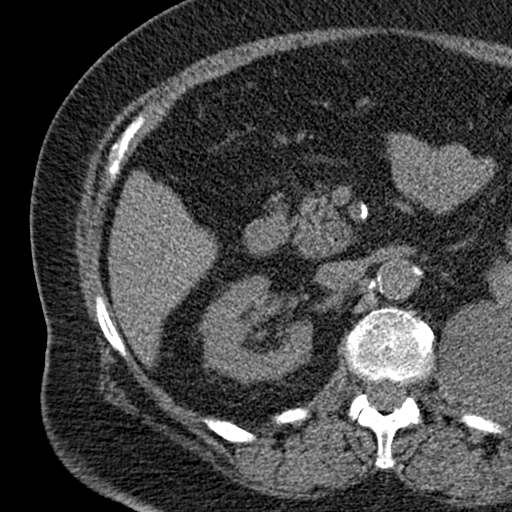
[im 16/59  soft-tissue]
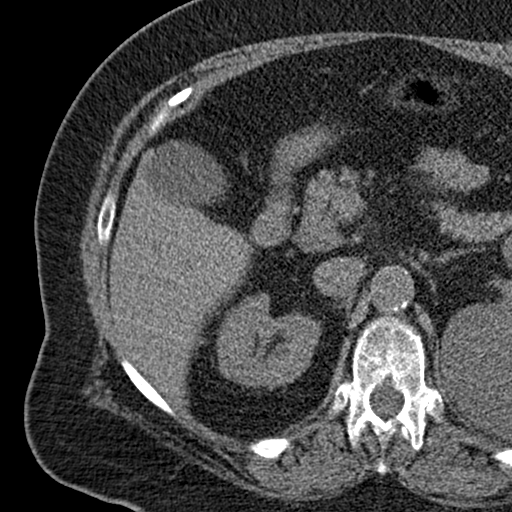
[im 22/59  soft-tissue]
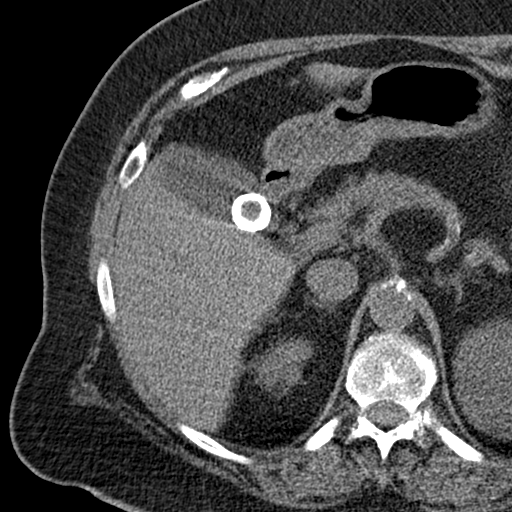
[im 27/59  soft-tissue]
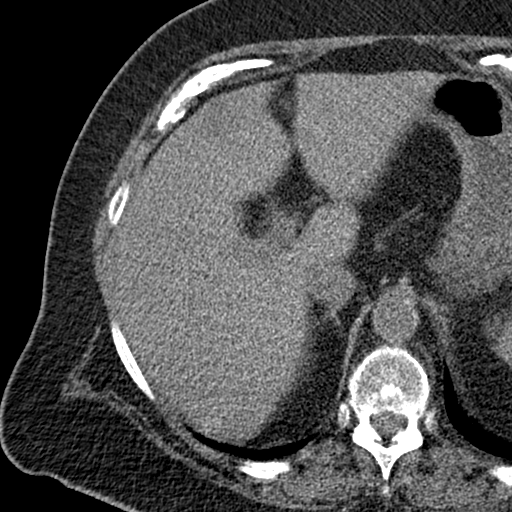
[im 32/59  soft-tissue]
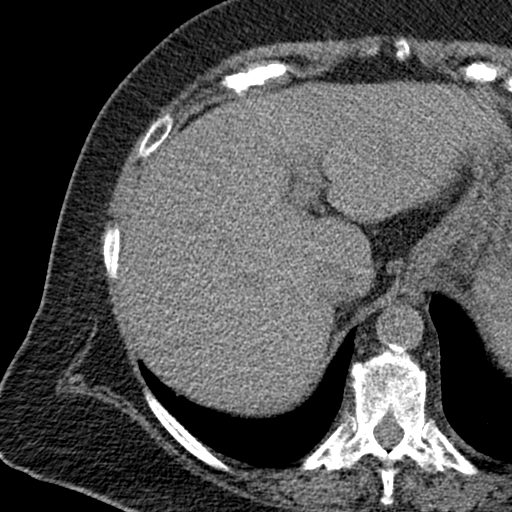
[im 37/59  soft-tissue]
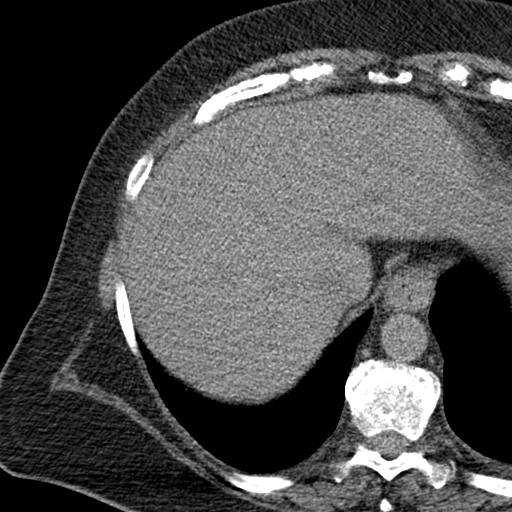
[im 37/59  lung]
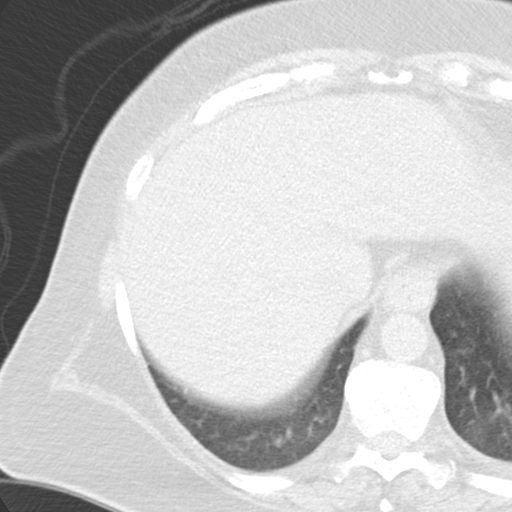
[im 43/59  soft-tissue]
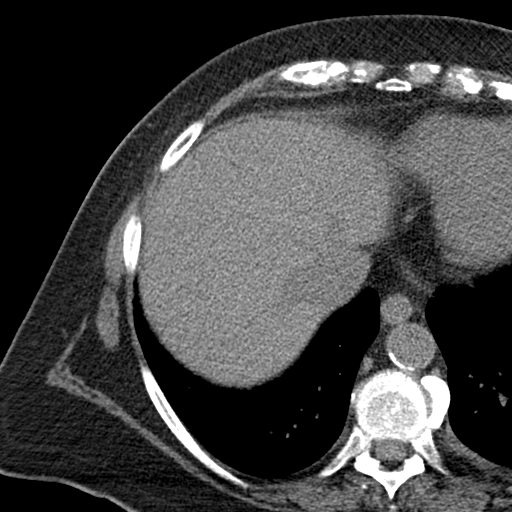
[im 43/59  lung]
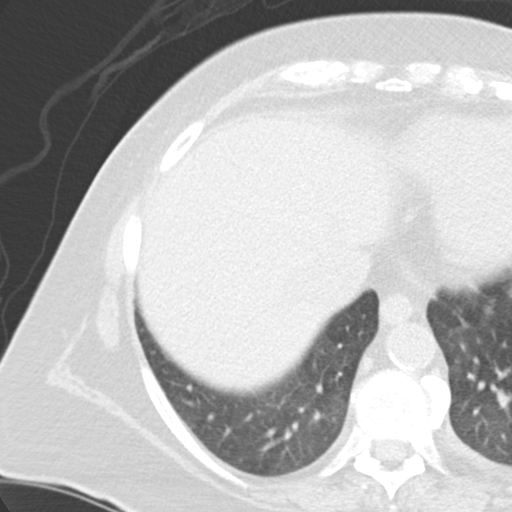
[im 48/59  soft-tissue]
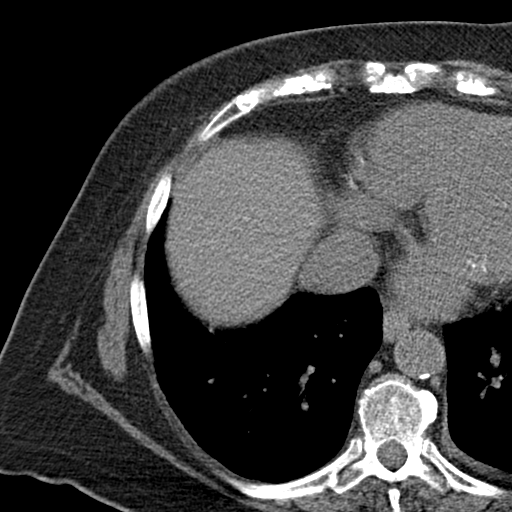
[im 48/59  lung]
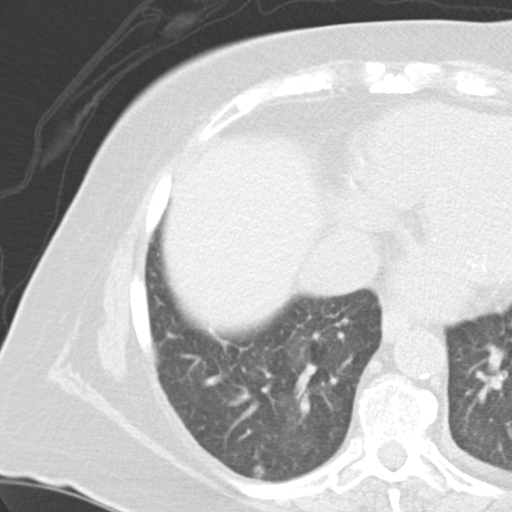
[im 48/59  bone]
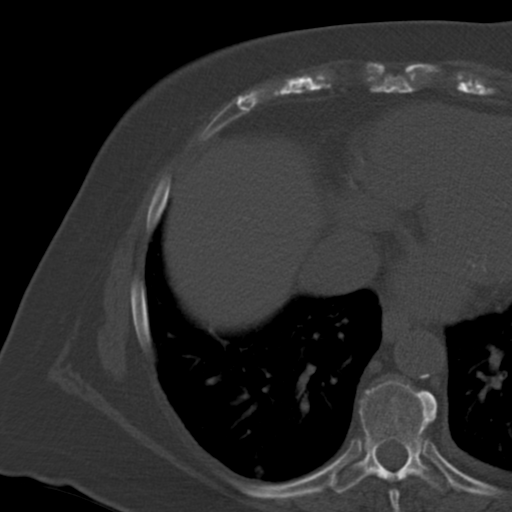
[im 53/59  soft-tissue]
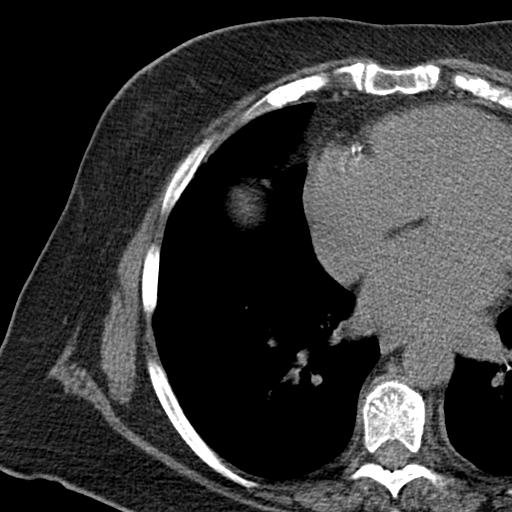
[im 53/59  lung]
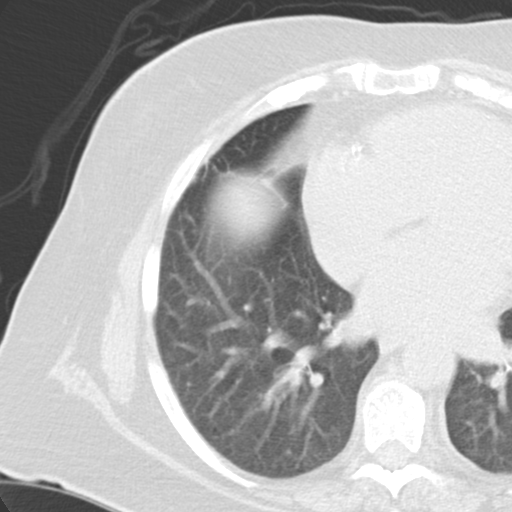

[Series 4: i-sequence 4.8 b40s · axial · 0.98mm/px · z∈[+55,+60]mm · 3 of 39 slices shown]
[im 6/39  soft-tissue]
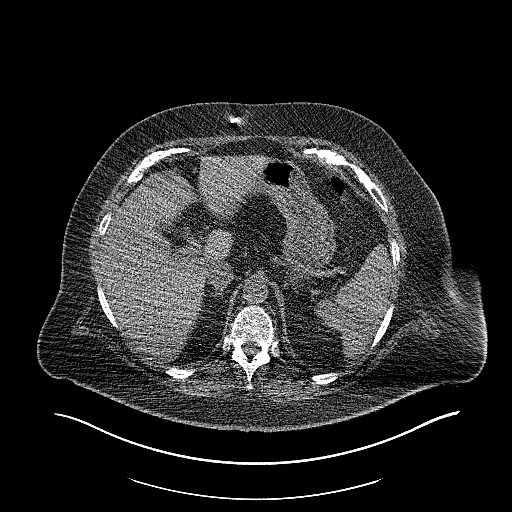
[im 11/39  soft-tissue]
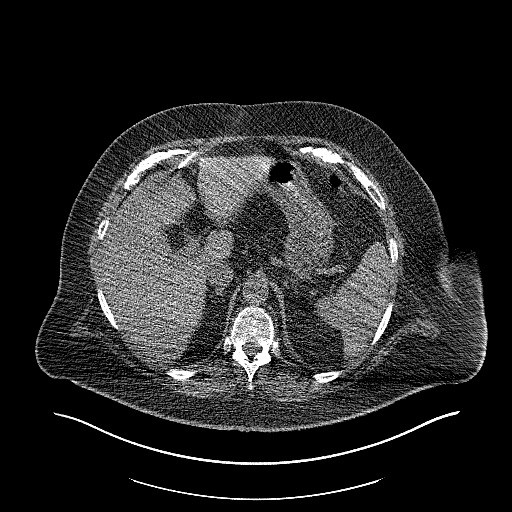
[im 17/39  soft-tissue]
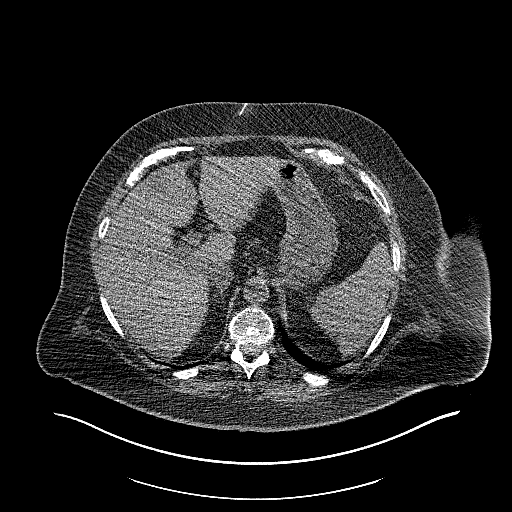

[13 of 32 positions shown; findings below may reference images not displayed]

EXAM:
CT GUIDED CORE BIOPSY OF LIVER LESION

ANESTHESIA/SEDATION:
Intravenous Fentanyl and Versed were administered as conscious
sedation during continuous monitoring of the patient's level of
consciousness and physiological / cardiorespiratory status by the
radiology RN, with a total moderate sedation time of 13 minutes.

PROCEDURE:
The procedure risks, benefits, and alternatives were explained to
the patient. Questions regarding the procedure were encouraged and
answered. The patient understands and consents to the procedure.

Initially, ultrasound was performed in contemplation of ultrasound
guidance for the procedure. However the lesion could not be
localized. Therefore, the patient was transferred to CT.

Select axial scans through the abdomen were obtained. The lesion was
localized an appropriate skin entry site determined and marked.

The operative field was prepped with chlorhexidinein a sterile
fashion, and a sterile drape was applied covering the operative
field. A sterile gown and sterile gloves were used for the
procedure. Local anesthesia was provided with 1% Lidocaine.

Under CT fluoroscopic guidance, a 17 gauge trocar needle was
advanced to the margin of the lesion. Once needle tip position was
confirmed, coaxial 18-gauge core biopsy samples were obtained,
submitted in formalin to surgical pathology. The guide needle was
removed. Postprocedure scans show no hemorrhage or other apparent
complication. The patient tolerated the procedure well.

COMPLICATIONS:
None immediate
FINDINGS: The liver lesion cannot be localized on ultrasound.

The liver lesion was identified on noncontrast CT. Percutaneous core
biopsy samples obtained as above.
IMPRESSION: 1. Technically successful CT-guided core biopsy of liver lesion.

## 2018-05-17 ENCOUNTER — Other Ambulatory Visit: Payer: Self-pay | Admitting: *Deleted
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
## 2018-05-18 ENCOUNTER — Telehealth: Payer: Self-pay | Admitting: Internal Medicine

## 2018-05-18 ENCOUNTER — Inpatient Hospital Stay: Payer: PPO

## 2018-05-18 ENCOUNTER — Inpatient Hospital Stay: Payer: PPO | Admitting: Internal Medicine

## 2018-05-18 ENCOUNTER — Encounter: Payer: Self-pay | Admitting: Internal Medicine

## 2018-05-18 VITALS — BP 134/73 | HR 78 | Temp 98.3°F | Resp 20 | Ht 71.0 in | Wt 276.5 lb

## 2018-05-18 DIAGNOSIS — C3431 Malignant neoplasm of lower lobe, right bronchus or lung: Secondary | ICD-10-CM

## 2018-05-18 DIAGNOSIS — I10 Essential (primary) hypertension: Secondary | ICD-10-CM

## 2018-05-18 DIAGNOSIS — R5382 Chronic fatigue, unspecified: Secondary | ICD-10-CM

## 2018-05-18 DIAGNOSIS — Z5112 Encounter for antineoplastic immunotherapy: Secondary | ICD-10-CM

## 2018-05-18 DIAGNOSIS — Z9981 Dependence on supplemental oxygen: Secondary | ICD-10-CM | POA: Diagnosis not present

## 2018-05-18 DIAGNOSIS — C787 Secondary malignant neoplasm of liver and intrahepatic bile duct: Secondary | ICD-10-CM

## 2018-05-18 DIAGNOSIS — J449 Chronic obstructive pulmonary disease, unspecified: Secondary | ICD-10-CM

## 2018-05-18 DIAGNOSIS — C182 Malignant neoplasm of ascending colon: Secondary | ICD-10-CM | POA: Diagnosis not present

## 2018-05-18 DIAGNOSIS — C189 Malignant neoplasm of colon, unspecified: Secondary | ICD-10-CM

## 2018-05-18 DIAGNOSIS — C229 Malignant neoplasm of liver, not specified as primary or secondary: Secondary | ICD-10-CM

## 2018-05-18 DIAGNOSIS — Z95828 Presence of other vascular implants and grafts: Secondary | ICD-10-CM | POA: Insufficient documentation

## 2018-05-18 LAB — COMPREHENSIVE METABOLIC PANEL
ALT: 15 U/L (ref 0–55)
AST: 16 U/L (ref 5–34)
Albumin: 3.9 g/dL (ref 3.5–5.0)
Alkaline Phosphatase: 53 U/L (ref 40–150)
Anion gap: 9 (ref 3–11)
BILIRUBIN TOTAL: 0.4 mg/dL (ref 0.2–1.2)
BUN: 32 mg/dL — AB (ref 7–26)
CALCIUM: 9 mg/dL (ref 8.4–10.4)
CO2: 24 mmol/L (ref 22–29)
CREATININE: 1.67 mg/dL — AB (ref 0.60–1.10)
Chloride: 110 mmol/L — ABNORMAL HIGH (ref 98–109)
GFR calc Af Amer: 33 mL/min — ABNORMAL LOW (ref >60)
GFR, EST NON AFRICAN AMERICAN: 29 mL/min — AB (ref >60)
Glucose, Bld: 101 mg/dL (ref 70–140)
POTASSIUM: 4.3 mmol/L (ref 3.5–5.1)
Sodium: 143 mmol/L (ref 136–145)
TOTAL PROTEIN: 7.1 g/dL (ref 6.4–8.3)

## 2018-05-18 LAB — CBC WITH DIFFERENTIAL/PLATELET
BASOS ABS: 0.1 10*3/uL (ref 0.0–0.1)
Basophils Relative: 1 %
EOS ABS: 0.3 10*3/uL (ref 0.0–0.5)
EOS PCT: 5 %
HCT: 44.4 % (ref 34.8–46.6)
Hemoglobin: 14.2 g/dL (ref 11.6–15.9)
LYMPHS PCT: 23 %
Lymphs Abs: 1.3 10*3/uL (ref 0.9–3.3)
MCH: 28.2 pg (ref 25.1–34.0)
MCHC: 32 g/dL (ref 31.5–36.0)
MCV: 88.1 fL (ref 79.5–101.0)
Monocytes Absolute: 0.5 10*3/uL (ref 0.1–0.9)
Monocytes Relative: 9 %
Neutro Abs: 3.6 10*3/uL (ref 1.5–6.5)
Neutrophils Relative %: 62 %
PLATELETS: 158 10*3/uL (ref 145–400)
RBC: 5.04 MIL/uL (ref 3.70–5.45)
RDW: 14.6 % — ABNORMAL HIGH (ref 11.2–14.5)
WBC: 5.8 10*3/uL (ref 3.9–10.3)

## 2018-05-18 LAB — TSH: TSH: 2.723 u[IU]/mL (ref 0.308–3.960)

## 2018-05-18 MED ORDER — SODIUM CHLORIDE 0.9% FLUSH
10.0000 mL | INTRAVENOUS | Status: DC | PRN
  Administered 2018-05-18: 10 mL
  Filled 2018-05-18: qty 10

## 2018-05-18 MED ORDER — SODIUM CHLORIDE 0.9 % IV SOLN
Freq: Once | INTRAVENOUS | Status: AC
  Administered 2018-05-18: 11:00:00 via INTRAVENOUS

## 2018-05-18 MED ORDER — HEPARIN SOD (PORK) LOCK FLUSH 100 UNIT/ML IV SOLN
500.0000 [IU] | Freq: Once | INTRAVENOUS | Status: AC | PRN
  Administered 2018-05-18: 500 [IU]
  Filled 2018-05-18: qty 5

## 2018-05-18 MED ORDER — PEMBROLIZUMAB CHEMO INJECTION 100 MG/4ML
200.0000 mg | Freq: Once | INTRAVENOUS | Status: AC
  Administered 2018-05-18: 200 mg via INTRAVENOUS
  Filled 2018-05-18: qty 8

## 2018-05-18 NOTE — Progress Notes (Signed)
Per [name] ok to treat with SCr 1.67.
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
## 2018-05-18 NOTE — Telephone Encounter (Signed)
Scheduled appt per 5/23 los - Gave patient AVS and calender per los.

## 2018-05-18 NOTE — Patient Instructions (Signed)
[facility] Discharge Instructions for Patients Receiving Chemotherapy  Today you received the following chemotherapy agents Beryle Flock   To help prevent nausea and vomiting after your treatment, we encourage you to take your nausea medication as directed  If you develop nausea and vomiting that is not controlled by your nausea medication, call the clinic.   BELOW ARE SYMPTOMS THAT SHOULD BE REPORTED IMMEDIATELY:  *FEVER GREATER THAN 100.5 F  *CHILLS WITH OR WITHOUT FEVER  NAUSEA AND VOMITING THAT IS NOT CONTROLLED WITH YOUR NAUSEA MEDICATION  *UNUSUAL SHORTNESS OF BREATH  *UNUSUAL BRUISING OR BLEEDING  TENDERNESS IN MOUTH AND THROAT WITH OR WITHOUT PRESENCE OF ULCERS  *URINARY PROBLEMS  *BOWEL PROBLEMS  UNUSUAL RASH Items with * indicate a potential emergency and should be followed up as soon as possible.  Feel free to call the clinic you have any questions or concerns. The clinic phone number is [phone].

## 2018-05-29 ENCOUNTER — Encounter: Payer: Self-pay | Admitting: Pediatrics

## 2018-05-29 ENCOUNTER — Ambulatory Visit (INDEPENDENT_AMBULATORY_CARE_PROVIDER_SITE_OTHER): Payer: PPO | Admitting: Pediatrics

## 2018-05-29 VITALS — BP 116/75 | HR 76 | Temp 97.0°F | Ht 71.0 in | Wt 276.6 lb

## 2018-05-29 DIAGNOSIS — M7989 Other specified soft tissue disorders: Secondary | ICD-10-CM | POA: Diagnosis not present

## 2018-05-29 DIAGNOSIS — R399 Unspecified symptoms and signs involving the genitourinary system: Secondary | ICD-10-CM | POA: Diagnosis not present

## 2018-05-29 DIAGNOSIS — I482 Chronic atrial fibrillation, unspecified: Secondary | ICD-10-CM

## 2018-05-29 DIAGNOSIS — N3090 Cystitis, unspecified without hematuria: Secondary | ICD-10-CM

## 2018-05-29 DIAGNOSIS — I10 Essential (primary) hypertension: Secondary | ICD-10-CM

## 2018-05-29 DIAGNOSIS — R609 Edema, unspecified: Secondary | ICD-10-CM

## 2018-05-29 DIAGNOSIS — Z7189 Other specified counseling: Secondary | ICD-10-CM | POA: Diagnosis not present

## 2018-05-29 LAB — URINALYSIS, COMPLETE
Bilirubin, UA: NEGATIVE
Glucose, UA: NEGATIVE
NITRITE UA: NEGATIVE
Specific Gravity, UA: 1.025 (ref 1.005–1.030)
UUROB: 0.2 mg/dL (ref 0.2–1.0)
pH, UA: 5 (ref 5.0–7.5)

## 2018-05-29 LAB — COAGUCHEK XS/INR WAIVED
INR: 2.2 — AB (ref 0.9–1.1)
Prothrombin Time: 26.9 s

## 2018-05-29 LAB — MICROSCOPIC EXAMINATION: WBC, UA: >30 /hpf — AB (ref 0–5)

## 2018-05-29 MED ORDER — CEPHALEXIN 250 MG PO CAPS: 250.0000 mg | ORAL_CAPSULE | Freq: Two times a day (BID) | ORAL | 0 refills | Status: DC

## 2018-05-29 MED ORDER — AMLODIPINE BESYLATE 5 MG PO TABS: 5.0000 mg | ORAL_TABLET | Freq: Every day | ORAL | 1 refills | Status: DC

## 2018-05-29 MED ORDER — SIMVASTATIN 10 MG PO TABS: 10.0000 mg | ORAL_TABLET | Freq: Every day | ORAL | 1 refills | Status: DC

## 2018-05-29 MED ORDER — METOPROLOL TARTRATE 100 MG PO TABS: ORAL_TABLET | ORAL | 1 refills | Status: DC

## 2018-05-29 MED ORDER — FUROSEMIDE 40 MG PO TABS: ORAL_TABLET | ORAL | 1 refills | Status: DC

## 2018-05-29 MED ORDER — WARFARIN SODIUM 2.5 MG PO TABS: 2.5000 mg | ORAL_TABLET | Freq: Every day | ORAL | 1 refills | Status: DC

## 2018-05-29 NOTE — Progress Notes (Signed)
Subjective:   Patient ID: Tammie [name], female    DOB: 06/15/1941, 77 y.o.   MRN: [id number] CC: Medical Management of Chronic Issues and Back Pain  HPI: Tammie [name] is a 77 y.o. female   Here today for follow-up and to sign most form.  She says she discussed the MOST form recently over the phone with the nurse practitioner.   She is pleased that she is feeling as well as she has given her initial prognosis 2 years ago.  She has had a history of non-small cell lung cancer in 2012, breast cancer in 2014, second non-small cell lung cancer July 2015, metastatic colon adenocarcinoma with liver mets diagnosed in October 2017.  She is following with oncology.   For the last week she has had urinary frequency, more than usual.  Some discomfort in her lower abdomen.  No fevers.  Appetite is been fine.  Hypertension: Taking medicines regularly.  No chest pain with exertion.  Atrial fibrillation: Due for INR.  Taking warfarin regularly.  Relevant past medical, surgical, family and social history reviewed. Allergies and medications reviewed and updated. Social History   Tobacco Use  Smoking Status Former Smoker  . Packs/day: 3.00  . Years: 32.00  . Pack years: 96.00  . Types: Cigarettes  . Start date: 12/27/1989  . Last attempt to quit: 03/08/1990  . Years since quitting: 28.2  Smokeless Tobacco Former Systems developer  . Quit date: 03/08/1990  Tobacco Comment   smoked 3ppd from 1959-1991    ROS: Per HPI   Objective:    BP 116/75   Pulse 76   Temp (!) 97 F (36.1 C) (Oral)   Ht 5\' 11"  (1.803 m)   Wt 276 lb 9.6 oz (125.5 kg)   BMI 38.58 kg/m   Wt Readings from Last 3 Encounters:  05/29/18 276 lb 9.6 oz (125.5 kg)  05/18/18 276 lb 8 oz (125.4 kg)  04/27/18 278 lb 6.4 oz (126.3 kg)    Gen: NAD, alert, cooperative with exam, NCAT EYES: EOMI, no conjunctival injection, or no icterus CV: NRRR, normal S1/S2 Resp: CTABL, no wheezes, normal WOB Abd: +BS, soft, NTND. no guarding or  organomegaly Ext: 1+ pitting edema bilateral ankles, warm Neuro: Alert and oriented MSK: normal muscle bulk  Assessment & Plan:  Tammie [name] was seen today for medical management of chronic issues and back pain.  Diagnoses and all orders for this visit:  Cystitis Positive UA, treat with below. -     cephALEXin (KEFLEX) 250 MG capsule; Take 1 capsule (250 mg total) by mouth 2 (two) times daily.  UTI symptoms -     Urinalysis, Complete -     Urine Culture; Future -     Urine Culture  Leg swelling -     amLODipine (NORVASC) 5 MG tablet; Take 1 tablet (5 mg total) by mouth daily.  Peripheral edema -     furosemide (LASIX) 40 MG tablet; TAKE ONE TABLET BY MOUTH IN THE MORNING  Essential hypertension Stable, continue below -     metoprolol tartrate (LOPRESSOR) 100 MG tablet; TAKE 1 & 1/2 (ONE & ONE-HALF) TABLETS BY MOUTH TWICE DAILY  Chronic atrial fibrillation (HCC) INR 2.2, continue current medicines.   -     warfarin (COUMADIN) 2.5 MG tablet; Take 1 tablet (2.5 mg total) by mouth daily. Except take 1/2 tablet on Fridays -     CoaguChek XS/INR Waived  Advance care planning This is a follow-up conversation to one which she  had already discussed with a nurse practitioner on the phone.  Her questions were answered.  She is okay with trial of IV fluids, trial of antibiotics if admitted and appropriate.  She does not want CPR.  She would be okay with intubation trial if for reversible cause.  If an acute illness is related to progression of her cancer and not reversible she would not want life-prolonging treatment.  She says she would want the doctor taking care of her to make end-of-life decisions for her if she were not able to.  She is worried about her family disagreeing and fighting amongst themselves if one of them was to be the Media planner.  She has 2 grandsons. MOST form signed.  She does have an advanced directive that she says says above.  Follow up plan: Return in about 3 months  (around 08/29/2018). [name], MD [place]

## 2018-06-01 LAB — URINE CULTURE

## 2018-06-03 ENCOUNTER — Telehealth: Payer: Self-pay | Admitting: Internal Medicine

## 2018-06-03 NOTE — Telephone Encounter (Signed)
Spoke with patient regarding infusions appointment change for 7/25.  She will get new schedule at next visit

## 2018-06-05 ENCOUNTER — Telehealth: Payer: Self-pay | Admitting: Pediatrics

## 2018-06-05 ENCOUNTER — Ambulatory Visit (INDEPENDENT_AMBULATORY_CARE_PROVIDER_SITE_OTHER): Payer: PPO | Admitting: Family Medicine

## 2018-06-05 ENCOUNTER — Other Ambulatory Visit: Payer: Self-pay

## 2018-06-05 VITALS — BP 104/49 | HR 73 | Temp 97.4°F | Ht 71.0 in | Wt 279.0 lb

## 2018-06-05 DIAGNOSIS — L02416 Cutaneous abscess of left lower limb: Secondary | ICD-10-CM

## 2018-06-05 MED ORDER — CEPHALEXIN 250 MG PO CAPS: 250.0000 mg | ORAL_CAPSULE | Freq: Four times a day (QID) | ORAL | 0 refills | Status: AC

## 2018-06-05 MED ORDER — CEFTRIAXONE SODIUM 1 G IJ SOLR
1.0000 g | Freq: Once | INTRAMUSCULAR | Status: AC
Start: 2018-06-05 — End: 2018-06-05
  Administered 2018-06-05: 1 g via INTRAMUSCULAR

## 2018-06-05 MED ORDER — CLINDAMYCIN HCL 300 MG PO CAPS
300.0000 mg | ORAL_CAPSULE | Freq: Four times a day (QID) | ORAL | 0 refills | Status: AC
Start: 2018-06-05 — End: 2018-06-15

## 2018-06-05 NOTE — Telephone Encounter (Signed)
Apt made this morning with Dr. Darnell Level.
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
## 2018-06-05 NOTE — Patient Outreach (Signed)
[facility]) Care Management  06/05/2018  OGECHI KUEHNEL September 07, 1941 [id number]   Telephone Screen  Referral Date: 06/05/18 Referral Source: Nurse Call Center Referral Reason: " caller states she was diagnosed with UTI on Monday, prescribed Keflex. She reports a swollen area on her left upper inner aspect of thigh, first noticed on Tuesday. The area is painful to touch or with movement. She reports the area is approximately half dollar in size. Denies drainage,fever or rashes." Insurance: HTA   Outreach attempt # 1 to patient. Spoke with patient. Very noisy background as several dogs barking loud in the background and difficult to talk with patient. Patient reports that she wen tot MD appt today to follow up on the issues. She was told she had an abscess to area. She states that area started draining last night. MD gave her a shot as well as placed her on two antibiotics. She reports that area still is painful at times and she is taking Tylenol prn. She has to go back and follow up with MD in two days to see if area improving. Patient aware of s/s and when to seek medical attention. No further RN CM needs or concerns at this time. Patient advised to call 24 hr nurse line for any future needs or concerns.      Plan: RN CM will close case at this time.    Enzo Montgomery, RN,BSN,CCM [place] Management Telephonic Care Management Coordinator Direct Phone: [phone] Toll Free: [phone] Fax: [phone]

## 2018-06-05 NOTE — Patient Instructions (Signed)
I have prescribed you double coverage for the infection in your left thigh.  If your symptoms worsen or your fever persist despite antibiotics, please seek immediate medical attention in the emergency department.  This may indicate that you need IV antibiotics.  Please follow-up with Dr. Evette Doffing in 2 days for wound recheck.   Skin Abscess A skin abscess is an infected area on or under your skin that contains pus and other material. An abscess can happen almost anywhere on your body. Some abscesses break open (rupture) on their own. Most continue to get worse unless they are treated. The infection can spread deeper into the body and into your blood, which can make you feel sick. Treatment usually involves draining the abscess. Follow these instructions at home: Abscess Care  If you have an abscess that has not drained, place a warm, clean, wet washcloth over the abscess several times a day. Do this as told by your doctor.  Follow instructions from your doctor about how to take care of your abscess. Make sure you: ? Cover the abscess with a bandage (dressing). ? Change your bandage or gauze as told by your doctor. ? Wash your hands with soap and water before you change the bandage or gauze. If you cannot use soap and water, use hand sanitizer.  Check your abscess every day for signs that the infection is getting worse. Check for: ? More redness, swelling, or pain. ? More fluid or blood. ? Warmth. ? More pus or a bad smell. Medicines   Take over-the-counter and prescription medicines only as told by your doctor.  If you were prescribed an antibiotic medicine, take it as told by your doctor. Do not stop taking the antibiotic even if you start to feel better. General instructions  To avoid spreading the infection: ? Do not share personal care items, towels, or hot tubs with others. ? Avoid making skin-to-skin contact with other people.  Keep all follow-up visits as told by your doctor.  This is important. Contact a doctor if:  You have more redness, swelling, or pain around your abscess.  You have more fluid or blood coming from your abscess.  Your abscess feels warm when you touch it.  You have more pus or a bad smell coming from your abscess.  You have a fever.  Your muscles ache.  You have chills.  You feel sick. Get help right away if:  You have very bad (severe) pain.  You see red streaks on your skin spreading away from the abscess. This information is not intended to replace advice given to you by your health care provider. Make sure you discuss any questions you have with your health care provider. Document Released: 05/31/2008 Document Revised: 08/08/2016 Document Reviewed: 10/22/2015 Elsevier Interactive Patient Education  [employer].

## 2018-06-07 ENCOUNTER — Ambulatory Visit (INDEPENDENT_AMBULATORY_CARE_PROVIDER_SITE_OTHER): Payer: PPO | Admitting: Pediatrics

## 2018-06-07 ENCOUNTER — Encounter: Payer: Self-pay | Admitting: Pediatrics

## 2018-06-07 VITALS — BP 126/61 | HR 71 | Temp 97.1°F | Ht 71.0 in | Wt 280.0 lb

## 2018-06-07 DIAGNOSIS — Z5181 Encounter for therapeutic drug level monitoring: Secondary | ICD-10-CM | POA: Diagnosis not present

## 2018-06-07 DIAGNOSIS — L02416 Cutaneous abscess of left lower limb: Secondary | ICD-10-CM | POA: Diagnosis not present

## 2018-06-07 DIAGNOSIS — Z7901 Long term (current) use of anticoagulants: Secondary | ICD-10-CM | POA: Diagnosis not present

## 2018-06-07 LAB — COAGUCHEK XS/INR WAIVED
INR: 2.7 — ABNORMAL HIGH (ref 0.9–1.1)
Prothrombin Time: 32.4 s

## 2018-06-07 NOTE — Progress Notes (Signed)
  Subjective:   Patient ID: Tammie [name], female    DOB: 1941-07-13, 77 y.o.   MRN: [id number] CC: Wound Check  HPI: Tammie [name] is a 77 y.o. female   Seen here 3 days ago for new abscess on her left upper thigh.  At that time she was treated for a wound on her leg with clindamycin and longer course of Keflex.  Patient reports that is much improved from that visit.  Still slightly tender and sore.  Still draining some, but not as much.  Drainage does have an odor to it.  She is changing dressing several times a day because of amount of fluid from it.  No fevers.  She has follow-up coming up with oncology.  She has been taking her warfarin regularly.  Adhesive stuck to her skin overnight in that area from a sanitary pad the day or so before she noticed the infection.  Relevant past medical, surgical, family and social history reviewed. Allergies and medications reviewed and updated. Social History   Tobacco Use  Smoking Status Former Smoker  . Packs/day: 3.00  . Years: 32.00  . Pack years: 96.00  . Types: Cigarettes  . Start date: 12/27/1989  . Last attempt to quit: 03/08/1990  . Years since quitting: 28.2  Smokeless Tobacco Former Systems developer  . Quit date: 03/08/1990  Tobacco Comment   smoked 3ppd from 1959-1991    ROS: Per HPI   Objective:    BP 126/61   Pulse 71   Temp (!) 97.1 F (36.2 C) (Oral)   Ht 5\' 11"  (1.803 m)   Wt 280 lb (127 kg)   BMI 39.05 kg/m   Wt Readings from Last 3 Encounters:  06/08/18 281 lb 8 oz (127.7 kg)  06/07/18 280 lb (127 kg)  06/05/18 279 lb (126.6 kg)    Gen: NAD, alert, cooperative with exam, NCAT EYES: EOMI, no conjunctival injection, or no icterus CV: NRRR, normal S1/S2, no murmur, distal pulses 2+ b/l Resp: CTABL, no wheezes, normal WOB Abd: +BS, soft, NTND.  Neuro: Alert and oriented Skin: 52mmx15xx yellow based ulceration L upper inner leg.  A couple centimeters of red induration, mildly tender to palpation.  No  fluctuance.  Assessment & Plan:  Tammie [name] was seen today for wound check.  Diagnoses and all orders for this visit:  Anticoagulation management encounter Recent antibiotics, INR 2.7.  Continue current dosing. -     CoaguChek XS/INR Waived  Abscess of left thigh Symptoms improved per patient. >  1 cm ulceration at site of wound.  Will return in 1 week for wound check.  Discussed wound care, regularly changing bandages.  Continue antibiotics.  Will be seen before the end of her antibiotic course.  Return precautions discussed.  Follow up plan: Return in about 1 week (around 06/14/2018). [name], MD [place]

## 2018-06-08 ENCOUNTER — Inpatient Hospital Stay: Payer: PPO

## 2018-06-08 ENCOUNTER — Telehealth: Payer: Self-pay | Admitting: Internal Medicine

## 2018-06-08 ENCOUNTER — Encounter: Payer: Self-pay | Admitting: Pediatrics

## 2018-06-08 ENCOUNTER — Encounter: Payer: Self-pay | Admitting: Internal Medicine

## 2018-06-08 ENCOUNTER — Inpatient Hospital Stay: Payer: PPO | Attending: Internal Medicine | Admitting: Internal Medicine

## 2018-06-08 VITALS — BP 149/86 | HR 66 | Temp 98.1°F | Resp 17 | Ht 71.0 in | Wt 281.5 lb

## 2018-06-08 DIAGNOSIS — C182 Malignant neoplasm of ascending colon: Secondary | ICD-10-CM | POA: Diagnosis not present

## 2018-06-08 DIAGNOSIS — Z95828 Presence of other vascular implants and grafts: Secondary | ICD-10-CM

## 2018-06-08 DIAGNOSIS — Z85118 Personal history of other malignant neoplasm of bronchus and lung: Secondary | ICD-10-CM

## 2018-06-08 DIAGNOSIS — Z853 Personal history of malignant neoplasm of breast: Secondary | ICD-10-CM | POA: Diagnosis not present

## 2018-06-08 DIAGNOSIS — Z5112 Encounter for antineoplastic immunotherapy: Secondary | ICD-10-CM | POA: Diagnosis not present

## 2018-06-08 DIAGNOSIS — C229 Malignant neoplasm of liver, not specified as primary or secondary: Secondary | ICD-10-CM

## 2018-06-08 DIAGNOSIS — Z79899 Other long term (current) drug therapy: Secondary | ICD-10-CM | POA: Diagnosis not present

## 2018-06-08 DIAGNOSIS — C787 Secondary malignant neoplasm of liver and intrahepatic bile duct: Secondary | ICD-10-CM | POA: Diagnosis not present

## 2018-06-08 DIAGNOSIS — C3431 Malignant neoplasm of lower lobe, right bronchus or lung: Secondary | ICD-10-CM

## 2018-06-08 DIAGNOSIS — R5383 Other fatigue: Secondary | ICD-10-CM | POA: Diagnosis not present

## 2018-06-08 DIAGNOSIS — C50411 Malignant neoplasm of upper-outer quadrant of right female breast: Secondary | ICD-10-CM

## 2018-06-08 DIAGNOSIS — C3490 Malignant neoplasm of unspecified part of unspecified bronchus or lung: Secondary | ICD-10-CM

## 2018-06-08 DIAGNOSIS — R5382 Chronic fatigue, unspecified: Secondary | ICD-10-CM

## 2018-06-08 LAB — CMP (CANCER CENTER ONLY)
ALT: 10 U/L (ref 0–55)
AST: 14 U/L (ref 5–34)
Albumin: 3.3 g/dL — ABNORMAL LOW (ref 3.5–5.0)
Alkaline Phosphatase: 63 U/L (ref 40–150)
Anion gap: 7 (ref 3–11)
BUN: 20 mg/dL (ref 7–26)
CHLORIDE: 109 mmol/L (ref 98–109)
CO2: 27 mmol/L (ref 22–29)
Calcium: 9.2 mg/dL (ref 8.4–10.4)
Creatinine: 1.44 mg/dL — ABNORMAL HIGH (ref 0.60–1.10)
GFR, EST NON AFRICAN AMERICAN: 34 mL/min — AB (ref >60)
GFR, Est AFR Am: 40 mL/min — ABNORMAL LOW (ref >60)
Glucose, Bld: 109 mg/dL (ref 70–140)
POTASSIUM: 4.5 mmol/L (ref 3.5–5.1)
SODIUM: 143 mmol/L (ref 136–145)
Total Bilirubin: 0.3 mg/dL (ref 0.2–1.2)
Total Protein: 6.6 g/dL (ref 6.4–8.3)

## 2018-06-08 LAB — CBC WITH DIFFERENTIAL (CANCER CENTER ONLY)
Basophils Absolute: 0 10*3/uL (ref 0.0–0.1)
Basophils Relative: 1 %
EOS PCT: 5 %
Eosinophils Absolute: 0.3 10*3/uL (ref 0.0–0.5)
HEMATOCRIT: 39.6 % (ref 34.8–46.6)
Hemoglobin: 12.4 g/dL (ref 11.6–15.9)
LYMPHS ABS: 0.8 10*3/uL — AB (ref 0.9–3.3)
LYMPHS PCT: 17 %
MCH: 28.4 pg (ref 25.1–34.0)
MCHC: 31.3 g/dL — ABNORMAL LOW (ref 31.5–36.0)
MCV: 90.8 fL (ref 79.5–101.0)
MONO ABS: 0.5 10*3/uL (ref 0.1–0.9)
Monocytes Relative: 10 %
Neutro Abs: 3.2 10*3/uL (ref 1.5–6.5)
Neutrophils Relative %: 67 %
PLATELETS: 187 10*3/uL (ref 145–400)
RBC: 4.36 MIL/uL (ref 3.70–5.45)
RDW: 14.8 % — AB (ref 11.2–14.5)
WBC Count: 4.8 10*3/uL (ref 3.9–10.3)

## 2018-06-08 LAB — TSH: TSH: 2.311 u[IU]/mL (ref 0.308–3.960)

## 2018-06-08 MED ORDER — SODIUM CHLORIDE 0.9% FLUSH
10.0000 mL | INTRAVENOUS | Status: DC | PRN
  Administered 2018-06-08: 10 mL
  Filled 2018-06-08: qty 10

## 2018-06-08 MED ORDER — HEPARIN SOD (PORK) LOCK FLUSH 100 UNIT/ML IV SOLN
500.0000 [IU] | Freq: Once | INTRAVENOUS | Status: AC | PRN
  Administered 2018-06-08: 500 [IU]
  Filled 2018-06-08: qty 5

## 2018-06-08 MED ORDER — SODIUM CHLORIDE 0.9 % IV SOLN
200.0000 mg | Freq: Once | INTRAVENOUS | Status: AC
  Administered 2018-06-08: 200 mg via INTRAVENOUS
  Filled 2018-06-08: qty 8

## 2018-06-08 MED ORDER — SODIUM CHLORIDE 0.9 % IV SOLN
Freq: Once | INTRAVENOUS | Status: AC
  Administered 2018-06-08: 12:00:00 via INTRAVENOUS

## 2018-06-08 NOTE — Telephone Encounter (Signed)
Scheduled appt per 6/13 los   gave patient AVS and calender per los.

## 2018-06-08 NOTE — Patient Instructions (Signed)
[facility] Discharge Instructions for Patients Receiving Chemotherapy  Today you received the following chemotherapy agents:  Keytruda.  To help prevent nausea and vomiting after your treatment, we encourage you to take your nausea medication as directed.   If you develop nausea and vomiting that is not controlled by your nausea medication, call the clinic.   BELOW ARE SYMPTOMS THAT SHOULD BE REPORTED IMMEDIATELY:  *FEVER GREATER THAN 100.5 F  *CHILLS WITH OR WITHOUT FEVER  NAUSEA AND VOMITING THAT IS NOT CONTROLLED WITH YOUR NAUSEA MEDICATION  *UNUSUAL SHORTNESS OF BREATH  *UNUSUAL BRUISING OR BLEEDING  TENDERNESS IN MOUTH AND THROAT WITH OR WITHOUT PRESENCE OF ULCERS  *URINARY PROBLEMS  *BOWEL PROBLEMS  UNUSUAL RASH Items with * indicate a potential emergency and should be followed up as soon as possible.  Feel free to call the clinic should you have any questions or concerns. The clinic phone number is [phone].  Please show the CHEMO ALERT CARD at check-in to the Emergency Department and triage nurse.    
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
## 2018-06-15 ENCOUNTER — Encounter: Payer: Self-pay | Admitting: Pediatrics

## 2018-06-15 ENCOUNTER — Ambulatory Visit (INDEPENDENT_AMBULATORY_CARE_PROVIDER_SITE_OTHER): Payer: PPO | Admitting: Pediatrics

## 2018-06-15 VITALS — BP 133/68 | HR 61 | Temp 97.9°F | Ht 71.0 in | Wt 277.2 lb

## 2018-06-15 DIAGNOSIS — L03116 Cellulitis of left lower limb: Secondary | ICD-10-CM

## 2018-06-15 NOTE — Progress Notes (Signed)
  Subjective:   Patient ID: Tammie [name], female    DOB: [birth year], 77 y.o.   MRN: [id number] CC: Wound Check  HPI: Tammie [name] is a 77 y.o. female   Recent cellulitis and abscess of left upper leg.  Treated with antibiotics.  Still with about a dime sized with a drainage daily, drainage now without odor.  Changing pad regularly. Tenderness much improved.   Appetite is come back to normal, she was slightly nauseous while on the antibiotics. No fevers.  Relevant past medical, surgical, family and social history reviewed. Allergies and medications reviewed and updated. Social History   Tobacco Use  Smoking Status Former Smoker  . Packs/day: 3.00  . Years: 32.00  . Pack years: 96.00  . Types: Cigarettes  . Start date: 12/27/1989  . Last attempt to quit: 03/08/1990  . Years since quitting: 28.2  Smokeless Tobacco Former Systems developer  . Quit date: 03/08/1990  Tobacco Comment   smoked 3ppd from 1959-1991    ROS: Per HPI   Objective:    BP 133/68   Pulse 61   Temp 97.9 F (36.6 C) (Oral)   Ht 5\' 11"  (1.803 m)   Wt 277 lb 3.2 oz (125.7 kg)   BMI 38.66 kg/m   Wt Readings from Last 3 Encounters:  06/15/18 277 lb 3.2 oz (125.7 kg)  06/08/18 281 lb 8 oz (127.7 kg)  06/07/18 280 lb (127 kg)    Gen: NAD, alert, cooperative with exam, NCAT EYES: EOMI, no conjunctival injection, or no icterus Neuro: Alert and oriented Skin: apprx 87mm x 38mm wound, no surround redness, 28mm deep, yellow base, moist. No fluctuance, no induration.  Assessment & Plan:  [name] was seen today for wound check.  Diagnoses and all orders for this visit:  Cellulitis of left lower extremity Much improved. Wound smaller. Continue wound care. Return precautions discussed.  Follow up plan: Return in about 2 months (around 08/15/2018). [name], MD [place]

## 2018-06-27 IMAGING — CT NM PET TUM IMG RESTAG (PS) SKULL BASE T - THIGH
1 of 8 series · 1 of 25 positions shown · non-contrast
Comparison: Multiple exams, including chest CTA 09/14/2016 and
PET-CT 06/11/2014

CLINICAL DATA: Subsequent treatment strategy for right breast
cancer. Right lower lobe lung cancer..

EXAM:
NUCLEAR MEDICINE PET SKULL BASE TO THIGH
TECHNIQUE: 13.1 mCi F-18 FDG was injected intravenously. Full-ring PET imaging
was performed from the skull base to thigh after the radiotracer. CT
data was obtained and used for attenuation correction and anatomic
localization.
FASTING BLOOD GLUCOSE:  Value: 111 mg/dl

[Series 4: ct sk_thigh 5.0 b31f · axial · 5.0mm · 0.98mm/px · 1 of 236 slices shown]
[im 236/236  brain]
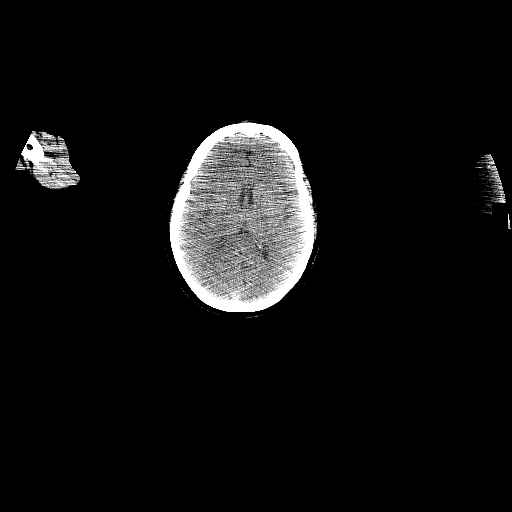

[1 of 25 positions shown; findings below may reference images not displayed]

FINDINGS: NECK

No hypermetabolic lymph nodes in the neck.

CHEST

In the location of the previous right upper lobe and superior
segment right lower lobe pulmonary nodules there are not
postoperative findings and no residual significant degree of
hypermetabolic activity to suggest local recurrence. Mild
cardiomegaly. 8 by 7 mm right lower lobe pulmonary nodule on image
49/8 appears less solid than on 09/14/2016 and is not appreciably
hypermetabolic although some of the reduced appearance of solidity
could be from slice selection and motion artifact (CT images are
primarily for anatomic localization). Postoperative findings in the
left upper lobe with associated scarring. In the superior segment
left lower lobe there is a stable 10 mm in long axis ground-glass
density nodule without hypermetabolic activity.

Coronary, aortic arch, and branch vessel atherosclerotic vascular
disease.

ABDOMEN/PELVIS

In segment 4b of the liver, an indistinctly marginated approximately
2.3 cm hypodense lesion has a maximum standard uptake value of 17.9,
compatible with malignancy. In addition, in the porta hepatis there
is a lymph node or nodule measuring 1.2 by 1.5 cm on image 104 of
series 4 of the CT data, maximum SUV 12.6, compatible with
malignancy, and new compared to the prior PET-CT.

2.4 cm gallstone.

Left renal cysts are photopenic. Nonobstructive right
nephrolithiasis. Small right renal cyst. Aortoiliac atherosclerotic
vascular disease.

SKELETON

No focal hypermetabolic activity to suggest skeletal metastasis.
IMPRESSION: 1. 2.3 cm hypermetabolic mass in segment 4b of the liver, favoring a
metastatic lesion.
2. Hypermetabolic lymph node along the porta hepatis compatible with
local malignancy.
3. Postoperative findings in both lungs. There are 2 notable sub
solid nodules which are not hypermetabolic but which are stable from
the prior chest CT from [facility] ; low grade adenocarcinoma is not
completely excluded and surveillance of these nodules is warranted.
4. Other imaging findings of potential clinical significance:
Nonobstructive right nephrolithiasis. Cholelithiasis. Renal cysts.
Aortoiliac atherosclerotic vascular disease. Coronary, aortic arch,
and branch vessel atherosclerotic vascular disease.

## 2018-06-30 ENCOUNTER — Other Ambulatory Visit: Payer: Self-pay | Admitting: Medical Oncology

## 2018-06-30 ENCOUNTER — Encounter (HOSPITAL_COMMUNITY): Payer: Self-pay

## 2018-06-30 ENCOUNTER — Ambulatory Visit: Payer: PPO | Admitting: Oncology

## 2018-06-30 ENCOUNTER — Other Ambulatory Visit: Payer: PPO

## 2018-06-30 ENCOUNTER — Ambulatory Visit (HOSPITAL_COMMUNITY)
Admission: RE | Admit: 2018-06-30 | Discharge: 2018-06-30 | Disposition: A | Discharge status: Home / Self Care | Payer: PPO | Source: Ambulatory Visit | Attending: Internal Medicine | Admitting: Internal Medicine

## 2018-06-30 ENCOUNTER — Ambulatory Visit: Payer: PPO

## 2018-06-30 DIAGNOSIS — R911 Solitary pulmonary nodule: Secondary | ICD-10-CM | POA: Diagnosis not present

## 2018-06-30 DIAGNOSIS — C3490 Malignant neoplasm of unspecified part of unspecified bronchus or lung: Secondary | ICD-10-CM | POA: Diagnosis not present

## 2018-06-30 DIAGNOSIS — K6389 Other specified diseases of intestine: Secondary | ICD-10-CM | POA: Insufficient documentation

## 2018-06-30 DIAGNOSIS — I080 Rheumatic disorders of both mitral and aortic valves: Secondary | ICD-10-CM | POA: Insufficient documentation

## 2018-06-30 DIAGNOSIS — I2510 Atherosclerotic heart disease of native coronary artery without angina pectoris: Secondary | ICD-10-CM | POA: Diagnosis not present

## 2018-06-30 DIAGNOSIS — N200 Calculus of kidney: Secondary | ICD-10-CM | POA: Insufficient documentation

## 2018-06-30 DIAGNOSIS — I700 Atherosclerosis of aorta: Secondary | ICD-10-CM | POA: Insufficient documentation

## 2018-06-30 DIAGNOSIS — N2889 Other specified disorders of kidney and ureter: Secondary | ICD-10-CM | POA: Diagnosis not present

## 2018-06-30 DIAGNOSIS — C229 Malignant neoplasm of liver, not specified as primary or secondary: Secondary | ICD-10-CM

## 2018-06-30 DIAGNOSIS — C189 Malignant neoplasm of colon, unspecified: Secondary | ICD-10-CM | POA: Diagnosis not present

## 2018-06-30 DIAGNOSIS — K8020 Calculus of gallbladder without cholecystitis without obstruction: Secondary | ICD-10-CM | POA: Insufficient documentation

## 2018-06-30 DIAGNOSIS — C787 Secondary malignant neoplasm of liver and intrahepatic bile duct: Secondary | ICD-10-CM | POA: Diagnosis not present

## 2018-06-30 DIAGNOSIS — M47895 Other spondylosis, thoracolumbar region: Secondary | ICD-10-CM | POA: Diagnosis not present

## 2018-06-30 DIAGNOSIS — C3492 Malignant neoplasm of unspecified part of left bronchus or lung: Secondary | ICD-10-CM | POA: Diagnosis not present

## 2018-06-30 MED ORDER — IOPAMIDOL (ISOVUE-300) INJECTION 61%
INTRAVENOUS | Status: AC
  Filled 2018-06-30: qty 30
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
## 2018-06-30 NOTE — Assessment & Plan Note (Addendum)
[name] was multiple malignancies including history of breast cancer, history of lung cancer status post resection and most recently treated with metastatic colon adenocarcinoma with liver metastasis and MSI high. The patient is currently on treatment with Keytruda 200 mg IV every 3 weeks status post 26 cycles.   The patient continues to tolerate this treatment well with the exception of fatigue. She had a restaging CT scan of the chest, abdomen, pelvis and is here to discuss results.  Patient was seen with [name].  CT scan results showed evidence of disease progression.  Recommend that she continue on Keytruda.  She will proceed with cycle #27 today as scheduled.  The patient will follow-up in 3 weeks for evaluation prior to cycle #28.  For nausea, she was given a prescription for Compazine 10 mg every 6 hours as needed for nausea and vomiting.  The patient was advised to call immediately if she has any concerning symptoms in the interval. The patient voices understanding of current disease status and treatment options and is in agreement with the current care plan. All questions were answered. The patient knows to call the clinic with any problems, questions or concerns. We can certainly see the patient much sooner if necessary.

## 2018-07-03 ENCOUNTER — Inpatient Hospital Stay (HOSPITAL_BASED_OUTPATIENT_CLINIC_OR_DEPARTMENT_OTHER): Payer: PPO | Admitting: Oncology

## 2018-07-03 ENCOUNTER — Inpatient Hospital Stay: Payer: PPO

## 2018-07-03 ENCOUNTER — Encounter: Payer: Self-pay | Admitting: Oncology

## 2018-07-03 ENCOUNTER — Telehealth: Payer: Self-pay | Admitting: Oncology

## 2018-07-03 ENCOUNTER — Inpatient Hospital Stay: Payer: PPO | Attending: Internal Medicine

## 2018-07-03 VITALS — BP 139/63 | HR 66 | Temp 98.7°F | Resp 18 | Ht 71.0 in

## 2018-07-03 DIAGNOSIS — R110 Nausea: Secondary | ICD-10-CM | POA: Diagnosis not present

## 2018-07-03 DIAGNOSIS — C182 Malignant neoplasm of ascending colon: Secondary | ICD-10-CM

## 2018-07-03 DIAGNOSIS — C3431 Malignant neoplasm of lower lobe, right bronchus or lung: Secondary | ICD-10-CM | POA: Diagnosis not present

## 2018-07-03 DIAGNOSIS — Z5112 Encounter for antineoplastic immunotherapy: Secondary | ICD-10-CM | POA: Insufficient documentation

## 2018-07-03 DIAGNOSIS — C229 Malignant neoplasm of liver, not specified as primary or secondary: Secondary | ICD-10-CM

## 2018-07-03 DIAGNOSIS — C787 Secondary malignant neoplasm of liver and intrahepatic bile duct: Secondary | ICD-10-CM | POA: Diagnosis not present

## 2018-07-03 DIAGNOSIS — R5382 Chronic fatigue, unspecified: Secondary | ICD-10-CM

## 2018-07-03 DIAGNOSIS — Z79899 Other long term (current) drug therapy: Secondary | ICD-10-CM | POA: Insufficient documentation

## 2018-07-03 DIAGNOSIS — Z95828 Presence of other vascular implants and grafts: Secondary | ICD-10-CM

## 2018-07-03 LAB — CBC WITH DIFFERENTIAL (CANCER CENTER ONLY)
BASOS ABS: 0.1 10*3/uL (ref 0.0–0.1)
Basophils Relative: 1 %
EOS ABS: 0.2 10*3/uL (ref 0.0–0.5)
EOS PCT: 4 %
HCT: 41.3 % (ref 34.8–46.6)
Hemoglobin: 13.5 g/dL (ref 11.6–15.9)
LYMPHS PCT: 20 %
Lymphs Abs: 1 10*3/uL (ref 0.9–3.3)
MCH: 28.5 pg (ref 25.1–34.0)
MCHC: 32.6 g/dL (ref 31.5–36.0)
MCV: 87.2 fL (ref 79.5–101.0)
Monocytes Absolute: 0.3 10*3/uL (ref 0.1–0.9)
Monocytes Relative: 7 %
Neutro Abs: 3.4 10*3/uL (ref 1.5–6.5)
Neutrophils Relative %: 68 %
PLATELETS: 156 10*3/uL (ref 145–400)
RBC: 4.74 MIL/uL (ref 3.70–5.45)
RDW: 14.9 % — ABNORMAL HIGH (ref 11.2–14.5)
WBC: 5 10*3/uL (ref 3.9–10.3)

## 2018-07-03 LAB — CMP (CANCER CENTER ONLY)
ALBUMIN: 3.8 g/dL (ref 3.5–5.0)
ALK PHOS: 46 U/L (ref 38–126)
ALT: 14 U/L (ref 0–44)
AST: 16 U/L (ref 15–41)
Anion gap: 8 (ref 5–15)
BILIRUBIN TOTAL: 0.6 mg/dL (ref 0.3–1.2)
BUN: 19 mg/dL (ref 8–23)
CALCIUM: 9.7 mg/dL (ref 8.9–10.3)
CO2: 29 mmol/L (ref 22–32)
CREATININE: 1.44 mg/dL — AB (ref 0.44–1.00)
Chloride: 107 mmol/L (ref 98–111)
GFR, EST NON AFRICAN AMERICAN: 34 mL/min — AB (ref >60)
GFR, Est AFR Am: 40 mL/min — ABNORMAL LOW (ref >60)
GLUCOSE: 110 mg/dL — AB (ref 70–99)
Potassium: 4.5 mmol/L (ref 3.5–5.1)
Sodium: 144 mmol/L (ref 135–145)
TOTAL PROTEIN: 6.9 g/dL (ref 6.5–8.1)

## 2018-07-03 MED ORDER — SODIUM CHLORIDE 0.9 % IV SOLN
200.0000 mg | Freq: Once | INTRAVENOUS | Status: AC
  Administered 2018-07-03: 200 mg via INTRAVENOUS
  Filled 2018-07-03: qty 8

## 2018-07-03 MED ORDER — SODIUM CHLORIDE 0.9 % IV SOLN
Freq: Once | INTRAVENOUS | Status: AC
  Administered 2018-07-03: 15:00:00 via INTRAVENOUS

## 2018-07-03 MED ORDER — HEPARIN SOD (PORK) LOCK FLUSH 100 UNIT/ML IV SOLN
500.0000 [IU] | Freq: Once | INTRAVENOUS | Status: AC | PRN
  Administered 2018-07-03: 500 [IU]
  Filled 2018-07-03: qty 5

## 2018-07-03 MED ORDER — SODIUM CHLORIDE 0.9% FLUSH
10.0000 mL | INTRAVENOUS | Status: DC | PRN
  Administered 2018-07-03: 10 mL
  Filled 2018-07-03: qty 10

## 2018-07-03 MED ORDER — PROCHLORPERAZINE MALEATE 10 MG PO TABS: 10.0000 mg | ORAL_TABLET | Freq: Four times a day (QID) | ORAL | 0 refills | Status: DC | PRN

## 2018-07-03 NOTE — Telephone Encounter (Signed)
Scheduled appt per 7/8 los - gave patient AVS and calender per los.

## 2018-07-03 NOTE — Progress Notes (Signed)
Completed 07/03/18

## 2018-07-03 NOTE — Patient Instructions (Signed)
[facility] Discharge Instructions for Patients Receiving Chemotherapy  Today you received the following chemotherapy agents:  Keytruda.  To help prevent nausea and vomiting after your treatment, we encourage you to take your nausea medication as directed.   If you develop nausea and vomiting that is not controlled by your nausea medication, call the clinic.   BELOW ARE SYMPTOMS THAT SHOULD BE REPORTED IMMEDIATELY:  *FEVER GREATER THAN 100.5 F  *CHILLS WITH OR WITHOUT FEVER  NAUSEA AND VOMITING THAT IS NOT CONTROLLED WITH YOUR NAUSEA MEDICATION  *UNUSUAL SHORTNESS OF BREATH  *UNUSUAL BRUISING OR BLEEDING  TENDERNESS IN MOUTH AND THROAT WITH OR WITHOUT PRESENCE OF ULCERS  *URINARY PROBLEMS  *BOWEL PROBLEMS  UNUSUAL RASH Items with * indicate a potential emergency and should be followed up as soon as possible.  Feel free to call the clinic should you have any questions or concerns. The clinic phone number is [phone].  Please show the CHEMO ALERT CARD at check-in to the Emergency Department and triage nurse.    

## 2018-07-04 LAB — TSH: TSH: 3.516 u[IU]/mL (ref 0.308–3.960)

## 2018-07-14 ENCOUNTER — Ambulatory Visit: Payer: PPO | Admitting: Pharmacist Clinician (PhC)/ Clinical Pharmacy Specialist

## 2018-07-14 DIAGNOSIS — I4891 Unspecified atrial fibrillation: Secondary | ICD-10-CM

## 2018-07-14 LAB — COAGUCHEK XS/INR WAIVED
INR: 2 — ABNORMAL HIGH (ref 0.9–1.1)
Prothrombin Time: 24.4 s

## 2018-07-14 NOTE — Patient Instructions (Addendum)
  Description   Continue taking 2.5mg  a day except for 1/2 tablet on Friday  INR today was 2.0 (goal is 2-3)

## 2018-07-20 ENCOUNTER — Other Ambulatory Visit: Payer: PPO

## 2018-07-20 ENCOUNTER — Ambulatory Visit: Payer: PPO

## 2018-07-20 ENCOUNTER — Ambulatory Visit: Payer: PPO | Admitting: Internal Medicine

## 2018-07-27 ENCOUNTER — Inpatient Hospital Stay: Payer: PPO

## 2018-07-27 ENCOUNTER — Encounter: Payer: Self-pay | Admitting: Oncology

## 2018-07-27 ENCOUNTER — Other Ambulatory Visit: Payer: Self-pay

## 2018-07-27 ENCOUNTER — Inpatient Hospital Stay (HOSPITAL_BASED_OUTPATIENT_CLINIC_OR_DEPARTMENT_OTHER): Payer: PPO | Admitting: Oncology

## 2018-07-27 ENCOUNTER — Inpatient Hospital Stay: Payer: PPO | Attending: Internal Medicine

## 2018-07-27 ENCOUNTER — Telehealth: Payer: Self-pay | Admitting: Oncology

## 2018-07-27 VITALS — BP 150/79 | HR 64 | Temp 98.6°F | Resp 20 | Ht 71.0 in | Wt 283.6 lb

## 2018-07-27 DIAGNOSIS — M545 Low back pain: Secondary | ICD-10-CM | POA: Diagnosis not present

## 2018-07-27 DIAGNOSIS — C182 Malignant neoplasm of ascending colon: Secondary | ICD-10-CM

## 2018-07-27 DIAGNOSIS — C3431 Malignant neoplasm of lower lobe, right bronchus or lung: Secondary | ICD-10-CM

## 2018-07-27 DIAGNOSIS — J449 Chronic obstructive pulmonary disease, unspecified: Secondary | ICD-10-CM | POA: Diagnosis not present

## 2018-07-27 DIAGNOSIS — Z5112 Encounter for antineoplastic immunotherapy: Secondary | ICD-10-CM | POA: Insufficient documentation

## 2018-07-27 DIAGNOSIS — I10 Essential (primary) hypertension: Secondary | ICD-10-CM | POA: Insufficient documentation

## 2018-07-27 DIAGNOSIS — R5382 Chronic fatigue, unspecified: Secondary | ICD-10-CM

## 2018-07-27 DIAGNOSIS — C787 Secondary malignant neoplasm of liver and intrahepatic bile duct: Secondary | ICD-10-CM | POA: Diagnosis not present

## 2018-07-27 DIAGNOSIS — Z79899 Other long term (current) drug therapy: Secondary | ICD-10-CM | POA: Insufficient documentation

## 2018-07-27 DIAGNOSIS — Z95828 Presence of other vascular implants and grafts: Secondary | ICD-10-CM

## 2018-07-27 DIAGNOSIS — C229 Malignant neoplasm of liver, not specified as primary or secondary: Secondary | ICD-10-CM

## 2018-07-27 LAB — CMP (CANCER CENTER ONLY)
ALT: 11 U/L (ref 0–44)
ANION GAP: 9 (ref 5–15)
AST: 13 U/L — ABNORMAL LOW (ref 15–41)
Albumin: 3.6 g/dL (ref 3.5–5.0)
Alkaline Phosphatase: 59 U/L (ref 38–126)
BUN: 21 mg/dL (ref 8–23)
CALCIUM: 9.1 mg/dL (ref 8.9–10.3)
CHLORIDE: 107 mmol/L (ref 98–111)
CO2: 28 mmol/L (ref 22–32)
Creatinine: 1.3 mg/dL — ABNORMAL HIGH (ref 0.44–1.00)
GFR, EST AFRICAN AMERICAN: 45 mL/min — AB (ref >60)
GFR, Estimated: 39 mL/min — ABNORMAL LOW (ref >60)
GLUCOSE: 126 mg/dL — AB (ref 70–99)
POTASSIUM: 4.2 mmol/L (ref 3.5–5.1)
Sodium: 144 mmol/L (ref 135–145)
Total Bilirubin: 0.5 mg/dL (ref 0.3–1.2)
Total Protein: 6.7 g/dL (ref 6.5–8.1)

## 2018-07-27 LAB — CBC WITH DIFFERENTIAL (CANCER CENTER ONLY)
BASOS PCT: 1 %
Basophils Absolute: 0 10*3/uL (ref 0.0–0.1)
EOS PCT: 4 %
Eosinophils Absolute: 0.3 10*3/uL (ref 0.0–0.5)
HCT: 43.6 % (ref 34.8–46.6)
Hemoglobin: 13.6 g/dL (ref 11.6–15.9)
Lymphocytes Relative: 18 %
Lymphs Abs: 1.1 10*3/uL (ref 0.9–3.3)
MCH: 28.5 pg (ref 25.1–34.0)
MCHC: 31.2 g/dL — AB (ref 31.5–36.0)
MCV: 91.4 fL (ref 79.5–101.0)
MONO ABS: 0.5 10*3/uL (ref 0.1–0.9)
MONOS PCT: 8 %
Neutro Abs: 4.5 10*3/uL (ref 1.5–6.5)
Neutrophils Relative %: 69 %
PLATELETS: 163 10*3/uL (ref 145–400)
RBC: 4.77 MIL/uL (ref 3.70–5.45)
RDW: 14.3 % (ref 11.2–14.5)
WBC Count: 6.4 10*3/uL (ref 3.9–10.3)

## 2018-07-27 LAB — TSH: TSH: 4.685 u[IU]/mL — ABNORMAL HIGH (ref 0.308–3.960)

## 2018-07-27 MED ORDER — HEPARIN SOD (PORK) LOCK FLUSH 100 UNIT/ML IV SOLN
500.0000 [IU] | Freq: Once | INTRAVENOUS | Status: AC | PRN
  Administered 2018-07-27: 500 [IU]
  Filled 2018-07-27: qty 5

## 2018-07-27 MED ORDER — SODIUM CHLORIDE 0.9 % IV SOLN
200.0000 mg | Freq: Once | INTRAVENOUS | Status: AC
  Administered 2018-07-27: 200 mg via INTRAVENOUS
  Filled 2018-07-27: qty 8

## 2018-07-27 MED ORDER — SODIUM CHLORIDE 0.9% FLUSH
10.0000 mL | INTRAVENOUS | Status: DC | PRN
  Administered 2018-07-27: 10 mL
  Filled 2018-07-27: qty 10

## 2018-07-27 MED ORDER — SODIUM CHLORIDE 0.9 % IV SOLN
Freq: Once | INTRAVENOUS | Status: AC
  Administered 2018-07-27: 10:00:00 via INTRAVENOUS
  Filled 2018-07-27: qty 250

## 2018-07-27 NOTE — Telephone Encounter (Signed)
Appts already scheduled per 8/1 los -

## 2018-07-27 NOTE — Assessment & Plan Note (Signed)
[name] was multiple malignancies including history of breast cancer, history of lung cancer status post resection and most recently treated with metastatic colon adenocarcinoma with liver metastasis and MSI high. The patient is currently on treatment with Keytruda 200 mg IV every 3 weeks status post 27cycles.  The patient continues to tolerate this treatment well with the exception of fatigue. Recommend for her to proceed with cycle #28 of her treatment today as scheduled.  She will follow-up in 3 weeks for evaluation prior to cycle #29.  The patient was advised to call immediately if she has any concerning symptoms in the interval. The patient voices understanding of current disease status and treatment options and is in agreement with the current care plan. All questions were answered. The patient knows to call the clinic with any problems, questions or concerns. We can certainly see the patient much sooner if necessary.
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
## 2018-07-27 NOTE — Patient Instructions (Signed)
[facility] Discharge Instructions for Patients Receiving Chemotherapy  Today you received the following chemotherapy agents:  Keytruda.  To help prevent nausea and vomiting after your treatment, we encourage you to take your nausea medication as directed.   If you develop nausea and vomiting that is not controlled by your nausea medication, call the clinic.   BELOW ARE SYMPTOMS THAT SHOULD BE REPORTED IMMEDIATELY:  *FEVER GREATER THAN 100.5 F  *CHILLS WITH OR WITHOUT FEVER  NAUSEA AND VOMITING THAT IS NOT CONTROLLED WITH YOUR NAUSEA MEDICATION  *UNUSUAL SHORTNESS OF BREATH  *UNUSUAL BRUISING OR BLEEDING  TENDERNESS IN MOUTH AND THROAT WITH OR WITHOUT PRESENCE OF ULCERS  *URINARY PROBLEMS  *BOWEL PROBLEMS  UNUSUAL RASH Items with * indicate a potential emergency and should be followed up as soon as possible.  Feel free to call the clinic should you have any questions or concerns. The clinic phone number is [phone].  Please show the CHEMO ALERT CARD at check-in to the Emergency Department and triage nurse.    

## 2018-08-10 ENCOUNTER — Other Ambulatory Visit: Payer: PPO

## 2018-08-10 ENCOUNTER — Ambulatory Visit: Payer: PPO | Admitting: Internal Medicine

## 2018-08-10 ENCOUNTER — Ambulatory Visit: Payer: PPO
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
## 2018-08-16 NOTE — Assessment & Plan Note (Signed)
[name] was multiple malignancies including history of breast cancer, history of lung cancer status post resection and most recently treated with metastatic colon adenocarcinoma with liver metastasis and MSI high. The patient is currently on treatment with Keytruda 200 mg IV every 3 weeks status post 28cycles.  The patient continues to tolerate this treatment wellwith the exception of fatigue. Recommend for her to proceed with cycle #29 of her treatment today as scheduled.  She will follow-up in 3 weeks for evaluation prior to cycle #30.  The patient was advised to call immediately if she has any concerning symptoms in the interval. The patient voices understanding of current disease status and treatment options and is in agreement with the current care plan. All questions were answered. The patient knows to call the clinic with any problems, questions or concerns. We can certainly see the patient much sooner if necessary.

## 2018-08-17 ENCOUNTER — Encounter: Payer: Self-pay | Admitting: Oncology

## 2018-08-17 ENCOUNTER — Inpatient Hospital Stay (HOSPITAL_BASED_OUTPATIENT_CLINIC_OR_DEPARTMENT_OTHER): Payer: PPO | Admitting: Oncology

## 2018-08-17 ENCOUNTER — Inpatient Hospital Stay: Payer: PPO

## 2018-08-17 ENCOUNTER — Other Ambulatory Visit: Payer: PPO

## 2018-08-17 ENCOUNTER — Inpatient Hospital Stay (HOSPITAL_BASED_OUTPATIENT_CLINIC_OR_DEPARTMENT_OTHER): Payer: PPO | Admitting: *Deleted

## 2018-08-17 ENCOUNTER — Telehealth: Payer: Self-pay | Admitting: Oncology

## 2018-08-17 VITALS — BP 126/57 | HR 69 | Temp 98.2°F | Resp 19 | Ht 71.0 in | Wt 279.3 lb

## 2018-08-17 DIAGNOSIS — J449 Chronic obstructive pulmonary disease, unspecified: Secondary | ICD-10-CM

## 2018-08-17 DIAGNOSIS — C787 Secondary malignant neoplasm of liver and intrahepatic bile duct: Secondary | ICD-10-CM

## 2018-08-17 DIAGNOSIS — C3431 Malignant neoplasm of lower lobe, right bronchus or lung: Secondary | ICD-10-CM

## 2018-08-17 DIAGNOSIS — Z95828 Presence of other vascular implants and grafts: Secondary | ICD-10-CM

## 2018-08-17 DIAGNOSIS — Z79899 Other long term (current) drug therapy: Secondary | ICD-10-CM

## 2018-08-17 DIAGNOSIS — C229 Malignant neoplasm of liver, not specified as primary or secondary: Secondary | ICD-10-CM

## 2018-08-17 DIAGNOSIS — Z5112 Encounter for antineoplastic immunotherapy: Secondary | ICD-10-CM

## 2018-08-17 DIAGNOSIS — C182 Malignant neoplasm of ascending colon: Secondary | ICD-10-CM | POA: Diagnosis not present

## 2018-08-17 LAB — CBC WITH DIFFERENTIAL (CANCER CENTER ONLY)
BASOS ABS: 0 10*3/uL (ref 0.0–0.1)
Basophils Relative: 1 %
Eosinophils Absolute: 0.2 10*3/uL (ref 0.0–0.5)
Eosinophils Relative: 4 %
HCT: 45.2 % (ref 34.8–46.6)
HEMOGLOBIN: 14.2 g/dL (ref 11.6–15.9)
LYMPHS ABS: 0.9 10*3/uL (ref 0.9–3.3)
LYMPHS PCT: 19 %
MCH: 28.2 pg (ref 25.1–34.0)
MCHC: 31.4 g/dL — ABNORMAL LOW (ref 31.5–36.0)
MCV: 89.9 fL (ref 79.5–101.0)
Monocytes Absolute: 0.3 10*3/uL (ref 0.1–0.9)
Monocytes Relative: 6 %
NEUTROS PCT: 70 %
Neutro Abs: 3.6 10*3/uL (ref 1.5–6.5)
PLATELETS: 154 10*3/uL (ref 145–400)
RBC: 5.03 MIL/uL (ref 3.70–5.45)
RDW: 13.7 % (ref 11.2–14.5)
WBC: 5.1 10*3/uL (ref 3.9–10.3)

## 2018-08-17 LAB — CMP (CANCER CENTER ONLY)
ALK PHOS: 43 U/L (ref 38–126)
ALT: 14 U/L (ref 0–44)
AST: 16 U/L (ref 15–41)
Albumin: 3.7 g/dL (ref 3.5–5.0)
Anion gap: 7 (ref 5–15)
BUN: 20 mg/dL (ref 8–23)
CALCIUM: 9.6 mg/dL (ref 8.9–10.3)
CO2: 29 mmol/L (ref 22–32)
CREATININE: 1.43 mg/dL — AB (ref 0.44–1.00)
Chloride: 108 mmol/L (ref 98–111)
GFR, EST AFRICAN AMERICAN: 40 mL/min — AB (ref >60)
GFR, EST NON AFRICAN AMERICAN: 34 mL/min — AB (ref >60)
Glucose, Bld: 121 mg/dL — ABNORMAL HIGH (ref 70–99)
Potassium: 4.3 mmol/L (ref 3.5–5.1)
SODIUM: 144 mmol/L (ref 135–145)
Total Bilirubin: 0.5 mg/dL (ref 0.3–1.2)
Total Protein: 6.8 g/dL (ref 6.5–8.1)

## 2018-08-17 LAB — TSH: TSH: 3.274 u[IU]/mL (ref 0.308–3.960)

## 2018-08-17 MED ORDER — HEPARIN SOD (PORK) LOCK FLUSH 100 UNIT/ML IV SOLN
500.0000 [IU] | Freq: Once | INTRAVENOUS | Status: AC | PRN
  Administered 2018-08-17: 500 [IU]
  Filled 2018-08-17: qty 5

## 2018-08-17 MED ORDER — SODIUM CHLORIDE 0.9% FLUSH
10.0000 mL | INTRAVENOUS | Status: DC | PRN
  Filled 2018-08-17: qty 10

## 2018-08-17 MED ORDER — SODIUM CHLORIDE 0.9% FLUSH
10.0000 mL | INTRAVENOUS | Status: DC | PRN
  Administered 2018-08-17 (×2): 10 mL
  Filled 2018-08-17: qty 10

## 2018-08-17 MED ORDER — SODIUM CHLORIDE 0.9 % IV SOLN
Freq: Once | INTRAVENOUS | Status: AC
  Administered 2018-08-17: 12:00:00 via INTRAVENOUS
  Filled 2018-08-17: qty 250

## 2018-08-17 MED ORDER — SODIUM CHLORIDE 0.9 % IV SOLN
200.0000 mg | Freq: Once | INTRAVENOUS | Status: AC
  Administered 2018-08-17: 200 mg via INTRAVENOUS
  Filled 2018-08-17: qty 8

## 2018-08-17 NOTE — Patient Instructions (Signed)
[facility] Discharge Instructions for Patients Receiving Chemotherapy  Today you received the following chemotherapy agents pembrolizumab Beryle Flock)  To help prevent nausea and vomiting after your treatment, we encourage you to take your nausea medication as directed   If you develop nausea and vomiting that is not controlled by your nausea medication, call the clinic.   BELOW ARE SYMPTOMS THAT SHOULD BE REPORTED IMMEDIATELY:  *FEVER GREATER THAN 100.5 F  *CHILLS WITH OR WITHOUT FEVER  NAUSEA AND VOMITING THAT IS NOT CONTROLLED WITH YOUR NAUSEA MEDICATION  *UNUSUAL SHORTNESS OF BREATH  *UNUSUAL BRUISING OR BLEEDING  TENDERNESS IN MOUTH AND THROAT WITH OR WITHOUT PRESENCE OF ULCERS  *URINARY PROBLEMS  *BOWEL PROBLEMS  UNUSUAL RASH Items with * indicate a potential emergency and should be followed up as soon as possible.  Feel free to call the clinic should you have any questions or concerns. The clinic phone number is [phone].  Please show the [facility] at check-in to the Emergency Department and triage nurse.

## 2018-08-17 NOTE — Telephone Encounter (Signed)
Scheduled appt per 8/22 los - - gave patient AVS and calender per los. - unable to alternate appts due to patients ability to come in at certain time.

## 2018-08-31 ENCOUNTER — Ambulatory Visit (INDEPENDENT_AMBULATORY_CARE_PROVIDER_SITE_OTHER): Payer: PPO | Admitting: Pediatrics

## 2018-08-31 ENCOUNTER — Encounter: Payer: Self-pay | Admitting: Pediatrics

## 2018-08-31 VITALS — BP 136/81 | HR 60 | Temp 98.0°F | Ht 71.0 in | Wt 281.4 lb

## 2018-08-31 DIAGNOSIS — N183 Chronic kidney disease, stage 3 unspecified: Secondary | ICD-10-CM

## 2018-08-31 DIAGNOSIS — I482 Chronic atrial fibrillation, unspecified: Secondary | ICD-10-CM

## 2018-08-31 LAB — COAGUCHEK XS/INR WAIVED
INR: 2 — AB (ref 0.9–1.1)
PROTHROMBIN TIME: 24.2 s

## 2018-08-31 NOTE — Progress Notes (Signed)
  Subjective:   Patient ID: Tammie [name], female    DOB: [birth year], 77 y.o.   MRN: [id number] CC: Medical Management of Chronic Issues and Coagulation Disorder  HPI: Tammie [name] is a 77 y.o. female   Here today for follow-up INR. 2 weeks ago missed 1 evening of medicine but otherwise has been taking medicine regularly..  No bleeding, having regular bowel movements.  Relevant past medical, surgical, family and social history reviewed. Allergies and medications reviewed and updated. Social History   Tobacco Use  Smoking Status Former Smoker  . Packs/day: 3.00  . Years: 32.00  . Pack years: 96.00  . Types: Cigarettes  . Start date: 12/27/1989  . Last attempt to quit: 03/08/1990  . Years since quitting: 28.5  Smokeless Tobacco Former Systems developer  . Quit date: 03/08/1990  Tobacco Comment   smoked 3ppd from 1959-1991    ROS: Per HPI   Objective:    BP 136/81   Pulse 60   Temp 98 F (36.7 C) (Oral)   Ht 5\' 11"  (1.803 m)   Wt 281 lb 6.4 oz (127.6 kg)   BMI 39.25 kg/m   Wt Readings from Last 3 Encounters:  08/31/18 281 lb 6.4 oz (127.6 kg)  08/17/18 279 lb 4.8 oz (126.7 kg)  07/27/18 283 lb 9.6 oz (128.6 kg)    Gen: NAD, alert, cooperative with exam, NCAT EYES: EOMI, no conjunctival injection, or no icterus CV: NRRR, normal S1/S2 Resp: CTABL, no wheezes, normal WOB Abd: +BS, soft, NTND. no guarding or organomegaly Ext: No edema, warm Neuro: Alert and oriented, strength equal b/l UE and LE, coordination grossly normal MSK: normal muscle bulk  Assessment & Plan:  Tammie [name] was seen today for medical management of chronic issues and coagulation disorder.  Diagnoses and all orders for this visit:  Chronic atrial fibrillation (HCC) INR stable, 2.0.  Continue current dosing.  Needs INR in 4 weeks, may be getting at this at cancer center. -     CoaguChek XS/INR Waived  CKD (chronic kidney disease), stage III (HCC) Creatinine stable.   Follow up plan: Return in about 4 weeks  (around 09/28/2018). [name], MD [place]

## 2018-09-01 ENCOUNTER — Encounter: Payer: Self-pay | Admitting: Pediatrics

## 2018-09-07 ENCOUNTER — Other Ambulatory Visit: Payer: Self-pay | Admitting: Internal Medicine

## 2018-09-07 ENCOUNTER — Encounter: Payer: Self-pay | Admitting: Oncology

## 2018-09-07 ENCOUNTER — Inpatient Hospital Stay: Payer: PPO

## 2018-09-07 ENCOUNTER — Telehealth: Payer: Self-pay | Admitting: Oncology

## 2018-09-07 ENCOUNTER — Inpatient Hospital Stay: Payer: PPO | Attending: Internal Medicine

## 2018-09-07 ENCOUNTER — Inpatient Hospital Stay (HOSPITAL_BASED_OUTPATIENT_CLINIC_OR_DEPARTMENT_OTHER): Payer: PPO | Admitting: Oncology

## 2018-09-07 VITALS — BP 147/72 | HR 69 | Temp 98.3°F | Resp 17 | Ht 71.0 in | Wt 283.2 lb

## 2018-09-07 DIAGNOSIS — Z79899 Other long term (current) drug therapy: Secondary | ICD-10-CM | POA: Insufficient documentation

## 2018-09-07 DIAGNOSIS — C182 Malignant neoplasm of ascending colon: Secondary | ICD-10-CM | POA: Diagnosis not present

## 2018-09-07 DIAGNOSIS — Z853 Personal history of malignant neoplasm of breast: Secondary | ICD-10-CM

## 2018-09-07 DIAGNOSIS — C3431 Malignant neoplasm of lower lobe, right bronchus or lung: Secondary | ICD-10-CM | POA: Insufficient documentation

## 2018-09-07 DIAGNOSIS — I10 Essential (primary) hypertension: Secondary | ICD-10-CM | POA: Diagnosis not present

## 2018-09-07 DIAGNOSIS — Z5112 Encounter for antineoplastic immunotherapy: Secondary | ICD-10-CM

## 2018-09-07 DIAGNOSIS — C787 Secondary malignant neoplasm of liver and intrahepatic bile duct: Secondary | ICD-10-CM

## 2018-09-07 DIAGNOSIS — Z95828 Presence of other vascular implants and grafts: Secondary | ICD-10-CM

## 2018-09-07 DIAGNOSIS — C229 Malignant neoplasm of liver, not specified as primary or secondary: Secondary | ICD-10-CM

## 2018-09-07 LAB — CMP (CANCER CENTER ONLY)
ALK PHOS: 55 U/L (ref 38–126)
ALT: 10 U/L (ref 0–44)
ANION GAP: 10 (ref 5–15)
AST: 15 U/L (ref 15–41)
Albumin: 3.6 g/dL (ref 3.5–5.0)
BILIRUBIN TOTAL: 0.5 mg/dL (ref 0.3–1.2)
BUN: 25 mg/dL — ABNORMAL HIGH (ref 8–23)
CALCIUM: 9.3 mg/dL (ref 8.9–10.3)
CO2: 27 mmol/L (ref 22–32)
Chloride: 106 mmol/L (ref 98–111)
Creatinine: 1.44 mg/dL — ABNORMAL HIGH (ref 0.44–1.00)
GFR, EST AFRICAN AMERICAN: 39 mL/min — AB (ref >60)
GFR, EST NON AFRICAN AMERICAN: 34 mL/min — AB (ref >60)
GLUCOSE: 122 mg/dL — AB (ref 70–99)
POTASSIUM: 4.4 mmol/L (ref 3.5–5.1)
Sodium: 143 mmol/L (ref 135–145)
TOTAL PROTEIN: 6.9 g/dL (ref 6.5–8.1)

## 2018-09-07 LAB — TSH: TSH: 5.17 u[IU]/mL — ABNORMAL HIGH (ref 0.308–3.960)

## 2018-09-07 LAB — CBC WITH DIFFERENTIAL (CANCER CENTER ONLY)
BASOS PCT: 1 %
Basophils Absolute: 0 10*3/uL (ref 0.0–0.1)
EOS ABS: 0.2 10*3/uL (ref 0.0–0.5)
EOS PCT: 4 %
HEMATOCRIT: 44.8 % (ref 34.8–46.6)
Hemoglobin: 14.5 g/dL (ref 11.6–15.9)
Lymphocytes Relative: 18 %
Lymphs Abs: 1 10*3/uL (ref 0.9–3.3)
MCH: 28.2 pg (ref 25.1–34.0)
MCHC: 32.5 g/dL (ref 31.5–36.0)
MCV: 87 fL (ref 79.5–101.0)
Monocytes Absolute: 0.4 10*3/uL (ref 0.1–0.9)
Monocytes Relative: 7 %
NEUTROS ABS: 3.8 10*3/uL (ref 1.5–6.5)
NEUTROS PCT: 70 %
Platelet Count: 158 10*3/uL (ref 145–400)
RBC: 5.15 MIL/uL (ref 3.70–5.45)
RDW: 13.9 % (ref 11.2–14.5)
WBC Count: 5.5 10*3/uL (ref 3.9–10.3)

## 2018-09-07 MED ORDER — SODIUM CHLORIDE 0.9% FLUSH
10.0000 mL | INTRAVENOUS | Status: DC | PRN
  Filled 2018-09-07: qty 10

## 2018-09-07 MED ORDER — SODIUM CHLORIDE 0.9 % IV SOLN
Freq: Once | INTRAVENOUS | Status: AC
  Administered 2018-09-07: 11:00:00 via INTRAVENOUS
  Filled 2018-09-07: qty 250

## 2018-09-07 MED ORDER — SODIUM CHLORIDE 0.9% FLUSH
10.0000 mL | INTRAVENOUS | Status: DC | PRN
  Administered 2018-09-07: 10 mL
  Filled 2018-09-07: qty 10

## 2018-09-07 MED ORDER — SODIUM CHLORIDE 0.9 % IV SOLN
200.0000 mg | Freq: Once | INTRAVENOUS | Status: AC
  Administered 2018-09-07: 200 mg via INTRAVENOUS
  Filled 2018-09-07: qty 8

## 2018-09-07 MED ORDER — HEPARIN SOD (PORK) LOCK FLUSH 100 UNIT/ML IV SOLN
500.0000 [IU] | Freq: Once | INTRAVENOUS | Status: AC | PRN
  Administered 2018-09-07: 500 [IU]
  Filled 2018-09-07: qty 5

## 2018-09-07 NOTE — Patient Instructions (Signed)
Implanted Port Home Guide An implanted port is a type of central line that is placed under the skin. Central lines are used to provide IV access when treatment or nutrition needs to be given through a person's veins. Implanted ports are used for long-term IV access. An implanted port may be placed because:  You need IV medicine that would be irritating to the small veins in your hands or arms.  You need long-term IV medicines, such as antibiotics.  You need IV nutrition for a long period.  You need frequent blood draws for lab tests.  You need dialysis.  Implanted ports are usually placed in the chest area, but they can also be placed in the upper arm, the abdomen, or the leg. An implanted port has two main parts:  Reservoir. The reservoir is round and will appear as a small, raised area under your skin. The reservoir is the part where a needle is inserted to give medicines or draw blood.  Catheter. The catheter is a thin, flexible tube that extends from the reservoir. The catheter is placed into a large vein. Medicine that is inserted into the reservoir goes into the catheter and then into the vein.  How will I care for my incision site? Do not get the incision site wet. Bathe or shower as directed by your health care provider. How is my port accessed? Special steps must be taken to access the port:  Before the port is accessed, a numbing cream can be placed on the skin. This helps numb the skin over the port site.  Your health care provider uses a sterile technique to access the port. ? Your health care provider must put on a mask and sterile gloves. ? The skin over your port is cleaned carefully with an antiseptic and allowed to dry. ? The port is gently pinched between sterile gloves, and a needle is inserted into the port.  Only "non-coring" port needles should be used to access the port. Once the port is accessed, a blood return should be checked. This helps ensure that the port  is in the vein and is not clogged.  If your port needs to remain accessed for a constant infusion, a clear (transparent) bandage will be placed over the needle site. The bandage and needle will need to be changed every week, or as directed by your health care provider.  Keep the bandage covering the needle clean and dry. Do not get it wet. Follow your health care provider's instructions on how to take a shower or bath while the port is accessed.  If your port does not need to stay accessed, no bandage is needed over the port.  What is flushing? Flushing helps keep the port from getting clogged. Follow your health care provider's instructions on how and when to flush the port. Ports are usually flushed with saline solution or a medicine called heparin. The need for flushing will depend on how the port is used.  If the port is used for intermittent medicines or blood draws, the port will need to be flushed: ? After medicines have been given. ? After blood has been drawn. ? As part of routine maintenance.  If a constant infusion is running, the port may not need to be flushed.  How long will my port stay implanted? The port can stay in for as long as your health care provider thinks it is needed. When it is time for the port to come out, surgery will be   done to remove it. The procedure is similar to the one performed when the port was put in. When should I seek immediate medical care? When you have an implanted port, you should seek immediate medical care if:  You notice a bad smell coming from the incision site.  You have swelling, redness, or drainage at the incision site.  You have more swelling or pain at the port site or the surrounding area.  You have a fever that is not controlled with medicine.  This information is not intended to replace advice given to you by your health care provider. Make sure you discuss any questions you have with your health care provider. Document  Released: 12/13/2005 Document Revised: 05/20/2016 Document Reviewed: 08/20/2013 Elsevier Interactive Patient Education  2017 [employer]  
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
## 2018-09-07 NOTE — Assessment & Plan Note (Addendum)
This is a pleasant 77year old white female was multiple malignancies including history of breast cancer, history of lung cancer status post resection and most recently treated with metastatic colon adenocarcinoma with liver metastasis and MSI high. The patient is currently on treatment with Keytruda 200 mg IV every 3 weeks status post 29cycles.  The patient continues to tolerate this treatment wellwith the exception of fatigue and intermittent mouth sores. Recommend for her to proceed with cycle #30 of her treatment today as scheduled.  She will have a restaging CT scan of the chest, abdomen, pelvis prior to her next visit. She will follow-up in 3 weeks for evaluation prior to cycle #31 and to review her restaging CT scan results.  The patient was advised to call immediately if she has any concerning symptoms in the interval. The patient voices understanding of current disease status and treatment options and is in agreement with the current care plan. All questions were answered. The patient knows to call the clinic with any problems, questions or concerns. We can certainly see the patient much sooner if necessary.

## 2018-09-07 NOTE — Patient Instructions (Signed)
[facility] Discharge Instructions for Patients Receiving Chemotherapy  Today you received the following chemotherapy agents: Pembrolizumab Beryle Flock)  To help prevent nausea and vomiting after your treatment, we encourage you to take your nausea medication as directed.    If you develop nausea and vomiting that is not controlled by your nausea medication, call the clinic.   BELOW ARE SYMPTOMS THAT SHOULD BE REPORTED IMMEDIATELY:  *FEVER GREATER THAN 100.5 F  *CHILLS WITH OR WITHOUT FEVER  NAUSEA AND VOMITING THAT IS NOT CONTROLLED WITH YOUR NAUSEA MEDICATION  *UNUSUAL SHORTNESS OF BREATH  *UNUSUAL BRUISING OR BLEEDING  TENDERNESS IN MOUTH AND THROAT WITH OR WITHOUT PRESENCE OF ULCERS  *URINARY PROBLEMS  *BOWEL PROBLEMS  UNUSUAL RASH Items with * indicate a potential emergency and should be followed up as soon as possible.  Feel free to call the clinic should you have any questions or concerns. The clinic phone number is [phone].  Please show the [facility] at check-in to the Emergency Department and triage nurse.

## 2018-09-07 NOTE — Telephone Encounter (Signed)
Appts already scheduled per 9/12 los.

## 2018-09-17 IMAGING — CT CT CHEST W/O CM
2 of 4 series · 13 of 36 positions shown, 16 images · non-contrast
Comparison: PET-CT 12/09/2016.  Chest CT 09/14/2016.

CLINICAL DATA: Stage IA non-small cell lung cancer consistent with
adenocarcinoma diagnosed in August 2011.
TECHNIQUE: Multidetector CT imaging of the chest, abdomen and pelvis was
performed following the standard protocol without IV contrast.

[Series 2: cap w/o · axial · non-contrast · 0.98mm/px · z∈[-360,+174]mm · 10 of 129 slices shown, 13 images]
[im 11/129  mediastinal]
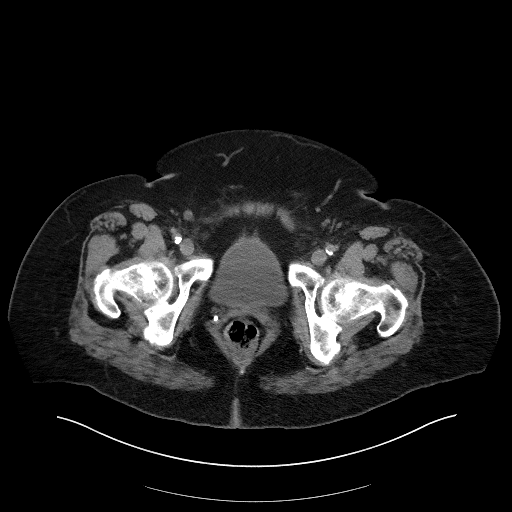
[im 11/129  lung]
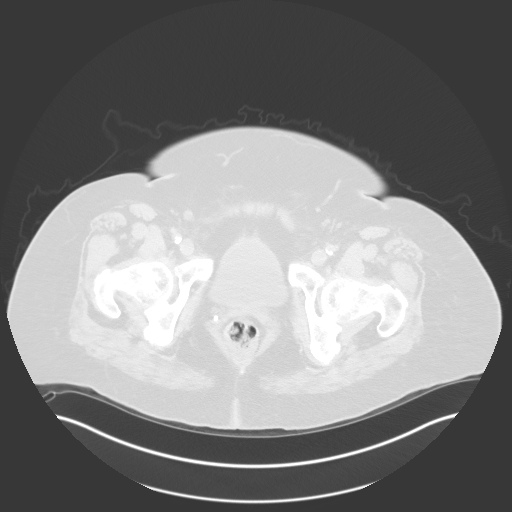
[im 22/129  lung]
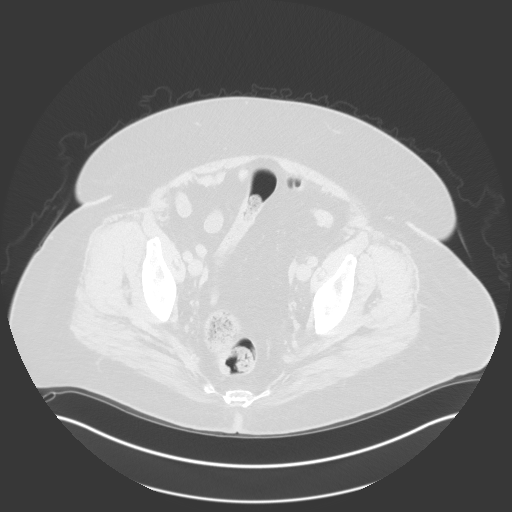
[im 33/129  lung]
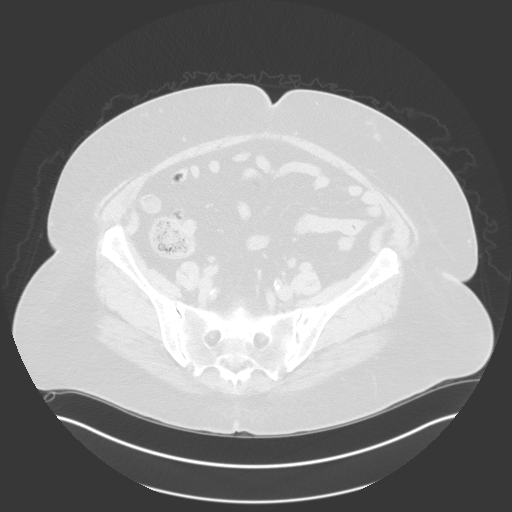
[im 43/129  lung]
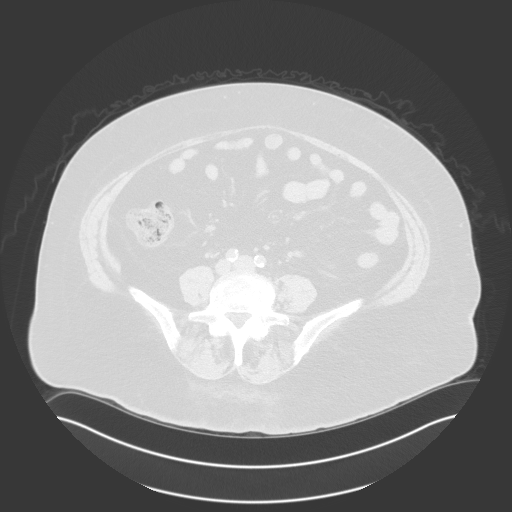
[im 54/129  mediastinal]
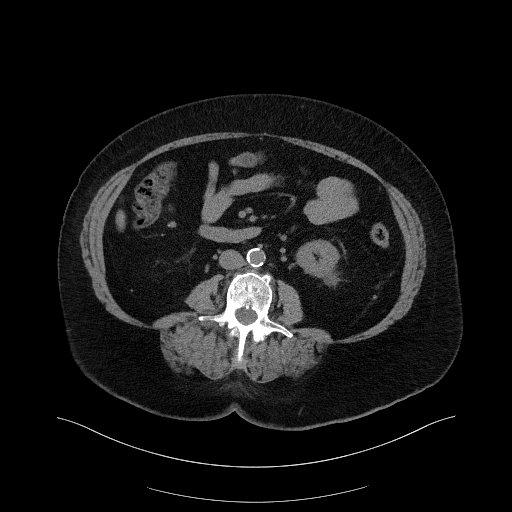
[im 54/129  lung]
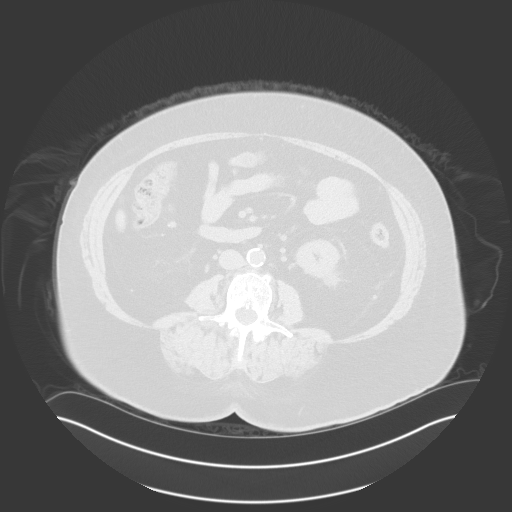
[im 75/129  lung]
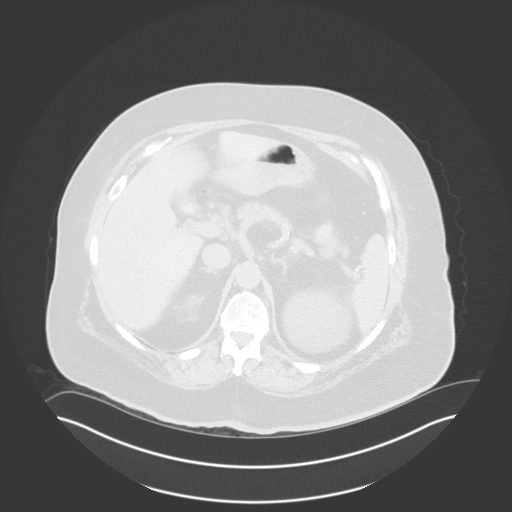
[im 86/129  lung]
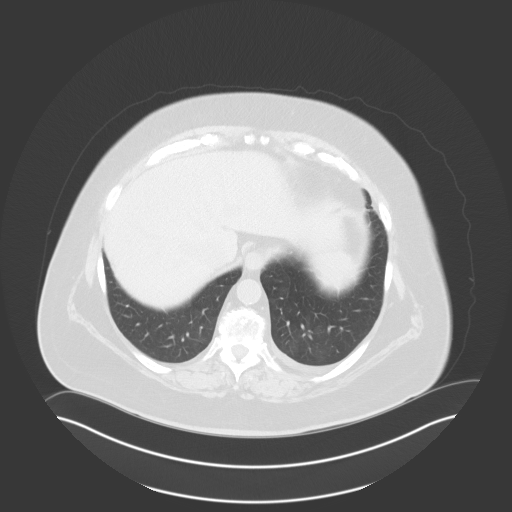
[im 97/129  lung]
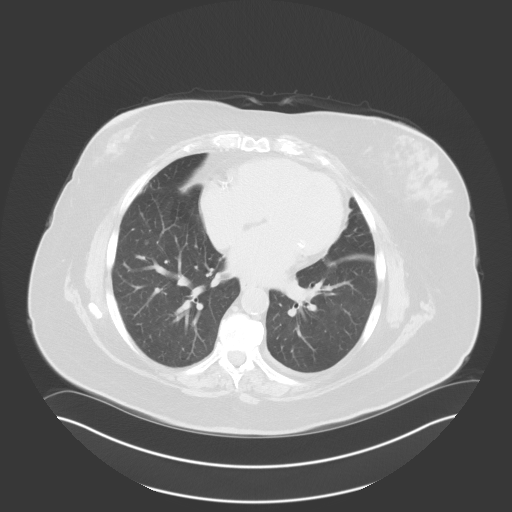
[im 107/129  mediastinal]
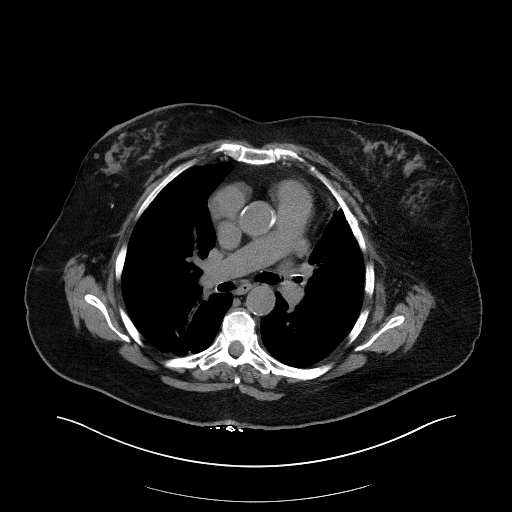
[im 107/129  lung]
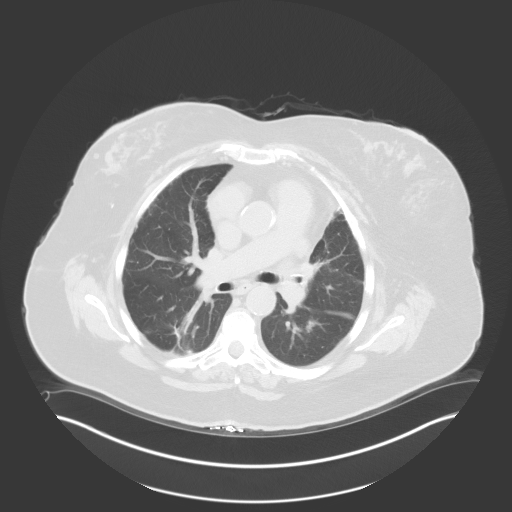
[im 118/129  lung]
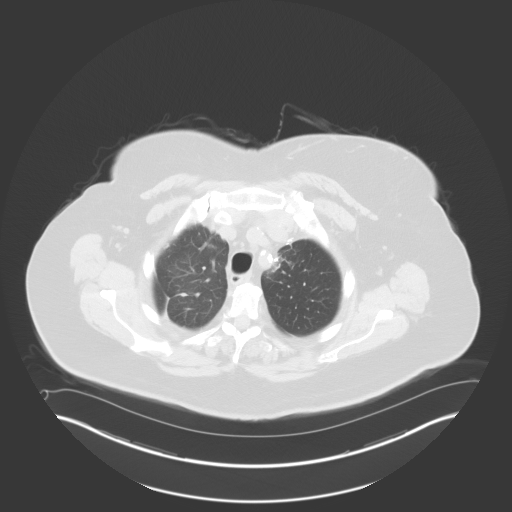

[Series 5: coronals · coronal · 0.82mm/px · 3 of 166 slices shown]
[im 34/166  lung]
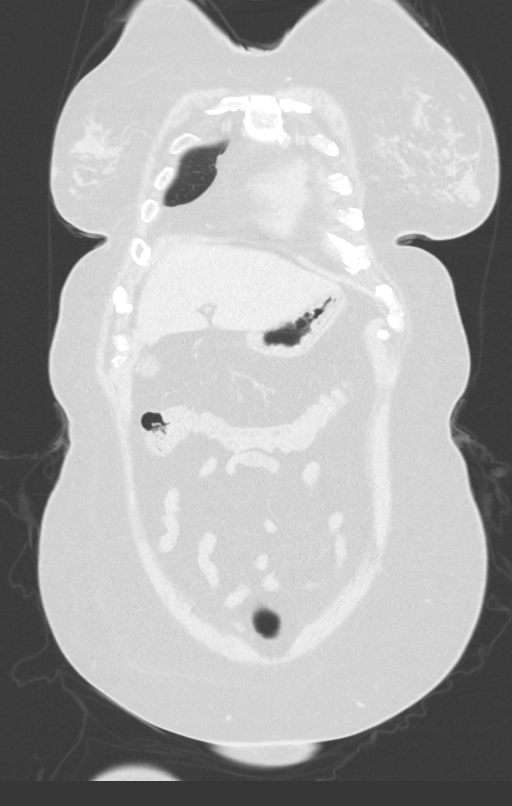
[im 67/166  lung]
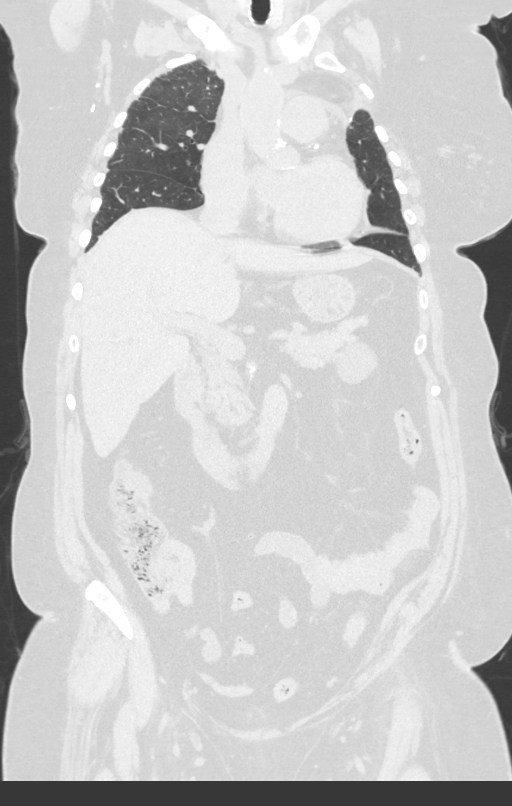
[im 100/166  lung]
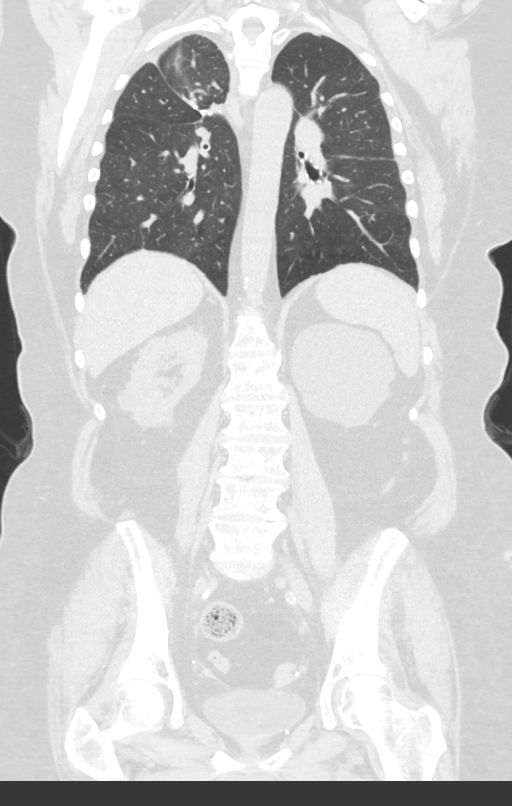

[13 of 36 positions shown; findings below may reference images not displayed]

Stage IA invasive ductal carcinoma, low grade in October 2013.

Stage IIIa non-small cell lung cancer, adenocarcinoma involving the
right upper and right lower lobes diagnosed in June 2014.

Metastatic adenocarcinoma of the liver of unknown primary
questionable for gastrointestinal versus pancreatic diagnosed in
September 2016.

EXAM:
CT CHEST, ABDOMEN AND PELVIS WITHOUT CONTRAST
FINDINGS: CT CHEST FINDINGS

Cardiovascular: Heart size upper normal. Coronary artery
calcification is noted. Atherosclerotic calcification is noted in
the wall of the thoracic aorta.

Mediastinum/Nodes: No mediastinal lymphadenopathy. No evidence for
gross hilar lymphadenopathy although assessment is limited by the
lack of intravenous contrast on today's study. The esophagus has
normal imaging features. There is no axillary lymphadenopathy.

Lungs/Pleura: Surgical changes again noted in both lungs with
posterior right lung scarring, stable. 10 mm left lower lobe
ground-glass nodule is stable (image 55). 8 mm posterior right lower
lobe nodule measured previously is 7 mm today (image 94). No new or
progressive findings.

Musculoskeletal: Bone windows reveal no worrisome lytic or sclerotic
osseous lesions.

CT ABDOMEN PELVIS FINDINGS

Hepatobiliary: 2.3 cm lesion identified medial segment left liver on
the previous PET-CT measures 1.4 cm today, but without intravenous
contrast, lesions not well demonstrated. Calcified gallstone again
noted. No intrahepatic or extrahepatic biliary dilation.

Pancreas: No focal mass lesion. No dilatation of the main duct. No
intraparenchymal cyst. No peripancreatic edema.

Spleen: No splenomegaly. No focal mass lesion.

Adrenals/Urinary Tract: No adrenal nodule or mass. Bilateral renal
cysts are again noted and nonobstructing stones identified in both
kidneys. No evidence for hydroureter. The urinary bladder appears
normal for the degree of distention.

Stomach/Bowel: Stomach is nondistended. No gastric wall thickening.
No evidence of outlet obstruction. Duodenum is normally positioned
as is the ligament of Treitz. No small bowel wall thickening. No
small bowel dilatation. The terminal ileum is normal. The appendix
is normal. No gross colonic mass. No colonic wall thickening. No
substantial diverticular change.

Vascular/Lymphatic: There is abdominal aortic atherosclerosis
without aneurysm. There is no gastrohepatic or hepatoduodenal
ligament lymphadenopathy. Hypermetabolic lymph node identified in
the hepatoduodenal ligament on the previous PET-CT, but no
lymphadenopathy in this region today. No intraperitoneal or
retroperitoneal lymphadenopathy. No pelvic sidewall lymphadenopathy.

Reproductive: Uterus surgically absent.  There is no adnexal mass.

Other: No intraperitoneal free fluid.

Musculoskeletal: Bone windows reveal no worrisome lytic or sclerotic
osseous lesions.
IMPRESSION: 1. Stable exam.  No new or progressive findings.
2. Left hepatic lesion measures slightly smaller on today's study.
3. The 2 sub solid nodules identified (1 each in the lower lobes)
previously are stable.
4. Cholelithiasis.
5.  Abdominal Aortic Atherosclerois ([id number]-170.0)

## 2018-09-26 ENCOUNTER — Other Ambulatory Visit: Payer: Self-pay | Admitting: Medical Oncology

## 2018-09-26 ENCOUNTER — Ambulatory Visit (HOSPITAL_COMMUNITY): Admission: RE | Admit: 2018-09-26 | Payer: PPO | Source: Ambulatory Visit

## 2018-09-26 ENCOUNTER — Encounter (HOSPITAL_COMMUNITY): Payer: Self-pay

## 2018-09-26 ENCOUNTER — Ambulatory Visit (HOSPITAL_COMMUNITY)
Admission: RE | Admit: 2018-09-26 | Discharge: 2018-09-26 | Disposition: A | Discharge status: Home / Self Care | Payer: PPO | Source: Ambulatory Visit | Attending: Internal Medicine | Admitting: Internal Medicine

## 2018-09-26 DIAGNOSIS — K8020 Calculus of gallbladder without cholecystitis without obstruction: Secondary | ICD-10-CM | POA: Insufficient documentation

## 2018-09-26 DIAGNOSIS — N200 Calculus of kidney: Secondary | ICD-10-CM | POA: Insufficient documentation

## 2018-09-26 DIAGNOSIS — C3431 Malignant neoplasm of lower lobe, right bronchus or lung: Secondary | ICD-10-CM

## 2018-09-26 DIAGNOSIS — C50911 Malignant neoplasm of unspecified site of right female breast: Secondary | ICD-10-CM | POA: Diagnosis not present

## 2018-09-26 DIAGNOSIS — I700 Atherosclerosis of aorta: Secondary | ICD-10-CM | POA: Diagnosis not present

## 2018-09-28 ENCOUNTER — Encounter: Payer: Self-pay | Admitting: Nurse Practitioner

## 2018-09-28 ENCOUNTER — Inpatient Hospital Stay: Payer: PPO

## 2018-09-28 ENCOUNTER — Telehealth: Payer: Self-pay | Admitting: Hematology

## 2018-09-28 ENCOUNTER — Inpatient Hospital Stay (HOSPITAL_BASED_OUTPATIENT_CLINIC_OR_DEPARTMENT_OTHER): Payer: PPO | Admitting: Nurse Practitioner

## 2018-09-28 ENCOUNTER — Inpatient Hospital Stay: Payer: PPO | Attending: Internal Medicine

## 2018-09-28 VITALS — BP 142/66 | HR 74 | Temp 98.4°F | Resp 17 | Ht 71.0 in | Wt 283.2 lb

## 2018-09-28 DIAGNOSIS — I252 Old myocardial infarction: Secondary | ICD-10-CM | POA: Insufficient documentation

## 2018-09-28 DIAGNOSIS — Z853 Personal history of malignant neoplasm of breast: Secondary | ICD-10-CM | POA: Insufficient documentation

## 2018-09-28 DIAGNOSIS — C3431 Malignant neoplasm of lower lobe, right bronchus or lung: Secondary | ICD-10-CM

## 2018-09-28 DIAGNOSIS — G629 Polyneuropathy, unspecified: Secondary | ICD-10-CM | POA: Diagnosis not present

## 2018-09-28 DIAGNOSIS — Z8673 Personal history of transient ischemic attack (TIA), and cerebral infarction without residual deficits: Secondary | ICD-10-CM | POA: Insufficient documentation

## 2018-09-28 DIAGNOSIS — E669 Obesity, unspecified: Secondary | ICD-10-CM

## 2018-09-28 DIAGNOSIS — J449 Chronic obstructive pulmonary disease, unspecified: Secondary | ICD-10-CM | POA: Insufficient documentation

## 2018-09-28 DIAGNOSIS — Z7901 Long term (current) use of anticoagulants: Secondary | ICD-10-CM | POA: Insufficient documentation

## 2018-09-28 DIAGNOSIS — Z79899 Other long term (current) drug therapy: Secondary | ICD-10-CM

## 2018-09-28 DIAGNOSIS — Z5112 Encounter for antineoplastic immunotherapy: Secondary | ICD-10-CM

## 2018-09-28 DIAGNOSIS — C787 Secondary malignant neoplasm of liver and intrahepatic bile duct: Secondary | ICD-10-CM | POA: Insufficient documentation

## 2018-09-28 DIAGNOSIS — I4891 Unspecified atrial fibrillation: Secondary | ICD-10-CM | POA: Insufficient documentation

## 2018-09-28 DIAGNOSIS — C182 Malignant neoplasm of ascending colon: Secondary | ICD-10-CM | POA: Insufficient documentation

## 2018-09-28 DIAGNOSIS — C189 Malignant neoplasm of colon, unspecified: Secondary | ICD-10-CM

## 2018-09-28 DIAGNOSIS — Z9981 Dependence on supplemental oxygen: Secondary | ICD-10-CM | POA: Insufficient documentation

## 2018-09-28 DIAGNOSIS — Z85118 Personal history of other malignant neoplasm of bronchus and lung: Secondary | ICD-10-CM

## 2018-09-28 DIAGNOSIS — I10 Essential (primary) hypertension: Secondary | ICD-10-CM | POA: Diagnosis not present

## 2018-09-28 DIAGNOSIS — C229 Malignant neoplasm of liver, not specified as primary or secondary: Secondary | ICD-10-CM

## 2018-09-28 LAB — CBC WITH DIFFERENTIAL (CANCER CENTER ONLY)
BASOS PCT: 1 %
Basophils Absolute: 0.1 10*3/uL (ref 0.0–0.1)
EOS ABS: 0.2 10*3/uL (ref 0.0–0.5)
Eosinophils Relative: 4 %
HEMATOCRIT: 43.7 % (ref 34.8–46.6)
HEMOGLOBIN: 14.2 g/dL (ref 11.6–15.9)
Lymphocytes Relative: 20 %
Lymphs Abs: 1 10*3/uL (ref 0.9–3.3)
MCH: 28.3 pg (ref 25.1–34.0)
MCHC: 32.5 g/dL (ref 31.5–36.0)
MCV: 87.1 fL (ref 79.5–101.0)
MONOS PCT: 8 %
Monocytes Absolute: 0.4 10*3/uL (ref 0.1–0.9)
NEUTROS ABS: 3.3 10*3/uL (ref 1.5–6.5)
NEUTROS PCT: 67 %
PLATELETS: 147 10*3/uL (ref 145–400)
RBC: 5.02 MIL/uL (ref 3.70–5.45)
RDW: 13.9 % (ref 11.2–14.5)
WBC: 4.8 10*3/uL (ref 3.9–10.3)

## 2018-09-28 LAB — CMP (CANCER CENTER ONLY)
ALK PHOS: 48 U/L (ref 38–126)
ALT: 12 U/L (ref 0–44)
ANION GAP: 7 (ref 5–15)
AST: 15 U/L (ref 15–41)
Albumin: 3.7 g/dL (ref 3.5–5.0)
BUN: 28 mg/dL — ABNORMAL HIGH (ref 8–23)
CALCIUM: 9.2 mg/dL (ref 8.9–10.3)
CHLORIDE: 109 mmol/L (ref 98–111)
CO2: 29 mmol/L (ref 22–32)
CREATININE: 1.52 mg/dL — AB (ref 0.44–1.00)
GFR, EST AFRICAN AMERICAN: 37 mL/min — AB (ref >60)
GFR, EST NON AFRICAN AMERICAN: 32 mL/min — AB (ref >60)
Glucose, Bld: 132 mg/dL — ABNORMAL HIGH (ref 70–99)
Potassium: 4.2 mmol/L (ref 3.5–5.1)
SODIUM: 145 mmol/L (ref 135–145)
Total Bilirubin: 0.5 mg/dL (ref 0.3–1.2)
Total Protein: 6.6 g/dL (ref 6.5–8.1)

## 2018-09-28 LAB — TSH: TSH: 2.772 u[IU]/mL (ref 0.308–3.960)

## 2018-09-28 MED ORDER — SODIUM CHLORIDE 0.9 % IV SOLN
200.0000 mg | Freq: Once | INTRAVENOUS | Status: AC
  Administered 2018-09-28: 200 mg via INTRAVENOUS
  Filled 2018-09-28: qty 8

## 2018-09-28 MED ORDER — HEPARIN SOD (PORK) LOCK FLUSH 100 UNIT/ML IV SOLN
500.0000 [IU] | Freq: Once | INTRAVENOUS | Status: AC | PRN
  Administered 2018-09-28: 500 [IU]
  Filled 2018-09-28: qty 5

## 2018-09-28 MED ORDER — SODIUM CHLORIDE 0.9% FLUSH
10.0000 mL | INTRAVENOUS | Status: DC | PRN
  Administered 2018-09-28: 10 mL
  Filled 2018-09-28: qty 10

## 2018-09-28 MED ORDER — SODIUM CHLORIDE 0.9 % IV SOLN
Freq: Once | INTRAVENOUS | Status: AC
  Administered 2018-09-28: 14:00:00 via INTRAVENOUS
  Filled 2018-09-28: qty 250

## 2018-09-28 NOTE — Telephone Encounter (Signed)
No los 10/3 °
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
## 2018-09-28 NOTE — Patient Instructions (Signed)
Pembrolizumab injection  What is this medicine?  PEMBROLIZUMAB (pem broe liz ue mab) is a monoclonal antibody. It is used to treat melanoma, head and neck cancer, Hodgkin lymphoma, non-small cell lung cancer, urothelial cancer, stomach cancer, and cancers that have a certain genetic condition.  This medicine may be used for other purposes; ask your health care provider or pharmacist if you have questions.  COMMON BRAND NAME(S): Keytruda  What should I tell my health care provider before I take this medicine?  They need to know if you have any of these conditions:  -diabetes  -immune system problems  -inflammatory bowel disease  -liver disease  -lung or breathing disease  -lupus  -organ transplant  -an unusual or allergic reaction to pembrolizumab, other medicines, foods, dyes, or preservatives  -pregnant or trying to get pregnant  -breast-feeding  How should I use this medicine?  This medicine is for infusion into a vein. It is given by a health care professional in a hospital or clinic setting.  A special MedGuide will be given to you before each treatment. Be sure to read this information carefully each time.  Talk to your pediatrician regarding the use of this medicine in children. While this drug may be prescribed for selected conditions, precautions do apply.  Overdosage: If you think you have taken too much of this medicine contact a poison control center or emergency room at once.  NOTE: This medicine is only for you. Do not share this medicine with others.  What if I miss a dose?  It is important not to miss your dose. Call your doctor or health care professional if you are unable to keep an appointment.  What may interact with this medicine?  Interactions have not been studied.  Give your health care provider a list of all the medicines, herbs, non-prescription drugs, or dietary supplements you use. Also tell them if you smoke, drink alcohol, or use illegal drugs. Some items may interact with your  medicine.  This list may not describe all possible interactions. Give your health care provider a list of all the medicines, herbs, non-prescription drugs, or dietary supplements you use. Also tell them if you smoke, drink alcohol, or use illegal drugs. Some items may interact with your medicine.  What should I watch for while using this medicine?  Your condition will be monitored carefully while you are receiving this medicine.  You may need blood work done while you are taking this medicine.  Do not become pregnant while taking this medicine or for 4 months after stopping it. Women should inform their doctor if they wish to become pregnant or think they might be pregnant. There is a potential for serious side effects to an unborn child. Talk to your health care professional or pharmacist for more information. Do not breast-feed an infant while taking this medicine or for 4 months after the last dose.  What side effects may I notice from receiving this medicine?  Side effects that you should report to your doctor or health care professional as soon as possible:  -allergic reactions like skin rash, itching or hives, swelling of the face, lips, or tongue  -bloody or black, tarry  -breathing problems  -changes in vision  -chest pain  -chills  -constipation  -cough  -dizziness or feeling faint or lightheaded  -fast or irregular heartbeat  -fever  -flushing  -hair loss  -low blood counts - this medicine may decrease the number of white blood cells, red blood cells   and platelets. You may be at increased risk for infections and bleeding.  -muscle pain  -muscle weakness  -persistent headache  -signs and symptoms of high blood sugar such as dizziness; dry mouth; dry skin; fruity breath; nausea; stomach pain; increased hunger or thirst; increased urination  -signs and symptoms of kidney injury like trouble passing urine or change in the amount of urine  -signs and symptoms of liver injury like dark urine, light-colored  stools, loss of appetite, nausea, right upper belly pain, yellowing of the eyes or skin  -stomach pain  -sweating  -weight loss  Side effects that usually do not require medical attention (report to your doctor or health care professional if they continue or are bothersome):  -decreased appetite  -diarrhea  -tiredness  This list may not describe all possible side effects. Call your doctor for medical advice about side effects. You may report side effects to FDA at 1-800-FDA-1088.  Where should I keep my medicine?  This drug is given in a hospital or clinic and will not be stored at home.  NOTE: This sheet is a summary. It may not cover all possible information. If you have questions about this medicine, talk to your doctor, pharmacist, or health care provider.   2018 Elsevier/Gold Standard (2016-09-21 12:29:36)

## 2018-09-29 ENCOUNTER — Encounter: Payer: Self-pay | Admitting: Pediatrics

## 2018-09-29 ENCOUNTER — Ambulatory Visit (INDEPENDENT_AMBULATORY_CARE_PROVIDER_SITE_OTHER): Payer: PPO | Admitting: Pediatrics

## 2018-09-29 VITALS — BP 136/83 | HR 63 | Temp 97.0°F | Ht 71.0 in | Wt 282.8 lb

## 2018-09-29 DIAGNOSIS — I4891 Unspecified atrial fibrillation: Secondary | ICD-10-CM | POA: Diagnosis not present

## 2018-09-29 DIAGNOSIS — Z7901 Long term (current) use of anticoagulants: Secondary | ICD-10-CM | POA: Diagnosis not present

## 2018-09-29 DIAGNOSIS — Z23 Encounter for immunization: Secondary | ICD-10-CM

## 2018-09-29 DIAGNOSIS — I482 Chronic atrial fibrillation, unspecified: Secondary | ICD-10-CM | POA: Diagnosis not present

## 2018-09-29 DIAGNOSIS — Z5181 Encounter for therapeutic drug level monitoring: Secondary | ICD-10-CM

## 2018-09-29 LAB — COAGUCHEK XS/INR WAIVED
INR: 2.2 — ABNORMAL HIGH (ref 0.9–1.1)
Prothrombin Time: 26.6 s

## 2018-09-29 NOTE — Progress Notes (Signed)
  Subjective:   Patient ID: Tammie [name], female    DOB: [birth year], 77 y.o.   MRN: [id number] CC: Anticoagulation  HPI: Tammie [name] is a 77 y.o. female   Anticoagulation for atrial fibrillation: Taking medicine regularly, no bleeding, no falls.  Has not missed any doses recently.  Relevant past medical, surgical, family and social history reviewed. Allergies and medications reviewed and updated. Social History   Tobacco Use  Smoking Status Former Smoker  . Packs/day: 3.00  . Years: 32.00  . Pack years: 96.00  . Types: Cigarettes  . Start date: 12/27/1989  . Last attempt to quit: 03/08/1990  . Years since quitting: 28.5  Smokeless Tobacco Former Systems developer  . Quit date: 03/08/1990  Tobacco Comment   smoked 3ppd from 1959-1991    ROS: Per HPI   Objective:    BP 136/83   Pulse 63   Temp (!) 97 F (36.1 C) (Oral)   Ht 5\' 11"  (1.803 m)   Wt 282 lb 12.8 oz (128.3 kg)   BMI 39.44 kg/m   Wt Readings from Last 3 Encounters:  09/29/18 282 lb 12.8 oz (128.3 kg)  09/28/18 283 lb 3.2 oz (128.5 kg)  09/07/18 283 lb 3.2 oz (128.5 kg)    Gen: NAD, alert, cooperative with exam, NCAT EYES: EOMI, no conjunctival injection, or no icterus CV: NRRR, normal S1/S2 Resp: CTABL, no wheezes, normal WOB Abd: +BS, soft, NTND. no guarding or organomegaly Ext: No edema, warm Neuro: Alert and oriented  Assessment & Plan:  [name] was seen today for anticoagulation.  Diagnoses and all orders for this visit:  Chronic atrial fibrillation INR goal, 2.2.  Continue current dosing.  See separate anticoagulation documentation.  Will need INR checked either at our office or with oncology within 4 weeks.  Patient aware. -     CoaguChek XS/INR Waived  Anticoagulation management encounter -     CoaguChek XS/INR Waived  Encounter for immunization -     Flu vaccine HIGH DOSE PF   Follow up plan: Return in about 4 weeks (around 10/27/2018). [name], MD [place]

## 2018-10-03 IMAGING — US IR US GUIDE VASC ACCESS RIGHT
1 series · 1 of 1 positions shown · non-contrast
Comparison: none

CLINICAL DATA: Adenocarcinoma of the liver, unknown primary, access
for chemotherapy

[Series 1: ir fluoro/shunt/fist · 1 of 1 slices shown]
[im 1/1]
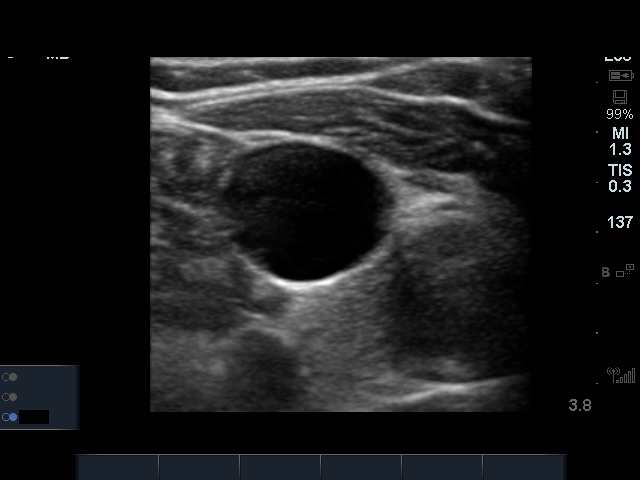

[1 of 1 positions shown; findings below may reference images not displayed]

EXAM:
RIGHT INTERNAL JUGULAR SINGLE LUMEN POWER PORT CATHETER INSERTION

Date:  [DATE] [DATE]

Radiologist:  Oun, Gustine

Guidance:  Ultrasound and fluoroscopic

MEDICATIONS:
3 g Ancef; The antibiotic was administered within an appropriate
time interval prior to skin puncture.

ANESTHESIA/SEDATION:
Versed 3.5 mg IV; Fentanyl 100 mcg IV;

Moderate Sedation Time:  23 minutes

The patient was continuously monitored during the procedure by the
interventional radiology nurse under my direct supervision.

FLUOROSCOPY TIME:  42 seconds (16 mGy)

COMPLICATIONS:
None immediate.

CONTRAST:  None.

PROCEDURE:
Informed consent was obtained from the patient following explanation
of the procedure, risks, benefits and alternatives. The patient
understands, agrees and consents for the procedure. All questions
were addressed. A time out was performed.

Maximal barrier sterile technique utilized including caps, mask,
sterile gowns, sterile gloves, large sterile drape, hand hygiene,
and 2% chlorhexidine scrub.

Under sterile conditions and local anesthesia, right internal
jugular micropuncture venous access was performed. Access was
performed with ultrasound. Images were obtained for documentation. A
guide wire was inserted followed by a transitional dilator. This
allowed insertion of a guide wire and catheter into the IVC.
Measurements were obtained from the SVC / RA junction back to the
right IJ venotomy site. In the right infraclavicular chest, a
subcutaneous pocket was created over the second anterior rib. This
was done under sterile conditions and local anesthesia. 1% lidocaine
with epinephrine was utilized for this. A 2.5 cm incision was made
in the skin. Blunt dissection was performed to create a subcutaneous
pocket over the right pectoralis major muscle. The pocket was
flushed with saline vigorously. There was adequate hemostasis. The
port catheter was assembled and checked for leakage. The port
catheter was secured in the pocket with two retention sutures. The
tubing was tunneled subcutaneously to the right venotomy site and
inserted into the SVC/RA junction through a valved peel-away sheath.
Position was confirmed with fluoroscopy. Images were obtained for
documentation. The patient tolerated the procedure well. No
immediate complications. Incisions were closed in a two layer
fashion with 4 - 0 Vicryl suture. Dermabond was applied to the skin.
The port catheter was accessed, blood was aspirated followed by
saline and heparin flushes. Needle was removed. A dry sterile
dressing was applied.
IMPRESSION: Ultrasound and fluoroscopically guided right internal jugular single
lumen power port catheter insertion. Tip in the SVC/RA junction.
Catheter ready for use.

## 2018-10-13 ENCOUNTER — Telehealth: Payer: Self-pay | Admitting: Internal Medicine

## 2018-10-13 NOTE — Telephone Encounter (Signed)
MM PAL 11/14 - moved appointments to 11/13 with [place]. Spoke with patient.

## 2018-10-19 ENCOUNTER — Inpatient Hospital Stay (HOSPITAL_BASED_OUTPATIENT_CLINIC_OR_DEPARTMENT_OTHER): Payer: PPO | Admitting: Internal Medicine

## 2018-10-19 ENCOUNTER — Inpatient Hospital Stay: Payer: PPO

## 2018-10-19 ENCOUNTER — Encounter: Payer: Self-pay | Admitting: Internal Medicine

## 2018-10-19 ENCOUNTER — Telehealth: Payer: Self-pay | Admitting: Internal Medicine

## 2018-10-19 VITALS — BP 147/82 | HR 63 | Temp 98.6°F | Resp 18 | Wt 284.4 lb

## 2018-10-19 DIAGNOSIS — J449 Chronic obstructive pulmonary disease, unspecified: Secondary | ICD-10-CM | POA: Diagnosis not present

## 2018-10-19 DIAGNOSIS — Z7901 Long term (current) use of anticoagulants: Secondary | ICD-10-CM

## 2018-10-19 DIAGNOSIS — C3431 Malignant neoplasm of lower lobe, right bronchus or lung: Secondary | ICD-10-CM | POA: Diagnosis not present

## 2018-10-19 DIAGNOSIS — C182 Malignant neoplasm of ascending colon: Secondary | ICD-10-CM

## 2018-10-19 DIAGNOSIS — G629 Polyneuropathy, unspecified: Secondary | ICD-10-CM | POA: Diagnosis not present

## 2018-10-19 DIAGNOSIS — C229 Malignant neoplasm of liver, not specified as primary or secondary: Secondary | ICD-10-CM

## 2018-10-19 DIAGNOSIS — E669 Obesity, unspecified: Secondary | ICD-10-CM

## 2018-10-19 DIAGNOSIS — Z5112 Encounter for antineoplastic immunotherapy: Secondary | ICD-10-CM | POA: Diagnosis not present

## 2018-10-19 DIAGNOSIS — I4891 Unspecified atrial fibrillation: Secondary | ICD-10-CM

## 2018-10-19 DIAGNOSIS — C787 Secondary malignant neoplasm of liver and intrahepatic bile duct: Secondary | ICD-10-CM | POA: Diagnosis not present

## 2018-10-19 DIAGNOSIS — Z95828 Presence of other vascular implants and grafts: Secondary | ICD-10-CM

## 2018-10-19 DIAGNOSIS — C189 Malignant neoplasm of colon, unspecified: Secondary | ICD-10-CM

## 2018-10-19 DIAGNOSIS — Z9981 Dependence on supplemental oxygen: Secondary | ICD-10-CM | POA: Diagnosis not present

## 2018-10-19 LAB — CBC WITH DIFFERENTIAL (CANCER CENTER ONLY)
Abs Immature Granulocytes: 0.02 10*3/uL (ref 0.00–0.07)
BASOS ABS: 0 10*3/uL (ref 0.0–0.1)
BASOS PCT: 1 %
EOS PCT: 4 %
Eosinophils Absolute: 0.3 10*3/uL (ref 0.0–0.5)
HCT: 46.4 % — ABNORMAL HIGH (ref 36.0–46.0)
Hemoglobin: 14.5 g/dL (ref 12.0–15.0)
Immature Granulocytes: 0 %
Lymphocytes Relative: 19 %
Lymphs Abs: 1.2 10*3/uL (ref 0.7–4.0)
MCH: 27.9 pg (ref 26.0–34.0)
MCHC: 31.3 g/dL (ref 30.0–36.0)
MCV: 89.4 fL (ref 80.0–100.0)
Monocytes Absolute: 0.5 10*3/uL (ref 0.1–1.0)
Monocytes Relative: 9 %
NRBC: 0 % (ref 0.0–0.2)
Neutro Abs: 4.1 10*3/uL (ref 1.7–7.7)
Neutrophils Relative %: 67 %
PLATELETS: 161 10*3/uL (ref 150–400)
RBC: 5.19 MIL/uL — AB (ref 3.87–5.11)
RDW: 13.3 % (ref 11.5–15.5)
WBC: 6.1 10*3/uL (ref 4.0–10.5)

## 2018-10-19 LAB — CMP (CANCER CENTER ONLY)
ALK PHOS: 54 U/L (ref 38–126)
ALT: 13 U/L (ref 0–44)
ANION GAP: 10 (ref 5–15)
AST: 15 U/L (ref 15–41)
Albumin: 3.5 g/dL (ref 3.5–5.0)
BUN: 23 mg/dL (ref 8–23)
CO2: 24 mmol/L (ref 22–32)
CREATININE: 1.42 mg/dL — AB (ref 0.44–1.00)
Calcium: 9.3 mg/dL (ref 8.9–10.3)
Chloride: 108 mmol/L (ref 98–111)
GFR, EST AFRICAN AMERICAN: 40 mL/min — AB (ref >60)
GFR, EST NON AFRICAN AMERICAN: 35 mL/min — AB (ref >60)
Glucose, Bld: 103 mg/dL — ABNORMAL HIGH (ref 70–99)
Potassium: 5 mmol/L (ref 3.5–5.1)
Sodium: 142 mmol/L (ref 135–145)
Total Bilirubin: 0.5 mg/dL (ref 0.3–1.2)
Total Protein: 6.7 g/dL (ref 6.5–8.1)

## 2018-10-19 LAB — TSH: TSH: 5.808 u[IU]/mL — AB (ref 0.308–3.960)

## 2018-10-19 MED ORDER — HEPARIN SOD (PORK) LOCK FLUSH 100 UNIT/ML IV SOLN
500.0000 [IU] | Freq: Once | INTRAVENOUS | Status: AC | PRN
  Administered 2018-10-19: 500 [IU]
  Filled 2018-10-19: qty 5

## 2018-10-19 MED ORDER — SODIUM CHLORIDE 0.9% FLUSH
10.0000 mL | INTRAVENOUS | Status: DC | PRN
  Administered 2018-10-19: 10 mL
  Filled 2018-10-19: qty 10

## 2018-10-19 MED ORDER — SODIUM CHLORIDE 0.9 % IV SOLN
200.0000 mg | Freq: Once | INTRAVENOUS | Status: AC
  Administered 2018-10-19: 200 mg via INTRAVENOUS
  Filled 2018-10-19: qty 8

## 2018-10-19 MED ORDER — SODIUM CHLORIDE 0.9 % IV SOLN
Freq: Once | INTRAVENOUS | Status: AC
  Administered 2018-10-19: 11:00:00 via INTRAVENOUS
  Filled 2018-10-19: qty 250
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
## 2018-10-19 NOTE — Telephone Encounter (Signed)
Scheduled appt per 10/24 los - gave patient AVS and calender per los . 

## 2018-10-19 NOTE — Patient Instructions (Signed)
[facility] Discharge Instructions for Patients Receiving Chemotherapy  Today you received the following chemotherapy agents: Pembrolizumab Beryle Flock)  To help prevent nausea and vomiting after your treatment, we encourage you to take your nausea medication as directed.    If you develop nausea and vomiting that is not controlled by your nausea medication, call the clinic.   BELOW ARE SYMPTOMS THAT SHOULD BE REPORTED IMMEDIATELY:  *FEVER GREATER THAN 100.5 F  *CHILLS WITH OR WITHOUT FEVER  NAUSEA AND VOMITING THAT IS NOT CONTROLLED WITH YOUR NAUSEA MEDICATION  *UNUSUAL SHORTNESS OF BREATH  *UNUSUAL BRUISING OR BLEEDING  TENDERNESS IN MOUTH AND THROAT WITH OR WITHOUT PRESENCE OF ULCERS  *URINARY PROBLEMS  *BOWEL PROBLEMS  UNUSUAL RASH Items with * indicate a potential emergency and should be followed up as soon as possible.  Feel free to call the clinic should you have any questions or concerns. The clinic phone number is [phone].  Please show the [facility] at check-in to the Emergency Department and triage nurse.

## 2018-11-01 ENCOUNTER — Ambulatory Visit: Payer: PPO | Admitting: Pediatrics

## 2018-11-08 ENCOUNTER — Inpatient Hospital Stay (HOSPITAL_BASED_OUTPATIENT_CLINIC_OR_DEPARTMENT_OTHER): Payer: PPO | Admitting: Oncology

## 2018-11-08 ENCOUNTER — Encounter: Payer: Self-pay | Admitting: Oncology

## 2018-11-08 ENCOUNTER — Inpatient Hospital Stay: Payer: PPO

## 2018-11-08 ENCOUNTER — Other Ambulatory Visit: Payer: Self-pay | Admitting: Internal Medicine

## 2018-11-08 ENCOUNTER — Inpatient Hospital Stay: Payer: PPO | Attending: Internal Medicine

## 2018-11-08 VITALS — BP 155/70 | HR 64 | Temp 98.4°F | Resp 22

## 2018-11-08 DIAGNOSIS — C229 Malignant neoplasm of liver, not specified as primary or secondary: Secondary | ICD-10-CM

## 2018-11-08 DIAGNOSIS — Z79899 Other long term (current) drug therapy: Secondary | ICD-10-CM | POA: Diagnosis not present

## 2018-11-08 DIAGNOSIS — R300 Dysuria: Secondary | ICD-10-CM

## 2018-11-08 DIAGNOSIS — C787 Secondary malignant neoplasm of liver and intrahepatic bile duct: Secondary | ICD-10-CM | POA: Diagnosis not present

## 2018-11-08 DIAGNOSIS — J449 Chronic obstructive pulmonary disease, unspecified: Secondary | ICD-10-CM | POA: Insufficient documentation

## 2018-11-08 DIAGNOSIS — Z5112 Encounter for antineoplastic immunotherapy: Secondary | ICD-10-CM | POA: Insufficient documentation

## 2018-11-08 DIAGNOSIS — Z9981 Dependence on supplemental oxygen: Secondary | ICD-10-CM | POA: Insufficient documentation

## 2018-11-08 DIAGNOSIS — Z171 Estrogen receptor negative status [ER-]: Secondary | ICD-10-CM

## 2018-11-08 DIAGNOSIS — I10 Essential (primary) hypertension: Secondary | ICD-10-CM | POA: Diagnosis not present

## 2018-11-08 DIAGNOSIS — Z7901 Long term (current) use of anticoagulants: Secondary | ICD-10-CM | POA: Insufficient documentation

## 2018-11-08 DIAGNOSIS — C3431 Malignant neoplasm of lower lobe, right bronchus or lung: Secondary | ICD-10-CM

## 2018-11-08 DIAGNOSIS — E6609 Other obesity due to excess calories: Secondary | ICD-10-CM | POA: Diagnosis not present

## 2018-11-08 DIAGNOSIS — E669 Obesity, unspecified: Secondary | ICD-10-CM | POA: Diagnosis not present

## 2018-11-08 DIAGNOSIS — N390 Urinary tract infection, site not specified: Secondary | ICD-10-CM

## 2018-11-08 LAB — CBC WITH DIFFERENTIAL (CANCER CENTER ONLY)
ABS IMMATURE GRANULOCYTES: 0.01 10*3/uL (ref 0.00–0.07)
BASOS PCT: 1 %
Basophils Absolute: 0.1 10*3/uL (ref 0.0–0.1)
EOS ABS: 0.2 10*3/uL (ref 0.0–0.5)
EOS PCT: 4 %
HEMATOCRIT: 46.8 % — AB (ref 36.0–46.0)
Hemoglobin: 14.4 g/dL (ref 12.0–15.0)
IMMATURE GRANULOCYTES: 0 %
LYMPHS ABS: 1 10*3/uL (ref 0.7–4.0)
Lymphocytes Relative: 15 %
MCH: 27.6 pg (ref 26.0–34.0)
MCHC: 30.8 g/dL (ref 30.0–36.0)
MCV: 89.8 fL (ref 80.0–100.0)
Monocytes Absolute: 0.5 10*3/uL (ref 0.1–1.0)
Monocytes Relative: 8 %
NEUTROS PCT: 72 %
NRBC: 0 % (ref 0.0–0.2)
Neutro Abs: 4.6 10*3/uL (ref 1.7–7.7)
Platelet Count: 176 10*3/uL (ref 150–400)
RBC: 5.21 MIL/uL — ABNORMAL HIGH (ref 3.87–5.11)
RDW: 13.9 % (ref 11.5–15.5)
WBC Count: 6.4 10*3/uL (ref 4.0–10.5)

## 2018-11-08 LAB — URINALYSIS, COMPLETE (UACMP) WITH MICROSCOPIC
Bilirubin Urine: NEGATIVE
Glucose, UA: NEGATIVE mg/dL
KETONES UR: NEGATIVE mg/dL
Nitrite: POSITIVE — AB
PROTEIN: NEGATIVE mg/dL
Specific Gravity, Urine: 1.016 (ref 1.005–1.030)
pH: 5 (ref 5.0–8.0)

## 2018-11-08 LAB — CMP (CANCER CENTER ONLY)
ALK PHOS: 59 U/L (ref 38–126)
ALT: 9 U/L (ref 0–44)
ANION GAP: 8 (ref 5–15)
AST: 14 U/L — ABNORMAL LOW (ref 15–41)
Albumin: 3.4 g/dL — ABNORMAL LOW (ref 3.5–5.0)
BUN: 26 mg/dL — ABNORMAL HIGH (ref 8–23)
CALCIUM: 8.9 mg/dL (ref 8.9–10.3)
CO2: 26 mmol/L (ref 22–32)
CREATININE: 1.49 mg/dL — AB (ref 0.44–1.00)
Chloride: 109 mmol/L (ref 98–111)
GFR, EST AFRICAN AMERICAN: 38 mL/min — AB (ref >60)
GFR, Estimated: 33 mL/min — ABNORMAL LOW (ref >60)
Glucose, Bld: 105 mg/dL — ABNORMAL HIGH (ref 70–99)
Potassium: 5.2 mmol/L — ABNORMAL HIGH (ref 3.5–5.1)
SODIUM: 143 mmol/L (ref 135–145)
TOTAL PROTEIN: 6.8 g/dL (ref 6.5–8.1)
Total Bilirubin: 0.4 mg/dL (ref 0.3–1.2)

## 2018-11-08 LAB — TSH: TSH: 3.532 u[IU]/mL (ref 0.308–3.960)

## 2018-11-08 MED ORDER — HEPARIN SOD (PORK) LOCK FLUSH 100 UNIT/ML IV SOLN
500.0000 [IU] | Freq: Once | INTRAVENOUS | Status: AC | PRN
  Administered 2018-11-08: 500 [IU]
  Filled 2018-11-08: qty 5

## 2018-11-08 MED ORDER — SODIUM CHLORIDE 0.9% FLUSH
10.0000 mL | INTRAVENOUS | Status: DC | PRN
  Administered 2018-11-08: 10 mL
  Filled 2018-11-08: qty 10

## 2018-11-08 MED ORDER — CIPROFLOXACIN HCL 500 MG PO TABS: 500.0000 mg | ORAL_TABLET | Freq: Two times a day (BID) | ORAL | 0 refills | Status: DC

## 2018-11-08 MED ORDER — SODIUM CHLORIDE 0.9 % IV SOLN
Freq: Once | INTRAVENOUS | Status: AC
  Administered 2018-11-08: 14:00:00 via INTRAVENOUS
  Filled 2018-11-08: qty 250

## 2018-11-08 MED ORDER — SODIUM CHLORIDE 0.9 % IV SOLN
200.0000 mg | Freq: Once | INTRAVENOUS | Status: AC
  Administered 2018-11-08: 200 mg via INTRAVENOUS
  Filled 2018-11-08: qty 8
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
## 2018-11-08 NOTE — Patient Instructions (Signed)
[facility] Discharge Instructions for Patients Receiving Chemotherapy  Today you received the following chemotherapy agents: Pembrolizumab Beryle Flock)  To help prevent nausea and vomiting after your treatment, we encourage you to take your nausea medication as directed.    If you develop nausea and vomiting that is not controlled by your nausea medication, call the clinic.   BELOW ARE SYMPTOMS THAT SHOULD BE REPORTED IMMEDIATELY:  *FEVER GREATER THAN 100.5 F  *CHILLS WITH OR WITHOUT FEVER  NAUSEA AND VOMITING THAT IS NOT CONTROLLED WITH YOUR NAUSEA MEDICATION  *UNUSUAL SHORTNESS OF BREATH  *UNUSUAL BRUISING OR BLEEDING  TENDERNESS IN MOUTH AND THROAT WITH OR WITHOUT PRESENCE OF ULCERS  *URINARY PROBLEMS  *BOWEL PROBLEMS  UNUSUAL RASH Items with * indicate a potential emergency and should be followed up as soon as possible.  Feel free to call the clinic should you have any questions or concerns. The clinic phone number is [phone].  Please show the [facility] at check-in to the Emergency Department and triage nurse.

## 2018-11-08 NOTE — Patient Instructions (Signed)
Implanted Port Home Guide An implanted port is a type of central line that is placed under the skin. Central lines are used to provide IV access when treatment or nutrition needs to be given through a person's veins. Implanted ports are used for long-term IV access. An implanted port may be placed because:  You need IV medicine that would be irritating to the small veins in your hands or arms.  You need long-term IV medicines, such as antibiotics.  You need IV nutrition for a long period.  You need frequent blood draws for lab tests.  You need dialysis.  Implanted ports are usually placed in the chest area, but they can also be placed in the upper arm, the abdomen, or the leg. An implanted port has two main parts:  Reservoir. The reservoir is round and will appear as a small, raised area under your skin. The reservoir is the part where a needle is inserted to give medicines or draw blood.  Catheter. The catheter is a thin, flexible tube that extends from the reservoir. The catheter is placed into a large vein. Medicine that is inserted into the reservoir goes into the catheter and then into the vein.  How will I care for my incision site? Do not get the incision site wet. Bathe or shower as directed by your health care provider. How is my port accessed? Special steps must be taken to access the port:  Before the port is accessed, a numbing cream can be placed on the skin. This helps numb the skin over the port site.  Your health care provider uses a sterile technique to access the port. ? Your health care provider must put on a mask and sterile gloves. ? The skin over your port is cleaned carefully with an antiseptic and allowed to dry. ? The port is gently pinched between sterile gloves, and a needle is inserted into the port.  Only "non-coring" port needles should be used to access the port. Once the port is accessed, a blood return should be checked. This helps ensure that the port  is in the vein and is not clogged.  If your port needs to remain accessed for a constant infusion, a clear (transparent) bandage will be placed over the needle site. The bandage and needle will need to be changed every week, or as directed by your health care provider.  Keep the bandage covering the needle clean and dry. Do not get it wet. Follow your health care provider's instructions on how to take a shower or bath while the port is accessed.  If your port does not need to stay accessed, no bandage is needed over the port.  What is flushing? Flushing helps keep the port from getting clogged. Follow your health care provider's instructions on how and when to flush the port. Ports are usually flushed with saline solution or a medicine called heparin. The need for flushing will depend on how the port is used.  If the port is used for intermittent medicines or blood draws, the port will need to be flushed: ? After medicines have been given. ? After blood has been drawn. ? As part of routine maintenance.  If a constant infusion is running, the port may not need to be flushed.  How long will my port stay implanted? The port can stay in for as long as your health care provider thinks it is needed. When it is time for the port to come out, surgery will be   done to remove it. The procedure is similar to the one performed when the port was put in. When should I seek immediate medical care? When you have an implanted port, you should seek immediate medical care if:  You notice a bad smell coming from the incision site.  You have swelling, redness, or drainage at the incision site.  You have more swelling or pain at the port site or the surrounding area.  You have a fever that is not controlled with medicine.  This information is not intended to replace advice given to you by your health care provider. Make sure you discuss any questions you have with your health care provider. Document  Released: 12/13/2005 Document Revised: 05/20/2016 Document Reviewed: 08/20/2013 Elsevier Interactive Patient Education  2017 [employer]  

## 2018-11-09 ENCOUNTER — Ambulatory Visit: Payer: PPO

## 2018-11-09 ENCOUNTER — Other Ambulatory Visit: Payer: PPO

## 2018-11-09 ENCOUNTER — Ambulatory Visit: Payer: PPO | Admitting: Internal Medicine

## 2018-11-09 ENCOUNTER — Telehealth: Payer: Self-pay | Admitting: Oncology

## 2018-11-09 NOTE — Assessment & Plan Note (Signed)
[name] was multiple malignancies including history of breast cancer, history of lung cancer status post resection and most recently treated with metastatic colon adenocarcinoma with liver metastasis and MSI high. The patient is currently on treatment with Keytruda 200 mg IV every 3 weeks status post 32 cycles.   She continues to tolerate this treatment well with no concerning adverse effects. I recommended for the patient to proceed with cycle #33 today as scheduled. I will see her back for follow-up visit in 3 weeks for evaluation before the next cycle of her treatment.  For her dysuria, a urinalysis was checked today.  It was positive for nitrite and leukocytes.  Urine culture was sent.  She was started on Cipro 500 mg twice a day for 1 week.  We will adjust antibiotic if needed pending culture results.  She was advised to call immediately if she has any concerning symptoms in the interval. The patient voices understanding of current disease status and treatment options and is in agreement with the current care plan. All questions were answered. The patient knows to call the clinic with any problems, questions or concerns. We can certainly see the patient much sooner if necessary.

## 2018-11-09 NOTE — Telephone Encounter (Signed)
Scheduled appt per 11/13 los- pt to get an updated schedule next visit.

## 2018-11-10 ENCOUNTER — Telehealth: Payer: Self-pay | Admitting: *Deleted

## 2018-11-10 LAB — URINE CULTURE

## 2018-11-10 NOTE — Telephone Encounter (Signed)
I don't see an INR done since 10/4. She should have INR checked at least every 4 weeks.  She needs to come in on Monday for INR here, can put in acute spot on my schedule if needed. OK to take the cipro. Hold warfarin until she gets INR on Monday.

## 2018-11-10 NOTE — Telephone Encounter (Signed)
Patient aware and appt made for Monday 11/13/2018 at 10:15am with Dr. Evette Doffing to check INR

## 2018-11-10 NOTE — Telephone Encounter (Signed)
Pt called requesting another antibiotic for UTI.  Stated she was informed by her pharmacy that she would have to stop Coumadin in order to take Cipro.  Erasmo Downer, NP notified. Spoke with pt and informed her that per Erasmo Downer, NP :  Urine culture showed pt is sensitive to Cipro.  Pt needs to take Cipro, and have lab and adjust  Coumadin dosage  closely monitored by her provider.  Pt voiced understanding. Pt's   Phone    [phone].

## 2018-11-10 NOTE — Telephone Encounter (Signed)
During chemo appt pt had test for UTI d/t symptoms She has been placed on Cipro, has been informed by pharmacy to stop Coumadin in order to take [place] instructed her to take medication and have labs done to adjust her coumadin dose Please advise when pt needs to have INR done, she said she has it done every 3 wks when she has treatments

## 2018-11-13 ENCOUNTER — Ambulatory Visit (INDEPENDENT_AMBULATORY_CARE_PROVIDER_SITE_OTHER): Payer: PPO | Admitting: Pediatrics

## 2018-11-13 ENCOUNTER — Encounter: Payer: Self-pay | Admitting: Pediatrics

## 2018-11-13 VITALS — BP 135/71 | HR 72 | Temp 96.8°F | Ht 71.0 in | Wt 282.2 lb

## 2018-11-13 DIAGNOSIS — N3000 Acute cystitis without hematuria: Secondary | ICD-10-CM

## 2018-11-13 DIAGNOSIS — Z7901 Long term (current) use of anticoagulants: Secondary | ICD-10-CM | POA: Diagnosis not present

## 2018-11-13 LAB — COAGUCHEK XS/INR WAIVED
INR: 1.5 — ABNORMAL HIGH (ref 0.9–1.1)
PROTHROMBIN TIME: 17.9 s

## 2018-11-13 NOTE — Progress Notes (Signed)
  Subjective:   Patient ID: Tammie [name], female    DOB: Feb 28, 1941, 77 y.o.   MRN: [id number] CC: chronic anticoagulation  HPI: Tammie [name] is a 77 y.o. female   Atrial fibrillation: On chronic anticoagulation with warfarin.  Warfarin was held over the weekend, last INR in the system from 10/4, was started on Cipro last week at oncology appointment for UTI.  Patient says she has been getting INRs with every blood draw with oncology every 3 weeks.  Not sure why I am not able to see INR numbers.  UTI: Has 4 more days of Cipro left.  No longer having dysuria.  Still feeling slightly queasy in her stomach.  Urine culture from 11/13, Klebsiella, sensitive to Cipro.  No fevers.  Relevant past medical, surgical, family and social history reviewed. Allergies and medications reviewed and updated. Social History   Tobacco Use  Smoking Status Former Smoker  . Packs/day: 3.00  . Years: 32.00  . Pack years: 96.00  . Types: Cigarettes  . Start date: 12/27/1989  . Last attempt to quit: 03/08/1990  . Years since quitting: 28.7  Smokeless Tobacco Former Systems developer  . Quit date: 03/08/1990  Tobacco Comment   smoked 3ppd from 1959-1991    ROS: Per HPI   Objective:    BP 135/71   Pulse 72   Temp (!) 96.8 F (36 C) (Oral)   Ht 5\' 11"  (1.803 m)   Wt 282 lb 3.2 oz (128 kg)   BMI 39.36 kg/m   Wt Readings from Last 3 Encounters:  11/13/18 282 lb 3.2 oz (128 kg)  10/19/18 284 lb 7 oz (129 kg)  09/29/18 282 lb 12.8 oz (128.3 kg)    Gen: NAD, alert, cooperative with exam, NCAT EYES: EOMI, no conjunctival injection, or no icterus CV: NRRR, normal S1/S2, no murmur, distal pulses 2+ b/l Resp: CTABL, no wheezes, normal WOB Abd: +BS, soft, NTND. no guarding or organomegaly Ext: Trace pitting edema bilateral lower shins, warm Neuro: Alert and oriented, strength equal b/l UE and LE, coordination grossly normal MSK: normal muscle bulk  Assessment & Plan:  Kevina was seen today for chronic  anticoagulation.  Diagnoses and all orders for this visit:  Chronic anticoagulation Atrial fibrillation INR 1.5 today.  Warfarin held for the last 3 days.  Take warfarin every other day, starting tonight until Cipro Rx completed.  Then start regular dosing Friday.  Follow-up with oncology as scheduled this week in December for next lab draw. -     CoaguChek XS/INR Waived  Acute cystitis without hematuria Symptoms improving, still with some nausea.  Appetite still slightly down.  And her Cipro.  Let me know if any worsening symptoms.  Follow up plan: Return in about 3 months (around 02/13/2019). [name], MD [place]

## 2018-11-21 IMAGING — CT CT ABD-PELV W/O CM
2 of 4 series · 14 of 46 positions shown, 16 images · non-contrast
Comparison: Multiple exams, including 03/01/2017 and PET-CT from
12/09/2016

CLINICAL DATA: Right lower lobe lung cancer, adenocarcinoma also
found upon liver biopsy. Renal insufficiency.

EXAM:
CT CHEST, ABDOMEN AND PELVIS WITHOUT CONTRAST
TECHNIQUE: Multidetector CT imaging of the chest, abdomen and pelvis was
performed following the standard protocol without IV contrast.

[Series 2: cap w/o · axial · non-contrast · 0.95mm/px · z∈[-700,-115]mm · 11 of 137 slices shown, 13 images]
[im 10/137  soft-tissue]
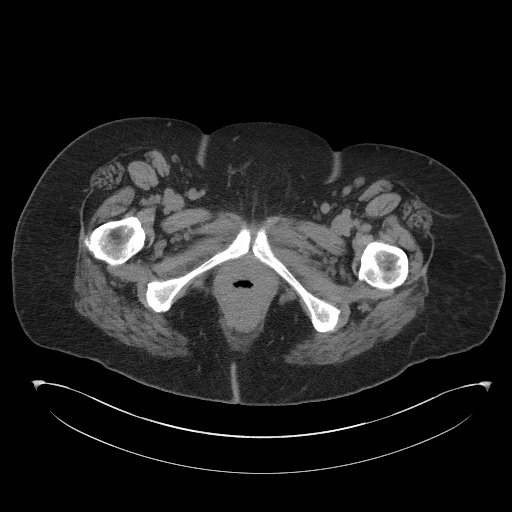
[im 10/137  bone]
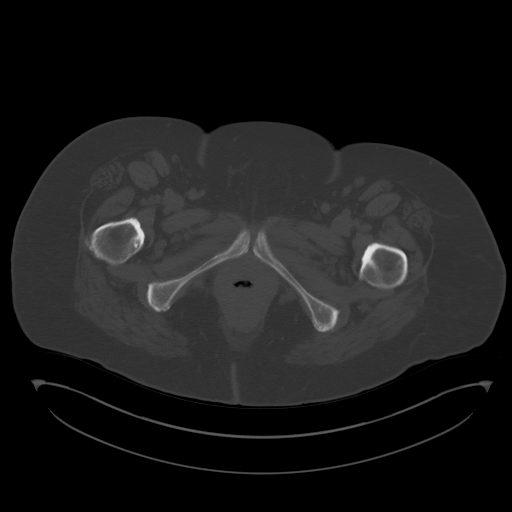
[im 19/137  soft-tissue]
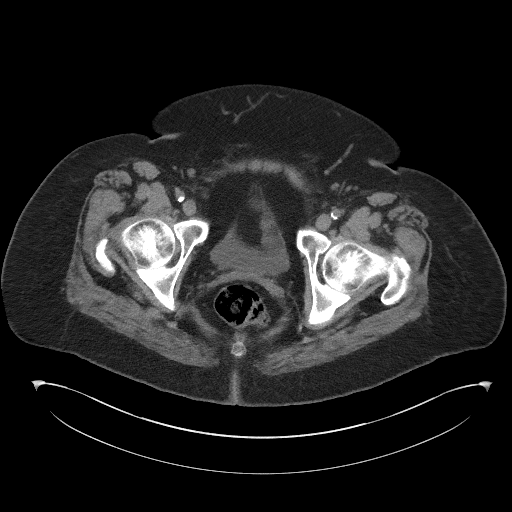
[im 37/137  soft-tissue]
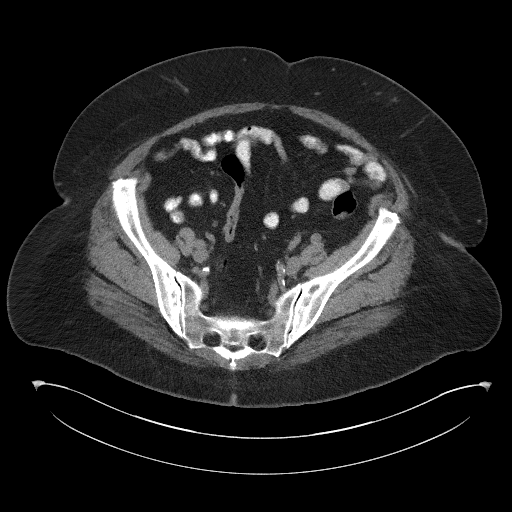
[im 46/137  soft-tissue]
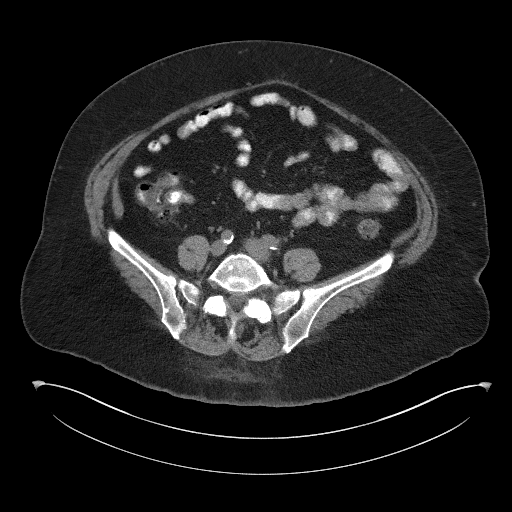
[im 55/137  soft-tissue]
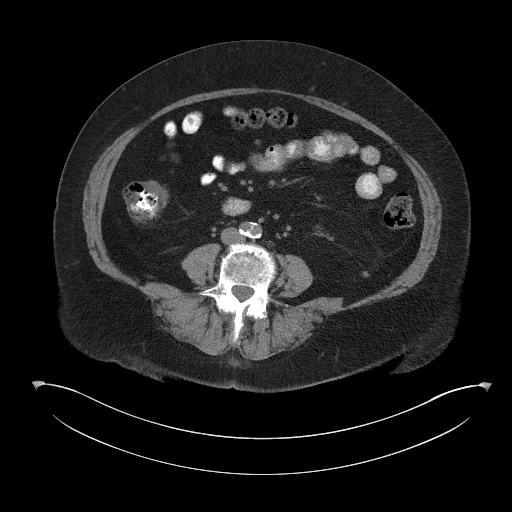
[im 73/137  soft-tissue]
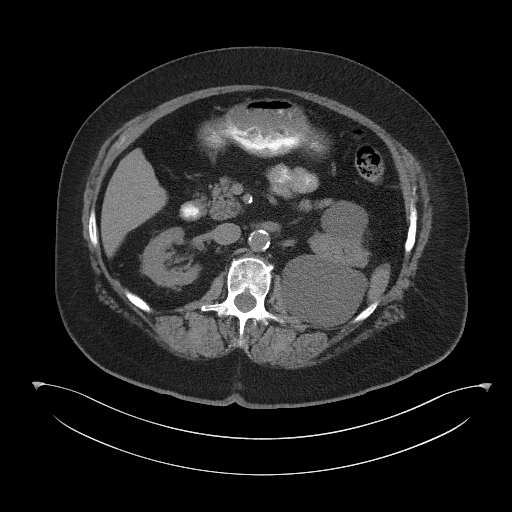
[im 82/137  soft-tissue]
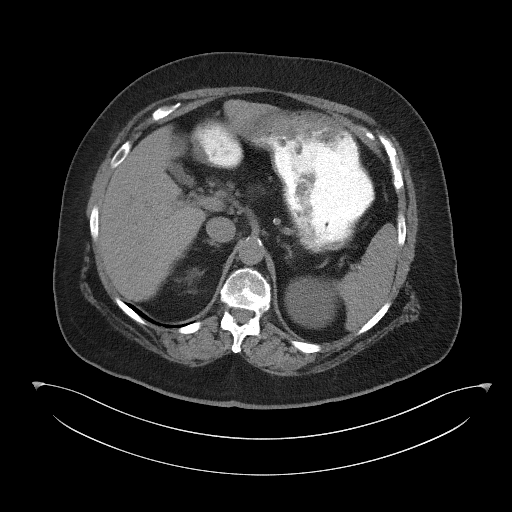
[im 91/137  soft-tissue]
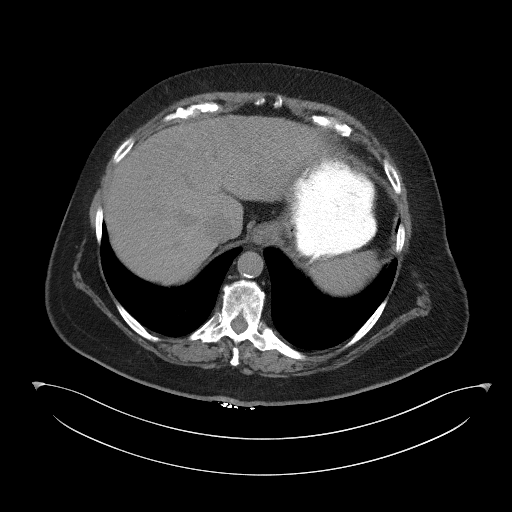
[im 100/137  soft-tissue]
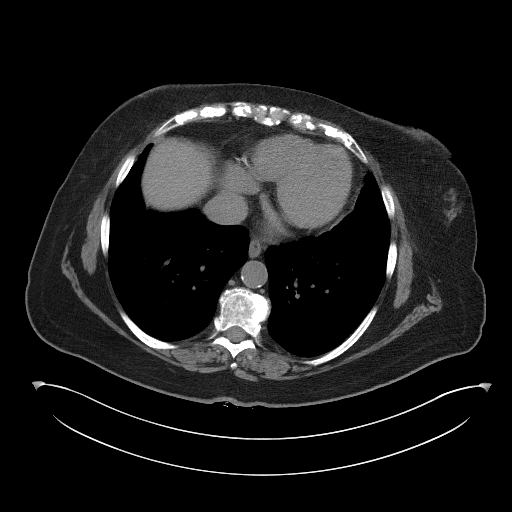
[im 100/137  bone]
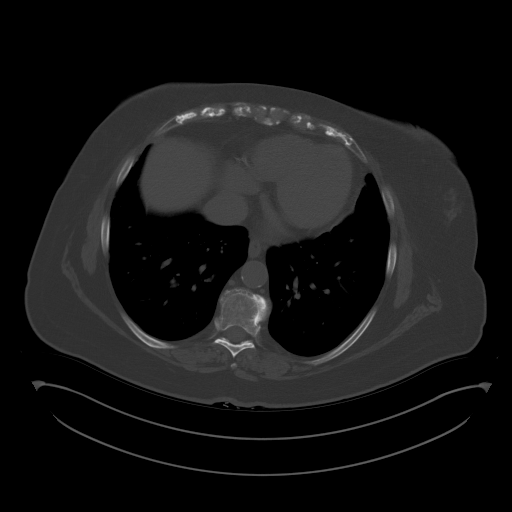
[im 118/137  soft-tissue]
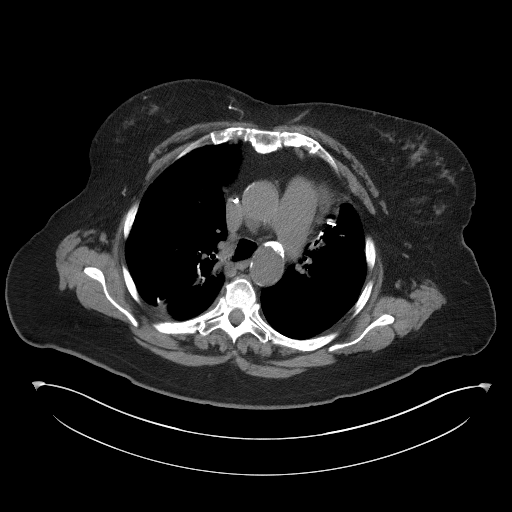
[im 127/137  soft-tissue]
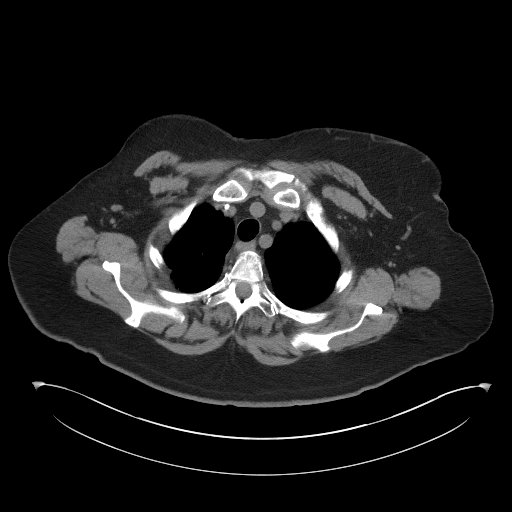

[Series 5: coronals · coronal · 0.97mm/px · 3 of 169 slices shown]
[im 57/169  soft-tissue]
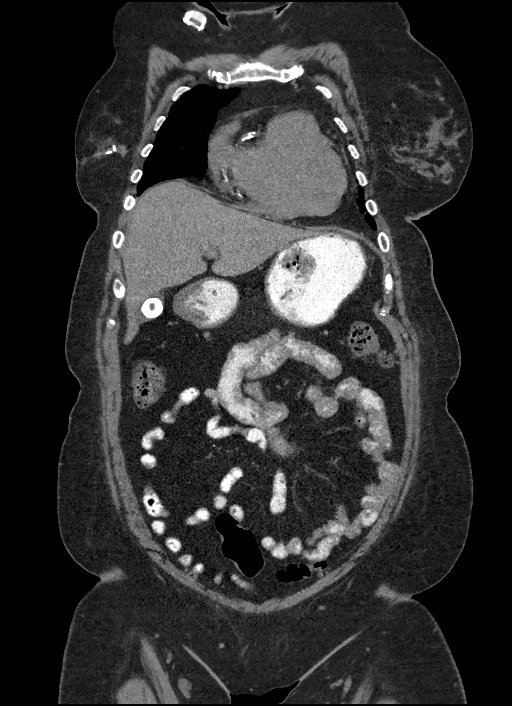
[im 75/169  soft-tissue]
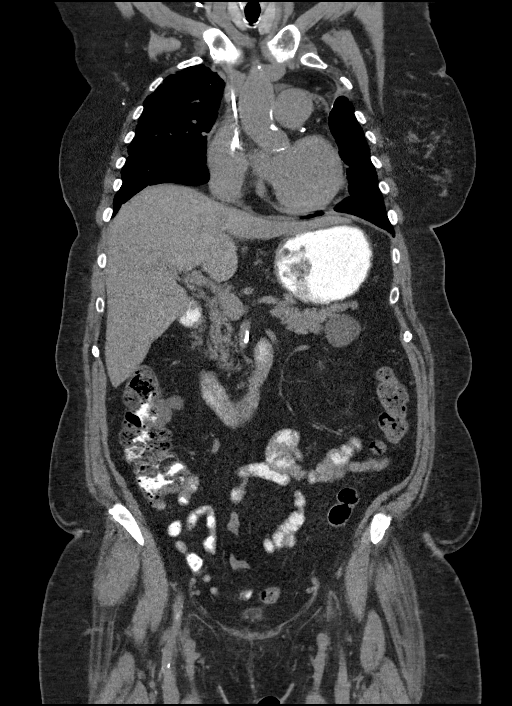
[im 94/169  soft-tissue]
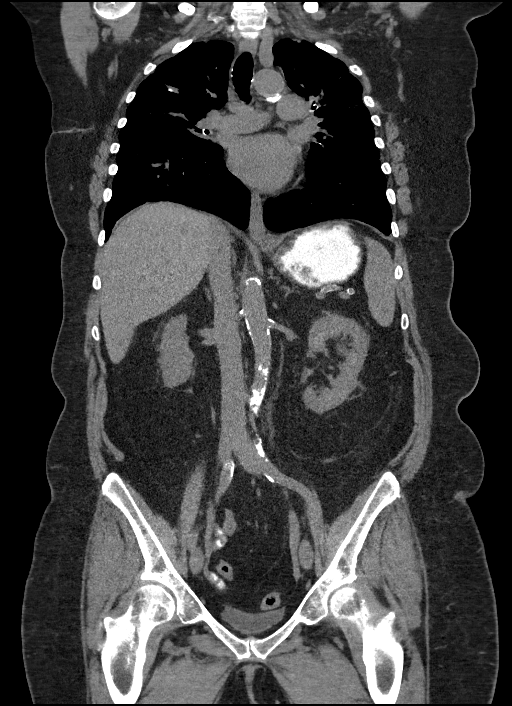

[14 of 46 positions shown; findings below may reference images not displayed]

FINDINGS: CT CHEST FINDINGS

Cardiovascular: Coronary, aortic arch, and branch vessel
atherosclerotic vascular disease. Mild cardiomegaly.

Mediastinum/Nodes: No pathologic adenopathy is observed in the
chest.

Lungs/Pleura: Biapical pleuroparenchymal scarring.

1.0 by 1.2 cm ground-glass density in the right upper lobe on image
[DATE], stable and relatively similar to exams back through
02/18/2015.

Postoperative findings in both lungs with scarring adjacent to wedge
resection sites. Scattered fine peripheral interstitial
accentuation.

0.8 by 0.5 cm posterior basal segment right lower lobe pulmonary
nodule on image 97/4. This is relatively similar to the most recent
prior exam but measured only 3 mm in diameter back on 02/18/2015.

In the superior segment left lower lobe the previously seen nodule
is less distinct, with an approximately 7 mm highly indistinct sub
solid nodule shown on image 57/4.

Musculoskeletal: Mild thoracic spondylosis.

CT ABDOMEN PELVIS FINDINGS

Hepatobiliary: 2.4 cm gallstone in the gallbladder.

Hypodense lesion corresponding to the previous segment IVb
hypermetabolic lesion, measuring 1.2 cm in width on image 54/2,
previously measured at 1.3 cm.

Previously hypermetabolic porta hepatis lymph node measures 1.2 cm
in short axis, previously the same.

No obvious new liver lesions.

Pancreas: Unremarkable

Spleen: Unremarkable

Adrenals/Urinary Tract: Similar appearance of multiple left renal
cysts. Several small bilateral nonobstructive renal calculi, the
largest 4 mm in diameter in the right kidney lower pole. No
hydronephrosis or hydroureter.

Stomach/Bowel: Unremarkable

Vascular/Lymphatic: Stable mildly enlarged porta hepatis lymph node
as described above. Aortoiliac atherosclerotic vascular disease. No
new adenopathy.

Reproductive: Uterus absent.  Adnexa unremarkable.

Other: No supplemental non-categorized findings.

Musculoskeletal: Lower lumbar spondylosis and degenerative disc
disease. This is potentially causing impingement at the L4-5 level.
IMPRESSION: 1. Stable mild enlargement of the previously hypermetabolic porta
hepatis lymph node. Essentially stable appearance of these segment
IVb hypodense lesion in the liver which was likewise previously
hypermetabolic.
2. A 0.8 by 0.5 cm posterior basal segment right lower lobe
pulmonary nodule is stable compared to the prior exam, but appears
to of slowly increased since early 1728. This lesion was not
recognizable E hypermetabolic on the recent PET-CT from 12/09/2016.
3. Chronically stable appearance of a ground-glass opacity 1.0 by
1.2 cm lesion in the right upper lobe, may merit surveillance.
4. Significant reduction in conspicuity of the previous left lower
lobe nodule.
5. Other imaging findings of potential clinical significance: Aortic
Atherosclerosis ([id number].R). Postoperative findings in both lungs.
Coronary atherosclerosis. Cholelithiasis. Bilateral nonobstructive
nephrolithiasis. Left renal cysts. Possible impingement at L4-5 due
to spondylosis and degenerative disc disease.

## 2018-11-30 ENCOUNTER — Inpatient Hospital Stay: Payer: PPO

## 2018-11-30 ENCOUNTER — Inpatient Hospital Stay: Payer: PPO | Attending: Internal Medicine | Admitting: Internal Medicine

## 2018-11-30 ENCOUNTER — Encounter: Payer: Self-pay | Admitting: Internal Medicine

## 2018-11-30 VITALS — BP 150/66 | HR 87 | Temp 98.5°F | Resp 18 | Ht 71.0 in | Wt 281.5 lb

## 2018-11-30 DIAGNOSIS — J449 Chronic obstructive pulmonary disease, unspecified: Secondary | ICD-10-CM | POA: Insufficient documentation

## 2018-11-30 DIAGNOSIS — Z5112 Encounter for antineoplastic immunotherapy: Secondary | ICD-10-CM | POA: Insufficient documentation

## 2018-11-30 DIAGNOSIS — C3431 Malignant neoplasm of lower lobe, right bronchus or lung: Secondary | ICD-10-CM

## 2018-11-30 DIAGNOSIS — Z853 Personal history of malignant neoplasm of breast: Secondary | ICD-10-CM | POA: Insufficient documentation

## 2018-11-30 DIAGNOSIS — Z79899 Other long term (current) drug therapy: Secondary | ICD-10-CM | POA: Diagnosis not present

## 2018-11-30 DIAGNOSIS — C182 Malignant neoplasm of ascending colon: Secondary | ICD-10-CM | POA: Diagnosis not present

## 2018-11-30 DIAGNOSIS — C3490 Malignant neoplasm of unspecified part of unspecified bronchus or lung: Secondary | ICD-10-CM

## 2018-11-30 DIAGNOSIS — C229 Malignant neoplasm of liver, not specified as primary or secondary: Secondary | ICD-10-CM

## 2018-11-30 DIAGNOSIS — Z9981 Dependence on supplemental oxygen: Secondary | ICD-10-CM

## 2018-11-30 DIAGNOSIS — C189 Malignant neoplasm of colon, unspecified: Secondary | ICD-10-CM

## 2018-11-30 DIAGNOSIS — C787 Secondary malignant neoplasm of liver and intrahepatic bile duct: Secondary | ICD-10-CM | POA: Insufficient documentation

## 2018-11-30 DIAGNOSIS — I10 Essential (primary) hypertension: Secondary | ICD-10-CM | POA: Diagnosis not present

## 2018-11-30 DIAGNOSIS — Z95828 Presence of other vascular implants and grafts: Secondary | ICD-10-CM

## 2018-11-30 LAB — CBC WITH DIFFERENTIAL (CANCER CENTER ONLY)
Abs Immature Granulocytes: 0.02 10*3/uL (ref 0.00–0.07)
BASOS PCT: 1 %
Basophils Absolute: 0.1 10*3/uL (ref 0.0–0.1)
Eosinophils Absolute: 0.3 10*3/uL (ref 0.0–0.5)
Eosinophils Relative: 5 %
HCT: 45.8 % (ref 36.0–46.0)
Hemoglobin: 14.4 g/dL (ref 12.0–15.0)
Immature Granulocytes: 0 %
Lymphocytes Relative: 18 %
Lymphs Abs: 1 10*3/uL (ref 0.7–4.0)
MCH: 28 pg (ref 26.0–34.0)
MCHC: 31.4 g/dL (ref 30.0–36.0)
MCV: 89.1 fL (ref 80.0–100.0)
MONOS PCT: 9 %
Monocytes Absolute: 0.5 10*3/uL (ref 0.1–1.0)
Neutro Abs: 3.9 10*3/uL (ref 1.7–7.7)
Neutrophils Relative %: 67 %
Platelet Count: 177 10*3/uL (ref 150–400)
RBC: 5.14 MIL/uL — AB (ref 3.87–5.11)
RDW: 14.1 % (ref 11.5–15.5)
WBC Count: 5.8 10*3/uL (ref 4.0–10.5)
nRBC: 0 % (ref 0.0–0.2)

## 2018-11-30 LAB — CMP (CANCER CENTER ONLY)
ALT: 8 U/L (ref 0–44)
AST: 12 U/L — AB (ref 15–41)
Albumin: 3.4 g/dL — ABNORMAL LOW (ref 3.5–5.0)
Alkaline Phosphatase: 60 U/L (ref 38–126)
Anion gap: 9 (ref 5–15)
BUN: 25 mg/dL — ABNORMAL HIGH (ref 8–23)
CO2: 27 mmol/L (ref 22–32)
Calcium: 9.3 mg/dL (ref 8.9–10.3)
Chloride: 108 mmol/L (ref 98–111)
Creatinine: 1.59 mg/dL — ABNORMAL HIGH (ref 0.44–1.00)
GFR, Est AFR Am: 36 mL/min — ABNORMAL LOW (ref >60)
GFR, Estimated: 31 mL/min — ABNORMAL LOW (ref >60)
Glucose, Bld: 131 mg/dL — ABNORMAL HIGH (ref 70–99)
POTASSIUM: 4.8 mmol/L (ref 3.5–5.1)
Sodium: 144 mmol/L (ref 135–145)
Total Bilirubin: 0.4 mg/dL (ref 0.3–1.2)
Total Protein: 7 g/dL (ref 6.5–8.1)

## 2018-11-30 LAB — TSH: TSH: 4.467 u[IU]/mL — ABNORMAL HIGH (ref 0.308–3.960)

## 2018-11-30 MED ORDER — SODIUM CHLORIDE 0.9% FLUSH
10.0000 mL | INTRAVENOUS | Status: DC | PRN
  Administered 2018-11-30: 10 mL
  Filled 2018-11-30: qty 10

## 2018-11-30 MED ORDER — HEPARIN SOD (PORK) LOCK FLUSH 100 UNIT/ML IV SOLN
500.0000 [IU] | Freq: Once | INTRAVENOUS | Status: AC | PRN
  Administered 2018-11-30: 500 [IU]
  Filled 2018-11-30: qty 5

## 2018-11-30 MED ORDER — SODIUM CHLORIDE 0.9 % IV SOLN
Freq: Once | INTRAVENOUS | Status: AC
  Administered 2018-11-30: 13:00:00 via INTRAVENOUS
  Filled 2018-11-30: qty 250

## 2018-11-30 MED ORDER — SODIUM CHLORIDE 0.9 % IV SOLN
200.0000 mg | Freq: Once | INTRAVENOUS | Status: AC
  Administered 2018-11-30: 200 mg via INTRAVENOUS
  Filled 2018-11-30: qty 8
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
## 2018-11-30 NOTE — Patient Instructions (Signed)
[facility] Discharge Instructions for Patients Receiving Chemotherapy  Today you received the following chemotherapy agents: Pembrolizumab Beryle Flock)  To help prevent nausea and vomiting after your treatment, we encourage you to take your nausea medication as directed.    If you develop nausea and vomiting that is not controlled by your nausea medication, call the clinic.   BELOW ARE SYMPTOMS THAT SHOULD BE REPORTED IMMEDIATELY:  *FEVER GREATER THAN 100.5 F  *CHILLS WITH OR WITHOUT FEVER  NAUSEA AND VOMITING THAT IS NOT CONTROLLED WITH YOUR NAUSEA MEDICATION  *UNUSUAL SHORTNESS OF BREATH  *UNUSUAL BRUISING OR BLEEDING  TENDERNESS IN MOUTH AND THROAT WITH OR WITHOUT PRESENCE OF ULCERS  *URINARY PROBLEMS  *BOWEL PROBLEMS  UNUSUAL RASH Items with * indicate a potential emergency and should be followed up as soon as possible.  Feel free to call the clinic should you have any questions or concerns. The clinic phone number is [phone].  Please show the [facility] at check-in to the Emergency Department and triage nurse.

## 2018-12-01 ENCOUNTER — Other Ambulatory Visit: Payer: Self-pay | Admitting: *Deleted

## 2018-12-01 DIAGNOSIS — I482 Chronic atrial fibrillation, unspecified: Secondary | ICD-10-CM

## 2018-12-01 DIAGNOSIS — Z7901 Long term (current) use of anticoagulants: Secondary | ICD-10-CM

## 2018-12-01 DIAGNOSIS — I4891 Unspecified atrial fibrillation: Secondary | ICD-10-CM

## 2018-12-13 IMAGING — CR DG CHEST 2V
2 series · 2 of 2 positions shown · non-contrast
Comparison: CT chest 05/05/2017.  PA and lateral chest 08/10/2016.

CLINICAL DATA: Chest pain since last night. History of a left upper
lobectomy and lung carcinoma.

EXAM:
CHEST  2 VIEW

[w chest pa]
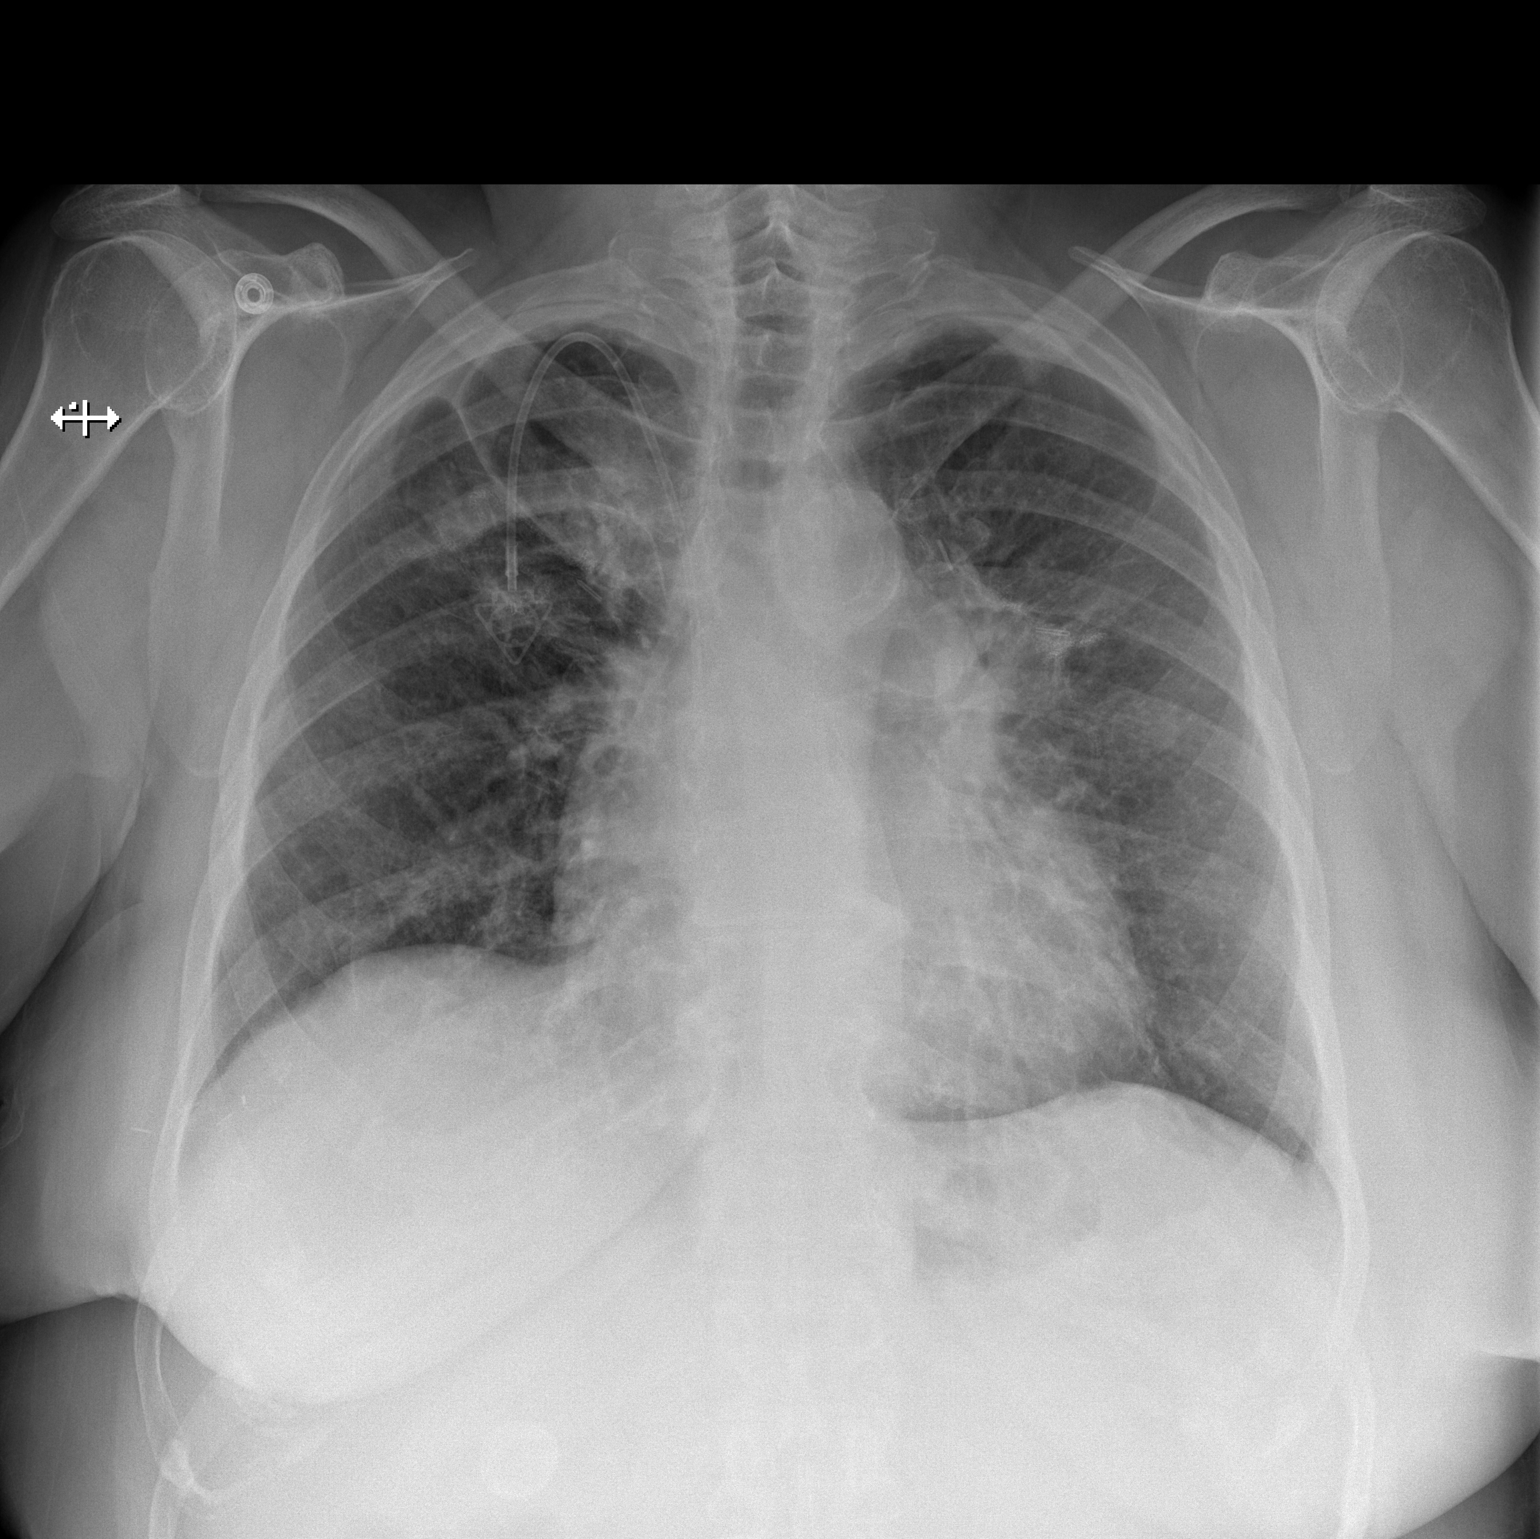

[w chest lat]
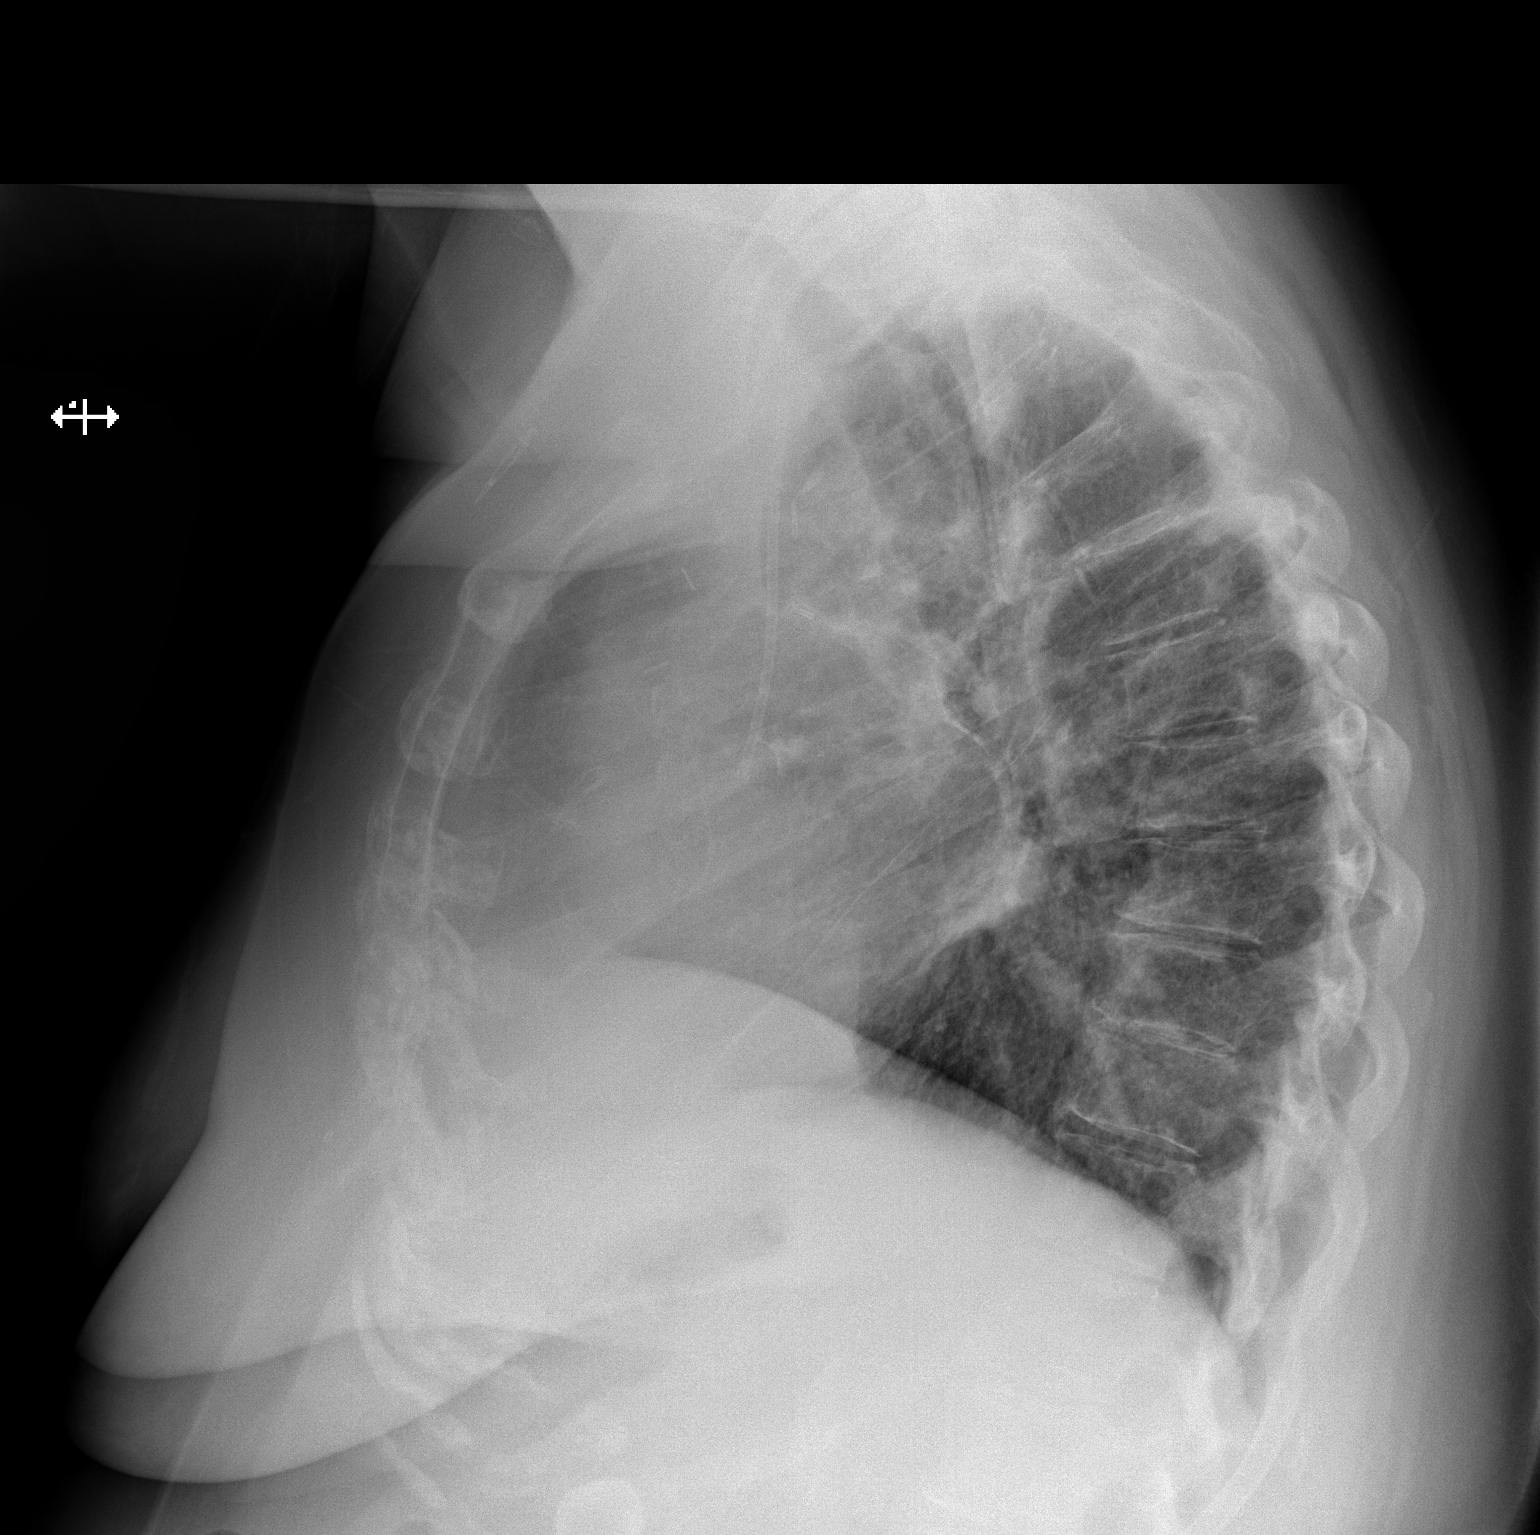

[2 of 2 positions shown; findings below may reference images not displayed]

FINDINGS: Port-A-Cath is again seen. No consolidative process, pneumothorax or
effusion. Postoperative change of left upper lobectomy is seen.
There is cardiomegaly. Atherosclerosis noted. No acute bony
abnormality.
IMPRESSION: No acute disease.

## 2018-12-13 IMAGING — CT CT CHEST W/O CM
2 of 4 series · 15 of 36 positions shown, 18 images · non-contrast
Comparison: 05/05/2017 chest CT

CLINICAL DATA: Fever and chills beginning last evening. Patient had
chemotherapy yesterday with increasing dyspnea and chest tightness.
History of colon, lung and breast cancer with metastasis.

EXAM:
CT CHEST WITHOUT CONTRAST
TECHNIQUE: Multidetector CT imaging of the chest was performed following the
standard protocol without IV contrast.

[Series 2: chest w/o st · axial · non-contrast · 0.72mm/px · z∈[-357,-93]mm · 12 of 156 slices shown, 15 images]
[im 12/156  mediastinal]
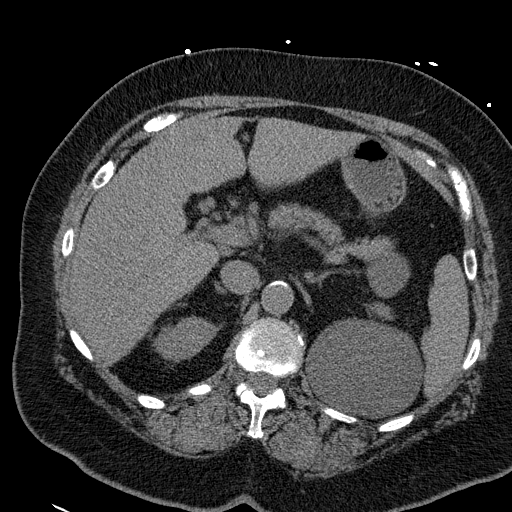
[im 12/156  lung]
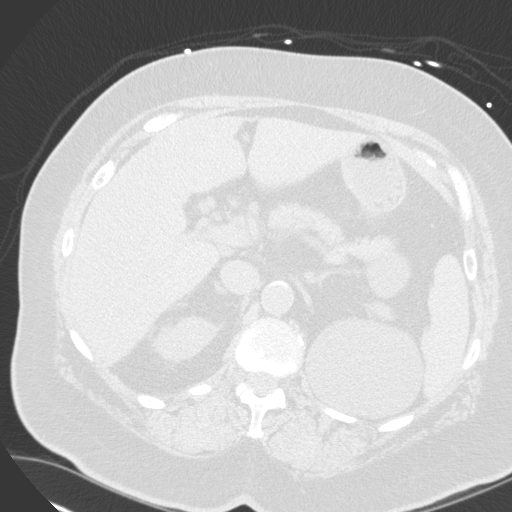
[im 24/156  lung]
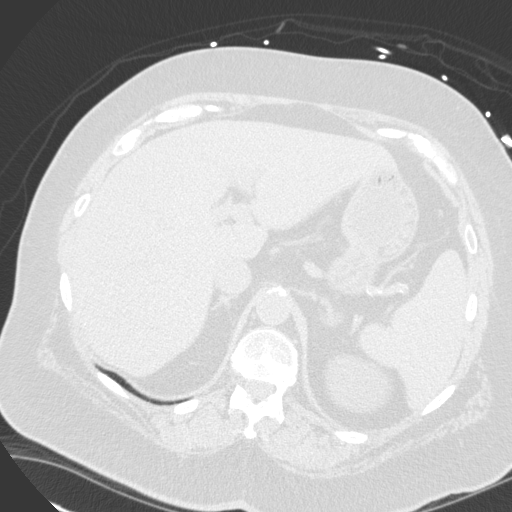
[im 36/156  lung]
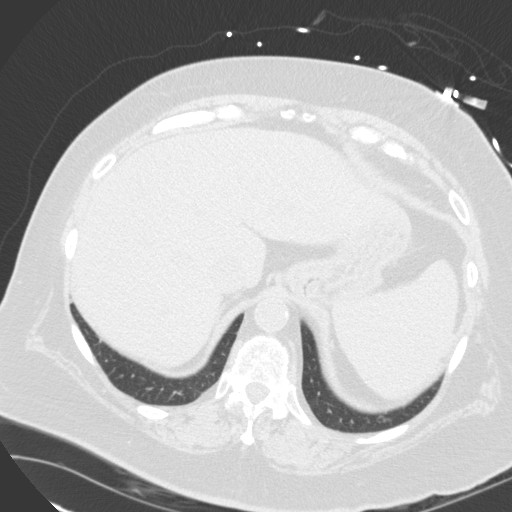
[im 48/156  lung]
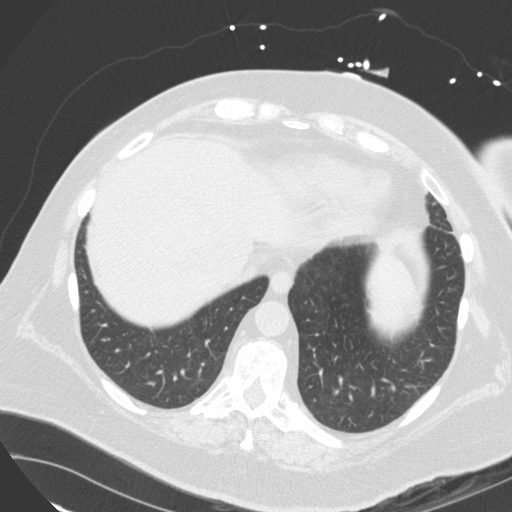
[im 60/156  mediastinal]
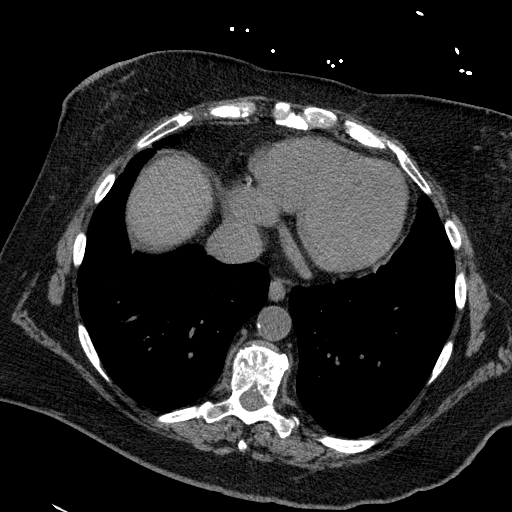
[im 60/156  lung]
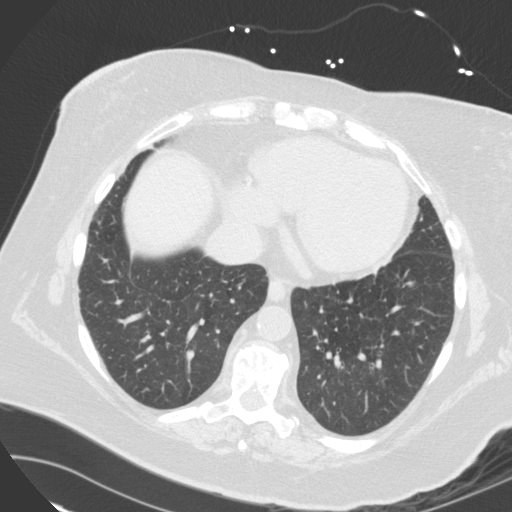
[im 72/156  lung]
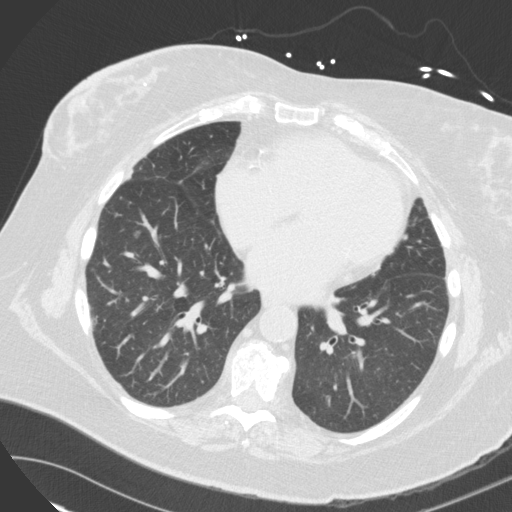
[im 84/156  lung]
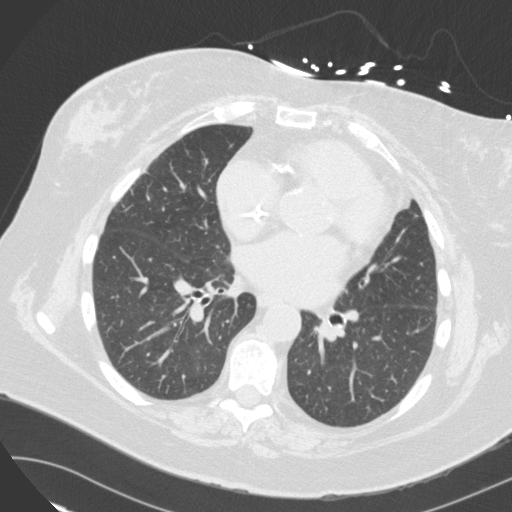
[im 96/156  lung]
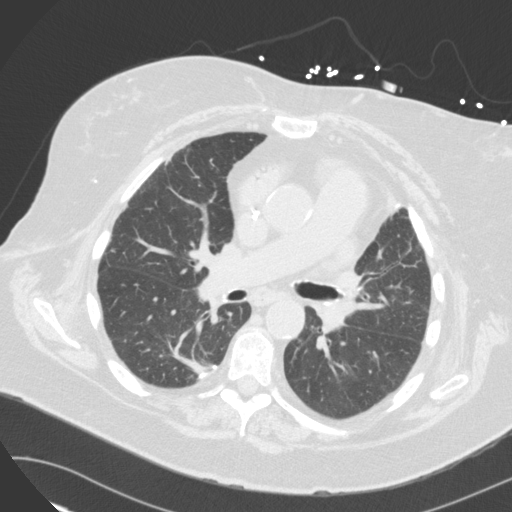
[im 108/156  mediastinal]
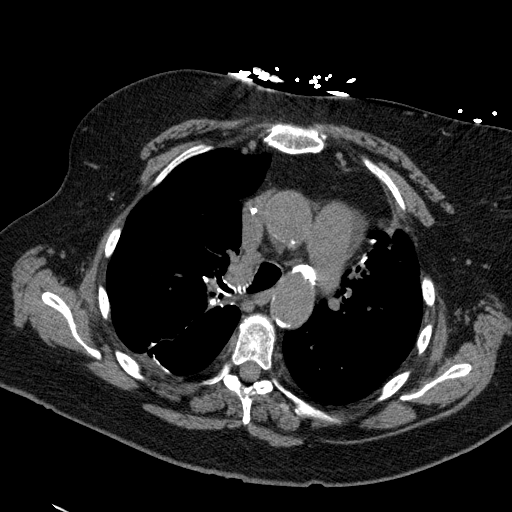
[im 108/156  lung]
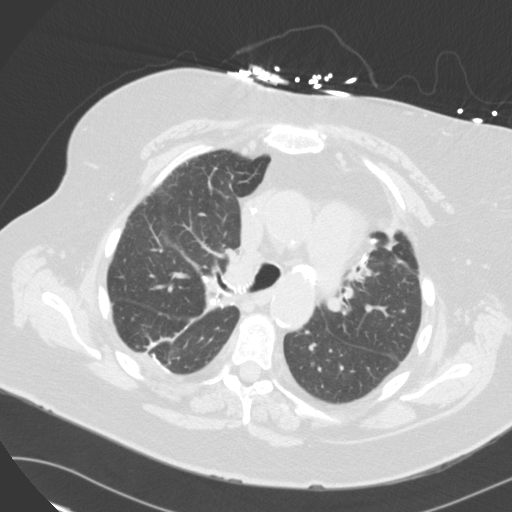
[im 120/156  lung]
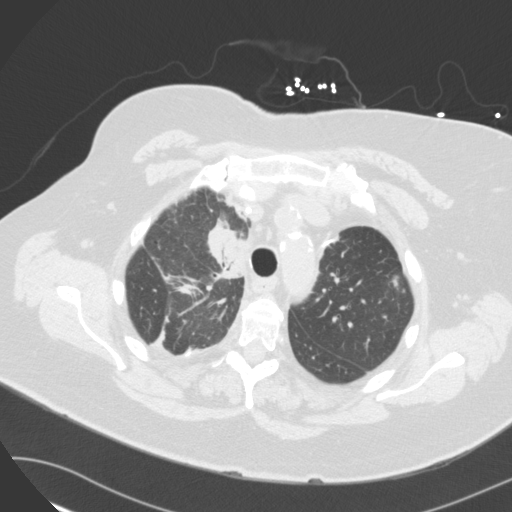
[im 132/156  lung]
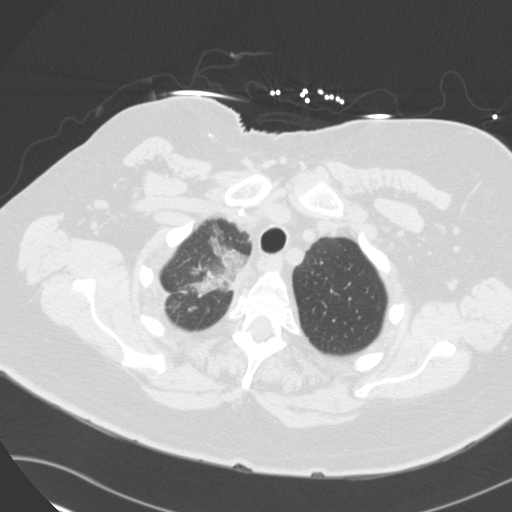
[im 144/156  lung]
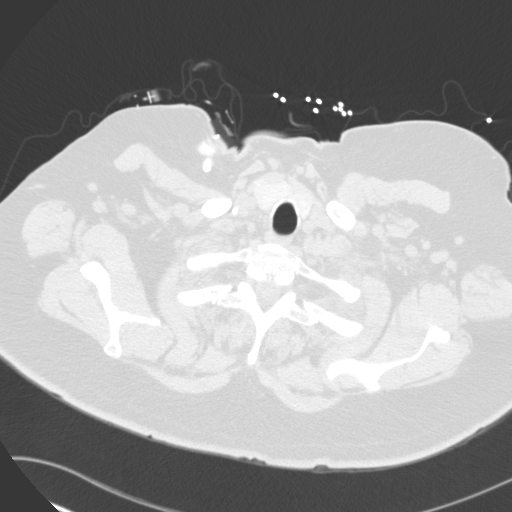

[Series 6: coronals · coronal · 0.63mm/px · 3 of 155 slices shown]
[im 31/155  lung]
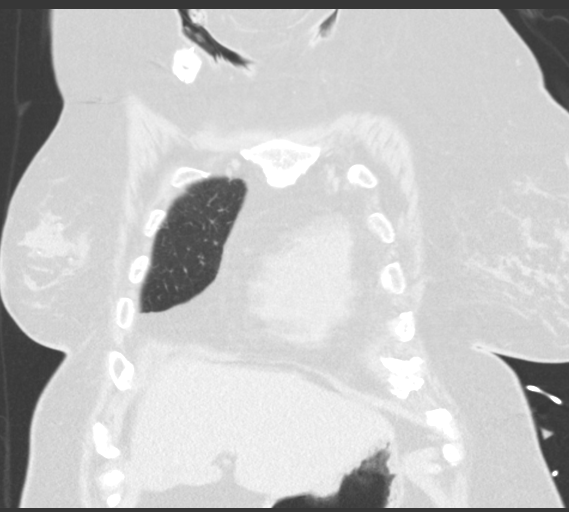
[im 62/155  lung]
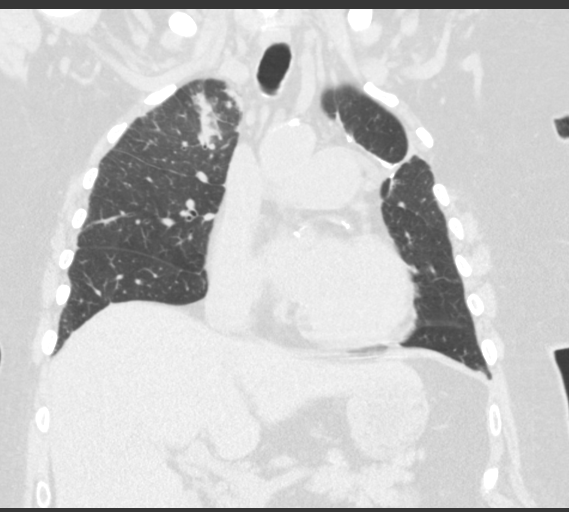
[im 93/155  lung]
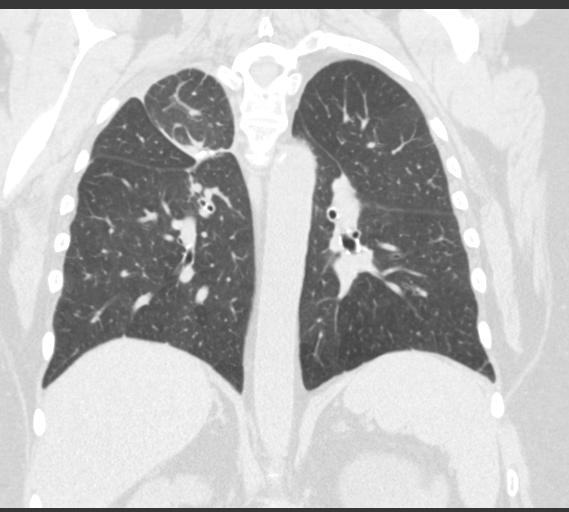

[15 of 36 positions shown; findings below may reference images not displayed]

FINDINGS: Cardiovascular: Stable mild cardiomegaly without pericardial
effusion. Aortic atherosclerosis and coronary arteriosclerosis is
again noted.

Mediastinum/Nodes: No enlarged mediastinal or axillary lymph nodes.
Thyroid gland, trachea, and esophagus demonstrate no significant
findings.

Lungs/Pleura: New confluent airspace opacity with air bronchograms
in the right upper lobe since recent comparison exam consistent with
pneumonia. Streaky atelectasis and/or scarring is seen bilaterally
within both lungs and right lung base status post wedge resections.
Stable tiny subpleural nodular densities are seen in the right upper
lobe measuring up to 6 mm, series 3, image 21, series 3, image 29.

A 7 x 5 mm posterior right lower lobe pulmonary nodule is stable,
series 3, image 93. No effusion or pneumothorax.

Upper Abdomen: Partially visualized gallbladder with 19 mm
gallstone. Left-sided partially imaged renal cysts that appear
simple. No adrenal mass. No lymphadenopathy on images provided.
Subtle hypodensity in the left hepatic lobe series 2, image 147
measuring approximately 18 mm is unchanged given lack of IV contrast
for better assessment. Stable 13 mm short axis porta hepatis lymph
node.

Musculoskeletal: Mild thoracic spondylosis. No acute nor suspicious
osseous lesions.
IMPRESSION: 1. New right upper lobe pneumonic consolidation with air
bronchograms.
2. Stable postoperative scarring of the lungs with stable
nodularity.
3. Stable 18 mm subtle hypodensity in the left hepatic lobe and 13
mm short axis porta hepatis lymph node.
4. Uncomplicated cholelithiasis.  Left-sided renal cysts.

## 2018-12-19 ENCOUNTER — Ambulatory Visit (HOSPITAL_COMMUNITY)
Admission: RE | Admit: 2018-12-19 | Discharge: 2018-12-19 | Disposition: A | Discharge status: Home / Self Care | Payer: PPO | Source: Ambulatory Visit | Attending: Internal Medicine | Admitting: Internal Medicine

## 2018-12-19 DIAGNOSIS — C3490 Malignant neoplasm of unspecified part of unspecified bronchus or lung: Secondary | ICD-10-CM | POA: Diagnosis not present

## 2018-12-19 DIAGNOSIS — C189 Malignant neoplasm of colon, unspecified: Secondary | ICD-10-CM

## 2018-12-19 DIAGNOSIS — C787 Secondary malignant neoplasm of liver and intrahepatic bile duct: Secondary | ICD-10-CM | POA: Diagnosis not present

## 2018-12-19 DIAGNOSIS — C3491 Malignant neoplasm of unspecified part of right bronchus or lung: Secondary | ICD-10-CM | POA: Diagnosis not present

## 2018-12-21 ENCOUNTER — Inpatient Hospital Stay (HOSPITAL_BASED_OUTPATIENT_CLINIC_OR_DEPARTMENT_OTHER): Payer: PPO | Admitting: Internal Medicine

## 2018-12-21 ENCOUNTER — Other Ambulatory Visit: Payer: Self-pay | Admitting: Pediatrics

## 2018-12-21 ENCOUNTER — Inpatient Hospital Stay: Payer: PPO

## 2018-12-21 ENCOUNTER — Telehealth: Payer: Self-pay

## 2018-12-21 ENCOUNTER — Encounter: Payer: Self-pay | Admitting: Internal Medicine

## 2018-12-21 VITALS — BP 135/69 | HR 63 | Temp 98.3°F | Resp 18 | Ht 71.0 in | Wt 284.1 lb

## 2018-12-21 DIAGNOSIS — I10 Essential (primary) hypertension: Secondary | ICD-10-CM

## 2018-12-21 DIAGNOSIS — J449 Chronic obstructive pulmonary disease, unspecified: Secondary | ICD-10-CM

## 2018-12-21 DIAGNOSIS — C229 Malignant neoplasm of liver, not specified as primary or secondary: Secondary | ICD-10-CM

## 2018-12-21 DIAGNOSIS — Z5112 Encounter for antineoplastic immunotherapy: Secondary | ICD-10-CM

## 2018-12-21 DIAGNOSIS — C3431 Malignant neoplasm of lower lobe, right bronchus or lung: Secondary | ICD-10-CM

## 2018-12-21 DIAGNOSIS — C189 Malignant neoplasm of colon, unspecified: Secondary | ICD-10-CM

## 2018-12-21 DIAGNOSIS — C787 Secondary malignant neoplasm of liver and intrahepatic bile duct: Secondary | ICD-10-CM

## 2018-12-21 DIAGNOSIS — M7989 Other specified soft tissue disorders: Secondary | ICD-10-CM

## 2018-12-21 DIAGNOSIS — I482 Chronic atrial fibrillation, unspecified: Secondary | ICD-10-CM

## 2018-12-21 DIAGNOSIS — Z95828 Presence of other vascular implants and grafts: Secondary | ICD-10-CM

## 2018-12-21 DIAGNOSIS — C182 Malignant neoplasm of ascending colon: Secondary | ICD-10-CM

## 2018-12-21 DIAGNOSIS — R5383 Other fatigue: Secondary | ICD-10-CM

## 2018-12-21 DIAGNOSIS — N289 Disorder of kidney and ureter, unspecified: Secondary | ICD-10-CM | POA: Diagnosis not present

## 2018-12-21 DIAGNOSIS — I4891 Unspecified atrial fibrillation: Secondary | ICD-10-CM

## 2018-12-21 DIAGNOSIS — C3490 Malignant neoplasm of unspecified part of unspecified bronchus or lung: Secondary | ICD-10-CM

## 2018-12-21 LAB — CBC WITH DIFFERENTIAL (CANCER CENTER ONLY)
Abs Immature Granulocytes: 0.01 10*3/uL (ref 0.00–0.07)
Basophils Absolute: 0.1 10*3/uL (ref 0.0–0.1)
Basophils Relative: 1 %
Eosinophils Absolute: 0.2 10*3/uL (ref 0.0–0.5)
Eosinophils Relative: 4 %
HCT: 44 % (ref 36.0–46.0)
Hemoglobin: 13.8 g/dL (ref 12.0–15.0)
Immature Granulocytes: 0 %
LYMPHS PCT: 17 %
Lymphs Abs: 0.9 10*3/uL (ref 0.7–4.0)
MCH: 28.3 pg (ref 26.0–34.0)
MCHC: 31.4 g/dL (ref 30.0–36.0)
MCV: 90.2 fL (ref 80.0–100.0)
Monocytes Absolute: 0.5 10*3/uL (ref 0.1–1.0)
Monocytes Relative: 9 %
Neutro Abs: 3.9 10*3/uL (ref 1.7–7.7)
Neutrophils Relative %: 69 %
Platelet Count: 147 10*3/uL — ABNORMAL LOW (ref 150–400)
RBC: 4.88 MIL/uL (ref 3.87–5.11)
RDW: 14.4 % (ref 11.5–15.5)
WBC Count: 5.6 10*3/uL (ref 4.0–10.5)
nRBC: 0 % (ref 0.0–0.2)

## 2018-12-21 LAB — CMP (CANCER CENTER ONLY)
ALT: 12 U/L (ref 0–44)
ANION GAP: 6 (ref 5–15)
AST: 13 U/L — ABNORMAL LOW (ref 15–41)
Albumin: 3.6 g/dL (ref 3.5–5.0)
Alkaline Phosphatase: 51 U/L (ref 38–126)
BUN: 23 mg/dL (ref 8–23)
CO2: 27 mmol/L (ref 22–32)
Calcium: 8.7 mg/dL — ABNORMAL LOW (ref 8.9–10.3)
Chloride: 110 mmol/L (ref 98–111)
Creatinine: 1.49 mg/dL — ABNORMAL HIGH (ref 0.44–1.00)
GFR, Est AFR Am: 39 mL/min — ABNORMAL LOW (ref >60)
GFR, Estimated: 34 mL/min — ABNORMAL LOW (ref >60)
GLUCOSE: 135 mg/dL — AB (ref 70–99)
Potassium: 4.6 mmol/L (ref 3.5–5.1)
Sodium: 143 mmol/L (ref 135–145)
Total Bilirubin: 0.5 mg/dL (ref 0.3–1.2)
Total Protein: 6.7 g/dL (ref 6.5–8.1)

## 2018-12-21 LAB — TSH: TSH: 3.194 u[IU]/mL (ref 0.308–3.960)

## 2018-12-21 MED ORDER — SODIUM CHLORIDE 0.9 % IV SOLN
Freq: Once | INTRAVENOUS | Status: AC
  Administered 2018-12-21: 13:00:00 via INTRAVENOUS
  Filled 2018-12-21: qty 250

## 2018-12-21 MED ORDER — SODIUM CHLORIDE 0.9% FLUSH
10.0000 mL | INTRAVENOUS | Status: DC | PRN
  Filled 2018-12-21: qty 10

## 2018-12-21 MED ORDER — HEPARIN SOD (PORK) LOCK FLUSH 100 UNIT/ML IV SOLN
500.0000 [IU] | Freq: Once | INTRAVENOUS | Status: DC | PRN
  Filled 2018-12-21: qty 5

## 2018-12-21 MED ORDER — SODIUM CHLORIDE 0.9 % IV SOLN
200.0000 mg | Freq: Once | INTRAVENOUS | Status: AC
  Administered 2018-12-21: 200 mg via INTRAVENOUS
  Filled 2018-12-21: qty 8

## 2018-12-21 MED ORDER — SODIUM CHLORIDE 0.9% FLUSH
10.0000 mL | INTRAVENOUS | Status: DC | PRN
  Administered 2018-12-21: 10 mL
  Filled 2018-12-21: qty 10

## 2018-12-21 NOTE — Telephone Encounter (Signed)
OV 02/14/19

## 2018-12-21 NOTE — Patient Instructions (Signed)
[facility] Discharge Instructions for Patients Receiving Chemotherapy  Today you received the following chemotherapy agents: Pembrolizumab Beryle Flock)  To help prevent nausea and vomiting after your treatment, we encourage you to take your nausea medication as directed.    If you develop nausea and vomiting that is not controlled by your nausea medication, call the clinic.   BELOW ARE SYMPTOMS THAT SHOULD BE REPORTED IMMEDIATELY:  *FEVER GREATER THAN 100.5 F  *CHILLS WITH OR WITHOUT FEVER  NAUSEA AND VOMITING THAT IS NOT CONTROLLED WITH YOUR NAUSEA MEDICATION  *UNUSUAL SHORTNESS OF BREATH  *UNUSUAL BRUISING OR BLEEDING  TENDERNESS IN MOUTH AND THROAT WITH OR WITHOUT PRESENCE OF ULCERS  *URINARY PROBLEMS  *BOWEL PROBLEMS  UNUSUAL RASH Items with * indicate a potential emergency and should be followed up as soon as possible.  Feel free to call the clinic should you have any questions or concerns. The clinic phone number is [phone].  Please show the [facility] at check-in to the Emergency Department and triage nurse.
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
## 2018-12-21 NOTE — Telephone Encounter (Signed)
Printed avs and calender of upcoming appointment. Per 12/26 los

## 2019-01-11 ENCOUNTER — Ambulatory Visit: Payer: PPO

## 2019-01-11 ENCOUNTER — Other Ambulatory Visit: Payer: PPO

## 2019-01-11 ENCOUNTER — Ambulatory Visit: Payer: PPO | Admitting: Internal Medicine

## 2019-01-22 ENCOUNTER — Ambulatory Visit (INDEPENDENT_AMBULATORY_CARE_PROVIDER_SITE_OTHER): Payer: PPO | Admitting: Family

## 2019-01-22 ENCOUNTER — Encounter: Payer: Self-pay | Admitting: Family

## 2019-01-22 VITALS — BP 139/72 | HR 69 | Temp 97.1°F | Ht 71.0 in | Wt 282.0 lb

## 2019-01-22 DIAGNOSIS — I4891 Unspecified atrial fibrillation: Secondary | ICD-10-CM | POA: Diagnosis not present

## 2019-01-22 DIAGNOSIS — E785 Hyperlipidemia, unspecified: Secondary | ICD-10-CM | POA: Diagnosis not present

## 2019-01-22 DIAGNOSIS — J449 Chronic obstructive pulmonary disease, unspecified: Secondary | ICD-10-CM | POA: Diagnosis not present

## 2019-01-22 DIAGNOSIS — J45909 Unspecified asthma, uncomplicated: Secondary | ICD-10-CM

## 2019-01-22 DIAGNOSIS — J4489 Other specified chronic obstructive pulmonary disease: Secondary | ICD-10-CM

## 2019-01-22 DIAGNOSIS — C189 Malignant neoplasm of colon, unspecified: Secondary | ICD-10-CM | POA: Diagnosis not present

## 2019-01-22 DIAGNOSIS — C3431 Malignant neoplasm of lower lobe, right bronchus or lung: Secondary | ICD-10-CM

## 2019-01-22 DIAGNOSIS — I10 Essential (primary) hypertension: Secondary | ICD-10-CM

## 2019-01-22 DIAGNOSIS — C50411 Malignant neoplasm of upper-outer quadrant of right female breast: Secondary | ICD-10-CM

## 2019-01-22 LAB — COAGUCHEK XS/INR WAIVED
INR: 2.2 — ABNORMAL HIGH (ref 0.9–1.1)
Prothrombin Time: 26.7 s

## 2019-01-22 IMAGING — CT CT CHEST W/O CM
2 of 4 series · 13 of 36 positions shown, 16 images · non-contrast
Comparison: Chest CT 05/27/2017.  Abdomen and pelvis CT 05/05/2017.

CLINICAL DATA: History of non-small-cell lung cancer. Also with
breast cancer. Metastatic liver disease of unknown primary but
possible GI or pancreatic source.

EXAM:
CT CHEST, ABDOMEN AND PELVIS WITHOUT CONTRAST
TECHNIQUE: Multidetector CT imaging of the chest, abdomen and pelvis was
performed following the standard protocol without IV contrast.

[Series 2: cap w/o · axial · non-contrast · 0.98mm/px · z∈[-636,-96]mm · 10 of 130 slices shown, 13 images]
[im 11/130  mediastinal]
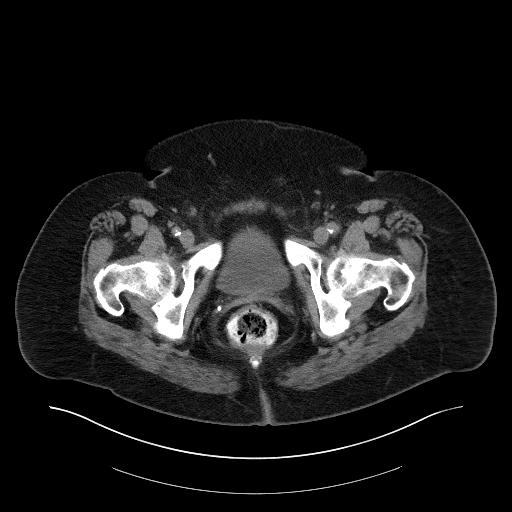
[im 11/130  lung]
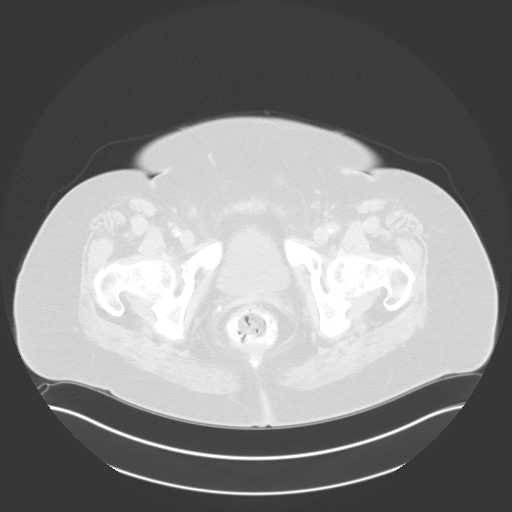
[im 22/130  lung]
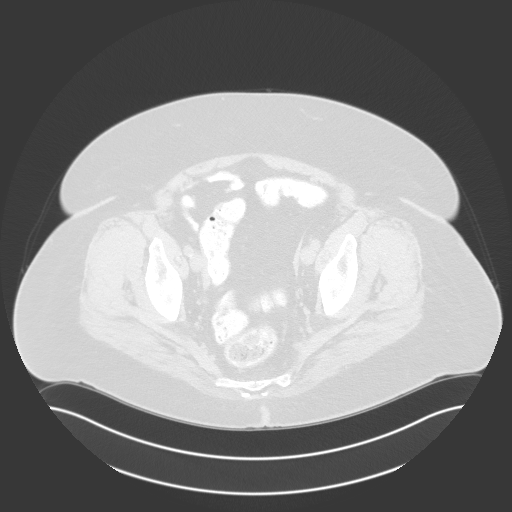
[im 33/130  lung]
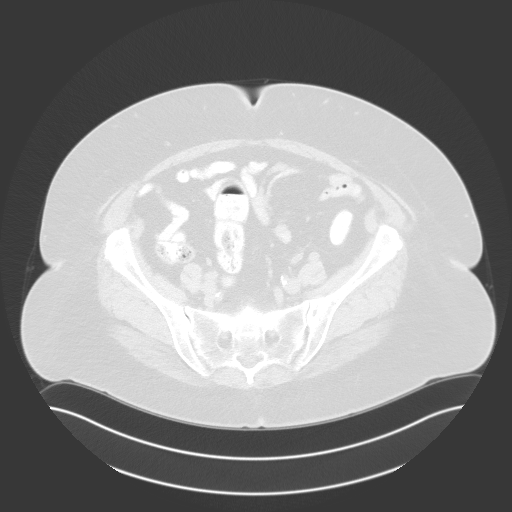
[im 44/130  lung]
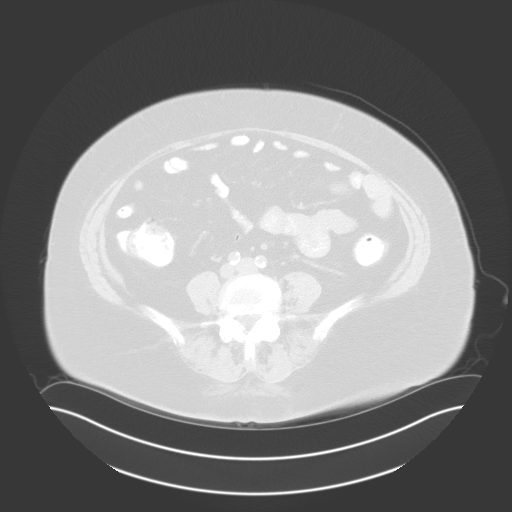
[im 54/130  mediastinal]
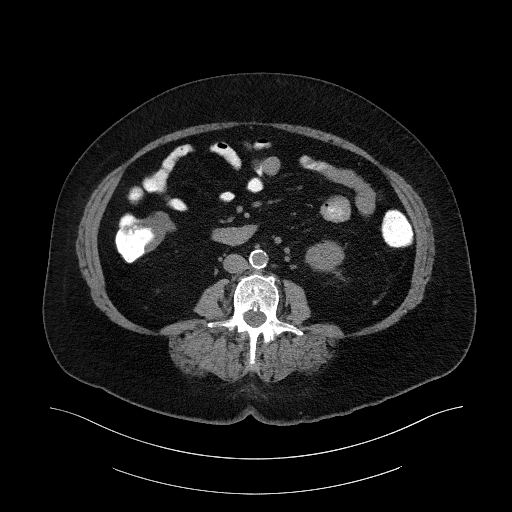
[im 54/130  lung]
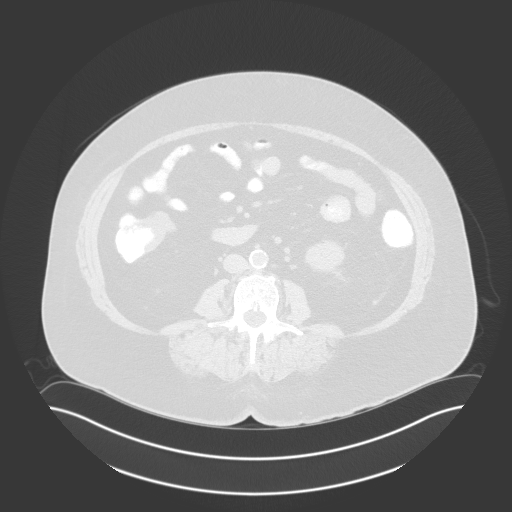
[im 76/130  lung]
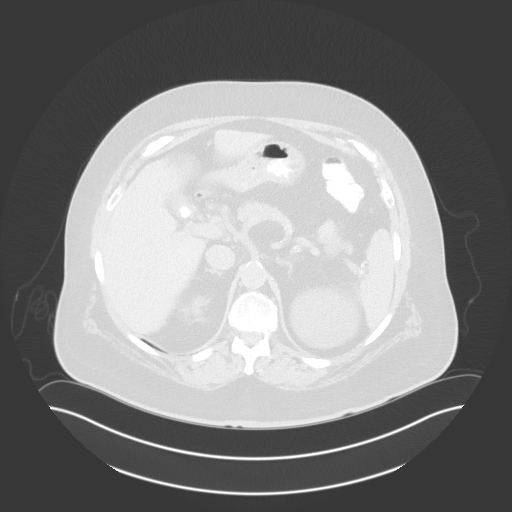
[im 87/130  lung]
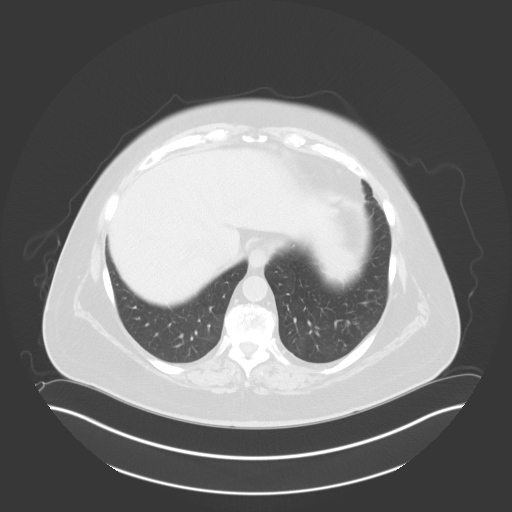
[im 97/130  lung]
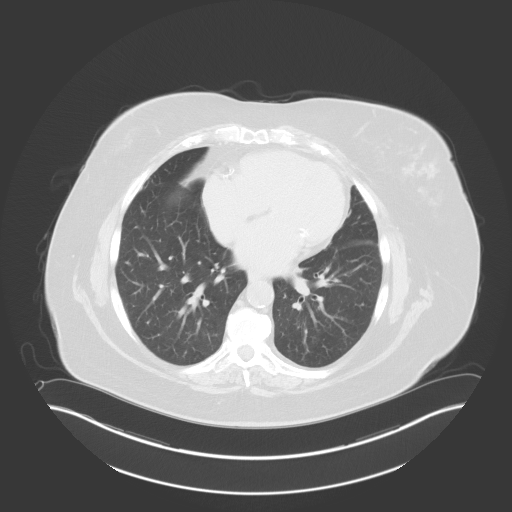
[im 108/130  mediastinal]
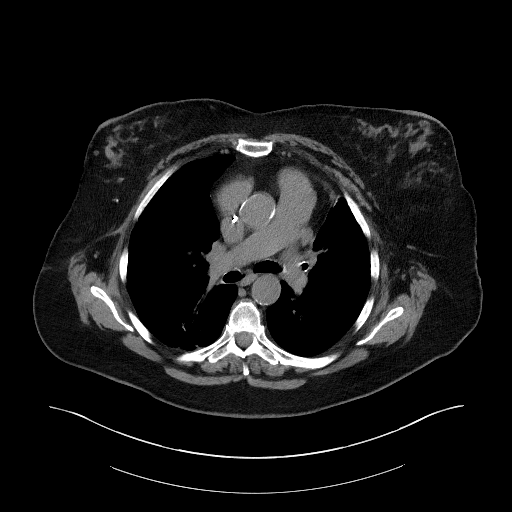
[im 108/130  lung]
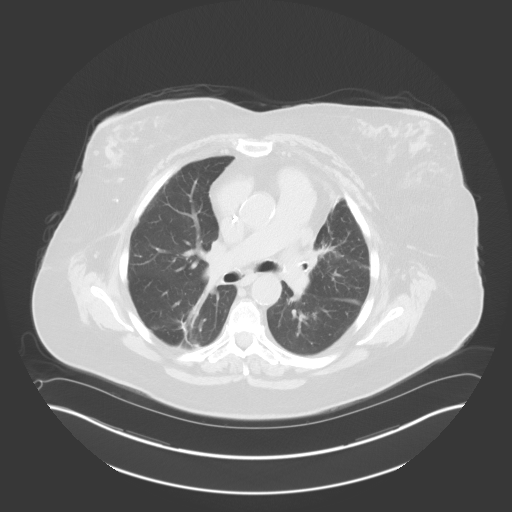
[im 119/130  lung]
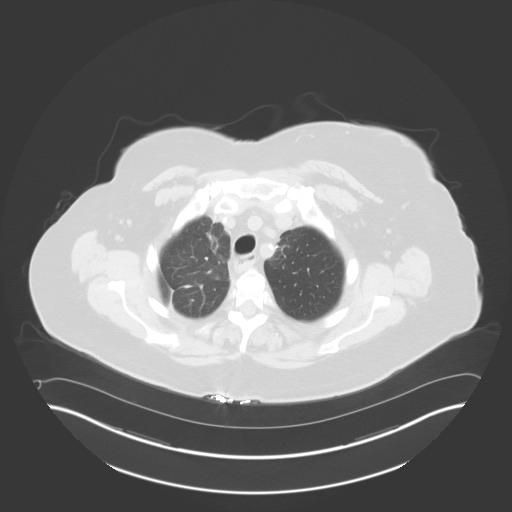

[Series 5: coronals · coronal · 0.94mm/px · 3 of 180 slices shown]
[im 36/180  lung]
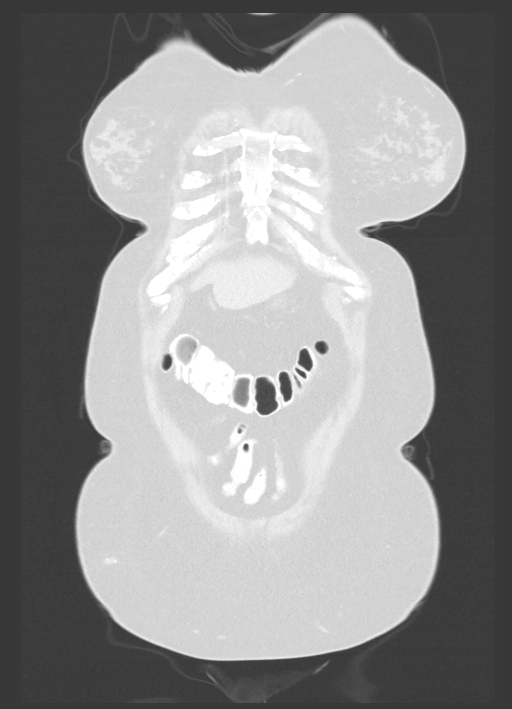
[im 72/180  lung]
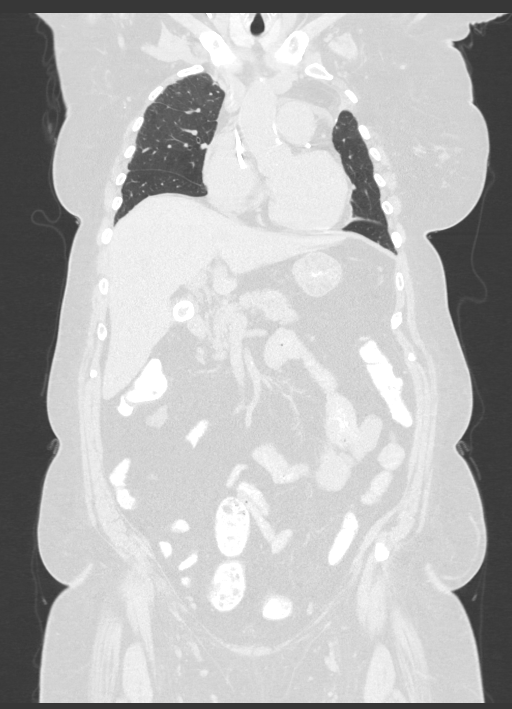
[im 108/180  lung]
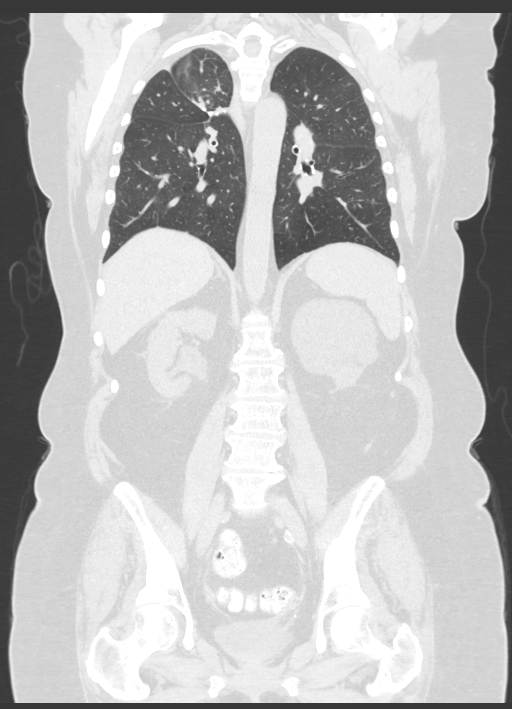

[13 of 36 positions shown; findings below may reference images not displayed]

FINDINGS: CT CHEST FINDINGS

Cardiovascular: Heart size upper normal. Coronary artery
calcification is noted. Atherosclerotic calcification is noted in
the wall of the thoracic aorta. Right-sided Port-A-Cath tip is in
the right atrium.

Mediastinum/Nodes: No mediastinal lymphadenopathy. No evidence for
gross hilar lymphadenopathy although assessment is limited by the
lack of intravenous contrast on today's study. The esophagus has
normal imaging features.

Lungs/Pleura: Similar 6 mm right apical nodule. The airspace
consolidation in the right apex has decreased in the interval. Soft
tissue tracking along the right upper lung suture line is similar to
prior. 7 mm posterior right lower lobe pulmonary nodule is
unchanged. Suture material anterior left lung is similar.
Peribronchovascular nodularity left lung base is stable to
progressive in the interval and may be related to atypical
infection.

Musculoskeletal: Bone windows reveal no worrisome lytic or sclerotic
osseous lesions.

CT ABDOMEN PELVIS FINDINGS

Hepatobiliary: 1.8 cm lesion in the medial segment left liver on the
prior study measures 1.7 cm today. 2.2 cm calcified gallstone. No
intrahepatic or extrahepatic biliary dilation.

Pancreas: No focal mass lesion. No dilatation of the main duct. No
intraparenchymal cyst. No peripancreatic edema.

Spleen: No splenomegaly. No focal mass lesion.

Adrenals/Urinary Tract: No adrenal nodule or mass. 2 x 5 mm
nonobstructing stone identified lower pole right kidney. A second 2
mm stone is seen in the lower pole the right kidney. No right
ureteral stone. Multiple cystic lesions identified left kidney
including dominant posterior exophytic upper pole lesion measuring
8.9 cm, similar to prior. 2 mm nonobstructing stone identified lower
pole left kidney. No left ureteral stone. The urinary bladder
appears normal for the degree of distention.

Stomach/Bowel: Stomach is nondistended. No gastric wall thickening.
No evidence of outlet obstruction. Duodenum is normally positioned
as is the ligament of Treitz. No small bowel wall thickening. No
small bowel dilatation. The terminal ileum is normal. The appendix
is normal. The colonic lumen is well opacified on today's study and
demonstrates a 3.9 cm mass along the medial wall of the ascending
colon about 6 cm distal to the ileocecal valve. Colon otherwise
unremarkable.

Vascular/Lymphatic: There is abdominal aortic atherosclerosis
without aneurysm. There is no gastrohepatic or hepatoduodenal
ligament lymphadenopathy. No intraperitoneal or retroperitoneal
lymphadenopathy. No pelvic sidewall lymphadenopathy.

Reproductive: Uterus surgically absent.  There is no adnexal mass.

Other: No intraperitoneal free fluid.

Musculoskeletal: Bone windows reveal no worrisome lytic or sclerotic
osseous lesions.
IMPRESSION: 1. Good opacification of the right colon on today's study revealing
a 3.9 cm mass lesion. Given the history of liver metastasis of
unknown primary, this may represent a site of primary tumor.
2. Stable appearance of the segment IV liver lesion.
3. Stable postsurgical changes in the lungs. The right upper lung
airspace consolidations seen on the prior study has resolved.
4. Stable pulmonary nodules.
5. Cholelithiasis.
6. Renal cysts and bilateral nonobstructing nephrolithiasis.
7.  Aortic Atherosclerois ([id number]-170.0)

## 2019-01-22 NOTE — Progress Notes (Signed)
Subjective:    Patient ID: Tammie [name], female    DOB: 04-Jul-1941, 78 y.o.   MRN: [id number]  Chief Complaint  Patient presents with  . Medical Management of Chronic Issues    three month recheck   PT presents to the office today for chronic follow up. She is followed by Oncologists every 3 months for hx of right breast cancer,  right lung cancer, and Colon cancer with mets to the liver. She is completed chemo in 11/2018. She is scheduled for CT in March 2020.  Hypertension  This is a chronic problem. The current episode started more than 1 year ago. The problem has been resolved since onset. The problem is controlled. Associated symptoms include malaise/fatigue, peripheral edema (improved) and shortness of breath. The current treatment provides moderate improvement. Hypertensive end-organ damage includes heart failure.  Asthma  She complains of difficulty breathing and shortness of breath. There is no wheezing. This is a chronic problem. The current episode started more than 1 year ago. The problem occurs intermittently. Associated symptoms include malaise/fatigue. Her symptoms are alleviated by rest. She reports moderate improvement on treatment. Her past medical history is significant for asthma.  Hyperlipidemia  This is a chronic problem. The current episode started more than 1 year ago. The problem is controlled. Recent lipid tests were reviewed and are normal. Exacerbating diseases include obesity. Associated symptoms include shortness of breath. Current antihyperlipidemic treatment includes statins. The current treatment provides mild improvement of lipids.  A Fib PT currently taking warfarin. Stable.    Review of Systems  Constitutional: Positive for malaise/fatigue.  Respiratory: Positive for shortness of breath. Negative for wheezing.   All other systems reviewed and are negative.   Family History  Problem Relation Age of Onset  . Throat cancer Mother        d.56 history of  smoking and alochol abuse  . Alcohol abuse Mother   . Early death Father 25       MVA  . Heart disease Brother   . Heart attack Brother   . Alcohol abuse Brother   . Hypertension Brother   . Alcohol abuse Brother   . Hypertension Brother   . Diabetes Brother   . Obesity Brother   . Bipolar disorder Daughter   . Early death Daughter 52       medication interaction with alcohol    Social History   Socioeconomic History  . Marital status: Widowed    Spouse name: Not on file  . Number of children: Not on file  . Years of education: Not on file  . Highest education level: Not on file  Occupational History  . Not on file  Social Needs  . Financial resource strain: Not on file  . Food insecurity:    Worry: Not on file    Inability: Not on file  . Transportation needs:    Medical: Not on file    Non-medical: Not on file  Tobacco Use  . Smoking status: Former Smoker    Packs/day: 3.00    Years: 32.00    Pack years: 96.00    Types: Cigarettes    Start date: 12/27/1989    Last attempt to quit: 03/08/1990    Years since quitting: 28.8  . Smokeless tobacco: Former Systems developer    Quit date: 03/08/1990  . Tobacco comment: smoked 3ppd from 1959-1991   Substance and Sexual Activity  . Alcohol use: No  . Drug use: No  . Sexual activity:  Never  Lifestyle  . Physical activity:    Days per week: Not on file    Minutes per session: Not on file  . Stress: Not on file  Relationships  . Social connections:    Talks on phone: Not on file    Gets together: Not on file    Attends religious service: Not on file    Active member of club or organization: Not on file    Attends meetings of clubs or organizations: Not on file    Relationship status: Not on file  Other Topics Concern  . Not on file  Social History Narrative   Widowed, lives with daughter (only child) and friend.    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    Cell # [id number]       Objective:    Physical Exam Vitals signs reviewed.  Constitutional:      General: She is not in acute distress.    Appearance: She is well-developed.  HENT:     Head: Normocephalic and atraumatic.  Eyes:     Pupils: Pupils are equal, round, and reactive to light.  Neck:     Musculoskeletal: Normal range of motion and neck supple.     Thyroid: No thyromegaly.  Cardiovascular:     Rate and Rhythm: Normal rate and regular rhythm.     Heart sounds: Normal heart sounds. No murmur.  Pulmonary:     Effort: Pulmonary effort is normal. No respiratory distress.     Breath sounds: Normal breath sounds. No wheezing.  Abdominal:     General: Bowel sounds are normal. There is no distension.     Palpations: Abdomen is soft.     Tenderness: There is no abdominal tenderness.  Musculoskeletal:        General: No tenderness.     Right lower leg: Edema (trace) present.     Left lower leg: Edema (trace) present.     Comments: Generalized weakness, discoloration of bilateral extremities   Skin:    General: Skin is warm and dry.  Neurological:     Mental Status: She is alert and oriented to person, place, and time.     Cranial Nerves: No cranial nerve deficit.     Deep Tendon Reflexes: Reflexes are normal and symmetric.  Psychiatric:        Behavior: Behavior normal.        Thought Content: Thought content normal.        Judgment: Judgment normal.       BP 139/72   Pulse 69   Temp (!) 97.1 F (36.2 C) (Oral)   Ht 5' 11" (1.803 m)   Wt 282 lb (127.9 kg)   BMI 39.33 kg/m      Assessment & Plan:  Tammie [name] comes in today with chief complaint of Medical Management of Chronic Issues (three month recheck)   Diagnosis and orders addressed:  1. Essential hypertension - CMP14+EGFR - CBC with Differential/Platelet  2. Atrial fibrillation, unspecified type [facility]) Description   Continue taking 2.59m a day except for 1/2 tablet on Friday.  INR today was 2.2 (goal is 2-3). Normal!      -  CMP14+EGFR - CBC with Differential/Platelet - CoaguChek XS/INR Waived  3. Uncomplicated asthma, unspecified asthma severity, unspecified whether persistent - CMP14+EGFR - CBC with Differential/Platelet  4. COPD with chronic bronchitis (HCC) - CMP14+EGFR - CBC with Differential/Platelet  5. Hyperlipidemia with target LDL less than 100 - CMP14+EGFR -  CBC with Differential/Platelet - Lipid panel  6. Primary adenocarcinoma of colon ([place]) - CMP14+EGFR - CBC with Differential/Platelet  7. Malignant neoplasm of upper-outer quadrant of right female breast, unspecified estrogen receptor status ([place]) - CMP14+EGFR - CBC with Differential/Platelet  8. 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 ([place]) - CMP14+EGFR - CBC with Differential/Platelet   Labs pending Health Maintenance reviewed Diet and exercise encouraged  Follow up plan: 6 months and 4 weeks to see clinical Pharm   [name], FNP

## 2019-01-22 NOTE — Patient Instructions (Signed)
Health Maintenance After Age 78 After age 78, you are at a higher risk for certain long-term diseases and infections as well as injuries from falls. Falls are a major cause of broken bones and head injuries in people who are older than age 78. Getting regular preventive care can help to keep you healthy and well. Preventive care includes getting regular testing and making lifestyle changes as recommended by your health care provider. Talk with your health care provider about:  Which screenings and tests you should have. A screening is a test that checks for a disease when you have no symptoms.  A diet and exercise plan that is right for you. What should I know about screenings and tests to prevent falls? Screening and testing are the best ways to find a health problem early. Early diagnosis and treatment give you the best chance of managing medical conditions that are common after age 78. Certain conditions and lifestyle choices may make you more likely to have a fall. Your health care provider may recommend:  Regular vision checks. Poor vision and conditions such as cataracts can make you more likely to have a fall. If you wear glasses, make sure to get your prescription updated if your vision changes.  Medicine review. Work with your health care provider to regularly review all of the medicines you are taking, including over-the-counter medicines. Ask your health care provider about any side effects that may make you more likely to have a fall. Tell your health care provider if any medicines that you take make you feel dizzy or sleepy.  Osteoporosis screening. Osteoporosis is a condition that causes the bones to get weaker. This can make the bones weak and cause them to break more easily.  Blood pressure screening. Blood pressure changes and medicines to control blood pressure can make you feel dizzy.  Strength and balance checks. Your health care provider may recommend certain tests to check your  strength and balance while standing, walking, or changing positions.  Foot health exam. Foot pain and numbness, as well as not wearing proper footwear, can make you more likely to have a fall.  Depression screening. You may be more likely to have a fall if you have a fear of falling, feel emotionally low, or feel unable to do activities that you used to do.  Alcohol use screening. Using too much alcohol can affect your balance and may make you more likely to have a fall. What actions can I take to lower my risk of falls? General instructions  Talk with your health care provider about your risks for falling. Tell your health care provider if: ? You fall. Be sure to tell your health care provider about all falls, even ones that seem minor. ? You feel dizzy, sleepy, or off-balance.  Take over-the-counter and prescription medicines only as told by your health care provider. These include any supplements.  Eat a healthy diet and maintain a healthy weight. A healthy diet includes low-fat dairy products, low-fat (lean) meats, and fiber from whole grains, beans, and lots of fruits and vegetables. Home safety  Remove any tripping hazards, such as rugs, cords, and clutter.  Install safety equipment such as grab bars in bathrooms and safety rails on stairs.  Keep rooms and walkways well-lit. Activity   Follow a regular exercise program to stay fit. This will help you maintain your balance. Ask your health care provider what types of exercise are appropriate for you.  If you need a cane or   walker, use it as recommended by your health care provider.  Wear supportive shoes that have nonskid soles. Lifestyle  Do not drink alcohol if your health care provider tells you not to drink.  If you drink alcohol, limit how much you have: ? 0-1 drink a day for women. ? 0-2 drinks a day for men.  Be aware of how much alcohol is in your drink. In the [place] one drink equals one typical bottle of beer (12  oz), one-half glass of wine (5 oz), or one shot of hard liquor (1 oz).  Do not use any products that contain nicotine or tobacco, such as cigarettes and e-cigarettes. If you need help quitting, ask your health care provider. Summary  Having a healthy lifestyle and getting preventive care can help to protect your health and wellness after age 78.  Screening and testing are the best way to find a health problem early and help you avoid having a fall. Early diagnosis and treatment give you the best chance for managing medical conditions that are more common for people who are older than age 78.  Falls are a major cause of broken bones and head injuries in people who are older than age 78. Take precautions to prevent a fall at home.  Work with your health care provider to learn what changes you can make to improve your health and wellness and to prevent falls. This information is not intended to replace advice given to you by your health care provider. Make sure you discuss any questions you have with your health care provider. Document Released: 10/26/2017 Document Revised: 10/26/2017 Document Reviewed: 10/26/2017 Elsevier Interactive Patient Education  2019 [employer].  

## 2019-01-23 ENCOUNTER — Other Ambulatory Visit: Payer: Self-pay | Admitting: Family

## 2019-01-23 LAB — CBC WITH DIFFERENTIAL/PLATELET
Basophils Absolute: 0.1 10*3/uL (ref 0.0–0.2)
Basos: 1 %
EOS (ABSOLUTE): 0.2 10*3/uL (ref 0.0–0.4)
Eos: 4 %
Hematocrit: 43 % (ref 34.0–46.6)
Hemoglobin: 14.4 g/dL (ref 11.1–15.9)
Immature Grans (Abs): 0 10*3/uL (ref 0.0–0.1)
Immature Granulocytes: 0 %
Lymphocytes Absolute: 1 10*3/uL (ref 0.7–3.1)
Lymphs: 18 %
MCH: 28.6 pg (ref 26.6–33.0)
MCHC: 33.5 g/dL (ref 31.5–35.7)
MCV: 85 fL (ref 79–97)
Monocytes Absolute: 0.5 10*3/uL (ref 0.1–0.9)
Monocytes: 10 %
Neutrophils Absolute: 3.6 10*3/uL (ref 1.4–7.0)
Neutrophils: 67 %
Platelets: 162 10*3/uL (ref 150–450)
RBC: 5.04 x10E6/uL (ref 3.77–5.28)
RDW: 13.5 % (ref 11.7–15.4)
WBC: 5.4 10*3/uL (ref 3.4–10.8)

## 2019-01-23 LAB — CMP14+EGFR
A/G RATIO: 1.9 (ref 1.2–2.2)
ALT: 11 IU/L (ref 0–32)
AST: 14 IU/L (ref 0–40)
Albumin: 4.2 g/dL (ref 3.7–4.7)
Alkaline Phosphatase: 44 IU/L (ref 39–117)
BUN/Creatinine Ratio: 16 (ref 12–28)
BUN: 23 mg/dL (ref 8–27)
Bilirubin Total: 0.5 mg/dL (ref 0.0–1.2)
CO2: 24 mmol/L (ref 20–29)
Calcium: 8.9 mg/dL (ref 8.7–10.3)
Chloride: 103 mmol/L (ref 96–106)
Creatinine, Ser: 1.4 mg/dL — ABNORMAL HIGH (ref 0.57–1.00)
GFR calc Af Amer: 42 mL/min/{1.73_m2} — ABNORMAL LOW (ref >59)
GFR calc non Af Amer: 36 mL/min/{1.73_m2} — ABNORMAL LOW (ref >59)
Globulin, Total: 2.2 g/dL (ref 1.5–4.5)
Glucose: 86 mg/dL (ref 65–99)
Potassium: 5.3 mmol/L — ABNORMAL HIGH (ref 3.5–5.2)
SODIUM: 144 mmol/L (ref 134–144)
Total Protein: 6.4 g/dL (ref 6.0–8.5)

## 2019-01-23 LAB — LIPID PANEL
Chol/HDL Ratio: 3.2 ratio (ref 0.0–4.4)
Cholesterol, Total: 142 mg/dL (ref 100–199)
HDL: 44 mg/dL (ref >39)
LDL Calculated: 58 mg/dL (ref 0–99)
Triglycerides: 199 mg/dL — ABNORMAL HIGH (ref 0–149)
VLDL Cholesterol Cal: 40 mg/dL (ref 5–40)

## 2019-02-01 ENCOUNTER — Other Ambulatory Visit: Payer: PPO

## 2019-02-01 ENCOUNTER — Ambulatory Visit: Payer: PPO

## 2019-02-01 ENCOUNTER — Ambulatory Visit: Payer: PPO | Admitting: Internal Medicine

## 2019-02-14 ENCOUNTER — Ambulatory Visit: Payer: PPO | Admitting: Pediatrics

## 2019-02-28 ENCOUNTER — Other Ambulatory Visit: Payer: Self-pay | Admitting: Pediatrics

## 2019-02-28 DIAGNOSIS — I482 Chronic atrial fibrillation, unspecified: Secondary | ICD-10-CM

## 2019-02-28 DIAGNOSIS — I10 Essential (primary) hypertension: Secondary | ICD-10-CM

## 2019-02-28 DIAGNOSIS — M7989 Other specified soft tissue disorders: Secondary | ICD-10-CM

## 2019-03-07 ENCOUNTER — Telehealth: Payer: Self-pay | Admitting: Family

## 2019-03-07 NOTE — Telephone Encounter (Signed)
ok 

## 2019-03-13 ENCOUNTER — Telehealth: Payer: Self-pay | Admitting: *Deleted

## 2019-03-13 NOTE — Telephone Encounter (Signed)
Received TC from patient asking if it is ok for her to get her CT Scan on 03/22/19 given the current covid-19 situation. And then see [name] in April.  Discussed with [name]. He felt it is ok for pt to have her scan and f/u appt.  Advised the same to the patient and she voiced understanding. Informed her that the hiospital and clinics are all taking appropriate precautions.

## 2019-03-22 ENCOUNTER — Other Ambulatory Visit: Payer: Self-pay

## 2019-03-22 ENCOUNTER — Inpatient Hospital Stay: Payer: PPO

## 2019-03-22 ENCOUNTER — Inpatient Hospital Stay: Payer: PPO | Attending: Internal Medicine

## 2019-03-22 ENCOUNTER — Ambulatory Visit (HOSPITAL_COMMUNITY)
Admission: RE | Admit: 2019-03-22 | Discharge: 2019-03-22 | Disposition: A | Discharge status: Home / Self Care | Payer: PPO | Source: Ambulatory Visit | Attending: Internal Medicine | Admitting: Internal Medicine

## 2019-03-22 DIAGNOSIS — Z95828 Presence of other vascular implants and grafts: Secondary | ICD-10-CM

## 2019-03-22 DIAGNOSIS — C182 Malignant neoplasm of ascending colon: Secondary | ICD-10-CM | POA: Diagnosis not present

## 2019-03-22 DIAGNOSIS — C3490 Malignant neoplasm of unspecified part of unspecified bronchus or lung: Secondary | ICD-10-CM | POA: Insufficient documentation

## 2019-03-22 DIAGNOSIS — R5383 Other fatigue: Secondary | ICD-10-CM | POA: Insufficient documentation

## 2019-03-22 DIAGNOSIS — K8020 Calculus of gallbladder without cholecystitis without obstruction: Secondary | ICD-10-CM | POA: Diagnosis not present

## 2019-03-22 DIAGNOSIS — Z5112 Encounter for antineoplastic immunotherapy: Secondary | ICD-10-CM

## 2019-03-22 DIAGNOSIS — C3431 Malignant neoplasm of lower lobe, right bronchus or lung: Secondary | ICD-10-CM

## 2019-03-22 DIAGNOSIS — R918 Other nonspecific abnormal finding of lung field: Secondary | ICD-10-CM | POA: Diagnosis not present

## 2019-03-22 DIAGNOSIS — N200 Calculus of kidney: Secondary | ICD-10-CM | POA: Diagnosis not present

## 2019-03-22 LAB — CMP (CANCER CENTER ONLY)
ALT: 11 U/L (ref 0–44)
AST: 14 U/L — ABNORMAL LOW (ref 15–41)
Albumin: 3.7 g/dL (ref 3.5–5.0)
Alkaline Phosphatase: 53 U/L (ref 38–126)
Anion gap: 9 (ref 5–15)
BUN: 26 mg/dL — ABNORMAL HIGH (ref 8–23)
CO2: 27 mmol/L (ref 22–32)
Calcium: 8.9 mg/dL (ref 8.9–10.3)
Chloride: 105 mmol/L (ref 98–111)
Creatinine: 1.43 mg/dL — ABNORMAL HIGH (ref 0.44–1.00)
GFR, Est AFR Am: 41 mL/min — ABNORMAL LOW (ref >60)
GFR, Estimated: 35 mL/min — ABNORMAL LOW (ref >60)
Glucose, Bld: 99 mg/dL (ref 70–99)
Potassium: 5.1 mmol/L (ref 3.5–5.1)
Sodium: 141 mmol/L (ref 135–145)
Total Bilirubin: 0.5 mg/dL (ref 0.3–1.2)
Total Protein: 6.8 g/dL (ref 6.5–8.1)

## 2019-03-22 LAB — CBC WITH DIFFERENTIAL (CANCER CENTER ONLY)
ABS IMMATURE GRANULOCYTES: 0.02 10*3/uL (ref 0.00–0.07)
Basophils Absolute: 0.1 10*3/uL (ref 0.0–0.1)
Basophils Relative: 1 %
Eosinophils Absolute: 0.2 10*3/uL (ref 0.0–0.5)
Eosinophils Relative: 4 %
HCT: 48 % — ABNORMAL HIGH (ref 36.0–46.0)
Hemoglobin: 15.1 g/dL — ABNORMAL HIGH (ref 12.0–15.0)
Immature Granulocytes: 0 %
LYMPHS ABS: 1.1 10*3/uL (ref 0.7–4.0)
Lymphocytes Relative: 20 %
MCH: 28.4 pg (ref 26.0–34.0)
MCHC: 31.5 g/dL (ref 30.0–36.0)
MCV: 90.4 fL (ref 80.0–100.0)
MONOS PCT: 8 %
Monocytes Absolute: 0.4 10*3/uL (ref 0.1–1.0)
Neutro Abs: 3.6 10*3/uL (ref 1.7–7.7)
Neutrophils Relative %: 67 %
Platelet Count: 157 10*3/uL (ref 150–400)
RBC: 5.31 MIL/uL — ABNORMAL HIGH (ref 3.87–5.11)
RDW: 13.2 % (ref 11.5–15.5)
WBC Count: 5.4 10*3/uL (ref 4.0–10.5)
nRBC: 0 % (ref 0.0–0.2)

## 2019-03-22 LAB — CEA (IN HOUSE-CHCC): CEA (CHCC-In House): 2.01 ng/mL (ref 0.00–5.00)

## 2019-03-22 LAB — TSH: TSH: 2.275 u[IU]/mL (ref 0.308–3.960)

## 2019-03-22 MED ORDER — HEPARIN SOD (PORK) LOCK FLUSH 100 UNIT/ML IV SOLN
500.0000 [IU] | Freq: Once | INTRAVENOUS | Status: DC | PRN
  Filled 2019-03-22: qty 5

## 2019-03-22 MED ORDER — SODIUM CHLORIDE 0.9% FLUSH
10.0000 mL | INTRAVENOUS | Status: DC | PRN
  Administered 2019-03-22: 10 mL
  Filled 2019-03-22: qty 10

## 2019-03-22 MED ORDER — HEPARIN SOD (PORK) LOCK FLUSH 100 UNIT/ML IV SOLN
INTRAVENOUS | Status: AC
  Administered 2019-03-22: 500 [IU] via INTRAVENOUS
  Filled 2019-03-22: qty 5

## 2019-03-22 MED ORDER — HEPARIN SOD (PORK) LOCK FLUSH 100 UNIT/ML IV SOLN
500.0000 [IU] | Freq: Once | INTRAVENOUS | Status: AC
  Administered 2019-03-22: 500 [IU] via INTRAVENOUS

## 2019-03-25 IMAGING — CT CT CHEST W/O CM
2 of 4 series · 11 of 36 positions shown, 13 images · non-contrast
Comparison: 07/06/2017 CT chest, abdomen and pelvis.

CLINICAL DATA: Status post left upper lobe wedge resection for
stage IA lung adenocarcinoma 10/01/2011. Status post conservation
therapy for right breast cancer in 9444-15. Status post right upper
and right lower lobe wedge resections 07/16/2014 for stage IIIA lung
adenocarcinoma. Ascending colon adenocarcinoma with liver metastasis
diagnosed September 2016. Patient presents for restaging with ongoing
immunotherapy.

EXAM:
CT CHEST, ABDOMEN AND PELVIS WITHOUT CONTRAST
TECHNIQUE: Multidetector CT imaging of the chest, abdomen and pelvis was
performed following the standard protocol without IV contrast.

[Series 2: cap w/o · axial · non-contrast · 0.86mm/px · z∈[-296,+274]mm · 8 of 136 slices shown, 10 images]
[im 11/136  mediastinal]
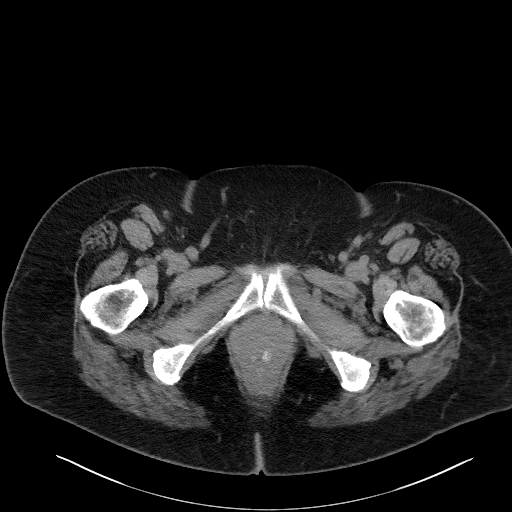
[im 11/136  lung]
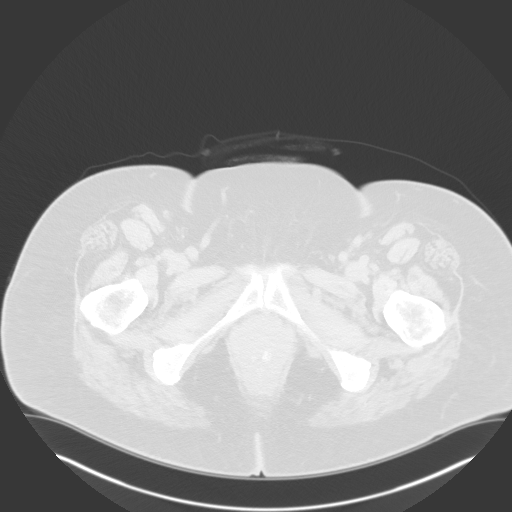
[im 32/136  lung]
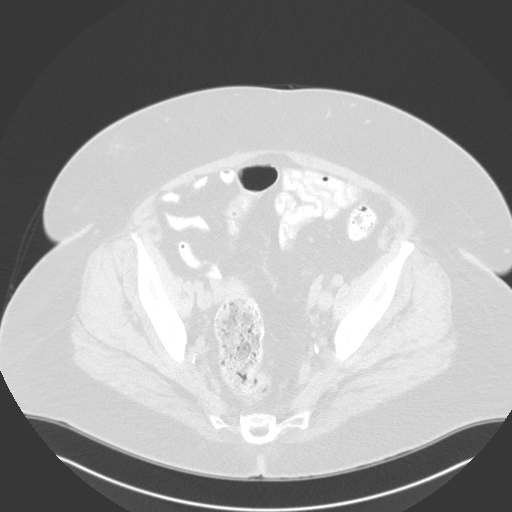
[im 42/136  lung]
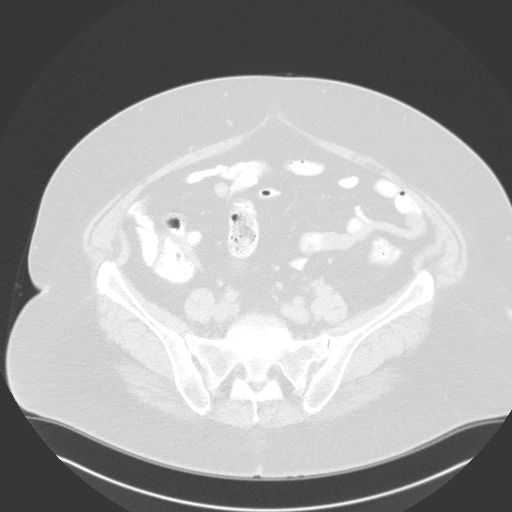
[im 63/136  lung]
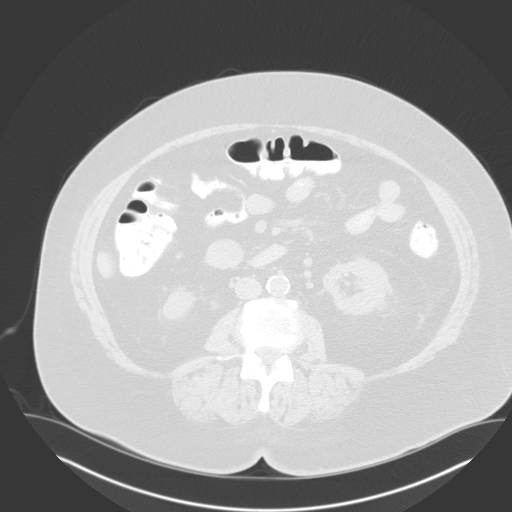
[im 73/136  mediastinal]
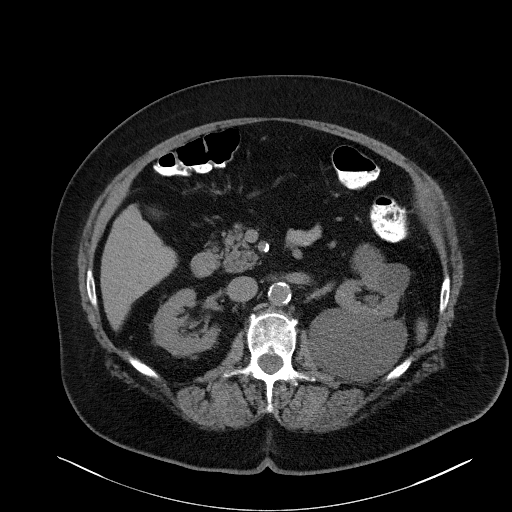
[im 73/136  lung]
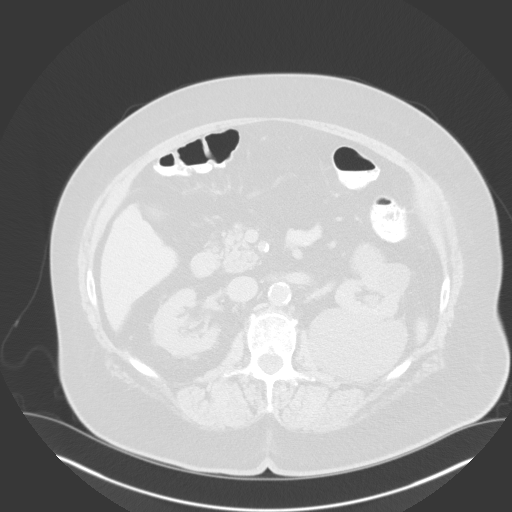
[im 94/136  lung]
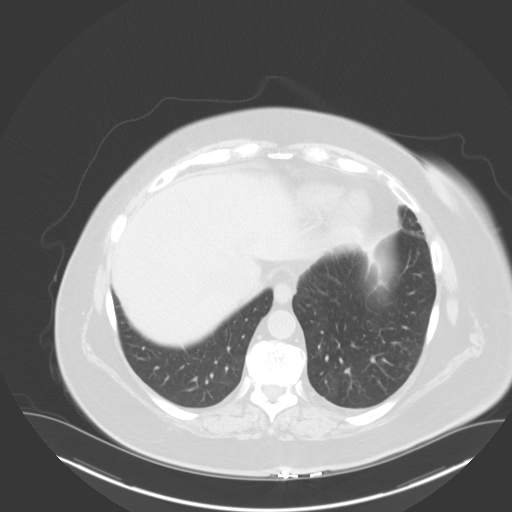
[im 104/136  lung]
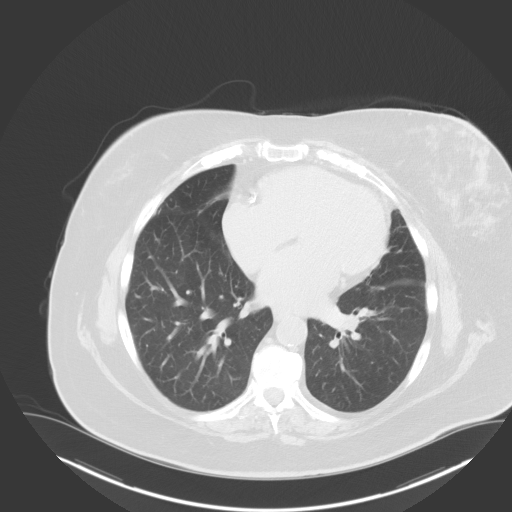
[im 125/136  lung]
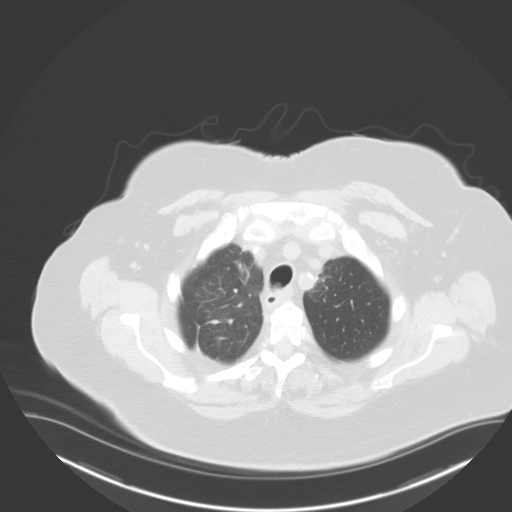

[Series 4: coronals · coronal · 0.87mm/px · 3 of 140 slices shown]
[im 28/140  lung]
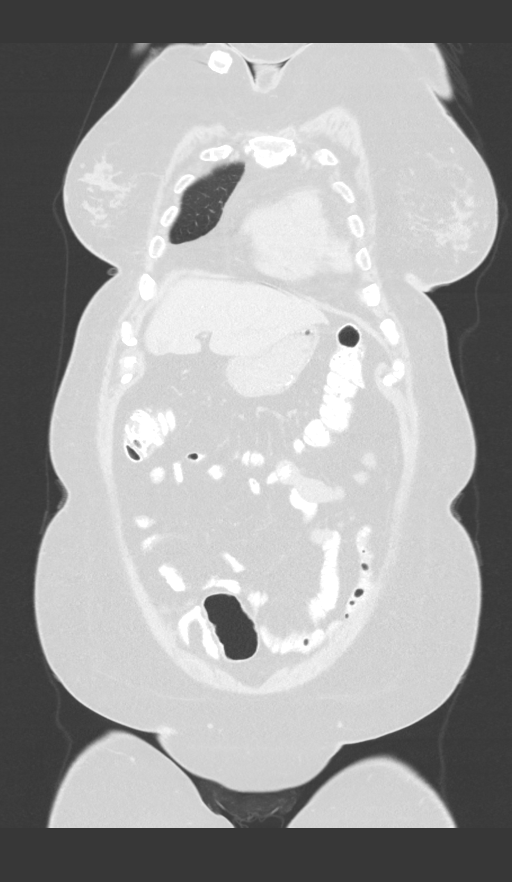
[im 56/140  lung]
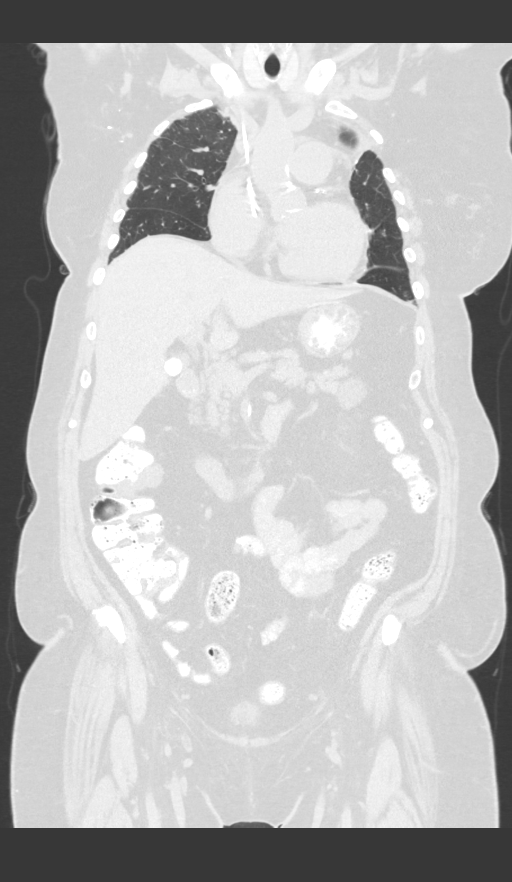
[im 84/140  lung]
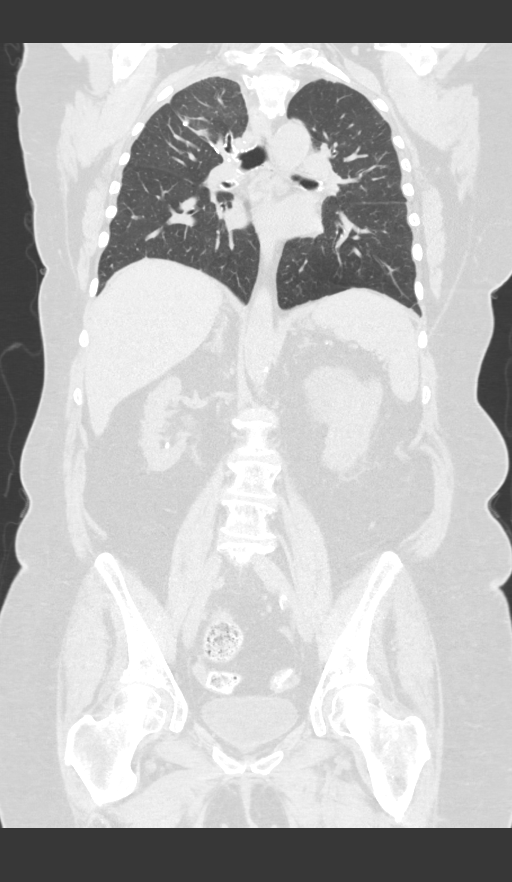

[11 of 36 positions shown; findings below may reference images not displayed]

FINDINGS: CT CHEST FINDINGS

Cardiovascular: Normal heart size. No significant pericardial
fluid/thickening. Left main, left anterior descending, left
circumflex and right coronary atherosclerosis. Right internal
jugular MediPort terminates in the right atrium near the cavoatrial
junction . Atherosclerotic nonaneurysmal thoracic aorta. Stable
top-normal caliber main pulmonary artery (3.3 cm diameter).

Mediastinum/Nodes: Stable subcentimeter hypodense upper left thyroid
nodule. Unremarkable esophagus. No pathologically enlarged axillary,
mediastinal or gross hilar lymph nodes, noting limited sensitivity
for the detection of hilar adenopathy on this noncontrast study.
Surgical clips are again noted in the right axilla.

Lungs/Pleura: No pneumothorax. No pleural effusion. Stable
parenchymal bands along the wedge resection suture lines in the
bilateral upper lobes compatible with postsurgical scarring. No
acute consolidative airspace disease or lung masses. A few scattered
subsolid pulmonary nodules in the right lung measuring up to 0.7 cm
in the posterior right lower lobe (series 6/image 82) are all stable
since 07/06/2017. Patchy tree-in-bud opacities in the peripheral
left lower lobe (series 6/image 101) and mild patchy
peribronchovascular ground-glass attenuation in the posterior left
upper lobe and superior segment left lower lobe are new/ mildly
increased. No new significant pulmonary nodules.

Musculoskeletal: No aggressive appearing focal osseous lesions.
Moderate thoracic spondylosis. Surgical clips are again noted in the
outer right breast.

CT ABDOMEN PELVIS FINDINGS

Hepatobiliary: Normal liver size. Hypodense 1.3 x 1.1 cm segment 4B
left liver lobe mass (series 2/ image 53), previously 1.2 x 0.9 cm,
stable to minimally increased. No additional liver lesions.
Cholelithiasis, with no gallbladder wall thickening, gallbladder
distention or pericholecystic fluid. No biliary ductal dilatation.

Pancreas: Normal, with no mass or duct dilation.

Spleen: Normal size. No mass.

Adrenals/Urinary Tract: Normal adrenals. Nonobstructing 4 mm and 2
mm lower right renal stones. Nonobstructing 2 mm lower left renal
stone. No hydronephrosis. Multiple simple left renal cysts, largest
an exophytic 8.9 cm posterior upper left renal cyst. No contour
deforming right renal lesions. Normal bladder.

Stomach/Bowel: Grossly normal stomach. Normal caliber small bowel
with no small bowel wall thickening. Normal appendix. Medial
ascending colonic 4.2 x 3.1 cm mass (series 2/ image 77), previously
4.2 x 3.2 cm using similar measurement technique, stable. No
additional sites of large bowel wall thickening. Oral contrast
reaches the distal rectum.

Vascular/Lymphatic: Atherosclerotic nonaneurysmal abdominal aorta.
Stable mildly enlarged 1.4 cm porta hepatis node (series 2/ image
51). No additional pathologically enlarged abdominopelvic nodes.

Reproductive: Status post hysterectomy, with no abnormal findings at
the vaginal cuff. No adnexal mass.

Other: No pneumoperitoneum, ascites or focal fluid collection.

Musculoskeletal: No aggressive appearing focal osseous lesions.
Moderate lumbar spondylosis.
IMPRESSION: 1. Solitary small segment 4B left liver lobe metastasis is stable to
minimally increased in size.
2. Stable mild porta hepatis lymphadenopathy.
3. Known ascending colonic tumor is stable.
4. No new sites of metastatic disease in the chest, abdomen or
pelvis.
5. Scattered subsolid right pulmonary nodules are stable.
6. Patchy tree-in-bud and ground-glass opacities in the left lung,
new/increased, most compatible with nonspecific mild infectious or
inflammatory etiology. Recommend attention on follow-up surveillance
chest CT.
7. Left main and 3 vessel coronary atherosclerosis. Aortic
Atherosclerosis ([id number]).
8. Nonobstructing bilateral nephrolithiasis and cholelithiasis.

## 2019-03-26 ENCOUNTER — Telehealth: Payer: Self-pay | Admitting: Medical Oncology

## 2019-03-26 NOTE — Telephone Encounter (Signed)
Requests evisit instead of coming in . Evisit scheduled.

## 2019-03-29 ENCOUNTER — Inpatient Hospital Stay: Payer: PPO | Attending: Internal Medicine | Admitting: Internal Medicine

## 2019-03-29 DIAGNOSIS — C229 Malignant neoplasm of liver, not specified as primary or secondary: Secondary | ICD-10-CM

## 2019-03-29 DIAGNOSIS — I10 Essential (primary) hypertension: Secondary | ICD-10-CM

## 2019-03-29 DIAGNOSIS — C3490 Malignant neoplasm of unspecified part of unspecified bronchus or lung: Secondary | ICD-10-CM

## 2019-03-29 DIAGNOSIS — Z853 Personal history of malignant neoplasm of breast: Secondary | ICD-10-CM

## 2019-03-29 DIAGNOSIS — I4891 Unspecified atrial fibrillation: Secondary | ICD-10-CM

## 2019-03-29 DIAGNOSIS — Z7901 Long term (current) use of anticoagulants: Secondary | ICD-10-CM | POA: Diagnosis not present

## 2019-03-29 DIAGNOSIS — Z85038 Personal history of other malignant neoplasm of large intestine: Secondary | ICD-10-CM | POA: Diagnosis not present

## 2019-03-29 DIAGNOSIS — C3431 Malignant neoplasm of lower lobe, right bronchus or lung: Secondary | ICD-10-CM

## 2019-03-29 DIAGNOSIS — Z79899 Other long term (current) drug therapy: Secondary | ICD-10-CM
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
## 2019-04-03 ENCOUNTER — Telehealth: Payer: Self-pay | Admitting: Internal Medicine

## 2019-04-03 NOTE — Telephone Encounter (Signed)
Scheduled apt per 4/2 sch message - sent reminder letter in the mail.

## 2019-05-01 ENCOUNTER — Telehealth: Payer: Self-pay | Admitting: Medical Oncology

## 2019-05-01 NOTE — Telephone Encounter (Signed)
appt given to pt .

## 2019-05-01 NOTE — Telephone Encounter (Signed)
She can come for [facility] if symptoms persist.

## 2019-05-01 NOTE — Telephone Encounter (Signed)
Pain R shoulder from top of shoulder radiates to  blade and around under right breast accompainied by nausea. The pain woke her up at 7 am with an intensity 10/10. She took tylenol and it did not help. It is easing off now but not completely gone. [name] to advise. Next scan and f/u inJuly.

## 2019-05-02 ENCOUNTER — Other Ambulatory Visit: Payer: Self-pay

## 2019-05-02 ENCOUNTER — Inpatient Hospital Stay: Payer: PPO | Attending: Internal Medicine | Admitting: Medical

## 2019-05-02 VITALS — BP 168/85 | HR 72 | Temp 97.9°F | Resp 16 | Ht 71.0 in | Wt 278.1 lb

## 2019-05-02 DIAGNOSIS — L309 Dermatitis, unspecified: Secondary | ICD-10-CM

## 2019-05-02 DIAGNOSIS — C787 Secondary malignant neoplasm of liver and intrahepatic bile duct: Secondary | ICD-10-CM

## 2019-05-02 DIAGNOSIS — R1010 Upper abdominal pain, unspecified: Secondary | ICD-10-CM | POA: Diagnosis not present

## 2019-05-02 DIAGNOSIS — I872 Venous insufficiency (chronic) (peripheral): Secondary | ICD-10-CM

## 2019-05-02 DIAGNOSIS — Z87891 Personal history of nicotine dependence: Secondary | ICD-10-CM | POA: Diagnosis not present

## 2019-05-02 DIAGNOSIS — C182 Malignant neoplasm of ascending colon: Secondary | ICD-10-CM | POA: Insufficient documentation

## 2019-05-02 DIAGNOSIS — R600 Localized edema: Secondary | ICD-10-CM | POA: Diagnosis not present

## 2019-05-02 DIAGNOSIS — C3431 Malignant neoplasm of lower lobe, right bronchus or lung: Secondary | ICD-10-CM | POA: Diagnosis not present

## 2019-05-02 MED ORDER — NAPROXEN 250 MG PO TABS: 250.0000 mg | ORAL_TABLET | Freq: Every day | ORAL | 2 refills | Status: DC

## 2019-05-02 MED ORDER — TRIAMCINOLONE ACETONIDE 0.1 % EX CREA: 1.0000 "application " | TOPICAL_CREAM | Freq: Two times a day (BID) | CUTANEOUS | 2 refills | Status: DC

## 2019-05-04 ENCOUNTER — Telehealth: Payer: Self-pay | Admitting: Emergency Medicine

## 2019-05-04 NOTE — Telephone Encounter (Signed)
Returning phone call from pharmacy, confirmed that pt has stopped warfarin and therefore is ok to start Naproxen prescribed by PA Sandi Mealy.
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
## 2019-05-11 ENCOUNTER — Ambulatory Visit (INDEPENDENT_AMBULATORY_CARE_PROVIDER_SITE_OTHER): Payer: PPO | Admitting: Family Medicine

## 2019-05-11 ENCOUNTER — Emergency Department (HOSPITAL_COMMUNITY)
Admission: EM | Admit: 2019-05-11 | Discharge: 2019-05-11 | Disposition: A | Discharge status: Home / Self Care | Payer: PPO | Attending: Emergency Medicine | Admitting: Emergency Medicine

## 2019-05-11 ENCOUNTER — Other Ambulatory Visit: Payer: Self-pay

## 2019-05-11 ENCOUNTER — Encounter (HOSPITAL_COMMUNITY): Payer: Self-pay | Admitting: Emergency Medicine

## 2019-05-11 ENCOUNTER — Encounter: Payer: Self-pay | Admitting: Family Medicine

## 2019-05-11 ENCOUNTER — Emergency Department (HOSPITAL_COMMUNITY): Payer: PPO

## 2019-05-11 DIAGNOSIS — C189 Malignant neoplasm of colon, unspecified: Secondary | ICD-10-CM

## 2019-05-11 DIAGNOSIS — R1031 Right lower quadrant pain: Secondary | ICD-10-CM

## 2019-05-11 DIAGNOSIS — K625 Hemorrhage of anus and rectum: Secondary | ICD-10-CM | POA: Diagnosis not present

## 2019-05-11 DIAGNOSIS — R112 Nausea with vomiting, unspecified: Secondary | ICD-10-CM | POA: Diagnosis not present

## 2019-05-11 DIAGNOSIS — N201 Calculus of ureter: Secondary | ICD-10-CM | POA: Diagnosis not present

## 2019-05-11 DIAGNOSIS — N132 Hydronephrosis with renal and ureteral calculous obstruction: Secondary | ICD-10-CM | POA: Diagnosis not present

## 2019-05-11 DIAGNOSIS — N281 Cyst of kidney, acquired: Secondary | ICD-10-CM | POA: Diagnosis not present

## 2019-05-11 LAB — URINALYSIS, ROUTINE W REFLEX MICROSCOPIC
Bacteria, UA: NONE SEEN
Bilirubin Urine: NEGATIVE
Glucose, UA: NEGATIVE mg/dL
Ketones, ur: NEGATIVE mg/dL
Leukocytes,Ua: NEGATIVE
Nitrite: NEGATIVE
Protein, ur: 30 mg/dL — AB
RBC / HPF: >50 RBC/hpf — ABNORMAL HIGH (ref 0–5)
Specific Gravity, Urine: 1.015 (ref 1.005–1.030)
pH: 5 (ref 5.0–8.0)

## 2019-05-11 MED ORDER — HYDROCODONE-ACETAMINOPHEN 5-325 MG PO TABS: 1.0000 | ORAL_TABLET | ORAL | 0 refills | Status: DC | PRN

## 2019-05-11 MED ORDER — PROMETHAZINE HCL 25 MG PO TABS: 25.0000 mg | ORAL_TABLET | Freq: Four times a day (QID) | ORAL | 0 refills | Status: DC | PRN

## 2019-05-11 NOTE — Patient Instructions (Signed)
Pt to go to ED for evaluation and treatment 
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
## 2019-05-11 NOTE — ED Triage Notes (Signed)
Woke up this am with right flank pain, radiating to right lower abdomen.  Vomited 3 or 4 times this am.  C/o constipation, last BM yesterday had hard forceful stool, stool is black but takes iron pills.  Denies fever, or any other pain.Marland Kitchen
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
## 2019-05-11 NOTE — Discharge Instructions (Addendum)
Follow up with your urologist if pain persists.   Take medications as prescribed. Return here as needed.

## 2019-05-22 IMAGING — CT CT CHEST W/O CM
2 of 4 series · 13 of 36 positions shown, 16 images · non-contrast
Comparison: 09/06/2017

CLINICAL DATA: Followup metastatic colon carcinoma, bilateral lung
adenocarcinoma and right breast carcinoma.

EXAM:
CT CHEST, ABDOMEN AND PELVIS WITHOUT CONTRAST
TECHNIQUE: Multidetector CT imaging of the chest, abdomen and pelvis was
performed following the standard protocol without IV contrast. No
intravenous contrast was administered due to IV contrast allergy.

[Series 2: cap w/o · axial · non-contrast · 0.98mm/px · z∈[-723,-178]mm · 10 of 131 slices shown, 13 images]
[im 11/131  mediastinal]
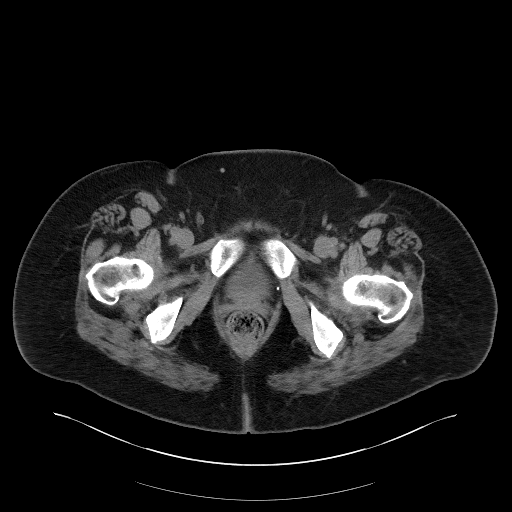
[im 11/131  lung]
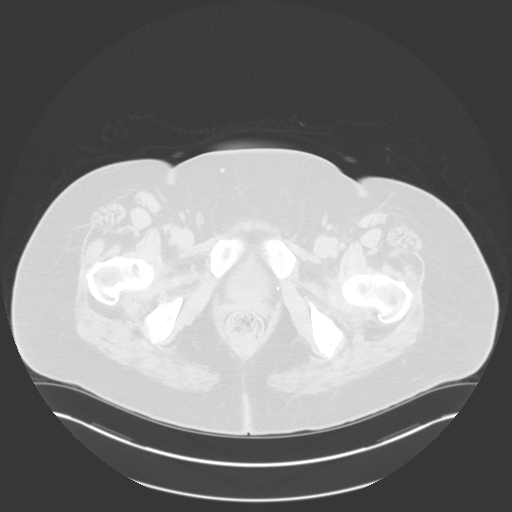
[im 22/131  lung]
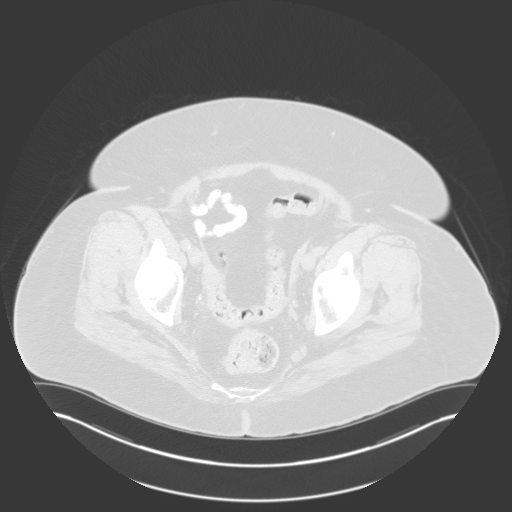
[im 33/131  lung]
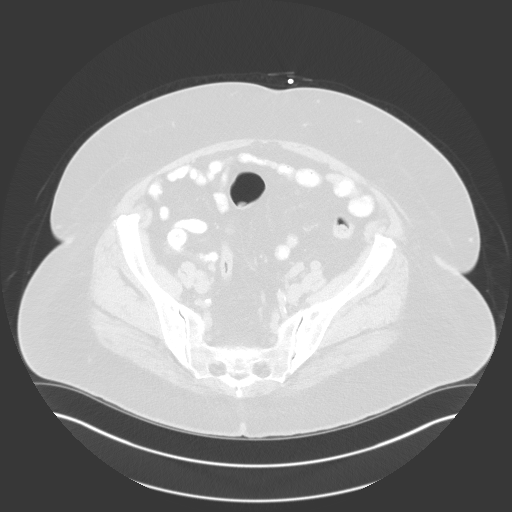
[im 44/131  lung]
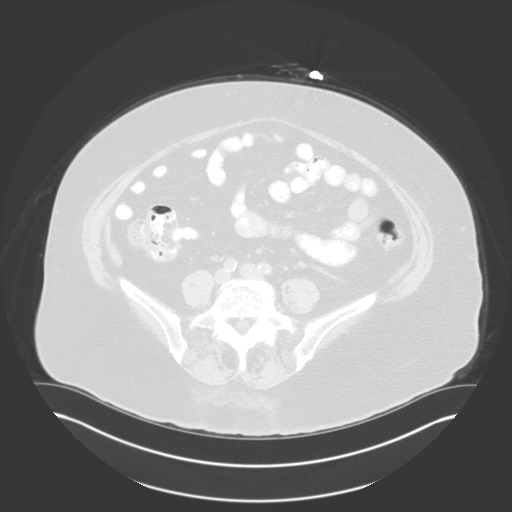
[im 55/131  mediastinal]
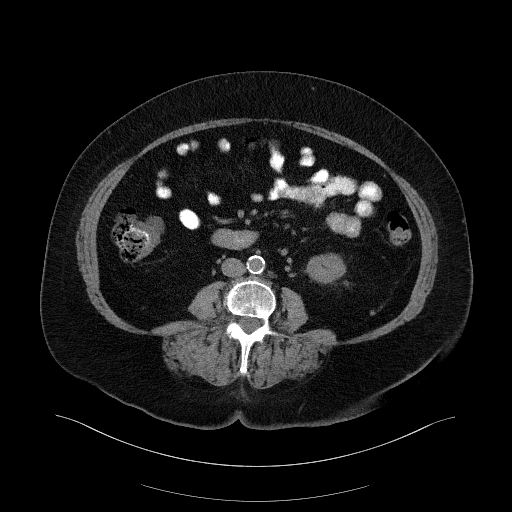
[im 55/131  lung]
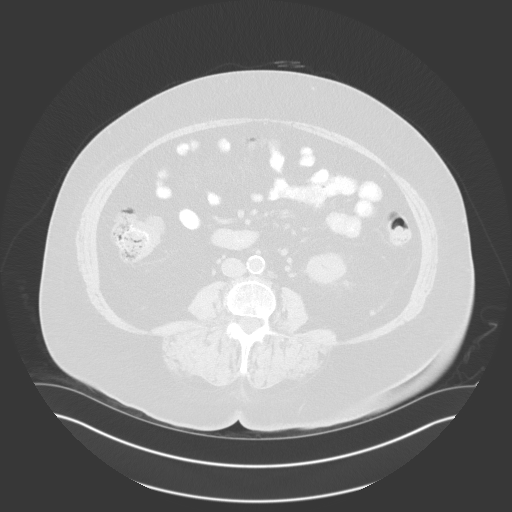
[im 76/131  lung]
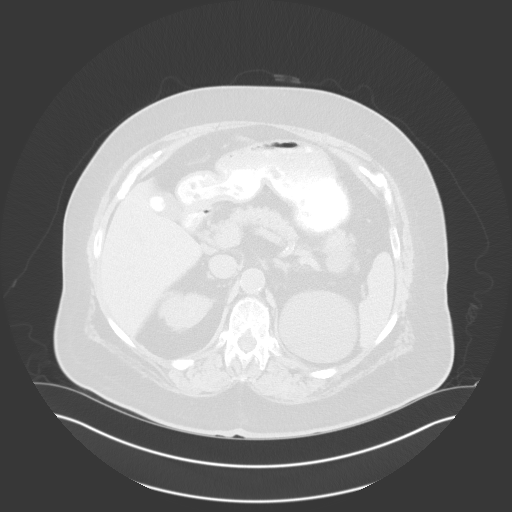
[im 87/131  lung]
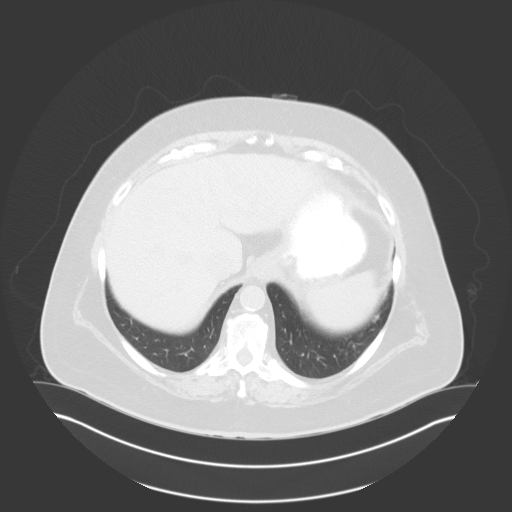
[im 98/131  lung]
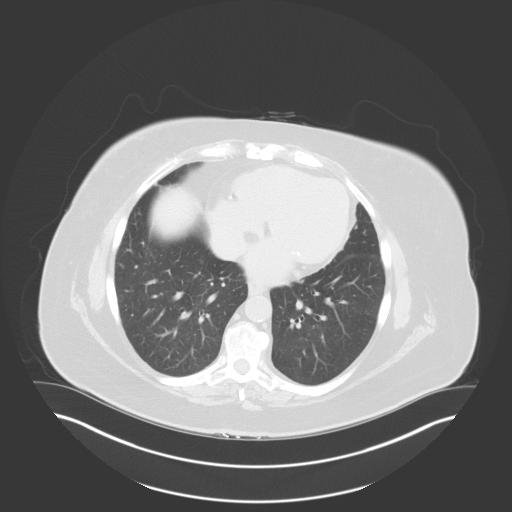
[im 109/131  mediastinal]
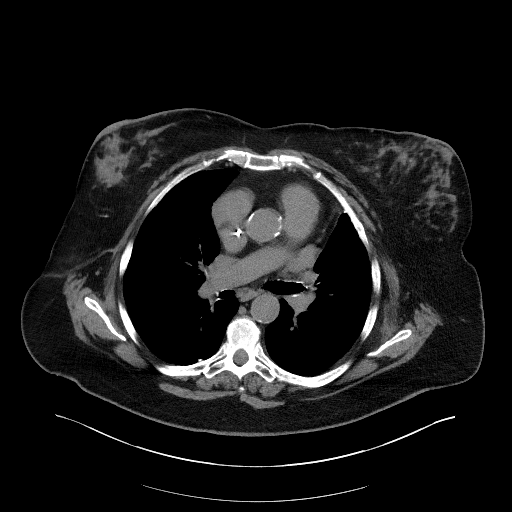
[im 109/131  lung]
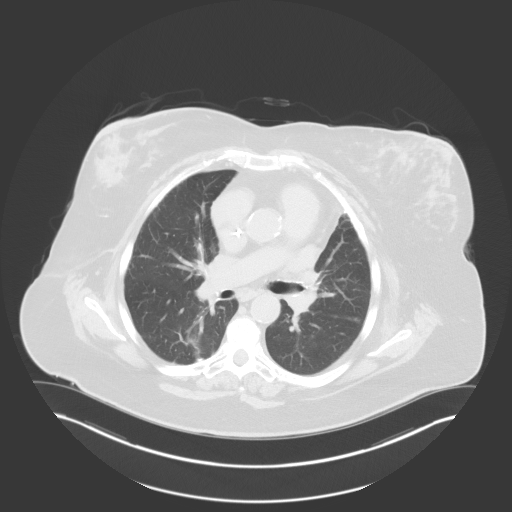
[im 120/131  lung]
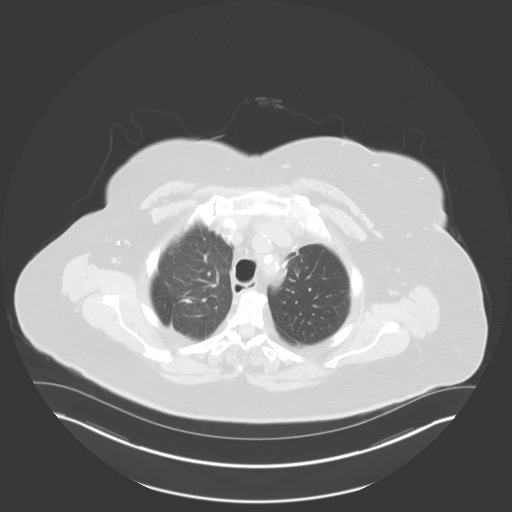

[Series 5: coronals · coronal · 0.82mm/px · 3 of 180 slices shown]
[im 36/180  lung]
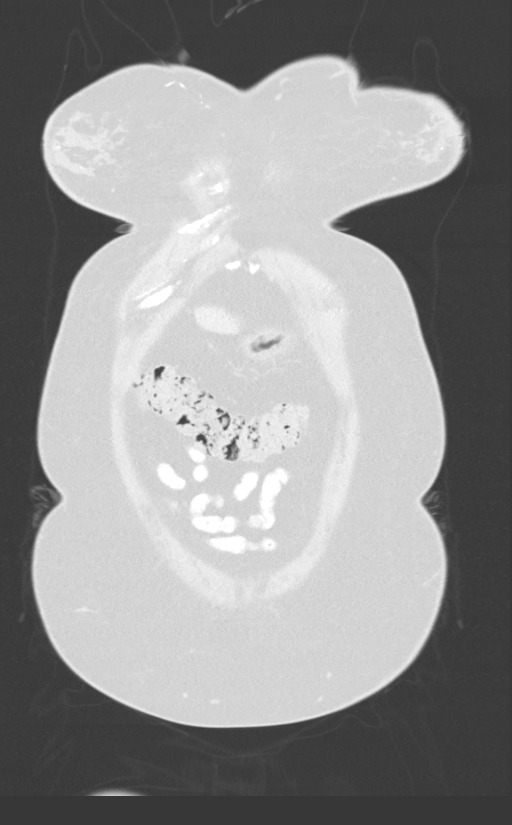
[im 72/180  lung]
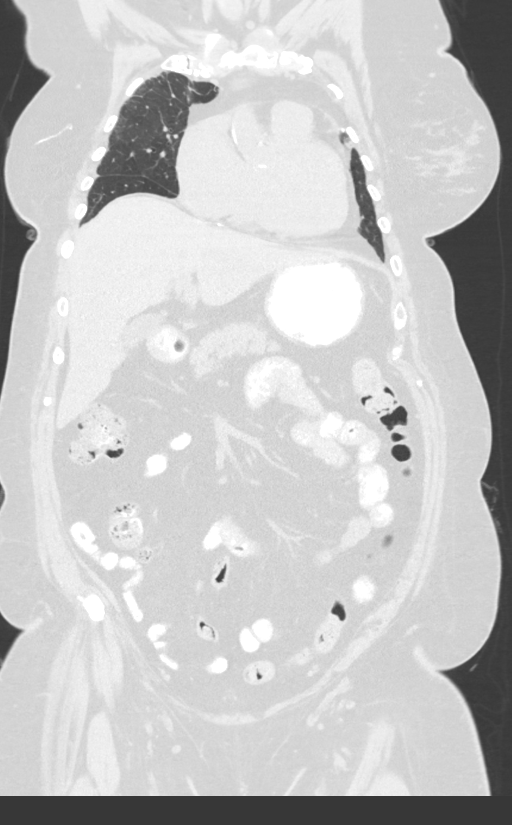
[im 108/180  lung]
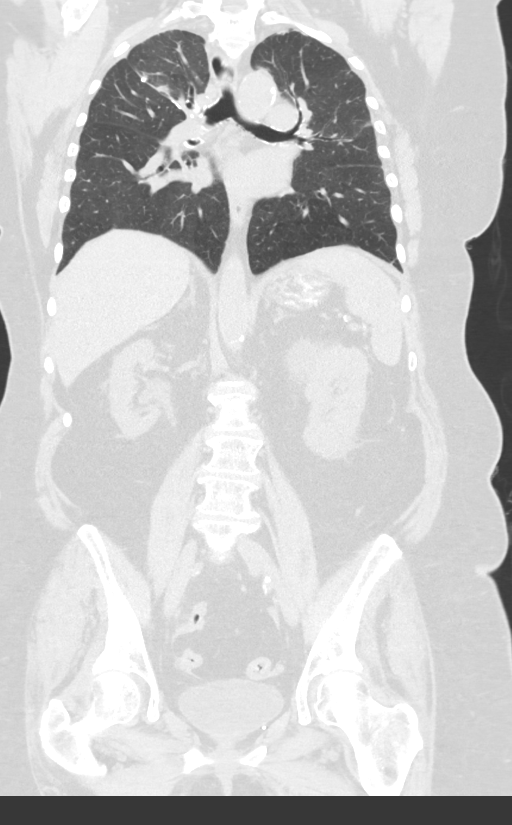

[13 of 36 positions shown; findings below may reference images not displayed]

FINDINGS: CT CHEST FINDINGS

Cardiovascular: No acute findings. Aortic and coronary artery
atherosclerosis.

Mediastinum/Lymph Nodes: No masses or pathologically enlarged lymph
nodes identified on this unenhanced exam. Postop changes from
previous right breast lumpectomy and axillary lymph node dissection,
without evidence of axillary lymphadenopathy.

Lungs/Pleura: Stable postop changes from previous wedge resections
in both upper lobes and right lower lobe. Patchy airspace opacity
previously seen in left lower lobe has nearly completely resolved
since previous study. 6 mm pulmonary nodule in anterior right lung
apex and 7 mm pulmonary nodule in posterior right lower lobe remains
stable. No new or enlarging pulmonary nodules or masses identified.
No evidence of acute infiltrate or pleural effusion.

Musculoskeletal:  No suspicious bone lesions identified.

CT ABDOMEN AND PELVIS FINDINGS

Hepatobiliary: 12 mm low-attenuation lesion in segment 4B remains
stable. No new or enlarging liver lesions identified on this
unenhanced exam.

Cholelithiasis again seen, without evidence of cholecystitis or
biliary dilatation.

Pancreas: No mass or inflammatory changes identified on this
unenhanced exam.

Spleen: Within normal limits in size.

Adrenals/Urinary Tract: Normal adrenal glands. Several cysts in the
upper and mid poles of the left kidney show no significant change,
largest measuring 8.4 cm. Tiny intrarenal calculi are again seen
bilaterally, largest in lower pole of right kidney measuring 4 mm.
No evidence of ureteral calculi or hydronephrosis. Unremarkable
unopacified urinary bladder.

Stomach/Bowel: 4 cm mural soft tissue density along the medial wall
of the ascending colon shows no significant change, consistent with
known colon carcinoma. No evidence of obstruction, inflammatory
process, or abnormal fluid collections. Normal appendix visualized.

Vascular/Lymphatic: Stable 14 mm lymph nodes seen in the porta
hepatis. No other pathologically enlarged lymph nodes identified. No
abdominal aortic aneurysm. Aortic atherosclerosis.

Reproductive: Prior hysterectomy noted. Adnexal regions are
unremarkable in appearance.

Other:  None.

Musculoskeletal:  No suspicious bone lesions identified.
IMPRESSION: Stable mural soft tissue mass involving the ascending colon,
consistent with known colon carcinoma.

Stable small left hepatic lobe metastasis in segment 4B.

Stable mild porta hepatis lymphadenopathy.

Stable sub-cm right lung nodules.

No new or progressive disease identified within the abdomen or
pelvis.

Other incidental findings described above.

## 2019-05-30 ENCOUNTER — Ambulatory Visit (INDEPENDENT_AMBULATORY_CARE_PROVIDER_SITE_OTHER): Payer: PPO | Admitting: Family

## 2019-05-30 ENCOUNTER — Ambulatory Visit: Payer: PPO | Admitting: Family

## 2019-05-30 ENCOUNTER — Other Ambulatory Visit: Payer: Self-pay | Admitting: Family

## 2019-05-30 ENCOUNTER — Encounter: Payer: Self-pay | Admitting: Family

## 2019-05-30 ENCOUNTER — Other Ambulatory Visit: Payer: Self-pay

## 2019-05-30 VITALS — BP 112/61 | HR 58 | Temp 98.0°F | Ht 71.0 in | Wt 281.0 lb

## 2019-05-30 DIAGNOSIS — J449 Chronic obstructive pulmonary disease, unspecified: Secondary | ICD-10-CM

## 2019-05-30 DIAGNOSIS — E785 Hyperlipidemia, unspecified: Secondary | ICD-10-CM

## 2019-05-30 DIAGNOSIS — C229 Malignant neoplasm of liver, not specified as primary or secondary: Secondary | ICD-10-CM | POA: Diagnosis not present

## 2019-05-30 DIAGNOSIS — M7989 Other specified soft tissue disorders: Secondary | ICD-10-CM | POA: Diagnosis not present

## 2019-05-30 DIAGNOSIS — I10 Essential (primary) hypertension: Secondary | ICD-10-CM

## 2019-05-30 DIAGNOSIS — I4891 Unspecified atrial fibrillation: Secondary | ICD-10-CM

## 2019-05-30 DIAGNOSIS — N898 Other specified noninflammatory disorders of vagina: Secondary | ICD-10-CM

## 2019-05-30 LAB — WET PREP FOR TRICH, YEAST, CLUE
Clue Cell Exam: NEGATIVE
Trichomonas Exam: NEGATIVE
Yeast Exam: NEGATIVE

## 2019-05-30 LAB — COAGUCHEK XS/INR WAIVED
INR: 1.5 — ABNORMAL HIGH (ref 0.9–1.1)
Prothrombin Time: 18.4 s

## 2019-05-30 MED ORDER — AMLODIPINE BESYLATE 5 MG PO TABS: 5.0000 mg | ORAL_TABLET | Freq: Every day | ORAL | 2 refills | Status: DC

## 2019-05-30 MED ORDER — WARFARIN SODIUM 2.5 MG PO TABS: ORAL_TABLET | ORAL | 1 refills | Status: DC

## 2019-05-30 MED ORDER — METOPROLOL TARTRATE 100 MG PO TABS: ORAL_TABLET | ORAL | 1 refills | Status: DC

## 2019-05-30 MED ORDER — SIMVASTATIN 10 MG PO TABS: 10.0000 mg | ORAL_TABLET | Freq: Every day | ORAL | 2 refills | Status: DC

## 2019-05-30 NOTE — Progress Notes (Signed)
Subjective:    Patient ID: Tammie [name], female    DOB: August 16, 1941, 78 y.o.   MRN: [id number]  Chief Complaint  Patient presents with  . protime recheck   Pt presents to the office today for chronic follow up and INR recheck.  She is followed by Oncologists every 3 months for hx of right breast cancer,  right lung cancer, and Colon cancer with mets to the liver.  Hypertension  This is a chronic problem. The current episode started more than 1 year ago. The problem has been resolved since onset. The problem is controlled. Associated symptoms include malaise/fatigue, peripheral edema and shortness of breath. Risk factors for coronary artery disease include dyslipidemia, obesity and sedentary lifestyle. The current treatment provides moderate improvement. Hypertensive end-organ damage includes CVA. There is no history of CAD/MI or heart failure.  Hyperlipidemia  Associated symptoms include shortness of breath.  Vaginal Itching  The patient's primary symptoms include genital itching. The patient's pertinent negatives include no vaginal bleeding or vaginal discharge. This is a new problem. The current episode started 1 to 4 weeks ago.  A FIb Pt taking warfarin. See anticoagulation  flow sheet.  COPD Uses albuterol inhaler as needed.    Review of Systems  Constitutional: Positive for malaise/fatigue.  Respiratory: Positive for shortness of breath.   Genitourinary: Negative for vaginal discharge.  All other systems reviewed and are negative.      Objective:   Physical Exam Vitals signs reviewed.  Constitutional:      General: She is not in acute distress.    Appearance: She is well-developed.  HENT:     Head: Normocephalic and atraumatic.     Right Ear: Tympanic membrane normal.     Left Ear: Tympanic membrane normal.  Eyes:     Pupils: Pupils are equal, round, and reactive to light.  Neck:     Musculoskeletal: Normal range of motion and neck supple.     Thyroid: No  thyromegaly.  Cardiovascular:     Rate and Rhythm: Normal rate and regular rhythm.     Heart sounds: Normal heart sounds. No murmur.  Pulmonary:     Effort: Pulmonary effort is normal. No respiratory distress.     Breath sounds: Normal breath sounds. Decreased air movement present. No wheezing.  Abdominal:     General: Bowel sounds are normal. There is no distension.     Palpations: Abdomen is soft.     Tenderness: There is no abdominal tenderness.  Musculoskeletal: Normal range of motion.        General: No tenderness.     Right lower leg: Edema (3+) present.     Left lower leg: Edema (3+) present.  Neurological:     Mental Status: She is alert and oriented to person, place, and time.     Cranial Nerves: No cranial nerve deficit.     Deep Tendon Reflexes: Reflexes are normal and symmetric.  Psychiatric:        Behavior: Behavior normal.        Thought Content: Thought content normal.        Judgment: Judgment normal.     BP 112/61   Pulse (!) 58   Temp 98 F (36.7 C) (Oral)   Ht 5\' 11"  (1.803 m)   Wt 281 lb (127.5 kg)   BMI 39.19 kg/m      Assessment & Plan:  Tammie [name] comes in today with chief complaint of protime recheck   Diagnosis and  orders addressed:  1. Essential hypertension - metoprolol tartrate (LOPRESSOR) 100 MG tablet; Take 1 and 1/2 tablets by mouth twice daily  Dispense: 270 tablet; Refill: 1 - amLODipine (NORVASC) 5 MG tablet; Take 1 tablet (5 mg total) by mouth daily.  Dispense: 90 tablet; Refill: 2  2. Leg swelling  3. 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 ([place])  4. Atrial fibrillation, unspecified type ([place]) Description   Today day 5 mg, then Start taking 2.5mg  ( 1 tablet)  Everyday.  INR today was 1.5 (goal is 2-3). Too thick      - warfarin (COUMADIN) 2.5 MG tablet; Take 1 tablet by mouth once daily,  Then take 1/2 tablet once daily on fridays  Dispense: 90 tablet; Refill: 1 - CoaguChek XS/INR Waived  5. Hyperlipidemia with  target LDL less than 100 - simvastatin (ZOCOR) 10 MG tablet; Take 1 tablet (10 mg total) by mouth daily.  Dispense: 90 tablet; Refill: 2  6. COPD with chronic bronchitis ([place])  7. Vagina itching - WET PREP FOR TRICH, YEAST, CLUE   Labs viewed and will have repeat labs with Oncologists  Health Maintenance reviewed Diet and exercise encouraged  Follow up plan: 2 weeks to recheck INR   [name], FNP

## 2019-05-30 NOTE — Patient Instructions (Signed)
Vaginal Yeast infection, Adult    Vaginal yeast infection is a condition that causes vaginal discharge as well as soreness, swelling, and redness (inflammation) of the vagina. This is a common condition. Some women get this infection frequently.  What are the causes?  This condition is caused by a change in the normal balance of the yeast (candida) and bacteria that live in the vagina. This change causes an overgrowth of yeast, which causes the inflammation.  What increases the risk?  The condition is more likely to develop in women who:   Take antibiotic medicines.   Have diabetes.   Take birth control pills.   Are pregnant.   Douche often.   Have a weak body defense system (immune system).   Have been taking steroid medicines for a long time.   Frequently wear tight clothing.  What are the signs or symptoms?  Symptoms of this condition include:   White, thick, creamy vaginal discharge.   Swelling, itching, redness, and irritation of the vagina. The lips of the vagina (vulva) may be affected as well.   Pain or a burning feeling while urinating.   Pain during sex.  How is this diagnosed?  This condition is diagnosed based on:   Your medical history.   A physical exam.   A pelvic exam. Your health care provider will examine a sample of your vaginal discharge under a microscope. Your health care provider may send this sample for testing to confirm the diagnosis.  How is this treated?  This condition is treated with medicine. Medicines may be over-the-counter or prescription. You may be told to use one or more of the following:   Medicine that is taken by mouth (orally).   Medicine that is applied as a cream (topically).   Medicine that is inserted directly into the vagina (suppository).  Follow these instructions at home:    Lifestyle   Do not have sex until your health care provider approves. Tell your sex partner that you have a yeast infection. That person should go to his or her health care  provider and ask if they should also be treated.   Do not wear tight clothes, such as pantyhose or tight pants.   Wear breathable cotton underwear.  General instructions   Take or apply over-the-counter and prescription medicines only as told by your health care provider.   Eat more yogurt. This may help to keep your yeast infection from returning.   Do not use tampons until your health care provider approves.   Try taking a sitz bath to help with discomfort. This is a warm water bath that is taken while you are sitting down. The water should only come up to your hips and should cover your buttocks. Do this 3-4 times per day or as told by your health care provider.   Do not douche.   If you have diabetes, keep your blood sugar levels under control.   Keep all follow-up visits as told by your health care provider. This is important.  Contact a health care provider if:   You have a fever.   Your symptoms go away and then return.   Your symptoms do not get better with treatment.   Your symptoms get worse.   You have new symptoms.   You develop blisters in or around your vagina.   You have blood coming from your vagina and it is not your menstrual period.   You develop pain in your abdomen.  Summary     Vaginal yeast infection is a condition that causes discharge as well as soreness, swelling, and redness (inflammation) of the vagina.   This condition is treated with medicine. Medicines may be over-the-counter or prescription.   Take or apply over-the-counter and prescription medicines only as told by your health care provider.   Do not douche. Do not have sex or use tampons until your health care provider approves.   Contact a health care provider if your symptoms do not get better with treatment or your symptoms go away and then return.  This information is not intended to replace advice given to you by your health care provider. Make sure you discuss any questions you have with your health care  provider.  Document Released: 09/22/2005 Document Revised: 05/01/2018 Document Reviewed: 05/01/2018  [employer]vier Interactive Patient Education  2019 [employer]

## 2019-06-14 ENCOUNTER — Other Ambulatory Visit: Payer: Self-pay

## 2019-06-15 ENCOUNTER — Encounter: Payer: Self-pay | Admitting: Family

## 2019-06-15 ENCOUNTER — Other Ambulatory Visit: Payer: Self-pay

## 2019-06-15 ENCOUNTER — Ambulatory Visit (INDEPENDENT_AMBULATORY_CARE_PROVIDER_SITE_OTHER): Payer: PPO | Admitting: Family

## 2019-06-15 VITALS — BP 123/60 | HR 70 | Temp 97.4°F | Ht 71.0 in | Wt 281.0 lb

## 2019-06-15 DIAGNOSIS — I4891 Unspecified atrial fibrillation: Secondary | ICD-10-CM

## 2019-06-15 DIAGNOSIS — Z7901 Long term (current) use of anticoagulants: Secondary | ICD-10-CM

## 2019-06-15 LAB — COAGUCHEK XS/INR WAIVED
INR: 2.1 — ABNORMAL HIGH (ref 0.9–1.1)
Prothrombin Time: 25.4 s

## 2019-06-15 NOTE — Progress Notes (Signed)
   Subjective:    Patient ID: Tammie [name], female    DOB: 1941-12-11, 78 y.o.   MRN: [id number]  Chief Complaint  Patient presents with  . recheck protime    HPI Pt presents to the office today to recheck INR. She was seen on 05/30/19 and her INR was 1.5. We increased her warfarin to 2.5 mg every day. Her INR today is normal.   She is currently taking warfarin for A fib. Stable. See anticoagulation flow sheet.    Review of Systems  Respiratory: Positive for shortness of breath.   Cardiovascular: Positive for leg swelling.  All other systems reviewed and are negative.      Objective:   Physical Exam Vitals signs reviewed.  Constitutional:      General: She is not in acute distress.    Appearance: She is well-developed.  HENT:     Head: Normocephalic and atraumatic.  Eyes:     Pupils: Pupils are equal, round, and reactive to light.  Neck:     Musculoskeletal: Normal range of motion and neck supple.     Thyroid: No thyromegaly.  Cardiovascular:     Rate and Rhythm: Normal rate and regular rhythm.     Heart sounds: Normal heart sounds. No murmur.  Pulmonary:     Effort: Pulmonary effort is normal. No respiratory distress.     Breath sounds: Decreased breath sounds present. No wheezing.  Abdominal:     General: Bowel sounds are normal. There is no distension.     Palpations: Abdomen is soft.     Tenderness: There is no abdominal tenderness.  Musculoskeletal:        General: No tenderness.     Comments: Bilateral lymphedema present  Skin:    General: Skin is warm and dry.  Neurological:     Mental Status: She is alert and oriented to person, place, and time.     Cranial Nerves: No cranial nerve deficit.     Deep Tendon Reflexes: Reflexes are normal and symmetric.  Psychiatric:        Behavior: Behavior normal.        Thought Content: Thought content normal.        Judgment: Judgment normal.       BP 123/60   Pulse 70   Temp (!) 97.4 F (36.3 C) (Oral)    Ht 5\' 11"  (1.803 m)   Wt 281 lb (127.5 kg)   BMI 39.19 kg/m      Assessment & Plan:  ANGELINE TRICK comes in today with chief complaint of recheck protime   Diagnosis and orders addressed: 1. Atrial fibrillation, unspecified type [facility]) Description   Continue  taking 2.5mg  ( 1 tablet)  Everyday.  INR today was 2.1 (goal is 2-3). At goal!!     Current dose of warfarin  RTO in 4 weeks   2. Chronic anticoagulation   [name], FNP

## 2019-06-15 NOTE — Addendum Note (Signed)
Addended by: [name] A on: 06/15/2019 11:50 AM   Modules accepted: Orders

## 2019-06-15 NOTE — Patient Instructions (Signed)
Description   Continue  taking 2.5mg  ( 1 tablet)  Everyday.  INR today was 2.1 (goal is 2-3). At goal!!

## 2019-06-15 NOTE — Addendum Note (Signed)
Addended by: Earlene Plater on: 06/15/2019 01:33 PM   Modules accepted: Orders

## 2019-06-28 ENCOUNTER — Other Ambulatory Visit: Payer: Self-pay

## 2019-06-28 ENCOUNTER — Inpatient Hospital Stay: Payer: PPO | Attending: Internal Medicine

## 2019-06-28 ENCOUNTER — Ambulatory Visit (HOSPITAL_COMMUNITY)
Admission: RE | Admit: 2019-06-28 | Discharge: 2019-06-28 | Disposition: A | Discharge status: Home / Self Care | Payer: PPO | Source: Ambulatory Visit | Attending: Internal Medicine | Admitting: Internal Medicine

## 2019-06-28 DIAGNOSIS — I10 Essential (primary) hypertension: Secondary | ICD-10-CM | POA: Diagnosis not present

## 2019-06-28 DIAGNOSIS — C3431 Malignant neoplasm of lower lobe, right bronchus or lung: Secondary | ICD-10-CM | POA: Insufficient documentation

## 2019-06-28 DIAGNOSIS — Z7901 Long term (current) use of anticoagulants: Secondary | ICD-10-CM | POA: Insufficient documentation

## 2019-06-28 DIAGNOSIS — Z881 Allergy status to other antibiotic agents status: Secondary | ICD-10-CM | POA: Insufficient documentation

## 2019-06-28 DIAGNOSIS — Z87442 Personal history of urinary calculi: Secondary | ICD-10-CM | POA: Diagnosis not present

## 2019-06-28 DIAGNOSIS — J449 Chronic obstructive pulmonary disease, unspecified: Secondary | ICD-10-CM | POA: Insufficient documentation

## 2019-06-28 DIAGNOSIS — Z888 Allergy status to other drugs, medicaments and biological substances status: Secondary | ICD-10-CM | POA: Diagnosis not present

## 2019-06-28 DIAGNOSIS — C3490 Malignant neoplasm of unspecified part of unspecified bronchus or lung: Secondary | ICD-10-CM

## 2019-06-28 DIAGNOSIS — Z79899 Other long term (current) drug therapy: Secondary | ICD-10-CM | POA: Insufficient documentation

## 2019-06-28 DIAGNOSIS — N200 Calculus of kidney: Secondary | ICD-10-CM | POA: Diagnosis not present

## 2019-06-28 DIAGNOSIS — R5383 Other fatigue: Secondary | ICD-10-CM | POA: Insufficient documentation

## 2019-06-28 DIAGNOSIS — R05 Cough: Secondary | ICD-10-CM | POA: Insufficient documentation

## 2019-06-28 DIAGNOSIS — C182 Malignant neoplasm of ascending colon: Secondary | ICD-10-CM | POA: Insufficient documentation

## 2019-06-28 DIAGNOSIS — Z171 Estrogen receptor negative status [ER-]: Secondary | ICD-10-CM | POA: Diagnosis not present

## 2019-06-28 DIAGNOSIS — C787 Secondary malignant neoplasm of liver and intrahepatic bile duct: Secondary | ICD-10-CM | POA: Insufficient documentation

## 2019-06-28 DIAGNOSIS — J45909 Unspecified asthma, uncomplicated: Secondary | ICD-10-CM | POA: Insufficient documentation

## 2019-06-28 DIAGNOSIS — I700 Atherosclerosis of aorta: Secondary | ICD-10-CM | POA: Diagnosis not present

## 2019-06-28 DIAGNOSIS — Z85118 Personal history of other malignant neoplasm of bronchus and lung: Secondary | ICD-10-CM | POA: Insufficient documentation

## 2019-06-28 DIAGNOSIS — N281 Cyst of kidney, acquired: Secondary | ICD-10-CM | POA: Diagnosis not present

## 2019-06-28 DIAGNOSIS — E785 Hyperlipidemia, unspecified: Secondary | ICD-10-CM | POA: Insufficient documentation

## 2019-06-28 DIAGNOSIS — Z882 Allergy status to sulfonamides status: Secondary | ICD-10-CM | POA: Diagnosis not present

## 2019-06-28 DIAGNOSIS — I4891 Unspecified atrial fibrillation: Secondary | ICD-10-CM | POA: Diagnosis not present

## 2019-06-28 DIAGNOSIS — C3411 Malignant neoplasm of upper lobe, right bronchus or lung: Secondary | ICD-10-CM | POA: Diagnosis not present

## 2019-06-28 DIAGNOSIS — K5900 Constipation, unspecified: Secondary | ICD-10-CM | POA: Insufficient documentation

## 2019-06-28 DIAGNOSIS — Z853 Personal history of malignant neoplasm of breast: Secondary | ICD-10-CM | POA: Insufficient documentation

## 2019-06-28 DIAGNOSIS — R0602 Shortness of breath: Secondary | ICD-10-CM | POA: Insufficient documentation

## 2019-06-28 DIAGNOSIS — C785 Secondary malignant neoplasm of large intestine and rectum: Secondary | ICD-10-CM | POA: Diagnosis not present

## 2019-06-28 LAB — CMP (CANCER CENTER ONLY)
ALT: 13 U/L (ref 0–44)
AST: 16 U/L (ref 15–41)
Albumin: 3.7 g/dL (ref 3.5–5.0)
Alkaline Phosphatase: 46 U/L (ref 38–126)
Anion gap: 11 (ref 5–15)
BUN: 24 mg/dL — ABNORMAL HIGH (ref 8–23)
CO2: 26 mmol/L (ref 22–32)
Calcium: 8.9 mg/dL (ref 8.9–10.3)
Chloride: 107 mmol/L (ref 98–111)
Creatinine: 1.49 mg/dL — ABNORMAL HIGH (ref 0.44–1.00)
GFR, Est AFR Am: 39 mL/min — ABNORMAL LOW (ref >60)
GFR, Estimated: 34 mL/min — ABNORMAL LOW (ref >60)
Glucose, Bld: 90 mg/dL (ref 70–99)
Potassium: 4.7 mmol/L (ref 3.5–5.1)
Sodium: 144 mmol/L (ref 135–145)
Total Bilirubin: 0.5 mg/dL (ref 0.3–1.2)
Total Protein: 7.1 g/dL (ref 6.5–8.1)

## 2019-06-28 LAB — CBC WITH DIFFERENTIAL (CANCER CENTER ONLY)
Abs Immature Granulocytes: 0.01 10*3/uL (ref 0.00–0.07)
Basophils Absolute: 0 10*3/uL (ref 0.0–0.1)
Basophils Relative: 1 %
Eosinophils Absolute: 0.3 10*3/uL (ref 0.0–0.5)
Eosinophils Relative: 5 %
HCT: 47.8 % — ABNORMAL HIGH (ref 36.0–46.0)
Hemoglobin: 14.7 g/dL (ref 12.0–15.0)
Immature Granulocytes: 0 %
Lymphocytes Relative: 18 %
Lymphs Abs: 1 10*3/uL (ref 0.7–4.0)
MCH: 27.8 pg (ref 26.0–34.0)
MCHC: 30.8 g/dL (ref 30.0–36.0)
MCV: 90.5 fL (ref 80.0–100.0)
Monocytes Absolute: 0.5 10*3/uL (ref 0.1–1.0)
Monocytes Relative: 9 %
Neutro Abs: 3.6 10*3/uL (ref 1.7–7.7)
Neutrophils Relative %: 67 %
Platelet Count: 144 10*3/uL — ABNORMAL LOW (ref 150–400)
RBC: 5.28 MIL/uL — ABNORMAL HIGH (ref 3.87–5.11)
RDW: 13.8 % (ref 11.5–15.5)
WBC Count: 5.4 10*3/uL (ref 4.0–10.5)
nRBC: 0 % (ref 0.0–0.2)

## 2019-06-28 LAB — CEA (IN HOUSE-CHCC): CEA (CHCC-In House): 1.84 ng/mL (ref 0.00–5.00)

## 2019-07-02 ENCOUNTER — Encounter: Payer: Self-pay | Admitting: Physician Assistant

## 2019-07-02 ENCOUNTER — Telehealth: Payer: Self-pay | Admitting: Physician Assistant

## 2019-07-02 ENCOUNTER — Other Ambulatory Visit: Payer: Self-pay

## 2019-07-02 ENCOUNTER — Inpatient Hospital Stay (HOSPITAL_BASED_OUTPATIENT_CLINIC_OR_DEPARTMENT_OTHER): Payer: PPO | Admitting: Physician Assistant

## 2019-07-02 VITALS — BP 157/66 | HR 68 | Temp 98.0°F | Resp 18 | Ht 71.0 in | Wt 282.4 lb

## 2019-07-02 DIAGNOSIS — N200 Calculus of kidney: Secondary | ICD-10-CM | POA: Diagnosis not present

## 2019-07-02 DIAGNOSIS — C787 Secondary malignant neoplasm of liver and intrahepatic bile duct: Secondary | ICD-10-CM

## 2019-07-02 DIAGNOSIS — I10 Essential (primary) hypertension: Secondary | ICD-10-CM

## 2019-07-02 DIAGNOSIS — E785 Hyperlipidemia, unspecified: Secondary | ICD-10-CM

## 2019-07-02 DIAGNOSIS — I4891 Unspecified atrial fibrillation: Secondary | ICD-10-CM

## 2019-07-02 DIAGNOSIS — R5383 Other fatigue: Secondary | ICD-10-CM | POA: Diagnosis not present

## 2019-07-02 DIAGNOSIS — Z79899 Other long term (current) drug therapy: Secondary | ICD-10-CM

## 2019-07-02 DIAGNOSIS — Z882 Allergy status to sulfonamides status: Secondary | ICD-10-CM

## 2019-07-02 DIAGNOSIS — Z888 Allergy status to other drugs, medicaments and biological substances status: Secondary | ICD-10-CM

## 2019-07-02 DIAGNOSIS — Z171 Estrogen receptor negative status [ER-]: Secondary | ICD-10-CM

## 2019-07-02 DIAGNOSIS — I700 Atherosclerosis of aorta: Secondary | ICD-10-CM

## 2019-07-02 DIAGNOSIS — Z881 Allergy status to other antibiotic agents status: Secondary | ICD-10-CM

## 2019-07-02 DIAGNOSIS — R0602 Shortness of breath: Secondary | ICD-10-CM | POA: Diagnosis not present

## 2019-07-02 DIAGNOSIS — Z87442 Personal history of urinary calculi: Secondary | ICD-10-CM

## 2019-07-02 DIAGNOSIS — C182 Malignant neoplasm of ascending colon: Secondary | ICD-10-CM | POA: Diagnosis not present

## 2019-07-02 DIAGNOSIS — K5900 Constipation, unspecified: Secondary | ICD-10-CM | POA: Diagnosis not present

## 2019-07-02 DIAGNOSIS — C189 Malignant neoplasm of colon, unspecified: Secondary | ICD-10-CM

## 2019-07-02 DIAGNOSIS — R05 Cough: Secondary | ICD-10-CM | POA: Diagnosis not present

## 2019-07-02 DIAGNOSIS — J45909 Unspecified asthma, uncomplicated: Secondary | ICD-10-CM

## 2019-07-02 DIAGNOSIS — Z853 Personal history of malignant neoplasm of breast: Secondary | ICD-10-CM

## 2019-07-02 DIAGNOSIS — C3431 Malignant neoplasm of lower lobe, right bronchus or lung: Secondary | ICD-10-CM

## 2019-07-02 DIAGNOSIS — Z7901 Long term (current) use of anticoagulants: Secondary | ICD-10-CM

## 2019-07-02 DIAGNOSIS — J449 Chronic obstructive pulmonary disease, unspecified: Secondary | ICD-10-CM

## 2019-07-02 DIAGNOSIS — Z85118 Personal history of other malignant neoplasm of bronchus and lung: Secondary | ICD-10-CM

## 2019-07-02 DIAGNOSIS — N281 Cyst of kidney, acquired: Secondary | ICD-10-CM

## 2019-07-02 NOTE — Progress Notes (Signed)
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 personally and independently reviewed the scans and discussed the results with the Tammie [name] today. Her scan showed no concerning findings for disease progression. I recommended for the Tammie [name] to continue on observation with repeat CT scan of the chest, abdomen and pelvis in 3 months. She was advised to call immediately if she has any concerning symptoms in the interval.  Disclaimer: This note was dictated with voice recognition software. Similar sounding words can inadvertently be transcribed and may be missed upon review. Eilleen Kempf, MD 07/02/19

## 2019-07-02 NOTE — Telephone Encounter (Signed)
Gave avs and calendar. Gave contrast

## 2019-07-23 ENCOUNTER — Encounter: Payer: Self-pay | Admitting: Family

## 2019-07-23 ENCOUNTER — Ambulatory Visit (INDEPENDENT_AMBULATORY_CARE_PROVIDER_SITE_OTHER): Payer: PPO | Admitting: Family

## 2019-07-23 DIAGNOSIS — E785 Hyperlipidemia, unspecified: Secondary | ICD-10-CM | POA: Diagnosis not present

## 2019-07-23 DIAGNOSIS — J45909 Unspecified asthma, uncomplicated: Secondary | ICD-10-CM

## 2019-07-23 DIAGNOSIS — C50411 Malignant neoplasm of upper-outer quadrant of right female breast: Secondary | ICD-10-CM

## 2019-07-23 DIAGNOSIS — G629 Polyneuropathy, unspecified: Secondary | ICD-10-CM | POA: Diagnosis not present

## 2019-07-23 DIAGNOSIS — I4891 Unspecified atrial fibrillation: Secondary | ICD-10-CM

## 2019-07-23 DIAGNOSIS — C3431 Malignant neoplasm of lower lobe, right bronchus or lung: Secondary | ICD-10-CM

## 2019-07-23 DIAGNOSIS — I10 Essential (primary) hypertension: Secondary | ICD-10-CM

## 2019-07-23 NOTE — Progress Notes (Signed)
Virtual Visit via telephone Note  I connected with Tammie [name] on 07/23/19 at 10:09 AM by telephone and verified that I am speaking with the correct person using two identifiers. Tammie [name] is currently located at [facility] and no one is currently with her during visit. The provider, [name], FNP is located in their office at time of visit.  I discussed the limitations, risks, security and privacy concerns of performing an evaluation and management service by telephone and the availability of in person appointments. I also discussed with the patient that there may be a patient responsible charge related to this service. The patient expressed understanding and agreed to proceed.   History and Present Illness:  Pt presents to the office today for chronic follow up and INR recheck. She is followed by Oncologists every 3 months for hx of right breast cancer, right lung cancer, and Colon cancer with mets to the liver.  Hypertension This is a chronic problem. The current episode started more than 1 year ago. The problem has been waxing and waning (150's/80's) since onset. The problem is controlled. Associated symptoms include malaise/fatigue, peripheral edema and shortness of breath. Pertinent negatives include no headaches. Risk factors for coronary artery disease include dyslipidemia, obesity and sedentary lifestyle. The current treatment provides moderate improvement.  Asthma She complains of shortness of breath and wheezing. There is no cough. This is a chronic problem. The current episode started more than 1 year ago. The problem has been waxing and waning. Associated symptoms include malaise/fatigue. Pertinent negatives include no headaches. Her symptoms are aggravated by pollen. She reports significant improvement on treatment. Her past medical history is significant for asthma.  Hyperlipidemia This is a chronic problem. The current episode started more than 1 year ago. The problem is  controlled. Recent lipid tests were reviewed and are normal. Exacerbating diseases include obesity. Associated symptoms include shortness of breath. Current antihyperlipidemic treatment includes statins. The current treatment provides moderate improvement of lipids. Risk factors for coronary artery disease include dyslipidemia, hypertension, post-menopausal and a sedentary lifestyle.  Neuropathy  Pt reports bilateral numbness.  A Fib Pt currently taking warfarin. Last INR was at goal on 06/15/19.  Review of Systems  Constitutional: Positive for malaise/fatigue.  Respiratory: Positive for shortness of breath and wheezing. Negative for cough.   Neurological: Negative for headaches.  All other systems reviewed and are negative.    Observations/Objective: No SOB or distress noted   Assessment and Plan: Tammie [name] comes in today with chief complaint of No chief complaint on file.   Diagnosis and orders addressed:  1. Essential hypertensio  2. Atrial fibrillation, unspecified type ([place]  3. 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 ([place])  4. Uncomplicated asthma, unspecified asthma severity, unspecified whether persistent  5. Neuropathy  6. Hyperlipidemia with target LDL less than 100  7. Malignant neoplasm of upper-outer quadrant of right female breast, unspecified estrogen receptor status ([place])   Labs reviewed from Oncologists Health Maintenance reviewed Diet and exercise encouraged  Follow up plan: 3 months and keep all follow up appts with Oncologists      I discussed the assessment and treatment plan with the patient. The patient was provided an opportunity to ask questions and all were answered. The patient agreed with the plan and demonstrated an understanding of the instructions.   The patient was advised to call back or seek an in-person evaluation if the symptoms worsen or if the condition fails to improve as anticipated.  The above assessment and  management plan was discussed with the patient. The patient verbalized understanding of and has agreed to the management plan. Patient is aware to call the clinic if symptoms persist or worsen. Patient is aware when to return to the clinic for a follow-up visit. Patient educated on when it is appropriate to go to the emergency department.   Time call ended:  10:26 AM  I provided 17 minutes of non-face-to-face time during this encounter.    [name], FNP '

## 2019-07-29 IMAGING — CT CT ABD-PELV W/O CM
2 of 4 series · 14 of 46 positions shown, 16 images · non-contrast
Comparison: 11/03/2017

CLINICAL DATA: hx of lt. Lung ca dx'd 5225, rt.breast ca dx'd 5102,
rt. Lung ca dx'd 5530, colon ca dx'd 3739, keytruda in progress, xrt
complete, bilateral lung resection, rt. Lumpectomy. chest pain,
cough and shortness of breath and diarrhea

EXAM:
CT CHEST, ABDOMEN AND PELVIS WITHOUT CONTRAST
TECHNIQUE: Multidetector CT imaging of the chest, abdomen and pelvis was
performed following the standard protocol without IV contrast.

[Series 2: cap w/o · axial · non-contrast · 0.81mm/px · z∈[-634,-64]mm · 11 of 134 slices shown, 13 images]
[im 10/134  soft-tissue]
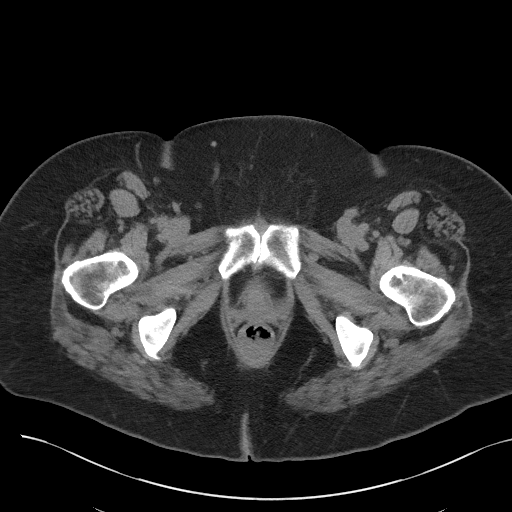
[im 10/134  bone]
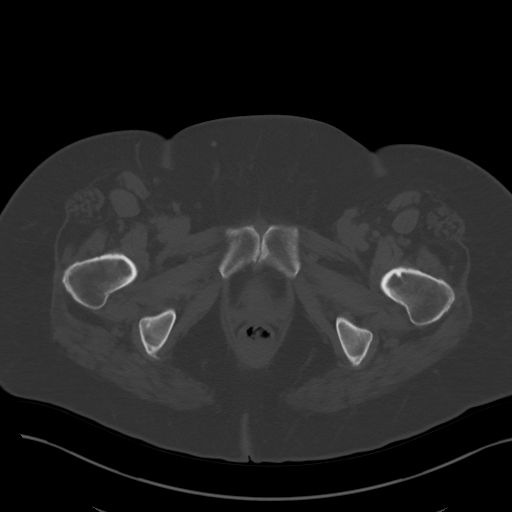
[im 20/134  soft-tissue]
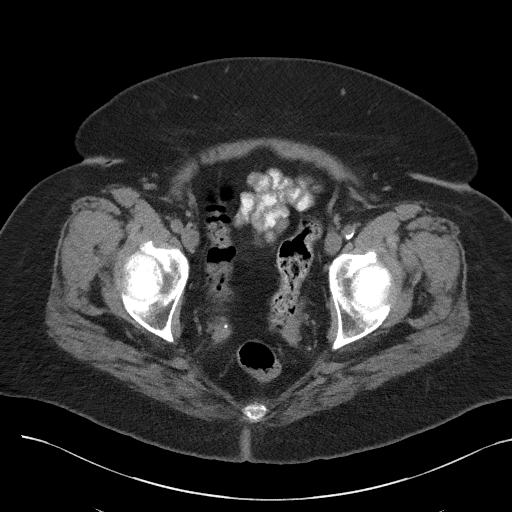
[im 29/134  soft-tissue]
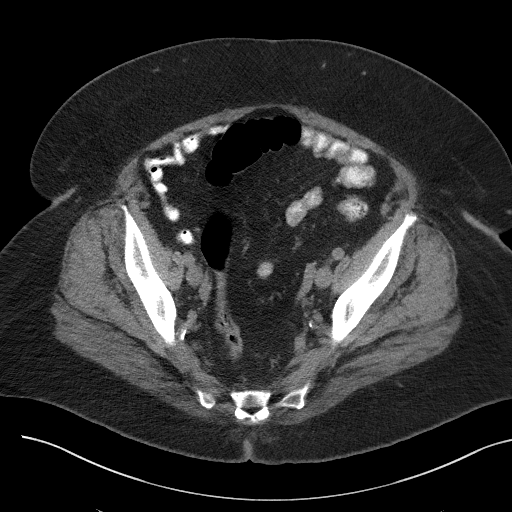
[im 48/134  soft-tissue]
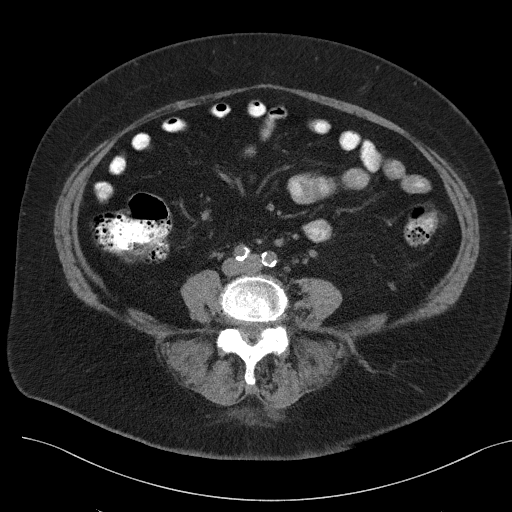
[im 58/134  soft-tissue]
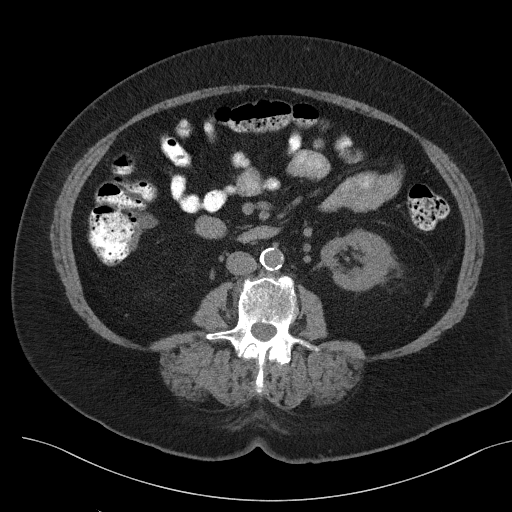
[im 67/134  soft-tissue]
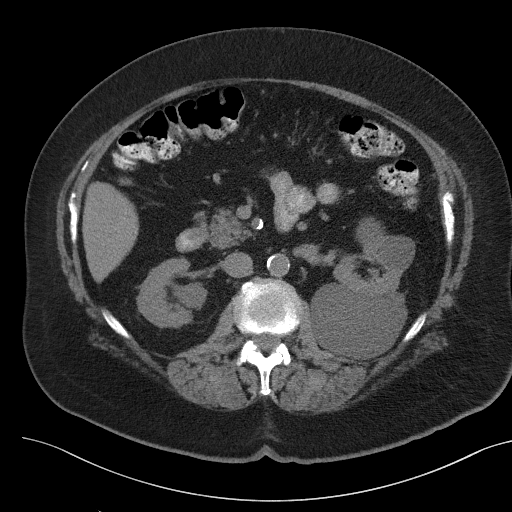
[im 77/134  soft-tissue]
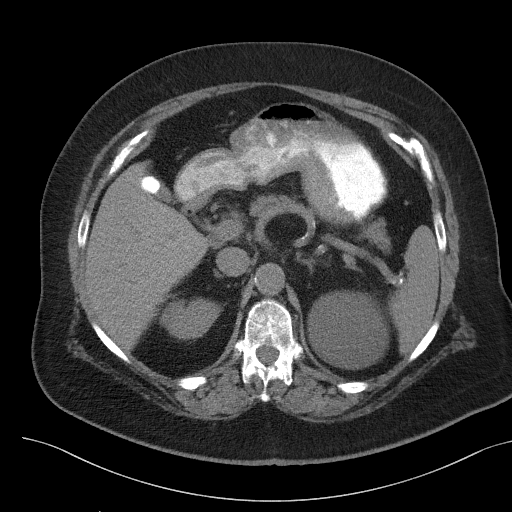
[im 86/134  soft-tissue]
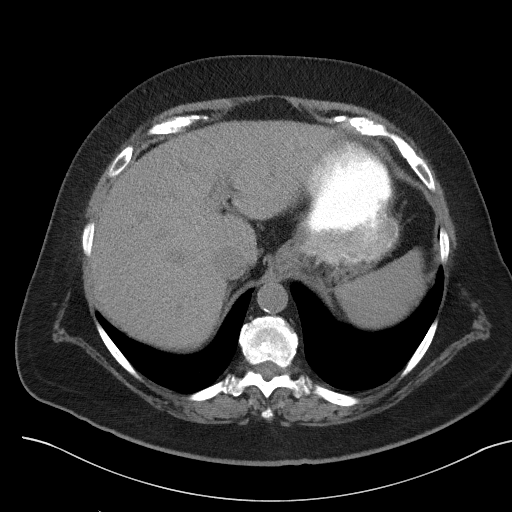
[im 105/134  soft-tissue]
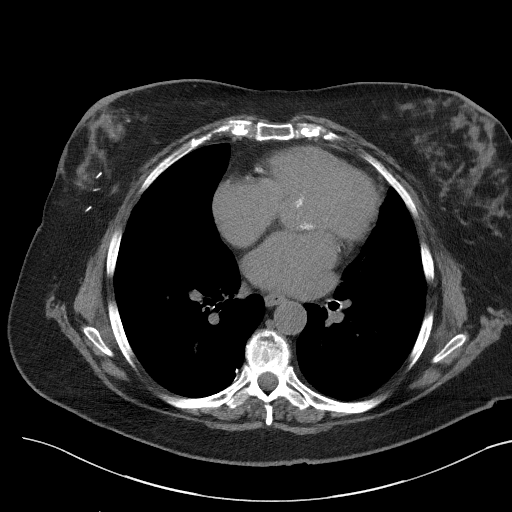
[im 105/134  bone]
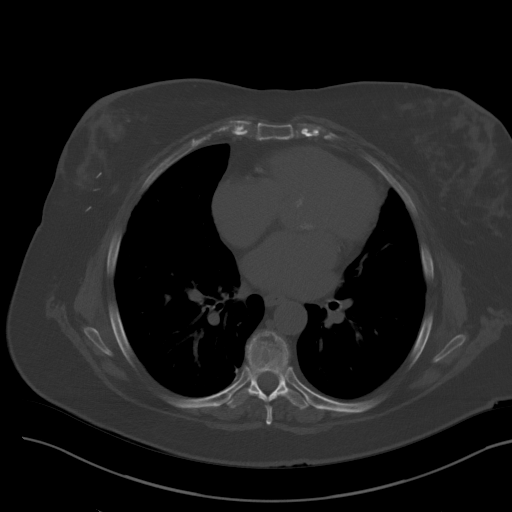
[im 115/134  soft-tissue]
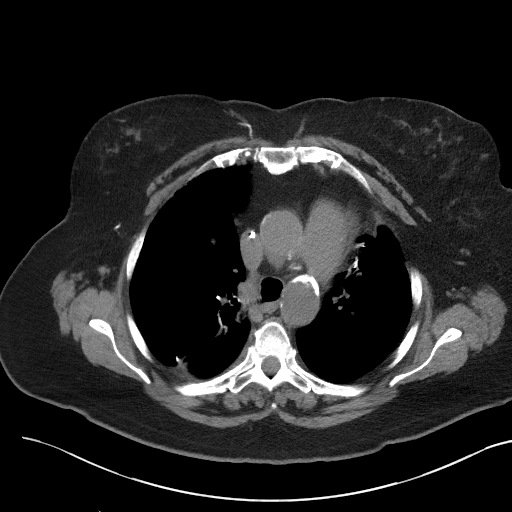
[im 124/134  soft-tissue]
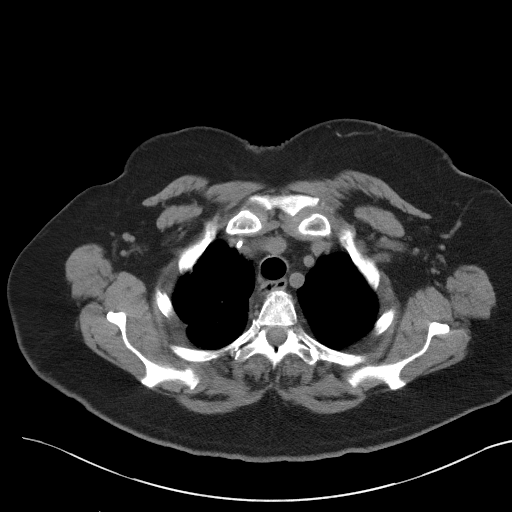

[Series 5: coronals · coronal · 0.88mm/px · 3 of 166 slices shown]
[im 56/166  soft-tissue]
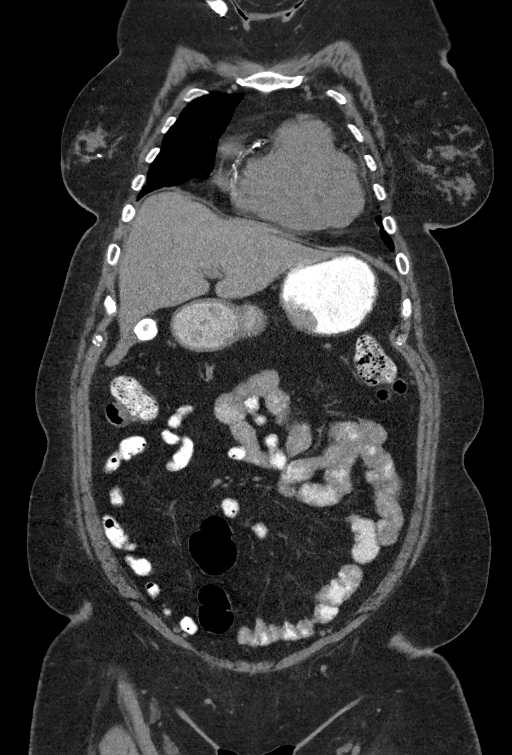
[im 74/166  soft-tissue]
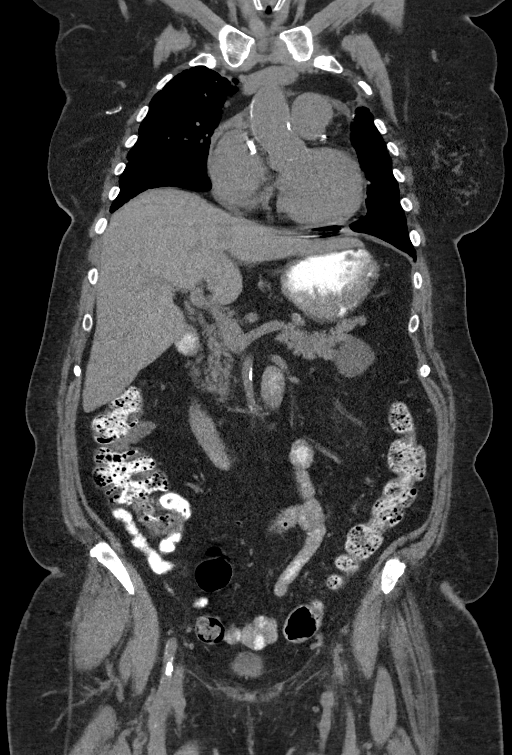
[im 92/166  soft-tissue]
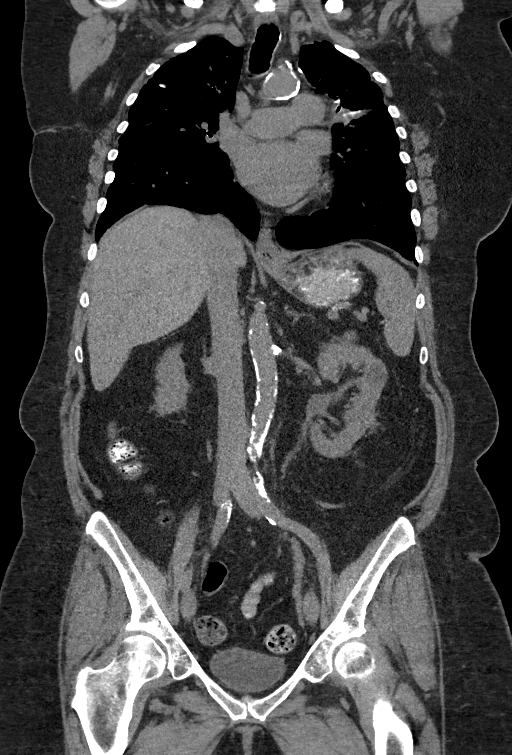

[14 of 46 positions shown; findings below may reference images not displayed]

FINDINGS: CT CHEST FINDINGS

Cardiovascular: The heart size is normal. No pericardial effusion.
Coronary artery calcification is evident. Atherosclerotic
calcification is noted in the wall of the thoracic aorta. Right
Port-A-Cath tip positioned in the mid right atrium.

Mediastinum/Nodes: No mediastinal lymphadenopathy. No evidence for
gross hilar lymphadenopathy although assessment is limited by the
lack of intravenous contrast on today's study. The esophagus has
normal imaging features. There is no axillary lymphadenopathy.

Lungs/Pleura: Biapical pleural-parenchymal scarring is similar to
prior. The 6 mm apical nodule measured in the right lung on the
previous study is stable at 6 mm. Surgical scarring in the right
upper lung is unchanged. 7 mm posterior right lower lobe nodule
(image 97 series 4) is unchanged. Left parahilar surgical scarring
is similar to prior.

Musculoskeletal: Bone windows reveal no worrisome lytic or sclerotic
osseous lesions.

CT ABDOMEN PELVIS FINDINGS

Hepatobiliary: 11 mm hypoattenuating lesion in the liver (segment
IVB) is unchanged. 2.4 cm calcified gallstone evident. No
intrahepatic or extrahepatic biliary dilation.

Pancreas: No focal mass lesion. No dilatation of the main duct. No
intraparenchymal cyst. No peripancreatic edema.

Spleen: No splenomegaly. No focal mass lesion.

Adrenals/Urinary Tract: Similar appearance nonobstructing stones in
the lower pole the right kidney measuring up to 4 x 6 mm. 8.4 cm
exophytic cyst posterior upper pole left kidney is stable. Exophytic
cyst anterior upper pole left kidney also similar to prior. 3 mm
nonobstructing stone lower pole left kidney is unchanged. No
evidence for hydroureter. The urinary bladder appears normal for the
degree of distention.

Stomach/Bowel: Stomach is nondistended. No gastric wall thickening.
No evidence of outlet obstruction. Duodenum is normally positioned
as is the ligament of Treitz. No small bowel wall thickening. No
small bowel dilatation. The terminal ileum is normal. The appendix
is normal. The focal mural thickening identified in the distal
ascending colon measures slightly smaller today at 3.1 x 1.7 cm
compared to 4.1 x 2.3 cm on the prior study. No substantial
diverticular change.

Vascular/Lymphatic: There is abdominal aortic atherosclerosis
without aneurysm. Stable upper normal porta hepatis lymph node at 14
mm short axis. There is no gastrohepatic ligament lymphadenopathy.
No intraperitoneal or retroperitoneal lymphadenopathy. No pelvic
sidewall lymphadenopathy.

Reproductive: Uterus surgically absent.  There is no adnexal mass.

Other: No intraperitoneal free fluid.

Musculoskeletal: Bone windows reveal no worrisome lytic or sclerotic
osseous lesions.
IMPRESSION: 1. Interval decrease in the focal wall thickening identified in the
distal ascending colon consistent with known neoplasm.
2. Stable appearance subtle hypoattenuating lesion in the medial
segment left liver.
3. No change in the mild lymphadenopathy in the porta hepatis.
4. Stable CT evaluation of the thorax. Right apical and posterior
right lower lobe pulmonary nodules are unchanged.
5. No new or progressive disease on today's study.
6.  Aortic Atherosclerois ([id number])

## 2019-09-28 ENCOUNTER — Inpatient Hospital Stay: Payer: PPO | Attending: Internal Medicine

## 2019-09-28 ENCOUNTER — Encounter (HOSPITAL_COMMUNITY): Payer: Self-pay

## 2019-09-28 ENCOUNTER — Ambulatory Visit (HOSPITAL_COMMUNITY)
Admission: RE | Admit: 2019-09-28 | Discharge: 2019-09-28 | Disposition: A | Discharge status: Home / Self Care | Payer: PPO | Source: Ambulatory Visit | Attending: Physician Assistant | Admitting: Physician Assistant

## 2019-09-28 ENCOUNTER — Other Ambulatory Visit: Payer: Self-pay

## 2019-09-28 DIAGNOSIS — C182 Malignant neoplasm of ascending colon: Secondary | ICD-10-CM | POA: Insufficient documentation

## 2019-09-28 DIAGNOSIS — Z79899 Other long term (current) drug therapy: Secondary | ICD-10-CM | POA: Diagnosis not present

## 2019-09-28 DIAGNOSIS — J449 Chronic obstructive pulmonary disease, unspecified: Secondary | ICD-10-CM | POA: Insufficient documentation

## 2019-09-28 DIAGNOSIS — C3431 Malignant neoplasm of lower lobe, right bronchus or lung: Secondary | ICD-10-CM | POA: Diagnosis not present

## 2019-09-28 DIAGNOSIS — C787 Secondary malignant neoplasm of liver and intrahepatic bile duct: Secondary | ICD-10-CM | POA: Insufficient documentation

## 2019-09-28 DIAGNOSIS — Z7901 Long term (current) use of anticoagulants: Secondary | ICD-10-CM | POA: Diagnosis not present

## 2019-09-28 DIAGNOSIS — C50911 Malignant neoplasm of unspecified site of right female breast: Secondary | ICD-10-CM | POA: Diagnosis not present

## 2019-09-28 DIAGNOSIS — I4891 Unspecified atrial fibrillation: Secondary | ICD-10-CM | POA: Diagnosis not present

## 2019-09-28 DIAGNOSIS — I10 Essential (primary) hypertension: Secondary | ICD-10-CM | POA: Insufficient documentation

## 2019-09-28 DIAGNOSIS — C189 Malignant neoplasm of colon, unspecified: Secondary | ICD-10-CM

## 2019-09-28 DIAGNOSIS — Z853 Personal history of malignant neoplasm of breast: Secondary | ICD-10-CM | POA: Diagnosis not present

## 2019-09-28 DIAGNOSIS — C3490 Malignant neoplasm of unspecified part of unspecified bronchus or lung: Secondary | ICD-10-CM | POA: Diagnosis not present

## 2019-09-28 DIAGNOSIS — E785 Hyperlipidemia, unspecified: Secondary | ICD-10-CM | POA: Insufficient documentation

## 2019-09-28 DIAGNOSIS — Z8673 Personal history of transient ischemic attack (TIA), and cerebral infarction without residual deficits: Secondary | ICD-10-CM | POA: Insufficient documentation

## 2019-09-28 DIAGNOSIS — K219 Gastro-esophageal reflux disease without esophagitis: Secondary | ICD-10-CM | POA: Diagnosis not present

## 2019-09-28 LAB — CMP (CANCER CENTER ONLY)
ALT: 12 U/L (ref 0–44)
AST: 15 U/L (ref 15–41)
Albumin: 3.9 g/dL (ref 3.5–5.0)
Alkaline Phosphatase: 59 U/L (ref 38–126)
Anion gap: 9 (ref 5–15)
BUN: 31 mg/dL — ABNORMAL HIGH (ref 8–23)
CO2: 28 mmol/L (ref 22–32)
Calcium: 9.3 mg/dL (ref 8.9–10.3)
Chloride: 106 mmol/L (ref 98–111)
Creatinine: 1.61 mg/dL — ABNORMAL HIGH (ref 0.44–1.00)
GFR, Est AFR Am: 35 mL/min — ABNORMAL LOW (ref >60)
GFR, Estimated: 30 mL/min — ABNORMAL LOW (ref >60)
Glucose, Bld: 91 mg/dL (ref 70–99)
Potassium: 5.1 mmol/L (ref 3.5–5.1)
Sodium: 143 mmol/L (ref 135–145)
Total Bilirubin: 0.4 mg/dL (ref 0.3–1.2)
Total Protein: 7 g/dL (ref 6.5–8.1)

## 2019-09-28 LAB — CBC WITH DIFFERENTIAL (CANCER CENTER ONLY)
Abs Immature Granulocytes: 0.02 10*3/uL (ref 0.00–0.07)
Basophils Absolute: 0.1 10*3/uL (ref 0.0–0.1)
Basophils Relative: 1 %
Eosinophils Absolute: 0.2 10*3/uL (ref 0.0–0.5)
Eosinophils Relative: 4 %
HCT: 49.3 % — ABNORMAL HIGH (ref 36.0–46.0)
Hemoglobin: 15.4 g/dL — ABNORMAL HIGH (ref 12.0–15.0)
Immature Granulocytes: 0 %
Lymphocytes Relative: 20 %
Lymphs Abs: 1.2 10*3/uL (ref 0.7–4.0)
MCH: 28.1 pg (ref 26.0–34.0)
MCHC: 31.2 g/dL (ref 30.0–36.0)
MCV: 90 fL (ref 80.0–100.0)
Monocytes Absolute: 0.6 10*3/uL (ref 0.1–1.0)
Monocytes Relative: 10 %
Neutro Abs: 4 10*3/uL (ref 1.7–7.7)
Neutrophils Relative %: 65 %
Platelet Count: 153 10*3/uL (ref 150–400)
RBC: 5.48 MIL/uL — ABNORMAL HIGH (ref 3.87–5.11)
RDW: 13.9 % (ref 11.5–15.5)
WBC Count: 6.2 10*3/uL (ref 4.0–10.5)
nRBC: 0 % (ref 0.0–0.2)

## 2019-10-01 ENCOUNTER — Other Ambulatory Visit: Payer: Self-pay

## 2019-10-01 ENCOUNTER — Encounter: Payer: Self-pay | Admitting: Internal Medicine

## 2019-10-01 ENCOUNTER — Inpatient Hospital Stay (HOSPITAL_BASED_OUTPATIENT_CLINIC_OR_DEPARTMENT_OTHER): Payer: PPO | Admitting: Internal Medicine

## 2019-10-01 VITALS — BP 155/89 | HR 62 | Temp 98.7°F | Resp 18 | Ht 71.0 in | Wt 283.9 lb

## 2019-10-01 DIAGNOSIS — I4891 Unspecified atrial fibrillation: Secondary | ICD-10-CM

## 2019-10-01 DIAGNOSIS — R160 Hepatomegaly, not elsewhere classified: Secondary | ICD-10-CM | POA: Diagnosis not present

## 2019-10-01 DIAGNOSIS — C182 Malignant neoplasm of ascending colon: Secondary | ICD-10-CM | POA: Diagnosis not present

## 2019-10-01 DIAGNOSIS — C3490 Malignant neoplasm of unspecified part of unspecified bronchus or lung: Secondary | ICD-10-CM

## 2019-10-01 DIAGNOSIS — C3431 Malignant neoplasm of lower lobe, right bronchus or lung: Secondary | ICD-10-CM | POA: Diagnosis not present

## 2019-10-01 DIAGNOSIS — C229 Malignant neoplasm of liver, not specified as primary or secondary: Secondary | ICD-10-CM
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
## 2019-10-02 ENCOUNTER — Telehealth: Payer: Self-pay | Admitting: Internal Medicine

## 2019-10-02 NOTE — Telephone Encounter (Signed)
Scheduled appt per 10/5 los - mailed reminder letter with appt date and time

## 2019-10-11 ENCOUNTER — Other Ambulatory Visit: Payer: Self-pay

## 2019-10-12 ENCOUNTER — Ambulatory Visit (INDEPENDENT_AMBULATORY_CARE_PROVIDER_SITE_OTHER): Payer: PPO | Admitting: Family

## 2019-10-12 ENCOUNTER — Ambulatory Visit (INDEPENDENT_AMBULATORY_CARE_PROVIDER_SITE_OTHER): Payer: PPO

## 2019-10-12 ENCOUNTER — Other Ambulatory Visit: Payer: Self-pay

## 2019-10-12 ENCOUNTER — Encounter: Payer: Self-pay | Admitting: Family

## 2019-10-12 VITALS — BP 136/78 | HR 61 | Temp 96.4°F | Ht 71.0 in | Wt 282.0 lb

## 2019-10-12 DIAGNOSIS — Z23 Encounter for immunization: Secondary | ICD-10-CM

## 2019-10-12 DIAGNOSIS — M545 Low back pain, unspecified: Secondary | ICD-10-CM

## 2019-10-12 DIAGNOSIS — I4891 Unspecified atrial fibrillation: Secondary | ICD-10-CM | POA: Diagnosis not present

## 2019-10-12 DIAGNOSIS — N3001 Acute cystitis with hematuria: Secondary | ICD-10-CM

## 2019-10-12 LAB — MICROSCOPIC EXAMINATION: WBC, UA: >30 /hpf — AB (ref 0–5)

## 2019-10-12 LAB — URINALYSIS, COMPLETE
Bilirubin, UA: NEGATIVE
Glucose, UA: NEGATIVE
Ketones, UA: NEGATIVE
Nitrite, UA: POSITIVE — AB
Specific Gravity, UA: 1.025 (ref 1.005–1.030)
Urobilinogen, Ur: 0.2 mg/dL (ref 0.2–1.0)
pH, UA: 5 (ref 5.0–7.5)

## 2019-10-12 LAB — COAGUCHEK XS/INR WAIVED
INR: 2.3 — ABNORMAL HIGH (ref 0.9–1.1)
Prothrombin Time: 27.1 s

## 2019-10-12 MED ORDER — CEFTRIAXONE SODIUM 1 G IJ SOLR
1.0000 g | Freq: Once | INTRAMUSCULAR | Status: AC
  Administered 2019-10-12: 12:00:00 1 g via INTRAMUSCULAR

## 2019-10-12 MED ORDER — BACLOFEN 10 MG PO TABS: 10.0000 mg | ORAL_TABLET | Freq: Three times a day (TID) | ORAL | 0 refills | Status: DC

## 2019-10-12 MED ORDER — AMOXICILLIN-POT CLAVULANATE 875-125 MG PO TABS: 1.0000 | ORAL_TABLET | Freq: Two times a day (BID) | ORAL | 0 refills | Status: DC

## 2019-10-12 NOTE — Patient Instructions (Signed)
Description   Continue  taking 2.5mg  ( 1 tablet)  Everyday.  INR today was 2.3 (goal is 2-3). At goal!!       Urinary Tract Infection, Adult A urinary tract infection (UTI) is an infection of any part of the urinary tract. The urinary tract includes:  The kidneys.  The ureters.  The bladder.  The urethra. These organs make, store, and get rid of pee (urine) in the body. What are the causes? This is caused by germs (bacteria) in your genital area. These germs grow and cause swelling (inflammation) of your urinary tract. What increases the risk? You are more likely to develop this condition if:  You have a small, thin tube (catheter) to drain pee.  You cannot control when you pee or poop (incontinence).  You are female, and: ? You use these methods to prevent pregnancy: ? A medicine that kills sperm (spermicide). ? A device that blocks sperm (diaphragm). ? You have low levels of a female hormone (estrogen). ? You are pregnant.  You have genes that add to your risk.  You are sexually active.  You take antibiotic medicines.  You have trouble peeing because of: ? A prostate that is bigger than normal, if you are female. ? A blockage in the part of your body that drains pee from the bladder (urethra). ? A kidney stone. ? A nerve condition that affects your bladder (neurogenic bladder). ? Not getting enough to drink. ? Not peeing often enough.  You have other conditions, such as: ? Diabetes. ? A weak disease-fighting system (immune system). ? Sickle cell disease. ? Gout. ? Injury of the spine. What are the signs or symptoms? Symptoms of this condition include:  Needing to pee right away (urgently).  Peeing often.  Peeing small amounts often.  Pain or burning when peeing.  Blood in the pee.  Pee that smells bad or not like normal.  Trouble peeing.  Pee that is cloudy.  Fluid coming from the vagina, if you are female.  Pain in the belly or lower  back. Other symptoms include:  Throwing up (vomiting).  No urge to eat.  Feeling mixed up (confused).  Being tired and grouchy (irritable).  A fever.  Watery poop (diarrhea). How is this treated? This condition may be treated with:  Antibiotic medicine.  Other medicines.  Drinking enough water. Follow these instructions at home:  Medicines  Take over-the-counter and prescription medicines only as told by your doctor.  If you were prescribed an antibiotic medicine, take it as told by your doctor. Do not stop taking it even if you start to feel better. General instructions  Make sure you: ? Pee until your bladder is empty. ? Do not hold pee for a long time. ? Empty your bladder after sex. ? Wipe from front to back after pooping if you are a female. Use each tissue one time when you wipe.  Drink enough fluid to keep your pee pale yellow.  Keep all follow-up visits as told by your doctor. This is important. Contact a doctor if:  You do not get better after 1-2 days.  Your symptoms go away and then come back. Get help right away if:  You have very bad back pain.  You have very bad pain in your lower belly.  You have a fever.  You are sick to your stomach (nauseous).  You are throwing up. Summary  A urinary tract infection (UTI) is an infection of any part of the  urinary tract.  This condition is caused by germs in your genital area.  There are many risk factors for a UTI. These include having a small, thin tube to drain pee and not being able to control when you pee or poop.  Treatment includes antibiotic medicines for germs.  Drink enough fluid to keep your pee pale yellow. This information is not intended to replace advice given to you by your health care provider. Make sure you discuss any questions you have with your health care provider. Document Released: 05/31/2008 Document Revised: 11/30/2018 Document Reviewed: 06/22/2018 Elsevier Patient  Education  2020 [employer].

## 2019-10-12 NOTE — Progress Notes (Signed)
Subjective:    Patient ID: Tammie [name], female    DOB: October 03, 1941, 78 y.o.   MRN: [id number]  Chief Complaint  Patient presents with  . Back Pain   Pt presents to the office today with right lower back pain. She states she has a hx of UTI's, but this pain is different does not feel like it is related to her urine. She feels like this is a "catch" in her back.   She also needs her INR rechecked. She is currently taking warfarin for A fib. Denies any medications changes, bruising, bleeding, or changes in her diet. See anticoagulation flow sheet.  Back Pain This is a recurrent problem. The current episode started yesterday. The problem occurs constantly. The problem is unchanged. The pain is present in the lumbar spine (right). The pain does not radiate. The pain is at a severity of 9/10. The pain is moderate. Exacerbated by: moving  Associated symptoms include dysuria (slight). Pertinent negatives include no bladder incontinence, bowel incontinence, fever, leg pain, numbness, perianal numbness or weakness.      Review of Systems  Constitutional: Negative for fever.  Gastrointestinal: Negative for bowel incontinence.  Genitourinary: Positive for dysuria (slight). Negative for bladder incontinence.  Musculoskeletal: Positive for back pain.  Neurological: Negative for weakness and numbness.  All other systems reviewed and are negative.      Objective:   Physical Exam Vitals signs reviewed.  Constitutional:      General: She is not in acute distress.    Appearance: She is well-developed. She is obese.  Eyes:     Pupils: Pupils are equal, round, and reactive to light.  Neck:     Musculoskeletal: Normal range of motion and neck supple.     Thyroid: No thyromegaly.  Cardiovascular:     Rate and Rhythm: Normal rate and regular rhythm.     Heart sounds: Normal heart sounds. No murmur.  Pulmonary:     Effort: Pulmonary effort is normal. No respiratory distress.     Breath sounds:  Normal breath sounds. No wheezing.  Abdominal:     General: Bowel sounds are normal. There is no distension.     Palpations: Abdomen is soft.     Tenderness: There is no abdominal tenderness (no tenderness noted). There is no right CVA tenderness or left CVA tenderness.  Musculoskeletal: Normal range of motion.        General: No tenderness.  Skin:    General: Skin is warm and dry.  Neurological:     Mental Status: She is alert and oriented to person, place, and time.     Cranial Nerves: No cranial nerve deficit.     Deep Tendon Reflexes: Reflexes are normal and symmetric.  Psychiatric:        Behavior: Behavior normal.        Thought Content: Thought content normal.        Judgment: Judgment normal.       BP (!) 146/85   Pulse 67   Temp (!) 96.4 F (35.8 C) (Temporal)   Ht 5\' 11"  (1.803 m)   Wt 282 lb (127.9 kg)   SpO2 96%   BMI 39.33 kg/m      Assessment & Plan:  ANNELLA PROWELL comes in today with chief complaint of Back Pain   Diagnosis and orders addressed:  1. Acute low back pain without sciatica, unspecified back pain laterality Rest ROM exercises encouraged Baclofen as needed, I do think this is related  to her UTI. Pt is very set that this is not from her UTI. - Urinalysis, Complete - baclofen (LIORESAL) 10 MG tablet; Take 1 tablet (10 mg total) by mouth 3 (three) times daily.  Dispense: 30 each; Refill: 0  2. Acute cystitis with hematuria Force fluids RTO if pain worsens or does not improve  Culture pending - Urine Culture - cefTRIAXone (ROCEPHIN) injection 1 g - amoxicillin-clavulanate (AUGMENTIN) 875-125 MG tablet; Take 1 tablet by mouth 2 (two) times daily.  Dispense: 14 tablet; Refill: 0  3. Atrial fibrillation, unspecified type [facility]) Description   Continue  taking 2.5mg  ( 1 tablet)  Everyday.  INR today was 2.3 (goal is 2-3). At goal!!      - CoaguChek XS/INR Waived   Health Maintenance reviewed Diet and exercise encouraged  Follow up  plan: 4 weeks to recheck INR   Tammie Dun, FNP

## 2019-10-14 LAB — URINE CULTURE

## 2019-10-15 ENCOUNTER — Encounter (HOSPITAL_COMMUNITY): Payer: Self-pay | Admitting: Emergency Medicine

## 2019-10-15 ENCOUNTER — Observation Stay (HOSPITAL_COMMUNITY)
Admission: EM | Admit: 2019-10-15 | Discharge: 2019-10-16 | Disposition: A | Discharge status: Home / Self Care | Payer: PPO | Attending: Family Medicine | Admitting: Family Medicine

## 2019-10-15 ENCOUNTER — Emergency Department (HOSPITAL_COMMUNITY): Payer: PPO

## 2019-10-15 ENCOUNTER — Other Ambulatory Visit: Payer: Self-pay

## 2019-10-15 DIAGNOSIS — N281 Cyst of kidney, acquired: Secondary | ICD-10-CM | POA: Insufficient documentation

## 2019-10-15 DIAGNOSIS — J449 Chronic obstructive pulmonary disease, unspecified: Secondary | ICD-10-CM | POA: Insufficient documentation

## 2019-10-15 DIAGNOSIS — N1832 Chronic kidney disease, stage 3b: Secondary | ICD-10-CM | POA: Diagnosis not present

## 2019-10-15 DIAGNOSIS — C3431 Malignant neoplasm of lower lobe, right bronchus or lung: Secondary | ICD-10-CM | POA: Insufficient documentation

## 2019-10-15 DIAGNOSIS — I129 Hypertensive chronic kidney disease with stage 1 through stage 4 chronic kidney disease, or unspecified chronic kidney disease: Secondary | ICD-10-CM | POA: Diagnosis not present

## 2019-10-15 DIAGNOSIS — K219 Gastro-esophageal reflux disease without esophagitis: Secondary | ICD-10-CM | POA: Insufficient documentation

## 2019-10-15 DIAGNOSIS — R42 Dizziness and giddiness: Principal | ICD-10-CM | POA: Insufficient documentation

## 2019-10-15 DIAGNOSIS — Z8673 Personal history of transient ischemic attack (TIA), and cerebral infarction without residual deficits: Secondary | ICD-10-CM | POA: Diagnosis not present

## 2019-10-15 DIAGNOSIS — I2510 Atherosclerotic heart disease of native coronary artery without angina pectoris: Secondary | ICD-10-CM | POA: Diagnosis not present

## 2019-10-15 DIAGNOSIS — C189 Malignant neoplasm of colon, unspecified: Secondary | ICD-10-CM | POA: Insufficient documentation

## 2019-10-15 DIAGNOSIS — Z7901 Long term (current) use of anticoagulants: Secondary | ICD-10-CM | POA: Diagnosis not present

## 2019-10-15 DIAGNOSIS — Z20828 Contact with and (suspected) exposure to other viral communicable diseases: Secondary | ICD-10-CM | POA: Insufficient documentation

## 2019-10-15 DIAGNOSIS — I10 Essential (primary) hypertension: Secondary | ICD-10-CM | POA: Diagnosis not present

## 2019-10-15 DIAGNOSIS — Z7951 Long term (current) use of inhaled steroids: Secondary | ICD-10-CM | POA: Insufficient documentation

## 2019-10-15 DIAGNOSIS — Z853 Personal history of malignant neoplasm of breast: Secondary | ICD-10-CM | POA: Insufficient documentation

## 2019-10-15 DIAGNOSIS — R509 Fever, unspecified: Secondary | ICD-10-CM | POA: Diagnosis not present

## 2019-10-15 DIAGNOSIS — Z79899 Other long term (current) drug therapy: Secondary | ICD-10-CM | POA: Diagnosis not present

## 2019-10-15 DIAGNOSIS — I4891 Unspecified atrial fibrillation: Secondary | ICD-10-CM | POA: Diagnosis not present

## 2019-10-15 DIAGNOSIS — Z9981 Dependence on supplemental oxygen: Secondary | ICD-10-CM | POA: Insufficient documentation

## 2019-10-15 DIAGNOSIS — N390 Urinary tract infection, site not specified: Secondary | ICD-10-CM | POA: Diagnosis not present

## 2019-10-15 DIAGNOSIS — E669 Obesity, unspecified: Secondary | ICD-10-CM | POA: Diagnosis not present

## 2019-10-15 DIAGNOSIS — N183 Chronic kidney disease, stage 3 unspecified: Secondary | ICD-10-CM | POA: Insufficient documentation

## 2019-10-15 DIAGNOSIS — E785 Hyperlipidemia, unspecified: Secondary | ICD-10-CM | POA: Insufficient documentation

## 2019-10-15 DIAGNOSIS — J9601 Acute respiratory failure with hypoxia: Secondary | ICD-10-CM | POA: Diagnosis not present

## 2019-10-15 DIAGNOSIS — K8020 Calculus of gallbladder without cholecystitis without obstruction: Secondary | ICD-10-CM | POA: Diagnosis not present

## 2019-10-15 DIAGNOSIS — R0602 Shortness of breath: Secondary | ICD-10-CM | POA: Diagnosis not present

## 2019-10-15 LAB — CBC WITH DIFFERENTIAL/PLATELET
Abs Immature Granulocytes: 0.02 10*3/uL (ref 0.00–0.07)
Basophils Absolute: 0 10*3/uL (ref 0.0–0.1)
Basophils Relative: 0 %
Eosinophils Absolute: 0.1 10*3/uL (ref 0.0–0.5)
Eosinophils Relative: 2 %
HCT: 51.9 % — ABNORMAL HIGH (ref 36.0–46.0)
Hemoglobin: 16.3 g/dL — ABNORMAL HIGH (ref 12.0–15.0)
Immature Granulocytes: 0 %
Lymphocytes Relative: 14 %
Lymphs Abs: 1 10*3/uL (ref 0.7–4.0)
MCH: 28.1 pg (ref 26.0–34.0)
MCHC: 31.4 g/dL (ref 30.0–36.0)
MCV: 89.5 fL (ref 80.0–100.0)
Monocytes Absolute: 0.6 10*3/uL (ref 0.1–1.0)
Monocytes Relative: 9 %
Neutro Abs: 5 10*3/uL (ref 1.7–7.7)
Neutrophils Relative %: 75 %
Platelets: 150 10*3/uL (ref 150–400)
RBC: 5.8 MIL/uL — ABNORMAL HIGH (ref 3.87–5.11)
RDW: 13.6 % (ref 11.5–15.5)
WBC: 6.8 10*3/uL (ref 4.0–10.5)
nRBC: 0 % (ref 0.0–0.2)

## 2019-10-15 LAB — URINALYSIS, COMPLETE (UACMP) WITH MICROSCOPIC
Bacteria, UA: NONE SEEN
Bilirubin Urine: NEGATIVE
Glucose, UA: NEGATIVE mg/dL
Ketones, ur: 5 mg/dL — AB
Leukocytes,Ua: NEGATIVE
Nitrite: NEGATIVE
Protein, ur: 100 mg/dL — AB
Specific Gravity, Urine: 1.016 (ref 1.005–1.030)
pH: 5 (ref 5.0–8.0)

## 2019-10-15 LAB — INFLUENZA PANEL BY PCR (TYPE A & B)
Influenza A By PCR: NEGATIVE
Influenza B By PCR: NEGATIVE

## 2019-10-15 LAB — LACTIC ACID, PLASMA
Lactic Acid, Venous: 1.2 mmol/L (ref 0.5–1.9)
Lactic Acid, Venous: 1.7 mmol/L (ref 0.5–1.9)

## 2019-10-15 LAB — PROTIME-INR
INR: 3.1 — ABNORMAL HIGH (ref 0.8–1.2)
Prothrombin Time: 31.2 seconds — ABNORMAL HIGH (ref 11.4–15.2)

## 2019-10-15 LAB — COMPREHENSIVE METABOLIC PANEL
ALT: 14 U/L (ref 0–44)
AST: 18 U/L (ref 15–41)
Albumin: 3.9 g/dL (ref 3.5–5.0)
Alkaline Phosphatase: 38 U/L (ref 38–126)
Anion gap: 13 (ref 5–15)
BUN: 21 mg/dL (ref 8–23)
CO2: 26 mmol/L (ref 22–32)
Calcium: 9.1 mg/dL (ref 8.9–10.3)
Chloride: 104 mmol/L (ref 98–111)
Creatinine, Ser: 1.42 mg/dL — ABNORMAL HIGH (ref 0.44–1.00)
GFR calc Af Amer: 41 mL/min — ABNORMAL LOW (ref >60)
GFR calc non Af Amer: 35 mL/min — ABNORMAL LOW (ref >60)
Glucose, Bld: 105 mg/dL — ABNORMAL HIGH (ref 70–99)
Potassium: 4 mmol/L (ref 3.5–5.1)
Sodium: 143 mmol/L (ref 135–145)
Total Bilirubin: 0.7 mg/dL (ref 0.3–1.2)
Total Protein: 7.1 g/dL (ref 6.5–8.1)

## 2019-10-15 LAB — LIPASE, BLOOD: Lipase: 33 U/L (ref 11–51)

## 2019-10-15 MED ORDER — FLUTICASONE PROPIONATE HFA 110 MCG/ACT IN AERO
1.0000 | INHALATION_SPRAY | Freq: Two times a day (BID) | RESPIRATORY_TRACT | Status: DC
  Administered 2019-10-16 (×2): 1 via RESPIRATORY_TRACT
  Filled 2019-10-15: qty 12

## 2019-10-15 MED ORDER — BACLOFEN 10 MG PO TABS
10.0000 mg | ORAL_TABLET | Freq: Three times a day (TID) | ORAL | Status: DC
  Administered 2019-10-16 (×3): 10 mg via ORAL
  Filled 2019-10-15 (×3): qty 1

## 2019-10-15 MED ORDER — DIPHENHYDRAMINE HCL 25 MG PO CAPS: 25.0000 mg | ORAL_CAPSULE | Freq: Four times a day (QID) | ORAL | Status: DC | PRN

## 2019-10-15 MED ORDER — SODIUM CHLORIDE 0.9% FLUSH: 3.0000 mL | INTRAVENOUS | Status: DC | PRN

## 2019-10-15 MED ORDER — SODIUM CHLORIDE 0.9 % IV BOLUS: 1000.0000 mL | Freq: Once | INTRAVENOUS | Status: DC

## 2019-10-15 MED ORDER — DEXAMETHASONE SODIUM PHOSPHATE 10 MG/ML IJ SOLN
6.0000 mg | INTRAMUSCULAR | Status: DC
  Administered 2019-10-15: 6 mg via INTRAVENOUS
  Filled 2019-10-15: qty 1
  Filled 2019-10-15: qty 0.6

## 2019-10-15 MED ORDER — AMLODIPINE BESYLATE 5 MG PO TABS
5.0000 mg | ORAL_TABLET | Freq: Every day | ORAL | Status: DC
  Administered 2019-10-16: 5 mg via ORAL
  Filled 2019-10-15: qty 1

## 2019-10-15 MED ORDER — HYDROCODONE-ACETAMINOPHEN 5-325 MG PO TABS: 1.0000 | ORAL_TABLET | ORAL | Status: DC | PRN

## 2019-10-15 MED ORDER — AMOXICILLIN-POT CLAVULANATE 875-125 MG PO TABS
1.0000 | ORAL_TABLET | Freq: Two times a day (BID) | ORAL | Status: DC
  Administered 2019-10-16 (×2): 1 via ORAL
  Filled 2019-10-15 (×2): qty 1

## 2019-10-15 MED ORDER — SIMVASTATIN 10 MG PO TABS
10.0000 mg | ORAL_TABLET | Freq: Every day | ORAL | Status: DC
  Administered 2019-10-16: 10 mg via ORAL
  Filled 2019-10-15: qty 1

## 2019-10-15 MED ORDER — ACETAMINOPHEN 325 MG PO TABS
650.0000 mg | ORAL_TABLET | Freq: Once | ORAL | Status: AC
  Administered 2019-10-15: 650 mg via ORAL
  Filled 2019-10-15: qty 2

## 2019-10-15 MED ORDER — ALBUTEROL SULFATE HFA 108 (90 BASE) MCG/ACT IN AERS
8.0000 | INHALATION_SPRAY | Freq: Once | RESPIRATORY_TRACT | Status: AC
  Administered 2019-10-15: 8 via RESPIRATORY_TRACT
  Filled 2019-10-15: qty 6.7

## 2019-10-15 MED ORDER — METOPROLOL TARTRATE 50 MG PO TABS
75.0000 mg | ORAL_TABLET | Freq: Two times a day (BID) | ORAL | Status: DC
  Administered 2019-10-16 (×2): 75 mg via ORAL
  Filled 2019-10-15 (×2): qty 2

## 2019-10-15 MED ORDER — AEROCHAMBER PLUS FLO-VU MEDIUM MISC
1.0000 | Freq: Once | Status: AC
  Administered 2019-10-15: 1
  Filled 2019-10-15: qty 1

## 2019-10-15 MED ORDER — ONDANSETRON HCL 4 MG PO TABS: 4.0000 mg | ORAL_TABLET | Freq: Four times a day (QID) | ORAL | Status: DC | PRN

## 2019-10-15 MED ORDER — CHLORHEXIDINE GLUCONATE CLOTH 2 % EX PADS
6.0000 | MEDICATED_PAD | Freq: Every day | CUTANEOUS | Status: DC
  Administered 2019-10-16: 6 via TOPICAL

## 2019-10-15 MED ORDER — SODIUM CHLORIDE 0.9 % IV SOLN: 250.0000 mL | INTRAVENOUS | Status: DC | PRN

## 2019-10-15 MED ORDER — ACETAMINOPHEN 325 MG PO TABS
650.0000 mg | ORAL_TABLET | Freq: Four times a day (QID) | ORAL | Status: DC | PRN
  Filled 2019-10-15: qty 2

## 2019-10-15 MED ORDER — SODIUM CHLORIDE 0.9% FLUSH
3.0000 mL | Freq: Two times a day (BID) | INTRAVENOUS | Status: DC
  Administered 2019-10-16: 3 mL via INTRAVENOUS

## 2019-10-15 MED ORDER — ALBUTEROL SULFATE HFA 108 (90 BASE) MCG/ACT IN AERS
2.0000 | INHALATION_SPRAY | RESPIRATORY_TRACT | Status: DC | PRN
  Filled 2019-10-15: qty 6.7

## 2019-10-15 MED ORDER — ONDANSETRON HCL 4 MG/2ML IJ SOLN: 4.0000 mg | Freq: Four times a day (QID) | INTRAMUSCULAR | Status: DC | PRN

## 2019-10-15 MED ORDER — ACETAMINOPHEN 650 MG RE SUPP: 650.0000 mg | Freq: Four times a day (QID) | RECTAL | Status: DC | PRN

## 2019-10-15 MED ORDER — SODIUM CHLORIDE 0.9 % IV BOLUS
500.0000 mL | Freq: Once | INTRAVENOUS | Status: AC
  Administered 2019-10-15: 500 mL via INTRAVENOUS

## 2019-10-15 NOTE — ED Notes (Signed)
Pt reports she is incontinent of urine. Purewick placed at this time.
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
## 2019-10-15 NOTE — ED Notes (Signed)
Patient ambulated around the room. O2 sats dropped into the 80's. Vitals captured in chart at 1740.

## 2019-10-15 NOTE — ED Triage Notes (Signed)
Patient reports N/V/D x3 days. Reports hx IBS. Denies fever and cough.

## 2019-10-15 NOTE — ED Notes (Signed)
Pt swaying at bedside during standing orthostatics. Pt noticeably shob with labored breathing and increased RR.

## 2019-10-16 DIAGNOSIS — J9601 Acute respiratory failure with hypoxia: Secondary | ICD-10-CM | POA: Diagnosis not present

## 2019-10-16 LAB — CBC WITH DIFFERENTIAL/PLATELET
Abs Immature Granulocytes: 0.02 10*3/uL (ref 0.00–0.07)
Basophils Absolute: 0 10*3/uL (ref 0.0–0.1)
Basophils Relative: 0 %
Eosinophils Absolute: 0 10*3/uL (ref 0.0–0.5)
Eosinophils Relative: 0 %
HCT: 49.6 % — ABNORMAL HIGH (ref 36.0–46.0)
Hemoglobin: 15.6 g/dL — ABNORMAL HIGH (ref 12.0–15.0)
Immature Granulocytes: 0 %
Lymphocytes Relative: 10 %
Lymphs Abs: 0.6 10*3/uL — ABNORMAL LOW (ref 0.7–4.0)
MCH: 28.2 pg (ref 26.0–34.0)
MCHC: 31.5 g/dL (ref 30.0–36.0)
MCV: 89.5 fL (ref 80.0–100.0)
Monocytes Absolute: 0.1 10*3/uL (ref 0.1–1.0)
Monocytes Relative: 1 %
Neutro Abs: 5.1 10*3/uL (ref 1.7–7.7)
Neutrophils Relative %: 89 %
Platelets: 147 10*3/uL — ABNORMAL LOW (ref 150–400)
RBC: 5.54 MIL/uL — ABNORMAL HIGH (ref 3.87–5.11)
RDW: 13.8 % (ref 11.5–15.5)
WBC: 5.7 10*3/uL (ref 4.0–10.5)
nRBC: 0 % (ref 0.0–0.2)

## 2019-10-16 LAB — BASIC METABOLIC PANEL
Anion gap: 12 (ref 5–15)
BUN: 27 mg/dL — ABNORMAL HIGH (ref 8–23)
CO2: 22 mmol/L (ref 22–32)
Calcium: 7.8 mg/dL — ABNORMAL LOW (ref 8.9–10.3)
Chloride: 108 mmol/L (ref 98–111)
Creatinine, Ser: 1.28 mg/dL — ABNORMAL HIGH (ref 0.44–1.00)
GFR calc Af Amer: 46 mL/min — ABNORMAL LOW (ref >60)
GFR calc non Af Amer: 40 mL/min — ABNORMAL LOW (ref >60)
Glucose, Bld: 175 mg/dL — ABNORMAL HIGH (ref 70–99)
Potassium: 3.4 mmol/L — ABNORMAL LOW (ref 3.5–5.1)
Sodium: 142 mmol/L (ref 135–145)

## 2019-10-16 LAB — URINE CULTURE: Culture: NO GROWTH

## 2019-10-16 LAB — PROTIME-INR
INR: 2.3 — ABNORMAL HIGH (ref 0.8–1.2)
Prothrombin Time: 25 seconds — ABNORMAL HIGH (ref 11.4–15.2)

## 2019-10-16 LAB — SARS CORONAVIRUS 2 (TAT 6-24 HRS): SARS Coronavirus 2: NEGATIVE

## 2019-10-16 LAB — GLUCOSE, CAPILLARY: Glucose-Capillary: 169 mg/dL — ABNORMAL HIGH (ref 70–99)

## 2019-10-16 MED ORDER — HEPARIN SOD (PORK) LOCK FLUSH 100 UNIT/ML IV SOLN
500.0000 [IU] | INTRAVENOUS | Status: AC | PRN
  Administered 2019-10-16: 500 [IU]

## 2019-10-16 MED ORDER — SODIUM CHLORIDE 0.9% FLUSH: 10.0000 mL | INTRAVENOUS | Status: DC | PRN

## 2019-10-16 MED ORDER — AMLODIPINE BESYLATE 10 MG PO TABS: 10.0000 mg | ORAL_TABLET | Freq: Every day | ORAL | 0 refills | Status: DC

## 2019-10-16 MED ORDER — SODIUM CHLORIDE 0.9% FLUSH: 10.0000 mL | Freq: Two times a day (BID) | INTRAVENOUS | Status: DC

## 2019-10-16 MED ORDER — WARFARIN - PHARMACIST DOSING INPATIENT: Freq: Every day | Status: DC

## 2019-10-16 MED ORDER — WARFARIN SODIUM 2.5 MG PO TABS
2.5000 mg | ORAL_TABLET | Freq: Once | ORAL | Status: AC
  Administered 2019-10-16: 2.5 mg via ORAL
  Filled 2019-10-16: qty 1

## 2019-10-16 MED ORDER — PREDNISONE 20 MG PO TABS: 40.0000 mg | ORAL_TABLET | Freq: Every day | ORAL | 0 refills | Status: AC

## 2019-10-16 MED ORDER — AMLODIPINE BESYLATE 5 MG PO TABS
5.0000 mg | ORAL_TABLET | Freq: Once | ORAL | Status: AC
  Administered 2019-10-16: 5 mg via ORAL
  Filled 2019-10-16: qty 1

## 2019-10-16 MED ORDER — AMLODIPINE BESYLATE 10 MG PO TABS: 10.0000 mg | ORAL_TABLET | Freq: Every day | ORAL | Status: DC

## 2019-10-16 NOTE — Discharge Summary (Signed)
Physician Discharge Summary  Tammie [name]:[id number] DOB: Mar 07, 1941 DOA: 10/15/2019  PCP: [name], FNP  Admit date: 10/15/2019 Discharge date: 10/16/2019  Admitted From: Home Disposition: Home  Recommendations for Outpatient Follow-up:  1. Follow up with PCP in 1 week 2. Please obtain BMP/CBC in one week 3. Please follow up on the following pending results: None  Home Health: None Equipment/Devices: None  Discharge Condition: Stable CODE STATUS: Full code Diet recommendation: Heart healthy   Brief/Interim Summary:  Admission HPI written by Vianne Bulls, MD   Chief Complaint: Malaise, fatigue, N/V/D   HPI: Tammie [name] is a 78 y.o. female with medical history significant for lung cancer status post wedge resection, breast cancer, colon cancer with liver mets, atrial fibrillation on warfarin, COPD, and recent UTI currently on antibiotics, now presenting to the emergency department for evaluation of general malaise, lightheadedness, nausea, vomiting, and diarrhea.  Patient developed general malaise a few days ago with fatigue, right lower back pain, urinary incontinence, lightheadedness, nausea, loss of appetite, and loose stools.  She was diagnosed with a UTI on 10/12/2019 and started on Augmentin.  She continues to feel poorly in general.  She has poor appetite, severe nausea, feels as though she wants to vomit, but denies any abdominal pain.  She also reports a couple days of nasal congestion and nonproductive cough with mild dyspnea.  She denies any travel or known sick contacts, but recently had family visiting from [place].  ED Course: Upon arrival to the ED, patient is found to be febrile to 38 C, saturating 80% on room air, tachypneic, and with stable blood pressure.  EKG features atrial fibrillation and chest x-ray is negative for acute cardiopulmonary disease.  Chemistry panel is notable for creatinine 1.42, similar to priors.  CBC features a mild  chronic polycythemia.  Lactic acid is reassuringly normal.  INR is 3.1.  Patient was treated with Tylenol, 500 cc normal saline, and albuterol in the ED. COVID-19 testing is in process.   Hospital course:  Acute respiratory failure with hypoxia Appears to have been a transient event. Chest x-ray clear. No symptoms. PE unlikely with score of 0. Patient with a history of COPD and was treated wit steroids and bronchodilators which may have contributed to resolution of symptoms. Recommend close outpatient follow-up. PT evaluated with recommendations for no follow-up.  COPD Possibly exacerbation. Started on decadron and bronchodilators. No wheezing on day of discharge. Will discharge with very short steroid burst.  History of UTI Diagnosed by PCP. Continue home regimen.  CKD stage III Stable.  Essential hypertension Slightly elevated BP. Given an extra dose of amlodipine.  Atrial fibrillation Continue Warfarin and metoprolol  Lower extremity swelling Possibly dependent edema. Evidence of some mild venous congestion skin changes. Outpatient follow-up.   Discharge Diagnoses:  Principal Problem:   Acute respiratory failure with hypoxia (HCC) Active Problems:   Atrial fibrillation (HCC)   COPD with chronic bronchitis (HCC)   HTN (hypertension)   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 (HCC)   CKD (chronic kidney disease), stage III   Primary adenocarcinoma of colon ([place])   Acute lower UTI    Discharge Instructions   Allergies as of 10/16/2019      Reactions   Other Hives   Decaffeinated Lipton tea bags   Tape Hives   Contrast Media [iodinated Diagnostic Agents] Hives   Iohexol Other (See Comments)    Code: HIVES, Desc: hives w/ contrast '08// better w/  benadryl   Sulfa Antibiotics Other (See Comments)   REACTION: unknown   Sulfamethoxazole-trimethoprim Other (See Comments)   REACTION: unknown      Medication List    STOP taking these medications     promethazine 25 MG tablet Commonly known as: PHENERGAN     TAKE these medications   acetaminophen 650 MG CR tablet Commonly known as: TYLENOL Take 650-1,300 mg by mouth every 8 (eight) hours as needed for pain.   albuterol 108 (90 Base) MCG/ACT inhaler Commonly known as: ProAir HFA Inhale 2 puffs into the lungs every 6 (six) hours as needed for wheezing or shortness of breath.   amLODipine 10 MG tablet Commonly known as: NORVASC Take 1 tablet (10 mg total) by mouth daily. What changed:   medication strength  how much to take   amoxicillin-clavulanate 875-125 MG tablet Commonly known as: AUGMENTIN Take 1 tablet by mouth 2 (two) times daily.   baclofen 10 MG tablet Commonly known as: LIORESAL Take 1 tablet (10 mg total) by mouth 3 (three) times daily.   beclomethasone 40 MCG/ACT inhaler Commonly known as: QVAR Inhale 1 puff into the lungs 2 (two) times daily.   diphenhydrAMINE 25 MG tablet Commonly known as: BENADRYL Take 25 mg by mouth every 6 (six) hours as needed for allergies.   FISH OIL + D3 PO Take 1 capsule by mouth daily.   Fish Oil 1000 MG Caps Take 1 capsule by mouth daily.   FLAXSEED OIL PO Take 1 tablet by mouth at bedtime.   Garlic 7169 MG Caps Take 1,000 mg by mouth 2 (two) times daily.   Iron 142 (45 Fe) MG Tbcr Take 1 tablet by mouth daily.   metoprolol tartrate 100 MG tablet Commonly known as: LOPRESSOR Take 1 and 1/2 tablets by mouth twice daily   predniSONE 20 MG tablet Commonly known as: DELTASONE Take 2 tablets (40 mg total) by mouth daily with breakfast for 3 days. Start taking on: October 17, 2019   simvastatin 10 MG tablet Commonly known as: ZOCOR Take 1 tablet (10 mg total) by mouth daily.   warfarin 2.5 MG tablet Commonly known as: COUMADIN Take as directed. If you are unsure how to take this medication, talk to your nurse or doctor. Original instructions: Take 1 tablet by mouth once daily,  Then take 1/2 tablet once daily  on fridays What changed:   how much to take  how to take this  when to take this  additional instructions       Allergies  Allergen Reactions   Other Hives    Decaffeinated Lipton tea bags   Tape Hives   Contrast Media [Iodinated Diagnostic Agents] Hives   Iohexol Other (See Comments)     Code: HIVES, Desc: hives w/ contrast '08// better w/ benadryl    Sulfa Antibiotics Other (See Comments)    REACTION: unknown   Sulfamethoxazole-Trimethoprim Other (See Comments)    REACTION: unknown    Consultations:  None   Procedures/Studies: Ct Abdomen Pelvis Wo Contrast  Result Date: 09/28/2019 CLINICAL DATA:  Non-small cell lung cancer diagnosed in 2012, right breast cancer in 2014. History of colon cancer with metastatic disease to the liver in 2017 history of both chemo radiotherapy and immunotherapy. EXAM: CT CHEST, ABDOMEN AND PELVIS WITHOUT CONTRAST TECHNIQUE: Multidetector CT imaging of the chest, abdomen and pelvis was performed following the standard protocol without IV contrast. COMPARISON:  06/28/2019 FINDINGS: CT CHEST FINDINGS Cardiovascular: Right-sided Port-A-Cath terminates in the upper right atrium.  Heart size remains enlarged with signs of coronary artery disease. Generalized atherosclerotic changes throughout the thoracic aorta are unchanged. Mediastinum/Nodes: No signs of adenopathy in the mediastinum. Hilar structure shows limited assessment given lack of intravenous contrast. No axillary or supraclavicular lymphadenopathy. Lungs/Pleura: Post multiple wedge resections in the right chest and and findings of lung resection in the left upper lobe are stable. Nodule with surrounding ground-glass in the right lower lobe measures approximately 8 mm, unchanged since the prior exam and at least into all June 30, 2018. No consolidation or pleural effusion. Musculoskeletal: No signs of chest wall mass. Changes of right breast lumpectomy and axillary dissection similar to  prior study. CT ABDOMEN PELVIS FINDINGS Hepatobiliary: No signs of focal hepatic lesion on noncontrast evaluation. Large calcified gallstone in the gallbladder neck. No pericholecystic stranding. No signs of biliary ductal dilation. Pancreas: Unremarkable. No pancreatic ductal dilatation or surrounding inflammatory changes. Spleen: Normal in size without focal abnormality. Adrenals/Urinary Tract: Normal appearance of bilateral adrenal glands. Moderate calculus in the lower pole of the right kidney measuring 9 mm larger than on prior study where it measured 5 mm. There was a calculus in the right renal pelvis that has since passed. No ureteral calculi are demonstrated on today's study. Punctate calculus in the lower pole left kidney. Not changed from previous exam. Large left renal cysts are unchanged.  Largest approximately 0.9 cm. Stomach/Bowel: No signs of acute gastrointestinal process. Appendix is normal. Vascular/Lymphatic: Dense atherosclerotic calcifications throughout the abdominal aorta. No aneurysm. No signs of upper abdominal lymphadenopathy. No pelvic adenopathy. Reproductive: Post hysterectomy. Normal appearance of the urinary bladder. Other: No sign of free air or fluid in the abdomen or pelvis. Musculoskeletal: No sign of acute or destructive bone process. Spinal degenerative changes. IMPRESSION: 1. Stable exam, no signs of recurrent disease. 2. Right lower lobe part solid nodule is unchanged over multiple priors, attention on follow-up. 3. Interval passage of right renal pelvic calculus with enlargement of right lower pole calculus. 4. Large gallstone in the gallbladder. 5. Atherosclerosis and coronary artery disease. 6.  Aortic Atherosclerosis (ICD10-I70.0). Electronically Signed   By: Zetta Bills M.D.   On: 09/28/2019 15:53   Ct Chest Wo Contrast  Result Date: 09/28/2019 CLINICAL DATA:  Non-small cell lung cancer diagnosed in 2012, right breast cancer in 2014. History of colon cancer with  metastatic disease to the liver in 2017 history of both chemo radiotherapy and immunotherapy. EXAM: CT CHEST, ABDOMEN AND PELVIS WITHOUT CONTRAST TECHNIQUE: Multidetector CT imaging of the chest, abdomen and pelvis was performed following the standard protocol without IV contrast. COMPARISON:  06/28/2019 FINDINGS: CT CHEST FINDINGS Cardiovascular: Right-sided Port-A-Cath terminates in the upper right atrium. Heart size remains enlarged with signs of coronary artery disease. Generalized atherosclerotic changes throughout the thoracic aorta are unchanged. Mediastinum/Nodes: No signs of adenopathy in the mediastinum. Hilar structure shows limited assessment given lack of intravenous contrast. No axillary or supraclavicular lymphadenopathy. Lungs/Pleura: Post multiple wedge resections in the right chest and and findings of lung resection in the left upper lobe are stable. Nodule with surrounding ground-glass in the right lower lobe measures approximately 8 mm, unchanged since the prior exam and at least into all June 30, 2018. No consolidation or pleural effusion. Musculoskeletal: No signs of chest wall mass. Changes of right breast lumpectomy and axillary dissection similar to prior study. CT ABDOMEN PELVIS FINDINGS Hepatobiliary: No signs of focal hepatic lesion on noncontrast evaluation. Large calcified gallstone in the gallbladder neck. No pericholecystic stranding. No signs of  biliary ductal dilation. Pancreas: Unremarkable. No pancreatic ductal dilatation or surrounding inflammatory changes. Spleen: Normal in size without focal abnormality. Adrenals/Urinary Tract: Normal appearance of bilateral adrenal glands. Moderate calculus in the lower pole of the right kidney measuring 9 mm larger than on prior study where it measured 5 mm. There was a calculus in the right renal pelvis that has since passed. No ureteral calculi are demonstrated on today's study. Punctate calculus in the lower pole left kidney. Not changed  from previous exam. Large left renal cysts are unchanged.  Largest approximately 0.9 cm. Stomach/Bowel: No signs of acute gastrointestinal process. Appendix is normal. Vascular/Lymphatic: Dense atherosclerotic calcifications throughout the abdominal aorta. No aneurysm. No signs of upper abdominal lymphadenopathy. No pelvic adenopathy. Reproductive: Post hysterectomy. Normal appearance of the urinary bladder. Other: No sign of free air or fluid in the abdomen or pelvis. Musculoskeletal: No sign of acute or destructive bone process. Spinal degenerative changes. IMPRESSION: 1. Stable exam, no signs of recurrent disease. 2. Right lower lobe part solid nodule is unchanged over multiple priors, attention on follow-up. 3. Interval passage of right renal pelvic calculus with enlargement of right lower pole calculus. 4. Large gallstone in the gallbladder. 5. Atherosclerosis and coronary artery disease. 6.  Aortic Atherosclerosis (ICD10-I70.0). Electronically Signed   By: Zetta Bills M.D.   On: 09/28/2019 15:53   Dg Chest Portable 1 View  Result Date: 10/15/2019 CLINICAL DATA:  Shortness of breath EXAM: PORTABLE CHEST 1 VIEW COMPARISON:  May 27, 2017 chest x-ray and September 28, 2019 CT scan FINDINGS: Postoperative changes seen on the left. Stable right Port-A-Cath. No change in the cardiomediastinal silhouette. No nodules, masses, or focal infiltrates. IMPRESSION: No acute interval change.  Postoperative changes on the left. Electronically Signed   By: Dorise Bullion III M.D   On: 10/15/2019 14:27      Subjective: No dyspnea or chest pain today. Feels well.  Discharge Exam: Vitals:   10/16/19 0816 10/16/19 1320  BP:  (!) 171/98  Pulse:  74  Resp:  20  Temp:  98.1 F (36.7 C)  SpO2: 92% 97%   Vitals:   10/16/19 0448 10/16/19 0448 10/16/19 0816 10/16/19 1320  BP: (!) 161/104   (!) 171/98  Pulse: 75   74  Resp: 20   20  Temp:  98.5 F (36.9 C)  98.1 F (36.7 C)  TempSrc:  Oral  Oral  SpO2: 93%   92% 97%    General: Pt is alert, awake, not in acute distress Cardiovascular: RRR, S1/S2 +, no rubs, no gallops Respiratory: CTA bilaterally, no wheezing, no rhonchi Abdominal: Soft, NT, ND, bowel sounds + Extremities: no edema, no cyanosis    The results of significant diagnostics from this hospitalization (including imaging, microbiology, ancillary and laboratory) are listed below for reference.     Microbiology: Recent Results (from the past 240 hour(s))  Microscopic Examination     Status: Abnormal   Collection Time: 10/12/19 11:05 AM   URINE  Result Value Ref Range Status   WBC, UA >30 (A) 0 - 5 /hpf Final   RBC 11-30 (A) 0 - 2 /hpf Final   Epithelial Cells (non renal) 0-10 0 - 10 /hpf Final   Renal Epithel, UA 0-10 (A) None seen /hpf Final   Mucus, UA Present Not Estab. Final   Bacteria, UA Many (A) None seen/Few Final  Urine Culture     Status: Abnormal   Collection Time: 10/12/19 11:40 AM   Specimen: Urine  URINE  Result Value Ref Range Status   Urine Culture, Routine Final report (A)  Final   Organism ID, Bacteria Klebsiella pneumoniae (A)  Final    Comment: Greater than 100,000 colony forming units per mL Cefazolin <=4 ug/mL Cefazolin with an MIC <=16 predicts susceptibility to the oral agents cefaclor, cefdinir, cefpodoxime, cefprozil, cefuroxime, cephalexin, and loracarbef when used for therapy of uncomplicated urinary tract infections due to E. coli, Klebsiella pneumoniae, and Proteus mirabilis.    Antimicrobial Susceptibility Comment  Final    Comment:       ** S = Susceptible; I = Intermediate; R = Resistant **                    P = Positive; N = Negative             MICS are expressed in micrograms per mL    Antibiotic                 RSLT#1    RSLT#2    RSLT#3    RSLT#4 Amoxicillin/Clavulanic Acid    S Ampicillin                     R Cefepime                       S Ceftriaxone                    S Cefuroxime                     S Ciprofloxacin                   S Ertapenem                      S Gentamicin                     S Imipenem                       S Levofloxacin                   S Meropenem                      S Nitrofurantoin                 S Piperacillin/Tazobactam        S Tetracycline                   S Tobramycin                     S Trimethoprim/Sulfa             S   SARS CORONAVIRUS 2 (TAT 6-24 HRS) Nasopharyngeal Nasopharyngeal Swab     Status: None   Collection Time: 10/15/19  6:04 PM   Specimen: Nasopharyngeal Swab  Result Value Ref Range Status   SARS Coronavirus 2 NEGATIVE NEGATIVE Final    Comment: (NOTE) SARS-CoV-2 target nucleic acids are NOT DETECTED. The SARS-CoV-2 RNA is generally detectable in upper and lower respiratory specimens during the acute phase of infection. Negative results do not preclude SARS-CoV-2 infection, do not rule out co-infections with other pathogens, and should not be used as the sole basis for treatment or other patient management decisions.  Negative results must be combined with clinical observations, patient history, and epidemiological information. The expected result is Negative. Fact Sheet for Patients: SugarRoll.be Fact Sheet for Healthcare Providers: https://www.woods-mathews.com/ This test is not yet approved or cleared by the [place] FDA and  has been authorized for detection and/or diagnosis of SARS-CoV-2 by FDA under an Emergency Use Authorization (EUA). This EUA will remain  in effect (meaning this test can be used) for the duration of the COVID-19 declaration under Section 56 4(b)(1) of the Act, 21 U.S.C. section 360bbb-3(b)(1), unless the authorization is terminated or revoked sooner. Performed at [facility], [place] [address] [place], [place] [address]      Labs: BNP (last 3 results) No results for input(s): BNP in the last 8760 hours. Basic Metabolic Panel: Recent Labs  Lab  10/15/19 1448 10/16/19 1115  NA 143 142  K 4.0 3.4*  CL 104 108  CO2 26 22  GLUCOSE 105* 175*  BUN 21 27*  CREATININE 1.42* 1.28*  CALCIUM 9.1 7.8*   Liver Function Tests: Recent Labs  Lab 10/15/19 1448  AST 18  ALT 14  ALKPHOS 38  BILITOT 0.7  PROT 7.1  ALBUMIN 3.9   Recent Labs  Lab 10/15/19 1448  LIPASE 33   No results for input(s): AMMONIA in the last 168 hours. CBC: Recent Labs  Lab 10/15/19 1448 10/16/19 0558  WBC 6.8 5.7  NEUTROABS 5.0 5.1  HGB 16.3* 15.6*  HCT 51.9* 49.6*  MCV 89.5 89.5  PLT 150 147*   Cardiac Enzymes: No results for input(s): CKTOTAL, CKMB, CKMBINDEX, TROPONINI in the last 168 hours. BNP: Invalid input(s): POCBNP CBG: Recent Labs  Lab 10/16/19 0734  GLUCAP 169*   D-Dimer No results for input(s): DDIMER in the last 72 hours. Hgb A1c No results for input(s): HGBA1C in the last 72 hours. Lipid Profile No results for input(s): CHOL, HDL, LDLCALC, TRIG, CHOLHDL, LDLDIRECT in the last 72 hours. Thyroid function studies No results for input(s): TSH, T4TOTAL, T3FREE, THYROIDAB in the last 72 hours.  Invalid input(s): FREET3 Anemia work up No results for input(s): VITAMINB12, FOLATE, FERRITIN, TIBC, IRON, RETICCTPCT in the last 72 hours. Urinalysis    Component Value Date/Time   COLORURINE YELLOW 10/15/2019 1945   APPEARANCEUR HAZY (A) 10/15/2019 1945   APPEARANCEUR Cloudy (A) 10/12/2019 1105   LABSPEC 1.016 10/15/2019 1945   LABSPEC 1.020 03/03/2017 1501   PHURINE 5.0 10/15/2019 1945   GLUCOSEU NEGATIVE 10/15/2019 1945   GLUCOSEU Negative 03/03/2017 1501   HGBUR SMALL (A) 10/15/2019 [place] 10/15/2019 1945   BILIRUBINUR Negative 10/12/2019 1105   BILIRUBINUR Negative 03/03/2017 1501   KETONESUR 5 (A) 10/15/2019 1945   PROTEINUR 100 (A) 10/15/2019 1945   UROBILINOGEN 0.2 03/03/2017 1501   NITRITE NEGATIVE 10/15/2019 1945   LEUKOCYTESUR NEGATIVE 10/15/2019 1945   LEUKOCYTESUR Small 03/03/2017 1501    Sepsis Labs Invalid input(s): PROCALCITONIN,  WBC,  LACTICIDVEN Microbiology Recent Results (from the past 240 hour(s))  Microscopic Examination     Status: Abnormal   Collection Time: 10/12/19 11:05 AM   URINE  Result Value Ref Range Status   WBC, UA >30 (A) 0 - 5 /hpf Final   RBC 11-30 (A) 0 - 2 /hpf Final   Epithelial Cells (non renal) 0-10 0 - 10 /hpf Final   Renal Epithel, UA 0-10 (A) None seen /hpf Final   Mucus, UA Present Not Estab. Final   Bacteria, UA Many (A) None seen/Few Final  Urine Culture     Status: Abnormal   Collection Time: 10/12/19 11:40 AM   Specimen: Urine   URINE  Result Value Ref Range Status   Urine Culture, Routine Final report (A)  Final   Organism ID, Bacteria Klebsiella pneumoniae (A)  Final    Comment: Greater than 100,000 colony forming units per mL Cefazolin <=4 ug/mL Cefazolin with an MIC <=16 predicts susceptibility to the oral agents cefaclor, cefdinir, cefpodoxime, cefprozil, cefuroxime, cephalexin, and loracarbef when used for therapy of uncomplicated urinary tract infections due to E. coli, Klebsiella pneumoniae, and Proteus mirabilis.    Antimicrobial Susceptibility Comment  Final    Comment:       ** S = Susceptible; I = Intermediate; R = Resistant **                    P = Positive; N = Negative             MICS are expressed in micrograms per mL    Antibiotic                 RSLT#1    RSLT#2    RSLT#3    RSLT#4 Amoxicillin/Clavulanic Acid    S Ampicillin                     R Cefepime                       S Ceftriaxone                    S Cefuroxime                     S Ciprofloxacin                  S Ertapenem                      S Gentamicin                     S Imipenem                       S Levofloxacin                   S Meropenem                      S Nitrofurantoin                 S Piperacillin/Tazobactam        S Tetracycline                   S Tobramycin                     S Trimethoprim/Sulfa              S   SARS CORONAVIRUS 2 (TAT 6-24 HRS) Nasopharyngeal Nasopharyngeal Swab     Status: None   Collection Time: 10/15/19  6:04 PM   Specimen: Nasopharyngeal Swab  Result Value Ref Range Status   SARS Coronavirus 2 NEGATIVE NEGATIVE Final    Comment: (NOTE) SARS-CoV-2 target nucleic acids are NOT DETECTED. The SARS-CoV-2 RNA is generally detectable in upper and lower respiratory specimens during the acute phase of infection. Negative results do not preclude SARS-CoV-2 infection, do not rule  out co-infections with other pathogens, and should not be used as the sole basis for treatment or other patient management decisions. Negative results must be combined with clinical observations, patient history, and epidemiological information. The expected result is Negative. Fact Sheet for Patients: SugarRoll.be Fact Sheet for Healthcare Providers: https://www.woods-mathews.com/ This test is not yet approved or cleared by the [place] FDA and  has been authorized for detection and/or diagnosis of SARS-CoV-2 by FDA under an Emergency Use Authorization (EUA). This EUA will remain  in effect (meaning this test can be used) for the duration of the COVID-19 declaration under Section 56 4(b)(1) of the Act, 21 U.S.C. section 360bbb-3(b)(1), unless the authorization is terminated or revoked sooner. Performed at [facility], [place] [address], [place], [place] [address]     SIGNED:   Cordelia Poche, MD [facility] 10/16/2019, 2:54 PM

## 2019-10-16 NOTE — Progress Notes (Addendum)
ANTICOAGULATION CONSULT NOTE  Pharmacy Consult for warfarin Indication: atrial fibrillation  Vital Signs: Temp: 98.5 F (36.9 C) (10/20 0448) Temp Source: Oral (10/20 0448) BP: 161/104 (10/20 0448) Pulse Rate: 75 (10/20 0448)  Labs: Recent Labs    10/15/19 1448 10/16/19 0558  HGB 16.3* 15.6*  HCT 51.9* 49.6*  PLT 150 147*  LABPROT 31.2* 25.0*  INR 3.1* 2.3*  CREATININE 1.42*  --     Estimated Creatinine Clearance: 48.2 mL/min (A) (by C-G formula based on SCr of 1.42 mg/dL (H)).  Assessment: Pt is a 78YOF presenting w/ c/o N/V/D x3 days. Pt has hx of afib (on warfarin PTA), COPD, lung cancer s/p wedge resection, breast cancer, colon cancer w/ liver mets and was recently diagnosed w/ a UTI. Pharmacy to dose warfarin.   Baseline INR supratherapeutic (PTA INR therapeutic)  Prior anticoagulation: Warfarin 2.5mg  PO daily  Significant events: 10/19: Pt warfarin held for INR of 3.1 - last dose 10/18  Today, 10/16/2019:  CBC: Hgb 15.6, no baseline to compare to, but appears steady; Plts steady at 147  INR therapeutic at 2.3  Major drug interactions: decadron (may enhance the effects of warfarin)  No bleeding issues per nursing  Goal of Therapy: INR 2-3  Plan:  Warfarin 2.5 mg PO tonight at 18:00  Daily INR  CBC at least q72 hr while on warfarin  Monitor for signs of bleeding or thrombosis  Tammie [name] 10/16/2019,8:23 AM

## 2019-10-16 NOTE — Evaluation (Signed)
Physical Therapy Evaluation Patient Details Name: Tammie [name] MRN: [id number] DOB: 07/04/1941 Today's Date: 10/16/2019   History of Present Illness  78 y.o. female with medical history significant for lung cancer status post wedge resection, breast cancer, colon cancer with liver mets, atrial fibrillation on warfarin, COPD, and recent UTI and admitted for acute hypoxic respiratory failure.  Clinical Impression  Patient evaluated by Physical Therapy with no further acute PT needs identified. All education has been completed and the patient has no further questions.  Pt ambulated in hallway and utilized RW (has cane and rollator at home she occasionally uses).  Pt denies SOB and SPO2 94% room air upon returning to room.  See below for any follow-up Physical Therapy or equipment needs. PT is signing off. Thank you for this referral.     Follow Up Recommendations No PT follow up    Equipment Recommendations  None recommended by PT    Recommendations for Other Services       Precautions / Restrictions Precautions Precautions: Fall      Mobility  Bed Mobility               General bed mobility comments: pt up in recliner  Transfers Overall transfer level: Needs assistance Equipment used: Rolling walker (2 wheeled) Transfers: Sit to/from Stand Sit to Stand: Supervision            Ambulation/Gait Ambulation/Gait assistance: Supervision Gait Distance (Feet): 340 Feet Assistive device: Rolling walker (2 wheeled) Gait Pattern/deviations: Step-through pattern;Decreased stride length     General Gait Details: no unsteadiness with RW, SPO2 94% on room air upon returning to room, pt denies any symptoms however does report right knee moderate pain with ambulating  Stairs            Wheelchair Mobility    Modified Rankin (Stroke Patients Only)       Balance                                             Pertinent Vitals/Pain Pain  Assessment: No/denies pain    Home Living Family/patient expects to be discharged to:: Private residence Living Arrangements: Spouse/significant other Available Help at Discharge: Family Type of Home: [place]: One level Home Equipment: Environmental consultant - 4 wheels;Cane - single point      Prior Function Level of Independence: Independent               Hand Dominance        Extremity/Trunk Assessment   Upper Extremity Assessment Upper Extremity Assessment: Overall WFL for tasks assessed    Lower Extremity Assessment Lower Extremity Assessment: Overall WFL for tasks assessed    Cervical / Trunk Assessment Cervical / Trunk Assessment: Normal  Communication   Communication: HOH  Cognition Arousal/Alertness: Awake/alert Behavior During Therapy: Flat affect Overall Cognitive Status: Within Functional Limits for tasks assessed                                        General Comments      Exercises     Assessment/Plan    PT Assessment Patent does not need any further PT services  PT Problem List         PT Treatment Interventions  PT Goals (Current goals can be found in the Care Plan section)  Acute Rehab PT Goals PT Goal Formulation: All assessment and education complete, DC therapy    Frequency     Barriers to discharge        Co-evaluation               AM-PAC PT "6 Clicks" Mobility  Outcome Measure Help needed turning from your back to your side while in a flat bed without using bedrails?: None Help needed moving from lying on your back to sitting on the side of a flat bed without using bedrails?: None Help needed moving to and from a bed to a chair (including a wheelchair)?: None Help needed standing up from a chair using your arms (e.g., wheelchair or bedside chair)?: None Help needed to walk in hospital room?: A Little Help needed climbing 3-5 steps with a railing? : A Little 6 Click Score: 22    End of  Session   Activity Tolerance: Patient tolerated treatment well Patient left: in chair;with chair alarm set;with call bell/phone within reach Nurse Communication: Mobility status PT Visit Diagnosis: Difficulty in walking, not elsewhere classified (R26.2)    Time: 6333-5456 PT Time Calculation (min) (ACUTE ONLY): 16 min   Charges:   PT Evaluation $PT Eval Low Complexity: Kirby, PT, DPT Acute Rehabilitation Services Office: [phone] Pager: [phone]  Trena Platt 10/16/2019, 12:22 PM

## 2019-10-16 NOTE — Progress Notes (Signed)
Patient is stable for discharge. Discharge instructions and medications have been reviewed with the patient and all questions answered. AVS and prescriptions given to patient.  Marabella Popiel, Fraser Din  10/16/2019 5:24 PM
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
## 2019-10-16 NOTE — Care Management CC44 (Signed)
Condition Code 44 Documentation Completed  Patient Details  Name: STEFANEE MCKELL MRN: [id number] Date of Birth: Nov 28, 1941   Condition Code 44 given:  Yes Patient signature on Condition Code 44 notice:  Yes Documentation of 2 MD's agreement:  Yes Code 44 added to claim:  Yes    Joaquin Courts, RN 10/16/2019, 4:43 PM

## 2019-10-16 NOTE — Care Management Obs Status (Signed)
[place] NOTIFICATION   Patient Details  Name: Tammie [name] MRN: [id number] Date of Birth: 06-Aug-1941   Medicare Observation Status Notification Given:  Yes    Joaquin Courts, RN 10/16/2019, 4:43 PM

## 2019-10-16 NOTE — Progress Notes (Signed)
Second attempt to complete admission, Pt HOH and also seems confuse. Not answering what was asked rather says something different.

## 2019-10-16 NOTE — Progress Notes (Signed)
Home medications retrieved from pharmacy and given to patient.   Lleyton Byers, Fraser Din 10/16/2019 5:45 PM

## 2019-10-20 IMAGING — CT CT CHEST W/O CM
2 of 4 series · 14 of 36 positions shown, 17 images · non-contrast
Comparison: 01/10/2018.

CLINICAL DATA: Left lung cancer, right breast cancer metastatic
colon cancer. Right lower quadrant pain and constipation. Chronic
cough and chest pain.

EXAM:
CT CHEST, ABDOMEN AND PELVIS WITHOUT CONTRAST
TECHNIQUE: Multidetector CT imaging of the chest, abdomen and pelvis was
performed following the standard protocol without IV contrast.

[Series 2: cap w/o · axial · non-contrast · 0.98mm/px · z∈[-743,-188]mm · 11 of 135 slices shown, 14 images]
[im 12/135  mediastinal]
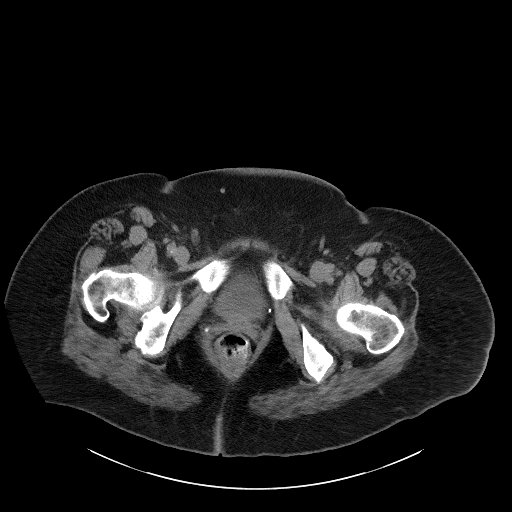
[im 12/135  lung]
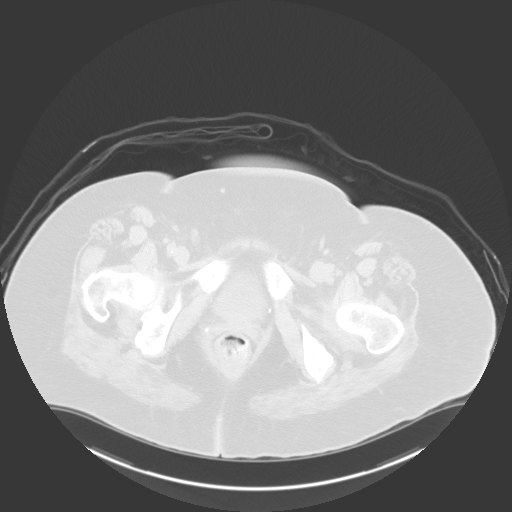
[im 23/135  lung]
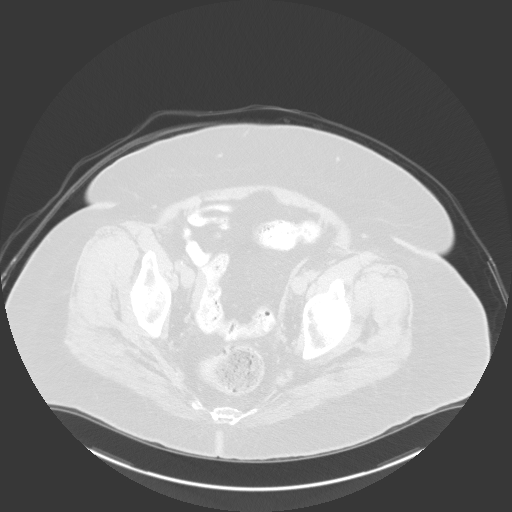
[im 34/135  lung]
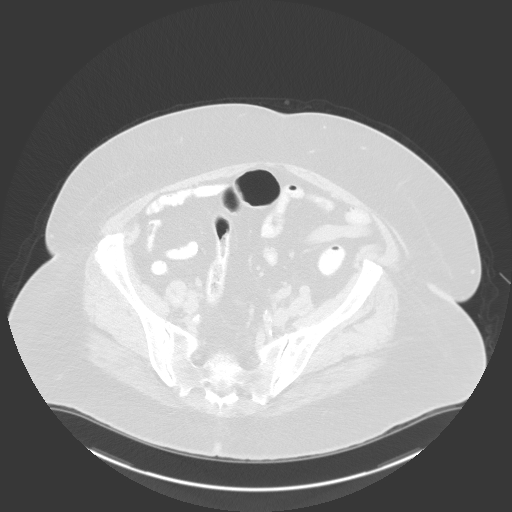
[im 45/135  lung]
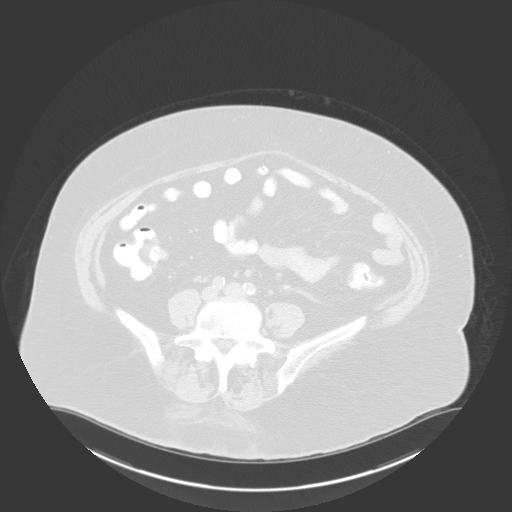
[im 56/135  mediastinal]
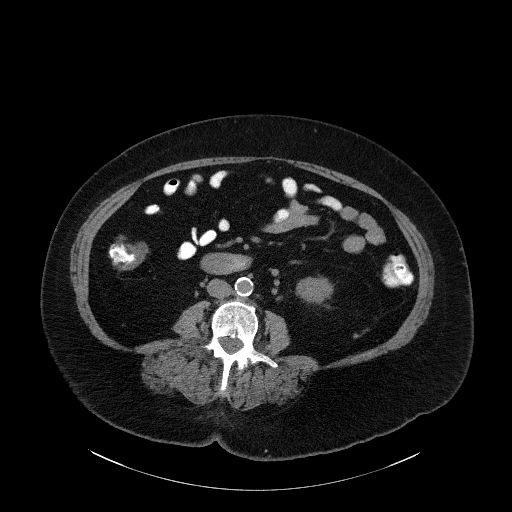
[im 56/135  lung]
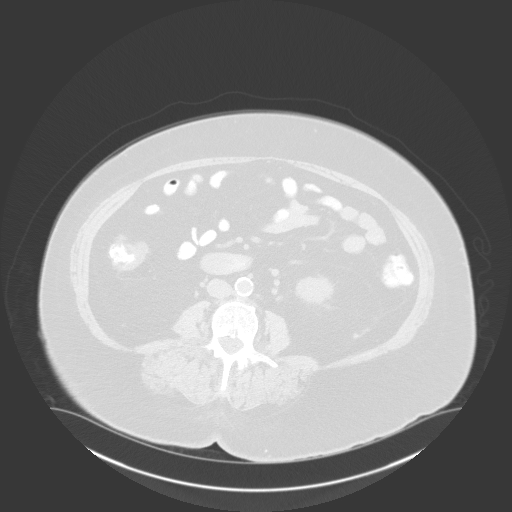
[im 68/135  lung]
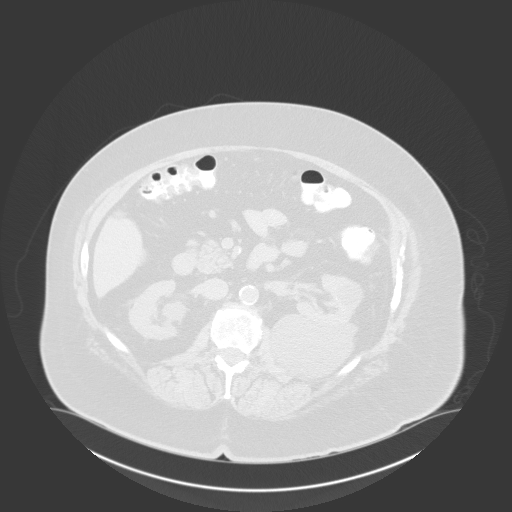
[im 79/135  lung]
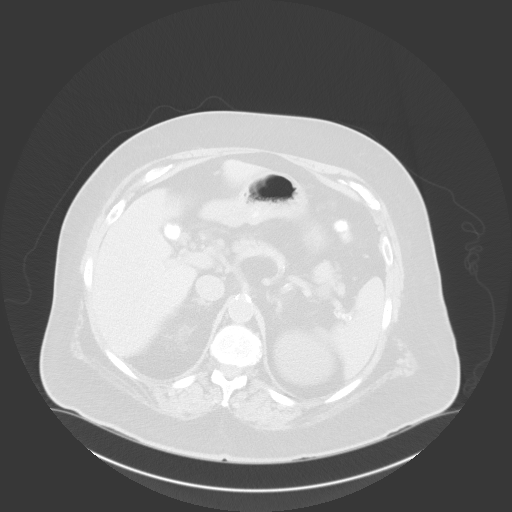
[im 90/135  lung]
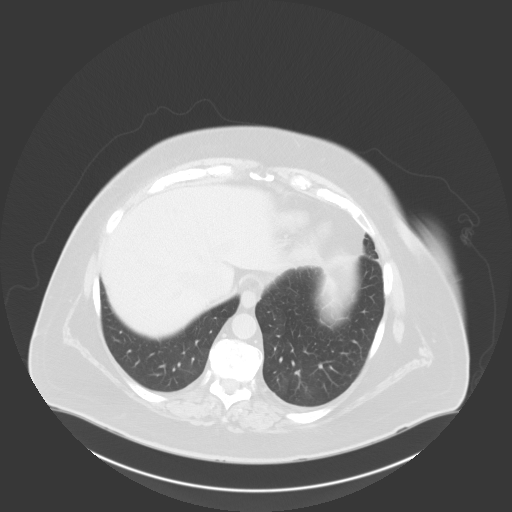
[im 101/135  mediastinal]
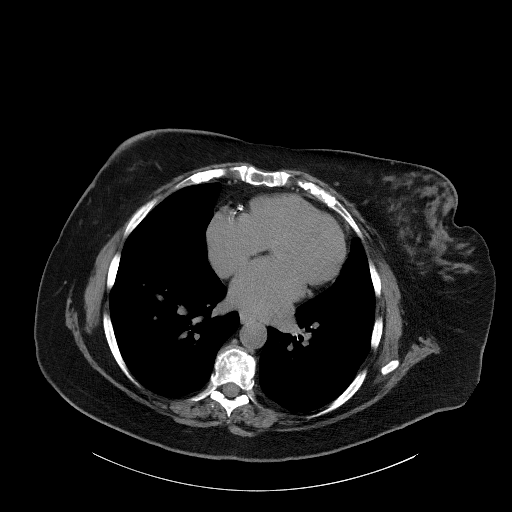
[im 101/135  lung]
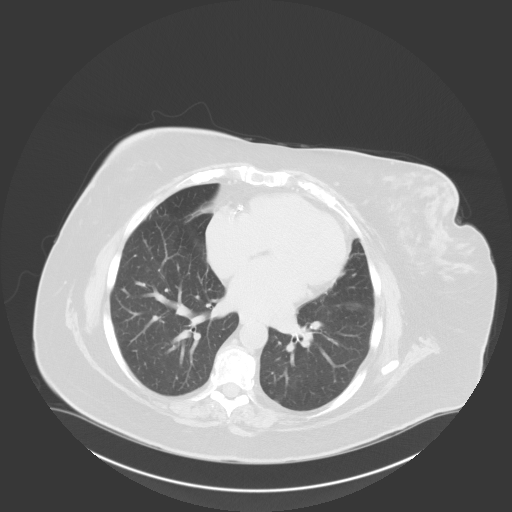
[im 112/135  lung]
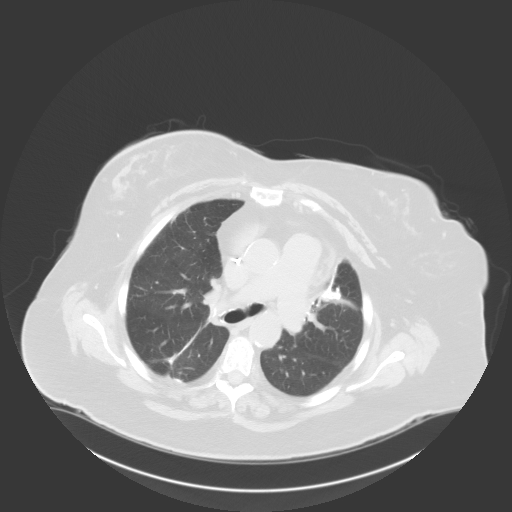
[im 123/135  lung]
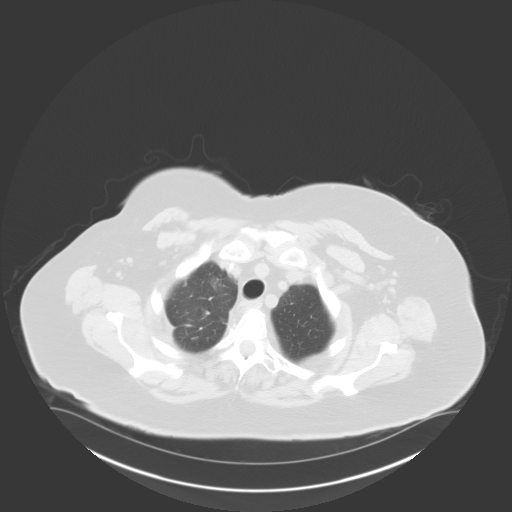

[Series 4: coronals · coronal · 1.07mm/px · 3 of 169 slices shown]
[im 34/169  lung]
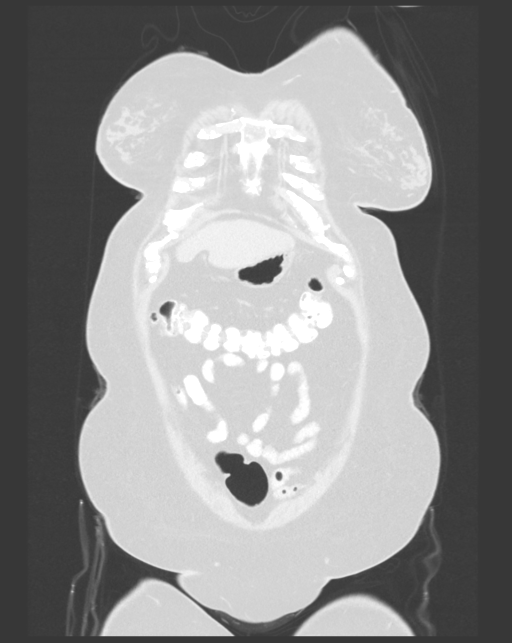
[im 68/169  lung]
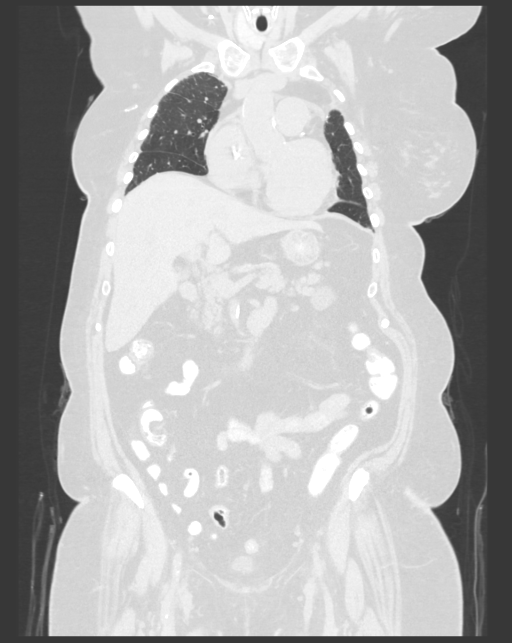
[im 101/169  lung]
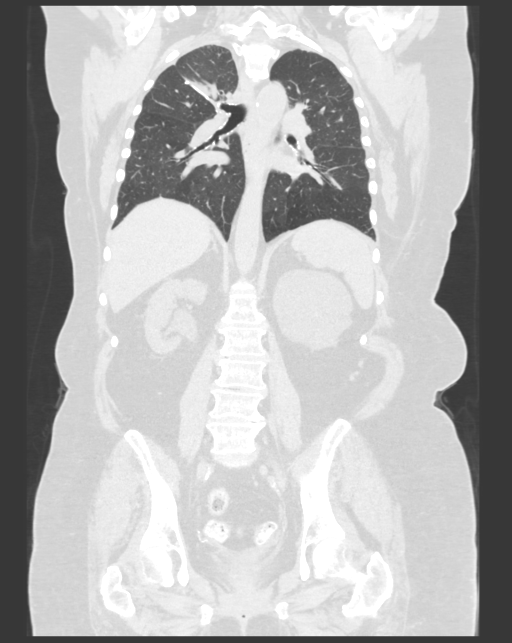

[14 of 36 positions shown; findings below may reference images not displayed]

FINDINGS: CT CHEST FINDINGS

Cardiovascular: Right IJ Port-A-Cath terminates in the right atrium.
Atherosclerotic calcification of the arterial vasculature, including
coronary arteries. Pulmonary arteries and heart are enlarged. No
pericardial effusion.

Mediastinum/Nodes: No pathologically enlarged mediastinal or
axillary lymph nodes. Hilar regions are difficult to evaluate
without IV contrast. Esophagus is grossly unremarkable.

Lungs/Pleura: Postoperative and post treatment scarring in the upper
right hemithorax. Subpleural nodule in the posterior right lower
lobe measures 8 mm, similar. Additional post treatment and
postoperative scarring in the left upper lobe. Lungs are otherwise
clear. No pleural fluid. Airway is unremarkable.

Musculoskeletal: Degenerative changes in the spine. No worrisome
lytic or sclerotic lesions. Surgical clips in the right axilla.

CT ABDOMEN PELVIS FINDINGS

Hepatobiliary: Faint hypoattenuating lesion along the periphery of
segment 4 measures 11 mm, unchanged (series 2, image 55). Liver is
otherwise unremarkable. A large stone is seen in the gallbladder. No
biliary ductal dilatation.

Pancreas: Negative.

Spleen: Negative.

Adrenals/Urinary Tract: Adrenal glands are unremarkable. Stone is
seen in the lower pole right kidney. Low-attenuation lesions off the
left kidney measure up to 9.2 cm and are incompletely evaluated
without post-contrast imaging. Tiny stone in the lower pole left
kidney. Ureters are decompressed. Bladder is grossly unremarkable.

Stomach/Bowel: Stomach, small bowel and appendix are unremarkable.
Eccentric wall thickening along the mid/distal ascending colon
(series 2, image 80), unchanged. Colon is otherwise unremarkable.

Vascular/Lymphatic: Atherosclerotic calcification of the arterial
vasculature without abdominal aortic aneurysm. No pathologically
enlarged lymph nodes.

Reproductive: Hysterectomy.  No adnexal mass.

Other: No free fluid.  Mesenteries and peritoneum are unremarkable.

Musculoskeletal: Degenerative changes in the spine. No worrisome
lytic or sclerotic lesions.
IMPRESSION: 1. Postoperative and post treatment changes in the hemi thoraces
bilaterally, without evidence of recurrent or metastatic disease.
2. Eccentric wall thickening involving the mid/distal ascending
colon, stable. 8 mm subpleural right lower lobe nodule, stable.
3. Cholelithiasis.
4. Bilateral renal stones.
5. Aortic atherosclerosis ([id number]). Coronary artery
calcification.

## 2019-10-23 DIAGNOSIS — R300 Dysuria: Secondary | ICD-10-CM | POA: Diagnosis not present

## 2019-10-25 ENCOUNTER — Other Ambulatory Visit: Payer: Self-pay

## 2019-10-26 ENCOUNTER — Ambulatory Visit (INDEPENDENT_AMBULATORY_CARE_PROVIDER_SITE_OTHER): Payer: PPO | Admitting: Family

## 2019-10-26 ENCOUNTER — Encounter: Payer: Self-pay | Admitting: Family

## 2019-10-26 VITALS — BP 128/81 | HR 67 | Temp 96.6°F | Ht 71.0 in | Wt 278.4 lb

## 2019-10-26 DIAGNOSIS — R109 Unspecified abdominal pain: Secondary | ICD-10-CM | POA: Diagnosis not present

## 2019-10-26 DIAGNOSIS — Z09 Encounter for follow-up examination after completed treatment for conditions other than malignant neoplasm: Secondary | ICD-10-CM | POA: Diagnosis not present

## 2019-10-26 DIAGNOSIS — Z87440 Personal history of urinary (tract) infections: Secondary | ICD-10-CM | POA: Diagnosis not present

## 2019-10-26 DIAGNOSIS — B373 Candidiasis of vulva and vagina: Secondary | ICD-10-CM | POA: Diagnosis not present

## 2019-10-26 DIAGNOSIS — N3001 Acute cystitis with hematuria: Secondary | ICD-10-CM | POA: Diagnosis not present

## 2019-10-26 DIAGNOSIS — B3731 Acute candidiasis of vulva and vagina: Secondary | ICD-10-CM

## 2019-10-26 LAB — URINALYSIS, COMPLETE
Bilirubin, UA: NEGATIVE
Glucose, UA: NEGATIVE
Ketones, UA: NEGATIVE
Nitrite, UA: NEGATIVE
Protein,UA: NEGATIVE
Specific Gravity, UA: 1.02 (ref 1.005–1.030)
Urobilinogen, Ur: 0.2 mg/dL (ref 0.2–1.0)
pH, UA: 5 (ref 5.0–7.5)

## 2019-10-26 LAB — MICROSCOPIC EXAMINATION
Renal Epithel, UA: NONE SEEN /hpf
Yeast, UA: POSITIVE — AB

## 2019-10-26 MED ORDER — FLUCONAZOLE 150 MG PO TABS: 150.0000 mg | ORAL_TABLET | ORAL | 0 refills | Status: DC | PRN

## 2019-10-26 MED ORDER — CEPHALEXIN 500 MG PO CAPS: 500.0000 mg | ORAL_CAPSULE | Freq: Two times a day (BID) | ORAL | 0 refills | Status: DC

## 2019-10-26 NOTE — Patient Instructions (Signed)

## 2019-10-26 NOTE — Progress Notes (Signed)
Subjective:    Patient ID: Tammie [name], female    DOB: 1941/02/26, 78 y.o.   MRN: [id number]  Chief Complaint  Patient presents with  . Hospitalization Follow-up     HPI Pt presents to the office today for hospital follow up. Pt went to the ED on 10/15/19 with SOB and light headedness that was related to infection. She was discharged on 10/16/19.   She reports she is feeling better. She completed Augmentin. She denies dysuria or hematuria. States she continues to have have back pain. She also has had urinary incontinence twice since being discharge.    Review of Systems  All other systems reviewed and are negative.      Objective:   Physical Exam Vitals signs reviewed.  Constitutional:      General: She is not in acute distress.    Appearance: She is well-developed. She is obese.  HENT:     Head: Normocephalic and atraumatic.     Right Ear: Tympanic membrane normal. There is no impacted cerumen.     Left Ear: Tympanic membrane normal.  Eyes:     Pupils: Pupils are equal, round, and reactive to light.  Neck:     Musculoskeletal: Normal range of motion and neck supple.     Thyroid: No thyromegaly.  Cardiovascular:     Rate and Rhythm: Normal rate and regular rhythm.     Heart sounds: Normal heart sounds. No murmur.  Pulmonary:     Effort: Pulmonary effort is normal. No respiratory distress.     Breath sounds: Normal breath sounds. No wheezing.  Abdominal:     General: Bowel sounds are normal. There is no distension.     Palpations: Abdomen is soft.     Tenderness: There is no abdominal tenderness.  Musculoskeletal: Normal range of motion.        General: No tenderness.     Right lower leg: Edema (3+) present.     Left lower leg: Edema (3+) present.  Skin:    General: Skin is warm and dry.  Neurological:     Mental Status: She is alert and oriented to person, place, and time.     Cranial Nerves: No cranial nerve deficit.     Deep Tendon Reflexes: Reflexes are  normal and symmetric.  Psychiatric:        Behavior: Behavior normal.        Thought Content: Thought content normal.        Judgment: Judgment normal.     BP 128/81   Pulse 67   Temp (!) 96.6 F (35.9 C) (Temporal)   Ht _0  (1.803 m)   Wt 278 lb 6.4 oz (126.3 kg)   SpO2 96%   BMI 38.83 kg/m        Assessment & Plan:  Tammie [name] comes in today with chief complaint of Hospitalization Follow-up   Diagnosis and orders addressed:  1. Hospital discharge follow-up - CMP14+EGFR - CBC with Differential/Platelet  2. Hx: UTI (urinary tract infection) - CMP14+EGFR - CBC with Differential/Platelet - Urinalysis, Complete - Urine Culture  3. Flank pain - CMP14+EGFR - CBC with Differential/Platelet  4. Acute cystitis with hematuria Force fluids RTO if symptoms worsen or do not improve  Culture pending - cephALEXin (KEFLEX) 500 MG capsule; Take 1 capsule (500 mg total) by mouth 2 (two) times daily.  Dispense: 14 capsule; Refill: 0  5. Vagina, candidiasis Keep clean and dry - fluconazole (DIFLUCAN) 150 MG tablet; Take  1 tablet (150 mg total) by mouth every three (3) days as needed.  Dispense: 3 tablet; Refill: 0   Tammie Dun, FNP

## 2019-10-28 LAB — URINE CULTURE

## 2019-11-26 ENCOUNTER — Other Ambulatory Visit: Payer: Self-pay | Admitting: Family

## 2019-11-26 ENCOUNTER — Telehealth: Payer: Self-pay | Admitting: Family

## 2019-11-26 DIAGNOSIS — I10 Essential (primary) hypertension: Secondary | ICD-10-CM

## 2019-11-26 DIAGNOSIS — I4891 Unspecified atrial fibrillation: Secondary | ICD-10-CM

## 2019-11-26 MED ORDER — AMLODIPINE BESYLATE 10 MG PO TABS: 10.0000 mg | ORAL_TABLET | Freq: Every day | ORAL | 1 refills | Status: DC

## 2019-11-26 NOTE — Telephone Encounter (Signed)
Med sent in.

## 2019-11-26 NOTE — Telephone Encounter (Signed)
PA called from [facility] stating that patient was originally taking 5mg  of Amlodipine but was in the hospital recently and Dr moved Rx up to 10mg . PA says pt needs 3 mth supply sent to pt's pharmacy, which is [facility] in [place].

## 2019-12-31 ENCOUNTER — Inpatient Hospital Stay: Payer: PPO | Attending: Internal Medicine

## 2019-12-31 ENCOUNTER — Ambulatory Visit (HOSPITAL_COMMUNITY)
Admission: RE | Admit: 2019-12-31 | Discharge: 2019-12-31 | Disposition: A | Discharge status: Home / Self Care | Payer: PPO | Source: Ambulatory Visit | Attending: Internal Medicine | Admitting: Internal Medicine

## 2019-12-31 ENCOUNTER — Other Ambulatory Visit: Payer: Self-pay

## 2019-12-31 DIAGNOSIS — C182 Malignant neoplasm of ascending colon: Secondary | ICD-10-CM | POA: Insufficient documentation

## 2019-12-31 DIAGNOSIS — C3490 Malignant neoplasm of unspecified part of unspecified bronchus or lung: Secondary | ICD-10-CM

## 2019-12-31 DIAGNOSIS — C3431 Malignant neoplasm of lower lobe, right bronchus or lung: Secondary | ICD-10-CM | POA: Diagnosis not present

## 2019-12-31 DIAGNOSIS — C787 Secondary malignant neoplasm of liver and intrahepatic bile duct: Secondary | ICD-10-CM | POA: Diagnosis not present

## 2019-12-31 LAB — CMP (CANCER CENTER ONLY)
ALT: 10 U/L (ref 0–44)
AST: 16 U/L (ref 15–41)
Albumin: 4 g/dL (ref 3.5–5.0)
Alkaline Phosphatase: 41 U/L (ref 38–126)
Anion gap: 12 (ref 5–15)
BUN: 27 mg/dL — ABNORMAL HIGH (ref 8–23)
CO2: 27 mmol/L (ref 22–32)
Calcium: 9.2 mg/dL (ref 8.9–10.3)
Chloride: 104 mmol/L (ref 98–111)
Creatinine: 1.6 mg/dL — ABNORMAL HIGH (ref 0.44–1.00)
GFR, Est AFR Am: 35 mL/min — ABNORMAL LOW (ref >60)
GFR, Estimated: 31 mL/min — ABNORMAL LOW (ref >60)
Glucose, Bld: 84 mg/dL (ref 70–99)
Potassium: 4.8 mmol/L (ref 3.5–5.1)
Sodium: 143 mmol/L (ref 135–145)
Total Bilirubin: 0.9 mg/dL (ref 0.3–1.2)
Total Protein: 6.8 g/dL (ref 6.5–8.1)

## 2019-12-31 LAB — CBC WITH DIFFERENTIAL (CANCER CENTER ONLY)
Abs Immature Granulocytes: 0.01 10*3/uL (ref 0.00–0.07)
Basophils Absolute: 0.1 10*3/uL (ref 0.0–0.1)
Basophils Relative: 1 %
Eosinophils Absolute: 0.2 10*3/uL (ref 0.0–0.5)
Eosinophils Relative: 4 %
HCT: 47.3 % — ABNORMAL HIGH (ref 36.0–46.0)
Hemoglobin: 14.7 g/dL (ref 12.0–15.0)
Immature Granulocytes: 0 %
Lymphocytes Relative: 18 %
Lymphs Abs: 1 10*3/uL (ref 0.7–4.0)
MCH: 27.8 pg (ref 26.0–34.0)
MCHC: 31.1 g/dL (ref 30.0–36.0)
MCV: 89.6 fL (ref 80.0–100.0)
Monocytes Absolute: 0.5 10*3/uL (ref 0.1–1.0)
Monocytes Relative: 8 %
Neutro Abs: 3.9 10*3/uL (ref 1.7–7.7)
Neutrophils Relative %: 69 %
Platelet Count: 161 10*3/uL (ref 150–400)
RBC: 5.28 MIL/uL — ABNORMAL HIGH (ref 3.87–5.11)
RDW: 13.8 % (ref 11.5–15.5)
WBC Count: 5.6 10*3/uL (ref 4.0–10.5)
nRBC: 0 % (ref 0.0–0.2)

## 2019-12-31 LAB — CEA (IN HOUSE-CHCC): CEA (CHCC-In House): 1.38 ng/mL (ref 0.00–5.00)

## 2020-01-02 ENCOUNTER — Encounter: Payer: Self-pay | Admitting: Internal Medicine

## 2020-01-02 ENCOUNTER — Inpatient Hospital Stay (HOSPITAL_BASED_OUTPATIENT_CLINIC_OR_DEPARTMENT_OTHER): Payer: PPO | Admitting: Internal Medicine

## 2020-01-02 DIAGNOSIS — Z7901 Long term (current) use of anticoagulants: Secondary | ICD-10-CM | POA: Diagnosis not present

## 2020-01-02 DIAGNOSIS — I10 Essential (primary) hypertension: Secondary | ICD-10-CM

## 2020-01-02 DIAGNOSIS — C787 Secondary malignant neoplasm of liver and intrahepatic bile duct: Secondary | ICD-10-CM | POA: Diagnosis not present

## 2020-01-02 DIAGNOSIS — C229 Malignant neoplasm of liver, not specified as primary or secondary: Secondary | ICD-10-CM

## 2020-01-02 DIAGNOSIS — C189 Malignant neoplasm of colon, unspecified: Secondary | ICD-10-CM

## 2020-01-02 DIAGNOSIS — E785 Hyperlipidemia, unspecified: Secondary | ICD-10-CM

## 2020-01-02 DIAGNOSIS — C3431 Malignant neoplasm of lower lobe, right bronchus or lung: Secondary | ICD-10-CM | POA: Diagnosis not present

## 2020-01-02 DIAGNOSIS — Z79899 Other long term (current) drug therapy: Secondary | ICD-10-CM

## 2020-01-02 DIAGNOSIS — R5383 Other fatigue: Secondary | ICD-10-CM | POA: Diagnosis not present

## 2020-01-02 DIAGNOSIS — Z853 Personal history of malignant neoplasm of breast: Secondary | ICD-10-CM | POA: Diagnosis not present

## 2020-01-02 DIAGNOSIS — Z85038 Personal history of other malignant neoplasm of large intestine: Secondary | ICD-10-CM | POA: Diagnosis not present

## 2020-01-02 DIAGNOSIS — C3490 Malignant neoplasm of unspecified part of unspecified bronchus or lung: Secondary | ICD-10-CM
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
## 2020-01-03 ENCOUNTER — Telehealth: Payer: Self-pay | Admitting: Internal Medicine

## 2020-01-03 NOTE — Telephone Encounter (Signed)
Scheduled per los. Called and left msg. Mailed printout  °

## 2020-01-16 IMAGING — CT CT CHEST W/O CM
2 of 4 series · 14 of 36 positions shown, 17 images · non-contrast
Comparison: 04/03/2018

CLINICAL DATA: Left lung ca; rt breast ca; colon ca with liver and
LN mets; keytruda ongoing; Chronic cough and CP--non specific area;
lt flank pain; known Kidney stones and gallstones

EXAM:
CT CHEST, ABDOMEN AND PELVIS WITHOUT CONTRAST
TECHNIQUE: Multidetector CT imaging of the chest, abdomen and pelvis was
performed following the standard protocol without IV contrast.

[Series 2: cap w/o · axial · non-contrast · 0.88mm/px · z∈[-796,-281]mm · 11 of 125 slices shown, 14 images]
[im 11/125  mediastinal]
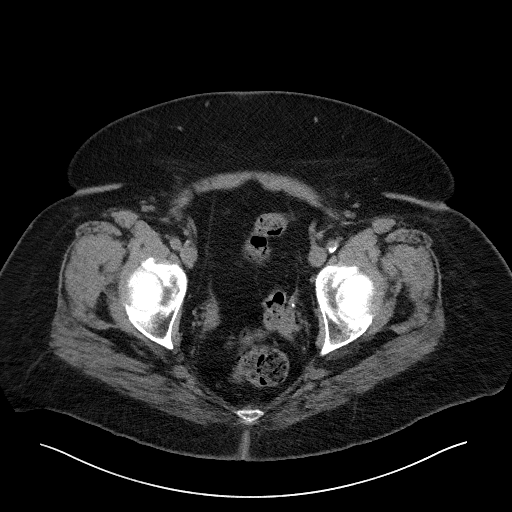
[im 11/125  lung]
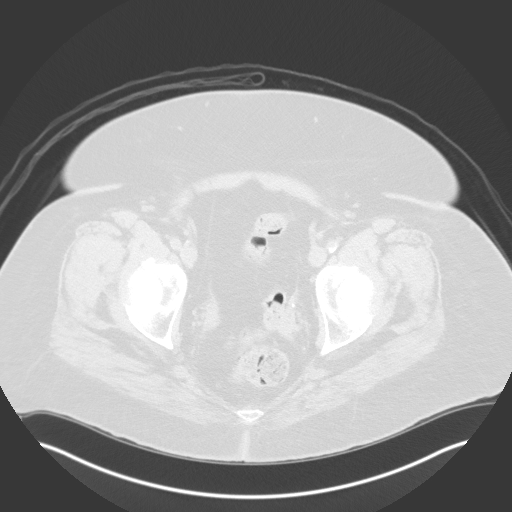
[im 21/125  lung]
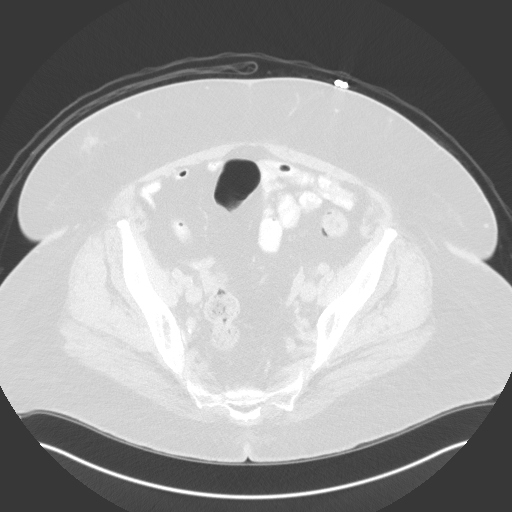
[im 32/125  lung]
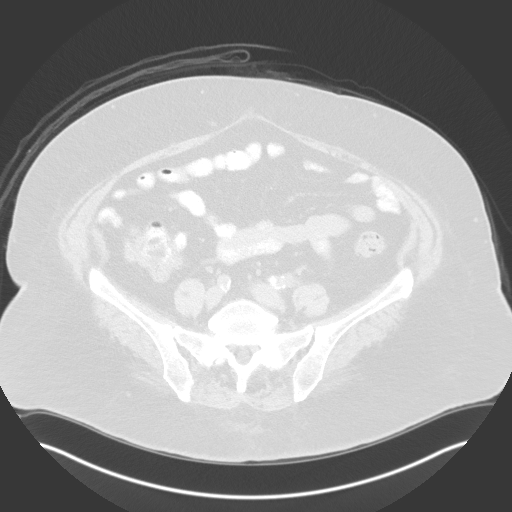
[im 42/125  lung]
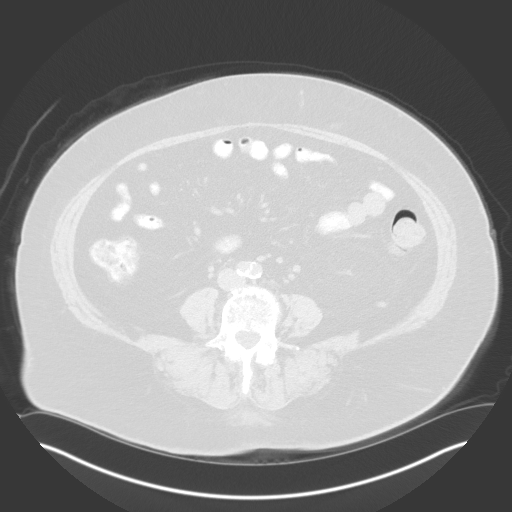
[im 52/125  mediastinal]
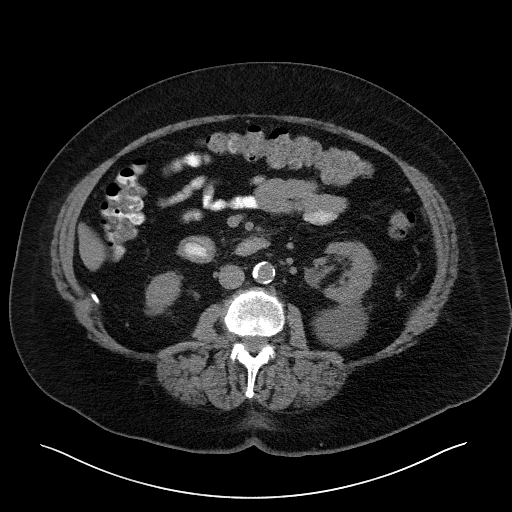
[im 52/125  lung]
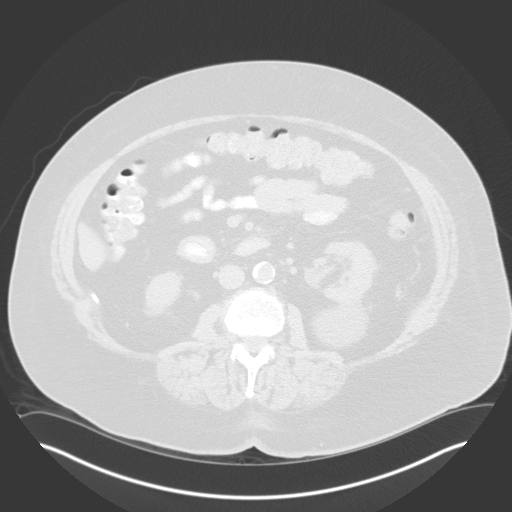
[im 63/125  lung]
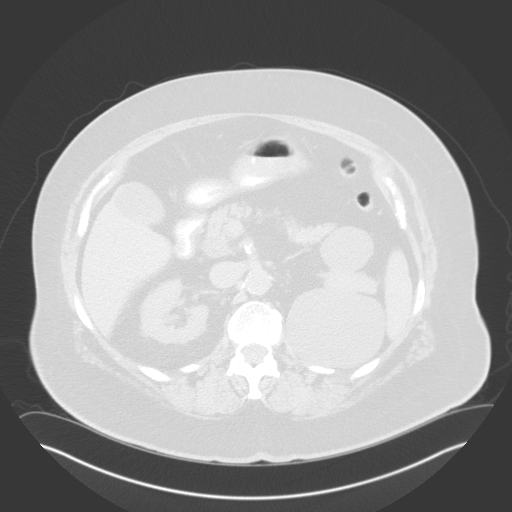
[im 73/125  lung]
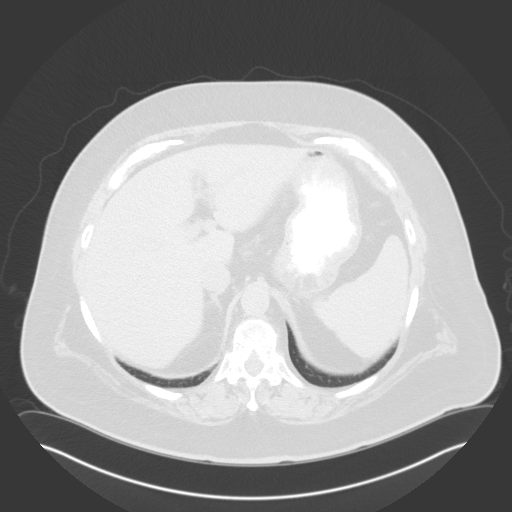
[im 83/125  lung]
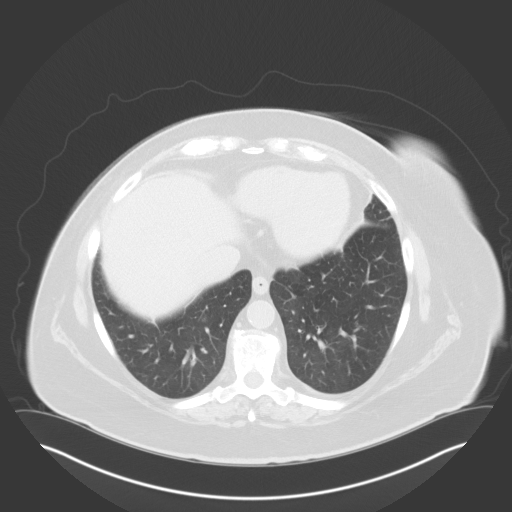
[im 94/125  mediastinal]
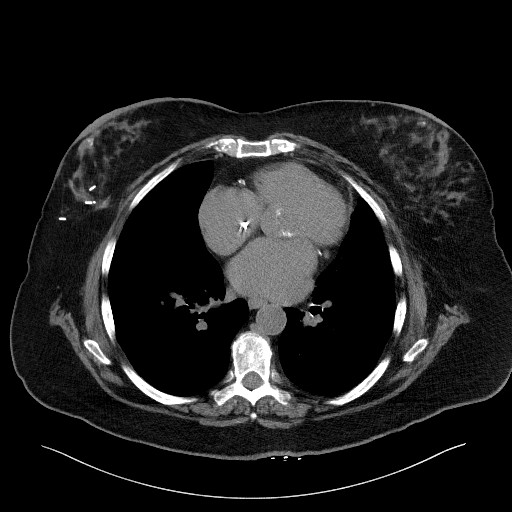
[im 94/125  lung]
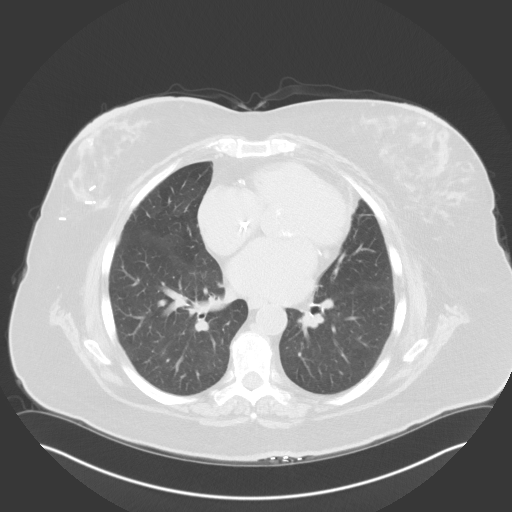
[im 104/125  lung]
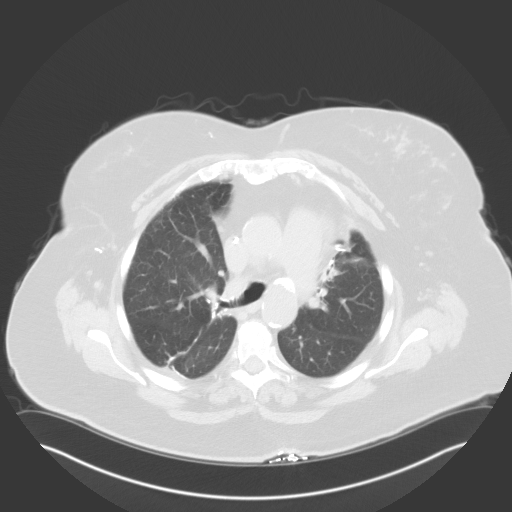
[im 114/125  lung]
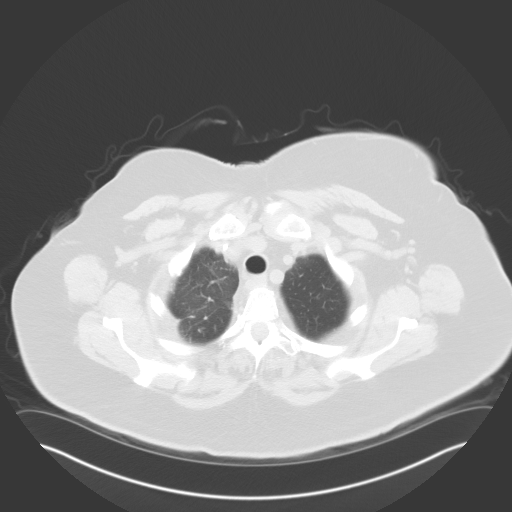

[Series 4: coronals · coronal · 0.96mm/px · 3 of 157 slices shown]
[im 32/157  lung]
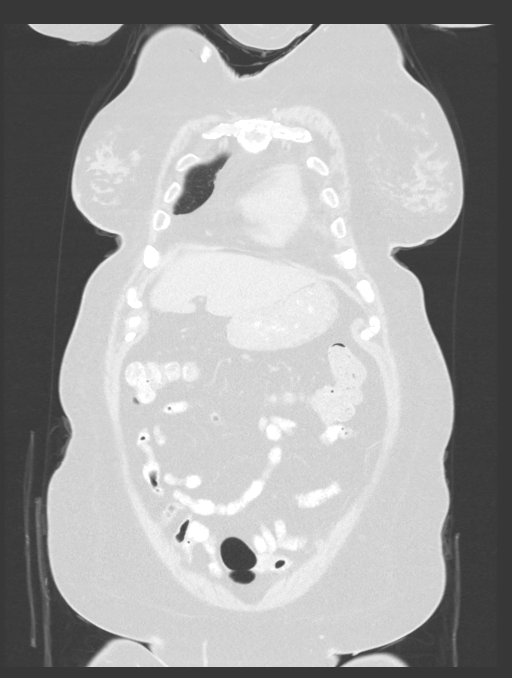
[im 63/157  lung]
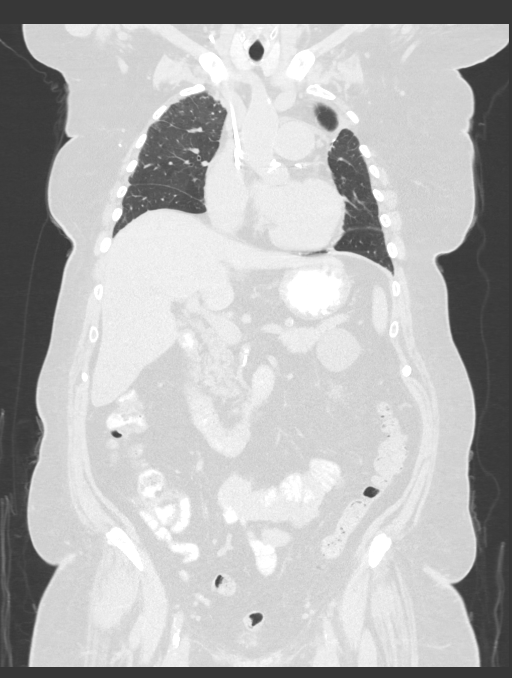
[im 94/157  lung]
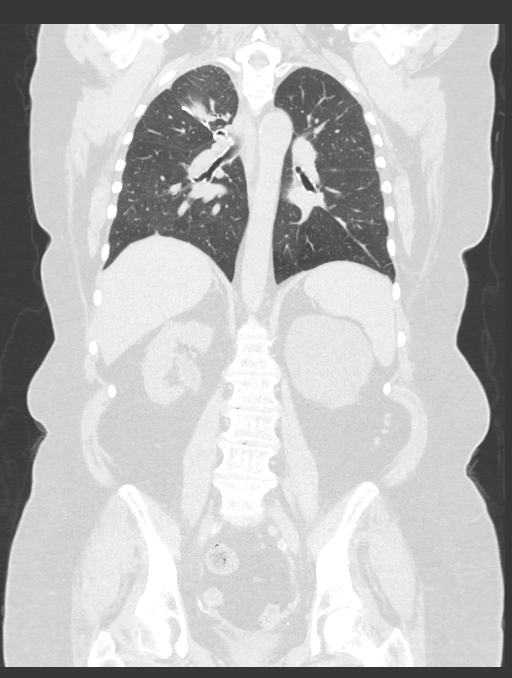

[14 of 36 positions shown; findings below may reference images not displayed]

FINDINGS: CT CHEST FINDINGS

Cardiovascular: Right Port-A-Cath tip: Right atrium. Coronary,
aortic arch, and branch vessel atherosclerotic vascular disease.
Mild cardiomegaly. Calcified aortic and mitral valves.

Mediastinum/Nodes: No pathologic adenopathy.

Lungs/Pleura: Biapical pleuroparenchymal scarring. Prior bilateral
wedge resections noted with adjacent scarring/volume loss.
Subpleural nodule in the right lower lobe measuring 0.9 by 0.7 cm on
image 97/6, primarily solid but with hazy margins, roughly stable
back through 09/14/2016.

Musculoskeletal: Thoracic spondylosis.

CT ABDOMEN PELVIS FINDINGS

Hepatobiliary: 2.2 cm gallstone in the gallbladder. At the site of
prior known tumor from the 8516 PET-CT inferiorly in segment 4 of
the liver, no definite lesion is readily apparent.

Pancreas: Unremarkable

Spleen: Unremarkable

Adrenals/Urinary Tract: Adrenal glands normal. 10 mm in long axis
right kidney lower pole nonobstructive renal calculus. 4 mm left
kidney lower pole nonobstructive renal calculus. Probable 2 mm left
kidney upper pole nonobstructive renal calculus. Similar appearance
of bilateral renal cysts, assessed without the benefit of IV
contrast. No hydronephrosis or hydroureter. No ureteral calculus.

Stomach/Bowel: Eccentric wall thickening along the medial side of
the ascending colon on image 67/4, stable, likely a site of prior
tumor.

Vascular/Lymphatic: Aortoiliac atherosclerotic vascular disease.

Reproductive: Uterus absent.  Adnexa unremarkable.

Other: No supplemental non-categorized findings.

Musculoskeletal: The inferior pubic rami were excluded. Mild lumbar
spondylosis and degenerative disc disease.
IMPRESSION: 1. Focus of mild wall thickening along the medial margin of the
ascending colon likely a site of prior tumor, not appreciably
changed from March 2018. Appearance and lack of progression
suspicious for effectively treated malignancy. No well-defined
lesion inferiorly in segment 4 of the liver at the site of the prior
hepatic metastatic lesion.
2. 8 mm right lower lobe pulmonary nodule, roughly stable back
through 09/14/2016.
3. Other imaging findings of potential clinical significance: Aortic
Atherosclerosis ([id number].V). Coronary atherosclerosis. Mild
cardiomegaly. Calcified aortic and mitral valves. Thoracolumbar
spondylosis. Cholelithiasis. Bilateral nonobstructive
nephrolithiasis. Bilateral hypodense exophytic renal lesions are
likely cysts.

## 2020-01-24 ENCOUNTER — Encounter: Payer: Self-pay | Admitting: Family

## 2020-01-24 ENCOUNTER — Ambulatory Visit (INDEPENDENT_AMBULATORY_CARE_PROVIDER_SITE_OTHER): Payer: PPO | Admitting: Family

## 2020-01-24 DIAGNOSIS — C3431 Malignant neoplasm of lower lobe, right bronchus or lung: Secondary | ICD-10-CM

## 2020-01-24 DIAGNOSIS — I10 Essential (primary) hypertension: Secondary | ICD-10-CM | POA: Diagnosis not present

## 2020-01-24 DIAGNOSIS — C189 Malignant neoplasm of colon, unspecified: Secondary | ICD-10-CM

## 2020-01-24 DIAGNOSIS — N1832 Chronic kidney disease, stage 3b: Secondary | ICD-10-CM

## 2020-01-24 DIAGNOSIS — E785 Hyperlipidemia, unspecified: Secondary | ICD-10-CM

## 2020-01-24 DIAGNOSIS — J45909 Unspecified asthma, uncomplicated: Secondary | ICD-10-CM | POA: Diagnosis not present

## 2020-01-24 NOTE — Progress Notes (Signed)
Virtual Visit via telephone Note Due to COVID-19 pandemic this visit was conducted virtually. This visit type was conducted due to national recommendations for restrictions regarding the COVID-19 Pandemic (e.g. social distancing, sheltering in place) in an effort to limit this patient's exposure and mitigate transmission in our community. All issues noted in this document were discussed and addressed.  A physical exam was not performed with this format.  I connected with Tammie [name] on 01/24/20 at 11:08 AM by telephone and verified that I am speaking with the correct person using two identifiers. Tammie [name] is currently located at home and no one is currently with her during visit. The provider, [name], FNP is located in their office at time of visit.  I discussed the limitations, risks, security and privacy concerns of performing an evaluation and management service by telephone and the availability of in person appointments. I also discussed with the patient that there may be a patient responsible charge related to this service. The patient expressed understanding and agreed to proceed.   History and Present Illness:  Pt presents to the office today for chronic follow up and INR recheck.She is followed by Oncologists every 3 months for hx of right breast cancer, right lung cancer, and Colon cancer with mets to the liver.  She was told in last week the cancer on her right lung had become larger, but they are continuing to monitor.  Hypertension This is a chronic problem. The current episode started more than 1 year ago. The problem has been resolved since onset. The problem is controlled. Associated symptoms include malaise/fatigue, peripheral edema and shortness of breath. Risk factors for coronary artery disease include dyslipidemia, obesity and sedentary lifestyle. The current treatment provides moderate improvement. Hypertensive end-organ damage includes kidney disease and CVA.   Asthma She complains of chest tightness, shortness of breath and wheezing. This is a chronic problem. The current episode started more than 1 year ago. The problem occurs intermittently. The problem has been waxing and waning. Associated symptoms include malaise/fatigue. Her past medical history is significant for asthma.  Hyperlipidemia Associated symptoms include shortness of breath.      Review of Systems  Constitutional: Positive for malaise/fatigue.  Respiratory: Positive for shortness of breath and wheezing.      Observations/Objective: Intermittent SOB, no distress noted   Assessment and Plan: [name] comes in today with chief complaint of No chief complaint on file.   Diagnosis and orders addressed:  1. Essential hypertension  2. Uncomplicated asthma, unspecified asthma severity, unspecified whether persistent  3. Primary adenocarcinoma of colon ([place])  4. Stage 3b chronic kidney disease  5. 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 ([place])  6. Hyperlipidemia with target LDL less than 100   Labs reviewed from Oncologists  Health Maintenance reviewed Diet and exercise encouraged  Follow up plan: 6 months and keep all Oncologists appts       I discussed the assessment and treatment plan with the patient. The patient was provided an opportunity to ask questions and all were answered. The patient agreed with the plan and demonstrated an understanding of the instructions.   The patient was advised to call back or seek an in-person evaluation if the symptoms worsen or if the condition fails to improve as anticipated.  The above assessment and management plan was discussed with the patient. The patient verbalized understanding of and has agreed to the management plan. Patient is aware to call the clinic if  symptoms persist or worsen. Patient is aware when to return to the clinic for a follow-up visit. Patient educated on when it is appropriate to go to the  emergency department.   Time call ended:  11:25 AM   I provided 17 minutes of non-face-to-face time during this encounter.    [name], FNP

## 2020-02-13 ENCOUNTER — Telehealth: Payer: Self-pay | Admitting: Family

## 2020-02-13 NOTE — Chronic Care Management (AMB) (Signed)
  Chronic Care Management   Note  02/13/2020 Name: EMMAJEAN RATLEDGE MRN: [id number] DOB: [birth year]  [name] is a 79 y.o. year old female who is a primary care patient of [name], FNP. I reached out to [name] by phone today in response to a referral sent by Ms. Cloyd Stagers Dresch's health plan.     Ms. Colee was given information about Chronic Care Management services today including:  1. CCM service includes personalized support from designated clinical staff supervised by her physician, including individualized plan of care and coordination with other care providers 2. 24/7 contact phone numbers for assistance for urgent and routine care needs. 3. Service will only be billed when office clinical staff spend 20 minutes or more in a month to coordinate care. 4. Only one practitioner may furnish and bill the service in a calendar month. 5. The patient may stop CCM services at any time (effective at the end of the month) by phone call to the office staff. 6. The patient will be responsible for cost sharing (co-pay) of up to 20% of the service fee (after annual deductible is met).  Patient did not agree to enrollment in care management services and does not wish to consider at this time.  Follow up plan: The patient has been provided with contact information for the care management team and has been advised to call with any health related questions or concerns.   Noreene Larsson, [place], [place], [address] [address] Direct Dial: [phone] [email or web address] Website: [email or web address]

## 2020-03-05 ENCOUNTER — Other Ambulatory Visit: Payer: Self-pay | Admitting: Family

## 2020-03-05 DIAGNOSIS — I10 Essential (primary) hypertension: Secondary | ICD-10-CM

## 2020-03-05 DIAGNOSIS — I4891 Unspecified atrial fibrillation: Secondary | ICD-10-CM

## 2020-03-05 DIAGNOSIS — E785 Hyperlipidemia, unspecified: Secondary | ICD-10-CM

## 2020-03-21 ENCOUNTER — Telehealth: Payer: Self-pay | Admitting: Emergency Medicine

## 2020-03-21 NOTE — Telephone Encounter (Signed)
Returning pt's VM requesting oral contrast for upcoming CT scan.  Pt states she will come to [facility] front desk to pick up her bag of contrast bottles/instructions today.

## 2020-03-31 ENCOUNTER — Ambulatory Visit (HOSPITAL_COMMUNITY)
Admission: RE | Admit: 2020-03-31 | Discharge: 2020-03-31 | Disposition: A | Discharge status: Home / Self Care | Payer: PPO | Source: Ambulatory Visit | Attending: Internal Medicine | Admitting: Internal Medicine

## 2020-03-31 ENCOUNTER — Inpatient Hospital Stay: Payer: PPO | Attending: Internal Medicine

## 2020-03-31 ENCOUNTER — Other Ambulatory Visit: Payer: Self-pay

## 2020-03-31 DIAGNOSIS — Z85118 Personal history of other malignant neoplasm of bronchus and lung: Secondary | ICD-10-CM | POA: Diagnosis not present

## 2020-03-31 DIAGNOSIS — C182 Malignant neoplasm of ascending colon: Secondary | ICD-10-CM | POA: Diagnosis not present

## 2020-03-31 DIAGNOSIS — N289 Disorder of kidney and ureter, unspecified: Secondary | ICD-10-CM | POA: Insufficient documentation

## 2020-03-31 DIAGNOSIS — Z853 Personal history of malignant neoplasm of breast: Secondary | ICD-10-CM | POA: Diagnosis not present

## 2020-03-31 DIAGNOSIS — C787 Secondary malignant neoplasm of liver and intrahepatic bile duct: Secondary | ICD-10-CM | POA: Insufficient documentation

## 2020-03-31 DIAGNOSIS — K8020 Calculus of gallbladder without cholecystitis without obstruction: Secondary | ICD-10-CM | POA: Diagnosis not present

## 2020-03-31 DIAGNOSIS — C3490 Malignant neoplasm of unspecified part of unspecified bronchus or lung: Secondary | ICD-10-CM | POA: Diagnosis not present

## 2020-03-31 DIAGNOSIS — M545 Low back pain: Secondary | ICD-10-CM | POA: Insufficient documentation

## 2020-03-31 DIAGNOSIS — I10 Essential (primary) hypertension: Secondary | ICD-10-CM | POA: Diagnosis not present

## 2020-03-31 LAB — CBC WITH DIFFERENTIAL (CANCER CENTER ONLY)
Abs Immature Granulocytes: 0.01 10*3/uL (ref 0.00–0.07)
Basophils Absolute: 0.1 10*3/uL (ref 0.0–0.1)
Basophils Relative: 1 %
Eosinophils Absolute: 0.3 10*3/uL (ref 0.0–0.5)
Eosinophils Relative: 4 %
HCT: 49.3 % — ABNORMAL HIGH (ref 36.0–46.0)
Hemoglobin: 15.3 g/dL — ABNORMAL HIGH (ref 12.0–15.0)
Immature Granulocytes: 0 %
Lymphocytes Relative: 18 %
Lymphs Abs: 1.1 10*3/uL (ref 0.7–4.0)
MCH: 27.9 pg (ref 26.0–34.0)
MCHC: 31 g/dL (ref 30.0–36.0)
MCV: 90 fL (ref 80.0–100.0)
Monocytes Absolute: 0.5 10*3/uL (ref 0.1–1.0)
Monocytes Relative: 8 %
Neutro Abs: 4.2 10*3/uL (ref 1.7–7.7)
Neutrophils Relative %: 69 %
Platelet Count: 159 10*3/uL (ref 150–400)
RBC: 5.48 MIL/uL — ABNORMAL HIGH (ref 3.87–5.11)
RDW: 13.6 % (ref 11.5–15.5)
WBC Count: 6.1 10*3/uL (ref 4.0–10.5)
nRBC: 0 % (ref 0.0–0.2)

## 2020-03-31 LAB — CMP (CANCER CENTER ONLY)
ALT: 12 U/L (ref 0–44)
AST: 14 U/L — ABNORMAL LOW (ref 15–41)
Albumin: 3.8 g/dL (ref 3.5–5.0)
Alkaline Phosphatase: 52 U/L (ref 38–126)
Anion gap: 11 (ref 5–15)
BUN: 25 mg/dL — ABNORMAL HIGH (ref 8–23)
CO2: 30 mmol/L (ref 22–32)
Calcium: 9.3 mg/dL (ref 8.9–10.3)
Chloride: 104 mmol/L (ref 98–111)
Creatinine: 1.7 mg/dL — ABNORMAL HIGH (ref 0.44–1.00)
GFR, Est AFR Am: 33 mL/min — ABNORMAL LOW (ref >60)
GFR, Estimated: 28 mL/min — ABNORMAL LOW (ref >60)
Glucose, Bld: 90 mg/dL (ref 70–99)
Potassium: 4.9 mmol/L (ref 3.5–5.1)
Sodium: 145 mmol/L (ref 135–145)
Total Bilirubin: 0.6 mg/dL (ref 0.3–1.2)
Total Protein: 7.4 g/dL (ref 6.5–8.1)

## 2020-03-31 LAB — CEA (IN HOUSE-CHCC): CEA (CHCC-In House): 1.99 ng/mL (ref 0.00–5.00)

## 2020-04-02 ENCOUNTER — Encounter: Payer: Self-pay | Admitting: Internal Medicine

## 2020-04-02 ENCOUNTER — Inpatient Hospital Stay (HOSPITAL_BASED_OUTPATIENT_CLINIC_OR_DEPARTMENT_OTHER): Payer: PPO | Admitting: Internal Medicine

## 2020-04-02 ENCOUNTER — Telehealth: Payer: Self-pay | Admitting: Internal Medicine

## 2020-04-02 ENCOUNTER — Other Ambulatory Visit: Payer: Self-pay

## 2020-04-02 VITALS — BP 154/72 | HR 61 | Temp 98.3°F | Resp 21 | Ht 71.0 in | Wt 277.4 lb

## 2020-04-02 DIAGNOSIS — C182 Malignant neoplasm of ascending colon: Secondary | ICD-10-CM | POA: Diagnosis not present

## 2020-04-02 DIAGNOSIS — C189 Malignant neoplasm of colon, unspecified: Secondary | ICD-10-CM | POA: Diagnosis not present

## 2020-04-02 DIAGNOSIS — C3431 Malignant neoplasm of lower lobe, right bronchus or lung: Secondary | ICD-10-CM

## 2020-04-02 DIAGNOSIS — C3490 Malignant neoplasm of unspecified part of unspecified bronchus or lung: Secondary | ICD-10-CM | POA: Diagnosis not present

## 2020-04-02 DIAGNOSIS — Z95828 Presence of other vascular implants and grafts: Secondary | ICD-10-CM | POA: Diagnosis not present

## 2020-04-02 DIAGNOSIS — I10 Essential (primary) hypertension: Secondary | ICD-10-CM | POA: Diagnosis not present

## 2020-04-02 MED ORDER — SODIUM CHLORIDE 0.9% FLUSH
10.0000 mL | INTRAVENOUS | Status: DC | PRN
  Administered 2020-04-02: 10 mL
  Filled 2020-04-02: qty 10

## 2020-04-02 MED ORDER — HEPARIN SOD (PORK) LOCK FLUSH 100 UNIT/ML IV SOLN
500.0000 [IU] | Freq: Once | INTRAVENOUS | Status: AC | PRN
  Administered 2020-04-02: 500 [IU]
  Filled 2020-04-02: qty 5

## 2020-04-02 NOTE — Telephone Encounter (Signed)
Scheduled appt per 4/7 los.  Patient did  Not want to schedule port flush every two months, due to where she live.  She stated she will get her port flush when its time to get her CT scan  Printed calendar and avs
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
## 2020-04-02 NOTE — Progress Notes (Signed)
Port flush appts - Pt declined port flushes every 6-8 weeks. She wants to wait until appt in August.

## 2020-04-13 IMAGING — CT CT CHEST W/O CM
2 of 4 series · 13 of 36 positions shown, 16 images · non-contrast
Comparison: 06/30/2018.

CLINICAL DATA: Right breast cancer and history of non-small-cell
lung cancer status post left upper lobectomy and wedge resection
metastatic disease to the right lung. Also with a history of colon
cancer metastatic to the liver in [id number].

EXAM:
CT CHEST, ABDOMEN AND PELVIS WITHOUT CONTRAST
TECHNIQUE: Multidetector CT imaging of the chest, abdomen and pelvis was
performed following the standard protocol without IV contrast.

[Series 2: cap w/o · axial · non-contrast · 0.87mm/px · z∈[-680,-130]mm · 10 of 132 slices shown, 13 images]
[im 11/132  mediastinal]
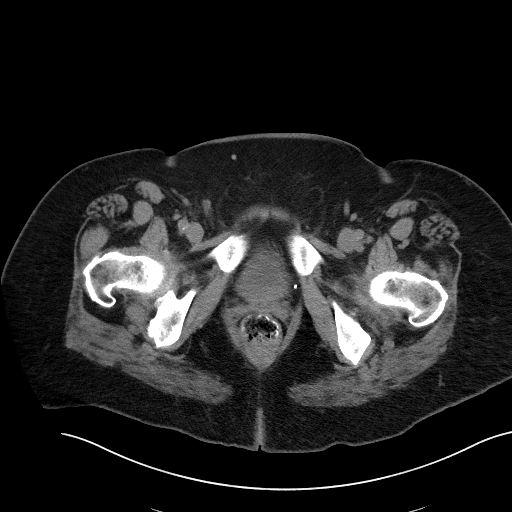
[im 11/132  lung]
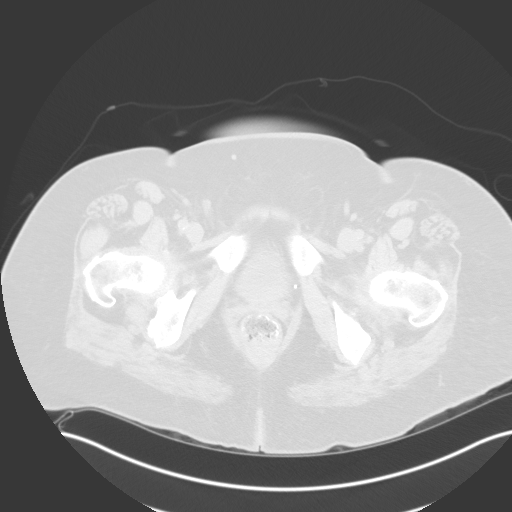
[im 21/132  lung]
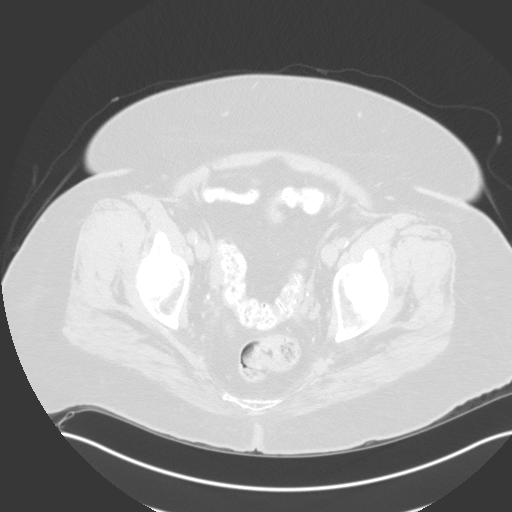
[im 41/132  lung]
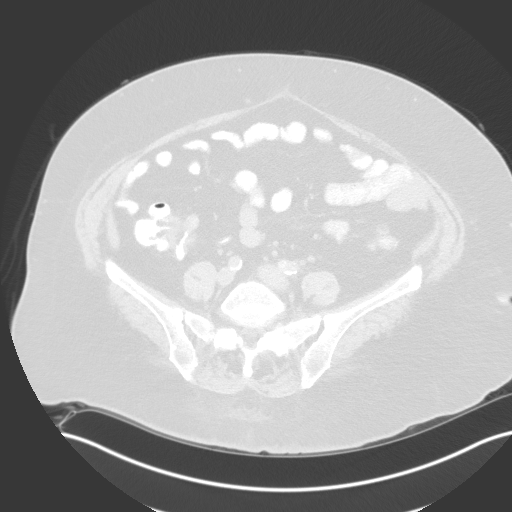
[im 51/132  lung]
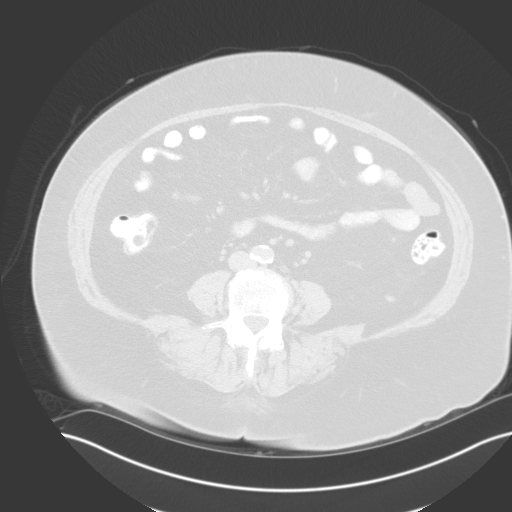
[im 61/132  mediastinal]
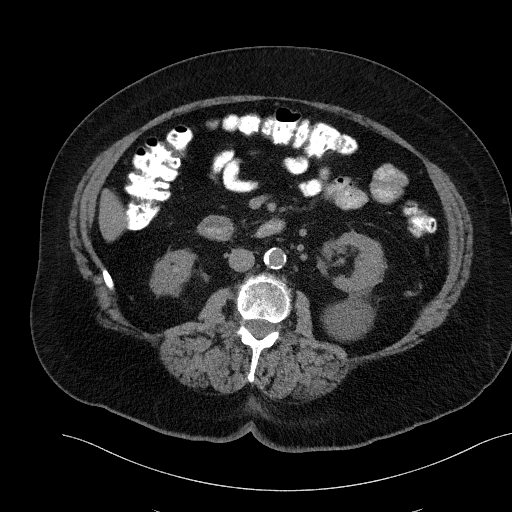
[im 61/132  lung]
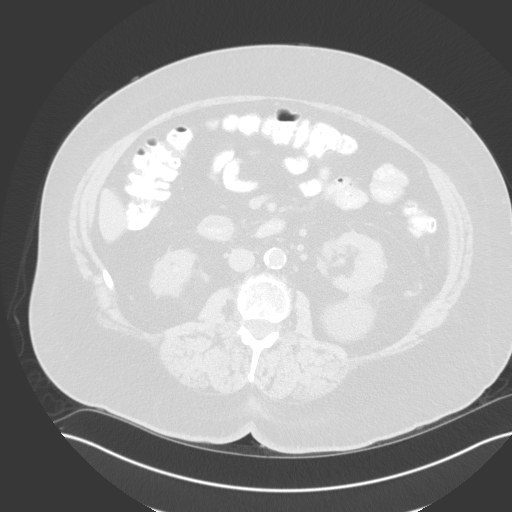
[im 71/132  lung]
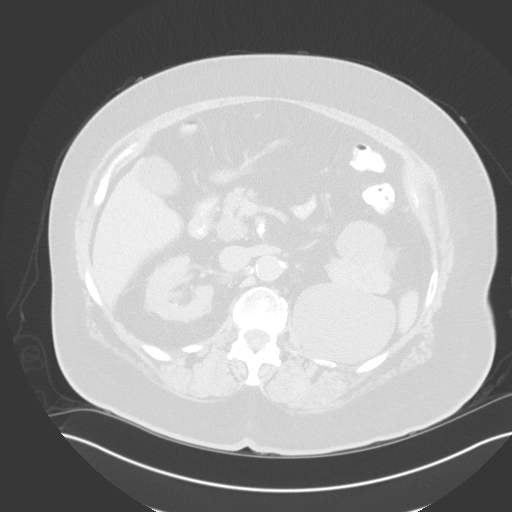
[im 81/132  lung]
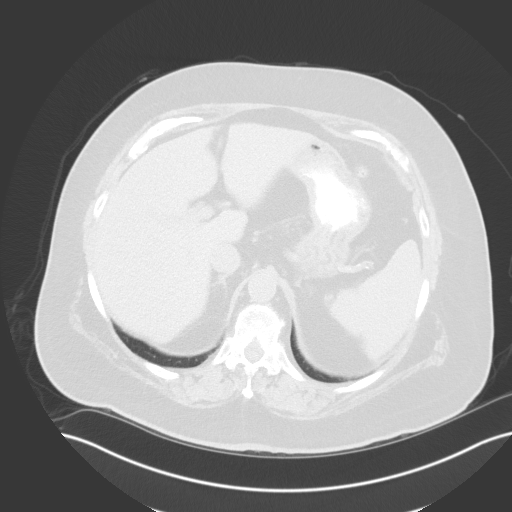
[im 101/132  lung]
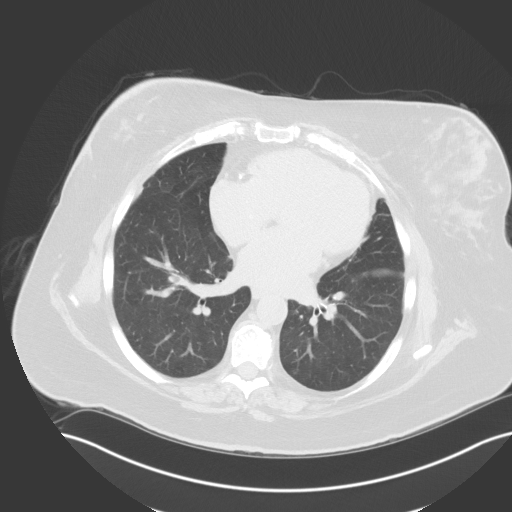
[im 111/132  mediastinal]
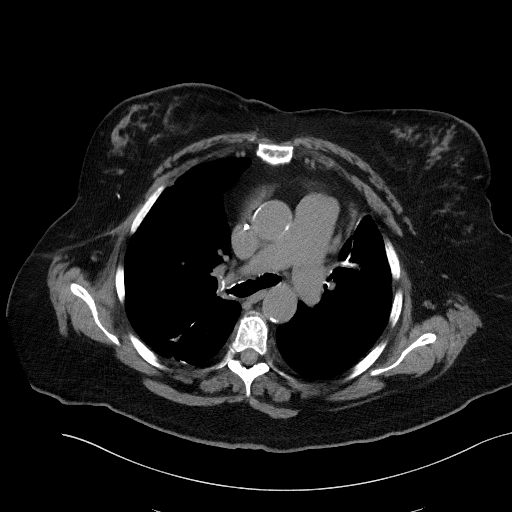
[im 111/132  lung]
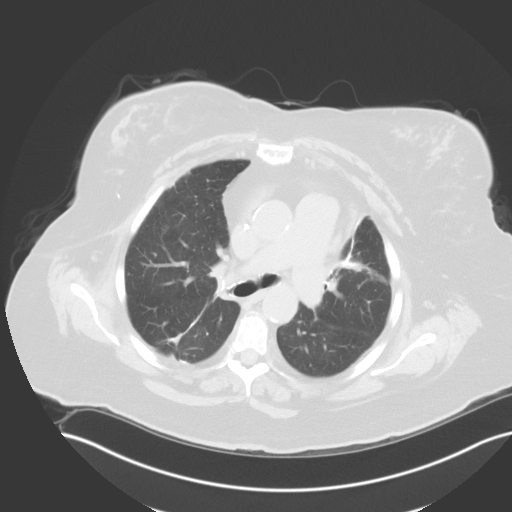
[im 121/132  lung]
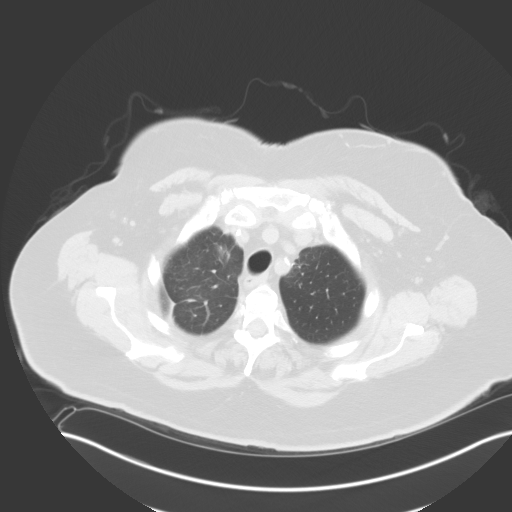

[Series 5: coronals · coronal · 0.82mm/px · 3 of 175 slices shown]
[im 35/175  lung]
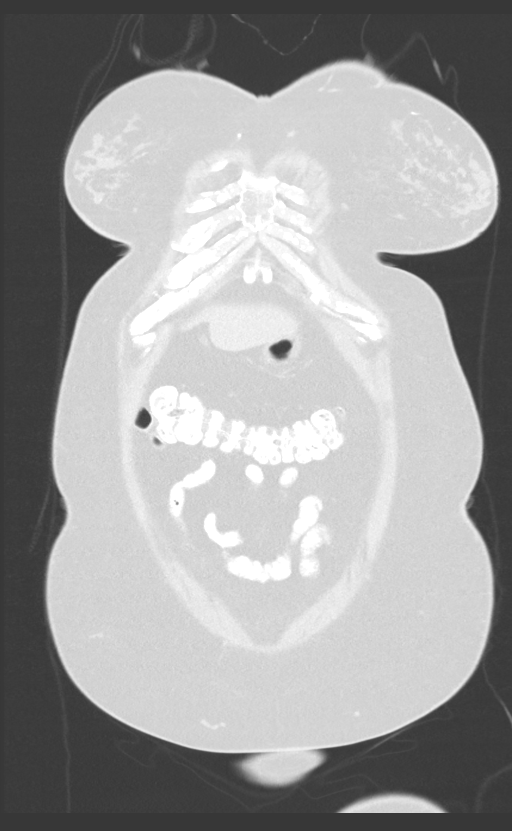
[im 70/175  lung]
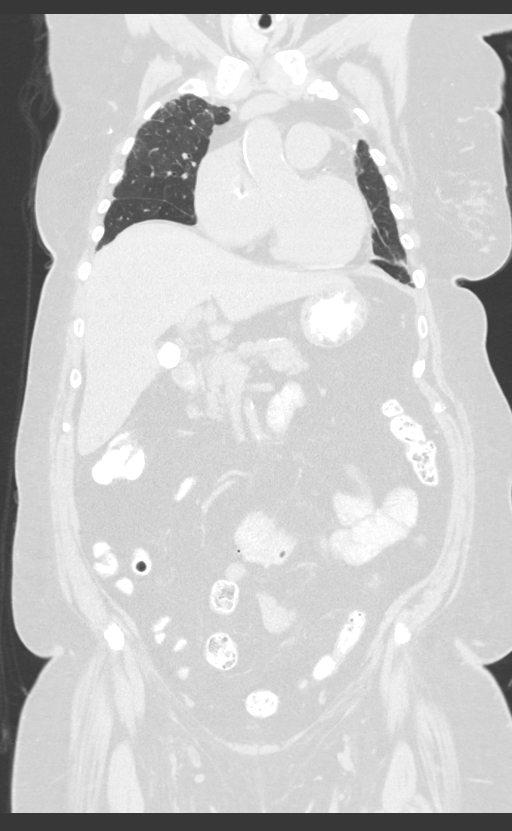
[im 105/175  lung]
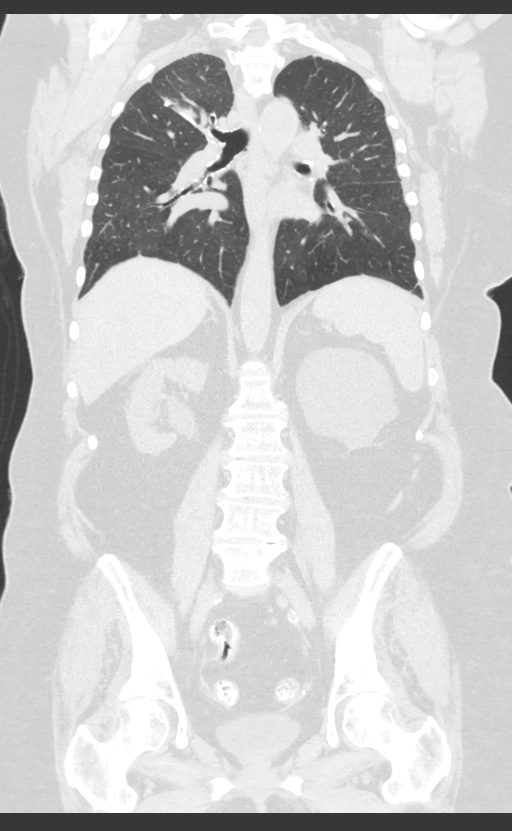

[13 of 36 positions shown; findings below may reference images not displayed]

FINDINGS: CT CHEST FINDINGS

Cardiovascular: Heart size upper normal. Coronary artery
calcification is evident. Atherosclerotic calcification is noted in
the wall of the thoracic aorta. Right Port-A-Cath tip is positioned
in the upper right atrium.

Mediastinum/Nodes: No mediastinal lymphadenopathy. No evidence for
gross hilar lymphadenopathy although assessment is limited by the
lack of intravenous contrast on today's study. The esophagus has
normal imaging features. There is no axillary lymphadenopathy.

Lungs/Pleura: The central tracheobronchial airways are patent.
Suprahilar right lung staple lines and scarring with similar
appearance to prior. 7 mm posterior right lower lobe nodule (95/4)
is stable. Postsurgical change medial left upper lung is similar to
prior. No new or progressive finding in either lung.

Musculoskeletal: No worrisome lytic or sclerotic osseous
abnormality.

CT ABDOMEN PELVIS FINDINGS

Hepatobiliary: No focal abnormality in the liver on this study
without intravenous contrast. 2.3 cm calcified gallstone evident. No
intrahepatic or extrahepatic biliary dilation.

Pancreas: No focal mass lesion. No dilatation of the main duct. No
intraparenchymal cyst. No peripancreatic edema.

Spleen: No splenomegaly. No focal mass lesion.

Adrenals/Urinary Tract: No adrenal nodule or mass. 8 mm stone in the
lower pole right kidney is stable. Multiple stones noted left
kidney. Multiple left renal cysts are similar, measuring up to
cm maximum diameter. No evidence for hydroureter. The urinary
bladder appears normal for the degree of distention.

Stomach/Bowel: Stomach is nondistended. No gastric wall thickening.
No evidence of outlet obstruction. Duodenum is normally positioned
as is the ligament of Treitz. No small bowel wall thickening. No
small bowel dilatation. The terminal ileum is normal. The appendix
is normal. No gross colonic mass. The previously described eccentric
colonic wall thickening seen medially in the ascending colon is
stable (78/5).. No substantial diverticular change.

Vascular/Lymphatic: There is abdominal aortic atherosclerosis
without aneurysm. There is no gastrohepatic or hepatoduodenal
ligament lymphadenopathy. No intraperitoneal or retroperitoneal
lymphadenopathy. No pelvic sidewall lymphadenopathy.

Reproductive: Uterus surgically absent.  There is no adnexal mass.

Other: No intraperitoneal free fluid.

Musculoskeletal: No worrisome lytic or sclerotic osseous
abnormality.
IMPRESSION: 1. Post treatment scarring in both lungs without new or progressive
findings in the chest. 7 mm posterior right lower lobe pulmonary
nodule is stable in the interval.
2. Previously described small focal area centric wall thickening
medial ascending colon is unchanged.
3. Cholelithiasis
4. Bilateral nephrolithiasis without urinary obstruction.
5.  Aortic Atherosclerois ([id number])

## 2020-06-03 ENCOUNTER — Other Ambulatory Visit: Payer: Self-pay | Admitting: Family

## 2020-06-03 DIAGNOSIS — I4891 Unspecified atrial fibrillation: Secondary | ICD-10-CM

## 2020-06-03 DIAGNOSIS — I10 Essential (primary) hypertension: Secondary | ICD-10-CM

## 2020-06-03 DIAGNOSIS — E785 Hyperlipidemia, unspecified: Secondary | ICD-10-CM

## 2020-07-06 IMAGING — CT CT CHEST W/O CM
2 of 4 series · 14 of 36 positions shown, 17 images · non-contrast
Comparison: 09/26/2018

CLINICAL DATA: Metastatic colon cancer with liver metastases. Stage
III non-small lung cancer in the right upper and lower lobes.

EXAM:
CT CHEST, ABDOMEN AND PELVIS WITHOUT CONTRAST
TECHNIQUE: Multidetector CT imaging of the chest, abdomen and pelvis was
performed following the standard protocol without IV contrast.

[Series 2: cap w/o · axial · non-contrast · 0.92mm/px · z∈[-356,+214]mm · 11 of 136 slices shown, 14 images]
[im 11/136  mediastinal]
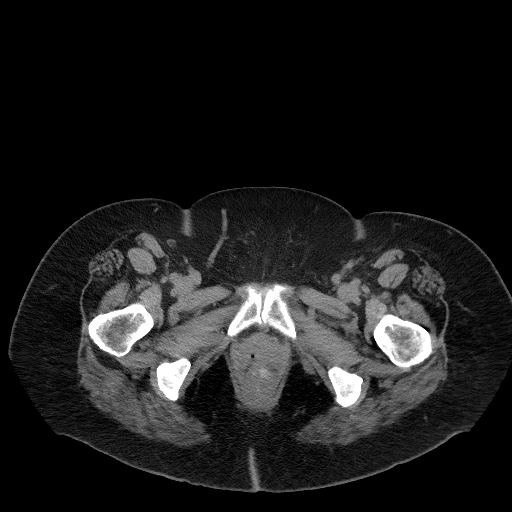
[im 11/136  lung]
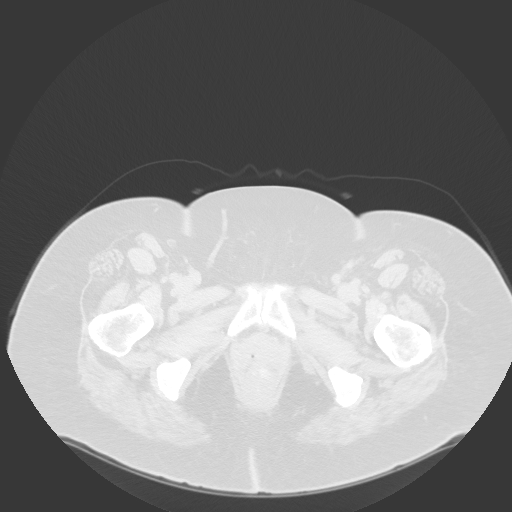
[im 21/136  lung]
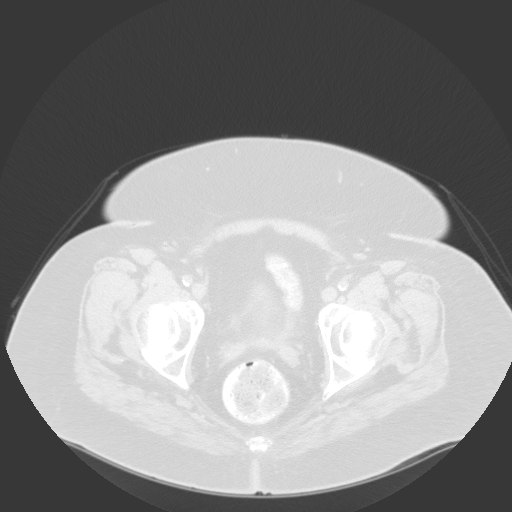
[im 32/136  lung]
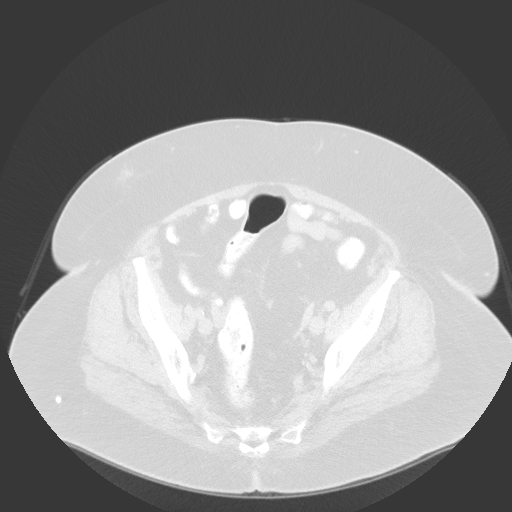
[im 42/136  lung]
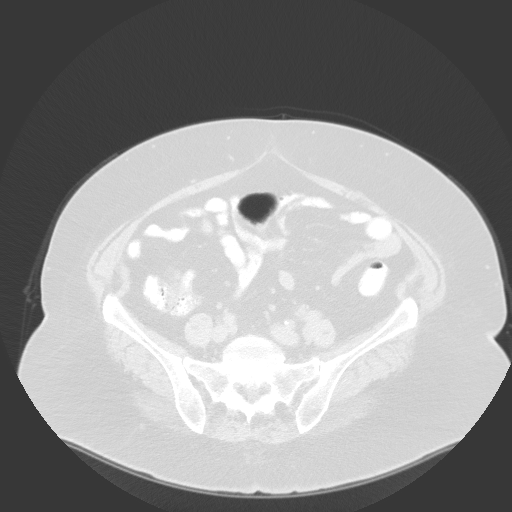
[im 52/136  mediastinal]
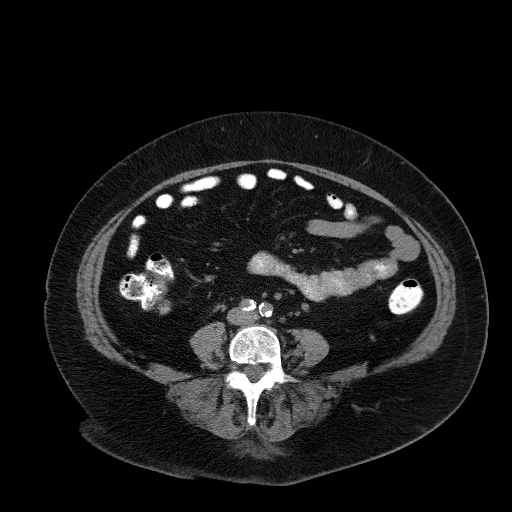
[im 52/136  lung]
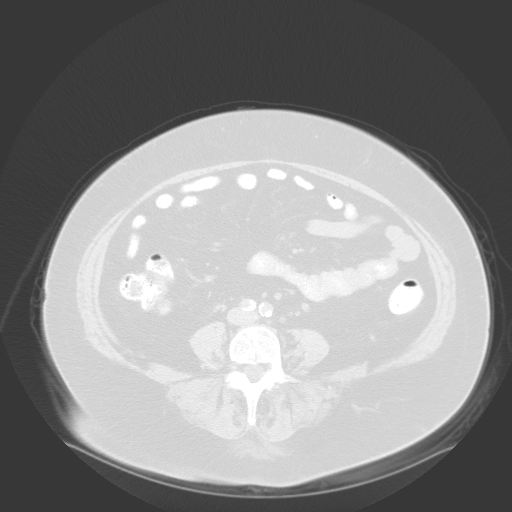
[im 73/136  lung]
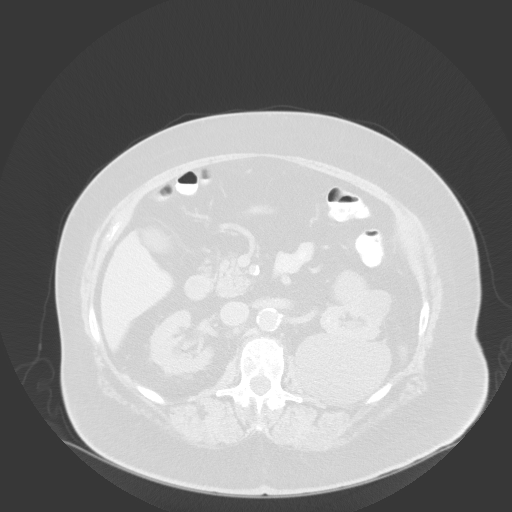
[im 84/136  lung]
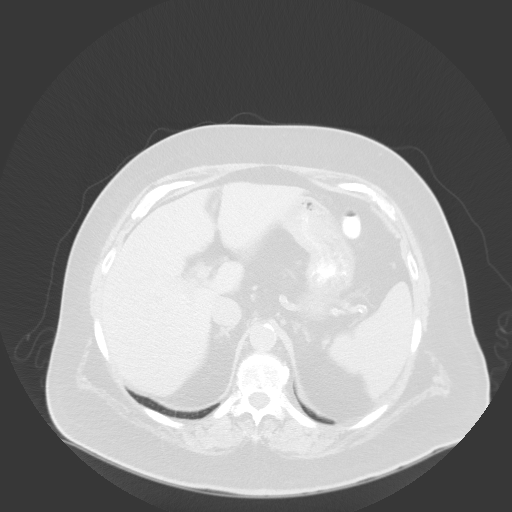
[im 94/136  lung]
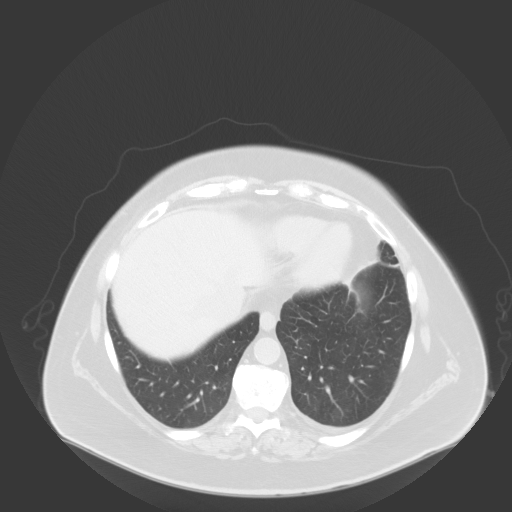
[im 104/136  mediastinal]
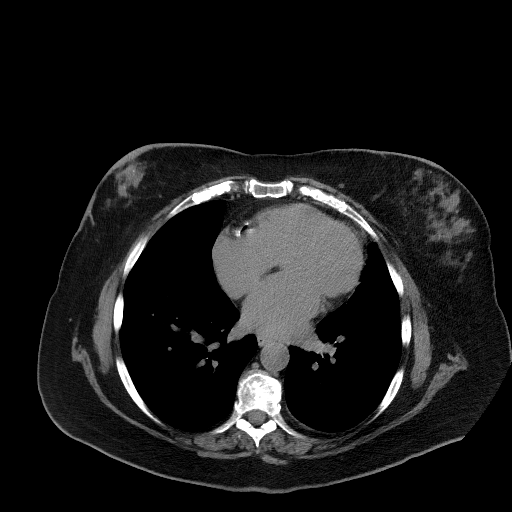
[im 104/136  lung]
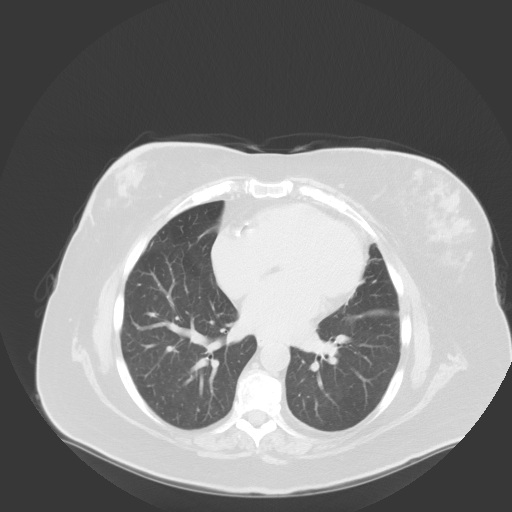
[im 115/136  lung]
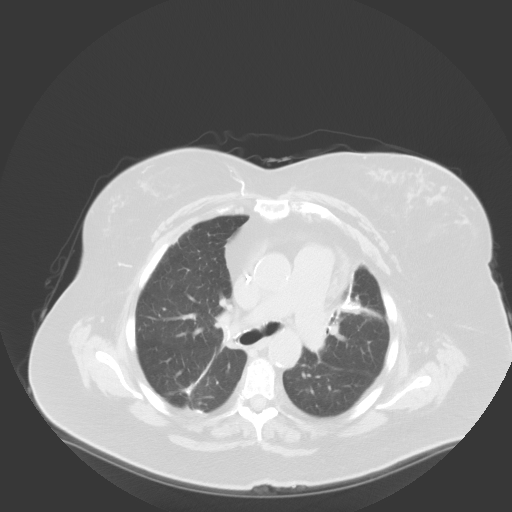
[im 125/136  lung]
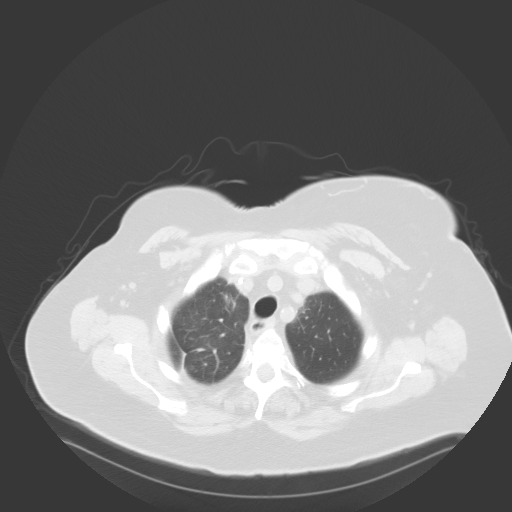

[Series 4: coronals · coronal · 1.20mm/px · 3 of 149 slices shown]
[im 30/149  lung]
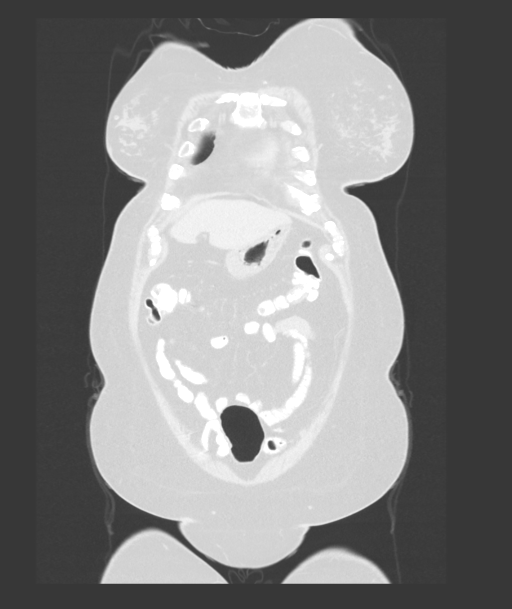
[im 60/149  lung]
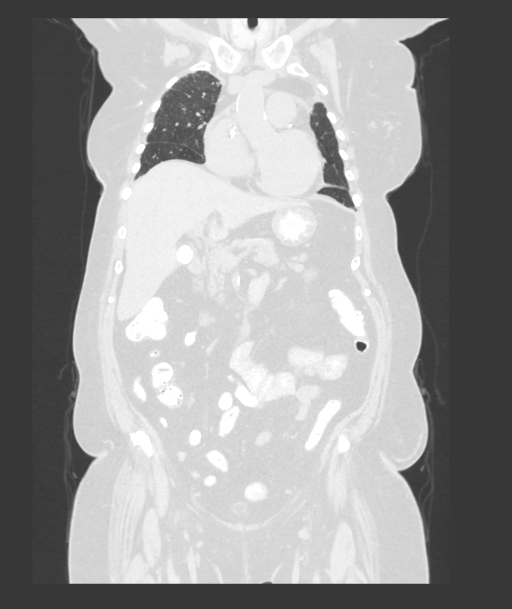
[im 89/149  lung]
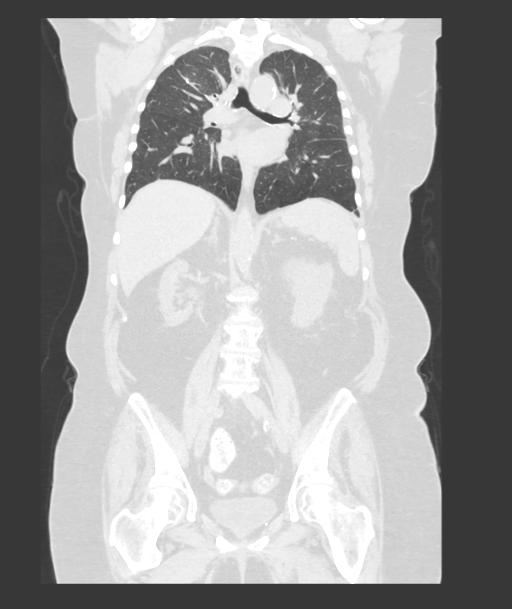

[14 of 36 positions shown; findings below may reference images not displayed]

FINDINGS: CT CHEST FINDINGS

Cardiovascular: The heart is top-normal in size. No pericardial
effusion.

No evidence of vascular can your is impaired atherosclerotic
calcifications of the aortic arch.

Three vessel coronary atherosclerosis.

Right chest port terminates at the cavoatrial junction.

Mediastinum/Nodes: No suspicious mediastinal lymphadenopathy. Status
post right axillary lymph node dissection.

Visualized thyroid is unremarkable.

Lungs/Pleura: Status post right upper and right lower lobe wedge
resection. Status post left upper lobe wedge resection.

8 mm subpleural nodule in the posterior right lower lobe (series
6/image 87), previously 7 mm.

Mild biapical pleural-parenchymal scarring.  No focal consolidation.

Trace pleural fluid/thickening in the anterior/posterior left
hemithorax and posterior right hemithorax. No pneumothorax.

Musculoskeletal: Status post right breast lumpectomy.

Mild degenerative changes of the lower thoracic spine.

CT ABDOMEN PELVIS FINDINGS

Hepatobiliary: Liver is within normal limits.

2.3 cm gallstone, without associated inflammatory changes. No
intrahepatic or extrahepatic ductal dilatation.

Pancreas: Within normal limits.

Spleen: Within normal limits.

Adrenals/Urinary Tract: Adrenal glands are within normal limits.

Bilateral renal cysts, including a dominant 9.0 cm cyst in the
posterior left upper kidney (series 2/image 61).

7 mm calculus in the right proximal collecting system with
surrounding urothelial thickening (series 2/image 69). 6 mm
nonobstructing calculus in the right lower pole (series 2/image 71).
3 mm nonobstructing calculus in the left lower pole (series 2/image
76). No ureteral or bladder calculi. No hydronephrosis.

Bladder is within normal limits.

Stomach/Bowel: Stomach is within normal limits.

No evidence of bowel obstruction.

Normal appendix (series 2/image 92).

No colonic wall thickening or mass is evident on CT.

Vascular/Lymphatic: No evidence of abdominal aortic aneurysm.

Atherosclerotic calcifications of the abdominal aorta and branch
vessels.

No suspicious abdominopelvic lymphadenopathy.

Reproductive: Status post hysterectomy.

Bilateral ovaries are within normal limits.

Other: No abdominopelvic ascites.

Musculoskeletal: Mild degenerative changes of the lumbar spine.
IMPRESSION: Status post right upper lobe, left upper lobe, and right lower lobe
wedge resection. 8 mm subpleural nodule in the posterior right lower
lobe is minimally prominent when compared to the prior. Continued
attention on follow-up is suggested. Otherwise, no findings specific
for recurrent or metastatic disease.

Status post right breast lumpectomy with right axillary lymph node
dissection.

No colonic wall thickening or mass evident CT.

7 mm calculus in the right proximal collecting system with mild
surrounding urothelial thickening. Additional bilateral
nonobstructing renal calculi, as above. No frank hydronephrosis.

Additional stable ancillary findings as above.

## 2020-08-01 ENCOUNTER — Other Ambulatory Visit: Payer: Self-pay

## 2020-08-01 ENCOUNTER — Inpatient Hospital Stay: Payer: PPO | Attending: Internal Medicine

## 2020-08-01 ENCOUNTER — Ambulatory Visit (HOSPITAL_COMMUNITY)
Admission: RE | Admit: 2020-08-01 | Discharge: 2020-08-01 | Disposition: A | Discharge status: Home / Self Care | Payer: PPO | Source: Ambulatory Visit | Attending: Internal Medicine | Admitting: Internal Medicine

## 2020-08-01 ENCOUNTER — Inpatient Hospital Stay: Payer: PPO

## 2020-08-01 DIAGNOSIS — Z85038 Personal history of other malignant neoplasm of large intestine: Secondary | ICD-10-CM | POA: Diagnosis not present

## 2020-08-01 DIAGNOSIS — Z85118 Personal history of other malignant neoplasm of bronchus and lung: Secondary | ICD-10-CM | POA: Insufficient documentation

## 2020-08-01 DIAGNOSIS — I700 Atherosclerosis of aorta: Secondary | ICD-10-CM | POA: Diagnosis not present

## 2020-08-01 DIAGNOSIS — C787 Secondary malignant neoplasm of liver and intrahepatic bile duct: Secondary | ICD-10-CM | POA: Insufficient documentation

## 2020-08-01 DIAGNOSIS — I10 Essential (primary) hypertension: Secondary | ICD-10-CM | POA: Diagnosis not present

## 2020-08-01 DIAGNOSIS — N200 Calculus of kidney: Secondary | ICD-10-CM | POA: Insufficient documentation

## 2020-08-01 DIAGNOSIS — C3490 Malignant neoplasm of unspecified part of unspecified bronchus or lung: Secondary | ICD-10-CM

## 2020-08-01 DIAGNOSIS — Z95828 Presence of other vascular implants and grafts: Secondary | ICD-10-CM

## 2020-08-01 DIAGNOSIS — Z8673 Personal history of transient ischemic attack (TIA), and cerebral infarction without residual deficits: Secondary | ICD-10-CM | POA: Diagnosis not present

## 2020-08-01 DIAGNOSIS — I358 Other nonrheumatic aortic valve disorders: Secondary | ICD-10-CM | POA: Diagnosis not present

## 2020-08-01 DIAGNOSIS — K8020 Calculus of gallbladder without cholecystitis without obstruction: Secondary | ICD-10-CM | POA: Diagnosis not present

## 2020-08-01 DIAGNOSIS — C182 Malignant neoplasm of ascending colon: Secondary | ICD-10-CM | POA: Diagnosis not present

## 2020-08-01 DIAGNOSIS — I2510 Atherosclerotic heart disease of native coronary artery without angina pectoris: Secondary | ICD-10-CM | POA: Diagnosis not present

## 2020-08-01 DIAGNOSIS — Z853 Personal history of malignant neoplasm of breast: Secondary | ICD-10-CM | POA: Insufficient documentation

## 2020-08-01 LAB — CMP (CANCER CENTER ONLY)
ALT: 9 U/L (ref 0–44)
AST: 12 U/L — ABNORMAL LOW (ref 15–41)
Albumin: 3.8 g/dL (ref 3.5–5.0)
Alkaline Phosphatase: 52 U/L (ref 38–126)
Anion gap: 8 (ref 5–15)
BUN: 30 mg/dL — ABNORMAL HIGH (ref 8–23)
CO2: 26 mmol/L (ref 22–32)
Calcium: 9.6 mg/dL (ref 8.9–10.3)
Chloride: 107 mmol/L (ref 98–111)
Creatinine: 1.63 mg/dL — ABNORMAL HIGH (ref 0.44–1.00)
GFR, Est AFR Am: 34 mL/min — ABNORMAL LOW (ref >60)
GFR, Estimated: 30 mL/min — ABNORMAL LOW (ref >60)
Glucose, Bld: 99 mg/dL (ref 70–99)
Potassium: 5.1 mmol/L (ref 3.5–5.1)
Sodium: 141 mmol/L (ref 135–145)
Total Bilirubin: 0.6 mg/dL (ref 0.3–1.2)
Total Protein: 7 g/dL (ref 6.5–8.1)

## 2020-08-01 LAB — CBC WITH DIFFERENTIAL (CANCER CENTER ONLY)
Abs Immature Granulocytes: 0.03 10*3/uL (ref 0.00–0.07)
Basophils Absolute: 0.1 10*3/uL (ref 0.0–0.1)
Basophils Relative: 1 %
Eosinophils Absolute: 0.3 10*3/uL (ref 0.0–0.5)
Eosinophils Relative: 4 %
HCT: 48.4 % — ABNORMAL HIGH (ref 36.0–46.0)
Hemoglobin: 15 g/dL (ref 12.0–15.0)
Immature Granulocytes: 0 %
Lymphocytes Relative: 16 %
Lymphs Abs: 1.2 10*3/uL (ref 0.7–4.0)
MCH: 27 pg (ref 26.0–34.0)
MCHC: 31 g/dL (ref 30.0–36.0)
MCV: 87.2 fL (ref 80.0–100.0)
Monocytes Absolute: 0.6 10*3/uL (ref 0.1–1.0)
Monocytes Relative: 8 %
Neutro Abs: 5.1 10*3/uL (ref 1.7–7.7)
Neutrophils Relative %: 71 %
Platelet Count: 159 10*3/uL (ref 150–400)
RBC: 5.55 MIL/uL — ABNORMAL HIGH (ref 3.87–5.11)
RDW: 14 % (ref 11.5–15.5)
WBC Count: 7.2 10*3/uL (ref 4.0–10.5)
nRBC: 0 % (ref 0.0–0.2)

## 2020-08-01 LAB — CEA (IN HOUSE-CHCC): CEA (CHCC-In House): 2 ng/mL (ref 0.00–5.00)

## 2020-08-01 MED ORDER — SODIUM CHLORIDE 0.9% FLUSH
10.0000 mL | INTRAVENOUS | Status: DC | PRN
  Administered 2020-08-01: 10 mL
  Filled 2020-08-01: qty 10

## 2020-08-01 MED ORDER — HEPARIN SOD (PORK) LOCK FLUSH 100 UNIT/ML IV SOLN
500.0000 [IU] | Freq: Once | INTRAVENOUS | Status: AC | PRN
  Administered 2020-08-01: 500 [IU]
  Filled 2020-08-01: qty 5

## 2020-08-04 ENCOUNTER — Other Ambulatory Visit: Payer: Self-pay

## 2020-08-04 ENCOUNTER — Telehealth: Payer: Self-pay | Admitting: Internal Medicine

## 2020-08-04 ENCOUNTER — Inpatient Hospital Stay: Payer: PPO | Admitting: Internal Medicine

## 2020-08-04 ENCOUNTER — Encounter: Payer: Self-pay | Admitting: Internal Medicine

## 2020-08-04 VITALS — BP 159/78 | HR 57 | Temp 98.5°F | Resp 20 | Ht 71.0 in | Wt 279.4 lb

## 2020-08-04 DIAGNOSIS — C3431 Malignant neoplasm of lower lobe, right bronchus or lung: Secondary | ICD-10-CM | POA: Diagnosis not present

## 2020-08-04 DIAGNOSIS — I10 Essential (primary) hypertension: Secondary | ICD-10-CM

## 2020-08-04 DIAGNOSIS — C182 Malignant neoplasm of ascending colon: Secondary | ICD-10-CM | POA: Diagnosis not present

## 2020-08-04 DIAGNOSIS — C229 Malignant neoplasm of liver, not specified as primary or secondary: Secondary | ICD-10-CM

## 2020-08-04 DIAGNOSIS — C3490 Malignant neoplasm of unspecified part of unspecified bronchus or lung: Secondary | ICD-10-CM | POA: Diagnosis not present

## 2020-08-04 NOTE — Telephone Encounter (Signed)
Scheduled per 8/9 los. Printed avs and calendar for pt.
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
## 2020-08-27 ENCOUNTER — Other Ambulatory Visit: Payer: Self-pay | Admitting: Family

## 2020-08-27 DIAGNOSIS — I4891 Unspecified atrial fibrillation: Secondary | ICD-10-CM

## 2020-08-27 DIAGNOSIS — E785 Hyperlipidemia, unspecified: Secondary | ICD-10-CM

## 2020-08-27 DIAGNOSIS — I10 Essential (primary) hypertension: Secondary | ICD-10-CM

## 2020-09-06 ENCOUNTER — Other Ambulatory Visit: Payer: Self-pay | Admitting: Family

## 2020-09-06 DIAGNOSIS — I4891 Unspecified atrial fibrillation: Secondary | ICD-10-CM

## 2020-09-06 DIAGNOSIS — E785 Hyperlipidemia, unspecified: Secondary | ICD-10-CM

## 2020-09-06 DIAGNOSIS — I10 Essential (primary) hypertension: Secondary | ICD-10-CM

## 2020-09-13 ENCOUNTER — Other Ambulatory Visit: Payer: Self-pay

## 2020-09-13 ENCOUNTER — Inpatient Hospital Stay (HOSPITAL_COMMUNITY)
Admission: EM | Admit: 2020-09-13 | Discharge: 2020-09-16 | DRG: 291 | Disposition: A | Discharge status: Home / Self Care | Payer: PPO | Attending: Internal Medicine | Admitting: Internal Medicine

## 2020-09-13 ENCOUNTER — Emergency Department (HOSPITAL_COMMUNITY): Payer: PPO

## 2020-09-13 DIAGNOSIS — C189 Malignant neoplasm of colon, unspecified: Secondary | ICD-10-CM | POA: Diagnosis not present

## 2020-09-13 DIAGNOSIS — Z6839 Body mass index (BMI) 39.0-39.9, adult: Secondary | ICD-10-CM | POA: Diagnosis not present

## 2020-09-13 DIAGNOSIS — Z20822 Contact with and (suspected) exposure to covid-19: Secondary | ICD-10-CM | POA: Diagnosis present

## 2020-09-13 DIAGNOSIS — Z8249 Family history of ischemic heart disease and other diseases of the circulatory system: Secondary | ICD-10-CM | POA: Diagnosis not present

## 2020-09-13 DIAGNOSIS — I5032 Chronic diastolic (congestive) heart failure: Secondary | ICD-10-CM | POA: Diagnosis present

## 2020-09-13 DIAGNOSIS — Z85118 Personal history of other malignant neoplasm of bronchus and lung: Secondary | ICD-10-CM

## 2020-09-13 DIAGNOSIS — J4489 Other specified chronic obstructive pulmonary disease: Secondary | ICD-10-CM | POA: Diagnosis present

## 2020-09-13 DIAGNOSIS — N1832 Chronic kidney disease, stage 3b: Secondary | ICD-10-CM | POA: Diagnosis not present

## 2020-09-13 DIAGNOSIS — J302 Other seasonal allergic rhinitis: Secondary | ICD-10-CM | POA: Diagnosis not present

## 2020-09-13 DIAGNOSIS — Z87891 Personal history of nicotine dependence: Secondary | ICD-10-CM

## 2020-09-13 DIAGNOSIS — C3431 Malignant neoplasm of lower lobe, right bronchus or lung: Secondary | ICD-10-CM | POA: Diagnosis not present

## 2020-09-13 DIAGNOSIS — J449 Chronic obstructive pulmonary disease, unspecified: Secondary | ICD-10-CM | POA: Diagnosis not present

## 2020-09-13 DIAGNOSIS — Z85038 Personal history of other malignant neoplasm of large intestine: Secondary | ICD-10-CM

## 2020-09-13 DIAGNOSIS — Z7901 Long term (current) use of anticoagulants: Secondary | ICD-10-CM | POA: Diagnosis not present

## 2020-09-13 DIAGNOSIS — Z79899 Other long term (current) drug therapy: Secondary | ICD-10-CM | POA: Diagnosis not present

## 2020-09-13 DIAGNOSIS — I130 Hypertensive heart and chronic kidney disease with heart failure and stage 1 through stage 4 chronic kidney disease, or unspecified chronic kidney disease: Secondary | ICD-10-CM | POA: Diagnosis not present

## 2020-09-13 DIAGNOSIS — Z8673 Personal history of transient ischemic attack (TIA), and cerebral infarction without residual deficits: Secondary | ICD-10-CM

## 2020-09-13 DIAGNOSIS — I5033 Acute on chronic diastolic (congestive) heart failure: Secondary | ICD-10-CM | POA: Diagnosis not present

## 2020-09-13 DIAGNOSIS — R079 Chest pain, unspecified: Secondary | ICD-10-CM | POA: Diagnosis not present

## 2020-09-13 DIAGNOSIS — J9601 Acute respiratory failure with hypoxia: Secondary | ICD-10-CM | POA: Diagnosis not present

## 2020-09-13 DIAGNOSIS — I482 Chronic atrial fibrillation, unspecified: Secondary | ICD-10-CM | POA: Diagnosis not present

## 2020-09-13 DIAGNOSIS — I4891 Unspecified atrial fibrillation: Secondary | ICD-10-CM | POA: Diagnosis present

## 2020-09-13 DIAGNOSIS — I10 Essential (primary) hypertension: Secondary | ICD-10-CM | POA: Diagnosis not present

## 2020-09-13 DIAGNOSIS — D6859 Other primary thrombophilia: Secondary | ICD-10-CM | POA: Diagnosis not present

## 2020-09-13 DIAGNOSIS — N183 Chronic kidney disease, stage 3 unspecified: Secondary | ICD-10-CM | POA: Diagnosis present

## 2020-09-13 DIAGNOSIS — Z833 Family history of diabetes mellitus: Secondary | ICD-10-CM

## 2020-09-13 DIAGNOSIS — Z853 Personal history of malignant neoplasm of breast: Secondary | ICD-10-CM

## 2020-09-13 DIAGNOSIS — R52 Pain, unspecified: Secondary | ICD-10-CM

## 2020-09-13 DIAGNOSIS — M79601 Pain in right arm: Secondary | ICD-10-CM

## 2020-09-13 DIAGNOSIS — E785 Hyperlipidemia, unspecified: Secondary | ICD-10-CM | POA: Diagnosis present

## 2020-09-13 DIAGNOSIS — M25511 Pain in right shoulder: Secondary | ICD-10-CM | POA: Diagnosis not present

## 2020-09-13 DIAGNOSIS — R0602 Shortness of breath: Secondary | ICD-10-CM

## 2020-09-13 LAB — TROPONIN I (HIGH SENSITIVITY)
Troponin I (High Sensitivity): 6 ng/L (ref <18)
Troponin I (High Sensitivity): 6 ng/L (ref <18)

## 2020-09-13 LAB — BASIC METABOLIC PANEL
Anion gap: 13 (ref 5–15)
BUN: 35 mg/dL — ABNORMAL HIGH (ref 8–23)
CO2: 24 mmol/L (ref 22–32)
Calcium: 9.6 mg/dL (ref 8.9–10.3)
Chloride: 104 mmol/L (ref 98–111)
Creatinine, Ser: 1.55 mg/dL — ABNORMAL HIGH (ref 0.44–1.00)
GFR calc Af Amer: 37 mL/min — ABNORMAL LOW (ref >60)
GFR calc non Af Amer: 32 mL/min — ABNORMAL LOW (ref >60)
Glucose, Bld: 101 mg/dL — ABNORMAL HIGH (ref 70–99)
Potassium: 4.7 mmol/L (ref 3.5–5.1)
Sodium: 141 mmol/L (ref 135–145)

## 2020-09-13 LAB — CBC
HCT: 50.6 % — ABNORMAL HIGH (ref 36.0–46.0)
Hemoglobin: 15.7 g/dL — ABNORMAL HIGH (ref 12.0–15.0)
MCH: 27.4 pg (ref 26.0–34.0)
MCHC: 31 g/dL (ref 30.0–36.0)
MCV: 88.2 fL (ref 80.0–100.0)
Platelets: 156 10*3/uL (ref 150–400)
RBC: 5.74 MIL/uL — ABNORMAL HIGH (ref 3.87–5.11)
RDW: 14 % (ref 11.5–15.5)
WBC: 6.3 10*3/uL (ref 4.0–10.5)
nRBC: 0 % (ref 0.0–0.2)

## 2020-09-13 LAB — PROTIME-INR
INR: 2.6 — ABNORMAL HIGH (ref 0.8–1.2)
Prothrombin Time: 27.1 seconds — ABNORMAL HIGH (ref 11.4–15.2)

## 2020-09-13 LAB — BRAIN NATRIURETIC PEPTIDE: B Natriuretic Peptide: 175.2 pg/mL — ABNORMAL HIGH (ref 0.0–100.0)

## 2020-09-13 MED ORDER — ACETAMINOPHEN 325 MG PO TABS
650.0000 mg | ORAL_TABLET | Freq: Once | ORAL | Status: AC
  Administered 2020-09-13: 650 mg via ORAL
  Filled 2020-09-13: qty 2

## 2020-09-13 MED ORDER — DIPHENHYDRAMINE HCL 25 MG PO CAPS: 50.0000 mg | ORAL_CAPSULE | Freq: Once | ORAL | Status: AC

## 2020-09-13 MED ORDER — IPRATROPIUM-ALBUTEROL 0.5-2.5 (3) MG/3ML IN SOLN: 3.0000 mL | Freq: Once | RESPIRATORY_TRACT | Status: DC

## 2020-09-13 MED ORDER — HYDROCORTISONE NA SUCCINATE PF 250 MG IJ SOLR
200.0000 mg | Freq: Once | INTRAMUSCULAR | Status: AC
  Administered 2020-09-13: 200 mg via INTRAVENOUS
  Filled 2020-09-13: qty 200

## 2020-09-13 MED ORDER — ALBUTEROL SULFATE HFA 108 (90 BASE) MCG/ACT IN AERS
6.0000 | INHALATION_SPRAY | Freq: Once | RESPIRATORY_TRACT | Status: AC
  Administered 2020-09-13: 6 via RESPIRATORY_TRACT
  Filled 2020-09-13: qty 6.7

## 2020-09-13 MED ORDER — DIPHENHYDRAMINE HCL 50 MG/ML IJ SOLN
50.0000 mg | Freq: Once | INTRAMUSCULAR | Status: AC
  Administered 2020-09-13: 50 mg via INTRAVENOUS
  Filled 2020-09-13: qty 1

## 2020-09-13 MED ORDER — IPRATROPIUM BROMIDE HFA 17 MCG/ACT IN AERS
2.0000 | INHALATION_SPRAY | Freq: Once | RESPIRATORY_TRACT | Status: AC
  Administered 2020-09-13: 2 via RESPIRATORY_TRACT
  Filled 2020-09-13: qty 12.9

## 2020-09-13 NOTE — ED Triage Notes (Signed)
Patient reports chest pain and pain under right arm for around 5 days.-patient states she is in no pain now though. Says she is unable to sleep at night due to pain when it does hurt.

## 2020-09-13 NOTE — ED Provider Notes (Signed)
22:00: Assumed care of patient from [facility] PA-C at change of shift pending CTA & disposition.   Please see prior provider note for full H&P.  Briefly patient is a 79 yo female with a history of COPD, multiple prior  malignancies, afib on coumadin, hypertension, & hyperlipidemia who presented to the ED with complaints of R sided chest pain x 5 days, reports some increase in her dyspnea w/ exertion. Has had some dry cough & intermittent wheezing as well. Frequently has wheezing/dyspnea intermittently at baseline. Does not wear oxygen at home.   Physical Exam  BP (!) 145/60   Pulse 67   Temp 98.1 F (36.7 C) (Oral)   Resp 14   Ht 5\' 11"  (1.803 m)   Wt 127 kg   SpO2 90%   BMI 39.05 kg/m   Physical Exam Vitals and nursing note reviewed.  Constitutional:      General: She is not in acute distress.    Appearance: She is well-developed.  HENT:     Head: Normocephalic and atraumatic.  Eyes:     General:        Right eye: No discharge.        Left eye: No discharge.     Conjunctiva/sclera: Conjunctivae normal.  Pulmonary:     Breath sounds: Wheezing (mild expiratory) and rhonchi (scattered) present.  Neurological:     Mental Status: She is alert.     Comments: Clear speech.   Psychiatric:        Behavior: Behavior normal.        Thought Content: Thought content normal.    ED Course/Procedures     Results for orders placed or performed during the hospital encounter of 95/28/41  Basic metabolic panel  Result Value Ref Range   Sodium 141 135 - 145 mmol/L   Potassium 4.7 3.5 - 5.1 mmol/L   Chloride 104 98 - 111 mmol/L   CO2 24 22 - 32 mmol/L   Glucose, Bld 101 (H) 70 - 99 mg/dL   BUN 35 (H) 8 - 23 mg/dL   Creatinine, Ser 1.55 (H) 0.44 - 1.00 mg/dL   Calcium 9.6 8.9 - 10.3 mg/dL   GFR calc non Af Amer 32 (L) >60 mL/min   GFR calc Af Amer 37 (L) >60 mL/min   Anion gap 13 5 - 15  CBC  Result Value Ref Range   WBC 6.3 4.0 - 10.5 K/uL   RBC 5.74 (H) 3.87 - 5.11 MIL/uL    Hemoglobin 15.7 (H) 12.0 - 15.0 g/dL   HCT 50.6 (H) 36 - 46 %   MCV 88.2 80.0 - 100.0 fL   MCH 27.4 26.0 - 34.0 pg   MCHC 31.0 30.0 - 36.0 g/dL   RDW 14.0 11.5 - 15.5 %   Platelets 156 150 - 400 K/uL   nRBC 0.0 0.0 - 0.2 %  Protime-INR  Result Value Ref Range   Prothrombin Time 27.1 (H) 11.4 - 15.2 seconds   INR 2.6 (H) 0.8 - 1.2  Brain natriuretic peptide  Result Value Ref Range   B Natriuretic Peptide 175.2 (H) 0.0 - 100.0 pg/mL  Troponin I (High Sensitivity)  Result Value Ref Range   Troponin I (High Sensitivity) 6 <18 ng/L  Troponin I (High Sensitivity)  Result Value Ref Range   Troponin I (High Sensitivity) 6 <18 ng/L   DG Chest 2 View  Result Date: 09/13/2020 CLINICAL DATA:  Chest pain and right upper quadrant pain five days. History of  lung and colon cancer. EXAM: CHEST - 2 VIEW COMPARISON:  10/15/2019 and chest CT 08/01/2020 FINDINGS: Right-sided Port-A-Cath unchanged. Lungs are adequately inflated with surgical sutures over the upper lungs bilaterally. There is hazy prominence of the perihilar markings which may be due to mild vascular congestion. Mild stable cardiomegaly. Remainder of the exam is unchanged. IMPRESSION: 1. Suggestion of mild vascular congestion. Postsurgical change over the upper lungs. 2.  Mild stable cardiomegaly. Electronically Signed   By: Marin Olp M.D.   On: 09/13/2020 16:17   CT Angio Chest PE W/Cm &/Or Wo Cm  Result Date: 09/14/2020 CLINICAL DATA:  Chest pain EXAM: CT ANGIOGRAPHY CHEST WITH CONTRAST TECHNIQUE: Multidetector CT imaging of the chest was performed using the standard protocol during bolus administration of intravenous contrast. Multiplanar CT image reconstructions and MIPs were obtained to evaluate the vascular anatomy. CONTRAST:  44mL OMNIPAQUE IOHEXOL 350 MG/ML SOLN COMPARISON:  08/01/2020 FINDINGS: Cardiovascular: There is adequate opacification of the pulmonary arterial tree through the segmental level. There is no intraluminal  filling defect to suggest acute pulmonary embolism. The central pulmonary arteries are enlarged in keeping with changes of pulmonary arterial hypertension. There is extensive coronary artery calcification. Cardiac size is at the upper limits of normal. No pericardial effusion. Atherosclerotic calcification is seen within the thoracic aorta. No aortic aneurysm identified. Right internal jugular chest port is in place with its tip within the right atrium. Mediastinum/Nodes: No pathologic thoracic adenopathy. Esophagus unremarkable. Thyroid unremarkable. Lungs/Pleura: Surgical changes of a partial right, superior segment right, and partial left upper lobectomy are again identified. Since the prior examination, there has developed mild smooth interlobular septal thickening as well as ground-glass pulmonary infiltrate suspicious for changes of mild to moderate interstitial pulmonary edema, possibly cardiogenic in nature. 12 mm right lower lobe subpleural ground-glass pulmonary nodule is again identified and is unchanged. No pneumothorax or pleural effusion. Central airways are patent. Upper Abdomen: No acute abnormality. Musculoskeletal: No chest wall abnormality. No acute or significant osseous findings. Review of the MIP images confirms the above findings. IMPRESSION: No pulmonary embolism. Interval development of mild to moderate pulmonary edema, possibly cardiogenic in nature. Extensive coronary artery calcification. Morphologic changes in keeping with pulmonary arterial hypertension. Stable 12 mm right lower lobe subpleural pulmonary nodule. Aortic Atherosclerosis (ICD10-I70.0). Electronically Signed   By: Fidela Salisbury MD   On: 09/14/2020 01:42     Procedures  MDM  EKG without significant ischemic changes, troponins are flat, doubt ACS.  CT angio negative for pulmonary embolism, patient does have notable mild to moderate pulmonary edema on CT imaging, her BNP is mildly elevated, she does not have a  documented history of heart failure or take any diuretic.  Trialed ambulation patient desaturated to 81% on room air.  She also appeared quite tachypneic.  Will administer Lasix and discuss with hospitalist service.  Covid test is ordered, suspect there may be a degree of COPD playing a factor as well, does report some wheezing at baseline, patient may benefit from neb treatments following negative Covid testing per hospital protocol.  04:16: CONSULT: Discussed with hospitalist Dr. Myna Hidalgo- accepts admission.   Findings and plan of care discussed with supervising physician Dr. Christy Gentles who is in agreement.            Leafy Kindle 09/14/20 Kathee Delton, MD 09/14/20 [phone]

## 2020-09-13 NOTE — ED Notes (Signed)
Patient has a blue top in the main lab °
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
## 2020-09-14 ENCOUNTER — Emergency Department (HOSPITAL_COMMUNITY): Payer: PPO

## 2020-09-14 ENCOUNTER — Encounter (HOSPITAL_COMMUNITY): Payer: Self-pay | Admitting: Family Medicine

## 2020-09-14 ENCOUNTER — Inpatient Hospital Stay (HOSPITAL_COMMUNITY): Payer: PPO

## 2020-09-14 DIAGNOSIS — N1832 Chronic kidney disease, stage 3b: Secondary | ICD-10-CM | POA: Diagnosis present

## 2020-09-14 DIAGNOSIS — J449 Chronic obstructive pulmonary disease, unspecified: Secondary | ICD-10-CM | POA: Diagnosis present

## 2020-09-14 DIAGNOSIS — C3431 Malignant neoplasm of lower lobe, right bronchus or lung: Secondary | ICD-10-CM | POA: Diagnosis present

## 2020-09-14 DIAGNOSIS — I130 Hypertensive heart and chronic kidney disease with heart failure and stage 1 through stage 4 chronic kidney disease, or unspecified chronic kidney disease: Secondary | ICD-10-CM | POA: Diagnosis present

## 2020-09-14 DIAGNOSIS — I5033 Acute on chronic diastolic (congestive) heart failure: Secondary | ICD-10-CM

## 2020-09-14 DIAGNOSIS — C189 Malignant neoplasm of colon, unspecified: Secondary | ICD-10-CM

## 2020-09-14 DIAGNOSIS — Z833 Family history of diabetes mellitus: Secondary | ICD-10-CM | POA: Diagnosis not present

## 2020-09-14 DIAGNOSIS — J302 Other seasonal allergic rhinitis: Secondary | ICD-10-CM | POA: Diagnosis present

## 2020-09-14 DIAGNOSIS — I482 Chronic atrial fibrillation, unspecified: Secondary | ICD-10-CM | POA: Diagnosis present

## 2020-09-14 DIAGNOSIS — Z6839 Body mass index (BMI) 39.0-39.9, adult: Secondary | ICD-10-CM | POA: Diagnosis not present

## 2020-09-14 DIAGNOSIS — I5032 Chronic diastolic (congestive) heart failure: Secondary | ICD-10-CM | POA: Diagnosis present

## 2020-09-14 DIAGNOSIS — I4891 Unspecified atrial fibrillation: Secondary | ICD-10-CM

## 2020-09-14 DIAGNOSIS — Z8249 Family history of ischemic heart disease and other diseases of the circulatory system: Secondary | ICD-10-CM | POA: Diagnosis not present

## 2020-09-14 DIAGNOSIS — Z7901 Long term (current) use of anticoagulants: Secondary | ICD-10-CM | POA: Diagnosis not present

## 2020-09-14 DIAGNOSIS — Z20822 Contact with and (suspected) exposure to covid-19: Secondary | ICD-10-CM | POA: Diagnosis present

## 2020-09-14 DIAGNOSIS — M79601 Pain in right arm: Secondary | ICD-10-CM | POA: Diagnosis present

## 2020-09-14 DIAGNOSIS — Z85118 Personal history of other malignant neoplasm of bronchus and lung: Secondary | ICD-10-CM | POA: Diagnosis not present

## 2020-09-14 DIAGNOSIS — J9601 Acute respiratory failure with hypoxia: Secondary | ICD-10-CM

## 2020-09-14 DIAGNOSIS — Z87891 Personal history of nicotine dependence: Secondary | ICD-10-CM | POA: Diagnosis not present

## 2020-09-14 DIAGNOSIS — E785 Hyperlipidemia, unspecified: Secondary | ICD-10-CM | POA: Diagnosis present

## 2020-09-14 DIAGNOSIS — D6859 Other primary thrombophilia: Secondary | ICD-10-CM | POA: Diagnosis present

## 2020-09-14 DIAGNOSIS — Z85038 Personal history of other malignant neoplasm of large intestine: Secondary | ICD-10-CM | POA: Diagnosis not present

## 2020-09-14 DIAGNOSIS — Z79899 Other long term (current) drug therapy: Secondary | ICD-10-CM | POA: Diagnosis not present

## 2020-09-14 DIAGNOSIS — Z853 Personal history of malignant neoplasm of breast: Secondary | ICD-10-CM | POA: Diagnosis not present

## 2020-09-14 DIAGNOSIS — Z8673 Personal history of transient ischemic attack (TIA), and cerebral infarction without residual deficits: Secondary | ICD-10-CM | POA: Diagnosis not present

## 2020-09-14 LAB — ECHOCARDIOGRAM COMPLETE
AR max vel: 1.17 cm2
AV Area VTI: 1.21 cm2
AV Area mean vel: 1.19 cm2
AV Mean grad: 8 mmHg
AV Peak grad: 15.2 mmHg
Ao pk vel: 1.95 m/s
Area-P 1/2: 3.99 cm2
Height: 71 in
S' Lateral: 3.3 cm
Weight: 4480 oz

## 2020-09-14 LAB — SARS CORONAVIRUS 2 BY RT PCR (HOSPITAL ORDER, PERFORMED IN ~~LOC~~ HOSPITAL LAB): SARS Coronavirus 2: NEGATIVE

## 2020-09-14 MED ORDER — FUROSEMIDE 10 MG/ML IJ SOLN
40.0000 mg | Freq: Once | INTRAMUSCULAR | Status: AC
  Administered 2020-09-14: 40 mg via INTRAVENOUS
  Filled 2020-09-14: qty 4

## 2020-09-14 MED ORDER — LIDOCAINE 5 % EX PTCH
1.0000 | MEDICATED_PATCH | CUTANEOUS | Status: DC
  Administered 2020-09-14: 1 via TRANSDERMAL
  Filled 2020-09-14 (×3): qty 1

## 2020-09-14 MED ORDER — SODIUM CHLORIDE 0.9% FLUSH
10.0000 mL | INTRAVENOUS | Status: DC | PRN
  Administered 2020-09-16: 10 mL

## 2020-09-14 MED ORDER — FUROSEMIDE 10 MG/ML IJ SOLN
40.0000 mg | Freq: Two times a day (BID) | INTRAMUSCULAR | Status: DC
  Administered 2020-09-14 – 2020-09-15 (×3): 40 mg via INTRAVENOUS
  Filled 2020-09-14 (×3): qty 4

## 2020-09-14 MED ORDER — ACETAMINOPHEN 325 MG PO TABS
650.0000 mg | ORAL_TABLET | ORAL | Status: DC | PRN
  Administered 2020-09-14 – 2020-09-16 (×3): 650 mg via ORAL
  Filled 2020-09-14 (×3): qty 2

## 2020-09-14 MED ORDER — SODIUM CHLORIDE 0.9 % IV SOLN
250.0000 mL | INTRAVENOUS | Status: DC | PRN
  Administered 2020-09-16: 250 mL via INTRAVENOUS

## 2020-09-14 MED ORDER — POTASSIUM CHLORIDE CRYS ER 20 MEQ PO TBCR: 20.0000 meq | EXTENDED_RELEASE_TABLET | Freq: Once | ORAL | Status: DC

## 2020-09-14 MED ORDER — AMLODIPINE BESYLATE 10 MG PO TABS
10.0000 mg | ORAL_TABLET | Freq: Every day | ORAL | Status: DC
  Administered 2020-09-14 – 2020-09-16 (×3): 10 mg via ORAL
  Filled 2020-09-14 (×2): qty 1
  Filled 2020-09-14: qty 2

## 2020-09-14 MED ORDER — ALBUTEROL SULFATE HFA 108 (90 BASE) MCG/ACT IN AERS: 2.0000 | INHALATION_SPRAY | Freq: Four times a day (QID) | RESPIRATORY_TRACT | Status: DC | PRN

## 2020-09-14 MED ORDER — METOPROLOL TARTRATE 50 MG PO TABS
100.0000 mg | ORAL_TABLET | Freq: Two times a day (BID) | ORAL | Status: DC
  Administered 2020-09-14 – 2020-09-16 (×5): 100 mg via ORAL
  Filled 2020-09-14 (×2): qty 2
  Filled 2020-09-14: qty 4
  Filled 2020-09-14 (×2): qty 2

## 2020-09-14 MED ORDER — BECLOMETHASONE DIPROP HFA 40 MCG/ACT IN AERB
1.0000 | INHALATION_SPRAY | Freq: Two times a day (BID) | RESPIRATORY_TRACT | Status: DC
  Administered 2020-09-14 – 2020-09-16 (×5): 1 via RESPIRATORY_TRACT
  Filled 2020-09-14: qty 10.6

## 2020-09-14 MED ORDER — SIMVASTATIN 20 MG PO TABS
10.0000 mg | ORAL_TABLET | Freq: Every day | ORAL | Status: DC
  Administered 2020-09-14 – 2020-09-16 (×3): 10 mg via ORAL
  Filled 2020-09-14 (×3): qty 1

## 2020-09-14 MED ORDER — HYDROCODONE-ACETAMINOPHEN 5-325 MG PO TABS: 1.0000 | ORAL_TABLET | Freq: Four times a day (QID) | ORAL | Status: DC | PRN

## 2020-09-14 MED ORDER — ONDANSETRON HCL 4 MG/2ML IJ SOLN: 4.0000 mg | Freq: Four times a day (QID) | INTRAMUSCULAR | Status: DC | PRN

## 2020-09-14 MED ORDER — SODIUM CHLORIDE 0.9% FLUSH
3.0000 mL | Freq: Two times a day (BID) | INTRAVENOUS | Status: DC
  Administered 2020-09-14 – 2020-09-15 (×3): 3 mL via INTRAVENOUS

## 2020-09-14 MED ORDER — SODIUM CHLORIDE 0.9% FLUSH: 3.0000 mL | INTRAVENOUS | Status: DC | PRN

## 2020-09-14 MED ORDER — WARFARIN - PHARMACIST DOSING INPATIENT: Freq: Every day | Status: DC

## 2020-09-14 MED ORDER — IOHEXOL 350 MG/ML SOLN
80.0000 mL | Freq: Once | INTRAVENOUS | Status: AC | PRN
  Administered 2020-09-14: 80 mL via INTRAVENOUS

## 2020-09-14 MED ORDER — WARFARIN SODIUM 2.5 MG PO TABS
2.5000 mg | ORAL_TABLET | Freq: Once | ORAL | Status: AC
  Administered 2020-09-14: 2.5 mg via ORAL
  Filled 2020-09-14 (×2): qty 1

## 2020-09-14 MED ORDER — CHLORHEXIDINE GLUCONATE CLOTH 2 % EX PADS
6.0000 | MEDICATED_PAD | Freq: Every day | CUTANEOUS | Status: DC
  Administered 2020-09-14 – 2020-09-15 (×2): 6 via TOPICAL

## 2020-09-14 NOTE — Progress Notes (Signed)
PROGRESS NOTE  Tammie [name]:[id number] DOB: January 22, 1941 DOA: 09/13/2020 PCP: [name], FNP   LOS: 0 days   Brief narrative: As per HPI,  Tammie [name] is a 79 y.o. female with medical history significant for lung cancer, colon cancer, atrial fibrillation on warfarin, COPD, and BMI 39, who presented to the emergency department for evaluation of worsening shortness of breath and pain under her right arm.    Patient reported progressive dyspnea especially on lying down without any fever or chills.  She did have some pain over the right axillary region as well.  Upon arrival to the ED, patient was found to be afebrile, saturating 87% on room air, tachypneic, and with stable blood pressure.  EKG showed atrial fibrillation.  CTA chest was negative for PE but concerning for mild to moderate pulmonary edema.  Chemistry panel was notable for creatinine 1.55 which is similar to priors.  CBC with mild polycythemia.  INR was therapeutic at 2.6.  BNP elevated at 175.  High-sensitivity troponin normal x2.  Patient was started on supplemental oxygen and treated with albuterol and IV Lasix in the ED.    Assessment/Plan:  Principal Problem:   Acute on chronic diastolic CHF (congestive heart failure) (HCC) Active Problems:   Atrial fibrillation (HCC)   COPD with chronic bronchitis (HCC)   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 (HCC)   CKD (chronic kidney disease), stage III   Primary adenocarcinoma of colon ([place])   Acute respiratory failure with hypoxia (HCC)   Acute hypoxic respiratory failure secondary to acute on chronic diastolic heart failure. -CT scan showed evidence of pulmonary edema.  Patient received IV Lasix and is currently on 40 every 12hrly.  Continue CHF protocol, intake output charting, daily weight.  Fluid restriction 1500 mL/day.  COVID-19 test was negative.  BNP mildly elevated at 175.  No chest pain.  EKG showed rate controlled atrial fibrillation.  2D echocardiogram  from today showed ejection fraction of 60 to 65% with mild LVH and indeterminate diastolic parameters.  Continue metoprolol for now.  COPD  Continue ICS and as-needed albuterol .  No active wheezing at this time.   Atrial fibrillation  - Rate controlled on admission.  CHADS-VASc at least 4 (gender, age x2, HTN) .  Currently on warfarin and metoprolol.  We will continue with that.  INR is therapeutic.  CKD IIIb  - SCr is 1.55 on admission, will closely monitor.  Likely at baseline.  Monitor creatinine on diuretics.  Lung cancer; colon cancer  Will need outpatient follow-up.  DVT prophylaxis:  warfarin (COUMADIN) tablet 2.5 mg    Code Status: Full code  Family Communication: I called the patient's grandson Mr Will and updated him about the clinical condition of the patient.   Status is: Inpatient  Remains inpatient appropriate because:IV treatments appropriate due to intensity of illness or inability to take PO and Inpatient level of care appropriate due to severity of illness   Dispo: The patient is from: Home              Anticipated d/c is to: Home              Anticipated d/c date is: 2 days              Patient currently is not medically stable to d/c.  Will get physical therapy for ambulation.   Consultants:  None  Procedures:  None  Antibiotics:   None  Anti-infectives (From admission,  onward)   None     Subjective:  Today, patient was seen and examined at bedside.  Patient complains of feeling little better with breathing.  Denies any chest wall pain.  No fever chills or rigor.  Denies any nausea vomiting or abdominal pain.  Objective: Vitals:   09/14/20 0500 09/14/20 0600  BP: (!) 164/77 (!) 172/87  Pulse: 65 76  Resp: (!) 22 (!) 25  Temp:    SpO2: 99% 98%   No intake or output data in the 24 hours ending 09/14/20 0803 Filed Weights   09/13/20 1534  Weight: 127 kg   Body mass index is 39.05 kg/m.   Physical Exam: GENERAL: Patient is  alert awake and communicative not in obvious distress.  Class II obesity.  On nasal cannula. HENT: No scleral pallor or icterus. Pupils equally reactive to light. Oral mucosa is moist NECK: is supple, no gross swelling noted. CHEST:   Diminished breath sounds bilaterally.  Coarse breath sounds noted bilaterally. CVS: S1 and S2 heard, no murmur. Regular rate and rhythm.  ABDOMEN: Soft, non-tender, bowel sounds are present. EXTREMITIES: No cyanosis or clubbing.  Trace edema.  Features of chronic venous insufficiency. CNS: Cranial nerves are intact. No focal motor deficits. SKIN: warm and dry without rashes.  Data Review: I have personally reviewed the following laboratory data and studies,  CBC: Recent Labs  Lab 09/13/20 1548  WBC 6.3  HGB 15.7*  HCT 50.6*  MCV 88.2  PLT 347   Basic Metabolic Panel: Recent Labs  Lab 09/13/20 1548  NA 141  K 4.7  CL 104  CO2 24  GLUCOSE 101*  BUN 35*  CREATININE 1.55*  CALCIUM 9.6   Liver Function Tests: No results for input(s): AST, ALT, ALKPHOS, BILITOT, PROT, ALBUMIN in the last 168 hours. No results for input(s): LIPASE, AMYLASE in the last 168 hours. No results for input(s): AMMONIA in the last 168 hours. Cardiac Enzymes: No results for input(s): CKTOTAL, CKMB, CKMBINDEX, TROPONINI in the last 168 hours. BNP (last 3 results) Recent Labs    09/13/20 2025  BNP 175.2*    ProBNP (last 3 results) No results for input(s): PROBNP in the last 8760 hours.  CBG: No results for input(s): GLUCAP in the last 168 hours. Recent Results (from the past 240 hour(s))  SARS Coronavirus 2 by RT PCR (hospital order, performed in [facility] hospital lab) Nasopharyngeal Nasopharyngeal Swab     Status: None   Collection Time: 09/14/20  5:21 AM   Specimen: Nasopharyngeal Swab  Result Value Ref Range Status   SARS Coronavirus 2 NEGATIVE NEGATIVE Final    Comment: (NOTE) SARS-CoV-2 target nucleic acids are NOT DETECTED.  The SARS-CoV-2 RNA is  generally detectable in upper and lower respiratory specimens during the acute phase of infection. The lowest concentration of SARS-CoV-2 viral copies this assay can detect is 250 copies / mL. A negative result does not preclude SARS-CoV-2 infection and should not be used as the sole basis for treatment or other patient management decisions.  A negative result may occur with improper specimen collection / handling, submission of specimen other than nasopharyngeal swab, presence of viral mutation(s) within the areas targeted by this assay, and inadequate number of viral copies (<250 copies / mL). A negative result must be combined with clinical observations, patient history, and epidemiological information.  Fact Sheet for Patients:   StrictlyIdeas.no  Fact Sheet for Healthcare Providers: BankingDealers.co.za  This test is not yet approved or  cleared by the  [place] Architectural technologist and has been authorized for detection and/or diagnosis of SARS-CoV-2 by FDA under an Print production planner (EUA).  This EUA will remain in effect (meaning this test can be used) for the duration of the COVID-19 declaration under Section 564(b)(1) of the Act, 21 U.S.C. section 360bbb-3(b)(1), unless the authorization is terminated or revoked sooner.  Performed at [facility], [place] [address], [place], [place] [address]      Studies: DG Chest 2 View  Result Date: 09/13/2020 CLINICAL DATA:  Chest pain and right upper quadrant pain five days. History of lung and colon cancer. EXAM: CHEST - 2 VIEW COMPARISON:  10/15/2019 and chest CT 08/01/2020 FINDINGS: Right-sided Port-A-Cath unchanged. Lungs are adequately inflated with surgical sutures over the upper lungs bilaterally. There is hazy prominence of the perihilar markings which may be due to mild vascular congestion. Mild stable cardiomegaly. Remainder of the exam is unchanged. IMPRESSION: 1.  Suggestion of mild vascular congestion. Postsurgical change over the upper lungs. 2.  Mild stable cardiomegaly. Electronically Signed   By: Marin Olp M.D.   On: 09/13/2020 16:17   CT Angio Chest PE W/Cm &/Or Wo Cm  Result Date: 09/14/2020 CLINICAL DATA:  Chest pain EXAM: CT ANGIOGRAPHY CHEST WITH CONTRAST TECHNIQUE: Multidetector CT imaging of the chest was performed using the standard protocol during bolus administration of intravenous contrast. Multiplanar CT image reconstructions and MIPs were obtained to evaluate the vascular anatomy. CONTRAST:  62mL OMNIPAQUE IOHEXOL 350 MG/ML SOLN COMPARISON:  08/01/2020 FINDINGS: Cardiovascular: There is adequate opacification of the pulmonary arterial tree through the segmental level. There is no intraluminal filling defect to suggest acute pulmonary embolism. The central pulmonary arteries are enlarged in keeping with changes of pulmonary arterial hypertension. There is extensive coronary artery calcification. Cardiac size is at the upper limits of normal. No pericardial effusion. Atherosclerotic calcification is seen within the thoracic aorta. No aortic aneurysm identified. Right internal jugular chest port is in place with its tip within the right atrium. Mediastinum/Nodes: No pathologic thoracic adenopathy. Esophagus unremarkable. Thyroid unremarkable. Lungs/Pleura: Surgical changes of a partial right, superior segment right, and partial left upper lobectomy are again identified. Since the prior examination, there has developed mild smooth interlobular septal thickening as well as ground-glass pulmonary infiltrate suspicious for changes of mild to moderate interstitial pulmonary edema, possibly cardiogenic in nature. 12 mm right lower lobe subpleural ground-glass pulmonary nodule is again identified and is unchanged. No pneumothorax or pleural effusion. Central airways are patent. Upper Abdomen: No acute abnormality. Musculoskeletal: No chest wall abnormality. No  acute or significant osseous findings. Review of the MIP images confirms the above findings. IMPRESSION: No pulmonary embolism. Interval development of mild to moderate pulmonary edema, possibly cardiogenic in nature. Extensive coronary artery calcification. Morphologic changes in keeping with pulmonary arterial hypertension. Stable 12 mm right lower lobe subpleural pulmonary nodule. Aortic Atherosclerosis (ICD10-I70.0). Electronically Signed   By: Fidela Salisbury MD   On: 09/14/2020 01:42      Flora Lipps, MD  [facility] 09/14/2020

## 2020-09-14 NOTE — Evaluation (Signed)
Physical Therapy Evaluation Patient Details Name: Tammie [name] MRN: [id number] DOB: 18-Feb-1941 Today's Date: 09/14/2020   History of Present Illness  Pt admitted with c/o chest pain and dx with acute CHF and acute repiratory failure.  Pt with hx of CVA with memory deficits, COPD, Lung CA, Colon CA, and a-fib.  Clinical Impression  Pt admitted as above and presenting with functional mobility limitations 2* mild ambulatory balance deficits and decreased endurance.  This date, pt ambulated with no assistive device demonstrating mild general instability but no over LOB - pt noted to touch on wall intermittently.  Pt ambulated 200' and desat to 86% but talking at the same time.  Pt ambulated additional 200' on RA but not talking and maintained O2 sats at 93% or higher.  Pt is eager for dc home with assist of good friend.    Follow Up Recommendations No PT follow up    Equipment Recommendations  None recommended by PT    Recommendations for Other Services       Precautions / Restrictions Precautions Precautions: Fall Restrictions Weight Bearing Restrictions: No      Mobility  Bed Mobility Overal bed mobility: Modified Independent             General bed mobility comments: Pt unassisted OOB  Transfers Overall transfer level: Modified independent Equipment used: None             General transfer comment: Pt up from EOB, to/from comode and to recliner with no physical assist  Ambulation/Gait Ambulation/Gait assistance: Min guard;Supervision Gait Distance (Feet): 400 Feet Assistive device: None Gait Pattern/deviations: Step-through pattern;Shuffle Gait velocity: mod pace   General Gait Details: Mild general instability with pt intemittently touching on hall railings.  No overt LOB  Stairs            Wheelchair Mobility    Modified Rankin (Stroke Patients Only)       Balance Overall balance assessment: Needs assistance Sitting-balance support: No upper  extremity supported;Feet supported Sitting balance-Leahy Scale: Normal     Standing balance support: No upper extremity supported Standing balance-Leahy Scale: Good                               Pertinent Vitals/Pain Pain Assessment: No/denies pain    Home Living Family/patient expects to be discharged to:: Private residence Living Arrangements: Non-relatives/Friends Available Help at Discharge: Family Type of Home: House Home Access: Stairs to enter     Home Layout: One level Home Equipment: Environmental consultant - 4 wheels;Cane - single point;Bedside commode      Prior Function Level of Independence: Independent         Comments: used cane as needed     Hand Dominance        Extremity/Trunk Assessment   Upper Extremity Assessment Upper Extremity Assessment: Overall WFL for tasks assessed    Lower Extremity Assessment Lower Extremity Assessment: Overall WFL for tasks assessed       Communication   Communication: HOH  Cognition Arousal/Alertness: Awake/alert Behavior During Therapy: WFL for tasks assessed/performed;Impulsive Overall Cognitive Status: Within Functional Limits for tasks assessed                                        General Comments      Exercises     Assessment/Plan    PT  Assessment Patient needs continued PT services  PT Problem List Decreased activity tolerance;Decreased balance;Obesity       PT Treatment Interventions DME instruction;Gait training;Stair training;Functional mobility training;Therapeutic activities;Therapeutic exercise;Patient/family education;Balance training    PT Goals (Current goals can be found in the Care Plan section)  Acute Rehab PT Goals Patient Stated Goal: Go Home PT Goal Formulation: With patient Time For Goal Achievement: 09/28/20 Potential to Achieve Goals: Good    Frequency Min 3X/week   Barriers to discharge        Co-evaluation               AM-PAC PT "6  Clicks" Mobility  Outcome Measure Help needed turning from your back to your side while in a flat bed without using bedrails?: None Help needed moving from lying on your back to sitting on the side of a flat bed without using bedrails?: None Help needed moving to and from a bed to a chair (including a wheelchair)?: A Little Help needed standing up from a chair using your arms (e.g., wheelchair or bedside chair)?: A Little Help needed to walk in hospital room?: A Little Help needed climbing 3-5 steps with a railing? : A Little 6 Click Score: 20    End of Session Equipment Utilized During Treatment: Gait belt Activity Tolerance: Patient tolerated treatment well Patient left: in chair;with call bell/phone within reach;with chair alarm set Nurse Communication: Mobility status PT Visit Diagnosis: Unsteadiness on feet (R26.81);Difficulty in walking, not elsewhere classified (R26.2)    Time: 5726-2035 PT Time Calculation (min) (ACUTE ONLY): 14 min   Charges:   PT Evaluation $PT Eval Low Complexity: 1 Low          [place] [facility] Pager [phone] Office [phone]   Tykeria Wawrzyniak 09/14/2020, 4:39 PM
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
## 2020-09-14 NOTE — ED Notes (Signed)
Ambulated patient throughout the ER on room air. Oxygen saturation began at 94%, hr 70 and respirations unlabored. Pt O2 decreased to 82% and became labored. Wheelchair obtained and pt returned to room.
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
## 2020-09-14 NOTE — Progress Notes (Signed)
TOC CSW consult request has been received. CSW attempting to follow up at present time.   CSW will continue to follow for dc needs.  Theron Cumbie Tarpley-Carter, MSW, LCSW-A Pronouns:  She, Her, Hers                  [facility] Transitions of CareClinical Social Worker [email or web address] [phone]

## 2020-09-14 NOTE — Progress Notes (Signed)
Patient now on room sating 93-96%.

## 2020-09-14 NOTE — Progress Notes (Signed)
  Echocardiogram 2D Echocardiogram has been performed.  Kienna Moncada G Michail Boyte 09/14/2020, 8:45 AM

## 2020-09-15 ENCOUNTER — Inpatient Hospital Stay (HOSPITAL_COMMUNITY): Payer: PPO

## 2020-09-15 LAB — CBC
HCT: 46.5 % — ABNORMAL HIGH (ref 36.0–46.0)
Hemoglobin: 14.5 g/dL (ref 12.0–15.0)
MCH: 27.6 pg (ref 26.0–34.0)
MCHC: 31.2 g/dL (ref 30.0–36.0)
MCV: 88.4 fL (ref 80.0–100.0)
Platelets: 147 10*3/uL — ABNORMAL LOW (ref 150–400)
RBC: 5.26 MIL/uL — ABNORMAL HIGH (ref 3.87–5.11)
RDW: 14.2 % (ref 11.5–15.5)
WBC: 6.6 10*3/uL (ref 4.0–10.5)
nRBC: 0 % (ref 0.0–0.2)

## 2020-09-15 LAB — BASIC METABOLIC PANEL
Anion gap: 8 (ref 5–15)
BUN: 45 mg/dL — ABNORMAL HIGH (ref 8–23)
CO2: 32 mmol/L (ref 22–32)
Calcium: 8.5 mg/dL — ABNORMAL LOW (ref 8.9–10.3)
Chloride: 103 mmol/L (ref 98–111)
Creatinine, Ser: 2.14 mg/dL — ABNORMAL HIGH (ref 0.44–1.00)
GFR calc Af Amer: 25 mL/min — ABNORMAL LOW (ref >60)
GFR calc non Af Amer: 21 mL/min — ABNORMAL LOW (ref >60)
Glucose, Bld: 109 mg/dL — ABNORMAL HIGH (ref 70–99)
Potassium: 3.6 mmol/L (ref 3.5–5.1)
Sodium: 143 mmol/L (ref 135–145)

## 2020-09-15 LAB — PROTIME-INR
INR: 2.8 — ABNORMAL HIGH (ref 0.8–1.2)
Prothrombin Time: 28.3 seconds — ABNORMAL HIGH (ref 11.4–15.2)

## 2020-09-15 MED ORDER — FUROSEMIDE 10 MG/ML IJ SOLN
20.0000 mg | Freq: Every day | INTRAMUSCULAR | Status: DC
  Administered 2020-09-16: 20 mg via INTRAVENOUS
  Filled 2020-09-15: qty 2

## 2020-09-15 MED ORDER — WARFARIN SODIUM 2.5 MG PO TABS
2.5000 mg | ORAL_TABLET | Freq: Once | ORAL | Status: AC
  Administered 2020-09-15: 2.5 mg via ORAL
  Filled 2020-09-15: qty 1

## 2020-09-15 NOTE — Progress Notes (Signed)
Pt O2 sats down to 83% on room air.  Pt placed on O2@2L /[place] sats up to 95%.  Will continue to monitor.

## 2020-09-15 NOTE — Progress Notes (Signed)
PROGRESS NOTE    Tammie [name]  LKG:[id number] DOB: [birth year] DOA: 09/13/2020 PCP: [name], FNP    Brief Narrative: HPI per Dr. Myna Hidalgo on 09/14/2020 Tammie [name] is a 79 y.o. female with medical history significant for lung cancer, colon cancer, atrial fibrillation on warfarin, COPD, and BMI 51, now presenting to the emergency department for evaluation of worsening shortness of breath and pain under her right arm.  Patient reports that she has had progressive increase in her chronic dyspnea over the past week or so, worse at night when she tries to lay down, but without fevers, chills, or increase in her chronic cough.  She also reports some pain near the right axilla that is intermittent, worse if she lays on her left side, aching in character, and without any inciting event or alleviating or exacerbating factors.  She does not have that right sided pain currently.  ED Course: Upon arrival to the ED, patient is found to be afebrile, saturating 87% on room air, tachypneic, and with stable blood pressure.  EKG features atrial fibrillation.  CTA chest is negative for PE but concerning for mild to moderate pulmonary edema.  Chemistry panel was notable for creatinine 1.55 which is similar to priors.  CBC with mild polycythemia.  INR is therapeutic at 2.6.  BNP elevated 175.  High-sensitivity troponin normal x2.  Patient was started on supplemental oxygen and treated with albuterol and IV Lasix in the ED.    Assessment & Plan:   Principal Problem:   Acute on chronic diastolic CHF (congestive heart failure) (HCC) Active Problems:   Atrial fibrillation (HCC)   COPD with chronic bronchitis (HCC)   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 (HCC)   CKD (chronic kidney disease), stage III   Primary adenocarcinoma of colon ([place])   Acute respiratory failure with hypoxia ([place])  #1 acute hypoxic respiratory failure-secondary to acute pulmonary edema.  Chest x-ray shows findings consistent with  mild pulmonary edema, CT chest shows pulmonary edema with no evidence of PE.  However echocardiogram shows normal systolic function no mention of diastolic dysfunction.  Normal ejection fraction.   Patient was hypoxic multiple times on room air.  87% on room air in the ER and 83% on room air on the floor. Discussed with nursing staff to ambulate the patient again today on room air.  Patient lives alone.   Continue Lasix, will not add ACE inhibitor at this time due to elevated creatinine.  Continue metoprolol. Check ambulatory pulse ox.  Patient does not have oxygen prior to admission to hospital. Likely will need oxygen on discharge. Patient hesitant to go home today would like to stay another day to make sure that she is okay especially since she lives alone. #2 history of chronic COPD/lung cancer-patient is not actively wheezing continue as needed albuterol.  #3 history of chronic atrial fibrillation on Coumadin at home.  Continue metoprolol for rate control.  INR is therapeutic.  #4 acquired thrombophilia with A. fib and increased risk for stroke on Coumadin.  #5 CKD stage IIIb monitor closely on diuretics.  Creatinine 2.1 up from 1.5 on admission on diuretics.  Hold Lasix this evening and restart Lasix at 20 mg every morning.  #6 history of colon cancer needs outpatient oncology follow-up.  Estimated body mass index is 36.53 kg/m as calculated from the following:   Height as of this encounter: 5\' 11"  (1.803 m).   Weight as of this encounter: 118.8 kg.  DVT  prophylaxis: Coumadin Code Status: Full code Family Communication: Discussed with patient Disposition Plan:  Status is: Inpatient  Dispo: The patient is from: Home              Anticipated d/c is to: Home              Anticipated d/c date is:1 day              Patient currently is not medically stable to d/c.  Remains on IV diuretics.  Repeat BMP in a.m. make sure renal functions are trending down.  If so we will plan on  discharge home tomorrow. Consultants:   None  Procedures: None Antimicrobials: None  Subjective: Patient is resting in bed continues to complain of shortness of breath too weak to get out of bed does not want to walk  Objective: Vitals:   09/15/20 0144 09/15/20 0500 09/15/20 0831 09/15/20 0840  BP:    136/68  Pulse:    63  Resp:      Temp:      TempSrc:      SpO2: 95%  96%   Weight:  118.8 kg    Height:        Intake/Output Summary (Last 24 hours) at 09/15/2020 1244 Last data filed at 09/15/2020 1003 Gross per 24 hour  Intake 300 ml  Output 1950 ml  Net -1650 ml   Filed Weights   09/13/20 1534 09/14/20 1236 09/15/20 0500  Weight: 127 kg 121.6 kg 118.8 kg    Examination:  General exam: Appears calm and comfortable  Respiratory system: Diminished breath sounds at the bases to auscultation. Respiratory effort normal. Cardiovascular system: S1 & S2 heard, RRR. No JVD, murmurs, rubs, gallops or clicks. No pedal edema. Gastrointestinal system: Abdomen is nondistended, soft and nontender. No organomegaly or masses felt. Normal bowel sounds heard. Central nervous system: Alert and oriented. No focal neurological deficits. Extremities: 1+ pitting edema Skin: No rashes, lesions or ulcers Psychiatry: Judgement and insight appear normal. Mood & affect appropriate.     Data Reviewed: I have personally reviewed following labs and imaging studies  CBC: Recent Labs  Lab 09/13/20 1548 09/15/20 0416  WBC 6.3 6.6  HGB 15.7* 14.5  HCT 50.6* 46.5*  MCV 88.2 88.4  PLT 156 371*   Basic Metabolic Panel: Recent Labs  Lab 09/13/20 1548 09/15/20 0416  NA 141 143  K 4.7 3.6  CL 104 103  CO2 24 32  GLUCOSE 101* 109*  BUN 35* 45*  CREATININE 1.55* 2.14*  CALCIUM 9.6 8.5*   GFR: Estimated Creatinine Clearance: 30.3 mL/min (A) (by C-G formula based on SCr of 2.14 mg/dL (H)). Liver Function Tests: No results for input(s): AST, ALT, ALKPHOS, BILITOT, PROT, ALBUMIN in the  last 168 hours. No results for input(s): LIPASE, AMYLASE in the last 168 hours. No results for input(s): AMMONIA in the last 168 hours. Coagulation Profile: Recent Labs  Lab 09/13/20 1647 09/15/20 0416  INR 2.6* 2.8*   Cardiac Enzymes: No results for input(s): CKTOTAL, CKMB, CKMBINDEX, TROPONINI in the last 168 hours. BNP (last 3 results) No results for input(s): PROBNP in the last 8760 hours. HbA1C: No results for input(s): HGBA1C in the last 72 hours. CBG: No results for input(s): GLUCAP in the last 168 hours. Lipid Profile: No results for input(s): CHOL, HDL, LDLCALC, TRIG, CHOLHDL, LDLDIRECT in the last 72 hours. Thyroid Function Tests: No results for input(s): TSH, T4TOTAL, FREET4, T3FREE, THYROIDAB in the last 72 hours. Anemia Panel:  No results for input(s): VITAMINB12, FOLATE, FERRITIN, TIBC, IRON, RETICCTPCT in the last 72 hours. Sepsis Labs: No results for input(s): PROCALCITON, LATICACIDVEN in the last 168 hours.  Recent Results (from the past 240 hour(s))  SARS Coronavirus 2 by RT PCR (hospital order, performed in [facility] hospital lab) Nasopharyngeal Nasopharyngeal Swab     Status: None   Collection Time: 09/14/20  5:21 AM   Specimen: Nasopharyngeal Swab  Result Value Ref Range Status   SARS Coronavirus 2 NEGATIVE NEGATIVE Final    Comment: (NOTE) SARS-CoV-2 target nucleic acids are NOT DETECTED.  The SARS-CoV-2 RNA is generally detectable in upper and lower respiratory specimens during the acute phase of infection. The lowest concentration of SARS-CoV-2 viral copies this assay can detect is 250 copies / mL. A negative result does not preclude SARS-CoV-2 infection and should not be used as the sole basis for treatment or other patient management decisions.  A negative result may occur with improper specimen collection / handling, submission of specimen other than nasopharyngeal swab, presence of viral mutation(s) within the areas targeted by this assay, and  inadequate number of viral copies (<250 copies / mL). A negative result must be combined with clinical observations, patient history, and epidemiological information.  Fact Sheet for Patients:   StrictlyIdeas.no  Fact Sheet for Healthcare Providers: BankingDealers.co.za  This test is not yet approved or  cleared by the [place] FDA and has been authorized for detection and/or diagnosis of SARS-CoV-2 by FDA under an Emergency Use Authorization (EUA).  This EUA will remain in effect (meaning this test can be used) for the duration of the COVID-19 declaration under Section 564(b)(1) of the Act, 21 U.S.C. section 360bbb-3(b)(1), unless the authorization is terminated or revoked sooner.  Performed at [facility], [place] [address], [place],  [address]          Radiology Studies: DG Chest 2 View  Result Date: 09/13/2020 CLINICAL DATA:  Chest pain and right upper quadrant pain five days. History of lung and colon cancer. EXAM: CHEST - 2 VIEW COMPARISON:  10/15/2019 and chest CT 08/01/2020 FINDINGS: Right-sided Port-A-Cath unchanged. Lungs are adequately inflated with surgical sutures over the upper lungs bilaterally. There is hazy prominence of the perihilar markings which may be due to mild vascular congestion. Mild stable cardiomegaly. Remainder of the exam is unchanged. IMPRESSION: 1. Suggestion of mild vascular congestion. Postsurgical change over the upper lungs. 2.  Mild stable cardiomegaly. Electronically Signed   By: Marin Olp M.D.   On: 09/13/2020 16:17   CT Angio Chest PE W/Cm &/Or Wo Cm  Result Date: 09/14/2020 CLINICAL DATA:  Chest pain EXAM: CT ANGIOGRAPHY CHEST WITH CONTRAST TECHNIQUE: Multidetector CT imaging of the chest was performed using the standard protocol during bolus administration of intravenous contrast. Multiplanar CT image reconstructions and MIPs were obtained to evaluate the  vascular anatomy. CONTRAST:  49mL OMNIPAQUE IOHEXOL 350 MG/ML SOLN COMPARISON:  08/01/2020 FINDINGS: Cardiovascular: There is adequate opacification of the pulmonary arterial tree through the segmental level. There is no intraluminal filling defect to suggest acute pulmonary embolism. The central pulmonary arteries are enlarged in keeping with changes of pulmonary arterial hypertension. There is extensive coronary artery calcification. Cardiac size is at the upper limits of normal. No pericardial effusion. Atherosclerotic calcification is seen within the thoracic aorta. No aortic aneurysm identified. Right internal jugular chest port is in place with its tip within the right atrium. Mediastinum/Nodes: No pathologic thoracic adenopathy. Esophagus unremarkable. Thyroid unremarkable. Lungs/Pleura: Surgical  changes of a partial right, superior segment right, and partial left upper lobectomy are again identified. Since the prior examination, there has developed mild smooth interlobular septal thickening as well as ground-glass pulmonary infiltrate suspicious for changes of mild to moderate interstitial pulmonary edema, possibly cardiogenic in nature. 12 mm right lower lobe subpleural ground-glass pulmonary nodule is again identified and is unchanged. No pneumothorax or pleural effusion. Central airways are patent. Upper Abdomen: No acute abnormality. Musculoskeletal: No chest wall abnormality. No acute or significant osseous findings. Review of the MIP images confirms the above findings. IMPRESSION: No pulmonary embolism. Interval development of mild to moderate pulmonary edema, possibly cardiogenic in nature. Extensive coronary artery calcification. Morphologic changes in keeping with pulmonary arterial hypertension. Stable 12 mm right lower lobe subpleural pulmonary nodule. Aortic Atherosclerosis (ICD10-I70.0). Electronically Signed   By: Fidela Salisbury MD   On: 09/14/2020 01:42   ECHOCARDIOGRAM COMPLETE  Result  Date: 09/14/2020    ECHOCARDIOGRAM REPORT   Patient Name:   QUETZALI HEINLE Koziel Date of Exam: 09/14/2020 Medical Rec #:  [id number]     Height:       71.0 in Accession #:    [id number]    Weight:       280.0 lb Date of Birth:  03/10/1941     BSA:          2.433 m Patient Age:    36 years      BP:           147/83 mmHg Patient Gender: F             HR:           71 bpm. Exam Location:  Inpatient Procedure: 2D Echo, Cardiac Doppler and Color Doppler Indications:    I50.33 Acute on chronic diastolic (congestive) heart failure  History:        Patient has prior history of Echocardiogram examinations, most                 recent 03/13/2012. Stroke and COPD, Arrythmias:Atrial                 Fibrillation, Signs/Symptoms:Dyspnea; Risk Factors:Hypertension                 and Dyslipidemia. Cancer. GERD.  Sonographer:    Jonelle Sidle Dance Referring Phys: [id number]  1. Left ventricular ejection fraction, by estimation, is 60 to 65%. The left ventricle has normal function. Left ventricular endocardial border not optimally defined to evaluate regional wall motion. There is mild left ventricular hypertrophy. Left ventricular diastolic parameters are indeterminate.  2. Right ventricular systolic function is normal. The right ventricular size is normal. There is normal pulmonary artery systolic pressure.  3. Left atrial size was mild to moderately dilated.  4. The mitral valve is normal in structure. Trivial mitral valve regurgitation. No evidence of mitral stenosis. Moderate mitral annular calcification.  5. The aortic valve is tricuspid. Aortic valve regurgitation is not visualized. Mild to moderate aortic valve sclerosis/calcification is present, without any evidence of aortic stenosis. Aortic valve mean gradient measures 8.0 mmHg. Aortic valve Vmax measures 1.95 m/s.  6. Pulmonic valve regurgitation is moderate.  7. The inferior vena cava is normal in size with greater than 50% respiratory variability, suggesting  right atrial pressure of 3 mmHg. FINDINGS  Left Ventricle: Left ventricular ejection fraction, by estimation, is 60 to 65%. The left ventricle has normal function. Left ventricular endocardial border not optimally defined to evaluate  regional wall motion. The left ventricular internal cavity size was normal in size. There is mild left ventricular hypertrophy. Left ventricular diastolic parameters are indeterminate. Right Ventricle: The right ventricular size is normal. No increase in right ventricular wall thickness. Right ventricular systolic function is normal. There is normal pulmonary artery systolic pressure. The tricuspid regurgitant velocity is 2.20 m/s, and  with an assumed right atrial pressure of 8 mmHg, the estimated right ventricular systolic pressure is 20.9 mmHg. Left Atrium: Left atrial size was mild to moderately dilated. Right Atrium: Right atrial size was normal in size. Pericardium: There is no evidence of pericardial effusion. Mitral Valve: The mitral valve is normal in structure. Moderate mitral annular calcification. Trivial mitral valve regurgitation. No evidence of mitral valve stenosis. Tricuspid Valve: The tricuspid valve is normal in structure. Tricuspid valve regurgitation is trivial. No evidence of tricuspid stenosis. Aortic Valve: The aortic valve is tricuspid. Aortic valve regurgitation is not visualized. Mild to moderate aortic valve sclerosis/calcification is present, without any evidence of aortic stenosis. Aortic valve mean gradient measures 8.0 mmHg. Aortic valve peak gradient measures 15.2 mmHg. Aortic valve area, by VTI measures 1.21 cm. Pulmonic Valve: The pulmonic valve was normal in structure. Pulmonic valve regurgitation is moderate. No evidence of pulmonic stenosis. Aorta: The aortic root is normal in size and structure. Venous: The inferior vena cava is normal in size with greater than 50% respiratory variability, suggesting right atrial pressure of 3 mmHg. IAS/Shunts: No  atrial level shunt detected by color flow Doppler.  LEFT VENTRICLE PLAX 2D LVIDd:         4.80 cm LVIDs:         3.30 cm LV PW:         1.40 cm LV IVS:        1.10 cm LVOT diam:     2.00 cm LV SV:         52 LV SV Index:   21 LVOT Area:     3.14 cm  RIGHT VENTRICLE            IVC RV S prime:     8.27 cm/s  IVC diam: 2.20 cm TAPSE (M-mode): 1.9 cm LEFT ATRIUM              Index LA diam:        5.20 cm  2.14 cm/m LA Vol (A2C):   110.0 ml 45.22 ml/m LA Vol (A4C):   67.6 ml  27.79 ml/m LA Biplane Vol: 90.1 ml  37.04 ml/m  AORTIC VALVE AV Area (Vmax):    1.17 cm AV Area (Vmean):   1.19 cm AV Area (VTI):     1.21 cm AV Vmax:           195.00 cm/s AV Vmean:          129.000 cm/s AV VTI:            0.430 m AV Peak Grad:      15.2 mmHg AV Mean Grad:      8.0 mmHg LVOT Vmax:         72.83 cm/s LVOT Vmean:        49.033 cm/s LVOT VTI:          0.166 m LVOT/AV VTI ratio: 0.39  AORTA Ao Root diam: 3.30 cm Ao Asc diam:  3.20 cm MITRAL VALVE                TRICUSPID VALVE MV Area (PHT): 3.99 cm  TR Peak grad:   19.4 mmHg MV Decel Time: 190 msec     TR Vmax:        220.00 cm/s MV E velocity: 108.00 cm/s MV A velocity: 49.40 cm/s   SHUNTS MV E/A ratio:  2.19         Systemic VTI:  0.17 m                             Systemic Diam: 2.00 cm Candee Furbish MD Electronically signed by Candee Furbish MD Signature Date/Time: 09/14/2020/10:04:22 AM    Final         Scheduled Meds: . amLODipine  10 mg Oral Daily  . beclomethasone  1 puff Inhalation BID  . Chlorhexidine Gluconate Cloth  6 each Topical Daily  . furosemide  40 mg Intravenous BID  . lidocaine  1 patch Transdermal Q24H  . metoprolol tartrate  100 mg Oral BID  . simvastatin  10 mg Oral Daily  . sodium chloride flush  3 mL Intravenous Q12H  . warfarin  2.5 mg Oral ONCE-1600  . Warfarin - Pharmacist Dosing Inpatient   Does not apply q1600   Continuous Infusions: . sodium chloride       LOS: 1 day     Georgette Shell, MD  09/15/2020, 12:44 PM

## 2020-09-15 NOTE — Evaluation (Signed)
Occupational Therapy Evaluation and Discharge Summary Patient Details Name: Tammie [name] MRN: [id number] DOB: 1941-02-03 Today's Date: 09/15/2020    History of Present Illness Pt admitted with c/o chest pain and dx with acute CHF and acute repiratory failure.  Pt with hx of CVA with memory deficits, COPD, Lung CA, Colon CA, and a-fib.   Clinical Impression   Pt admitted with the above diagnosis and overall is very close to baseline. Pt lives with a friend who works so she is home alone during the day. Pt is overall supervision to modified independent with all adls.  Do not feel pt needs further OT at this time but needs to continue to be active, get up and move around and do for herself when returning home.       Follow Up Recommendations  No OT follow up;Supervision - Intermittent    Equipment Recommendations  None recommended by OT    Recommendations for Other Services       Precautions / Restrictions Precautions Precautions: Fall Restrictions Weight Bearing Restrictions: No      Mobility Bed Mobility Overal bed mobility: Modified Independent             General bed mobility comments: Pt unassisted OOB  Transfers Overall transfer level: Modified independent Equipment used: None             General transfer comment: Pt not needing assist with anything other than IV pole    Balance Overall balance assessment: Needs assistance Sitting-balance support: No upper extremity supported;Feet supported Sitting balance-Leahy Scale: Normal     Standing balance support: No upper extremity supported Standing balance-Leahy Scale: Good                             ADL either performed or assessed with clinical judgement   ADL Overall ADL's : Needs assistance/impaired Eating/Feeding: Independent;Sitting   Grooming: Supervision/safety;Standing   Upper Body Bathing: Set up;Sitting   Lower Body Bathing: Supervison/ safety;Sit to/from stand   Upper Body  Dressing : Set up;Sitting   Lower Body Dressing: Supervision/safety;Sit to/from stand Lower Body Dressing Details (indicate cue type and reason): struggles getting socks and shoes on but can do it. Toilet Transfer: Supervision/safety;Ambulation;RW   Toileting- Clothing Manipulation and Hygiene: Supervision/safety;Sit to/from stand       Functional mobility during ADLs: Supervision/safety;Rolling walker General ADL Comments: Pt able to complete most adls without hands on assist. Pt states she feels close to her baseline level of functioning but is just tired and slightly weak.     Vision Baseline Vision/History: No visual deficits Patient Visual Report: No change from baseline Vision Assessment?: No apparent visual deficits     Perception Perception Perception Tested?: No   Praxis Praxis Praxis tested?: Within functional limits    Pertinent Vitals/Pain Pain Assessment: No/denies pain     Hand Dominance Right   Extremity/Trunk Assessment Upper Extremity Assessment Upper Extremity Assessment: LUE deficits/detail LUE Deficits / Details: WFL but swollen. Pt states the LUE swells more than RUE since CVA. LUE Sensation: WNL LUE Coordination: WNL   Lower Extremity Assessment Lower Extremity Assessment: Defer to PT evaluation   Cervical / Trunk Assessment Cervical / Trunk Assessment: Normal   Communication Communication Communication: HOH   Cognition Arousal/Alertness: Awake/alert Behavior During Therapy: WFL for tasks assessed/performed;Impulsive Overall Cognitive Status: History of cognitive impairments - at baseline  General Comments: Pt with poor STM. States this has been a problem since her CVA years ago.   General Comments  Pt very close to baseline functioning.  Pt does not need or want HHOT.    Exercises     Shoulder Instructions      Home Living Family/patient expects to be discharged to:: Private  residence Living Arrangements: Non-relatives/Friends Available Help at Discharge: Family Type of Home: House Home Access: Stairs to enter     Home Layout: One level     Bathroom Shower/Tub: Walk-in shower;Door   [employer] Toilet: Standard     Home Equipment: Environmental consultant - 4 wheels;Cane - single point;Bedside commode   Additional Comments: Pt has a 3:1 but does not use it because she cant wipe herself well when it is on the commode. She states she does fine without it.      Prior Functioning/Environment Level of Independence: Independent with assistive device(s)        Comments: cane as needed. Drives but not much due to memory deficits. Used to clean home but said it is a mess because she feels she cannot clean it well anymore.        OT Problem List:        OT Treatment/Interventions:      OT Goals(Current goals can be found in the care plan section) Acute Rehab OT Goals Patient Stated Goal: Go Home OT Goal Formulation: All assessment and education complete, DC therapy  OT Frequency:     Barriers to D/C:            Co-evaluation              AM-PAC OT "6 Clicks" Daily Activity     Outcome Measure Help from another person eating meals?: None Help from another person taking care of personal grooming?: None Help from another person toileting, which includes using toliet, bedpan, or urinal?: None Help from another person bathing (including washing, rinsing, drying)?: None Help from another person to put on and taking off regular upper body clothing?: None Help from another person to put on and taking off regular lower body clothing?: None 6 Click Score: 24   End of Session Nurse Communication: Mobility status  Activity Tolerance: Patient tolerated treatment well Patient left:    OT Visit Diagnosis: Unsteadiness on feet (R26.81)                Time: 1020-1035 OT Time Calculation (min): 15 min Charges:  OT General Charges $OT Visit: 1 Visit OT  Evaluation $OT Eval Low Complexity: 1 Low  [name] Peers 09/15/2020, 11:18 AM
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
## 2020-09-16 ENCOUNTER — Inpatient Hospital Stay (HOSPITAL_COMMUNITY): Payer: PPO

## 2020-09-16 LAB — BASIC METABOLIC PANEL
Anion gap: 12 (ref 5–15)
BUN: 43 mg/dL — ABNORMAL HIGH (ref 8–23)
CO2: 30 mmol/L (ref 22–32)
Calcium: 8.6 mg/dL — ABNORMAL LOW (ref 8.9–10.3)
Chloride: 101 mmol/L (ref 98–111)
Creatinine, Ser: 1.68 mg/dL — ABNORMAL HIGH (ref 0.44–1.00)
GFR calc Af Amer: 33 mL/min — ABNORMAL LOW (ref >60)
GFR calc non Af Amer: 29 mL/min — ABNORMAL LOW (ref >60)
Glucose, Bld: 96 mg/dL (ref 70–99)
Potassium: 3.6 mmol/L (ref 3.5–5.1)
Sodium: 143 mmol/L (ref 135–145)

## 2020-09-16 LAB — PROTIME-INR
INR: 2.6 — ABNORMAL HIGH (ref 0.8–1.2)
Prothrombin Time: 27 seconds — ABNORMAL HIGH (ref 11.4–15.2)

## 2020-09-16 MED ORDER — FUROSEMIDE 20 MG PO TABS: 20.0000 mg | ORAL_TABLET | Freq: Every day | ORAL | 4 refills | Status: DC

## 2020-09-16 MED ORDER — HEPARIN SOD (PORK) LOCK FLUSH 100 UNIT/ML IV SOLN
500.0000 [IU] | INTRAVENOUS | Status: AC | PRN
  Administered 2020-09-16: 500 [IU]
  Filled 2020-09-16: qty 5

## 2020-09-16 MED ORDER — POTASSIUM CHLORIDE ER 10 MEQ PO TBCR: 10.0000 meq | EXTENDED_RELEASE_TABLET | Freq: Every day | ORAL | 4 refills | Status: DC

## 2020-09-16 MED ORDER — WARFARIN SODIUM 2.5 MG PO TABS: 2.5000 mg | ORAL_TABLET | Freq: Once | ORAL | Status: DC

## 2020-09-16 NOTE — Discharge Summary (Signed)
Physician Discharge Summary  Tammie [name] SEG:[id number] DOB: [birth year] DOA: 09/13/2020  PCP: [name], FNP  Admit date: 09/13/2020 Discharge date: 09/16/2020  Admitted From:home Disposition: home Recommendations for Outpatient Follow-up:  1. Follow up with PCP in 1-2 weeks 2. Please obtain BMP/CBC in one week 3. NEW MEDICINE STARTED LASIX AND KDUR 4. FOLLOW UP WITH CARDIOLOGIST AT [facility]:none Equipment/Devices:NONE  Discharge Condition:stable CODE STATUS:full Diet recommendation: cardiac Brief/Interim Summary:Tammie M Garneris a 79 y.o.femalewith medical history significant forlung cancer, colon cancer, atrial fibrillation on warfarin, COPD, and BMI 39, now presenting to the emergency department for evaluation of worsening shortness of breath and pain under her right arm. Patient reports that she has had progressive increase in her chronic dyspnea over the past week or so, worse at night when she tries to lay down, but without fevers, chills, or increase in her chronic cough. She also reports some pain near the right axilla that is intermittent, worse if she lays on her left side, aching in character, and without any inciting event or alleviating or exacerbating factors. She does not have that right sided pain currently.  ED Course:Upon arrival to the ED, patient is found to be afebrile, saturating 87% on room air, tachypneic, and with stable blood pressure. EKG features atrial fibrillation. CTA chest is negative for PE but concerning for mild to moderate pulmonary edema. Chemistry panel was notable for creatinine 1.55 which is similar to priors. CBC with mild polycythemia. INR is therapeutic at 2.6. BNP elevated 175. High-sensitivity troponin normal x2. Patient was started on supplemental oxygen and treated with albuterol and IV Lasix in the ED.   Discharge Diagnoses:  Principal Problem:   Acute on chronic diastolic CHF (congestive heart failure)  (HCC) Active Problems:   Atrial fibrillation (HCC)   COPD with chronic bronchitis (HCC)   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 (HCC)   CKD (chronic kidney disease), stage III   Primary adenocarcinoma of colon ([place])   Acute respiratory failure with hypoxia ([place])   #1 acute hypoxic respiratory failure-secondary to acute pulmonary edema.  Chest x-ray shows findings consistent with mild pulmonary edema, CT chest shows pulmonary edema with no evidence of PE.  However echocardiogram shows normal systolic function no mention of diastolic dysfunction.  Normal ejection fraction.   Patient was hypoxic multiple times on room air.  87% on room air in the ER and 83% on room air on the floor. She ambulated again on the day of dc saturation maintained above 90% on room air with ambulation. She was treated with Lasix with good diuresis.  She will be discharged home on Lasix 20 mg daily with potassium 10 mEq daily.  She reported to me that she has a cardiologist in [place] and she would like to follow-up with him.  However she also reports she does not want any invasive cardiac work-up.  She continues to have the pain in the right shoulder and the right chest which is nonreproducible.  She describes the pain as achy pain all the time.  She denies falling she denies hitting her chest or shoulder anywhere.  She denies lifting heavy objects.  #2 history of chronic COPD/lung cancer-patient is not actively wheezing continue as needed albuterol.  #3 history of chronic atrial fibrillation on Coumadin at home.  Continue metoprolol for rate control.  INR is therapeutic.  2.8 continue Coumadin 2.5 mg daily.  #4 acquired thrombophilia with A. fib and increased risk for stroke on Coumadin.  #  5 CKD stage IIIb monitor closely on diuretics.  Creatinine 1.6 on the day of discharge.  With diuresis her creatinine had trended up to 2.1 during his hospital stay.  #6 history of colon cancer needs outpatient oncology  follow-up.  Estimated body mass index is 37.36 kg/m as calculated from the following:   Height as of this encounter: 5\' 11"  (1.803 m).   Weight as of this encounter: 121.5 kg.  Discharge Instructions  Discharge Instructions    Avoid straining   Complete by: As directed    Call MD for:  difficulty breathing, headache or visual disturbances   Complete by: As directed    Call MD for:  extreme fatigue   Complete by: As directed    Call MD for:  persistant dizziness or light-headedness   Complete by: As directed    Call MD for:  persistant nausea and vomiting   Complete by: As directed    Call MD for:  severe uncontrolled pain   Complete by: As directed    Call MD for:  temperature >100.4   Complete by: As directed    Diet - low sodium heart healthy   Complete by: As directed    Heart Failure patients record your daily weight using the same scale at the same time of day   Complete by: As directed    Increase activity slowly   Complete by: As directed    STOP any activity that causes chest pain, shortness of breath, dizziness, sweating, or exessive weakness   Complete by: As directed      Allergies as of 09/16/2020      Reactions   Tape Hives   Contrast Media [iodinated Diagnostic Agents] Hives   Iohexol Other (See Comments)    Code: HIVES, Desc: hives w/ contrast '08// better w/ benadryl   Sulfa Antibiotics Other (See Comments)   REACTION: unknown   Sulfamethoxazole-trimethoprim Other (See Comments)   REACTION: unknown      Medication List    TAKE these medications   acetaminophen 650 MG CR tablet Commonly known as: TYLENOL Take 650 mg by mouth 3 (three) times daily as needed for pain.   albuterol 108 (90 Base) MCG/ACT inhaler Commonly known as: ProAir HFA Inhale 2 puffs into the lungs every 6 (six) hours as needed for wheezing or shortness of breath.   amLODipine 10 MG tablet Commonly known as: NORVASC Take 1 tablet (10 mg total) by mouth daily. (Needs to be seen  before next refill)   beclomethasone 40 MCG/ACT inhaler Commonly known as: QVAR Inhale 1 puff into the lungs 2 (two) times daily.   diphenhydrAMINE 25 MG tablet Commonly known as: BENADRYL Take 25 mg by mouth every 6 (six) hours as needed for allergies.   Fish Oil 1000 MG Caps Take 1 capsule by mouth daily.   FLAXSEED OIL PO Take 1 tablet by mouth at bedtime.   furosemide 20 MG tablet Commonly known as: Lasix Take 1 tablet (20 mg total) by mouth daily.   Garlic 6962 MG Caps Take 1,000 mg by mouth 2 (two) times daily.   Iron 142 (45 Fe) MG Tbcr Take 1 tablet by mouth daily.   metoprolol tartrate 100 MG tablet Commonly known as: LOPRESSOR TAKE 1 & 1/2 (ONE & ONE-HALF) TABLETS BY MOUTH TWICE DAILY (Needs to be seen before next refill)   potassium chloride 10 MEQ tablet Commonly known as: KLOR-CON Take 1 tablet (10 mEq total) by mouth daily.   simvastatin 10 MG  tablet Commonly known as: ZOCOR Take 1 tablet (10 mg total) by mouth daily. (Needs to be seen before next refill)   warfarin 2.5 MG tablet Commonly known as: COUMADIN Take as directed. If you are unsure how to take this medication, talk to your nurse or doctor. Original instructions: TAKE 1 TABLET BY MOUTH ONCE DAILY, TAKE 1/2 TABLET ON FRIDAYS (Needs to be seen before next refill) What changed:   how much to take  how to take this  when to take this  additional instructions       Follow-up Information    [name], FNP Follow up.   Specialty: Family Medicine Contact information: [place] [address] [phone]              Allergies  Allergen Reactions  . Tape Hives  . Contrast Media [Iodinated Diagnostic Agents] Hives  . Iohexol Other (See Comments)     Code: HIVES, Desc: hives w/ contrast '08// better w/ benadryl   . Sulfa Antibiotics Other (See Comments)    REACTION: unknown  . Sulfamethoxazole-Trimethoprim Other (See Comments)    REACTION: unknown     Consultations:  None   Procedures/Studies: DG Chest 1 View  Result Date: 09/16/2020 CLINICAL DATA:  Dyspnea, chest pain EXAM: CHEST  1 VIEW COMPARISON:  09/13/2020 FINDINGS: Lungs are clear. No pneumothorax or pleural effusion. Right internal jugular chest port is seen with its tip within the right atrium. Cardiac size is within normal limits. Staple lines related to partial lung resection are again identified within the hila bilaterally. No acute bone abnormality. IMPRESSION: No active disease. Electronically Signed   By: Fidela Salisbury MD   On: 09/16/2020 05:38   DG Chest 2 View  Result Date: 09/13/2020 CLINICAL DATA:  Chest pain and right upper quadrant pain five days. History of lung and colon cancer. EXAM: CHEST - 2 VIEW COMPARISON:  10/15/2019 and chest CT 08/01/2020 FINDINGS: Right-sided Port-A-Cath unchanged. Lungs are adequately inflated with surgical sutures over the upper lungs bilaterally. There is hazy prominence of the perihilar markings which may be due to mild vascular congestion. Mild stable cardiomegaly. Remainder of the exam is unchanged. IMPRESSION: 1. Suggestion of mild vascular congestion. Postsurgical change over the upper lungs. 2.  Mild stable cardiomegaly. Electronically Signed   By: Marin Olp M.D.   On: 09/13/2020 16:17   DG Shoulder 1V Right  Result Date: 09/15/2020 CLINICAL DATA:  Pain EXAM: RIGHT SHOULDER - 1 VIEW COMPARISON:  None. FINDINGS: Y scapular image obtained. No fracture or dislocation evident. No appreciable joint space narrowing or erosion on single view. Visualized right lung shows foci of postoperative change. IMPRESSION: On Y scapular image, no fracture or dislocation. No appreciable arthropathic change. Electronically Signed   By: Lowella Grip III M.D.   On: 09/15/2020 14:23   CT Angio Chest PE W/Cm &/Or Wo Cm  Result Date: 09/14/2020 CLINICAL DATA:  Chest pain EXAM: CT ANGIOGRAPHY CHEST WITH CONTRAST TECHNIQUE: Multidetector CT imaging  of the chest was performed using the standard protocol during bolus administration of intravenous contrast. Multiplanar CT image reconstructions and MIPs were obtained to evaluate the vascular anatomy. CONTRAST:  77mL OMNIPAQUE IOHEXOL 350 MG/ML SOLN COMPARISON:  08/01/2020 FINDINGS: Cardiovascular: There is adequate opacification of the pulmonary arterial tree through the segmental level. There is no intraluminal filling defect to suggest acute pulmonary embolism. The central pulmonary arteries are enlarged in keeping with changes of pulmonary arterial hypertension. There is extensive coronary artery calcification.  Cardiac size is at the upper limits of normal. No pericardial effusion. Atherosclerotic calcification is seen within the thoracic aorta. No aortic aneurysm identified. Right internal jugular chest port is in place with its tip within the right atrium. Mediastinum/Nodes: No pathologic thoracic adenopathy. Esophagus unremarkable. Thyroid unremarkable. Lungs/Pleura: Surgical changes of a partial right, superior segment right, and partial left upper lobectomy are again identified. Since the prior examination, there has developed mild smooth interlobular septal thickening as well as ground-glass pulmonary infiltrate suspicious for changes of mild to moderate interstitial pulmonary edema, possibly cardiogenic in nature. 12 mm right lower lobe subpleural ground-glass pulmonary nodule is again identified and is unchanged. No pneumothorax or pleural effusion. Central airways are patent. Upper Abdomen: No acute abnormality. Musculoskeletal: No chest wall abnormality. No acute or significant osseous findings. Review of the MIP images confirms the above findings. IMPRESSION: No pulmonary embolism. Interval development of mild to moderate pulmonary edema, possibly cardiogenic in nature. Extensive coronary artery calcification. Morphologic changes in keeping with pulmonary arterial hypertension. Stable 12 mm right  lower lobe subpleural pulmonary nodule. Aortic Atherosclerosis (ICD10-I70.0). Electronically Signed   By: Fidela Salisbury MD   On: 09/14/2020 01:42   ECHOCARDIOGRAM COMPLETE  Result Date: 09/14/2020    ECHOCARDIOGRAM REPORT   Patient Name:   HAWRAA STAMBAUGH Clos Date of Exam: 09/14/2020 Medical Rec #:  [id number]     Height:       71.0 in Accession #:    [id number]    Weight:       280.0 lb Date of Birth:  [birth year]     BSA:          2.433 m Patient Age:    63 years      BP:           147/83 mmHg Patient Gender: F             HR:           71 bpm. Exam Location:  Inpatient Procedure: 2D Echo, Cardiac Doppler and Color Doppler Indications:    I50.33 Acute on chronic diastolic (congestive) heart failure  History:        Patient has prior history of Echocardiogram examinations, most                 recent 03/13/2012. Stroke and COPD, Arrythmias:Atrial                 Fibrillation, Signs/Symptoms:Dyspnea; Risk Factors:Hypertension                 and Dyslipidemia. Cancer. GERD.  Sonographer:    Jonelle Sidle Dance Referring Phys: [id number] [place]  1. Left ventricular ejection fraction, by estimation, is 60 to 65%. The left ventricle has normal function. Left ventricular endocardial border not optimally defined to evaluate regional wall motion. There is mild left ventricular hypertrophy. Left ventricular diastolic parameters are indeterminate.  2. Right ventricular systolic function is normal. The right ventricular size is normal. There is normal pulmonary artery systolic pressure.  3. Left atrial size was mild to moderately dilated.  4. The mitral valve is normal in structure. Trivial mitral valve regurgitation. No evidence of mitral stenosis. Moderate mitral annular calcification.  5. The aortic valve is tricuspid. Aortic valve regurgitation is not visualized. Mild to moderate aortic valve sclerosis/calcification is present, without any evidence of aortic stenosis. Aortic valve mean gradient measures 8.0 mmHg.  Aortic valve Vmax measures 1.95 m/s.  6. Pulmonic valve regurgitation is moderate.  7.  The inferior vena cava is normal in size with greater than 50% respiratory variability, suggesting right atrial pressure of 3 mmHg. FINDINGS  Left Ventricle: Left ventricular ejection fraction, by estimation, is 60 to 65%. The left ventricle has normal function. Left ventricular endocardial border not optimally defined to evaluate regional wall motion. The left ventricular internal cavity size was normal in size. There is mild left ventricular hypertrophy. Left ventricular diastolic parameters are indeterminate. Right Ventricle: The right ventricular size is normal. No increase in right ventricular wall thickness. Right ventricular systolic function is normal. There is normal pulmonary artery systolic pressure. The tricuspid regurgitant velocity is 2.20 m/s, and  with an assumed right atrial pressure of 8 mmHg, the estimated right ventricular systolic pressure is 67.8 mmHg. Left Atrium: Left atrial size was mild to moderately dilated. Right Atrium: Right atrial size was normal in size. Pericardium: There is no evidence of pericardial effusion. Mitral Valve: The mitral valve is normal in structure. Moderate mitral annular calcification. Trivial mitral valve regurgitation. No evidence of mitral valve stenosis. Tricuspid Valve: The tricuspid valve is normal in structure. Tricuspid valve regurgitation is trivial. No evidence of tricuspid stenosis. Aortic Valve: The aortic valve is tricuspid. Aortic valve regurgitation is not visualized. Mild to moderate aortic valve sclerosis/calcification is present, without any evidence of aortic stenosis. Aortic valve mean gradient measures 8.0 mmHg. Aortic valve peak gradient measures 15.2 mmHg. Aortic valve area, by VTI measures 1.21 cm. Pulmonic Valve: The pulmonic valve was normal in structure. Pulmonic valve regurgitation is moderate. No evidence of pulmonic stenosis. Aorta: The aortic root  is normal in size and structure. Venous: The inferior vena cava is normal in size with greater than 50% respiratory variability, suggesting right atrial pressure of 3 mmHg. IAS/Shunts: No atrial level shunt detected by color flow Doppler.  LEFT VENTRICLE PLAX 2D LVIDd:         4.80 cm LVIDs:         3.30 cm LV PW:         1.40 cm LV IVS:        1.10 cm LVOT diam:     2.00 cm LV SV:         52 LV SV Index:   21 LVOT Area:     3.14 cm  RIGHT VENTRICLE            IVC RV S prime:     8.27 cm/s  IVC diam: 2.20 cm TAPSE (M-mode): 1.9 cm LEFT ATRIUM              Index LA diam:        5.20 cm  2.14 cm/m LA Vol (A2C):   110.0 ml 45.22 ml/m LA Vol (A4C):   67.6 ml  27.79 ml/m LA Biplane Vol: 90.1 ml  37.04 ml/m  AORTIC VALVE AV Area (Vmax):    1.17 cm AV Area (Vmean):   1.19 cm AV Area (VTI):     1.21 cm AV Vmax:           195.00 cm/s AV Vmean:          129.000 cm/s AV VTI:            0.430 m AV Peak Grad:      15.2 mmHg AV Mean Grad:      8.0 mmHg LVOT Vmax:         72.83 cm/s LVOT Vmean:        49.033 cm/s LVOT VTI:  0.166 m LVOT/AV VTI ratio: 0.39  AORTA Ao Root diam: 3.30 cm Ao Asc diam:  3.20 cm MITRAL VALVE                TRICUSPID VALVE MV Area (PHT): 3.99 cm     TR Peak grad:   19.4 mmHg MV Decel Time: 190 msec     TR Vmax:        220.00 cm/s MV E velocity: 108.00 cm/s MV A velocity: 49.40 cm/s   SHUNTS MV E/A ratio:  2.19         Systemic VTI:  0.17 m                             Systemic Diam: 2.00 cm Candee Furbish MD Electronically signed by Candee Furbish MD Signature Date/Time: 09/14/2020/10:04:22 AM    Final     (Echo, Carotid, EGD, Colonoscopy, ERCP)    Subjective:  Patient resting in bed continues to complain of right shoulder and right upper chest discomfort and achy pain x-rays negative for fractures CT of the chest no evidence of PE but showed pulmonary edema Discharge Exam: Vitals:   09/16/20 0849 09/16/20 0903  BP: 115/72   Pulse: 60   Resp: 16   Temp: 98.5 F (36.9 C)   SpO2:  94% 90%   Vitals:   09/16/20 0458 09/16/20 0500 09/16/20 0849 09/16/20 0903  BP: (!) 143/68  115/72   Pulse: 63  60   Resp: 20  16   Temp: 97.8 F (36.6 C)  98.5 F (36.9 C)   TempSrc: Oral  Oral   SpO2: 94%  94% 90%  Weight:  121.5 kg    Height:        General: Pt is alert, awake, not in acute distress Cardiovascular: RRR, S1/S2 +, no rubs, no gallops Respiratory: Few crackles at the bases bilaterally, no wheezing, no rhonchi Abdominal: Soft, NT, ND, bowel sounds + Extremities: no edema, no cyanosis    The results of significant diagnostics from this hospitalization (including imaging, microbiology, ancillary and laboratory) are listed below for reference.     Microbiology: Recent Results (from the past 240 hour(s))  SARS Coronavirus 2 by RT PCR (hospital order, performed in [facility] hospital lab) Nasopharyngeal Nasopharyngeal Swab     Status: None   Collection Time: 09/14/20  5:21 AM   Specimen: Nasopharyngeal Swab  Result Value Ref Range Status   SARS Coronavirus 2 NEGATIVE NEGATIVE Final    Comment: (NOTE) SARS-CoV-2 target nucleic acids are NOT DETECTED.  The SARS-CoV-2 RNA is generally detectable in upper and lower respiratory specimens during the acute phase of infection. The lowest concentration of SARS-CoV-2 viral copies this assay can detect is 250 copies / mL. A negative result does not preclude SARS-CoV-2 infection and should not be used as the sole basis for treatment or other patient management decisions.  A negative result may occur with improper specimen collection / handling, submission of specimen other than nasopharyngeal swab, presence of viral mutation(s) within the areas targeted by this assay, and inadequate number of viral copies (<250 copies / mL). A negative result must be combined with clinical observations, patient history, and epidemiological information.  Fact Sheet for Patients:   StrictlyIdeas.no  Fact  Sheet for Healthcare Providers: BankingDealers.co.za  This test is not yet approved or  cleared by the [place] FDA and has been authorized for detection and/or diagnosis of SARS-CoV-2 by FDA  under an Emergency Use Authorization (EUA).  This EUA will remain in effect (meaning this test can be used) for the duration of the COVID-19 declaration under Section 564(b)(1) of the Act, 21 U.S.C. section 360bbb-3(b)(1), unless the authorization is terminated or revoked sooner.  Performed at [facility], [place] [address], [place], [place] [address]      Labs: BNP (last 3 results) Recent Labs    09/13/20 2025  BNP 258.5*   Basic Metabolic Panel: Recent Labs  Lab 09/13/20 1548 09/15/20 0416 09/16/20 0429  NA 141 143 143  K 4.7 3.6 3.6  CL 104 103 101  CO2 24 32 30  GLUCOSE 101* 109* 96  BUN 35* 45* 43*  CREATININE 1.55* 2.14* 1.68*  CALCIUM 9.6 8.5* 8.6*   Liver Function Tests: No results for input(s): AST, ALT, ALKPHOS, BILITOT, PROT, ALBUMIN in the last 168 hours. No results for input(s): LIPASE, AMYLASE in the last 168 hours. No results for input(s): AMMONIA in the last 168 hours. CBC: Recent Labs  Lab 09/13/20 1548 09/15/20 0416  WBC 6.3 6.6  HGB 15.7* 14.5  HCT 50.6* 46.5*  MCV 88.2 88.4  PLT 156 147*   Cardiac Enzymes: No results for input(s): CKTOTAL, CKMB, CKMBINDEX, TROPONINI in the last 168 hours. BNP: Invalid input(s): POCBNP CBG: No results for input(s): GLUCAP in the last 168 hours. D-Dimer No results for input(s): DDIMER in the last 72 hours. Hgb A1c No results for input(s): HGBA1C in the last 72 hours. Lipid Profile No results for input(s): CHOL, HDL, LDLCALC, TRIG, CHOLHDL, LDLDIRECT in the last 72 hours. Thyroid function studies No results for input(s): TSH, T4TOTAL, T3FREE, THYROIDAB in the last 72 hours.  Invalid input(s): FREET3 Anemia work up No results for input(s): VITAMINB12, FOLATE,  FERRITIN, TIBC, IRON, RETICCTPCT in the last 72 hours. Urinalysis    Component Value Date/Time   COLORURINE YELLOW 10/15/2019 1945   APPEARANCEUR Clear 10/26/2019 1301   LABSPEC 1.016 10/15/2019 1945   LABSPEC 1.020 03/03/2017 1501   PHURINE 5.0 10/15/2019 1945   GLUCOSEU Negative 10/26/2019 1301   GLUCOSEU Negative 03/03/2017 1501   HGBUR SMALL (A) 10/15/2019 1945   BILIRUBINUR Negative 10/26/2019 1301   BILIRUBINUR Negative 03/03/2017 1501   KETONESUR 5 (A) 10/15/2019 1945   PROTEINUR Negative 10/26/2019 1301   PROTEINUR 100 (A) 10/15/2019 1945   UROBILINOGEN 0.2 03/03/2017 1501   NITRITE Negative 10/26/2019 1301   NITRITE NEGATIVE 10/15/2019 1945   LEUKOCYTESUR Trace (A) 10/26/2019 1301   LEUKOCYTESUR NEGATIVE 10/15/2019 1945   LEUKOCYTESUR Small 03/03/2017 1501   Sepsis Labs Invalid input(s): PROCALCITONIN,  WBC,  LACTICIDVEN Microbiology Recent Results (from the past 240 hour(s))  SARS Coronavirus 2 by RT PCR (hospital order, performed in [facility] hospital lab) Nasopharyngeal Nasopharyngeal Swab     Status: None   Collection Time: 09/14/20  5:21 AM   Specimen: Nasopharyngeal Swab  Result Value Ref Range Status   SARS Coronavirus 2 NEGATIVE NEGATIVE Final    Comment: (NOTE) SARS-CoV-2 target nucleic acids are NOT DETECTED.  The SARS-CoV-2 RNA is generally detectable in upper and lower respiratory specimens during the acute phase of infection. The lowest concentration of SARS-CoV-2 viral copies this assay can detect is 250 copies / mL. A negative result does not preclude SARS-CoV-2 infection and should not be used as the sole basis for treatment or other patient management decisions.  A negative result may occur with improper specimen collection / handling, submission of specimen other than nasopharyngeal swab, presence  of viral mutation(s) within the areas targeted by this assay, and inadequate number of viral copies (<250 copies / mL). A negative result must be  combined with clinical observations, patient history, and epidemiological information.  Fact Sheet for Patients:   StrictlyIdeas.no  Fact Sheet for Healthcare Providers: BankingDealers.co.za  This test is not yet approved or  cleared by the [place] FDA and has been authorized for detection and/or diagnosis of SARS-CoV-2 by FDA under an Emergency Use Authorization (EUA).  This EUA will remain in effect (meaning this test can be used) for the duration of the COVID-19 declaration under Section 564(b)(1) of the Act, 21 U.S.C. section 360bbb-3(b)(1), unless the authorization is terminated or revoked sooner.  Performed at [facility], [place] [address], [place], [place] [address]      Time coordinating discharge: 38 minutes  SIGNED:   Georgette Shell, MD  [facility] 09/16/2020, 9:23 AM

## 2020-09-16 NOTE — Progress Notes (Signed)
Patient was discharged. Patient was alert and oriented with no signs of distress. Patient's O2 saturation was 94% on room air.     Iva Lento RN

## 2020-09-16 NOTE — Progress Notes (Signed)
SATURATION QUALIFICATIONS: (This note is used to comply with regulatory documentation for home oxygen)  Patient Saturations on Room Air at Rest = 94%  Patient Saturations on Room Air while Ambulating = 89-100%  Patient Saturations on 0 Liters of oxygen while Ambulating =89-100%  Please briefly explain why patient needs home oxygen:
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
## 2020-09-24 ENCOUNTER — Other Ambulatory Visit: Payer: Self-pay | Admitting: Family

## 2020-09-24 DIAGNOSIS — I4891 Unspecified atrial fibrillation: Secondary | ICD-10-CM

## 2020-09-29 ENCOUNTER — Other Ambulatory Visit: Payer: Self-pay

## 2020-09-29 ENCOUNTER — Encounter: Payer: Self-pay | Admitting: Family

## 2020-09-29 ENCOUNTER — Ambulatory Visit (INDEPENDENT_AMBULATORY_CARE_PROVIDER_SITE_OTHER): Payer: PPO | Admitting: Family

## 2020-09-29 VITALS — BP 113/60 | HR 60 | Temp 97.4°F | Ht 71.0 in | Wt 269.0 lb

## 2020-09-29 DIAGNOSIS — I5033 Acute on chronic diastolic (congestive) heart failure: Secondary | ICD-10-CM

## 2020-09-29 DIAGNOSIS — N1832 Chronic kidney disease, stage 3b: Secondary | ICD-10-CM | POA: Diagnosis not present

## 2020-09-29 DIAGNOSIS — Z23 Encounter for immunization: Secondary | ICD-10-CM

## 2020-09-29 DIAGNOSIS — I10 Essential (primary) hypertension: Secondary | ICD-10-CM

## 2020-09-29 DIAGNOSIS — Z1159 Encounter for screening for other viral diseases: Secondary | ICD-10-CM | POA: Diagnosis not present

## 2020-09-29 DIAGNOSIS — B372 Candidiasis of skin and nail: Secondary | ICD-10-CM

## 2020-09-29 DIAGNOSIS — I4891 Unspecified atrial fibrillation: Secondary | ICD-10-CM | POA: Diagnosis not present

## 2020-09-29 DIAGNOSIS — Z09 Encounter for follow-up examination after completed treatment for conditions other than malignant neoplasm: Secondary | ICD-10-CM | POA: Diagnosis not present

## 2020-09-29 LAB — COAGUCHEK XS/INR WAIVED
INR: 3.3 — ABNORMAL HIGH (ref 0.9–1.1)
Prothrombin Time: 39.4 s

## 2020-09-29 MED ORDER — METOPROLOL TARTRATE 100 MG PO TABS: ORAL_TABLET | ORAL | 2 refills | Status: DC

## 2020-09-29 MED ORDER — KETOCONAZOLE 2 % EX GEL: 1.0000 "application " | Freq: Two times a day (BID) | CUTANEOUS | 2 refills | Status: DC

## 2020-09-29 MED ORDER — FLUCONAZOLE 150 MG PO TABS: 150.0000 mg | ORAL_TABLET | ORAL | 0 refills | Status: DC | PRN

## 2020-09-29 NOTE — Progress Notes (Signed)
Subjective:    Patient ID: Tammie [name], female    DOB: November 17, 1941, 79 y.o.   MRN: [id number]  Chief Complaint  Patient presents with  . Hospitalization Follow-up   Pt presents to the office today for hospital follow up. She went to the ED on 09/13/20 for SOB and was diagnosed with CHF. She was admitted. Had a negative CTA for PE, but showed moderate pulmonary edema.  She was given IV lasix. She was suppose to follow up with her Cardiologists, but needs a new referral.   She was discharged on Lasix 20 mg daily and potassium 10 meq.   She also has A Fib and takes warfarin for that.  Congestive Heart Failure Presents for follow-up visit. Associated symptoms include edema, fatigue, orthopnea and shortness of breath. Pertinent negatives include no muscle weakness. The symptoms have been improving.  Rash This is a new problem. The current episode started 1 to 4 weeks ago. The problem has been gradually worsening since onset. Location: under bilateral brease and axilla. The rash is characterized by redness. Associated symptoms include fatigue and shortness of breath.      Review of Systems  Constitutional: Positive for fatigue.  Respiratory: Positive for shortness of breath.   Musculoskeletal: Negative for muscle weakness.  Skin: Positive for rash.  All other systems reviewed and are negative.      Objective:   Physical Exam Vitals reviewed.  Constitutional:      General: She is not in acute distress.    Appearance: She is well-developed. She is obese.  HENT:     Head: Normocephalic and atraumatic.     Right Ear: Tympanic membrane normal.     Left Ear: Tympanic membrane normal.  Eyes:     Pupils: Pupils are equal, round, and reactive to light.  Neck:     Thyroid: No thyromegaly.  Cardiovascular:     Rate and Rhythm: Normal rate and regular rhythm.     Heart sounds: Normal heart sounds. No murmur heard.   Pulmonary:     Effort: Pulmonary effort is normal. No respiratory  distress.     Breath sounds: Normal breath sounds. No wheezing.  Abdominal:     General: Bowel sounds are normal. There is no distension.     Palpations: Abdomen is soft.     Tenderness: There is no abdominal tenderness.  Musculoskeletal:        General: No tenderness. Normal range of motion.     Cervical back: Normal range of motion and neck supple.  Skin:    General: Skin is warm and dry.     Findings: Erythema present.     Comments: Erythemas rash under bilateral breast and axilla   Neurological:     Mental Status: She is alert and oriented to person, place, and time.     Cranial Nerves: No cranial nerve deficit.     Deep Tendon Reflexes: Reflexes are normal and symmetric.  Psychiatric:        Behavior: Behavior normal.        Thought Content: Thought content normal.        Judgment: Judgment normal.      BP 113/60   Pulse 60   Temp (!) 97.4 F (36.3 C) (Temporal)   Ht '5\' 11"'  (1.803 m)   Wt 269 lb (122 kg)   SpO2 94%   BMI 37.52 kg/m       Assessment & Plan:  Tammie [name] comes in today with  chief complaint of Hospitalization Follow-up   Diagnosis and orders addressed:  1. Hospital discharge follow-up - CBC with Differential/Platelet - BMP8+EGFR  2. Atrial fibrillation, unspecified type ([place]) - CBC with Differential/Platelet - BMP8+EGFR - CoaguChek XS/INR Waived - Ambulatory referral to Cardiology  3. Acute on chronic diastolic CHF (congestive heart failure) (HCC) - CBC with Differential/Platelet - BMP8+EGFR - Ambulatory referral to Cardiology  4. Stage 3b chronic kidney disease (HCC) - CBC with Differential/Platelet - BMP8+EGFR  5. Need for hepatitis C screening test - CBC with Differential/Platelet - BMP8+EGFR - Hepatitis C antibody  6. Candidal skin infection -Do not scratch Keep clean and dry - Ketoconazole 2 % GEL; Apply 1 application topically in the morning and at bedtime.  Dispense: 45 g; Refill: 2 - fluconazole (DIFLUCAN) 150 MG  tablet; Take 1 tablet (150 mg total) by mouth every three (3) days as needed.  Dispense: 3 tablet; Refill: 0   Labs pending Health Maintenance reviewed Diet and exercise encouraged  Follow up plan: 3 months   [name], FNP

## 2020-09-29 NOTE — Addendum Note (Signed)
Addended by: [name] A on: 09/29/2020 12:05 PM   Modules accepted: Orders

## 2020-09-29 NOTE — Patient Instructions (Signed)
Heart Failure, Diagnosis  Heart failure is a condition in which the heart has trouble pumping blood because it has become weak or stiff. This means that the heart does not pump blood well enough for the body to stay healthy. For some people with heart failure, fluid may back up into the lungs. There may also be swelling (edema) in the lower legs. Heart failure is usually a long-term (chronic) condition. It is important for you to take good care of yourself and follow the treatment plan from your health care provider. What are the causes? This condition may be caused by:  High blood pressure (hypertension). Hypertension causes the heart muscle to work harder than normal. This makes the heart stiff or weak.  Coronary artery disease, or CAD. CAD is the buildup of cholesterol and fat (plaque) in the arteries of the heart.  Heart attack, also called myocardial infarction. This injures the heart muscle, making it hard for the heart to pump blood.  Abnormal heart valves. The valves do not open and close properly, forcing the heart to pump harder to keep the blood flowing.  Heart muscle disease (cardiomyopathy or myocarditis). This is damage to the heart muscle. It can increase the risk of heart failure.  Lung disease. The heart works harder when the lungs are not healthy.  Abnormal heart rhythms. These can lead to heart failure. What increases the risk? The risk of heart failure increases as a person ages. This condition is also more likely to develop in people who:  Are overweight.  Are female.  Smoke or chew tobacco.  Abuse alcohol or illegal drugs.  Have taken medicines that can damage the heart, such as chemotherapy drugs.  Have diabetes.  Have abnormal heart rhythms.  Have thyroid problems.  Have low blood counts (anemia). What are the signs or symptoms? Symptoms of this condition include:  Shortness of breath with activity, such as when climbing stairs.  A cough that does not  go away.  Swelling of the feet, ankles, legs, or abdomen.  Losing weight for no reason.  Trouble breathing when lying flat (orthopnea).  Waking from sleep because of the need to sit up and get more air.  Rapid heartbeat.  Tiredness (fatigue) and loss of energy.  Feeling light-headed, dizzy, or close to fainting.  Loss of appetite.  Nausea.  Waking up more often during the night to urinate (nocturia).  Confusion. How is this diagnosed? This condition is diagnosed based on:  Your medical history, symptoms, and a physical exam.  Diagnostic tests, which may include: ? Echocardiogram. ? Electrocardiogram (ECG). ? Chest X-ray. ? Blood tests. ? Exercise stress test. ? Radionuclide scans. ? Cardiac catheterization and angiogram. How is this treated? Treatment for this condition is aimed at managing the symptoms of heart failure. Medicines Treatment may include medicines that:  Help lower blood pressure by relaxing (dilating) the blood vessels. These medicines are called ACE inhibitors (angiotensin-converting enzyme) and ARBs (angiotensin receptor blockers).  Cause the kidneys to remove salt and water from the blood through urination (diuretics).  Improve heart muscle strength and prevent the heart from beating too fast (beta blockers).  Increase the force of the heartbeat (digoxin). Healthy behavior changes     Treatment may also include making healthy lifestyle changes, such as:  Reaching and staying at a healthy weight.  Quitting smoking or chewing tobacco.  Eating heart-healthy foods.  Limiting or avoiding alcohol.  Stopping the use of illegal drugs.  Being physically active.  Other treatments   Other treatments may include:  Procedures to open blocked arteries or repair damaged valves.  Placing a pacemaker to improve heart function (cardiac resynchronization therapy).  Placing a device to treat serious abnormal heart rhythms (implantable cardioverter  defibrillator, or ICD).  Placing a device to improve the pumping ability of the heart (left ventricular assist device, or LVAD).  Receiving a healthy heart from a donor (heart transplant). This is done when other treatments have not helped. Follow these instructions at home:  Manage other health conditions as told by your health care provider. These may include hypertension, diabetes, thyroid disease, or abnormal heart rhythms.  Get ongoing education and support as needed. Learn as much as you can about heart failure.  Keep all follow-up visits as told by your health care provider. This is important. Summary  Heart failure is a condition in which the heart has trouble pumping blood because it has become weak or stiff.  This condition is caused by high blood pressure and other diseases of the heart and lungs.  Symptoms of this condition include shortness of breath, tiredness (fatigue), nausea, and swelling of the feet, ankles, legs, or abdomen.  Treatments for this condition may include medicines, lifestyle changes, and surgery.  Manage other health conditions as told by your health care provider. This information is not intended to replace advice given to you by your health care provider. Make sure you discuss any questions you have with your health care provider. Document Revised: 03/02/2019 Document Reviewed: 03/02/2019 Elsevier Patient Education  2020 Elsevier Inc.  

## 2020-09-30 ENCOUNTER — Telehealth: Payer: Self-pay

## 2020-09-30 LAB — CBC WITH DIFFERENTIAL/PLATELET
Basophils Absolute: 0.1 10*3/uL (ref 0.0–0.2)
Basos: 1 %
EOS (ABSOLUTE): 0.2 10*3/uL (ref 0.0–0.4)
Eos: 3 %
Hematocrit: 46.8 % — ABNORMAL HIGH (ref 34.0–46.6)
Hemoglobin: 15.2 g/dL (ref 11.1–15.9)
Immature Grans (Abs): 0 10*3/uL (ref 0.0–0.1)
Immature Granulocytes: 0 %
Lymphocytes Absolute: 1.2 10*3/uL (ref 0.7–3.1)
Lymphs: 20 %
MCH: 27.9 pg (ref 26.6–33.0)
MCHC: 32.5 g/dL (ref 31.5–35.7)
MCV: 86 fL (ref 79–97)
Monocytes Absolute: 0.6 10*3/uL (ref 0.1–0.9)
Monocytes: 10 %
Neutrophils Absolute: 4.1 10*3/uL (ref 1.4–7.0)
Neutrophils: 66 %
Platelets: 154 10*3/uL (ref 150–450)
RBC: 5.44 x10E6/uL — ABNORMAL HIGH (ref 3.77–5.28)
RDW: 13.5 % (ref 11.7–15.4)
WBC: 6.2 10*3/uL (ref 3.4–10.8)

## 2020-09-30 LAB — BMP8+EGFR
BUN/Creatinine Ratio: 23 (ref 12–28)
BUN: 45 mg/dL — ABNORMAL HIGH (ref 8–27)
CO2: 23 mmol/L (ref 20–29)
Calcium: 9.2 mg/dL (ref 8.7–10.3)
Chloride: 103 mmol/L (ref 96–106)
Creatinine, Ser: 1.95 mg/dL — ABNORMAL HIGH (ref 0.57–1.00)
GFR calc Af Amer: 28 mL/min/{1.73_m2} — ABNORMAL LOW (ref >59)
GFR calc non Af Amer: 24 mL/min/{1.73_m2} — ABNORMAL LOW (ref >59)
Glucose: 82 mg/dL (ref 65–99)
Potassium: 5.5 mmol/L — ABNORMAL HIGH (ref 3.5–5.2)
Sodium: 143 mmol/L (ref 134–144)

## 2020-09-30 LAB — HEPATITIS C ANTIBODY: Hep C Virus Ab: <0.1 s/co ratio (ref 0.0–0.9)

## 2020-09-30 NOTE — Telephone Encounter (Signed)
Wants someone to call her to go over her lab results.

## 2020-09-30 NOTE — Telephone Encounter (Signed)
Please review labs. 

## 2020-09-30 NOTE — Telephone Encounter (Signed)
See result note.  

## 2020-10-01 ENCOUNTER — Other Ambulatory Visit: Payer: Self-pay | Admitting: Family

## 2020-10-01 DIAGNOSIS — I10 Essential (primary) hypertension: Secondary | ICD-10-CM

## 2020-10-03 ENCOUNTER — Ambulatory Visit: Payer: PPO | Admitting: Family

## 2020-10-06 ENCOUNTER — Encounter: Payer: Self-pay | Admitting: Family

## 2020-10-06 ENCOUNTER — Other Ambulatory Visit: Payer: Self-pay

## 2020-10-06 ENCOUNTER — Ambulatory Visit (INDEPENDENT_AMBULATORY_CARE_PROVIDER_SITE_OTHER): Payer: PPO | Admitting: Family

## 2020-10-06 VITALS — BP 115/66 | HR 51 | Temp 97.0°F | Ht 71.0 in | Wt 269.4 lb

## 2020-10-06 DIAGNOSIS — I4891 Unspecified atrial fibrillation: Secondary | ICD-10-CM | POA: Diagnosis not present

## 2020-10-06 DIAGNOSIS — Z7901 Long term (current) use of anticoagulants: Secondary | ICD-10-CM | POA: Diagnosis not present

## 2020-10-06 LAB — COAGUCHEK XS/INR WAIVED
INR: 3.1 — ABNORMAL HIGH (ref 0.9–1.1)
Prothrombin Time: 37.4 s

## 2020-10-06 NOTE — Patient Instructions (Signed)
Description   INR today was 3.1 (goal is 2-3). Too thin.  Hold todays dose (10/06/20), then take  2.5mg  ( 1 tablet)  Everyday, except Friday 1.25 mg (1/2 tablet).

## 2020-10-06 NOTE — Progress Notes (Signed)
   Subjective:    Patient ID: Tammie [name], female    DOB: 1941-08-24, 79 y.o.   MRN: [id number]  Chief Complaint  Patient presents with  . Coagulation Disorder    HPI Pt presents to the office today for INR check. PT currently taking warfarin for A Fib. Pt currently taking 2.5 mg every day.   She was seen last week and her INR was 3.3. She was told to hold that day's dose and then resume 2.5 mg daily. She was recently hospitalized.   Denies any bleeding, bruising, medication changes, or diet changes.   Denies any chest pain, or palpitations. No new SOB, but has this chronically .    Review of Systems  All other systems reviewed and are negative.      Objective:   Physical Exam Vitals reviewed.  Constitutional:      General: She is not in acute distress.    Appearance: She is well-developed. She is obese.  HENT:     Head: Normocephalic and atraumatic.  Eyes:     Pupils: Pupils are equal, round, and reactive to light.  Neck:     Thyroid: No thyromegaly.  Cardiovascular:     Rate and Rhythm: Normal rate and regular rhythm.     Heart sounds: Normal heart sounds. No murmur heard.   Pulmonary:     Effort: Pulmonary effort is normal. No respiratory distress.     Breath sounds: Normal breath sounds. No wheezing.  Abdominal:     General: Bowel sounds are normal. There is no distension.     Palpations: Abdomen is soft.     Tenderness: There is no abdominal tenderness.  Musculoskeletal:        General: No tenderness. Normal range of motion.     Cervical back: Normal range of motion and neck supple.     Right lower leg: Edema (2+) present.     Left lower leg: Edema (2+) present.  Skin:    General: Skin is warm and dry.  Neurological:     Mental Status: She is alert and oriented to person, place, and time.     Cranial Nerves: No cranial nerve deficit.     Deep Tendon Reflexes: Reflexes are normal and symmetric.  Psychiatric:        Behavior: Behavior normal.         Thought Content: Thought content normal.        Judgment: Judgment normal.       BP 115/66   Pulse (!) 51   Temp (!) 97 F (36.1 C) (Temporal)   Ht 5\' 11"  (1.803 m)   Wt 269 lb 6.4 oz (122.2 kg)   SpO2 93%   BMI 37.57 kg/m      Assessment & Plan:  Tammie [name] comes in today with chief complaint of Coagulation Disorder   Diagnosis and orders addressed:  1. Atrial fibrillation, unspecified type ([place]) - CoaguChek XS/INR Waived  2. Warfarin anticoagulation  Description   INR today was 3.1 (goal is 2-3). Too thin.  Hold todays dose (10/06/20), then take  2.5mg  ( 1 tablet)  Everyday, except Friday 1.25 mg (1/2 tablet).        RTO in 2 weeks to recheck   Tammie Dun, FNP

## 2020-10-07 IMAGING — CT CT CHEST WITHOUT CONTRAST
2 of 4 series · 13 of 36 positions shown, 16 images · non-contrast
Comparison: 12/19/2018

CLINICAL DATA: Stage IV colon cancer.  Restaging.

EXAM:
CT CHEST, ABDOMEN AND PELVIS WITHOUT CONTRAST
TECHNIQUE: Multidetector CT imaging of the chest, abdomen and pelvis was
performed following the standard protocol without IV contrast.

[Series 2: cap w/o · axial · non-contrast · 0.82mm/px · z∈[-540,+6]mm · 10 of 131 slices shown, 13 images]
[im 11/131  mediastinal]
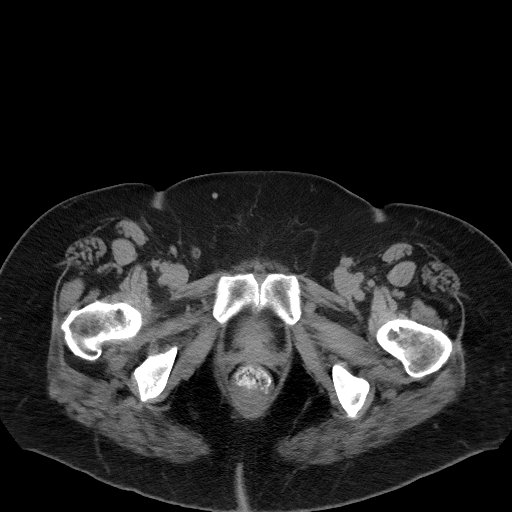
[im 11/131  lung]
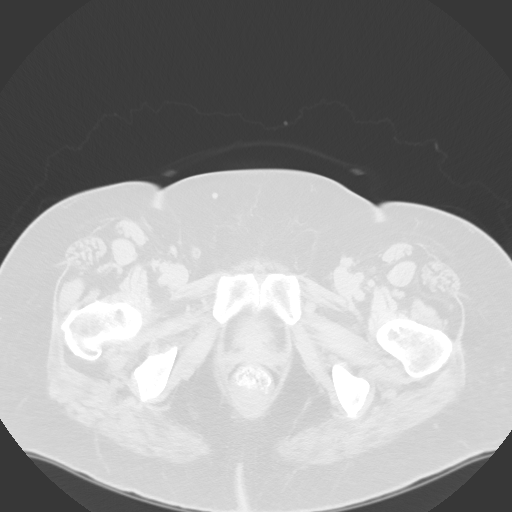
[im 22/131  lung]
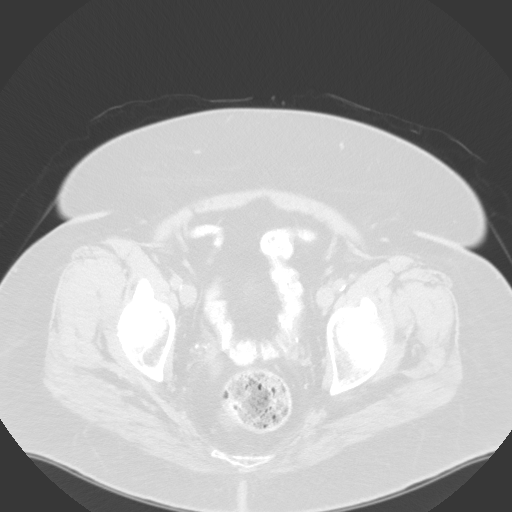
[im 33/131  lung]
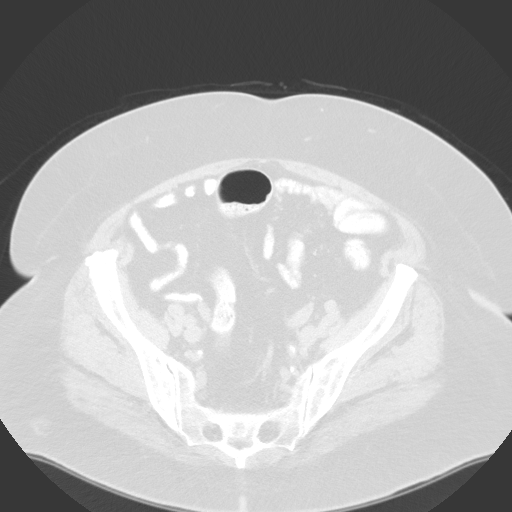
[im 44/131  lung]
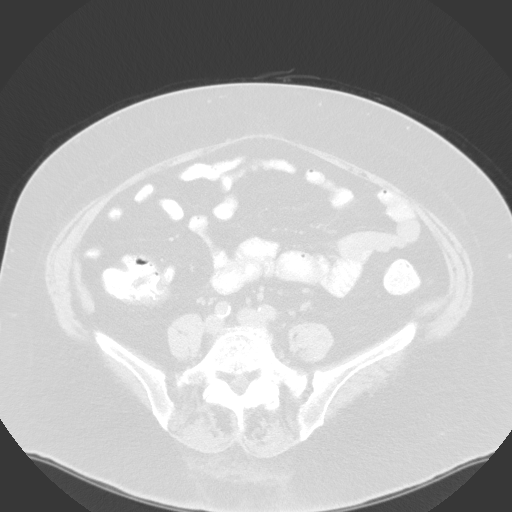
[im 55/131  mediastinal]
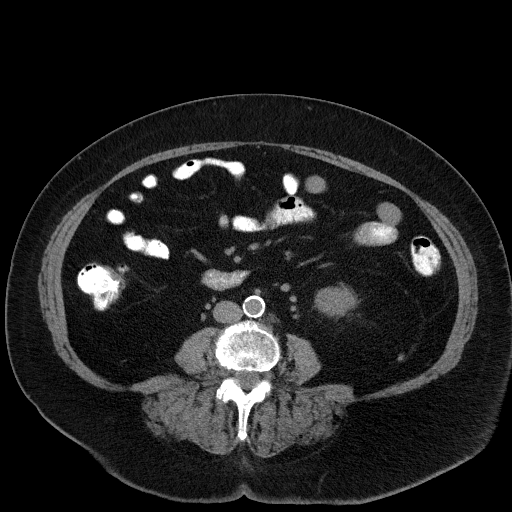
[im 55/131  lung]
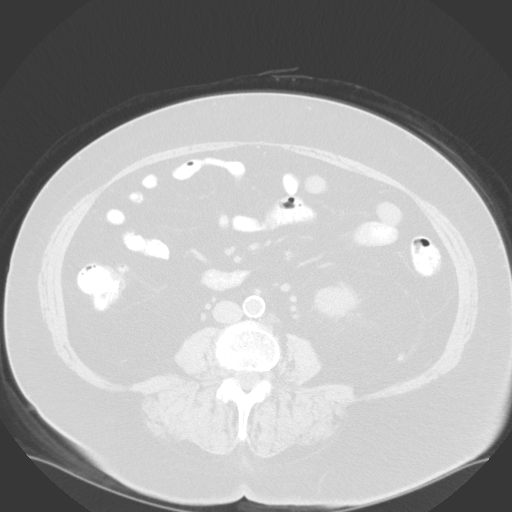
[im 76/131  lung]
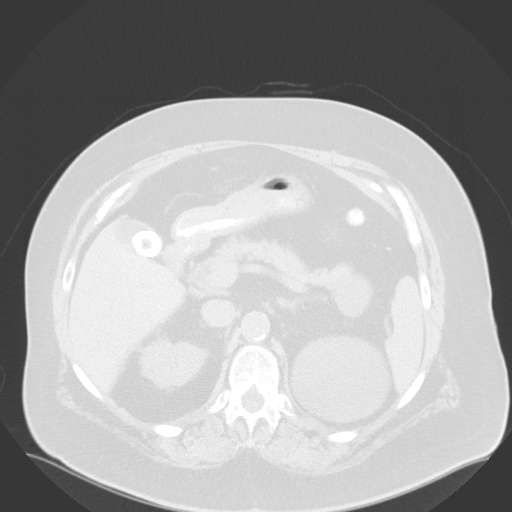
[im 87/131  lung]
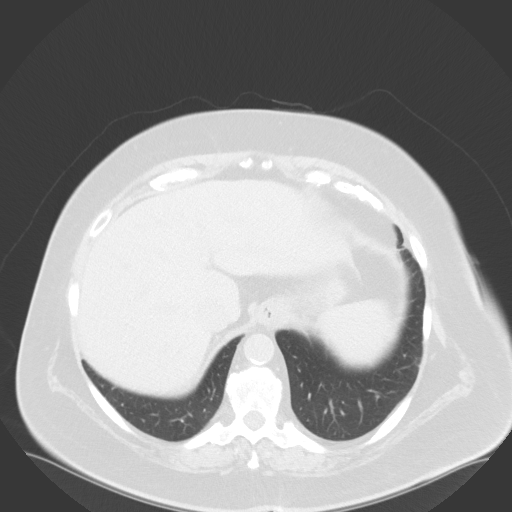
[im 98/131  lung]
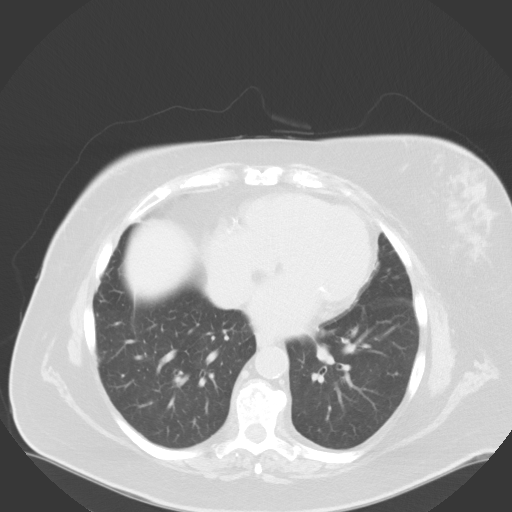
[im 109/131  mediastinal]
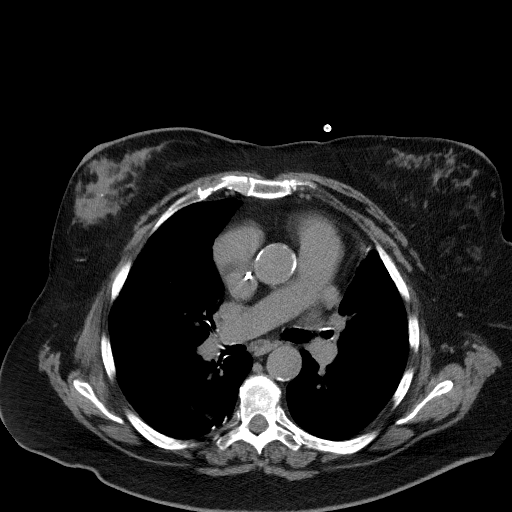
[im 109/131  lung]
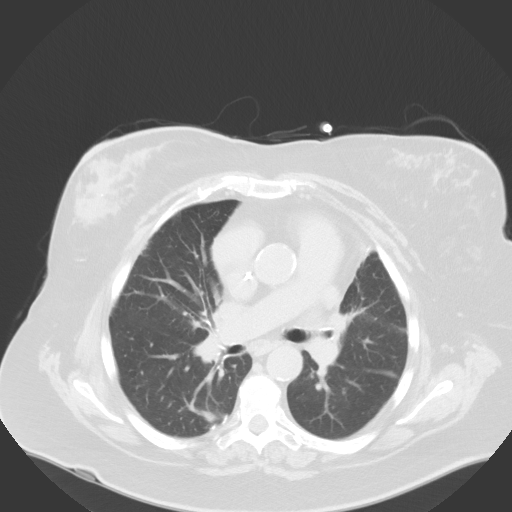
[im 120/131  lung]
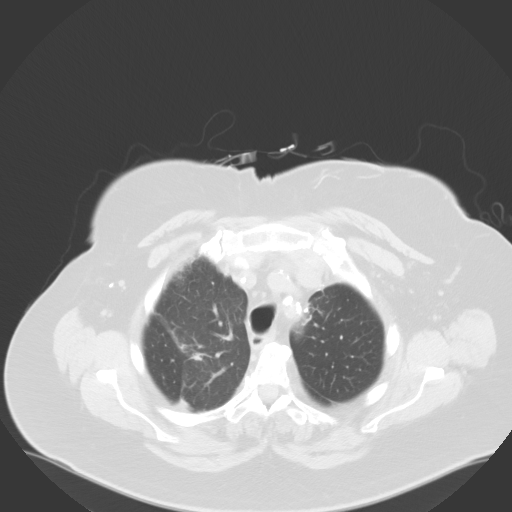

[Series 5: coronals · coronal · 0.85mm/px · 3 of 170 slices shown]
[im 34/170  lung]
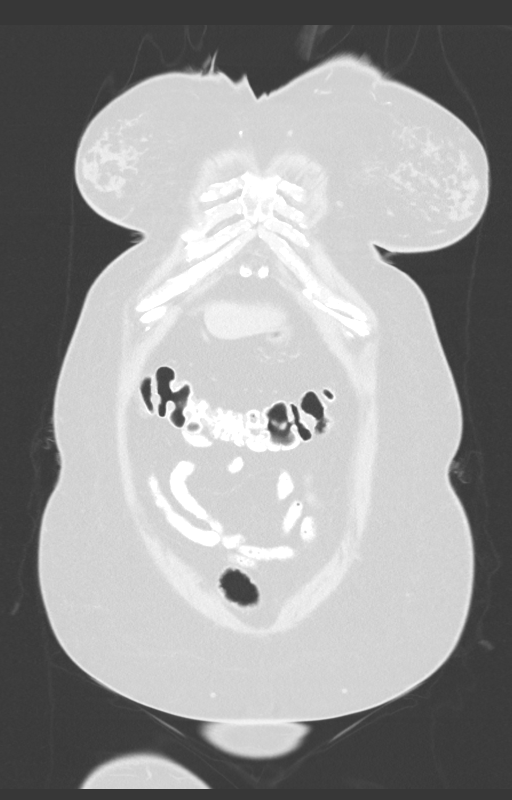
[im 68/170  lung]
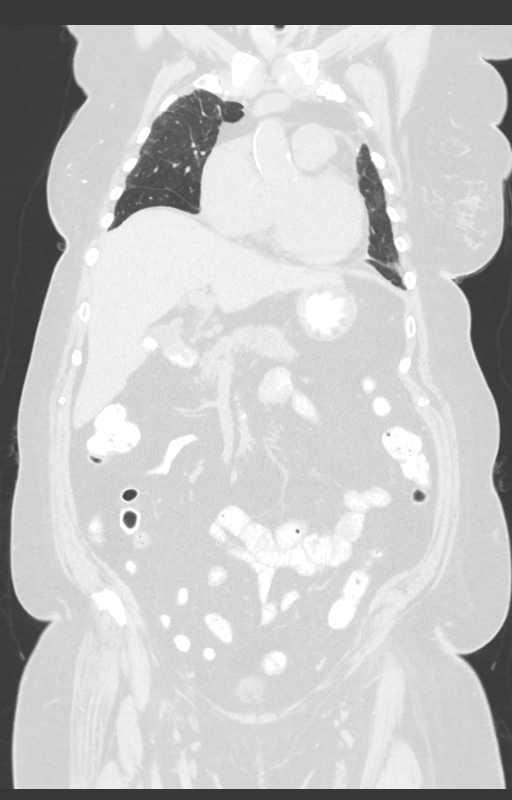
[im 102/170  lung]
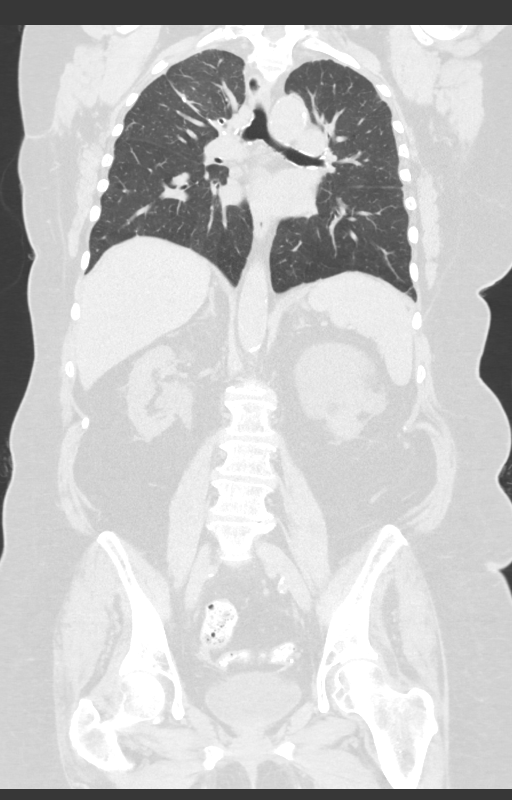

[13 of 36 positions shown; findings below may reference images not displayed]

FINDINGS: CT CHEST FINDINGS

Cardiovascular: Heart size appears normal. Aortic atherosclerosis.
Three vessel coronary artery atherosclerotic calcifications.

Mediastinum/Nodes: No supraclavicular or axillary adenopathy. No
mediastinal or hilar adenopathy. Normal appearance of the thyroid
gland. The trachea appears patent and is midline. Normal appearance
of the esophagus.

Lungs/Pleura: No pleural effusion identified. Postoperative changes
identified within both lungs. Part solid nodule in the posterior
right lower lobe measures 1.4 cm within 8 mm central solid
component, image 89. Previously this measured 1.1 cm with a 8 mm
central solid component. Anterior right apical lung nodule measures
6 x 6 mm, image [DATE]. Previously this measured the same. Small
perifissural ground-glass nodule identified within the superior
segment of left lower lobe measures 4 mm, image 38/4. Unchanged.

Musculoskeletal: Spondylosis identified within the thoracic spine.

CT ABDOMEN PELVIS FINDINGS

Hepatobiliary: The contour the liver is slightly irregular. This is
similar to the previous exam. No focal liver abnormality. Large
calcified gallstone. No wall thickening or pericholecystic
inflammation.

Pancreas: Unremarkable. No pancreatic ductal dilatation or
surrounding inflammatory changes.

Spleen: Normal in size without focal abnormality.

Adrenals/Urinary Tract: The adrenal glands appear normal. Large left
kidney cysts are identified scratch set multiple bilateral kidney
cysts are identified. The largest arises from the posterior cortex
of the left kidney measuring 8.9 cm, image 61/2. Right lower pole
renal calculus is unchanged measuring 6 mm, image 68/2. Dilated
right extrarenal pelvis contains the 8 mm stone, image 66/2. There
is surrounding urothelial thickening and fat stranding, unchanged
from previous exam. No ureteral calculi. Urinary bladder normal.

Stomach/Bowel: Stomach normal. Small bowel loops have a normal
course and caliber without obstruction. The appendix is visualized
and appears normal. Normal appearance of the colon

Vascular/Lymphatic: Aortic atherosclerosis. No aneurysm. No
abdominopelvic adenopathy.

Reproductive: Status post hysterectomy. No adnexal masses.

Other: No free fluid or fluid collections identified.

Musculoskeletal: No aggressive lytic or sclerotic bone lesions.
IMPRESSION: 1. There is a part solid nodule within the subpleural aspect of the
posterior right lower lobe which demonstrates mild increase in size
compared with previous exam. Currently 1.4 cm with a central solid
component measuring 8 mm. Previously 1.1 cm with a central solid
component of 8 mm. Findings are suspicious for a small pulmonary
adenocarcinoma.
2. Within the anterior right lung apex there is a stable 6 mm lung
nodule.
3. Right renal calculi. Stone within dilated right extrarenal pelvis
with surrounding inflammation is again noted.
4. Gallstone.
5.  Aortic Atherosclerosis ([id number].S).

## 2020-10-13 ENCOUNTER — Other Ambulatory Visit: Payer: Self-pay | Admitting: Family

## 2020-10-13 DIAGNOSIS — I10 Essential (primary) hypertension: Secondary | ICD-10-CM

## 2020-10-14 NOTE — Telephone Encounter (Signed)
[facility] did received the 10/01/20 Rx and mailed it to the patient on 10/07/20

## 2020-10-20 ENCOUNTER — Ambulatory Visit (INDEPENDENT_AMBULATORY_CARE_PROVIDER_SITE_OTHER): Payer: PPO | Admitting: Family

## 2020-10-20 ENCOUNTER — Other Ambulatory Visit: Payer: Self-pay

## 2020-10-20 ENCOUNTER — Encounter: Payer: Self-pay | Admitting: Family

## 2020-10-20 VITALS — BP 123/64 | HR 63 | Temp 96.7°F | Ht 71.0 in | Wt 270.0 lb

## 2020-10-20 DIAGNOSIS — R0602 Shortness of breath: Secondary | ICD-10-CM

## 2020-10-20 DIAGNOSIS — J449 Chronic obstructive pulmonary disease, unspecified: Secondary | ICD-10-CM | POA: Diagnosis not present

## 2020-10-20 DIAGNOSIS — I4891 Unspecified atrial fibrillation: Secondary | ICD-10-CM | POA: Diagnosis not present

## 2020-10-20 DIAGNOSIS — C3431 Malignant neoplasm of lower lobe, right bronchus or lung: Secondary | ICD-10-CM

## 2020-10-20 LAB — COAGUCHEK XS/INR WAIVED
INR: 2.6 — ABNORMAL HIGH (ref 0.9–1.1)
Prothrombin Time: 31.3 s

## 2020-10-20 NOTE — Patient Instructions (Signed)
Description   INR today was 2.6 (goal is 2-3). Perfect!  Continue 2.5mg  ( 1 tablet)  Everyday, except Friday 1.25 mg (1/2 tablet).

## 2020-10-20 NOTE — Addendum Note (Signed)
Addended by: [name] A on: 10/20/2020 12:09 PM   Modules accepted: Orders

## 2020-10-20 NOTE — Progress Notes (Addendum)
Subjective:    Patient ID: Tammie [name], female    DOB: [birth year], 79 y.o.   MRN: [id number]  Chief Complaint  Patient presents with  . Coagulation Disorder    HPI Pt presents to the office today for INR check. PT currently taking warfarin for A Fib. Pt currently taking 2.5 mg every day except on Friday taking 1.25 mg.   Her INR today is 2.6 perfect today!  Denies any bleeding, bruising, medication changes, or diet changes.   Denies any chest pain, or palpitations.   She has chronic SOB and her O2 saturation was 87% sitting, after resting it bounced to 91%. She has hx of lung cancer and COPD.   Resting RA sat= 87%   Review of Systems  Respiratory: Positive for shortness of breath.   Cardiovascular: Positive for leg swelling.  All other systems reviewed and are negative.      Objective:   Physical Exam Vitals reviewed.  Constitutional:      General: She is not in acute distress.    Appearance: She is well-developed. She is obese.  HENT:     Head: Normocephalic and atraumatic.  Eyes:     Pupils: Pupils are equal, round, and reactive to light.  Neck:     Thyroid: No thyromegaly.  Cardiovascular:     Rate and Rhythm: Normal rate and regular rhythm.     Heart sounds: Normal heart sounds. No murmur heard.   Pulmonary:     Effort: Pulmonary effort is normal. No respiratory distress.     Breath sounds: Decreased breath sounds present. No wheezing.  Abdominal:     General: Bowel sounds are normal. There is no distension.     Palpations: Abdomen is soft.     Tenderness: There is no abdominal tenderness.  Musculoskeletal:        General: No tenderness. Normal range of motion.     Cervical back: Normal range of motion and neck supple.     Right lower leg: Edema (3+) present.     Left lower leg: Edema (3+) present.  Skin:    General: Skin is warm and dry.  Neurological:     Mental Status: She is alert and oriented to person, place, and time.     Cranial Nerves:  No cranial nerve deficit.     Deep Tendon Reflexes: Reflexes are normal and symmetric.  Psychiatric:        Behavior: Behavior normal.        Thought Content: Thought content normal.        Judgment: Judgment normal.          BP 123/64   Pulse 63   Temp (!) 96.7 F (35.9 C) (Temporal)   Ht 5\' 11"  (1.803 m)   Wt 270 lb (122.5 kg)   SpO2 91% Comment: 87-91 while sitting  BMI 37.66 kg/m   Assessment & Plan:  Tammie [name] comes in today with chief complaint of Coagulation Disorder   Diagnosis and orders addressed  1. Atrial fibrillation, unspecified type [name]) Description   INR today was 2.6 (goal is 2-3). Perfect!  Continue 2.5mg  ( 1 tablet)  Everyday, except Friday 1.25 mg (1/2 tablet).         - CoaguChek XS/INR Waived  2. SOB (shortness of breath) - For home use only DME oxygen  3. COPD with chronic bronchitis ([place]) - For home use only DME oxygen  4. 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 ([place]) -  For home use only DME oxygen  Continue current medications   Tammie Dun, FNP

## 2020-10-21 DIAGNOSIS — J449 Chronic obstructive pulmonary disease, unspecified: Secondary | ICD-10-CM | POA: Diagnosis not present

## 2020-10-22 ENCOUNTER — Telehealth: Payer: Self-pay

## 2020-10-22 MED ORDER — POTASSIUM CHLORIDE ER 10 MEQ PO TBCR: 10.0000 meq | EXTENDED_RELEASE_TABLET | Freq: Every day | ORAL | 1 refills | Status: DC

## 2020-10-22 NOTE — Telephone Encounter (Signed)
  Prescription Request  10/22/2020  What is the name of the medication or equipment? potassium chloride (KLOR-CON) 10 MEQ tablet   Have you contacted your pharmacy to request a refill? (if applicable) Yes  Which pharmacy would you like this sent to? [facility], pt states it will be mail order through [place]?    Patient notified that their request is being sent to the clinical staff for review and that they should receive a response within 2 business days.

## 2020-10-22 NOTE — Telephone Encounter (Signed)
Refill sent.

## 2020-10-30 ENCOUNTER — Ambulatory Visit: Payer: PPO | Admitting: Cardiology

## 2020-11-17 ENCOUNTER — Encounter: Payer: Self-pay | Admitting: Family

## 2020-11-17 ENCOUNTER — Ambulatory Visit (INDEPENDENT_AMBULATORY_CARE_PROVIDER_SITE_OTHER): Payer: PPO | Admitting: Family

## 2020-11-17 ENCOUNTER — Other Ambulatory Visit: Payer: Self-pay

## 2020-11-17 ENCOUNTER — Telehealth: Payer: Self-pay

## 2020-11-17 VITALS — BP 128/72 | HR 65 | Temp 98.0°F | Ht 71.0 in | Wt 275.0 lb

## 2020-11-17 DIAGNOSIS — B372 Candidiasis of skin and nail: Secondary | ICD-10-CM

## 2020-11-17 DIAGNOSIS — I4891 Unspecified atrial fibrillation: Secondary | ICD-10-CM | POA: Diagnosis not present

## 2020-11-17 DIAGNOSIS — Z7901 Long term (current) use of anticoagulants: Secondary | ICD-10-CM

## 2020-11-17 LAB — COAGUCHEK XS/INR WAIVED
INR: 1.6 — ABNORMAL HIGH (ref 0.9–1.1)
Prothrombin Time: 18.6 s

## 2020-11-17 MED ORDER — NYSTATIN 100000 UNIT/GM EX POWD: 1.0000 "application " | Freq: Three times a day (TID) | CUTANEOUS | 2 refills | Status: DC

## 2020-11-17 MED ORDER — KETOCONAZOLE 2 % EX CREA: 1.0000 "application " | TOPICAL_CREAM | Freq: Every day | CUTANEOUS | 2 refills | Status: DC

## 2020-11-17 MED ORDER — KETOCONAZOLE 2 % EX GEL: 1.0000 "application " | Freq: Two times a day (BID) | CUTANEOUS | 2 refills | Status: DC

## 2020-11-17 NOTE — Patient Instructions (Signed)
Description   INR today was 1.6 (goal is 2-3). Too thick.   2.5mg  ( 1 tablet)  everyday.          Skin Yeast Infection  A skin yeast infection is a condition in which there is an overgrowth of yeast (candida) that normally lives on the skin. This condition usually occurs in areas of the skin that are constantly warm and moist, such as the armpits or the groin. What are the causes? This condition is caused by a change in the normal balance of the yeast and bacteria that live on the skin. What increases the risk? You are more likely to develop this condition if you:  Are obese.  Are pregnant.  Take birth control pills.  Have diabetes.  Take antibiotic medicines.  Take steroid medicines.  Are malnourished.  Have a weak body defense system (immune system).  Are 51 years of age or older.  Wear tight clothing. What are the signs or symptoms? The most common symptom of this condition is itchiness in the affected area. Other symptoms include:  Red, swollen area of the skin.  Bumps on the skin. How is this diagnosed?  This condition is diagnosed with a medical history and physical exam.  Your health care provider may check for yeast by taking light scrapings of the skin to be viewed under a microscope. How is this treated? This condition is treated with medicine. Medicines may be prescribed or be available over the counter. The medicines may be:  Taken by mouth (orally).  Applied as a cream or powder to your skin. Follow these instructions at home:   Take or apply over-the-counter and prescription medicines only as told by your health care provider.  Maintain a healthy weight. If you need help losing weight, talk with your health care provider.  Keep your skin clean and dry.  If you have diabetes, keep your blood sugar under control.  Keep all follow-up visits as told by your health care provider. This is important. Contact a health care provider if:  Your  symptoms go away and then return.  Your symptoms do not get better with treatment.  Your symptoms get worse.  Your rash spreads.  You have a fever or chills.  You have new symptoms.  You have new warmth or redness of your skin. Summary  A skin yeast infection is a condition in which there is an overgrowth of yeast (candida) that normally lives on the skin. This condition is caused by a change in the normal balance of the yeast and bacteria that live on the skin.  Take or apply over-the-counter and prescription medicines only as told by your health care provider.  Keep your skin clean and dry.  Contact a health care provider if your symptoms do not get better with treatment. This information is not intended to replace advice given to you by your health care provider. Make sure you discuss any questions you have with your health care provider. Document Revised: 05/02/2018 Document Reviewed: 05/02/2018 Elsevier Patient Education  [place].

## 2020-11-17 NOTE — Telephone Encounter (Signed)
Prescription sent to pharmacy.

## 2020-11-17 NOTE — Telephone Encounter (Signed)
Pharmacy states that the ketoconazole does not come in a gel- Requesting the rx be re sent in cream form.

## 2020-11-17 NOTE — Progress Notes (Signed)
Subjective:    Patient ID: Tammie [name], female    DOB: Jun 06, 1941, 79 y.o.   MRN: [id number]  Chief Complaint  Patient presents with  . Anticoagulation  . Recurrent Skin Infections   Pt presents to the office today for INR check. PT currently taking warfarin for A Fib. Pt currently taking 2.5 mg every day except on Friday taking 1.25 mg.   Her INR today is 1.6. See anticoagulation flow sheet.   Denies any bleeding, bruising, medication changes, or diet changes.   Denies any chest pain, or palpitations.  Rash This is a recurrent problem. The current episode started more than 1 month ago. The affected locations include the left axilla and right axilla (under breast ). The rash is characterized by redness. Associated with: keoconazole.       Review of Systems  Skin: Positive for rash.  All other systems reviewed and are negative.      Objective:   Physical Exam Vitals reviewed.  Constitutional:      General: She is not in acute distress.    Appearance: She is well-developed.  HENT:     Head: Normocephalic and atraumatic.  Eyes:     Pupils: Pupils are equal, round, and reactive to light.  Neck:     Thyroid: No thyromegaly.  Cardiovascular:     Rate and Rhythm: Normal rate and regular rhythm.     Heart sounds: Normal heart sounds. No murmur heard.   Pulmonary:     Effort: Pulmonary effort is normal. No respiratory distress.     Breath sounds: Normal breath sounds. No wheezing.  Abdominal:     General: Bowel sounds are normal. There is no distension.     Palpations: Abdomen is soft.     Tenderness: There is no abdominal tenderness.  Musculoskeletal:        General: No tenderness. Normal range of motion.     Cervical back: Normal range of motion and neck supple.  Skin:    General: Skin is warm and dry.     Findings: Erythema and rash present.     Comments: Erythemas yeast rash under right breast, left axilla, and lower abdomen.   Neurological:     Mental  Status: She is alert and oriented to person, place, and time.     Cranial Nerves: No cranial nerve deficit.     Deep Tendon Reflexes: Reflexes are normal and symmetric.  Psychiatric:        Behavior: Behavior normal.        Thought Content: Thought content normal.        Judgment: Judgment normal.       BP 128/72   Pulse 65   Temp 98 F (36.7 C)   Ht 5\' 11"  (1.803 m)   Wt 275 lb (124.7 kg)   SpO2 95%   BMI 38.35 kg/m      Assessment & Plan:  Tammie [name] comes in today with chief complaint of Anticoagulation and Recurrent Skin Infections   Diagnosis and orders addressed:  1. Atrial fibrillation, unspecified type [facility]) Description   INR today was 1.6 (goal is 2-3). Too thick.   2.5mg  ( 1 tablet)  everyday.        - We will set patient up with home INR - CoaguChek XS/INR Waived  2. Candidal skin infection Keep clean and dry - nystatin (MYCOSTATIN/NYSTOP) powder; Apply 1 application topically 3 (three) times daily.  Dispense: 90 g; Refill: 2 - Ketoconazole 2 %  GEL; Apply 1 application topically in the morning and at bedtime.  Dispense: 60 g; Refill: 2  3. Warfarin anticoagulation   Follow up plan: 2 weeks    [name], FNP

## 2020-11-17 NOTE — Addendum Note (Signed)
Addended by: [name] A on: 11/17/2020 01:08 PM   Modules accepted: Orders

## 2020-11-21 DIAGNOSIS — J449 Chronic obstructive pulmonary disease, unspecified: Secondary | ICD-10-CM | POA: Diagnosis not present

## 2020-11-26 IMAGING — CT CT RENAL STONE PROTOCOL
2 of 4 series · 16 of 46 positions shown, 18 images · non-contrast
Comparison: CT chest, abdomen and pelvis 03/22/2019.

CLINICAL DATA: Right flank pain and vomiting beginning this
morning.

EXAM:
CT ABDOMEN AND PELVIS WITHOUT CONTRAST
TECHNIQUE: Multidetector CT imaging of the abdomen and pelvis was performed
following the standard protocol without IV contrast.

[Series 2: axial st · axial · 0.81mm/px · z∈[+940,+1365]mm · 13 of 97 slices shown, 15 images]
[im 6/97  soft-tissue]
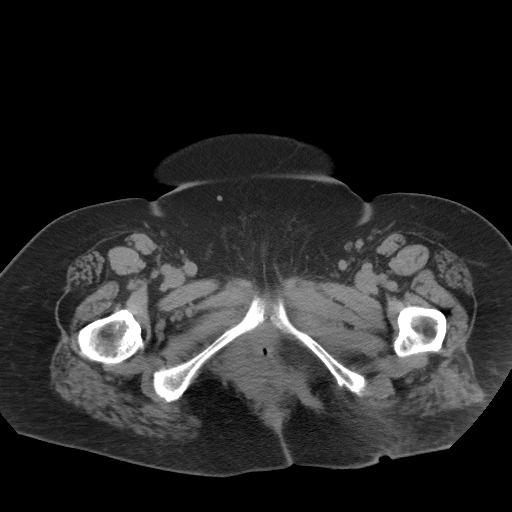
[im 6/97  bone]
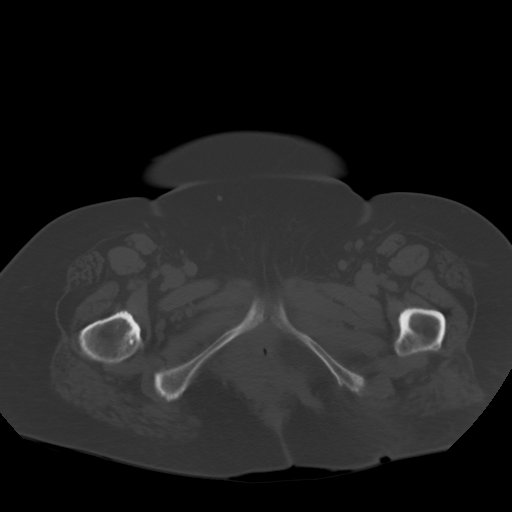
[im 11/97  soft-tissue]
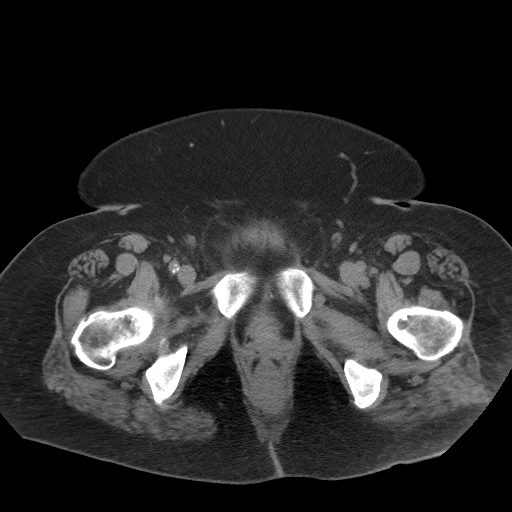
[im 22/97  soft-tissue]
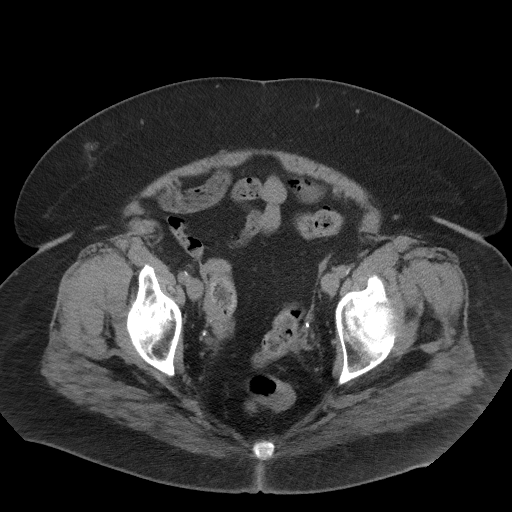
[im 27/97  soft-tissue]
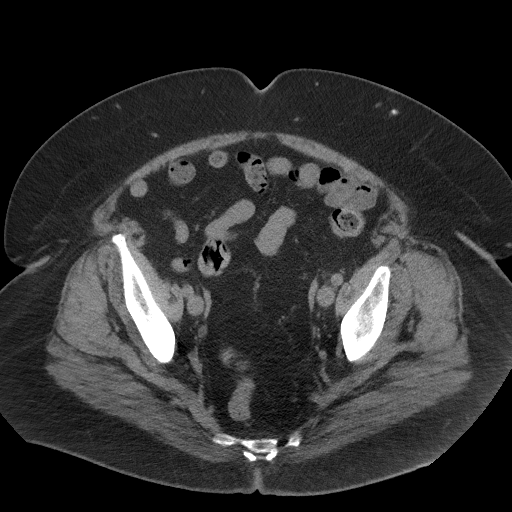
[im 33/97  soft-tissue]
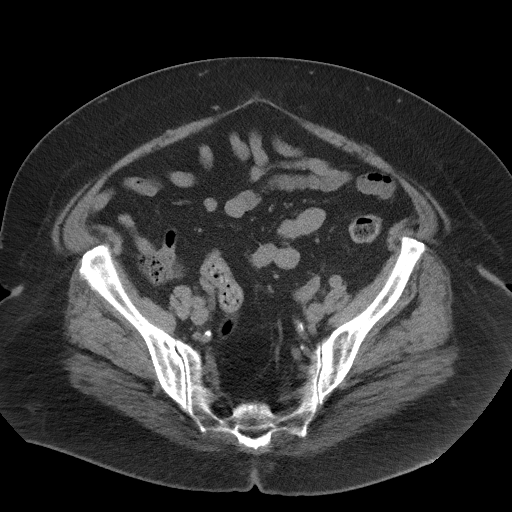
[im 43/97  soft-tissue]
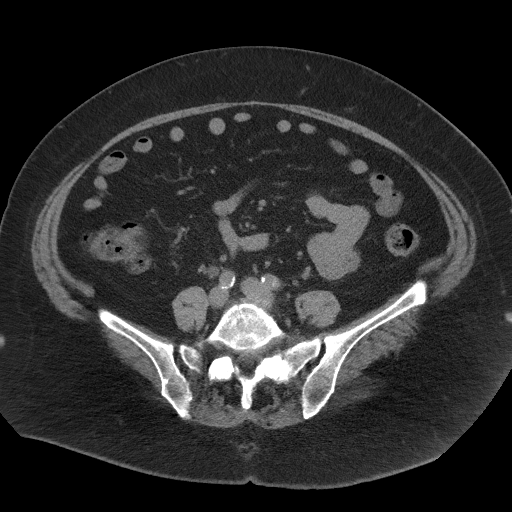
[im 49/97  soft-tissue]
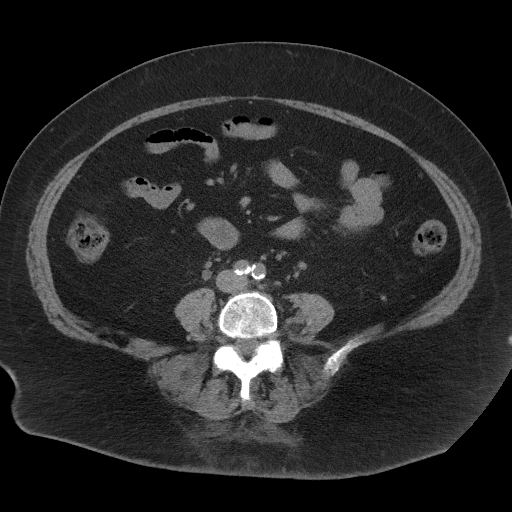
[im 54/97  soft-tissue]
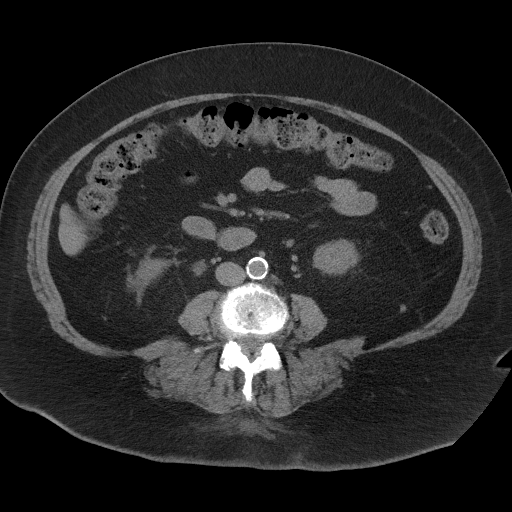
[im 65/97  soft-tissue]
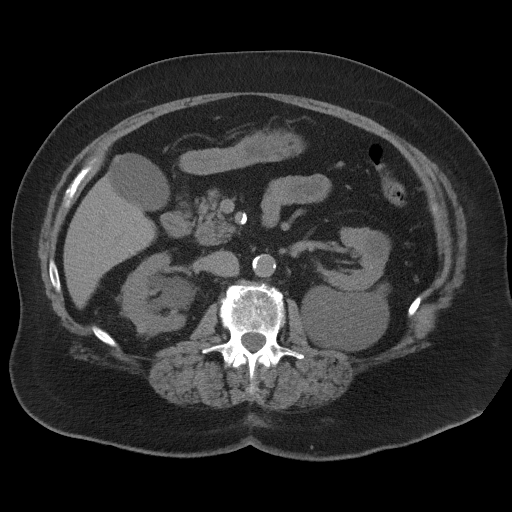
[im 65/97  bone]
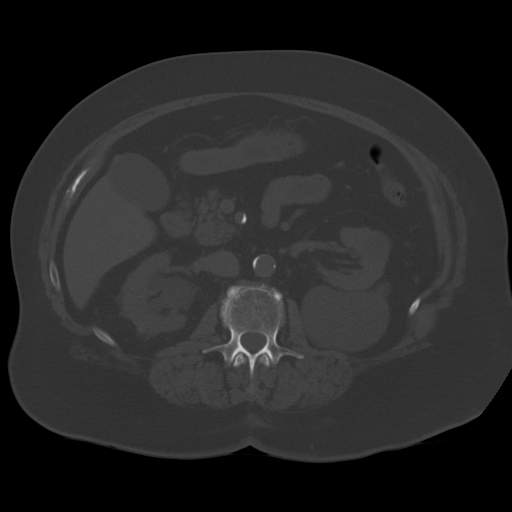
[im 70/97  soft-tissue]
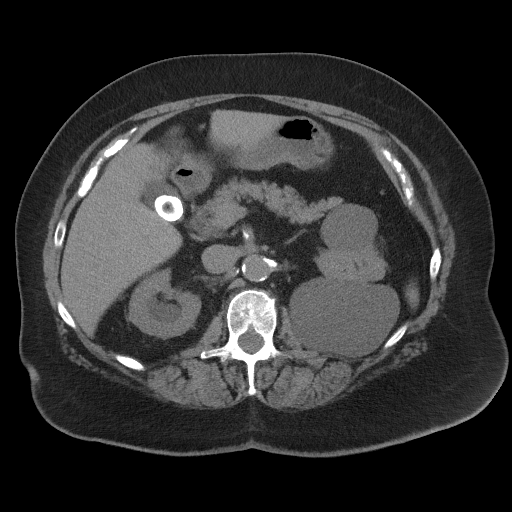
[im 75/97  soft-tissue]
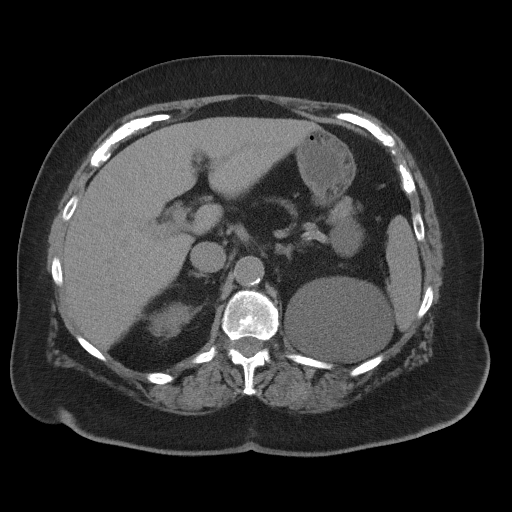
[im 86/97  soft-tissue]
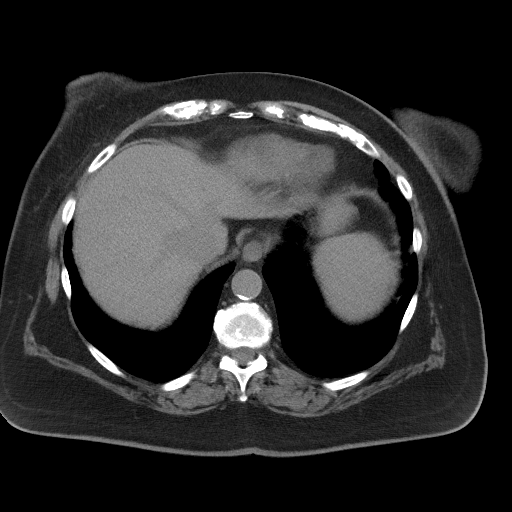
[im 91/97  soft-tissue]
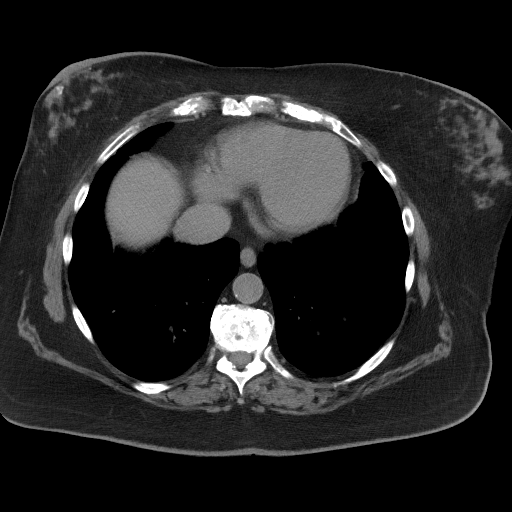

[Series 5: coronal st · coronal · 0.86mm/px · 3 of 117 slices shown]
[im 39/117  soft-tissue]
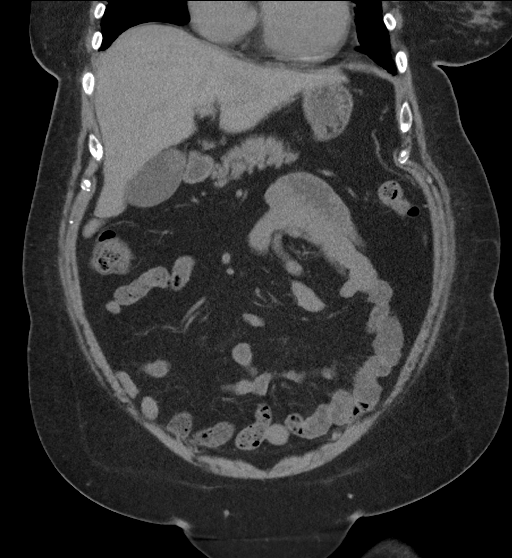
[im 52/117  soft-tissue]
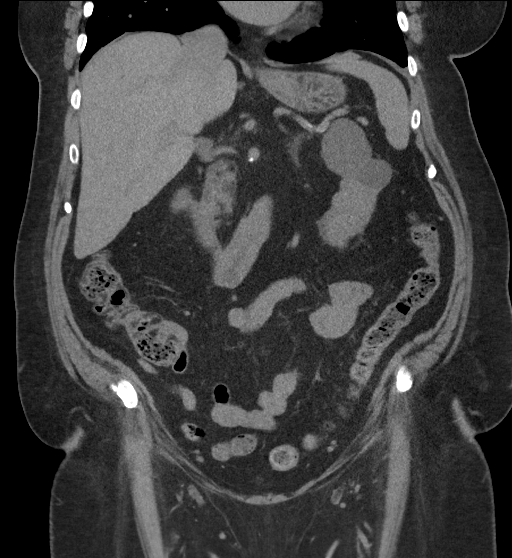
[im 65/117  soft-tissue]
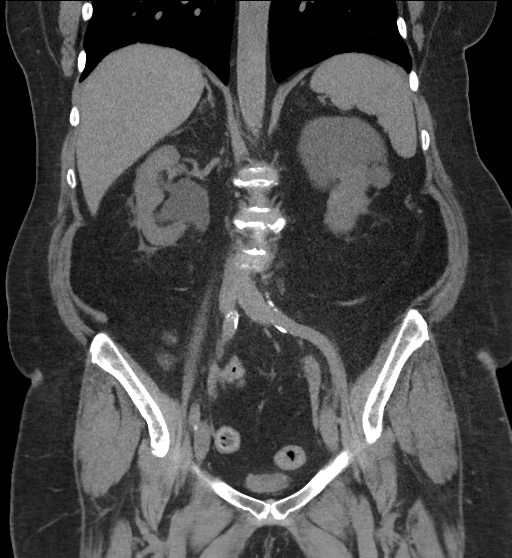

[16 of 46 positions shown; findings below may reference images not displayed]

FINDINGS: Lower chest: Part solid right lower lobe nodule measuring 1.4 cm is
unchanged on image 7. Lung bases otherwise clear. Heart size normal.
No pleural or pericardial effusion.

Hepatobiliary: Single 2.5 cm gallstone without evidence of
cholecystitis is unchanged. No focal liver lesion. There is minimal
irregularity of the liver border, unchanged. Biliary tree is
unremarkable.

Pancreas: Unremarkable. No pancreatic ductal dilatation or
surrounding inflammatory changes.

Spleen: Normal in size without focal abnormality.

Adrenals/Urinary Tract: The adrenal glands appear normal. Bilateral
renal cysts are again seen. There is new mild to moderate right
hydronephrosis due to a 0.4 cm stone at the right UVJ. Two
nonobstructing stones in the right kidney are unchanged. The larger
is in the lower pole and measures 0.8 cm. Two nonobstructing left
renal stones are also seen. The larger is in the lower pole
measuring 0.5 cm. Urinary bladder is decompressed but otherwise
unremarkable.

Stomach/Bowel: Stomach is within normal limits. Appendix appears
normal. No evidence of bowel wall thickening, distention, or
inflammatory changes.

Vascular/Lymphatic: Aortic atherosclerosis. No enlarged abdominal or
pelvic lymph nodes.

Reproductive: Status post hysterectomy. No adnexal masses.

Other: None.

Musculoskeletal: No lytic or sclerotic lesion.  No fracture.
IMPRESSION: Mild to moderate right hydronephrosis due to a 0.4 cm stone at the
right UVJ. The patient has 2 additional nonobstructing stones in
each kidney.

No change in a part solid nodule in the posterior right lower lobe
worrisome for adenocarcinoma as described on report of the
comparison exam.

Atherosclerosis.

Gallstone without evidence of cholecystitis.

## 2020-11-28 ENCOUNTER — Telehealth: Payer: Self-pay

## 2020-12-01 ENCOUNTER — Ambulatory Visit: Payer: PPO | Admitting: Family

## 2020-12-01 NOTE — Telephone Encounter (Signed)
Message from [place] said that patient's insurance pays 80% and she is responsible for 20% Gave pt [employer]'s number to call to see if they have anymore information that maybe different than the EOB she received from her insurance company
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
## 2020-12-02 ENCOUNTER — Encounter: Payer: Self-pay | Admitting: Cardiology

## 2020-12-02 ENCOUNTER — Ambulatory Visit (INDEPENDENT_AMBULATORY_CARE_PROVIDER_SITE_OTHER): Payer: PPO | Admitting: Pharmacist Clinician (PhC)/ Clinical Pharmacy Specialist

## 2020-12-02 ENCOUNTER — Other Ambulatory Visit: Payer: Self-pay

## 2020-12-02 ENCOUNTER — Ambulatory Visit: Payer: PPO | Admitting: Cardiology

## 2020-12-02 VITALS — BP 112/72 | HR 70 | Ht 71.0 in | Wt 274.8 lb

## 2020-12-02 DIAGNOSIS — R0602 Shortness of breath: Secondary | ICD-10-CM

## 2020-12-02 DIAGNOSIS — I482 Chronic atrial fibrillation, unspecified: Secondary | ICD-10-CM | POA: Diagnosis not present

## 2020-12-02 DIAGNOSIS — N1832 Chronic kidney disease, stage 3b: Secondary | ICD-10-CM

## 2020-12-02 DIAGNOSIS — I4891 Unspecified atrial fibrillation: Secondary | ICD-10-CM

## 2020-12-02 LAB — POCT INR: INR: 1.9 — AB (ref 2.0–3.0)

## 2020-12-02 NOTE — Patient Instructions (Addendum)
Medication Instructions:  No changes *If you need a refill on your cardiac medications before your next appointment, please call your pharmacy*  Lab Work: None ordered this visit  Testing/Procedures: None ordered this visit  Follow-Up: At [facility], you and your health needs are our priority.  As part of our continuing mission to provide you with exceptional heart care, we have created designated Provider Care Teams.  These Care Teams include your primary Cardiologist (physician) and Advanced Practice Providers (APPs -  Physician Assistants and Nurse Practitioners) who all work together to provide you with the care you need, when you need it.  Your next appointment:   6 month(s) in [facility] will receive a reminder letter in the mail two months in advance. If you don't receive a letter, please call our office to schedule the follow-up appointment.  The format for your next appointment:   In Person  Provider:   Minus Breeding, MD

## 2020-12-03 ENCOUNTER — Other Ambulatory Visit: Payer: Self-pay | Admitting: Family

## 2020-12-03 DIAGNOSIS — E785 Hyperlipidemia, unspecified: Secondary | ICD-10-CM

## 2020-12-12 ENCOUNTER — Ambulatory Visit: Payer: PPO | Admitting: Family

## 2020-12-12 ENCOUNTER — Telehealth: Payer: Self-pay

## 2020-12-12 NOTE — Telephone Encounter (Signed)
Yes, please add today.

## 2020-12-12 NOTE — Telephone Encounter (Signed)
Did her Cardiologists change her warfarin? They had sent me a message to follow up with patient. Ok to be telephone visit.

## 2020-12-12 NOTE — Telephone Encounter (Signed)
Called patient - states that her cardiologist checked her INR last and she does not want to come in today because she does not feel well. Advised patient that Alyse Low wanted her to come in today and she states she does not want to be seen today.

## 2020-12-12 NOTE — Telephone Encounter (Signed)
Looks like patient was not contacted yesterday.  Do you want her added on to your schedule today?

## 2020-12-12 NOTE — Telephone Encounter (Signed)
Attempted to contact patient - NA °

## 2020-12-12 NOTE — Progress Notes (Unsigned)
   Virtual Visit via telephone Note Due to COVID-19 pandemic this visit was conducted virtually. This visit type was conducted due to national recommendations for restrictions regarding the COVID-19 Pandemic (e.g. social distancing, sheltering in place) in an effort to limit this patient's exposure and mitigate transmission in our community. All issues noted in this document were discussed and addressed.  A physical exam was not performed with this format.  I connected with Tammie [name] on 12/12/20 at 3:14 pm  by telephone and verified that I am speaking with the correct person using two identifiers. Tammie [name] is currently located at *** and *** is currently with *** during visit. The provider, [name], FNP is located in their office at time of visit.  I discussed the limitations, risks, security and privacy concerns of performing an evaluation and management service by telephone and the availability of in person appointments. I also discussed with the patient that there may be a patient responsible charge related to this service. The patient expressed understanding and agreed to proceed.   History and Present Illness:  HPI    ROS   Observations/Objective: ***  Assessment and Plan: ***  Follow Up Instructions: ***    I discussed the assessment and treatment plan with the patient. The patient was provided an opportunity to ask questions and all were answered. The patient agreed with the plan and demonstrated an understanding of the instructions.   The patient was advised to call back or seek an in-person evaluation if the symptoms worsen or if the condition fails to improve as anticipated.  The above assessment and management plan was discussed with the patient. The patient verbalized understanding of and has agreed to the management plan. Patient is aware to call the clinic if symptoms persist or worsen. Patient is aware when to return to the clinic for a follow-up visit.  Patient educated on when it is appropriate to go to the emergency department.   Time call ended:    I provided *** minutes of non-face-to-face time during this encounter.    [name], FNP

## 2020-12-12 NOTE — Telephone Encounter (Signed)
-----   Message from [name], [place] sent at 12/11/2020  2:56 PM EST ----- Please schedule patient a telephone visit with me today.   [name], FNP  ----- Message ----- From: Rockne Menghini, RPH-CPP Sent: 12/02/2020   4:17 PM EST To: [name], FNP  Alyse Low We checked INR on this patient while she was in the office seeing Dr. Percival Spanish today.  Since you are more familiar with her history, I am just forwarding the INR report to you for dosing and setting up a return visit.    Have a good day. Tommy Medal PharmD CPP [facility]

## 2020-12-16 ENCOUNTER — Telehealth: Payer: Self-pay | Admitting: *Deleted

## 2020-12-16 DIAGNOSIS — I4811 Longstanding persistent atrial fibrillation: Secondary | ICD-10-CM

## 2020-12-16 NOTE — Telephone Encounter (Signed)
Description   INR 1.9 (too thick) Goal-2-3  Take 5 mg today, then resume 2.5 mg daily Recheck in 1 week

## 2020-12-16 NOTE — Telephone Encounter (Signed)
Pt informed and understood

## 2020-12-16 NOTE — Telephone Encounter (Signed)
Fax received mdINR PT/INR self testing service Test date/time 12/16/20 219 pm INR 1.9

## 2020-12-18 NOTE — Telephone Encounter (Signed)
Spoke with pt. Her INR today is 1.9. Our office has already contacted her about this. Pt states that her cardiologist has not changed her Warfarin.

## 2020-12-21 DIAGNOSIS — J449 Chronic obstructive pulmonary disease, unspecified: Secondary | ICD-10-CM | POA: Diagnosis not present

## 2020-12-29 ENCOUNTER — Encounter: Payer: Self-pay | Admitting: Family

## 2020-12-29 ENCOUNTER — Ambulatory Visit (INDEPENDENT_AMBULATORY_CARE_PROVIDER_SITE_OTHER): Payer: HMO | Admitting: Family

## 2020-12-29 ENCOUNTER — Other Ambulatory Visit: Payer: Self-pay

## 2020-12-29 VITALS — BP 126/74 | Temp 97.3°F | Ht 71.0 in | Wt 266.4 lb

## 2020-12-29 DIAGNOSIS — C229 Malignant neoplasm of liver, not specified as primary or secondary: Secondary | ICD-10-CM

## 2020-12-29 DIAGNOSIS — J449 Chronic obstructive pulmonary disease, unspecified: Secondary | ICD-10-CM

## 2020-12-29 DIAGNOSIS — E785 Hyperlipidemia, unspecified: Secondary | ICD-10-CM

## 2020-12-29 DIAGNOSIS — I10 Essential (primary) hypertension: Secondary | ICD-10-CM

## 2020-12-29 DIAGNOSIS — I5033 Acute on chronic diastolic (congestive) heart failure: Secondary | ICD-10-CM

## 2020-12-29 DIAGNOSIS — C3431 Malignant neoplasm of lower lobe, right bronchus or lung: Secondary | ICD-10-CM

## 2020-12-29 DIAGNOSIS — Z7901 Long term (current) use of anticoagulants: Secondary | ICD-10-CM

## 2020-12-29 DIAGNOSIS — N1832 Chronic kidney disease, stage 3b: Secondary | ICD-10-CM

## 2020-12-29 DIAGNOSIS — J45909 Unspecified asthma, uncomplicated: Secondary | ICD-10-CM | POA: Diagnosis not present

## 2020-12-29 DIAGNOSIS — I4891 Unspecified atrial fibrillation: Secondary | ICD-10-CM

## 2020-12-29 DIAGNOSIS — C50411 Malignant neoplasm of upper-outer quadrant of right female breast: Secondary | ICD-10-CM

## 2020-12-29 LAB — COAGUCHEK XS/INR WAIVED
INR: 2.6 — ABNORMAL HIGH (ref 0.9–1.1)
Prothrombin Time: 31.5 s

## 2020-12-29 NOTE — Patient Instructions (Signed)

## 2020-12-29 NOTE — Progress Notes (Signed)
Subjective:    Patient ID: Tammie [name], female    DOB: 1941/09/07, 80 y.o.   MRN: [id number] Pt presents to the office today for chronic follow up.She is followed by Oncologists every 3 months for hx of right breast cancer, right lung cancer, and Colon cancer with mets to the liver.  Hypertension This is a chronic problem. The current episode started more than 1 year ago. The problem has been resolved since onset. The problem is controlled. Associated symptoms include malaise/fatigue and shortness of breath. Pertinent negatives include no peripheral edema. Risk factors for coronary artery disease include dyslipidemia, obesity and sedentary lifestyle. The current treatment provides moderate improvement. Hypertensive end-organ damage includes kidney disease. There is no history of CVA or heart failure.  Congestive Heart Failure Presents for follow-up visit. Associated symptoms include edema and shortness of breath. Pertinent negatives include no chest pressure. The symptoms have been stable.  Hyperlipidemia This is a chronic problem. The current episode started more than 1 year ago. Exacerbating diseases include obesity. Associated symptoms include shortness of breath. Current antihyperlipidemic treatment includes statins.  COPD States she has SOB and using abluterol as needed which is not very often. She is not using the QVAR at all.    Review of Systems  Constitutional: Positive for malaise/fatigue.  Respiratory: Positive for shortness of breath.   All other systems reviewed and are negative.      Objective:   Physical Exam Vitals reviewed.  Constitutional:      General: She is not in acute distress.    Appearance: She is well-developed and well-nourished. She is obese.  HENT:     Head: Normocephalic and atraumatic.     Right Ear: Tympanic membrane normal.     Left Ear: Tympanic membrane normal.     Mouth/Throat:     Mouth: Oropharynx is clear and moist.  Eyes:     Pupils:  Pupils are equal, round, and reactive to light.  Neck:     Thyroid: No thyromegaly.  Cardiovascular:     Rate and Rhythm: Normal rate and regular rhythm.     Pulses: Intact distal pulses.     Heart sounds: Normal heart sounds. No murmur heard.   Pulmonary:     Effort: Pulmonary effort is normal. No respiratory distress.     Breath sounds: Decreased breath sounds and wheezing present.  Abdominal:     General: Bowel sounds are normal. There is no distension.     Palpations: Abdomen is soft.     Tenderness: There is no abdominal tenderness.  Musculoskeletal:        General: No tenderness. Normal range of motion.     Cervical back: Normal range of motion and neck supple.     Right lower leg: Edema (2+) present.     Left lower leg: Edema (2+) present.  Skin:    General: Skin is warm and dry.  Neurological:     Mental Status: She is alert and oriented to person, place, and time.     Cranial Nerves: No cranial nerve deficit.     Deep Tendon Reflexes: Reflexes are normal and symmetric.  Psychiatric:        Mood and Affect: Mood and affect normal.        Behavior: Behavior normal.        Thought Content: Thought content normal.        Judgment: Judgment normal.       BP 126/74   Temp (!) 97.3  F (36.3 C) (Temporal)   Ht '5\' 11"'  (1.803 m)   Wt 266 lb 6.4 oz (120.8 kg)   BMI 37.16 kg/m      Assessment & Plan:  Tammie [name] comes in today with chief complaint of Medical Management of Chronic Issues   Diagnosis and orders addressed:  1. Acute on chronic diastolic CHF (congestive heart failure) (HCC) - CMP14+EGFR - CBC with Differential/Platelet  2. Primary hypertension - CMP14+EGFR - CBC with Differential/Platelet  3. Uncomplicated asthma, unspecified asthma severity, unspecified whether persistent - CMP14+EGFR - CBC with Differential/Platelet  4. 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 (HCC) - CMP14+EGFR - CBC with Differential/Platelet  5. COPD with  chronic bronchitis (HCC) - CMP14+EGFR - CBC with Differential/Platelet  6. 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 Stage 3b chronic kidney disease ([place]) - CMP14+EGFR - CBC with Differential/Platelet  8. Hyperlipidemia with target LDL less than 100 - CMP14+EGFR - CBC with Differential/Platelet  9. Malignant neoplasm of upper-outer quadrant of right female breast, unspecified estrogen receptor status ([place]) - CMP14+EGFR - CBC with Differential/Platelet   10. Atrial fibrillation, unspecified type ([place]) - CoaguChek XS/INR Waived  11. Warfarin anticoagulation - CoaguChek XS/INR Waived  Labs pending Health Maintenance reviewed Diet and exercise encouraged  Follow up plan: 6 months    [name], FNP

## 2020-12-30 LAB — CBC WITH DIFFERENTIAL/PLATELET
Basophils Absolute: 0.1 10*3/uL (ref 0.0–0.2)
Basos: 1 %
EOS (ABSOLUTE): 0.2 10*3/uL (ref 0.0–0.4)
Eos: 3 %
Hematocrit: 47.9 % — ABNORMAL HIGH (ref 34.0–46.6)
Hemoglobin: 15.5 g/dL (ref 11.1–15.9)
Immature Grans (Abs): 0 10*3/uL (ref 0.0–0.1)
Immature Granulocytes: 0 %
Lymphocytes Absolute: 1.2 10*3/uL (ref 0.7–3.1)
Lymphs: 17 %
MCH: 28 pg (ref 26.6–33.0)
MCHC: 32.4 g/dL (ref 31.5–35.7)
MCV: 87 fL (ref 79–97)
Monocytes Absolute: 0.5 10*3/uL (ref 0.1–0.9)
Monocytes: 8 %
Neutrophils Absolute: 4.7 10*3/uL (ref 1.4–7.0)
Neutrophils: 71 %
Platelets: 165 10*3/uL (ref 150–450)
RBC: 5.54 x10E6/uL — ABNORMAL HIGH (ref 3.77–5.28)
RDW: 13.1 % (ref 11.7–15.4)
WBC: 6.7 10*3/uL (ref 3.4–10.8)

## 2020-12-30 LAB — CMP14+EGFR
ALT: 12 IU/L (ref 0–32)
AST: 18 IU/L (ref 0–40)
Albumin/Globulin Ratio: 1.7 (ref 1.2–2.2)
Albumin: 4.5 g/dL (ref 3.7–4.7)
Alkaline Phosphatase: 48 IU/L (ref 44–121)
BUN/Creatinine Ratio: 15 (ref 12–28)
BUN: 26 mg/dL (ref 8–27)
Bilirubin Total: 0.4 mg/dL (ref 0.0–1.2)
CO2: 24 mmol/L (ref 20–29)
Calcium: 9.7 mg/dL (ref 8.7–10.3)
Chloride: 105 mmol/L (ref 96–106)
Creatinine, Ser: 1.7 mg/dL — ABNORMAL HIGH (ref 0.57–1.00)
GFR calc Af Amer: 33 mL/min/{1.73_m2} — ABNORMAL LOW (ref >59)
GFR calc non Af Amer: 28 mL/min/{1.73_m2} — ABNORMAL LOW (ref >59)
Globulin, Total: 2.6 g/dL (ref 1.5–4.5)
Glucose: 96 mg/dL (ref 65–99)
Potassium: 5.2 mmol/L (ref 3.5–5.2)
Sodium: 143 mmol/L (ref 134–144)
Total Protein: 7.1 g/dL (ref 6.0–8.5)

## 2021-01-06 ENCOUNTER — Other Ambulatory Visit: Payer: Self-pay | Admitting: Family

## 2021-01-06 DIAGNOSIS — I10 Essential (primary) hypertension: Secondary | ICD-10-CM

## 2021-01-06 DIAGNOSIS — I4891 Unspecified atrial fibrillation: Secondary | ICD-10-CM

## 2021-01-07 ENCOUNTER — Telehealth: Payer: Self-pay | Admitting: *Deleted

## 2021-01-07 DIAGNOSIS — I4811 Longstanding persistent atrial fibrillation: Secondary | ICD-10-CM

## 2021-01-07 NOTE — Telephone Encounter (Signed)
Fax received mdINR PT/INR self testing service Test date/time 01/06/21 254 pm INR 3.4

## 2021-01-07 NOTE — Telephone Encounter (Signed)
Patient aware and verbalizes understanding. 

## 2021-01-07 NOTE — Telephone Encounter (Signed)
Description   INR 3.4 (too thin) Goal-2-3  Hold today's dose (01/07/21),  then resume 2.5 mg daily Recheck in 1 week

## 2021-01-13 IMAGING — CT CT CHEST WITHOUT CONTRAST
2 of 4 series · 13 of 36 positions shown, 16 images · non-contrast
Comparison: CT chest 03/22/2019. CT renal stone protocol
03/11/2019.

CLINICAL DATA: Non-small-cell lung cancer. Stage IIIA
adenocarcinoma of the right upper and lower lobes diagnosed June 2014. Metastatic colon cancer of the ascending colon with liver
metastases diagnosed 8242. More remote history of breast cancer.

EXAM:
CT CHEST, ABDOMEN AND PELVIS WITHOUT CONTRAST
TECHNIQUE: Multidetector CT imaging of the chest, abdomen and pelvis was
performed following the standard protocol without IV contrast.

[Series 2: cap w/o · axial · non-contrast · 0.83mm/px · z∈[+1102,+1652]mm · 10 of 134 slices shown, 13 images]
[im 12/134  mediastinal]
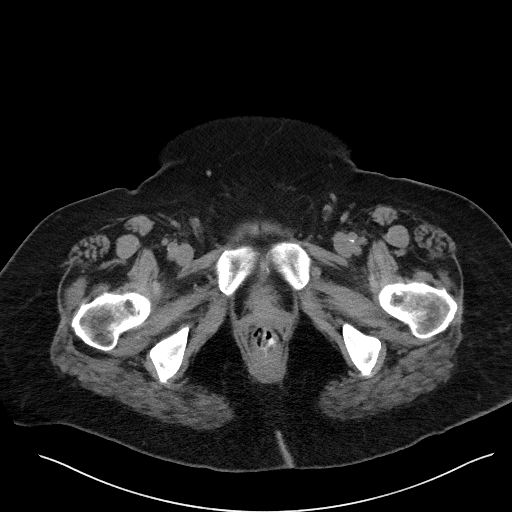
[im 12/134  lung]
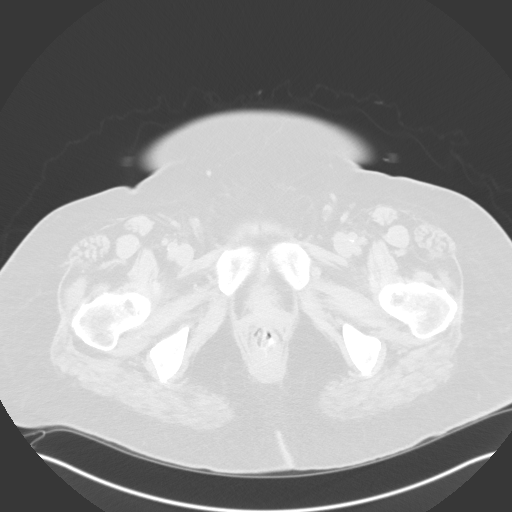
[im 23/134  lung]
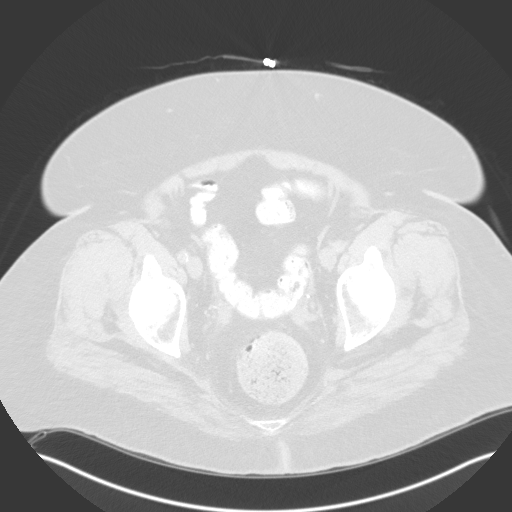
[im 34/134  lung]
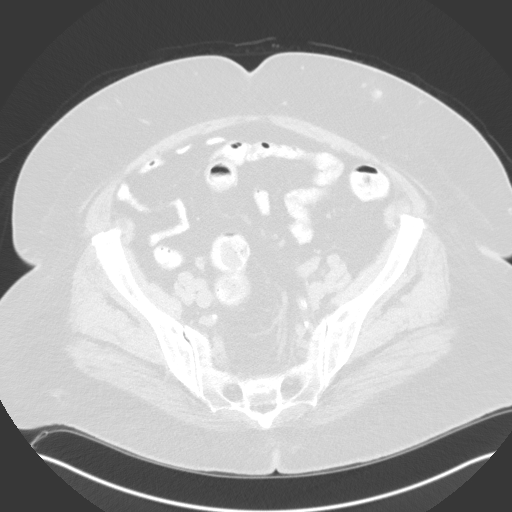
[im 45/134  lung]
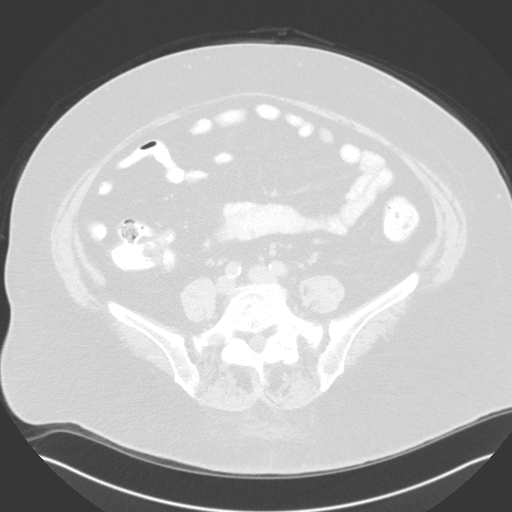
[im 56/134  mediastinal]
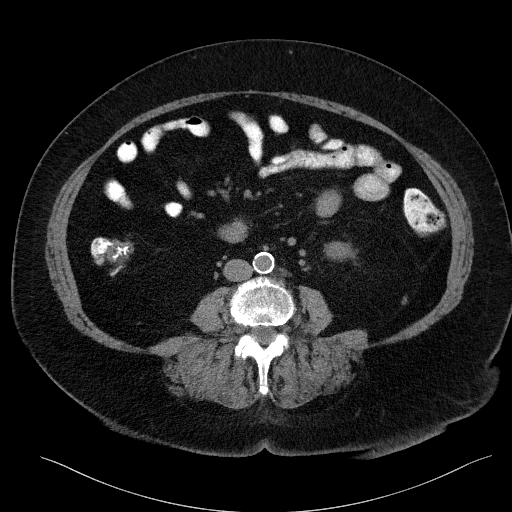
[im 56/134  lung]
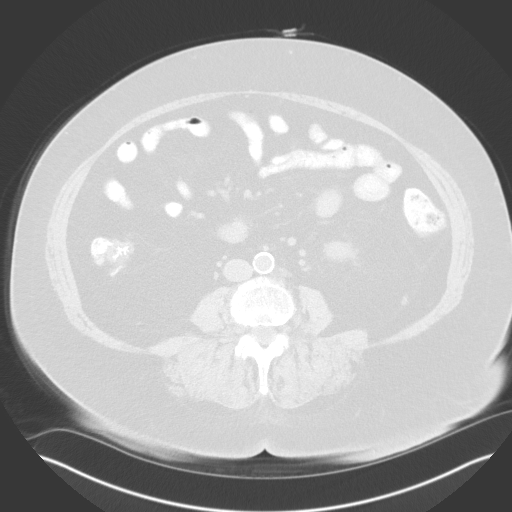
[im 78/134  lung]
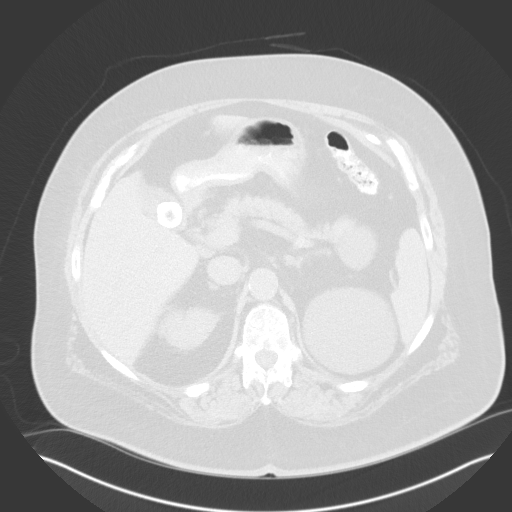
[im 89/134  lung]
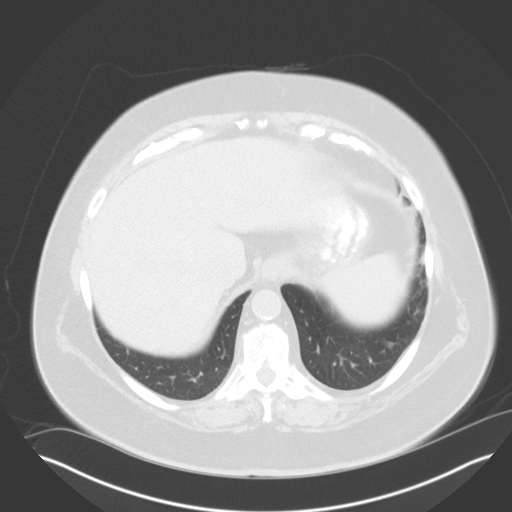
[im 100/134  lung]
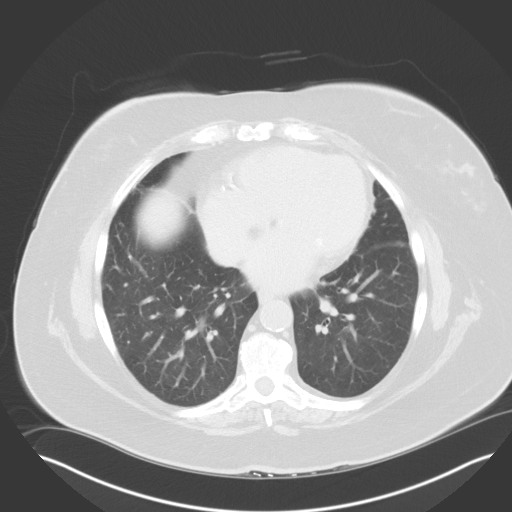
[im 111/134  mediastinal]
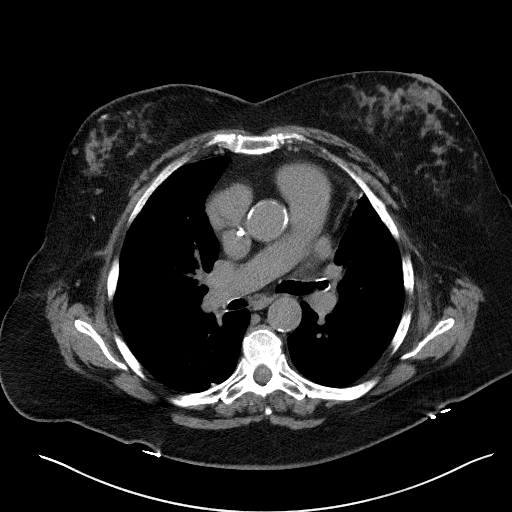
[im 111/134  lung]
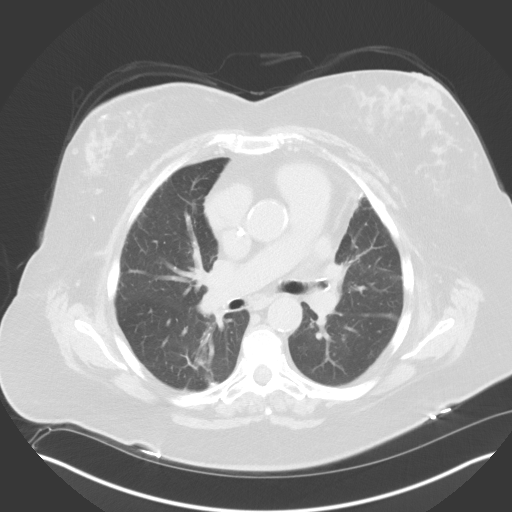
[im 122/134  lung]
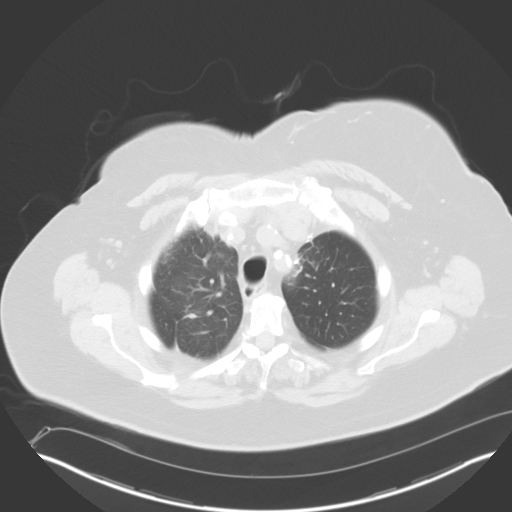

[Series 5: coronals · coronal · 0.90mm/px · 3 of 167 slices shown]
[im 34/167  lung]
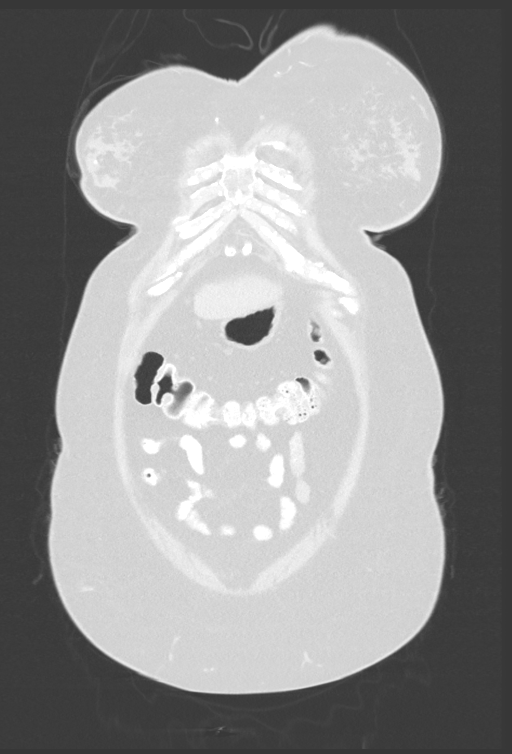
[im 67/167  lung]
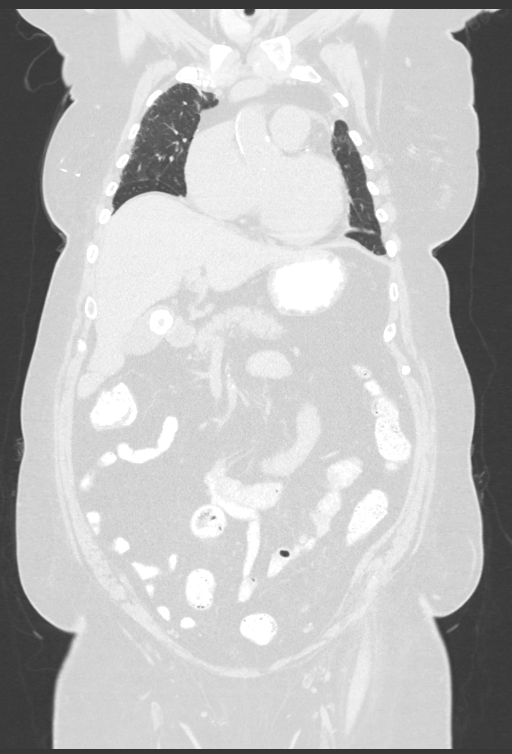
[im 100/167  lung]
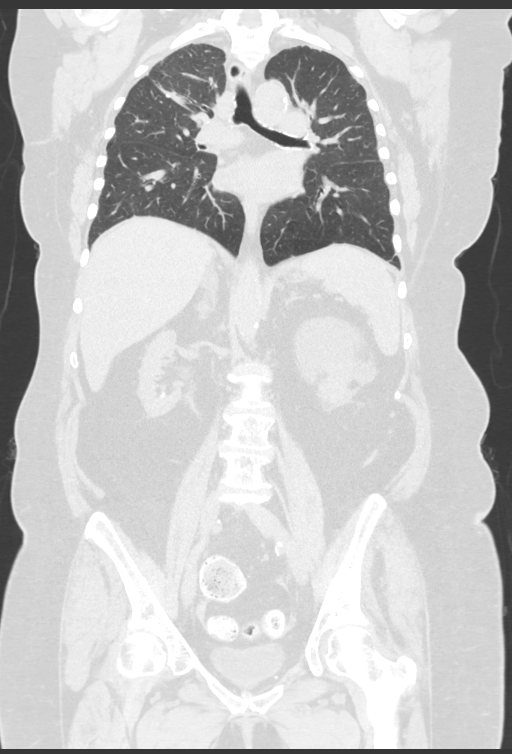

[13 of 36 positions shown; findings below may reference images not displayed]

FINDINGS: CT CHEST FINDINGS

Cardiovascular: The heart size is normal. No substantial pericardial
effusion. Coronary artery calcification is evident. Atherosclerotic
calcification is noted in the wall of the thoracic aorta. Right
Port-A-Cath tip is in the mid right atrium.

Mediastinum/Nodes: No mediastinal lymphadenopathy. No evidence for
gross hilar lymphadenopathy although assessment is limited by the
lack of intravenous contrast on today's study. The esophagus has
normal imaging features. There is no axillary lymphadenopathy.

Lungs/Pleura: Biapical pleuroparenchymal scarring is stable. Staple
line in the right upper lobe with adjacent scarring is similar to
prior. Staple line right lower lobe is compatible with prior wedge
resection. Similar appearance of the part solid 12 mm posterior
right lower lobe pulmonary nodule with 7 mm central nodular
component. Postsurgical changes again noted left upper lobe. 4 mm
perifissural nodule in the left lower lobe (41/4) is unchanged. No
pleural effusion.

Musculoskeletal: No worrisome lytic or sclerotic osseous
abnormality.

CT ABDOMEN PELVIS FINDINGS

Hepatobiliary: No focal abnormality in the liver on this study
without intravenous contrast. Calcified gallstone measures up to
cm. No intrahepatic or extrahepatic biliary dilation.

Pancreas: No focal mass lesion. No dilatation of the main duct. No
intraparenchymal cyst. No peripancreatic edema.

Spleen: No splenomegaly. No focal mass lesion.

Adrenals/Urinary Tract: No adrenal nodule or mass. 4 mm
nonobstructing stone identified lower pole right kidney 10 x 6 mm
stone is identified in the right renal pelvis, as before. No right
ureteral stone. Multiple left renal cysts are similar to prior
measuring up to 8.8 cm maximum size. 3 mm nonobstructing stone in
the lower pole left kidney is stable. No left ureteral stone. No
bladder stone.

Stomach/Bowel: Stomach is unremarkable. No gastric wall thickening.
No evidence of outlet obstruction. Duodenum is normally positioned
as is the ligament of Treitz. No small bowel wall thickening. No
small bowel dilatation. The terminal ileum is normal. The appendix
is not visualized, but there is no edema or inflammation in the
region of the cecum. No gross colonic mass. No colonic wall
thickening.

Vascular/Lymphatic: There is abdominal aortic atherosclerosis
without aneurysm. There is no gastrohepatic or hepatoduodenal
ligament lymphadenopathy. No intraperitoneal or retroperitoneal
lymphadenopathy. No pelvic sidewall lymphadenopathy.

Reproductive: The uterus is surgically absent. There is no adnexal
mass.

Other: No intraperitoneal free fluid.

Musculoskeletal: No worrisome lytic or sclerotic osseous
abnormality.
IMPRESSION: 1. Stable exam.  No new or progressive interval findings.
2. Part solid right lower lobe pulmonary nodule is unchanged.
Continued attention on follow-up recommended.
3. Bilateral nephrolithiasis including a 10 x 6 mm stone in the
right renal pelvis with apparent mild secondary changes in the
peripelvic fat. Overall appearance is not substantially changed
since prior study.
4. Cholelithiasis
5.  Aortic Atherosclerois ([id number]-170.0)

## 2021-01-20 ENCOUNTER — Telehealth: Payer: Self-pay | Admitting: *Deleted

## 2021-01-20 DIAGNOSIS — I4811 Longstanding persistent atrial fibrillation: Secondary | ICD-10-CM

## 2021-01-20 NOTE — Telephone Encounter (Signed)
Description   INR 3.1 (too thin) Goal-2-3  Hold today's dose (12/28/23/22),  then  2.5 mg daily except Wednesday take 1.25 mg (1/2 tab)  Recheck in 1 week

## 2021-01-20 NOTE — Telephone Encounter (Signed)
Fax received mdINR PT/INR self testing service Test date/time 01/20/21 1135 am INR 3.1

## 2021-01-20 NOTE — Telephone Encounter (Signed)
Pt called and aware - understands

## 2021-01-23 ENCOUNTER — Encounter: Payer: Self-pay | Admitting: Internal Medicine

## 2021-02-02 ENCOUNTER — Ambulatory Visit (HOSPITAL_COMMUNITY)
Admission: RE | Admit: 2021-02-02 | Discharge: 2021-02-02 | Disposition: A | Discharge status: Home / Self Care | Payer: HMO | Source: Ambulatory Visit | Attending: Internal Medicine | Admitting: Internal Medicine

## 2021-02-02 ENCOUNTER — Other Ambulatory Visit: Payer: Self-pay

## 2021-02-02 ENCOUNTER — Other Ambulatory Visit: Payer: PPO

## 2021-02-02 ENCOUNTER — Inpatient Hospital Stay: Payer: HMO | Attending: Internal Medicine

## 2021-02-02 ENCOUNTER — Inpatient Hospital Stay: Payer: HMO

## 2021-02-02 DIAGNOSIS — C787 Secondary malignant neoplasm of liver and intrahepatic bile duct: Secondary | ICD-10-CM | POA: Insufficient documentation

## 2021-02-02 DIAGNOSIS — Z853 Personal history of malignant neoplasm of breast: Secondary | ICD-10-CM | POA: Insufficient documentation

## 2021-02-02 DIAGNOSIS — Z85038 Personal history of other malignant neoplasm of large intestine: Secondary | ICD-10-CM | POA: Diagnosis not present

## 2021-02-02 DIAGNOSIS — Z85118 Personal history of other malignant neoplasm of bronchus and lung: Secondary | ICD-10-CM | POA: Diagnosis not present

## 2021-02-02 DIAGNOSIS — Z8673 Personal history of transient ischemic attack (TIA), and cerebral infarction without residual deficits: Secondary | ICD-10-CM | POA: Diagnosis not present

## 2021-02-02 DIAGNOSIS — C182 Malignant neoplasm of ascending colon: Secondary | ICD-10-CM | POA: Insufficient documentation

## 2021-02-02 DIAGNOSIS — C3490 Malignant neoplasm of unspecified part of unspecified bronchus or lung: Secondary | ICD-10-CM | POA: Diagnosis present

## 2021-02-02 DIAGNOSIS — I10 Essential (primary) hypertension: Secondary | ICD-10-CM | POA: Insufficient documentation

## 2021-02-02 DIAGNOSIS — N200 Calculus of kidney: Secondary | ICD-10-CM | POA: Diagnosis not present

## 2021-02-02 DIAGNOSIS — Z95828 Presence of other vascular implants and grafts: Secondary | ICD-10-CM

## 2021-02-02 LAB — CMP (CANCER CENTER ONLY)
ALT: 13 U/L (ref 0–44)
AST: 16 U/L (ref 15–41)
Albumin: 3.9 g/dL (ref 3.5–5.0)
Alkaline Phosphatase: 57 U/L (ref 38–126)
Anion gap: 9 (ref 5–15)
BUN: 34 mg/dL — ABNORMAL HIGH (ref 8–23)
CO2: 25 mmol/L (ref 22–32)
Calcium: 8.9 mg/dL (ref 8.9–10.3)
Chloride: 109 mmol/L (ref 98–111)
Creatinine: 1.64 mg/dL — ABNORMAL HIGH (ref 0.44–1.00)
GFR, Estimated: 32 mL/min — ABNORMAL LOW (ref >60)
Glucose, Bld: 97 mg/dL (ref 70–99)
Potassium: 4.8 mmol/L (ref 3.5–5.1)
Sodium: 143 mmol/L (ref 135–145)
Total Bilirubin: 0.4 mg/dL (ref 0.3–1.2)
Total Protein: 7.2 g/dL (ref 6.5–8.1)

## 2021-02-02 LAB — CBC WITH DIFFERENTIAL (CANCER CENTER ONLY)
Abs Immature Granulocytes: 0.02 10*3/uL (ref 0.00–0.07)
Basophils Absolute: 0.1 10*3/uL (ref 0.0–0.1)
Basophils Relative: 1 %
Eosinophils Absolute: 0.2 10*3/uL (ref 0.0–0.5)
Eosinophils Relative: 3 %
HCT: 44.8 % (ref 36.0–46.0)
Hemoglobin: 14.4 g/dL (ref 12.0–15.0)
Immature Granulocytes: 0 %
Lymphocytes Relative: 19 %
Lymphs Abs: 1.2 10*3/uL (ref 0.7–4.0)
MCH: 28.4 pg (ref 26.0–34.0)
MCHC: 32.1 g/dL (ref 30.0–36.0)
MCV: 88.4 fL (ref 80.0–100.0)
Monocytes Absolute: 0.5 10*3/uL (ref 0.1–1.0)
Monocytes Relative: 8 %
Neutro Abs: 4.3 10*3/uL (ref 1.7–7.7)
Neutrophils Relative %: 69 %
Platelet Count: 150 10*3/uL (ref 150–400)
RBC: 5.07 MIL/uL (ref 3.87–5.11)
RDW: 13.3 % (ref 11.5–15.5)
WBC Count: 6.3 10*3/uL (ref 4.0–10.5)
nRBC: 0 % (ref 0.0–0.2)

## 2021-02-02 LAB — CEA (IN HOUSE-CHCC): CEA (CHCC-In House): 2.84 ng/mL (ref 0.00–5.00)

## 2021-02-02 MED ORDER — HEPARIN SOD (PORK) LOCK FLUSH 100 UNIT/ML IV SOLN
500.0000 [IU] | Freq: Once | INTRAVENOUS | Status: DC | PRN
  Filled 2021-02-02: qty 5

## 2021-02-02 MED ORDER — SODIUM CHLORIDE 0.9% FLUSH
10.0000 mL | INTRAVENOUS | Status: DC | PRN
  Administered 2021-02-02: 10 mL
  Filled 2021-02-02: qty 10

## 2021-02-02 MED ORDER — ALTEPLASE 2 MG IJ SOLR
2.0000 mg | Freq: Once | INTRAMUSCULAR | Status: DC | PRN
  Filled 2021-02-02: qty 2

## 2021-02-02 MED ORDER — HEPARIN SOD (PORK) LOCK FLUSH 100 UNIT/ML IV SOLN
INTRAVENOUS | Status: AC
  Filled 2021-02-02: qty 5

## 2021-02-04 ENCOUNTER — Inpatient Hospital Stay: Payer: HMO | Admitting: Internal Medicine

## 2021-02-04 ENCOUNTER — Other Ambulatory Visit: Payer: Self-pay

## 2021-02-04 ENCOUNTER — Inpatient Hospital Stay (HOSPITAL_BASED_OUTPATIENT_CLINIC_OR_DEPARTMENT_OTHER): Payer: HMO

## 2021-02-04 VITALS — BP 124/60 | HR 72 | Temp 98.1°F | Resp 16 | Ht 71.0 in | Wt 274.4 lb

## 2021-02-04 DIAGNOSIS — C3431 Malignant neoplasm of lower lobe, right bronchus or lung: Secondary | ICD-10-CM | POA: Diagnosis not present

## 2021-02-04 DIAGNOSIS — Z23 Encounter for immunization: Secondary | ICD-10-CM | POA: Diagnosis not present

## 2021-02-04 DIAGNOSIS — J449 Chronic obstructive pulmonary disease, unspecified: Secondary | ICD-10-CM | POA: Diagnosis not present

## 2021-02-04 DIAGNOSIS — C189 Malignant neoplasm of colon, unspecified: Secondary | ICD-10-CM | POA: Diagnosis not present

## 2021-02-04 DIAGNOSIS — C3490 Malignant neoplasm of unspecified part of unspecified bronchus or lung: Secondary | ICD-10-CM | POA: Diagnosis not present

## 2021-02-04 DIAGNOSIS — C182 Malignant neoplasm of ascending colon: Secondary | ICD-10-CM | POA: Diagnosis not present

## 2021-02-04 NOTE — Progress Notes (Signed)
   Covid-19 Vaccination Clinic  Name:  DEVAEH AMADI    MRN: [id number] DOB: 1941-02-02  02/04/2021  Ms. Froberg was observed post Covid-19 immunization for 15 minutes without incident. She was provided with Vaccine Information Sheet and instruction to access the V-Safe system.   Ms. Dolinski was instructed to call 911 with any severe reactions post vaccine: Marland Kitchen Difficulty breathing  . Swelling of face and throat  . A fast heartbeat  . A bad rash all over body  . Dizziness and weakness
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
## 2021-02-05 ENCOUNTER — Other Ambulatory Visit: Payer: Self-pay | Admitting: *Deleted

## 2021-02-05 DIAGNOSIS — I4891 Unspecified atrial fibrillation: Secondary | ICD-10-CM

## 2021-02-05 MED ORDER — WARFARIN SODIUM 2.5 MG PO TABS: ORAL_TABLET | ORAL | 0 refills | Status: DC

## 2021-03-18 ENCOUNTER — Other Ambulatory Visit: Payer: Self-pay | Admitting: Family

## 2021-03-18 DIAGNOSIS — E785 Hyperlipidemia, unspecified: Secondary | ICD-10-CM

## 2021-03-24 ENCOUNTER — Telehealth: Payer: Self-pay | Admitting: *Deleted

## 2021-03-24 DIAGNOSIS — I4811 Longstanding persistent atrial fibrillation: Secondary | ICD-10-CM

## 2021-03-24 NOTE — Telephone Encounter (Signed)
Patient states that she is currently taking medication this way. Please review

## 2021-03-24 NOTE — Telephone Encounter (Signed)
INR 3.1 (too thin) Goal-2-3  Hold today's dose (03/24/21),  then  2.5 mg daily except Wednesday take 1.25 mg (1/2 tab)  Recheck in 1 week

## 2021-03-24 NOTE — Telephone Encounter (Signed)
Fax received mdINR PT/INR self testing service Test date/time 03/24/21 237 pm INR 3.1

## 2021-04-15 IMAGING — CT CT CHEST W/O CM
2 of 3 series · 14 of 36 positions shown, 17 images · non-contrast
Comparison: 06/28/2019

CLINICAL DATA: Non-small cell lung cancer diagnosed in 5505, right
breast cancer in 0056. History of colon cancer with metastatic
disease to the liver in 6188 history of both chemo radiotherapy and
immunotherapy.

EXAM:
CT CHEST, ABDOMEN AND PELVIS WITHOUT CONTRAST
TECHNIQUE: Multidetector CT imaging of the chest, abdomen and pelvis was
performed following the standard protocol without IV contrast.

[Series 4: coronals · coronal · 1.06mm/px · 3 of 153 slices shown]
[im 31/153  lung]
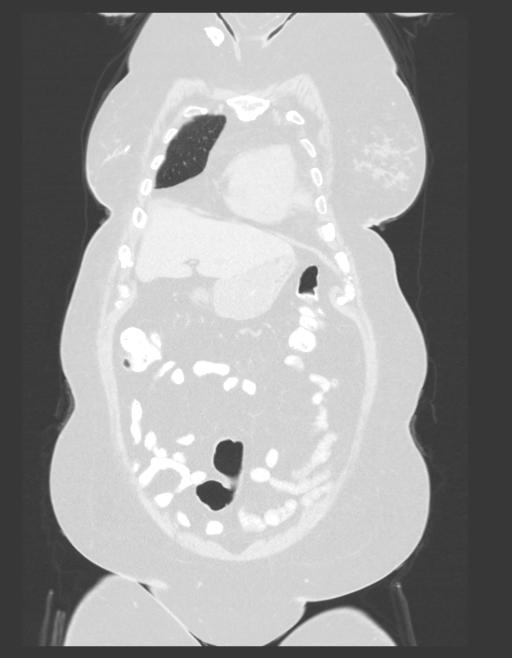
[im 61/153  lung]
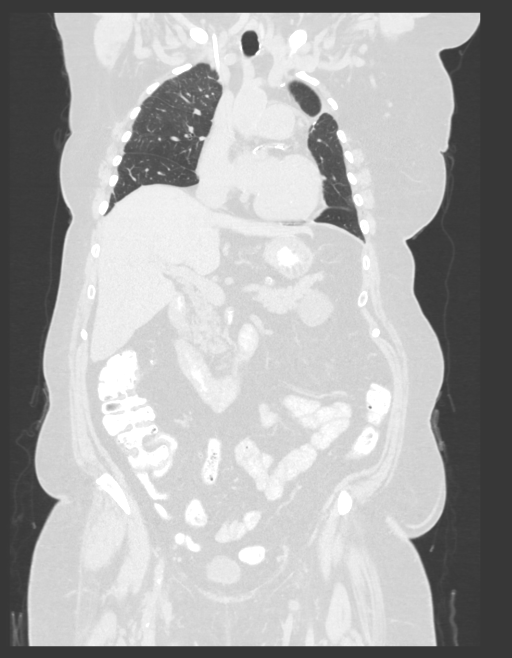
[im 92/153  lung]
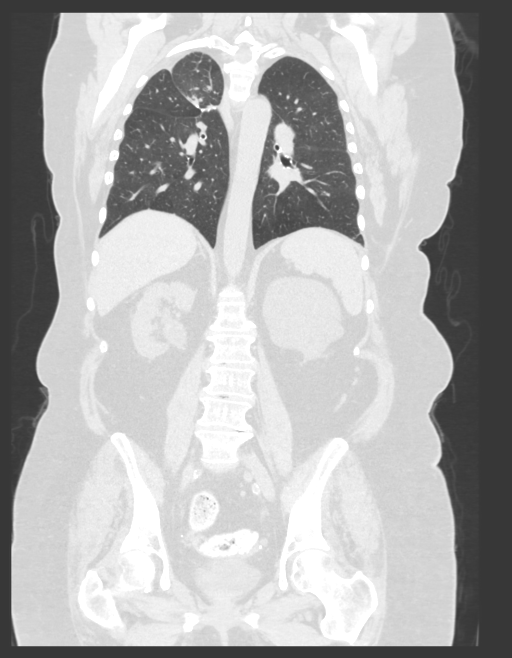

[Series 6: lung · axial · 0.88mm/px · z∈[+1394,+1638]mm · 11 of 144 slices shown, 14 images]
[im 11/144  mediastinal]
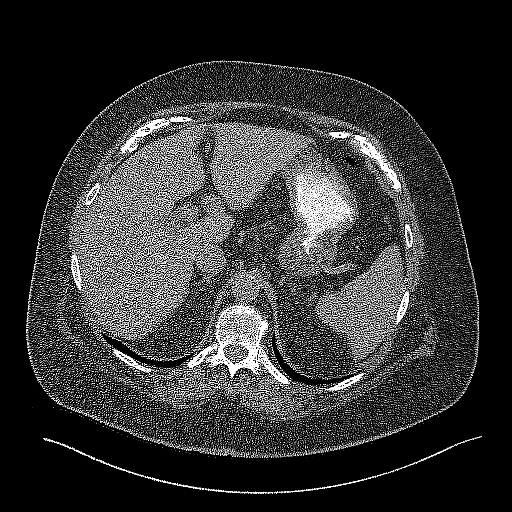
[im 11/144  lung]
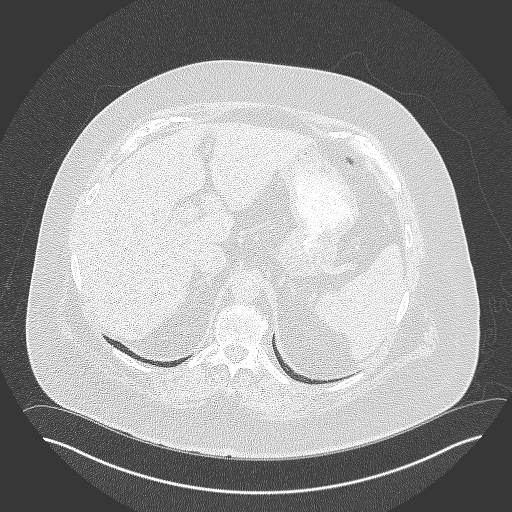
[im 22/144  lung]
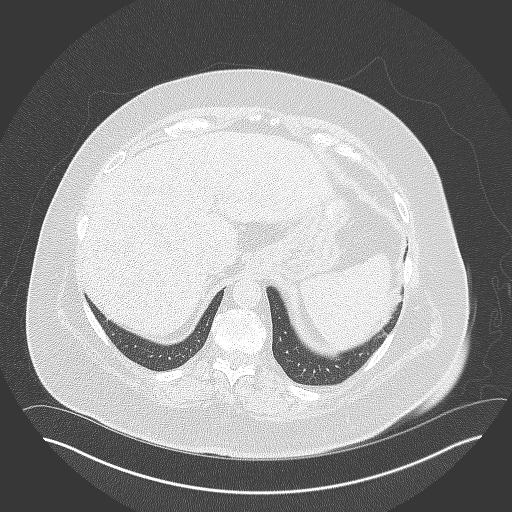
[im 32/144  lung]
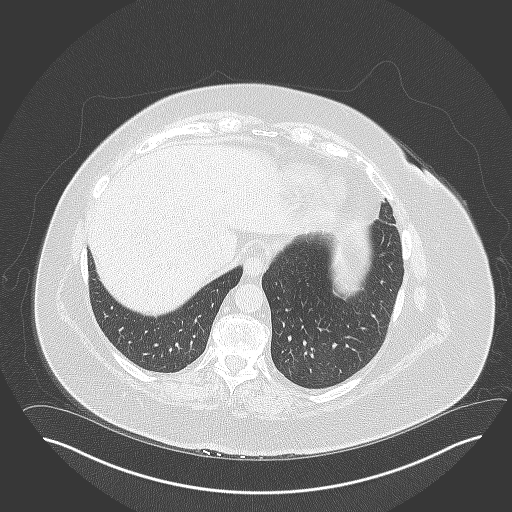
[im 48/144  lung]
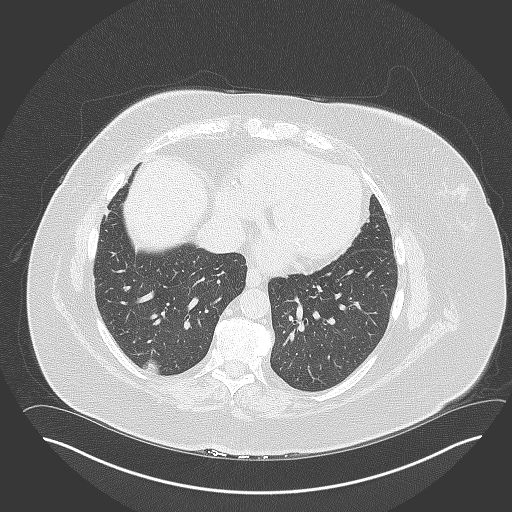
[im 59/144  mediastinal]
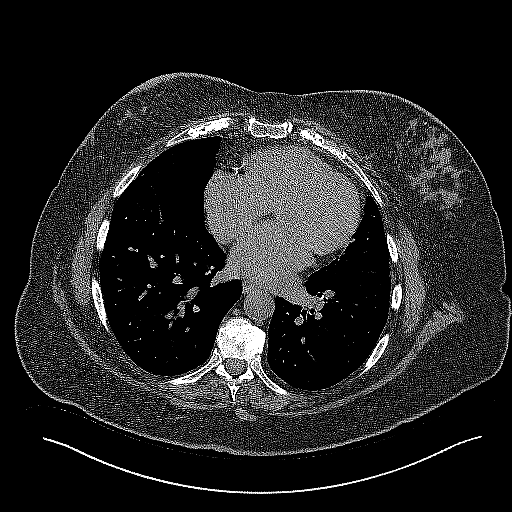
[im 59/144  lung]
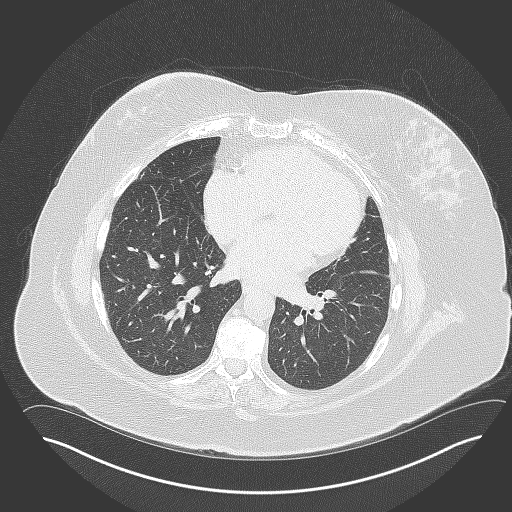
[im 75/144  lung]
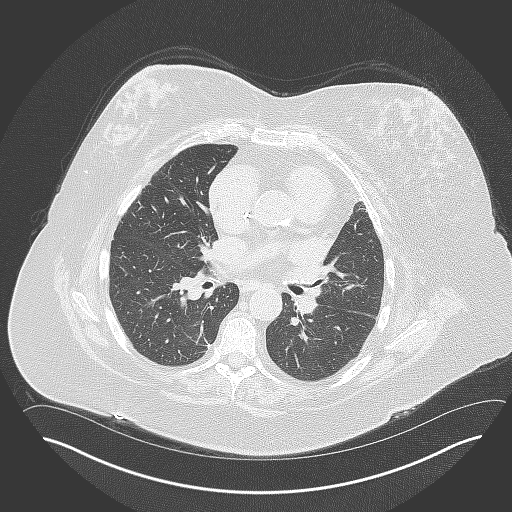
[im 85/144  lung]
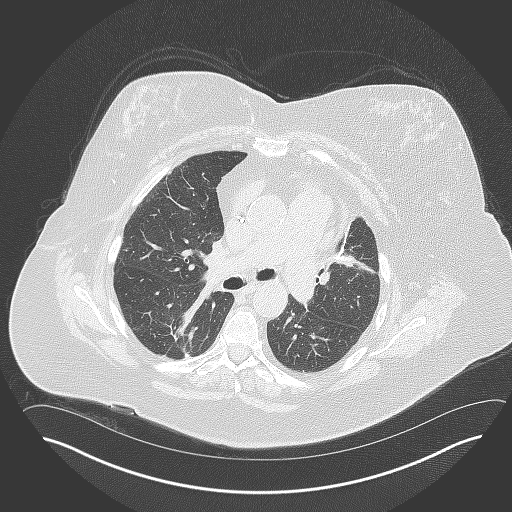
[im 96/144  lung]
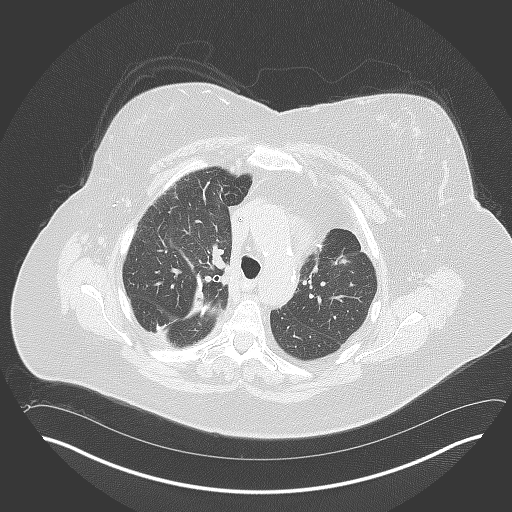
[im 112/144  mediastinal]
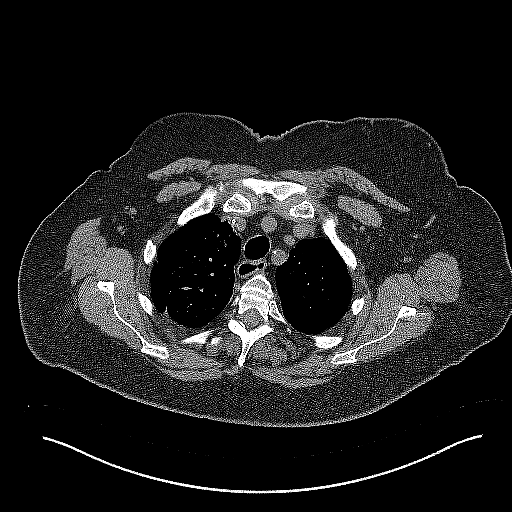
[im 112/144  lung]
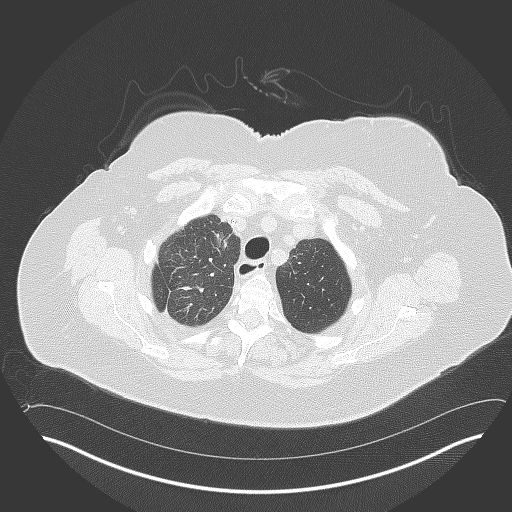
[im 122/144  lung]
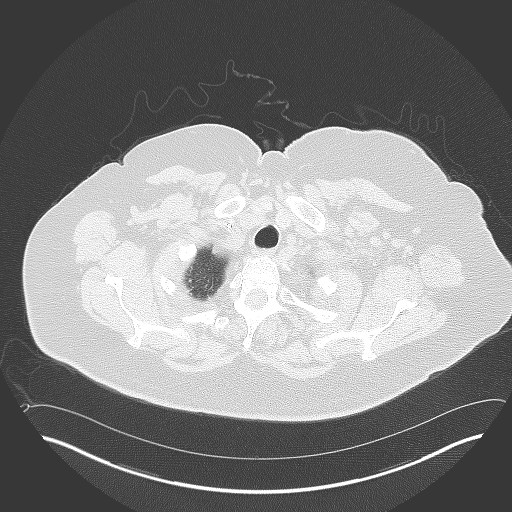
[im 133/144  lung]
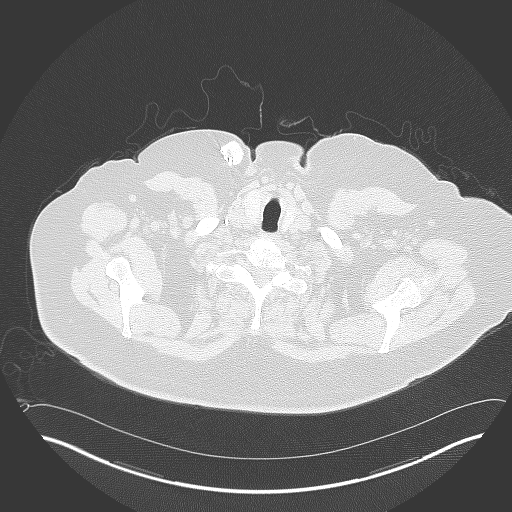

[14 of 36 positions shown; findings below may reference images not displayed]

FINDINGS: CT CHEST FINDINGS

Cardiovascular: Right-sided Port-A-Cath terminates in the upper
right atrium. Heart size remains enlarged with signs of coronary
artery disease. Generalized atherosclerotic changes throughout the
thoracic aorta are unchanged.

Mediastinum/Nodes: No signs of adenopathy in the mediastinum. Hilar
structure shows limited assessment given lack of intravenous
contrast.

No axillary or supraclavicular lymphadenopathy.

Lungs/Pleura: Post multiple wedge resections in the right chest and
and findings of lung resection in the left upper lobe are stable.
Nodule with surrounding ground-glass in the right lower lobe
measures approximately 8 mm, unchanged since the prior exam and at
least into all June 30, 2018. No consolidation or pleural effusion.

Musculoskeletal: No signs of chest wall mass. Changes of right
breast lumpectomy and axillary dissection similar to prior study.

CT ABDOMEN PELVIS FINDINGS

Hepatobiliary: No signs of focal hepatic lesion on noncontrast
evaluation. Large calcified gallstone in the gallbladder neck. No
pericholecystic stranding. No signs of biliary ductal dilation.

Pancreas: Unremarkable. No pancreatic ductal dilatation or
surrounding inflammatory changes.

Spleen: Normal in size without focal abnormality.

Adrenals/Urinary Tract: Normal appearance of bilateral adrenal
glands. Moderate calculus in the lower pole of the right kidney
measuring 9 mm larger than on prior study where it measured 5 mm.
There was a calculus in the right renal pelvis that has since
passed. No ureteral calculi are demonstrated on today's study.

Punctate calculus in the lower pole left kidney. Not changed from
previous exam.

Large left renal cysts are unchanged.  Largest approximately 0.9 cm.

Stomach/Bowel: No signs of acute gastrointestinal process. Appendix
is normal.

Vascular/Lymphatic: Dense atherosclerotic calcifications throughout
the abdominal aorta. No aneurysm. No signs of upper abdominal
lymphadenopathy. No pelvic adenopathy.

Reproductive: Post hysterectomy. Normal appearance of the urinary
bladder.

Other: No sign of free air or fluid in the abdomen or pelvis.

Musculoskeletal: No sign of acute or destructive bone process.
Spinal degenerative changes.
IMPRESSION: 1. Stable exam, no signs of recurrent disease.
2. Right lower lobe part solid nodule is unchanged over multiple
priors, attention on follow-up.
3. Interval passage of right renal pelvic calculus with enlargement
of right lower pole calculus.
4. Large gallstone in the gallbladder.
5. Atherosclerosis and coronary artery disease.
6.  Aortic Atherosclerosis ([id number]).

## 2021-04-15 IMAGING — CT CT ABD-PELV W/O CM
2 of 4 series · 14 of 36 positions shown, 17 images · non-contrast
Comparison: 06/28/2019

CLINICAL DATA: Non-small cell lung cancer diagnosed in 5505, right
breast cancer in 0056. History of colon cancer with metastatic
disease to the liver in 6188 history of both chemo radiotherapy and
immunotherapy.

EXAM:
CT CHEST, ABDOMEN AND PELVIS WITHOUT CONTRAST
TECHNIQUE: Multidetector CT imaging of the chest, abdomen and pelvis was
performed following the standard protocol without IV contrast.

[Series 2: cap w/o · axial · non-contrast · 0.88mm/px · z∈[+1050,+1600]mm · 11 of 134 slices shown, 14 images]
[im 12/134  mediastinal]
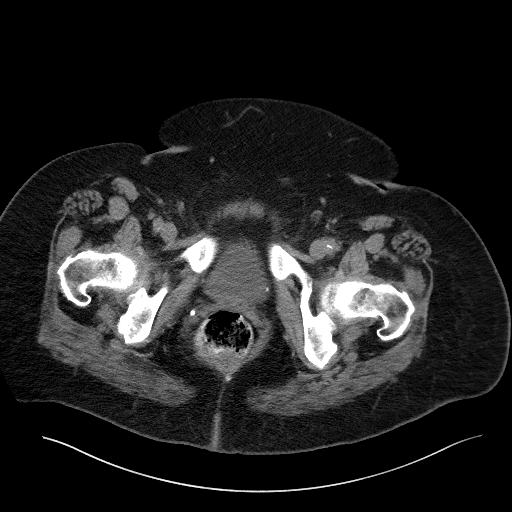
[im 12/134  lung]
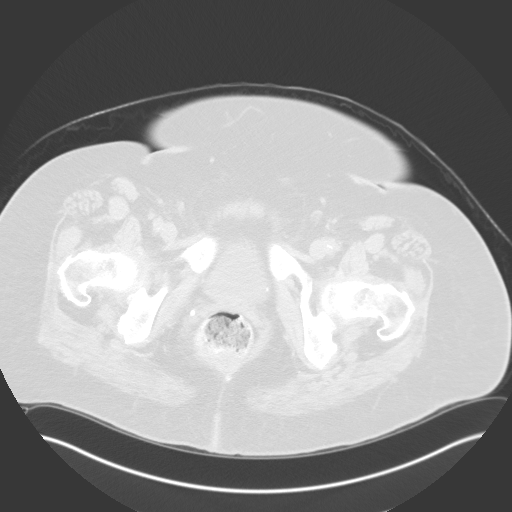
[im 23/134  lung]
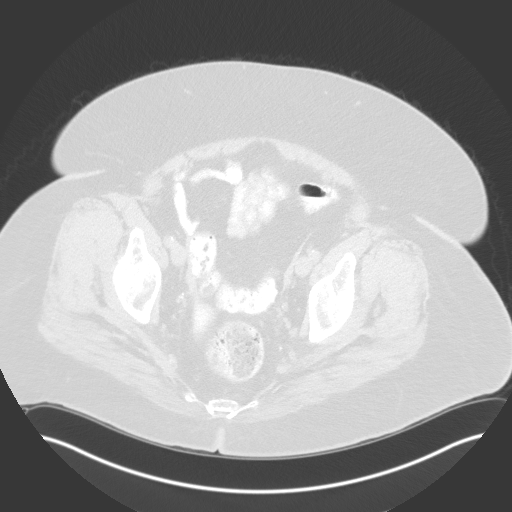
[im 34/134  lung]
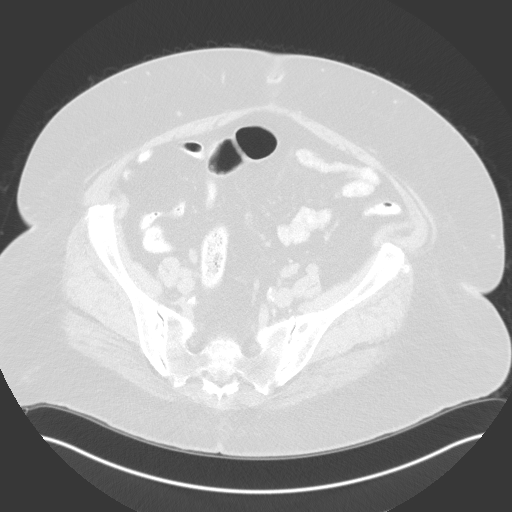
[im 45/134  lung]
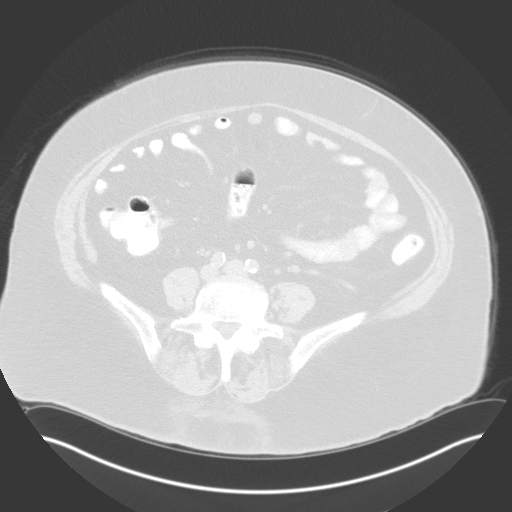
[im 56/134  mediastinal]
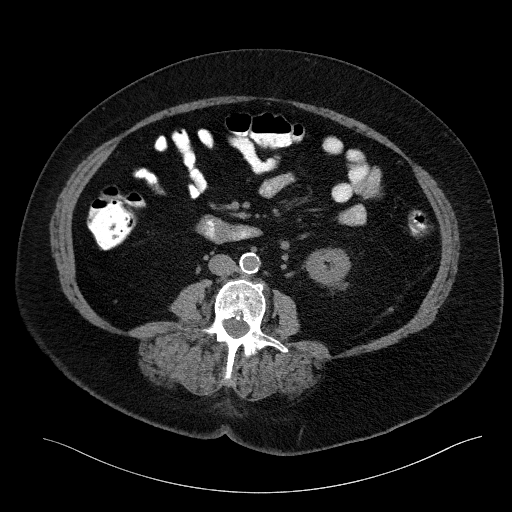
[im 56/134  lung]
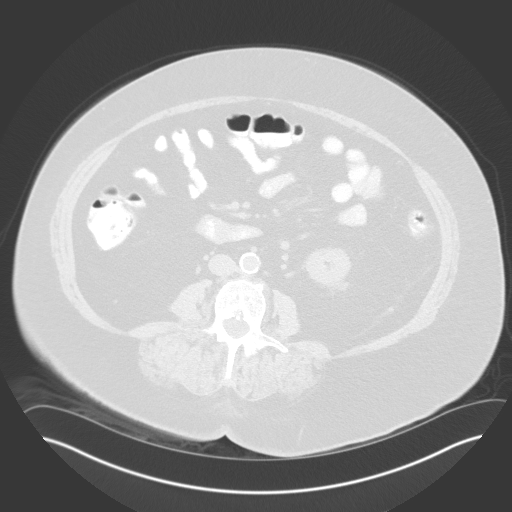
[im 67/134  lung]
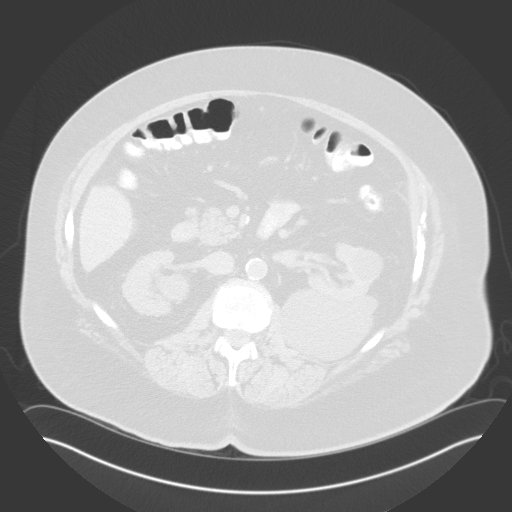
[im 78/134  lung]
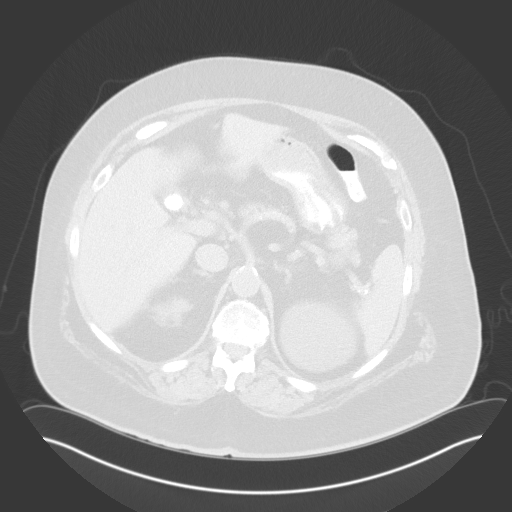
[im 89/134  lung]
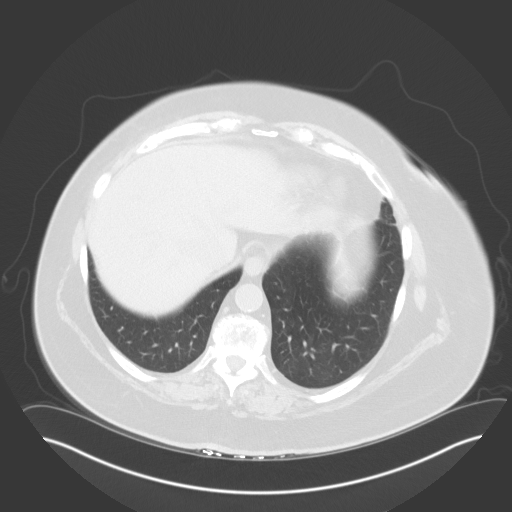
[im 100/134  mediastinal]
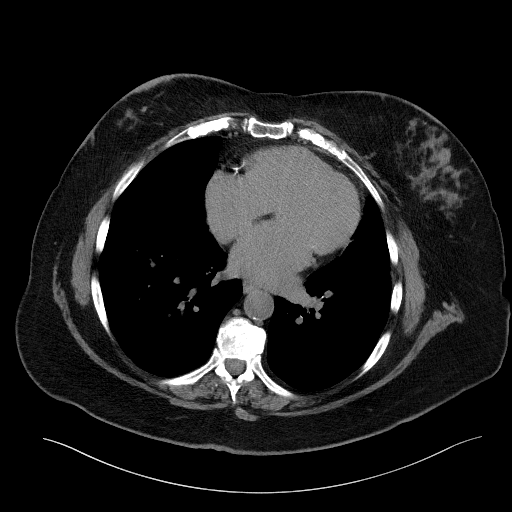
[im 100/134  lung]
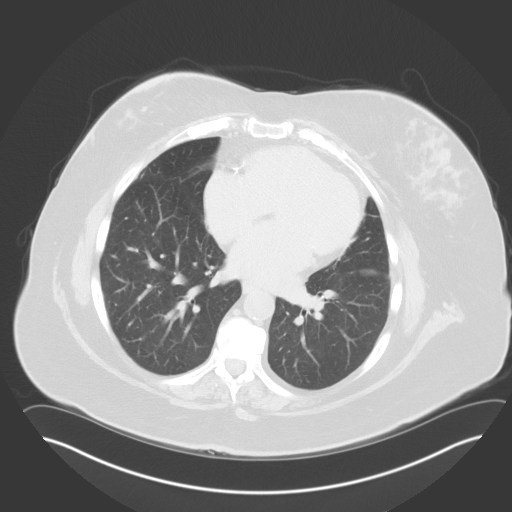
[im 111/134  lung]
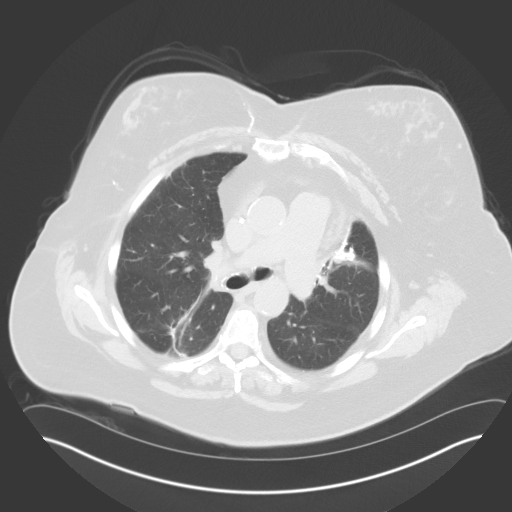
[im 122/134  lung]
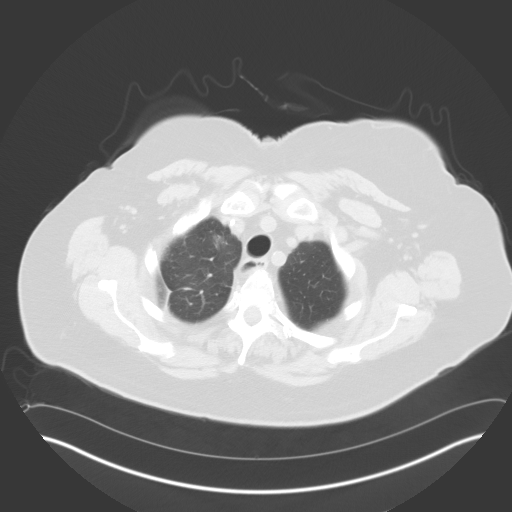

[Series 4: coronals · coronal · 1.06mm/px · 3 of 153 slices shown]
[im 31/153  lung]
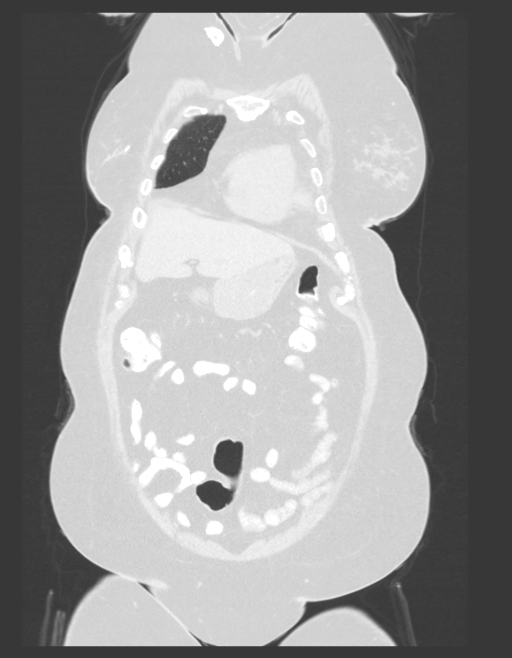
[im 61/153  lung]
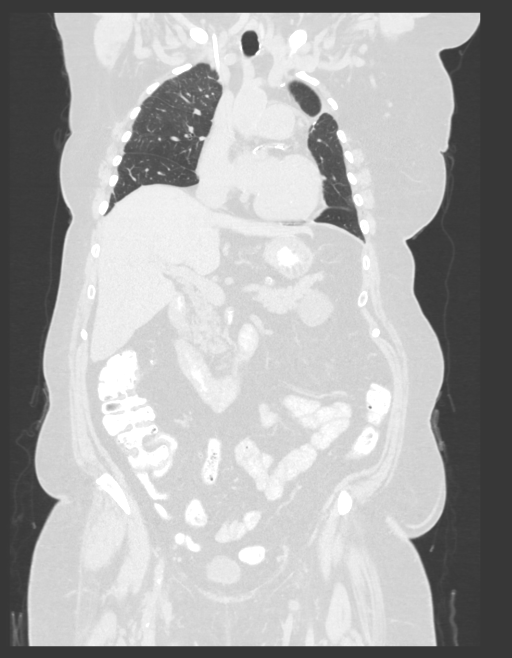
[im 92/153  lung]
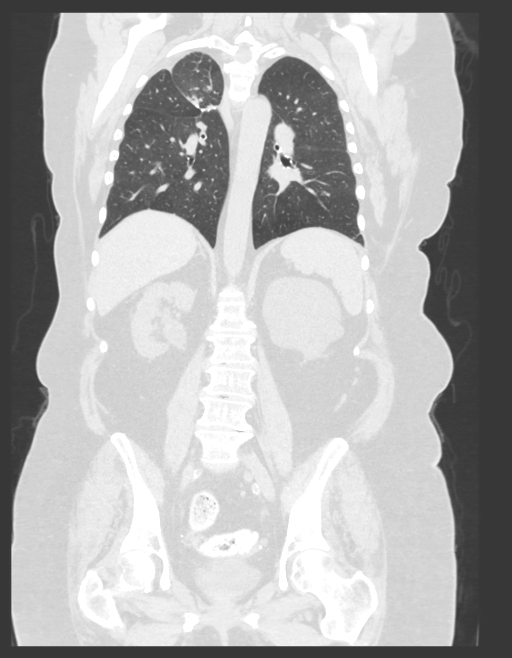

[14 of 36 positions shown; findings below may reference images not displayed]

FINDINGS: CT CHEST FINDINGS

Cardiovascular: Right-sided Port-A-Cath terminates in the upper
right atrium. Heart size remains enlarged with signs of coronary
artery disease. Generalized atherosclerotic changes throughout the
thoracic aorta are unchanged.

Mediastinum/Nodes: No signs of adenopathy in the mediastinum. Hilar
structure shows limited assessment given lack of intravenous
contrast.

No axillary or supraclavicular lymphadenopathy.

Lungs/Pleura: Post multiple wedge resections in the right chest and
and findings of lung resection in the left upper lobe are stable.
Nodule with surrounding ground-glass in the right lower lobe
measures approximately 8 mm, unchanged since the prior exam and at
least into all June 30, 2018. No consolidation or pleural effusion.

Musculoskeletal: No signs of chest wall mass. Changes of right
breast lumpectomy and axillary dissection similar to prior study.

CT ABDOMEN PELVIS FINDINGS

Hepatobiliary: No signs of focal hepatic lesion on noncontrast
evaluation. Large calcified gallstone in the gallbladder neck. No
pericholecystic stranding. No signs of biliary ductal dilation.

Pancreas: Unremarkable. No pancreatic ductal dilatation or
surrounding inflammatory changes.

Spleen: Normal in size without focal abnormality.

Adrenals/Urinary Tract: Normal appearance of bilateral adrenal
glands. Moderate calculus in the lower pole of the right kidney
measuring 9 mm larger than on prior study where it measured 5 mm.
There was a calculus in the right renal pelvis that has since
passed. No ureteral calculi are demonstrated on today's study.

Punctate calculus in the lower pole left kidney. Not changed from
previous exam.

Large left renal cysts are unchanged.  Largest approximately 0.9 cm.

Stomach/Bowel: No signs of acute gastrointestinal process. Appendix
is normal.

Vascular/Lymphatic: Dense atherosclerotic calcifications throughout
the abdominal aorta. No aneurysm. No signs of upper abdominal
lymphadenopathy. No pelvic adenopathy.

Reproductive: Post hysterectomy. Normal appearance of the urinary
bladder.

Other: No sign of free air or fluid in the abdomen or pelvis.

Musculoskeletal: No sign of acute or destructive bone process.
Spinal degenerative changes.
IMPRESSION: 1. Stable exam, no signs of recurrent disease.
2. Right lower lobe part solid nodule is unchanged over multiple
priors, attention on follow-up.
3. Interval passage of right renal pelvic calculus with enlargement
of right lower pole calculus.
4. Large gallstone in the gallbladder.
5. Atherosclerosis and coronary artery disease.
6.  Aortic Atherosclerosis ([id number]).

## 2021-04-29 ENCOUNTER — Telehealth: Payer: Self-pay | Admitting: Medical Oncology

## 2021-04-29 NOTE — Telephone Encounter (Signed)
"  Please ask Dr Julien Nordmann to look at my last CT . I found a knot on the right side of my left breast.

## 2021-04-30 ENCOUNTER — Telehealth: Payer: Self-pay

## 2021-04-30 NOTE — Telephone Encounter (Signed)
Pt telephone call: "Please ask Dr Julien Nordmann to look at my last CT . I found a knot on the right side of my left breast".   Per [name]: "he last scan did not show any suspicious lesion in the breast but CT scan is not perfect for detection of breast cancer unless it is a big lesion. Thank you. "  I have called the pt and advised as indcated. Pt was also encouraged to follow-up with her GYN or PCP regarding obtaining a mammography.

## 2021-05-02 IMAGING — DX DG CHEST 1V PORT
1 series · 1 of 1 positions shown · non-contrast
Comparison: May 27, 2017 chest x-ray and September 28, 2019 CT scan

CLINICAL DATA: Shortness of breath

EXAM:
PORTABLE CHEST 1 VIEW

[chest ap]
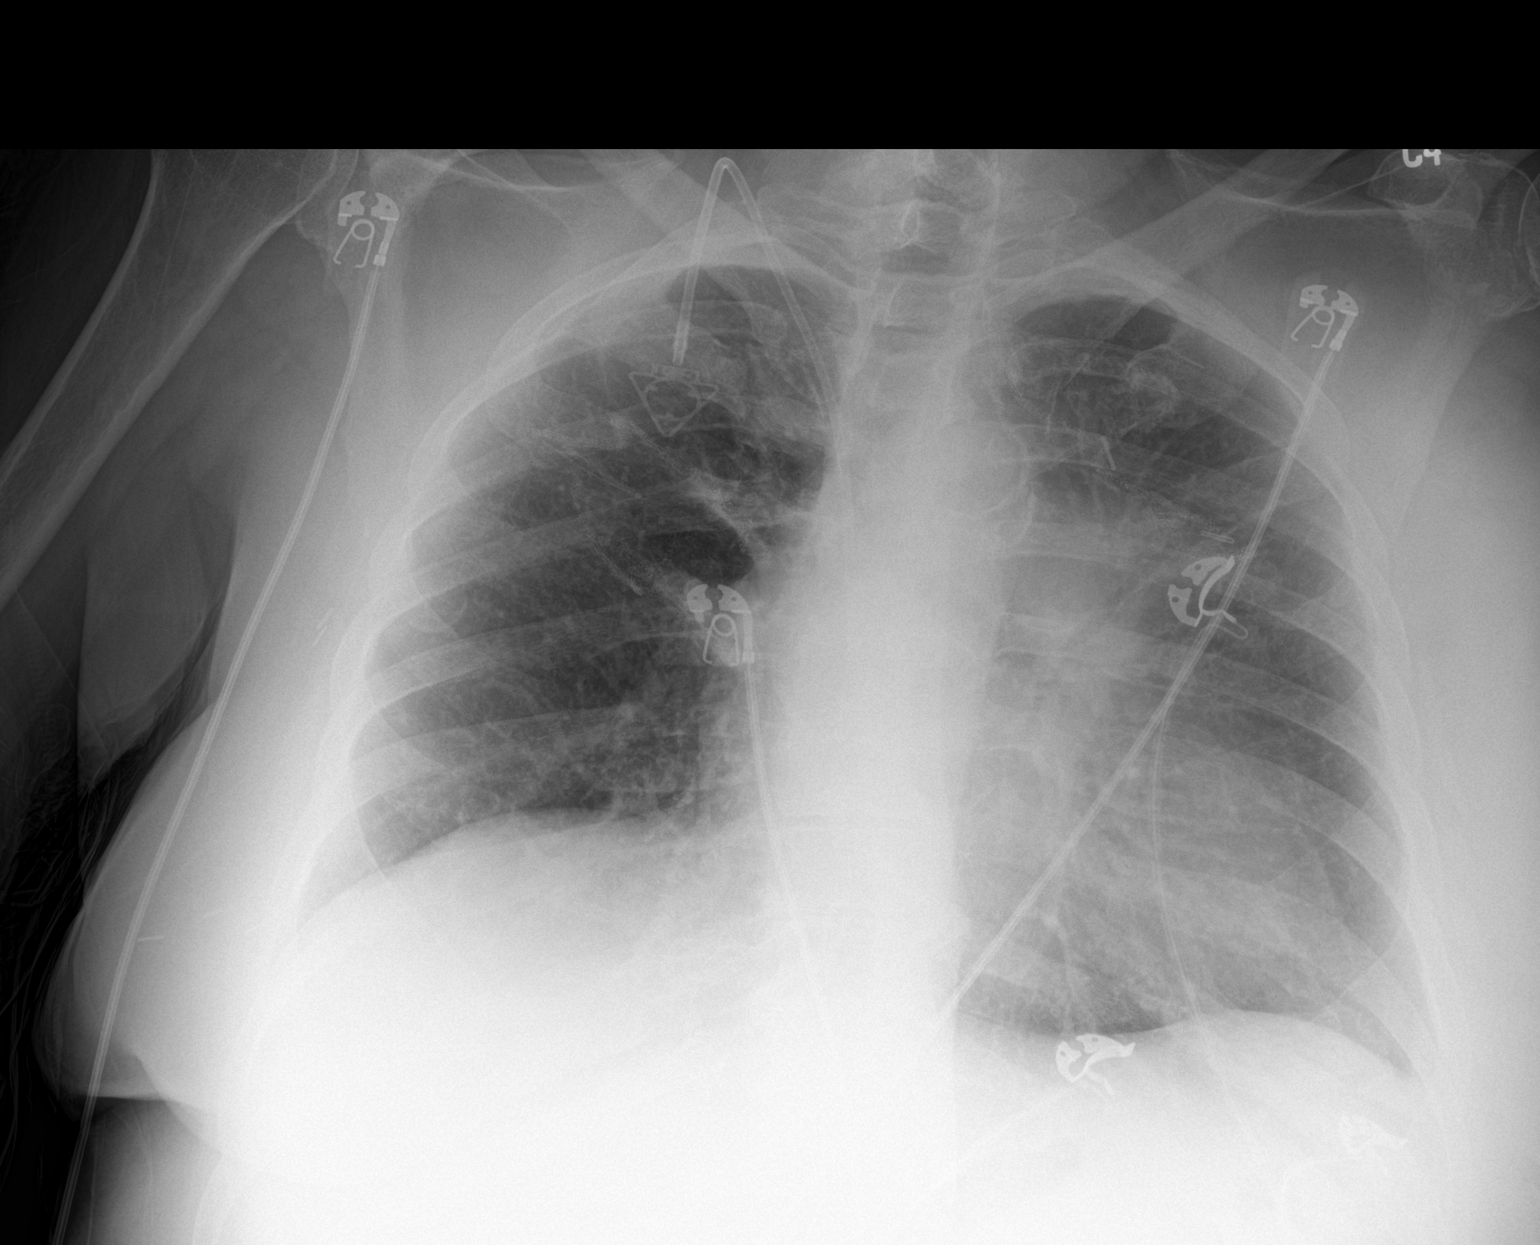

[1 of 1 positions shown; findings below may reference images not displayed]

FINDINGS: Postoperative changes seen on the left. Stable right Port-A-Cath. No
change in the cardiomediastinal silhouette. No nodules, masses, or
focal infiltrates.
IMPRESSION: No acute interval change.  Postoperative changes on the left.

## 2021-05-18 ENCOUNTER — Other Ambulatory Visit: Payer: Self-pay | Admitting: *Deleted

## 2021-05-19 MED ORDER — FUROSEMIDE 20 MG PO TABS: 20.0000 mg | ORAL_TABLET | Freq: Every day | ORAL | 4 refills | Status: DC

## 2021-05-22 ENCOUNTER — Other Ambulatory Visit: Payer: Self-pay

## 2021-05-22 ENCOUNTER — Encounter: Payer: Self-pay | Admitting: Internal Medicine

## 2021-05-22 ENCOUNTER — Encounter: Payer: Self-pay | Admitting: Family

## 2021-05-22 ENCOUNTER — Ambulatory Visit (INDEPENDENT_AMBULATORY_CARE_PROVIDER_SITE_OTHER): Payer: HMO | Admitting: Family

## 2021-05-22 VITALS — BP 124/71 | HR 66 | Temp 97.7°F | Ht 71.0 in | Wt 274.8 lb

## 2021-05-22 DIAGNOSIS — C189 Malignant neoplasm of colon, unspecified: Secondary | ICD-10-CM | POA: Diagnosis not present

## 2021-05-22 DIAGNOSIS — N63 Unspecified lump in unspecified breast: Secondary | ICD-10-CM

## 2021-05-22 DIAGNOSIS — C229 Malignant neoplasm of liver, not specified as primary or secondary: Secondary | ICD-10-CM

## 2021-05-22 DIAGNOSIS — C3431 Malignant neoplasm of lower lobe, right bronchus or lung: Secondary | ICD-10-CM

## 2021-05-22 DIAGNOSIS — Z853 Personal history of malignant neoplasm of breast: Secondary | ICD-10-CM

## 2021-05-22 DIAGNOSIS — I5033 Acute on chronic diastolic (congestive) heart failure: Secondary | ICD-10-CM | POA: Diagnosis not present

## 2021-05-22 MED ORDER — FUROSEMIDE 20 MG PO TABS: 20.0000 mg | ORAL_TABLET | Freq: Two times a day (BID) | ORAL | 4 refills | Status: DC

## 2021-05-22 NOTE — Patient Instructions (Signed)
Heart Failure, Diagnosis  Heart failure is a condition in which the heart has trouble pumping blood. This may mean that the heart cannot pump enough blood out to the body or that the heart does not fill up with enough blood. For some people with heart failure, fluid may back up into the lungs. There may also be swelling (edema) in the lower legs. Heart failure is usually a long-term (chronic) condition. It is important for you to take good care of yourself and follow the treatment plan from your health care provider. What are the causes? This condition may be caused by:  High blood pressure (hypertension). Hypertension causes the heart muscle to work harder than normal.  Coronary artery disease, or CAD. CAD is the buildup of cholesterol and fat (plaque) in the arteries of the heart.  Heart attack, also called myocardial infarction. This injures the heart muscle, making it hard for the heart to pump blood.  Abnormal heart valves. The valves do not open and close properly, forcing the heart to pump harder to keep the blood flowing.  Heart muscle disease, inflammation, or infection (cardiomyopathy or myocarditis). This is damage to the heart muscle. It can increase the risk of heart failure.  Lung disease. The heart works harder when the lungs are not healthy. What increases the risk? The risk of heart failure increases as a person ages. This condition is also more likely to develop in people who:  Are obese.  Are female.  Use tobacco or nicotine products.  Abuse alcohol or drugs.  Have taken medicines that can damage the heart, such as chemotherapy drugs.  Have any of these conditions: ? Diabetes. ? Abnormal heart rhythms. ? Thyroid problems. ? Low blood counts (anemia). ? Chronic kidney disease.  Have a family history of heart failure. What are the signs or symptoms? Symptoms of this condition include:  Shortness of breath with activity, such as when climbing stairs.  A cough  that does not go away.  Swelling of the feet, ankles, legs, or abdomen.  Losing or gaining weight for no reason.  Trouble breathing when lying flat.  Waking from sleep because of the need to sit up and get more air.  Rapid heartbeat.  Tiredness (fatigue) and loss of energy.  Feeling light-headed, dizzy, or close to fainting.  Nausea or loss of appetite.  Waking up more often during the night to urinate (nocturia).  Confusion. How is this diagnosed? This condition is diagnosed based on:  Your medical history, symptoms, and a physical exam.  Diagnostic tests, which may include: ? Echocardiogram. ? Electrocardiogram (ECG). ? Chest X-ray. ? Blood tests. ? Exercise stress test. ? Cardiac MRI. ? Cardiac catheterization and angiogram. ? Radionuclide scans. How is this treated? Treatment for this condition is aimed at managing the symptoms of heart failure. Medicines Treatment may include medicines that:  Help lower blood pressure by relaxing (dilating) the blood vessels. These medicines are called ACE inhibitors (angiotensin-converting enzyme), ARBs (angiotensin receptor blockers), or vasodilators.  Cause the kidneys to remove salt and water from the blood through urination (diuretics).  Improve heart muscle strength and prevent the heart from beating too fast (beta blockers).  Increase the force of the heartbeat (digoxin).  Lower heart rates. Certain diabetes medicines (SGLT-2 inhibitors) may also be used in treatment. Healthy behavior changes Treatment may also include making healthy lifestyle changes, such as:  Reaching and staying at a healthy weight.  Not using tobacco or nicotine products.  Eating heart-healthy foods.  Limiting or avoiding alcohol.  Stopping the use of illegal drugs.  Being physically active.  Participating in a cardiac rehabilitation program, which is a treatment program to improve your health and well-being through exercise training,  education, and counseling. Other treatments Other treatments may include:  Procedures to open blocked arteries or repair damaged valves.  Placing a pacemaker to improve heart function (cardiac resynchronization therapy).  Placing a device to treat serious abnormal heart rhythms (implantable cardioverter defibrillator, or ICD).  Placing a device to improve the pumping ability of the heart (left ventricular assist device, or LVAD).  Receiving a healthy heart from a donor (heart transplant). This is done when other treatments have not helped. Follow these instructions at home:  Manage other health conditions as told by your health care provider. These may include hypertension, diabetes, thyroid disease, or abnormal heart rhythms.  Get ongoing education and support as needed. Learn as much as you can about heart failure.  Keep all follow-up visits. This is important. Summary  Heart failure is a condition in which the heart has trouble pumping blood.  This condition is commonly caused by high blood pressure and other diseases of the heart and lungs.  Symptoms of this condition include shortness of breath, tiredness (fatigue), nausea, and swelling of the feet, ankles, legs, or abdomen.  Treatments for this condition may include medicines, lifestyle changes, and surgery.  Manage other health conditions as told by your health care provider. This information is not intended to replace advice given to you by your health care provider. Make sure you discuss any questions you have with your health care provider. Document Revised: 07/05/2020 Document Reviewed: 07/05/2020 Elsevier Patient Education  [place].

## 2021-05-22 NOTE — Progress Notes (Signed)
Subjective:    Patient ID: Tammie [name], female    DOB: [birth year], 80 y.o.   MRN: [id number]  Chief Complaint  Patient presents with  . Mass    On left breast   . Congestive Heart Failure    Gaining 4 lbs    Pt presents to the office today with complaints of a mass on left breast about three weeks ago. She has hx of right breast cancer 2012. She currently has colon cancer, liver, and lung cancer. States three different primary cancers. She is followed by Oncologists every 6 months.   Her last mammogram was 03/01/16.   She reports she has gained 4 lb over the last 4 days.  Congestive Heart Failure Presents for follow-up visit. Associated symptoms include edema, fatigue and shortness of breath. The symptoms have been worsening.      Review of Systems  Constitutional: Positive for fatigue.  Respiratory: Positive for shortness of breath.   All other systems reviewed and are negative.      Objective:   Physical Exam Vitals reviewed.  Constitutional:      General: She is not in acute distress.    Appearance: She is well-developed. She is obese.  HENT:     Head: Normocephalic and atraumatic.     Right Ear: Tympanic membrane normal.     Left Ear: Tympanic membrane normal.  Eyes:     Pupils: Pupils are equal, round, and reactive to light.  Neck:     Thyroid: No thyromegaly.  Cardiovascular:     Rate and Rhythm: Normal rate and regular rhythm.     Heart sounds: Normal heart sounds. No murmur heard.   Pulmonary:     Effort: Pulmonary effort is normal. No respiratory distress.     Breath sounds: Normal breath sounds. No wheezing.  Chest:  Breasts:     Right: No swelling, bleeding, inverted nipple, mass, nipple discharge, skin change or tenderness.     Left: Mass present. No swelling, bleeding, inverted nipple or nipple discharge.        Comments: Hard nodule on left breast at 7' o'clock Abdominal:     General: Bowel sounds are normal. There is no distension.      Palpations: Abdomen is soft.     Tenderness: There is no abdominal tenderness.  Musculoskeletal:        General: No tenderness. Normal range of motion.     Cervical back: Normal range of motion and neck supple.     Right lower leg: Edema (3+) present.     Left lower leg: Edema (3+) present.  Skin:    General: Skin is warm and dry.  Neurological:     Mental Status: She is alert and oriented to person, place, and time.     Cranial Nerves: No cranial nerve deficit.     Deep Tendon Reflexes: Reflexes are normal and symmetric.  Psychiatric:        Behavior: Behavior normal.        Thought Content: Thought content normal.        Judgment: Judgment normal.       BP 124/71   Pulse 66   Temp 97.7 F (36.5 C) (Temporal)   Ht 5' 11" (1.803 m)   Wt 274 lb 12.8 oz (124.6 kg)   BMI 38.33 kg/m      Assessment & Plan:  Tammie [name] comes in today with chief complaint of Mass (On left breast ) and Congestive Heart  Failure (Gaining 4 lbs )   Diagnosis and orders addressed:  1. Acute on chronic diastolic CHF (congestive heart failure) (HCC) Increase lasix to 40 mg from 20 mg.  After weight stabilizes can decrease back to 20 mg daily.  Low salt diet  - furosemide (LASIX) 20 MG tablet; Take 1 tablet (20 mg total) by mouth 2 (two) times daily.  Dispense: 60 tablet; Refill: 4 - BMP8+EGFR - Brain natriuretic peptide  2. 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 Primary adenocarcinoma of colon (HCC) - BMP8+EGFR  4. 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 (HCC) - BMP8+EGFR  5. History of right breast cancer - BMP8+EGFR  6. Breast nodule Mammogram ordered - MM Digital Diagnostic Unilat L; Future - US BREAST COMPLETE UNI LEFT INC AXILLA; Future - BMP8+EGFR   Labs pending Health Maintenance reviewed Diet and exercise encouraged  Follow up plan:  Keep chronic follow up and appt with Oncologists.   Christy Hawks, FNP  

## 2021-05-23 LAB — BMP8+EGFR
BUN/Creatinine Ratio: 20 (ref 12–28)
BUN: 31 mg/dL — ABNORMAL HIGH (ref 8–27)
CO2: 26 mmol/L (ref 20–29)
Calcium: 8.6 mg/dL — ABNORMAL LOW (ref 8.7–10.3)
Chloride: 106 mmol/L (ref 96–106)
Creatinine, Ser: 1.53 mg/dL — ABNORMAL HIGH (ref 0.57–1.00)
Glucose: 93 mg/dL (ref 65–99)
Potassium: 5.5 mmol/L — ABNORMAL HIGH (ref 3.5–5.2)
Sodium: 146 mmol/L — ABNORMAL HIGH (ref 134–144)
eGFR: 34 mL/min/{1.73_m2} — ABNORMAL LOW (ref >59)

## 2021-05-23 LAB — BRAIN NATRIURETIC PEPTIDE: BNP: 202.2 pg/mL — ABNORMAL HIGH (ref 0.0–100.0)

## 2021-06-02 ENCOUNTER — Other Ambulatory Visit: Payer: Self-pay

## 2021-06-02 ENCOUNTER — Ambulatory Visit (INDEPENDENT_AMBULATORY_CARE_PROVIDER_SITE_OTHER): Payer: HMO | Admitting: Family

## 2021-06-02 ENCOUNTER — Encounter: Payer: Self-pay | Admitting: Internal Medicine

## 2021-06-02 ENCOUNTER — Encounter: Payer: Self-pay | Admitting: Family

## 2021-06-02 VITALS — BP 142/69 | HR 94 | Temp 97.8°F | Ht 71.0 in | Wt 272.6 lb

## 2021-06-02 DIAGNOSIS — I5033 Acute on chronic diastolic (congestive) heart failure: Secondary | ICD-10-CM | POA: Diagnosis not present

## 2021-06-02 NOTE — Patient Instructions (Signed)
Heart Failure, Diagnosis  Heart failure is a condition in which the heart has trouble pumping blood. This may mean that the heart cannot pump enough blood out to the body or that the heart does not fill up with enough blood. For some people with heart failure, fluid may back up into the lungs. There may also be swelling (edema) in the lower legs. Heart failure is usually a long-term (chronic) condition. It is important for you to take good care of yourself and follow the treatment plan from your health care provider. What are the causes? This condition may be caused by:  High blood pressure (hypertension). Hypertension causes the heart muscle to work harder than normal.  Coronary artery disease, or CAD. CAD is the buildup of cholesterol and fat (plaque) in the arteries of the heart.  Heart attack, also called myocardial infarction. This injures the heart muscle, making it hard for the heart to pump blood.  Abnormal heart valves. The valves do not open and close properly, forcing the heart to pump harder to keep the blood flowing.  Heart muscle disease, inflammation, or infection (cardiomyopathy or myocarditis). This is damage to the heart muscle. It can increase the risk of heart failure.  Lung disease. The heart works harder when the lungs are not healthy. What increases the risk? The risk of heart failure increases as a person ages. This condition is also more likely to develop in people who:  Are obese.  Are female.  Use tobacco or nicotine products.  Abuse alcohol or drugs.  Have taken medicines that can damage the heart, such as chemotherapy drugs.  Have any of these conditions: ? Diabetes. ? Abnormal heart rhythms. ? Thyroid problems. ? Low blood counts (anemia). ? Chronic kidney disease.  Have a family history of heart failure. What are the signs or symptoms? Symptoms of this condition include:  Shortness of breath with activity, such as when climbing stairs.  A cough  that does not go away.  Swelling of the feet, ankles, legs, or abdomen.  Losing or gaining weight for no reason.  Trouble breathing when lying flat.  Waking from sleep because of the need to sit up and get more air.  Rapid heartbeat.  Tiredness (fatigue) and loss of energy.  Feeling light-headed, dizzy, or close to fainting.  Nausea or loss of appetite.  Waking up more often during the night to urinate (nocturia).  Confusion. How is this diagnosed? This condition is diagnosed based on:  Your medical history, symptoms, and a physical exam.  Diagnostic tests, which may include: ? Echocardiogram. ? Electrocardiogram (ECG). ? Chest X-ray. ? Blood tests. ? Exercise stress test. ? Cardiac MRI. ? Cardiac catheterization and angiogram. ? Radionuclide scans. How is this treated? Treatment for this condition is aimed at managing the symptoms of heart failure. Medicines Treatment may include medicines that:  Help lower blood pressure by relaxing (dilating) the blood vessels. These medicines are called ACE inhibitors (angiotensin-converting enzyme), ARBs (angiotensin receptor blockers), or vasodilators.  Cause the kidneys to remove salt and water from the blood through urination (diuretics).  Improve heart muscle strength and prevent the heart from beating too fast (beta blockers).  Increase the force of the heartbeat (digoxin).  Lower heart rates. Certain diabetes medicines (SGLT-2 inhibitors) may also be used in treatment. Healthy behavior changes Treatment may also include making healthy lifestyle changes, such as:  Reaching and staying at a healthy weight.  Not using tobacco or nicotine products.  Eating heart-healthy foods.  Limiting or avoiding alcohol.  Stopping the use of illegal drugs.  Being physically active.  Participating in a cardiac rehabilitation program, which is a treatment program to improve your health and well-being through exercise training,  education, and counseling. Other treatments Other treatments may include:  Procedures to open blocked arteries or repair damaged valves.  Placing a pacemaker to improve heart function (cardiac resynchronization therapy).  Placing a device to treat serious abnormal heart rhythms (implantable cardioverter defibrillator, or ICD).  Placing a device to improve the pumping ability of the heart (left ventricular assist device, or LVAD).  Receiving a healthy heart from a donor (heart transplant). This is done when other treatments have not helped. Follow these instructions at home:  Manage other health conditions as told by your health care provider. These may include hypertension, diabetes, thyroid disease, or abnormal heart rhythms.  Get ongoing education and support as needed. Learn as much as you can about heart failure.  Keep all follow-up visits. This is important. Summary  Heart failure is a condition in which the heart has trouble pumping blood.  This condition is commonly caused by high blood pressure and other diseases of the heart and lungs.  Symptoms of this condition include shortness of breath, tiredness (fatigue), nausea, and swelling of the feet, ankles, legs, or abdomen.  Treatments for this condition may include medicines, lifestyle changes, and surgery.  Manage other health conditions as told by your health care provider. This information is not intended to replace advice given to you by your health care provider. Make sure you discuss any questions you have with your health care provider. Document Revised: 07/05/2020 Document Reviewed: 07/05/2020 Elsevier Patient Education  [place].

## 2021-06-02 NOTE — Progress Notes (Signed)
   Subjective:    Patient ID: Tammie [name], female    DOB: 08-10-1941, 80 y.o.   MRN: [id number]  Chief Complaint  Patient presents with  . Congestive Heart Failure   Pt presents to the office today CHF. She was seen on 05/22/21 and we increased lasix to 40 mg from 20 mg for 4 days.  She has lost 3 lbs since that visit.   She reports her breathing is stable.  Congestive Heart Failure Presents for follow-up visit. Associated symptoms include edema, fatigue, orthopnea and shortness of breath. Pertinent negatives include no nocturia. The symptoms have been improving.      Review of Systems  Constitutional: Positive for fatigue.  Respiratory: Positive for shortness of breath.   Cardiovascular: Positive for leg swelling.  Genitourinary: Negative for nocturia.  All other systems reviewed and are negative.      Objective:   Physical Exam Vitals reviewed.  Constitutional:      General: She is not in acute distress.    Appearance: She is well-developed. She is obese.  HENT:     Head: Normocephalic and atraumatic.     Right Ear: Tympanic membrane normal.     Left Ear: Tympanic membrane normal.  Eyes:     Pupils: Pupils are equal, round, and reactive to light.  Neck:     Thyroid: No thyromegaly.  Cardiovascular:     Rate and Rhythm: Normal rate and regular rhythm.     Heart sounds: Normal heart sounds. No murmur heard.   Pulmonary:     Effort: Pulmonary effort is normal. No respiratory distress.     Breath sounds: Normal breath sounds. No wheezing.  Abdominal:     General: Bowel sounds are normal. There is no distension.     Palpations: Abdomen is soft.     Tenderness: There is no abdominal tenderness.  Musculoskeletal:        General: No tenderness. Normal range of motion.     Cervical back: Normal range of motion and neck supple.     Right lower leg: Edema present.     Left lower leg: Edema present.  Skin:    General: Skin is warm and dry.  Neurological:     Mental  Status: She is alert and oriented to person, place, and time.     Cranial Nerves: No cranial nerve deficit.     Deep Tendon Reflexes: Reflexes are normal and symmetric.  Psychiatric:        Behavior: Behavior normal.        Thought Content: Thought content normal.        Judgment: Judgment normal.       BP (!) 142/69   Pulse 94   Temp 97.8 F (36.6 C) (Temporal)   Ht $R'5\' 11"'vj$  (1.803 m)   Wt 272 lb 9.6 oz (123.7 kg)   BMI 38.02 kg/m      Assessment & Plan:  Tammie [name] comes in today with chief complaint of Congestive Heart Failure   Diagnosis and orders addressed:  1. Acute on chronic diastolic CHF (congestive heart failure) (HCC) Continue lasix  Low salt  Compression hose RTO in 1 month  - BMP8+EGFR     [name], FNP

## 2021-06-03 LAB — BMP8+EGFR
BUN/Creatinine Ratio: 22 (ref 12–28)
BUN: 30 mg/dL — ABNORMAL HIGH (ref 8–27)
CO2: 25 mmol/L (ref 20–29)
Calcium: 9.6 mg/dL (ref 8.7–10.3)
Chloride: 102 mmol/L (ref 96–106)
Creatinine, Ser: 1.39 mg/dL — ABNORMAL HIGH (ref 0.57–1.00)
Glucose: 96 mg/dL (ref 65–99)
Potassium: 5.7 mmol/L — ABNORMAL HIGH (ref 3.5–5.2)
Sodium: 141 mmol/L (ref 134–144)
eGFR: 39 mL/min/{1.73_m2} — ABNORMAL LOW (ref >59)

## 2021-06-04 ENCOUNTER — Other Ambulatory Visit: Payer: Self-pay | Admitting: Family Medicine

## 2021-06-04 DIAGNOSIS — I5033 Acute on chronic diastolic (congestive) heart failure: Secondary | ICD-10-CM

## 2021-06-05 ENCOUNTER — Other Ambulatory Visit: Payer: HMO

## 2021-06-05 ENCOUNTER — Other Ambulatory Visit: Payer: Self-pay

## 2021-06-06 LAB — BMP8+EGFR
BUN/Creatinine Ratio: 17 (ref 12–28)
BUN: 29 mg/dL — ABNORMAL HIGH (ref 8–27)
CO2: 25 mmol/L (ref 20–29)
Calcium: 9.3 mg/dL (ref 8.7–10.3)
Chloride: 104 mmol/L (ref 96–106)
Creatinine, Ser: 1.68 mg/dL — ABNORMAL HIGH (ref 0.57–1.00)
Glucose: 98 mg/dL (ref 65–99)
Potassium: 5.5 mmol/L — ABNORMAL HIGH (ref 3.5–5.2)
Sodium: 145 mmol/L — ABNORMAL HIGH (ref 134–144)
eGFR: 31 mL/min/{1.73_m2} — ABNORMAL LOW (ref >59)

## 2021-06-09 NOTE — Progress Notes (Signed)
Attempted to contact patient - line busy 

## 2021-06-10 ENCOUNTER — Other Ambulatory Visit: Payer: Self-pay | Admitting: Family

## 2021-06-10 DIAGNOSIS — I10 Essential (primary) hypertension: Secondary | ICD-10-CM

## 2021-06-10 DIAGNOSIS — E785 Hyperlipidemia, unspecified: Secondary | ICD-10-CM

## 2021-06-17 ENCOUNTER — Telehealth: Payer: Self-pay | Admitting: Family

## 2021-06-17 NOTE — Telephone Encounter (Signed)
Pt aware of results per [facility] result notes and voiced understanding.

## 2021-06-30 ENCOUNTER — Ambulatory Visit (HOSPITAL_COMMUNITY)
Admission: RE | Admit: 2021-06-30 | Discharge: 2021-06-30 | Disposition: A | Discharge status: Home / Self Care | Payer: HMO | Source: Ambulatory Visit | Attending: Family | Admitting: Family

## 2021-06-30 ENCOUNTER — Telehealth: Payer: Self-pay

## 2021-06-30 DIAGNOSIS — N63 Unspecified lump in unspecified breast: Secondary | ICD-10-CM

## 2021-06-30 DIAGNOSIS — I4891 Unspecified atrial fibrillation: Secondary | ICD-10-CM

## 2021-06-30 DIAGNOSIS — Z853 Personal history of malignant neoplasm of breast: Secondary | ICD-10-CM | POA: Insufficient documentation

## 2021-06-30 NOTE — Telephone Encounter (Signed)
Description   INR 3.1 (too thin) Goal-2-3  Hold today's dose (06/30/21),  then  2.5 mg daily except Wednesday take 1.25 mg (1/2 tab)  Recheck in 2  week

## 2021-06-30 NOTE — Telephone Encounter (Signed)
Pt aware of what dosage to take and when to take it, wanted me to let you know she did have mammo today and she said they told her it looked like cancer

## 2021-06-30 NOTE — Telephone Encounter (Signed)
INR/Prothrombin Time  3.1 sy 8:51am

## 2021-07-01 ENCOUNTER — Other Ambulatory Visit (HOSPITAL_COMMUNITY): Payer: Self-pay | Admitting: Family

## 2021-07-01 ENCOUNTER — Telehealth: Payer: Self-pay | Admitting: Family

## 2021-07-01 DIAGNOSIS — R928 Other abnormal and inconclusive findings on diagnostic imaging of breast: Secondary | ICD-10-CM

## 2021-07-02 NOTE — Telephone Encounter (Signed)
Spoke with Stanton Kidney she can stop and go back 5 days after procedure. Patient has appt Monday and will advise.

## 2021-07-02 NOTE — Telephone Encounter (Signed)
Ok to hold for procedure.

## 2021-07-06 ENCOUNTER — Other Ambulatory Visit: Payer: Self-pay

## 2021-07-06 ENCOUNTER — Encounter: Payer: Self-pay | Admitting: Family

## 2021-07-06 ENCOUNTER — Ambulatory Visit (INDEPENDENT_AMBULATORY_CARE_PROVIDER_SITE_OTHER): Payer: HMO | Admitting: Family

## 2021-07-06 VITALS — BP 140/81 | HR 64 | Temp 97.9°F | Ht 71.0 in | Wt 268.6 lb

## 2021-07-06 DIAGNOSIS — Z853 Personal history of malignant neoplasm of breast: Secondary | ICD-10-CM | POA: Diagnosis not present

## 2021-07-06 DIAGNOSIS — I4891 Unspecified atrial fibrillation: Secondary | ICD-10-CM | POA: Diagnosis not present

## 2021-07-06 DIAGNOSIS — N6320 Unspecified lump in the left breast, unspecified quadrant: Secondary | ICD-10-CM | POA: Diagnosis not present

## 2021-07-06 DIAGNOSIS — Z7901 Long term (current) use of anticoagulants: Secondary | ICD-10-CM | POA: Diagnosis not present

## 2021-07-06 DIAGNOSIS — E875 Hyperkalemia: Secondary | ICD-10-CM

## 2021-07-06 DIAGNOSIS — I5033 Acute on chronic diastolic (congestive) heart failure: Secondary | ICD-10-CM

## 2021-07-06 LAB — COAGUCHEK XS/INR WAIVED
INR: 2.2 — ABNORMAL HIGH (ref 0.9–1.1)
Prothrombin Time: 26.7 s

## 2021-07-06 NOTE — Progress Notes (Signed)
Subjective:    Patient ID: Tammie [name], female    DOB: 03-13-1941, 80 y.o.   MRN: [id number]  . Chief Complaint  Patient presents with   Anticoagulation    Also discuss going off warfrin for procedure    Pt presents to the office today for INR check. PT currently taking warfarin for A Fib. Pt currently taking 2.5 mg every day except on Wednesday taking 1.25 mg.   Her INR today is 2.2. See anticoagulation flow sheet.    Denies any bleeding, bruising, medication changes, or diet changes.   She is scheduled for a breast biopsy on 07/14/21. She wants to know if she can stop prior.   Her potassium was elevated on last lab work. She has been taking oral potassium.  Congestive Heart Failure Presents for follow-up visit. Associated symptoms include edema, fatigue and shortness of breath. The symptoms have been stable.     Review of Systems  Constitutional:  Positive for fatigue.  Respiratory:  Positive for shortness of breath.   All other systems reviewed and are negative.     Objective:   Physical Exam Vitals reviewed.  Constitutional:      General: She is not in acute distress.    Appearance: She is well-developed. She is obese.  HENT:     Head: Normocephalic and atraumatic.  Eyes:     Pupils: Pupils are equal, round, and reactive to light.  Neck:     Thyroid: No thyromegaly.  Cardiovascular:     Rate and Rhythm: Normal rate and regular rhythm.     Heart sounds: Normal heart sounds. No murmur heard. Pulmonary:     Effort: Pulmonary effort is normal. No respiratory distress.     Breath sounds: Normal breath sounds. No wheezing.  Abdominal:     General: Bowel sounds are normal. There is no distension.     Palpations: Abdomen is soft.     Tenderness: There is no abdominal tenderness.  Musculoskeletal:        General: No tenderness. Normal range of motion.     Cervical back: Normal range of motion and neck supple.     Right lower leg: Edema (3+) present.     Left  lower leg: Edema (3+) present.  Skin:    General: Skin is warm and dry.  Neurological:     Mental Status: She is alert and oriented to person, place, and time.     Cranial Nerves: No cranial nerve deficit.     Deep Tendon Reflexes: Reflexes are normal and symmetric.  Psychiatric:        Behavior: Behavior normal.        Thought Content: Thought content normal.        Judgment: Judgment normal.     BP 140/81   Pulse 64   Temp 97.9 F (36.6 C) (Temporal)   Ht _0  (1.803 m)   Wt 268 lb 9.6 oz (121.8 kg)   SpO2 94%   BMI 37.46 kg/m       Assessment & Plan:  Tammie [name] comes in today with chief complaint of Anticoagulation (Also discuss going off warfrin for procedure )   Diagnosis and orders addressed:  1. Atrial fibrillation, unspecified type [facility]) Description   INR 2.2 (too thin) Goal-2-3  Continue  2.5 mg daily except Wednesday take 1.25 mg (1/2 tab)  Recheck in 4 weeks.       - CoaguChek XS/INR Waived - BMP8+EGFR  2. Left breast  mass - BMP8+EGFR  3. History of right breast cancer - BMP8+EGFR  4. Warfarin anticoagulation - BMP8+EGFR  5. Acute on chronic diastolic CHF (congestive heart failure) (HCC) - BMP8+EGFR  6. Hyperkalemia Stop oral K+ - BMP8+EGFR   Labs pending Health Maintenance reviewed Diet and exercise encouraged  Follow up plan: 4 weeks   [name], FNP

## 2021-07-06 NOTE — Patient Instructions (Signed)
Hyperkalemia Hyperkalemia occurs when the level of potassium in your blood is too high. Potassium is an important nutrient that helps the muscles and nerves function normally. It affects how the heart works, and it helps keep fluids and minerals balanced in the body. If there is too much potassium in your blood, it canaffect your heart's ability to function normally. Potassium is normally removed (excreted) from the body by the kidneys. Hyperkalemia can result from variousconditions. It can range from mild to severe. What are the causes? This condition may be caused by: Taking in too much potassium. You can do this by: Using salt substitutes. They contain large amounts of potassium. Taking potassium supplements. Eating foods that are high in potassium. Excreting too little potassium. This can happen if: Your kidneys are not working properly. Kidney (renal) disease, including short-term or long-term renal failure, is a common cause of hyperkalemia. You are taking medicines that lower your excretion of potassium. You have Addison's disease. You have a urinary tract blockage, such as kidney stones. You are on treatment to mechanically clean your blood (dialysis) and you skip a treatment. Releasing a high amount of potassium from your cells into your blood. This can happen with: Injury to muscles (rhabdomyolysis) or other tissues. Most potassium is stored in your muscles. Severe burns or infections. Acidic blood plasma (acidosis). Acidosis can result from many diseases, such as uncontrolled diabetes. What increases the risk? The following factors may make you more likely to develop this condition: Kidney disease. This puts you at the highest risk. Addison's disease. This is a condition where the adrenal glands do not produce enough hormones. Alcoholism or heavy drug use. Using certain blood pressure medicines, such as ACE inhibitors, angiotensin II receptor blockers (ARBs), or potassium-sparing  diuretics such as spironolactone. Severe injury or burn. What are the signs or symptoms? In many cases, there are no symptoms. However, when your potassium level becomes high enough, you may have symptoms such as: An irregular or very slow heartbeat. Nausea. Tiredness (fatigue). Confusion. Tingling of your skin or numbness of your hands or feet. Muscle cramps. Muscle weakness. Not being able to move (paralysis). How is this diagnosed? This condition may be diagnosed based on: Your symptoms and medical history. Your health care provider will ask about your use of prescription and non-prescription drugs. A physical exam. Blood tests. An electrocardiogram (ECG). How is this treated? Treatment depends on the cause and severity of your condition. Treatment may need to be done in the hospital setting. Treatment may include: IV glucose (sugar) along with insulin to shift potassium out of your blood and into your cells. A medicine called albuterol to shift potassium out of your blood and into your cells. Medicines to remove the potassium from your body. Dialysis to remove the potassium from your body. Calcium to protect your heart from the effects of high potassium, such as irregular rhythms (arrhythmias). Follow these instructions at home:  Take over-the-counter and prescription medicines only as told by your health care provider. Do not take any supplements, natural products, herbs, or vitamins without reviewing them with your health care provider. Certain supplements and natural food products contain high amounts of potassium. Limit your alcohol intake as told by your health care provider. Do not use drugs. If you need help quitting, ask your health care provider. If you have kidney disease, you may need to follow a low-potassium diet. A dietitian can help you learn which foods have high or low amounts of potassium. Keep all follow-up  visits as told by your health care provider. This is  important. Contact a health care provider if you: Have an irregular or very slow heartbeat. Feel light-headed. Feel weak. Are nauseous. Have tingling or numbness in your hands or feet. Get help right away if you: Have shortness of breath. Have chest pain or discomfort. Pass out. Have muscle paralysis. Summary Hyperkalemia occurs when the level of potassium in your blood is too high. This condition may be caused by taking in too much potassium, excreting too little potassium, or releasing a high amount of potassium from your cells into your blood. Hyperkalemia can result from many underlying conditions, especially chronic kidney disease, or from taking certain medicines. Treatment of hyperkalemia may include medicine to shift potassium out of your blood and into your cells or to remove the potassium from your body. If you have kidney disease, you may need to follow a low-potassium diet. A dietitian can help you learn which foods have high or low amounts of potassium. This information is not intended to replace advice given to you by your health care provider. Make sure you discuss any questions you have with your healthcare provider. Document Revised: 05/15/2020 Document Reviewed: 05/15/2020 Elsevier Patient Education  [place].

## 2021-07-07 ENCOUNTER — Other Ambulatory Visit: Payer: Self-pay | Admitting: Family

## 2021-07-07 LAB — BMP8+EGFR
BUN/Creatinine Ratio: 21 (ref 12–28)
BUN: 34 mg/dL — ABNORMAL HIGH (ref 8–27)
CO2: 26 mmol/L (ref 20–29)
Calcium: 9.3 mg/dL (ref 8.7–10.3)
Chloride: 103 mmol/L (ref 96–106)
Creatinine, Ser: 1.6 mg/dL — ABNORMAL HIGH (ref 0.57–1.00)
Glucose: 108 mg/dL — ABNORMAL HIGH (ref 65–99)
Potassium: 5.5 mmol/L — ABNORMAL HIGH (ref 3.5–5.2)
Sodium: 143 mmol/L (ref 134–144)
eGFR: 33 mL/min/{1.73_m2} — ABNORMAL LOW (ref >59)

## 2021-07-14 ENCOUNTER — Encounter (HOSPITAL_COMMUNITY): Payer: Self-pay

## 2021-07-14 ENCOUNTER — Other Ambulatory Visit (HOSPITAL_COMMUNITY): Payer: Self-pay | Admitting: Family

## 2021-07-14 ENCOUNTER — Ambulatory Visit (HOSPITAL_COMMUNITY)
Admission: RE | Admit: 2021-07-14 | Discharge: 2021-07-14 | Disposition: A | Discharge status: Home / Self Care | Payer: HMO | Source: Ambulatory Visit | Attending: Family | Admitting: Family

## 2021-07-14 ENCOUNTER — Other Ambulatory Visit: Payer: Self-pay

## 2021-07-14 DIAGNOSIS — R928 Other abnormal and inconclusive findings on diagnostic imaging of breast: Secondary | ICD-10-CM

## 2021-07-14 DIAGNOSIS — C50912 Malignant neoplasm of unspecified site of left female breast: Secondary | ICD-10-CM | POA: Insufficient documentation

## 2021-07-14 MED ORDER — LIDOCAINE HCL 2 % IJ SOLN
INTRAMUSCULAR | Status: AC
  Administered 2021-07-14: 200 mg
  Filled 2021-07-14: qty 10

## 2021-07-14 MED ORDER — LIDOCAINE-EPINEPHRINE (PF) 2 %-1:200000 IJ SOLN
INTRAMUSCULAR | Status: AC
  Administered 2021-07-14: 10 mL
  Filled 2021-07-14: qty 10

## 2021-07-14 MED ORDER — SODIUM BICARBONATE 4.2 % IV SOLN
INTRAVENOUS | Status: AC
  Administered 2021-07-14: 2.5 meq
  Filled 2021-07-14: qty 10

## 2021-07-14 NOTE — Sedation Documentation (Signed)
PT tolerated left breast biopsy and clip placement well today with NAD noted. PT verbalized understanding of discharge instructions. PT ambulated back to the mammogram area this time and was given discharge instructions and ice pack.

## 2021-07-15 ENCOUNTER — Other Ambulatory Visit: Payer: Self-pay | Admitting: Family

## 2021-07-15 DIAGNOSIS — I4891 Unspecified atrial fibrillation: Secondary | ICD-10-CM

## 2021-07-15 DIAGNOSIS — E785 Hyperlipidemia, unspecified: Secondary | ICD-10-CM

## 2021-07-18 IMAGING — CT CT ABD-PELV W/O CM
2 of 4 series · 13 of 36 positions shown, 16 images · non-contrast
Comparison: CT 09/28/2019

CLINICAL DATA: Non-small cell lung cancer. Additional history of
breast cancer and colon cancer.

EXAM:
CT CHEST, ABDOMEN AND PELVIS WITHOUT CONTRAST
TECHNIQUE: Multidetector CT imaging of the chest, abdomen and pelvis was
performed following the standard protocol without IV contrast.

[Series 2: cap w/o · axial · non-contrast · 0.98mm/px · z∈[-708,-162]mm · 10 of 129 slices shown, 13 images]
[im 10/129  mediastinal]
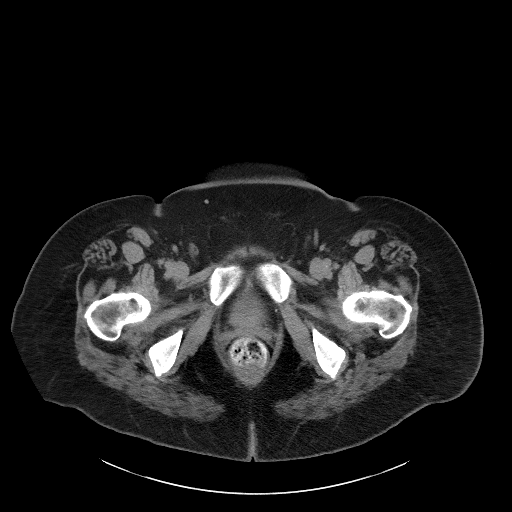
[im 10/129  lung]
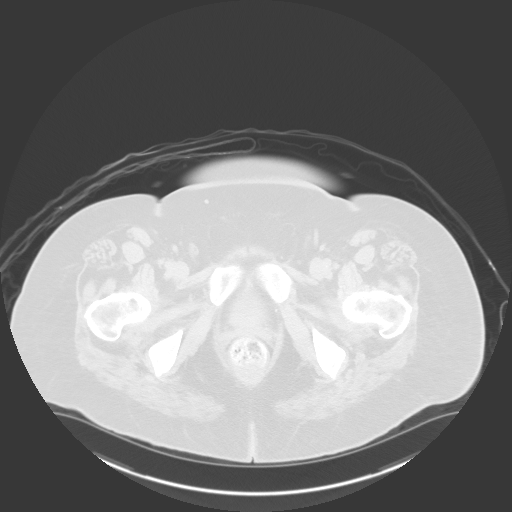
[im 20/129  lung]
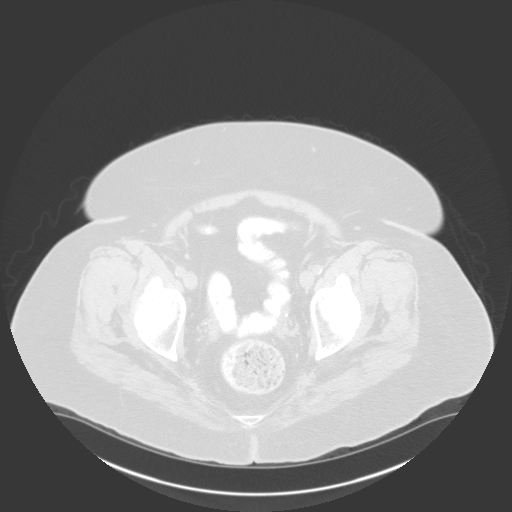
[im 40/129  lung]
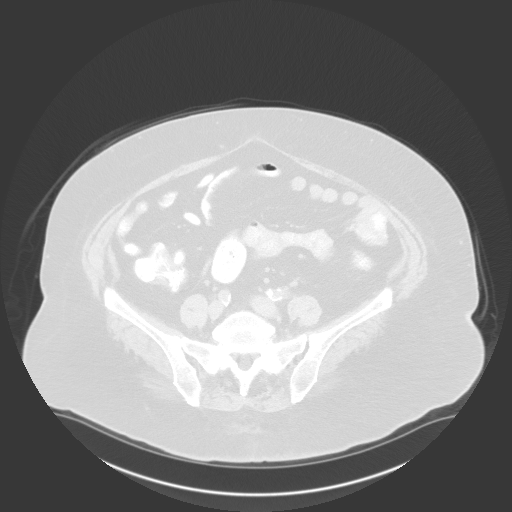
[im 50/129  lung]
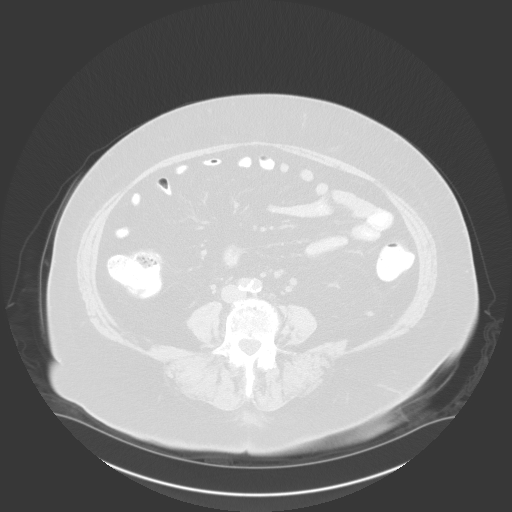
[im 60/129  mediastinal]
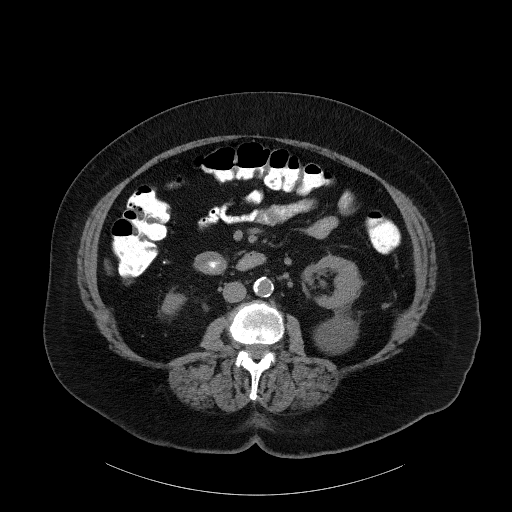
[im 60/129  lung]
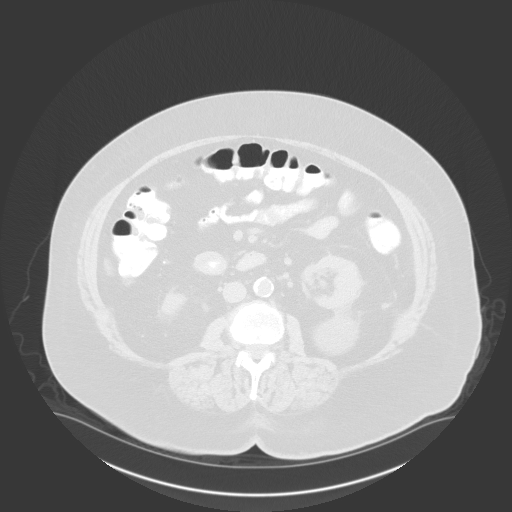
[im 69/129  lung]
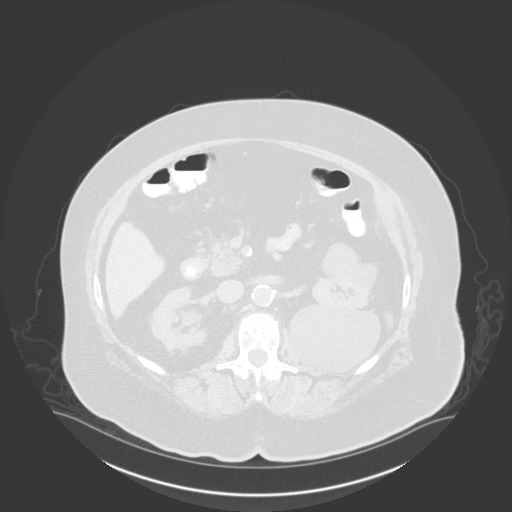
[im 79/129  lung]
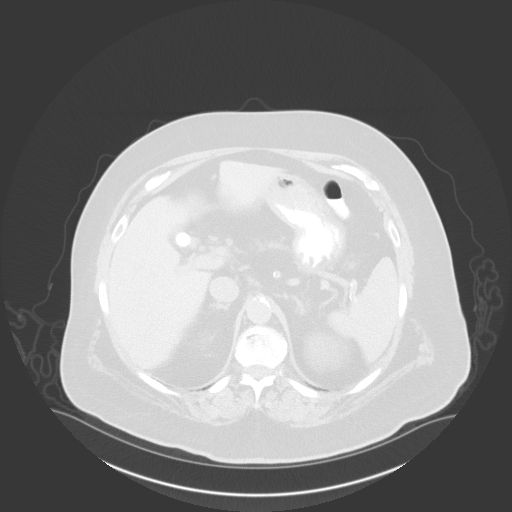
[im 99/129  lung]
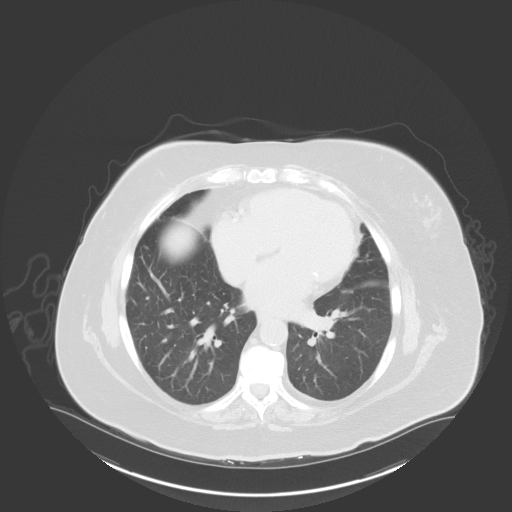
[im 109/129  mediastinal]
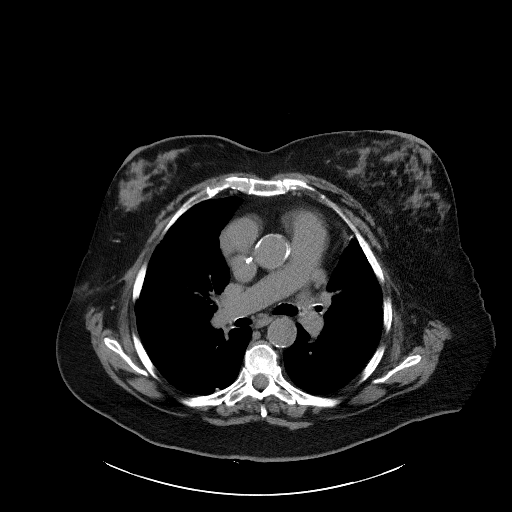
[im 109/129  lung]
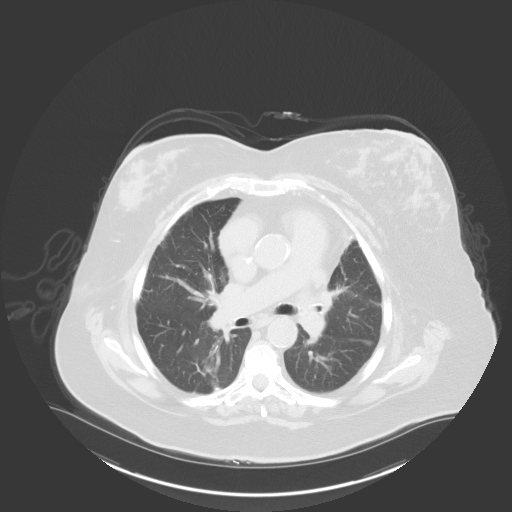
[im 119/129  lung]
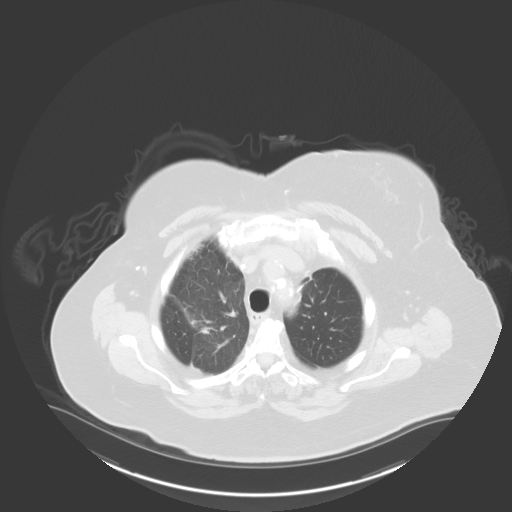

[Series 5: coronals · coronal · 0.82mm/px · 3 of 160 slices shown]
[im 32/160  lung]
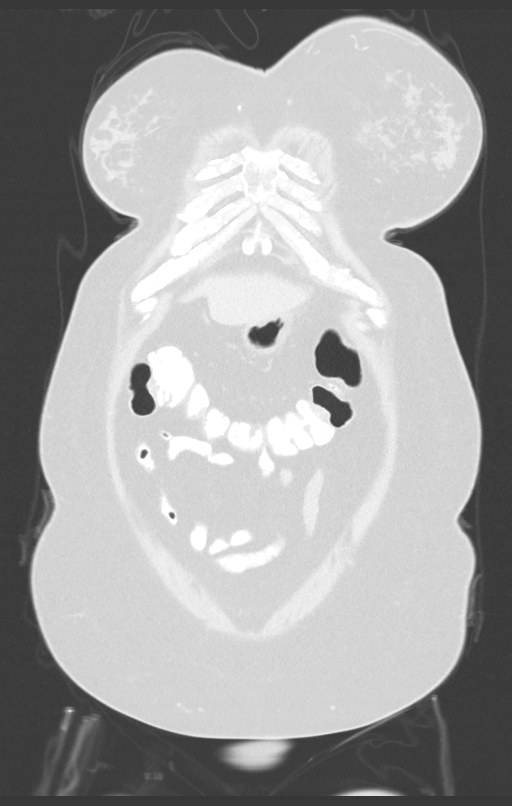
[im 64/160  lung]
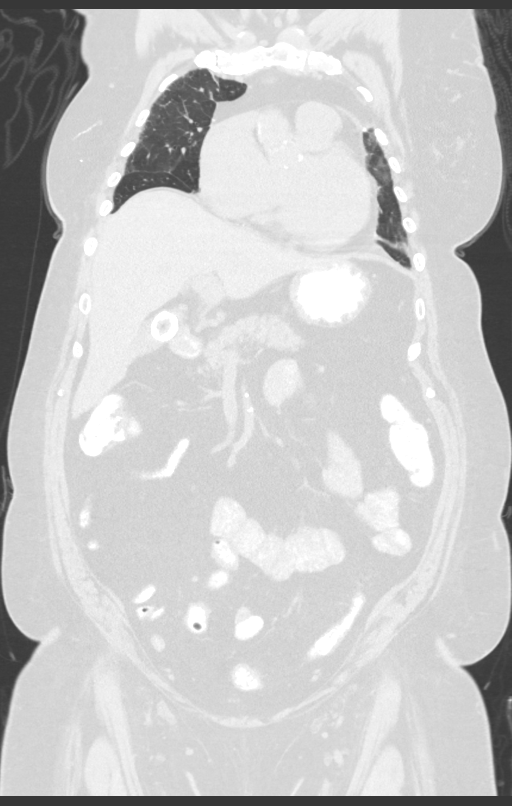
[im 96/160  lung]
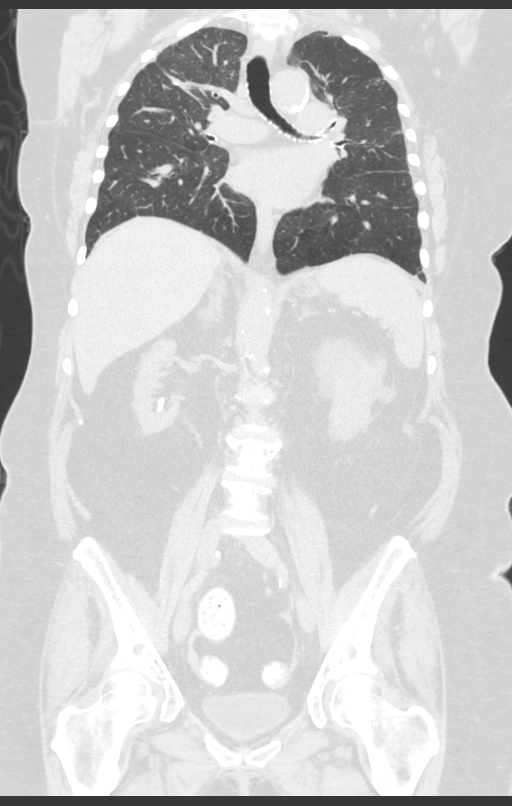

[13 of 36 positions shown; findings below may reference images not displayed]

FINDINGS: CT CHEST FINDINGS

Cardiovascular: Coronary artery calcification and aortic
atherosclerotic calcification. Port in the anterior chest wall with
tip in distal SVC.

Mediastinum/Nodes: No axillary supraclavicular adenopathy. No
mediastinal hilar adenopathy. No pericardial effusion. Esophagus
normal.

Lungs/Pleura: Angular pleuroparenchymal thickening at the RIGHT
upper at lobe resection site without new nodularity.

Peripheral nodule in the RIGHT lower lobe measuring 9 mm compares to
6 mm on CT 09/28/2019 and 8 mm on CT 06/16/2019. Nodule increased
from 6 mm on CT 11/03/2017. Visually nodule appears of increased in
density over this long interval.

Within the LEFT upper lobe, pleuroparenchymal thickening along the
resection margin is unchanged.

Musculoskeletal: No aggressive osseous lesion.

CT ABDOMEN AND PELVIS FINDINGS

Hepatobiliary: No focal hepatic lesion.  Large gallstone.

Pancreas: Pancreas is normal. No ductal dilatation. No pancreatic
inflammation.

Spleen: Normal volume

Adrenals/urinary tract: Adrenal glands normal. Nonobstructing
calculus in the RIGHT kidney. Large simple fluid cysts of the LEFT
kidney.

Stomach/Bowel: Stomach, small bowel, appendix, and cecum are
normal. The colon and rectosigmoid colon are normal.

Vascular/Lymphatic: Abdominal aorta is normal caliber with
atherosclerotic calcification. There is no retroperitoneal or
periportal lymphadenopathy. No pelvic lymphadenopathy.

Reproductive: Post hysterectomy.

Other: No free fluid.

Musculoskeletal: No aggressive osseous lesion.
IMPRESSION: Chest Impression:

1. RIGHT lower lobe pulmonary nodules present on multiple comparison
exams comparing back to 5107; however, the nodule has slowly
increased in size and density. Recommend follow-up CT or PET-CT in 3
months.
2. Postsurgical change in LEFT upper lobe and RIGHT upper lobe
without evidence local recurrence.
3. No mediastinal lymphadenopathy.
4. Coronary artery calcification and Aortic Atherosclerosis
([id number]).

Abdomen / Pelvis Impression:

1. No evidence of primary or secondary malignancy in the abdomen
pelvis.
2. Incidental findings of gallstone and RIGHT renal stone.
3. Benign LEFT renal cysts.
4.  Atherosclerotic calcification of the aorta.

## 2021-07-21 ENCOUNTER — Telehealth: Payer: Self-pay | Admitting: *Deleted

## 2021-07-21 ENCOUNTER — Telehealth: Payer: Self-pay

## 2021-07-21 ENCOUNTER — Telehealth: Payer: Self-pay | Admitting: Family

## 2021-07-21 DIAGNOSIS — I4891 Unspecified atrial fibrillation: Secondary | ICD-10-CM

## 2021-07-21 LAB — SURGICAL PATHOLOGY

## 2021-07-21 NOTE — Telephone Encounter (Signed)
Description   INR  1.4 (too thick) Goal-2-3  Pt has been hold warfarin for procedure, resume current dose of  2.5 mg daily except Wednesday take 1.25 mg (1/2 tab)  Recheck in 2weeks.

## 2021-07-21 NOTE — Telephone Encounter (Signed)
Fax received mdINR PT/INR self testing service Test date/time 07/21/21 1243 pm INR 1.4

## 2021-07-21 NOTE — Telephone Encounter (Signed)
Patient aware and verbalized understanding. Patient states you usually give shots for procedures

## 2021-07-21 NOTE — Telephone Encounter (Signed)
   [facility] Pre-operative Risk Assessment    Patient Name: Tammie [name]  DOB: 11/06/1941 MRN: [id number]  HEARTCARE STAFF:  - IMPORTANT!!!!!! Under Visit Info/Reason for Call, type in Other and utilize the format Clearance MM/DD/YY or Clearance TBD. Do not use dashes or single digits. - Please review there is not already an duplicate clearance open for this procedure. - If request is for dental extraction, please clarify the # of teeth to be extracted. - If the patient is currently at the dentist's office, call Pre-Op Callback Staff (MA/nurse) to input urgent request.  - If the patient is not currently in the dentist office, please route to the Pre-Op pool.  Request for surgical clearance:  What type of surgery is being performed? Lumpectomy Surgery   When is this surgery scheduled? TBD   What type of clearance is required (medical clearance vs. Pharmacy clearance to hold med vs. Both)? Clearance    Are there any medications that need to be held prior to surgery and how long? N/A   Practice name and name of physician performing surgery? [facility] - Dr.Paul Daiva Nakayama, MD   What is the office phone number? [phone]   7.   What is the office fax number? [place], CMA   8.   Anesthesia type (None, local, MAC, general) ? General    Ena Dawley 07/21/2021, 2:02 PM  _________________________________________________________________   (provider comments below)

## 2021-07-21 NOTE — Telephone Encounter (Signed)
   Primary Cardiologist: Minus Breeding, MD  Chart reviewed as part of pre-operative protocol coverage. Given past medical history and time since last visit, based on ACC/AHA guidelines, Tammie [name] would be at acceptable risk for the planned procedure without further cardiovascular testing.   Patient taking Coumadin.  Will need to receive recommendations on holding anticoagulant from prescribing provider.  I will route this recommendation to the requesting party via Epic fax function and remove from pre-op pool.  Please call with questions.  Jossie Ng. Curstin Schmale NP-C    07/21/2021, 2:41 PM [place] [address] Office [phone] Fax [phone]

## 2021-07-23 ENCOUNTER — Telehealth: Payer: Self-pay | Admitting: Hematology and Oncology

## 2021-07-23 NOTE — Telephone Encounter (Signed)
Received a new pt referral from Dr. Marlou Starks for a dx of breast cancer. Tammie [name] has been cld and scheduled to see Dr. Lindi Adie on 8/2 at 345pm.
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
## 2021-07-28 ENCOUNTER — Other Ambulatory Visit: Payer: Self-pay

## 2021-07-28 ENCOUNTER — Encounter: Payer: Self-pay | Admitting: *Deleted

## 2021-07-28 ENCOUNTER — Inpatient Hospital Stay: Payer: HMO | Attending: Hematology and Oncology | Admitting: Hematology and Oncology

## 2021-07-28 DIAGNOSIS — Z853 Personal history of malignant neoplasm of breast: Secondary | ICD-10-CM | POA: Diagnosis not present

## 2021-07-28 DIAGNOSIS — Z8673 Personal history of transient ischemic attack (TIA), and cerebral infarction without residual deficits: Secondary | ICD-10-CM | POA: Insufficient documentation

## 2021-07-28 DIAGNOSIS — Z87891 Personal history of nicotine dependence: Secondary | ICD-10-CM | POA: Diagnosis not present

## 2021-07-28 DIAGNOSIS — I10 Essential (primary) hypertension: Secondary | ICD-10-CM | POA: Diagnosis not present

## 2021-07-28 DIAGNOSIS — Z85118 Personal history of other malignant neoplasm of bronchus and lung: Secondary | ICD-10-CM | POA: Insufficient documentation

## 2021-07-28 DIAGNOSIS — Z170 Estrogen receptor positive status [ER+]: Secondary | ICD-10-CM | POA: Diagnosis not present

## 2021-07-28 DIAGNOSIS — Z85038 Personal history of other malignant neoplasm of large intestine: Secondary | ICD-10-CM | POA: Diagnosis not present

## 2021-07-28 DIAGNOSIS — Z923 Personal history of irradiation: Secondary | ICD-10-CM | POA: Diagnosis not present

## 2021-07-28 DIAGNOSIS — C50212 Malignant neoplasm of upper-inner quadrant of left female breast: Secondary | ICD-10-CM | POA: Diagnosis not present

## 2021-07-28 DIAGNOSIS — Z9221 Personal history of antineoplastic chemotherapy: Secondary | ICD-10-CM | POA: Diagnosis not present

## 2021-07-28 MED ORDER — LETROZOLE 2.5 MG PO TABS: 2.5000 mg | ORAL_TABLET | Freq: Every day | ORAL | 3 refills | Status: DC

## 2021-07-28 NOTE — Assessment & Plan Note (Addendum)
Palpable mass in inner left breast. Diagnostic mammogram and [place] on 06/30/21 showed 1.8 cm palpable mass at 9 o'clock position of the left breast. Biopsy on 07/14/21 showed invasive ductal carcinoma with calcifications; ER/PR+ (95%), HER2-, Ki67 (2%)  (History of NSCLC stage IA adenocarcinoma s/p wedge resection 2012 by Dr. Arlyce Dice.  ALK and EGFR negative. History of stage Ia right breast cancer November 2014 triple negative: Status post right lumpectomy, 1.2 cm, declined adjuvant chemo received radiation History of stage IIIa non-small cell lung cancer adenocarcinoma July 2015 resected by Dr. Roxan Hockey followed by adjuvant chemo with cisplatin and Alimta.  Keytruda until 12/21/2018 History of metastatic colon cancer with liver metastases October 2017)   Pathology and radiology counseling:Discussed with the patient, the details of pathology including the type of breast cancer,the clinical staging, the significance of ER, PR and HER-2/neu receptors and the implications for treatment. After reviewing the pathology in detail, we proceeded to discuss the different treatment options between surgery, radiation, and antiestrogen therapies.  Recommendations: 1. Breast conserving surgery followed by 2. Adjuvant radiation therapy followed by 3. Adjuvant antiestrogen therapy  Patient follows with [name] for lung, colon and prior history of breast cancers.  I will discuss with [name] to continue to see her and manage her antiestrogen treatment.

## 2021-07-29 ENCOUNTER — Telehealth: Payer: Self-pay | Admitting: *Deleted

## 2021-07-29 DIAGNOSIS — I4891 Unspecified atrial fibrillation: Secondary | ICD-10-CM

## 2021-07-29 NOTE — Telephone Encounter (Signed)
Fax received mdINR PT/INR self testing service Test date/time 07/28/21 1217 pm INR 1.9

## 2021-07-29 NOTE — Telephone Encounter (Signed)
Pt aware.

## 2021-07-29 NOTE — Telephone Encounter (Signed)
Indication: Afib Goal INR: 2-3 Current regimen: 2.5 mg daily except for Wednesdays she takes 1.25 mg.  Recommendations:  Have her increase to 2.5 mg daily (including today) with recheck in 1 week.

## 2021-07-29 NOTE — Telephone Encounter (Signed)
Patient is taking 2.5 mg daily except Wednesdays and she is taking 1/2 of that on that day.

## 2021-07-29 NOTE — Telephone Encounter (Signed)
Please verify with the patient her current dosing regimen so I can make a decision about where to increase the coumadin.

## 2021-08-04 ENCOUNTER — Ambulatory Visit: Payer: HMO | Admitting: Radiation Oncology

## 2021-08-04 ENCOUNTER — Other Ambulatory Visit: Payer: Self-pay

## 2021-08-04 ENCOUNTER — Other Ambulatory Visit: Payer: HMO

## 2021-08-04 ENCOUNTER — Inpatient Hospital Stay: Payer: HMO

## 2021-08-04 ENCOUNTER — Ambulatory Visit: Payer: HMO

## 2021-08-04 ENCOUNTER — Ambulatory Visit (HOSPITAL_COMMUNITY)
Admission: RE | Admit: 2021-08-04 | Discharge: 2021-08-04 | Disposition: A | Discharge status: Home / Self Care | Payer: HMO | Source: Ambulatory Visit | Attending: Internal Medicine | Admitting: Internal Medicine

## 2021-08-04 DIAGNOSIS — C50212 Malignant neoplasm of upper-inner quadrant of left female breast: Secondary | ICD-10-CM | POA: Diagnosis not present

## 2021-08-04 DIAGNOSIS — Z95828 Presence of other vascular implants and grafts: Secondary | ICD-10-CM

## 2021-08-04 DIAGNOSIS — C3490 Malignant neoplasm of unspecified part of unspecified bronchus or lung: Secondary | ICD-10-CM

## 2021-08-04 LAB — CMP (CANCER CENTER ONLY)
ALT: 10 U/L (ref 0–44)
AST: 16 U/L (ref 15–41)
Albumin: 3.5 g/dL (ref 3.5–5.0)
Alkaline Phosphatase: 51 U/L (ref 38–126)
Anion gap: 10 (ref 5–15)
BUN: 33 mg/dL — ABNORMAL HIGH (ref 8–23)
CO2: 28 mmol/L (ref 22–32)
Calcium: 9.5 mg/dL (ref 8.9–10.3)
Chloride: 107 mmol/L (ref 98–111)
Creatinine: 1.79 mg/dL — ABNORMAL HIGH (ref 0.44–1.00)
GFR, Estimated: 28 mL/min — ABNORMAL LOW (ref >60)
Glucose, Bld: 103 mg/dL — ABNORMAL HIGH (ref 70–99)
Potassium: 4.4 mmol/L (ref 3.5–5.1)
Sodium: 145 mmol/L (ref 135–145)
Total Bilirubin: 0.4 mg/dL (ref 0.3–1.2)
Total Protein: 7.1 g/dL (ref 6.5–8.1)

## 2021-08-04 LAB — CBC WITH DIFFERENTIAL (CANCER CENTER ONLY)
Abs Immature Granulocytes: 0.01 10*3/uL (ref 0.00–0.07)
Basophils Absolute: 0.1 10*3/uL (ref 0.0–0.1)
Basophils Relative: 1 %
Eosinophils Absolute: 0.3 10*3/uL (ref 0.0–0.5)
Eosinophils Relative: 4 %
HCT: 40.5 % (ref 36.0–46.0)
Hemoglobin: 12.9 g/dL (ref 12.0–15.0)
Immature Granulocytes: 0 %
Lymphocytes Relative: 15 %
Lymphs Abs: 0.9 10*3/uL (ref 0.7–4.0)
MCH: 28.2 pg (ref 26.0–34.0)
MCHC: 31.9 g/dL (ref 30.0–36.0)
MCV: 88.4 fL (ref 80.0–100.0)
Monocytes Absolute: 0.5 10*3/uL (ref 0.1–1.0)
Monocytes Relative: 9 %
Neutro Abs: 4.2 10*3/uL (ref 1.7–7.7)
Neutrophils Relative %: 71 %
Platelet Count: 183 10*3/uL (ref 150–400)
RBC: 4.58 MIL/uL (ref 3.87–5.11)
RDW: 13.6 % (ref 11.5–15.5)
WBC Count: 6 10*3/uL (ref 4.0–10.5)
nRBC: 0 % (ref 0.0–0.2)

## 2021-08-04 LAB — CEA (IN HOUSE-CHCC): CEA (CHCC-In House): 3.25 ng/mL (ref 0.00–5.00)

## 2021-08-04 MED ORDER — HEPARIN SOD (PORK) LOCK FLUSH 100 UNIT/ML IV SOLN
500.0000 [IU] | Freq: Once | INTRAVENOUS | Status: AC | PRN
Start: 2021-08-04 — End: 2021-08-04
  Administered 2021-08-04: 500 [IU]
  Filled 2021-08-04: qty 5

## 2021-08-04 MED ORDER — SODIUM CHLORIDE 0.9% FLUSH
10.0000 mL | INTRAVENOUS | Status: DC | PRN
  Administered 2021-08-04: 10 mL
  Filled 2021-08-04: qty 10

## 2021-08-04 NOTE — Progress Notes (Signed)
Pt did not want to stay accessed for CT today. (08/04/2021)

## 2021-08-05 ENCOUNTER — Other Ambulatory Visit: Payer: Self-pay | Admitting: Family

## 2021-08-05 DIAGNOSIS — I10 Essential (primary) hypertension: Secondary | ICD-10-CM

## 2021-08-06 ENCOUNTER — Other Ambulatory Visit: Payer: Self-pay

## 2021-08-06 ENCOUNTER — Inpatient Hospital Stay: Payer: HMO | Admitting: Internal Medicine

## 2021-08-06 VITALS — BP 143/82 | HR 64 | Temp 97.9°F | Resp 20 | Ht 71.0 in | Wt 271.5 lb

## 2021-08-06 DIAGNOSIS — C229 Malignant neoplasm of liver, not specified as primary or secondary: Secondary | ICD-10-CM

## 2021-08-06 DIAGNOSIS — Z5181 Encounter for therapeutic drug level monitoring: Secondary | ICD-10-CM

## 2021-08-06 DIAGNOSIS — C50212 Malignant neoplasm of upper-inner quadrant of left female breast: Secondary | ICD-10-CM | POA: Diagnosis not present

## 2021-08-06 DIAGNOSIS — Z170 Estrogen receptor positive status [ER+]: Secondary | ICD-10-CM

## 2021-08-06 DIAGNOSIS — C189 Malignant neoplasm of colon, unspecified: Secondary | ICD-10-CM | POA: Diagnosis not present

## 2021-08-06 DIAGNOSIS — C3431 Malignant neoplasm of lower lobe, right bronchus or lung: Secondary | ICD-10-CM | POA: Diagnosis not present
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
## 2021-08-07 ENCOUNTER — Telehealth: Payer: Self-pay | Admitting: Internal Medicine

## 2021-08-07 NOTE — Telephone Encounter (Signed)
Scheduled appts per 8/11 los. Pt aware.

## 2021-08-13 ENCOUNTER — Other Ambulatory Visit: Payer: Self-pay | Admitting: *Deleted

## 2021-08-13 DIAGNOSIS — I4891 Unspecified atrial fibrillation: Secondary | ICD-10-CM

## 2021-08-13 MED ORDER — WARFARIN SODIUM 2.5 MG PO TABS: ORAL_TABLET | ORAL | 0 refills | Status: DC

## 2021-08-14 ENCOUNTER — Encounter: Payer: Self-pay | Admitting: *Deleted

## 2021-08-17 ENCOUNTER — Telehealth: Payer: Self-pay | Admitting: Family

## 2021-08-17 NOTE — Telephone Encounter (Signed)
Will give [employer] nurse message since she is familiar with pt and faxed forms

## 2021-08-21 ENCOUNTER — Encounter: Payer: Self-pay | Admitting: *Deleted

## 2021-08-24 ENCOUNTER — Encounter: Payer: Self-pay | Admitting: Family

## 2021-08-24 ENCOUNTER — Encounter: Payer: Self-pay | Admitting: Internal Medicine

## 2021-08-24 ENCOUNTER — Ambulatory Visit (INDEPENDENT_AMBULATORY_CARE_PROVIDER_SITE_OTHER): Payer: HMO | Admitting: Family

## 2021-08-24 ENCOUNTER — Other Ambulatory Visit: Payer: Self-pay

## 2021-08-24 VITALS — BP 148/74 | HR 57 | Temp 97.0°F | Ht 71.0 in | Wt 266.8 lb

## 2021-08-24 DIAGNOSIS — C229 Malignant neoplasm of liver, not specified as primary or secondary: Secondary | ICD-10-CM

## 2021-08-24 DIAGNOSIS — I4891 Unspecified atrial fibrillation: Secondary | ICD-10-CM

## 2021-08-24 DIAGNOSIS — J449 Chronic obstructive pulmonary disease, unspecified: Secondary | ICD-10-CM | POA: Diagnosis not present

## 2021-08-24 DIAGNOSIS — Z7901 Long term (current) use of anticoagulants: Secondary | ICD-10-CM

## 2021-08-24 DIAGNOSIS — N1832 Chronic kidney disease, stage 3b: Secondary | ICD-10-CM

## 2021-08-24 DIAGNOSIS — I10 Essential (primary) hypertension: Secondary | ICD-10-CM | POA: Diagnosis not present

## 2021-08-24 DIAGNOSIS — Z5181 Encounter for therapeutic drug level monitoring: Secondary | ICD-10-CM

## 2021-08-24 DIAGNOSIS — E785 Hyperlipidemia, unspecified: Secondary | ICD-10-CM

## 2021-08-24 DIAGNOSIS — J45909 Unspecified asthma, uncomplicated: Secondary | ICD-10-CM

## 2021-08-24 DIAGNOSIS — Z23 Encounter for immunization: Secondary | ICD-10-CM

## 2021-08-24 LAB — COAGUCHEK XS/INR WAIVED
INR: 2.3 — ABNORMAL HIGH (ref 0.9–1.1)
Prothrombin Time: 27.4 s

## 2021-08-24 NOTE — Patient Instructions (Signed)
Description   INR  2.3 (at goal) Goal-2-3  Current dose of  2.5 mg daily.  Recheck in 2weeks.

## 2021-08-24 NOTE — Progress Notes (Signed)
Subjective:    Patient ID: Tammie [name], female    DOB: 1941/10/29, 80 y.o.   MRN: [id number]  Chief Complaint  Patient presents with   Pre-op Exam   Pt presents to the office today for chronic follow up.  She is followed by Oncologists every 4 months for hx of right breast cancer, right lung cancer, and Colon cancer with mets to the liver.   She currently has left breast cancer and is in the process of getting scheduled to removed the tumor.   She is currently taking warfarin for A Fib and hx CVA. Pt currently taking 2.5 mg every day .  Her INR today is 2.3 See anticoagulation flow sheet.    Denies any bleeding, bruising, medication changes, or diet changes. Hypertension This is a chronic problem. The current episode started more than 1 year ago. The problem has been waxing and waning since onset. The problem is uncontrolled. Associated symptoms include malaise/fatigue, peripheral edema and shortness of breath. Risk factors for coronary artery disease include diabetes mellitus, dyslipidemia, obesity and sedentary lifestyle. The current treatment provides moderate improvement. Hypertensive end-organ damage includes CAD/MI.  Congestive Heart Failure Presents for follow-up visit. Associated symptoms include shortness of breath.  Hyperlipidemia This is a chronic problem. The current episode started more than 1 year ago. Exacerbating diseases include obesity. Associated symptoms include shortness of breath. Current antihyperlipidemic treatment includes statins. The current treatment provides moderate improvement of lipids.  Asthma She complains of shortness of breath and wheezing. There is no cough. This is a chronic problem. The current episode started more than 1 year ago. Associated symptoms include malaise/fatigue. Her past medical history is significant for asthma.  COPD Has SOB.     Review of Systems  Constitutional:  Positive for malaise/fatigue.  Respiratory:  Positive for  shortness of breath and wheezing. Negative for cough.   All other systems reviewed and are negative.     Objective:   Physical Exam Vitals reviewed.  Constitutional:      General: She is not in acute distress.    Appearance: She is well-developed. She is obese.  HENT:     Head: Normocephalic and atraumatic.     Right Ear: Tympanic membrane normal.     Left Ear: Tympanic membrane normal.  Eyes:     Pupils: Pupils are equal, round, and reactive to light.  Neck:     Thyroid: No thyromegaly.  Cardiovascular:     Rate and Rhythm: Normal rate and regular rhythm.     Heart sounds: Murmur heard.  Pulmonary:     Effort: Pulmonary effort is normal. No respiratory distress.     Breath sounds: Normal breath sounds. No wheezing.  Abdominal:     General: Bowel sounds are normal. There is no distension.     Palpations: Abdomen is soft.     Tenderness: There is no abdominal tenderness.  Musculoskeletal:        General: No tenderness. Normal range of motion.     Cervical back: Normal range of motion and neck supple.     Right lower leg: Edema (3+) present.     Left lower leg: Edema (3+) present.  Skin:    General: Skin is warm and dry.     Comments: Bilateral purple discoloration of lower legs from PAD  Neurological:     Mental Status: She is alert and oriented to person, place, and time.     Cranial Nerves: No cranial nerve deficit.  Deep Tendon Reflexes: Reflexes are normal and symmetric.  Psychiatric:        Behavior: Behavior normal.        Thought Content: Thought content normal.        Judgment: Judgment normal.         BP (!) 148/74   Pulse (!) 57   Temp (!) 97 F (36.1 C) (Temporal)   Ht 5\' 11"  (1.803 m)   Wt 266 lb 12.8 oz (121 kg)   SpO2 95%   BMI 37.21 kg/m   Assessment & Plan:  Tammie [name] comes in today with chief complaint of Pre-op Exam   Diagnosis and orders addressed:  1. Atrial fibrillation, unspecified type ([place]) - CoaguChek XS/INR  Waived Description   INR  2.3 (at goal) Goal-2-3  Current dose of  2.5 mg daily.  Recheck in 2weeks.        2. Primary hypertension  3. COPD with chronic bronchitis ([place])  4. 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 Stage 3b chronic kidney disease (H[facility])  6. Warfarin anticoagulation  7. Encounter for therapeutic drug monitoring  8. Hyperlipidemia with target LDL less than 100  9. Uncomplicated asthma, unspecified asthma severity, unspecified whether persistent   Labs reviewed  Health Maintenance reviewed Diet and exercise encouraged  Follow up plan: 3 months    [name], FNP

## 2021-08-27 ENCOUNTER — Ambulatory Visit: Payer: Self-pay | Admitting: General Surgery

## 2021-08-27 ENCOUNTER — Other Ambulatory Visit: Payer: Self-pay | Admitting: Family

## 2021-09-01 ENCOUNTER — Encounter: Payer: Self-pay | Admitting: *Deleted

## 2021-09-01 ENCOUNTER — Telehealth: Payer: Self-pay | Admitting: Family

## 2021-09-01 NOTE — Telephone Encounter (Signed)
Pt states she had been given Lovenox in the past to do injections with surgery. Is this something she needs?

## 2021-09-01 NOTE — Telephone Encounter (Signed)
  Prescription Request  09/01/2021  Is this a "Controlled Substance" medicine? NO Have you seen your PCP in the last 2 weeks? yes If YES, route message to pool  -  If NO, patient needs to be scheduled for appointment.  What is the name of the medication or equipment?albuterol sulfate  Have you contacted your pharmacy to request a refill? yes  Which pharmacy would you like this sent to? [facility]   Patient notified that their request is being sent to the clinical staff for review and that they should receive a response within 2 business days.

## 2021-09-02 ENCOUNTER — Other Ambulatory Visit: Payer: Self-pay | Admitting: Family

## 2021-09-02 DIAGNOSIS — I10 Essential (primary) hypertension: Secondary | ICD-10-CM

## 2021-09-03 ENCOUNTER — Encounter (HOSPITAL_BASED_OUTPATIENT_CLINIC_OR_DEPARTMENT_OTHER): Payer: Self-pay | Admitting: General Surgery

## 2021-09-03 ENCOUNTER — Other Ambulatory Visit: Payer: Self-pay

## 2021-09-03 NOTE — Progress Notes (Signed)
Chart reviewed with Dr. Valma Cava. Patient uses O2 2L at hs only, her sats during the day usually are 92-95%. History of Afib on coumadin. Waiting to her from PCP for coumadin hold time. Cardiac clearance in epic. Ok to procedure with surgery at [facility].

## 2021-09-03 NOTE — Telephone Encounter (Signed)
I will call patient tomorrow at 3pm Thanks!

## 2021-09-03 NOTE — Telephone Encounter (Signed)
Please schedule appt with Almyra Free as CCM.

## 2021-09-04 ENCOUNTER — Ambulatory Visit (INDEPENDENT_AMBULATORY_CARE_PROVIDER_SITE_OTHER): Payer: HMO | Admitting: Pharmacist

## 2021-09-04 DIAGNOSIS — I4891 Unspecified atrial fibrillation: Secondary | ICD-10-CM

## 2021-09-04 MED ORDER — ENOXAPARIN SODIUM 120 MG/0.8ML IJ SOSY: 120.0000 mg | PREFILLED_SYRINGE | INTRAMUSCULAR | 0 refills | Status: DC

## 2021-09-04 NOTE — Progress Notes (Signed)
Chronic Care Management Pharmacy Note  09/04/2021 Name:  Tammie [name] MRN:  [id number] DOB:  [birth year]  Summary: AFIB-WARFARIN/ENOX [place] SURGERY 9/16  Recommendations/Changes made from today's visit: Atrial Fibrillation: Controlled; current rate/rhythm control- METOPROLOL; anticoagulant treatment: WARFARIN CHADS2VASc score: 6 Home blood pressure, heart rate readings:  Recommended LOVENOX BRIDGE for sugery--procedure planned on 09/11/21    Decision was made to bridge based on CHADSVAsc score of 6 and past use of enoxaparin bridging prior to previous procedures Patient notified of plan and copy of below plan left for her to pick up    [place] for Tammie [name] Weight = 124kg Est Cr Clearance is 29m/min - therefore will adjust dose from q12 h to q24h Diagnosis - Atrial Fibrillation; TIA; active cancer Current warfarin dose = 517mtablets - take  tablet daily except none on Fridays    SATURDAY, September 10th Last dose of warfarin    Sunday, September 11th No warfarin Start Lovenox/enoxaparin 12040mnce a day  MONDAY, September 12th No warfarin Lovenox/enoxaparin 120m70mce a day   TUESDAY, September 13th No warfarin Lovenox/enoxaparin 120mg71me a day  [facility] No [facility] Lovenox/enoxaparin 120mg 71m a day  THURSDAY, September 15th   No warfarin No Lovenox/enoxaparin  FRIDAY, September 16th Day of Procedure No warfarin No Lovenox / enoxaparin  SATURDAY, September 17th Restart warfarin - Take 1 and 1/2 tablets or 7.5mg Re65mrt Lovenox/enoxaparin 120mg on34m day    SUNDAY, September 18th Take 1 and 1/2 tablets or 7.5mg of w7marin Lovenox / enoxaparin 120mg once51may  MONDAY, September 19th   CHECK INR AT HOME & REPORT TO PCP OFFICE     Follow Up Plan: Telephone follow up appointment with care management team member scheduled for: 09/15/21  Subjective: Tammie M GarJUSTIN BUECHNERy.o. ye66 old female who is a primary patient of Hawks,  ChSharion Balloone CCM team was consulted for assistance with disease management and care coordination needs.    Engaged with patient by telephone for initial visit in response to provider referral for pharmacy case management and/or care coordination services.   Consent to Services:  The patient was given the following information about Chronic Care Management services today, agreed to services, and gave verbal consent: 1. CCM service includes personalized support from designated clinical staff supervised by the primary care provider, including individualized plan of care and coordination with other care providers 2. 24/7 contact phone numbers for assistance for urgent and routine care needs. 3. Service will only be billed when office clinical staff spend 20 minutes or more in a month to coordinate care. 4. Only one practitioner may furnish and bill the service in a calendar month. 5.The patient may stop CCM services at any time (effective at the end of the month) by phone call to the office staff. 6. The patient will be responsible for cost sharing (co-pay) of up to 20% of the service fee (after annual deductible is met). Patient agreed to services and consent obtained.  Patient Care Team: Hawks, ChrSharion BalloonCP - General (Family Medicine) Hochrein, Minus BreedingP - Cardiology (Cardiology) Grapey, DaRana Snaretive) as Consulting Physician (Urology) [place]ford, RRexene Agenttending Physician (Nephrology) [name], MCurt Bearsnsulting Physician (Oncology) Wentworth,Thea Silversmithnsulting Physician (Radiation Oncology) Lupton, FrDruscilla Browniensulting Physician (Dermatology) Toth, PaulJovita Kussmaulnsulting Physician (General Surgery) HendricksoMelrose Nakayamansulting Physician (Cardiothoracic Surgery) Martin,Hassell Done  Rachel Bo, OD as Consulting Physician (Optometry) Mauro Kaufmann, RN as Oncology Nurse Navigator Rockwell Germany, RN as Oncology Nurse  Navigator  Objective:  Lab Results  Component Value Date   CREATININE 1.79 (H) 08/04/2021   CREATININE 1.60 (H) 07/06/2021   CREATININE 1.68 (H) 06/05/2021    Lab Results  Component Value Date   HGBA1C 5.4 07/26/2016   Last diabetic Eye exam: No results found for: HMDIABEYEEXA  Last diabetic Foot exam: No results found for: HMDIABFOOTEX      Component Value Date/Time   CHOL 142 01/22/2019 1222   TRIG 199 (H) 01/22/2019 1222   HDL 44 01/22/2019 1222   CHOLHDL 3.2 01/22/2019 1222   LDLCALC 58 01/22/2019 1222    Hepatic Function Latest Ref Rng & Units 08/04/2021 02/02/2021 12/29/2020  Total Protein 6.5 - 8.1 g/dL 7.1 7.2 7.1  Albumin 3.5 - 5.0 g/dL 3.5 3.9 4.5  AST 15 - 41 U/L '16 16 18  ' ALT 0 - 44 U/L '10 13 12  ' Alk Phosphatase 38 - 126 U/L 51 57 48  Total Bilirubin 0.3 - 1.2 mg/dL 0.4 0.4 0.4  Bilirubin, Direct 0.01 - 0.4 mg/dL - - -    Lab Results  Component Value Date/Time   TSH 2.275 03/22/2019 12:44 PM   TSH 3.194 12/21/2018 10:14 AM   TSH 3.443 12/22/2017 10:13 AM   TSH 4.019 (H) 11/10/2017 11:29 AM   TSH 3.970 07/26/2016 12:03 PM   TSH 1.223 03/09/2012 06:40 AM    CBC Latest Ref Rng & Units 08/04/2021 02/02/2021 12/29/2020  WBC 4.0 - 10.5 K/uL 6.0 6.3 6.7  Hemoglobin 12.0 - 15.0 g/dL 12.9 14.4 15.5  Hematocrit 36.0 - 46.0 % 40.5 44.8 47.9(H)  Platelets 150 - 400 K/uL 183 150 165    No results found for: VD25OH  Clinical ASCVD: Yes  The ASCVD Risk score (Arnett DK, et al., 2019) failed to calculate for the following reasons:   The 2019 ASCVD risk score is only valid for ages 39 to 57   The patient has a prior MI or stroke diagnosis    Other: (CHADS2VASc if Afib, PHQ9 if depression, MMRC or CAT for COPD, ACT, DEXA)  Social History   Tobacco Use  Smoking Status Former   Packs/day: 3.00   Years: 32.00   Pack years: 96.00   Types: Cigarettes   Start date: 12/27/1989   Quit date: 03/08/1990   Years since quitting: 31.5  Smokeless Tobacco Former   Quit date:  03/08/1990  Tobacco Comments   smoked 3ppd from 1959-1991    BP Readings from Last 3 Encounters:  08/24/21 (!) 148/74  08/06/21 (!) 143/82  07/28/21 (!) 148/67   Pulse Readings from Last 3 Encounters:  08/24/21 (!) 57  08/06/21 64  07/28/21 87   Wt Readings from Last 3 Encounters:  08/24/21 266 lb 12.8 oz (121 kg)  08/06/21 271 lb 8 oz (123.2 kg)  07/28/21 269 lb 8 oz (122.2 kg)    Assessment: Review of patient past medical history, allergies, medications, health status, including review of consultants reports, laboratory and other test data, was performed as part of comprehensive evaluation and provision of chronic care management services.   SDOH:  (Social Determinants of Health) assessments and interventions performed:    CCM Care Plan  Allergies  Allergen Reactions   Tape Hives   Contrast Media [Iodinated Diagnostic Agents] Hives   Iohexol Other (See Comments)     Code: HIVES, Desc: hives w/ contrast '08//  better w/ benadryl    Sulfa Antibiotics Other (See Comments)    REACTION: unknown   Sulfamethoxazole-Trimethoprim Other (See Comments)    REACTION: unknown    Medications Reviewed Today     Reviewed by Geroge Baseman, RN (Registered Nurse) on 09/03/21 at 1218  Med List Status: <None>   Medication Order Taking? Sig Documenting Provider Last Dose Status Informant  acetaminophen (TYLENOL) 650 MG CR tablet [id number]  Take 650 mg by mouth 3 (three) times daily as needed for pain.  [provider]  Active Self           Med Note Dorrene German, NICOLE L   Fri Jun 27, 2015  9:23 AM) .   albuterol (PROAIR HFA) 108 (90 Base) MCG/ACT inhaler [id number]  Inhale 2 puffs into the lungs every 6 (six) hours as needed for wheezing or shortness of breath. [name], MD  Active   amLODipine (NORVASC) 10 MG tablet [id number]  Take 1 tablet (10 mg total) by mouth daily. [name] A, FNP  Active   beclomethasone (QVAR) 40 MCG/ACT inhaler [id number] Yes Inhale 1 puff into  the lungs 2 (two) times daily. [provider] 09/03/2021 Active Self  diphenhydrAMINE (BENADRYL) 25 MG tablet [id number]  Take 25 mg by mouth every 6 (six) hours as needed for allergies. [provider]  Active Self  Ferrous Sulfate (IRON) 142 (45 Fe) MG TBCR [id number]  Take 1 tablet by mouth daily. [provider]  Active Self  Flaxseed, Linseed, (FLAXSEED OIL PO) [id number]  Take 1 tablet by mouth at bedtime. [provider]  Active Self  furosemide (LASIX) 20 MG tablet [id number]  Take 1 tablet (20 mg total) by mouth 2 (two) times daily. [name], FNP  Active   Garlic 6381 MG CAPS [id number]  Take 1,000 mg by mouth 2 (two) times daily. [provider]  Active Self  ketoconazole (NIZORAL) 2 % cream [id number]  Apply 1 application topically daily. [name] A, FNP  Active   letrozole [facility]) 2.5 MG tablet [id number]  Take 1 tablet (2.5 mg total) by mouth daily. Nicholas Lose, MD  Active   metoprolol tartrate (LOPRESSOR) 100 MG tablet [id number]  TAKE 1 & 1/2 (ONE & ONE-HALF) TABLETS BY MOUTH TWICE DAILY . [name] A, FNP  Active   nystatin (MYCOSTATIN/NYSTOP) powder [id number]  Apply 1 application topically 3 (three) times daily. [name] A, FNP  Active   Omega-3 Fatty Acids (FISH OIL) 1000 MG CAPS [id number]  Take 1 capsule by mouth daily. [provider]  Active Self  simvastatin (ZOCOR) 10 MG tablet [id number]  TAKE 1 TABLET BY MOUTH ONCE DAILY AT  [address], Christy A, FNP  Active   warfarin (COUMADIN) 2.5 MG tablet [id number]  TAKE ONE TABLET BY MOUTH ONCE DAILY EXCEPT 1/2 TABLET ON FRIDAYS. 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 ([place])    Cancer with unknown primary site [facility])    Liver mass 10/10/2016  Neuropathy 07/26/2016   Antineoplastic chemotherapy induced anemia 03/24/2015   CKD (chronic kidney disease), stage III ([place]) 03/24/2015   Hyperlipidemia with target LDL less than 100 03/10/2015   Peripheral edema 03/10/2015   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 ([place]) 08/29/2014   Malignant neoplasm of upper-inner quadrant of left breast in female, estrogen receptor positive ([place]) 11/13/2013   COPD with chronic bronchitis ([place]) 03/13/2012   HTN (hypertension) 03/13/2012   Atrial fibrillation ([place]) 03/08/2012   Asthma 04/17/2011   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   DOE (dyspnea on exertion) 09/04/2010   CVA 05/21/2009    Immunization History  Administered Date(s) Administered   Fluad Quad(high Dose 65+) 10/12/2019, 09/29/2020   Influenza Whole 09/26/2009, 09/26/2010   Influenza, High Dose Seasonal PF 09/27/2016, 09/29/2018   Influenza,inj,Quad PF,6+ Mos 10/29/2013, 09/10/2014, 11/24/2015, 09/23/2017   Moderna Sars-Covid-2 Vaccination 02/21/2020, 03/21/2020   PFIZER Comirnaty([place])Covid-19 Tri-Sucrose Vaccine 02/04/2021   Pneumococcal Conjugate-13 06/16/2012   Pneumococcal Polysaccharide-23 09/26/2008   Tdap 07/20/2012   Zoster Recombinat (Shingrix) 08/24/2021    Conditions to be addressed/monitored: Atrial Fibrillation  Care Plan : PHARMD MEDICATION MANAGEMENT  Updates made by Lavera Guise, [place] since 09/05/2021 12:00 AM     Problem: DISEASE PROGRESSION PREVENTION      Long-Range Goal: ATRIAL FIBRILLATION   This Visit's Progress: On track  Priority: High  Note:   Current Barriers:  Unable to independently monitor therapeutic efficacy Unable to maintain control of  INR-BRIDGE FOR SURGERY  Pharmacist Clinical Goal(s):  Over the next 90 days, patient will achieve adherence to monitoring guidelines and medication adherence to achieve therapeutic efficacy achieve control of ANTICOAGULATION as evidenced by GOAL INR--BRIDGE LOVENOX FOR SURGERY  through collaboration with PharmD and provider.    Interventions: 1:1 collaboration with [name], FNP regarding development and update of comprehensive plan of care as evidenced by provider attestation and co-signature Inter-disciplinary care team collaboration (see longitudinal plan of care) Comprehensive medication review performed; medication list updated in electronic medical record  Atrial Fibrillation: Controlled; current rate/rhythm control- METOPROLOL; anticoagulant treatment: WARFARIN CHADS2VASc score: 6 Home blood pressure, heart rate readings:  Recommended LOVENOX BRIDGE for sugery--procedure planned on 09/11/21    Decision was made to bridge based on CHADSVAsc score of 6 and past use of enoxaparin bridging prior to previous procedures Patient notified of plan and copy of below plan left for her to pick up    [place] for Tammie [name] Weight = 124kg Est Cr Clearance is 62m/min - therefore will adjust dose from q12 h to q24h Diagnosis - Atrial Fibrillation; TIA; active cancer Current warfarin dose = 543mtablets - take  tablet daily except none on Fridays    SATURDAY, September 10th Last dose of warfarin    Sunday, September 11th No warfarin Start Lovenox/enoxaparin 12075mnce a day  MONDAY, September 12th No warfarin Lovenox/enoxaparin 120m77mce a day   TUESDAY, September 13th No warfarin Lovenox/enoxaparin 120mg63me a day  [facility] No warfarin Lovenox/enoxaparin 120mg 92m a day  THURSDAY, September 15th   No warfarin No Lovenox/enoxaparin  FRIDAY, September 16th Day of Procedure No warfarin No Lovenox / enoxaparin  SATURDAY, September 17th  Restart warfarin - Take 1 and 1/2 tablets or 7.5mg Re24mrt Lovenox/enoxaparin 120mg on74m day    SUNDAY, September 18th Take 1 and 1/2 tablets or 7.5mg of w56marin Lovenox / enoxaparin 120mg once61may  MONDAY, September 19th   CHECK INR AT HOME & REPORT TO PCP  OFFICE      Patient Goals/Self-Care Activities Over the next 90 days, patient will:  - take medications as prescribed INRs weekly at home  Follow Up Plan: Telephone follow up appointment with care management team member scheduled for: 09/15/21      Medication Assistance: None required.  Patient affirms current coverage meets needs.  Patient's preferred pharmacy is:  [facility] [address] [place] - [address] [place] [address] Phone: [phone] Fax: [phone]  [facility]) - [place], [place] [address] Phone: [phone] Fax: [phone]   Follow Up:  Patient agrees to Care Plan and Follow-up.  Plan: Telephone follow up appointment with care management team member scheduled for:  09/15/21  Regina Eck, PharmD, BCPS Clinical Pharmacist, [place]  II Phone [phone]

## 2021-09-04 NOTE — Progress Notes (Signed)
Patient aware to stop coumadin on Sunday as per directed by Dr. Gwendolyn Fill office.  She is awaiting a call from pharmacist at 3pm today to confirm if she will need lovenox injections to replace coumadin

## 2021-09-05 NOTE — Patient Instructions (Signed)
Visit Information  PATIENT GOALS:  Goals Addressed               This Visit's Progress     Patient Stated     AFIB-PHARMD (pt-stated)        Current Barriers:  Unable to independently monitor therapeutic efficacy Unable to maintain control of INR-BRIDGE FOR SURGERY  Pharmacist Clinical Goal(s):  Over the next 90 days, patient will achieve adherence to monitoring guidelines and medication adherence to achieve therapeutic efficacy achieve control of ANTICOAGULATION as evidenced by GOAL INR--BRIDGE LOVENOX FOR SURGERY through collaboration with PharmD and provider.    Interventions: 1:1 collaboration with [name], FNP regarding development and update of comprehensive plan of care as evidenced by provider attestation and co-signature Inter-disciplinary care team collaboration (see longitudinal plan of care) Comprehensive medication review performed; medication list updated in electronic medical record  Atrial Fibrillation: Controlled; current rate/rhythm control- METOPROLOL; anticoagulant treatment: WARFARIN CHADS2VASc score: 6 Home blood pressure, heart rate readings:  Recommended LOVENOX BRIDGE for sugery--procedure planned on 09/11/21    Decision was made to bridge based on CHADSVAsc score of 6 and past use of enoxaparin bridging prior to previous procedures Patient notified of plan and copy of below plan left for her to pick up    [place] for Peyson Delao Weight = 124kg Est Cr Clearance is 55mL/min - therefore will adjust dose from q12 h to q24h Diagnosis - Atrial Fibrillation; TIA; active cancer Current warfarin dose = 5mg  tablets - take  tablet daily except none on Fridays    SATURDAY, September 10th Last dose of warfarin    Sunday, September 11th No warfarin Start Lovenox/enoxaparin 120mg  once a day  MONDAY, September 12th No warfarin Lovenox/enoxaparin 120mg  once a day   TUESDAY, September 13th No warfarin Lovenox/enoxaparin 120mg  once  a day  [facility], September 14th No warfarin Lovenox/enoxaparin 120mg  once a day  THURSDAY, September 15th   No warfarin No Lovenox/enoxaparin  FRIDAY, September 16th Day of Procedure No warfarin No Lovenox / enoxaparin  SATURDAY, September 17th Restart warfarin - Take 1 and 1/2 tablets or 7.5mg  Restart Lovenox/enoxaparin 120mg  once a day    SUNDAY, September 18th Take 1 and 1/2 tablets or 7.5mg  of warfarin Lovenox / enoxaparin 120mg  once a day  MONDAY, September 19th   CHECK INR AT HOME & REPORT TO PCP OFFICE      Patient Goals/Self-Care Activities Over the next 90 days, patient will:  - take medications as prescribed INRs weekly at home  Follow Up Plan: Telephone follow up appointment with care management team member scheduled for: 09/15/21         The patient verbalized understanding of instructions, educational materials, and care plan provided today and declined offer to receive copy of patient instructions, educational materials, and care plan.   Telephone follow up appointment with care management team member scheduled for: 09/15/21  Signature Regina Eck, PharmD, BCPS Clinical Pharmacist, [place]  II Phone [phone]

## 2021-09-07 ENCOUNTER — Other Ambulatory Visit: Payer: Self-pay | Admitting: Family

## 2021-09-07 NOTE — Telephone Encounter (Signed)
Tammie [name],  You seen pt in August and she does have a follow up scheduled in November. You have not prescribed these medications in the past. Please review to see if ok for refill.  System would not let me pend Qvar inhaler for refill

## 2021-09-07 NOTE — Telephone Encounter (Signed)
  Prescription Request  09/07/2021  Is this a "Controlled Substance" medicine? No  Have you seen your PCP in the last 2 weeks? Yes  If YES, route message to pool  -  If NO, patient needs to be scheduled for appointment.  What is the name of the medication or equipment? Qvar-40mg  or Proair-40mg   Have you contacted your pharmacy to request a refill? yes  Which pharmacy would you like this sent to? [facility]   Patient notified that their request is being sent to the clinical staff for review and that they should receive a response within 2 business days.    Lenna Gilford' pt.  She has been waiting to hear back from nurse. She has 2-3 times already. She needs meds.

## 2021-09-08 MED ORDER — ALBUTEROL SULFATE HFA 108 (90 BASE) MCG/ACT IN AERS: 2.0000 | INHALATION_SPRAY | Freq: Four times a day (QID) | RESPIRATORY_TRACT | 1 refills | Status: DC | PRN

## 2021-09-09 ENCOUNTER — Other Ambulatory Visit: Payer: Self-pay | Admitting: Family

## 2021-09-09 DIAGNOSIS — I10 Essential (primary) hypertension: Secondary | ICD-10-CM

## 2021-09-10 ENCOUNTER — Encounter (HOSPITAL_BASED_OUTPATIENT_CLINIC_OR_DEPARTMENT_OTHER)
Admission: RE | Admit: 2021-09-10 | Discharge: 2021-09-10 | Disposition: A | Discharge status: Home / Self Care | Payer: HMO | Source: Ambulatory Visit | Attending: General Surgery | Admitting: General Surgery

## 2021-09-10 ENCOUNTER — Telehealth: Payer: Self-pay | Admitting: Internal Medicine

## 2021-09-10 DIAGNOSIS — Z9981 Dependence on supplemental oxygen: Secondary | ICD-10-CM | POA: Diagnosis not present

## 2021-09-10 DIAGNOSIS — Z7901 Long term (current) use of anticoagulants: Secondary | ICD-10-CM | POA: Diagnosis not present

## 2021-09-10 DIAGNOSIS — C189 Malignant neoplasm of colon, unspecified: Secondary | ICD-10-CM | POA: Diagnosis not present

## 2021-09-10 DIAGNOSIS — Z91041 Radiographic dye allergy status: Secondary | ICD-10-CM | POA: Diagnosis not present

## 2021-09-10 DIAGNOSIS — C3490 Malignant neoplasm of unspecified part of unspecified bronchus or lung: Secondary | ICD-10-CM | POA: Diagnosis not present

## 2021-09-10 DIAGNOSIS — I509 Heart failure, unspecified: Secondary | ICD-10-CM | POA: Diagnosis not present

## 2021-09-10 DIAGNOSIS — Z87891 Personal history of nicotine dependence: Secondary | ICD-10-CM | POA: Diagnosis not present

## 2021-09-10 DIAGNOSIS — Z853 Personal history of malignant neoplasm of breast: Secondary | ICD-10-CM | POA: Diagnosis not present

## 2021-09-10 DIAGNOSIS — I110 Hypertensive heart disease with heart failure: Secondary | ICD-10-CM | POA: Diagnosis not present

## 2021-09-10 DIAGNOSIS — Z79899 Other long term (current) drug therapy: Secondary | ICD-10-CM | POA: Diagnosis not present

## 2021-09-10 DIAGNOSIS — Z803 Family history of malignant neoplasm of breast: Secondary | ICD-10-CM | POA: Diagnosis not present

## 2021-09-10 DIAGNOSIS — Z888 Allergy status to other drugs, medicaments and biological substances status: Secondary | ICD-10-CM | POA: Diagnosis not present

## 2021-09-10 DIAGNOSIS — Z881 Allergy status to other antibiotic agents status: Secondary | ICD-10-CM | POA: Diagnosis not present

## 2021-09-10 DIAGNOSIS — Z170 Estrogen receptor positive status [ER+]: Secondary | ICD-10-CM | POA: Diagnosis not present

## 2021-09-10 DIAGNOSIS — C50212 Malignant neoplasm of upper-inner quadrant of left female breast: Secondary | ICD-10-CM | POA: Diagnosis not present

## 2021-09-10 DIAGNOSIS — Z923 Personal history of irradiation: Secondary | ICD-10-CM | POA: Diagnosis not present

## 2021-09-10 LAB — BASIC METABOLIC PANEL
Anion gap: 9 (ref 5–15)
BUN: 28 mg/dL — ABNORMAL HIGH (ref 8–23)
CO2: 26 mmol/L (ref 22–32)
Calcium: 9.1 mg/dL (ref 8.9–10.3)
Chloride: 105 mmol/L (ref 98–111)
Creatinine, Ser: 1.51 mg/dL — ABNORMAL HIGH (ref 0.44–1.00)
GFR, Estimated: 35 mL/min — ABNORMAL LOW (ref >60)
Glucose, Bld: 87 mg/dL (ref 70–99)
Potassium: 5.2 mmol/L — ABNORMAL HIGH (ref 3.5–5.1)
Sodium: 140 mmol/L (ref 135–145)

## 2021-09-10 LAB — PROTIME-INR
INR: 1.3 — ABNORMAL HIGH (ref 0.8–1.2)
Prothrombin Time: 16.6 seconds — ABNORMAL HIGH (ref 11.4–15.2)

## 2021-09-10 NOTE — Progress Notes (Signed)

## 2021-09-10 NOTE — Telephone Encounter (Signed)
Scheduled per sch msg. Called and left msg  

## 2021-09-10 NOTE — Progress Notes (Signed)
Reminded pt to come in for lab work, pt states will be after 12.

## 2021-09-11 ENCOUNTER — Other Ambulatory Visit: Payer: Self-pay

## 2021-09-11 ENCOUNTER — Ambulatory Visit (HOSPITAL_BASED_OUTPATIENT_CLINIC_OR_DEPARTMENT_OTHER)
Admission: RE | Admit: 2021-09-11 | Discharge: 2021-09-11 | Disposition: A | Discharge status: Home / Self Care | Payer: HMO | Attending: General Surgery | Admitting: General Surgery

## 2021-09-11 ENCOUNTER — Encounter (HOSPITAL_BASED_OUTPATIENT_CLINIC_OR_DEPARTMENT_OTHER): Payer: Self-pay | Admitting: General Surgery

## 2021-09-11 ENCOUNTER — Encounter (HOSPITAL_BASED_OUTPATIENT_CLINIC_OR_DEPARTMENT_OTHER)
Admission: RE | Disposition: A | Discharge status: Home / Self Care | Payer: Self-pay | Source: Home / Self Care | Attending: General Surgery

## 2021-09-11 ENCOUNTER — Ambulatory Visit (HOSPITAL_BASED_OUTPATIENT_CLINIC_OR_DEPARTMENT_OTHER): Payer: HMO | Admitting: Certified Registered"

## 2021-09-11 DIAGNOSIS — C189 Malignant neoplasm of colon, unspecified: Secondary | ICD-10-CM | POA: Diagnosis not present

## 2021-09-11 DIAGNOSIS — Z881 Allergy status to other antibiotic agents status: Secondary | ICD-10-CM | POA: Insufficient documentation

## 2021-09-11 DIAGNOSIS — C50212 Malignant neoplasm of upper-inner quadrant of left female breast: Secondary | ICD-10-CM | POA: Insufficient documentation

## 2021-09-11 DIAGNOSIS — C785 Secondary malignant neoplasm of large intestine and rectum: Secondary | ICD-10-CM | POA: Insufficient documentation

## 2021-09-11 DIAGNOSIS — Z87891 Personal history of nicotine dependence: Secondary | ICD-10-CM | POA: Insufficient documentation

## 2021-09-11 DIAGNOSIS — C7800 Secondary malignant neoplasm of unspecified lung: Secondary | ICD-10-CM | POA: Insufficient documentation

## 2021-09-11 DIAGNOSIS — Z888 Allergy status to other drugs, medicaments and biological substances status: Secondary | ICD-10-CM | POA: Insufficient documentation

## 2021-09-11 DIAGNOSIS — Z7901 Long term (current) use of anticoagulants: Secondary | ICD-10-CM | POA: Insufficient documentation

## 2021-09-11 DIAGNOSIS — Z170 Estrogen receptor positive status [ER+]: Secondary | ICD-10-CM | POA: Insufficient documentation

## 2021-09-11 DIAGNOSIS — C3490 Malignant neoplasm of unspecified part of unspecified bronchus or lung: Secondary | ICD-10-CM | POA: Insufficient documentation

## 2021-09-11 DIAGNOSIS — Z91041 Radiographic dye allergy status: Secondary | ICD-10-CM | POA: Insufficient documentation

## 2021-09-11 DIAGNOSIS — Z923 Personal history of irradiation: Secondary | ICD-10-CM | POA: Insufficient documentation

## 2021-09-11 DIAGNOSIS — Z803 Family history of malignant neoplasm of breast: Secondary | ICD-10-CM | POA: Insufficient documentation

## 2021-09-11 DIAGNOSIS — Z79899 Other long term (current) drug therapy: Secondary | ICD-10-CM | POA: Insufficient documentation

## 2021-09-11 DIAGNOSIS — Z9981 Dependence on supplemental oxygen: Secondary | ICD-10-CM | POA: Insufficient documentation

## 2021-09-11 DIAGNOSIS — I509 Heart failure, unspecified: Secondary | ICD-10-CM | POA: Insufficient documentation

## 2021-09-11 DIAGNOSIS — Z853 Personal history of malignant neoplasm of breast: Secondary | ICD-10-CM | POA: Insufficient documentation

## 2021-09-11 DIAGNOSIS — I110 Hypertensive heart disease with heart failure: Secondary | ICD-10-CM | POA: Insufficient documentation

## 2021-09-11 HISTORY — DX: Malignant neoplasm of unspecified part of unspecified bronchus or lung: C34.90

## 2021-09-11 HISTORY — PX: BREAST LUMPECTOMY: SHX2

## 2021-09-11 SURGERY — BREAST LUMPECTOMY
Anesthesia: General | Site: Breast | Laterality: Left

## 2021-09-11 MED ORDER — HYDROCODONE-ACETAMINOPHEN 5-325 MG PO TABS: 1.0000 | ORAL_TABLET | Freq: Four times a day (QID) | ORAL | 0 refills | Status: DC | PRN

## 2021-09-11 MED ORDER — PROPOFOL 10 MG/ML IV BOLUS
INTRAVENOUS | Status: DC | PRN
  Administered 2021-09-11: 150 mg via INTRAVENOUS

## 2021-09-11 MED ORDER — LIDOCAINE HCL (CARDIAC) PF 100 MG/5ML IV SOSY
PREFILLED_SYRINGE | INTRAVENOUS | Status: DC | PRN
  Administered 2021-09-11: 60 mg via INTRAVENOUS

## 2021-09-11 MED ORDER — CEFAZOLIN SODIUM-DEXTROSE 2-4 GM/100ML-% IV SOLN
2.0000 g | INTRAVENOUS | Status: DC
  Administered 2021-09-11: 2 g via INTRAVENOUS

## 2021-09-11 MED ORDER — BUPIVACAINE-EPINEPHRINE 0.25% -1:200000 IJ SOLN
INTRAMUSCULAR | Status: DC | PRN
  Administered 2021-09-11: 20 mL

## 2021-09-11 MED ORDER — ONDANSETRON HCL 4 MG/2ML IJ SOLN
INTRAMUSCULAR | Status: DC | PRN
  Administered 2021-09-11: 4 mg via INTRAVENOUS

## 2021-09-11 MED ORDER — CHLORHEXIDINE GLUCONATE CLOTH 2 % EX PADS: 6.0000 | MEDICATED_PAD | Freq: Once | CUTANEOUS | Status: DC

## 2021-09-11 MED ORDER — ACETAMINOPHEN 500 MG PO TABS
ORAL_TABLET | ORAL | Status: AC
  Filled 2021-09-11: qty 2

## 2021-09-11 MED ORDER — DEXAMETHASONE SODIUM PHOSPHATE 4 MG/ML IJ SOLN
INTRAMUSCULAR | Status: DC | PRN
  Administered 2021-09-11: 4 mg via INTRAVENOUS

## 2021-09-11 MED ORDER — PROPOFOL 500 MG/50ML IV EMUL
INTRAVENOUS | Status: DC | PRN
  Administered 2021-09-11: 25 ug/kg/min via INTRAVENOUS

## 2021-09-11 MED ORDER — CEFAZOLIN IN SODIUM CHLORIDE 3-0.9 GM/100ML-% IV SOLN
INTRAVENOUS | Status: AC
  Filled 2021-09-11: qty 100

## 2021-09-11 MED ORDER — GABAPENTIN 300 MG PO CAPS
ORAL_CAPSULE | ORAL | Status: AC
  Filled 2021-09-11: qty 1

## 2021-09-11 MED ORDER — FENTANYL CITRATE (PF) 100 MCG/2ML IJ SOLN
INTRAMUSCULAR | Status: DC | PRN
  Administered 2021-09-11 (×2): 50 ug via INTRAVENOUS

## 2021-09-11 MED ORDER — FENTANYL CITRATE (PF) 100 MCG/2ML IJ SOLN: 25.0000 ug | INTRAMUSCULAR | Status: DC | PRN

## 2021-09-11 MED ORDER — FENTANYL CITRATE (PF) 100 MCG/2ML IJ SOLN
INTRAMUSCULAR | Status: AC
  Filled 2021-09-11: qty 2

## 2021-09-11 MED ORDER — ACETAMINOPHEN 10 MG/ML IV SOLN: 1000.0000 mg | Freq: Once | INTRAVENOUS | Status: DC | PRN

## 2021-09-11 MED ORDER — EPHEDRINE 5 MG/ML INJ
INTRAVENOUS | Status: AC
  Filled 2021-09-11: qty 5

## 2021-09-11 MED ORDER — LACTATED RINGERS IV SOLN: INTRAVENOUS | Status: DC

## 2021-09-11 MED ORDER — ACETAMINOPHEN 500 MG PO TABS
1000.0000 mg | ORAL_TABLET | ORAL | Status: AC
  Administered 2021-09-11: 1000 mg via ORAL

## 2021-09-11 MED ORDER — PHENYLEPHRINE 40 MCG/ML (10ML) SYRINGE FOR IV PUSH (FOR BLOOD PRESSURE SUPPORT)
PREFILLED_SYRINGE | INTRAVENOUS | Status: AC
  Filled 2021-09-11: qty 10

## 2021-09-11 MED ORDER — BUPIVACAINE-EPINEPHRINE (PF) 0.25% -1:200000 IJ SOLN
INTRAMUSCULAR | Status: AC
  Filled 2021-09-11: qty 30

## 2021-09-11 MED ORDER — CEFAZOLIN IN SODIUM CHLORIDE 3-0.9 GM/100ML-% IV SOLN: 3.0000 g | INTRAVENOUS | Status: DC

## 2021-09-11 MED ORDER — GABAPENTIN 300 MG PO CAPS: 300.0000 mg | ORAL_CAPSULE | ORAL | Status: DC

## 2021-09-11 SURGICAL SUPPLY — 41 items
ADH SKN CLS APL DERMABOND .7 (GAUZE/BANDAGES/DRESSINGS) ×1
APL PRP STRL LF DISP 70% ISPRP (MISCELLANEOUS) ×1
APPLIER CLIP 9.375 MED OPEN (MISCELLANEOUS)
APR CLP MED 9.3 20 MLT OPN (MISCELLANEOUS)
BLADE SURG 15 STRL LF DISP TIS (BLADE) ×1 IMPLANT
BLADE SURG 15 STRL SS (BLADE) ×2
CANISTER SUCT 1200ML W/VALVE (MISCELLANEOUS) ×2 IMPLANT
CHLORAPREP W/TINT 26 (MISCELLANEOUS) ×2 IMPLANT
CLIP APPLIE 9.375 MED OPEN (MISCELLANEOUS) IMPLANT
COVER BACK TABLE 60X90IN (DRAPES) ×2 IMPLANT
COVER MAYO STAND STRL (DRAPES) ×2 IMPLANT
DECANTER SPIKE VIAL GLASS SM (MISCELLANEOUS) ×2 IMPLANT
DERMABOND ADVANCED (GAUZE/BANDAGES/DRESSINGS) ×1
DERMABOND ADVANCED .7 DNX12 (GAUZE/BANDAGES/DRESSINGS) ×1 IMPLANT
DRAPE LAPAROSCOPIC ABDOMINAL (DRAPES) ×2 IMPLANT
DRAPE UTILITY XL STRL (DRAPES) ×2 IMPLANT
ELECT BLADE 4.0 EZ CLEAN MEGAD (MISCELLANEOUS)
ELECT COATED BLADE 2.86 ST (ELECTRODE) ×2 IMPLANT
ELECT REM PT RETURN 9FT ADLT (ELECTROSURGICAL) ×2
ELECTRODE BLDE 4.0 EZ CLN MEGD (MISCELLANEOUS) IMPLANT
ELECTRODE REM PT RTRN 9FT ADLT (ELECTROSURGICAL) ×1 IMPLANT
GLOVE SURG ENC MOIS LTX SZ7.5 (GLOVE) ×2 IMPLANT
GOWN STRL REUS W/ TWL LRG LVL3 (GOWN DISPOSABLE) ×2 IMPLANT
GOWN STRL REUS W/TWL LRG LVL3 (GOWN DISPOSABLE) ×4
ILLUMINATOR WAVEGUIDE N/F (MISCELLANEOUS) IMPLANT
KIT MARKER MARGIN INK (KITS) ×2 IMPLANT
LIGHT WAVEGUIDE WIDE FLAT (MISCELLANEOUS) IMPLANT
NEEDLE HYPO 25X1 1.5 SAFETY (NEEDLE) ×2 IMPLANT
NS IRRIG 1000ML POUR BTL (IV SOLUTION) ×2 IMPLANT
PACK BASIN DAY SURGERY FS (CUSTOM PROCEDURE TRAY) ×2 IMPLANT
PENCIL SMOKE EVACUATOR (MISCELLANEOUS) ×2 IMPLANT
SLEEVE SCD COMPRESS KNEE MED (STOCKING) ×2 IMPLANT
SPONGE T-LAP 18X18 ~~LOC~~+RFID (SPONGE) ×2 IMPLANT
SUT MON AB 4-0 PC3 18 (SUTURE) ×2 IMPLANT
SUT SILK 2 0 SH (SUTURE) ×2 IMPLANT
SUT VICRYL 3-0 CR8 SH (SUTURE) ×2 IMPLANT
SYR CONTROL 10ML LL (SYRINGE) ×2 IMPLANT
TOWEL GREEN STERILE FF (TOWEL DISPOSABLE) ×2 IMPLANT
TRAY FAXITRON CT DISP (TRAY / TRAY PROCEDURE) IMPLANT
TUBE CONNECTING 20X1/4 (TUBING) ×2 IMPLANT
YANKAUER SUCT BULB TIP NO VENT (SUCTIONS) ×2 IMPLANT

## 2021-09-11 NOTE — Transfer of Care (Signed)
Immediate Anesthesia Transfer of Care Note  Patient: Tammie [name]  Procedure(s) Performed: LEFT BREAST LUMPECTOMY (Left: Breast)  Patient Location: PACU  Anesthesia Type:General  Level of Consciousness: drowsy and patient cooperative  Airway & Oxygen Therapy: Patient Spontanous Breathing and Patient connected to face mask oxygen  Post-op Assessment: Report given to RN and Post -op Vital signs reviewed and stable  Post vital signs: Reviewed and stable  Last Vitals:  Vitals Value Taken Time  BP    Temp    Pulse 67 09/11/21 1357  Resp    SpO2 98 % 09/11/21 1357  Vitals shown include unvalidated device data.  Last Pain:  Vitals:   09/11/21 1245  TempSrc: Oral  PainSc: 0-No pain      Patients Stated Pain Goal: 2 (03/50/09 3818)  Complications: No notable events documented.

## 2021-09-11 NOTE — H&P (Signed)
REFERRING PHYSICIAN: [name], NP  PROVIDER: Landry Corporal, MD  MRN: [id number] DOB: Feb 22, 1941  Subjective   Chief Complaint: Breast Cancer   History of Present Illness: Tammie [name] is a 80 y.o. female who is seen today as an office consultation at the request of Dr. Lenna Gilford for evaluation of Breast Cancer .   We are asked to see the patient in consultation by Dr. [name] to evaluate her for a new left breast cancer. The patient is a 80 year old white female who recently felt a mass in the inner aspect of the left breast. She went to the doctor and was evaluated. The mass was 1.8 cm by ultrasound. The lymph nodes looked normal. 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 The tumor markers are all pending. 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 She also has lung cancer and metastatic colon cancer. She has been receiving ongoing therapy for these. She also has a history of congestive heart failure and is on home oxygen.  Review of Systems: A complete review of systems was obtained from the patient. I have reviewed this information and discussed as appropriate with the patient. See HPI as well for other ROS.  Review of Systems  HENT: Negative.  Eyes: Negative.  Respiratory: Positive for shortness of breath.  Cardiovascular: Positive for leg swelling. Negative for chest pain.  Gastrointestinal: Negative.  Genitourinary: Negative.  Musculoskeletal: Negative.  Skin: Negative.  Neurological: Negative.  Endo/Heme/Allergies: Bruises/bleeds easily.  Psychiatric/Behavioral: Negative.    Medical History: Past Medical History:  Diagnosis Date   Arthritis   Asthma, unspecified asthma severity, unspecified whether complicated, unspecified whether persistent   CHF (congestive heart failure) (CMS-HCC)   Chronic kidney disease   COPD (chronic obstructive pulmonary  disease) (CMS-HCC)   History of cancer   History of stroke   Liver disease   Patient Active Problem List  Diagnosis   Malignant neoplasm of upper-inner quadrant of left female breast (CMS-HCC)   Past Surgical History:  Procedure Laterality Date   CYSTOSCOPY   rigth breast lumpectomy Right    Allergies  Allergen Reactions   Iodinated Contrast Media Hives   Iohexol Other (See Comments)  Code: HIVES, Desc: hives w/ contrast '08// better w/ benadryl   Sulfamethoxazole-Trimethoprim Other (See Comments)  REACTION: unknown   Current Outpatient Medications on File Prior to Visit  Medication Sig Dispense Refill   acetaminophen (TYLENOL) 650 MG ER tablet Take by mouth   amLODIPine (NORVASC) 10 MG tablet Take 10 mg by mouth once daily   docosahexaenoic acid-epa 120-180 mg Cap Take 1 capsule by mouth once daily   flaxseed oiL Oil Take 1 tablet by mouth at bedtime.   FUROsemide (LASIX) 20 MG tablet Take 20 mg by mouth 2 (two) times daily   garlic 9,485 mg Cap Take 1,000 mg by mouth 2 (two) times daily   ketoconazole (NIZORAL) 2 % cream Apply topically   metoprolol tartrate (LOPRESSOR) 100 MG tablet Take 1.5 tablets by mouth 2 (two) times daily   nystatin (MYCOSTATIN) 100,000 unit/gram powder Apply 1 Application topically 3 (three) times daily   simvastatin (ZOCOR) 10 MG tablet TAKE 1 TABLET BY MOUTH ONCE DAILY AT 6PM   warfarin (COUMADIN) 2.5 MG tablet TAKE 1 TABLET BY MOUTH ONCE DAILY EXCEPT 1/2 (ONE-HALF) TABLET ON FRIDAYS   No current facility-administered medications on file prior to visit.  Family History  Problem Relation Age of Onset   Obesity Father   High blood pressure (Hypertension) Sister   Hyperlipidemia (Elevated cholesterol) Sister   Heart valve disease Sister   Deep vein thrombosis (DVT or abnormal blood clot formation) Sister   Colon cancer Sister   Breast cancer Sister   Diabetes Brother   Hyperlipidemia (Elevated cholesterol) Brother   Obesity Brother     Social History   Tobacco Use  Smoking Status Former Smoker  Smokeless Tobacco Never Used    Social History   Socioeconomic History   Marital status: Widowed  Tobacco Use   Smoking status: Former Smoker   Smokeless tobacco: Never Used  Substance and Sexual Activity   Alcohol use: Never   Drug use: Never   Objective:   There were no vitals filed for this visit.  There is no height or weight on file to calculate BMI.  Physical Exam Constitutional:  Appearance: Normal appearance. She is obese.  HENT:  Head: Normocephalic and atraumatic.  Right Ear: External ear normal.  Left Ear: External ear normal.  Nose: Nose normal.  Mouth/Throat:  Mouth: Mucous membranes are moist.  Pharynx: Oropharynx is clear.  Eyes:  General: No scleral icterus. Extraocular Movements: Extraocular movements intact.  Conjunctiva/sclera: Conjunctivae normal.  Pupils: Pupils are equal, round, and reactive to light.  Cardiovascular:  Rate and Rhythm: Normal rate and regular rhythm.  Pulses: Normal pulses.  Heart sounds: Normal heart sounds.  Pulmonary:  Effort: Respiratory distress present.  Comments: Breath sounds are clear but there is significant increased work of breathing Abdominal:  General: Bowel sounds are normal.  Palpations: Abdomen is soft.  Tenderness: There is no abdominal tenderness.  Musculoskeletal:  General: Normal range of motion.  Cervical back: Normal range of motion and neck supple. No tenderness.  Right lower leg: Edema present.  Left lower leg: Edema present.  Comments: There is significant pitting edema of the lower extremities to the knee  Skin: General: Skin is warm and dry.  Coloration: Skin is not jaundiced.  Neurological:  General: No focal deficit present.  Mental Status: She is alert and oriented to person, place, and time.  Psychiatric:  Mood and Affect: Mood normal.  Behavior: Behavior normal.     Labs, Imaging and Diagnostic  Testing:  Assessment and Plan:  Diagnoses and all orders for this visit:  Malignant neoplasm of upper-inner quadrant of left female breast, unspecified estrogen receptor status (CMS-HCC)    The patient appears to have a 1.8 cm cancer in the medial aspect of the left breast with clinically negative nodes. At this point I would recommend breast conservation to remove the cancer and she would not likely need a node evaluation given her age. Unfortunately she also has lung cancer and metastatic colon cancer and has significant difficulty with breathing at her baseline. I am skeptical about whether she could tolerate surgery. We will obtain cardiac clearance from Dr. Alba Cory and she would need to be off Coumadin for several days prior to surgery. We will also obtain pulmonary clearance from Dr. Earlie Server. We will call her with the results of her tumor markers. If her cancer is positive for estrogen and progesterone receptors she may do better with antiestrogen therapy. We will refer her to oncology to help [place] decide on the most appropriate therapy for her at this point.

## 2021-09-11 NOTE — Discharge Instructions (Signed)
  Post Anesthesia Home Care Instructions  Activity: Get plenty of rest for the remainder of the day. A responsible individual must stay with you for 24 hours following the procedure.  For the next 24 hours, DO NOT: -Drive a car -Paediatric nurse -Drink alcoholic beverages -Take any medication unless instructed by your physician -Make any legal decisions or sign important papers.  Meals: Start with liquid foods such as gelatin or soup. Progress to regular foods as tolerated. Avoid greasy, spicy, heavy foods. If nausea and/or vomiting occur, drink only clear liquids until the nausea and/or vomiting subsides. Call your physician if vomiting continues.  Special Instructions/Symptoms: Your throat may feel dry or sore from the anesthesia or the breathing tube placed in your throat during surgery. If this causes discomfort, gargle with warm salt water. The discomfort should disappear within 24 hours.  If you had a scopolamine patch placed behind your ear for the management of post- operative nausea and/or vomiting:  1. The medication in the patch is effective for 72 hours, after which it should be removed.  Wrap patch in a tissue and discard in the trash. Wash hands thoroughly with soap and water. 2. You may remove the patch earlier than 72 hours if you experience unpleasant side effects which may include dry mouth, dizziness or visual disturbances. 3. Avoid touching the patch. Wash your hands with soap and water after contact with the patch.         Call your surgeon if you experience:   1.  Fever over 101.0. 2.  Inability to urinate. 3.  Nausea and/or vomiting. 4.  Extreme swelling or bruising at the surgical site. 5.  Continued bleeding from the incision. 6.  Increased pain, redness or drainage from the incision. 7.  Problems related to your pain medication. 8.  Any problems and/or concerns

## 2021-09-11 NOTE — Anesthesia Preprocedure Evaluation (Addendum)
Anesthesia Evaluation  Patient identified by MRN, date of birth, ID band Patient awake    Reviewed: Allergy & Precautions, NPO status , Patient's Chart, lab work & pertinent test results  Airway Mallampati: II  TM Distance: >3 FB Neck ROM: Full    Dental  (+) Partial Upper   Pulmonary asthma , COPD (2L overnight),  COPD inhaler and oxygen dependent, former smoker,    Pulmonary exam normal        Cardiovascular hypertension, Pt. on medications and Pt. on home beta blockers +CHF and + DOE  + dysrhythmias (on Coumadin) Atrial Fibrillation  Rhythm:Irregular Rate:Normal     Neuro/Psych CVA (cognitive issues), Residual Symptoms negative psych ROS   GI/Hepatic Neg liver ROS, GERD  ,  Endo/Other  negative endocrine ROS  Renal/GU   negative genitourinary   Musculoskeletal Left breast cancer    Abdominal (+)  Abdomen: soft.    Peds  Hematology  (+) anemia ,   Anesthesia Other Findings   Reproductive/Obstetrics                            Anesthesia Physical Anesthesia Plan  ASA: 3  Anesthesia Plan: General   Post-op Pain Management:    Induction: Intravenous  PONV Risk Score and Plan: 3 and Ondansetron, Dexamethasone and Treatment may vary due to age or medical condition  Airway Management Planned: Mask and LMA  Additional Equipment: None  Intra-op Plan:   Post-operative Plan: Extubation in OR  Informed Consent: I have reviewed the patients History and Physical, chart, labs and discussed the procedure including the risks, benefits and alternatives for the proposed anesthesia with the patient or authorized representative who has indicated his/her understanding and acceptance.     Dental advisory given  Plan Discussed with: CRNA  Anesthesia Plan Comments:         Anesthesia Quick Evaluation

## 2021-09-11 NOTE — Op Note (Signed)
09/11/2021  1:50 PM  PATIENT:  Tammie [name]  80 y.o. female  PRE-OPERATIVE DIAGNOSIS:  LEFT BREAST CANCER  POST-OPERATIVE DIAGNOSIS:  left breast cancer  PROCEDURE:  Procedure(s): LEFT BREAST LUMPECTOMY (Left)  SURGEON:  Surgeon(s) and Role:    * Jovita Kussmaul, MD - Primary  PHYSICIAN ASSISTANT:   ASSISTANTS: none   ANESTHESIA:   local and general  EBL:  minimal   BLOOD ADMINISTERED:none  DRAINS: none   LOCAL MEDICATIONS USED:  MARCAINE     SPECIMEN:  Source of Specimen:  left breast tissue  DISPOSITION OF SPECIMEN:  PATHOLOGY  COUNTS:  YES  TOURNIQUET:  * No tourniquets in log *  DICTATION: .Dragon Dictation  After informed consent was obtained the patient was brought to the operating room and placed in the supine position on the operating table.  After adequate induction of general anesthesia the patient's left breast was prepped with ChloraPrep, allowed to dry, and draped in usual sterile manner.  An appropriate timeout was performed.  The patient had a palpable cancer in the medial aspect of the left breast.  The area around this was infiltrated with quarter percent Marcaine.  An elliptical incision was made in the skin overlying the palpable mass.  The incision was carried through the skin and subcutaneous tissue sharply with the electrocautery.  Dissection was then carried widely around the palpable mass until the dissection reached the chest wall.  Once the mass was removed it was oriented with the appropriate paint colors.  A specimen radiograph was obtained that showed the clip to be near the center of the specimen.  The specimen was then sent to pathology for further evaluation.  Hemostasis was achieved using the Bovie electrocautery.  The wound was irrigated with saline and infiltrated with more quarter percent Marcaine.  The deep layer of the wound was then closed with layers of interrupted 3-0 Vicryl stitches.  The skin was closed with a running 4-0 Monocryl  subcuticular stitch.  Dermabond dressings were applied.  The patient tolerated the procedure well.  At the end of the case all needle sponge and instrument counts were correct.  The patient was then awakened and taken recovery in stable condition.  PLAN OF CARE: Discharge to home after PACU  PATIENT DISPOSITION:  PACU - hemodynamically stable.   Delay start of Pharmacological VTE agent (>24hrs) due to surgical blood loss or risk of bleeding: not applicable

## 2021-09-11 NOTE — Anesthesia Postprocedure Evaluation (Signed)
Anesthesia Post Note  Patient: Tammie [name]  Procedure(s) Performed: LEFT BREAST LUMPECTOMY (Left: Breast)     Patient location during evaluation: PACU Anesthesia Type: General Level of consciousness: awake and alert Pain management: pain level controlled Vital Signs Assessment: post-procedure vital signs reviewed and stable Respiratory status: spontaneous breathing, nonlabored ventilation, respiratory function stable and patient connected to nasal cannula oxygen Cardiovascular status: blood pressure returned to baseline and stable Postop Assessment: no apparent nausea or vomiting Anesthetic complications: no   No notable events documented.  Last Vitals:  Vitals:   09/11/21 1430 09/11/21 1442  BP: (!) 149/82 (!) 142/68  Pulse: 68 70  Resp: 17   Temp:  36.7 C  SpO2: 90% 90%    Last Pain:  Vitals:   09/11/21 1442  TempSrc:   PainSc: 0-No pain                 Belenda Cruise P Akiah Bauch

## 2021-09-11 NOTE — Anesthesia Procedure Notes (Signed)
Procedure Name: LMA Insertion Date/Time: 09/11/2021 1:28 PM Performed by: Signe Colt, CRNA Pre-anesthesia Checklist: Patient identified, Emergency Drugs available, Suction available and Patient being monitored Patient Re-evaluated:Patient Re-evaluated prior to induction Oxygen Delivery Method: Circle System Utilized Preoxygenation: Pre-oxygenation with 100% oxygen Induction Type: IV induction Ventilation: Mask ventilation without difficulty LMA: LMA inserted LMA Size: 4.0 Number of attempts: 1 Airway Equipment and Method: bite block Placement Confirmation: positive ETCO2 Tube secured with: Tape Dental Injury: Teeth and Oropharynx as per pre-operative assessment

## 2021-09-14 ENCOUNTER — Encounter (HOSPITAL_BASED_OUTPATIENT_CLINIC_OR_DEPARTMENT_OTHER): Payer: Self-pay | Admitting: General Surgery

## 2021-09-14 LAB — SURGICAL PATHOLOGY

## 2021-09-15 ENCOUNTER — Encounter: Payer: Self-pay | Admitting: *Deleted

## 2021-09-15 ENCOUNTER — Ambulatory Visit: Payer: HMO | Admitting: Pharmacist

## 2021-09-15 ENCOUNTER — Telehealth: Payer: Self-pay | Admitting: *Deleted

## 2021-09-15 DIAGNOSIS — I4891 Unspecified atrial fibrillation: Secondary | ICD-10-CM

## 2021-09-15 NOTE — Progress Notes (Signed)
Chronic Care Management Pharmacy Note  09/15/2021 Name:  Tammie [name] MRN:  [id number] DOB:  05-02-1941  Summary: AFib  Recommendations/Changes made from today's visit: Atrial Fibrillation: Controlled; current rate/rhythm control- METOPROLOL; anticoagulant treatment: WARFARIN CHADS2VASc score: 6 Home blood pressure, heart rate readings:  Recommended LOVENOX BRIDGE for sugery--procedure was on 09/11/21; patient stable, denies signs and symptoms of bleeding    Decision was made to bridge based on CHADSVAsc score of 6 and past use of enoxaparin bridging prior to previous procedures Patient notified of plan and copy of below plan left for her to pick up    [place] for Tammie [name] Weight = 124kg Est Cr Clearance is 16m/min - therefore will adjust dose from q12 h to q24h Diagnosis - Atrial Fibrillation; TIA; active cancer Current warfarin dose = 5422mtablets - take  tablet daily except none on Fridays    MONDAY, September 19th  Take 1 and 1/2 tablets or 7.22m27mf warfarin Lovenox / enoxaparin 120m92mce a day  TUESDAY, September 20th Take 1 and 1/2 tablets or 7.22mg 7mwarfarin Lovenox / enoxaparin 120mg 20m a day INR 1.3 today  [facility] 22mg wa77mrin Lovenox / enoxaparin 120mg on76m day   THURSDAY, September 22nd Take 22mg [id number] Report back INR  FRIDAY, September 23rd       Follow Up Plan: Telephone follow up appointment with care management team member scheduled for: 09/18/21   Subjective: Tammie M GaANASTAZJA [name] y.o. y25r old female who is a primary patient of Hawks, ChSharion Balloonhe CCM team was consulted for assistance with disease management and care coordination needs.    Engaged with patient by telephone for follow up visit in response to provider referral for pharmacy case management and/or care coordination services.   Consent to Services:  The patient was given information about Chronic Care Management services, agreed  to services, and gave verbal consent prior to initiation of services.  Please see initial visit note for detailed documentation.   Patient Care Team: Hawks, ChSharion BalloonPCP - General (Family Medicine) Hochrein,Minus BreedingCP - Cardiology (Cardiology) Grapey, DRana Snarective) as Consulting Physician (Urology) Sanford, Rexene Agentttending Physician (Nephrology) [name], Curt Bearsonsulting Physician (Oncology) WentworthThea Silversmithonsulting Physician (Radiation Oncology) Lupton, FDruscilla Brownieonsulting Physician (Dermatology) Toth, PauJovita Kussmaulonsulting Physician (General Surgery) HendricksMelrose Nakayamaonsulting Physician (Cardiothoracic Surgery) Martin, DOkey RegalonWinter Beachlting Physician (Optometry) Stuart, DMauro Kaufmannncology Nurse Navigator Martini, Rockwell Germanyncology Nurse Navigator   Objective:  Lab Results  Component Value Date   CREATININE 1.51 (H) 09/10/2021   CREATININE 1.79 (H) 08/04/2021   CREATININE 1.60 (H) 07/06/2021    Lab Results  Component Value Date   HGBA1C 5.4 07/26/2016   Last diabetic Eye exam: No results found for: HMDIABEYEEXA  Last diabetic Foot exam: No results found for: HMDIABFOOTEX      Component Value Date/Time   CHOL 142 01/22/2019 1222   TRIG 199 (H) 01/22/2019 1222   HDL 44 01/22/2019 1222   CHOLHDL 3.2 01/22/2019 1222   LDLCALC 5[place]/2020 1222    Hepatic Function Latest Ref Rng & Units 08/04/2021 02/02/2021 12/29/2020  Total Protein 6.5 - 8.1 g/dL 7.1 7.2 7.1  Albumin 3.5 - 5.0 g/dL 3.5 3.9 4.5  AST 15 - 41 U/L _0 ALT 0 - 44  U/L _0 Alk Phosphatase 38 - 126 U/L 51 57 48  Total Bilirubin 0.3 - 1.2 mg/dL 0.4 0.4 0.4  Bilirubin, Direct 0.01 - 0.4 mg/dL - - -    Lab Results  Component Value Date/Time   TSH 2.275 03/22/2019 12:44 PM   TSH 3.194 12/21/2018 10:14 AM   TSH 3.443 12/22/2017 10:13 AM   TSH 4.019 (H) 11/10/2017 11:29 AM   TSH 3.970 07/26/2016 12:03 PM   TSH  1.223 03/09/2012 06:40 AM    CBC Latest Ref Rng & Units 08/04/2021 02/02/2021 12/29/2020  WBC 4.0 - 10.5 K/uL 6.0 6.3 6.7  Hemoglobin 12.0 - 15.0 g/dL 12.9 14.4 15.5  Hematocrit 36.0 - 46.0 % 40.5 44.8 47.9(H)  Platelets 150 - 400 K/uL 183 150 165    No results found for: VD25OH  Clinical ASCVD: Yes  The ASCVD Risk score (Arnett DK, et al., 2019) failed to calculate for the following reasons:   The 2019 ASCVD risk score is only valid for ages 75 to 66   The patient has a prior MI or stroke diagnosis    Other: (CHADS2VASc if Afib, PHQ9 if depression, MMRC or CAT for COPD, ACT, DEXA)  Social History   Tobacco Use  Smoking Status Former   Packs/day: 3.00   Years: 32.00   Pack years: 96.00   Types: Cigarettes   Start date: 12/27/1989   Quit date: 03/08/1990   Years since quitting: 31.5  Smokeless Tobacco Former   Quit date: 03/08/1990  Tobacco Comments   smoked 3ppd from 1959-1991    BP Readings from Last 3 Encounters:  09/11/21 (!) 142/68  08/24/21 (!) 148/74  08/06/21 (!) 143/82   Pulse Readings from Last 3 Encounters:  09/11/21 70  08/24/21 (!) 57  08/06/21 64   Wt Readings from Last 3 Encounters:  09/11/21 269 lb 6.4 oz (122.2 kg)  08/24/21 266 lb 12.8 oz (121 kg)  08/06/21 271 lb 8 oz (123.2 kg)    Assessment: Review of patient past medical history, allergies, medications, health status, including review of consultants reports, laboratory and other test data, was performed as part of comprehensive evaluation and provision of chronic care management services.   SDOH:  (Social Determinants of Health) assessments and interventions performed:    CCM Care Plan  Allergies  Allergen Reactions   Tape Hives   Contrast Media [Iodinated Diagnostic Agents] Hives   Iohexol Other (See Comments)     Code: HIVES, Desc: hives w/ contrast '08// better w/ benadryl    Sulfa Antibiotics Other (See Comments)    REACTION: unknown   Sulfamethoxazole-Trimethoprim Other (See Comments)     REACTION: unknown    Medications Reviewed Today     Reviewed by Valentino Saxon, RN (Registered Nurse) on 09/11/21 at 28  Med List Status: <None>   Medication Order Taking? Sig Documenting Provider Last Dose Status Informant  acetaminophen (TYLENOL) 650 MG CR tablet [id number] Yes Take 650 mg by mouth 3 (three) times daily as needed for pain.  [provider] Past Week Active Self           Med Note Dorrene German, NICOLE L   Fri Jun 27, 2015  9:23 AM) .   albuterol (PROAIR HFA) 108 (90 Base) MCG/ACT inhaler [id number] Yes Inhale 2 puffs into the lungs every 6 (six) hours as needed for wheezing or shortness of breath. [name], [place] 09/11/2021 Active   amLODipine (NORVASC) 10 MG tablet [id number] Yes Take 1 tablet (  10 mg total) by mouth daily. [name], [place] 09/10/2021 Active   beclomethasone (QVAR) 40 MCG/ACT inhaler [id number] No Inhale 1 puff into the lungs 2 (two) times daily. [provider] Unknown Active Self  diphenhydrAMINE (BENADRYL) 25 MG tablet [id number] Yes Take 25 mg by mouth every 6 (six) hours as needed for allergies. [provider] Past Month Active Self  enoxaparin (LOVENOX) 120 MG/0.8ML injection [id number] Yes Inject 0.8 mLs (120 mg total) into the skin daily. Begin on 09/06/21 and stop after the morning dose on 09/09/21. RESUME on 09/12/21 after surgery & follow up with pcp for future instructions [name], FNP Past Week Active   Ferrous Sulfate (IRON) 142 (45 Fe) MG TBCR [id number] Yes Take 1 tablet by mouth daily. [provider] Past Week Active Self  Flaxseed, Linseed, (FLAXSEED OIL PO) [id number] Yes Take 1 tablet by mouth at bedtime. [provider] Past Week Active Self  furosemide (LASIX) 20 MG tablet [id number] Yes Take 1 tablet (20 mg total) by mouth 2 (two) times daily.  Patient taking differently: Take 20 mg by mouth 2 (two) times daily. Only takes one daily   [name], FNP Past Week Active   Garlic  8466 MG CAPS [id number] Yes Take 1,000 mg by mouth 2 (two) times daily. [provider] Past Week Active Self  ketoconazole (NIZORAL) 2 % cream [id number] No Apply 1 application topically daily. [name] A, FNP Unknown Active   letrozole [facility]) 2.5 MG tablet [id number] Yes Take 1 tablet (2.5 mg total) by mouth daily. Nicholas Lose, MD 09/10/2021 Active   metoprolol tartrate (LOPRESSOR) 100 MG tablet [id number] Yes TAKE 1 & 1/2 (ONE & ONE-HALF) TABLETS BY MOUTH TWICE DAILY [name], FNP 09/11/2021 0830 Active   nystatin (MYCOSTATIN/NYSTOP) powder [id number] Yes Apply 1 application topically 3 (three) times daily. [name], [place] 09/10/2021 Active   Omega-3 Fatty Acids (FISH OIL) 1000 MG CAPS [id number] Yes Take 1 capsule by mouth daily. [provider] Past Week Active Self  simvastatin (ZOCOR) 10 MG tablet [id number] Yes TAKE 1 TABLET BY MOUTH ONCE DAILY AT  [address] [place] 09/10/2021 Active   warfarin (COUMADIN) 2.5 MG tablet [id number] No TAKE ONE TABLET BY MOUTH ONCE DAILY EXCEPT 1/2 TABLET ON FRIDAYS. [name], [place] 09/05/2021 Active             Patient Active Problem List   Diagnosis Date Noted   Encounter for therapeutic drug monitoring 08/06/2021   Warfarin anticoagulation 10/06/2020   Acute on chronic diastolic CHF (congestive heart failure) ([place]) 09/14/2020   Acute respiratory failure with hypoxia ([place]) 10/15/2019   Port-A-Cath in place 05/18/2018   Genetic testing 07/13/2017   Primary adenocarcinoma of colon ([place]) 07/07/2017   Encounter for antineoplastic immunotherapy 12/17/2016   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 ([place])    Cancer with unknown primary site [facility])    Liver mass 10/10/2016   Neuropathy 07/26/2016   Antineoplastic chemotherapy induced anemia 03/24/2015   CKD (chronic kidney disease), stage III ([place]) 03/24/2015   Hyperlipidemia with target LDL less than 100 03/10/2015   Peripheral edema 03/10/2015   Malignant  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 ([place]) 08/29/2014   Malignant neoplasm of upper-inner quadrant of left breast in female, estrogen receptor positive ([place]) 11/13/2013   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   HTN (hypertension) 03/13/2012   Atrial fibrillation ([place]) 03/08/2012   Asthma 04/17/2011   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  DOE (dyspnea on exertion) 09/04/2010   CVA 05/21/2009    Immunization History  Administered Date(s) Administered   Fluad Quad(high Dose 65+) 10/12/2019, 09/29/2020   Influenza Whole 09/26/2009, 09/26/2010   Influenza, High Dose Seasonal PF 09/27/2016, 09/29/2018   Influenza,inj,Quad PF,6+ Mos 10/29/2013, 09/10/2014, 11/24/2015, 09/23/2017   Moderna Sars-Covid-2 Vaccination 02/21/2020, 03/21/2020   PFIZER Comirnaty([place])Covid-19 Tri-Sucrose Vaccine 02/04/2021   Pneumococcal Conjugate-13 06/16/2012   Pneumococcal Polysaccharide-23 09/26/2008   Tdap 07/20/2012   Zoster Recombinat (Shingrix) 08/24/2021    Conditions to be addressed/monitored: Atrial Fibrillation  Care Plan : PHARMD MEDICATION MANAGEMENT  Updates made by Lavera Guise, [place] since 09/16/2021 12:00 AM     Problem: DISEASE PROGRESSION PREVENTION      Long-Range Goal: ATRIAL FIBRILLATION   Recent Progress: On track  Priority: High  Note:   Current Barriers:  Unable to independently monitor therapeutic efficacy Unable to maintain control of INR-BRIDGE FOR post-SURGERY  Pharmacist Clinical Goal(s):  Over the next 90 days, patient will achieve adherence to monitoring guidelines and medication adherence to achieve therapeutic efficacy achieve control of ANTICOAGULATION as evidenced by GOAL INR--BRIDGE LOVENOX FOR post-SURGERY  through collaboration with PharmD and provider.    Interventions: 1:1 collaboration with [name], FNP regarding development and update of comprehensive plan of care as evidenced by provider attestation and co-signature Inter-disciplinary care  team collaboration (see longitudinal plan of care) Comprehensive medication review performed; medication list updated in electronic medical record  Atrial Fibrillation: Controlled; current rate/rhythm control- METOPROLOL; anticoagulant treatment: WARFARIN CHADS2VASc score: 6 Home blood pressure, heart rate readings:  Recommended LOVENOX BRIDGE for sugery--procedure was on 09/11/21; patient stable, denies signs and symptoms of bleeding    Decision was made to bridge based on CHADSVAsc score of 6 and past use of enoxaparin bridging prior to previous procedures Patient notified of plan and copy of below plan left for her to pick up    [place] for Tammie [name] Weight = 124kg Est Cr Clearance is 79m/min - therefore will adjust dose from q12 h to q24h Diagnosis - Atrial Fibrillation; TIA; active cancer Current warfarin dose = 517mtablets - take  tablet daily except none on Fridays    MONDAY, September 19th  Take 1 and 1/2 tablets or 7.33m36mf warfarin Lovenox / enoxaparin 120m67mce a day  TUESDAY, September 20th Take 1 and 1/2 tablets or 7.33mg 59mwarfarin Lovenox / enoxaparin 120mg 33m a day INR 1.3 today  [facility] Take 33mg wa30mrin Lovenox / enoxaparin 120mg on16m day   THURSDAY, September 22nd Take 33mg warf53mn Report back INR  FRIDAY, September 23rd        Patient Goals/Self-Care Activities Over the next 90 days, patient will:  - take medications as prescribed INRs weekly at home  Follow Up Plan: Telephone follow up appointment with care management team member scheduled for: 09/18/21      Medication Assistance: None required.  Patient affirms current coverage meets needs.  [phone] [phone] Up:  Patient agrees to Care Plan and Follow-up.  Plan: Telephone follow up appointment with care management team member scheduled for:  9/22-23  Julie DatRegina Eck BCPS Clinical  Pharmacist, [place]  II Phone [phone]

## 2021-09-15 NOTE — Telephone Encounter (Signed)
Patient aware and verbalized understanding. °

## 2021-09-15 NOTE — Telephone Encounter (Signed)
Description   INR  1.3 (at goal) Goal-2-3  Take 5 mg today, (09/15/21) then resume current dose of  2.5 mg daily.  Recheck in [place].

## 2021-09-15 NOTE — Telephone Encounter (Signed)
Fax received mdINR PT/INR self testing service Test date/time 09/15/21 1114 am INR 1.3

## 2021-09-16 NOTE — Patient Instructions (Signed)
Visit Information  PATIENT GOALS:  Goals Addressed               This Visit's Progress     Patient Stated     AFIB-PHARMD (pt-stated)        Current Barriers:  Unable to independently monitor therapeutic efficacy Unable to maintain control of INR-BRIDGE FOR post-SURGERY  Pharmacist Clinical Goal(s):  Over the next 90 days, patient will achieve adherence to monitoring guidelines and medication adherence to achieve therapeutic efficacy achieve control of ANTICOAGULATION as evidenced by GOAL INR--BRIDGE LOVENOX FOR post-SURGERY through collaboration with PharmD and provider.    Interventions: 1:1 collaboration with [name], FNP regarding development and update of comprehensive plan of care as evidenced by provider attestation and co-signature Inter-disciplinary care team collaboration (see longitudinal plan of care) Comprehensive medication review performed; medication list updated in electronic medical record  Atrial Fibrillation: Controlled; current rate/rhythm control- METOPROLOL; anticoagulant treatment: WARFARIN CHADS2VASc score: 6 Home blood pressure, heart rate readings:  Recommended LOVENOX BRIDGE for sugery--procedure was on 09/11/21; patient stable, denies signs and symptoms of bleeding    Decision was made to bridge based on CHADSVAsc score of 6 and past use of enoxaparin bridging prior to previous procedures Patient notified of plan and copy of below plan left for her to pick up    [place] for Ulyana Pitones Weight = 124kg Est Cr Clearance is 58mL/min - therefore will adjust dose from q12 h to q24h Diagnosis - Atrial Fibrillation; TIA; active cancer Current warfarin dose = 5mg  tablets - take  tablet daily except none on Fridays    MONDAY, September 19th  Take 1 and 1/2 tablets or 7.5mg  of warfarin Lovenox / enoxaparin 120mg  once a day  TUESDAY, September 20th Take 1 and 1/2 tablets or 7.5mg  of warfarin Lovenox / enoxaparin 120mg  once a  day INR 1.3 today  [facility], September 21st Take 5mg  warfarin Lovenox / enoxaparin 120mg  once a day   THURSDAY, September 22nd Take 5mg  warfarin Report back INR  FRIDAY, September 23rd        Patient Goals/Self-Care Activities Over the next 90 days, patient will:  - take medications as prescribed INRs weekly at home  Follow Up Plan: Telephone follow up appointment with care management team member scheduled for: 09/18/21         The patient verbalized understanding of instructions, educational materials, and care plan provided today and declined offer to receive copy of patient instructions, educational materials, and care plan.   Telephone follow up appointment with care management team member scheduled for:09/17/21  Signature Regina Eck, PharmD, BCPS Clinical Pharmacist, [place]  II Phone [phone]

## 2021-09-17 ENCOUNTER — Inpatient Hospital Stay: Payer: HMO | Attending: Hematology and Oncology | Admitting: Internal Medicine

## 2021-09-17 ENCOUNTER — Other Ambulatory Visit: Payer: Self-pay

## 2021-09-17 VITALS — BP 153/72 | HR 62 | Temp 97.9°F | Resp 18 | Wt 264.1 lb

## 2021-09-17 DIAGNOSIS — Z8673 Personal history of transient ischemic attack (TIA), and cerebral infarction without residual deficits: Secondary | ICD-10-CM | POA: Insufficient documentation

## 2021-09-17 DIAGNOSIS — C50212 Malignant neoplasm of upper-inner quadrant of left female breast: Secondary | ICD-10-CM | POA: Insufficient documentation

## 2021-09-17 DIAGNOSIS — I10 Essential (primary) hypertension: Secondary | ICD-10-CM | POA: Insufficient documentation

## 2021-09-17 DIAGNOSIS — Z923 Personal history of irradiation: Secondary | ICD-10-CM | POA: Diagnosis not present

## 2021-09-17 DIAGNOSIS — C229 Malignant neoplasm of liver, not specified as primary or secondary: Secondary | ICD-10-CM

## 2021-09-17 DIAGNOSIS — Z9221 Personal history of antineoplastic chemotherapy: Secondary | ICD-10-CM | POA: Insufficient documentation

## 2021-09-17 DIAGNOSIS — C189 Malignant neoplasm of colon, unspecified: Secondary | ICD-10-CM

## 2021-09-17 DIAGNOSIS — Z87891 Personal history of nicotine dependence: Secondary | ICD-10-CM | POA: Insufficient documentation

## 2021-09-17 DIAGNOSIS — Z170 Estrogen receptor positive status [ER+]: Secondary | ICD-10-CM | POA: Diagnosis present

## 2021-09-17 DIAGNOSIS — Z85118 Personal history of other malignant neoplasm of bronchus and lung: Secondary | ICD-10-CM | POA: Diagnosis not present

## 2021-09-17 DIAGNOSIS — Z853 Personal history of malignant neoplasm of breast: Secondary | ICD-10-CM | POA: Insufficient documentation

## 2021-09-17 DIAGNOSIS — Z85038 Personal history of other malignant neoplasm of large intestine: Secondary | ICD-10-CM | POA: Diagnosis not present

## 2021-09-17 DIAGNOSIS — Z5181 Encounter for therapeutic drug level monitoring: Secondary | ICD-10-CM

## 2021-09-17 DIAGNOSIS — C3431 Malignant neoplasm of lower lobe, right bronchus or lung: Secondary | ICD-10-CM | POA: Diagnosis not present
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
## 2021-09-18 ENCOUNTER — Ambulatory Visit (INDEPENDENT_AMBULATORY_CARE_PROVIDER_SITE_OTHER): Payer: HMO

## 2021-09-18 ENCOUNTER — Ambulatory Visit: Payer: HMO | Admitting: Pharmacist

## 2021-09-18 ENCOUNTER — Telehealth: Payer: Self-pay | Admitting: Family

## 2021-09-18 ENCOUNTER — Ambulatory Visit: Payer: HMO | Admitting: Hematology and Oncology

## 2021-09-18 VITALS — Wt 270.0 lb

## 2021-09-18 DIAGNOSIS — Z0000 Encounter for general adult medical examination without abnormal findings: Secondary | ICD-10-CM | POA: Diagnosis not present

## 2021-09-18 DIAGNOSIS — I4891 Unspecified atrial fibrillation: Secondary | ICD-10-CM
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
## 2021-09-18 NOTE — Patient Instructions (Signed)
Visit Information  PATIENT GOALS:  Goals Addressed               This Visit's Progress     Patient Stated     AFIB-PHARMD (pt-stated)        Current Barriers:  Unable to independently monitor therapeutic efficacy Unable to maintain control of INR-BRIDGE FOR post-SURGERY  Pharmacist Clinical Goal(s):  Over the next 90 days, patient will achieve adherence to monitoring guidelines and medication adherence to achieve therapeutic efficacy achieve control of ANTICOAGULATION as evidenced by GOAL INR--BRIDGE LOVENOX FOR post-SURGERY through collaboration with PharmD and provider.    Interventions: 1:1 collaboration with [name], FNP regarding development and update of comprehensive plan of care as evidenced by provider attestation and co-signature Inter-disciplinary care team collaboration (see longitudinal plan of care) Comprehensive medication review performed; medication list updated in electronic medical record  Atrial Fibrillation: Controlled; current rate/rhythm control- METOPROLOL; anticoagulant treatment: WARFARIN TAKE 5 MG TONIGHT THEN RESUME HOME DOSE--WILL FOLLOW UP ON TUESDAY CHADS2VASc score: 6 Home blood pressure, heart rate readings:    Patient Goals/Self-Care Activities Over the next 90 days, patient will:  - take medications as prescribed INRs weekly at home  Follow Up Plan: Telephone follow up appointment with care management team member scheduled for: 09/22/21         The patient verbalized understanding of instructions, educational materials, and care plan provided today and declined offer to receive copy of patient instructions, educational materials, and care plan.   Telephone follow up appointment with care management team member scheduled for: 9/27  Signature\ Regina Eck, PharmD, BCPS Clinical Pharmacist, [place]  II Phone [phone]

## 2021-09-18 NOTE — Patient Instructions (Signed)
Tammie [name] , Thank you for taking time to come for your Medicare Wellness Visit. I appreciate your ongoing commitment to your health goals. Please review the following plan we discussed and let me know if I can assist you in the future.   Screening recommendations/referrals: Colonoscopy: Done 12/07/2016 - no repeat required Mammogram: Done 06/30/2021 - follow up as needed Bone Density: Done 06/11/2015 - Gita Kudo in 5 years  Recommended yearly ophthalmology/optometry visit for glaucoma screening and checkup Recommended yearly dental visit for hygiene and checkup  Vaccinations: Influenza vaccine: Done 09/29/2020 - Repeat annually Pneumococcal vaccine: Done 09/26/2008 & 06/16/2012 Tdap vaccine: Done 07/20/2012 - Repeat in 10 years Shingles vaccine: First dose done 08/24/2021 - get second dose at next appointment   Covid-19:Done 02/21/20, 03/21/20, 02/04/2021  Conditions/risks identified: Aim for 30 minutes of exercise or brisk walking each day, drink 6-8 glasses of water and eat lots of fruits and vegetables.   Next appointment: Follow up in one year for your annual wellness visit    Preventive Care 65 Years and Older, Female Preventive care refers to lifestyle choices and visits with your health care provider that can promote health and wellness. What does preventive care include? A yearly physical exam. This is also called an annual well check. Dental exams once or twice a year. Routine eye exams. Ask your health care provider how often you should have your eyes checked. Personal lifestyle choices, including: Daily care of your teeth and gums. Regular physical activity. Eating a healthy diet. Avoiding tobacco and drug use. Limiting alcohol use. Practicing safe sex. Taking low-dose aspirin every day. Taking vitamin and mineral supplements as recommended by your health care provider. What happens during an annual well check? The services and screenings done by your health care provider during  your annual well check will depend on your age, overall health, lifestyle risk factors, and family history of disease. Counseling  Your health care provider may ask you questions about your: Alcohol use. Tobacco use. Drug use. Emotional well-being. Home and relationship well-being. Sexual activity. Eating habits. History of falls. Memory and ability to understand (cognition). Work and work Statistician. Reproductive health. Screening  You may have the following tests or measurements: Height, weight, and BMI. Blood pressure. Lipid and cholesterol levels. These may be checked every 5 years, or more frequently if you are over 43 years old. Skin check. Lung cancer screening. You may have this screening every year starting at age 16 if you have a 30-pack-year history of smoking and currently smoke or have quit within the past 15 years. Fecal occult blood test (FOBT) of the stool. You may have this test every year starting at age 73. Flexible sigmoidoscopy or colonoscopy. You may have a sigmoidoscopy every 5 years or a colonoscopy every 10 years starting at age 2. Hepatitis C blood test. Hepatitis B blood test. Sexually transmitted disease (STD) testing. Diabetes screening. This is done by checking your blood sugar (glucose) after you have not eaten for a while (fasting). You may have this done every 1-3 years. Bone density scan. This is done to screen for osteoporosis. You may have this done starting at age 32. Mammogram. This may be done every 1-2 years. Talk to your health care provider about how often you should have regular mammograms. Talk with your health care provider about your test results, treatment options, and if necessary, the need for more tests. Vaccines  Your health care provider may recommend certain vaccines, such as: Influenza vaccine. This is recommended  every year. Tetanus, diphtheria, and acellular pertussis (Tdap, Td) vaccine. You may need a Td booster every 10  years. Zoster vaccine. You may need this after age 108. Pneumococcal 13-valent conjugate (PCV13) vaccine. One dose is recommended after age 66. Pneumococcal polysaccharide (PPSV23) vaccine. One dose is recommended after age 40. Talk to your health care provider about which screenings and vaccines you need and how often you need them. This information is not intended to replace advice given to you by your health care provider. Make sure you discuss any questions you have with your health care provider. Document Released: 01/09/2016 Document Revised: 09/01/2016 Document Reviewed: 10/14/2015 Elsevier Interactive Patient Education  2017 [employer] Prevention in the Home Falls can cause injuries. They can happen to people of all ages. There are many things you can do to make your home safe and to help prevent falls. What can I do on the outside of my home? Regularly fix the edges of walkways and driveways and fix any cracks. Remove anything that might make you trip as you walk through a door, such as a raised step or threshold. Trim any bushes or trees on the path to your home. Use bright outdoor lighting. Clear any walking paths of anything that might make someone trip, such as rocks or tools. Regularly check to see if handrails are loose or broken. Make sure that both sides of any steps have handrails. Any raised decks and porches should have guardrails on the edges. Have any leaves, snow, or ice cleared regularly. Use sand or salt on walking paths during winter. Clean up any spills in your garage right away. This includes oil or grease spills. What can I do in the bathroom? Use night lights. Install grab bars by the toilet and in the tub and shower. Do not use towel bars as grab bars. Use non-skid mats or decals in the tub or shower. If you need to sit down in the shower, use a plastic, non-slip stool. Keep the floor dry. Clean up any water that spills on the floor as soon as it  happens. Remove soap buildup in the tub or shower regularly. Attach bath mats securely with double-sided non-slip rug tape. Do not have throw rugs and other things on the floor that can make you trip. What can I do in the bedroom? Use night lights. Make sure that you have a light by your bed that is easy to reach. Do not use any sheets or blankets that are too big for your bed. They should not hang down onto the floor. Have a firm chair that has side arms. You can use this for support while you get dressed. Do not have throw rugs and other things on the floor that can make you trip. What can I do in the kitchen? Clean up any spills right away. Avoid walking on wet floors. Keep items that you use a lot in easy-to-reach places. If you need to reach something above you, use a strong step stool that has a grab bar. Keep electrical cords out of the way. Do not use floor polish or wax that makes floors slippery. If you must use wax, use non-skid floor wax. Do not have throw rugs and other things on the floor that can make you trip. What can I do with my stairs? Do not leave any items on the stairs. Make sure that there are handrails on both sides of the stairs and use them. Fix handrails that are broken  or loose. Make sure that handrails are as long as the stairways. Check any carpeting to make sure that it is firmly attached to the stairs. Fix any carpet that is loose or worn. Avoid having throw rugs at the top or bottom of the stairs. If you do have throw rugs, attach them to the floor with carpet tape. Make sure that you have a light switch at the top of the stairs and the bottom of the stairs. If you do not have them, ask someone to add them for you. What else can I do to help prevent falls? Wear shoes that: Do not have high heels. Have rubber bottoms. Are comfortable and fit you well. Are closed at the toe. Do not wear sandals. If you use a stepladder: Make sure that it is fully opened.  Do not climb a closed stepladder. Make sure that both sides of the stepladder are locked into place. Ask someone to hold it for you, if possible. Clearly mark and make sure that you can see: Any grab bars or handrails. First and last steps. Where the edge of each step is. Use tools that help you move around (mobility aids) if they are needed. These include: Canes. Walkers. Scooters. Crutches. Turn on the lights when you go into a dark area. Replace any light bulbs as soon as they burn out. Set up your furniture so you have a clear path. Avoid moving your furniture around. If any of your floors are uneven, fix them. If there are any pets around you, be aware of where they are. Review your medicines with your doctor. Some medicines can make you feel dizzy. This can increase your chance of falling. Ask your doctor what other things that you can do to help prevent falls. This information is not intended to replace advice given to you by your health care provider. Make sure you discuss any questions you have with your health care provider. Document Released: 10/09/2009 Document Revised: 05/20/2016 Document Reviewed: 01/17/2015 Elsevier Interactive Patient Education  2017 [employer].

## 2021-09-18 NOTE — Telephone Encounter (Signed)
REFERRAL REQUEST Telephone Note  Have you been seen at our office for this problem?  No but pt had ov with Christy on 08/24/2021. Pt seen her oncologist yesterday and he wants her to see her urologist at [facility]. She called [facility] to schedule and apt but needs a referral and ov notes from PCP.  (Advise that they may need an appointment with their PCP before a referral can be done)  Reason for Referral: blood has been thick and weight is going up and down Retaining and losing fluids Referral discussed with patient:  Best contact number of patient for referral team:    Has patient been seen by a specialist for this issue before:  Patient provider preference for referral: [facility] Patient location preference for referral:    Patient notified that referrals can take up to a week or longer to process. If they haven't heard anything within a week they should call back and speak with the referral department.

## 2021-09-18 NOTE — Progress Notes (Signed)
Chronic Care Management Pharmacy Note  09/18/2021 Name:  Tammie [name] MRN:  [id number] DOB:  Apr 22, 1941  Summary: WARFARIN/AFIB  Recommendations/Changes made from today's visit: Atrial Fibrillation: Controlled; current rate/rhythm control- METOPROLOL; anticoagulant treatment: WARFARIN TAKE 5 MG TONIGHT THEN RESUME HOME DOSE--WILL FOLLOW UP ON TUESDAY CHADS2VASc score: 6 Home blood pressure, heart rate readings:   Follow Up Plan: Telephone follow up appointment with care management team member scheduled for: 09/22/21  Subjective: Tammie [name] is an 80 y.o. year old female who is a primary patient of [name], FNP.  The CCM team was consulted for assistance with disease management and care coordination needs.    Engaged with patient by telephone for follow up visit in response to provider referral for pharmacy case management and/or care coordination services.   Consent to Services:  The patient was given information about Chronic Care Management services, agreed to services, and gave verbal consent prior to initiation of services.  Please see initial visit note for detailed documentation.   Patient Care Team: [name], FNP as PCP - General (Family Medicine) Minus Breeding, MD as PCP - Cardiology (Cardiology) [name], MD (Inactive) as Consulting Physician (Urology) Rexene Agent, MD as Attending Physician (Nephrology) Curt Bears, MD as Consulting Physician (Oncology) [name], MD as Consulting Physician (Radiation Oncology) Druscilla Brownie, MD as Consulting Physician (Dermatology) Jovita Kussmaul, MD as Consulting Physician (General Surgery) Melrose Nakayama, MD as Consulting Physician (Cardiothoracic Surgery) Okey Regal, OD as Consulting Physician (Optometry) Mauro Kaufmann, RN as Oncology Nurse Navigator Rockwell Germany, RN as Oncology Nurse Navigator Lavera Guise, [facility] as Pharmacist (Family Medicine) objective:  Lab  Results  Component Value Date   CREATININE 1.51 (H) 09/10/2021   CREATININE 1.79 (H) 08/04/2021   CREATININE 1.60 (H) 07/06/2021    Lab Results  Component Value Date   HGBA1C 5.4 07/26/2016   Last diabetic Eye exam: No results found for: HMDIABEYEEXA  Last diabetic Foot exam: No results found for: HMDIABFOOTEX      Component Value Date/Time   CHOL 142 01/22/2019 1222   TRIG 199 (H) 01/22/2019 1222   HDL 44 01/22/2019 1222   CHOLHDL 3.2 01/22/2019 1222   [place] 58 01/22/2019 1222    Hepatic Function Latest Ref Rng & Units 08/04/2021 02/02/2021 12/29/2020  Total Protein 6.5 - 8.1 g/dL 7.1 7.2 7.1  Albumin 3.5 - 5.0 g/dL 3.5 3.9 4.5  AST 15 - 41 U/L '16 16 18  ' ALT 0 - 44 U/L '10 13 12  ' Alk Phosphatase 38 - 126 U/L 51 57 48  Total Bilirubin 0.3 - 1.2 mg/dL 0.4 0.4 0.4  Bilirubin, Direct 0.01 - 0.4 mg/dL - - -    Lab Results  Component Value Date/Time   TSH 2.275 03/22/2019 12:44 PM   TSH 3.194 12/21/2018 10:14 AM   TSH 3.443 12/22/2017 10:13 AM   TSH 4.019 (H) 11/10/2017 11:29 AM   TSH 3.970 07/26/2016 12:03 PM   TSH 1.223 03/09/2012 06:40 AM    CBC Latest Ref Rng & Units 08/04/2021 02/02/2021 12/29/2020  WBC 4.0 - 10.5 K/uL 6.0 6.3 6.7  Hemoglobin 12.0 - 15.0 g/dL 12.9 14.4 15.5  Hematocrit 36.0 - 46.0 % 40.5 44.8 47.9(H)  Platelets 150 - 400 K/uL 183 150 165    No results found for: VD25OH  Clinical ASCVD: Yes  The ASCVD Risk score (Arnett DK, et al., 2019) failed to calculate for the following reasons:   The 2019  ASCVD risk score is only valid for ages 64 to 66   The patient has a prior MI or stroke diagnosis    Other: (CHADS2VASc if Afib, PHQ9 if depression, MMRC or CAT for COPD, ACT, DEXA)  Social History   Tobacco Use  Smoking Status Former   Packs/day: 3.00   Years: 32.00   Pack years: 96.00   Types: Cigarettes   Start date: 12/27/1989   Quit date: 03/08/1990   Years since quitting: 31.5  Smokeless Tobacco Former   Quit date: 03/08/1990  Tobacco Comments    smoked 3ppd from 1959-1991    BP Readings from Last 3 Encounters:  09/17/21 (!) 153/72  09/11/21 (!) 142/68  08/24/21 (!) 148/74   Pulse Readings from Last 3 Encounters:  09/17/21 62  09/11/21 70  08/24/21 (!) 57   Wt Readings from Last 3 Encounters:  09/17/21 264 lb 1 oz (119.8 kg)  09/11/21 269 lb 6.4 oz (122.2 kg)  08/24/21 266 lb 12.8 oz (121 kg)    Assessment: Review of patient past medical history, allergies, medications, health status, including review of consultants reports, laboratory and other test data, was performed as part of comprehensive evaluation and provision of chronic care management services.   SDOH:  (Social Determinants of Health) assessments and interventions performed:    CCM Care Plan  Allergies  Allergen Reactions   Tape Hives   Contrast Media [Iodinated Diagnostic Agents] Hives   Iohexol Other (See Comments)     Code: HIVES, Desc: hives w/ contrast '08// better w/ benadryl    Sulfa Antibiotics Other (See Comments)    REACTION: unknown   Sulfamethoxazole-Trimethoprim Other (See Comments)    REACTION: unknown    Medications Reviewed Today     Reviewed by Curt Bears, MD (Physician) on 09/17/21 at 1345  Med List Status: <None>   Medication Order Taking? Sig Documenting Provider Last Dose Status Informant  acetaminophen (TYLENOL) 650 MG CR tablet [id number]  Take 650 mg by mouth 3 (three) times daily as needed for pain.  [provider]  Active Self           Med Note Dorrene German, NICOLE L   Fri Jun 27, 2015  9:23 AM) .   albuterol (PROAIR HFA) 108 (90 Base) MCG/ACT inhaler [id number]  Inhale 2 puffs into the lungs every 6 (six) hours as needed for wheezing or shortness of breath. [name] A, FNP  Active   amLODipine (NORVASC) 10 MG tablet [id number]  Take 1 tablet (10 mg total) by mouth daily. [name], FNP  Active     Discontinued 09/17/21 1220 (Completed Course) diphenhydrAMINE (BENADRYL) 25 MG tablet [id number]  Take 25  mg by mouth every 6 (six) hours as needed for allergies. [provider]  Active Self    Discontinued 09/17/21 1220 (Completed Course)   Ferrous Sulfate (IRON) 142 (45 Fe) MG TBCR [id number]  Take 1 tablet by mouth daily. [provider]  Active Self  Flaxseed, Linseed, (FLAXSEED OIL PO) [id number]  Take 1 tablet by mouth at bedtime. [provider]  Active Self  furosemide (LASIX) 20 MG tablet [id number]  Take 1 tablet (20 mg total) by mouth 2 (two) times daily.  Patient taking differently: Take 20 mg by mouth daily. Only takes one daily   [name], [place]  Active Self  Garlic 5945 MG CAPS [id number]  Take 1,000 mg by mouth 2 (two) times daily. [provider]  Active Self  HYDROcodone-acetaminophen (NORCO/VICODIN) 5-325 MG  tablet [id number]  Take 1 tablet by mouth every 6 (six) hours as needed for moderate pain or severe pain. Jovita Kussmaul, MD  Active     Discontinued 09/17/21 1221 (Patient Preference)   letrozole [facility]) 2.5 MG tablet [id number]  Take 1 tablet (2.5 mg total) by mouth daily. Nicholas Lose, MD  Active   metoprolol tartrate (LOPRESSOR) 100 MG tablet [id number]  TAKE 1 & 1/2 (ONE & ONE-HALF) TABLETS BY MOUTH TWICE DAILY Hawks, Christy A, FNP  Active   nystatin (MYCOSTATIN/NYSTOP) powder [id number] No Apply 1 application topically 3 (three) times daily.  Patient not taking: Reported on 09/17/2021   [name], FNP Not Taking Active   Omega-3 Fatty Acids (FISH OIL) 1000 MG CAPS [id number]  Take 1 capsule by mouth daily. [provider]  Active Self  simvastatin (ZOCOR) 10 MG tablet [id number]  TAKE 1 TABLET BY MOUTH ONCE DAILY AT  [address] Christy A, FNP  Active   warfarin (COUMADIN) 2.5 MG tablet [id number]  TAKE ONE TABLET BY MOUTH ONCE DAILY EXCEPT 1/2 TABLET ON FRIDAYS. [name], FNP  Active            Med Note Delories Heinz Sep 17, 2021 12:23 PM) 1.5 tablets Tuesday and wed.            Patient Active Problem  List   Diagnosis Date Noted   Encounter for therapeutic drug monitoring 08/06/2021   Warfarin anticoagulation 10/06/2020   Acute on chronic diastolic CHF (congestive heart failure) ([place]) 09/14/2020   Acute respiratory failure with hypoxia ([place]) 10/15/2019   Port-A-Cath in place 05/18/2018   Genetic testing 07/13/2017   Primary adenocarcinoma of colon ([place]) 07/07/2017   Encounter for antineoplastic immunotherapy 12/17/2016   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 ([place])    Cancer with unknown primary site [facility])    Liver mass 10/10/2016   Neuropathy 07/26/2016   Antineoplastic chemotherapy induced anemia 03/24/2015   CKD (chronic kidney disease), stage III ([place]) 03/24/2015   Hyperlipidemia with target LDL less than 100 03/10/2015   Peripheral edema 03/10/2015   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 ([place]) 08/29/2014   Malignant neoplasm of upper-inner quadrant of left breast in female, estrogen receptor positive ([place]) 11/13/2013   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/13/2012   HTN (hypertension) 03/13/2012   Atrial fibrillation ([place]) 03/08/2012   Asthma 04/17/2011   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   DOE (dyspnea on exertion) 09/04/2010   CVA 05/21/2009    Immunization History  Administered Date(s) Administered   Fluad Quad(high Dose 65+) 10/12/2019, 09/29/2020   Influenza Whole 09/26/2009, 09/26/2010   Influenza, High Dose Seasonal PF 09/27/2016, 09/29/2018   Influenza,inj,Quad PF,6+ Mos 10/29/2013, 09/10/2014, 11/24/2015, 09/23/2017   Moderna Sars-Covid-2 Vaccination 02/21/2020, 03/21/2020   PFIZER Comirnaty([place])Covid-19 Tri-Sucrose Vaccine 02/04/2021   Pneumococcal Conjugate-13 06/16/2012   Pneumococcal Polysaccharide-23 09/26/2008   Tdap 07/20/2012   Zoster Recombinat (Shingrix) 08/24/2021    Conditions to be addressed/monitored: Atrial Fibrillation  Care Plan : PHARMD MEDICATION MANAGEMENT  Updates made by Lavera Guise, RPH since 09/18/2021 12:00 AM      Problem: DISEASE PROGRESSION PREVENTION      Long-Range Goal: ATRIAL FIBRILLATION   Recent Progress: On track  Priority: High  Note:   Current Barriers:  Unable to independently monitor therapeutic efficacy Unable to maintain control of INR-BRIDGE FOR post-SURGERY  Pharmacist Clinical Goal(s):  Over the next 90 days, patient will achieve adherence to monitoring guidelines and  medication adherence to achieve therapeutic efficacy achieve control of ANTICOAGULATION as evidenced by GOAL INR--BRIDGE LOVENOX FOR post-SURGERY  through collaboration with PharmD and provider.    Interventions: 1:1 collaboration with [name], FNP regarding development and update of comprehensive plan of care as evidenced by provider attestation and co-signature Inter-disciplinary care team collaboration (see longitudinal plan of care) Comprehensive medication review performed; medication list updated in electronic medical record  Atrial Fibrillation: Controlled; current rate/rhythm control- METOPROLOL; anticoagulant treatment: WARFARIN TAKE 5 MG TONIGHT THEN RESUME HOME DOSE--WILL FOLLOW UP ON TUESDAY CHADS2VASc score: 6 Home blood pressure, heart rate readings:    Patient Goals/Self-Care Activities Over the next 90 days, patient will:  - take medications as prescribed INRs weekly at home  Follow Up Plan: Telephone follow up appointment with care management team member scheduled for: 09/22/21      Medication Assistance: None required.  Patient affirms current coverage meets needs.  Patient's preferred pharmacy is:  [facility] [address] [place] - [address] [name] [address] Phone: [phone] Fax: [phone]  [facility]) - [place] [place] [address] Phone: [phone] Fax: [phone]   Follow Up:  Patient agrees to Care Plan and Follow-up.  Plan:  Telephone follow up appointment with care management team member scheduled for:  9/27  Regina Eck, PharmD, BCPS Clinical Pharmacist, [place]  [facility] Phone [phone]

## 2021-09-21 ENCOUNTER — Encounter: Payer: Self-pay | Admitting: *Deleted

## 2021-09-21 ENCOUNTER — Other Ambulatory Visit: Payer: Self-pay | Admitting: Family

## 2021-09-21 DIAGNOSIS — C50212 Malignant neoplasm of upper-inner quadrant of left female breast: Secondary | ICD-10-CM

## 2021-09-21 DIAGNOSIS — N1832 Chronic kidney disease, stage 3b: Secondary | ICD-10-CM

## 2021-09-21 DIAGNOSIS — Z170 Estrogen receptor positive status [ER+]: Secondary | ICD-10-CM

## 2021-09-21 NOTE — Progress Notes (Signed)
Referral placed.   [name], FNP

## 2021-09-22 DIAGNOSIS — C229 Malignant neoplasm of liver, not specified as primary or secondary: Secondary | ICD-10-CM

## 2021-09-22 DIAGNOSIS — C801 Malignant (primary) neoplasm, unspecified: Secondary | ICD-10-CM

## 2021-09-25 DIAGNOSIS — I4891 Unspecified atrial fibrillation: Secondary | ICD-10-CM

## 2021-09-28 ENCOUNTER — Ambulatory Visit (INDEPENDENT_AMBULATORY_CARE_PROVIDER_SITE_OTHER): Payer: HMO | Admitting: Family

## 2021-09-28 ENCOUNTER — Encounter: Payer: Self-pay | Admitting: Family

## 2021-09-28 ENCOUNTER — Other Ambulatory Visit: Payer: Self-pay | Admitting: Family

## 2021-09-28 ENCOUNTER — Other Ambulatory Visit: Payer: Self-pay

## 2021-09-28 VITALS — BP 105/66 | HR 61 | Temp 96.0°F | Ht 71.0 in | Wt 267.4 lb

## 2021-09-28 DIAGNOSIS — R350 Frequency of micturition: Secondary | ICD-10-CM | POA: Diagnosis not present

## 2021-09-28 DIAGNOSIS — N1832 Chronic kidney disease, stage 3b: Secondary | ICD-10-CM | POA: Diagnosis not present

## 2021-09-28 DIAGNOSIS — C50212 Malignant neoplasm of upper-inner quadrant of left female breast: Secondary | ICD-10-CM | POA: Diagnosis not present

## 2021-09-28 DIAGNOSIS — Z170 Estrogen receptor positive status [ER+]: Secondary | ICD-10-CM

## 2021-09-28 LAB — MICROSCOPIC EXAMINATION
Renal Epithel, UA: NONE SEEN /hpf
WBC, UA: >30 /hpf — AB (ref 0–5)

## 2021-09-28 LAB — URINALYSIS, COMPLETE
Bilirubin, UA: NEGATIVE
Glucose, UA: NEGATIVE
Nitrite, UA: POSITIVE — AB
Specific Gravity, UA: 1.02 (ref 1.005–1.030)
Urobilinogen, Ur: 0.2 mg/dL (ref 0.2–1.0)
pH, UA: 5 (ref 5.0–7.5)

## 2021-09-28 MED ORDER — CEPHALEXIN 500 MG PO CAPS: 500.0000 mg | ORAL_CAPSULE | Freq: Two times a day (BID) | ORAL | 0 refills | Status: DC

## 2021-09-28 NOTE — Patient Instructions (Signed)
Urinary Frequency, Adult Urinary frequency means urinating more often than usual. You may urinate every 1-2 hours even though you drink a normal amount of fluid and do not have a bladder infection or condition. Although you urinate more often than normal,the total amount of urine produced in a day is normal. With urinary frequency, you may have an urgent need to urinate often. The stress and anxiety of needing to find a bathroom quickly can make this urge worse. This condition may go away on its own or you may need treatment at home. Home treatment may include bladder training, exercises, taking medicines, ormaking changes to your diet. Follow these instructions at home: Bladder health  Keep a bladder diary if told by your health care provider. Keep track of: What you eat and drink. How often you urinate. How much you urinate. Follow a bladder training program if told by your health care provider. This may include: Learning to delay going to the bathroom. Double urinating (voiding). This helps if you are not completely emptying your bladder. Scheduled voiding. Do Kegel exercises as told by your health care provider. Kegel exercises strengthen the muscles that help control urination, which may help the condition.  Eating and drinking If told by your health care provider, make diet changes, such as: Avoiding caffeine. Drinking fewer fluids, especially alcohol. Not drinking in the evening. Avoiding foods or drinks that may irritate the bladder. These include coffee, tea, soda, artificial sweeteners, citrus, tomato-based foods, and chocolate. Eating foods that help prevent or ease constipation. Constipation can make this condition worse. Your health care provider may recommend that you: Drink enough fluid to keep your urine pale yellow. Take over-the-counter or prescription medicines. Eat foods that are high in fiber, such as beans, whole grains, and fresh fruits and vegetables. Limit foods  that are high in fat and processed sugars, such as fried or sweet foods. General instructions Take over-the-counter and prescription medicines only as told by your health care provider. Keep all follow-up visits as told by your health care provider. This is important. Contact a health care provider if: You start urinating more often. You feel pain or irritation when you urinate. You notice blood in your urine. Your urine looks cloudy. You develop a fever. You begin vomiting. Get help right away if: You are unable to urinate. Summary Urinary frequency means urinating more often than usual. With urinary frequency, you may urinate every 1-2 hours even though you drink a normal amount of fluid and do not have a bladder infection or other bladder condition. Your health care provider may recommend that you keep a bladder diary, follow a bladder training program, or make dietary changes. If told by your health care provider, do Kegel exercises to strengthen the muscles that help control urination. Take over-the-counter and prescription medicines only as told by your health care provider. Contact a health care provider if your symptoms do not improve or get worse. This information is not intended to replace advice given to you by your health care provider. Make sure you discuss any questions you have with your healthcare provider. Document Revised: 06/22/2018 Document Reviewed: 06/22/2018 Elsevier Patient Education  2021 Elsevier Inc.  

## 2021-09-28 NOTE — Progress Notes (Signed)
Subjective:    Patient ID: Tammie [name], female    DOB: Oct 17, 1941, 80 y.o.   MRN: [id number]  Chief Complaint  Patient presents with   Referral    Referral for nephrologist     Pt presents to the office today to discuss nephrologists. She called the office last week and states her Oncologists said she needed to see a specialists for her kidneys.   After discussing, it seems like she may need a Urologists referral instead of nephrologists as her symptoms are urinary frequency. However, she does have CKD.   She had a left breast lumpectomy on 09/11/21 for breast cancer. She is currently taking letrozole 2.5 mg daily.  Urinary Frequency  This is a new problem. The current episode started in the past 7 days. The problem occurs intermittently. There has been no fever. Associated symptoms include frequency, nausea and urgency. Pertinent negatives include no discharge, flank pain, hematuria, hesitancy or vomiting. She has tried increased fluids for the symptoms. The treatment provided mild relief.     Review of Systems  Gastrointestinal:  Positive for nausea. Negative for vomiting.  Genitourinary:  Positive for frequency and urgency. Negative for flank pain, hematuria and hesitancy.  All other systems reviewed and are negative.     Objective:   Physical Exam Vitals reviewed.  Constitutional:      General: She is not in acute distress.    Appearance: She is well-developed. She is obese.  HENT:     Head: Normocephalic and atraumatic.  Eyes:     Pupils: Pupils are equal, round, and reactive to light.  Neck:     Thyroid: No thyromegaly.  Cardiovascular:     Rate and Rhythm: Normal rate and regular rhythm.     Heart sounds: Normal heart sounds. No murmur heard. Pulmonary:     Effort: Pulmonary effort is normal. No respiratory distress.     Breath sounds: Wheezing present.  Abdominal:     General: Bowel sounds are normal. There is no distension.     Palpations: Abdomen is soft.      Tenderness: There is no abdominal tenderness.  Musculoskeletal:        General: No tenderness.     Cervical back: Normal range of motion and neck supple.     Right lower leg: Edema (3+) present.     Left lower leg: Edema (3+) present.     Comments: Bilateral discoloration of bilateral legs  Skin:    General: Skin is warm and dry.  Neurological:     Mental Status: She is alert and oriented to person, place, and time.     Cranial Nerves: No cranial nerve deficit.     Deep Tendon Reflexes: Reflexes are normal and symmetric.  Psychiatric:        Behavior: Behavior normal.        Thought Content: Thought content normal.        Judgment: Judgment normal.      BP 105/66   Pulse 61   Temp (!) 96 F (35.6 C) (Temporal)   Ht 5\' 11"  (1.803 m)   Wt 267 lb 6.4 oz (121.3 kg)   SpO2 95%   BMI 37.29 kg/m      Assessment & Plan:  MELANA HINGLE comes in today with chief complaint of Referral (Referral for nephrologist /)   Diagnosis and orders addressed:  1. Urinary frequency - Urinalysis, Complete - Urine Culture  2. Malignant neoplasm of upper-inner quadrant of left  breast in female, estrogen receptor positive ([place]) Keep Oncologists follow up  3. Stage 3b chronic kidney disease ([place]) Will keep Nephrologists referral as she does have CKD   Labs reviewed  Health Maintenance reviewed Diet and exercise encouraged  Follow up plan: Keep chronic follow up   [name], FNP

## 2021-10-02 LAB — URINE CULTURE

## 2021-10-05 ENCOUNTER — Telehealth: Payer: Self-pay | Admitting: Internal Medicine

## 2021-10-05 NOTE — Telephone Encounter (Signed)
Scheduled per 10/8 in basket, pt has been called and confirmed phone visit

## 2021-10-17 IMAGING — CT CT CHEST W/O CM
2 of 4 series · 13 of 36 positions shown, 16 images · non-contrast
Comparison: 12/30/2018

CLINICAL DATA: Restaging non-small cell lung cancer. Additional
history of breast cancer and colorectal carcinoma

EXAM:
CT CHEST, ABDOMEN AND PELVIS WITHOUT CONTRAST
TECHNIQUE: Multidetector CT imaging of the chest, abdomen and pelvis was
performed following the standard protocol without IV contrast.

[Series 2: cap w/o · axial · non-contrast · 0.85mm/px · z∈[-693,-143]mm · 10 of 134 slices shown, 13 images]
[im 12/134  mediastinal]
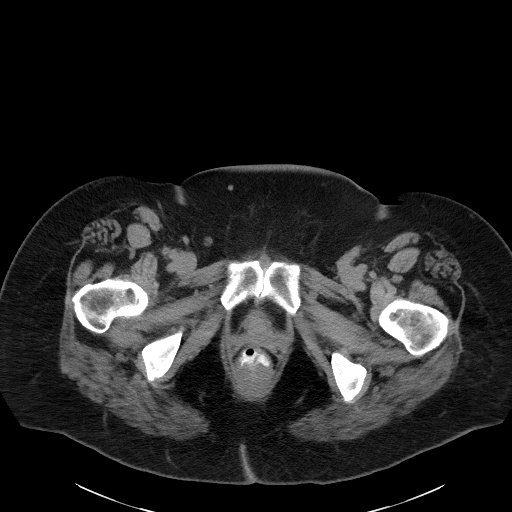
[im 12/134  lung]
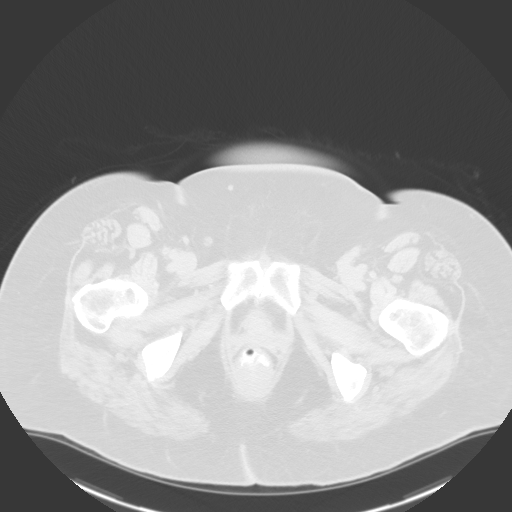
[im 23/134  lung]
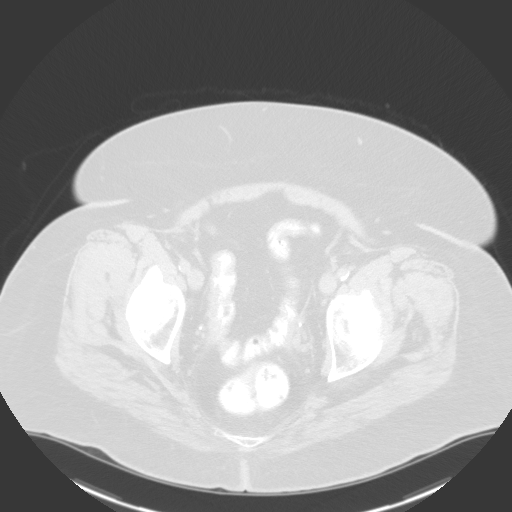
[im 34/134  lung]
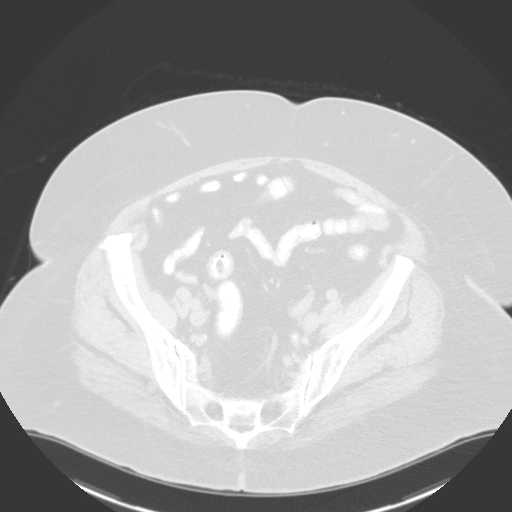
[im 45/134  lung]
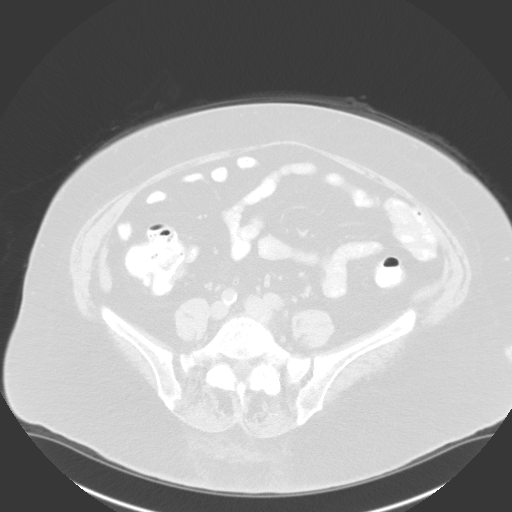
[im 56/134  mediastinal]
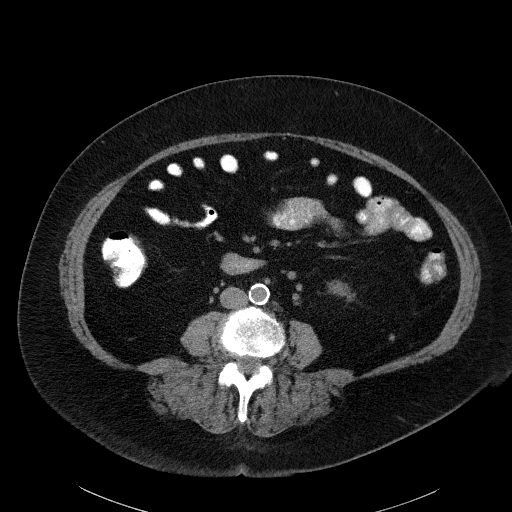
[im 56/134  lung]
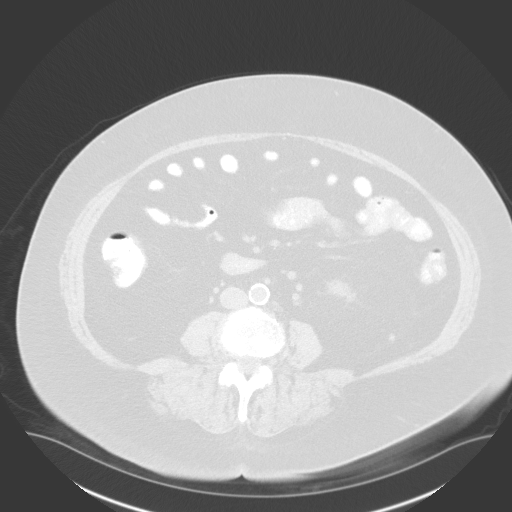
[im 78/134  lung]
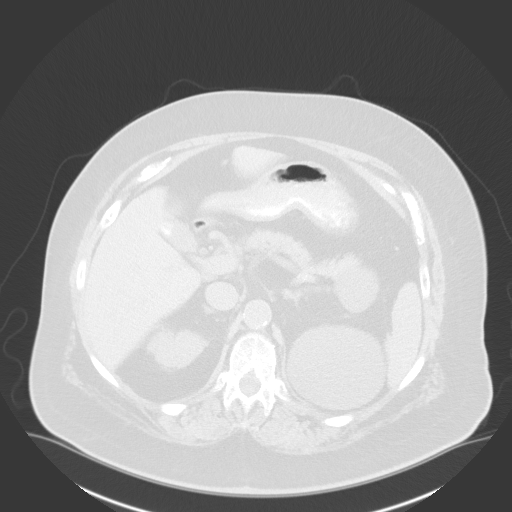
[im 89/134  lung]
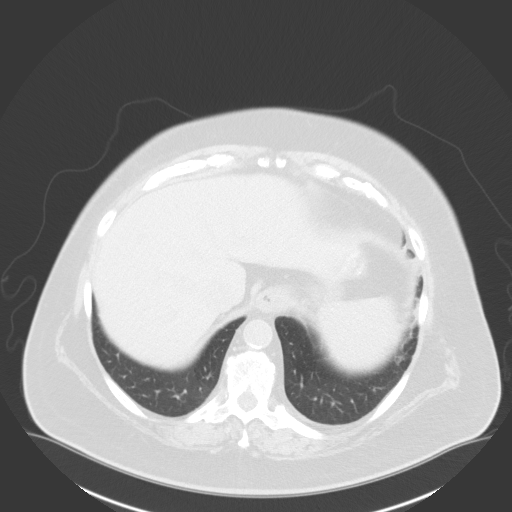
[im 100/134  lung]
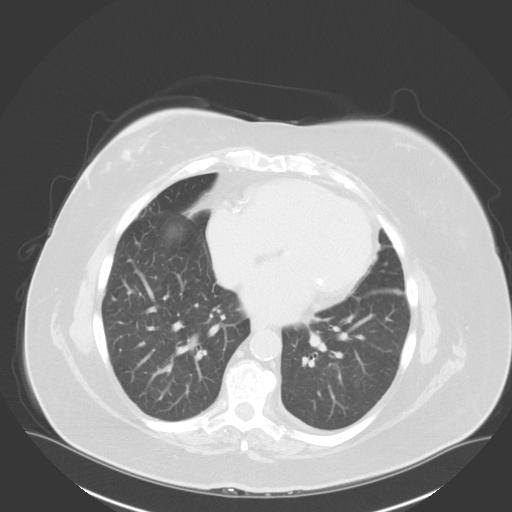
[im 111/134  mediastinal]
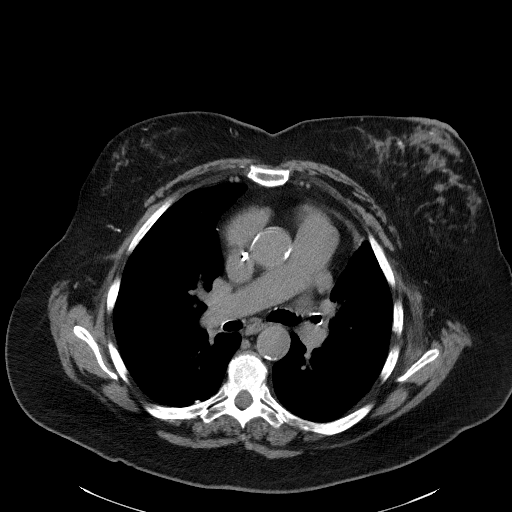
[im 111/134  lung]
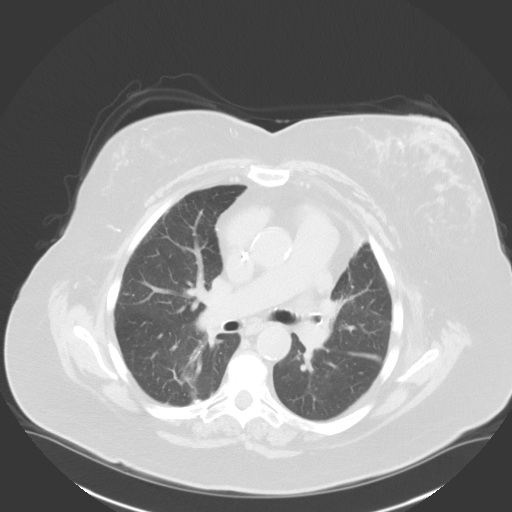
[im 122/134  lung]
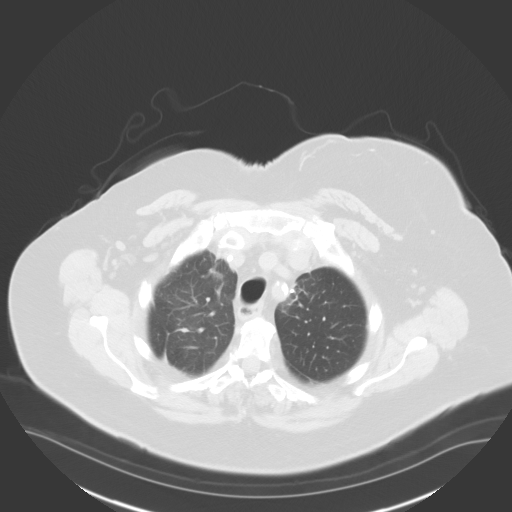

[Series 5: coronals · coronal · 0.82mm/px · 3 of 172 slices shown]
[im 35/172  lung]
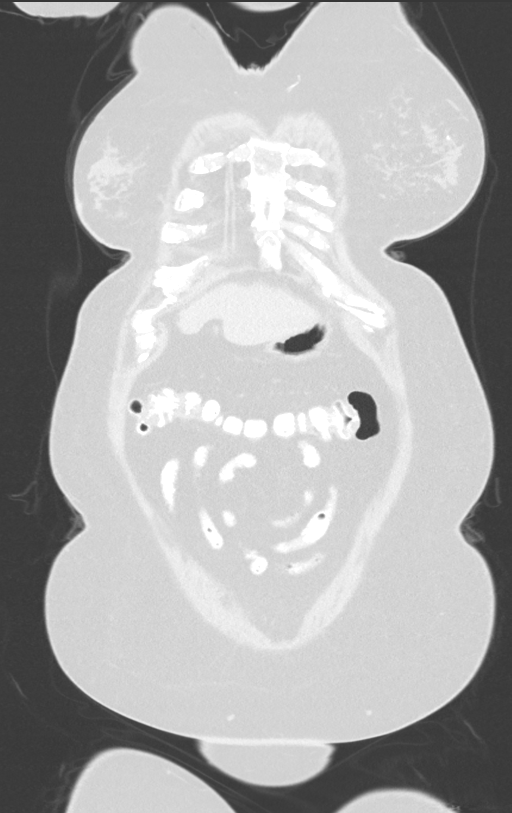
[im 69/172  lung]
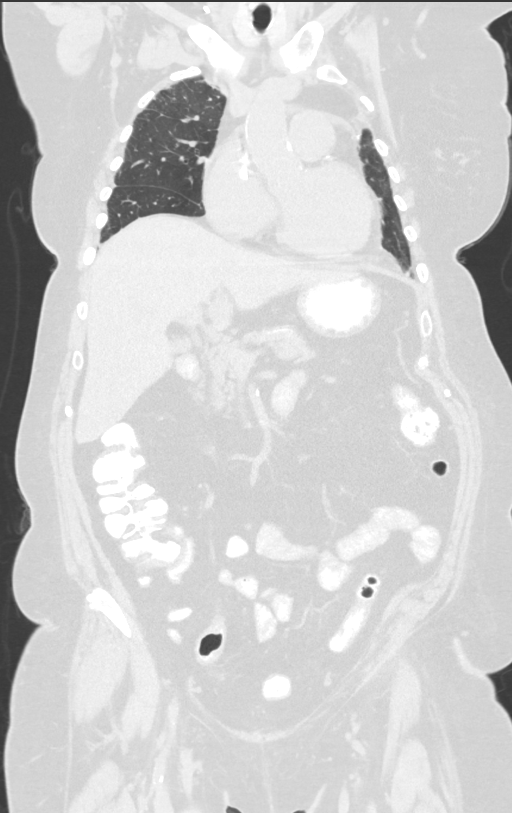
[im 103/172  lung]
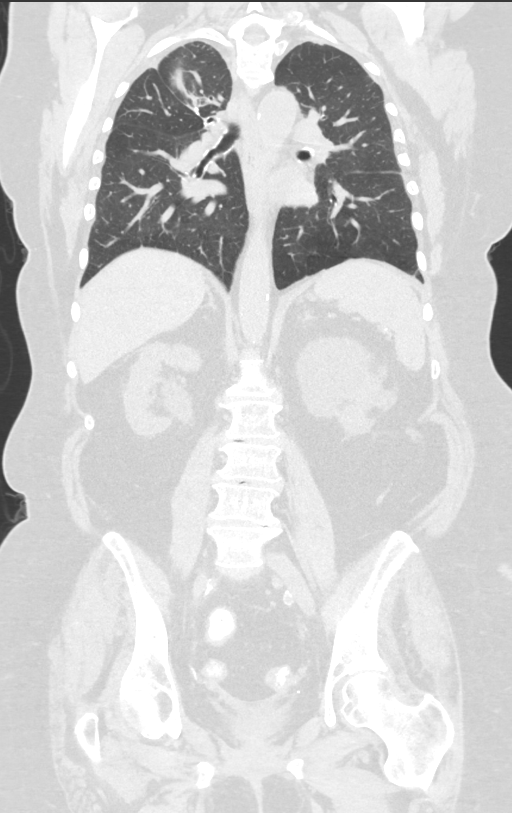

[13 of 36 positions shown; findings below may reference images not displayed]

FINDINGS: CT CHEST FINDINGS

Cardiovascular: Heart size appears within normal limits. No
pericardial effusion identified. Aortic atherosclerosis. Three
vessel coronary artery calcifications.

Mediastinum/Nodes: Normal appearance of the thyroid gland. The
trachea appears patent and is midline. Normal appearance of the
esophagus. No mediastinal or hilar adenopathy.

Lungs/Pleura: Status post right upper lobe segmentectomy and right
lower lobe wedge resection. Left upper lobe wedge resection. No
pleural effusion. Right lower lobe lung nodule appears measures
x 0.9 cm. Previously 1.3 x 1.1 cm. This appears part solid with a
central solid component measuring 0.7 cm, image 138/5.

Tiny nodule in the superior segment of the right lower lobe is
unchanged measuring 3 mm, image 47/4. No new lung nodules.

Musculoskeletal: No chest wall mass or suspicious bone lesions
identified.

CT ABDOMEN PELVIS FINDINGS

Hepatobiliary: No suspicious liver lesion. Gallstone is again noted
measuring 2.3 cm. No bile duct dilatation.

Pancreas: Unremarkable. No pancreatic ductal dilatation or
surrounding inflammatory changes.

Spleen: Normal in size without focal abnormality.

Adrenals/Urinary Tract: Normal adrenal glands. Stone within inferior
pole of left kidney measures 7 mm. Inferior pole of left kidney
stone measures 4 mm. Multiple left renal cysts. These are
incompletely characterized without IV contrast measuring up to 9 mm.
The urinary bladder appears normal.

Stomach/Bowel: Stomach is within normal limits. Appendix appears
normal. No evidence of bowel wall thickening, distention, or
inflammatory changes.

Vascular/Lymphatic: Aortic atherosclerosis. No aneurysm. No
abdominopelvic adenopathy.

Reproductive: Status post hysterectomy. No adnexal masses.

Other: No free fluid or fluid collections. No peritoneal nodule or
mass.

Musculoskeletal: No acute or significant osseous findings.
IMPRESSION: 1. No significant change in size of slowly growing part solid nodule
within the posterior right lower lobe. Findings are concerning for
small pulmonary adenocarcinoma.
2. Stable postsurgical changes involving both lungs without evidence
for local tumor recurrence or metastatic disease.
3. Aortic atherosclerosis and 3 vessel coronary artery
calcifications.
4. Gallstones.
5. Nonobstructing left renal calculi.

Aortic Atherosclerosis ([id number]).

## 2021-10-28 ENCOUNTER — Telehealth: Payer: Self-pay | Admitting: *Deleted

## 2021-10-28 ENCOUNTER — Ambulatory Visit: Payer: Self-pay | Admitting: Family Medicine

## 2021-10-28 DIAGNOSIS — I4811 Longstanding persistent atrial fibrillation: Secondary | ICD-10-CM

## 2021-10-28 DIAGNOSIS — I4891 Unspecified atrial fibrillation: Secondary | ICD-10-CM

## 2021-10-28 LAB — POCT INR: INR: 1.7 — AB (ref 2.0–3.0)

## 2021-10-28 NOTE — Progress Notes (Signed)
INR 1.7, not at goal.  Take 5 mg today and tomorrow then resume 2.5 mg dosing daily until next INR recheck.  Recheck in [facility].

## 2021-10-28 NOTE — Telephone Encounter (Signed)
Pt aware to take 2 tablets (5 mg) of her Coumadin today & tomorrow then resume her 1 tab (2.5 mg) till she rechecks her INR next week    INR 1.7, not at goal.   Take 5 mg today and tomorrow then resume 2.5 mg dosing daily until next INR recheck.   Recheck in 1 week

## 2021-10-28 NOTE — Telephone Encounter (Signed)
Fax received [facility] PT/INR self testing service Test date/time 12/27/20 333 pm INR 1.7

## 2021-10-29 ENCOUNTER — Encounter: Payer: Self-pay | Admitting: *Deleted

## 2021-10-29 ENCOUNTER — Inpatient Hospital Stay: Payer: HMO | Attending: Hematology and Oncology | Admitting: *Deleted

## 2021-10-29 DIAGNOSIS — C50212 Malignant neoplasm of upper-inner quadrant of left female breast: Secondary | ICD-10-CM

## 2021-10-29 DIAGNOSIS — Z170 Estrogen receptor positive status [ER+]: Secondary | ICD-10-CM

## 2021-10-29 NOTE — Progress Notes (Signed)
  2 Identifiers used for purposes of verification   for patient visit. SCP reviewed and completed. SDOH assessed and completed. Pt is currently experiencing fatigue when doing housework . She rates a 10/10 but says she always have felt this way. She takes rest periods. Pt recently stopped taking potassium per PCP orders because levels were too high. Medications and allergies were reviewed and updated.No barriers  no needs noted at this visit.

## 2021-11-10 ENCOUNTER — Other Ambulatory Visit (INDEPENDENT_AMBULATORY_CARE_PROVIDER_SITE_OTHER): Payer: HMO | Admitting: Family

## 2021-11-10 ENCOUNTER — Telehealth: Payer: Self-pay | Admitting: *Deleted

## 2021-11-10 ENCOUNTER — Telehealth: Payer: Self-pay | Admitting: Family Medicine

## 2021-11-10 DIAGNOSIS — I4811 Longstanding persistent atrial fibrillation: Secondary | ICD-10-CM | POA: Diagnosis not present

## 2021-11-10 DIAGNOSIS — R791 Abnormal coagulation profile: Secondary | ICD-10-CM

## 2021-11-10 MED ORDER — PHYTONADIONE 5 MG PO TABS: 5.0000 mg | ORAL_TABLET | Freq: Once | ORAL | 0 refills | Status: AC

## 2021-11-10 NOTE — Telephone Encounter (Signed)
Fax received [facility]INR PT/INR self testing service Test date/time 11/10/21 1230 pm INR 5.7

## 2021-11-10 NOTE — Telephone Encounter (Signed)
Telephone visit appointment made with patient.   [name], FNP

## 2021-11-10 NOTE — Progress Notes (Signed)
Virtual Visit  Note Due to COVID-19 pandemic this visit was conducted virtually. This visit type was conducted due to national recommendations for restrictions regarding the COVID-19 Pandemic (e.g. social distancing, sheltering in place) in an effort to limit this patient's exposure and mitigate transmission in our community. All issues noted in this document were discussed and addressed.  A physical exam was not performed with this format.  I connected with Tammie [name] on 11/10/21 at 1:34 pm  by telephone and verified that I am speaking with the correct person using two identifiers. Tammie [name] is currently located at home and no one is currently with her during visit. The provider, Tammie Dun, FNP is located in their office at time of visit.  I discussed the limitations, risks, security and privacy concerns of performing an evaluation and management service by telephone and the availability of in person appointments. I also discussed with the patient that there may be a patient responsible charge related to this service. The patient expressed understanding and agreed to proceed.  Tammie [name], [place] are scheduled for a virtual visit with your provider today.    Just as we do with appointments in the office, we must obtain your consent to participate.  Your consent will be active for this visit and any virtual visit you may have with one of our providers in the next 365 days.    If you have a MyChart account, I can also send a copy of this consent to you electronically.  All virtual visits are billed to your insurance company just like a traditional visit in the office.  As this is a virtual visit, video technology does not allow for your provider to perform a traditional examination.  This may limit your provider's ability to fully assess your condition.  If your provider identifies any concerns that need to be evaluated in person or the need to arrange testing such as labs, EKG, etc, we will make  arrangements to do so.    Although advances in technology are sophisticated, we cannot ensure that it will always work on either your end or our end.  If the connection with a video visit is poor, we may have to switch to a telephone visit.  With either a video or telephone visit, we are not always able to ensure that we have a secure connection.   I need to obtain your verbal consent now.   Are you willing to proceed with your visit today?   Tammie [name] has provided verbal consent on 11/10/2021 for a virtual visit (video or telephone).   Tammie [name], [place] 11/10/2021  1:36 PM    History and Present Illness:  HPI  Pt calls the office today with an elevated INR. She checks her INR weekly at home. Today it was 5.7. She has taken Keflex two rounds. She is currently taking her warfarin 2 1/2 mg daily.   Denies any bleeding or bruising.   See anticoagulation flow sheet.   Review of Systems  All other systems reviewed and are negative.   Observations/Objective: No SOB or distress noted   Assessment and Plan: 1. Longstanding persistent atrial fibrillation (HCC)  - phytonadione (VITAMIN K) 5 MG tablet; Take 1 tablet (5 mg total) by mouth once for 1 dose.  Dispense: 1 tablet; Refill: 0  2. Elevated INR  - phytonadione (VITAMIN K) 5 MG tablet; Take 1 tablet (5 mg total) by mouth once for 1 dose.  Dispense: 1 tablet; Refill:  0  Description   INR  5.7 (too thin) Goal-2-3  Hold today's and tomorrows dose, she will take 5 mg of Vit K today,  then continue resume 2.5 mg dosing.   Recheck in [place].          I discussed the assessment and treatment plan with the patient. The patient was provided an opportunity to ask questions and all were answered. The patient agreed with the plan and demonstrated an understanding of the instructions.   The patient was advised to call back or seek an in-person evaluation if the symptoms worsen or if the condition fails to improve as  anticipated.  The above assessment and management plan was discussed with the patient. The patient verbalized understanding of and has agreed to the management plan. Patient is aware to call the clinic if symptoms persist or worsen. Patient is aware when to return to the clinic for a follow-up visit. Patient educated on when it is appropriate to go to the emergency department.   Time call ended:  1:45 pm   I provided 11 minutes of  non face-to-face time during this encounter.    Tammie Dun, FNP

## 2021-11-11 ENCOUNTER — Telehealth: Payer: Self-pay | Admitting: Family

## 2021-11-11 DIAGNOSIS — R791 Abnormal coagulation profile: Secondary | ICD-10-CM

## 2021-11-11 MED ORDER — PHYTONADIONE 5 MG PO TABS: 5.0000 mg | ORAL_TABLET | Freq: Once | ORAL | Status: DC

## 2021-11-11 NOTE — Telephone Encounter (Signed)
ok 

## 2021-11-11 NOTE — Telephone Encounter (Signed)
Patient aware and verbalized understanding. Patient states she can not come in until tomorrow

## 2021-11-11 NOTE — Telephone Encounter (Signed)
Please have patient come to our office and get medication. Orders placed for Vit K 5 mg.

## 2021-11-12 ENCOUNTER — Encounter: Payer: Self-pay | Admitting: Family

## 2021-11-12 ENCOUNTER — Emergency Department (HOSPITAL_COMMUNITY): Payer: HMO

## 2021-11-12 ENCOUNTER — Other Ambulatory Visit: Payer: Self-pay

## 2021-11-12 ENCOUNTER — Inpatient Hospital Stay (HOSPITAL_COMMUNITY)
Admission: EM | Admit: 2021-11-12 | Discharge: 2021-11-16 | DRG: 445 | Disposition: A | Discharge status: Home / Self Care | Payer: HMO | Attending: Internal Medicine | Admitting: Internal Medicine

## 2021-11-12 ENCOUNTER — Observation Stay (HOSPITAL_COMMUNITY): Payer: HMO

## 2021-11-12 ENCOUNTER — Telehealth: Payer: Self-pay | Admitting: Medical Oncology

## 2021-11-12 ENCOUNTER — Ambulatory Visit (INDEPENDENT_AMBULATORY_CARE_PROVIDER_SITE_OTHER): Payer: HMO | Admitting: Family

## 2021-11-12 ENCOUNTER — Encounter (HOSPITAL_COMMUNITY): Payer: Self-pay

## 2021-11-12 VITALS — BP 153/90 | HR 65 | Temp 95.3°F | Ht 71.0 in

## 2021-11-12 DIAGNOSIS — K5900 Constipation, unspecified: Secondary | ICD-10-CM | POA: Diagnosis present

## 2021-11-12 DIAGNOSIS — Z833 Family history of diabetes mellitus: Secondary | ICD-10-CM

## 2021-11-12 DIAGNOSIS — R791 Abnormal coagulation profile: Secondary | ICD-10-CM | POA: Insufficient documentation

## 2021-11-12 DIAGNOSIS — I10 Essential (primary) hypertension: Secondary | ICD-10-CM | POA: Diagnosis not present

## 2021-11-12 DIAGNOSIS — R17 Unspecified jaundice: Secondary | ICD-10-CM | POA: Diagnosis not present

## 2021-11-12 DIAGNOSIS — Z85038 Personal history of other malignant neoplasm of large intestine: Secondary | ICD-10-CM

## 2021-11-12 DIAGNOSIS — K750 Abscess of liver: Secondary | ICD-10-CM | POA: Diagnosis present

## 2021-11-12 DIAGNOSIS — Z91048 Other nonmedicinal substance allergy status: Secondary | ICD-10-CM

## 2021-11-12 DIAGNOSIS — Z7901 Long term (current) use of anticoagulants: Secondary | ICD-10-CM

## 2021-11-12 DIAGNOSIS — K831 Obstruction of bile duct: Secondary | ICD-10-CM

## 2021-11-12 DIAGNOSIS — C3431 Malignant neoplasm of lower lobe, right bronchus or lung: Secondary | ICD-10-CM

## 2021-11-12 DIAGNOSIS — Z881 Allergy status to other antibiotic agents status: Secondary | ICD-10-CM

## 2021-11-12 DIAGNOSIS — K8021 Calculus of gallbladder without cholecystitis with obstruction: Principal | ICD-10-CM | POA: Diagnosis present

## 2021-11-12 DIAGNOSIS — Z79899 Other long term (current) drug therapy: Secondary | ICD-10-CM

## 2021-11-12 DIAGNOSIS — Z6837 Body mass index (BMI) 37.0-37.9, adult: Secondary | ICD-10-CM

## 2021-11-12 DIAGNOSIS — E669 Obesity, unspecified: Secondary | ICD-10-CM | POA: Diagnosis present

## 2021-11-12 DIAGNOSIS — I5032 Chronic diastolic (congestive) heart failure: Secondary | ICD-10-CM | POA: Diagnosis present

## 2021-11-12 DIAGNOSIS — C229 Malignant neoplasm of liver, not specified as primary or secondary: Secondary | ICD-10-CM | POA: Diagnosis present

## 2021-11-12 DIAGNOSIS — Z853 Personal history of malignant neoplasm of breast: Secondary | ICD-10-CM

## 2021-11-12 DIAGNOSIS — N179 Acute kidney failure, unspecified: Secondary | ICD-10-CM | POA: Diagnosis present

## 2021-11-12 DIAGNOSIS — L299 Pruritus, unspecified: Secondary | ICD-10-CM | POA: Diagnosis present

## 2021-11-12 DIAGNOSIS — K760 Fatty (change of) liver, not elsewhere classified: Secondary | ICD-10-CM | POA: Diagnosis present

## 2021-11-12 DIAGNOSIS — Z87891 Personal history of nicotine dependence: Secondary | ICD-10-CM

## 2021-11-12 DIAGNOSIS — C189 Malignant neoplasm of colon, unspecified: Secondary | ICD-10-CM | POA: Diagnosis present

## 2021-11-12 DIAGNOSIS — R160 Hepatomegaly, not elsewhere classified: Secondary | ICD-10-CM | POA: Diagnosis present

## 2021-11-12 DIAGNOSIS — E785 Hyperlipidemia, unspecified: Secondary | ICD-10-CM | POA: Diagnosis present

## 2021-11-12 DIAGNOSIS — N183 Chronic kidney disease, stage 3 unspecified: Secondary | ICD-10-CM | POA: Diagnosis present

## 2021-11-12 DIAGNOSIS — Z882 Allergy status to sulfonamides status: Secondary | ICD-10-CM

## 2021-11-12 DIAGNOSIS — Z888 Allergy status to other drugs, medicaments and biological substances status: Secondary | ICD-10-CM

## 2021-11-12 DIAGNOSIS — E876 Hypokalemia: Secondary | ICD-10-CM | POA: Diagnosis present

## 2021-11-12 DIAGNOSIS — I130 Hypertensive heart and chronic kidney disease with heart failure and stage 1 through stage 4 chronic kidney disease, or unspecified chronic kidney disease: Secondary | ICD-10-CM | POA: Diagnosis present

## 2021-11-12 DIAGNOSIS — Z20822 Contact with and (suspected) exposure to covid-19: Secondary | ICD-10-CM | POA: Diagnosis present

## 2021-11-12 DIAGNOSIS — K219 Gastro-esophageal reflux disease without esophagitis: Secondary | ICD-10-CM | POA: Diagnosis present

## 2021-11-12 DIAGNOSIS — I480 Paroxysmal atrial fibrillation: Secondary | ICD-10-CM | POA: Diagnosis present

## 2021-11-12 DIAGNOSIS — Z8249 Family history of ischemic heart disease and other diseases of the circulatory system: Secondary | ICD-10-CM

## 2021-11-12 DIAGNOSIS — Z8673 Personal history of transient ischemic attack (TIA), and cerebral infarction without residual deficits: Secondary | ICD-10-CM

## 2021-11-12 DIAGNOSIS — C801 Malignant (primary) neoplasm, unspecified: Secondary | ICD-10-CM | POA: Diagnosis present

## 2021-11-12 DIAGNOSIS — N1832 Chronic kidney disease, stage 3b: Secondary | ICD-10-CM | POA: Diagnosis present

## 2021-11-12 DIAGNOSIS — K7689 Other specified diseases of liver: Secondary | ICD-10-CM

## 2021-11-12 DIAGNOSIS — Z85118 Personal history of other malignant neoplasm of bronchus and lung: Secondary | ICD-10-CM

## 2021-11-12 DIAGNOSIS — J45909 Unspecified asthma, uncomplicated: Secondary | ICD-10-CM | POA: Diagnosis present

## 2021-11-12 DIAGNOSIS — C787 Secondary malignant neoplasm of liver and intrahepatic bile duct: Secondary | ICD-10-CM | POA: Diagnosis present

## 2021-11-12 DIAGNOSIS — Z87442 Personal history of urinary calculi: Secondary | ICD-10-CM

## 2021-11-12 DIAGNOSIS — Z808 Family history of malignant neoplasm of other organs or systems: Secondary | ICD-10-CM

## 2021-11-12 DIAGNOSIS — J4489 Other specified chronic obstructive pulmonary disease: Secondary | ICD-10-CM | POA: Diagnosis present

## 2021-11-12 DIAGNOSIS — J449 Chronic obstructive pulmonary disease, unspecified: Secondary | ICD-10-CM | POA: Diagnosis present

## 2021-11-12 DIAGNOSIS — I4891 Unspecified atrial fibrillation: Secondary | ICD-10-CM | POA: Diagnosis present

## 2021-11-12 DIAGNOSIS — R7401 Elevation of levels of liver transaminase levels: Secondary | ICD-10-CM | POA: Diagnosis present

## 2021-11-12 DIAGNOSIS — Z91041 Radiographic dye allergy status: Secondary | ICD-10-CM

## 2021-11-12 LAB — CBC WITH DIFFERENTIAL/PLATELET
Abs Immature Granulocytes: 0.02 10*3/uL (ref 0.00–0.07)
Basophils Absolute: 0.1 10*3/uL (ref 0.0–0.1)
Basophils Relative: 1 %
Eosinophils Absolute: 0.3 10*3/uL (ref 0.0–0.5)
Eosinophils Relative: 4 %
HCT: 44 % (ref 36.0–46.0)
Hemoglobin: 14.2 g/dL (ref 12.0–15.0)
Immature Granulocytes: 0 %
Lymphocytes Relative: 14 %
Lymphs Abs: 0.9 10*3/uL (ref 0.7–4.0)
MCH: 27.4 pg (ref 26.0–34.0)
MCHC: 32.3 g/dL (ref 30.0–36.0)
MCV: 84.9 fL (ref 80.0–100.0)
Monocytes Absolute: 0.7 10*3/uL (ref 0.1–1.0)
Monocytes Relative: 10 %
Neutro Abs: 4.7 10*3/uL (ref 1.7–7.7)
Neutrophils Relative %: 71 %
Platelets: 209 10*3/uL (ref 150–400)
RBC: 5.18 MIL/uL — ABNORMAL HIGH (ref 3.87–5.11)
RDW: 17.2 % — ABNORMAL HIGH (ref 11.5–15.5)
WBC: 6.6 10*3/uL (ref 4.0–10.5)
nRBC: 0 % (ref 0.0–0.2)

## 2021-11-12 LAB — PROTIME-INR
INR: 3.5 — ABNORMAL HIGH (ref 0.8–1.2)
Prothrombin Time: 35.3 seconds — ABNORMAL HIGH (ref 11.4–15.2)

## 2021-11-12 LAB — COMPREHENSIVE METABOLIC PANEL
ALT: 105 U/L — ABNORMAL HIGH (ref 0–44)
AST: 100 U/L — ABNORMAL HIGH (ref 15–41)
Albumin: 3.3 g/dL — ABNORMAL LOW (ref 3.5–5.0)
Alkaline Phosphatase: 275 U/L — ABNORMAL HIGH (ref 38–126)
Anion gap: 13 (ref 5–15)
BUN: 49 mg/dL — ABNORMAL HIGH (ref 8–23)
CO2: 22 mmol/L (ref 22–32)
Calcium: 9.1 mg/dL (ref 8.9–10.3)
Chloride: 105 mmol/L (ref 98–111)
Creatinine, Ser: 2.12 mg/dL — ABNORMAL HIGH (ref 0.44–1.00)
GFR, Estimated: 23 mL/min — ABNORMAL LOW (ref >60)
Glucose, Bld: 114 mg/dL — ABNORMAL HIGH (ref 70–99)
Potassium: 3 mmol/L — ABNORMAL LOW (ref 3.5–5.1)
Sodium: 140 mmol/L (ref 135–145)
Total Bilirubin: 11.9 mg/dL — ABNORMAL HIGH (ref 0.3–1.2)
Total Protein: 7.5 g/dL (ref 6.5–8.1)

## 2021-11-12 LAB — URINALYSIS, COMPLETE
Glucose, UA: NEGATIVE
Ketones, UA: NEGATIVE
Nitrite, UA: POSITIVE — AB
Protein,UA: NEGATIVE
Specific Gravity, UA: 1.02 (ref 1.005–1.030)
Urobilinogen, Ur: 0.2 mg/dL (ref 0.2–1.0)
pH, UA: 5 (ref 5.0–7.5)

## 2021-11-12 LAB — MICROSCOPIC EXAMINATION
RBC, Urine: NONE SEEN /hpf (ref 0–2)
Renal Epithel, UA: NONE SEEN /hpf

## 2021-11-12 LAB — COAGUCHEK XS/INR WAIVED
INR: 4.7 — ABNORMAL HIGH (ref 0.9–1.1)
Prothrombin Time: 56 s

## 2021-11-12 LAB — RESP PANEL BY RT-PCR (FLU A&B, COVID) ARPGX2
Influenza A by PCR: NEGATIVE
Influenza B by PCR: NEGATIVE
SARS Coronavirus 2 by RT PCR: NEGATIVE

## 2021-11-12 LAB — LIPASE, BLOOD: Lipase: 51 U/L (ref 11–51)

## 2021-11-12 MED ORDER — ACETAMINOPHEN 325 MG PO TABS: 650.0000 mg | ORAL_TABLET | Freq: Four times a day (QID) | ORAL | Status: DC | PRN

## 2021-11-12 MED ORDER — POTASSIUM CHLORIDE CRYS ER 20 MEQ PO TBCR
40.0000 meq | EXTENDED_RELEASE_TABLET | Freq: Two times a day (BID) | ORAL | Status: AC
  Administered 2021-11-12 – 2021-11-13 (×2): 40 meq via ORAL
  Filled 2021-11-12 (×2): qty 2

## 2021-11-12 MED ORDER — ALBUTEROL SULFATE (2.5 MG/3ML) 0.083% IN NEBU
2.5000 mg | INHALATION_SOLUTION | Freq: Four times a day (QID) | RESPIRATORY_TRACT | Status: DC | PRN
  Administered 2021-11-15: 2.5 mg via RESPIRATORY_TRACT
  Filled 2021-11-12: qty 3

## 2021-11-12 MED ORDER — GADOBUTROL 1 MMOL/ML IV SOLN
10.0000 mL | Freq: Once | INTRAVENOUS | Status: AC | PRN
  Administered 2021-11-12: 23:00:00 10 mL via INTRAVENOUS

## 2021-11-12 MED ORDER — AMLODIPINE BESYLATE 10 MG PO TABS: 10.0000 mg | ORAL_TABLET | Freq: Every day | ORAL | Status: DC

## 2021-11-12 MED ORDER — SODIUM CHLORIDE 0.9% FLUSH
3.0000 mL | Freq: Two times a day (BID) | INTRAVENOUS | Status: DC
  Administered 2021-11-12 – 2021-11-16 (×6): 3 mL via INTRAVENOUS

## 2021-11-12 MED ORDER — SIMVASTATIN 10 MG PO TABS
10.0000 mg | ORAL_TABLET | Freq: Every day | ORAL | Status: DC
  Administered 2021-11-13: 10 mg via ORAL
  Filled 2021-11-12: qty 1

## 2021-11-12 MED ORDER — ACETAMINOPHEN 650 MG RE SUPP: 650.0000 mg | Freq: Four times a day (QID) | RECTAL | Status: DC | PRN
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
## 2021-11-12 NOTE — Patient Instructions (Signed)
Jaundice, Adult Jaundice is when the skin, the whites of the eyes, and the lining of the mouth and nose (mucous membranes) turn a yellowish color. This is caused by a substance that forms when red blood cells break down (bilirubin). Jaundice is caused by having too much bilirubin in the blood (hyperbilirubinemia). Bilirubin is processed by the liver. Jaundice can be a sign that the liver or the bile system is not working properly. The bile system is made up of the liver, the gallbladder, and the bile ducts. They work together to make, store, and move bile. What are the causes? This condition is caused by a buildup of bilirubin in the blood. An increase in the bilirubin level in the blood can be caused by: Gallstones, if they block the bile ducts. Alcoholic liver disease. Other liver diseases, such as cirrhosis. Cirrhosis is scarring of the liver. Certain cancers that may block the bile ducts, such as pancreatic cancer. Certain infections, such as viral hepatitis. Certain genetic syndromes, such as Gilbert syndrome. Certain medicines, such as acetaminophen, if too much is used. What are the signs or symptoms? Symptoms of this condition include: Yellow color to the skin, the whites of the eyes, or the mucous membranes. Dark brown urine. Light or clay-colored stools (feces). Itchy skin. How is this diagnosed? This condition is diagnosed with medical history, physical exam, blood tests, and imaging tests. You may have additional tests to check for other causes of increased bilirubin levels in your blood or to rule out other possible causes. How is this treated? Treatment for this condition depends on the cause of your jaundice and other underlying conditions. Treatment may include: Stopping the use of certain medicines. Getting fluids through an IV. Taking medicines or using lotions to treat itchy skin. Having surgery, if there is blockage of the bile ducts. Follow these instructions at  home:  Take over-the-counter and prescription medicines only as told by your health care provider. Use skin lotion to relieve itching. Drink enough fluid to keep your urine pale yellow. Do not drink alcohol. Keep all follow-up visits. This is important. Contact a health care provider if: You have a fever. You have swelling or pain in the abdomen. Get help right away if: Your symptoms suddenly get worse. You vomit repeatedly. You vomit blood. You become weak or confused. You have blood in your stool. You become severely dehydrated. Signs of severe dehydration include: A very dry mouth. A severe headache. A rapid, weak pulse. Rapid breathing. Blue lips. These symptoms may represent a serious problem that is an emergency. Do not wait to see if the symptoms will go away. Get medical help right away. Call your local emergency services (911 in the [place] Do not drive yourself to the hospital. Summary Jaundice is a yellowish discoloration of the skin, the whites of the eyes, and the mucous membranes. Jaundice can be a sign that the liver or the bile system is not working properly. Symptoms include a yellow color to the skin or eyes, dark urine, and a change in stool color. Your health care provider will need to do further testing, including blood tests and imaging studies. Get help right away if your symptoms suddenly get worse, you become weak and confused, you vomit, you have blood in your stool, you have a severe headache, or you become dehydrated. This information is not intended to replace advice given to you by your health care provider. Make sure you discuss any questions you have with your health care  provider. Document Revised: 03/26/2021 Document Reviewed: 03/26/2021 Elsevier Patient Education  [place].

## 2021-11-12 NOTE — ED Provider Notes (Signed)
Emergency Medicine Provider Triage Evaluation Note  Tammie [name] , a 80 y.o. female  was evaluated in triage.  Pt complains of jaundice. Pt sent by PCP for new onset juandice, did have an elevated INR about a week ago , but had not noticed jaudice at home. Does report she has been having diarrhea and fatigue. Currently followed by Dr, Julien Nordmann with oncology with hx of breast cancer, lung cancer and colon cancer. 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  Appropriate orders placed.  [name] was informed that the remainder of the evaluation will be completed by another provider, this initial triage assessment does not replace that evaluation, and the importance of remaining in the ED until their evaluation is complete.     Jacqlyn Larsen, PA-C 11/12/21 1658    Lucrezia Starch, MD 11/12/21 1740

## 2021-11-12 NOTE — Progress Notes (Signed)
Subjective:    Patient ID: Tammie [name], female    DOB: 05-29-1941, 80 y.o.   MRN: [id number]  Chief Complaint  Patient presents with   Coagulation Disorder   Jaundice   Pt presents to the office today to follow up on an at home elevated INR. However, once she presents she is jaundice. She currently has left breast cancer. She is currently taking estrogen blocker.   She had a CT abdomen on 08/04/21 that showed, "1. Subsolid nodule in the posterior right lower lobe, stable from 02/02/2021 but slightly increased in size from 06/28/2019. Finding is worrisome for indolent adenocarcinoma. 2. Otherwise, no evidence of metastatic disease. 3. Cholelithiasis. 4. Bilateral renal stones including a stone in the right renal pelvis. No hydronephrosis. 5. Difficult to exclude rectal wall thickening. This area is under distended. 6. Aortic atherosclerosis (ICD10-I70.0). Coronary artery calcification." Hypertension This is a chronic problem. The current episode started more than 1 year ago. The problem has been waxing and waning since onset. The problem is uncontrolled. Associated symptoms include malaise/fatigue. Pertinent negatives include no peripheral edema or shortness of breath. Risk factors for coronary artery disease include dyslipidemia, obesity and sedentary lifestyle. The current treatment provides moderate improvement.     Review of Systems  Constitutional:  Positive for fatigue and malaise/fatigue.  Respiratory:  Negative for shortness of breath.   All other systems reviewed and are negative.     Objective:   Physical Exam Vitals reviewed.  Constitutional:      General: She is not in acute distress.    Appearance: She is well-developed. She is obese.  HENT:     Head: Normocephalic and atraumatic.  Eyes:     Pupils: Pupils are equal, round, and reactive to light.  Neck:     Thyroid: No thyromegaly.  Cardiovascular:     Rate and Rhythm: Normal rate and regular rhythm.      Heart sounds: Normal heart sounds. No murmur heard. Pulmonary:     Effort: Pulmonary effort is normal. No respiratory distress.     Breath sounds: Normal breath sounds. No wheezing.  Abdominal:     General: Bowel sounds are normal. There is no distension.     Palpations: Abdomen is soft.     Tenderness: There is no abdominal tenderness.  Musculoskeletal:        General: No tenderness. Normal range of motion.     Cervical back: Normal range of motion and neck supple.  Skin:    General: Skin is warm and dry.     Coloration: Skin is jaundiced.  Neurological:     Mental Status: She is alert and oriented to person, place, and time.     Cranial Nerves: No cranial nerve deficit.     Deep Tendon Reflexes: Reflexes are normal and symmetric.  Psychiatric:        Behavior: Behavior normal.        Thought Content: Thought content normal.        Judgment: Judgment normal.    BP (!) 153/90   Pulse 65   Temp (!) 95.3 F (35.2 C) (Temporal)   Ht '5\' 11"'  (1.803 m)   BMI 37.29 kg/m      Assessment & Plan:  Tammie [name] comes in today with chief complaint of Coagulation Disorder and Jaundice   Diagnosis and orders addressed:  1. Jaundice - CoaguChek XS/INR Waived - BMP8+EGFR - Hepatic function panel - Urinalysis, Complete  2. Elevated INR - CoaguChek XS/INR  Waived - BMP8+EGFR - Hepatic function panel - Urinalysis, Complete  3. Primary hypertension  4. 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 Primary adenocarcinoma of colon ([place])   6. 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 [facility])   Given new onset of Jaundice I will send to ED.  I did call her Oncologists to see if I could do stat CT and make follow up with them, but the recommend follow up in ED.  I will hold off on Vit K today and let ED adjust.   Tammie Dun, FNP

## 2021-11-12 NOTE — ED Triage Notes (Addendum)
Pt. Arrived POV for jaundice. Pt. Was sent from her primary care provider to seek further evaluation in the ED. Pt. Also reports diarrhea x 1 1/2 week
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
## 2021-11-12 NOTE — Telephone Encounter (Signed)
Pt jaundiced. Per [name] I told [place] he recommends pt go to ED.

## 2021-11-13 DIAGNOSIS — K831 Obstruction of bile duct: Secondary | ICD-10-CM | POA: Diagnosis not present

## 2021-11-13 DIAGNOSIS — Z20822 Contact with and (suspected) exposure to covid-19: Secondary | ICD-10-CM | POA: Diagnosis present

## 2021-11-13 DIAGNOSIS — Z882 Allergy status to sulfonamides status: Secondary | ICD-10-CM | POA: Diagnosis not present

## 2021-11-13 DIAGNOSIS — C801 Malignant (primary) neoplasm, unspecified: Secondary | ICD-10-CM | POA: Diagnosis not present

## 2021-11-13 DIAGNOSIS — Z515 Encounter for palliative care: Secondary | ICD-10-CM | POA: Diagnosis not present

## 2021-11-13 DIAGNOSIS — Z85118 Personal history of other malignant neoplasm of bronchus and lung: Secondary | ICD-10-CM | POA: Diagnosis not present

## 2021-11-13 DIAGNOSIS — Z6837 Body mass index (BMI) 37.0-37.9, adult: Secondary | ICD-10-CM | POA: Diagnosis not present

## 2021-11-13 DIAGNOSIS — R791 Abnormal coagulation profile: Secondary | ICD-10-CM | POA: Diagnosis present

## 2021-11-13 DIAGNOSIS — Z85038 Personal history of other malignant neoplasm of large intestine: Secondary | ICD-10-CM | POA: Diagnosis not present

## 2021-11-13 DIAGNOSIS — E876 Hypokalemia: Secondary | ICD-10-CM | POA: Diagnosis present

## 2021-11-13 DIAGNOSIS — K8021 Calculus of gallbladder without cholecystitis with obstruction: Secondary | ICD-10-CM | POA: Diagnosis present

## 2021-11-13 DIAGNOSIS — N1832 Chronic kidney disease, stage 3b: Secondary | ICD-10-CM | POA: Diagnosis present

## 2021-11-13 DIAGNOSIS — J449 Chronic obstructive pulmonary disease, unspecified: Secondary | ICD-10-CM | POA: Diagnosis present

## 2021-11-13 DIAGNOSIS — Z7189 Other specified counseling: Secondary | ICD-10-CM | POA: Diagnosis not present

## 2021-11-13 DIAGNOSIS — N179 Acute kidney failure, unspecified: Secondary | ICD-10-CM | POA: Diagnosis present

## 2021-11-13 DIAGNOSIS — E669 Obesity, unspecified: Secondary | ICD-10-CM | POA: Diagnosis present

## 2021-11-13 DIAGNOSIS — K760 Fatty (change of) liver, not elsewhere classified: Secondary | ICD-10-CM | POA: Diagnosis present

## 2021-11-13 DIAGNOSIS — C787 Secondary malignant neoplasm of liver and intrahepatic bile duct: Secondary | ICD-10-CM | POA: Diagnosis present

## 2021-11-13 DIAGNOSIS — Z7901 Long term (current) use of anticoagulants: Secondary | ICD-10-CM | POA: Diagnosis not present

## 2021-11-13 DIAGNOSIS — I130 Hypertensive heart and chronic kidney disease with heart failure and stage 1 through stage 4 chronic kidney disease, or unspecified chronic kidney disease: Secondary | ICD-10-CM | POA: Diagnosis present

## 2021-11-13 DIAGNOSIS — I480 Paroxysmal atrial fibrillation: Secondary | ICD-10-CM | POA: Diagnosis present

## 2021-11-13 DIAGNOSIS — Z8673 Personal history of transient ischemic attack (TIA), and cerebral infarction without residual deficits: Secondary | ICD-10-CM | POA: Diagnosis not present

## 2021-11-13 DIAGNOSIS — K219 Gastro-esophageal reflux disease without esophagitis: Secondary | ICD-10-CM | POA: Diagnosis present

## 2021-11-13 DIAGNOSIS — Z853 Personal history of malignant neoplasm of breast: Secondary | ICD-10-CM | POA: Diagnosis not present

## 2021-11-13 DIAGNOSIS — R160 Hepatomegaly, not elsewhere classified: Secondary | ICD-10-CM | POA: Diagnosis present

## 2021-11-13 DIAGNOSIS — Z888 Allergy status to other drugs, medicaments and biological substances status: Secondary | ICD-10-CM | POA: Diagnosis not present

## 2021-11-13 DIAGNOSIS — E785 Hyperlipidemia, unspecified: Secondary | ICD-10-CM | POA: Diagnosis present

## 2021-11-13 DIAGNOSIS — C229 Malignant neoplasm of liver, not specified as primary or secondary: Secondary | ICD-10-CM | POA: Diagnosis not present

## 2021-11-13 DIAGNOSIS — I5032 Chronic diastolic (congestive) heart failure: Secondary | ICD-10-CM | POA: Diagnosis present

## 2021-11-13 LAB — COMPREHENSIVE METABOLIC PANEL
ALT: 86 U/L — ABNORMAL HIGH (ref 0–44)
AST: 82 U/L — ABNORMAL HIGH (ref 15–41)
Albumin: 3.1 g/dL — ABNORMAL LOW (ref 3.5–5.0)
Alkaline Phosphatase: 263 U/L — ABNORMAL HIGH (ref 38–126)
Anion gap: 11 (ref 5–15)
BUN: 48 mg/dL — ABNORMAL HIGH (ref 8–23)
CO2: 23 mmol/L (ref 22–32)
Calcium: 8.6 mg/dL — ABNORMAL LOW (ref 8.9–10.3)
Chloride: 106 mmol/L (ref 98–111)
Creatinine, Ser: 1.92 mg/dL — ABNORMAL HIGH (ref 0.44–1.00)
GFR, Estimated: 26 mL/min — ABNORMAL LOW (ref >60)
Glucose, Bld: 124 mg/dL — ABNORMAL HIGH (ref 70–99)
Potassium: 3 mmol/L — ABNORMAL LOW (ref 3.5–5.1)
Sodium: 140 mmol/L (ref 135–145)
Total Bilirubin: 10.5 mg/dL — ABNORMAL HIGH (ref 0.3–1.2)
Total Protein: 6.6 g/dL (ref 6.5–8.1)

## 2021-11-13 LAB — BMP8+EGFR
BUN/Creatinine Ratio: 17 (ref 12–28)
BUN: 41 mg/dL — ABNORMAL HIGH (ref 8–27)
CO2: 22 mmol/L (ref 20–29)
Calcium: 9.2 mg/dL (ref 8.7–10.3)
Chloride: 103 mmol/L (ref 96–106)
Creatinine, Ser: 2.38 mg/dL — ABNORMAL HIGH (ref 0.57–1.00)
Glucose: 110 mg/dL — ABNORMAL HIGH (ref 70–99)
Potassium: 3.5 mmol/L (ref 3.5–5.2)
Sodium: 142 mmol/L (ref 134–144)
eGFR: 20 mL/min/1.73 — ABNORMAL LOW

## 2021-11-13 LAB — PROTIME-INR
INR: 3.2 — ABNORMAL HIGH (ref 0.8–1.2)
Prothrombin Time: 32.5 seconds — ABNORMAL HIGH (ref 11.4–15.2)

## 2021-11-13 LAB — CBC
HCT: 40.8 % (ref 36.0–46.0)
Hemoglobin: 13.1 g/dL (ref 12.0–15.0)
MCH: 27.1 pg (ref 26.0–34.0)
MCHC: 32.1 g/dL (ref 30.0–36.0)
MCV: 84.3 fL (ref 80.0–100.0)
Platelets: 174 10*3/uL (ref 150–400)
RBC: 4.84 MIL/uL (ref 3.87–5.11)
RDW: 17.1 % — ABNORMAL HIGH (ref 11.5–15.5)
WBC: 5.6 10*3/uL (ref 4.0–10.5)
nRBC: 0 % (ref 0.0–0.2)

## 2021-11-13 LAB — HEPATIC FUNCTION PANEL
ALT: 115 IU/L — ABNORMAL HIGH (ref 0–32)
AST: 113 IU/L — ABNORMAL HIGH (ref 0–40)
Albumin: 4 g/dL (ref 3.7–4.7)
Alkaline Phosphatase: 370 IU/L — ABNORMAL HIGH (ref 44–121)
Bilirubin Total: 11.1 mg/dL — ABNORMAL HIGH (ref 0.0–1.2)
Bilirubin, Direct: 9.51 mg/dL — ABNORMAL HIGH (ref 0.00–0.40)
Total Protein: 6.9 g/dL (ref 6.0–8.5)

## 2021-11-13 LAB — MAGNESIUM: Magnesium: 2 mg/dL (ref 1.7–2.4)

## 2021-11-13 MED ORDER — PHYTONADIONE 5 MG PO TABS
5.0000 mg | ORAL_TABLET | ORAL | Status: AC
  Administered 2021-11-13: 5 mg via ORAL
  Filled 2021-11-13: qty 1

## 2021-11-13 MED ORDER — BOOST / RESOURCE BREEZE PO LIQD CUSTOM
1.0000 | Freq: Three times a day (TID) | ORAL | Status: DC
  Administered 2021-11-13 – 2021-11-16 (×8): 1 via ORAL

## 2021-11-13 MED ORDER — SODIUM CHLORIDE 0.9 % IV SOLN: INTRAVENOUS | Status: DC

## 2021-11-13 MED ORDER — PANTOPRAZOLE SODIUM 40 MG PO TBEC
40.0000 mg | DELAYED_RELEASE_TABLET | Freq: Every day | ORAL | Status: DC
  Administered 2021-11-13 – 2021-11-16 (×4): 40 mg via ORAL
  Filled 2021-11-13 (×4): qty 1

## 2021-11-13 MED ORDER — LETROZOLE 2.5 MG PO TABS
2.5000 mg | ORAL_TABLET | Freq: Every day | ORAL | Status: DC
  Administered 2021-11-13 – 2021-11-16 (×4): 2.5 mg via ORAL
  Filled 2021-11-13 (×4): qty 1

## 2021-11-13 MED ORDER — ADULT MULTIVITAMIN W/MINERALS CH
1.0000 | ORAL_TABLET | Freq: Every day | ORAL | Status: DC
  Administered 2021-11-13 – 2021-11-16 (×4): 1 via ORAL
  Filled 2021-11-13 (×4): qty 1

## 2021-11-13 MED ORDER — METOPROLOL TARTRATE 25 MG PO TABS
12.5000 mg | ORAL_TABLET | Freq: Two times a day (BID) | ORAL | Status: DC
  Administered 2021-11-13 – 2021-11-16 (×7): 12.5 mg via ORAL
  Filled 2021-11-13 (×7): qty 1

## 2021-11-13 MED ORDER — CHLORHEXIDINE GLUCONATE CLOTH 2 % EX PADS
6.0000 | MEDICATED_PAD | Freq: Every day | CUTANEOUS | Status: DC
  Administered 2021-11-13 – 2021-11-14 (×2): 6 via TOPICAL

## 2021-11-13 MED ORDER — SODIUM CHLORIDE 0.9% IV SOLUTION: Freq: Once | INTRAVENOUS | Status: DC

## 2021-11-13 MED ORDER — CHLORHEXIDINE GLUCONATE CLOTH 2 % EX PADS
6.0000 | MEDICATED_PAD | Freq: Every day | CUTANEOUS | Status: DC
  Administered 2021-11-13 – 2021-11-16 (×4): 6 via TOPICAL

## 2021-11-13 NOTE — Progress Notes (Signed)
Initial Nutrition Assessment  DOCUMENTATION CODES:   Obesity unspecified  INTERVENTION:   Boost Breeze po TID, each supplement provides 250 kcal and 9 grams of protein. MVI with minerals daily.  NUTRITION DIAGNOSIS:   Increased nutrient needs related to cancer and cancer related treatments as evidenced by estimated needs.  GOAL:   Patient will meet greater than or equal to 90% of their needs  MONITOR:   Diet advancement, PO intake, Supplement acceptance  REASON FOR ASSESSMENT:   Malnutrition Screening Tool    ASSESSMENT:   80 yo female admitted with obstructive jaundice. PMH includes R & L breast cancer, metastatic colon cancer with liver mets, lung cancer, CKD stage IIIa, CVA, CHF, asthma, HLD, A fib, HTN, anemia, COPD.  Abdominal ultrasound showed gallstones MRI findings concerning for metastatic disease, possible cholangiocarcinoma. Noted poor prognosis. Patient may need biliary stent for palliative purpose.   Unable to speak with patient at this time.  Currently on clear liquids. Meal intakes: 100% of breakfast today  Weight history reviewed.  Weight has been stable between 120-124 kg over the past 6 months.  Currently 117.3 kg 4% weight loss over the past 2 months is concerning, but not severe for the time frame.  Labs reviewed. K 3 (receiving supplementation)  Medications reviewed and include Protonix, vitamin K. IVF: NS at 75 ml/h  NUTRITION - FOCUSED PHYSICAL EXAM:  Unable to complete  Diet Order:   Diet Order             Diet NPO time specified  Diet effective midnight           Diet clear liquid Room service appropriate? Yes; Fluid consistency: Thin  Diet effective now                   EDUCATION NEEDS:   Not appropriate for education at this time  Skin:  Skin Assessment: Reviewed RN Assessment  Last BM:  11/18  Height:   Ht Readings from Last 1 Encounters:  11/12/21 5\' 10"  (1.778 m)    Weight:   Wt Readings from Last 1  Encounters:  11/12/21 117.3 kg    BMI:  Body mass index is 37.11 kg/m.  Estimated Nutritional Needs:   Kcal:  2100-2300  Protein:  115-130 gm  Fluid:  2-2.3 L   Lucas Mallow, RD, LDN, CNSC Please refer to Amion for contact information.

## 2021-11-13 NOTE — Progress Notes (Addendum)
[place] for Warfarin Indication: atrial fibrillation  Allergies  Allergen Reactions   Tape Hives   Contrast Media [Iodinated Diagnostic Agents] Hives   Iohexol Other (See Comments)     Code: HIVES, Desc: hives w/ contrast '08// better w/ benadryl    Sulfa Antibiotics Other (See Comments)    REACTION: unknown   Sulfamethoxazole-Trimethoprim Other (See Comments)    REACTION: unknown    Patient Measurements: Height: _0  (177.8 cm) Weight: 117.3 kg (258 lb 9.6 oz) IBW/kg (Calculated) : 68.5 Heparin Dosing Weight:   Vital Signs: Temp: 98.3 F (36.8 C) (11/18 0534) Temp Source: Oral (11/18 0534) BP: 128/48 (11/18 0534) Pulse Rate: 67 (11/18 0534)  Labs: Recent Labs    11/12/21 1420 11/12/21 1431 11/12/21 1813 11/13/21 0518  HGB  --   --  14.2 13.1  HCT  --   --  44.0 40.8  PLT  --   --  209 174  LABPROT  --   --  35.3* 32.5*  INR 4.7*  --  3.5* 3.2*  CREATININE  --  2.38* 2.12* 1.92*    Estimated Creatinine Clearance: 32.5 mL/min (A) (by C-G formula based on SCr of 1.92 mg/dL (H)).  Medications:  Scheduled:   amLODipine  10 mg Oral Daily   potassium chloride  40 mEq Oral BID   simvastatin  10 mg Oral q1800   sodium chloride flush  3 mL Intravenous Q12H   Assessment: 12 yoF presents to ED for evaluation of jaundice with known breast cancer, lung cancer, colon cancer, with mets to the liver.  PMH also includes warfarin for Afib.  Pharmacy is consulted to resume inpatient dosing.  INR was reportedly elevated at 5 one week ago.   PTA warfarin 2.5 mg daily except 3.75 mg PO daily on Tuesday and Wednesday. Vitamin K 31m PO given on 11/16 at a clinic visit  Today, 11/13/2021 INR remains elevated at 3.2 ( from 4.7 on admit 3.5 yesterday 11/17) CBC: Hgb and Plt WNL.   Tbili elevated at 10.5, AST 82, ALT 86, alk phos 263 - slowly decreasing  Goal of Therapy:  INR 2-3 Monitor platelets by anticoagulation protocol: Yes    Plan:  Continue to hold Warfarin today  Daily PT/INR. Monitor for signs and symptoms of bleeding.   GMinda DittoPharmD [facility] [phone]/18/2022, 8:30 AM

## 2021-11-13 NOTE — Progress Notes (Signed)
PROGRESS NOTE    Tammie [name]  NWG:[id number] DOB: [birth year] DOA: 11/12/2021 PCP: [name], FNP   Chief Complain: jaundice  Brief Narrative:  Patient is a 80 year old female with history of breast cancer, lung cancer, colon cancer with mets to liver, congestive heart failure, asthma, atrial fibrillation, CKD stage IIIa, CVA, hypertension, hyperlipidemia who presents with new onset jaundice.  She also reported some pruritus, pale-colored stool, darker urine..  On her PCPs office, she was noticed to be jaundiced, lab work showed elevated bilirubin and she was sent to the emergency department.  On presentation she was hemodynamically stable.  Lab work showed hypokalemia, AKI, elevated liver enzymes, elevated bilirubin, INR of 3.5.  Ultrasound of the abdomen showed gallstones and a prominent common bile duct with intrahepatic dilation but no clearly stone.  CT abdomen/pelvis showed fullness in the soft tissue superior to the pancreatic head concerning for pancreatic cancer versus cholangiocarcinoma.  MRI of the abdomen showed 6.2 x 5.2 x 5.7 cm heterogeneously enhancing lesion in the porta hepatis invading the medial liver and caudate lobe, obstructing the left and right hepatic ducts near the confluence with intrahepatic biliary duct dilatation suggesting metastatic disease.  Pancreatic cancer/cholangiocarcinoma less likely.  Gastroenterology has been consulted.  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  Presented with pruritus, pale stools, dark urine, jaundice.  Found to have elevated liver enzymes, bilirubin.  Lipase level normal.  Abdominal imagings as above.  Metastatic disease causing biliary obstruction.  Gastroenterology consulted, she might need biliary stent for palliative purpose.  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  Creatinine was elevated on presentation.  Improving with IV fluids.  Hypokalemia: Supplemented and being monitored  History of diastolic congestive heart failure: Last echo on 08/2020 showed EF of 60 to 65%, indeterminate diastolic parameters.  Takes Lasix at home.  Currently on hold due to AKI.  Currently she appears euvolemic.  Hypertension: Continue metoprolol, amlodipine.  Monitor blood pressure  Paroxysmal A. fib: Currently rate is controlled, currently in normal sinus rhythm.  On metoprolol.  On warfarin at home for anticoagulation.  Presents with supratherapeutic INR of 3.5.  Monitor INR.  Hopefully it will just spontaneously improve  Asthma: Currently stable.  Continue albuterol inhaler as needed  History of CVA/hyperlipidemia: On warfarin, simvastatin  History of adenocarcinoma of colon with mets to liver/breast cancer: Most likely this is the cause of the metastatic disease.  Status post Keytruda treatment.  Also on letrozole.  Goals of care: Imaging showed progression of metastatic disease causing jaundice.  Very poor prognosis.  We discussed goals of care at bedside.  She wants to remain as full code for now.  She has limited family.  She knows that she has metastatic disease and does not have good outcome. We have  consulted  palliative care for further goals of care.         DVT prophylaxis: SCD  code Status: Full Family Communication: None at bedside.   Patient says no need to call anybody, she has been updating herself. Status is: Observation    Consultants: GI  Procedures:None  Antimicrobials:  Anti-infectives (From admission, onward)    None       Subjective:  Patient seen and examined at the bedside this morning.  Hemodynamically stable.  Sitting on the chair.  Not in any kind of distress.  Looks jaundiced.  Denies any abdomen pain, nausea or vomiting.   Objective: Vitals:   11/12/21 2153 11/12/21 2326 11/12/21 2341 11/13/21 0534  BP: 140/70 137/68 121/82 (!) 128/48  Pulse: 65 61 72 67  Resp: 20 20 20 16   Temp: 97.9 F (36.6 C) 97.8 F (36.6 C) 97.7 F (36.5 C) 98.3 F (36.8 C)  TempSrc: Oral Oral Oral Oral  SpO2: 99% 98% 96% 98%  Weight:   117.3 kg   Height:   5\' 10"  (1.778 m)    No intake or output data in the 24 hours ending 11/13/21 0812 Filed Weights   11/12/21 1640 11/12/21 2341  Weight: 117.5 kg 117.3 kg    Examination:  General exam: Overall comfortable, not in distress,deconditioned ,obese HEENT: PERRL Respiratory system:  no wheezes or crackles  Cardiovascular system: S1 & S2 heard, RRR.  Gastrointestinal system: Abdomen is nondistended, soft and nontender. Central nervous system: Alert and oriented Extremities: No edema, no clubbing ,no cyanosis Skin: Icterus    Data Reviewed: I have personally reviewed following labs and imaging studies  CBC: Recent Labs  Lab 11/12/21 1813 11/13/21 0518  WBC 6.6 5.6  NEUTROABS 4.7  --   HGB 14.2 13.1  HCT 44.0 40.8  MCV 84.9 84.3  PLT 209 570   Basic Metabolic Panel: Recent Labs  Lab 11/12/21 1431 11/12/21 1812 11/12/21 1813 11/13/21 0518  NA 142  --  140 140  K 3.5  --  3.0* 3.0*  CL 103  --  105 106  CO2 22  --  22 23  GLUCOSE 110*  --  114* 124*  BUN 41*  --  49* 48*  CREATININE 2.38*  --  2.12* 1.92*  CALCIUM 9.2  --  9.1 8.6*  MG  --  2.0  --   --    GFR: Estimated Creatinine Clearance: 32.5 mL/min (A) (by C-G formula based  on SCr of 1.92 mg/dL (H)). Liver Function Tests: Recent Labs  Lab 11/12/21 1431 11/12/21 1813 11/13/21 0518  AST 113* 100* 82*  ALT 115* 105* 86*  ALKPHOS 370* 275* 263*  BILITOT 11.1* 11.9* 10.5*  PROT 6.9 7.5 6.6  ALBUMIN 4.0 3.3* 3.1*   Recent Labs  Lab 11/12/21 1813  LIPASE 51   No results for input(s): AMMONIA in the last 168 hours. Coagulation Profile: Recent Labs  Lab 11/12/21 1420 11/12/21 1813 11/13/21 0518  INR 4.7* 3.5* 3.2*   Cardiac Enzymes: No results for input(s): CKTOTAL, CKMB, CKMBINDEX, TROPONINI in the last 168 hours. BNP (last 3 results) No results for input(s): PROBNP in the last 8760 hours. HbA1C: No results for input(s): HGBA1C in the last 72 hours. CBG: No results for input(s): GLUCAP in the last 168 hours. Lipid Profile: No results for input(s): CHOL, HDL, LDLCALC, TRIG, CHOLHDL, LDLDIRECT in the last 72 hours. Thyroid Function Tests: No results for input(s): TSH, T4TOTAL, FREET4, T3FREE, THYROIDAB in the last 72 hours. Anemia  Panel: No results for input(s): VITAMINB12, FOLATE, FERRITIN, TIBC, IRON, RETICCTPCT in the last 72 hours. Sepsis Labs: No results for input(s): PROCALCITON, LATICACIDVEN in the last 168 hours.  Recent Results (from the past 240 hour(s))  Microscopic Examination     Status: Abnormal   Collection Time: 11/12/21  2:32 PM   Urine  Result Value Ref Range Status   WBC, UA 6-10 (A) 0 - 5 /hpf Final   RBC None seen 0 - 2 /hpf Final   Epithelial Cells (non renal) 0-10 0 - 10 /hpf Final   Renal Epithel, UA None seen None seen /hpf Final   Bacteria, UA Many (A) None seen/Few Final  Resp Panel by RT-PCR (Flu A&B, Covid) Nasopharyngeal Swab     Status: None   Collection Time: 11/12/21  6:13 PM   Specimen: Nasopharyngeal Swab; Nasopharyngeal(NP) swabs in vial transport medium  Result Value Ref Range Status   SARS Coronavirus 2 by RT PCR NEGATIVE NEGATIVE Final    Comment: (NOTE) SARS-CoV-2 target nucleic acids are NOT  DETECTED.  The SARS-CoV-2 RNA is generally detectable in upper respiratory specimens during the acute phase of infection. The lowest concentration of SARS-CoV-2 viral copies this assay can detect is 138 copies/mL. A negative result does not preclude SARS-Cov-2 infection and should not be used as the sole basis for treatment or other patient management decisions. A negative result may occur with  improper specimen collection/handling, submission of specimen other than nasopharyngeal swab, presence of viral mutation(s) within the areas targeted by this assay, and inadequate number of viral copies(<138 copies/mL). A negative result must be combined with clinical observations, patient history, and epidemiological information. The expected result is Negative.  Fact Sheet for Patients:  EntrepreneurPulse.com.au  Fact Sheet for Healthcare Providers:  IncredibleEmployment.be  This test is no t yet approved or cleared by the [place] FDA and  has been authorized for detection and/or diagnosis of SARS-CoV-2 by FDA under an Emergency Use Authorization (EUA). This EUA will remain  in effect (meaning this test can be used) for the duration of the COVID-19 declaration under Section 564(b)(1) of the Act, 21 U.S.C.section 360bbb-3(b)(1), unless the authorization is terminated  or revoked sooner.       Influenza A by PCR NEGATIVE NEGATIVE Final   Influenza B by PCR NEGATIVE NEGATIVE Final    Comment: (NOTE) The Xpert Xpress SARS-CoV-2/FLU/RSV plus assay is intended as an aid in the diagnosis of influenza from Nasopharyngeal swab specimens and should not be used as a sole basis for treatment. Nasal washings and aspirates are unacceptable for Xpert Xpress SARS-CoV-2/FLU/RSV testing.  Fact Sheet for Patients: EntrepreneurPulse.com.au  Fact Sheet for Healthcare Providers: IncredibleEmployment.be  This test is not yet  approved or cleared by the [place] FDA and has been authorized for detection and/or diagnosis of SARS-CoV-2 by FDA under an Emergency Use Authorization (EUA). This EUA will remain in effect (meaning this test can be used) for the duration of the COVID-19 declaration under Section 564(b)(1) of the Act, 21 U.S.C. section 360bbb-3(b)(1), unless the authorization is terminated or revoked.  Performed at [facility], [place] [address] [place], [place] [address]          Radiology Studies: CT ABDOMEN PELVIS WO CONTRAST  Result Date: 11/12/2021 CLINICAL DATA:  Abdominal pain, biliary obstruction suspected. New onset jaundice. EXAM: CT ABDOMEN AND PELVIS WITHOUT CONTRAST TECHNIQUE: Multidetector CT imaging of the abdomen and pelvis was performed following the standard protocol without IV contrast. COMPARISON:  CT abdomen and pelvis 08/04/2021. FINDINGS: Lower chest: Subsolid right lower lobe pulmonary nodule appears unchanged with solid component measuring 9 mm. Lung bases are otherwise clear. Hepatobiliary: Gallstones are again seen. There is no pericholecystic inflammation. There is new mild to moderate intrahepatic biliary ductal dilatation. Common bile duct not well seen. Pancreas: There is soft tissue fullness in the periportal region measuring 4.7 x 2.3 cm image 2/24. This is abutting the superior margin of the pancreatic head. The pancreas otherwise appears within normal limits. There is no evidence for pancreatic ductal dilatation or surrounding inflammation. Spleen: Normal in size without focal abnormality. Adrenals/Urinary Tract: The bilateral adrenal glands are within normal limits. There is a stable 1 cm calculus in the right renal pelvis. There is also stable 5 mm calculus in the lower pole the left kidney. No ureteral calculi are seen. There is no hydronephrosis. Left renal cysts are again noted. The largest cyst measures 8.5 cm posteriorly similar to the prior  examination. Small hyperdense lesion in the right kidney is incompletely characterized and may represent a proteinaceous cyst. This is unchanged from prior examination. The bladder is within normal limits. Stomach/Bowel: Stomach is within normal limits. Appendix appears normal. No evidence of bowel wall thickening, distention, or inflammatory changes. Vascular/Lymphatic: Aortic atherosclerosis. No enlarged abdominal or pelvic lymph nodes. Reproductive: Status post hysterectomy. No adnexal masses. Other: There is no ascites or free air. There is no focal abdominal wall hernia. Musculoskeletal: Multilevel degenerative changes affect the spine. No focal osseous lesions are identified. IMPRESSION: 1. Ill-defined soft tissue fullness in the right periportal region superior to the pancreatic head. There is mild to moderate intrahepatic biliary ductal dilatation. Findings are worrisome for primary pancreatic neoplasm or cholangiocarcinoma. Further evaluation with MRI recommended. 2.  Cholelithiasis.  No additional evidence for cholecystitis. 3.   Stable left renal calculus and right renal pelvis calculus. 4. Subsolid nodule in the right lower lobe is unchanged. Metastatic disease or primary neoplasm not excluded. 5.  Aortic Atherosclerosis (ICD10-I70.0). Electronically Signed   By: Ronney Asters M.D.   On: 11/12/2021 18:50   MR 3D Recon At Scanner  Result Date: 11/13/2021 CLINICAL DATA:  Obstructive jaundice. History of breast cancer, lung cancer, and colon cancer with mets to. EXAM: MRI ABDOMEN WITHOUT AND WITH CONTRAST (INCLUDING MRCP) TECHNIQUE: Multiplanar multisequence MR imaging of the abdomen was performed both before and after the administration of intravenous contrast. Heavily T2-weighted images of the biliary and pancreatic ducts were obtained, and three-dimensional MRCP images were rendered by post processing. CONTRAST:  33mL GADAVIST GADOBUTROL 1 MMOL/ML IV SOLN COMPARISON:  Abdomen/pelvis CT 11/12/2021.  Abdomen pelvis CT 08/04/2021. FINDINGS: Lower chest: Unremarkable. Hepatobiliary: 6.2 x 5.2 x 5.7 cm heterogeneously enhancing lesion is identified in the central porta hepatis invading the medial liver and caudate lobe. Lesion restricts diffusion and is well visualized on diffusion-weighted imaging (see image 50 of series 11). This mass has been progressive over the last 2 CT scans although it was extremely subtle on those studies performed without intravenous contrast material, mimicking background liver attenuation. On today's exam, this lesion obstructs the left and right hepatic ducts at the confluence. Lesion generates substantial mass-effect on and may actually invade the main portal vein although the portal vein does appear to remain patent. No evidence for filling defect in the hepatic venous anatomy. Intrahepatic biliary duct dilatation is seen in the left and right hepatic lobes with obliteration of the common duct in the hepatoduodenal ligament. Common bile duct reconstitutes distal to  the mass and is normal diameter leading into the ampulla. Gallbladder is contracted around a 2.5 cm gallstone. Pancreas: Above described mass in the porta hepatis does not appear to arise from the pancreas by MR imaging. No dilatation of the main duct. No intraparenchymal cyst. No peripancreatic edema. Spleen:  No splenomegaly. No focal mass lesion. Adrenals/Urinary Tract: No adrenal nodule or mass. Multiple bilateral renal cysts including dominant exophytic 9.2 cm cyst upper pole left kidney. Stomach/Bowel: Stomach is unremarkable. No gastric wall thickening. No evidence of outlet obstruction. No small bowel or colonic dilatation within the visualized abdomen. Vascular/Lymphatic: No abdominal aortic aneurysm. Although difficult to assess given motion artifact, the mass lesion in the porta hepatis abuts the common hepatic artery and appears to encase the left hepatic artery. Right hepatic artery may be replaced arising  from the SMA, although this is not well visualized. See above for discussion of portal venous anatomy. Superior mesenteric vein and splenic vein are patent.No retroperitoneal lymphadenopathy. Other:  No substantial intraperitoneal free fluid. Musculoskeletal: No focal suspicious marrow enhancement within the visualized bony anatomy. IMPRESSION: 1. 6.2 x 5.2 x 5.7 cm heterogeneously enhancing lesion in the porta hepatis invading the medial liver and caudate lobe. This mass obstructs the left and right hepatic ducts near the confluence with intrahepatic biliary duct dilatation. Lesion generates substantial mass-effect on and may actually invade the main portal vein although the portal vein does appear to remain patent (assessment markedly limited due to motion artifact). MR imaging suggests that this is not a primary pancreatic neoplasm. Metastatic disease would be a distinct consideration. Peripheral cholangiocarcinoma and hepatocellular carcinoma are not excluded but felt to be less likely based on enhancement characteristics. 2. Cholelithiasis. 3. Bilateral renal cysts. Electronically Signed   By: Misty [name] M.D.   On: 11/13/2021 05:42   MR ABDOMEN MRCP W WO CONTAST  Result Date: 11/13/2021 CLINICAL DATA:  Obstructive jaundice. History of breast cancer, lung cancer, and colon cancer with mets to. EXAM: MRI ABDOMEN WITHOUT AND WITH CONTRAST (INCLUDING MRCP) TECHNIQUE: Multiplanar multisequence MR imaging of the abdomen was performed both before and after the administration of intravenous contrast. Heavily T2-weighted images of the biliary and pancreatic ducts were obtained, and three-dimensional MRCP images were rendered by post processing. CONTRAST:  32mL GADAVIST GADOBUTROL 1 MMOL/ML IV SOLN COMPARISON:  Abdomen/pelvis CT 11/12/2021. Abdomen pelvis CT 08/04/2021. FINDINGS: Lower chest: Unremarkable. Hepatobiliary: 6.2 x 5.2 x 5.7 cm heterogeneously enhancing lesion is identified in the central porta  hepatis invading the medial liver and caudate lobe. Lesion restricts diffusion and is well visualized on diffusion-weighted imaging (see image 50 of series 11). This mass has been progressive over the last 2 CT scans although it was extremely subtle on those studies performed without intravenous contrast material, mimicking background liver attenuation. On today's exam, this lesion obstructs the left and right hepatic ducts at the confluence. Lesion generates substantial mass-effect on and may actually invade the main portal vein although the portal vein does appear to remain patent. No evidence for filling defect in the hepatic venous anatomy. Intrahepatic biliary duct dilatation is seen in the left and right hepatic lobes with obliteration of the common duct in the hepatoduodenal ligament. Common bile duct reconstitutes distal to the mass and is normal diameter leading into the ampulla. Gallbladder is contracted around a 2.5 cm gallstone. Pancreas: Above described mass in the porta hepatis does not appear to arise from the pancreas by MR imaging. No dilatation of the main duct. No intraparenchymal cyst.  No peripancreatic edema. Spleen:  No splenomegaly. No focal mass lesion. Adrenals/Urinary Tract: No adrenal nodule or mass. Multiple bilateral renal cysts including dominant exophytic 9.2 cm cyst upper pole left kidney. Stomach/Bowel: Stomach is unremarkable. No gastric wall thickening. No evidence of outlet obstruction. No small bowel or colonic dilatation within the visualized abdomen. Vascular/Lymphatic: No abdominal aortic aneurysm. Although difficult to assess given motion artifact, the mass lesion in the porta hepatis abuts the common hepatic artery and appears to encase the left hepatic artery. Right hepatic artery may be replaced arising from the SMA, although this is not well visualized. See above for discussion of portal venous anatomy. Superior mesenteric vein and splenic vein are patent.No  retroperitoneal lymphadenopathy. Other:  No substantial intraperitoneal free fluid. Musculoskeletal: No focal suspicious marrow enhancement within the visualized bony anatomy. IMPRESSION: 1. 6.2 x 5.2 x 5.7 cm heterogeneously enhancing lesion in the porta hepatis invading the medial liver and caudate lobe. This mass obstructs the left and right hepatic ducts near the confluence with intrahepatic biliary duct dilatation. Lesion generates substantial mass-effect on and may actually invade the main portal vein although the portal vein does appear to remain patent (assessment markedly limited due to motion artifact). MR imaging suggests that this is not a primary pancreatic neoplasm. Metastatic disease would be a distinct consideration. Peripheral cholangiocarcinoma and hepatocellular carcinoma are not excluded but felt to be less likely based on enhancement characteristics. 2. Cholelithiasis. 3. Bilateral renal cysts. Electronically Signed   By: Misty [name] M.D.   On: 11/13/2021 05:42   US Abdomen Limited RUQ (LIVER/GB)  Result Date: 11/12/2021 CLINICAL DATA:  History of jaundice, known metastatic colon carcinoma EXAM: ULTRASOUND ABDOMEN LIMITED RIGHT UPPER QUADRANT COMPARISON:  CT from 08/04/2021 FINDINGS: Gallbladder: Cholelithiasis is identified without wall thickening or pericholecystic fluid. The overall appearance is similar seen on prior CT examination. Common bile duct: Diameter: 11.3 mm. Liver: Increased in echogenicity without focal mass. Portal vein is patent on color Doppler imaging with normal direction of blood flow towards the liver. Biliary ductal dilatation is noted. Other: None. IMPRESSION: Cholelithiasis without complicating factors. Prominent common bile duct and intrahepatic dilatation without definitive stone. This is suggestive of distal common bile duct stone and will be better evaluated on upcoming CT examination. Fatty liver Electronically Signed   By: Inez Catalina M.D.   On: 11/12/2021  18:26        Scheduled Meds:  amLODipine  10 mg Oral Daily   potassium chloride  40 mEq Oral BID   simvastatin  10 mg Oral q1800   sodium chloride flush  3 mL Intravenous Q12H   Continuous Infusions:   LOS: 0 days    Time spent: More than 50% of that time was spent in counseling and/or coordination of care.      Shelly Coss, MD [facility] P11/18/2022, 8:12 AM

## 2021-11-13 NOTE — Plan of Care (Signed)
  Problem: Education: Goal: Knowledge of General Education information will improve Description: Including pain rating scale, medication(s)/side effects and non-pharmacologic comfort measures Outcome: Progressing   Problem: Clinical Measurements: Goal: Ability to maintain clinical measurements within normal limits will improve Outcome: Progressing Goal: Will remain free from infection Outcome: Progressing Goal: Diagnostic test results will improve Outcome: Progressing Goal: Respiratory complications will improve Outcome: Progressing   Problem: Activity: Goal: Risk for activity intolerance will decrease Outcome: Progressing   Problem: Nutrition: Goal: Adequate nutrition will be maintained Outcome: Progressing   Problem: Coping: Goal: Level of anxiety will decrease Outcome: Progressing   Problem: Elimination: Goal: Will not experience complications related to bowel motility Outcome: Progressing Goal: Will not experience complications related to urinary retention Outcome: Progressing   Problem: Pain Managment: Goal: General experience of comfort will improve Outcome: Progressing   Problem: Safety: Goal: Ability to remain free from injury will improve Outcome: Progressing   Problem: Skin Integrity: Goal: Risk for impaired skin integrity will decrease Outcome: Progressing

## 2021-11-13 NOTE — H&P (View-Only) (Signed)
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 anatomy. IMPRESSION: 1. 6.2 x 5.2 x 5.7 cm heterogeneously enhancing lesion in the porta hepatis invading the medial liver and caudate lobe. This mass obstructs the left and right hepatic ducts near the confluence with intrahepatic biliary duct dilatation. Lesion generates substantial mass-effect on and may actually invade the main portal vein although the portal vein does appear to remain patent (assessment markedly limited due to motion artifact). MR imaging suggests that this is not a primary pancreatic neoplasm. Metastatic disease would be a distinct consideration. Peripheral cholangiocarcinoma and hepatocellular carcinoma are not excluded but felt to be less likely based on enhancement characteristics. 2. Cholelithiasis. 3. Bilateral renal cysts. Electronically Signed   By: Misty [name] M.D.   On:  11/13/2021 05:42   MR ABDOMEN MRCP W WO CONTAST  Result Date: 11/13/2021 CLINICAL DATA:  Obstructive jaundice. History of breast cancer, lung cancer, and colon cancer with mets to. EXAM: MRI ABDOMEN WITHOUT AND WITH CONTRAST (INCLUDING MRCP) TECHNIQUE: Multiplanar multisequence MR imaging of the abdomen was performed both before and after the administration of intravenous contrast. Heavily T2-weighted images of the biliary and pancreatic ducts were obtained, and three-dimensional MRCP images were rendered by post processing. CONTRAST:  57m GADAVIST GADOBUTROL 1 MMOL/ML IV SOLN COMPARISON:  Abdomen/pelvis CT 11/12/2021. Abdomen pelvis CT 08/04/2021. FINDINGS: Lower chest: Unremarkable. Hepatobiliary: 6.2 x 5.2 x 5.7 cm heterogeneously enhancing lesion is identified in the central porta hepatis invading the medial liver and caudate lobe. Lesion restricts diffusion and is well visualized on diffusion-weighted imaging (see image 50 of series 11). This mass has been progressive over the last 2 CT scans although it was extremely subtle on those studies performed without intravenous contrast material, mimicking background liver attenuation. On today's exam, this lesion obstructs the left and right hepatic ducts at the confluence. Lesion generates substantial mass-effect on and may actually invade the main portal vein although the portal vein does appear to remain patent. No evidence for filling defect in the hepatic venous anatomy. Intrahepatic biliary duct dilatation is seen in the left and right hepatic lobes with obliteration of the common duct in the hepatoduodenal ligament. Common bile duct reconstitutes distal to the mass and is normal diameter leading into the ampulla. Gallbladder is contracted around a 2.5 cm gallstone. Pancreas: Above described mass in the porta hepatis does not appear to arise from the pancreas by MR imaging. No dilatation of the main duct. No intraparenchymal cyst. No peripancreatic edema.  Spleen:  No splenomegaly. No focal mass lesion. Adrenals/Urinary Tract: No adrenal nodule or mass. Multiple bilateral renal cysts including dominant exophytic 9.2 cm cyst upper pole left kidney. Stomach/Bowel: Stomach is unremarkable. No gastric wall thickening. No evidence of outlet obstruction. No small bowel or colonic dilatation within the visualized abdomen. Vascular/Lymphatic: No abdominal aortic aneurysm. Although difficult to assess given motion artifact, the mass lesion in the porta hepatis abuts the common hepatic artery and appears to encase the left hepatic artery. Right hepatic artery may be replaced arising from the SMA, although this is not well visualized. See above for discussion of portal venous anatomy. Superior mesenteric vein and splenic vein are patent.No retroperitoneal lymphadenopathy. Other:  No substantial intraperitoneal free fluid. Musculoskeletal: No focal suspicious marrow enhancement within  the visualized bony anatomy. IMPRESSION: 1. 6.2 x 5.2 x 5.7 cm heterogeneously enhancing lesion in the porta hepatis invading the medial liver and caudate lobe. This mass obstructs the left and right hepatic ducts near the confluence with intrahepatic biliary duct dilatation. Lesion generates substantial mass-effect on and may actually invade the main portal vein although the portal vein does appear to remain patent (assessment markedly limited due to motion artifact). MR imaging suggests that this is not a primary pancreatic neoplasm. Metastatic disease would be a distinct consideration. Peripheral cholangiocarcinoma and hepatocellular carcinoma are not excluded but felt to be less likely based on enhancement characteristics. 2. Cholelithiasis. 3. Bilateral renal cysts. Electronically Signed   By: Misty [name] M.D.   On: 11/13/2021 05:42   US Abdomen Limited RUQ (LIVER/GB)  Result Date: 11/12/2021 CLINICAL DATA:  History of jaundice, known metastatic colon carcinoma EXAM: ULTRASOUND ABDOMEN  LIMITED RIGHT UPPER QUADRANT COMPARISON:  CT from 08/04/2021 FINDINGS: Gallbladder: Cholelithiasis is identified without wall thickening or pericholecystic fluid. The overall appearance is similar seen on prior CT examination. Common bile duct: Diameter: 11.3 mm. Liver: Increased in echogenicity without focal mass. Portal vein is patent on color Doppler imaging with normal direction of blood flow towards the liver. Biliary ductal dilatation is noted. Other: None. IMPRESSION: Cholelithiasis without complicating factors. Prominent common bile duct and intrahepatic dilatation without definitive stone. This is suggestive of distal common bile duct stone and will be better evaluated on upcoming CT examination. Fatty liver Electronically Signed   By: Inez Catalina M.D.   On: 11/12/2021 18:26    IMPRESSION/PLAN:  16) 80 year old female with a history of lung cancer x 2,  right and left breast cancer, metastatic colon cancer with metastasis who was admitted to the hospital 11/12/2021 due to obstructive jaundice with elevated LFTs and supratherapeutic INR.  RUQ sonogram identified a prominent CBD with intrahepatic dilatation.  CTAP identified a soft tissue mass in the right periportal region superior to the pancreatic head with intrahepatic biliary ductal dilatation.  Abdominal MRI/MRCP identified a 6.2 x 5.2 x 5.7 mass in the porta hepatis invading the medial liver and caudate lobe resulting in obstruction to the left and right hepatic ducts with intrahepatic biliary ductal dilatation and possible invasion into the main portal vein.  MRI findings concerning for metastatic disease, possible cholangiocarcinoma. -Clear liquid diet for now -IV fluids 75cc/hr -Dr. Ardis Hughs to further review MRI/MRCP imaging, to verify if ERCP or PTC  would be the best approach to alleviate her right and left hepatic duct obstruction -CBC, BMP, hepatic panel and INR in am -Await further recommendations from Dr. Hilarie Fredrickson and our biliary  team  2) Atrial fibrillation.  Last dose of Coumadin was Monday 11/14 at 1 AM.  INR today 3.2. -? Vitamin K   3) GERD symptoms -Pantoprazole 13m po QD  4) COPD  5) Hypokalemia. K+ 3.0.  -KCL replacement per the hospitalist   CNoralyn Pick 11/13/2021, 0:9:51

## 2021-11-13 NOTE — Consult Note (Addendum)
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 IMPRESSION: 1. 6.2 x 5.2 x 5.7 cm heterogeneously enhancing lesion in the porta hepatis invading the medial liver and caudate lobe. This mass obstructs the left and right hepatic ducts near the confluence with intrahepatic biliary duct dilatation. Lesion generates substantial mass-effect on and may actually invade the main portal vein although the portal vein does appear to remain patent (assessment markedly limited due to motion artifact). MR imaging suggests that this is not a primary pancreatic neoplasm. Metastatic disease would be a distinct consideration. Peripheral cholangiocarcinoma and hepatocellular carcinoma are not excluded but felt to be less likely based on enhancement characteristics. 2. Cholelithiasis. 3. Bilateral renal cysts. Electronically Signed   By: Misty [name] M.D.   On:  11/13/2021 05:42   MR ABDOMEN MRCP W WO CONTAST  Result Date: 11/13/2021 CLINICAL DATA:  Obstructive jaundice. History of breast cancer, lung cancer, and colon cancer with mets to. EXAM: MRI ABDOMEN WITHOUT AND WITH CONTRAST (INCLUDING MRCP) TECHNIQUE: Multiplanar multisequence MR imaging of the abdomen was performed both before and after the administration of intravenous contrast. Heavily T2-weighted images of the biliary and pancreatic ducts were obtained, and three-dimensional MRCP images were rendered by post processing. CONTRAST:  57m GADAVIST GADOBUTROL 1 MMOL/ML IV SOLN COMPARISON:  Abdomen/pelvis CT 11/12/2021. Abdomen pelvis CT 08/04/2021. FINDINGS: Lower chest: Unremarkable. Hepatobiliary: 6.2 x 5.2 x 5.7 cm heterogeneously enhancing lesion is identified in the central porta hepatis invading the medial liver and caudate lobe. Lesion restricts diffusion and is well visualized on diffusion-weighted imaging (see image 50 of series 11). This mass has been progressive over the last 2 CT scans although it was extremely subtle on those studies performed without intravenous contrast material, mimicking background liver attenuation. On today's exam, this lesion obstructs the left and right hepatic ducts at the confluence. Lesion generates substantial mass-effect on and may actually invade the main portal vein although the portal vein does appear to remain patent. No evidence for filling defect in the hepatic venous anatomy. Intrahepatic biliary duct dilatation is seen in the left and right hepatic lobes with obliteration of the common duct in the hepatoduodenal ligament. Common bile duct reconstitutes distal to the mass and is normal diameter leading into the ampulla. Gallbladder is contracted around a 2.5 cm gallstone. Pancreas: Above described mass in the porta hepatis does not appear to arise from the pancreas by MR imaging. No dilatation of the main duct. No intraparenchymal cyst. No peripancreatic edema.  Spleen:  No splenomegaly. No focal mass lesion. Adrenals/Urinary Tract: No adrenal nodule or mass. Multiple bilateral renal cysts including dominant exophytic 9.2 cm cyst upper pole left kidney. Stomach/Bowel: Stomach is unremarkable. No gastric wall thickening. No evidence of outlet obstruction. No small bowel or colonic dilatation within the visualized abdomen. Vascular/Lymphatic: No abdominal aortic aneurysm. Although difficult to assess given motion artifact, the mass lesion in the porta hepatis abuts the common hepatic artery and appears to encase the left hepatic artery. Right hepatic artery may be replaced arising from the SMA, although this is not well visualized. See above for discussion of portal venous anatomy. Superior mesenteric vein and splenic vein are patent.No retroperitoneal lymphadenopathy. Other:  No substantial intraperitoneal free fluid. Musculoskeletal: No focal suspicious marrow enhancement within  the visualized bony anatomy. IMPRESSION: 1. 6.2 x 5.2 x 5.7 cm heterogeneously enhancing lesion in the porta hepatis invading the medial liver and caudate lobe. This mass obstructs the left and right hepatic ducts near the confluence with intrahepatic biliary duct dilatation. Lesion generates substantial mass-effect on and may actually invade the main portal vein although the portal vein does appear to remain patent (assessment markedly limited due to motion artifact). MR imaging suggests that this is not a primary pancreatic neoplasm. Metastatic disease would be a distinct consideration. Peripheral cholangiocarcinoma and hepatocellular carcinoma are not excluded but felt to be less likely based on enhancement characteristics. 2. Cholelithiasis. 3. Bilateral renal cysts. Electronically Signed   By: Misty [name] M.D.   On: 11/13/2021 05:42   US Abdomen Limited RUQ (LIVER/GB)  Result Date: 11/12/2021 CLINICAL DATA:  History of jaundice, known metastatic colon carcinoma EXAM: ULTRASOUND ABDOMEN  LIMITED RIGHT UPPER QUADRANT COMPARISON:  CT from 08/04/2021 FINDINGS: Gallbladder: Cholelithiasis is identified without wall thickening or pericholecystic fluid. The overall appearance is similar seen on prior CT examination. Common bile duct: Diameter: 11.3 mm. Liver: Increased in echogenicity without focal mass. Portal vein is patent on color Doppler imaging with normal direction of blood flow towards the liver. Biliary ductal dilatation is noted. Other: None. IMPRESSION: Cholelithiasis without complicating factors. Prominent common bile duct and intrahepatic dilatation without definitive stone. This is suggestive of distal common bile duct stone and will be better evaluated on upcoming CT examination. Fatty liver Electronically Signed   By: Inez Catalina M.D.   On: 11/12/2021 18:26    IMPRESSION/PLAN:  16) 80 year old female with a history of lung cancer x 2,  right and left breast cancer, metastatic colon cancer with metastasis who was admitted to the hospital 11/12/2021 due to obstructive jaundice with elevated LFTs and supratherapeutic INR.  RUQ sonogram identified a prominent CBD with intrahepatic dilatation.  CTAP identified a soft tissue mass in the right periportal region superior to the pancreatic head with intrahepatic biliary ductal dilatation.  Abdominal MRI/MRCP identified a 6.2 x 5.2 x 5.7 mass in the porta hepatis invading the medial liver and caudate lobe resulting in obstruction to the left and right hepatic ducts with intrahepatic biliary ductal dilatation and possible invasion into the main portal vein.  MRI findings concerning for metastatic disease, possible cholangiocarcinoma. -Clear liquid diet for now -IV fluids 75cc/hr -Dr. Ardis Hughs to further review MRI/MRCP imaging, to verify if ERCP or PTC  would be the best approach to alleviate her right and left hepatic duct obstruction -CBC, BMP, hepatic panel and INR in am -Await further recommendations from Dr. Hilarie Fredrickson and our biliary  team  2) Atrial fibrillation.  Last dose of Coumadin was Monday 11/14 at 1 AM.  INR today 3.2. -? Vitamin K   3) GERD symptoms -Pantoprazole 13m po QD  4) COPD  5) Hypokalemia. K+ 3.0.  -KCL replacement per the hospitalist   CNoralyn Pick 11/13/2021, 0:9:51

## 2021-11-14 ENCOUNTER — Inpatient Hospital Stay (HOSPITAL_COMMUNITY): Payer: HMO | Admitting: Registered Nurse

## 2021-11-14 ENCOUNTER — Encounter (HOSPITAL_COMMUNITY)
Admission: EM | Disposition: A | Discharge status: Home / Self Care | Payer: Self-pay | Source: Home / Self Care | Attending: Internal Medicine

## 2021-11-14 ENCOUNTER — Encounter (HOSPITAL_COMMUNITY): Payer: Self-pay | Admitting: Internal Medicine

## 2021-11-14 ENCOUNTER — Inpatient Hospital Stay (HOSPITAL_COMMUNITY): Payer: HMO

## 2021-11-14 DIAGNOSIS — K831 Obstruction of bile duct: Secondary | ICD-10-CM | POA: Diagnosis not present

## 2021-11-14 DIAGNOSIS — C801 Malignant (primary) neoplasm, unspecified: Secondary | ICD-10-CM | POA: Diagnosis not present

## 2021-11-14 HISTORY — PX: BIOPSY: SHX5522

## 2021-11-14 HISTORY — PX: BILIARY STENT PLACEMENT: SHX5538

## 2021-11-14 HISTORY — PX: ERCP: SHX5425

## 2021-11-14 HISTORY — PX: SPHINCTEROTOMY: SHX5544

## 2021-11-14 HISTORY — PX: BILIARY BRUSHING: SHX6843

## 2021-11-14 LAB — BASIC METABOLIC PANEL
Anion gap: 10 (ref 5–15)
BUN: 37 mg/dL — ABNORMAL HIGH (ref 8–23)
CO2: 22 mmol/L (ref 22–32)
Calcium: 8.5 mg/dL — ABNORMAL LOW (ref 8.9–10.3)
Chloride: 109 mmol/L (ref 98–111)
Creatinine, Ser: 1.54 mg/dL — ABNORMAL HIGH (ref 0.44–1.00)
GFR, Estimated: 34 mL/min — ABNORMAL LOW (ref >60)
Glucose, Bld: 112 mg/dL — ABNORMAL HIGH (ref 70–99)
Potassium: 3.3 mmol/L — ABNORMAL LOW (ref 3.5–5.1)
Sodium: 141 mmol/L (ref 135–145)

## 2021-11-14 LAB — CBC WITH DIFFERENTIAL/PLATELET
Abs Immature Granulocytes: 0.02 10*3/uL (ref 0.00–0.07)
Basophils Absolute: 0.1 10*3/uL (ref 0.0–0.1)
Basophils Relative: 1 %
Eosinophils Absolute: 0.3 10*3/uL (ref 0.0–0.5)
Eosinophils Relative: 5 %
HCT: 40.9 % (ref 36.0–46.0)
Hemoglobin: 13 g/dL (ref 12.0–15.0)
Immature Granulocytes: 0 %
Lymphocytes Relative: 15 %
Lymphs Abs: 0.8 10*3/uL (ref 0.7–4.0)
MCH: 27 pg (ref 26.0–34.0)
MCHC: 31.8 g/dL (ref 30.0–36.0)
MCV: 84.9 fL (ref 80.0–100.0)
Monocytes Absolute: 0.7 10*3/uL (ref 0.1–1.0)
Monocytes Relative: 13 %
Neutro Abs: 3.4 10*3/uL (ref 1.7–7.7)
Neutrophils Relative %: 66 %
Platelets: 185 10*3/uL (ref 150–400)
RBC: 4.82 MIL/uL (ref 3.87–5.11)
RDW: 17.4 % — ABNORMAL HIGH (ref 11.5–15.5)
WBC: 5.3 10*3/uL (ref 4.0–10.5)
nRBC: 0 % (ref 0.0–0.2)

## 2021-11-14 LAB — HEPATIC FUNCTION PANEL
ALT: 90 U/L — ABNORMAL HIGH (ref 0–44)
AST: 95 U/L — ABNORMAL HIGH (ref 15–41)
Albumin: 2.9 g/dL — ABNORMAL LOW (ref 3.5–5.0)
Alkaline Phosphatase: 264 U/L — ABNORMAL HIGH (ref 38–126)
Bilirubin, Direct: 7.7 mg/dL — ABNORMAL HIGH (ref 0.0–0.2)
Indirect Bilirubin: 4.3 mg/dL — ABNORMAL HIGH (ref 0.3–0.9)
Total Bilirubin: 12 mg/dL — ABNORMAL HIGH (ref 0.3–1.2)
Total Protein: 6.4 g/dL — ABNORMAL LOW (ref 6.5–8.1)

## 2021-11-14 LAB — TYPE AND SCREEN
ABO/RH(D): O POS
Antibody Screen: NEGATIVE

## 2021-11-14 LAB — PROTIME-INR
INR: 2.6 — ABNORMAL HIGH (ref 0.8–1.2)
Prothrombin Time: 28.1 seconds — ABNORMAL HIGH (ref 11.4–15.2)

## 2021-11-14 SURGERY — ERCP, WITH INTERVENTION IF INDICATED
Anesthesia: General

## 2021-11-14 MED ORDER — LACTATED RINGERS IV SOLN: INTRAVENOUS | Status: DC | PRN

## 2021-11-14 MED ORDER — EPHEDRINE SULFATE-NACL 50-0.9 MG/10ML-% IV SOSY
PREFILLED_SYRINGE | INTRAVENOUS | Status: DC | PRN
  Administered 2021-11-14 (×2): 5 mg via INTRAVENOUS

## 2021-11-14 MED ORDER — CIPROFLOXACIN IN D5W 400 MG/200ML IV SOLN
INTRAVENOUS | Status: AC
  Filled 2021-11-14: qty 200

## 2021-11-14 MED ORDER — LIDOCAINE 2% (20 MG/ML) 5 ML SYRINGE
INTRAMUSCULAR | Status: DC | PRN
  Administered 2021-11-14: 60 mg via INTRAVENOUS

## 2021-11-14 MED ORDER — SUGAMMADEX SODIUM 500 MG/5ML IV SOLN
INTRAVENOUS | Status: DC | PRN
  Administered 2021-11-14: 300 mg via INTRAVENOUS

## 2021-11-14 MED ORDER — SODIUM CHLORIDE 0.9 % IV SOLN
INTRAVENOUS | Status: DC | PRN
  Administered 2021-11-14: 20 mL

## 2021-11-14 MED ORDER — POTASSIUM CHLORIDE 10 MEQ/100ML IV SOLN
10.0000 meq | INTRAVENOUS | Status: AC
  Administered 2021-11-14 (×5): 10 meq via INTRAVENOUS
  Filled 2021-11-14 (×3): qty 100

## 2021-11-14 MED ORDER — GLUCAGON HCL RDNA (DIAGNOSTIC) 1 MG IJ SOLR
INTRAMUSCULAR | Status: AC
  Filled 2021-11-14: qty 1

## 2021-11-14 MED ORDER — PROPOFOL 10 MG/ML IV BOLUS
INTRAVENOUS | Status: DC | PRN
  Administered 2021-11-14: 120 mg via INTRAVENOUS

## 2021-11-14 MED ORDER — INDOMETHACIN 50 MG RE SUPP
RECTAL | Status: DC | PRN
  Administered 2021-11-14: 100 mg via RECTAL

## 2021-11-14 MED ORDER — DIPHENHYDRAMINE HCL 50 MG/ML IJ SOLN
INTRAMUSCULAR | Status: AC
  Filled 2021-11-14: qty 1

## 2021-11-14 MED ORDER — PHENYLEPHRINE HCL-NACL 20-0.9 MG/250ML-% IV SOLN
INTRAVENOUS | Status: DC | PRN
  Administered 2021-11-14: 25 ug/min via INTRAVENOUS

## 2021-11-14 MED ORDER — DEXAMETHASONE SODIUM PHOSPHATE 10 MG/ML IJ SOLN
INTRAMUSCULAR | Status: DC | PRN
  Administered 2021-11-14: 5 mg via INTRAVENOUS

## 2021-11-14 MED ORDER — CIPROFLOXACIN IN D5W 400 MG/200ML IV SOLN
INTRAVENOUS | Status: DC | PRN
  Administered 2021-11-14: 400 mg via INTRAVENOUS

## 2021-11-14 MED ORDER — SUCCINYLCHOLINE CHLORIDE 200 MG/10ML IV SOSY
PREFILLED_SYRINGE | INTRAVENOUS | Status: DC | PRN
  Administered 2021-11-14: 120 mg via INTRAVENOUS

## 2021-11-14 MED ORDER — PHENYLEPHRINE HCL (PRESSORS) 10 MG/ML IV SOLN
INTRAVENOUS | Status: AC
  Filled 2021-11-14: qty 2

## 2021-11-14 MED ORDER — ONDANSETRON HCL 4 MG/2ML IJ SOLN
INTRAMUSCULAR | Status: DC | PRN
  Administered 2021-11-14: 4 mg via INTRAVENOUS

## 2021-11-14 MED ORDER — INDOMETHACIN 50 MG RE SUPP
RECTAL | Status: AC
  Filled 2021-11-14: qty 2

## 2021-11-14 MED ORDER — FENTANYL CITRATE (PF) 100 MCG/2ML IJ SOLN
INTRAMUSCULAR | Status: DC | PRN
  Administered 2021-11-14: 25 ug via INTRAVENOUS

## 2021-11-14 MED ORDER — GLUCAGON HCL RDNA (DIAGNOSTIC) 1 MG IJ SOLR
INTRAMUSCULAR | Status: DC | PRN
  Administered 2021-11-14: .5 mg via INTRAVENOUS

## 2021-11-14 MED ORDER — PROPOFOL 10 MG/ML IV BOLUS
INTRAVENOUS | Status: AC
  Filled 2021-11-14: qty 20

## 2021-11-14 MED ORDER — PHENYLEPHRINE 40 MCG/ML (10ML) SYRINGE FOR IV PUSH (FOR BLOOD PRESSURE SUPPORT)
PREFILLED_SYRINGE | INTRAVENOUS | Status: DC | PRN
  Administered 2021-11-14: 80 ug via INTRAVENOUS

## 2021-11-14 MED ORDER — FENTANYL CITRATE (PF) 100 MCG/2ML IJ SOLN
INTRAMUSCULAR | Status: AC
  Filled 2021-11-14: qty 2

## 2021-11-14 MED ORDER — ROCURONIUM BROMIDE 10 MG/ML (PF) SYRINGE
PREFILLED_SYRINGE | INTRAVENOUS | Status: DC | PRN
Start: 2021-11-14 — End: 2021-11-14
  Administered 2021-11-14: 30 mg via INTRAVENOUS

## 2021-11-14 NOTE — Progress Notes (Signed)
Palliative care brief note  Palliative care consult received.   Attempted to see Tammie [name] this a.m. but she is off the floor for procedure (ERCP).  Plan to see her later today or tomorrow for initial consult.  Please call for palliative specific needs in the interim.  Micheline Rough, MD Ward Team [phone]

## 2021-11-14 NOTE — Progress Notes (Signed)
Call rec'd from surgeon Dr. Ardis Hughs, indicated okay to resume FFP infusion to 125 ml/hr with pt. SBP 140-upper 150s.

## 2021-11-14 NOTE — Op Note (Signed)
[facility] Patient Name: Tammie [name] Procedure Date: 11/14/2021 MRN: [id number] Attending MD: Milus Banister , MD Date of Birth: 07-21-1941 CSN: [id number] Age: 80 Admit Type: Inpatient Procedure:                ERCP Indications:              Multiple known cancers. Currently large mass                            centrally in liver obstructing right and left                            hepatic ducts. Elevated liver tests, jaundice Providers:                Milus Banister, MD, Grace Isaac, RN, Tyna Jaksch Technician Referring MD:              Medicines:                General Anesthesia, Indomethacin 100 mg PR, Cipro                            846 mg IV Complications:            No immediate complications. Estimated blood loss:                            None Estimated Blood Loss:     Estimated blood loss: none. Procedure:                Pre-Anesthesia Assessment:                           - Prior to the procedure, a History and Physical                            was performed, and patient medications and                            allergies were reviewed. The patient's tolerance of                            previous anesthesia was also reviewed. The risks                            and benefits of the procedure and the sedation                            options and risks were discussed with the patient.                            All questions were answered, and informed consent                            was obtained.  Prior Anticoagulants: The patient has                            taken Coumadin (warfarin), last dose was 2 days                            prior to procedure. ASA Grade Assessment: IV - A                            patient with severe systemic disease that is a                            constant threat to life. After reviewing the risks                            and benefits, the patient was deemed in                             satisfactory condition to undergo the procedure.                           After obtaining informed consent, the scope was                            passed under direct vision. Throughout the                            procedure, the patient's blood pressure, pulse, and                            oxygen saturations were monitored continuously. The                            TJF-Q180V ([id number]) Olympus duodenoscope was                            introduced through the mouth, and used to inject                            contrast into and used to cannulate the bile duct.                            The ERCP was accomplished without difficulty. The                            patient tolerated the procedure well. Scope In: Scope Out: Findings:      The scout film was normal. I passed a duodenoscope to the region of a       normal appearing major papilla without detailed examination of the upper       GI tract. I used a 26 autotome to cannulate the bile duct and inject       contrast. Cholangiogram revealed 2 areas of stricture. The first was 3       to  4 cm long, distal end was located in the common hepatic duct. The       proximal end seem to terminate in the right main hepatic duct. The       second area of stricture was at what appeared to be an area of       communication between the right and left intrahepatic system, it was 1       to 1-1/2 cm long. Proximal to the each of the strictures the       intrahepatic ducts were quite dilated, up to 1 cm. The cystic duct was       patent. The gallbladder partially filled with dye and there was a       gallstone within. Distal common bile duct was nondilated and there       appeared at first to be a couple small gallstones in the distal bile       duct. I performed an adequate biliary sphincterotomy over the biliary       wire and then swept the distal duct several times. No gallstones were       removed. I think the filling  defects were probably sludge, perhaps soft       tissue. I then used a cytology brush to sample the common hepatic duct       stricture and sent it for cytology. I turned attention to the most       intrahepatic stricture first. Again this was 1 maybe 2 cm long. It       seemed to be an area of communication between the right and left hepatic       duct system or perhaps it was just a stricture involving the right       intrahepatics deeply. Regardless there were significantly dilated ducts       proximal to the stricture and I elected to stent it with a 'buried'       uncovered 4 cm long, 44mm diameter metal mesh stent that bridged the       stricture in good position. The distal end of this stent resides in the       right hepatic duct (perhaps the common hepatic duct). I then placed a       second stent which was a 12 cm long 10 [place] plastic removable stent.       This bridges the more central, distal biliary stricture in good       position. The proximal end of this plastic stent is clearly within the       right intrahepatic biliary system and the distal end extends about 1 cm       into the duodenum. Following plastic stent placement there was immediate       delivery of contrast and old bile into the duodenum. The pancreatic duct       was never cannula with a wire or injected with dye. 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                           -I placed 2 stents. A permanent uncovered metal 4cm  long 10 mm diameter stent which bridges what                            appears to be stricture either within the                            intrahepatic right system or perhaps is a                            communication between the intrahepatic right and                            the left intrahepatic system. A second, removable                            12 cm long 10  French plastic stent was placed                            bridging the more distal, 4 cm long stricture which                            involves the common hepatic duct into the right                            hepatic duct system. I also sampled this more                            distal longer stricture with a biliary cytology                            brush. Moderate Sedation:      Not Applicable - Patient had care per Anesthesia. Recommendation:           - Await pathology results.                           - Follow liver tests. Procedure Code(s):        --- Professional ---                           [id number], Endoscopic retrograde                            cholangiopancreatography (ERCP); with placement of                            endoscopic stent into biliary or pancreatic duct,                            including pre- and post-dilation and guide wire                            passage, when performed, including sphincterotomy,  when performed, each stent Diagnosis Code(s):        --- Professional ---                           K83.1, Obstruction of bile duct CPT copyright 2019 [employer]. All rights reserved. The codes documented in this report are preliminary and upon coder review may  be revised to meet current compliance requirements. Milus Banister, MD 11/14/2021 9:16:07 AM This report has been signed electronically. Number of Addenda: 0

## 2021-11-14 NOTE — Progress Notes (Signed)
Called blood bank to confirm FFP is available per MD order. S/W Ubaldo Glassing, states order needed for Type and [employer]. Provider on call notified and requested order. Alfredo Batty

## 2021-11-14 NOTE — Interval H&P Note (Signed)
History and Physical Interval Note:  11/14/2021 7:22 AM  Tammie [name]  has presented today for surgery, with the diagnosis of obstructive jaundice.  The various methods of treatment have been discussed with the patient and family. After consideration of risks, benefits and other options for treatment, the patient has consented to  Procedure(s): ENDOSCOPIC RETROGRADE CHOLANGIOPANCREATOGRAPHY (ERCP) (N/A) as a surgical intervention.  The patient's history has been reviewed, patient examined, no change in status, stable for surgery.  I have reviewed the patient's chart and labs.  Questions were answered to the patient's satisfaction.     Milus Banister

## 2021-11-14 NOTE — Plan of Care (Signed)
Pt declines any concerns this shift, no distress noted. Pt consented for blood products and verbalized understanding. Will continue to monitor pt. [place].  Problem: Education: Goal: Knowledge of General Education information will improve Description: Including pain rating scale, medication(s)/side effects and non-pharmacologic comfort measures Outcome: Progressing   Problem: Clinical Measurements: Goal: Will remain free from infection Outcome: Progressing Goal: Respiratory complications will improve Outcome: Progressing Goal: Cardiovascular complication will be avoided Outcome: Progressing   Problem: Activity: Goal: Risk for activity intolerance will decrease Outcome: Progressing

## 2021-11-14 NOTE — Progress Notes (Signed)
S/W Altha Harm in materials, no type and screen Vacutainers, states will call [facility] to obtain some and security will have to pick them up and deliver to [employer]. Neomia Dear, RN

## 2021-11-14 NOTE — Progress Notes (Signed)
PROGRESS NOTE    Tammie [name]  GMW:[id number] DOB: 13-Jan-1941 DOA: 11/12/2021 PCP: [name], FNP   Chief Complain: jaundice  Brief Narrative:  Patient is a 80 year old female with history of breast cancer, lung cancer, colon cancer with mets to liver, congestive heart failure, asthma, atrial fibrillation, CKD stage IIIa, CVA, hypertension, hyperlipidemia who presents with new onset jaundice.  She also reported some pruritus, pale-colored stool, darker urine..  On her PCPs office, she was noticed to be jaundiced, lab work showed elevated bilirubin and she was sent to the emergency department.  On presentation she was hemodynamically stable.  Lab work showed hypokalemia, AKI, elevated liver enzymes, elevated bilirubin, INR of 3.5.  Ultrasound of the abdomen showed gallstones and a prominent common bile duct with intrahepatic dilation but no clearly stone.  CT abdomen/pelvis showed fullness in the soft tissue superior to the pancreatic head concerning for pancreatic cancer versus cholangiocarcinoma.  MRI of the abdomen showed 6.2 x 5.2 x 5.7 cm heterogeneously enhancing lesion in the porta hepatis invading the medial liver and caudate lobe, obstructing the left and right hepatic ducts near the confluence with intrahepatic biliary duct dilatation suggesting metastatic disease.  Pancreatic cancer/cholangiocarcinoma less likely.  Gastroenterology has been consulted.  Underwent ERCP today with placement of 2 biliary stents.  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  Presented with pruritus, pale stools, dark urine, jaundice.  Found to have elevated liver enzymes, bilirubin.  Lipase level normal.  Abdominal imagings as above.  Metastatic disease causing biliary obstruction.  Gastroenterology consulted, she underwent ERCP today with finding of large central hepatic mass causing complicated biliary strictures.  Status post placement of 2 stents.  Biliary cytology was obtained.  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  Creatinine was elevated on presentation.  Improving with IV fluids.  Hypokalemia: Supplemented and being monitored  History of diastolic congestive heart failure: Last echo on 08/2020 showed EF of 60 to 65%, indeterminate diastolic parameters.  Takes Lasix at home.  Currently on hold due to AKI.  Currently she appears euvolemic.  Hypertension: Continue metoprolol, amlodipine.  Monitor blood pressure  Paroxysmal A. fib: Currently rate is controlled, currently in normal sinus rhythm.  On metoprolol.  On warfarin at home for anticoagulation.  Presents with supratherapeutic INR of 3.5.  Monitor INR.  INR down in the range of 2.  She is being given 2 units of  fresh frozen plasma today  Asthma: Currently stable.  Continue albuterol inhaler as needed  History of CVA/hyperlipidemia: On warfarin, simvastatin  History of adenocarcinoma of colon with mets to liver/breast cancer: Most likely this is the cause of the metastatic disease.  Status post Keytruda treatment.  Also on letrozole.  Goals of care: Imaging showed progression of metastatic disease causing jaundice.  Very  poor prognosis.  We discussed goals of care at bedside.  She wants to remain as full code for now.  She has limited family  members.  She knows that she has metastatic disease and does not have good outcome. We have  consulted palliative care for further goals of care.  Nutrition Problem: Increased nutrient needs Etiology: cancer and cancer related treatments      DVT prophylaxis: SCD  code Status: Full Family Communication: None at bedside.  Patient says no need to call anybody, she has been updating herself. Status is: Observation    Consultants: GI  Procedures:None  Antimicrobials:  Anti-infectives (From admission, onward)    None       Subjective:  Patient seen and examined at the bedside this morning.  She just came from ERCP.  She was nauseated and about to vomit.  She denies abdominal pain.   Objective: Vitals:   11/14/21 0622 11/14/21 0643 11/14/21 0722 11/14/21 0740  BP: (!) 151/69 137/66 (!) 184/91 (!) 195/86  Pulse: 66 74 77 78  Resp:    (!) 26  Temp:   98.6 F (37 C) 98.6 F (37 C)  TempSrc:   Oral   SpO2:   97%   Weight:      Height:        Intake/Output Summary (Last 24 hours) at 11/14/2021 0800 Last data filed at 11/14/2021 0729 Gross per 24 hour  Intake 2304.61 ml  Output 350 ml  Net 1954.61 ml   Filed Weights   11/12/21 1640 11/12/21 2341  Weight: 117.5 kg 117.3 kg    Examination:   General exam: Overall comfortable, not in distress, obese, deconditioned HEENT: PERRL Respiratory system:  no wheezes or crackles  Cardiovascular system: S1 & S2 heard, RRR.  Gastrointestinal system: Abdomen is nondistended, soft and nontender. Central nervous system: Alert and oriented Extremities: No edema, no clubbing ,no cyanosis Skin: Icteric  Data Reviewed: I have personally reviewed following labs and imaging studies  CBC: Recent Labs  Lab 11/12/21 1813 11/13/21 0518 11/14/21 0408  WBC 6.6 5.6 5.3  NEUTROABS 4.7  --  3.4  HGB 14.2 13.1 13.0  HCT 44.0 40.8 40.9  MCV 84.9 84.3 84.9  PLT 209 174 742   Basic Metabolic Panel: Recent Labs  Lab  11/12/21 1431 11/12/21 1812 11/12/21 1813 11/13/21 0518 11/14/21 0408  NA 142  --  140 140 141  K 3.5  --  3.0* 3.0* 3.3*  CL 103  --  105 106 109  CO2 22  --  22 23 22   GLUCOSE 110*  --  114* 124* 112*  BUN 41*  --  49* 48* 37*  CREATININE 2.38*  --  2.12* 1.92* 1.54*  CALCIUM 9.2  --  9.1 8.6* 8.5*  MG  --  2.0  --   --   --    GFR: Estimated Creatinine Clearance: 40.5 mL/min (A) (by C-G formula based on SCr of 1.54 mg/dL (H)). Liver Function Tests: Recent Labs  Lab 11/12/21 1431 11/12/21 1813 11/13/21 0518 11/14/21 0408  AST 113* 100* 82* 95*  ALT 115* 105* 86* 90*  ALKPHOS 370* 275* 263* 264*  BILITOT 11.1* 11.9* 10.5* 12.0*  PROT 6.9 7.5 6.6 6.4*  ALBUMIN 4.0 3.3* 3.1* 2.9*   Recent Labs  Lab 11/12/21 1813  LIPASE 51   No results for input(s): AMMONIA in the last 168 hours. Coagulation Profile: Recent Labs  Lab 11/12/21 1420 11/12/21 1813 11/13/21 0518 11/14/21 0408  INR  4.7* 3.5* 3.2* 2.6*   Cardiac Enzymes: No results for input(s): CKTOTAL, CKMB, CKMBINDEX, TROPONINI in the last 168 hours. BNP (last 3 results) No results for input(s): PROBNP in the last 8760 hours. HbA1C: No results for input(s): HGBA1C in the last 72 hours. CBG: No results for input(s): GLUCAP in the last 168 hours. Lipid Profile: No results for input(s): CHOL, HDL, LDLCALC, TRIG, CHOLHDL, LDLDIRECT in the last 72 hours. Thyroid Function Tests: No results for input(s): TSH, T4TOTAL, FREET4, T3FREE, THYROIDAB in the last 72 hours. Anemia Panel: No results for input(s): VITAMINB12, FOLATE, FERRITIN, TIBC, IRON, RETICCTPCT in the last 72 hours. Sepsis Labs: No results for input(s): PROCALCITON, LATICACIDVEN in the last 168 hours.  Recent Results (from the past 240 hour(s))  Microscopic Examination     Status: Abnormal   Collection Time: 11/12/21  2:32 PM   Urine  Result Value Ref Range Status   WBC, UA 6-10 (A) 0 - 5 /hpf Final   RBC None seen 0 - 2 /hpf Final   Epithelial  Cells (non renal) 0-10 0 - 10 /hpf Final   Renal Epithel, UA None seen None seen /hpf Final   Bacteria, UA Many (A) None seen/Few Final  Resp Panel by RT-PCR (Flu A&B, Covid) Nasopharyngeal Swab     Status: None   Collection Time: 11/12/21  6:13 PM   Specimen: Nasopharyngeal Swab; Nasopharyngeal(NP) swabs in vial transport medium  Result Value Ref Range Status   SARS Coronavirus 2 by RT PCR NEGATIVE NEGATIVE Final    Comment: (NOTE) SARS-CoV-2 target nucleic acids are NOT DETECTED.  The SARS-CoV-2 RNA is generally detectable in upper respiratory specimens during the acute phase of infection. The lowest concentration of SARS-CoV-2 viral copies this assay can detect is 138 copies/mL. A negative result does not preclude SARS-Cov-2 infection and should not be used as the sole basis for treatment or other patient management decisions. A negative result may occur with  improper specimen collection/handling, submission of specimen other than nasopharyngeal swab, presence of viral mutation(s) within the areas targeted by this assay, and inadequate number of viral copies(<138 copies/mL). A negative result must be combined with clinical observations, patient history, and epidemiological information. The expected result is Negative.  Fact Sheet for Patients:  EntrepreneurPulse.com.au  Fact Sheet for Healthcare Providers:  IncredibleEmployment.be  This test is no t yet approved or cleared by the [place] FDA and  has been authorized for detection and/or diagnosis of SARS-CoV-2 by FDA under an Emergency Use Authorization (EUA). This EUA will remain  in effect (meaning this test can be used) for the duration of the COVID-19 declaration under Section 564(b)(1) of the Act, 21 U.S.C.section 360bbb-3(b)(1), unless the authorization is terminated  or revoked sooner.       Influenza A by PCR NEGATIVE NEGATIVE Final   Influenza B by PCR NEGATIVE NEGATIVE  Final    Comment: (NOTE) The Xpert Xpress SARS-CoV-2/FLU/RSV plus assay is intended as an aid in the diagnosis of influenza from Nasopharyngeal swab specimens and should not be used as a sole basis for treatment. Nasal washings and aspirates are unacceptable for Xpert Xpress SARS-CoV-2/FLU/RSV testing.  Fact Sheet for Patients: EntrepreneurPulse.com.au  Fact Sheet for Healthcare Providers: IncredibleEmployment.be  This test is not yet approved or cleared by the [place] FDA and has been authorized for detection and/or diagnosis of SARS-CoV-2 by FDA under an Emergency Use Authorization (EUA). This EUA will remain in effect (meaning this test can be used) for the duration of  the COVID-19 declaration under Section 564(b)(1) of the Act, 21 U.S.C. section 360bbb-3(b)(1), unless the authorization is terminated or revoked.  Performed at [facility], [place] [address] [place], [place] [address]          Radiology Studies: CT ABDOMEN PELVIS WO CONTRAST  Result Date: 11/12/2021 CLINICAL DATA:  Abdominal pain, biliary obstruction suspected. New onset jaundice. EXAM: CT ABDOMEN AND PELVIS WITHOUT CONTRAST TECHNIQUE: Multidetector CT imaging of the abdomen and pelvis was performed following the standard protocol without IV contrast. COMPARISON:  CT abdomen and pelvis 08/04/2021. FINDINGS: Lower chest: Subsolid right lower lobe pulmonary nodule appears unchanged with solid component measuring 9 mm. Lung bases are otherwise clear. Hepatobiliary: Gallstones are again seen. There is no pericholecystic inflammation. There is new mild to moderate intrahepatic biliary ductal dilatation. Common bile duct not well seen. Pancreas: There is soft tissue fullness in the periportal region measuring 4.7 x 2.3 cm image 2/24. This is abutting the superior margin of the pancreatic head. The pancreas otherwise appears within normal limits. There is no  evidence for pancreatic ductal dilatation or surrounding inflammation. Spleen: Normal in size without focal abnormality. Adrenals/Urinary Tract: The bilateral adrenal glands are within normal limits. There is a stable 1 cm calculus in the right renal pelvis. There is also stable 5 mm calculus in the lower pole the left kidney. No ureteral calculi are seen. There is no hydronephrosis. Left renal cysts are again noted. The largest cyst measures 8.5 cm posteriorly similar to the prior examination. Small hyperdense lesion in the right kidney is incompletely characterized and may represent a proteinaceous cyst. This is unchanged from prior examination. The bladder is within normal limits. Stomach/Bowel: Stomach is within normal limits. Appendix appears normal. No evidence of bowel wall thickening, distention, or inflammatory changes. Vascular/Lymphatic: Aortic atherosclerosis. No enlarged abdominal or pelvic lymph nodes. Reproductive: Status post hysterectomy. No adnexal masses. Other: There is no ascites or free air. There is no focal abdominal wall hernia. Musculoskeletal: Multilevel degenerative changes affect the spine. No focal osseous lesions are identified. IMPRESSION: 1. Ill-defined soft tissue fullness in the right periportal region superior to the pancreatic head. There is mild to moderate intrahepatic biliary ductal dilatation. Findings are worrisome for primary pancreatic neoplasm or cholangiocarcinoma. Further evaluation with MRI recommended. 2.  Cholelithiasis.  No additional evidence for cholecystitis. 3.   Stable left renal calculus and right renal pelvis calculus. 4. Subsolid nodule in the right lower lobe is unchanged. Metastatic disease or primary neoplasm not excluded. 5.  Aortic Atherosclerosis (ICD10-I70.0). Electronically Signed   By: Ronney Asters M.D.   On: 11/12/2021 18:50   MR 3D Recon At Scanner  Result Date: 11/13/2021 CLINICAL DATA:  Obstructive jaundice. History of breast cancer, lung  cancer, and colon cancer with mets to. EXAM: MRI ABDOMEN WITHOUT AND WITH CONTRAST (INCLUDING MRCP) TECHNIQUE: Multiplanar multisequence MR imaging of the abdomen was performed both before and after the administration of intravenous contrast. Heavily T2-weighted images of the biliary and pancreatic ducts were obtained, and three-dimensional MRCP images were rendered by post processing. CONTRAST:  14mL GADAVIST GADOBUTROL 1 MMOL/ML IV SOLN COMPARISON:  Abdomen/pelvis CT 11/12/2021. Abdomen pelvis CT 08/04/2021. FINDINGS: Lower chest: Unremarkable. Hepatobiliary: 6.2 x 5.2 x 5.7 cm heterogeneously enhancing lesion is identified in the central porta hepatis invading the medial liver and caudate lobe. Lesion restricts diffusion and is well visualized on diffusion-weighted imaging (see image 50 of series 11). This mass has been progressive over the last 2 CT scans although  it was extremely subtle on those studies performed without intravenous contrast material, mimicking background liver attenuation. On today's exam, this lesion obstructs the left and right hepatic ducts at the confluence. Lesion generates substantial mass-effect on and may actually invade the main portal vein although the portal vein does appear to remain patent. No evidence for filling defect in the hepatic venous anatomy. Intrahepatic biliary duct dilatation is seen in the left and right hepatic lobes with obliteration of the common duct in the hepatoduodenal ligament. Common bile duct reconstitutes distal to the mass and is normal diameter leading into the ampulla. Gallbladder is contracted around a 2.5 cm gallstone. Pancreas: Above described mass in the porta hepatis does not appear to arise from the pancreas by MR imaging. No dilatation of the main duct. No intraparenchymal cyst. No peripancreatic edema. Spleen:  No splenomegaly. No focal mass lesion. Adrenals/Urinary Tract: No adrenal nodule or mass. Multiple bilateral renal cysts including  dominant exophytic 9.2 cm cyst upper pole left kidney. Stomach/Bowel: Stomach is unremarkable. No gastric wall thickening. No evidence of outlet obstruction. No small bowel or colonic dilatation within the visualized abdomen. Vascular/Lymphatic: No abdominal aortic aneurysm. Although difficult to assess given motion artifact, the mass lesion in the porta hepatis abuts the common hepatic artery and appears to encase the left hepatic artery. Right hepatic artery may be replaced arising from the SMA, although this is not well visualized. See above for discussion of portal venous anatomy. Superior mesenteric vein and splenic vein are patent.No retroperitoneal lymphadenopathy. Other:  No substantial intraperitoneal free fluid. Musculoskeletal: No focal suspicious marrow enhancement within the visualized bony anatomy. IMPRESSION: 1. 6.2 x 5.2 x 5.7 cm heterogeneously enhancing lesion in the porta hepatis invading the medial liver and caudate lobe. This mass obstructs the left and right hepatic ducts near the confluence with intrahepatic biliary duct dilatation. Lesion generates substantial mass-effect on and may actually invade the main portal vein although the portal vein does appear to remain patent (assessment markedly limited due to motion artifact). MR imaging suggests that this is not a primary pancreatic neoplasm. Metastatic disease would be a distinct consideration. Peripheral cholangiocarcinoma and hepatocellular carcinoma are not excluded but felt to be less likely based on enhancement characteristics. 2. Cholelithiasis. 3. Bilateral renal cysts. Electronically Signed   By: Misty [name] M.D.   On: 11/13/2021 05:42   MR ABDOMEN MRCP W WO CONTAST  Result Date: 11/13/2021 CLINICAL DATA:  Obstructive jaundice. History of breast cancer, lung cancer, and colon cancer with mets to. EXAM: MRI ABDOMEN WITHOUT AND WITH CONTRAST (INCLUDING MRCP) TECHNIQUE: Multiplanar multisequence MR imaging of the abdomen was  performed both before and after the administration of intravenous contrast. Heavily T2-weighted images of the biliary and pancreatic ducts were obtained, and three-dimensional MRCP images were rendered by post processing. CONTRAST:  61mL GADAVIST GADOBUTROL 1 MMOL/ML IV SOLN COMPARISON:  Abdomen/pelvis CT 11/12/2021. Abdomen pelvis CT 08/04/2021. FINDINGS: Lower chest: Unremarkable. Hepatobiliary: 6.2 x 5.2 x 5.7 cm heterogeneously enhancing lesion is identified in the central porta hepatis invading the medial liver and caudate lobe. Lesion restricts diffusion and is well visualized on diffusion-weighted imaging (see image 50 of series 11). This mass has been progressive over the last 2 CT scans although it was extremely subtle on those studies performed without intravenous contrast material, mimicking background liver attenuation. On today's exam, this lesion obstructs the left and right hepatic ducts at the confluence. Lesion generates substantial mass-effect on and may actually invade the main portal vein although the  portal vein does appear to remain patent. No evidence for filling defect in the hepatic venous anatomy. Intrahepatic biliary duct dilatation is seen in the left and right hepatic lobes with obliteration of the common duct in the hepatoduodenal ligament. Common bile duct reconstitutes distal to the mass and is normal diameter leading into the ampulla. Gallbladder is contracted around a 2.5 cm gallstone. Pancreas: Above described mass in the porta hepatis does not appear to arise from the pancreas by MR imaging. No dilatation of the main duct. No intraparenchymal cyst. No peripancreatic edema. Spleen:  No splenomegaly. No focal mass lesion. Adrenals/Urinary Tract: No adrenal nodule or mass. Multiple bilateral renal cysts including dominant exophytic 9.2 cm cyst upper pole left kidney. Stomach/Bowel: Stomach is unremarkable. No gastric wall thickening. No evidence of outlet obstruction. No small bowel  or colonic dilatation within the visualized abdomen. Vascular/Lymphatic: No abdominal aortic aneurysm. Although difficult to assess given motion artifact, the mass lesion in the porta hepatis abuts the common hepatic artery and appears to encase the left hepatic artery. Right hepatic artery may be replaced arising from the SMA, although this is not well visualized. See above for discussion of portal venous anatomy. Superior mesenteric vein and splenic vein are patent.No retroperitoneal lymphadenopathy. Other:  No substantial intraperitoneal free fluid. Musculoskeletal: No focal suspicious marrow enhancement within the visualized bony anatomy. IMPRESSION: 1. 6.2 x 5.2 x 5.7 cm heterogeneously enhancing lesion in the porta hepatis invading the medial liver and caudate lobe. This mass obstructs the left and right hepatic ducts near the confluence with intrahepatic biliary duct dilatation. Lesion generates substantial mass-effect on and may actually invade the main portal vein although the portal vein does appear to remain patent (assessment markedly limited due to motion artifact). MR imaging suggests that this is not a primary pancreatic neoplasm. Metastatic disease would be a distinct consideration. Peripheral cholangiocarcinoma and hepatocellular carcinoma are not excluded but felt to be less likely based on enhancement characteristics. 2. Cholelithiasis. 3. Bilateral renal cysts. Electronically Signed   By: Misty [name] M.D.   On: 11/13/2021 05:42   US Abdomen Limited RUQ (LIVER/GB)  Result Date: 11/12/2021 CLINICAL DATA:  History of jaundice, known metastatic colon carcinoma EXAM: ULTRASOUND ABDOMEN LIMITED RIGHT UPPER QUADRANT COMPARISON:  CT from 08/04/2021 FINDINGS: Gallbladder: Cholelithiasis is identified without wall thickening or pericholecystic fluid. The overall appearance is similar seen on prior CT examination. Common bile duct: Diameter: 11.3 mm. Liver: Increased in echogenicity without focal  mass. Portal vein is patent on color Doppler imaging with normal direction of blood flow towards the liver. Biliary ductal dilatation is noted. Other: None. IMPRESSION: Cholelithiasis without complicating factors. Prominent common bile duct and intrahepatic dilatation without definitive stone. This is suggestive of distal common bile duct stone and will be better evaluated on upcoming CT examination. Fatty liver Electronically Signed   By: Inez Catalina M.D.   On: 11/12/2021 18:26        Scheduled Meds:  [KXF Hold] sodium chloride   Intravenous Once   [MAR Hold] Chlorhexidine Gluconate Cloth  6 each Topical Daily   [MAR Hold] Chlorhexidine Gluconate Cloth  6 each Topical Daily   [MAR Hold] feeding supplement  1 Container Oral TID BM   [MAR Hold] letrozole  2.5 mg Oral Daily   [MAR Hold] metoprolol tartrate  12.5 mg Oral BID   [MAR Hold] multivitamin with minerals  1 tablet Oral Daily   [MAR Hold] pantoprazole  40 mg Oral Daily   [MAR Hold] simvastatin  10 mg Oral q1800   [MAR Hold] sodium chloride flush  3 mL Intravenous Q12H   Continuous Infusions:  sodium chloride 75 mL/hr at 11/13/21 2332   potassium chloride       LOS: 1 day    Time spent:25 mins. More than 50% of that time was spent in counseling and/or coordination of care.      Shelly Coss, MD [facility] P11/19/2022, 8:00 AM

## 2021-11-14 NOTE — Anesthesia Procedure Notes (Signed)
Procedure Name: Intubation Date/Time: 11/14/2021 7:55 AM Performed by: Victoriano Lain, CRNA Pre-anesthesia Checklist: Patient identified, Emergency Drugs available, Suction available, Patient being monitored and Timeout performed Patient Re-evaluated:Patient Re-evaluated prior to induction Oxygen Delivery Method: Circle system utilized Preoxygenation: Pre-oxygenation with 100% oxygen Induction Type: IV induction Ventilation: Mask ventilation without difficulty Laryngoscope Size: Mac and 4 Grade View: Grade I Tube type: Oral Tube size: 7.5 mm Number of attempts: 1 Airway Equipment and Method: Stylet Placement Confirmation: ETT inserted through vocal cords under direct vision, positive ETCO2 and breath sounds checked- equal and bilateral Secured at: 21 cm Tube secured with: Tape Dental Injury: Teeth and Oropharynx as per pre-operative assessment

## 2021-11-14 NOTE — Progress Notes (Signed)
Unable to obtain type and screen pink Vacutainers. Unit without, called materials will deliver as they can, called two units and currently without. Will draw T&S as soon as vacutainers are available.

## 2021-11-14 NOTE — Progress Notes (Signed)
Vacutainers obtained from materials. Type and screen drawn per MD order and per protocol, hand delivered to lab.Neomia Dear, RN

## 2021-11-14 NOTE — Progress Notes (Signed)
Notified Tele, pt removed from cardiac monitoring due to scheduled ERCP and currently transferring.

## 2021-11-14 NOTE — Anesthesia Preprocedure Evaluation (Signed)
Anesthesia Evaluation  Patient identified by MRN, date of birth, ID band Patient awake    Reviewed: Allergy & Precautions, NPO status , Patient's Chart, lab work & pertinent test results  Airway Mallampati: II  TM Distance: >3 FB Neck ROM: Full    Dental  (+) Partial Upper   Pulmonary shortness of breath and Long-Term Oxygen Therapy, asthma , COPD (2L overnight),  COPD inhaler and oxygen dependent, former smoker,    Pulmonary exam normal        Cardiovascular hypertension, Pt. on medications and Pt. on home beta blockers +CHF and + DOE  + dysrhythmias (on Coumadin) Atrial Fibrillation  Rhythm:Irregular Rate:Normal     Neuro/Psych CVA (cognitive issues), Residual Symptoms negative psych ROS   GI/Hepatic Neg liver ROS, GERD  ,  Endo/Other  negative endocrine ROS  Renal/GU Renal InsufficiencyRenal disease  negative genitourinary   Musculoskeletal Left breast cancer    Abdominal (+) + obese,  Abdomen: soft.    Peds  Hematology  (+) anemia ,   Anesthesia Other Findings   Reproductive/Obstetrics                             Anesthesia Physical  Anesthesia Plan  ASA: 4  Anesthesia Plan: General   Post-op Pain Management:    Induction: Intravenous, Rapid sequence and Cricoid pressure planned  PONV Risk Score and Plan: 3 and Ondansetron, Dexamethasone, Treatment may vary due to age or medical condition and Midazolam  Airway Management Planned: Oral ETT  Additional Equipment: None  Intra-op Plan:   Post-operative Plan: Extubation in OR  Informed Consent: I have reviewed the patients History and Physical, chart, labs and discussed the procedure including the risks, benefits and alternatives for the proposed anesthesia with the patient or authorized representative who has indicated his/her understanding and acceptance.     Dental advisory given  Plan Discussed with:  CRNA  Anesthesia Plan Comments:         Anesthesia Quick Evaluation

## 2021-11-14 NOTE — Transfer of Care (Signed)
Immediate Anesthesia Transfer of Care Note  Patient: Tammie [name]  Procedure(s) Performed: ENDOSCOPIC RETROGRADE CHOLANGIOPANCREATOGRAPHY (ERCP) SPHINCTEROTOMY BILIARY STENT PLACEMENT  Patient Location: PACU and Endoscopy Unit  Anesthesia Type:General  Level of Consciousness: awake, alert , oriented and patient cooperative  Airway & Oxygen Therapy: Patient Spontanous Breathing and Patient connected to face mask oxygen  Post-op Assessment: Report given to RN, Post -op Vital signs reviewed and stable and Patient moving all extremities  Post vital signs: Reviewed and stable  Last Vitals:  Vitals Value Taken Time  BP    Temp    Pulse 79 11/14/21 0914  Resp 15 11/14/21 0914  SpO2 100 % 11/14/21 0914  Vitals shown include unvalidated device data.  Last Pain:  Vitals:   11/14/21 0722  TempSrc: Oral  PainSc: 4          Complications: No notable events documented.

## 2021-11-14 NOTE — Anesthesia Postprocedure Evaluation (Signed)
Anesthesia Post Note  Patient: Tammie [name]  Procedure(s) Performed: ENDOSCOPIC RETROGRADE CHOLANGIOPANCREATOGRAPHY (ERCP) SPHINCTEROTOMY BILIARY STENT PLACEMENT BIOPSY     Patient location during evaluation: Endoscopy Anesthesia Type: General Level of consciousness: awake and alert Pain management: pain level controlled Vital Signs Assessment: post-procedure vital signs reviewed and stable Respiratory status: spontaneous breathing, nonlabored ventilation and respiratory function stable Cardiovascular status: blood pressure returned to baseline and stable Postop Assessment: no apparent nausea or vomiting Anesthetic complications: no   No notable events documented.  Last Vitals:  Vitals:   11/14/21 0941 11/14/21 1007  BP: (!) 175/80 (!) 175/71  Pulse: 85 78  Resp: 16 (!) 21  Temp:  36.6 C  SpO2: 94% 96%    Last Pain:  Vitals:   11/14/21 1007  TempSrc: Oral  PainSc:                  Lynda Rainwater

## 2021-11-14 NOTE — Progress Notes (Signed)
Pt consented for blood products per MD order. Neomia Dear, RN

## 2021-11-15 DIAGNOSIS — Z7189 Other specified counseling: Secondary | ICD-10-CM

## 2021-11-15 DIAGNOSIS — K831 Obstruction of bile duct: Secondary | ICD-10-CM | POA: Diagnosis not present

## 2021-11-15 DIAGNOSIS — Z515 Encounter for palliative care: Secondary | ICD-10-CM | POA: Diagnosis not present

## 2021-11-15 DIAGNOSIS — C801 Malignant (primary) neoplasm, unspecified: Secondary | ICD-10-CM | POA: Diagnosis not present

## 2021-11-15 DIAGNOSIS — C229 Malignant neoplasm of liver, not specified as primary or secondary: Secondary | ICD-10-CM | POA: Diagnosis not present

## 2021-11-15 LAB — CBC WITH DIFFERENTIAL/PLATELET
Abs Immature Granulocytes: 0.07 10*3/uL (ref 0.00–0.07)
Basophils Absolute: 0 10*3/uL (ref 0.0–0.1)
Basophils Relative: 0 %
Eosinophils Absolute: 0 10*3/uL (ref 0.0–0.5)
Eosinophils Relative: 0 %
HCT: 41.1 % (ref 36.0–46.0)
Hemoglobin: 12.8 g/dL (ref 12.0–15.0)
Immature Granulocytes: 1 %
Lymphocytes Relative: 5 %
Lymphs Abs: 0.4 10*3/uL — ABNORMAL LOW (ref 0.7–4.0)
MCH: 27.1 pg (ref 26.0–34.0)
MCHC: 31.1 g/dL (ref 30.0–36.0)
MCV: 87.1 fL (ref 80.0–100.0)
Monocytes Absolute: 0.7 10*3/uL (ref 0.1–1.0)
Monocytes Relative: 8 %
Neutro Abs: 7.1 10*3/uL (ref 1.7–7.7)
Neutrophils Relative %: 86 %
Platelets: 193 10*3/uL (ref 150–400)
RBC: 4.72 MIL/uL (ref 3.87–5.11)
RDW: 17.4 % — ABNORMAL HIGH (ref 11.5–15.5)
WBC: 8.3 10*3/uL (ref 4.0–10.5)
nRBC: 0 % (ref 0.0–0.2)

## 2021-11-15 LAB — BPAM FFP
Blood Product Expiration Date: 202211242359
Blood Product Expiration Date: 202211242359
ISSUE DATE / TIME: 202211190509
ISSUE DATE / TIME: 202211190735
Unit Type and Rh: 5100
Unit Type and Rh: 5100

## 2021-11-15 LAB — COMPREHENSIVE METABOLIC PANEL
ALT: 77 U/L — ABNORMAL HIGH (ref 0–44)
AST: 62 U/L — ABNORMAL HIGH (ref 15–41)
Albumin: 3.1 g/dL — ABNORMAL LOW (ref 3.5–5.0)
Alkaline Phosphatase: 254 U/L — ABNORMAL HIGH (ref 38–126)
Anion gap: 8 (ref 5–15)
BUN: 38 mg/dL — ABNORMAL HIGH (ref 8–23)
CO2: 22 mmol/L (ref 22–32)
Calcium: 8.5 mg/dL — ABNORMAL LOW (ref 8.9–10.3)
Chloride: 107 mmol/L (ref 98–111)
Creatinine, Ser: 2.11 mg/dL — ABNORMAL HIGH (ref 0.44–1.00)
GFR, Estimated: 23 mL/min — ABNORMAL LOW (ref >60)
Glucose, Bld: 165 mg/dL — ABNORMAL HIGH (ref 70–99)
Potassium: 4.4 mmol/L (ref 3.5–5.1)
Sodium: 137 mmol/L (ref 135–145)
Total Bilirubin: 7.1 mg/dL — ABNORMAL HIGH (ref 0.3–1.2)
Total Protein: 6.8 g/dL (ref 6.5–8.1)

## 2021-11-15 LAB — PREPARE FRESH FROZEN PLASMA

## 2021-11-15 LAB — PROTIME-INR
INR: 1.5 — ABNORMAL HIGH (ref 0.8–1.2)
Prothrombin Time: 17.9 seconds — ABNORMAL HIGH (ref 11.4–15.2)

## 2021-11-15 MED ORDER — LIP MEDEX EX OINT
TOPICAL_OINTMENT | CUTANEOUS | Status: DC | PRN
  Filled 2021-11-15: qty 7

## 2021-11-15 MED ORDER — WARFARIN - PHARMACIST DOSING INPATIENT: Freq: Every day | Status: DC

## 2021-11-15 MED ORDER — WARFARIN SODIUM 2.5 MG PO TABS
2.5000 mg | ORAL_TABLET | Freq: Once | ORAL | Status: AC
  Administered 2021-11-15: 2.5 mg via ORAL
  Filled 2021-11-15: qty 1

## 2021-11-15 MED ORDER — SENNOSIDES-DOCUSATE SODIUM 8.6-50 MG PO TABS
2.0000 | ORAL_TABLET | Freq: Every day | ORAL | Status: DC
  Administered 2021-11-15: 2 via ORAL
  Filled 2021-11-15: qty 2

## 2021-11-15 MED ORDER — AMLODIPINE BESYLATE 10 MG PO TABS
10.0000 mg | ORAL_TABLET | Freq: Every day | ORAL | Status: DC
  Administered 2021-11-15 – 2021-11-16 (×2): 10 mg via ORAL
  Filled 2021-11-15 (×2): qty 1

## 2021-11-15 NOTE — Progress Notes (Signed)
[facility] Progress Note    Since last GI note: ERCP yesterday, see full report in epic.  She has done well since then, no abd pains, no n/v. Tolerating solid food  Objective: Vital signs in last 24 hours: Temp:  [97.9 F (36.6 C)-98.6 F (37 C)] 98.3 F (36.8 C) (11/20 0455) Pulse Rate:  [77-87] 78 (11/20 0455) Resp:  [14-26] 19 (11/20 0455) BP: (141-195)/(65-113) 155/79 (11/20 0455) SpO2:  [93 %-100 %] 95 % (11/20 0455) Last BM Date: 11/13/21 General: alert and oriented times 3 Heart: regular rate and rythm Abdomen: soft, non-tender, non-distended, normal bowel sounds  Lab Results: Recent Labs    11/13/21 0518 11/14/21 0408 11/15/21 0453  WBC 5.6 5.3 8.3  HGB 13.1 13.0 12.8  PLT 174 185 193  MCV 84.3 84.9 87.1   Recent Labs    11/13/21 0518 11/14/21 0408 11/15/21 0453  NA 140 141 137  K 3.0* 3.3* 4.4  CL 106 109 107  CO2 _0 GLUCOSE 124* 112* 165*  BUN 48* 37* 38*  CREATININE 1.92* 1.54* 2.11*  CALCIUM 8.6* 8.5* 8.5*   Recent Labs    11/12/21 1431 11/12/21 1813 11/13/21 0518 11/14/21 0408 11/15/21 0453  PROT 6.9   < > 6.6 6.4* 6.8  ALBUMIN 4.0   < > 3.1* 2.9* 3.1*  AST 113*   < > 82* 95* 62*  ALT 115*   < > 86* 90* 77*  ALKPHOS 370*   < > 263* 264* 254*  BILITOT 11.1*   < > 10.5* 12.0* 7.1*  BILIDIR 9.51*  --   --  7.7*  --   IBILI  --   --   --  4.3*  --    < > = values in this interval not displayed.   Recent Labs    11/14/21 0408 11/15/21 0453  INR 2.6* 1.5*    Medications: Scheduled Meds:  sodium chloride   Intravenous Once   Chlorhexidine Gluconate Cloth  6 each Topical Daily   Chlorhexidine Gluconate Cloth  6 each Topical Daily   feeding supplement  1 Container Oral TID BM   letrozole  2.5 mg Oral Daily   metoprolol tartrate  12.5 mg Oral BID   multivitamin with minerals  1 tablet Oral Daily   pantoprazole  40 mg Oral Daily   sodium chloride flush  3 mL Intravenous Q12H   Continuous Infusions:  sodium  chloride 75 mL/hr at 11/14/21 1815   PRN Meds:.acetaminophen **OR** acetaminophen, albuterol, lip balm   Assessment/Plan: 80 y.o. female with numerous cancers, new biliary obstruction from a large, centrally located liver mass  Large, centrally located liver mass.  I reviewed her recent and old films with radiology, dating back to 2017. The mass in her liver that is now causing biliary obstruction was probably present even back in 2017 and was likely a periportal lymphnode at that time.  Seems this likely represents colon cancer met.   The stents that I placed seem to be functioning, her LFTS are much improved, not normal yet certainly.  It may take several days before they nadir.  I sampled one of the biliary strictures with cytology brushing, those results will probably be back Monday or Tuesday. She does not need to remain in the hospital just for those results.  I am signing off for now.  Her Cr is rising and so not sure she is ready for d/c yet.  She needs to follow up with oncology (has  appt in early December already).  I am not planning any further biliary procedures unless there is concerns that she is obstructed again.  Please call or page with any further questions or concerns.  Milus Banister, MD  11/15/2021, 6:56 AM [facility] Pager [phone]

## 2021-11-15 NOTE — Progress Notes (Signed)
ANTICOAGULATION CONSULT NOTE  Pharmacy Consult for Warfarin Indication: atrial fibrillation  Allergies  Allergen Reactions   Tape Hives   Contrast Media [Iodinated Diagnostic Agents] Hives   Iohexol Other (See Comments)     Code: HIVES, Desc: hives w/ contrast '08// better w/ benadryl    Sulfa Antibiotics Other (See Comments)    REACTION: unknown   Sulfamethoxazole-Trimethoprim Other (See Comments)    REACTION: unknown    Patient Measurements: Height: 5' 10" (177.8 cm) Weight: 117.3 kg (258 lb 9.6 oz) IBW/kg (Calculated) : 68.5  Vital Signs: Temp: 98.3 F (36.8 C) (11/20 0455) Temp Source: Oral (11/20 0455) BP: 155/79 (11/20 0455) Pulse Rate: 78 (11/20 0455)  Labs: Recent Labs    11/13/21 0518 11/14/21 0408 11/15/21 0453  HGB 13.1 13.0 12.8  HCT 40.8 40.9 41.1  PLT 174 185 193  LABPROT 32.5* 28.1* 17.9*  INR 3.2* 2.6* 1.5*  CREATININE 1.92* 1.54* 2.11*    Estimated Creatinine Clearance: 29.5 mL/min (A) (by C-G formula based on SCr of 2.11 mg/dL (H)).  Medications:  Scheduled:   sodium chloride   Intravenous Once   Chlorhexidine Gluconate Cloth  6 each Topical Daily   Chlorhexidine Gluconate Cloth  6 each Topical Daily   feeding supplement  1 Container Oral TID BM   letrozole  2.5 mg Oral Daily   metoprolol tartrate  12.5 mg Oral BID   multivitamin with minerals  1 tablet Oral Daily   pantoprazole  40 mg Oral Daily   sodium chloride flush  3 mL Intravenous Q12H   Assessment: 80 yoF presented to ED on 11/17 for evaluation of jaundice with known breast cancer, lung cancer, colon cancer, with mets to the liver.  PMH also includes warfarin for Afib.  Pharmacy is consulted to resume inpatient dosing following ERCP on 11/19.  INR was reportedly elevated at 5 one week ago then elevated at 4.7 three days ago.   PTA warfarin 2.5 mg daily except 3.75 mg PO daily on Tuesday and Wednesday. Vitamin K 5mg PO given on 11/16 at a clinic visit 11/19 2 units FFP  administered  Today, 11/15/2021 INR 1.5, sub-therapeutic and no warfarin since 11/14 CBC: Hgb and Plt WNL.   Tbili elevated at 7.1 AST 62, ALT 77, alk phos 254 - improved  Goal of Therapy:  INR 2-3 Monitor platelets by anticoagulation protocol: Yes   Plan:  Warfarin 2.5 mg po x 1 today Daily PT/INR. Monitor for signs and symptoms of bleeding.   Melissa M. James, PharmD, BCPS Clinical Pharmacist [place] Please utilize Amion for appropriate phone number to reach the unit pharmacist ([facility]) 11/15/2021 10:59 AM   

## 2021-11-15 NOTE — Progress Notes (Signed)
PROGRESS NOTE    Tammie [name]  WUJ:[id number] DOB: 1941/08/29 DOA: 11/12/2021 PCP: [name], FNP   Chief Complain: jaundice  Brief Narrative:  Patient is a 80 year old female with history of breast cancer, lung cancer, colon cancer with mets to liver, congestive heart failure, asthma, atrial fibrillation, CKD stage IIIa, CVA, hypertension, hyperlipidemia who presents with new onset jaundice.  She also reported some pruritus, pale-colored stool, darker urine..  On her PCPs office, she was noticed to be jaundiced, lab work showed elevated bilirubin and she was sent to the emergency department.  On presentation she was hemodynamically stable.  Lab work showed hypokalemia, AKI, elevated liver enzymes, elevated bilirubin, INR of 3.5.  Ultrasound of the abdomen showed gallstones and a prominent common bile duct with intrahepatic dilation but no clearly stone.  CT abdomen/pelvis showed fullness in the soft tissue superior to the pancreatic head concerning for pancreatic cancer versus cholangiocarcinoma.  MRI of the abdomen showed 6.2 x 5.2 x 5.7 cm heterogeneously enhancing lesion in the porta hepatis invading the medial liver and caudate lobe, obstructing the left and right hepatic ducts near the confluence with intrahepatic biliary duct dilatation suggesting metastatic disease.  Pancreatic cancer/cholangiocarcinoma less likely.  Gastroenterology has been consulted.  Underwent ERCP on 11/19 with placement of 2 biliary stents.  Planning for discharge tomorrow to home after improvement in the kidney function.  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  Presented with pruritus, pale stools, dark urine, jaundice.  Found to have elevated liver enzymes, bilirubin.  Lipase level normal.  Abdominal imagings as above.  Metastatic disease causing biliary obstruction.  Gastroenterology consulted, she underwent ERCP  with finding of large central hepatic mass causing complicated biliary strictures.  Status post placement of 2 stents.  Biliary cytology was obtained.  GI signed off.  We recommend to follow-up with her oncology as soon as possible.  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  Creatinine was elevated on presentation.  Improved with IV fluids, again creeped up.  IV fluids discontinued.  She had significant input yesterday.  Will check BNP.  If elevated, will try Lasix IV.  History of diastolic congestive heart failure: Last echo on 08/2020 showed EF of 60 to 65%, indeterminate diastolic parameters.  Takes Lasix at home.    Hypertension: Continue metoprolol, amlodipine.  Monitor blood pressure  Paroxysmal A. fib: Currently rate is controlled, currently in normal sinus rhythm.  On metoprolol.  On warfarin at home for anticoagulation.  Presents with supratherapeutic INR of 3.5.  Monitor INR.  INR down in the range of 1.5.  Can resume warfarin now.  Asthma: Currently stable.  Continue albuterol inhaler as needed  History of CVA/hyperlipidemia: On warfarin, simvastatin  History of adenocarcinoma of colon with mets to liver/breast cancer: Most likely this  is the cause of the metastatic disease.  Status post Keytruda treatment.  Also on letrozole.  Goals of care: Imaging showed progression of metastatic disease causing  jaundice.  Very poor prognosis.  We discussed goals of care at bedside.  She wants to remain as full code for now.  She has limited family members.  She knows that she has metastatic disease and does not have good outcome. We have  consulted palliative care for further goals of care.  Nutrition Problem: Increased nutrient needs Etiology: cancer and cancer related treatments      DVT prophylaxis: SCD  code Status: Full Family Communication: None at bedside.  Patient says no need to call anybody, she has been updating herself. Status is: Inpatient    Consultants: GI  Procedures:None  Antimicrobials:  Anti-infectives (From admission, onward)    None       Subjective:  Patient seen and examined at the bedside this morning.  Hemodynamically stable.  Looks overall comfortable.  Denies nausea, vomiting or abdominal pain.  Complains of some sweating today.  Objective: Vitals:   11/14/21 1007 11/14/21 1444 11/14/21 2051 11/15/21 0455  BP: (!) 175/71 (!) 154/65 (!) 141/113 (!) 155/79  Pulse: 78 79 79 78  Resp: (!) 21 (!) 24 19 19   Temp: 97.9 F (36.6 C) 97.9 F (36.6 C) 98.5 F (36.9 C) 98.3 F (36.8 C)  TempSrc: Oral Oral Oral Oral  SpO2: 96% 93% 93% 95%  Weight:      Height:        Intake/Output Summary (Last 24 hours) at 11/15/2021 2505 Last data filed at 11/15/2021 3976 Gross per 24 hour  Intake 2801.97 ml  Output 1150 ml  Net 1651.97 ml   Filed Weights   11/12/21 1640 11/12/21 2341  Weight: 117.5 kg 117.3 kg    Examination:    General exam: Overall comfortable, not in distress, obese HEENT: PERRL Respiratory system:  no wheezes or crackles  Cardiovascular system: S1 & S2 heard, RRR.  Gastrointestinal system: Abdomen is nondistended, soft and nontender. Central nervous system: Alert and oriented Extremities: No edema, no clubbing ,no cyanosis Skin: Icterus  Data Reviewed: I have personally reviewed following labs and imaging studies  CBC: Recent  Labs  Lab 11/12/21 1813 11/13/21 0518 11/14/21 0408 11/15/21 0453  WBC 6.6 5.6 5.3 8.3  NEUTROABS 4.7  --  3.4 7.1  HGB 14.2 13.1 13.0 12.8  HCT 44.0 40.8 40.9 41.1  MCV 84.9 84.3 84.9 87.1  PLT 209 174 185 734   Basic Metabolic Panel: Recent Labs  Lab 11/12/21 1431 11/12/21 1812 11/12/21 1813 11/13/21 0518 11/14/21 0408 11/15/21 0453  NA 142  --  140 140 141 137  K 3.5  --  3.0* 3.0* 3.3* 4.4  CL 103  --  105 106 109 107  CO2 22  --  22 23 22 22   GLUCOSE 110*  --  114* 124* 112* 165*  BUN 41*  --  49* 48* 37* 38*  CREATININE 2.38*  --  2.12* 1.92* 1.54* 2.11*  CALCIUM 9.2  --  9.1 8.6* 8.5* 8.5*  MG  --  2.0  --   --   --   --    GFR: Estimated Creatinine Clearance: 29.5 mL/min (A) (by C-G formula based on SCr of 2.11 mg/dL (H)). Liver Function Tests: Recent Labs  Lab 11/12/21 1431 11/12/21 1813 11/13/21 0518 11/14/21 0408 11/15/21 0453  AST 113* 100* 82* 95* 62*  ALT 115* 105* 86* 90* 77*  ALKPHOS 370* 275* 263* 264* 254*  BILITOT 11.1* 11.9* 10.5* 12.0* 7.1*  PROT 6.9 7.5 6.6 6.4* 6.8  ALBUMIN 4.0 3.3* 3.1* 2.9* 3.1*   Recent Labs  Lab 11/12/21 1813  LIPASE 51   No results for input(s): AMMONIA in the last 168 hours. Coagulation Profile: Recent Labs  Lab 11/12/21 1420 11/12/21 1813 11/13/21 0518 11/14/21 0408 11/15/21 0453  INR 4.7* 3.5* 3.2* 2.6* 1.5*   Cardiac Enzymes: No results for input(s): CKTOTAL, CKMB, CKMBINDEX, TROPONINI in the last 168 hours. BNP (last 3 results) No results for input(s): PROBNP in the last 8760 hours. HbA1C: No results for input(s): HGBA1C in the last 72 hours. CBG: No results for input(s): GLUCAP in the last 168 hours. Lipid Profile: No results for input(s): CHOL, HDL, LDLCALC, TRIG, CHOLHDL, LDLDIRECT in the last 72 hours. Thyroid Function Tests: No results for input(s): TSH, T4TOTAL, FREET4, T3FREE, THYROIDAB in the last 72 hours. Anemia Panel: No results for input(s): VITAMINB12, FOLATE, FERRITIN, TIBC,  IRON, RETICCTPCT in the last 72 hours. Sepsis Labs: No results for input(s): PROCALCITON, LATICACIDVEN in the last 168 hours.  Recent Results (from the past 240 hour(s))  Microscopic Examination     Status: Abnormal   Collection Time: 11/12/21  2:32 PM   Urine  Result Value Ref Range Status   WBC, UA 6-10 (A) 0 - 5 /hpf Final   RBC None seen 0 - 2 /hpf Final   Epithelial Cells (non renal) 0-10 0 - 10 /hpf Final   Renal Epithel, UA None seen None seen /hpf Final   Bacteria, UA Many (A) None seen/Few Final  Resp Panel by RT-PCR (Flu A&B, Covid) Nasopharyngeal Swab     Status: None   Collection Time: 11/12/21  6:13 PM   Specimen: Nasopharyngeal Swab; Nasopharyngeal(NP) swabs in vial transport medium  Result Value Ref Range Status   SARS Coronavirus 2 by RT PCR NEGATIVE NEGATIVE Final    Comment: (NOTE) SARS-CoV-2 target nucleic acids are NOT DETECTED.  The SARS-CoV-2 RNA is generally detectable in upper respiratory specimens during the acute phase of infection. The lowest concentration of SARS-CoV-2 viral copies this assay can detect is 138 copies/mL. A negative result does not preclude SARS-Cov-2 infection and should not be used as the sole basis for treatment or other patient management decisions. A negative result may occur with  improper specimen collection/handling, submission of specimen other than nasopharyngeal swab, presence of viral mutation(s) within the areas targeted by this assay, and inadequate number of viral copies(<138 copies/mL). A negative result must be combined with clinical observations, patient history, and epidemiological information. The expected result is Negative.  Fact Sheet for Patients:  EntrepreneurPulse.com.au  Fact Sheet for Healthcare Providers:  IncredibleEmployment.be  This test is no t yet approved or cleared by the [place] FDA and  has been authorized for detection and/or diagnosis of SARS-CoV-2  by FDA under an Emergency Use Authorization (EUA). This EUA will remain  in effect (meaning this test can be used) for the duration of the COVID-19 declaration under Section 564(b)(1) of the Act, 21 U.S.C.section 360bbb-3(b)(1), unless the authorization is terminated  or revoked sooner.       Influenza A by PCR NEGATIVE NEGATIVE Final   Influenza B by PCR NEGATIVE NEGATIVE Final    Comment: (NOTE) The Xpert Xpress SARS-CoV-2/FLU/RSV plus assay is intended as an aid in the diagnosis of influenza from Nasopharyngeal swab specimens and should not be used as a sole basis for treatment. Nasal washings and  aspirates are unacceptable for Xpert Xpress SARS-CoV-2/FLU/RSV testing.  Fact Sheet for Patients: EntrepreneurPulse.com.au  Fact Sheet for Healthcare Providers: IncredibleEmployment.be  This test is not yet approved or cleared by the [place] FDA and has been authorized for detection and/or diagnosis of SARS-CoV-2 by FDA under an Emergency Use Authorization (EUA). This EUA will remain in effect (meaning this test can be used) for the duration of the COVID-19 declaration under Section 564(b)(1) of the Act, 21 U.S.C. section 360bbb-3(b)(1), unless the authorization is terminated or revoked.  Performed at [facility], [place] [address], [place], [place] [address]          Radiology Studies: DG ERCP  Result Date: 11/14/2021 CLINICAL DATA:  ERCP EXAM: ERCP TECHNIQUE: Multiple spot images obtained with the fluoroscopic device and submitted for interpretation post-procedure. FLUOROSCOPY TIME:  Refer to procedure report. COMPARISON:  None. FINDINGS: A total of 4 fluoroscopic spot films are submitted for review taken during ERCP procedure. Images demonstrate scope overlying the mid abdomen. The common bile duct is catheterized, and contrast injection of the intrahepatic biliary tree demonstrate dilated biliary ducts. There is a  metallic stent which appears to be deployed in the left hepatic duct. A plastic biliary stent appears to be placed into the common bile duct. IMPRESSION: Fluoroscopic images taken during ERCP as described. Refer to procedure report for full details. These images were submitted for radiologic interpretation only. Please see the procedural report for the amount of contrast and the fluoroscopy time utilized. Electronically Signed   By: Albin Felling M.D.   On: 11/14/2021 09:29   DG C-Arm 1-60 Min-No Report  Result Date: 11/14/2021 Fluoroscopy was utilized by the requesting physician.  No radiographic interpretation.        Scheduled Meds:  sodium chloride   Intravenous Once   Chlorhexidine Gluconate Cloth  6 each Topical Daily   Chlorhexidine Gluconate Cloth  6 each Topical Daily   feeding supplement  1 Container Oral TID BM   letrozole  2.5 mg Oral Daily   metoprolol tartrate  12.5 mg Oral BID   multivitamin with minerals  1 tablet Oral Daily   pantoprazole  40 mg Oral Daily   sodium chloride flush  3 mL Intravenous Q12H   Continuous Infusions:  sodium chloride 75 mL/hr at 11/14/21 1815     LOS: 2 days    Time spent:25 mins. More than 50% of that time was spent in counseling and/or coordination of care.      Shelly Coss, MD [facility] P11/20/2022, 8:22 AM

## 2021-11-16 ENCOUNTER — Encounter (HOSPITAL_COMMUNITY): Payer: Self-pay | Admitting: Gastroenterology

## 2021-11-16 ENCOUNTER — Telehealth: Payer: Self-pay | Admitting: *Deleted

## 2021-11-16 DIAGNOSIS — C229 Malignant neoplasm of liver, not specified as primary or secondary: Secondary | ICD-10-CM

## 2021-11-16 DIAGNOSIS — K831 Obstruction of bile duct: Secondary | ICD-10-CM | POA: Diagnosis not present

## 2021-11-16 DIAGNOSIS — C3431 Malignant neoplasm of lower lobe, right bronchus or lung: Secondary | ICD-10-CM

## 2021-11-16 DIAGNOSIS — C50212 Malignant neoplasm of upper-inner quadrant of left female breast: Secondary | ICD-10-CM

## 2021-11-16 DIAGNOSIS — Z170 Estrogen receptor positive status [ER+]: Secondary | ICD-10-CM

## 2021-11-16 DIAGNOSIS — C801 Malignant (primary) neoplasm, unspecified: Secondary | ICD-10-CM | POA: Diagnosis not present

## 2021-11-16 LAB — COMPREHENSIVE METABOLIC PANEL
ALT: 62 U/L — ABNORMAL HIGH (ref 0–44)
AST: 35 U/L (ref 15–41)
Albumin: 3 g/dL — ABNORMAL LOW (ref 3.5–5.0)
Alkaline Phosphatase: 198 U/L — ABNORMAL HIGH (ref 38–126)
Anion gap: 10 (ref 5–15)
BUN: 44 mg/dL — ABNORMAL HIGH (ref 8–23)
CO2: 24 mmol/L (ref 22–32)
Calcium: 8.7 mg/dL — ABNORMAL LOW (ref 8.9–10.3)
Chloride: 105 mmol/L (ref 98–111)
Creatinine, Ser: 1.65 mg/dL — ABNORMAL HIGH (ref 0.44–1.00)
GFR, Estimated: 31 mL/min — ABNORMAL LOW (ref >60)
Glucose, Bld: 131 mg/dL — ABNORMAL HIGH (ref 70–99)
Potassium: 3.9 mmol/L (ref 3.5–5.1)
Sodium: 139 mmol/L (ref 135–145)
Total Bilirubin: 4.9 mg/dL — ABNORMAL HIGH (ref 0.3–1.2)
Total Protein: 6.8 g/dL (ref 6.5–8.1)

## 2021-11-16 LAB — CBC WITH DIFFERENTIAL/PLATELET
Abs Immature Granulocytes: 0.08 10*3/uL — ABNORMAL HIGH (ref 0.00–0.07)
Basophils Absolute: 0.1 10*3/uL (ref 0.0–0.1)
Basophils Relative: 1 %
Eosinophils Absolute: 0.2 10*3/uL (ref 0.0–0.5)
Eosinophils Relative: 2 %
HCT: 39 % (ref 36.0–46.0)
Hemoglobin: 12 g/dL (ref 12.0–15.0)
Immature Granulocytes: 1 %
Lymphocytes Relative: 7 %
Lymphs Abs: 0.8 10*3/uL (ref 0.7–4.0)
MCH: 27 pg (ref 26.0–34.0)
MCHC: 30.8 g/dL (ref 30.0–36.0)
MCV: 87.8 fL (ref 80.0–100.0)
Monocytes Absolute: 0.9 10*3/uL (ref 0.1–1.0)
Monocytes Relative: 9 %
Neutro Abs: 8.7 10*3/uL — ABNORMAL HIGH (ref 1.7–7.7)
Neutrophils Relative %: 80 %
Platelets: 180 10*3/uL (ref 150–400)
RBC: 4.44 MIL/uL (ref 3.87–5.11)
RDW: 16.9 % — ABNORMAL HIGH (ref 11.5–15.5)
WBC: 10.7 10*3/uL — ABNORMAL HIGH (ref 4.0–10.5)
nRBC: 0 % (ref 0.0–0.2)

## 2021-11-16 LAB — BRAIN NATRIURETIC PEPTIDE: B Natriuretic Peptide: 395.9 pg/mL — ABNORMAL HIGH (ref 0.0–100.0)

## 2021-11-16 LAB — PROTIME-INR
INR: 1.1 (ref 0.8–1.2)
Prothrombin Time: 14.4 seconds (ref 11.4–15.2)

## 2021-11-16 MED ORDER — HYDRALAZINE HCL 20 MG/ML IJ SOLN
5.0000 mg | Freq: Once | INTRAMUSCULAR | Status: AC
  Administered 2021-11-16: 5 mg via INTRAVENOUS
  Filled 2021-11-16: qty 1

## 2021-11-16 MED ORDER — METOPROLOL TARTRATE 50 MG PO TABS: 50.0000 mg | ORAL_TABLET | Freq: Two times a day (BID) | ORAL | 0 refills | Status: DC

## 2021-11-16 MED ORDER — HEPARIN SOD (PORK) LOCK FLUSH 100 UNIT/ML IV SOLN
500.0000 [IU] | INTRAVENOUS | Status: AC | PRN
  Administered 2021-11-16: 500 [IU]
  Filled 2021-11-16 (×2): qty 5

## 2021-11-16 MED ORDER — SENNOSIDES-DOCUSATE SODIUM 8.6-50 MG PO TABS
2.0000 | ORAL_TABLET | Freq: Every day | ORAL | 0 refills | Status: AC
Start: 2021-11-16 — End: 2021-11-30

## 2021-11-16 MED ORDER — POLYETHYLENE GLYCOL 3350 17 G PO PACK: 17.0000 g | PACK | Freq: Every day | ORAL | 0 refills | Status: DC | PRN

## 2021-11-16 MED ORDER — WARFARIN SODIUM 2.5 MG PO TABS: 2.5000 mg | ORAL_TABLET | Freq: Once | ORAL | Status: DC

## 2021-11-16 MED ORDER — PANTOPRAZOLE SODIUM 40 MG PO TBEC: 40.0000 mg | DELAYED_RELEASE_TABLET | Freq: Every day | ORAL | 0 refills | Status: DC

## 2021-11-16 MED ORDER — FUROSEMIDE 40 MG PO TABS
40.0000 mg | ORAL_TABLET | Freq: Every day | ORAL | Status: DC
  Administered 2021-11-16: 40 mg via ORAL
  Filled 2021-11-16: qty 1

## 2021-11-16 NOTE — Progress Notes (Addendum)
[place] for Warfarin Indication: atrial fibrillation  Allergies  Allergen Reactions   Tape Hives   Contrast Media [Iodinated Diagnostic Agents] Hives   Iohexol Other (See Comments)     Code: HIVES, Desc: hives w/ contrast '08// better w/ benadryl    Sulfa Antibiotics Other (See Comments)    REACTION: unknown   Sulfamethoxazole-Trimethoprim Other (See Comments)    REACTION: unknown    Patient Measurements: Height: 5\' 10"  (177.8 cm) Weight: 117.3 kg (258 lb 9.6 oz) IBW/kg (Calculated) : 68.5  Vital Signs: Temp: 99.1 F (37.3 C) (11/21 0525) Temp Source: Oral (11/21 0525) BP: 159/78 (11/21 0525) Pulse Rate: 104 (11/21 0525)  Labs: Recent Labs    11/14/21 0408 11/15/21 0453 11/16/21 0537  HGB 13.0 12.8 12.0  HCT 40.9 41.1 39.0  PLT 185 193 180  LABPROT 28.1* 17.9* 14.4  INR 2.6* 1.5* 1.1  CREATININE 1.54* 2.11* 1.65*    Estimated Creatinine Clearance: 37.8 mL/min (A) (by C-G formula based on SCr of 1.65 mg/dL (H)).  Medications:  Scheduled:   sodium chloride   Intravenous Once   amLODipine  10 mg Oral Daily   Chlorhexidine Gluconate Cloth  6 each Topical Daily   Chlorhexidine Gluconate Cloth  6 each Topical Daily   feeding supplement  1 Container Oral TID BM   letrozole  2.5 mg Oral Daily   metoprolol tartrate  12.5 mg Oral BID   multivitamin with minerals  1 tablet Oral Daily   pantoprazole  40 mg Oral Daily   senna-docusate  2 tablet Oral QHS   sodium chloride flush  3 mL Intravenous Q12H   Warfarin - Pharmacist Dosing Inpatient   Does not apply q1600   Assessment: 29 yoF presented to ED on 11/17 for evaluation of jaundice with known breast cancer, lung cancer, colon cancer, with mets to the liver.  PMH also includes warfarin for Afib.  Pharmacy is consulted to resume inpatient dosing following ERCP on 11/19.  INR was reportedly elevated at 5 one week ago then elevated at 4.7 three days ago.   PTA warfarin 2.5 mg  daily except 3.75 mg PO daily on Tuesday and Wednesday. Vitamin K 5mg  PO given on 11/16 11/19 2 units FFP administered prior to ERCP  Today, 11/16/2021 INR remains subtherapeutic and lower (not unexpected after holding warfarin  CBC: Hgb and Plt WNL.   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 Ideal choice would be LMWH, depending on affordability and renal function.   Reuel Boom, PharmD, BCPS [phone] 11/16/2021, 7:29 AM

## 2021-11-16 NOTE — TOC Transition Note (Signed)
Transition of Care [facility]) - CM/SW Discharge Note   Patient Details  Name: Tammie [name] MRN: [id number] Date of Birth: 03/23/1941  [facility] CM/SW Contact:  Ross Ludwig, LCSW Phone Number: 11/16/2021, 1:17 PM   Clinical Narrative:     CSW was informed that patient will need palliative to follow outpatient.  CSW left a message with [facility] for them to follow up with patient and family.  CSW also sent referral through [facility].  CSW signing off, no other needs identified.  Final next level of care: Home/Self Care Barriers to Discharge: Barriers Resolved   Patient Goals and CMS Choice Patient states their goals for this hospitalization and ongoing recovery are:: To return back home with palliative. CMS Medicare.gov Compare Post Acute Care list provided to:: Patient Choice offered to / list presented to : Patient  Discharge Placement                       Discharge Plan and Services                                     Social Determinants of Health (SDOH) Interventions     Readmission Risk Interventions No flowsheet data found.

## 2021-11-16 NOTE — Progress Notes (Signed)
Patient discharged with Husband via personal vehicle. Patient taken down via wheelchair. All discharge instructions gone over with patient. No further questions at this time.

## 2021-11-16 NOTE — Telephone Encounter (Signed)
Referral signed.   [name], FNP

## 2021-11-16 NOTE — Discharge Summary (Signed)
Physician Discharge Summary  DEVAEH AMADI LYY:[id number] DOB: [birth year] DOA: 11/12/2021  PCP: [name], FNP  Admit date: 11/12/2021 Discharge date: 11/16/2021  Admitted From: Home Disposition:  Home  Discharge Condition:Stable CODE STATUS:FULL Diet recommendation:  Heart healthy  Brief/Interim Summary: Patient is a 80 year old female with history of breast cancer, lung cancer, colon cancer with mets to liver, congestive heart failure, asthma, atrial fibrillation, CKD stage IIIa, CVA, hypertension, hyperlipidemia who presents with new onset jaundice.  She also reported some pruritus, pale-colored stool, darker urine..  On her PCPs office, she was noticed to be jaundiced, lab work showed elevated bilirubin and she was sent to the emergency department.  On presentation she was hemodynamically stable.  Lab work showed hypokalemia, AKI, elevated liver enzymes, elevated bilirubin, INR of 3.5.  Ultrasound of the abdomen showed gallstones and a prominent common bile duct with intrahepatic dilation but no clearly stone.  CT abdomen/pelvis showed fullness in the soft tissue superior to the pancreatic head concerning for pancreatic cancer versus cholangiocarcinoma.  MRI of the abdomen showed 6.2 x 5.2 x 5.7 cm heterogeneously enhancing lesion in the porta hepatis invading the medial liver and caudate lobe, obstructing the left and right hepatic ducts near the confluence with intrahepatic biliary duct dilatation suggesting metastatic disease.  Pancreatic cancer/cholangiocarcinoma less likely.  Gastroenterology was consulted.  Underwent ERCP on 11/19 with placement of 2 biliary stents.  Her jaundice has improved with the significant decline in the bilirubin.  She is medically stable for discharge to home today.  She needs to follow-up with her PCP, oncologist and also palliative care as an outpatient.  Following problems were addressed during her hospitalization:   Obstructive jaundice secondary to  metastatic disease: Known history of cancer.  Presented with pruritus, pale stools, dark urine, jaundice.  Found to have elevated liver enzymes, bilirubin.  Lipase level normal.  Abdominal imagings as above.  Metastatic disease causing biliary obstruction.  Gastroenterology consulted, she underwent ERCP  with finding of large central hepatic mass causing complicated biliary strictures.  Status post placement of 2 stents.  Biliary cytology was obtained.  GI signed off.  We recommend to follow-up with her oncology as soon as possible.  Bilirubin is declining.   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  Creatinine was elevated on presentation.  Currently on baseline  History of diastolic congestive heart failure: Last echo on 08/2020 showed EF of 60 to 65%, indeterminate diastolic parameters.  Takes Lasix at home.     Hypertension: Continue metoprolol, amlodipine.  Monitor blood pressure at home.  Dose of metoprolol decreased   Paroxysmal A. fib: Currently rate is controlled, currently in normal sinus rhythm.  On metoprolol.  On warfarin at home for anticoagulation.  Presents with supratherapeutic INR of 3.5.    Can resume warfarin now.   Asthma: Currently stable.  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  Status post Keytruda treatment.  Also on letrozole.   Goals of care: Imaging showed progression of metastatic disease causing jaundice.  Very poor prognosis.  We discussed goals of care at bedside.  She wants to remain as full code for now.  She has limited family members.  She knows that she has metastatic disease and does not have good outcome. We  consulted palliative care for further goals of care.  Recommend outpatient follow-up     Discharge Diagnoses:  Principal Problem:   Obstructive jaundice  due to cancer [facility]) Active Problems:   History of CVA (cerebrovascular accident)   Asthma   Atrial fibrillation (HCC)   COPD with chronic bronchitis (HCC)   HTN (hypertension)   Hyperlipidemia with target LDL less than 100   CKD (chronic kidney disease), stage III (HCC)   Liver mass   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 ([place])   Primary adenocarcinoma of colon (HCC)   Chronic diastolic CHF (congestive heart failure) (HCC)   Warfarin anticoagulation   Hyperbilirubinemia   Hypokalemia   Jaundice   Transaminitis   AKI (acute kidney injury) [facility])    Discharge Instructions  Discharge Instructions     Diet - low sodium heart healthy   Complete by: As directed    Discharge instructions   Complete by: As directed    1)Please follow-up with your PCP in a week.  Do a comprehensive metabolic panel test when you follow up 2)Follow up with your oncologist as soon as possible.   Increase activity slowly   Complete by: As directed       Allergies as of 11/16/2021       Reactions   Tape Hives   Contrast Media [iodinated Diagnostic Agents] Hives   Iohexol Other (See Comments)    Code: HIVES, Desc: hives w/ contrast '08// better w/ benadryl   Sulfa Antibiotics Other (See Comments)   REACTION: unknown   Sulfamethoxazole-trimethoprim Other (See Comments)   REACTION: unknown        Medication List     STOP taking these medications    HYDROcodone-acetaminophen 5-325 MG tablet Commonly known as: NORCO/VICODIN       TAKE these medications    acetaminophen 650 MG CR tablet Commonly known as: TYLENOL Take 650 mg by mouth 3 (three) times daily as needed for pain.   albuterol 108 (90 Base) MCG/ACT inhaler Commonly known as: ProAir HFA Inhale 2 puffs into the lungs every 6 (six) hours as needed for wheezing or shortness of breath.   amLODipine 10 MG tablet Commonly known as: NORVASC Take 1 tablet (10 mg total) by mouth daily.   diphenhydrAMINE 25 MG  tablet Commonly known as: BENADRYL Take 25 mg by mouth every 6 (six) hours as needed for allergies.   Fish Oil 1000 MG Caps Take 1 capsule by mouth daily.   FLAXSEED OIL PO Take 1 tablet by mouth at bedtime.   furosemide 20 MG tablet Commonly known as: Lasix Take 1 tablet (20 mg total) by mouth 2 (two) times daily. What changed:  how much to take when to take this   Garlic 3875 MG Caps Take 1,000 mg by mouth daily.   Iron 142 (45 Fe) MG Tbcr Take 1 tablet by mouth every evening.   letrozole 2.5 MG tablet Commonly known as: FEMARA Take 1 tablet (2.5 mg total) by mouth daily.   metoprolol tartrate 50 MG tablet Commonly known as: LOPRESSOR Take 1 tablet (50 mg total) by mouth 2 (two) times daily. What changed:  medication strength See the new instructions.   nystatin powder Commonly known as: MYCOSTATIN/NYSTOP Apply 1 application topically 3 (three) times daily.   pantoprazole 40 MG tablet Commonly known as: PROTONIX Take 1 tablet (40 mg total) by mouth daily. Start taking on: November 17, 2021   polyethylene glycol 17 g packet Commonly known as: MiraLax Take 17 g by mouth daily as needed.   senna-docusate  8.6-50 MG tablet Commonly known as: Senokot-S Take 2 tablets by mouth at bedtime for 14 days.   simvastatin 10 MG tablet Commonly known as: ZOCOR TAKE 1 TABLET BY MOUTH ONCE DAILY AT  6PM   warfarin 2.5 MG tablet Commonly known as: COUMADIN Take as directed. If you are unsure how to take this medication, talk to your nurse or doctor. Original instructions: TAKE ONE TABLET BY MOUTH ONCE DAILY EXCEPT 1/2 TABLET ON FRIDAYS.        Follow-up Information     [name], FNP. Schedule an appointment as soon as possible for a visit in 1 week(s).   Specialty: Family Medicine Contact information: [place] [address] [phone]                Allergies  Allergen Reactions   Tape Hives   Contrast Media [Iodinated  Diagnostic Agents] Hives   Iohexol Other (See Comments)     Code: HIVES, Desc: hives w/ contrast '08// better w/ benadryl    Sulfa Antibiotics Other (See Comments)    REACTION: unknown   Sulfamethoxazole-Trimethoprim Other (See Comments)    REACTION: unknown    Consultations: GI, oncology   Procedures/Studies: CT ABDOMEN PELVIS WO CONTRAST  Result Date: 11/12/2021 CLINICAL DATA:  Abdominal pain, biliary obstruction suspected. New onset jaundice. EXAM: CT ABDOMEN AND PELVIS WITHOUT CONTRAST TECHNIQUE: Multidetector CT imaging of the abdomen and pelvis was performed following the standard protocol without IV contrast. COMPARISON:  CT abdomen and pelvis 08/04/2021. FINDINGS: Lower chest: Subsolid right lower lobe pulmonary nodule appears unchanged with solid component measuring 9 mm. Lung bases are otherwise clear. Hepatobiliary: Gallstones are again seen. There is no pericholecystic inflammation. There is new mild to moderate intrahepatic biliary ductal dilatation. Common bile duct not well seen. Pancreas: There is soft tissue fullness in the periportal region measuring 4.7 x 2.3 cm image 2/24. This is abutting the superior margin of the pancreatic head. The pancreas otherwise appears within normal limits. There is no evidence for pancreatic ductal dilatation or surrounding inflammation. Spleen: Normal in size without focal abnormality. Adrenals/Urinary Tract: The bilateral adrenal glands are within normal limits. There is a stable 1 cm calculus in the right renal pelvis. There is also stable 5 mm calculus in the lower pole the left kidney. No ureteral calculi are seen. There is no hydronephrosis. Left renal cysts are again noted. The largest cyst measures 8.5 cm posteriorly similar to the prior examination. Small hyperdense lesion in the right kidney is incompletely characterized and may represent a proteinaceous cyst. This is unchanged from prior examination. The bladder is within normal limits.  Stomach/Bowel: Stomach is within normal limits. Appendix appears normal. No evidence of bowel wall thickening, distention, or inflammatory changes. Vascular/Lymphatic: Aortic atherosclerosis. No enlarged abdominal or pelvic lymph nodes. Reproductive: Status post hysterectomy. No adnexal masses. Other: There is no ascites or free air. There is no focal abdominal wall hernia. Musculoskeletal: Multilevel degenerative changes affect the spine. No focal osseous lesions are identified. IMPRESSION: 1. Ill-defined soft tissue fullness in the right periportal region superior to the pancreatic head. There is mild to moderate intrahepatic biliary ductal dilatation. Findings are worrisome for primary pancreatic neoplasm or cholangiocarcinoma. Further evaluation with MRI recommended. 2.  Cholelithiasis.  No additional evidence for cholecystitis. 3.   Stable left renal calculus and right renal pelvis calculus. 4. Subsolid nodule in the right lower lobe is unchanged. Metastatic disease or primary neoplasm not excluded. 5.  Aortic Atherosclerosis (ICD10-I70.0). Electronically  Signed   By: Ronney Asters M.D.   On: 11/12/2021 18:50   MR 3D Recon At Scanner  Result Date: 11/13/2021 CLINICAL DATA:  Obstructive jaundice. History of breast cancer, lung cancer, and colon cancer with mets to. EXAM: MRI ABDOMEN WITHOUT AND WITH CONTRAST (INCLUDING MRCP) TECHNIQUE: Multiplanar multisequence MR imaging of the abdomen was performed both before and after the administration of intravenous contrast. Heavily T2-weighted images of the biliary and pancreatic ducts were obtained, and three-dimensional MRCP images were rendered by post processing. CONTRAST:  70mL GADAVIST GADOBUTROL 1 MMOL/ML IV SOLN COMPARISON:  Abdomen/pelvis CT 11/12/2021. Abdomen pelvis CT 08/04/2021. FINDINGS: Lower chest: Unremarkable. Hepatobiliary: 6.2 x 5.2 x 5.7 cm heterogeneously enhancing lesion is identified in the central porta hepatis invading the medial liver and  caudate lobe. Lesion restricts diffusion and is well visualized on diffusion-weighted imaging (see image 50 of series 11). This mass has been progressive over the last 2 CT scans although it was extremely subtle on those studies performed without intravenous contrast material, mimicking background liver attenuation. On today's exam, this lesion obstructs the left and right hepatic ducts at the confluence. Lesion generates substantial mass-effect on and may actually invade the main portal vein although the portal vein does appear to remain patent. No evidence for filling defect in the hepatic venous anatomy. Intrahepatic biliary duct dilatation is seen in the left and right hepatic lobes with obliteration of the common duct in the hepatoduodenal ligament. Common bile duct reconstitutes distal to the mass and is normal diameter leading into the ampulla. Gallbladder is contracted around a 2.5 cm gallstone. Pancreas: Above described mass in the porta hepatis does not appear to arise from the pancreas by MR imaging. No dilatation of the main duct. No intraparenchymal cyst. No peripancreatic edema. Spleen:  No splenomegaly. No focal mass lesion. Adrenals/Urinary Tract: No adrenal nodule or mass. Multiple bilateral renal cysts including dominant exophytic 9.2 cm cyst upper pole left kidney. Stomach/Bowel: Stomach is unremarkable. No gastric wall thickening. No evidence of outlet obstruction. No small bowel or colonic dilatation within the visualized abdomen. Vascular/Lymphatic: No abdominal aortic aneurysm. Although difficult to assess given motion artifact, the mass lesion in the porta hepatis abuts the common hepatic artery and appears to encase the left hepatic artery. Right hepatic artery may be replaced arising from the SMA, although this is not well visualized. See above for discussion of portal venous anatomy. Superior mesenteric vein and splenic vein are patent.No retroperitoneal lymphadenopathy. Other:  No  substantial intraperitoneal free fluid. Musculoskeletal: No focal suspicious marrow enhancement within the visualized bony anatomy. IMPRESSION: 1. 6.2 x 5.2 x 5.7 cm heterogeneously enhancing lesion in the porta hepatis invading the medial liver and caudate lobe. This mass obstructs the left and right hepatic ducts near the confluence with intrahepatic biliary duct dilatation. Lesion generates substantial mass-effect on and may actually invade the main portal vein although the portal vein does appear to remain patent (assessment markedly limited due to motion artifact). MR imaging suggests that this is not a primary pancreatic neoplasm. Metastatic disease would be a distinct consideration. Peripheral cholangiocarcinoma and hepatocellular carcinoma are not excluded but felt to be less likely based on enhancement characteristics. 2. Cholelithiasis. 3. Bilateral renal cysts. Electronically Signed   By: Misty [name] M.D.   On: 11/13/2021 05:42   DG ERCP  Result Date: 11/14/2021 CLINICAL DATA:  ERCP EXAM: ERCP TECHNIQUE: Multiple spot images obtained with the fluoroscopic device and submitted for interpretation post-procedure. FLUOROSCOPY TIME:  Refer to procedure  report. COMPARISON:  None. FINDINGS: A total of 4 fluoroscopic spot films are submitted for review taken during ERCP procedure. Images demonstrate scope overlying the mid abdomen. The common bile duct is catheterized, and contrast injection of the intrahepatic biliary tree demonstrate dilated biliary ducts. There is a metallic stent which appears to be deployed in the left hepatic duct. A plastic biliary stent appears to be placed into the common bile duct. IMPRESSION: Fluoroscopic images taken during ERCP as described. Refer to procedure report for full details. These images were submitted for radiologic interpretation only. Please see the procedural report for the amount of contrast and the fluoroscopy time utilized. Electronically Signed   By: Albin Felling M.D.   On: 11/14/2021 09:29   DG C-Arm 1-60 Min-No Report  Result Date: 11/14/2021 Fluoroscopy was utilized by the requesting physician.  No radiographic interpretation.   MR ABDOMEN MRCP W WO CONTAST  Result Date: 11/13/2021 CLINICAL DATA:  Obstructive jaundice. History of breast cancer, lung cancer, and colon cancer with mets to. EXAM: MRI ABDOMEN WITHOUT AND WITH CONTRAST (INCLUDING MRCP) TECHNIQUE: Multiplanar multisequence MR imaging of the abdomen was performed both before and after the administration of intravenous contrast. Heavily T2-weighted images of the biliary and pancreatic ducts were obtained, and three-dimensional MRCP images were rendered by post processing. CONTRAST:  85mL GADAVIST GADOBUTROL 1 MMOL/ML IV SOLN COMPARISON:  Abdomen/pelvis CT 11/12/2021. Abdomen pelvis CT 08/04/2021. FINDINGS: Lower chest: Unremarkable. Hepatobiliary: 6.2 x 5.2 x 5.7 cm heterogeneously enhancing lesion is identified in the central porta hepatis invading the medial liver and caudate lobe. Lesion restricts diffusion and is well visualized on diffusion-weighted imaging (see image 50 of series 11). This mass has been progressive over the last 2 CT scans although it was extremely subtle on those studies performed without intravenous contrast material, mimicking background liver attenuation. On today's exam, this lesion obstructs the left and right hepatic ducts at the confluence. Lesion generates substantial mass-effect on and may actually invade the main portal vein although the portal vein does appear to remain patent. No evidence for filling defect in the hepatic venous anatomy. Intrahepatic biliary duct dilatation is seen in the left and right hepatic lobes with obliteration of the common duct in the hepatoduodenal ligament. Common bile duct reconstitutes distal to the mass and is normal diameter leading into the ampulla. Gallbladder is contracted around a 2.5 cm gallstone. Pancreas: Above described  mass in the porta hepatis does not appear to arise from the pancreas by MR imaging. No dilatation of the main duct. No intraparenchymal cyst. No peripancreatic edema. Spleen:  No splenomegaly. No focal mass lesion. Adrenals/Urinary Tract: No adrenal nodule or mass. Multiple bilateral renal cysts including dominant exophytic 9.2 cm cyst upper pole left kidney. Stomach/Bowel: Stomach is unremarkable. No gastric wall thickening. No evidence of outlet obstruction. No small bowel or colonic dilatation within the visualized abdomen. Vascular/Lymphatic: No abdominal aortic aneurysm. Although difficult to assess given motion artifact, the mass lesion in the porta hepatis abuts the common hepatic artery and appears to encase the left hepatic artery. Right hepatic artery may be replaced arising from the SMA, although this is not well visualized. See above for discussion of portal venous anatomy. Superior mesenteric vein and splenic vein are patent.No retroperitoneal lymphadenopathy. Other:  No substantial intraperitoneal free fluid. Musculoskeletal: No focal suspicious marrow enhancement within the visualized bony anatomy. IMPRESSION: 1. 6.2 x 5.2 x 5.7 cm heterogeneously enhancing lesion in the porta hepatis invading the medial liver and caudate lobe. This  mass obstructs the left and right hepatic ducts near the confluence with intrahepatic biliary duct dilatation. Lesion generates substantial mass-effect on and may actually invade the main portal vein although the portal vein does appear to remain patent (assessment markedly limited due to motion artifact). MR imaging suggests that this is not a primary pancreatic neoplasm. Metastatic disease would be a distinct consideration. Peripheral cholangiocarcinoma and hepatocellular carcinoma are not excluded but felt to be less likely based on enhancement characteristics. 2. Cholelithiasis. 3. Bilateral renal cysts. Electronically Signed   By: Misty [name] M.D.   On: 11/13/2021  05:42   US Abdomen Limited RUQ (LIVER/GB)  Result Date: 11/12/2021 CLINICAL DATA:  History of jaundice, known metastatic colon carcinoma EXAM: ULTRASOUND ABDOMEN LIMITED RIGHT UPPER QUADRANT COMPARISON:  CT from 08/04/2021 FINDINGS: Gallbladder: Cholelithiasis is identified without wall thickening or pericholecystic fluid. The overall appearance is similar seen on prior CT examination. Common bile duct: Diameter: 11.3 mm. Liver: Increased in echogenicity without focal mass. Portal vein is patent on color Doppler imaging with normal direction of blood flow towards the liver. Biliary ductal dilatation is noted. Other: None. IMPRESSION: Cholelithiasis without complicating factors. Prominent common bile duct and intrahepatic dilatation without definitive stone. This is suggestive of distal common bile duct stone and will be better evaluated on upcoming CT examination. Fatty liver Electronically Signed   By: Inez Catalina M.D.   On: 11/12/2021 18:26      Subjective: Patient seen and examined at the bedside this morning.  Hemodynamically stable for discharge.  Discharge Exam: Vitals:   11/16/21 1014 11/16/21 1015  BP: (!) 153/64   Pulse: 86 (!) 114  Resp:    Temp:    SpO2:  94%   Vitals:   11/16/21 0156 11/16/21 0525 11/16/21 1014 11/16/21 1015  BP: (!) 161/77 (!) 159/78 (!) 153/64   Pulse: 87 (!) 104 86 (!) 114  Resp:  18    Temp: 99.1 F (37.3 C) 99.1 F (37.3 C)    TempSrc: Oral Oral    SpO2:  91%  94%  Weight:      Height:        General: Pt is alert, awake, not in acute distress,obese Cardiovascular: RRR, S1/S2 +, no rubs, no gallops Respiratory: CTA bilaterally, no wheezing, no rhonchi Abdominal: Soft, NT, ND, bowel sounds + Extremities: no edema, no cyanosis    The results of significant diagnostics from this hospitalization (including imaging, microbiology, ancillary and laboratory) are listed below for reference.     Microbiology: Recent Results (from the past 240  hour(s))  Microscopic Examination     Status: Abnormal   Collection Time: 11/12/21  2:32 PM   Urine  Result Value Ref Range Status   WBC, UA 6-10 (A) 0 - 5 /hpf Final   RBC None seen 0 - 2 /hpf Final   Epithelial Cells (non renal) 0-10 0 - 10 /hpf Final   Renal Epithel, UA None seen None seen /hpf Final   Bacteria, UA Many (A) None seen/Few Final  Resp Panel by RT-PCR (Flu A&B, Covid) Nasopharyngeal Swab     Status: None   Collection Time: 11/12/21  6:13 PM   Specimen: Nasopharyngeal Swab; Nasopharyngeal(NP) swabs in vial transport medium  Result Value Ref Range Status   SARS Coronavirus 2 by RT PCR NEGATIVE NEGATIVE Final    Comment: (NOTE) SARS-CoV-2 target nucleic acids are NOT DETECTED.  The SARS-CoV-2 RNA is generally detectable in upper respiratory specimens during the acute phase of infection.  The lowest concentration of SARS-CoV-2 viral copies this assay can detect is 138 copies/mL. A negative result does not preclude SARS-Cov-2 infection and should not be used as the sole basis for treatment or other patient management decisions. A negative result may occur with  improper specimen collection/handling, submission of specimen other than nasopharyngeal swab, presence of viral mutation(s) within the areas targeted by this assay, and inadequate number of viral copies(<138 copies/mL). A negative result must be combined with clinical observations, patient history, and epidemiological information. The expected result is Negative.  Fact Sheet for Patients:  EntrepreneurPulse.com.au  Fact Sheet for Healthcare Providers:  IncredibleEmployment.be  This test is no t yet approved or cleared by the [place] FDA and  has been authorized for detection and/or diagnosis of SARS-CoV-2 by FDA under an Emergency Use Authorization (EUA). This EUA will remain  in effect (meaning this test can be used) for the duration of the COVID-19 declaration under  Section 564(b)(1) of the Act, 21 U.S.C.section 360bbb-3(b)(1), unless the authorization is terminated  or revoked sooner.       Influenza A by PCR NEGATIVE NEGATIVE Final   Influenza B by PCR NEGATIVE NEGATIVE Final    Comment: (NOTE) The Xpert Xpress SARS-CoV-2/FLU/RSV plus assay is intended as an aid in the diagnosis of influenza from Nasopharyngeal swab specimens and should not be used as a sole basis for treatment. Nasal washings and aspirates are unacceptable for Xpert Xpress SARS-CoV-2/FLU/RSV testing.  Fact Sheet for Patients: EntrepreneurPulse.com.au  Fact Sheet for Healthcare Providers: IncredibleEmployment.be  This test is not yet approved or cleared by the [place] FDA and has been authorized for detection and/or diagnosis of SARS-CoV-2 by FDA under an Emergency Use Authorization (EUA). This EUA will remain in effect (meaning this test can be used) for the duration of the COVID-19 declaration under Section 564(b)(1) of the Act, 21 U.S.C. section 360bbb-3(b)(1), unless the authorization is terminated or revoked.  Performed at [facility], [place] [address] [place], [place] [address]      Labs: BNP (last 3 results) Recent Labs    05/22/21 1153 11/16/21 0537  BNP 202.2* 527.7*   Basic Metabolic Panel: Recent Labs  Lab 11/12/21 1812 11/12/21 1813 11/13/21 0518 11/14/21 0408 11/15/21 0453 11/16/21 0537  NA  --  140 140 141 137 139  K  --  3.0* 3.0* 3.3* 4.4 3.9  CL  --  105 106 109 107 105  CO2  --  22 23 22 22 24   GLUCOSE  --  114* 124* 112* 165* 131*  BUN  --  49* 48* 37* 38* 44*  CREATININE  --  2.12* 1.92* 1.54* 2.11* 1.65*  CALCIUM  --  9.1 8.6* 8.5* 8.5* 8.7*  MG 2.0  --   --   --   --   --    Liver Function Tests: Recent Labs  Lab 11/12/21 1813 11/13/21 0518 11/14/21 0408 11/15/21 0453 11/16/21 0537  AST 100* 82* 95* 62* 35  ALT 105* 86* 90* 77* 62*  ALKPHOS 275* 263* 264*  254* 198*  BILITOT 11.9* 10.5* 12.0* 7.1* 4.9*  PROT 7.5 6.6 6.4* 6.8 6.8  ALBUMIN 3.3* 3.1* 2.9* 3.1* 3.0*   Recent Labs  Lab 11/12/21 1813  LIPASE 51   No results for input(s): AMMONIA in the last 168 hours. CBC: Recent Labs  Lab 11/12/21 1813 11/13/21 0518 11/14/21 0408 11/15/21 0453 11/16/21 0537  WBC 6.6 5.6 5.3 8.3 10.7*  NEUTROABS 4.7  --  3.4  7.1 8.7*  HGB 14.2 13.1 13.0 12.8 12.0  HCT 44.0 40.8 40.9 41.1 39.0  MCV 84.9 84.3 84.9 87.1 87.8  PLT 209 174 185 193 180   Cardiac Enzymes: No results for input(s): CKTOTAL, CKMB, CKMBINDEX, TROPONINI in the last 168 hours. BNP: Invalid input(s): POCBNP CBG: No results for input(s): GLUCAP in the last 168 hours. D-Dimer No results for input(s): DDIMER in the last 72 hours. Hgb A1c No results for input(s): HGBA1C in the last 72 hours. Lipid Profile No results for input(s): CHOL, HDL, LDLCALC, TRIG, CHOLHDL, LDLDIRECT in the last 72 hours. Thyroid function studies No results for input(s): TSH, T4TOTAL, T3FREE, THYROIDAB in the last 72 hours.  Invalid input(s): FREET3 Anemia work up No results for input(s): VITAMINB12, FOLATE, FERRITIN, TIBC, IRON, RETICCTPCT in the last 72 hours. Urinalysis    Component Value Date/Time   COLORURINE YELLOW 10/15/2019 1945   APPEARANCEUR Cloudy (A) 11/12/2021 1432   LABSPEC 1.016 10/15/2019 1945   LABSPEC 1.020 03/03/2017 1501   PHURINE 5.0 10/15/2019 1945   GLUCOSEU Negative 11/12/2021 1432   GLUCOSEU Negative 03/03/2017 1501   HGBUR SMALL (A) 10/15/2019 1945   BILIRUBINUR 2+ (A) 11/12/2021 1432   BILIRUBINUR Negative 03/03/2017 1501   KETONESUR 5 (A) 10/15/2019 1945   PROTEINUR Negative 11/12/2021 1432   PROTEINUR 100 (A) 10/15/2019 1945   UROBILINOGEN 0.2 03/03/2017 1501   NITRITE Positive (A) 11/12/2021 1432   NITRITE NEGATIVE 10/15/2019 1945   LEUKOCYTESUR 1+ (A) 11/12/2021 1432   LEUKOCYTESUR NEGATIVE 10/15/2019 1945   LEUKOCYTESUR Small 03/03/2017 1501   Sepsis  Labs Invalid input(s): PROCALCITONIN,  WBC,  LACTICIDVEN Microbiology Recent Results (from the past 240 hour(s))  Microscopic Examination     Status: Abnormal   Collection Time: 11/12/21  2:32 PM   Urine  Result Value Ref Range Status   WBC, UA 6-10 (A) 0 - 5 /hpf Final   RBC None seen 0 - 2 /hpf Final   Epithelial Cells (non renal) 0-10 0 - 10 /hpf Final   Renal Epithel, UA None seen None seen /hpf Final   Bacteria, UA Many (A) None seen/Few Final  Resp Panel by RT-PCR (Flu A&B, Covid) Nasopharyngeal Swab     Status: None   Collection Time: 11/12/21  6:13 PM   Specimen: Nasopharyngeal Swab; Nasopharyngeal(NP) swabs in vial transport medium  Result Value Ref Range Status   SARS Coronavirus 2 by RT PCR NEGATIVE NEGATIVE Final    Comment: (NOTE) SARS-CoV-2 target nucleic acids are NOT DETECTED.  The SARS-CoV-2 RNA is generally detectable in upper respiratory specimens during the acute phase of infection. The lowest concentration of SARS-CoV-2 viral copies this assay can detect is 138 copies/mL. A negative result does not preclude SARS-Cov-2 infection and should not be used as the sole basis for treatment or other patient management decisions. A negative result may occur with  improper specimen collection/handling, submission of specimen other than nasopharyngeal swab, presence of viral mutation(s) within the areas targeted by this assay, and inadequate number of viral copies(<138 copies/mL). A negative result must be combined with clinical observations, patient history, and epidemiological information. The expected result is Negative.  Fact Sheet for Patients:  EntrepreneurPulse.com.au  Fact Sheet for Healthcare Providers:  IncredibleEmployment.be  This test is no t yet approved or cleared by the [place] FDA and  has been authorized for detection and/or diagnosis of SARS-CoV-2 by FDA under an Emergency Use Authorization (EUA). This EUA  will remain  in effect (meaning this test  can be used) for the duration of the COVID-19 declaration under Section 564(b)(1) of the Act, 21 U.S.C.section 360bbb-3(b)(1), unless the authorization is terminated  or revoked sooner.       Influenza A by PCR NEGATIVE NEGATIVE Final   Influenza B by PCR NEGATIVE NEGATIVE Final    Comment: (NOTE) The Xpert Xpress SARS-CoV-2/FLU/RSV plus assay is intended as an aid in the diagnosis of influenza from Nasopharyngeal swab specimens and should not be used as a sole basis for treatment. Nasal washings and aspirates are unacceptable for Xpert Xpress SARS-CoV-2/FLU/RSV testing.  Fact Sheet for Patients: EntrepreneurPulse.com.au  Fact Sheet for Healthcare Providers: IncredibleEmployment.be  This test is not yet approved or cleared by the [place] FDA and has been authorized for detection and/or diagnosis of SARS-CoV-2 by FDA under an Emergency Use Authorization (EUA). This EUA will remain in effect (meaning this test can be used) for the duration of the COVID-19 declaration under Section 564(b)(1) of the Act, 21 U.S.C. section 360bbb-3(b)(1), unless the authorization is terminated or revoked.  Performed at [facility], [place] [address] [place], [place] [address]     Please note: You were cared for by a hospitalist during your hospital stay. Once you are discharged, your primary care physician will handle any further medical issues. Please note that NO REFILLS for any discharge medications will be authorized once you are discharged, as it is imperative that you return to your primary care physician (or establish a relationship with a primary care physician if you do not have one) for your post hospital discharge needs so that they can reassess your need for medications and monitor your lab values.    Time coordinating discharge: 40 minutes  SIGNED:   Shelly Coss, MD  [facility] 11/16/2021, 11:09 AM Pager [phone]  If 7PM-7AM, please contact night-coverage [email or web address] Password [email or web address]

## 2021-11-16 NOTE — Evaluation (Signed)
Physical Therapy Evaluation Patient Details Name: Tammie [name] MRN: [id number] DOB: 08-03-1941 Today's Date: 11/16/2021  History of Present Illness  80 y.o. 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 fib, CKD, CVA, hypertension, hyperlipidemia admitted on 11/12/2021 with new onset jaundice.  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  Palliative consulted for goals of care.  Clinical Impression  Pt presents with general fatigue and SOB with activity affecting her mobility secondary to the above diagnosis. Pt is ambulating Independently in the hospital room. Pt did have some noticeable wheezing and SOB with activity. O2 sats 92-94% on RA. Educated on purse lip breathing and monitoring her O2 sats at home. Recommend frequent walks at home indoors and pt will have a friend who can assist her to medical appointments. Pt is at her baseline of functioning and is safe to d/c home today. Pt is d/c from acute PT services.     Recommendations for follow up therapy are one component of a multi-disciplinary discharge planning process, led by the attending physician.  Recommendations may be updated based on patient status, additional functional criteria and insurance authorization.  Follow Up Recommendations No PT follow up    Assistance Recommended at Discharge Other (comment) (Indep household ambulation, supervision outside ambulation)  Functional Status Assessment    Equipment Recommendations  None recommended by PT    Recommendations for Other Services       Precautions / Restrictions Precautions Precautions: Other (comment) Precaution Comments: SOB with activity, monitor O2 sats, some wheezing noted with activity      Mobility  Bed Mobility Overal bed mobility: Independent                   Transfers Overall transfer level: Modified independent Equipment used: None                    Ambulation/Gait Ambulation/Gait assistance: Modified independent (Device/Increase time) Gait Distance (Feet): 40 Feet Assistive device: None Gait Pattern/deviations: Step-through pattern;Decreased stride length Gait velocity: decreased     General Gait Details: ambulated in room, pt with no LOB but was cautious and reached out for objects to hold onto for stability. SOB and wheezing noted with activity. Pt decined walking in the hall doe to not having undergarmet.  Stairs            Wheelchair Mobility    Modified Rankin (Stroke Patients Only)       Balance Overall balance assessment: Mild deficits observed, not formally tested                                           Pertinent Vitals/Pain Pain Assessment: No/denies pain    Home Living Family/patient expects to be discharged to:: Private residence Living Arrangements: Non-relatives/Friends Available Help at Discharge: Friend(s) Type of Home: House Home Access: Stairs to enter Entrance Stairs-Rails: None Entrance Stairs-Number of Steps: 1-2   Home Layout: Two level;Able to live on main level with bedroom/bathroom Home Equipment: Rolling Walker (2 wheels);Cane - single point Additional Comments: Pt has a friend who helps her to Dr appointments. He does not provide any physical assistance.    Prior Function Prior Level of Function : Independent/Modified Independent  Hand Dominance        Extremity/Trunk Assessment        Lower Extremity Assessment Lower Extremity Assessment: Overall WFL for tasks assessed    Cervical / Trunk Assessment Cervical / Trunk Assessment: Normal  Communication   Communication: HOH  Cognition Arousal/Alertness: Awake/alert Behavior During Therapy: WFL for tasks assessed/performed Overall Cognitive Status: Within Functional  Limits for tasks assessed                                          General Comments General comments (skin integrity, edema, etc.): increased edema in distal LE    Exercises     Assessment/Plan    PT Assessment Patient does not need any further PT services  PT Problem List         PT Treatment Interventions      PT Goals (Current goals can be found in the Care Plan section)  Acute Rehab PT Goals Patient Stated Goal: to return home    Frequency     Barriers to discharge        Co-evaluation               AM-PAC PT "6 Clicks" Mobility  Outcome Measure Help needed turning from your back to your side while in a flat bed without using bedrails?: None Help needed moving from lying on your back to sitting on the side of a flat bed without using bedrails?: None Help needed moving to and from a bed to a chair (including a wheelchair)?: None Help needed standing up from a chair using your arms (e.g., wheelchair or bedside chair)?: None Help needed to walk in hospital room?: None Help needed climbing 3-5 steps with a railing? : A Little 6 Click Score: 23    End of Session   Activity Tolerance: Patient limited by fatigue Patient left: in chair;with call bell/phone within reach Nurse Communication: Mobility status PT Visit Diagnosis: Unsteadiness on feet (R26.81);Muscle weakness (generalized) (M62.81)    Time: 8338-2505 PT Time Calculation (min) (ACUTE ONLY): 16 min   Charges:   PT Evaluation $PT Eval Low Complexity: 1 Low           Lelon Mast 11/16/2021, 10:25 AM

## 2021-11-16 NOTE — Care Management Important Message (Signed)
Important Message  Patient Details IM Letter placed in Patients room. Name: Tammie [name] MRN: [id number] Date of Birth: 02/20/1941   Medicare Important Message Given:  Yes     Kerin Salen 11/16/2021, 10:31 AM

## 2021-11-16 NOTE — Telephone Encounter (Signed)
Would like order placed for Palliative [facility] [place] Please sign pending referral
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
## 2021-11-17 ENCOUNTER — Telehealth: Payer: Self-pay | Admitting: Medical Oncology

## 2021-11-17 LAB — CYTOLOGY - NON PAP

## 2021-11-17 NOTE — Telephone Encounter (Signed)
D/ yesterday. The hospitalist instructed her to contact [name] to be seen before 12/12. Her scans were done inpt

## 2021-11-18 ENCOUNTER — Other Ambulatory Visit: Payer: Self-pay

## 2021-11-18 ENCOUNTER — Telehealth: Payer: Self-pay | Admitting: *Deleted

## 2021-11-18 DIAGNOSIS — I4811 Longstanding persistent atrial fibrillation: Secondary | ICD-10-CM

## 2021-11-18 DIAGNOSIS — C229 Malignant neoplasm of liver, not specified as primary or secondary: Secondary | ICD-10-CM

## 2021-11-18 NOTE — Telephone Encounter (Signed)
Fax received mdINR PT/INR self testing service Test date/time 11/17/21 417 pm INR 1.1

## 2021-11-18 NOTE — Telephone Encounter (Signed)
Change coumadin as follows:   Take 2 pills (2.5 mg tab X 2) today and tomorrow, then start taking 1 pill daily including Fridays. Recheck Monday.

## 2021-11-18 NOTE — Telephone Encounter (Signed)
Patient aware and verbalized understanding. °

## 2021-11-24 ENCOUNTER — Encounter: Payer: Self-pay | Admitting: Family

## 2021-11-24 ENCOUNTER — Other Ambulatory Visit: Payer: Self-pay

## 2021-11-24 ENCOUNTER — Ambulatory Visit (INDEPENDENT_AMBULATORY_CARE_PROVIDER_SITE_OTHER): Payer: HMO | Admitting: Family

## 2021-11-24 VITALS — BP 123/71 | HR 70 | Temp 97.5°F | Ht 70.0 in | Wt 260.2 lb

## 2021-11-24 DIAGNOSIS — Z5181 Encounter for therapeutic drug level monitoring: Secondary | ICD-10-CM

## 2021-11-24 DIAGNOSIS — C229 Malignant neoplasm of liver, not specified as primary or secondary: Secondary | ICD-10-CM | POA: Diagnosis not present

## 2021-11-24 DIAGNOSIS — Z09 Encounter for follow-up examination after completed treatment for conditions other than malignant neoplasm: Secondary | ICD-10-CM

## 2021-11-24 DIAGNOSIS — I4811 Longstanding persistent atrial fibrillation: Secondary | ICD-10-CM | POA: Diagnosis not present

## 2021-11-24 DIAGNOSIS — I10 Essential (primary) hypertension: Secondary | ICD-10-CM

## 2021-11-24 DIAGNOSIS — K831 Obstruction of bile duct: Secondary | ICD-10-CM

## 2021-11-24 DIAGNOSIS — N1832 Chronic kidney disease, stage 3b: Secondary | ICD-10-CM

## 2021-11-24 DIAGNOSIS — Z7901 Long term (current) use of anticoagulants: Secondary | ICD-10-CM

## 2021-11-24 DIAGNOSIS — C801 Malignant (primary) neoplasm, unspecified: Secondary | ICD-10-CM

## 2021-11-24 LAB — COAGUCHEK XS/INR WAIVED
INR: 1.4 — ABNORMAL HIGH (ref 0.9–1.1)
Prothrombin Time: 17.4 s

## 2021-11-24 NOTE — Patient Instructions (Signed)
Description   INR  1.4 (too thick) Goal-2-3  Take 5 mg (2 tablets)  today (11/24/21) then resume 2.5 mg  (1 tablet) daily.   Recheck in [place].

## 2021-11-24 NOTE — Progress Notes (Signed)
Subjective:    Patient ID: Tammie [name], female    DOB: Mar 08, 1941, 80 y.o.   MRN: [id number]  No chief complaint on file.  PT presents to the office today to recheck INR and hospital follow up. She was discharged from the hospital on 11/16/21 after having obstructive jaundice due to cancer.   She had a ERCP on 11/14/21. She is on a wait list to see her Oncologists for follow up.   Her INR is 1.4. She had to hold her warfarin for her ERCP. She restarted taking 2.5 mg today.   She has a palliative provider coming to her house today. And home health nurse is coming tomorrow.   She is complaining of a erythemas blister rash on her right lateral breast.   She has CKD and COPD. She has SOB with exertion.  Hypertension This is a chronic problem. The current episode started more than 1 year ago. The problem has been resolved since onset. The problem is controlled. Associated symptoms include malaise/fatigue, peripheral edema and shortness of breath. Risk factors for coronary artery disease include dyslipidemia, obesity and sedentary lifestyle. The current treatment provides moderate improvement.     Review of Systems  Constitutional:  Positive for malaise/fatigue.  Respiratory:  Positive for shortness of breath.   All other systems reviewed and are negative.     Objective:   Physical Exam Vitals reviewed.  Constitutional:      General: She is not in acute distress.    Appearance: She is well-developed.  HENT:     Head: Normocephalic and atraumatic.     Right Ear: Tympanic membrane normal.     Left Ear: Tympanic membrane normal.  Eyes:     Pupils: Pupils are equal, round, and reactive to light.  Neck:     Thyroid: No thyromegaly.  Cardiovascular:     Rate and Rhythm: Normal rate and regular rhythm.     Heart sounds: Normal heart sounds. No murmur heard. Pulmonary:     Effort: Pulmonary effort is normal. No respiratory distress.     Breath sounds: Normal breath sounds. No  wheezing.  Abdominal:     General: Bowel sounds are normal. There is no distension.     Palpations: Abdomen is soft.     Tenderness: There is no abdominal tenderness.  Musculoskeletal:        General: No tenderness. Normal range of motion.     Cervical back: Normal range of motion and neck supple.  Skin:    General: Skin is warm and dry.     Comments: Erythemas blister area on right breast approx 7.5X4 cm, no discharge  Neurological:     Mental Status: She is alert and oriented to person, place, and time.     Cranial Nerves: No cranial nerve deficit.     Deep Tendon Reflexes: Reflexes are normal and symmetric.  Psychiatric:        Behavior: Behavior normal.        Thought Content: Thought content normal.        Judgment: Judgment normal.       BP 123/71   Pulse 70   Temp (!) 97.5 F (36.4 C) (Temporal)   Ht '5\' 10"'  (1.778 m)   Wt 260 lb 3.2 oz (118 kg)   BMI 37.33 kg/m      Assessment & Plan:  Tammie [name] comes in today with chief complaint of Medical Management of Chronic Issues   Diagnosis and orders addressed:  1. Longstanding persistent atrial fibrillation ([place]) Description   INR  1.4 (too thick) Goal-2-3  Take 5 mg (2 tablets)  today (11/24/21) then resume 2.5 mg  (1 tablet) daily.   Recheck in [place].       - CoaguChek XS/INR Waived - CBC with Differential/Platelet - BMP8+EGFR - Hepatic function panel  2. Hospital discharge follow-up   3. Primary hypertension  4. 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 ([place])   5. Obstructive jaundice due to cancer (HCC)  6. Stage 3b chronic kidney disease (HCC)  7. Warfarin anticoagulation  8. Encounter for therapeutic drug monitoring   Labs pending [facility] follow up Health Maintenance reviewed Diet and exercise encouraged  Follow up plan: 1-2 weeks    [name], FNP

## 2021-11-25 LAB — CBC WITH DIFFERENTIAL/PLATELET
Basophils Absolute: 0.1 10*3/uL (ref 0.0–0.2)
Basos: 1 %
EOS (ABSOLUTE): 0.4 10*3/uL (ref 0.0–0.4)
Eos: 5 %
Hematocrit: 41.1 % (ref 34.0–46.6)
Hemoglobin: 13.2 g/dL (ref 11.1–15.9)
Immature Grans (Abs): 0 10*3/uL (ref 0.0–0.1)
Immature Granulocytes: 0 %
Lymphocytes Absolute: 0.9 10*3/uL (ref 0.7–3.1)
Lymphs: 12 %
MCH: 27.3 pg (ref 26.6–33.0)
MCHC: 32.1 g/dL (ref 31.5–35.7)
MCV: 85 fL (ref 79–97)
Monocytes Absolute: 0.7 10*3/uL (ref 0.1–0.9)
Monocytes: 9 %
Neutrophils Absolute: 5.5 10*3/uL (ref 1.4–7.0)
Neutrophils: 73 %
Platelets: 214 10*3/uL (ref 150–450)
RBC: 4.83 x10E6/uL (ref 3.77–5.28)
RDW: 13.7 % (ref 11.7–15.4)
WBC: 7.6 10*3/uL (ref 3.4–10.8)

## 2021-11-25 LAB — BMP8+EGFR
BUN/Creatinine Ratio: 16 (ref 12–28)
BUN: 32 mg/dL — ABNORMAL HIGH (ref 8–27)
CO2: 27 mmol/L (ref 20–29)
Calcium: 9.2 mg/dL (ref 8.7–10.3)
Chloride: 104 mmol/L (ref 96–106)
Creatinine, Ser: 1.98 mg/dL — ABNORMAL HIGH (ref 0.57–1.00)
Glucose: 98 mg/dL (ref 70–99)
Potassium: 4.3 mmol/L (ref 3.5–5.2)
Sodium: 143 mmol/L (ref 134–144)
eGFR: 25 mL/min/{1.73_m2} — ABNORMAL LOW (ref >59)

## 2021-11-25 LAB — HEPATIC FUNCTION PANEL
ALT: 39 IU/L — ABNORMAL HIGH (ref 0–32)
AST: 42 IU/L — ABNORMAL HIGH (ref 0–40)
Albumin: 3.8 g/dL (ref 3.7–4.7)
Alkaline Phosphatase: 166 IU/L — ABNORMAL HIGH (ref 44–121)
Bilirubin Total: 1.9 mg/dL — ABNORMAL HIGH (ref 0.0–1.2)
Bilirubin, Direct: 1.27 mg/dL — ABNORMAL HIGH (ref 0.00–0.40)
Total Protein: 6.2 g/dL (ref 6.0–8.5)

## 2021-11-27 ENCOUNTER — Telehealth: Payer: Self-pay

## 2021-11-27 NOTE — Telephone Encounter (Signed)
Transition Care Management Follow-up Telephone Call Date of discharge and from where: 11/16/2021 How have you been since you were released from the hospital? Doing well Any questions or concerns? No  Items Reviewed: Did the pt receive and understand the discharge instructions provided? Yes  Medications obtained and verified? Yes  Other? No  Any new allergies since your discharge? No  Dietary orders reviewed? Yes Do you have support at home? Yes   Home Care and Equipment/Supplies: Were home health services ordered? yes If so, what is the name of the agency Has the agency set up a time to come to the patient's home? yes Were any new equipment or medical supplies ordered?   What is the name of the medical supply agency?  Were you able to get the supplies/equipment?  Do you have any questions related to the use of the equipment or supplies?   Functional Questionnaire: (I = Independent and D = Dependent) ADLs: I  Bathing/Dressing- I  Meal Prep- I  Eating- I  Maintaining continence- I  Transferring/Ambulation- I  Managing Meds- I  Follow up appointments reviewed:  PCP Hospital f/u appt confirmed? Yes  ALREADY HAD FOLLOW UP WITH Pcp Specialist Hospital f/u appt confirmed? Yes  Scheduled to see Dr. Inda Merlin on 12/07/2021 @ 130. Are transportation arrangements needed? No  If their condition worsens, is the pt aware to call PCP or go to the Emergency Dept.? Yes Was the patient provided with contact information for the PCP's office or ED? Yes Was to pt encouraged to call back with questions or concerns? Yes  Tomasa Rand, RN, BSN, CEN [facility] [employer] [phone]

## 2021-12-02 ENCOUNTER — Telehealth: Payer: Self-pay | Admitting: *Deleted

## 2021-12-02 DIAGNOSIS — I4811 Longstanding persistent atrial fibrillation: Secondary | ICD-10-CM

## 2021-12-02 NOTE — Telephone Encounter (Signed)
Fax received mdINR PT/INR self testing service Test date/time 12/01/21 1238 pm INR 1.7

## 2021-12-02 NOTE — Telephone Encounter (Signed)
Pt has been informed and will recheck in one week.

## 2021-12-02 NOTE — Telephone Encounter (Signed)
Description   INR  1.7 (too thick) Goal-2-3  Change weekly dose to take 5 mg (2 tablets) on Sundays and Wednesdays and 2.5 mg  (1 tablet) all of the other days.   Recheck in [place].       Caryl Pina, MD [facility] 12/02/2021, 1:02 PM

## 2021-12-03 ENCOUNTER — Telehealth: Payer: Self-pay | Admitting: Family

## 2021-12-03 NOTE — Telephone Encounter (Signed)
Pt saw you on 11/24/21, she has been vomiting a lot lately, has been taking the Promethazine Rx that was prescribed back 05/11/2019, has kept it on hand and only used it when she has needed it. Now that she is only been able to eat broth & crackers and unable to keep this down at times she needs a refill on the Promethazine for when she needs it, she only has about 2-3 pills left Please advise

## 2021-12-03 NOTE — Telephone Encounter (Signed)
  Prescription Request  12/03/2021  Is this a "Controlled Substance" medicine? no  Have you seen your PCP in the last 2 weeks? yes  If YES, route message to pool  -  If NO, patient needs to be scheduled for appointment.  What is the name of the medication or equipment? Tromethazine-25mg   Have you contacted your pharmacy to request a refill? no   Which pharmacy would you like this sent to? [facility]   Patient notified that their request is being sent to the clinical staff for review and that they should receive a response within 2 business days.    Tammie [name]' pt.  She has been sick & needs this refilled. She seen Tammie [name] a couple of weeks ago.

## 2021-12-07 ENCOUNTER — Inpatient Hospital Stay: Payer: HMO | Attending: Hematology and Oncology

## 2021-12-07 ENCOUNTER — Other Ambulatory Visit: Payer: Self-pay

## 2021-12-07 ENCOUNTER — Inpatient Hospital Stay (HOSPITAL_BASED_OUTPATIENT_CLINIC_OR_DEPARTMENT_OTHER): Payer: HMO | Admitting: Internal Medicine

## 2021-12-07 VITALS — BP 127/82 | HR 80 | Temp 98.2°F | Resp 20 | Ht 70.0 in | Wt 259.9 lb

## 2021-12-07 DIAGNOSIS — R21 Rash and other nonspecific skin eruption: Secondary | ICD-10-CM | POA: Insufficient documentation

## 2021-12-07 DIAGNOSIS — Z853 Personal history of malignant neoplasm of breast: Secondary | ICD-10-CM | POA: Insufficient documentation

## 2021-12-07 DIAGNOSIS — C189 Malignant neoplasm of colon, unspecified: Secondary | ICD-10-CM | POA: Diagnosis not present

## 2021-12-07 DIAGNOSIS — M7989 Other specified soft tissue disorders: Secondary | ICD-10-CM | POA: Insufficient documentation

## 2021-12-07 DIAGNOSIS — C50212 Malignant neoplasm of upper-inner quadrant of left female breast: Secondary | ICD-10-CM | POA: Diagnosis not present

## 2021-12-07 DIAGNOSIS — R110 Nausea: Secondary | ICD-10-CM | POA: Diagnosis not present

## 2021-12-07 DIAGNOSIS — Z170 Estrogen receptor positive status [ER+]: Secondary | ICD-10-CM | POA: Diagnosis not present

## 2021-12-07 DIAGNOSIS — C787 Secondary malignant neoplasm of liver and intrahepatic bile duct: Secondary | ICD-10-CM | POA: Diagnosis not present

## 2021-12-07 DIAGNOSIS — Z85118 Personal history of other malignant neoplasm of bronchus and lung: Secondary | ICD-10-CM | POA: Diagnosis not present

## 2021-12-07 DIAGNOSIS — I10 Essential (primary) hypertension: Secondary | ICD-10-CM | POA: Diagnosis not present

## 2021-12-07 LAB — CBC WITH DIFFERENTIAL (CANCER CENTER ONLY)
Abs Immature Granulocytes: 0.02 10*3/uL (ref 0.00–0.07)
Basophils Absolute: 0.1 10*3/uL (ref 0.0–0.1)
Basophils Relative: 1 %
Eosinophils Absolute: 0.3 10*3/uL (ref 0.0–0.5)
Eosinophils Relative: 4 %
HCT: 41 % (ref 36.0–46.0)
Hemoglobin: 12.9 g/dL (ref 12.0–15.0)
Immature Granulocytes: 0 %
Lymphocytes Relative: 13 %
Lymphs Abs: 1 10*3/uL (ref 0.7–4.0)
MCH: 27.2 pg (ref 26.0–34.0)
MCHC: 31.5 g/dL (ref 30.0–36.0)
MCV: 86.3 fL (ref 80.0–100.0)
Monocytes Absolute: 0.7 10*3/uL (ref 0.1–1.0)
Monocytes Relative: 10 %
Neutro Abs: 5.3 10*3/uL (ref 1.7–7.7)
Neutrophils Relative %: 72 %
Platelet Count: 254 10*3/uL (ref 150–400)
RBC: 4.75 MIL/uL (ref 3.87–5.11)
RDW: 13.9 % (ref 11.5–15.5)
WBC Count: 7.3 10*3/uL (ref 4.0–10.5)
nRBC: 0 % (ref 0.0–0.2)

## 2021-12-07 LAB — CMP (CANCER CENTER ONLY)
ALT: 22 U/L (ref 0–44)
AST: 20 U/L (ref 15–41)
Albumin: 3 g/dL — ABNORMAL LOW (ref 3.5–5.0)
Alkaline Phosphatase: 144 U/L — ABNORMAL HIGH (ref 38–126)
Anion gap: 12 (ref 5–15)
BUN: 27 mg/dL — ABNORMAL HIGH (ref 8–23)
CO2: 25 mmol/L (ref 22–32)
Calcium: 9.7 mg/dL (ref 8.9–10.3)
Chloride: 106 mmol/L (ref 98–111)
Creatinine: 1.92 mg/dL — ABNORMAL HIGH (ref 0.44–1.00)
GFR, Estimated: 26 mL/min — ABNORMAL LOW (ref >60)
Glucose, Bld: 93 mg/dL (ref 70–99)
Potassium: 4.7 mmol/L (ref 3.5–5.1)
Sodium: 143 mmol/L (ref 135–145)
Total Bilirubin: 1.5 mg/dL — ABNORMAL HIGH (ref 0.3–1.2)
Total Protein: 7.3 g/dL (ref 6.5–8.1)

## 2021-12-07 MED ORDER — PROMETHAZINE HCL 25 MG PO TABS: 25.0000 mg | ORAL_TABLET | Freq: Three times a day (TID) | ORAL | 0 refills | Status: DC | PRN

## 2021-12-07 MED ORDER — VALACYCLOVIR HCL 500 MG PO TABS: 500.0000 mg | ORAL_TABLET | Freq: Two times a day (BID) | ORAL | 0 refills | Status: DC

## 2021-12-07 MED ORDER — PROCHLORPERAZINE MALEATE 10 MG PO TABS: 10.0000 mg | ORAL_TABLET | Freq: Four times a day (QID) | ORAL | 0 refills | Status: DC | PRN

## 2021-12-07 NOTE — Telephone Encounter (Signed)
Promethazine Prescription sent to pharmacy

## 2021-12-07 NOTE — Progress Notes (Signed)
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 anatomy. IMPRESSION: 1. 6.2 x 5.2 x 5.7 cm heterogeneously enhancing lesion in the porta hepatis invading the medial liver and caudate lobe. This mass obstructs the left and right hepatic ducts near the confluence with intrahepatic biliary duct dilatation. Lesion generates substantial mass-effect on and may actually invade the main portal vein although the portal vein does appear to remain patent (assessment markedly limited due to motion artifact). MR imaging suggests that this is not a primary pancreatic neoplasm. Metastatic disease would be a distinct consideration. Peripheral cholangiocarcinoma and hepatocellular carcinoma are not excluded but felt to be less likely based on enhancement characteristics. 2. Cholelithiasis. 3. Bilateral renal cysts. Electronically Signed   By: EMisty StanleyM.D.   On: 11/13/2021 05:42   [place] Limited RUQ (LIVER/GB)  Result Date: 11/12/2021 CLINICAL DATA:  History of jaundice, known metastatic colon carcinoma EXAM: ULTRASOUND ABDOMEN LIMITED RIGHT UPPER QUADRANT  COMPARISON:  CT from 08/04/2021 FINDINGS: Gallbladder: Cholelithiasis is identified without wall thickening or pericholecystic fluid. The overall appearance is similar seen on prior CT examination. Common bile duct: Diameter: 11.3 mm. Liver: Increased in echogenicity without focal mass. Portal vein is patent on color Doppler imaging with normal direction of blood flow towards the liver. Biliary ductal dilatation is noted. Other: None. IMPRESSION:  Cholelithiasis without complicating factors. Prominent common bile duct and intrahepatic dilatation without definitive stone. This is suggestive of distal common bile duct stone and will be better evaluated on upcoming CT examination. Fatty liver Electronically Signed   By: Inez Catalina M.D.   On: 11/12/2021 18:26     ASSESSMENT AND PLAN:  This is a pleasant 80 years old white female with multiple malignancies including history of breast cancer, history of lung cancer status post resection and most recently treated with metastatic colon adenocarcinoma with liver metastasis and MSI high. The patient completed treatment with Keytruda 200 mg IV every 3 weeks status post 35 cycles.   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  She underwent left lumpectomy on 09/11/2021 and the final pathology showed 1.8 cm invasive ductal carcinoma.  She is recovering well from her surgery. The patient is currently undergoing treatment with letrozole 2.5 mg p.o. daily.  She has been tolerating this treatment well with no concerning adverse effects. I recommended for the patient to continue her current treatment with letrozole with the same dose. The patient was seen by Dr. Lisbeth Renshaw in the past and she is not a candidate for adjuvant radiotherapy. She recently developed porta hepatis lymphadenopathy suspicious for progression of her colorectal cancer even the cytology from the recent  biopsy was negative for malignancy but the imaging characteristic of the lesion is highly suspicious for progressive metastatic disease. I recommended for the patient to see Dr. Lisbeth Renshaw for consideration of palliative radiotherapy to this area. I will see the patient back for follow-up visit in 1 months for evaluation and repeat blood work before considering resuming her treatment with immunotherapy with Keytruda again.  She did have great response to this treatment in the past. For the dermatome like skin rash under the right breast this is still concerning for herpes zoster remanence, I will start the patient on Valtrex 500 mg p.o. twice daily. For the nausea I will give her a refill of Compazine 10 mg p.o. every 6 hours as needed. For the swelling of the lower extremities, she will continue on Lasix 20 mg p.o. daily but I also advised the patient to discuss with her primary care physician increasing the dose. For the hypertension she will take her blood pressure medications as prescribed and monitor it closely at home. The patient was advised to call immediately if she has any other concerning symptoms in the interval. The patient voices understanding of current disease status and treatment options and is in agreement with the current care plan. All questions were answered. The patient knows to call the clinic with any problems, questions or concerns. We can certainly see the patient much sooner if necessary.  Disclaimer: This note was dictated with voice recognition software. Similar sounding words can inadvertently be transcribed and may not be corrected upon review.

## 2021-12-07 NOTE — Telephone Encounter (Signed)
LEFT DETAILED MESSAGE.  

## 2021-12-07 NOTE — Addendum Note (Signed)
Addended by: [name] A on: 12/07/2021 02:14 PM   Modules accepted: Orders

## 2021-12-08 LAB — CANCER ANTIGEN 27.29: CA 27.29: 15.5 U/mL (ref 0.0–38.6)

## 2021-12-14 ENCOUNTER — Other Ambulatory Visit: Payer: Self-pay | Admitting: Family

## 2021-12-14 DIAGNOSIS — E785 Hyperlipidemia, unspecified: Secondary | ICD-10-CM

## 2021-12-15 ENCOUNTER — Other Ambulatory Visit: Payer: Self-pay | Admitting: Family

## 2021-12-16 ENCOUNTER — Telehealth: Payer: Self-pay | Admitting: *Deleted

## 2021-12-16 DIAGNOSIS — I4811 Longstanding persistent atrial fibrillation: Secondary | ICD-10-CM

## 2021-12-16 NOTE — Telephone Encounter (Signed)
Hold coumadin until further notice. E.D. necessary if she starts to have bleeding.

## 2021-12-16 NOTE — Progress Notes (Signed)
GI Location of Tumor / Histology: Colorectal Cancer- Porta Hepatis Lymphadenopathy  ° °Tammie [name] presented ° °MRI Abdomen 11/12/2021:  6.2 x 5.2 x 5.7 cm heterogeneously enhancing lesion in the porta hepatis invading the medial liver and caudate lobe. This mass obstructs the left and right hepatic ducts near the confluence with intrahepatic biliary duct dilatation ° °CT Abdomen 11/12/2021: . Ill-defined soft tissue fullness in the right periportal region superior to the pancreatic head. There is mild to moderate intrahepatic biliary ductal dilatation. Findings are worrisome for °primary pancreatic neoplasm or cholangiocarcinoma. Further °evaluation with MRI recommended. °  ° °Biopsies of  ° °Past/Anticipated interventions by surgeon, if any:  ° °Past/Anticipated interventions by medical oncology, if any:  °Tammie [name] 12/07/2021 °-multiple malignancies including history of breast cancer, history of lung cancer status post resection and most recently treated with metastatic colon adenocarcinoma with liver metastasis and MSI high. °-The patient completed treatment with Keytruda 200 mg IV every 3 weeks status post 35 cycles.   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  She underwent left lumpectomy on 09/11/2021 and the final pathology showed 1.8 cm invasive ductal carcinoma.  She is recovering well from her surgery. °-She recently developed porta hepatis lymphadenopathy suspicious for progression of her colorectal cancer even the cytology from the recent biopsy was negative for malignancy but the imaging characteristic of the lesion is highly suspicious for progressive metastatic disease. °I recommended for the patient to see Dr. Moody for consideration of palliative radiotherapy to this area. °-I will see the patient back for follow-up visit in 1 months for evaluation and repeat blood work before considering  resuming her treatment with immunotherapy with Keytruda again.  She did have great response to this treatment in the past. °-Per patient Keytruda start date 01/07/2021 ° ° °Weight changes, if any: Has went from 284 to 255 over about 2-3 months. ° °Bowel/Bladder complaints, if any: Having constipation, taking stool softeners.  ° °Nausea / Vomiting, if any: Has occasional nausea/vomiting. ° °Pain issues, if any:  Pain under ribs on left side. ° °Diet: tolerance fluctuates from solids to liquids.  Eating smaller amounts at a time. ° °Any blood per rectum: No   ° ° °SAFETY ISSUES: °Prior radiation? Right Breast 50 gray in 25 fractions completed 02/25/2014 with Dr. Wentworth. °Pacemaker/ICD? No °Possible current pregnancy? Hysterectomy °Is the patient on methotrexate? No ° °Current Complaints/Details: °History: Right Breast Cancer, Lung Cancer °

## 2021-12-16 NOTE — Telephone Encounter (Signed)
Fax received mdINR PT/INR self testing service Test date/time 12/15/21 1019pm INR 8.0

## 2021-12-16 NOTE — Telephone Encounter (Signed)
Pt aware by phone 

## 2021-12-16 NOTE — Telephone Encounter (Signed)
Pt states she did not go to the ED last night, she has only one person that can drive her and they work on Wednesday's, so she has no way of getting to the office today. Tomorrow she starts radiation and can not come to an appointment. Please advise, informed her that she is critically out of range, she is aware of this, she did check it twice, she is not bleeding.

## 2021-12-17 ENCOUNTER — Ambulatory Visit
Admission: RE | Admit: 2021-12-17 | Discharge: 2021-12-17 | Disposition: A | Discharge status: Home / Self Care | Payer: HMO | Source: Ambulatory Visit | Attending: Radiation Oncology | Admitting: Radiation Oncology

## 2021-12-17 ENCOUNTER — Encounter: Payer: Self-pay | Admitting: Radiation Oncology

## 2021-12-17 ENCOUNTER — Other Ambulatory Visit: Payer: Self-pay

## 2021-12-17 VITALS — BP 117/59 | HR 90 | Temp 97.9°F | Resp 20 | Ht 70.5 in | Wt 255.4 lb

## 2021-12-17 DIAGNOSIS — C3431 Malignant neoplasm of lower lobe, right bronchus or lung: Secondary | ICD-10-CM

## 2021-12-17 DIAGNOSIS — E669 Obesity, unspecified: Secondary | ICD-10-CM | POA: Insufficient documentation

## 2021-12-17 DIAGNOSIS — C50412 Malignant neoplasm of upper-outer quadrant of left female breast: Secondary | ICD-10-CM | POA: Diagnosis not present

## 2021-12-17 DIAGNOSIS — E785 Hyperlipidemia, unspecified: Secondary | ICD-10-CM | POA: Insufficient documentation

## 2021-12-17 DIAGNOSIS — K5900 Constipation, unspecified: Secondary | ICD-10-CM | POA: Diagnosis not present

## 2021-12-17 DIAGNOSIS — C189 Malignant neoplasm of colon, unspecified: Secondary | ICD-10-CM | POA: Insufficient documentation

## 2021-12-17 DIAGNOSIS — I4891 Unspecified atrial fibrillation: Secondary | ICD-10-CM | POA: Insufficient documentation

## 2021-12-17 DIAGNOSIS — I10 Essential (primary) hypertension: Secondary | ICD-10-CM | POA: Insufficient documentation

## 2021-12-17 DIAGNOSIS — Z8673 Personal history of transient ischemic attack (TIA), and cerebral infarction without residual deficits: Secondary | ICD-10-CM | POA: Insufficient documentation

## 2021-12-17 DIAGNOSIS — Z87442 Personal history of urinary calculi: Secondary | ICD-10-CM | POA: Diagnosis not present

## 2021-12-17 DIAGNOSIS — J45909 Unspecified asthma, uncomplicated: Secondary | ICD-10-CM | POA: Diagnosis not present

## 2021-12-17 DIAGNOSIS — K831 Obstruction of bile duct: Secondary | ICD-10-CM

## 2021-12-17 DIAGNOSIS — K219 Gastro-esophageal reflux disease without esophagitis: Secondary | ICD-10-CM | POA: Insufficient documentation

## 2021-12-17 DIAGNOSIS — Z801 Family history of malignant neoplasm of trachea, bronchus and lung: Secondary | ICD-10-CM | POA: Insufficient documentation

## 2021-12-17 DIAGNOSIS — Z87891 Personal history of nicotine dependence: Secondary | ICD-10-CM | POA: Insufficient documentation

## 2021-12-17 DIAGNOSIS — J449 Chronic obstructive pulmonary disease, unspecified: Secondary | ICD-10-CM | POA: Diagnosis not present

## 2021-12-17 DIAGNOSIS — Z9221 Personal history of antineoplastic chemotherapy: Secondary | ICD-10-CM | POA: Insufficient documentation

## 2021-12-17 DIAGNOSIS — B029 Zoster without complications: Secondary | ICD-10-CM | POA: Insufficient documentation

## 2021-12-17 DIAGNOSIS — Z170 Estrogen receptor positive status [ER+]: Secondary | ICD-10-CM | POA: Diagnosis not present

## 2021-12-17 DIAGNOSIS — Z7901 Long term (current) use of anticoagulants: Secondary | ICD-10-CM | POA: Insufficient documentation

## 2021-12-17 DIAGNOSIS — C787 Secondary malignant neoplasm of liver and intrahepatic bile duct: Secondary | ICD-10-CM | POA: Insufficient documentation

## 2021-12-17 DIAGNOSIS — R634 Abnormal weight loss: Secondary | ICD-10-CM | POA: Insufficient documentation

## 2021-12-17 DIAGNOSIS — Z79899 Other long term (current) drug therapy: Secondary | ICD-10-CM | POA: Insufficient documentation

## 2021-12-17 DIAGNOSIS — Z85118 Personal history of other malignant neoplasm of bronchus and lung: Secondary | ICD-10-CM | POA: Insufficient documentation

## 2021-12-17 DIAGNOSIS — Z79811 Long term (current) use of aromatase inhibitors: Secondary | ICD-10-CM | POA: Diagnosis not present
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
## 2021-12-19 ENCOUNTER — Other Ambulatory Visit: Payer: Self-pay | Admitting: Family

## 2021-12-19 ENCOUNTER — Other Ambulatory Visit: Payer: Self-pay | Admitting: Internal Medicine

## 2021-12-19 DIAGNOSIS — I5033 Acute on chronic diastolic (congestive) heart failure: Secondary | ICD-10-CM

## 2021-12-19 DIAGNOSIS — E785 Hyperlipidemia, unspecified: Secondary | ICD-10-CM

## 2021-12-19 DIAGNOSIS — I10 Essential (primary) hypertension: Secondary | ICD-10-CM

## 2021-12-20 ENCOUNTER — Encounter: Payer: Self-pay | Admitting: Internal Medicine

## 2021-12-20 ENCOUNTER — Other Ambulatory Visit: Payer: Self-pay | Admitting: Internal Medicine

## 2021-12-20 MED ORDER — PROMETHAZINE HCL 25 MG PO TABS: 25.0000 mg | ORAL_TABLET | Freq: Three times a day (TID) | ORAL | 0 refills | Status: DC | PRN

## 2021-12-22 ENCOUNTER — Telehealth: Payer: Self-pay | Admitting: *Deleted

## 2021-12-22 DIAGNOSIS — I4811 Longstanding persistent atrial fibrillation: Secondary | ICD-10-CM

## 2021-12-22 NOTE — Telephone Encounter (Signed)
Description   INR  1.6 (too thick) Goal-2-3  Start 2.5 mg  (1 tablet) daily.   Recheck in 1 weeks.

## 2021-12-22 NOTE — Telephone Encounter (Signed)
Fax received mdINR PT/INR self testing service Test date/time 12/22/21 1058 am INR 1.6  Pt's INR on 12/16/21 was 8.0 and was instructed by Dr. Livia Snellen to hold Coumadin till further notice & to go to ED if she had any bleeding.

## 2021-12-22 NOTE — Telephone Encounter (Signed)
Pt aware of results and recommendations and voiced understanding. 

## 2021-12-26 ENCOUNTER — Encounter (HOSPITAL_COMMUNITY): Payer: Self-pay

## 2021-12-26 ENCOUNTER — Emergency Department (HOSPITAL_COMMUNITY): Payer: HMO

## 2021-12-26 ENCOUNTER — Other Ambulatory Visit: Payer: Self-pay

## 2021-12-26 ENCOUNTER — Inpatient Hospital Stay (HOSPITAL_COMMUNITY)
Admission: EM | Admit: 2021-12-26 | Discharge: 2022-01-05 | DRG: 871 | Disposition: A | Discharge status: Home / Self Care | Payer: HMO | Attending: Family Medicine | Admitting: Family Medicine

## 2021-12-26 DIAGNOSIS — G893 Neoplasm related pain (acute) (chronic): Secondary | ICD-10-CM | POA: Diagnosis present

## 2021-12-26 DIAGNOSIS — G928 Other toxic encephalopathy: Secondary | ICD-10-CM | POA: Diagnosis not present

## 2021-12-26 DIAGNOSIS — J9602 Acute respiratory failure with hypercapnia: Secondary | ICD-10-CM | POA: Diagnosis present

## 2021-12-26 DIAGNOSIS — A419 Sepsis, unspecified organism: Secondary | ICD-10-CM | POA: Diagnosis not present

## 2021-12-26 DIAGNOSIS — N1832 Chronic kidney disease, stage 3b: Secondary | ICD-10-CM | POA: Diagnosis present

## 2021-12-26 DIAGNOSIS — Z853 Personal history of malignant neoplasm of breast: Secondary | ICD-10-CM

## 2021-12-26 DIAGNOSIS — Z20822 Contact with and (suspected) exposure to covid-19: Secondary | ICD-10-CM | POA: Diagnosis present

## 2021-12-26 DIAGNOSIS — R404 Transient alteration of awareness: Secondary | ICD-10-CM | POA: Diagnosis not present

## 2021-12-26 DIAGNOSIS — C787 Secondary malignant neoplasm of liver and intrahepatic bile duct: Secondary | ICD-10-CM | POA: Diagnosis present

## 2021-12-26 DIAGNOSIS — E860 Dehydration: Secondary | ICD-10-CM | POA: Diagnosis present

## 2021-12-26 DIAGNOSIS — R627 Adult failure to thrive: Secondary | ICD-10-CM | POA: Diagnosis present

## 2021-12-26 DIAGNOSIS — I5032 Chronic diastolic (congestive) heart failure: Secondary | ICD-10-CM | POA: Diagnosis present

## 2021-12-26 DIAGNOSIS — F419 Anxiety disorder, unspecified: Secondary | ICD-10-CM | POA: Diagnosis present

## 2021-12-26 DIAGNOSIS — Z7189 Other specified counseling: Secondary | ICD-10-CM

## 2021-12-26 DIAGNOSIS — Z85038 Personal history of other malignant neoplasm of large intestine: Secondary | ICD-10-CM

## 2021-12-26 DIAGNOSIS — J189 Pneumonia, unspecified organism: Secondary | ICD-10-CM

## 2021-12-26 DIAGNOSIS — D649 Anemia, unspecified: Secondary | ICD-10-CM | POA: Diagnosis not present

## 2021-12-26 DIAGNOSIS — Z87891 Personal history of nicotine dependence: Secondary | ICD-10-CM

## 2021-12-26 DIAGNOSIS — J449 Chronic obstructive pulmonary disease, unspecified: Secondary | ICD-10-CM | POA: Diagnosis present

## 2021-12-26 DIAGNOSIS — Z90710 Acquired absence of both cervix and uterus: Secondary | ICD-10-CM

## 2021-12-26 DIAGNOSIS — Z87442 Personal history of urinary calculi: Secondary | ICD-10-CM

## 2021-12-26 DIAGNOSIS — Z7901 Long term (current) use of anticoagulants: Secondary | ICD-10-CM

## 2021-12-26 DIAGNOSIS — Z85118 Personal history of other malignant neoplasm of bronchus and lung: Secondary | ICD-10-CM

## 2021-12-26 DIAGNOSIS — K750 Abscess of liver: Secondary | ICD-10-CM | POA: Diagnosis present

## 2021-12-26 DIAGNOSIS — E785 Hyperlipidemia, unspecified: Secondary | ICD-10-CM | POA: Diagnosis present

## 2021-12-26 DIAGNOSIS — K219 Gastro-esophageal reflux disease without esophagitis: Secondary | ICD-10-CM | POA: Diagnosis present

## 2021-12-26 DIAGNOSIS — Z79899 Other long term (current) drug therapy: Secondary | ICD-10-CM

## 2021-12-26 DIAGNOSIS — Z8673 Personal history of transient ischemic attack (TIA), and cerebral infarction without residual deficits: Secondary | ICD-10-CM

## 2021-12-26 DIAGNOSIS — K769 Liver disease, unspecified: Secondary | ICD-10-CM

## 2021-12-26 DIAGNOSIS — Z91048 Other nonmedicinal substance allergy status: Secondary | ICD-10-CM

## 2021-12-26 DIAGNOSIS — K858 Other acute pancreatitis without necrosis or infection: Secondary | ICD-10-CM | POA: Diagnosis present

## 2021-12-26 DIAGNOSIS — Z5112 Encounter for antineoplastic immunotherapy: Secondary | ICD-10-CM

## 2021-12-26 DIAGNOSIS — Z79811 Long term (current) use of aromatase inhibitors: Secondary | ICD-10-CM

## 2021-12-26 DIAGNOSIS — Z888 Allergy status to other drugs, medicaments and biological substances status: Secondary | ICD-10-CM

## 2021-12-26 DIAGNOSIS — R0602 Shortness of breath: Secondary | ICD-10-CM

## 2021-12-26 DIAGNOSIS — E669 Obesity, unspecified: Secondary | ICD-10-CM | POA: Diagnosis present

## 2021-12-26 DIAGNOSIS — R160 Hepatomegaly, not elsewhere classified: Secondary | ICD-10-CM | POA: Diagnosis present

## 2021-12-26 DIAGNOSIS — Z66 Do not resuscitate: Secondary | ICD-10-CM | POA: Diagnosis not present

## 2021-12-26 DIAGNOSIS — I482 Chronic atrial fibrillation, unspecified: Secondary | ICD-10-CM | POA: Diagnosis present

## 2021-12-26 DIAGNOSIS — Z882 Allergy status to sulfonamides status: Secondary | ICD-10-CM

## 2021-12-26 DIAGNOSIS — Z515 Encounter for palliative care: Secondary | ICD-10-CM

## 2021-12-26 DIAGNOSIS — J302 Other seasonal allergic rhinitis: Secondary | ICD-10-CM | POA: Diagnosis present

## 2021-12-26 DIAGNOSIS — R932 Abnormal findings on diagnostic imaging of liver and biliary tract: Secondary | ICD-10-CM

## 2021-12-26 DIAGNOSIS — Z9981 Dependence on supplemental oxygen: Secondary | ICD-10-CM

## 2021-12-26 DIAGNOSIS — N179 Acute kidney failure, unspecified: Secondary | ICD-10-CM | POA: Diagnosis not present

## 2021-12-26 DIAGNOSIS — Z91041 Radiographic dye allergy status: Secondary | ICD-10-CM

## 2021-12-26 DIAGNOSIS — I959 Hypotension, unspecified: Secondary | ICD-10-CM | POA: Diagnosis not present

## 2021-12-26 DIAGNOSIS — Z6839 Body mass index (BMI) 39.0-39.9, adult: Secondary | ICD-10-CM

## 2021-12-26 DIAGNOSIS — J9611 Chronic respiratory failure with hypoxia: Secondary | ICD-10-CM | POA: Diagnosis present

## 2021-12-26 DIAGNOSIS — E8809 Other disorders of plasma-protein metabolism, not elsewhere classified: Secondary | ICD-10-CM | POA: Diagnosis not present

## 2021-12-26 DIAGNOSIS — C189 Malignant neoplasm of colon, unspecified: Secondary | ICD-10-CM | POA: Diagnosis present

## 2021-12-26 DIAGNOSIS — N200 Calculus of kidney: Secondary | ICD-10-CM | POA: Diagnosis present

## 2021-12-26 DIAGNOSIS — T391X5A Adverse effect of 4-Aminophenol derivatives, initial encounter: Secondary | ICD-10-CM | POA: Diagnosis not present

## 2021-12-26 DIAGNOSIS — Z8249 Family history of ischemic heart disease and other diseases of the circulatory system: Secondary | ICD-10-CM

## 2021-12-26 DIAGNOSIS — I130 Hypertensive heart and chronic kidney disease with heart failure and stage 1 through stage 4 chronic kidney disease, or unspecified chronic kidney disease: Secondary | ICD-10-CM | POA: Diagnosis present

## 2021-12-26 DIAGNOSIS — D689 Coagulation defect, unspecified: Secondary | ICD-10-CM | POA: Diagnosis not present

## 2021-12-26 DIAGNOSIS — R109 Unspecified abdominal pain: Secondary | ICD-10-CM | POA: Diagnosis present

## 2021-12-26 LAB — CBC WITH DIFFERENTIAL/PLATELET
Abs Immature Granulocytes: 0.14 10*3/uL — ABNORMAL HIGH (ref 0.00–0.07)
Basophils Absolute: 0 10*3/uL (ref 0.0–0.1)
Basophils Relative: 0 %
Eosinophils Absolute: 0 10*3/uL (ref 0.0–0.5)
Eosinophils Relative: 0 %
HCT: 38.7 % (ref 36.0–46.0)
Hemoglobin: 12.1 g/dL (ref 12.0–15.0)
Immature Granulocytes: 1 %
Lymphocytes Relative: 3 %
Lymphs Abs: 0.4 10*3/uL — ABNORMAL LOW (ref 0.7–4.0)
MCH: 26.4 pg (ref 26.0–34.0)
MCHC: 31.3 g/dL (ref 30.0–36.0)
MCV: 84.5 fL (ref 80.0–100.0)
Monocytes Absolute: 0.8 10*3/uL (ref 0.1–1.0)
Monocytes Relative: 5 %
Neutro Abs: 14.7 10*3/uL — ABNORMAL HIGH (ref 1.7–7.7)
Neutrophils Relative %: 91 %
Platelets: 395 10*3/uL (ref 150–400)
RBC: 4.58 MIL/uL (ref 3.87–5.11)
RDW: 14.6 % (ref 11.5–15.5)
WBC: 16.1 10*3/uL — ABNORMAL HIGH (ref 4.0–10.5)
nRBC: 0 % (ref 0.0–0.2)

## 2021-12-26 LAB — COMPREHENSIVE METABOLIC PANEL
ALT: 37 U/L (ref 0–44)
AST: 83 U/L — ABNORMAL HIGH (ref 15–41)
Albumin: 2.4 g/dL — ABNORMAL LOW (ref 3.5–5.0)
Alkaline Phosphatase: 304 U/L — ABNORMAL HIGH (ref 38–126)
Anion gap: 12 (ref 5–15)
BUN: 45 mg/dL — ABNORMAL HIGH (ref 8–23)
CO2: 21 mmol/L — ABNORMAL LOW (ref 22–32)
Calcium: 8.7 mg/dL — ABNORMAL LOW (ref 8.9–10.3)
Chloride: 102 mmol/L (ref 98–111)
Creatinine, Ser: 2.17 mg/dL — ABNORMAL HIGH (ref 0.44–1.00)
GFR, Estimated: 22 mL/min — ABNORMAL LOW (ref >60)
Glucose, Bld: 107 mg/dL — ABNORMAL HIGH (ref 70–99)
Potassium: 3.9 mmol/L (ref 3.5–5.1)
Sodium: 135 mmol/L (ref 135–145)
Total Bilirubin: 3.7 mg/dL — ABNORMAL HIGH (ref 0.3–1.2)
Total Protein: 7.5 g/dL (ref 6.5–8.1)

## 2021-12-26 LAB — LACTIC ACID, PLASMA: Lactic Acid, Venous: 1.5 mmol/L (ref 0.5–1.9)

## 2021-12-26 LAB — LIPASE, BLOOD: Lipase: 75 U/L — ABNORMAL HIGH (ref 11–51)

## 2021-12-26 NOTE — ED Provider Notes (Signed)
Emergency Medicine Provider Triage Evaluation Note  Tammie [name] , a 80 y.o. female  was evaluated in triage.  Pt complains of back pain and generalized abdominal pain for the last 3 to 4 days.  Patient with multiple malignancies due to start chemotherapy the first of the year.  Unable to provide any further insight into her presentation today.  Review of Systems  Positive: Back pain, abdominal, anorexia Negative: Nausea, vomiting, diarrhea  Physical Exam  BP 106/64 (BP Location: Right Arm)    Pulse (!) 114    Temp 97.9 F (36.6 C) (Oral)    Resp 20    Ht 5\' 10"  (1.778 m)    Wt 117.9 kg    SpO2 93%    BMI 37.31 kg/m  Gen:   Awake, no distress   Resp:  Normal effort tachypneic MSK:   Moves extremities without difficulty  Other:  Tachycardic with regular rhythm.  Lungs CTA B.  Tenderness palpation of the lumbar spine and generalized abdominal tenderness to palpation.  Medical Decision Making  Medically screening exam initiated at 9:22 PM.  Appropriate orders placed.  [name] was informed that the remainder of the evaluation will be completed by another provider, this initial triage assessment does not replace that evaluation, and the importance of remaining in the ED until their evaluation is complete.  This chart was dictated using voice recognition software, Dragon. Despite the best efforts of this provider to proofread and correct errors, errors may still occur which can change documentation meaning.    Aura Dials 12/26/21 2122    Daleen Bo, MD 12/26/21 2233

## 2021-12-26 NOTE — ED Triage Notes (Signed)
Pt arrives POV with c/o lower back pain. Pt says she's unsure where pain is exactly. Called PCP for some pain medication and was directed to ER for evaluation. Hx breast/ lung/ colon cancer.

## 2021-12-27 ENCOUNTER — Other Ambulatory Visit: Payer: Self-pay

## 2021-12-27 ENCOUNTER — Encounter (HOSPITAL_COMMUNITY): Payer: Self-pay | Admitting: Internal Medicine

## 2021-12-27 DIAGNOSIS — R1012 Left upper quadrant pain: Secondary | ICD-10-CM | POA: Diagnosis not present

## 2021-12-27 DIAGNOSIS — R7989 Other specified abnormal findings of blood chemistry: Secondary | ICD-10-CM | POA: Diagnosis not present

## 2021-12-27 DIAGNOSIS — C189 Malignant neoplasm of colon, unspecified: Secondary | ICD-10-CM | POA: Diagnosis present

## 2021-12-27 DIAGNOSIS — C787 Secondary malignant neoplasm of liver and intrahepatic bile duct: Secondary | ICD-10-CM | POA: Diagnosis present

## 2021-12-27 DIAGNOSIS — G893 Neoplasm related pain (acute) (chronic): Secondary | ICD-10-CM | POA: Diagnosis not present

## 2021-12-27 DIAGNOSIS — E860 Dehydration: Secondary | ICD-10-CM | POA: Diagnosis present

## 2021-12-27 DIAGNOSIS — N1832 Chronic kidney disease, stage 3b: Secondary | ICD-10-CM | POA: Diagnosis present

## 2021-12-27 DIAGNOSIS — K750 Abscess of liver: Secondary | ICD-10-CM | POA: Diagnosis present

## 2021-12-27 DIAGNOSIS — Z7189 Other specified counseling: Secondary | ICD-10-CM | POA: Diagnosis not present

## 2021-12-27 DIAGNOSIS — G928 Other toxic encephalopathy: Secondary | ICD-10-CM | POA: Diagnosis not present

## 2021-12-27 DIAGNOSIS — J9602 Acute respiratory failure with hypercapnia: Secondary | ICD-10-CM | POA: Diagnosis present

## 2021-12-27 DIAGNOSIS — Z6839 Body mass index (BMI) 39.0-39.9, adult: Secondary | ICD-10-CM | POA: Diagnosis not present

## 2021-12-27 DIAGNOSIS — K858 Other acute pancreatitis without necrosis or infection: Secondary | ICD-10-CM

## 2021-12-27 DIAGNOSIS — R1011 Right upper quadrant pain: Secondary | ICD-10-CM | POA: Diagnosis not present

## 2021-12-27 DIAGNOSIS — R1010 Upper abdominal pain, unspecified: Secondary | ICD-10-CM

## 2021-12-27 DIAGNOSIS — A419 Sepsis, unspecified organism: Secondary | ICD-10-CM | POA: Diagnosis present

## 2021-12-27 DIAGNOSIS — R1013 Epigastric pain: Secondary | ICD-10-CM | POA: Diagnosis not present

## 2021-12-27 DIAGNOSIS — R160 Hepatomegaly, not elsewhere classified: Secondary | ICD-10-CM | POA: Diagnosis present

## 2021-12-27 DIAGNOSIS — E669 Obesity, unspecified: Secondary | ICD-10-CM | POA: Diagnosis not present

## 2021-12-27 DIAGNOSIS — Z515 Encounter for palliative care: Secondary | ICD-10-CM | POA: Diagnosis not present

## 2021-12-27 DIAGNOSIS — R109 Unspecified abdominal pain: Secondary | ICD-10-CM | POA: Diagnosis present

## 2021-12-27 DIAGNOSIS — Z20822 Contact with and (suspected) exposure to covid-19: Secondary | ICD-10-CM | POA: Diagnosis present

## 2021-12-27 DIAGNOSIS — E8809 Other disorders of plasma-protein metabolism, not elsewhere classified: Secondary | ICD-10-CM | POA: Diagnosis not present

## 2021-12-27 DIAGNOSIS — I130 Hypertensive heart and chronic kidney disease with heart failure and stage 1 through stage 4 chronic kidney disease, or unspecified chronic kidney disease: Secondary | ICD-10-CM | POA: Diagnosis present

## 2021-12-27 DIAGNOSIS — J449 Chronic obstructive pulmonary disease, unspecified: Secondary | ICD-10-CM | POA: Diagnosis present

## 2021-12-27 DIAGNOSIS — I5032 Chronic diastolic (congestive) heart failure: Secondary | ICD-10-CM | POA: Diagnosis present

## 2021-12-27 DIAGNOSIS — D649 Anemia, unspecified: Secondary | ICD-10-CM | POA: Diagnosis not present

## 2021-12-27 DIAGNOSIS — R932 Abnormal findings on diagnostic imaging of liver and biliary tract: Secondary | ICD-10-CM | POA: Diagnosis not present

## 2021-12-27 DIAGNOSIS — Z7901 Long term (current) use of anticoagulants: Secondary | ICD-10-CM | POA: Diagnosis not present

## 2021-12-27 DIAGNOSIS — J9611 Chronic respiratory failure with hypoxia: Secondary | ICD-10-CM | POA: Diagnosis present

## 2021-12-27 DIAGNOSIS — Z66 Do not resuscitate: Secondary | ICD-10-CM | POA: Diagnosis not present

## 2021-12-27 DIAGNOSIS — N179 Acute kidney failure, unspecified: Secondary | ICD-10-CM | POA: Diagnosis not present

## 2021-12-27 DIAGNOSIS — K831 Obstruction of bile duct: Secondary | ICD-10-CM | POA: Diagnosis not present

## 2021-12-27 DIAGNOSIS — D689 Coagulation defect, unspecified: Secondary | ICD-10-CM | POA: Diagnosis not present

## 2021-12-27 DIAGNOSIS — I959 Hypotension, unspecified: Secondary | ICD-10-CM | POA: Diagnosis not present

## 2021-12-27 DIAGNOSIS — I482 Chronic atrial fibrillation, unspecified: Secondary | ICD-10-CM | POA: Diagnosis present

## 2021-12-27 LAB — CBC
HCT: 34.6 % — ABNORMAL LOW (ref 36.0–46.0)
HCT: 32 % — ABNORMAL LOW (ref 36.0–46.0)
Hemoglobin: 10.9 g/dL — ABNORMAL LOW (ref 12.0–15.0)
Hemoglobin: 10 g/dL — ABNORMAL LOW (ref 12.0–15.0)
MCH: 26.9 pg (ref 26.0–34.0)
MCH: 27.2 pg (ref 26.0–34.0)
MCHC: 31.5 g/dL (ref 30.0–36.0)
MCHC: 31.3 g/dL (ref 30.0–36.0)
MCV: 85.4 fL (ref 80.0–100.0)
MCV: 87 fL (ref 80.0–100.0)
Platelets: 378 10*3/uL (ref 150–400)
Platelets: 347 10*3/uL (ref 150–400)
RBC: 4.05 MIL/uL (ref 3.87–5.11)
RBC: 3.68 MIL/uL — ABNORMAL LOW (ref 3.87–5.11)
RDW: 14.7 % (ref 11.5–15.5)
RDW: 14.7 % (ref 11.5–15.5)
WBC: 17.7 10*3/uL — ABNORMAL HIGH (ref 4.0–10.5)
WBC: 17 10*3/uL — ABNORMAL HIGH (ref 4.0–10.5)
nRBC: 0 % (ref 0.0–0.2)
nRBC: 0 % (ref 0.0–0.2)

## 2021-12-27 LAB — COMPREHENSIVE METABOLIC PANEL
ALT: 34 U/L (ref 0–44)
AST: 68 U/L — ABNORMAL HIGH (ref 15–41)
Albumin: 2 g/dL — ABNORMAL LOW (ref 3.5–5.0)
Alkaline Phosphatase: 246 U/L — ABNORMAL HIGH (ref 38–126)
Anion gap: 9 (ref 5–15)
BUN: 53 mg/dL — ABNORMAL HIGH (ref 8–23)
CO2: 23 mmol/L (ref 22–32)
Calcium: 8.3 mg/dL — ABNORMAL LOW (ref 8.9–10.3)
Chloride: 102 mmol/L (ref 98–111)
Creatinine, Ser: 2.36 mg/dL — ABNORMAL HIGH (ref 0.44–1.00)
GFR, Estimated: 20 mL/min — ABNORMAL LOW (ref >60)
Glucose, Bld: 120 mg/dL — ABNORMAL HIGH (ref 70–99)
Potassium: 4.1 mmol/L (ref 3.5–5.1)
Sodium: 134 mmol/L — ABNORMAL LOW (ref 135–145)
Total Bilirubin: 3.3 mg/dL — ABNORMAL HIGH (ref 0.3–1.2)
Total Protein: 6.4 g/dL — ABNORMAL LOW (ref 6.5–8.1)

## 2021-12-27 LAB — PROTIME-INR
INR: 7.8 (ref 0.8–1.2)
INR: 9.4 (ref 0.8–1.2)
Prothrombin Time: 65.5 seconds — ABNORMAL HIGH (ref 11.4–15.2)
Prothrombin Time: 76.2 seconds — ABNORMAL HIGH (ref 11.4–15.2)

## 2021-12-27 LAB — URINALYSIS, ROUTINE W REFLEX MICROSCOPIC
Bilirubin Urine: NEGATIVE
Glucose, UA: NEGATIVE mg/dL
Ketones, ur: NEGATIVE mg/dL
Nitrite: NEGATIVE
Protein, ur: NEGATIVE mg/dL
Specific Gravity, Urine: 1.016 (ref 1.005–1.030)
WBC, UA: >50 WBC/hpf — ABNORMAL HIGH (ref 0–5)
pH: 5 (ref 5.0–8.0)

## 2021-12-27 LAB — CREATININE, SERUM
Creatinine, Ser: 2.36 mg/dL — ABNORMAL HIGH (ref 0.44–1.00)
GFR, Estimated: 20 mL/min — ABNORMAL LOW (ref >60)

## 2021-12-27 LAB — MAGNESIUM: Magnesium: 2.3 mg/dL (ref 1.7–2.4)

## 2021-12-27 LAB — RESP PANEL BY RT-PCR (FLU A&B, COVID) ARPGX2
Influenza A by PCR: NEGATIVE
Influenza B by PCR: NEGATIVE
SARS Coronavirus 2 by RT PCR: NEGATIVE

## 2021-12-27 LAB — PHOSPHORUS: Phosphorus: 4.3 mg/dL (ref 2.5–4.6)

## 2021-12-27 MED ORDER — HYDROMORPHONE HCL 1 MG/ML IJ SOLN
0.5000 mg | Freq: Once | INTRAMUSCULAR | Status: DC
  Administered 2021-12-27: 0.5 mg via INTRAVENOUS
  Filled 2021-12-27: qty 1

## 2021-12-27 MED ORDER — SODIUM CHLORIDE 0.9 % IV SOLN
2.0000 g | Freq: Once | INTRAVENOUS | Status: DC
  Administered 2021-12-27: 2 g via INTRAVENOUS
  Filled 2021-12-27: qty 2

## 2021-12-27 MED ORDER — METOPROLOL TARTRATE 25 MG PO TABS
25.0000 mg | ORAL_TABLET | Freq: Two times a day (BID) | ORAL | Status: DC
  Filled 2021-12-27: qty 1

## 2021-12-27 MED ORDER — LETROZOLE 2.5 MG PO TABS
2.5000 mg | ORAL_TABLET | Freq: Every day | ORAL | Status: DC
  Administered 2021-12-27 – 2021-12-30 (×3): 2.5 mg via ORAL
  Filled 2021-12-27 (×5): qty 1

## 2021-12-27 MED ORDER — FAMOTIDINE 20 MG PO TABS
20.0000 mg | ORAL_TABLET | Freq: Two times a day (BID) | ORAL | Status: DC
  Administered 2021-12-27 (×2): 20 mg via ORAL
  Filled 2021-12-27 (×2): qty 1

## 2021-12-27 MED ORDER — SODIUM CHLORIDE 0.9 % IV SOLN
3.0000 g | Freq: Two times a day (BID) | INTRAVENOUS | Status: DC
  Administered 2021-12-27 – 2021-12-31 (×9): 3 g via INTRAVENOUS
  Filled 2021-12-27 (×10): qty 8

## 2021-12-27 MED ORDER — ONDANSETRON HCL 4 MG/2ML IJ SOLN
4.0000 mg | Freq: Once | INTRAMUSCULAR | Status: AC
  Administered 2021-12-27: 4 mg via INTRAVENOUS
  Filled 2021-12-27: qty 2

## 2021-12-27 MED ORDER — HYDROCODONE-ACETAMINOPHEN 5-325 MG PO TABS: 1.0000 | ORAL_TABLET | Freq: Four times a day (QID) | ORAL | Status: DC | PRN

## 2021-12-27 MED ORDER — METRONIDAZOLE 500 MG/100ML IV SOLN
500.0000 mg | Freq: Once | INTRAVENOUS | Status: DC
  Administered 2021-12-27: 500 mg via INTRAVENOUS
  Filled 2021-12-27: qty 100

## 2021-12-27 MED ORDER — ACETAMINOPHEN 325 MG PO TABS
650.0000 mg | ORAL_TABLET | Freq: Three times a day (TID) | ORAL | Status: DC | PRN
  Administered 2022-01-03: 650 mg via ORAL
  Filled 2021-12-27: qty 2

## 2021-12-27 MED ORDER — HEPARIN SODIUM (PORCINE) 5000 UNIT/ML IJ SOLN: 5000.0000 [IU] | Freq: Three times a day (TID) | INTRAMUSCULAR | Status: DC

## 2021-12-27 MED ORDER — LACTATED RINGERS IV SOLN: INTRAVENOUS | Status: AC

## 2021-12-27 MED ORDER — CHLORHEXIDINE GLUCONATE CLOTH 2 % EX PADS
6.0000 | MEDICATED_PAD | Freq: Every day | CUTANEOUS | Status: DC
  Administered 2021-12-27 – 2022-01-05 (×8): 6 via TOPICAL

## 2021-12-27 MED ORDER — HYDROMORPHONE HCL 1 MG/ML IJ SOLN
0.5000 mg | INTRAMUSCULAR | Status: DC | PRN
  Administered 2021-12-27 – 2021-12-28 (×3): 0.5 mg via INTRAVENOUS
  Filled 2021-12-27 (×2): qty 0.5
  Filled 2021-12-27: qty 1

## 2021-12-27 MED ORDER — LACTATED RINGERS IV SOLN: INTRAVENOUS | Status: DC

## 2021-12-27 MED ORDER — ENSURE ENLIVE PO LIQD: 237.0000 mL | Freq: Two times a day (BID) | ORAL | Status: DC

## 2021-12-27 MED ORDER — LACTATED RINGERS IV BOLUS (SEPSIS): 1000.0000 mL | Freq: Once | INTRAVENOUS | Status: DC

## 2021-12-27 MED ORDER — VALACYCLOVIR HCL 500 MG PO TABS
500.0000 mg | ORAL_TABLET | Freq: Two times a day (BID) | ORAL | Status: DC
  Administered 2021-12-27 – 2022-01-05 (×17): 500 mg via ORAL
  Filled 2021-12-27 (×21): qty 1

## 2021-12-27 MED ORDER — POLYETHYLENE GLYCOL 3350 17 G PO PACK: 17.0000 g | PACK | Freq: Every day | ORAL | Status: DC | PRN

## 2021-12-27 MED ORDER — FAMOTIDINE 20 MG PO TABS
20.0000 mg | ORAL_TABLET | Freq: Every day | ORAL | Status: DC
  Administered 2021-12-28 – 2022-01-03 (×5): 20 mg via ORAL
  Filled 2021-12-27 (×6): qty 1

## 2021-12-27 MED ORDER — ONDANSETRON HCL 4 MG/2ML IJ SOLN
4.0000 mg | Freq: Four times a day (QID) | INTRAMUSCULAR | Status: DC | PRN
  Administered 2021-12-27: 12:00:00 4 mg via INTRAVENOUS
  Filled 2021-12-27: qty 2

## 2021-12-27 MED ORDER — ONDANSETRON HCL 4 MG PO TABS: 4.0000 mg | ORAL_TABLET | Freq: Four times a day (QID) | ORAL | Status: DC | PRN

## 2021-12-27 MED ORDER — HYDROMORPHONE HCL 1 MG/ML IJ SOLN: 1.0000 mg | INTRAMUSCULAR | Status: DC | PRN

## 2021-12-27 MED ORDER — ORAL CARE MOUTH RINSE
15.0000 mL | Freq: Two times a day (BID) | OROMUCOSAL | Status: DC
  Administered 2021-12-27 – 2021-12-31 (×8): 15 mL via OROMUCOSAL

## 2021-12-27 MED ORDER — METOPROLOL TARTRATE 25 MG PO TABS
50.0000 mg | ORAL_TABLET | Freq: Two times a day (BID) | ORAL | Status: DC
  Administered 2021-12-27: 50 mg via ORAL
  Filled 2021-12-27 (×2): qty 2

## 2021-12-27 MED ORDER — PANTOPRAZOLE SODIUM 40 MG PO TBEC
40.0000 mg | DELAYED_RELEASE_TABLET | Freq: Every day | ORAL | Status: DC
  Administered 2021-12-27 – 2021-12-30 (×4): 40 mg via ORAL
  Filled 2021-12-27 (×4): qty 1

## 2021-12-27 MED ORDER — ALBUTEROL SULFATE (2.5 MG/3ML) 0.083% IN NEBU
3.0000 mL | INHALATION_SOLUTION | Freq: Four times a day (QID) | RESPIRATORY_TRACT | Status: DC | PRN
  Administered 2021-12-28 – 2022-01-03 (×2): 3 mL via RESPIRATORY_TRACT
  Filled 2021-12-27: qty 3

## 2021-12-27 MED ORDER — PHYTONADIONE 5 MG PO TABS
5.0000 mg | ORAL_TABLET | Freq: Once | ORAL | Status: AC
  Administered 2021-12-27: 5 mg via ORAL
  Filled 2021-12-27: qty 1

## 2021-12-27 NOTE — Plan of Care (Addendum)
Yellow MEWS overnight for AF w/RVR, tachypnea. Charge RN, RR RN, and house coverage NP notified per protocol. Patient is sick but stable. No respiratory distress. Spontaneously arousable. SBP adequate. RR RN called to bedside to assess, no escalation warranted at this time.   0700 hrs: Update - INR 5.2 (critical). Dr. Maren Beach paged.   Problem: Education: Goal: Knowledge of General Education information will improve Description: Including pain rating scale, medication(s)/side effects and non-pharmacologic comfort measures Outcome: Not Progressing   Problem: Health Behavior/Discharge Planning: Goal: Ability to manage health-related needs will improve Outcome: Not Progressing   Problem: Clinical Measurements: Goal: Ability to maintain clinical measurements within normal limits will improve Outcome: Not Progressing Goal: Will remain free from infection Outcome: Not Progressing Goal: Diagnostic test results will improve Outcome: Not Progressing Goal: Respiratory complications will improve Outcome: Not Progressing Goal: Cardiovascular complication will be avoided Outcome: Not Progressing   Problem: Activity: Goal: Risk for activity intolerance will decrease Outcome: Not Progressing   Problem: Nutrition: Goal: Adequate nutrition will be maintained Outcome: Not Progressing   Problem: Coping: Goal: Level of anxiety will decrease Outcome: Not Progressing   Problem: Elimination: Goal: Will not experience complications related to bowel motility Outcome: Not Progressing Goal: Will not experience complications related to urinary retention Outcome: Not Progressing   Problem: Pain Managment: Goal: General experience of comfort will improve Outcome: Not Progressing   Problem: Safety: Goal: Ability to remain free from injury will improve Outcome: Not Progressing   Problem: Skin Integrity: Goal: Risk for impaired skin integrity will decrease Outcome: Not Progressing

## 2021-12-27 NOTE — ED Notes (Signed)
Called Lab for add-ons

## 2021-12-27 NOTE — Progress Notes (Signed)
Received patient from ED via stretcher.  Patient is alert, oriented x 4.  Oriented to room and unit routine.  Assisted in position of comfort.  Call bell within reach, needs addressed.

## 2021-12-27 NOTE — Progress Notes (Signed)
Patient seen and examined personally, I reviewed the chart, history and physical and admission note, done by admitting physician this morning and agree with the same with following addendum.  Please refer to the morning admission note for more detailed plan of care.  Briefly,  81 year old female with history of breast cancer, lung cancer, colon cancer with metastatic to the liver, asthma respiratory failure with chronic hypoxia on 2 L nasal cannula A. fib, CKD stage IIIb hypertension, HLD, obesity recent hospitalization secondary to jaundice status post biliary stenting presents to the ED with 2 weeks of worsening abdominal pain in mid abdomen and radiating across bilateral to the back.  She had constipation to laxative wk ago ended up with diarrhea. At baseline uses cane lives with a friend after she was told she only has 2 years to live about 5 years ago.  In the ED CT lumbar CT abdomen pelvis was done that showed new liver mass progressive metastatic lesion with necrosis versus abscess, and changes of acute pancreatitis, placed on IV fluids antibiotics GI was consulted and admitted  Seen and examined this morning complains of mild abdominal discomfort, blood pressure soft Ill looking obese frail.  Currently no nausea or vomiting  Abdominal pain Nausea/vomiting Acute pancreatitis New liver mass concerning for progressive mets with necrosis versus abscess  Leukocytosis: Continue Unasyn GI is consulted IV fluids antiemetics analgesics bowel rest.  Chronic anemia Chronic A. Fib- on coumadin inr supratherapeutic. Inr daily Recent Labs  Lab 12/27/21 0013  INR 7.8*    GERD Chronic hypoxic respiratory failure Hypertension-bp soft- hold amlodipine, continue metoprolol  Goals of care patient with multiple cancers, poor prognosis, consult palliative medicine

## 2021-12-27 NOTE — ED Provider Notes (Signed)
[facility] DEPT Provider Note   CSN: [id number] Arrival date & time: 12/26/21  2028     History    Chief Complaint  Patient presents with   Back Pain    Tammie [name] is a 81 y.o. female.  HPI  81 year old female with a history of colon cancer, breast cancer, lung cancer, A. fib, hyperlipidemia, hypertension, presents to the emergency department today for evaluation of abdominal pain and back pain.  Patient complains of pain to the left upper quadrant of her abdomen.  Pain radiates around her mid/lower back.  No reported new injury.  She did have some nausea and vomiting 2 days ago.  Vomiting is since improved.  She has had night sweats as well.  Pain is constant and severe in nature.  Home Medications Prior to Admission medications   Medication Sig Start Date End Date Taking? Authorizing Provider  acetaminophen (TYLENOL) 650 MG CR tablet Take 650 mg by mouth 3 (three) times daily as needed for pain.     [provider]  albuterol (VENTOLIN HFA) 108 (90 Base) MCG/ACT inhaler INHALE 2 PUFFS BY MOUTH EVERY 6 HOURS AS NEEDED FOR WHEEZING FOR SHORTNESS OF BREATH 12/22/21   [name] A, FNP  amLODipine (NORVASC) 10 MG tablet Take 1 tablet by mouth once daily 12/22/21   [name] A, FNP  diphenhydrAMINE (BENADRYL) 25 MG tablet Take 25 mg by mouth every 6 (six) hours as needed for allergies.    [provider]  famotidine (PEPCID) 20 MG tablet Take 20 mg by mouth 2 (two) times daily. 11/27/21   [provider]  Ferrous Sulfate (IRON) 142 (45 Fe) MG TBCR Take 1 tablet by mouth every evening.    [provider]  Flaxseed, Linseed, (FLAXSEED OIL PO) Take 1 tablet by mouth at bedtime.    [provider]  furosemide (LASIX) 20 MG tablet Take 1 tablet by mouth twice daily 12/22/21   [name] A, FNP  gabapentin (NEURONTIN) 300 MG capsule Take by mouth.    [provider]  Garlic 7989 MG CAPS Take  1,000 mg by mouth daily.    [provider]  letrozole (FEMARA) 2.5 MG tablet Take 1 tablet (2.5 mg total) by mouth daily. 07/28/21   Nicholas Lose, MD  metoprolol tartrate (LOPRESSOR) 50 MG tablet Take 1 tablet (50 mg total) by mouth 2 (two) times daily. 11/16/21   Shelly Coss, MD  nystatin (MYCOSTATIN/NYSTOP) powder Apply 1 application topically 3 (three) times daily. 11/17/20   [name], FNP  Omega-3 Fatty Acids (FISH OIL) 1000 MG CAPS Take 1 capsule by mouth daily. Patient not taking: Reported on 12/17/2021    [provider]  pantoprazole (PROTONIX) 40 MG tablet Take 1 tablet (40 mg total) by mouth daily. 11/17/21   Shelly Coss, MD  polyethylene glycol (MIRALAX) 17 g packet Take 17 g by mouth daily as needed. 11/16/21   Shelly Coss, MD  prochlorperazine (COMPAZINE) 10 MG tablet Take 1 tablet (10 mg total) by mouth every 6 (six) hours as needed for nausea or vomiting. 12/07/21   Curt Bears, MD  promethazine (PHENERGAN) 25 MG tablet Take 1 tablet (25 mg total) by mouth every 8 (eight) hours as needed for nausea or vomiting. 12/20/21   Curt Bears, MD  simvastatin (ZOCOR) 10 MG tablet TAKE 1 TABLET BY MOUTH ONCE DAILY 6 IN THE EVENING 12/15/21   [name] A, FNP  valACYclovir (VALTREX) 500 MG tablet Take 1  tablet (500 mg total) by mouth 2 (two) times daily. 12/07/21   Curt Bears, MD  warfarin (COUMADIN) 2.5 MG tablet TAKE ONE TABLET BY MOUTH ONCE DAILY EXCEPT 1/2 TABLET ON FRIDAYS. Patient not taking: Reported on 12/17/2021 08/13/21   [name] A, FNP      Allergies    Tape, Contrast media [iodinated contrast media], Iohexol, Sulfa antibiotics, and Sulfamethoxazole-trimethoprim    Review of Systems   Review of Systems  Constitutional:  Positive for chills and diaphoresis.  HENT:  Negative for ear pain and sore throat.   Eyes:  Negative for visual disturbance.  Respiratory:  Positive for shortness of breath. Negative for cough.    Cardiovascular:  Negative for chest pain.  Gastrointestinal:  Positive for abdominal pain, nausea and vomiting. Negative for constipation and diarrhea.  Genitourinary:  Negative for dysuria and hematuria.  Musculoskeletal:  Positive for back pain.  Skin:  Negative for rash.  Neurological:  Negative for seizures and syncope.  All other systems reviewed and are negative.  Physical Exam Updated Vital Signs BP (!) 101/49    Pulse (!) 108    Temp 97.9 F (36.6 C) (Oral)    Resp 16    Ht 5\' 10"  (1.778 m)    Wt 117.9 kg    SpO2 96%    BMI 37.31 kg/m  Physical Exam Vitals and nursing note reviewed.  Constitutional:      General: She is not in acute distress.    Appearance: She is well-developed. She is ill-appearing.  HENT:     Head: Normocephalic and atraumatic.  Eyes:     Conjunctiva/sclera: Conjunctivae normal.  Cardiovascular:     Rate and Rhythm: Regular rhythm. Tachycardia present.     Heart sounds: No murmur heard. Pulmonary:     Effort: Pulmonary effort is normal. No respiratory distress.     Breath sounds: Rales (LLL) present.  Abdominal:     General: Bowel sounds are normal.     Palpations: Abdomen is soft.     Tenderness: There is abdominal tenderness (LUQ). There is no guarding or rebound.  Musculoskeletal:     Cervical back: Neck supple.  Skin:    General: Skin is warm and dry.     Capillary Refill: Capillary refill takes less than 2 seconds.  Neurological:     Mental Status: She is alert.  Psychiatric:        Mood and Affect: Mood normal.    ED Results / Procedures / Treatments   Labs (all labs ordered are listed, but only abnormal results are displayed) Labs Reviewed  CBC WITH DIFFERENTIAL/PLATELET - Abnormal; Notable for the following components:      Result Value   WBC 16.1 (*)    Neutro Abs 14.7 (*)    Lymphs Abs 0.4 (*)    Abs Immature Granulocytes 0.14 (*)    All other components within normal limits  COMPREHENSIVE METABOLIC PANEL - Abnormal;  Notable for the following components:   CO2 21 (*)    Glucose, Bld 107 (*)    BUN 45 (*)    Creatinine, Ser 2.17 (*)    Calcium 8.7 (*)    Albumin 2.4 (*)    AST 83 (*)    Alkaline Phosphatase 304 (*)    Total Bilirubin 3.7 (*)    GFR, Estimated 22 (*)    All other components within normal limits  LIPASE, BLOOD - Abnormal; Notable for the following components:   Lipase 75 (*)  All other components within normal limits  CULTURE, BLOOD (SINGLE)  CULTURE, BLOOD (ROUTINE X 2)  CULTURE, BLOOD (ROUTINE X 2)  LACTIC ACID, PLASMA  URINALYSIS, ROUTINE W REFLEX MICROSCOPIC  PROTIME-INR    EKG EKG Interpretation  Date/Time:  Saturday December 26 2021 23:24:53 EST Ventricular Rate:  110 PR Interval:    QRS Duration: 99 QT Interval:  317 QTC Calculation: 429 R Axis:   73 Text Interpretation: Atrial fibrillation Minimal ST depression, inferior leads Confirmed by Ripley Fraise [phone]) on 12/27/2021 12:12:22 AM  Radiology CT ABDOMEN PELVIS WO CONTRAST  Result Date: 12/26/2021 CLINICAL DATA:  Prior history of lung, colon and breast cancer, presents today with abdominal pain and low back pain. EXAM: CT ABDOMEN AND PELVIS WITHOUT CONTRAST TECHNIQUE: Multidetector CT imaging of the abdomen and pelvis was performed following the standard protocol without IV contrast. COMPARISON:  There are numerous prior CTs. The 2 most recent are noncontrast CTs dated 11/12/2021 and 08/04/2021 FINDINGS: Lower chest: A subsolid posterior pleural-based right lower lobe pulmonary nodule measures 11.3 mm today, on 11/12/2021 measuring 9.5 mm with surrounding haziness. Lung bases show scattered scar-like opacities without further significant findings. There are postsurgical changes in the outer right breast and dystrophic calcifications in both breasts. Hepatobiliary: There has been interval stenting of the right and left intrahepatic main ducts, with the left sided stent extending down the CBD into the duodenum. In  left lobe lateral segment there is new demonstration of a sizable heterogeneous mass measuring 8.4 x 5.4 cm on series 1 axial 17 with scattered fluid pockets, largest individual fluid pocket approaching 4 cm and could represent a multilocular abscess or metastatic liver disease. Infectious process may be favored given that the last study was only about 6 weeks ago. There is additional new rounded low-density lesion more laterally within this segment measuring 3.0 cm and 9.6 Hounsfield units and a smaller low-density lesion more inferiorly in this segment measuring 1.2 cm and 16.2 Hounsfield units. There is still mild intrahepatic biliary dilatation in both lobes but it is less pronounced than previously. An ill-defined presumably conglomerate nodal mass at the porta hepatis today is estimated to measure 5.0 x 3.9 cm and is difficult to separate from the descending duodenum which it abuts at its posterior edge. Previously the mass measured about 4.6 x 2.9 cm. The right lobe demonstrated no focal mass, as visualized without the use of IV contrast. Gallbladder contains a single 2.5 cm stone is distended without wall thickening or pericholecystic edema. Pancreas: Today there is increased pancreatic edema with mild peripancreatic edema, findings consistent with likely interstitial pancreatitis. No pancreatic ductal dilatation is seen. Laboratory and clinical correlation suggested. No pancreatic mass is seen without contrast. Spleen: Slightly prominent but otherwise unremarkable without contrast. Adrenals/Urinary Tract: There is no adrenal mass. Renal cysts are again noted, largest on the left measuring up to nearly 9 cm in the upper pole with 4.7 cm cyst anterior to this. There is a 3 mm nonobstructing caliceal stone in the inferior pole left kidney. Again noted is a right renal pelvis stone measuring 1.3 x 0.7 cm. There is a 1 cm low-density lesion of the posterior cortex of the right kidney measuring 45 Hounsfield  units which has been noted on multiple prior studies. There is no obstructing urinary stone or hydronephrosis. The bladder is contracted and not well seen. Stomach/Bowel: No dilatation or wall thickening including the appendix. Uncomplicated sigmoid diverticulosis. Vascular/Lymphatic: Conglomerate nodal mass described above in the porta hepatis. There is  aortoiliac atherosclerosis without further visible adenopathy. Reproductive: The uterus is absent.  No adnexal mass is seen. Other: Small umbilical and inguinal fat hernias. No free air, hemorrhage or fluid. Musculoskeletal: Osteopenia and degenerative change of the spine. Slight lumbar levoscoliosis. No worrisome regional skeletal lesion. IMPRESSION: 1. 8.4 x 5.4 cm heterogeneous mass in the left lobe of the liver in the lateral segment with fluid pockets measuring up to nearly 4 cm. This could be a metastasis with areas of internal necrosis or an abscess with multiple locules. Infectious process is favored given the relatively short interval time frame of the appearance of this since the last study. 2. An additional 3.0 cm low-density lesion more laterally within the segment is noted as well as a 1.2 cm low-density lesion more inferiorly in this segment, as above could be metastases or due to infection. 3. Interval biliary stenting. There is still mild intrahepatic biliary prominence but less pronounced than previously. The gallbladder is distended and again contains a 2.5 cm stone but there is no appreciable wall thickening. 4. 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 No other adenopathy is visible. This does abut the superior aspect of the pancreatic head and neck junction but probably does not arise from the pancreas. Evaluation limited without IV contrast. 5. Evidence of acute interstitial pancreatitis. Laboratory and clinical correlation suggested. No peripancreatic fluid collections. 6. Bilateral nephrolithiasis and  cysts. 7. Subsolid right lower lobe pleural-based nodule slightly larger than previously. Electronically Signed   By: Telford Nab M.D.   On: 12/26/2021 22:36   CT L-SPINE NO CHARGE  Result Date: 12/26/2021 CLINICAL DATA:  Initial evaluation for acute lower back pain. EXAM: CT LUMBAR SPINE WITHOUT CONTRAST TECHNIQUE: Multidetector CT imaging of the lumbar spine was performed without intravenous contrast administration. Multiplanar CT image reconstructions were also generated. COMPARISON:  None available. FINDINGS: Segmentation: Standard. Lowest well-formed disc space labeled the L5-S1 level. Alignment: Mild levoscoliosis with apex at L2-3. Alignment otherwise normal with preservation of the normal lumbar lordosis. No listhesis. Vertebrae: Vertebral body height maintained without acute or chronic fracture. Visualized sacrum and pelvis intact. SI joints symmetric and normal. No worrisome lytic or blastic osseous lesions. Paraspinal and other soft tissues: Paraspinous soft tissues demonstrate no acute finding. Advanced aorto bi-iliac atherosclerotic disease noted. Probable large exophytic left renal cyst partially visualized. 4 mm nonobstructive left renal calculus noted. 1.3 cm stone positioned within the right renal pelvis with associated localized pelviectasis. No overt hydronephrosis. Additional note made of a approximate 1 cm exophytic hyperdense lesion extending from the posterior aspect of the interpolar right kidney, nonspecific, but likely a proteinaceous and/or hemorrhagic cyst. Disc levels: T12-L1: Negative interspace. Mild facet hypertrophy. No stenosis. L1-2: Degenerative intervertebral disc space narrowing with disc desiccation and diffuse disc bulge, slightly eccentric to the left. Mild facet hypertrophy. No spinal stenosis. Foramina remain patent. L2-3: Mild intervertebral disc space narrowing with diffuse disc bulge and disc desiccation. Disc bulging slightly asymmetric to the right. Mild facet  hypertrophy. Resultant mild canal with mild right worse than left lateral recess stenosis. Mild right L2 foraminal narrowing. Left neural foramina remains patent. L3-4: Mild right eccentric disc bulge. Moderate right worse than left facet hypertrophy. Resultant mild canal with bilateral lateral recess stenosis, worse on the right. Mild right L3 foraminal narrowing. Left neural foramina remains patent. L4-5: Diffuse disc bulge, asymmetric to the right. Moderate facet and ligament flavum hypertrophy. Resultant moderate to severe canal with bilateral subarticular stenosis. Moderate left  L4 foraminal narrowing. Right neural foramina remains patent. L5-S1: Minimal disc bulge. Moderate facet hypertrophy. No significant spinal stenosis. Foramina remain patent. IMPRESSION: 1. No acute abnormality within the lumbar spine. 2. Multilevel degenerative spondylosis and facet hypertrophy. Findings most pronounced at L4-5 where there is resultant resulting in moderate to severe canal with bilateral subarticular stenosis, with moderate left L4 foraminal narrowing. 3. Bilateral nephrolithiasis, with 1.3 cm stone positioned within the right renal pelvis. Associated localized pelviectasis without overt hydronephrosis. 4. Aortic Atherosclerosis (ICD10-I70.0). Electronically Signed   By: Jeannine Boga M.D.   On: 12/26/2021 22:36    Procedures Procedures  1230 AM Cardiac monitoring reveals sinus tach (Rate & rhythm), as reviewed and interpreted by me. Cardiac monitoring was ordered due to tachycardia and to monitor patient for dysrhythmia.   Medications Ordered in ED Medications  ondansetron (ZOFRAN) injection 4 mg (has no administration in time range)  lactated ringers infusion (has no administration in time range)  Ampicillin-Sulbactam (UNASYN) 3 g in sodium chloride 0.9 % 100 mL IVPB (has no administration in time range)    ED Course/ Medical Decision Making/ A&P                           Medical Decision  Making  81 year old female presenting for evaluation of abdominal and back pain.  Reviewed/interpreted labs CBC with leukocytosis 16,000, with left shift.  No anemia, cytopenia CMP with slightly low bicarb, elevated BUN/creatinine which is slightly worse from prior, LFTs within normal limits, remainder of CMP grossly unremarkable Lactic acid negative Lipase marginally elevated UA pending on admission   EKG - Atrial fibrillation Minimal ST depression, inferior leads  Reviewed/interpreted imaging CT lumbar - No acute abnormality, degenerative changes noted. Bilat kidney stones w/o hydronephrosis. CT abd/pelvis -  with new liver mass concerning for progressive mets with necrosis vs abscess (more likely abscess), also with pancreatitis.   Pt critically ill with likely liver abscess and pancreatitis. She was started on broad spectrum abx, given pain meds, nausea meds and ivf. Will require admission for further management.   1:05 AM CONSULT with Dr. Dyann Kief who accepts patient for admission   Final Clinical Impression(s) / ED Diagnoses Final diagnoses:  Liver abscess    Rx / DC Orders ED Discharge Orders     None         Bishop Dublin 12/27/21 0132    Ripley Fraise, MD 12/27/21 [phone]

## 2021-12-27 NOTE — Consult Note (Signed)
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 counseling/coordinating care for liver abscess aspiration with possible drain placement.

## 2021-12-27 NOTE — H&P (Addendum)
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 attending/consulting TR[facility] provider listed and b) the [facility] team listed Log into [email or web address] and use [facility]'s universal password to access. If you do not have the password, please contact the hospital operator. Locate the [facility] provider you are looking for under [facility] and page to a number that you can be directly reached. If you still have difficulty reaching the provider, please page the [facility] (Director on Call) for  the Hospitalists listed on amion for assistance.  12/27/2021, 1:57 AM
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
## 2021-12-28 ENCOUNTER — Inpatient Hospital Stay (HOSPITAL_COMMUNITY): Payer: HMO

## 2021-12-28 DIAGNOSIS — G893 Neoplasm related pain (acute) (chronic): Secondary | ICD-10-CM

## 2021-12-28 DIAGNOSIS — Z7189 Other specified counseling: Secondary | ICD-10-CM

## 2021-12-28 DIAGNOSIS — R1013 Epigastric pain: Secondary | ICD-10-CM

## 2021-12-28 DIAGNOSIS — K831 Obstruction of bile duct: Secondary | ICD-10-CM

## 2021-12-28 DIAGNOSIS — C799 Secondary malignant neoplasm of unspecified site: Secondary | ICD-10-CM

## 2021-12-28 LAB — CBC
HCT: 31.9 % — ABNORMAL LOW (ref 36.0–46.0)
Hemoglobin: 9.9 g/dL — ABNORMAL LOW (ref 12.0–15.0)
MCH: 27 pg (ref 26.0–34.0)
MCHC: 31 g/dL (ref 30.0–36.0)
MCV: 87.2 fL (ref 80.0–100.0)
Platelets: 315 10*3/uL (ref 150–400)
RBC: 3.66 MIL/uL — ABNORMAL LOW (ref 3.87–5.11)
RDW: 15.2 % (ref 11.5–15.5)
WBC: 15.7 10*3/uL — ABNORMAL HIGH (ref 4.0–10.5)
nRBC: 0 % (ref 0.0–0.2)

## 2021-12-28 LAB — BASIC METABOLIC PANEL
Anion gap: 7 (ref 5–15)
BUN: 48 mg/dL — ABNORMAL HIGH (ref 8–23)
CO2: 25 mmol/L (ref 22–32)
Calcium: 8.1 mg/dL — ABNORMAL LOW (ref 8.9–10.3)
Chloride: 104 mmol/L (ref 98–111)
Creatinine, Ser: 2.05 mg/dL — ABNORMAL HIGH (ref 0.44–1.00)
GFR, Estimated: 24 mL/min — ABNORMAL LOW (ref >60)
Glucose, Bld: 125 mg/dL — ABNORMAL HIGH (ref 70–99)
Potassium: 3.8 mmol/L (ref 3.5–5.1)
Sodium: 136 mmol/L (ref 135–145)

## 2021-12-28 LAB — HEPATIC FUNCTION PANEL
ALT: 34 U/L (ref 0–44)
AST: 59 U/L — ABNORMAL HIGH (ref 15–41)
Albumin: 1.8 g/dL — ABNORMAL LOW (ref 3.5–5.0)
Alkaline Phosphatase: 251 U/L — ABNORMAL HIGH (ref 38–126)
Bilirubin, Direct: 2.2 mg/dL — ABNORMAL HIGH (ref 0.0–0.2)
Indirect Bilirubin: 1.3 mg/dL — ABNORMAL HIGH (ref 0.3–0.9)
Total Bilirubin: 3.5 mg/dL — ABNORMAL HIGH (ref 0.3–1.2)
Total Protein: 6.3 g/dL — ABNORMAL LOW (ref 6.5–8.1)

## 2021-12-28 LAB — PROTIME-INR
INR: 5.2 (ref 0.8–1.2)
Prothrombin Time: 47.9 seconds — ABNORMAL HIGH (ref 11.4–15.2)

## 2021-12-28 LAB — GLUCOSE, CAPILLARY: Glucose-Capillary: 114 mg/dL — ABNORMAL HIGH (ref 70–99)

## 2021-12-28 MED ORDER — HYDROMORPHONE HCL 1 MG/ML IJ SOLN
0.5000 mg | INTRAMUSCULAR | Status: DC | PRN
  Administered 2021-12-28 – 2021-12-30 (×5): 0.5 mg via INTRAVENOUS
  Filled 2021-12-28 (×5): qty 0.5

## 2021-12-28 MED ORDER — PHYTONADIONE 5 MG PO TABS
2.5000 mg | ORAL_TABLET | Freq: Once | ORAL | Status: AC
  Administered 2021-12-28: 2.5 mg via ORAL
  Filled 2021-12-28: qty 1

## 2021-12-28 MED ORDER — SODIUM CHLORIDE 0.9 % IV SOLN: INTRAVENOUS | Status: DC | PRN

## 2021-12-28 MED ORDER — SODIUM CHLORIDE 0.9% FLUSH
10.0000 mL | Freq: Two times a day (BID) | INTRAVENOUS | Status: DC
  Administered 2021-12-28 – 2022-01-02 (×7): 10 mL
  Administered 2022-01-02: 40 mL
  Administered 2022-01-03 – 2022-01-05 (×5): 10 mL

## 2021-12-28 MED ORDER — LACTATED RINGERS IV SOLN: INTRAVENOUS | Status: DC

## 2021-12-28 MED ORDER — SODIUM CHLORIDE 0.9% IV SOLUTION: Freq: Once | INTRAVENOUS | Status: DC

## 2021-12-28 MED ORDER — BOOST / RESOURCE BREEZE PO LIQD CUSTOM
1.0000 | Freq: Three times a day (TID) | ORAL | Status: DC
  Administered 2021-12-30: 1 via ORAL

## 2021-12-28 MED ORDER — SODIUM CHLORIDE 0.9% FLUSH
10.0000 mL | INTRAVENOUS | Status: DC | PRN
  Administered 2022-01-05: 10 mL

## 2021-12-28 MED ORDER — METOPROLOL TARTRATE 50 MG PO TABS
50.0000 mg | ORAL_TABLET | Freq: Two times a day (BID) | ORAL | Status: DC
  Administered 2021-12-28 – 2021-12-30 (×4): 50 mg via ORAL
  Filled 2021-12-28 (×6): qty 1

## 2021-12-28 MED ORDER — LORAZEPAM 2 MG/ML IJ SOLN
0.5000 mg | Freq: Four times a day (QID) | INTRAMUSCULAR | Status: DC | PRN
  Administered 2021-12-28 – 2021-12-30 (×2): 0.5 mg via INTRAVENOUS
  Filled 2021-12-28 (×2): qty 1

## 2021-12-28 MED ORDER — METOPROLOL TARTRATE 5 MG/5ML IV SOLN: 2.5000 mg | INTRAVENOUS | Status: DC | PRN

## 2021-12-28 MED ORDER — GLYCOPYRROLATE 0.2 MG/ML IJ SOLN
0.2000 mg | INTRAMUSCULAR | Status: DC | PRN
  Administered 2021-12-31: 0.2 mg via INTRAVENOUS
  Filled 2021-12-28: qty 1

## 2021-12-28 NOTE — Progress Notes (Signed)
IR chart follow up. WBC some improved. INR still elevated >5 Afebrile  Continue reversal of coagulopathy Hopefully can proceed with drain procedure tomorrow. May resume previous diet today, made NPO p MN for tomorrow.  Ascencion Dike PA-C Interventional Radiology 12/28/2021 8:49 AM

## 2021-12-28 NOTE — Progress Notes (Signed)
PROGRESS NOTE    MEGEN MADEWELL  JGG:[id number] DOB: 10/26/1941 DOA: 12/26/2021 PCP: [name], FNP   Chief Complaint  Patient presents with   Back Pain  Brief Narrative/Hospital Course: [name], 81 y.o. 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 fib, CKD stage IIIb hypertension, HLD, obesity recent hospitalization secondary to jaundice status post biliary stenting presents to the ED with 2 weeks of worsening abdominal pain in mid abdomen and radiating across bilateral to the back.  She had constipation to laxative wk ago ended up with diarrhea. At baseline uses cane lives with a friend after she was told she only has 2 years to live about 5 years ago.   In the ED CT lumbar CT abdomen pelvis was done that showed new liver mass progressive metastatic lesion with necrosis versus abscess, and changes of acute pancreatitis, placed on IV fluids antibiotics GI was consulted and admitted.  She had supratherapeutic INR in 9 and Coumadin was held   Subjective: Seen and examined this morning. Overnight heart rate at times high, patient has been sleepy-but arousable This morning she is alert awake oriented x3.  Earlier she was tachypneic but was having pain Labs this morning improving WBC count and renal function, INR downtrending Later in the afternoon patient somewhat slow to respond and also experiencing periods of apnea Came and examined at bedside-denies any discomfort was more alert awake, she gave me her home phone number and was able to reach to her friend Mortimer Fries who is on the way to the hospital.  Assessment & Plan:  Abdominal pain Nausea/vomiting Acute pancreatitis New liver mass concerning for progressive mets with necrosis versus abscess( more likely)  Sepsis POA Patient does meet sepsis criteria with leukocytosis tachycardia and a potential  source liver abscess. Continue Unasyn,IVD,antiemetics analgesics bowel rest.  GI recommending possible ERCP stent- and IR biopsy for abscess-waiting for INR to improve prior to biopsy Vitamin K 2.5 mg  x 1 today again. Recent Labs  Lab 12/26/21 2047 12/26/21 2121 12/27/21 0118 12/27/21 0513 12/28/21 0519  WBC 16.1*  --  17.7* 17.0* 15.7*  LATICACIDVEN  --  1.5  --   --   --     Chronic anemia/anemia of chronic disease/anemia of malignancy: Some drop in hemoglobin question from hemodilution. No signs of acute blood loss.Monitor Recent Labs  Lab 12/26/21 2047 12/27/21 0118 12/27/21 0513 12/28/21 0519  HGB 12.1 10.9* 10.0* 9.9*  HCT 38.7 34.6* 32.0* 31.9*    Chronic A. Fib with RVR: Rate in 110s- increase metoprolol to home dose 50 mg bid, INR supratherapeutic remains on hold for procedure.  INR being reversed-downtrending. Recent Labs  Lab 12/27/21 0013 12/27/21 1239 12/28/21 0519  INR 7.8* 9.4* 5.2*    GERD: Continue PPI  Chronic hypoxic respiratory failure:Cont supplemental oxygen provide bronchodilators as needed, incentive spirometry.  HTN:BP controlled-at times in 90s we will keep on IV fluids.Amlodipine on hold, increase metoprolol to home dose  Goals of care patient with metastatic cancer disease, poor baseline functional status and overall poor prognosis.  Palliative care is consulted This morning I discussed with her CODE STATUS she wishes not to receive CPR or intubation and changed to DNR, I reached out to her friend to update-no number available.  Will await for palliative medicine to discuss further. Given few episodes of tachypnea as well as apnea this  morning we revisited CODE STATUS, I called and updated her friend Mortimer Fries, palliative care also came at bedside, plan is to continue current care no escalation of care.  Patient and family understands that she may decompensate Appears to be approaching end-of-life regardless of intervention moving forward. Dr. Domingo Cocking  knows patient and her [place] very well, unable to reach grandson, her friend Mortimer Fries is on the way to the hospital-and I had updated him personally.  Class II Obesity:Patient's Body mass index is 36.92 kg/m.:Will benefit with PCP follow-up, weight loss  healthy lifestyle and outpatient sleep evaluation.  DVT prophylaxis: inr>1.5, scd Code Status:   Code Status: DNR Family Communication: plan of care discussed with patient at bedside.I called her friend Mortimer Fries- no answe on his cell phone but able to reach to home phone once patient provided me the number  Status is: Inpatient Remains inpatient appropriate because: Ongoing management of liver abscess versus necrosis, failure to thrive Disposition: Currently not medically stable for discharge. Anticipated Disposition: TBD  Objective: Vitals last 24 hrs: Vitals:   12/28/21 0600 12/28/21 0831 12/28/21 0911 12/28/21 1118  BP:  122/65 (!) 107/55 (!) 92/58  Pulse:  (!) 119 (!) 107 88  Resp: 19 (!) 30 (!) 22 14  Temp:  98 F (36.7 C)  97.7 F (36.5 C)  TempSrc:  Oral  Oral  SpO2:  99% 99% 97%  Weight:      Height:       Weight change: -2.035 kg  Intake/Output Summary (Last 24 hours) at 12/28/2021 1141 Last data filed at 12/28/2021 0600 Gross per 24 hour  Intake 1694.97 ml  Output --  Net 1694.97 ml   Net IO Since Admission: 1,894.97 mL [12/28/21 1141]   Physical Examination: General exam: Alert awake obese, HEENT:Oral mucosa moist, Ear/Nose WNL grossly,dentition normal. Respiratory system: B/l clear BS, no use of accessory muscle, non tender. Cardiovascular system: S1 & S2 +,No JVD. Gastrointestinal system: Abdomen soft,obese, mildly tender RUQ,ND, BS+. Nervous System:Alert, awake, moving extremities. Extremities: edema none, distal peripheral pulses palpable.  Skin: No rashes, no icterus. MSK: Normal muscle bulk, tone, power.  Medications reviewed:  Scheduled Meds:  Chlorhexidine Gluconate Cloth  6 each Topical  Daily   famotidine  20 mg Oral Daily   feeding supplement  237 mL Oral BID BM   letrozole  2.5 mg Oral Daily   mouth rinse  15 mL Mouth Rinse BID   metoprolol tartrate  50 mg Oral BID   pantoprazole  40 mg Oral Daily   sodium chloride flush  10-40 mL Intracatheter Q12H   valACYclovir  500 mg Oral BID   Continuous Infusions:  sodium chloride 10 mL/hr at 12/28/21 0600   ampicillin-sulbactam (UNASYN) IV Stopped (12/28/21 0244)    Diet Order             Diet NPO time specified Except for: Sips with Meds  Diet effective midnight                          Weight change: -2.035 kg  Wt Readings from Last 3 Encounters:  12/28/21 116.7 kg  12/17/21 115.8 kg  12/07/21 117.9 kg     Consultants:see note  Procedures:see note Antimicrobials: Anti-infectives (From admission, onward)    Start     Dose/Rate Route Frequency Ordered Stop   12/27/21 0200  Ampicillin-Sulbactam (UNASYN) 3 g in sodium chloride 0.9 % 100 mL IVPB  3 g 200 mL/hr over 30 Minutes Intravenous Every 12 hours 12/27/21 0132     12/27/21 0145  valACYclovir (VALTREX) tablet 500 mg        500 mg Oral 2 times daily 12/27/21 0144     12/27/21 0030  ceFEPIme (MAXIPIME) 2 g in sodium chloride 0.9 % 100 mL IVPB  Status:  Discontinued        2 g 200 mL/hr over 30 Minutes Intravenous  Once 12/27/21 0018 12/27/21 0250   12/27/21 0030  metroNIDAZOLE (FLAGYL) IVPB 500 mg  Status:  Discontinued        500 mg 100 mL/hr over 60 Minutes Intravenous  Once 12/27/21 0018 12/27/21 0336      Culture/Microbiology    Component Value Date/Time   SDES  12/27/2021 0051    BLOOD RIGHT ANTECUBITAL Performed at [facility], [place] [address], [place], [place] [address]    SPECREQUEST  12/27/2021 0051    BOTTLES DRAWN AEROBIC AND ANAEROBIC Blood Culture adequate volume Performed at [facility] [address], [place], [place] [address]    CULT  12/27/2021 0051    NO GROWTH 1  DAY Performed at [facility] [address], [place], [place] [address]    REPTSTATUS PENDING 12/27/2021 6734    Other culture-see note  Unresulted Labs (From admission, onward)     Start     Ordered   12/28/21 0500  Protime-INR  Daily,   R      12/27/21 0950   12/28/21 0500  CBC  Daily,   R     Question:  Specimen collection method  Answer:  Lab=Lab collect   12/27/21 1103   12/28/21 1937  Basic metabolic panel  Daily,   R     Question:  Specimen collection method  Answer:  Lab=Lab collect   12/27/21 1103   12/27/21 0051  Blood culture (routine x 2)  BLOOD CULTURE X 2,   STAT      12/27/21 0050          Data Reviewed: I have personally reviewed following labs and imaging studies CBC: Recent Labs  Lab 12/26/21 2047 12/27/21 0118 12/27/21 0513 12/28/21 0519  WBC 16.1* 17.7* 17.0* 15.7*  NEUTROABS 14.7*  --   --   --   HGB 12.1 10.9* 10.0* 9.9*  HCT 38.7 34.6* 32.0* 31.9*  MCV 84.5 85.4 87.0 87.2  PLT 395 378 347 902   Basic Metabolic Panel: Recent Labs  Lab 12/26/21 2047 12/27/21 0118 12/27/21 0513 12/28/21 0519  NA 135  --  134* 136  K 3.9  --  4.1 3.8  CL 102  --  102 104  CO2 21*  --  23 25  GLUCOSE 107*  --  120* 125*  BUN 45*  --  53* 48*  CREATININE 2.17* 2.36* 2.36* 2.05*  CALCIUM 8.7*  --  8.3* 8.1*  MG  --  2.3  --   --   PHOS  --  4.3  --   --    GFR: Estimated Creatinine Clearance: 30.3 mL/min (A) (by C-G formula based on SCr of 2.05 mg/dL (H)). Liver Function Tests: Recent Labs  Lab 12/26/21 2047 12/27/21 0513 12/28/21 0519  AST 83* 68* 59*  ALT 37 34 34  ALKPHOS 304* 246* 251*  BILITOT 3.7* 3.3* 3.5*  PROT 7.5 6.4* 6.3*  ALBUMIN 2.4* 2.0* 1.8*   Recent Labs  Lab 12/26/21 2149  LIPASE 75*  No results for input(s): AMMONIA in the last 168 hours. Coagulation Profile: Recent Labs  Lab 12/27/21 0013 12/27/21 1239 12/28/21 0519  INR 7.8* 9.4* 5.2*   Cardiac Enzymes: No results for input(s): CKTOTAL, CKMB,  CKMBINDEX, TROPONINI in the last 168 hours. BNP (last 3 results) No results for input(s): PROBNP in the last 8760 hours. HbA1C: No results for input(s): HGBA1C in the last 72 hours. CBG: No results for input(s): GLUCAP in the last 168 hours. Lipid Profile: No results for input(s): CHOL, HDL, LDLCALC, TRIG, CHOLHDL, LDLDIRECT in the last 72 hours. Thyroid Function Tests: No results for input(s): TSH, T4TOTAL, FREET4, T3FREE, THYROIDAB in the last 72 hours. Anemia Panel: No results for input(s): VITAMINB12, FOLATE, FERRITIN, TIBC, IRON, RETICCTPCT in the last 72 hours. Sepsis Labs: Recent Labs  Lab 12/26/21 2121  LATICACIDVEN 1.5    Recent Results (from the past 240 hour(s))  Culture, blood (single)     Status: None (Preliminary result)   Collection Time: 12/27/21 12:15 AM   Specimen: BLOOD  Result Value Ref Range Status   Specimen Description   Final    BLOOD PORTA CATH Performed at [facility] [address] [place], [place] [address]    Special Requests   Final    BOTTLES DRAWN AEROBIC AND ANAEROBIC Blood Culture adequate volume Performed at [facility] [address] [place], [place] [address]    Culture   Final    NO GROWTH 1 DAY Performed at [facility], [place] [address], [place], [place] [address]    Report Status PENDING  Incomplete  Blood culture (routine x 2)     Status: None (Preliminary result)   Collection Time: 12/27/21 12:51 AM   Specimen: BLOOD  Result Value Ref Range Status   Specimen Description   Final    BLOOD RIGHT ANTECUBITAL Performed at [facility] [address], [place], [place] [address]    Special Requests   Final    BOTTLES DRAWN AEROBIC AND ANAEROBIC Blood Culture adequate volume Performed at [facility] [address], [place], [place] [address]    Culture   Final    NO GROWTH 1 DAY Performed at [facility], [place] [address]  [place], [place] [address]    Report Status PENDING  Incomplete  Resp Panel by RT-PCR (Flu A&B, Covid) Nasopharyngeal Swab     Status: None   Collection Time: 12/27/21  6:46 AM   Specimen: Nasopharyngeal Swab; Nasopharyngeal(NP) swabs in vial transport medium  Result Value Ref Range Status   SARS Coronavirus 2 by RT PCR NEGATIVE NEGATIVE Final    Comment: (NOTE) SARS-CoV-2 target nucleic acids are NOT DETECTED.  The SARS-CoV-2 RNA is generally detectable in upper respiratory specimens during the acute phase of infection. The lowest concentration of SARS-CoV-2 viral copies this assay can detect is 138 copies/mL. A negative result does not preclude SARS-Cov-2 infection and should not be used as the sole basis for treatment or other patient management decisions. A negative result may occur with  improper specimen collection/handling, submission of specimen other than nasopharyngeal swab, presence of viral mutation(s) within the areas targeted by this assay, and inadequate number of viral copies(<138 copies/mL). A negative result must be combined with clinical observations, patient history, and epidemiological information. The expected result is Negative.  Fact Sheet for Patients:  EntrepreneurPulse.com.au  Fact Sheet for Healthcare Providers:  IncredibleEmployment.be  This test is no t yet approved or cleared  by the [place] and  has been authorized for detection and/or diagnosis of SARS-CoV-2 by FDA under an Emergency Use Authorization (EUA). This EUA will remain  in effect (meaning this test can be used) for the duration of the COVID-19 declaration under Section 564(b)(1) of the Act, 21 U.S.C.section 360bbb-3(b)(1), unless the authorization is terminated  or revoked sooner.       Influenza A by PCR NEGATIVE NEGATIVE Final   Influenza B by PCR NEGATIVE NEGATIVE Final    Comment: (NOTE) The Xpert Xpress SARS-CoV-2/FLU/RSV plus assay is  intended as an aid in the diagnosis of influenza from Nasopharyngeal swab specimens and should not be used as a sole basis for treatment. Nasal washings and aspirates are unacceptable for Xpert Xpress SARS-CoV-2/FLU/RSV testing.  Fact Sheet for Patients: EntrepreneurPulse.com.au  Fact Sheet for Healthcare Providers: IncredibleEmployment.be  This test is not yet approved or cleared by the [place] FDA and has been authorized for detection and/or diagnosis of SARS-CoV-2 by FDA under an Emergency Use Authorization (EUA). This EUA will remain in effect (meaning this test can be used) for the duration of the COVID-19 declaration under Section 564(b)(1) of the Act, 21 U.S.C. section 360bbb-3(b)(1), unless the authorization is terminated or revoked.  Performed at [facility], [place] [address] [place],  [address]      Radiology Studies: CT ABDOMEN PELVIS WO CONTRAST  Result Date: 12/26/2021 CLINICAL DATA:  Prior history of lung, colon and breast cancer, presents today with abdominal pain and low back pain. EXAM: CT ABDOMEN AND PELVIS WITHOUT CONTRAST TECHNIQUE: Multidetector CT imaging of the abdomen and pelvis was performed following the standard protocol without IV contrast. COMPARISON:  There are numerous prior CTs. The 2 most recent are noncontrast CTs dated 11/12/2021 and 08/04/2021 FINDINGS: Lower chest: A subsolid posterior pleural-based right lower lobe pulmonary nodule measures 11.3 mm today, on 11/12/2021 measuring 9.5 mm with surrounding haziness. Lung bases show scattered scar-like opacities without further significant findings. There are postsurgical changes in the outer right breast and dystrophic calcifications in both breasts. Hepatobiliary: There has been interval stenting of the right and left intrahepatic main ducts, with the left sided stent extending down the CBD into the duodenum. In left lobe lateral segment  there is new demonstration of a sizable heterogeneous mass measuring 8.4 x 5.4 cm on series 1 axial 17 with scattered fluid pockets, largest individual fluid pocket approaching 4 cm and could represent a multilocular abscess or metastatic liver disease. Infectious process may be favored given that the last study was only about 6 weeks ago. There is additional new rounded low-density lesion more laterally within this segment measuring 3.0 cm and 9.6 Hounsfield units and a smaller low-density lesion more inferiorly in this segment measuring 1.2 cm and 16.2 Hounsfield units. There is still mild intrahepatic biliary dilatation in both lobes but it is less pronounced than previously. An ill-defined presumably conglomerate nodal mass at the porta hepatis today is estimated to measure 5.0 x 3.9 cm and is difficult to separate from the descending duodenum which it abuts at its posterior edge. Previously the mass measured about 4.6 x 2.9 cm. The right lobe demonstrated no focal mass, as visualized without the use of IV contrast. Gallbladder contains a single 2.5 cm stone is distended without wall thickening or pericholecystic edema. Pancreas: Today there is increased pancreatic edema with mild peripancreatic edema, findings consistent with likely interstitial pancreatitis. No pancreatic ductal dilatation is seen. Laboratory and clinical correlation suggested. No pancreatic  mass is seen without contrast. Spleen: Slightly prominent but otherwise unremarkable without contrast. Adrenals/Urinary Tract: There is no adrenal mass. Renal cysts are again noted, largest on the left measuring up to nearly 9 cm in the upper pole with 4.7 cm cyst anterior to this. There is a 3 mm nonobstructing caliceal stone in the inferior pole left kidney. Again noted is a right renal pelvis stone measuring 1.3 x 0.7 cm. There is a 1 cm low-density lesion of the posterior cortex of the right kidney measuring 45 Hounsfield units which has been noted on  multiple prior studies. There is no obstructing urinary stone or hydronephrosis. The bladder is contracted and not well seen. Stomach/Bowel: No dilatation or wall thickening including the appendix. Uncomplicated sigmoid diverticulosis. Vascular/Lymphatic: Conglomerate nodal mass described above in the porta hepatis. There is aortoiliac atherosclerosis without further visible adenopathy. Reproductive: The uterus is absent.  No adnexal mass is seen. Other: Small umbilical and inguinal fat hernias. No free air, hemorrhage or fluid. Musculoskeletal: Osteopenia and degenerative change of the spine. Slight lumbar levoscoliosis. No worrisome regional skeletal lesion. IMPRESSION: 1. 8.4 x 5.4 cm heterogeneous mass in the left lobe of the liver in the lateral segment with fluid pockets measuring up to nearly 4 cm. This could be a metastasis with areas of internal necrosis or an abscess with multiple locules. Infectious process is favored given the relatively short interval time frame of the appearance of this since the last study. 2. An additional 3.0 cm low-density lesion more laterally within the segment is noted as well as a 1.2 cm low-density lesion more inferiorly in this segment, as above could be metastases or due to infection. 3. Interval biliary stenting. There is still mild intrahepatic biliary prominence but less pronounced than previously. The gallbladder is distended and again contains a 2.5 cm stone but there is no appreciable wall thickening. 4. 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 No other adenopathy is visible. This does abut the superior aspect of the pancreatic head and neck junction but probably does not arise from the pancreas. Evaluation limited without IV contrast. 5. Evidence of acute interstitial pancreatitis. Laboratory and clinical correlation suggested. No peripancreatic fluid collections. 6. Bilateral nephrolithiasis and cysts. 7. Subsolid right lower lobe  pleural-based nodule slightly larger than previously. Electronically Signed   By: Telford Nab M.D.   On: 12/26/2021 22:36   CT L-SPINE NO CHARGE  Result Date: 12/26/2021 CLINICAL DATA:  Initial evaluation for acute lower back pain. EXAM: CT LUMBAR SPINE WITHOUT CONTRAST TECHNIQUE: Multidetector CT imaging of the lumbar spine was performed without intravenous contrast administration. Multiplanar CT image reconstructions were also generated. COMPARISON:  None available. FINDINGS: Segmentation: Standard. Lowest well-formed disc space labeled the L5-S1 level. Alignment: Mild levoscoliosis with apex at L2-3. Alignment otherwise normal with preservation of the normal lumbar lordosis. No listhesis. Vertebrae: Vertebral body height maintained without acute or chronic fracture. Visualized sacrum and pelvis intact. SI joints symmetric and normal. No worrisome lytic or blastic osseous lesions. Paraspinal and other soft tissues: Paraspinous soft tissues demonstrate no acute finding. Advanced aorto bi-iliac atherosclerotic disease noted. Probable large exophytic left renal cyst partially visualized. 4 mm nonobstructive left renal calculus noted. 1.3 cm stone positioned within the right renal pelvis with associated localized pelviectasis. No overt hydronephrosis. Additional note made of a approximate 1 cm exophytic hyperdense lesion extending from the posterior aspect of the interpolar right kidney, nonspecific, but likely a proteinaceous and/or hemorrhagic cyst. Disc levels:  T12-L1: Negative interspace. Mild facet hypertrophy. No stenosis. L1-2: Degenerative intervertebral disc space narrowing with disc desiccation and diffuse disc bulge, slightly eccentric to the left. Mild facet hypertrophy. No spinal stenosis. Foramina remain patent. L2-3: Mild intervertebral disc space narrowing with diffuse disc bulge and disc desiccation. Disc bulging slightly asymmetric to the right. Mild facet hypertrophy. Resultant mild canal  with mild right worse than left lateral recess stenosis. Mild right L2 foraminal narrowing. Left neural foramina remains patent. L3-4: Mild right eccentric disc bulge. Moderate right worse than left facet hypertrophy. Resultant mild canal with bilateral lateral recess stenosis, worse on the right. Mild right L3 foraminal narrowing. Left neural foramina remains patent. L4-5: Diffuse disc bulge, asymmetric to the right. Moderate facet and ligament flavum hypertrophy. Resultant moderate to severe canal with bilateral subarticular stenosis. Moderate left L4 foraminal narrowing. Right neural foramina remains patent. L5-S1: Minimal disc bulge. Moderate facet hypertrophy. No significant spinal stenosis. Foramina remain patent. IMPRESSION: 1. No acute abnormality within the lumbar spine. 2. Multilevel degenerative spondylosis and facet hypertrophy. Findings most pronounced at L4-5 where there is resultant resulting in moderate to severe canal with bilateral subarticular stenosis, with moderate left L4 foraminal narrowing. 3. Bilateral nephrolithiasis, with 1.3 cm stone positioned within the right renal pelvis. Associated localized pelviectasis without overt hydronephrosis. 4. Aortic Atherosclerosis (ICD10-I70.0). Electronically Signed   By: Jeannine Boga M.D.   On: 12/26/2021 22:36   DG Chest Port 1 View  Result Date: 12/28/2021 CLINICAL DATA:  Pt c/o left sided back pain, especially with movement, sob, weakness. EXAM: PORTABLE CHEST 1 VIEW COMPARISON:  09/16/2020 FINDINGS: Cardiac silhouette is borderline enlarged. No convincing mediastinal or hilar masses. Right anterior chest wall Port-A-Cath is stable, tip in the lower superior vena cava. Lungs demonstrate diffuse bilateral interstitial prominence. No lung consolidation. No convincing pleural effusion and no pneumothorax. Skeletal structures are grossly intact. IMPRESSION: 1. No acute cardiopulmonary disease. Electronically Signed   By: Lajean Manes M.D.    On: 12/28/2021 09:10     LOS: 1 day   Antonieta Pert, MD [facility]  12/28/2021, 11:41 AM

## 2021-12-28 NOTE — Progress Notes (Signed)
Per RN to hold off [facility] for type and screen.MD will assess in am

## 2021-12-28 NOTE — Progress Notes (Signed)
Received call the patient's family had arrived to the unit.  I met again with patient, her grandson, her roommate, and stepdaughter.  We discussed regarding clinical course including short-term problems such as liver abscess and coagulopathy as well as long-term problems such as metastatic cancer.  We reviewed imaging and labs together.  Discussed plan to continue with current interventions without escalating care.  Discussed that we would reassess tomorrow and if coagulopathy resolves, consider drain placement at that time.  We also discussed guarded prognosis and that plan would be for treatment of any symptoms as they arrive to ensure that she is not uncomfortable.  Family appreciative of information and updates.  Micheline Rough, MD [facility] [phone]

## 2021-12-28 NOTE — Progress Notes (Signed)
°   12/28/21 0831  Assess: MEWS Score  Temp 98 F (36.7 C)  BP 122/65  Pulse Rate (!) 119  Resp (!) 30  SpO2 99 %  Assess: MEWS Score  MEWS Temp 0  MEWS Systolic 0  MEWS Pulse 2  MEWS RR 2  MEWS LOC 0  MEWS Score 4  MEWS Score Color Red  Assess: if the MEWS score is Yellow or Red  Were vital signs taken at a resting state? Yes  Focused Assessment No change from prior assessment  Does the patient meet 2 or more of the SIRS criteria? Yes  Does the patient have a confirmed or suspected source of infection? Yes  Provider and Rapid Response Notified? Yes  MEWS guidelines implemented *See Row Information* Yes  Treat  MEWS Interventions Escalated (See documentation below)  Take Vital Signs  Increase Vital Sign Frequency  Red: Q 1hr X 4 then Q 4hr X 4, if remains red, continue Q 4hrs  Escalate  MEWS: Escalate Red: discuss with charge nurse/RN and provider, consider discussing with RRT  Notify: Charge Nurse/RN  Name of Charge Nurse/RN Notified Wayna Chalet RN  Date Charge Nurse/RN Notified 12/28/21  Time Charge Nurse/RN Notified [id number]  Notify: Provider  Provider Name/Title Ramesh  Date Provider Notified 12/28/21  Time Provider Notified [phone]  Notification Type Page  Notification Reason Other (Comment)  Notify: Rapid Response  Name of Rapid Response RN Notified Bailey RN  Date Rapid Response Notified 12/28/21  Time Rapid Response Notified [id number]  Assess: SIRS CRITERIA  SIRS Temperature  0  SIRS Pulse 1  SIRS Respirations  1  SIRS WBC 0  SIRS Score Sum  2   Elevated heart rate and respirations. Charge nurse Advertising copywriter notified, rapid response, and Antonieta Pert, MD notified. Will await MD orders and continue to monitor patient.

## 2021-12-28 NOTE — Progress Notes (Signed)
Initial Nutrition Assessment  DOCUMENTATION CODES:   Obesity unspecified  INTERVENTION:   -Boost Breeze po TID, each supplement provides 250 kcal and 9 grams of protein  NUTRITION DIAGNOSIS:   Increased nutrient needs related to cancer and cancer related treatments as evidenced by estimated needs.  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 fib, CKD stage IIIb hypertension, HLD, obesity recent hospitalization secondary to jaundice status post biliary stenting presents to the ED with 2 weeks of worsening abdominal pain in mid abdomen and radiating across bilateral to the back.  She had constipation to laxative wk ago ended up with diarrhea.  Patient in room, no family present at bedside. Pt seems lethargic, did not answer many of RD's questions. Mainly nodded her head 'yes' or 'no'. Could not elaborate. Pt was awaiting drain procedure in IR.  On clears currently, will order Boost Breeze.  Per GI, will likely need ERCP. Per  chart review, pt was having abdominal pain for 2 weeks. Pt nodded her yes that she still has an appetite.   Per weight records, pt has lost 12 lbs since 09/18/21 (4% wt loss x 3 months, insignificant for time frame).  Medications: Pepcid, Vitamin K, Lactated ringers  Labs reviewed.  NUTRITION - FOCUSED PHYSICAL EXAM:  No depletions noted.  Diet Order:   Diet Order             Diet clear liquid Room service appropriate? Yes; Fluid consistency: Thin  Diet effective now                   EDUCATION NEEDS:   No education needs have been identified at this time  Skin:  Skin  Assessment: Reviewed RN Assessment  Last BM:  PTA  Height:   Ht Readings from Last 1 Encounters:  12/27/21 5\' 10"  (1.778 m)    Weight:   Wt Readings from Last 1 Encounters:  12/28/21 116.7 kg    BMI:  Body mass index is 36.92 kg/m.  Estimated Nutritional Needs:   Kcal:  2000-2200  Protein:  100-110g  Fluid:  2L/day  Clayton Bibles, MS, RD, LDN Inpatient Clinical Dietitian Contact information available via Amion
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
## 2021-12-28 NOTE — Progress Notes (Signed)
Came to bedside to assess patient due to Red MEWS. See flowsheets for vitals. PT reports she is in pain, and bedside RN notified. Per Agricultural consultant, MD was just at bedside to also assess patient. No acute change in patient status.

## 2021-12-28 NOTE — Progress Notes (Signed)
Rapid response called at 1215 for concerns with respiratory status. When arrived in room, RNs at bedside. Shared earlier in the shift, RR was high and at this time patient experiencing periods of apnea.   Patient responds to voice and is alert to person. VS- 96% 2L [place], HR 80 (afib), BP 106/56.   Patient made DNR previously, bedside RN team to page palliative medicine and call family for an update.

## 2021-12-28 NOTE — Progress Notes (Signed)
Progress Note   Subjective  Pain is better control.    Objective  Vital signs in last 24 hours: Temp:  [97.9 F (36.6 C)-98.9 F (37.2 C)] 98 F (36.7 C) (01/02 0831) Pulse Rate:  [91-119] 107 (01/02 0911) Resp:  [12-30] 22 (01/02 0911) BP: (101-129)/(42-94) 107/55 (01/02 0911) SpO2:  [91 %-100 %] 99 % (01/02 0911) Weight:  [115.9 kg-116.7 kg] 116.7 kg (01/02 0500) Last BM Date:  (unable to remember)  General: Alert, well-developed, acutely ill appearing in NAD Heart:  Regular rate and rhythm; no murmurs Chest: Clear to ascultation bilaterally Abdomen:  Soft, epigastric tenderness and nondistended. Normal bowel sounds, without guarding, and without rebound.   Extremities:  Without edema. Neurologic:  Alert and  oriented x4; grossly normal neurologically. Psych:  Alert and cooperative. Normal mood and affect.  Intake/Output from previous day: 01/01 0701 - 01/02 0700 In: 1695 [I.V.:1584.1; IV Piggyback:100.8] Out: -  Intake/Output this shift: No intake/output data recorded.  Lab Results: Recent Labs    12/27/21 0118 12/27/21 0513 12/28/21 0519  WBC 17.7* 17.0* 15.7*  HGB 10.9* 10.0* 9.9*  HCT 34.6* 32.0* 31.9*  PLT 378 347 315   BMET Recent Labs    12/26/21 2047 12/27/21 0118 12/27/21 0513 12/28/21 0519  NA 135  --  134* 136  K 3.9  --  4.1 3.8  CL 102  --  102 104  CO2 21*  --  23 25  GLUCOSE 107*  --  120* 125*  BUN 45*  --  53* 48*  CREATININE 2.17* 2.36* 2.36* 2.05*  CALCIUM 8.7*  --  8.3* 8.1*   LFT Recent Labs    12/28/21 0519  PROT 6.3*  ALBUMIN 1.8*  AST 59*  ALT 34  ALKPHOS 251*  BILITOT 3.5*  BILIDIR 2.2*  IBILI 1.3*   PT/INR Recent Labs    12/27/21 1239 12/28/21 0519  LABPROT 76.2* 47.9*  INR 9.4* 5.2*    Studies/Results: CT ABDOMEN PELVIS WO CONTRAST  Result Date: 12/26/2021 CLINICAL DATA:  Prior history of lung, colon and breast cancer, presents today with abdominal pain and low back pain. EXAM: CT ABDOMEN AND  PELVIS WITHOUT CONTRAST TECHNIQUE: Multidetector CT imaging of the abdomen and pelvis was performed following the standard protocol without IV contrast. COMPARISON:  There are numerous prior CTs. The 2 most recent are noncontrast CTs dated 11/12/2021 and 08/04/2021 FINDINGS: Lower chest: A subsolid posterior pleural-based right lower lobe pulmonary nodule measures 11.3 mm today, on 11/12/2021 measuring 9.5 mm with surrounding haziness. Lung bases show scattered scar-like opacities without further significant findings. There are postsurgical changes in the outer right breast and dystrophic calcifications in both breasts. Hepatobiliary: There has been interval stenting of the right and left intrahepatic main ducts, with the left sided stent extending down the CBD into the duodenum. In left lobe lateral segment there is new demonstration of a sizable heterogeneous mass measuring 8.4 x 5.4 cm on series 1 axial 17 with scattered fluid pockets, largest individual fluid pocket approaching 4 cm and could represent a multilocular abscess or metastatic liver disease. Infectious process may be favored given that the last study was only about 6 weeks ago. There is additional new rounded low-density lesion more laterally within this segment measuring 3.0 cm and 9.6 Hounsfield units and a smaller low-density lesion more inferiorly in this segment measuring 1.2 cm and 16.2 Hounsfield units. There is still mild intrahepatic biliary dilatation in both lobes but it is less pronounced than previously.  An ill-defined presumably conglomerate nodal mass at the porta hepatis today is estimated to measure 5.0 x 3.9 cm and is difficult to separate from the descending duodenum which it abuts at its posterior edge. Previously the mass measured about 4.6 x 2.9 cm. The right lobe demonstrated no focal mass, as visualized without the use of IV contrast. Gallbladder contains a single 2.5 cm stone is distended without wall thickening or  pericholecystic edema. Pancreas: Today there is increased pancreatic edema with mild peripancreatic edema, findings consistent with likely interstitial pancreatitis. No pancreatic ductal dilatation is seen. Laboratory and clinical correlation suggested. No pancreatic mass is seen without contrast. Spleen: Slightly prominent but otherwise unremarkable without contrast. Adrenals/Urinary Tract: There is no adrenal mass. Renal cysts are again noted, largest on the left measuring up to nearly 9 cm in the upper pole with 4.7 cm cyst anterior to this. There is a 3 mm nonobstructing caliceal stone in the inferior pole left kidney. Again noted is a right renal pelvis stone measuring 1.3 x 0.7 cm. There is a 1 cm low-density lesion of the posterior cortex of the right kidney measuring 45 Hounsfield units which has been noted on multiple prior studies. There is no obstructing urinary stone or hydronephrosis. The bladder is contracted and not well seen. Stomach/Bowel: No dilatation or wall thickening including the appendix. Uncomplicated sigmoid diverticulosis. Vascular/Lymphatic: Conglomerate nodal mass described above in the porta hepatis. There is aortoiliac atherosclerosis without further visible adenopathy. Reproductive: The uterus is absent.  No adnexal mass is seen. Other: Small umbilical and inguinal fat hernias. No free air, hemorrhage or fluid. Musculoskeletal: Osteopenia and degenerative change of the spine. Slight lumbar levoscoliosis. No worrisome regional skeletal lesion. IMPRESSION: 1. 8.4 x 5.4 cm heterogeneous mass in the left lobe of the liver in the lateral segment with fluid pockets measuring up to nearly 4 cm. This could be a metastasis with areas of internal necrosis or an abscess with multiple locules. Infectious process is favored given the relatively short interval time frame of the appearance of this since the last study. 2. An additional 3.0 cm low-density lesion more laterally within the segment is  noted as well as a 1.2 cm low-density lesion more inferiorly in this segment, as above could be metastases or due to infection. 3. Interval biliary stenting. There is still mild intrahepatic biliary prominence but less pronounced than previously. The gallbladder is distended and again contains a 2.5 cm stone but there is no appreciable wall thickening. 4. 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 No other adenopathy is visible. This does abut the superior aspect of the pancreatic head and neck junction but probably does not arise from the pancreas. Evaluation limited without IV contrast. 5. Evidence of acute interstitial pancreatitis. Laboratory and clinical correlation suggested. No peripancreatic fluid collections. 6. Bilateral nephrolithiasis and cysts. 7. Subsolid right lower lobe pleural-based nodule slightly larger than previously. Electronically Signed   By: Telford Nab M.D.   On: 12/26/2021 22:36   CT L-SPINE NO CHARGE  Result Date: 12/26/2021 CLINICAL DATA:  Initial evaluation for acute lower back pain. EXAM: CT LUMBAR SPINE WITHOUT CONTRAST TECHNIQUE: Multidetector CT imaging of the lumbar spine was performed without intravenous contrast administration. Multiplanar CT image reconstructions were also generated. COMPARISON:  None available. FINDINGS: Segmentation: Standard. Lowest well-formed disc space labeled the L5-S1 level. Alignment: Mild levoscoliosis with apex at L2-3. Alignment otherwise normal with preservation of the normal lumbar lordosis. No listhesis. Vertebrae: Vertebral body  height maintained without acute or chronic fracture. Visualized sacrum and pelvis intact. SI joints symmetric and normal. No worrisome lytic or blastic osseous lesions. Paraspinal and other soft tissues: Paraspinous soft tissues demonstrate no acute finding. Advanced aorto bi-iliac atherosclerotic disease noted. Probable large exophytic left renal cyst partially visualized. 4 mm  nonobstructive left renal calculus noted. 1.3 cm stone positioned within the right renal pelvis with associated localized pelviectasis. No overt hydronephrosis. Additional note made of a approximate 1 cm exophytic hyperdense lesion extending from the posterior aspect of the interpolar right kidney, nonspecific, but likely a proteinaceous and/or hemorrhagic cyst. Disc levels: T12-L1: Negative interspace. Mild facet hypertrophy. No stenosis. L1-2: Degenerative intervertebral disc space narrowing with disc desiccation and diffuse disc bulge, slightly eccentric to the left. Mild facet hypertrophy. No spinal stenosis. Foramina remain patent. L2-3: Mild intervertebral disc space narrowing with diffuse disc bulge and disc desiccation. Disc bulging slightly asymmetric to the right. Mild facet hypertrophy. Resultant mild canal with mild right worse than left lateral recess stenosis. Mild right L2 foraminal narrowing. Left neural foramina remains patent. L3-4: Mild right eccentric disc bulge. Moderate right worse than left facet hypertrophy. Resultant mild canal with bilateral lateral recess stenosis, worse on the right. Mild right L3 foraminal narrowing. Left neural foramina remains patent. L4-5: Diffuse disc bulge, asymmetric to the right. Moderate facet and ligament flavum hypertrophy. Resultant moderate to severe canal with bilateral subarticular stenosis. Moderate left L4 foraminal narrowing. Right neural foramina remains patent. L5-S1: Minimal disc bulge. Moderate facet hypertrophy. No significant spinal stenosis. Foramina remain patent. IMPRESSION: 1. No acute abnormality within the lumbar spine. 2. Multilevel degenerative spondylosis and facet hypertrophy. Findings most pronounced at L4-5 where there is resultant resulting in moderate to severe canal with bilateral subarticular stenosis, with moderate left L4 foraminal narrowing. 3. Bilateral nephrolithiasis, with 1.3 cm stone positioned within the right renal pelvis.  Associated localized pelviectasis without overt hydronephrosis. 4. Aortic Atherosclerosis (ICD10-I70.0). Electronically Signed   By: Jeannine Boga M.D.   On: 12/26/2021 22:36   DG Chest Port 1 View  Result Date: 12/28/2021 CLINICAL DATA:  Pt c/o left sided back pain, especially with movement, sob, weakness. EXAM: PORTABLE CHEST 1 VIEW COMPARISON:  09/16/2020 FINDINGS: Cardiac silhouette is borderline enlarged. No convincing mediastinal or hilar masses. Right anterior chest wall Port-A-Cath is stable, tip in the lower superior vena cava. Lungs demonstrate diffuse bilateral interstitial prominence. No lung consolidation. No convincing pleural effusion and no pneumothorax. Skeletal structures are grossly intact. IMPRESSION: 1. No acute cardiopulmonary disease. Electronically Signed   By: Lajean Manes M.D.   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 8 cm x 5 cm heterogenous mass in the left lobe of the liver with additional pockets measuring nearly 4 cm  - mets with necrosis vs abscesses, favor abscesses.  IR following for abscess drainage when INR allows. Likely will need ERCP with plastic stent change, possible additional stents placed post IR drainage when INR allows.  Continue IV antibiotics.  Chronic Coumadin anticoagulation for afib with INR=5.2.    History of breast and lung cancer.  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 Fuller Plan MD 12/28/2021, 10:58 AM See Shea Evans, [facility], to contact our on call provider

## 2021-12-29 DIAGNOSIS — R1011 Right upper quadrant pain: Secondary | ICD-10-CM

## 2021-12-29 DIAGNOSIS — Z7901 Long term (current) use of anticoagulants: Secondary | ICD-10-CM

## 2021-12-29 DIAGNOSIS — Z515 Encounter for palliative care: Secondary | ICD-10-CM

## 2021-12-29 LAB — COMPREHENSIVE METABOLIC PANEL
ALT: 37 U/L (ref 0–44)
AST: 60 U/L — ABNORMAL HIGH (ref 15–41)
Albumin: 1.8 g/dL — ABNORMAL LOW (ref 3.5–5.0)
Alkaline Phosphatase: 259 U/L — ABNORMAL HIGH (ref 38–126)
Anion gap: 8 (ref 5–15)
BUN: 57 mg/dL — ABNORMAL HIGH (ref 8–23)
CO2: 26 mmol/L (ref 22–32)
Calcium: 8.3 mg/dL — ABNORMAL LOW (ref 8.9–10.3)
Chloride: 104 mmol/L (ref 98–111)
Creatinine, Ser: 2.31 mg/dL — ABNORMAL HIGH (ref 0.44–1.00)
GFR, Estimated: 21 mL/min — ABNORMAL LOW (ref >60)
Glucose, Bld: 112 mg/dL — ABNORMAL HIGH (ref 70–99)
Potassium: 4.3 mmol/L (ref 3.5–5.1)
Sodium: 138 mmol/L (ref 135–145)
Total Bilirubin: 3.8 mg/dL — ABNORMAL HIGH (ref 0.3–1.2)
Total Protein: 6.2 g/dL — ABNORMAL LOW (ref 6.5–8.1)

## 2021-12-29 LAB — PROTIME-INR
INR: 6.3 (ref 0.8–1.2)
Prothrombin Time: 55.5 seconds — ABNORMAL HIGH (ref 11.4–15.2)

## 2021-12-29 LAB — CBC
HCT: 34.4 % — ABNORMAL LOW (ref 36.0–46.0)
Hemoglobin: 10.5 g/dL — ABNORMAL LOW (ref 12.0–15.0)
MCH: 26.9 pg (ref 26.0–34.0)
MCHC: 30.5 g/dL (ref 30.0–36.0)
MCV: 88 fL (ref 80.0–100.0)
Platelets: 341 10*3/uL (ref 150–400)
RBC: 3.91 MIL/uL (ref 3.87–5.11)
RDW: 15.9 % — ABNORMAL HIGH (ref 11.5–15.5)
WBC: 13.4 10*3/uL — ABNORMAL HIGH (ref 4.0–10.5)
nRBC: 0 % (ref 0.0–0.2)

## 2021-12-29 MED ORDER — VITAMIN K1 10 MG/ML IJ SOLN
2.5000 mg | Freq: Once | INTRAMUSCULAR | Status: AC
  Administered 2021-12-29: 2.5 mg via INTRAVENOUS
  Filled 2021-12-29: qty 0.25

## 2021-12-29 NOTE — Progress Notes (Signed)
°   12/29/21 0454  Assess: MEWS Score  Temp 98.4 F (36.9 C)  BP (!) 100/59  Pulse Rate 96  Resp (!) 30  Level of Consciousness Alert  SpO2 96 %  O2 Device Nasal Cannula  O2 Flow Rate (L/min) 2 L/min  Assess: MEWS Score  MEWS Temp 0  MEWS Systolic 1  MEWS Pulse 0  MEWS RR 2  MEWS LOC 0  MEWS Score 3  MEWS Score Color Yellow  Assess: if the MEWS score is Yellow or Red  Were vital signs taken at a resting state? Yes  Focused Assessment No change from prior assessment  Does the patient meet 2 or more of the SIRS criteria? Yes  Does the patient have a confirmed or suspected source of infection? Yes  Provider and Rapid Response Notified? No  MEWS guidelines implemented *See Row Information* Yes  Treat  MEWS Interventions Escalated (See documentation below)  Pain Scale 0-10  Pain Score 0  Take Vital Signs  Increase Vital Sign Frequency  Yellow: Q 2hr X 2 then Q 4hr X 2, if remains yellow, continue Q 4hrs  Escalate  MEWS: Escalate Yellow: discuss with charge nurse/RN and consider discussing with provider and RRT  Notify: Charge Nurse/RN  Name of Charge Nurse/RN Notified Kathyrn Lass RN  Date Charge Nurse/RN Notified 12/29/21  Time Charge Nurse/RN Notified [phone]  Document  Progress note created (see row info) Yes  Assess: SIRS CRITERIA  SIRS Temperature  0  SIRS Pulse 1  SIRS Respirations  1  SIRS WBC 0  SIRS Score Sum  2

## 2021-12-29 NOTE — Progress Notes (Signed)
Daily Progress Note   Patient Name: Tammie [name]       Date: 12/29/2021 DOB: 1941/01/18  Age: 81 y.o. MRN#: [id number] Attending Physician: Antonieta Pert, MD Primary Care Physician: [name], FNP Admit Date: 12/26/2021  Reason for Consultation/Follow-up: Establishing goals of care  Subjective:  Resting in bed, sleeping peacefully, does not open eyes or arouse to gentle voice commands, in no distress.   Length of Stay: 2  Current Medications: Scheduled Meds:   sodium chloride   Intravenous Once   Chlorhexidine Gluconate Cloth  6 each Topical Daily   famotidine  20 mg Oral Daily   feeding supplement  1 Container Oral TID BM   feeding supplement  237 mL Oral BID BM   letrozole  2.5 mg Oral Daily   mouth rinse  15 mL Mouth Rinse BID   metoprolol tartrate  50 mg Oral BID   pantoprazole  40 mg Oral Daily   sodium chloride flush  10-40 mL Intracatheter Q12H   valACYclovir  500 mg Oral BID    Continuous Infusions:  sodium chloride 10 mL/hr at 12/28/21 0600   ampicillin-sulbactam (UNASYN) IV 3 g (12/29/21 0256)   lactated ringers 75 mL/hr at 12/28/21 2136    PRN Meds: sodium chloride, acetaminophen, albuterol, glycopyrrolate, HYDROcodone-acetaminophen, HYDROmorphone (DILAUDID) injection, LORazepam, metoprolol tartrate, ondansetron **OR** ondansetron (ZOFRAN) IV, polyethylene glycol, sodium chloride flush  Physical Exam         Resting in bed Regular work of breathing No edema In no distress  Vital Signs: BP (!) 104/58 (BP Location: Right Arm)    Pulse 97    Temp 97.8 F (36.6 C) (Oral)    Resp 18    Ht '5\' 10"'  (1.778 m)    Wt 119.5 kg    SpO2 97%    BMI 37.80 kg/m  SpO2: SpO2: 97 % O2 Device: O2 Device: Nasal Cannula O2 Flow Rate: O2 Flow Rate (L/min): 2  L/min  Intake/output summary:  Intake/Output Summary (Last 24 hours) at 12/29/2021 1232 Last data filed at 12/29/2021 0355 Gross per 24 hour  Intake 1340.87 ml  Output 400 ml  Net 940.87 ml   LBM: Last BM Date:  (UTA) Baseline Weight: Weight: 117.9 kg Most recent weight: Weight: 119.5 kg       Palliative Assessment/Data:  Patient Active Problem List   Diagnosis Date Noted   Abdominal pain 12/27/2021   Obstructive jaundice due to cancer ([place]) 11/12/2021   Hyperbilirubinemia    Hypokalemia    Jaundice    Transaminitis    AKI (acute kidney injury) ([place])    Supratherapeutic INR    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 ([place]) 09/22/2021   Cancer with unknown primary site [facility]) 09/22/2021   Encounter for therapeutic drug monitoring 08/06/2021   Warfarin anticoagulation 10/06/2020   Chronic diastolic CHF (congestive heart failure) ([place]) 09/14/2020   Acute respiratory failure with hypoxia ([place]) 10/15/2019   Port-A-Cath in place 05/18/2018   Genetic testing 07/13/2017   Primary adenocarcinoma of colon ([place]) 07/07/2017   Encounter for antineoplastic immunotherapy 12/17/2016   Liver mass 10/10/2016   Neuropathy 07/26/2016   Antineoplastic chemotherapy induced anemia 03/24/2015   CKD (chronic kidney disease), stage III ([place]) 03/24/2015   Hyperlipidemia with target LDL less than 100 03/10/2015   Peripheral edema 03/10/2015   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 ([place]) 08/29/2014   Malignant neoplasm of upper-inner quadrant of left breast in female, estrogen receptor positive ([place]) 11/13/2013   COPD with chronic bronchitis ([place]) 03/13/2012   HTN (hypertension) 03/13/2012   Atrial fibrillation ([place]) 03/08/2012   Asthma 04/17/2011   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   DOE (dyspnea on exertion) 09/04/2010   History of CVA (cerebrovascular accident) 05/21/2009    Palliative Care Assessment & Plan   Patient Profile:    Assessment:  Metastatic colon cancer to the  liver: With prior biliary obstruction requiring biliary stents, one uncovered SEMS and 1 plastic stent, placed in November 2022; 8 cm x 5 cm heterogenous mass in the left lobe of the liver with additional pockets measuring nearly 4 cm-mets with necrosis versus abscess (favor abscess); IR following for abscess drainage when INR allows 2.  Chronic Coumadin anticoagulation for A. fib: Currently with elevated INR 5.2--> 6.3 overnight: discussed with [facility] MD.  3.  History of breast and lung cancer 4.  COPD on home O2 at 2 L  Recommendations/Plan: As per initial PMT consult, Dr Domingo Cocking met with patient, her grandson, her roommate, and stepdaughter, plan is to continue current mode of care, monitor INR and consider drain placement, GI also following. Monitor hospital course and overall disease trajectory so as to be able to engage further in goals of care discussions going forward, no new PMT specific recommendations at this time.     Code Status:    Code Status Orders  (From admission, onward)           Start     Ordered   12/28/21 0855  Do not attempt resuscitation (DNR)  Continuous       Question Answer Comment  In the event of cardiac or respiratory ARREST Do not call a code blue   In the event of cardiac or respiratory ARREST Do not perform Intubation, CPR, defibrillation or ACLS   In the event of cardiac or respiratory ARREST Use medication by any route, position, wound care, and other measures to relive pain and suffering. May use oxygen, suction and manual treatment of airway obstruction as needed for comfort.      12/28/21 0854           Code Status History     Date Active Date Inactive Code Status Order ID Comments User Context   12/27/2021 0156 12/28/2021 0854 Full Code [id number]  Barton Dubois, MD ED   11/12/2021 2007  11/16/2021 1832 Full Code [id number]  Marcelyn Bruins, MD ED   09/14/2020 0427 09/16/2020 1814 Full Code [id number]  Vianne Bulls, MD ED   10/15/2019 2353  10/16/2019 2157 Full Code [id number]  Vianne Bulls, MD Inpatient   05/27/2017 2112 05/30/2017 1414 Full Code [id number]  Toy Baker, MD Inpatient   07/15/2014 1256 07/20/2014 1801 Full Code [id number]  Caryn Bee Inpatient      Advance Directive Documentation    Flowsheet Row Most Recent Value  Type of Advance Directive Healthcare Power of Attorney, Living will  Pre-existing out of facility DNR order (yellow form or pink MOST form) --  "MOST" Form in Place? --       Prognosis:  Unable to determine  Discharge Planning: To Be Determined  Care plan was discussed with  [facility] MD.   Thank you for allowing the Palliative Medicine Team to assist in the care of this patient.   Time In: 10 Time Out: 10.25 Total Time  25 Prolonged Time Billed  no       Greater than 50%  of this time was spent counseling and coordinating care related to the above assessment and plan.  Loistine Chance, MD  Please contact Palliative Medicine Team phone at [phone] for questions and concerns.

## 2021-12-29 NOTE — Progress Notes (Signed)
Progress Note   Subjective  Chief Complaint: Metastatic colon cancer to liver with prior biliary obstruction requiring biliary stents  At time of my interview this morning patient is asleep and does not really wake to gentle palpation of her abdomen no acute complaints overnight per nursing staff.   Objective   Vital signs in last 24 hours: Temp:  [97.4 F (36.3 C)-98.8 F (37.1 C)] 97.8 F (36.6 C) (01/03 0909) Pulse Rate:  [87-100] 97 (01/03 0909) Resp:  [14-30] 18 (01/03 0648) BP: (92-119)/(51-68) 104/58 (01/03 0909) SpO2:  [95 %-99 %] 97 % (01/03 0909) Weight:  [119.5 kg] 119.5 kg (01/03 0119) Last BM Date:  (unable to remember) General:    Elderly white female in NAD-asleep Heart:  Regular rate and rhythm; no murmurs Lungs: Respirations even and unlabored, lungs CTA bilaterally Abdomen:  Soft, nontender and nondistended. Normal bowel sounds. Psych:  Asleep  Intake/Output from previous day: 01/02 0701 - 01/03 0700 In: 1340.9 [P.O.:25; I.V.:1163.7; IV Piggyback:152.1] Out: 400 [Urine:400]  Lab Results: Recent Labs    12/27/21 0513 12/28/21 0519 12/29/21 0415  WBC 17.0* 15.7* 13.4*  HGB 10.0* 9.9* 10.5*  HCT 32.0* 31.9* 34.4*  PLT 347 315 341   BMET Recent Labs    12/27/21 0513 12/28/21 0519 12/29/21 0415  NA 134* 136 138  K 4.1 3.8 4.3  CL 102 104 104  CO2 23 25 26   GLUCOSE 120* 125* 112*  BUN 53* 48* 57*  CREATININE 2.36* 2.05* 2.31*  CALCIUM 8.3* 8.1* 8.3*   LFT Recent Labs    12/28/21 0519 12/29/21 0415  PROT 6.3* 6.2*  ALBUMIN 1.8* 1.8*  AST 59* 60*  ALT 34 37  ALKPHOS 251* 259*  BILITOT 3.5* 3.8*  BILIDIR 2.2*  --   IBILI 1.3*  --    PT/INR Recent Labs    12/28/21 0519 12/29/21 0415  LABPROT 47.9* 55.5*  INR 5.2* 6.3*    Studies/Results: DG Chest Port 1 View  Result Date: 12/28/2021 CLINICAL DATA:  Pt c/o left sided back pain, especially with movement, sob, weakness. EXAM: PORTABLE CHEST 1 VIEW COMPARISON:  09/16/2020  FINDINGS: Cardiac silhouette is borderline enlarged. No convincing mediastinal or hilar masses. Right anterior chest wall Port-A-Cath is stable, tip in the lower superior vena cava. Lungs demonstrate diffuse bilateral interstitial prominence. No lung consolidation. No convincing pleural effusion and no pneumothorax. Skeletal structures are grossly intact. IMPRESSION: 1. No acute cardiopulmonary disease. Electronically Signed   By: Lajean Manes M.D.   On: 12/28/2021 09:10     Assessment / Plan:   Assessment: 1.  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  Chronic Coumadin anticoagulation for A. fib: Currently with elevated INR 5.2--> 6.3 overnight 3.  History of breast and lung cancer 4.  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  Appreciate IR's recommendations in regards to abscess drainage 2.  Again patient will likely need ERCP with plastic stent exchange and possible additional stents placed post IR drainage (when INR allows) 3.  Agree with antibiotics 4.  Continue current clear liquid diet  Thank you for your kind consultation, we will continue to follow.   LOS: 2  days   Levin Erp  12/29/2021, 10:26 AM

## 2021-12-29 NOTE — Progress Notes (Signed)
PROGRESS NOTE    Tammie [name]  OAC:[id number] DOB: May 11, 1941 DOA: 12/26/2021 PCP: [name], FNP   Chief Complaint  Patient presents with   Back Pain  Brief Narrative/Hospital Course: Tammie [name], 81 y.o. 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 fib, CKD stage IIIb hypertension, HLD, obesity recent hospitalization secondary to jaundice status post biliary stenting presents to the ED with 2 weeks of worsening abdominal pain in mid abdomen and radiating across bilateral to the back.  She had constipation to laxative wk ago ended up with diarrhea. At baseline uses cane lives with a friend after she was told she only has 2 years to live about 5 years ago. In the ED CT lumbar CT abdomen pelvis was done that showed new liver mass progressive metastatic lesion with necrosis versus abscess, and changes of acute pancreatitis, placed on IV fluids antibiotics GI was consulted and admitted.  She had supratherapeutic INR in 9.   Subjective: Seen and examined this morning patient is lethargic and sleepy. Received Dilaudid around 6 this morning. Had a family meeting yesterday given patient's tenuous status with malignancy and ongoing other issues-decided to continue with current intervention without escalating care. Overnight afebrile intermittently tachypneic and BP soft  Assessment & Plan:  Abdominal pain Nausea/vomiting Acute pancreatitis New liver mass concerning for progressive mets with necrosis versus abscess( more likely)  Sepsis POA Patient does meet sepsis criteria with leukocytosis tachycardia and a potential source liver abscess.  Leukocytosis downtrending, remains on IV Unasyn.  Continue pain control analgesic IV fluids.  GI and IR on board with plan for liver biopsy/ERCP with stent placement if coagulopathy resolves and patient remained stable.   Reversing Coumadin with vitamin K-if patient/family agrees we will do FFP's-grandson will check with Bobby patient's friend/husband.  Recent Labs  Lab 12/26/21 2047 12/26/21 2121 12/27/21 0118 12/27/21 0513 12/28/21 0519 12/29/21 0415  WBC 16.1*  --  17.7* 17.0* 15.7* 13.4*  LATICACIDVEN  --  1.5  --   --   --   --    Metastatic colon cancer to the liver with prior biliary obstruction requiring biliary stents-10/2021.  MRI now shows 8 x 5 cm heterogeneous mass in the left lobe of the liver with additional pockets nearly 4 cm mentioned necrosis versus abscesses, followed by GI closely.  Chronic anemia/anemia of chronic disease/anemia of malignancy: Some drop in hemoglobin? from hemodilution. No signs of acute blood loss.Monitor Recent Labs  Lab 12/26/21 2047 12/27/21 0118 12/27/21 0513 12/28/21 0519 12/29/21 0415  HGB 12.1 10.9* 10.0* 9.9* 10.5*  HCT 38.7 34.6* 32.0* 31.9* 34.4*    Chronic A. Fib with RVR: Rate improved after resuming home metoprolol, INR supratherapeutic   Supratherapeutic INR/coagulopathy: suspect multifactorial from Coumadin wondering if it is from liver dysfunction due to patient's liver metastasis: Coumadin on hold and is being reversed for possible procedure.  INR up at 6.3-we will do vitamin K 2.5 mg IV, we will consider FFP. Recent Labs  Lab 12/27/21 0013 12/27/21 1239 12/28/21 0519 12/29/21 0415  INR 7.8* 9.4* 5.2* 6.3*    GERD: Continue PPI  Chronic hypoxic respiratory failure:Cont supplemental oxygen provide bronchodilators as needed, incentive spirometry.  HTN:BP soft, on metoprolol for A. fib, amlodipine and on hold.  On IV fluids  Hypoalbuminemia: Augment diet Nutrition Problem: Increased nutrient needs Etiology: cancer and cancer related treatments Signs/Symptoms: estimated needs  Interventions: Boost Breeze, [employer] (each supplement provides 350kcal and 20 grams of protein)   Goals of care patient with metastatic cancer disease,  poor baseline functional status and overall poor prognosis.  Palliative care is consulted, she is a DNR after discussion.  Family meeting held yesterday by palliative care team and appreciate input, continue current plan of care with no escalation and procedure if coagulopathy resolves and pt is stable-see palliative care detailed note. Patient POA - friend Mortimer Fries and Yolanda Bonine.  They are aware about patient's overall poor prognosis, at risk of decompensation.    Class II Obesity:Patient's Body mass index is 37.8 kg/m.:Will benefit with PCP follow-up, weight loss  healthy lifestyle and outpatient sleep evaluation.  DVT prophylaxis: Place and maintain sequential compression device Start: 12/28/21 1155inr>1.5, scd Code Status:   Code Status: DNR Family Communication: plan of care discussed with patient.Family have been updated.  I had spoken with Mortimer Fries who patient lives with.  Discussed with grandson who will check with patient and family and lets u know if he wants to proceed with FFPs  Status is: Inpatient Remains inpatient appropriate because: Ongoing management of liver abscess versus necrosis, deconditioning . Disposition: Currently not medically stable for discharge. Anticipated Disposition: TBD  Objective: Vitals last 24 hrs: Vitals:   12/29/21 0536 12/29/21 0602 12/29/21 0648 12/29/21 0909  BP:  (!) 112/52  (!) 104/58  Pulse:  100  97  Resp: (!) 23 (!) 22 18   Temp:  98.8 F (37.1 C)  97.8 F (36.6 C)  TempSrc:  Axillary  Oral  SpO2:  95%  97%  Weight:      Height:       Weight change: 3.6 kg  Intake/Output Summary (Last 24 hours) at 12/29/2021 1151 Last data filed at 12/29/2021 0355 Gross per 24 hour  Intake 1340.87 ml  Output 400 ml  Net 940.87 ml   Net IO Since Admission: 2,835.84 mL [12/29/21 1151]   Physical Examination: General exam: Lethargic sleepy HEENT:Oral mucosa moist, Ear/Nose WNL grossly, dentition normal. Respiratory system: bilaterally diminished, no use  of accessory muscle Cardiovascular system: S1 & S2 +, No JVD,. Gastrointestinal system: Abdomen soft, tenderness more on the right upper quadrant, obese, BS+ Nervous System:Alert, awake, moving extremities and grossly nonfocal Extremities: No edema, distal peripheral pulses palpable.  Skin: No rashes,no icterus. MSK: Normal muscle bulk,tone, power   Medications reviewed:  Scheduled Meds:  sodium chloride   Intravenous Once   Chlorhexidine Gluconate Cloth  6 each Topical Daily   famotidine  20 mg Oral Daily   feeding supplement  1 Container Oral TID BM   feeding supplement  237 mL Oral BID BM   letrozole  2.5 mg Oral Daily   mouth rinse  15 mL Mouth Rinse BID   metoprolol tartrate  50 mg Oral BID   pantoprazole  40 mg Oral Daily   sodium chloride flush  10-40 mL Intracatheter Q12H   valACYclovir  500 mg Oral BID   Continuous Infusions:  sodium chloride 10 mL/hr at 12/28/21 0600   ampicillin-sulbactam (UNASYN) IV 3 g (12/29/21 0256)   lactated ringers 75 mL/hr at 12/28/21 2136    Diet Order             Diet clear liquid Room service appropriate? Yes; Fluid consistency: Thin  Diet effective now                   Nutrition Problem: Increased nutrient needs Etiology: cancer and cancer related  treatments Signs/Symptoms: estimated needs Interventions: Boost Breeze, Ensure Enlive (each supplement provides 350kcal and 20 grams of protein)  Weight change: 3.6 kg  Wt Readings from Last 3 Encounters:  12/29/21 119.5 kg  12/17/21 115.8 kg  12/07/21 117.9 kg     Consultants:see note  Procedures:see note Antimicrobials: Anti-infectives (From admission, onward)    Start     Dose/Rate Route Frequency Ordered Stop   12/27/21 0200  Ampicillin-Sulbactam (UNASYN) 3 g in sodium chloride 0.9 % 100 mL IVPB        3 g 200 mL/hr over 30 Minutes Intravenous Every 12 hours 12/27/21 0132     12/27/21 0145  valACYclovir (VALTREX) tablet 500 mg        500 mg Oral 2 times daily 12/27/21  0144     12/27/21 0030  ceFEPIme (MAXIPIME) 2 g in sodium chloride 0.9 % 100 mL IVPB  Status:  Discontinued        2 g 200 mL/hr over 30 Minutes Intravenous  Once 12/27/21 0018 12/27/21 0250   12/27/21 0030  metroNIDAZOLE (FLAGYL) IVPB 500 mg  Status:  Discontinued        500 mg 100 mL/hr over 60 Minutes Intravenous  Once 12/27/21 0018 12/27/21 0336      Culture/Microbiology    Component Value Date/Time   SDES  12/27/2021 0051    BLOOD RIGHT ANTECUBITAL Performed at [facility], [place] [address] [place], [place] [address]    SPECREQUEST  12/27/2021 0051    BOTTLES DRAWN AEROBIC AND ANAEROBIC Blood Culture adequate volume Performed at [facility] [address] [place], [place] [address]    CULT  12/27/2021 0051    NO GROWTH 2 DAYS Performed at [facility], [place] [address], [place], [place] [address]    REPTSTATUS PENDING 12/27/2021 3903    Other culture-see note  Unresulted Labs (From admission, onward)     Start     Ordered   12/29/21 0500  Comprehensive metabolic panel  Daily,   R     Question:  Specimen collection method  Answer:  Unit=Unit collect   12/28/21 1417   12/28/21 1205  Type and screen [place]  Once,   R       Comments: [facility]    12/28/21 1209   12/28/21 0500  Protime-INR  Daily,   R      12/27/21 0950   12/28/21 0500  CBC  Daily,   R     Question:  Specimen collection method  Answer:  Lab=Lab collect   12/27/21 1103   12/27/21 0051  Blood culture (routine x 2)  BLOOD CULTURE X 2,   STAT      12/27/21 0050          Data Reviewed: I have personally reviewed following labs and imaging studies CBC: Recent Labs  Lab 12/26/21 2047 12/27/21 0118 12/27/21 0513 12/28/21 0519 12/29/21 0415  WBC 16.1* 17.7* 17.0* 15.7* 13.4*  NEUTROABS 14.7*  --   --   --   --   HGB 12.1 10.9* 10.0* 9.9* 10.5*  HCT 38.7 34.6* 32.0* 31.9* 34.4*  MCV 84.5 85.4 87.0 87.2 88.0   PLT 395 378 347 315 009   Basic Metabolic Panel: Recent Labs  Lab 12/26/21 2047 12/27/21 0118 12/27/21 0513 12/28/21 0519 12/29/21 0415  NA 135  --  134* 136 138  K 3.9  --  4.1 3.8 4.3  CL 102  --  102 104 104  CO2 21*  --  23 25 26   GLUCOSE 107*  --  120* 125* 112*  BUN 45*  --  53* 48* 57*  CREATININE 2.17* 2.36* 2.36* 2.05* 2.31*  CALCIUM 8.7*  --  8.3* 8.1* 8.3*  MG  --  2.3  --   --   --   PHOS  --  4.3  --   --   --    GFR: Estimated Creatinine Clearance: 27.3 mL/min (A) (by C-G formula based on SCr of 2.31 mg/dL (H)). Liver Function Tests: Recent Labs  Lab 12/26/21 2047 12/27/21 0513 12/28/21 0519 12/29/21 0415  AST 83* 68* 59* 60*  ALT 37 34 34 37  ALKPHOS 304* 246* 251* 259*  BILITOT 3.7* 3.3* 3.5* 3.8*  PROT 7.5 6.4* 6.3* 6.2*  ALBUMIN 2.4* 2.0* 1.8* 1.8*   Recent Labs  Lab 12/26/21 2149  LIPASE 75*   No results for input(s): AMMONIA in the last 168 hours. Coagulation Profile: Recent Labs  Lab 12/27/21 0013 12/27/21 1239 12/28/21 0519 12/29/21 0415  INR 7.8* 9.4* 5.2* 6.3*   Cardiac Enzymes: No results for input(s): CKTOTAL, CKMB, CKMBINDEX, TROPONINI in the last 168 hours. BNP (last 3 results) No results for input(s): PROBNP in the last 8760 hours. HbA1C: No results for input(s): HGBA1C in the last 72 hours. CBG: Recent Labs  Lab 12/28/21 1214  GLUCAP 114*   Lipid Profile: No results for input(s): CHOL, HDL, LDLCALC, TRIG, CHOLHDL, LDLDIRECT in the last 72 hours. Thyroid Function Tests: No results for input(s): TSH, T4TOTAL, FREET4, T3FREE, THYROIDAB in the last 72 hours. Anemia Panel: No results for input(s): VITAMINB12, FOLATE, FERRITIN, TIBC, IRON, RETICCTPCT in the last 72 hours. Sepsis Labs: Recent Labs  Lab 12/26/21 2121  LATICACIDVEN 1.5    Recent Results (from the past 240 hour(s))  Culture, blood (single)     Status: None (Preliminary result)   Collection Time: 12/27/21 12:15 AM   Specimen: BLOOD  Result Value Ref  Range Status   Specimen Description   Final    BLOOD PORTA CATH Performed at [facility] [address] [place], [place] [address]    Special Requests   Final    BOTTLES DRAWN AEROBIC AND ANAEROBIC Blood Culture adequate volume Performed at [facility] [address], [place], [place] [address]    Culture  Setup Time NO ORGANISMS SEEN ANAEROBIC BOTTLE ONLY   Final   Culture   Final    NO GROWTH 2 DAYS Performed at [facility], [place] [address] [place], [place] [address]    Report Status PENDING  Incomplete  Blood culture (routine x 2)     Status: None (Preliminary result)   Collection Time: 12/27/21 12:51 AM   Specimen: BLOOD  Result Value Ref Range Status   Specimen Description   Final    BLOOD RIGHT ANTECUBITAL Performed at [facility] [address], [place], [place] [address]    Special Requests   Final    BOTTLES DRAWN AEROBIC AND ANAEROBIC Blood Culture adequate volume Performed at [facility] [address] [place], [place] [address]    Culture   Final    NO GROWTH 2 DAYS Performed at [facility] [address] [place],  [address]    Report Status PENDING  Incomplete  Resp Panel by RT-PCR (Flu A&B, Covid) Nasopharyngeal Swab     Status: None   Collection Time: 12/27/21  6:46 AM   Specimen: Nasopharyngeal Swab; Nasopharyngeal(NP) swabs in vial transport medium  Result Value Ref Range Status   SARS Coronavirus 2 by RT PCR NEGATIVE NEGATIVE Final    Comment: (NOTE) SARS-CoV-2 target nucleic acids are NOT DETECTED.  The SARS-CoV-2 RNA is generally detectable in upper respiratory specimens during the acute phase of infection. The lowest concentration of SARS-CoV-2 viral copies this assay can detect is 138 copies/mL. A negative result does not preclude SARS-Cov-2 infection and should not be used as the sole basis for treatment or other patient management  decisions. A negative result may occur with  improper specimen collection/handling, submission of specimen other than nasopharyngeal swab, presence of viral mutation(s) within the areas targeted by this assay, and inadequate number of viral copies(<138 copies/mL). A negative result must be combined with clinical observations, patient history, and epidemiological information. The expected result is Negative.  Fact Sheet for Patients:  EntrepreneurPulse.com.au  Fact Sheet for Healthcare Providers:  IncredibleEmployment.be  This test is no t yet approved or cleared by the [place] FDA and  has been authorized for detection and/or diagnosis of SARS-CoV-2 by FDA under an Emergency Use Authorization (EUA). This EUA will remain  in effect (meaning this test can be used) for the duration of the COVID-19 declaration under Section 564(b)(1) of the Act, 21 U.S.C.section 360bbb-3(b)(1), unless the authorization is terminated  or revoked sooner.       Influenza A by PCR NEGATIVE NEGATIVE Final   Influenza B by PCR NEGATIVE NEGATIVE Final    Comment: (NOTE) The Xpert Xpress SARS-CoV-2/FLU/RSV plus assay is intended as an aid in the diagnosis of influenza from Nasopharyngeal swab specimens and should not be used as a sole basis for treatment. Nasal washings and aspirates are unacceptable for Xpert Xpress SARS-CoV-2/FLU/RSV testing.  Fact Sheet for Patients: EntrepreneurPulse.com.au  Fact Sheet for Healthcare Providers: IncredibleEmployment.be  This test is not yet approved or cleared by the [place] FDA and has been authorized for detection and/or diagnosis of SARS-CoV-2 by FDA under an Emergency Use Authorization (EUA). This EUA will remain in effect (meaning this test can be used) for the duration of the COVID-19 declaration under Section 564(b)(1) of the Act, 21 U.S.C. section 360bbb-3(b)(1), unless the  authorization is terminated or revoked.  Performed at [facility], [place] [address], [place], [place] [address]      Radiology Studies: [facility] Chest Port 1 View  Result Date: 12/28/2021 CLINICAL DATA:  Pt c/o left sided back pain, especially with movement, sob, weakness. EXAM: PORTABLE CHEST 1 VIEW COMPARISON:  09/16/2020 FINDINGS: Cardiac silhouette is borderline enlarged. No convincing mediastinal or hilar masses. Right anterior chest wall Port-A-Cath is stable, tip in the lower superior vena cava. Lungs demonstrate diffuse bilateral interstitial prominence. No lung consolidation. No convincing pleural effusion and no pneumothorax. Skeletal structures are grossly intact. IMPRESSION: 1. No acute cardiopulmonary disease. Electronically Signed   By: Lajean Manes M.D.   On: 12/28/2021 09:10     LOS: 2 days   Antonieta Pert, MD [facility]  12/29/2021, 11:51 AM

## 2021-12-30 ENCOUNTER — Inpatient Hospital Stay (HOSPITAL_COMMUNITY): Payer: HMO

## 2021-12-30 LAB — COMPREHENSIVE METABOLIC PANEL
ALT: 31 U/L (ref 0–44)
AST: 50 U/L — ABNORMAL HIGH (ref 15–41)
Albumin: 1.7 g/dL — ABNORMAL LOW (ref 3.5–5.0)
Alkaline Phosphatase: 217 U/L — ABNORMAL HIGH (ref 38–126)
Anion gap: 9 (ref 5–15)
BUN: 65 mg/dL — ABNORMAL HIGH (ref 8–23)
CO2: 27 mmol/L (ref 22–32)
Calcium: 8.4 mg/dL — ABNORMAL LOW (ref 8.9–10.3)
Chloride: 105 mmol/L (ref 98–111)
Creatinine, Ser: 2.3 mg/dL — ABNORMAL HIGH (ref 0.44–1.00)
GFR, Estimated: 21 mL/min — ABNORMAL LOW (ref >60)
Glucose, Bld: 122 mg/dL — ABNORMAL HIGH (ref 70–99)
Potassium: 4.3 mmol/L (ref 3.5–5.1)
Sodium: 141 mmol/L (ref 135–145)
Total Bilirubin: 4.6 mg/dL — ABNORMAL HIGH (ref 0.3–1.2)
Total Protein: 6.4 g/dL — ABNORMAL LOW (ref 6.5–8.1)

## 2021-12-30 LAB — CBC
HCT: 34.8 % — ABNORMAL LOW (ref 36.0–46.0)
Hemoglobin: 10.3 g/dL — ABNORMAL LOW (ref 12.0–15.0)
MCH: 25.9 pg — ABNORMAL LOW (ref 26.0–34.0)
MCHC: 29.6 g/dL — ABNORMAL LOW (ref 30.0–36.0)
MCV: 87.7 fL (ref 80.0–100.0)
Platelets: 363 10*3/uL (ref 150–400)
RBC: 3.97 MIL/uL (ref 3.87–5.11)
RDW: 15.9 % — ABNORMAL HIGH (ref 11.5–15.5)
WBC: 13.6 10*3/uL — ABNORMAL HIGH (ref 4.0–10.5)
nRBC: 0 % (ref 0.0–0.2)

## 2021-12-30 LAB — PROTIME-INR
INR: 2.1 — ABNORMAL HIGH (ref 0.8–1.2)
Prothrombin Time: 23.5 seconds — ABNORMAL HIGH (ref 11.4–15.2)

## 2021-12-30 MED ORDER — MIDAZOLAM HCL 2 MG/2ML IJ SOLN
INTRAMUSCULAR | Status: DC | PRN
Start: 2021-12-30 — End: 2021-12-30
  Administered 2021-12-30: .5 mg via INTRAVENOUS

## 2021-12-30 MED ORDER — FENTANYL CITRATE (PF) 100 MCG/2ML IJ SOLN
INTRAMUSCULAR | Status: AC
  Filled 2021-12-30: qty 4

## 2021-12-30 MED ORDER — FENTANYL CITRATE (PF) 100 MCG/2ML IJ SOLN
INTRAMUSCULAR | Status: AC | PRN
Start: 2021-12-30 — End: 2021-12-30
  Administered 2021-12-30: 25 ug via INTRAVENOUS

## 2021-12-30 MED ORDER — SODIUM CHLORIDE 0.9% FLUSH
5.0000 mL | Freq: Three times a day (TID) | INTRAVENOUS | Status: DC
  Administered 2021-12-30 – 2022-01-05 (×18): 5 mL

## 2021-12-30 MED ORDER — DEXTROSE 5 % IV SOLN
10.0000 mg | Freq: Once | INTRAVENOUS | Status: DC
  Filled 2021-12-30: qty 1

## 2021-12-30 MED ORDER — MIDAZOLAM HCL 2 MG/2ML IJ SOLN
INTRAMUSCULAR | Status: AC
  Filled 2021-12-30: qty 4

## 2021-12-30 MED ORDER — FLUMAZENIL 0.5 MG/5ML IV SOLN
INTRAVENOUS | Status: AC
  Filled 2021-12-30: qty 5

## 2021-12-30 MED ORDER — MIDAZOLAM HCL 2 MG/2ML IJ SOLN
INTRAMUSCULAR | Status: AC | PRN
  Administered 2021-12-30: .5 mg via INTRAVENOUS

## 2021-12-30 MED ORDER — NALOXONE HCL 0.4 MG/ML IJ SOLN
0.4000 mg | Freq: Once | INTRAMUSCULAR | Status: AC
  Filled 2021-12-30: qty 1

## 2021-12-30 MED ORDER — LIDOCAINE HCL (PF) 1 % IJ SOLN
INTRAMUSCULAR | Status: AC | PRN
  Administered 2021-12-30: 10 mL via INTRADERMAL

## 2021-12-30 MED ORDER — HYDROCODONE-ACETAMINOPHEN 5-325 MG PO TABS
1.0000 | ORAL_TABLET | ORAL | Status: DC | PRN
  Administered 2021-12-30: 2 via ORAL
  Filled 2021-12-30: qty 2

## 2021-12-30 MED ORDER — FENTANYL CITRATE (PF) 100 MCG/2ML IJ SOLN
INTRAMUSCULAR | Status: AC | PRN
  Administered 2021-12-30: 25 ug via INTRAVENOUS

## 2021-12-30 MED ORDER — NALOXONE HCL 0.4 MG/ML IJ SOLN
INTRAMUSCULAR | Status: AC
  Administered 2021-12-31: 0.4 mg via INTRAVENOUS
  Filled 2021-12-30: qty 1

## 2021-12-30 MED ORDER — NALOXONE HCL 0.4 MG/ML IJ SOLN
INTRAMUSCULAR | Status: AC
  Filled 2021-12-30: qty 1

## 2021-12-30 MED ORDER — NALOXONE HCL 0.4 MG/ML IJ SOLN
0.4000 mg | INTRAMUSCULAR | Status: DC | PRN
  Administered 2021-12-30: 0.4 mg via INTRAVENOUS

## 2021-12-30 MED ORDER — FENTANYL CITRATE (PF) 100 MCG/2ML IJ SOLN
INTRAMUSCULAR | Status: DC | PRN
  Administered 2021-12-30: 25 ug via INTRAVENOUS

## 2021-12-30 NOTE — Progress Notes (Signed)
Reevaluated after issue with unresponsiveness and seems more coherent after Narcan I confirmed with family at the bedside patient is DNR We will follow as needed tonight but will review comprehensively in a.m.  Verneita Griffes, MD [facility] 5:16 PM

## 2021-12-30 NOTE — Progress Notes (Signed)
PHARMACY - PHYSICIAN COMMUNICATION CRITICAL VALUE ALERT - BLOOD CULTURE IDENTIFICATION (BCID)  Tammie [name] is an 81 y.o. female who presented to [facility] on 12/26/2021 with a chief complaint of abd pain, back pain, N/V  Assessment:  GPV in 1/4 anaerobic. BCID = none detected. labs suspects contaminant, poss. diptheroids but can't report out as GPV. Expect to report out tomorrow.   Name of physician (or Provider) Contacted: Samtani via Secure Chat  Current antibiotics: Unasyn 3 mg IV q12 for r/o liver abscess  Changes to prescribed antibiotics recommended:  Rec to continue current Unasyn for r/o liver absess & await ID of [place], Pharm.D 12/30/2021 1:36 PM

## 2021-12-30 NOTE — Procedures (Signed)
°  Procedure: CT liver abscess drain catheter placement 57f EBL:   minimal Complications:  none immediate  See full dictation in [facility].  Dillard Cannon MD Main # [phone] Pager  [phone] Mobile [phone]

## 2021-12-30 NOTE — Plan of Care (Signed)
°  Problem: Activity: Goal: Risk for activity intolerance will decrease Outcome: Not Progressing   Problem: Nutrition: Goal: Adequate nutrition will be maintained Outcome: Not Progressing

## 2021-12-30 NOTE — Progress Notes (Signed)
°   12/30/21 1512  Assess: MEWS Score  Temp 98.8 F (37.1 C)  BP (!) 124/53 (Anan Dapolito RN, MD and RR notified of vitals, pin point pupils)  Pulse Rate 98  Resp (!) 34 (Esmee Fallaw RN, MD and RR notified of vitals, pin point pupils)  Level of Consciousness Responds to Pain  SpO2 97 %  Assess: if the MEWS score is Yellow or Red  Were vital signs taken at a resting state? Yes  Focused Assessment Change from prior assessment (see assessment flowsheet)  Does the patient meet 2 or more of the SIRS criteria? Yes  Does the patient have a confirmed or suspected source of infection? Yes  Provider and Rapid Response Notified? Yes  MEWS guidelines implemented *See Row Information* Yes  Treat  MEWS Interventions Escalated (See documentation below)  Pain Scale Faces  Pain Score 0  Faces Pain Scale 0  Take Vital Signs  Increase Vital Sign Frequency  Yellow: Q 2hr X 2 then Q 4hr X 2, if remains yellow, continue Q 4hrs  Escalate  MEWS: Escalate Yellow: discuss with charge nurse/RN and consider discussing with provider and RRT  Notify: Charge Nurse/RN  Name of Charge Nurse/RN Notified Pamala Hurry RN  Date Charge Nurse/RN Notified 12/30/21  Time Charge Nurse/RN Notified 1512  Notify: Provider  Provider Name/Title [place]  Date Provider Notified 12/30/21  Time Provider Notified 1515  Notification Type Page  Notification Reason Change in status  Provider response At bedside;No new orders  Date of Provider Response 12/30/21  Time of Provider Response 1516  Notify: Rapid Response  Name of Rapid Response RN Notified Gerald Stabs RN  Date Rapid Response Notified 12/30/21  Time Rapid Response Notified 5625  Document  Patient Outcome Stabilized after interventions  Progress note created (see row info) Yes  Assess: SIRS CRITERIA  SIRS Temperature  0  SIRS Pulse 1  SIRS Respirations  1  SIRS WBC 0  SIRS Score Sum  2   Pt returned from IR after drain placement and was found to respond to painful stimuli only with  pinpoint pupils and tachypnea. Charge RN Pamala Hurry called and Rapid Response RN Gerald Stabs, narcan was given. Dr. Verlon Au aware and came to bedside, vital sign frequency increased, no new orders.

## 2021-12-30 NOTE — Progress Notes (Signed)
Progress Note   Subjective  Chief Complaint:  Metastatic colon cancer to liver with prior biliary obstruction requiring biliary stents  This morning patient is found in some mild distress, tachypneic, tells me she is very thirsty and feels "dry", she has a cup of water by her bed which I do not think she is able to reach on her own.  In general is somewhat uncomfortable.  Not very communicative.  No family members in the room.   Objective   Vital signs in last 24 hours: Temp:  [97.7 F (36.5 C)-98.7 F (37.1 C)] 97.7 F (36.5 C) (01/04 0319) Pulse Rate:  [96-117] 117 (01/04 0954) Resp:  [20-22] 22 (01/04 0319) BP: (126-138)/(55-70) 138/70 (01/04 0954) SpO2:  [94 %] 94 % (01/04 0319) Weight:  [120.9 kg] 120.9 kg (01/04 0102) Last BM Date:  (UTA) General:    Elderly white female in mild distress Heart:  Regular rate and rhythm; no murmurs Lungs: Tachypneic with some increased work of breathing, respirations even, lungs CTA bilaterally Abdomen:  Soft, moderate to marked generalized abdominal TTP with some involuntary guarding and nondistended. Normal bowel sounds. Psych: Not very communicative  Intake/Output from previous day: 01/03 0701 - 01/04 0700 In: -  Out: 500 [Urine:500]  Lab Results: Recent Labs    12/28/21 0519 12/29/21 0415 12/30/21 0412  WBC 15.7* 13.4* 13.6*  HGB 9.9* 10.5* 10.3*  HCT 31.9* 34.4* 34.8*  PLT 315 341 363   BMET Recent Labs    12/28/21 0519 12/29/21 0415 12/30/21 0412  NA 136 138 141  K 3.8 4.3 4.3  CL 104 104 105  CO2 25 26 27   GLUCOSE 125* 112* 122*  BUN 48* 57* 65*  CREATININE 2.05* 2.31* 2.30*  CALCIUM 8.1* 8.3* 8.4*   LFT Recent Labs    12/28/21 0519 12/29/21 0415 12/30/21 0412  PROT 6.3*   < > 6.4*  ALBUMIN 1.8*   < > 1.7*  AST 59*   < > 50*  ALT 34   < > 31  ALKPHOS 251*   < > 217*  BILITOT 3.5*   < > 4.6*  BILIDIR 2.2*  --   --   IBILI 1.3*  --   --    < > = values in this interval not displayed.    PT/INR Recent Labs    12/29/21 0415 12/30/21 0412  LABPROT 55.5* 23.5*  INR 6.3* 2.1*     Assessment / Plan:   Assessment: 1.  Metastatic colon cancer to the liver: With prior biliary obstruction requiring biliary stents, 1 uncovered SEMS and 1 plastic stent, placed in November 2022; 8 cm x 5 cm heterogenous mass in the left lobe of the liver with additional pockets measuring nearly 4 cm (mets with necrosis versus abscess), IR following for drainage 2.  Chronic Coumadin anticoagulation for A. fib: INR down today after vitamin K to 2.1 3.  History of breast and lung cancer 4.  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  Again appreciate IR's recommendations in regards to abscess drainage, it appears they are placing hepatic drain today. 2.  Further discussion is being had amongst our advanced endoscopy team to decide if stent exchange/additional stents are recommended at this time, again this would need to take place after abscess drainage 3.  Continue antibiotics 4.  Continue current diet  Thank you for kind consultation, we will continue to follow along.   LOS: 3 days   Levin Erp  12/30/2021, 10:17 AM

## 2021-12-30 NOTE — Significant Event (Signed)
Rapid Response Event Note   Reason for Call : Notified by [facility] charge RN that patient was lethargic and had just arrived from a IR procedure. Vital signs were stable upon arrival. Patient only responding to painful stimuli and pupils were pin point. Patient had a drain placed and receive 51mcg of fentanyl and 0.5mg  of versed.    Initial Focused Assessment:  Neuro: Only able to respond to pain (sternal rub), temperature: 98.8 Cardiac: HR 99, BP 124/53 Pulmonary: 97% on RA   Interventions:  0.4mg  of narcan per rapid response protocol was given  -Patient's mentation did improve-able to state name and able to state that she was in the hospital -Patient able to follow simple commands  -IR procedure medications still active in [facility]- now discontinued  MD [place] notified   Plan of Care:  Patient okay to remain on [facility]. If patient becomes lethargic or has decreased in LOC from baseline, please call rapid response. If patient has any decline in hemodynamic status-please call rapid response at [phone]    Event Summary:   MD Notified: MD Verlon Au  by bedside RN  Call Time: [place] Time: 1500 End Time:  Laurence Slate, RN

## 2021-12-30 NOTE — Progress Notes (Addendum)
Patient ID: Tammie [name], female   DOB: 1941-12-01, 81 y.o.   MRN: [id number] Pt's PT/INR today is 23.5/2.1; findings d/w Dr. Willette Alma); plan for hepatic drain placement today; pt updated; nurse aware

## 2021-12-30 NOTE — Progress Notes (Signed)
PROGRESS NOTE   Tammie [name]  QXI:[id number] DOB: [birth year] DOA: 12/26/2021 PCP: [name], FNP  Brief Narrative:  81 year old female at baseline uses cane to ambulate Known history of invasive ductal breast  2012 prior VATS NSCLC lung  Metastatic colon cancer with liver mets follows with Dr. Earlie Server A. fib CHADS2 score >3 on Coumadin HFpEF HTN HLD BMI 39 COPD on 2 L of oxygen Recent diagnosis 6.2 X5.2X 5.7 cm enhancing lesion larger thought to be metastatic disease causing biliary obstruction status post ERCP asked to 11/16/2021  Presented to ED 12/27/2021 abdominal pain back pain nausea vomiting WBC 16 CT abdomen pelvis new liver mass concerning for progressive mets versus necrosis + pancreatitis-- Started on Unasyn, GI consulted--felt this was an abscess and would need IR consult INR reversed 12/28/2021 rapid response called patient in respiratory distress,   Hospital-Problem based course  Sepsis secondary to possible liver abscess  Pancreatitis Planning for liver biopsy ERCP with stent Continue IV Unasyn Await sampling of liver pathology get cytology in addition Continue LR 75 cc/H Metastatic colon cancer to liver with prior biliary obstruction requiring stents 10/2021 MRI shows 8X5 mass left lobe liver Further plans as per them  because she is somewhat tremulous today I will get ammonia with next labs Chronic A. fib RVR CHADS2 score >2--supratherapeutic on Coumadin Continue metoprolol 50 twice daily, as needed Toprol injection 2.5 every 4 as needed heart rate >130 INR previously above 6 is down to 2.1 and this is okay with interventional radiology for them to manage-received 10 mg of vitamin K this morning Hypotension in the setting of prior hypertension Amlodipine on hold COPD on 2 L oxygen at home Continue glycopyrrolate 0.2 every 4 as needed excess secretions Continue albuterol 3 mils every 6 as needed Hypoalbuminemia Supplement Class II obesity Prior  breast cancer Continue letrozole 2.5 daily Goals of care--seen previously by palliative care is currently DNR as per discussion 12/28/2021  DVT prophylaxis: Supratherapeutic on Coumadin Code Status: DNR confirmed Family Communication: No family present at the bedside today Disposition:  Status is: Inpatient  Remains inpatient appropriate because: Ill     Consultants:  Gastroenterology  Procedures: Biopsy and ERCP planned 1/4  Antimicrobials: IV Unasyn   Subjective: Responsive but not really talkative-states she has abdominal pain When she raises her head she is tremulous  Objective: Vitals:   12/29/21 0909 12/29/21 2020 12/30/21 0102 12/30/21 0319  BP: (!) 104/58 (!) 126/57  (!) 128/55  Pulse: 97 96  100  Resp:  20  (!) 22  Temp: 97.8 F (36.6 C) 98.7 F (37.1 C)  97.7 F (36.5 C)  TempSrc: Oral Axillary  Axillary  SpO2: 97%   94%  Weight:  120.9 kg 120.9 kg   Height:        Intake/Output Summary (Last 24 hours) at 12/30/2021 0954 Last data filed at 12/30/2021 0320 Gross per 24 hour  Intake --  Output 500 ml  Net -500 ml   Filed Weights   12/29/21 0119 12/29/21 2020 12/30/21 0102  Weight: 119.5 kg 120.9 kg 120.9 kg    Examination:  Awake but not really verbal to me Eyes are open Tachycardic 120s on exam Distended abdomen grade 3/4 edema with some mild shifting dullness Posterior lung fields not examined Raises arms but is tremulous and weak and I think she has a little bit of a flap  Data Reviewed: personally reviewed   CBC    Component Value Date/Time   WBC 13.6 (H)  12/30/2021 0412   RBC 3.97 12/30/2021 0412   HGB 10.3 (L) 12/30/2021 0412   HGB 12.9 12/07/2021 1305   HGB 13.2 11/24/2021 1148   HGB 13.0 12/22/2017 1013   HCT 34.8 (L) 12/30/2021 0412   HCT 41.1 11/24/2021 1148   HCT 41.8 12/22/2017 1013   PLT 363 12/30/2021 0412   PLT 254 12/07/2021 1305   PLT 214 11/24/2021 1148   MCV 87.7 12/30/2021 0412   MCV 85 11/24/2021 1148   MCV 92.1  12/22/2017 1013   MCH 25.9 (L) 12/30/2021 0412   MCHC 29.6 (L) 12/30/2021 0412   RDW 15.9 (H) 12/30/2021 0412   RDW 13.7 11/24/2021 1148   RDW 14.6 (H) 12/22/2017 1013   LYMPHSABS 0.4 (L) 12/26/2021 2047   LYMPHSABS 0.9 11/24/2021 1148   LYMPHSABS 1.0 12/22/2017 1013   MONOABS 0.8 12/26/2021 2047   MONOABS 0.4 12/22/2017 1013   EOSABS 0.0 12/26/2021 2047   EOSABS 0.4 11/24/2021 1148   BASOSABS 0.0 12/26/2021 2047   BASOSABS 0.1 11/24/2021 1148   BASOSABS 0.0 12/22/2017 1013   CMP Latest Ref Rng & Units 12/30/2021 12/29/2021 12/28/2021  Glucose 70 - 99 mg/dL 122(H) 112(H) 125(H)  BUN 8 - 23 mg/dL 65(H) 57(H) 48(H)  Creatinine 0.44 - 1.00 mg/dL 2.30(H) 2.31(H) 2.05(H)  Sodium 135 - 145 mmol/L 141 138 136  Potassium 3.5 - 5.1 mmol/L 4.3 4.3 3.8  Chloride 98 - 111 mmol/L 105 104 104  CO2 22 - 32 mmol/L 27 26 25   Calcium 8.9 - 10.3 mg/dL 8.4(L) 8.3(L) 8.1(L)  Total Protein 6.5 - 8.1 g/dL 6.4(L) 6.2(L) 6.3(L)  Total Bilirubin 0.3 - 1.2 mg/dL 4.6(H) 3.8(H) 3.5(H)  Alkaline Phos 38 - 126 U/L 217(H) 259(H) 251(H)  AST 15 - 41 U/L 50(H) 60(H) 59(H)  ALT 0 - 44 U/L 31 37 34     Radiology Studies: No results found.   Scheduled Meds:  Chlorhexidine Gluconate Cloth  6 each Topical Daily   famotidine  20 mg Oral Daily   feeding supplement  1 Container Oral TID BM   feeding supplement  237 mL Oral BID BM   letrozole  2.5 mg Oral Daily   mouth rinse  15 mL Mouth Rinse BID   metoprolol tartrate  50 mg Oral BID   pantoprazole  40 mg Oral Daily   sodium chloride flush  10-40 mL Intracatheter Q12H   valACYclovir  500 mg Oral BID   Continuous Infusions:  sodium chloride 10 mL/hr at 12/28/21 0600   ampicillin-sulbactam (UNASYN) IV 3 g (12/30/21 0149)   lactated ringers 75 mL/hr at 12/30/21 0606   phytonadione (VITAMIN K) IV       LOS: 3 days   Time spent: [place], MD [facility] To contact the attending provider between 7A-7P or the covering provider during  after hours 7P-7A, please log into the web site [email or web address] and access using universal [place] password for that web site. If you do not have the password, please call the hospital operator.  12/30/2021, 9:54 AM

## 2021-12-31 ENCOUNTER — Inpatient Hospital Stay (HOSPITAL_COMMUNITY): Payer: HMO

## 2021-12-31 DIAGNOSIS — Z515 Encounter for palliative care: Secondary | ICD-10-CM

## 2021-12-31 DIAGNOSIS — Z7189 Other specified counseling: Secondary | ICD-10-CM

## 2021-12-31 LAB — COMPREHENSIVE METABOLIC PANEL
ALT: 27 U/L (ref 0–44)
AST: 38 U/L (ref 15–41)
Albumin: 1.7 g/dL — ABNORMAL LOW (ref 3.5–5.0)
Alkaline Phosphatase: 195 U/L — ABNORMAL HIGH (ref 38–126)
Anion gap: 8 (ref 5–15)
BUN: 77 mg/dL — ABNORMAL HIGH (ref 8–23)
CO2: 27 mmol/L (ref 22–32)
Calcium: 8.3 mg/dL — ABNORMAL LOW (ref 8.9–10.3)
Chloride: 106 mmol/L (ref 98–111)
Creatinine, Ser: 2.24 mg/dL — ABNORMAL HIGH (ref 0.44–1.00)
GFR, Estimated: 22 mL/min — ABNORMAL LOW (ref >60)
Glucose, Bld: 153 mg/dL — ABNORMAL HIGH (ref 70–99)
Potassium: 4 mmol/L (ref 3.5–5.1)
Sodium: 141 mmol/L (ref 135–145)
Total Bilirubin: 2.5 mg/dL — ABNORMAL HIGH (ref 0.3–1.2)
Total Protein: 6.1 g/dL — ABNORMAL LOW (ref 6.5–8.1)

## 2021-12-31 LAB — BLOOD CULTURE ID PANEL (REFLEXED) - BCID2

## 2021-12-31 LAB — PROTIME-INR
INR: 3.2 — ABNORMAL HIGH (ref 0.8–1.2)
Prothrombin Time: 32.4 seconds — ABNORMAL HIGH (ref 11.4–15.2)

## 2021-12-31 LAB — BLOOD GAS, ARTERIAL
Acid-base deficit: 1.6 mmol/L (ref 0.0–2.0)
Bicarbonate: 25.5 mmol/L (ref 20.0–28.0)
Drawn by: 560031
O2 Saturation: 89.4 %
Patient temperature: 98.6
pCO2 arterial: 58.3 mmHg — ABNORMAL HIGH (ref 32.0–48.0)
pH, Arterial: 7.264 — ABNORMAL LOW (ref 7.350–7.450)
pO2, Arterial: 59.8 mmHg — ABNORMAL LOW (ref 83.0–108.0)

## 2021-12-31 LAB — CBC WITH DIFFERENTIAL/PLATELET
Abs Immature Granulocytes: 0.4 10*3/uL — ABNORMAL HIGH (ref 0.00–0.07)
Basophils Absolute: 0 10*3/uL (ref 0.0–0.1)
Basophils Relative: 0 %
Eosinophils Absolute: 0 10*3/uL (ref 0.0–0.5)
Eosinophils Relative: 0 %
HCT: 34.8 % — ABNORMAL LOW (ref 36.0–46.0)
Hemoglobin: 10.7 g/dL — ABNORMAL LOW (ref 12.0–15.0)
Immature Granulocytes: 3 %
Lymphocytes Relative: 3 %
Lymphs Abs: 0.5 10*3/uL — ABNORMAL LOW (ref 0.7–4.0)
MCH: 27.2 pg (ref 26.0–34.0)
MCHC: 30.7 g/dL (ref 30.0–36.0)
MCV: 88.3 fL (ref 80.0–100.0)
Monocytes Absolute: 0.3 10*3/uL (ref 0.1–1.0)
Monocytes Relative: 2 %
Neutro Abs: 13.9 10*3/uL — ABNORMAL HIGH (ref 1.7–7.7)
Neutrophils Relative %: 92 %
Platelets: 371 10*3/uL (ref 150–400)
RBC: 3.94 MIL/uL (ref 3.87–5.11)
RDW: 16.2 % — ABNORMAL HIGH (ref 11.5–15.5)
WBC: 15.1 10*3/uL — ABNORMAL HIGH (ref 4.0–10.5)
nRBC: 0 % (ref 0.0–0.2)

## 2021-12-31 LAB — MRSA NEXT GEN BY PCR, NASAL: MRSA by PCR Next Gen: NOT DETECTED

## 2021-12-31 LAB — GLUCOSE, CAPILLARY: Glucose-Capillary: 169 mg/dL — ABNORMAL HIGH (ref 70–99)

## 2021-12-31 LAB — AMMONIA: Ammonia: 37 umol/L — ABNORMAL HIGH (ref 9–35)

## 2021-12-31 MED ORDER — LACTULOSE 10 GM/15ML PO SOLN
20.0000 g | Freq: Two times a day (BID) | ORAL | Status: DC
  Administered 2021-12-31 – 2022-01-02 (×4): 20 g via ORAL
  Filled 2021-12-31 (×4): qty 30

## 2021-12-31 MED ORDER — LIP MEDEX EX OINT
TOPICAL_OINTMENT | CUTANEOUS | Status: DC | PRN
  Administered 2021-12-31 – 2022-01-01 (×3): 1 via TOPICAL
  Filled 2021-12-31: qty 7

## 2021-12-31 MED ORDER — NALOXONE HCL 0.4 MG/ML IJ SOLN: 0.4000 mg | INTRAMUSCULAR | Status: DC | PRN

## 2021-12-31 MED ORDER — ORAL CARE MOUTH RINSE
15.0000 mL | Freq: Two times a day (BID) | OROMUCOSAL | Status: DC
  Administered 2021-12-31 – 2022-01-05 (×10): 15 mL via OROMUCOSAL

## 2021-12-31 MED ORDER — NALOXONE HCL 0.4 MG/ML IJ SOLN
0.4000 mg | Freq: Once | INTRAMUSCULAR | Status: AC
  Administered 2021-12-31: 0.4 mg via INTRAVENOUS

## 2021-12-31 MED ORDER — METOPROLOL TARTRATE 5 MG/5ML IV SOLN
5.0000 mg | INTRAVENOUS | Status: DC | PRN
  Administered 2021-12-31 – 2022-01-02 (×4): 5 mg via INTRAVENOUS
  Filled 2021-12-31 (×4): qty 5

## 2021-12-31 MED ORDER — NALOXONE HCL 0.4 MG/ML IJ SOLN
INTRAMUSCULAR | Status: AC
  Filled 2021-12-31: qty 1

## 2021-12-31 MED ORDER — LACTULOSE ENEMA
300.0000 mL | Freq: Once | ORAL | Status: AC
  Administered 2021-12-31: 300 mL via RECTAL
  Filled 2021-12-31: qty 300

## 2021-12-31 MED ORDER — PIPERACILLIN-TAZOBACTAM 3.375 G IVPB
3.3750 g | Freq: Three times a day (TID) | INTRAVENOUS | Status: DC
  Administered 2021-12-31 – 2022-01-02 (×6): 3.375 g via INTRAVENOUS
  Filled 2021-12-31 (×6): qty 50

## 2021-12-31 MED ORDER — CHLORHEXIDINE GLUCONATE 0.12 % MT SOLN
15.0000 mL | Freq: Two times a day (BID) | OROMUCOSAL | Status: DC
  Administered 2021-12-31 – 2022-01-05 (×11): 15 mL via OROMUCOSAL
  Filled 2021-12-31 (×9): qty 15

## 2021-12-31 MED ORDER — NALOXONE HCL 0.4 MG/ML IJ SOLN
INTRAMUSCULAR | Status: AC
  Administered 2021-12-31: 0.4 mg via INTRAVENOUS
  Filled 2021-12-31: qty 1

## 2021-12-31 MED ORDER — NALOXONE HCL 0.4 MG/ML IJ SOLN: 0.4000 mg | Freq: Once | INTRAMUSCULAR | Status: AC

## 2021-12-31 NOTE — Progress Notes (Signed)
Referring Physician(s): Trellis Paganini, Amy PA-C  Supervising Physician: Jacqulynn Cadet  Patient Status:  [facility] - In-pt  Chief Complaint:  S/p hepatic abscess drain placement on 1/4   Subjective:  Pt was transferred to step down today.  On CPAP, sleeping.   Allergies: Tape, Contrast media [iodinated contrast media], Iohexol, Sulfa antibiotics, and Sulfamethoxazole-trimethoprim  Medications: Prior to Admission medications   Medication Sig Start Date End Date Taking? Authorizing Provider  albuterol (VENTOLIN HFA) 108 (90 Base) MCG/ACT inhaler INHALE 2 PUFFS BY MOUTH EVERY 6 HOURS AS NEEDED FOR WHEEZING FOR SHORTNESS OF BREATH Patient taking differently: Inhale 2 puffs into the lungs every 6 (six) hours as needed for wheezing or shortness of breath. 12/22/21  Yes Hawks, Christy A, FNP  amLODipine (NORVASC) 10 MG tablet Take 1 tablet by mouth once daily Patient taking differently: Take 10 mg by mouth daily. 12/22/21  Yes Hawks, Christy A, FNP  Ferrous Sulfate (IRON) 142 (45 Fe) MG TBCR Take 142 mg by mouth every evening.   Yes [provider]  Flaxseed, Linseed, (FLAXSEED OIL PO) Take 1 tablet by mouth at bedtime.   Yes [provider]  furosemide (LASIX) 20 MG tablet Take 1 tablet by mouth twice daily Patient taking differently: Take 20 mg by mouth 2 (two) times daily. 12/22/21  Yes Hawks, Christy A, FNP  Garlic 9373 MG CAPS Take 1,000 mg by mouth daily.   Yes [provider]  letrozole (FEMARA) 2.5 MG tablet Take 1 tablet (2.5 mg total) by mouth daily. 07/28/21  Yes Nicholas Lose, MD  metoprolol tartrate (LOPRESSOR) 100 MG tablet Take 50 mg by mouth 2 (two) times daily.   Yes [provider]  Omega-3 Fatty Acids (FISH OIL) 1000 MG CAPS Take 1 capsule by mouth daily.   Yes [provider]  promethazine (PHENERGAN) 25 MG tablet Take 1 tablet (25 mg total) by mouth every 8 (eight) hours as needed for nausea or vomiting. 12/20/21  Yes Curt Bears, MD  simvastatin (ZOCOR) 10 MG tablet TAKE 1 TABLET BY MOUTH ONCE DAILY 6 IN THE EVENING Patient taking differently: Take 10 mg by mouth daily at 6 PM. 12/15/21  Yes Hawks, Christy A, FNP  warfarin (COUMADIN) 2.5 MG tablet TAKE ONE TABLET BY MOUTH ONCE DAILY EXCEPT 1/2 TABLET ON FRIDAYS. Patient taking differently: Take 2.5 mg by mouth daily. 08/13/21  Yes Hawks, Christy A, FNP  acetaminophen (TYLENOL) 650 MG CR tablet Take 650 mg by mouth 3 (three) times daily as needed for pain.  Patient not taking: Reported on 12/27/2021    [provider]  famotidine (PEPCID) 20 MG tablet Take 20 mg by mouth 2 (two) times daily. Patient not taking: Reported on 12/27/2021 11/27/21   [provider]  metoprolol tartrate (LOPRESSOR) 50 MG tablet Take 1 tablet (50 mg total) by mouth 2 (two) times daily. Patient not taking: Reported on 12/27/2021 11/16/21   Shelly Coss, MD  pantoprazole (PROTONIX) 40 MG tablet Take 1 tablet (40 mg total) by mouth daily. Patient not taking: Reported on 12/27/2021 11/17/21   Shelly Coss, MD  polyethylene glycol (MIRALAX) 17 g packet Take 17 g by mouth daily as needed. Patient not taking: Reported on 12/27/2021 11/16/21   Shelly Coss, MD  prochlorperazine (COMPAZINE) 10 MG tablet Take 1 tablet (10 mg total) by mouth every 6 (six) hours as needed for nausea or vomiting. Patient not taking: Reported on 12/27/2021 12/07/21   Curt Bears, MD  valACYclovir (VALTREX) 500 MG tablet  Take 1 tablet (500 mg total) by mouth 2 (two) times daily. Patient not taking: Reported on 12/27/2021 12/07/21   Curt Bears, MD     Vital Signs: BP 111/60 (BP Location: Left Arm)    Pulse (!) 110    Temp (!) 97.4 F (36.3 C)    Resp 20    Ht 5\' 10"  (1.778 m)    Wt 258 lb 11.2 oz (117.3 kg)    SpO2 90%    BMI 37.12 kg/m   Physical Exam Vitals reviewed.  Constitutional:      General: She is not in acute distress.    Appearance: She is ill-appearing.  HENT:     Head:  Normocephalic and atraumatic.  Abdominal:     General: Abdomen is flat.     Palpations: Abdomen is soft.  Skin:    General: Skin is warm and dry.     Coloration: Skin is not jaundiced or pale.     Comments: Positive RUQ drain to a gravity bag. Site is unremarkable with no erythema, edema, tenderness, bleeding or drainage. Suture and stat lock in place. Dressing is clean, dry, and intact. Trace of  blood colored fluid noted in the bag. Drain aspirates and flushes well.      Imaging: DG CHEST PORT 1 VIEW  Result Date: 12/31/2021 CLINICAL DATA:  Follow-up pneumonia EXAM: PORTABLE CHEST 1 VIEW COMPARISON:  Chest radiograph 12/28/2021 FINDINGS: The right chest wall port is in stable position. The heart is enlarged, unchanged. The mediastinal contours are prominent but unchanged. There there are probable trace bilateral pleural effusions with mild adjacent atelectasis. There is no pulmonary edema. There is no pneumothorax. The bones are stable. IMPRESSION: Trace bilateral pleural effusions with mild adjacent atelectasis. Electronically Signed   By: Valetta Mole M.D.   On: 12/31/2021 08:01   DG Chest Port 1 View  Result Date: 12/28/2021 CLINICAL DATA:  Pt c/o left sided back pain, especially with movement, sob, weakness. EXAM: PORTABLE CHEST 1 VIEW COMPARISON:  09/16/2020 FINDINGS: Cardiac silhouette is borderline enlarged. No convincing mediastinal or hilar masses. Right anterior chest wall Port-A-Cath is stable, tip in the lower superior vena cava. Lungs demonstrate diffuse bilateral interstitial prominence. No lung consolidation. No convincing pleural effusion and no pneumothorax. Skeletal structures are grossly intact. IMPRESSION: 1. No acute cardiopulmonary disease. Electronically Signed   By: Lajean Manes M.D.   On: 12/28/2021 09:10   CT IMAGE GUIDED DRAINAGE BY PERCUTANEOUS CATHETER  Result Date: 12/30/2021 CLINICAL DATA:  History of lung, colon, and breast cancer. Obstructing mass at the hepatic  hilum, now status post endoscopic stent placement across the biliary confluence. New abdominal pain, CT demonstrating new fluid attenuation collections in the left lobe of the liver concerning for hepatic abscesses. Percutaneous drainage requested. EXAM: CT GUIDED DRAINAGE OF HEPATIC ABSCESS ANESTHESIA/SEDATION: Intravenous Fentanyl 53mcg and Versed 1.5mg  were administered as conscious sedation during continuous monitoring of the patient's level of consciousness and physiological / cardiorespiratory status by the radiology RN, with a total moderate sedation time of 18 minutes. PROCEDURE: The procedure, risks, benefits, and alternatives were explained to the patient. Questions regarding the procedure were encouraged and answered. The patient understands and consents to the procedure. Select axial scans through the liver were obtained. A new left subcapsular collection was identified. The dominant left hepatic lobe collection was localized and an appropriate skin entry site was determined and marked. The operative field was prepped with chlorhexidinein a sterile fashion, and a sterile drape was applied covering  the operative field. A sterile gown and sterile gloves were used for the procedure. Local anesthesia was provided with 1% Lidocaine. Under CT fluoroscopic guidance, 18 gauge trocar needle advanced into the dominant left hepatic lobe collection. Greenish purulent material could be aspirated. Amplatz wire formed centrally within the cavity. The tract was dilated to facilitate placement of a 10 French pigtail drain catheter, formed centrally within the collection, position confirmed on CT. Proximally 20 mL of green purulent material were aspirated, sent for Gram stain and culture. The catheter was secured externally with 0 Prolene suture and StatLock and placed to gravity drain bag. The patient tolerated the procedure well. COMPLICATIONS: None immediate FINDINGS: Multiple low-attenuation collections in the left  hepatic lobe were localized. There is also anterior subcapsular collection involving hepatic segments 2 and 3. purulent appearing material was aspirated from the dominant left hepatic lobe collection. 10 French pigtail drain catheter placed as above. IMPRESSION: 1. Technically successful CT-guided hepatic abscess drain catheter placement. Electronically Signed   By: Lucrezia Europe M.D.   On: 12/30/2021 15:29    Labs:  CBC: Recent Labs    12/28/21 0519 12/29/21 0415 12/30/21 0412 12/31/21 0408  WBC 15.7* 13.4* 13.6* 15.1*  HGB 9.9* 10.5* 10.3* 10.7*  HCT 31.9* 34.4* 34.8* 34.8*  PLT 315 341 363 371    COAGS: Recent Labs    12/27/21 1239 12/28/21 0519 12/29/21 0415 12/30/21 0412  INR 9.4* 5.2* 6.3* 2.1*    BMP: Recent Labs    12/28/21 0519 12/29/21 0415 12/30/21 0412 12/31/21 0408  NA 136 138 141 141  K 3.8 4.3 4.3 4.0  CL 104 104 105 106  CO2 25 26 27 27   GLUCOSE 125* 112* 122* 153*  BUN 48* 57* 65* 77*  CALCIUM 8.1* 8.3* 8.4* 8.3*  CREATININE 2.05* 2.31* 2.30* 2.24*  GFRNONAA 24* 21* 21* 22*    LIVER FUNCTION TESTS: Recent Labs    12/28/21 0519 12/29/21 0415 12/30/21 0412 12/31/21 0408  BILITOT 3.5* 3.8* 4.6* 2.5*  AST 59* 60* 50* 38  ALT 34 37 31 27  ALKPHOS 251* 259* 217* 195*  PROT 6.3* 6.2* 6.4* 6.1*  ALBUMIN 1.8* 1.8* 1.7* 1.7*    Assessment and Plan:  81 y.o. female with extensive metastatic CA history, on Coumadin for A-fib, hepatic abscess s/p drain placement by IR on 1/4.   Patient become unresponsive last night, rapid response called.  Most likely due to excessive narcotics.  WBC up, 15.1 today (13.6 yesterday)  LFT improving  Cx pending   Drain Location: RUQ Size: Fr size: 10 Fr Date of placement: 1/4  Currently to: Drain collection device: gravity 24 hour output:  Output by Drain (mL) 12/29/21 0701 - 12/29/21 1900 12/29/21 1901 - 12/30/21 0700 12/30/21 0701 - 12/30/21 1900 12/30/21 1901 - 12/31/21 0700 12/31/21 0701 - 12/31/21 1006   Closed System Drain 1 Medial RUQ Other (Comment) 10 Fr.   10 30     Interval imaging/drain manipulation:  None   Current examination: Flushes/aspirates easily.  Insertion site unremarkable. Suture and stat lock in place. Dressed appropriately.   Plan: Continue TID flushes with 5 cc NS. Record output Q shift. Dressing changes QD or PRN if soiled.  Call IR APP or on call IR MD if difficulty flushing or sudden change in drain output.  Repeat imaging/possible drain injection once output < 10 mL/QD (excluding flush material.)  Discharge planning: Please contact IR APP or on call IR MD prior to patient d/c to ensure appropriate  follow up plans are in place. Typically patient will follow up with IR clinic 10-14 days post d/c for repeat imaging/possible drain injection. IR scheduler will contact patient with date/time of appointment. Patient will need to flush drain QD with 5 cc NS, record output QD, dressing changes every 2-3 days or earlier if soiled.   IR will continue to follow - please call with questions or concerns.   Electronically Signed: Tera Mater, PA-C 12/31/2021, 10:02 AM   I spent a total of 15 Minutes at the the patient's bedside AND on the patient's hospital floor or unit, greater than 50% of which was counseling/coordinating care for RUQ drain.   This chart was dictated using voice recognition software.  Despite best efforts to proofread,  errors can occur which can change the documentation meaning.

## 2021-12-31 NOTE — Progress Notes (Signed)
RT NOTE:  Pt transferred from [facility] to ICU for BiPAP. No BiPAP order has been placed at this time, MD has been notified by RN. No further orders for RT.

## 2021-12-31 NOTE — Plan of Care (Signed)
Pt lethargic, minimally responsive to painful stimuli. Will open eyes occasionally.  IVF and Abx per orders. GI/Palliative consulted.  Narcan given once this am at MD request, minimal effect.  ABG ordered/resulted. Taken off NRB placed on 4LNC. Continuous pulse ox.  Afib on tele rate 110's. PRN lopressor orders.  Billi/hepatic drain in place. Lactulose enema ordered/given. Fall, skin, and aspiration precautions in place.   Problem: Education: Goal: Knowledge of General Education information will improve Description: Including pain rating scale, medication(s)/side effects and non-pharmacologic comfort measures Outcome: Progressing   Problem: Health Behavior/Discharge Planning: Goal: Ability to manage health-related needs will improve Outcome: Progressing   Problem: Clinical Measurements: Goal: Ability to maintain clinical measurements within normal limits will improve Outcome: Progressing Goal: Will remain free from infection Outcome: Progressing Goal: Diagnostic test results will improve Outcome: Progressing Goal: Respiratory complications will improve Outcome: Progressing Goal: Cardiovascular complication will be avoided Outcome: Progressing   Problem: Activity: Goal: Risk for activity intolerance will decrease Outcome: Progressing   Problem: Nutrition: Goal: Adequate nutrition will be maintained Outcome: Progressing   Problem: Coping: Goal: Level of anxiety will decrease Outcome: Progressing   Problem: Elimination: Goal: Will not experience complications related to bowel motility Outcome: Progressing Goal: Will not experience complications related to urinary retention Outcome: Progressing   Problem: Pain Managment: Goal: General experience of comfort will improve Outcome: Progressing   Problem: Safety: Goal: Ability to remain free from injury will improve Outcome: Progressing   Problem: Skin Integrity: Goal: Risk for impaired skin integrity will  decrease Outcome: Progressing

## 2021-12-31 NOTE — Significant Event (Signed)
Rapid Response Event Note   Reason for Call :  Patient unresponsive, oxygen saturation dropped to 80's, diaphoretic.   Initial Focused Assessment:  Patient with extensive cancer history including liver mets who required Narcan at 1700 on 12/30/21 after IR procedure. Patient received Vicodin 5/325 mg 2 tabs po at 2218 and then Ativan 0.5 mg po at 2318. Patient reportedly did not exhibit adverse effects until nurse reported low oxygen saturations at 0150.  At that time she was diaphoretic, non-responsive, with tachypnea. CBG was 169. Patient is DNR/DNI. Bedside Nurse place patient on NRB. Patient is on continuous pulse oximetry and telemetry.  Interventions:  Reviewed chart and contacted on call hospitalist provider Gershon Cull, NP. Administered ordered dose of Narcan 0.4 mg IV push STAT at [place]. Patient only minimally responsive 12 minutes after dose given, but protecting airway. Unable to maintain saturation > 88% with nasal cannula, advised to keep NRB on for now. Second dose Narcan 0.4 mg IV push ordered and administered at 0230. Patient then responding, moving around, though still lethargic. Vital signs remained adequate for perfusion.   Plan of Care:  No further doses of narcotic to be given. Advised bedside RN that Attending MD will need to address how to provide safe pain management with patient and family. No benzodiazepine reversal ordered at this time. Patient to remain in current level of care with telemetry and continuous oxygen monitoring.   Event Summary:   MD Notified: 0206 Call Time: 0159 Arrival Time: 0202 End Time: [place], RN

## 2021-12-31 NOTE — Progress Notes (Signed)
PROGRESS NOTE   Tammie [name]  QMG:[id number] DOB: 1941-03-30 DOA: 12/26/2021 PCP: [name], FNP  Brief Narrative:  81 year old female at baseline uses cane to ambulate Known history of invasive ductal breast  2012 prior VATS NSCLC lung  Metastatic colon cancer with liver mets follows with Dr. Earlie Server A. fib CHADS2 score >3 on Coumadin HFpEF HTN HLD BMI 39 COPD on 2 L of oxygen Recent diagnosis 6.2 X5.2X 5.7 cm enhancing lesion larger thought to be metastatic disease causing biliary obstruction status post ERCP asked to 11/16/2021  Presented to ED 12/27/2021 abdominal pain back pain nausea vomiting WBC 16 CT abdomen pelvis new liver mass concerning for progressive mets versus necrosis + pancreatitis-- Started on Unasyn, GI consulted--felt this was an abscess and would need IR consult INR reversed 12/28/2021 rapid response called patient in respiratory distress, She has been seen several times subsequently by rapid response and was noted to have hypercarbia She was transferred to stepdown 1/5 after detailed discussions with family   Hospital-Problem based course  Toxic metabolic encephalopathy likely combination of hypercarbia as well as polypharmacy with pain meds  Patient remains lethargic and less responsive long detailed discussion with patient's grandson will We will try BiPAP and see if this makes a difference  She is not aspirating her x-ray actually looks better than the one done on the first and continue supportive measures-no intubation or advanced ACLS procedures sepsis secondary to possible liver abscess  Pancreatitis CT liver drain abscess performed with 10 French catheter-cultures pending Blood cultures from 12/27/2021 are negative Continue IV Unasyn, will continue LR 75 cc/H Metastatic colon cancer to liver with prior biliary obstruction requiring stents 10/2021 MRI shows 8X5 mass left lobe liver ammonia slightly elevated Give lactulose suppositories Chronic  A. fib RVR CHADS2 score >2--supratherapeutic on Coumadin Continue metoprolol 50 twice daily, as needed Toprol injection 2.5 every 4 as needed heart rate >130 I INR still subtherapeutic therefore placed on Lovenox Hypotension in the setting of prior hypertension Amlodipine on hold COPD on 2 L oxygen at home Hold all oral meds Continue albuterol 3 mils every 6 as needed Hypoalbuminemia Supplement Class II obesity Prior breast cancer Hold letrozole 2.5 daily Goals of care--seen previously by palliative care is currently DNR as per discussion 12/28/2021  DVT prophylaxis: Supratherapeutic on Coumadin Code Status: DNR confirmed Family Communication: Long discussion on telephone with grandson Will at [phone] updated in full We discussed trajectory change hypercarbia and need for supportive measures with BiPAP He says that he wants her to be comfortable but then also still wants to try therapeutic options We will reevaluate in a.m. and see how she does Disposition:  Status is: Inpatient  Remains inpatient appropriate because: Ill     Consultants:  Gastroenterology  Procedures: Biopsy and ERCP planned 1/4  Antimicrobials: IV Unasyn   Subjective:  Events overnight noted-rapid response called given Narcan x2 Remains lethargic and relatively unresponsive and has been this way since yesterday Blood gas done showed worsening hypercarbia compared to prior labs Decision made to transfer to stepdown  Objective: Vitals:   12/31/21 0439 12/31/21 0442 12/31/21 0845 12/31/21 1045  BP:  105/70 111/60 107/64  Pulse:  (!) 103 (!) 110 95  Resp:  18 20 20   Temp:  (!) 97.4 F (36.3 C)  97.6 F (36.4 C)  TempSrc:    Axillary  SpO2:  100% 90% 99%  Weight: 117.3 kg     Height:        Intake/Output Summary (Last  24 hours) at 12/31/2021 1104 Last data filed at 12/31/2021 0641 Gross per 24 hour  Intake 3465.02 ml  Output 1040 ml  Net 2425.02 ml    Filed Weights   12/29/21 2020  12/30/21 0102 12/31/21 0439  Weight: 120.9 kg 120.9 kg 117.3 kg    Examination:  Awake nonverbal Eyes are open Tachycardic 120s on exam Distended abdomen grade 3/4 edema  Posterior lung fields not examined Raises arms but is tremulous and weak and I think she has a little bit of a flap  Data Reviewed: personally reviewed   CBC    Component Value Date/Time   WBC 15.1 (H) 12/31/2021 0408   RBC 3.94 12/31/2021 0408   HGB 10.7 (L) 12/31/2021 0408   HGB 12.9 12/07/2021 1305   HGB 13.2 11/24/2021 1148   HGB 13.0 12/22/2017 1013   HCT 34.8 (L) 12/31/2021 0408   HCT 41.1 11/24/2021 1148   HCT 41.8 12/22/2017 1013   PLT 371 12/31/2021 0408   PLT 254 12/07/2021 1305   PLT 214 11/24/2021 1148   MCV 88.3 12/31/2021 0408   MCV 85 11/24/2021 1148   MCV 92.1 12/22/2017 1013   MCH 27.2 12/31/2021 0408   MCHC 30.7 12/31/2021 0408   RDW 16.2 (H) 12/31/2021 0408   RDW 13.7 11/24/2021 1148   RDW 14.6 (H) 12/22/2017 1013   LYMPHSABS 0.5 (L) 12/31/2021 0408   LYMPHSABS 0.9 11/24/2021 1148   LYMPHSABS 1.0 12/22/2017 1013   MONOABS 0.3 12/31/2021 0408   MONOABS 0.4 12/22/2017 1013   EOSABS 0.0 12/31/2021 0408   EOSABS 0.4 11/24/2021 1148   BASOSABS 0.0 12/31/2021 0408   BASOSABS 0.1 11/24/2021 1148   BASOSABS 0.0 12/22/2017 1013   CMP Latest Ref Rng & Units 12/31/2021 12/30/2021 12/29/2021  Glucose 70 - 99 mg/dL 153(H) 122(H) 112(H)  BUN 8 - 23 mg/dL 77(H) 65(H) 57(H)  Creatinine 0.44 - 1.00 mg/dL 2.24(H) 2.30(H) 2.31(H)  Sodium 135 - 145 mmol/L 141 141 138  Potassium 3.5 - 5.1 mmol/L 4.0 4.3 4.3  Chloride 98 - 111 mmol/L 106 105 104  CO2 22 - 32 mmol/L 27 27 26   Calcium 8.9 - 10.3 mg/dL 8.3(L) 8.4(L) 8.3(L)  Total Protein 6.5 - 8.1 g/dL 6.1(L) 6.4(L) 6.2(L)  Total Bilirubin 0.3 - 1.2 mg/dL 2.5(H) 4.6(H) 3.8(H)  Alkaline Phos 38 - 126 U/L 195(H) 217(H) 259(H)  AST 15 - 41 U/L 38 50(H) 60(H)  ALT 0 - 44 U/L 27 31 37     Radiology Studies: DG CHEST PORT 1 VIEW  Result Date:  12/31/2021 CLINICAL DATA:  Follow-up pneumonia EXAM: PORTABLE CHEST 1 VIEW COMPARISON:  Chest radiograph 12/28/2021 FINDINGS: The right chest wall port is in stable position. The heart is enlarged, unchanged. The mediastinal contours are prominent but unchanged. There there are probable trace bilateral pleural effusions with mild adjacent atelectasis. There is no pulmonary edema. There is no pneumothorax. The bones are stable. IMPRESSION: Trace bilateral pleural effusions with mild adjacent atelectasis. Electronically Signed   By: Valetta Mole M.D.   On: 12/31/2021 08:01   CT IMAGE GUIDED DRAINAGE BY PERCUTANEOUS CATHETER  Result Date: 12/30/2021 CLINICAL DATA:  History of lung, colon, and breast cancer. Obstructing mass at the hepatic hilum, now status post endoscopic stent placement across the biliary confluence. New abdominal pain, CT demonstrating new fluid attenuation collections in the left lobe of the liver concerning for hepatic abscesses. Percutaneous drainage requested. EXAM: CT GUIDED DRAINAGE OF HEPATIC ABSCESS ANESTHESIA/SEDATION: Intravenous Fentanyl 57mcg and Versed 1.5mg   were administered as conscious sedation during continuous monitoring of the patient's level of consciousness and physiological / cardiorespiratory status by the radiology RN, with a total moderate sedation time of 18 minutes. PROCEDURE: The procedure, risks, benefits, and alternatives were explained to the patient. Questions regarding the procedure were encouraged and answered. The patient understands and consents to the procedure. Select axial scans through the liver were obtained. A new left subcapsular collection was identified. The dominant left hepatic lobe collection was localized and an appropriate skin entry site was determined and marked. The operative field was prepped with chlorhexidinein a sterile fashion, and a sterile drape was applied covering the operative field. A sterile gown and sterile gloves were used for the  procedure. Local anesthesia was provided with 1% Lidocaine. Under CT fluoroscopic guidance, 18 gauge trocar needle advanced into the dominant left hepatic lobe collection. Greenish purulent material could be aspirated. Amplatz wire formed centrally within the cavity. The tract was dilated to facilitate placement of a 10 French pigtail drain catheter, formed centrally within the collection, position confirmed on CT. Proximally 20 mL of green purulent material were aspirated, sent for Gram stain and culture. The catheter was secured externally with 0 Prolene suture and StatLock and placed to gravity drain bag. The patient tolerated the procedure well. COMPLICATIONS: None immediate FINDINGS: Multiple low-attenuation collections in the left hepatic lobe were localized. There is also anterior subcapsular collection involving hepatic segments 2 and 3. purulent appearing material was aspirated from the dominant left hepatic lobe collection. 10 French pigtail drain catheter placed as above. IMPRESSION: 1. Technically successful CT-guided hepatic abscess drain catheter placement. Electronically Signed   By: Lucrezia Europe M.D.   On: 12/30/2021 15:29     Scheduled Meds:  Chlorhexidine Gluconate Cloth  6 each Topical Daily   famotidine  20 mg Oral Daily   lactulose  20 g Oral BID   lactulose  300 mL Rectal Once   mouth rinse  15 mL Mouth Rinse BID   sodium chloride flush  10-40 mL Intracatheter Q12H   sodium chloride flush  5 mL Intracatheter Q8H   valACYclovir  500 mg Oral BID   Continuous Infusions:  sodium chloride Stopped (12/30/21 2353)   ampicillin-sulbactam (UNASYN) IV 3 g (12/31/21 0746)   lactated ringers 75 mL/hr at 12/31/21 0614   phytonadione (VITAMIN K) IV Stopped (12/30/21 1004)     LOS: 4 days   Time spent: 75  CRITICAL CARE Performed by: Nita Sells   Total critical care time: 30 minutes  Critical care time was exclusive of separately billable procedures and treating other  patients.  Critical care was necessary to treat or prevent imminent or life-threatening deterioration.  Critical care was time spent personally by me on the following activities: development of treatment plan with patient and/or surrogate as well as nursing, discussions with consultants, evaluation of patient's response to treatment, examination of patient, obtaining history from patient or surrogate, ordering and performing treatments and interventions, ordering and review of laboratory studies, ordering and review of radiographic studies, pulse oximetry and re-evaluation of patient's condition.   Nita Sells, MD [facility] To contact the attending provider between 7A-7P or the covering provider during after hours 7P-7A, please log into the web site [email or web address] and access using universal [place] password for that web site. If you do not have the password, please call the hospital operator.  12/31/2021, 11:04 AM

## 2021-12-31 NOTE — Progress Notes (Signed)
RN was rounding on pt and assessed pt to be clammy and diaphoretic. Pt oxygen was assessed to be dropping in the 80's on 4L oxygen nasal cannula. Pt gown and bed noted to be soaked. Pt not responding to voice but was some response to painful stimuli by opening eyes and grimacing. VS taken, cbg checked to be 169, pt cleaned and changed, moaned and mumbled during turning with bed change but not alert. Pt placed on non-re breather and oxygen saturation up to the 90's, rapid response nurse called and at bedside. NP on-call notified by rapid response nurse also at bedside. Francis Gaines Markeise Mathews RN    12/31/21 0150  Vitals  BP 124/61  MAP (mmHg) 80  BP Method Automatic  Pulse Rate 95  Pulse Rate Source Monitor  Resp 18  MEWS COLOR  MEWS Score Color Green  Oxygen Therapy  SpO2 (!) 43 %  O2 Device Nasal Cannula  O2 Flow Rate (L/min) 4 L/min  MEWS Score  MEWS Temp 0  MEWS Systolic 0  MEWS Pulse 0  MEWS RR 0  MEWS LOC 0  MEWS Score 0

## 2021-12-31 NOTE — Progress Notes (Signed)
Daily Progress Note   Patient Name: Tammie [name]       Date: 12/31/2021 DOB: September 20, 1941  Age: 81 y.o. MRN#: [id number] Attending Physician: Nita Sells, MD Primary Care Physician: [name], FNP Admit Date: 12/26/2021  Reason for Consultation/Follow-up: Establishing goals of care  Subjective:  On BIPAP, now in step down Call placed and discussed with grandson Will.  Length of Stay: 4  Current Medications: Scheduled Meds:   chlorhexidine  15 mL Mouth Rinse BID   Chlorhexidine Gluconate Cloth  6 each Topical Daily   famotidine  20 mg Oral Daily   lactulose  20 g Oral BID   mouth rinse  15 mL Mouth Rinse q12n4p   sodium chloride flush  10-40 mL Intracatheter Q12H   sodium chloride flush  5 mL Intracatheter Q8H   valACYclovir  500 mg Oral BID    Continuous Infusions:  sodium chloride Stopped (12/30/21 2353)   lactated ringers 75 mL/hr at 12/31/21 1500   phytonadione (VITAMIN K) IV Stopped (12/30/21 1004)   piperacillin-tazobactam 3.375 g (12/31/21 1602)    PRN Meds: sodium chloride, acetaminophen, albuterol, lip balm, metoprolol tartrate, naLOXone (NARCAN)  injection, ondansetron **OR** ondansetron (ZOFRAN) IV, sodium chloride flush  Physical Exam         Resting in bed Now on BIPAP No edema   Vital Signs: BP (!) 106/52    Pulse (!) 108    Temp 97.6 F (36.4 C) (Axillary)    Resp 20    Ht 5\' 10"  (1.778 m)    Wt 117.3 kg    SpO2 99%    BMI 37.12 kg/m  SpO2: SpO2: 99 % O2 Device: O2 Device: Nasal Cannula O2 Flow Rate: O2 Flow Rate (L/min): 4 L/min  Intake/output summary:  Intake/Output Summary (Last 24 hours) at 12/31/2021 1609 Last data filed at 12/31/2021 1500 Gross per 24 hour  Intake 3993.32 ml  Output 860 ml  Net 3133.32 ml    LBM: Last BM Date:   (UTA) Baseline Weight: Weight: 117.9 kg Most recent weight: Weight: 117.3 kg       Palliative Assessment/Data:      Patient Active Problem List   Diagnosis Date Noted   Abdominal pain 12/27/2021   Obstructive jaundice due to cancer ([place]) 11/12/2021   Hyperbilirubinemia    Hypokalemia  Jaundice    Transaminitis    AKI (acute kidney injury) ([place])    Supratherapeutic INR    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 ([place]) 09/22/2021   Cancer with unknown primary site [facility]) 09/22/2021   Encounter for therapeutic drug monitoring 08/06/2021   Anticoagulated 10/06/2020   Chronic diastolic CHF (congestive heart failure) ([place]) 09/14/2020   Acute respiratory failure with hypoxia ([place]) 10/15/2019   Port-A-Cath in place 05/18/2018   Genetic testing 07/13/2017   Primary adenocarcinoma of colon ([place]) 07/07/2017   Encounter for antineoplastic immunotherapy 12/17/2016   Liver abscess 10/10/2016   Neuropathy 07/26/2016   Antineoplastic chemotherapy induced anemia 03/24/2015   CKD (chronic kidney disease), stage III ([place]) 03/24/2015   Hyperlipidemia with target LDL less than 100 03/10/2015   Peripheral edema 03/10/2015   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 ([place]) 08/29/2014   Malignant neoplasm of upper-inner quadrant of left breast in female, estrogen receptor positive ([place]) 11/13/2013   COPD with chronic bronchitis ([place]) 03/13/2012   HTN (hypertension) 03/13/2012   Atrial fibrillation ([place]) 03/08/2012   Asthma 04/17/2011   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   DOE (dyspnea on exertion) 09/04/2010   Abnormal liver diagnostic imaging 09/04/2010   History of CVA (cerebrovascular accident) 05/21/2009    Palliative Care Assessment & Plan   Patient Profile:    Assessment:  Metastatic colon cancer to the liver: With prior biliary obstruction requiring biliary stents, one uncovered SEMS and 1 plastic stent, placed in November 2022; 8 cm x 5 cm heterogenous mass in the left lobe  of the liver with additional pockets measuring nearly 4 cm-mets with necrosis versus abscess (favor abscess); IR following for abscess drainage when INR allows 2.  Chronic Coumadin anticoagulation for A. fib: Currently with elevated INR 5.2--> 6.3 overnight: discussed with [facility] MD.  3.  History of breast and lung cancer 4.  COPD on home O2 at 2 L  Recommendations/Plan: Call placed and discussed with grandson, time limited trial of current interventions, continue BIPAP for now, he understands the serious incurable nature of the patient's overall condition, PMT to follow.     Code Status:    Code Status Orders  (From admission, onward)           Start     Ordered   12/28/21 0855  Do not attempt resuscitation (DNR)  Continuous       Question Answer Comment  In the event of cardiac or respiratory ARREST Do not call a code blue   In the event of cardiac or respiratory ARREST Do not perform Intubation, CPR, defibrillation or ACLS   In the event of cardiac or respiratory ARREST Use medication by any route, position, wound care, and other measures to relive pain and suffering. May use oxygen, suction and manual treatment of airway obstruction as needed for comfort.      12/28/21 0854           Code Status History     Date Active Date Inactive Code Status Order ID Comments User Context   12/27/2021 0156 12/28/2021 0854 Full Code [id number]  Barton Dubois, MD ED   11/12/2021 2007 11/16/2021 1832 Full Code [id number]  Marcelyn Bruins, MD ED   09/14/2020 0427 09/16/2020 1814 Full Code [id number]  Vianne Bulls, MD ED   10/15/2019 2353 10/16/2019 2157 Full Code [id number]  Vianne Bulls, MD Inpatient   05/27/2017 2112 05/30/2017 1414 Full Code [id number]  Toy Baker, MD Inpatient   07/15/2014 1256 07/20/2014  1801 Full Code [id number]  Ellwood Handler, PA-C Inpatient      Advance Directive Documentation    Flowsheet Row Most Recent Value  Type of Advance Directive Healthcare Power of  Attorney, Living will  Pre-existing out of facility DNR order (yellow form or pink MOST form) --  "MOST" Form in Place? --       Prognosis:  Unable to determine  Discharge Planning: To Be Determined  Care plan was discussed with grand son on the phone.   Thank you for allowing the Palliative Medicine Team to assist in the care of this patient.   Time In: 1500 Time Out: 1525 Total Time  25 Prolonged Time Billed  no       Greater than 50%  of this time was spent counseling and coordinating care related to the above assessment and plan.  Loistine Chance, MD  Please contact Palliative Medicine Team phone at [phone] for questions and concerns.

## 2021-12-31 NOTE — Progress Notes (Signed)
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 Patient found unresponsive, tachypneic and diaphoretic per the nursing staff. Received Narcan 0.10m IV x 2 with brief improvement. RN at bedside at this time. Patient barely opens eyes when name called at this time. No verbal response. Dr. SVerlon Auin process of contacting her significant other to discuss palliative care vs transfer to step down ICU. Patient is a DNR/DNI.   Objective:  Vital signs in last 24 hours: Temp:  [97.4 F (36.3 C)-98.9 F (37.2 C)] 97.4 F (36.3 C) (01/05 0442) Pulse Rate:  [78-117] 110 (01/05 0845) Resp:  [16-34] 20 (01/05 0845) BP: (105-164)/(51-79) 111/60 (01/05 0845) SpO2:  [43 %-100 %] 90 % (01/05 0845) Weight:  [117.3 kg] 117.3 kg (01/05 0439) Last BM Date:  (UTA) General:  Lethargic, barely opens eyes when name called. Eyes: Scleral icterus present.  Heart: Irregular rhythm. No murmur.  Pulm: Breath sounds diminished throughout. On oxygen 4L [place].  Abdomen: Obese abdomen, soft, no obvious tenderness, hypoactive BS x 4 quads. Hepatic drain site intact, drained 40cc past 24 hours. Scant amount of serous sanguineous drainage in drainage bag.  Extremities: Bilateral lower extremities with 1+ edema.  Neurologic: Patient is non conversant.  Psych:  Not anxious.   Intake/Output from previous day: 01/04 0701 - 01/05 0700 In: 3465 [I.V.:3201.7; IV Piggyback:248.3] Out: 1040 [Urine:1000; Drains:40] Intake/Output this shift: No intake/output data recorded.  Lab Results: Recent Labs    12/29/21 0415 12/30/21 0412 12/31/21 0408  WBC 13.4* 13.6* 15.1*  HGB 10.5* 10.3* 10.7*  HCT 34.4* 34.8* 34.8*  PLT 341 363 371   BMET Recent Labs    12/29/21 0415 12/30/21 0412 12/31/21 0408  NA 138 141 141  K 4.3 4.3 4.0  CL 104 105 106  CO2 '26 27 27  ' GLUCOSE 112* 122*  153*  BUN 57* 65* 77*  CREATININE 2.31* 2.30* 2.24*  CALCIUM 8.3* 8.4* 8.3*   LFT Recent Labs    12/31/21 0408  PROT 6.1*  ALBUMIN 1.7*  AST 38  ALT 27  ALKPHOS 195*  BILITOT 2.5*   PT/INR Recent Labs    12/29/21 0415 12/30/21 0412  LABPROT 55.5* 23.5*  INR 6.3* 2.1*   Hepatitis Panel No results for input(s): HEPBSAG, HCVAB, HEPAIGM, HEPBIGM in the last 72 hours.  DG CHEST PORT 1 VIEW  Result Date: 12/31/2021 CLINICAL DATA:  Follow-up pneumonia EXAM: PORTABLE CHEST 1 VIEW COMPARISON:  Chest radiograph 12/28/2021 FINDINGS: The right chest wall port is in stable position. The heart is enlarged, unchanged. The mediastinal contours are prominent but unchanged. There there are probable trace bilateral pleural effusions with mild adjacent atelectasis. There is no pulmonary edema. There is no pneumothorax. The bones are stable. IMPRESSION: Trace bilateral pleural effusions with mild adjacent atelectasis. Electronically Signed   By: PValetta MoleM.D.   On: 12/31/2021 08:01   CT IMAGE GUIDED DRAINAGE BY PERCUTANEOUS CATHETER  Result Date: 12/30/2021 CLINICAL DATA:  History of lung, colon, and breast cancer. Obstructing mass at the hepatic hilum, now status post endoscopic stent placement across the biliary confluence. New abdominal pain, CT demonstrating new fluid attenuation collections in the left lobe of the liver concerning for hepatic abscesses. Percutaneous drainage requested. EXAM: CT GUIDED DRAINAGE OF HEPATIC ABSCESS ANESTHESIA/SEDATION: Intravenous Fentanyl 510m and Versed 1.60m67mere administered as conscious sedation during continuous monitoring of the patient's level of  consciousness and physiological / cardiorespiratory status by the radiology RN, with a total moderate sedation time of 18 minutes. PROCEDURE: The procedure, risks, benefits, and alternatives were explained to the patient. Questions regarding the procedure were encouraged and answered. The patient understands and  consents to the procedure. Select axial scans through the liver were obtained. A new left subcapsular collection was identified. The dominant left hepatic lobe collection was localized and an appropriate skin entry site was determined and marked. The operative field was prepped with chlorhexidinein a sterile fashion, and a sterile drape was applied covering the operative field. A sterile gown and sterile gloves were used for the procedure. Local anesthesia was provided with 1% Lidocaine. Under CT fluoroscopic guidance, 18 gauge trocar needle advanced into the dominant left hepatic lobe collection. Greenish purulent material could be aspirated. Amplatz wire formed centrally within the cavity. The tract was dilated to facilitate placement of a 10 French pigtail drain catheter, formed centrally within the collection, position confirmed on CT. Proximally 20 mL of green purulent material were aspirated, sent for Gram stain and culture. The catheter was secured externally with 0 Prolene suture and StatLock and placed to gravity drain bag. The patient tolerated the procedure well. COMPLICATIONS: None immediate FINDINGS: Multiple low-attenuation collections in the left hepatic lobe were localized. There is also anterior subcapsular collection involving hepatic segments 2 and 3. purulent appearing material was aspirated from the dominant left hepatic lobe collection. 10 French pigtail drain catheter placed as above. IMPRESSION: 1. Technically successful CT-guided hepatic abscess drain catheter placement. Electronically Signed   By: Lucrezia Europe M.D.   On: 12/30/2021 15:29    Assessment / Plan:  1) Metastatic colon cancer to the liver with suspected prior biliary obstruction requiring biliary stents, 1 uncovered SEMS and 1 plastic stent, placed in November 2022; 8 cm x 5 cm heterogenous mass in the left lobe of the liver with additional pockets measuring nearly 4 cm (mets with necrosis versus abscess). S/P liver abscess  drain placement per IR 12/30/2021. LFTs stable. Alk phos 195. T. Bili 2.5. AST 38. ALT 27.  No plans for ERCP evaluation at this juncture. -Continue to trend LFTs -Send hepatic drain fluid for cytology, if enough fluid to send -No indication for ERCP at this time -Our service will sign off today, call with any questions   2) Chronic Coumadin anticoagulation for A. fib: INR down after vitamin K   3) History of breast and lung cancer  4) COPD on home O2 at 2 L  5) Cr 2.30 -> 2.24  6) Leukocytosis. WBC 15.1. Likely from suspected liver abscess. Liver fluid cultures pending. On Zosyn.   7) Normocytic anemia. Hg 10.3 -> 10.7. MCV 88.3. No overt GI bleeding  8) Acute encephalopathy, multifactorial (likely due to liver abscess, Fentanyl/Versed/Ativan) Rapid response was called early am today, patient was unresponsive. Received [place] 0.30m IV x 2 with brief improvement. Last received Fentanyl and Versed 1/4 at 2pm and Ativan at 9pm. -Discussed AMS with Dr. SVerlon Au he plans to contact the patient's significant other/family to discuss palliative/comfort care vs transfer to the ICU step down unit -Lactulose  20gm enema bid ordered by Dr. SVerlon Au      Principal Problem:   Abdominal pain Active Problems:   Abnormal liver diagnostic imaging   Liver abscess   Anticoagulated     LOS: 4 days   CNoralyn Pick 12/31/2021, 10:53 AM

## 2021-12-31 NOTE — Care Management Important Message (Signed)
Important Message  Patient Details Patient transferred to ICU. Name: Tammie [name] MRN: [id number] Date of Birth: February 03, 1941   Medicare Important Message Given:  No     Kerin Salen 12/31/2021, 1:44 PM

## 2021-12-31 NOTE — Progress Notes (Signed)
Pt transferred to SDU/ICU. Belongings and chart sent with patient.  MD in conversation with family. Report given to receiving RN.

## 2021-12-31 NOTE — Progress Notes (Signed)
° ° °  OVERNIGHT PROGRESS REPORT  Notified by Rapid Response RN for Patient unresponsive after receiving medications earlier. According to RN , this appears the same type of event as earlier today requiring Narcan. Patient had received Norco 5/325 mg at 2218 hrs and Lorazapam 0.5 mg at 2318 hrs.  There was some reaction to stimuli at the 12 minute mark after Narcan and a second dose was administered with greater result. She is approximately to the level achieved earlier after similar treatment.  Vitals are 140/56 bp                  100 HR                    20 RR                    98 % SPO2 on O2    HPI: This is a 81 year old female with medical history of breast cancer, lung cancer, colon cancer with metastasis to the liver, asthma, and respiratory failure with hypoxia on 2 L nasal cannula at baseline, atrial fibrillation, CKD IIIb, hypertension, hyperlipidemia.    There is documentation available from similar during the day.12/30/21 1500 hrs.  Patient moves all extremities now and opened eyes to command.  Patient remains in assigned room on continuous Telemetry and SPO2 monitoring.  Gershon Cull MSNA MSN ACNPC-AG Acute Care Nurse Practitioner [place]

## 2021-12-31 NOTE — Progress Notes (Signed)
Transfer order placed for SDU. Bed assigned.  RN attempted to call report, SDU RN to call back shortly.

## 2022-01-01 LAB — CBC WITH DIFFERENTIAL/PLATELET
Abs Immature Granulocytes: 0.61 10*3/uL — ABNORMAL HIGH (ref 0.00–0.07)
Basophils Absolute: 0 10*3/uL (ref 0.0–0.1)
Basophils Relative: 0 %
Eosinophils Absolute: 0 10*3/uL (ref 0.0–0.5)
Eosinophils Relative: 0 %
HCT: 34.6 % — ABNORMAL LOW (ref 36.0–46.0)
Hemoglobin: 10.6 g/dL — ABNORMAL LOW (ref 12.0–15.0)
Immature Granulocytes: 5 %
Lymphocytes Relative: 4 %
Lymphs Abs: 0.6 10*3/uL — ABNORMAL LOW (ref 0.7–4.0)
MCH: 26.8 pg (ref 26.0–34.0)
MCHC: 30.6 g/dL (ref 30.0–36.0)
MCV: 87.4 fL (ref 80.0–100.0)
Monocytes Absolute: 0.3 10*3/uL (ref 0.1–1.0)
Monocytes Relative: 2 %
Neutro Abs: 11 10*3/uL — ABNORMAL HIGH (ref 1.7–7.7)
Neutrophils Relative %: 89 %
Platelets: 366 10*3/uL (ref 150–400)
RBC: 3.96 MIL/uL (ref 3.87–5.11)
RDW: 16.6 % — ABNORMAL HIGH (ref 11.5–15.5)
WBC: 12.5 10*3/uL — ABNORMAL HIGH (ref 4.0–10.5)
nRBC: 0.2 % (ref 0.0–0.2)

## 2022-01-01 LAB — COMPREHENSIVE METABOLIC PANEL
ALT: 23 U/L (ref 0–44)
AST: 30 U/L (ref 15–41)
Albumin: 1.7 g/dL — ABNORMAL LOW (ref 3.5–5.0)
Alkaline Phosphatase: 190 U/L — ABNORMAL HIGH (ref 38–126)
Anion gap: 11 (ref 5–15)
BUN: 76 mg/dL — ABNORMAL HIGH (ref 8–23)
CO2: 26 mmol/L (ref 22–32)
Calcium: 8.5 mg/dL — ABNORMAL LOW (ref 8.9–10.3)
Chloride: 108 mmol/L (ref 98–111)
Creatinine, Ser: 2.16 mg/dL — ABNORMAL HIGH (ref 0.44–1.00)
GFR, Estimated: 23 mL/min — ABNORMAL LOW (ref >60)
Glucose, Bld: 118 mg/dL — ABNORMAL HIGH (ref 70–99)
Potassium: 3.9 mmol/L (ref 3.5–5.1)
Sodium: 145 mmol/L (ref 135–145)
Total Bilirubin: 1.7 mg/dL — ABNORMAL HIGH (ref 0.3–1.2)
Total Protein: 6.3 g/dL — ABNORMAL LOW (ref 6.5–8.1)

## 2022-01-01 LAB — CULTURE, BLOOD (SINGLE): Special Requests: ADEQUATE

## 2022-01-01 LAB — PROTIME-INR
INR: 3.8 — ABNORMAL HIGH (ref 0.8–1.2)
Prothrombin Time: 37.3 seconds — ABNORMAL HIGH (ref 11.4–15.2)

## 2022-01-01 MED ORDER — DILTIAZEM HCL ER COATED BEADS 120 MG PO CP24
120.0000 mg | ORAL_CAPSULE | Freq: Every day | ORAL | Status: DC
  Administered 2022-01-01 – 2022-01-03 (×3): 120 mg via ORAL
  Filled 2022-01-01 (×3): qty 1

## 2022-01-01 NOTE — Progress Notes (Signed)
Referring Physician(s): A. [place] PA  Supervising Physician: Corrie Mckusick  Patient Status:  [name] - In-pt  Chief Complaint:  Hepatic abscess s.p left hepatic abscess drain placement on 1.4.22 by Dr. Vernard Gambles  Subjective:  Unable to assess. Patient alert but lethargic on bipap  Allergies: Tape, Contrast media [iodinated contrast media], Iohexol, Sulfa antibiotics, and Sulfamethoxazole-trimethoprim  Medications: Prior to Admission medications   Medication Sig Start Date End Date Taking? Authorizing Provider  albuterol (VENTOLIN HFA) 108 (90 Base) MCG/ACT inhaler INHALE 2 PUFFS BY MOUTH EVERY 6 HOURS AS NEEDED FOR WHEEZING FOR SHORTNESS OF BREATH Patient taking differently: Inhale 2 puffs into the lungs every 6 (six) hours as needed for wheezing or shortness of breath. 12/22/21  Yes Hawks, Christy A, FNP  amLODipine (NORVASC) 10 MG tablet Take 1 tablet by mouth once daily Patient taking differently: Take 10 mg by mouth daily. 12/22/21  Yes Hawks, Christy A, FNP  Ferrous Sulfate (IRON) 142 (45 Fe) MG TBCR Take 142 mg by mouth every evening.   Yes [provider]  Flaxseed, Linseed, (FLAXSEED OIL PO) Take 1 tablet by mouth at bedtime.   Yes [provider]  furosemide (LASIX) 20 MG tablet Take 1 tablet by mouth twice daily Patient taking differently: Take 20 mg by mouth 2 (two) times daily. 12/22/21  Yes Hawks, Christy A, FNP  Garlic 1025 MG CAPS Take 1,000 mg by mouth daily.   Yes [provider]  letrozole (FEMARA) 2.5 MG tablet Take 1 tablet (2.5 mg total) by mouth daily. 07/28/21  Yes Nicholas Lose, MD  metoprolol tartrate (LOPRESSOR) 100 MG tablet Take 50 mg by mouth 2 (two) times daily.   Yes [provider]  Omega-3 Fatty Acids (FISH OIL) 1000 MG CAPS Take 1 capsule by mouth daily.   Yes [provider]  promethazine (PHENERGAN) 25 MG tablet Take 1 tablet (25 mg total) by mouth every 8 (eight) hours as needed for nausea or vomiting.  12/20/21  Yes Curt Bears, MD  simvastatin (ZOCOR) 10 MG tablet TAKE 1 TABLET BY MOUTH ONCE DAILY 6 IN THE EVENING Patient taking differently: Take 10 mg by mouth daily at 6 PM. 12/15/21  Yes Hawks, Christy A, FNP  warfarin (COUMADIN) 2.5 MG tablet TAKE ONE TABLET BY MOUTH ONCE DAILY EXCEPT 1/2 TABLET ON FRIDAYS. Patient taking differently: Take 2.5 mg by mouth daily. 08/13/21  Yes Hawks, Christy A, FNP  acetaminophen (TYLENOL) 650 MG CR tablet Take 650 mg by mouth 3 (three) times daily as needed for pain.  Patient not taking: Reported on 12/27/2021    [provider]  famotidine (PEPCID) 20 MG tablet Take 20 mg by mouth 2 (two) times daily. Patient not taking: Reported on 12/27/2021 11/27/21   [provider]  metoprolol tartrate (LOPRESSOR) 50 MG tablet Take 1 tablet (50 mg total) by mouth 2 (two) times daily. Patient not taking: Reported on 12/27/2021 11/16/21   Shelly Coss, MD  pantoprazole (PROTONIX) 40 MG tablet Take 1 tablet (40 mg total) by mouth daily. Patient not taking: Reported on 12/27/2021 11/17/21   Shelly Coss, MD  polyethylene glycol (MIRALAX) 17 g packet Take 17 g by mouth daily as needed. Patient not taking: Reported on 12/27/2021 11/16/21   Shelly Coss, MD  prochlorperazine (COMPAZINE) 10 MG tablet Take 1 tablet (10 mg total) by mouth every 6 (six) hours as needed for nausea or vomiting. Patient not taking: Reported on 12/27/2021 12/07/21   Curt Bears, MD  valACYclovir ([place]) 500  MG tablet Take 1 tablet (500 mg total) by mouth 2 (two) times daily. Patient not taking: Reported on 12/27/2021 12/07/21   Curt Bears, MD     Vital Signs: BP 125/75    Pulse (!) 116    Temp 97.7 F (36.5 C) (Oral)    Resp (!) 36    Ht 5\' 10"  (1.778 m)    Wt 254 lb 10.1 oz (115.5 kg)    SpO2 93%    BMI 36.54 kg/m   Physical Exam Vitals and nursing note reviewed.  Constitutional:      Appearance: She is well-developed. She is ill-appearing.  HENT:     Head:  Normocephalic and atraumatic.  Eyes:     Conjunctiva/sclera: Conjunctivae normal.  Pulmonary:     Effort: Pulmonary effort is normal.     Comments: On bipap Abdominal:     Comments: Positive LUQ  drain to gravity bag. Site is unremarkable with no erythema, edema, tenderness, bleeding or drainage noted at exit site. Suture and stat lock in place. Dressing is clean dry and intact. < 5  ml of  serosanguinous colored fluid noted in gravity bag. Drain is able to be flushed easily.    Musculoskeletal:     Cervical back: Normal range of motion.  Neurological:     Mental Status: She is alert and oriented to person, place, and time.    Imaging: DG CHEST PORT 1 VIEW  Result Date: 12/31/2021 CLINICAL DATA:  Follow-up pneumonia EXAM: PORTABLE CHEST 1 VIEW COMPARISON:  Chest radiograph 12/28/2021 FINDINGS: The right chest wall port is in stable position. The heart is enlarged, unchanged. The mediastinal contours are prominent but unchanged. There there are probable trace bilateral pleural effusions with mild adjacent atelectasis. There is no pulmonary edema. There is no pneumothorax. The bones are stable. IMPRESSION: Trace bilateral pleural effusions with mild adjacent atelectasis. Electronically Signed   By: Valetta Mole M.D.   On: 12/31/2021 08:01   CT IMAGE GUIDED DRAINAGE BY PERCUTANEOUS CATHETER  Result Date: 12/30/2021 CLINICAL DATA:  History of lung, colon, and breast cancer. Obstructing mass at the hepatic hilum, now status post endoscopic stent placement across the biliary confluence. New abdominal pain, CT demonstrating new fluid attenuation collections in the left lobe of the liver concerning for hepatic abscesses. Percutaneous drainage requested. EXAM: CT GUIDED DRAINAGE OF HEPATIC ABSCESS ANESTHESIA/SEDATION: Intravenous Fentanyl 76mcg and Versed 1.5mg  were administered as conscious sedation during continuous monitoring of the patient's level of consciousness and physiological /  cardiorespiratory status by the radiology RN, with a total moderate sedation time of 18 minutes. PROCEDURE: The procedure, risks, benefits, and alternatives were explained to the patient. Questions regarding the procedure were encouraged and answered. The patient understands and consents to the procedure. Select axial scans through the liver were obtained. A new left subcapsular collection was identified. The dominant left hepatic lobe collection was localized and an appropriate skin entry site was determined and marked. The operative field was prepped with chlorhexidinein a sterile fashion, and a sterile drape was applied covering the operative field. A sterile gown and sterile gloves were used for the procedure. Local anesthesia was provided with 1% Lidocaine. Under CT fluoroscopic guidance, 18 gauge trocar needle advanced into the dominant left hepatic lobe collection. Greenish purulent material could be aspirated. Amplatz wire formed centrally within the cavity. The tract was dilated to facilitate placement of a 10 French pigtail drain catheter, formed centrally within the collection, position confirmed on CT. Proximally 20 mL of green  purulent material were aspirated, sent for Gram stain and culture. The catheter was secured externally with 0 Prolene suture and StatLock and placed to gravity drain bag. The patient tolerated the procedure well. COMPLICATIONS: None immediate FINDINGS: Multiple low-attenuation collections in the left hepatic lobe were localized. There is also anterior subcapsular collection involving hepatic segments 2 and 3. purulent appearing material was aspirated from the dominant left hepatic lobe collection. 10 French pigtail drain catheter placed as above. IMPRESSION: 1. Technically successful CT-guided hepatic abscess drain catheter placement. Electronically Signed   By: Lucrezia Europe M.D.   On: 12/30/2021 15:29    Labs:  CBC: Recent Labs    12/29/21 0415 12/30/21 0412 12/31/21 0408  01/01/22 0502  WBC 13.4* 13.6* 15.1* 12.5*  HGB 10.5* 10.3* 10.7* 10.6*  HCT 34.4* 34.8* 34.8* 34.6*  PLT 341 363 371 366    COAGS: Recent Labs    12/29/21 0415 12/30/21 0412 12/31/21 2016 01/01/22 0502  INR 6.3* 2.1* 3.2* 3.8*    BMP: Recent Labs    12/29/21 0415 12/30/21 0412 12/31/21 0408 01/01/22 0502  NA 138 141 141 145  K 4.3 4.3 4.0 3.9  CL 104 105 106 108  CO2 26 27 27 26   GLUCOSE 112* 122* 153* 118*  BUN 57* 65* 77* 76*  CALCIUM 8.3* 8.4* 8.3* 8.5*  CREATININE 2.31* 2.30* 2.24* 2.16*  GFRNONAA 21* 21* 22* 23*    LIVER FUNCTION TESTS: Recent Labs    12/29/21 0415 12/30/21 0412 12/31/21 0408 01/01/22 0502  BILITOT 3.8* 4.6* 2.5* 1.7*  AST 60* 50* 38 30  ALT 37 31 27 23   ALKPHOS 259* 217* 195* 190*  PROT 6.2* 6.4* 6.1* 6.3*  ALBUMIN 1.8* 1.7* 1.7* 1.7*    Assessment and Plan:  Drain Location: LUQ hepatic abscess  Size: Fr size: 10 Fr Date of placement: 1.4.23  Currently to: Drain collection device: gravity 24 hour output:  Output by Drain (mL) 12/30/21 0701 - 12/30/21 1900 12/30/21 1901 - 12/31/21 0700 12/31/21 0701 - 12/31/21 1900 12/31/21 1901 - 01/01/22 0700 01/01/22 0701 - 01/01/22 1329  Closed System Drain 1 Medial RUQ Other (Comment) 10 Fr. 10 30 55 10   < 5 ml of serosanguinous output noted to be in the gravity bag. Cultures grew ABUNDANT STREPTOCOCCUS ANGINOSIS  RARE KLEBSIELLA OXYTOCA  Interval imaging/drain manipulation:  81 y.o. female inpatient. History of lung, colon, and breast cancer. Obstructing mass at the hepatic hilum, now status post endoscopic stent placement across the biliary confluence. New abdominal pain, CT demonstrating new fluid attenuation collections in the left lobe of the liver concerning for hepatic abscesses    Current examination: Flushes/aspirates easily.  Insertion site unremarkable. Suture and stat lock in place. Dressed appropriately.   Plan: Continue TID flushes with 5 cc NS. Record output Q  shift. Dressing changes QD or PRN if soiled.  Call IR APP or on call IR MD if difficulty flushing or sudden change in drain output.  Repeat imaging/possible drain injection once output < 10 mL/QD (excluding flush material.)  Discharge planning: Please contact IR APP or on call IR MD prior to patient d/c to ensure appropriate follow up plans are in place. Typically patient will follow up with IR clinic 10-14 days post d/c for repeat imaging/possible drain injection. IR scheduler will contact patient with date/time of appointment. Patient will need to flush drain QD with 5 cc NS, record output QD, dressing changes every 2-3 days or earlier if soiled.   IR will continue  to follow - please call with questions or concerns.   Electronically Signed: Jacqualine Mau, NP 01/01/2022, 1:29 PM   I spent a total of 15 Minutes at the patient's bedside AND on the patient's hospital floor or unit, greater than 50% of which was counseling/coordinating care for left hepatic abscess drain placement.

## 2022-01-01 NOTE — Progress Notes (Signed)
Tammie [name]  ELF:[id number] DOB: [birth year] DOA: 12/26/2021 PCP: [name], FNP  Brief Narrative:  81 year old female at baseline uses cane to ambulate Known history of invasive ductal breast  2012 prior VATS NSCLC lung  Metastatic colon cancer with liver mets follows with Dr. Earlie Server A. fib CHADS2 score >3 on Coumadin HFpEF HTN HLD BMI 39 COPD on 2 L of oxygen Recent diagnosis 6.2 X5.2X 5.7 cm enhancing lesion larger thought to be metastatic disease causing biliary obstruction status post ERCP asked to 11/16/2021  Presented to ED 12/27/2021 abdominal pain back pain nausea vomiting WBC 16 CT abdomen pelvis new liver mass concerning for progressive mets versus necrosis + pancreatitis-- Started on Unasyn, GI consulted--felt this was an abscess and would need IR consult INR reversed 12/28/2021 rapid response called patient in respiratory distress, She has been seen several times subsequently by rapid response and was noted to have hypercarbia She was transferred to stepdown 1/5 after detailed discussions with family Palliative care has been requested to weigh in further   Hospital-Problem based course  Toxic metabolic encephalopathy likely combination of hypercarbia as well as polypharmacy with pain meds  Patient remains lethargic and less responsive  Sent 1/5 to stepdown received over 12 hours of BiPAP but still noncoherent Palliative care to follow-up sepsis secondary to possible liver abscess  Pancreatitis CT liver drain abscess performed with 10 French catheter-cultures gram-positive cocci few gram-negative-Unasyn changed to Zosyn for broad coverage-no need for ID input Blood cultures from 12/27/2021 showing 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 Chronic A. fib RVR CHADS2 score >2--supratherapeutic on Coumadin Continue metoprolol 50 twice daily, as needed Toprol injection 2.5 every 4 as needed heart rate >130  if able to take p.o. would give Cardizem 120 CD INR therapeutic would start Lovenox if drops below 2 Hypotension in the setting of prior hypertension Amlodipine on hold COPD on 2 L oxygen at home Hold all oral meds Continue albuterol 3 mils every 6 as needed Hypoalbuminemia Supplement when able Class II obesity Prior breast cancer Hold letrozole 2.5 daily Goals of care--seen previously by palliative care is currently DNR as per discussion 12/28/2021  DVT prophylaxis: Supratherapeutic on Coumadin Code Status: DNR confirmed Family Communication: Long discussion on telephone with grandson Will at [phone] updated in full Palliative care following remains lethargic on the unit No chest pain but cannot Disposition:  Status is: Inpatient  Remains inpatient appropriate because: Ill     Consultants:  Gastroenterology  Procedures: Biopsy with drain placement and ERCP  1/4  Antimicrobials: IV Unasyn   Subjective:  Remains lethargic  orients only minimally-was on BiPAP most of the night and transition to nasal cannula-refuses to keep it on Remains tachycardic Less work of breathing looks less ill overall  12:10 PM Revisited again and she is and ripping out leads responsive but not coherent and does not answer  Objective: Vitals:   01/01/22 0800 01/01/22 0900 01/01/22 1000 01/01/22 1043  BP: (!) 145/53 (!) 129/53 125/75   Pulse:  (!) 123 (!) 122 (!) 116  Resp: 14 (!) 22 (!) 29 (!) 36  Temp: (!) 97.2 F (36.2 C)     TempSrc: Oral     SpO2: (!) 65% 95% 95% 93%  Weight:      Height:  Intake/Output Summary (Last 24 hours) at 01/01/2022 1148 Last data filed at 01/01/2022 1000 Gross per 24 hour  Intake 1815.3 ml  Output 1095 ml  Net 720.3 ml    Filed Weights   12/30/21 0102 12/31/21 0439  01/01/22 0500  Weight: 120.9 kg 117.3 kg 115.5 kg    Examination:  Awake nonverbal Eyes are open and they are 3 to 4 mm Tachycardic 120s on exam Distended abdomen grade 3/4 edema  Posterior lung fields not examined Quite weak-cannot appreciate flap today  Data Reviewed: personally reviewed   CBC    Component Value Date/Time   WBC 12.5 (H) 01/01/2022 0502   RBC 3.96 01/01/2022 0502   HGB 10.6 (L) 01/01/2022 0502   HGB 12.9 12/07/2021 1305   HGB 13.2 11/24/2021 1148   HGB 13.0 12/22/2017 1013   HCT 34.6 (L) 01/01/2022 0502   HCT 41.1 11/24/2021 1148   HCT 41.8 12/22/2017 1013   PLT 366 01/01/2022 0502   PLT 254 12/07/2021 1305   PLT 214 11/24/2021 1148   MCV 87.4 01/01/2022 0502   MCV 85 11/24/2021 1148   MCV 92.1 12/22/2017 1013   MCH 26.8 01/01/2022 0502   MCHC 30.6 01/01/2022 0502   RDW 16.6 (H) 01/01/2022 0502   RDW 13.7 11/24/2021 1148   RDW 14.6 (H) 12/22/2017 1013   LYMPHSABS 0.6 (L) 01/01/2022 0502   LYMPHSABS 0.9 11/24/2021 1148   LYMPHSABS 1.0 12/22/2017 1013   MONOABS 0.3 01/01/2022 0502   MONOABS 0.4 12/22/2017 1013   EOSABS 0.0 01/01/2022 0502   EOSABS 0.4 11/24/2021 1148   BASOSABS 0.0 01/01/2022 0502   BASOSABS 0.1 11/24/2021 1148   BASOSABS 0.0 12/22/2017 1013   CMP Latest Ref Rng & Units 01/01/2022 12/31/2021 12/30/2021  Glucose 70 - 99 mg/dL 118(H) 153(H) 122(H)  BUN 8 - 23 mg/dL 76(H) 77(H) 65(H)  Creatinine 0.44 - 1.00 mg/dL 2.16(H) 2.24(H) 2.30(H)  Sodium 135 - 145 mmol/L 145 141 141  Potassium 3.5 - 5.1 mmol/L 3.9 4.0 4.3  Chloride 98 - 111 mmol/L 108 106 105  CO2 22 - 32 mmol/L 26 27 27   Calcium 8.9 - 10.3 mg/dL 8.5(L) 8.3(L) 8.4(L)  Total Protein 6.5 - 8.1 g/dL 6.3(L) 6.1(L) 6.4(L)  Total Bilirubin 0.3 - 1.2 mg/dL 1.7(H) 2.5(H) 4.6(H)  Alkaline Phos 38 - 126 U/L 190(H) 195(H) 217(H)  AST 15 - 41 U/L 30 38 50(H)  ALT 0 - 44 U/L 23 27 31      Radiology Studies: DG CHEST PORT 1 VIEW  Result Date: 12/31/2021 CLINICAL DATA:  Follow-up  pneumonia EXAM: PORTABLE CHEST 1 VIEW COMPARISON:  Chest radiograph 12/28/2021 FINDINGS: The right chest wall port is in stable position. The heart is enlarged, unchanged. The mediastinal contours are prominent but unchanged. There there are probable trace bilateral pleural effusions with mild adjacent atelectasis. There is no pulmonary edema. There is no pneumothorax. The bones are stable. IMPRESSION: Trace bilateral pleural effusions with mild adjacent atelectasis. Electronically Signed   By: Valetta Mole M.D.   On: 12/31/2021 08:01   CT IMAGE GUIDED DRAINAGE BY PERCUTANEOUS CATHETER  Result Date: 12/30/2021 CLINICAL DATA:  History of lung, colon, and breast cancer. Obstructing mass at the hepatic hilum, now status post endoscopic stent placement across the biliary confluence. New abdominal pain, CT demonstrating new fluid attenuation collections in the left lobe of the liver concerning for hepatic abscesses. Percutaneous drainage requested. EXAM: CT GUIDED DRAINAGE OF HEPATIC ABSCESS ANESTHESIA/SEDATION: Intravenous Fentanyl 69mcg and Versed 1.5mg  were administered as  conscious sedation during continuous monitoring of the patient's level of consciousness and physiological / cardiorespiratory status by the radiology RN, with a total moderate sedation time of 18 minutes. PROCEDURE: The procedure, risks, benefits, and alternatives were explained to the patient. Questions regarding the procedure were encouraged and answered. The patient understands and consents to the procedure. Select axial scans through the liver were obtained. A new left subcapsular collection was identified. The dominant left hepatic lobe collection was localized and an appropriate skin entry site was determined and marked. The operative field was prepped with chlorhexidinein a sterile fashion, and a sterile drape was applied covering the operative field. A sterile gown and sterile gloves were used for the procedure. Local anesthesia was  provided with 1% Lidocaine. Under CT fluoroscopic guidance, 18 gauge trocar needle advanced into the dominant left hepatic lobe collection. Greenish purulent material could be aspirated. Amplatz wire formed centrally within the cavity. The tract was dilated to facilitate placement of a 10 French pigtail drain catheter, formed centrally within the collection, position confirmed on CT. Proximally 20 mL of green purulent material were aspirated, sent for Gram stain and culture. The catheter was secured externally with 0 Prolene suture and StatLock and placed to gravity drain bag. The patient tolerated the procedure well. COMPLICATIONS: None immediate FINDINGS: Multiple low-attenuation collections in the left hepatic lobe were localized. There is also anterior subcapsular collection involving hepatic segments 2 and 3. purulent appearing material was aspirated from the dominant left hepatic lobe collection. 10 French pigtail drain catheter placed as above. IMPRESSION: 1. Technically successful CT-guided hepatic abscess drain catheter placement. Electronically Signed   By: Lucrezia Europe M.D.   On: 12/30/2021 15:29     Scheduled Meds:  chlorhexidine  15 mL Mouth Rinse BID   Chlorhexidine Gluconate Cloth  6 each Topical Daily   diltiazem  120 mg Oral Daily   famotidine  20 mg Oral Daily   lactulose  20 g Oral BID   mouth rinse  15 mL Mouth Rinse q12n4p   sodium chloride flush  10-40 mL Intracatheter Q12H   sodium chloride flush  5 mL Intracatheter Q8H   valACYclovir  500 mg Oral BID   Continuous Infusions:  sodium chloride Stopped (12/30/21 2353)   lactated ringers 75 mL/hr at 01/01/22 1000   phytonadione (VITAMIN K) IV Stopped (12/30/21 1004)   piperacillin-tazobactam Stopped (01/01/22 1002)     LOS: 5 days   Time spent: [place] Performed by: Nita Sells   Total critical care time: 30 minutes  Critical care time was exclusive of separately billable procedures and treating other  patients.  Critical care was necessary to treat or prevent imminent or life-threatening deterioration.  Critical care was time spent personally by me on the following activities: development of treatment plan with patient and/or surrogate as well as nursing, discussions with consultants, evaluation of patient's response to treatment, examination of patient, obtaining history from patient or surrogate, ordering and performing treatments and interventions, ordering and review of laboratory studies, ordering and review of radiographic studies, pulse oximetry and re-evaluation of patient's condition.   Nita Sells, MD [facility] To contact the attending provider between 7A-7P or the covering provider during after hours 7P-7A, please log into the web site [email or web address] and access using universal [place] password for that web site. If you do not have the password, please call the hospital operator.  01/01/2022, 11:48 AM

## 2022-01-01 NOTE — Progress Notes (Addendum)
[facility] Progress Note  CC:   Metastatic colon cancer to liver with prior biliary obstruction requiring biliary stents, liver abscess  Subjective: Nonconversant.  No family at the bedside.  No overt agitation.  She awakens briefly to swallow p.o. meds without difficulty as reported by her RN.   Objective:  Vital signs in last 24 hours: Temp:  [97.2 F (36.2 C)-97.9 F (36.6 C)] 97.2 F (36.2 C) (01/06 0321) Pulse Rate:  [75-138] 110 (01/06 0600) Resp:  [12-44] 21 (01/06 0600) BP: (81-163)/(29-111) 135/109 (01/06 0600) SpO2:  [90 %-100 %] 99 % (01/06 0600) FiO2 (%):  [30 %] 30 % (01/05 1600) Weight:  [115.5 kg] 115.5 kg (01/06 0500) Last BM Date: 12/31/21 General: Critically ill 81 year old female. Heart: Regular rhythm, no murmur. Pulm: Sounds clear, decreased in the bases. Abdomen: Soft, no significant distention.  No obvious signs of tenderness, no rebound or guarding.  Central upper abdominal hepatic drain site intact, back with a scant amount of dried serosanguineous drainage (drain flushed tid per the nursing staff) Extremities:  Upper and lower extremities with 1+ edema.  Neurologic: Eyes briefly open when name called.  Squeezes hands on command.  Nonconversant. Psych:  Alert and cooperative. Normal mood and affect.  Intake/Output from previous day: 01/05 0701 - 01/06 0700 In: 1474.9 [I.V.:1250.3; IV Piggyback:199.6] Out: 1140 [Urine:1075; Drains:65] Intake/Output this shift: No intake/output data recorded.  Lab Results: Recent Labs    12/30/21 0412 12/31/21 0408 01/01/22 0502  WBC 13.6* 15.1* 12.5*  HGB 10.3* 10.7* 10.6*  HCT 34.8* 34.8* 34.6*  PLT 363 371 366   BMET Recent Labs    12/30/21 0412 12/31/21 0408 01/01/22 0502  NA 141 141 145  K 4.3 4.0 3.9  CL 105 106 108  CO2 _0 GLUCOSE 122* 153* 118*  BUN 65* 77* 76*  CREATININE 2.30* 2.24* 2.16*  CALCIUM 8.4* 8.3* 8.5*   LFT Recent Labs    01/01/22 0502  PROT 6.3*   ALBUMIN 1.7*  AST 30  ALT 23  ALKPHOS 190*  BILITOT 1.7*   PT/INR Recent Labs    12/31/21 2016 01/01/22 0502  LABPROT 32.4* 37.3*  INR 3.2* 3.8*   Hepatitis Panel No results for input(s): HEPBSAG, HCVAB, HEPAIGM, HEPBIGM in the last 72 hours.  DG CHEST PORT 1 VIEW  Result Date: 12/31/2021 CLINICAL DATA:  Follow-up pneumonia EXAM: PORTABLE CHEST 1 VIEW COMPARISON:  Chest radiograph 12/28/2021 FINDINGS: The right chest wall port is in stable position. The heart is enlarged, unchanged. The mediastinal contours are prominent but unchanged. There there are probable trace bilateral pleural effusions with mild adjacent atelectasis. There is no pulmonary edema. There is no pneumothorax. The bones are stable. IMPRESSION: Trace bilateral pleural effusions with mild adjacent atelectasis. Electronically Signed   By: Valetta Mole M.D.   On: 12/31/2021 08:01   CT IMAGE GUIDED DRAINAGE BY PERCUTANEOUS CATHETER  Result Date: 12/30/2021 CLINICAL DATA:  History of lung, colon, and breast cancer. Obstructing mass at the hepatic hilum, now status post endoscopic stent placement across the biliary confluence. New abdominal pain, CT demonstrating new fluid attenuation collections in the left lobe of the liver concerning for hepatic abscesses. Percutaneous drainage requested. EXAM: CT GUIDED DRAINAGE OF HEPATIC ABSCESS ANESTHESIA/SEDATION: Intravenous Fentanyl 47mg and Versed 1.535mwere administered as conscious sedation during continuous monitoring of the patient's level of consciousness and physiological / cardiorespiratory status by the radiology RN, with a total moderate sedation time of 18 minutes. PROCEDURE: The  procedure, risks, benefits, and alternatives were explained to the patient. Questions regarding the procedure were encouraged and answered. The patient understands and consents to the procedure. Select axial scans through the liver were obtained. A new left subcapsular collection was identified. The  dominant left hepatic lobe collection was localized and an appropriate skin entry site was determined and marked. The operative field was prepped with chlorhexidinein a sterile fashion, and a sterile drape was applied covering the operative field. A sterile gown and sterile gloves were used for the procedure. Local anesthesia was provided with 1% Lidocaine. Under CT fluoroscopic guidance, 18 gauge trocar needle advanced into the dominant left hepatic lobe collection. Greenish purulent material could be aspirated. Amplatz wire formed centrally within the cavity. The tract was dilated to facilitate placement of a 10 French pigtail drain catheter, formed centrally within the collection, position confirmed on CT. Proximally 20 mL of green purulent material were aspirated, sent for Gram stain and culture. The catheter was secured externally with 0 Prolene suture and StatLock and placed to gravity drain bag. The patient tolerated the procedure well. COMPLICATIONS: None immediate FINDINGS: Multiple low-attenuation collections in the left hepatic lobe were localized. There is also anterior subcapsular collection involving hepatic segments 2 and 3. purulent appearing material was aspirated from the dominant left hepatic lobe collection. 10 French pigtail drain catheter placed as above. IMPRESSION: 1. Technically successful CT-guided hepatic abscess drain catheter placement. Electronically Signed   By: Lucrezia Europe M.D.   On: 12/30/2021 15:29    Assessment / Plan:  1) Metastatic colon cancer to the liver with suspected prior biliary obstruction requiring hepatic stents, 1 uncovered SEMS and 1 plastic stent, placed in November 2022, 8 cm x 5 cm heterogenous mass in the left lobe of the liver with additional pockets measuring nearly 4 cm (mets with necrosis versus abscess). S/P liver abscess drain placement per IR 12/30/2021. We discussed case with IR who confirmed aspirated hepatic fluid consistent with an abscess and less  likely representing a necrotic mass. It is possible one the hepatic stents could be occluded, however, her liver enzymes are stable and she is medically too unstable (afib RVR, on Bipap and rising INR) to consider ERCP for stent exchange. Liver abscess fluid cultures pending. Gram stain with gram positive cocci and few gram negative rods. Alk phos 195 -> 190. T. Bili 2.5 -> 1.2.  AST 38 -> 30. ALT 27 -> 23.  -Patient is not medically stable for ERCP with stent exchange at this time -Palliative care following, patient is DNR/DNI -Continue Zosyn IV -Recommend ID consult  -Await further recommendations per Dr. Havery Moros  2) Coagulopathy. Admission INR supratherapeutic 7.8 -> 9.4 received Vitamin K -> INR 2.1 on 1/4. Remains off Coumadin with rising INR 3.2 -> today INR 3.8. Suspect rising INR secondary to liver abscess, query sepsis.  -?  Vitamin K    3)  AKI. Cr 2.30 -> 2.24 -> 2.16.   4) Leukocytosis. WBC 15.1 -> 12.5. Likely from suspected liver abscess. Liver fluid cultures pending. Gram stain with gram positive cocci and few gram negative rods. 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 On Zosyn IV.  -?  Repeat blood cultures    5) Normocytic anemia. Hg stable 10.7 -> 10.6.  No overt GI bleeding   6) Acute encephalopathy, multifactorial: likely due to liver abscess and polypharmacy (Fentanyl/Versed/Ativan ) resulting in hypercarbia. Rapid response called 1/5, patient was unresponsive. Received [place] 0.60m IV x 2 with brief improvement. Ammonia level  37, started on Lactulose po 1/5. ABG with PCO2 58.3. Ph 7.264. Transferred to the ICU step down, placed on Bipap.  6) Afib with RVR. On Diltiazem po. Coumadin on hold.   7) History of COPD on oxygen 2L [place] at home  8) History of breast and lung cancer    Principal Problem:   Abdominal pain Active Problems:   Abnormal liver diagnostic imaging   Liver abscess   Anticoagulated   Palliative care by specialist   Goals of care,  counseling/discussion     LOS: 5 days   Noralyn Pick  01/01/2022, 10:09AM
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
## 2022-01-01 NOTE — Progress Notes (Signed)
Daily Progress Note   Patient Name: Tammie [name]       Date: 01/01/2022 DOB: 10/08/1941  Age: 81 y.o. MRN#: [id number] Attending Physician: Nita Sells, MD Primary Care Physician: [name], FNP Admit Date: 12/26/2021  Reason for Consultation/Follow-up: Establishing goals of care  Subjective:  Not tolerating BIPAP, keeps on removing it, using accessory muscles of respiration, call placed and discussed with grandson Will as well as with Mr Alford Highland.   Length of Stay: 5  Current Medications: Scheduled Meds:   chlorhexidine  15 mL Mouth Rinse BID   Chlorhexidine Gluconate Cloth  6 each Topical Daily   diltiazem  120 mg Oral Daily   famotidine  20 mg Oral Daily   lactulose  20 g Oral BID   mouth rinse  15 mL Mouth Rinse q12n4p   sodium chloride flush  10-40 mL Intracatheter Q12H   sodium chloride flush  5 mL Intracatheter Q8H   valACYclovir  500 mg Oral BID    Continuous Infusions:  sodium chloride Stopped (12/30/21 2353)   lactated ringers 75 mL/hr at 01/01/22 1000   piperacillin-tazobactam 3.375 g (01/01/22 1419)    PRN Meds: sodium chloride, acetaminophen, albuterol, lip balm, metoprolol tartrate, naLOXone (NARCAN)  injection, ondansetron **OR** ondansetron (ZOFRAN) IV, sodium chloride flush  Physical Exam         Using accessory muscles Not awake/alert, however family says she was responding some with them last night.  Distended abdomen Appears weak.   Vital Signs: BP 125/75    Pulse (!) 116    Temp 97.7 F (36.5 C) (Oral)    Resp (!) 36    Ht 5\' 10"  (1.778 m)    Wt 115.5 kg    SpO2 93%    BMI 36.54 kg/m  SpO2: SpO2: 93 % O2 Device: O2 Device: Nasal Cannula (pt refusing to wear bipap, MD aware) O2 Flow Rate: O2 Flow Rate (L/min): 2  L/min  Intake/output summary:  Intake/Output Summary (Last 24 hours) at 01/01/2022 1433 Last data filed at 01/01/2022 1000 Gross per 24 hour  Intake 1815.3 ml  Output 710 ml  Net 1105.3 ml    LBM: Last BM Date: 12/31/21 Baseline Weight: Weight: 117.9 kg Most recent weight: Weight: 115.5 kg       Palliative Assessment/Data:  Patient Active Problem List   Diagnosis Date Noted   Palliative care by specialist    Goals of care, counseling/discussion    Abdominal pain 12/27/2021   Obstructive jaundice due to cancer ([place]) 11/12/2021   Hyperbilirubinemia    Hypokalemia    Jaundice    Transaminitis    AKI (acute kidney injury) ([place])    Supratherapeutic INR    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 ([place]) 09/22/2021   Cancer with unknown primary site [facility]) 09/22/2021   Encounter for therapeutic drug monitoring 08/06/2021   Anticoagulated 10/06/2020   Chronic diastolic CHF (congestive heart failure) ([place]) 09/14/2020   Acute respiratory failure with hypoxia ([place]) 10/15/2019   Port-A-Cath in place 05/18/2018   Genetic testing 07/13/2017   Primary adenocarcinoma of colon ([place]) 07/07/2017   Encounter for antineoplastic immunotherapy 12/17/2016   Liver abscess 10/10/2016   Neuropathy 07/26/2016   Antineoplastic chemotherapy induced anemia 03/24/2015   CKD (chronic kidney disease), stage III ([place]) 03/24/2015   Hyperlipidemia with target LDL less than 100 03/10/2015   Peripheral edema 03/10/2015   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 ([place]) 08/29/2014   Malignant neoplasm of upper-inner quadrant of left breast in female, estrogen receptor positive ([place]) 11/13/2013   COPD with chronic bronchitis ([place]) 03/13/2012   HTN (hypertension) 03/13/2012   Atrial fibrillation ([place]) 03/08/2012   Asthma 04/17/2011   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   DOE (dyspnea on exertion) 09/04/2010   Abnormal liver diagnostic imaging 09/04/2010   History of CVA (cerebrovascular accident)  05/21/2009    Palliative Care Assessment & Plan   Patient Profile:    Assessment:  Metastatic colon cancer to the liver: With prior biliary obstruction requiring biliary stents, one uncovered SEMS and 1 plastic stent, placed in November 2022; 8 cm x 5 cm heterogenous mass in the left lobe of the liver with additional pockets measuring nearly 4 cm-mets with necrosis versus abscess (favor abscess); IR following for abscess drainage when INR allows 2.  Chronic Coumadin anticoagulation for A. fib: Currently with elevated INR 5.2--> 6.3 overnight: discussed with TR[facility] MD.  3.  History of breast and lung cancer 4.  COPD on home O2 at 2 L  Recommendations/Plan: Call placed and discussed with Mr Alford Highland and Will her grandson, time limited trial of current interventions, family understands that patient is not tolerating BIPAP for now, goals are not for comfort yet.   PMT to follow.     Code Status:    Code Status Orders  (From admission, onward)           Start     Ordered   12/28/21 0855  Do not attempt resuscitation (DNR)  Continuous       Question Answer Comment  In the event of cardiac or respiratory ARREST Do not call a code blue   In the event of cardiac or respiratory ARREST Do not perform Intubation, CPR, defibrillation or ACLS   In the event of cardiac or respiratory ARREST Use medication by any route, position, wound care, and other measures to relive pain and suffering. May use oxygen, suction and manual treatment of airway obstruction as needed for comfort.      12/28/21 0854           Code Status History     Date Active Date Inactive Code Status Order ID Comments User Context   12/27/2021 0156 12/28/2021 0854 Full Code [id number]  Barton Dubois, MD ED   11/12/2021 2007 11/16/2021 1832 Full Code [id number]  Marcelyn Bruins, MD ED   09/14/2020 0427 09/16/2020 1814 Full Code [id number]  Vianne Bulls, MD ED   10/15/2019 2353 10/16/2019 2157 Full Code [id number]  Vianne Bulls, MD Inpatient   05/27/2017 2112 05/30/2017 1414 Full Code [id number]  Toy Baker, MD Inpatient   07/15/2014 1256 07/20/2014 1801 Full Code [id number]  Caryn Bee Inpatient      Advance Directive Documentation    Flowsheet Row Most Recent Value  Type of Advance Directive Healthcare Power of Attorney, Living will  Pre-existing out of facility DNR order (yellow form or pink MOST form) --  "MOST" Form in Place? --       Prognosis:  Unable to determine  Discharge Planning: To Be Determined  Care plan was discussed with Mr Alford Highland and grand son on the phone.   Thank you for allowing the Palliative Medicine Team to assist in the care of this patient.   Time In: 1400 Time Out: 1435 Total Time 35 Prolonged Time Billed  no       Greater than 50%  of this time was spent counseling and coordinating care related to the above assessment and plan.  Loistine Chance, MD  Please contact Palliative Medicine Team phone at [phone] for questions and concerns.

## 2022-01-02 LAB — PROTIME-INR
INR: 3.9 — ABNORMAL HIGH (ref 0.8–1.2)
Prothrombin Time: 37.9 seconds — ABNORMAL HIGH (ref 11.4–15.2)

## 2022-01-02 MED ORDER — METRONIDAZOLE 500 MG/100ML IV SOLN
500.0000 mg | Freq: Two times a day (BID) | INTRAVENOUS | Status: DC
  Administered 2022-01-02 – 2022-01-05 (×6): 500 mg via INTRAVENOUS
  Filled 2022-01-02 (×6): qty 100

## 2022-01-02 MED ORDER — METOPROLOL TARTRATE 25 MG PO TABS
50.0000 mg | ORAL_TABLET | Freq: Two times a day (BID) | ORAL | Status: DC
  Administered 2022-01-02 – 2022-01-03 (×4): 50 mg via ORAL
  Filled 2022-01-02 (×4): qty 2

## 2022-01-02 MED ORDER — SODIUM CHLORIDE 0.9 % IV SOLN
3.0000 g | Freq: Two times a day (BID) | INTRAVENOUS | Status: DC
  Administered 2022-01-02: 3 g via INTRAVENOUS
  Filled 2022-01-02: qty 8

## 2022-01-02 MED ORDER — LACTULOSE 10 GM/15ML PO SOLN: 10.0000 g | Freq: Every day | ORAL | Status: DC

## 2022-01-02 MED ORDER — SODIUM CHLORIDE 0.9 % IV SOLN
2.0000 g | INTRAVENOUS | Status: DC
  Administered 2022-01-02 – 2022-01-04 (×3): 2 g via INTRAVENOUS
  Filled 2022-01-02 (×4): qty 20

## 2022-01-02 NOTE — Progress Notes (Signed)
Removed PT from BiPAP due to agitation and keeps pulling circuits from BiPAP masks (with mitts on). RN aware.Placed PT on 2 LPM nasal cannula. No respiratory distress noted at this time.
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
## 2022-01-02 NOTE — Progress Notes (Signed)
PROGRESS NOTE   PSALMS OLARTE  FXT:[id number] DOB: Mar 04, 1941 DOA: 12/26/2021 PCP: [name], FNP  Brief Narrative:  81 year old female at baseline uses cane to ambulate Known history of invasive ductal breast  2012 prior VATS NSCLC lung  Metastatic colon cancer with liver mets follows with Dr. Earlie Server A. fib CHADS2 score >3 on Coumadin HFpEF HTN HLD BMI 39 COPD on 2 L of oxygen Recent diagnosis 6.2 X5.2X 5.7 cm enhancing lesion larger thought to be metastatic disease causing biliary obstruction status post ERCP asked to 11/16/2021  Presented to ED 12/27/2021 abdominal pain back pain nausea vomiting WBC 16 CT abdomen pelvis new liver mass concerning for progressive mets versus necrosis + pancreatitis-- Started on Unasyn, GI consulted--felt this was an abscess and would need IR consult INR reversed 12/28/2021 rapid response called patient in respiratory distress, She has been seen several times subsequently by rapid response and was noted to have hypercarbia She was transferred to stepdown 1/5 after detailed discussions with family Palliative care has been requested to weigh in further   Hospital-Problem based course  Toxic metabolic encephalopathy likely combination of hypercarbia as well as polypharmacy with pain meds  Patient is more coherent but still little confused Continue if she allows BiPAP Hopeful for recovery-see below sepsis secondary to possible liver abscess Klebsiella and  step Anginosis in Abscess culture l Occluded stent Pancreatitis Discussed with gastroenterology-possibility of occluded stent causing sepsis however patient is not stable for ERCP CT liver drain abscess performed with 10 French catheter-cultures gram-positive cocci few gram-negative- Unasyn changed change to ceftriaxone as both Bug is sensitive to this Blood cultures from 12/27/2021 showing 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 fib RVR CHADS2 score >2--supratherapeutic on Coumadin Continue metoprolol 50 twice daily, as needed Toprol injection 2.5 every 4 as needed heart rate >130  Increase Cardizem 120 CD-->180 INR therapeutic would start Lovenox if drops below 2 Hypotension in the setting of prior hypertension Amlodipine on hold COPD on 2 L oxygen at home Hold all oral meds Continue albuterol 3 mils every 6 as needed Hypoalbuminemia Supplement when able Class II obesity Prior breast cancer Hold letrozole 2.5 daily Goals of care--seen previously by palliative care is currently DNR as per discussion 12/28/2021  DVT prophylaxis: Supratherapeutic on Coumadin Code Status: DNR confirmed Family Communication: Long discussion on telephone with grandson Will at [phone] updated in full Also updated Bobby [s/o on phone and in person at unit] He is struggling with decision making re: giving up XRT and seems to need time to process the info Appreciate palliative care working with and talking to family Disposition:  Status is: Inpatient  Remains inpatient appropriate because: Ill     Consultants:  Gastroenterology  Procedures: Biopsy with drain placement and ERCP  1/4  Antimicrobials: IV Unasyn   Subjective:  More interactive -tells me date time year Nursing reports multiple loose BM--but is on lactulose She is eating at bedside when I saw her No cp   Objective: Vitals:   01/02/22 1000 01/02/22 1205 01/02/22 1221 01/02/22 1434  BP: (!) 153/74 140/83    Pulse: (!) 113 87    Resp: (!) 32 (!) 24  Temp:   (!) 97.4 F (36.3 C)   TempSrc:   Axillary   SpO2: 97% 99%  95%  Weight:      Height:         Intake/Output Summary (Last 24 hours) at 01/02/2022 1522 Last data filed at 01/02/2022 1426 Gross per 24 hour  Intake 1987.53 ml  Output 650 ml  Net 1337.53 ml    Filed Weights   12/31/21 0439 01/01/22 0500 01/02/22 0400  Weight: 117.3 kg 115.5 kg 116.8 kg    Examination:  Awake more interactive Eomi ncat Tachycardic 120s on exam Distended abdomen grade 3/4 edema --Drain in LUQ Posterior lung fields not examined Quite weak-cannot appreciate flap today--power 5/5 but overal limited by effort  Data Reviewed: personally reviewed   CBC    Component Value Date/Time   WBC 12.5 (H) 01/01/2022 0502   RBC 3.96 01/01/2022 0502   HGB 10.6 (L) 01/01/2022 0502   HGB 12.9 12/07/2021 1305   HGB 13.2 11/24/2021 1148   HGB 13.0 12/22/2017 1013   HCT 34.6 (L) 01/01/2022 0502   HCT 41.1 11/24/2021 1148   HCT 41.8 12/22/2017 1013   PLT 366 01/01/2022 0502   PLT 254 12/07/2021 1305   PLT 214 11/24/2021 1148   MCV 87.4 01/01/2022 0502   MCV 85 11/24/2021 1148   MCV 92.1 12/22/2017 1013   MCH 26.8 01/01/2022 0502   MCHC 30.6 01/01/2022 0502   RDW 16.6 (H) 01/01/2022 0502   RDW 13.7 11/24/2021 1148   RDW 14.6 (H) 12/22/2017 1013   LYMPHSABS 0.6 (L) 01/01/2022 0502   LYMPHSABS 0.9 11/24/2021 1148   LYMPHSABS 1.0 12/22/2017 1013   MONOABS 0.3 01/01/2022 0502   MONOABS 0.4 12/22/2017 1013   EOSABS 0.0 01/01/2022 0502   EOSABS 0.4 11/24/2021 1148   BASOSABS 0.0 01/01/2022 0502   BASOSABS 0.1 11/24/2021 1148   BASOSABS 0.0 12/22/2017 1013   CMP Latest Ref Rng & Units 01/01/2022 12/31/2021 12/30/2021  Glucose 70 - 99 mg/dL 118(H) 153(H) 122(H)  BUN 8 - 23 mg/dL 76(H) 77(H) 65(H)  Creatinine 0.44 - 1.00 mg/dL 2.16(H) 2.24(H) 2.30(H)  Sodium 135 - 145 mmol/L 145 141 141  Potassium 3.5 - 5.1 mmol/L 3.9 4.0 4.3  Chloride 98 - 111 mmol/L 108 106 105  CO2 22 - 32 mmol/L 26 27 27   Calcium 8.9 - 10.3 mg/dL 8.5(L) 8.3(L) 8.4(L)  Total Protein 6.5 - 8.1 g/dL 6.3(L) 6.1(L) 6.4(L)  Total  Bilirubin 0.3 - 1.2 mg/dL 1.7(H) 2.5(H) 4.6(H)  Alkaline Phos 38 - 126 U/L 190(H) 195(H) 217(H)  AST 15 - 41 U/L 30 38 50(H)  ALT 0 - 44 U/L 23 27 31      Radiology Studies: No results found.   Scheduled Meds:  chlorhexidine  15 mL Mouth Rinse BID   Chlorhexidine Gluconate Cloth  6 each Topical Daily   diltiazem  120 mg Oral Daily   famotidine  20 mg Oral Daily   lactulose  20 g Oral BID   mouth rinse  15 mL Mouth Rinse q12n4p   metoprolol tartrate  50 mg Oral BID   sodium chloride flush  10-40 mL Intracatheter Q12H   sodium chloride flush  5 mL Intracatheter Q8H   valACYclovir  500 mg Oral BID   Continuous Infusions:  sodium chloride Stopped (12/30/21 2353)   ampicillin-sulbactam (UNASYN) IV Stopped (01/02/22 1223)   lactated ringers 75 mL/hr at 01/02/22 1247     LOS: 6 days   40 minutes  Jai-Gurmukh  Verlon Au, MD [facility] To contact the attending provider between 7A-7P or the covering provider during after hours 7P-7A, please log into the web site [email or web address] and access using universal [place] password for that web site. If you do not have the password, please call the hospital operator.  01/02/2022, 3:22 PM

## 2022-01-02 NOTE — Progress Notes (Signed)
Daily Progress Note   Patient Name: Tammie [name]       Date: 01/02/2022 DOB: 1941/05/04  Age: 81 y.o. MRN#: [id number] Attending Physician: Nita Sells, MD Primary Care Physician: [name], FNP Admit Date: 12/26/2021  Reason for Consultation/Follow-up: Establishing goals of care  Subjective:  Currently on BIPAP, eyes open, nods head, follows commands, discussed with Mr Alford Highland outsider her room, his wishes are for home with hospice on discharge if the patient rallies enough to be considered for discharge, see below.    Length of Stay: 6  Current Medications: Scheduled Meds:   chlorhexidine  15 mL Mouth Rinse BID   Chlorhexidine Gluconate Cloth  6 each Topical Daily   diltiazem  120 mg Oral Daily   famotidine  20 mg Oral Daily   lactulose  20 g Oral BID   mouth rinse  15 mL Mouth Rinse q12n4p   metoprolol tartrate  50 mg Oral BID   sodium chloride flush  10-40 mL Intracatheter Q12H   sodium chloride flush  5 mL Intracatheter Q8H   valACYclovir  500 mg Oral BID    Continuous Infusions:  sodium chloride Stopped (12/30/21 2353)   ampicillin-sulbactam (UNASYN) IV 3 g (01/02/22 1153)   lactated ringers 75 mL/hr at 01/02/22 1000    PRN Meds: sodium chloride, acetaminophen, albuterol, lip balm, metoprolol tartrate, naLOXone (NARCAN)  injection, ondansetron **OR** ondansetron (ZOFRAN) IV, sodium chloride flush  Physical Exam         Awake alert today Tolerating BIPAP at the moment Eyes open, follows commands Appears weak and chronically ill   Vital Signs: BP 140/83    Pulse 87    Temp (!) 97.4 F (36.3 C) (Axillary)    Resp (!) 24    Ht 5\' 10"  (1.778 m)    Wt 116.8 kg    SpO2 99%    BMI 36.95 kg/m  SpO2: SpO2: 99 % O2 Device: O2 Device: Nasal Cannula O2 Flow  Rate: O2 Flow Rate (L/min): 2 L/min  Intake/output summary:  Intake/Output Summary (Last 24 hours) at 01/02/2022 1233 Last data filed at 01/02/2022 1000 Gross per 24 hour  Intake 1993.09 ml  Output 1150 ml  Net 843.09 ml    LBM: Last BM Date: 01/01/22 Baseline Weight: Weight: 117.9 kg Most recent weight: Weight: 116.8 kg  Palliative Assessment/Data:      Patient Active Problem List   Diagnosis Date Noted   Palliative care by specialist    Goals of care, counseling/discussion    Abdominal pain 12/27/2021   Obstructive jaundice due to cancer ([place]) 11/12/2021   Hyperbilirubinemia    Hypokalemia    Jaundice    Transaminitis    AKI (acute kidney injury) ([place])    Supratherapeutic INR    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 ([place]) 09/22/2021   Cancer with unknown primary site [facility]) 09/22/2021   Encounter for therapeutic drug monitoring 08/06/2021   Anticoagulated 10/06/2020   Chronic diastolic CHF (congestive heart failure) ([place]) 09/14/2020   Acute respiratory failure with hypoxia ([place]) 10/15/2019   Port-A-Cath in place 05/18/2018   Genetic testing 07/13/2017   Primary adenocarcinoma of colon ([place]) 07/07/2017   Encounter for antineoplastic immunotherapy 12/17/2016   Liver abscess 10/10/2016   Neuropathy 07/26/2016   Antineoplastic chemotherapy induced anemia 03/24/2015   CKD (chronic kidney disease), stage III ([place]) 03/24/2015   Hyperlipidemia with target LDL less than 100 03/10/2015   Peripheral edema 03/10/2015   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 ([place]) 08/29/2014   Malignant neoplasm of upper-inner quadrant of left breast in female, estrogen receptor positive ([place]) 11/13/2013   COPD with chronic bronchitis ([place]) 03/13/2012   HTN (hypertension) 03/13/2012   Atrial fibrillation ([place]) 03/08/2012   Asthma 04/17/2011   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   DOE (dyspnea on exertion) 09/04/2010   Abnormal liver diagnostic imaging 09/04/2010   History of CVA  (cerebrovascular accident) 05/21/2009    Palliative Care Assessment & Plan   Patient Profile:    Assessment:  Metastatic colon cancer to the liver: With prior biliary obstruction requiring biliary stents, one uncovered SEMS and 1 plastic stent, placed in November 2022; 8 cm x 5 cm heterogenous mass in the left lobe of the liver with additional pockets measuring nearly 4 cm-mets with necrosis versus abscess (favor abscess); s/p CT liver drain abscess performed with 10 French cathete    2.  Chronic Coumadin anticoagulation for A. fib:   3.  History of breast and lung cancer 4.  COPD on home O2 at 2 L  Recommendations/Plan:  discussed with Mr Alford Highland today about the patient's current condition and high risk for limited prognosis, he asks for home with hospice arrangements, CSW note reviewed.  Continue current mode of care, goals are not for comfort measures only mode of care.     Code Status:    Code Status Orders  (From admission, onward)           Start     Ordered   12/28/21 0855  Do not attempt resuscitation (DNR)  Continuous       Question Answer Comment  In the event of cardiac or respiratory ARREST Do not call a code blue   In the event of cardiac or respiratory ARREST Do not perform Intubation, CPR, defibrillation or ACLS   In the event of cardiac or respiratory ARREST Use medication by any route, position, wound care, and other measures to relive pain and suffering. May use oxygen, suction and manual treatment of airway obstruction as needed for comfort.      12/28/21 0854           Code Status History     Date Active Date Inactive Code Status Order ID Comments User Context   12/27/2021 0156 12/28/2021 0854 Full Code [id number]  Barton Dubois, MD ED  11/12/2021 2007 11/16/2021 1832 Full Code [id number]  Marcelyn Bruins, MD ED   09/14/2020 0427 09/16/2020 1814 Full Code [id number]  Vianne Bulls, MD ED   10/15/2019 2353 10/16/2019 2157 Full Code [id number]   Vianne Bulls, MD Inpatient   05/27/2017 2112 05/30/2017 1414 Full Code [id number]  Toy Baker, MD Inpatient   07/15/2014 1256 07/20/2014 1801 Full Code [id number]  Caryn Bee Inpatient      Advance Directive Documentation    Flowsheet Row Most Recent Value  Type of Advance Directive Healthcare Power of Attorney, Living will  Pre-existing out of facility DNR order (yellow form or pink MOST form) --  "MOST" Form in Place? --       Prognosis:  Guarded   Discharge Planning: Family expresses a preference for home with hospice if possible.   Care plan was discussed with Mr Alford Highland at bedside.   Thank you for allowing the Palliative Medicine Team to assist in the care of this patient.   Time In: 12 Time Out: 12.35 Total Time 35 Prolonged Time Billed  no       Greater than 50%  of this time was spent counseling and coordinating care related to the above assessment and plan.  Loistine Chance, MD  Please contact Palliative Medicine Team phone at [phone] for questions and concerns.

## 2022-01-02 NOTE — TOC Initial Note (Addendum)
Transition of Care [facility]) - Initial/Assessment Note    Patient Details  Name: Tammie [name] MRN: [id number] Date of Birth: [birth year]  Transition of Care [facility]) CM/SW Contact:    Illene Regulus, LCSW Phone Number: 01/02/2022, 12:01 PM  Clinical Narrative:                  CSW spoke with pt's Lenice Pressman [phone]) he stated he would like to take his grandmother home. He stated pt's significant other will be getting 24/hr caretakers in place. He stated he wanted to have home hospice in place as well. Pt's grandson stated he would like [facility] as it is closer to where they live. CSW made referral for home hospice services.   Adden: CSW and hospice liaison Horris Latino spoke with pt's significant other Rafael Bihari at bedside, and explained the difference between home hospice and palliative services. Mr Alford Highland stated the pt is still waning treatment and has an upcoming appointment for radiation. Horris Latino from [place] stated pt only meets criteria for palliative services.    Expected Discharge Plan: Home w Hospice Care Barriers to Discharge: Continued Medical Work up   Patient Goals and CMS Choice Patient states their goals for this hospitalization and ongoing recovery are:: home CMS Medicare.gov Compare Post Acute Care list provided to:: Patient Represenative (must comment) Choice offered to / list presented to : Adult Children  Expected Discharge Plan and Services Expected Discharge Plan: [place] Agency: [facility] Date [facility] Agency Contacted: 01/02/22 Time HH Agency Contacted: 1200    Prior Living Arrangements/Services                       Activities of Daily Living Home Assistive Devices/Equipment: Cane (specify quad or straight) ADL Screening (condition at time of admission) Patient's cognitive ability adequate to safely complete daily activities?: Yes Is  the patient deaf or have difficulty hearing?: No Does the patient have difficulty seeing, even when wearing glasses/contacts?: No Does the patient have difficulty concentrating, remembering, or making decisions?: No Patient able to express need for assistance with ADLs?: Yes Does the patient have difficulty dressing or bathing?: Yes Independently performs ADLs?: No Communication: Independent Dressing (OT): Needs assistance Is this a change from baseline?: Pre-admission baseline Grooming: Needs assistance Is this a change from baseline?: Pre-admission baseline Feeding: Independent Bathing: Needs assistance Is this a change from baseline?: Pre-admission baseline Toileting: Needs assistance Is this a change from baseline?: Pre-admission baseline In/Out Bed: Needs assistance Is this a change from baseline?: Pre-admission baseline Walks in Home: Independent with device (comment) Does the patient have difficulty walking or climbing stairs?: Yes Weakness of Legs: Both Weakness of Arms/Hands: None  Permission Sought/Granted                  Emotional Assessment              Admission diagnosis:  Liver abscess [K75.0] Abdominal pain [R10.9] Patient Active Problem List   Diagnosis Date Noted   Palliative care by specialist    Goals of care, counseling/discussion    Abdominal pain 12/27/2021   Obstructive jaundice due to cancer ([place]) 11/12/2021   Hyperbilirubinemia    Hypokalemia    Jaundice  Transaminitis    AKI (acute kidney injury) ([place])    Supratherapeutic INR    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 ([place]) 09/22/2021   Cancer with unknown primary site [facility]) 09/22/2021   Encounter for therapeutic drug monitoring 08/06/2021   Anticoagulated 10/06/2020   Chronic diastolic CHF (congestive heart failure) ([place]) 09/14/2020   Acute respiratory failure with hypoxia ([place]) 10/15/2019   Port-A-Cath in place 05/18/2018   Genetic testing 07/13/2017   Primary  adenocarcinoma of colon ([place]) 07/07/2017   Encounter for antineoplastic immunotherapy 12/17/2016   Liver abscess 10/10/2016   Neuropathy 07/26/2016   Antineoplastic chemotherapy induced anemia 03/24/2015   CKD (chronic kidney disease), stage III ([place]) 03/24/2015   Hyperlipidemia with target LDL less than 100 03/10/2015   Peripheral edema 03/10/2015   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 ([place]) 08/29/2014   Malignant neoplasm of upper-inner quadrant of left breast in female, estrogen receptor positive ([place]) 11/13/2013   COPD with chronic bronchitis ([place]) 03/13/2012   HTN (hypertension) 03/13/2012   Atrial fibrillation ([place]) 03/08/2012   Asthma 04/17/2011   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   DOE (dyspnea on exertion) 09/04/2010   Abnormal liver diagnostic imaging 09/04/2010   History of CVA (cerebrovascular accident) 05/21/2009   PCP:  [name], FNP Pharmacy:   [facility] [address], [place] - [address] [place] [address] Phone: [phone] Fax: [phone]  [place] [facility]) - [place], [place] [address] Phone: [phone] Fax: [phone]     Social Determinants of Health (SDOH) Interventions    Readmission Risk Interventions No flowsheet data found.

## 2022-01-03 LAB — CBC WITH DIFFERENTIAL/PLATELET
Abs Immature Granulocytes: 0.55 10*3/uL — ABNORMAL HIGH (ref 0.00–0.07)
Basophils Absolute: 0.1 10*3/uL (ref 0.0–0.1)
Basophils Relative: 1 %
Eosinophils Absolute: 0 10*3/uL (ref 0.0–0.5)
Eosinophils Relative: 0 %
HCT: 35.5 % — ABNORMAL LOW (ref 36.0–46.0)
Hemoglobin: 10.7 g/dL — ABNORMAL LOW (ref 12.0–15.0)
Immature Granulocytes: 4 %
Lymphocytes Relative: 4 %
Lymphs Abs: 0.5 10*3/uL — ABNORMAL LOW (ref 0.7–4.0)
MCH: 26.9 pg (ref 26.0–34.0)
MCHC: 30.1 g/dL (ref 30.0–36.0)
MCV: 89.2 fL (ref 80.0–100.0)
Monocytes Absolute: 0.4 10*3/uL (ref 0.1–1.0)
Monocytes Relative: 3 %
Neutro Abs: 11.2 10*3/uL — ABNORMAL HIGH (ref 1.7–7.7)
Neutrophils Relative %: 88 %
Platelets: 306 10*3/uL (ref 150–400)
RBC: 3.98 MIL/uL (ref 3.87–5.11)
RDW: 16.2 % — ABNORMAL HIGH (ref 11.5–15.5)
WBC: 12.8 10*3/uL — ABNORMAL HIGH (ref 4.0–10.5)
nRBC: 0.3 % — ABNORMAL HIGH (ref 0.0–0.2)

## 2022-01-03 LAB — BASIC METABOLIC PANEL
Anion gap: 9 (ref 5–15)
BUN: 52 mg/dL — ABNORMAL HIGH (ref 8–23)
CO2: 27 mmol/L (ref 22–32)
Calcium: 8.2 mg/dL — ABNORMAL LOW (ref 8.9–10.3)
Chloride: 112 mmol/L — ABNORMAL HIGH (ref 98–111)
Creatinine, Ser: 1.74 mg/dL — ABNORMAL HIGH (ref 0.44–1.00)
GFR, Estimated: 29 mL/min — ABNORMAL LOW (ref >60)
Glucose, Bld: 133 mg/dL — ABNORMAL HIGH (ref 70–99)
Potassium: 3.8 mmol/L (ref 3.5–5.1)
Sodium: 148 mmol/L — ABNORMAL HIGH (ref 135–145)

## 2022-01-03 LAB — PROTIME-INR
INR: 3.6 — ABNORMAL HIGH (ref 0.8–1.2)
Prothrombin Time: 35.8 seconds — ABNORMAL HIGH (ref 11.4–15.2)

## 2022-01-03 MED ORDER — DILTIAZEM HCL ER COATED BEADS 180 MG PO CP24: 180.0000 mg | ORAL_CAPSULE | Freq: Every day | ORAL | Status: DC

## 2022-01-03 MED ORDER — HYDROMORPHONE HCL 1 MG/ML IJ SOLN
0.5000 mg | INTRAMUSCULAR | Status: DC | PRN
  Administered 2022-01-03 – 2022-01-05 (×7): 0.5 mg via INTRAVENOUS
  Filled 2022-01-03 (×7): qty 1

## 2022-01-03 MED ORDER — DEXTROSE 5 % IV SOLN: INTRAVENOUS | Status: DC
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
## 2022-01-03 NOTE — Progress Notes (Signed)
Daily Progress Note   Patient Name: Tammie [name]       Date: 01/03/2022 DOB: 02-23-1941  Age: 81 y.o. MRN#: [id number] Attending Physician: Nita Sells, MD Primary Care Physician: [name], FNP Admit Date: 12/26/2021  Reason for Consultation/Follow-up: Establishing goals of care  Subjective: Patient is off the BiPAP, currently on 2 L supplemental oxygen via nasal cannula, oxygen saturations are in the upper 90s, patient appears chronically ill and tired.  She awakens easily and is oriented, is able to converse today.  Patient states " I do not want to keep on living like this.  "  I discussed in detail with bedside RN, appreciate his input and also call placed and discussed with grandson will, see below.     Length of Stay: 7  Current Medications: Scheduled Meds:   chlorhexidine  15 mL Mouth Rinse BID   Chlorhexidine Gluconate Cloth  6 each Topical Daily   diltiazem  120 mg Oral Daily   famotidine  20 mg Oral Daily   lactulose  10 g Oral Daily   mouth rinse  15 mL Mouth Rinse q12n4p   metoprolol tartrate  50 mg Oral BID   sodium chloride flush  10-40 mL Intracatheter Q12H   sodium chloride flush  5 mL Intracatheter Q8H   valACYclovir  500 mg Oral BID    Continuous Infusions:  sodium chloride Stopped (12/30/21 2353)   cefTRIAXone (ROCEPHIN)  IV Stopped (01/02/22 1807)   lactated ringers 75 mL/hr at 01/03/22 1000   metronidazole Stopped (01/03/22 1029)    PRN Meds: sodium chloride, acetaminophen, albuterol, lip balm, metoprolol tartrate, naLOXone (NARCAN)  injection, ondansetron **OR** ondansetron (ZOFRAN) IV, sodium chloride flush  Physical Exam         Awakens easily, is able to verbalize and follow commands. On 2 L oxygen nasal cannula Slightly increased  work of breathing using chest wall and abdominal muscles  Appears weak and chronically ill   Vital Signs: BP (!) 120/52    Pulse (!) 102    Temp 97.6 F (36.4 C) (Oral)    Resp (!) 22    Ht 5\' 10"  (1.778 m)    Wt 117 kg    SpO2 95%    BMI 37.01 kg/m  SpO2: SpO2: 95 % O2 Device: O2 Device: Nasal Cannula  O2 Flow Rate: O2 Flow Rate (L/min): 2 L/min  Intake/output summary:  Intake/Output Summary (Last 24 hours) at 01/03/2022 1258 Last data filed at 01/03/2022 1000 Gross per 24 hour  Intake 2253.45 ml  Output 720 ml  Net 1533.45 ml    LBM: Last BM Date: 01/03/22 Baseline Weight: Weight: 117.9 kg Most recent weight: Weight: 117 kg       Palliative Assessment/Data:      Patient Active Problem List   Diagnosis Date Noted   Palliative care by specialist    Goals of care, counseling/discussion    Abdominal pain 12/27/2021   Obstructive jaundice due to cancer ([place]) 11/12/2021   Hyperbilirubinemia    Hypokalemia    Jaundice    Transaminitis    AKI (acute kidney injury) ([place])    Supratherapeutic INR    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 ([place]) 09/22/2021   Cancer with unknown primary site [facility]) 09/22/2021   Encounter for therapeutic drug monitoring 08/06/2021   Anticoagulated 10/06/2020   Chronic diastolic CHF (congestive heart failure) ([place]) 09/14/2020   Acute respiratory failure with hypoxia ([place]) 10/15/2019   Port-A-Cath in place 05/18/2018   Genetic testing 07/13/2017   Primary adenocarcinoma of colon ([place]) 07/07/2017   Encounter for antineoplastic immunotherapy 12/17/2016   Liver abscess 10/10/2016   Neuropathy 07/26/2016   Antineoplastic chemotherapy induced anemia 03/24/2015   CKD (chronic kidney disease), stage III ([place]) 03/24/2015   Hyperlipidemia with target LDL less than 100 03/10/2015   Peripheral edema 03/10/2015   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 ([place]) 08/29/2014   Malignant neoplasm of upper-inner quadrant of left breast in female,  estrogen receptor positive ([place]) 11/13/2013   COPD with chronic bronchitis ([place]) 03/13/2012   HTN (hypertension) 03/13/2012   Atrial fibrillation ([place]) 03/08/2012   Asthma 04/17/2011   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   DOE (dyspnea on exertion) 09/04/2010   Abnormal liver diagnostic imaging 09/04/2010   History of CVA (cerebrovascular accident) 05/21/2009    Palliative Care Assessment & Plan   Patient Profile:    Assessment:  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  Chronic Coumadin anticoagulation for A. fib:   3.  History of breast and lung cancer 4.  COPD on home O2 at 2 L  Recommendations/Plan: Call placed and discussed with patient's grandson Bill.  I reviewed with him the patient's previously prepared healthcare power of attorney documents that are uploaded on the chart.  Patient prepared a 5 wishes document electing Mr. Alford Highland as her designated healthcare power of attorney agent and then her grandson will ford.  Discussed frankly but compassionately with Mr. Marijean Bravo patient's grandson about her current condition.  Goals wishes and values attempted to be explored.  Discussed with him that the patient has clearly stated her preference for choosing more of a comfort focused pathway of care.  Will states he is aware.  He states that patient's brother and sister-in-law are coming from out of town.  He will be at the hospital later today and asks for some time and space from the medical staff so that the family can continue to discuss amongst themselves about patient's wishes and how best  to go about honoring them.  Palliative services will follow up again on  01-04-2022.  Continue current mode of care.     Code Status:    Code Status Orders  (From admission, onward)           Start     Ordered   12/28/21 0855  Do not attempt resuscitation (DNR)  Continuous       Question Answer Comment  In the event of cardiac or respiratory ARREST Do not call a code blue   In the event of cardiac or respiratory ARREST Do not perform Intubation, CPR, defibrillation or ACLS   In the event of cardiac or respiratory ARREST Use medication by any route, position, wound care, and other measures to relive pain and suffering. May use oxygen, suction and manual treatment of airway obstruction as needed for comfort.      12/28/21 0854           Code Status History     Date Active Date Inactive Code Status Order ID Comments User Context   12/27/2021 0156 12/28/2021 0854 Full Code [id number]  Barton Dubois, MD ED   11/12/2021 2007 11/16/2021 1832 Full Code [id number]  Marcelyn Bruins, MD ED   09/14/2020 0427 09/16/2020 1814 Full Code [id number]  Vianne Bulls, MD ED   10/15/2019 2353 10/16/2019 2157 Full Code [id number]  Vianne Bulls, MD Inpatient   05/27/2017 2112 05/30/2017 1414 Full Code [id number]  Toy Baker, MD Inpatient   07/15/2014 1256 07/20/2014 1801 Full Code [id number]  Barrett, Deforest Hoyles Inpatient      Advance Directive Documentation    Flowsheet Row Most Recent Value  Type of Advance Directive Healthcare Power of Attorney, Living will  Pre-existing out of facility DNR order (yellow form or pink MOST form) --  "MOST" Form in Place? --       Prognosis:  Guarded   Discharge Planning: To be determined  Care plan was discussed with grandson will on the phone.     Thank you for allowing the Palliative Medicine Team to assist in the care of this patient.   Time In: 12 Time Out: 12.35 Total Time 35 Prolonged Time Billed  no       Greater than 50%  of this time was spent counseling and coordinating care related to the above  assessment and plan.  Loistine Chance, MD  Please contact Palliative Medicine Team phone at [phone] for questions and concerns.

## 2022-01-03 NOTE — Progress Notes (Signed)
PROGRESS NOTE   Tammie [name]  HDQ:[id number] DOB: 1941-01-05 DOA: 12/26/2021 PCP: [name], FNP  Brief Narrative:  81 year old female at baseline uses cane to ambulate Known history of invasive ductal breast  2012 prior VATS NSCLC lung  Metastatic colon cancer with liver mets follows with Dr. Earlie Server A. fib CHADS2 score >3 on Coumadin HFpEF HTN HLD BMI 39 COPD on 2 L of oxygen Recent diagnosis 6.2 X5.2X 5.7 cm enhancing lesion larger thought to be metastatic disease causing biliary obstruction status post ERCP asked to 11/16/2021  Presented to ED 12/27/2021 abdominal pain back pain nausea vomiting WBC 16 CT abdomen pelvis new liver mass concerning for progressive mets versus necrosis + pancreatitis-- Started on Unasyn, GI consulted--felt this was an abscess and would need IR consult INR reversed 12/28/2021 rapid response called patient in respiratory distress, She has been seen several times subsequently by rapid response and was noted to have hypercarbia She was transferred to stepdown 1/5 after detailed discussions with family Palliative care has been requested to weigh in further   Hospital-Problem based course  Toxic metabolic encephalopathy likely combination of hypercarbia as well as polypharmacy with pain meds  Patient is more coherent but still little confused Continue if she allows BiPAP Hopeful for recovery-see below sepsis secondary to possible liver abscess Klebsiella and  step Anginosis in Abscess culture l Occluded stent Pancreatitis Discussed with gastroenterology-possibility of occluded stent causing sepsis however patient is not stable for ERCP CT liver drain abscess performed with 10 French catheter-cultures gram-positive cocci few gram-negative- Unasyn changed to ceftriaxone /flagyll  Blood cultures from 12/27/2021 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 fib RVR CHADS2 score >2--supratherapeutic on Coumadin Continue metoprolol 50 twice daily, as needed Toprol injection 2.5 every 4 as needed heart rate >130  Increase Cardizem 120 CD-->180 INR therapeutic would start Lovenox if drops below 2 Hypotension in the setting of prior hypertension Amlodipine on hold COPD on 2 L oxygen at home Hold all oral meds Continue albuterol 3 mils every 6 as needed Hypoalbuminemia Supplement when able Class II obesity Prior breast cancer Hold letrozole 2.5 daily Goals of care--seen previously by palliative care is currently DNR as per discussion 12/28/2021  DVT prophylaxis: Supratherapeutic on Coumadin Code Status: DNR confirmed Family Communication: Long discussion on telephone with grandson Will at [phone] updated in full  Last on 1/7 Disposition:  Status is: Inpatient  Remains inpatient appropriate because: Ill     Consultants:  Gastroenterology  Procedures: Biopsy with drain placement and ERCP  1/4  Antimicrobials: IV Unasyn   Subjective:  Confused when I saw her No distress, but cannot really get a clear h/o  Nursing reports declining Bipap Sh tells me " I want to die"  Objective: Vitals:   01/03/22 0906 01/03/22 0930 01/03/22 1000 01/03/22 1212  BP: 129/68  (!) 120/52   Pulse: (!) 53 (!) 101 (!) 102   Resp: 16  (!) 22   Temp:    97.6 F (36.4 C)  TempSrc:    Oral  SpO2: 92%  95%   Weight:      Height:        Intake/Output Summary (Last 24 hours) at 01/03/2022 1403 Last data filed  at 01/03/2022 1000 Gross per 24 hour  Intake 2253.45 ml  Output 720 ml  Net 1533.45 ml    Filed Weights   01/01/22 0500 01/02/22 0400 01/03/22 0411  Weight: 115.5 kg 116.8 kg 117 kg    Examination:  Awake more  interactive,  Eomi ncat Tachycardic 90's Distended abdomen grade 3/4 edema --Drain in LUQ Posterior lung fields not examined cannot appreciate flap today--power 5/5 but overal limited by effort  Data Reviewed: personally reviewed   CBC    Component Value Date/Time   WBC 12.8 (H) 01/03/2022 0416   RBC 3.98 01/03/2022 0416   HGB 10.7 (L) 01/03/2022 0416   HGB 12.9 12/07/2021 1305   HGB 13.2 11/24/2021 1148   HGB 13.0 12/22/2017 1013   HCT 35.5 (L) 01/03/2022 0416   HCT 41.1 11/24/2021 1148   HCT 41.8 12/22/2017 1013   PLT 306 01/03/2022 0416   PLT 254 12/07/2021 1305   PLT 214 11/24/2021 1148   MCV 89.2 01/03/2022 0416   MCV 85 11/24/2021 1148   MCV 92.1 12/22/2017 1013   MCH 26.9 01/03/2022 0416   MCHC 30.1 01/03/2022 0416   RDW 16.2 (H) 01/03/2022 0416   RDW 13.7 11/24/2021 1148   RDW 14.6 (H) 12/22/2017 1013   LYMPHSABS 0.5 (L) 01/03/2022 0416   LYMPHSABS 0.9 11/24/2021 1148   LYMPHSABS 1.0 12/22/2017 1013   MONOABS 0.4 01/03/2022 0416   MONOABS 0.4 12/22/2017 1013   EOSABS 0.0 01/03/2022 0416   EOSABS 0.4 11/24/2021 1148   BASOSABS 0.1 01/03/2022 0416   BASOSABS 0.1 11/24/2021 1148   BASOSABS 0.0 12/22/2017 1013   CMP Latest Ref Rng & Units 01/03/2022 01/01/2022 12/31/2021  Glucose 70 - 99 mg/dL 133(H) 118(H) 153(H)  BUN 8 - 23 mg/dL 52(H) 76(H) 77(H)  Creatinine 0.44 - 1.00 mg/dL 1.74(H) 2.16(H) 2.24(H)  Sodium 135 - 145 mmol/L 148(H) 145 141  Potassium 3.5 - 5.1 mmol/L 3.8 3.9 4.0  Chloride 98 - 111 mmol/L 112(H) 108 106  CO2 22 - 32 mmol/L 27 26 27   Calcium 8.9 - 10.3 mg/dL 8.2(L) 8.5(L) 8.3(L)  Total Protein 6.5 - 8.1 g/dL - 6.3(L) 6.1(L)  Total Bilirubin 0.3 - 1.2 mg/dL - 1.7(H) 2.5(H)  Alkaline Phos 38 - 126 U/L - 190(H) 195(H)  AST 15 - 41 U/L - 30 38  ALT 0 - 44 U/L - 23 27     Radiology Studies: No results found.   Scheduled Meds:  chlorhexidine  15 mL Mouth Rinse BID   Chlorhexidine Gluconate Cloth  6 each Topical Daily   diltiazem  120 mg Oral  Daily   famotidine  20 mg Oral Daily   lactulose  10 g Oral Daily   mouth rinse  15 mL Mouth Rinse q12n4p   metoprolol tartrate  50 mg Oral BID   sodium chloride flush  10-40 mL Intracatheter Q12H   sodium chloride flush  5 mL Intracatheter Q8H   valACYclovir  500 mg Oral BID   Continuous Infusions:  sodium chloride Stopped (12/30/21 2353)   cefTRIAXone (ROCEPHIN)  IV Stopped (01/02/22 1807)   lactated ringers 75 mL/hr at 01/03/22 1000   metronidazole Stopped (01/03/22 1029)     LOS: 7 days   40 minutes  Nita Sells, MD [facility] To contact the attending provider between 7A-7P or the covering provider during after hours 7P-7A, please log into the web site [email or web address] and access using universal [place] password for that web site. If you do not have  the password, please call the hospital operator.  01/03/2022, 2:03 PM

## 2022-01-03 NOTE — Progress Notes (Signed)
PMT no charge note.    Received call from nursing colleague Thomasena Edis that patient's family is at bedside and that they wished to honor the patient's wishes and proceed with comfort measures.  Call placed into the patient's room and discussed with grand son Mr Marijean Bravo who re iterated the above.  Plan: Comfort measures only Chaplain consult for additional support.  Anticipate hospital death. D/C labs and interventions not directly contributing to comfort Add low dose IV Dilaudid PRN and monitor  Will continue antibiotics for now, as a symptom management measures, can d/c in the near future, PMT to follow.  No charge Loistine Chance MD [place] [facility].

## 2022-01-04 ENCOUNTER — Other Ambulatory Visit (HOSPITAL_COMMUNITY): Payer: Self-pay

## 2022-01-04 LAB — AEROBIC/ANAEROBIC CULTURE W GRAM STAIN (SURGICAL/DEEP WOUND): Gram Stain: NONE SEEN

## 2022-01-04 LAB — CYTOLOGY - NON PAP

## 2022-01-04 MED ORDER — HYOSCYAMINE SULFATE 0.125 MG SL SUBL
0.1250 mg | SUBLINGUAL_TABLET | SUBLINGUAL | 0 refills | Status: DC | PRN
  Filled 2022-01-04: qty 42, 7d supply, fill #0

## 2022-01-04 MED ORDER — MORPHINE SULFATE (CONCENTRATE) 20 MG/ML PO SOLN
10.0000 mg | ORAL | 0 refills | Status: DC | PRN
  Filled 2022-01-04: qty 30, 10d supply, fill #0

## 2022-01-04 MED ORDER — SODIUM CHLORIDE 0.9 % IV SOLN: 10.0000 mL | INTRAVENOUS | 0 refills | Status: AC | PRN

## 2022-01-04 NOTE — Discharge Summary (Signed)
Physician Discharge Summary  Tammie [name] YCX:[id number] DOB: 1941-11-03 DOA: 12/26/2021  PCP: [name], FNP  Admit date: 12/26/2021 Discharge date: 01/04/2022  Time spent: 36 minutes  Recommendations for Outpatient Follow-up:  Patient being discharged with hospice at home with a guarded prognosis  Discharge Diagnoses:  MAIN problem for hospitalization   Sepsis secondary to biliary obstruction in the setting of metastatic colon cancer and multiple other comorbid's  Please see below for itemized issues addressed in [place]- refer to other progress notes for clarity if needed  Discharge Condition: Guarded  Diet recommendation: Comfort  Filed Weights   01/02/22 0400 01/03/22 0411 01/04/22 0500  Weight: 116.8 kg 117 kg 119.3 kg    History of present illness:  81 year old female at baseline uses cane to ambulate Known history of invasive ductal breast  2012 prior VATS NSCLC lung  Metastatic colon cancer with liver mets follows with Dr. Earlie Server A. fib CHADS2 score >3 on Coumadin HFpEF HTN HLD BMI 39 COPD on 2 L of oxygen Recent diagnosis 6.2 X5.2X 5.7 cm enhancing lesion larger thought to be metastatic disease causing biliary obstruction status post ERCP asked to 11/16/2021   Presented to ED 12/27/2021 abdominal pain back pain nausea vomiting WBC 16 CT abdomen pelvis new liver mass concerning for progressive mets versus necrosis + pancreatitis-- Started on Unasyn, GI consulted--felt this was an abscess and would need IR consult INR reversed 12/28/2021 rapid response called patient in respiratory distress, She has been seen several times subsequently by rapid response and was noted to have hypercarbia She was transferred to stepdown 1/5 after detailed discussions with family  She was eventually found to have Klebsiella and strep angina gnosis in her abscess and she was treated with IV ceftriaxone and Flagyl She developed toxic metabolic encephalopathy secondary to  hypercarbia polypharmacy and hyperammonemia problem her probable liver metastases Ultimately she was seen by palliative care in detail discussions were undertaken with patient and family and when she was more coherent she stated she did not want to continue aggressive medical care She wished to go home on discharge with home hospice and equipment was ordered and scripts were filled out for her to do so This was communicated to family who will accept her at home  Discharge Exam: Vitals:   01/04/22 1000 01/04/22 1100  BP:    Pulse: 87 99  Resp: (!) 22 17  Temp:    SpO2: 99% 99%    Subj on day of d/c   More coherent no distress eating some no chest pain no fever  General Exam on discharge  EOMI NCAT no focal deficit More awake alert no flap Chest clear no added sound no rales or rhonchi Abdomen obese nontender Lower extremity edema present S1-S2 tachycardic ROM intact  Discharge Instructions   Discharge Instructions     Diet - low sodium heart healthy   Complete by: As directed    Increase activity slowly   Complete by: As directed    No wound care   Complete by: As directed    Oxygen therapy   Complete by: As directed    PRN. When the patient has dyspnea with hypoxia (oxygen saturations 88% or below), via nasal cannula, 2L/minute. Keep O2 sat >88%      Allergies as of 01/04/2022       Reactions   Tape Hives   Contrast Media [iodinated Contrast Media] Hives   Iohexol Other (See Comments)    Code: HIVES, Desc: hives w/ contrast '  08// better w/ benadryl   Sulfa Antibiotics Other (See Comments)   REACTION: unknown   Sulfamethoxazole-trimethoprim Other (See Comments)   REACTION: unknown        Medication List     STOP taking these medications    albuterol 108 (90 Base) MCG/ACT inhaler Commonly known as: VENTOLIN HFA   amLODipine 10 MG tablet Commonly known as: NORVASC   famotidine 20 MG tablet Commonly known as: PEPCID   Fish Oil 1000 MG Caps    FLAXSEED OIL PO   furosemide 20 MG tablet Commonly known as: LASIX   Garlic 1610 MG Caps   Iron 142 (45 Fe) MG Tbcr   letrozole 2.5 MG tablet Commonly known as: FEMARA   metoprolol tartrate 100 MG tablet Commonly known as: LOPRESSOR   metoprolol tartrate 50 MG tablet Commonly known as: LOPRESSOR   pantoprazole 40 MG tablet Commonly known as: PROTONIX   polyethylene glycol 17 g packet Commonly known as: MiraLax   prochlorperazine 10 MG tablet Commonly known as: COMPAZINE   promethazine 25 MG tablet Commonly known as: PHENERGAN   simvastatin 10 MG tablet Commonly known as: ZOCOR   valACYclovir 500 MG tablet Commonly known as: VALTREX   warfarin 2.5 MG tablet Commonly known as: COUMADIN       TAKE these medications    acetaminophen 650 MG CR tablet Commonly known as: TYLENOL Take 650 mg by mouth 3 (three) times daily as needed for pain.   hyoscyamine 0.125 MG SL tablet Commonly known as: LEVSIN SL Place 1 tablet (0.125 mg total) under the tongue every 4 (four) hours as needed (excess oral secretions).   morphine 20 MG/ML concentrated solution Commonly known as: ROXANOL Take 0.5 mLs (10 mg total) by mouth every 4 (four) hours as needed for severe pain. May give sublingually if needed.   sodium chloride 0.9 % infusion Inject 10 mLs into the vein as needed (for administration of IV medications (carrier fluid)).       Allergies  Allergen Reactions   Tape Hives   Contrast Media [Iodinated Contrast Media] Hives   Iohexol Other (See Comments)     Code: HIVES, Desc: hives w/ contrast '08// better w/ benadryl    Sulfa Antibiotics Other (See Comments)    REACTION: unknown   Sulfamethoxazole-Trimethoprim Other (See Comments)    REACTION: unknown      The results of significant diagnostics from this hospitalization (including imaging, microbiology, ancillary and laboratory) are listed below for reference.    Significant Diagnostic Studies: CT ABDOMEN  PELVIS WO CONTRAST  Result Date: 12/26/2021 CLINICAL DATA:  Prior history of lung, colon and breast cancer, presents today with abdominal pain and low back pain. EXAM: CT ABDOMEN AND PELVIS WITHOUT CONTRAST TECHNIQUE: Multidetector CT imaging of the abdomen and pelvis was performed following the standard protocol without IV contrast. COMPARISON:  There are numerous prior CTs. The 2 most recent are noncontrast CTs dated 11/12/2021 and 08/04/2021 FINDINGS: Lower chest: A subsolid posterior pleural-based right lower lobe pulmonary nodule measures 11.3 mm today, on 11/12/2021 measuring 9.5 mm with surrounding haziness. Lung bases show scattered scar-like opacities without further significant findings. There are postsurgical changes in the outer right breast and dystrophic calcifications in both breasts. Hepatobiliary: There has been interval stenting of the right and left intrahepatic main ducts, with the left sided stent extending down the CBD into the duodenum. In left lobe lateral segment there is new demonstration of a sizable heterogeneous mass measuring 8.4 x 5.4 cm on series  1 axial 17 with scattered fluid pockets, largest individual fluid pocket approaching 4 cm and could represent a multilocular abscess or metastatic liver disease. Infectious process may be favored given that the last study was only about 6 weeks ago. There is additional new rounded low-density lesion more laterally within this segment measuring 3.0 cm and 9.6 Hounsfield units and a smaller low-density lesion more inferiorly in this segment measuring 1.2 cm and 16.2 Hounsfield units. There is still mild intrahepatic biliary dilatation in both lobes but it is less pronounced than previously. An ill-defined presumably conglomerate nodal mass at the porta hepatis today is estimated to measure 5.0 x 3.9 cm and is difficult to separate from the descending duodenum which it abuts at its posterior edge. Previously the mass measured about 4.6 x 2.9  cm. The right lobe demonstrated no focal mass, as visualized without the use of IV contrast. Gallbladder contains a single 2.5 cm stone is distended without wall thickening or pericholecystic edema. Pancreas: Today there is increased pancreatic edema with mild peripancreatic edema, findings consistent with likely interstitial pancreatitis. No pancreatic ductal dilatation is seen. Laboratory and clinical correlation suggested. No pancreatic mass is seen without contrast. Spleen: Slightly prominent but otherwise unremarkable without contrast. Adrenals/Urinary Tract: There is no adrenal mass. Renal cysts are again noted, largest on the left measuring up to nearly 9 cm in the upper pole with 4.7 cm cyst anterior to this. There is a 3 mm nonobstructing caliceal stone in the inferior pole left kidney. Again noted is a right renal pelvis stone measuring 1.3 x 0.7 cm. There is a 1 cm low-density lesion of the posterior cortex of the right kidney measuring 45 Hounsfield units which has been noted on multiple prior studies. There is no obstructing urinary stone or hydronephrosis. The bladder is contracted and not well seen. Stomach/Bowel: No dilatation or wall thickening including the appendix. Uncomplicated sigmoid diverticulosis. Vascular/Lymphatic: Conglomerate nodal mass described above in the porta hepatis. There is aortoiliac atherosclerosis without further visible adenopathy. Reproductive: The uterus is absent.  No adnexal mass is seen. Other: Small umbilical and inguinal fat hernias. No free air, hemorrhage or fluid. Musculoskeletal: Osteopenia and degenerative change of the spine. Slight lumbar levoscoliosis. No worrisome regional skeletal lesion. IMPRESSION: 1. 8.4 x 5.4 cm heterogeneous mass in the left lobe of the liver in the lateral segment with fluid pockets measuring up to nearly 4 cm. This could be a metastasis with areas of internal necrosis or an abscess with multiple locules. Infectious process is favored  given the relatively short interval time frame of the appearance of this since the last study. 2. An additional 3.0 cm low-density lesion more laterally within the segment is noted as well as a 1.2 cm low-density lesion more inferiorly in this segment, as above could be metastases or due to infection. 3. Interval biliary stenting. There is still mild intrahepatic biliary prominence but less pronounced than previously. The gallbladder is distended and again contains a 2.5 cm stone but there is no appreciable wall thickening. 4. 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 No other adenopathy is visible. This does abut the superior aspect of the pancreatic head and neck junction but probably does not arise from the pancreas. Evaluation limited without IV contrast. 5. Evidence of acute interstitial pancreatitis. Laboratory and clinical correlation suggested. No peripancreatic fluid collections. 6. Bilateral nephrolithiasis and cysts. 7. Subsolid right lower lobe pleural-based nodule slightly larger than previously. Electronically Signed   By:  Telford Nab M.D.   On: 12/26/2021 22:36   CT L-SPINE NO CHARGE  Result Date: 12/26/2021 CLINICAL DATA:  Initial evaluation for acute lower back pain. EXAM: CT LUMBAR SPINE WITHOUT CONTRAST TECHNIQUE: Multidetector CT imaging of the lumbar spine was performed without intravenous contrast administration. Multiplanar CT image reconstructions were also generated. COMPARISON:  None available. FINDINGS: Segmentation: Standard. Lowest well-formed disc space labeled the L5-S1 level. Alignment: Mild levoscoliosis with apex at L2-3. Alignment otherwise normal with preservation of the normal lumbar lordosis. No listhesis. Vertebrae: Vertebral body height maintained without acute or chronic fracture. Visualized sacrum and pelvis intact. SI joints symmetric and normal. No worrisome lytic or blastic osseous lesions. Paraspinal and other soft tissues:  Paraspinous soft tissues demonstrate no acute finding. Advanced aorto bi-iliac atherosclerotic disease noted. Probable large exophytic left renal cyst partially visualized. 4 mm nonobstructive left renal calculus noted. 1.3 cm stone positioned within the right renal pelvis with associated localized pelviectasis. No overt hydronephrosis. Additional note made of a approximate 1 cm exophytic hyperdense lesion extending from the posterior aspect of the interpolar right kidney, nonspecific, but likely a proteinaceous and/or hemorrhagic cyst. Disc levels: T12-L1: Negative interspace. Mild facet hypertrophy. No stenosis. L1-2: Degenerative intervertebral disc space narrowing with disc desiccation and diffuse disc bulge, slightly eccentric to the left. Mild facet hypertrophy. No spinal stenosis. Foramina remain patent. L2-3: Mild intervertebral disc space narrowing with diffuse disc bulge and disc desiccation. Disc bulging slightly asymmetric to the right. Mild facet hypertrophy. Resultant mild canal with mild right worse than left lateral recess stenosis. Mild right L2 foraminal narrowing. Left neural foramina remains patent. L3-4: Mild right eccentric disc bulge. Moderate right worse than left facet hypertrophy. Resultant mild canal with bilateral lateral recess stenosis, worse on the right. Mild right L3 foraminal narrowing. Left neural foramina remains patent. L4-5: Diffuse disc bulge, asymmetric to the right. Moderate facet and ligament flavum hypertrophy. Resultant moderate to severe canal with bilateral subarticular stenosis. Moderate left L4 foraminal narrowing. Right neural foramina remains patent. L5-S1: Minimal disc bulge. Moderate facet hypertrophy. No significant spinal stenosis. Foramina remain patent. IMPRESSION: 1. No acute abnormality within the lumbar spine. 2. Multilevel degenerative spondylosis and facet hypertrophy. Findings most pronounced at L4-5 where there is resultant resulting in moderate to  severe canal with bilateral subarticular stenosis, with moderate left L4 foraminal narrowing. 3. Bilateral nephrolithiasis, with 1.3 cm stone positioned within the right renal pelvis. Associated localized pelviectasis without overt hydronephrosis. 4. Aortic Atherosclerosis (ICD10-I70.0). Electronically Signed   By: Jeannine Boga M.D.   On: 12/26/2021 22:36   DG CHEST PORT 1 VIEW  Result Date: 12/31/2021 CLINICAL DATA:  Follow-up pneumonia EXAM: PORTABLE CHEST 1 VIEW COMPARISON:  Chest radiograph 12/28/2021 FINDINGS: The right chest wall port is in stable position. The heart is enlarged, unchanged. The mediastinal contours are prominent but unchanged. There there are probable trace bilateral pleural effusions with mild adjacent atelectasis. There is no pulmonary edema. There is no pneumothorax. The bones are stable. IMPRESSION: Trace bilateral pleural effusions with mild adjacent atelectasis. Electronically Signed   By: Valetta Mole M.D.   On: 12/31/2021 08:01   DG Chest Port 1 View  Result Date: 12/28/2021 CLINICAL DATA:  Pt c/o left sided back pain, especially with movement, sob, weakness. EXAM: PORTABLE CHEST 1 VIEW COMPARISON:  09/16/2020 FINDINGS: Cardiac silhouette is borderline enlarged. No convincing mediastinal or hilar masses. Right anterior chest wall Port-A-Cath is stable, tip in the lower superior vena cava. Lungs demonstrate diffuse bilateral interstitial prominence.  No lung consolidation. No convincing pleural effusion and no pneumothorax. Skeletal structures are grossly intact. IMPRESSION: 1. No acute cardiopulmonary disease. Electronically Signed   By: Lajean Manes M.D.   On: 12/28/2021 09:10   CT IMAGE GUIDED DRAINAGE BY PERCUTANEOUS CATHETER  Result Date: 12/30/2021 CLINICAL DATA:  History of lung, colon, and breast cancer. Obstructing mass at the hepatic hilum, now status post endoscopic stent placement across the biliary confluence. New abdominal pain, CT demonstrating new fluid  attenuation collections in the left lobe of the liver concerning for hepatic abscesses. Percutaneous drainage requested. EXAM: CT GUIDED DRAINAGE OF HEPATIC ABSCESS ANESTHESIA/SEDATION: Intravenous Fentanyl 60mcg and Versed 1.5mg  were administered as conscious sedation during continuous monitoring of the patient's level of consciousness and physiological / cardiorespiratory status by the radiology RN, with a total moderate sedation time of 18 minutes. PROCEDURE: The procedure, risks, benefits, and alternatives were explained to the patient. Questions regarding the procedure were encouraged and answered. The patient understands and consents to the procedure. Select axial scans through the liver were obtained. A new left subcapsular collection was identified. The dominant left hepatic lobe collection was localized and an appropriate skin entry site was determined and marked. The operative field was prepped with chlorhexidinein a sterile fashion, and a sterile drape was applied covering the operative field. A sterile gown and sterile gloves were used for the procedure. Local anesthesia was provided with 1% Lidocaine. Under CT fluoroscopic guidance, 18 gauge trocar needle advanced into the dominant left hepatic lobe collection. Greenish purulent material could be aspirated. Amplatz wire formed centrally within the cavity. The tract was dilated to facilitate placement of a 10 French pigtail drain catheter, formed centrally within the collection, position confirmed on CT. Proximally 20 mL of green purulent material were aspirated, sent for Gram stain and culture. The catheter was secured externally with 0 Prolene suture and StatLock and placed to gravity drain bag. The patient tolerated the procedure well. COMPLICATIONS: None immediate FINDINGS: Multiple low-attenuation collections in the left hepatic lobe were localized. There is also anterior subcapsular collection involving hepatic segments 2 and 3. purulent appearing  material was aspirated from the dominant left hepatic lobe collection. 10 French pigtail drain catheter placed as above. IMPRESSION: 1. Technically successful CT-guided hepatic abscess drain catheter placement. Electronically Signed   By: Lucrezia Europe M.D.   On: 12/30/2021 15:29    Microbiology: Recent Results (from the past 240 hour(s))  Culture, blood (single)     Status: Abnormal   Collection Time: 12/27/21 12:15 AM   Specimen: BLOOD  Result Value Ref Range Status   Specimen Description   Final    BLOOD PORTA CATH Performed at [facility] [address], [place], [place] [address]    Special Requests   Final    BOTTLES DRAWN AEROBIC AND ANAEROBIC Blood Culture adequate volume Performed at [facility] [address], [place], [place] [address]    Culture  Setup Time   Final    GRAM NEGATIVE COCCOBACILLI ANAEROBIC BOTTLE ONLY CRITICAL RESULT CALLED TO, READ BACK BY AND VERIFIED WITH: PHARMD NATHAN B. [id number]    Culture (A)  Final    FUSOBACTERIUM NUCLEATUM BETA LACTAMASE NEGATIVE Performed at [facility], [address] [place], [place] [address]    Report Status 01/01/2022 FINAL  Final  Blood Culture ID Panel (Reflexed)     Status: None   Collection Time: 12/27/21 12:15 AM  Result Value Ref Range Status   Enterococcus  faecalis NOT DETECTED NOT DETECTED Final   Enterococcus Faecium NOT DETECTED NOT DETECTED Final   Listeria monocytogenes NOT DETECTED NOT DETECTED Final   Staphylococcus species NOT DETECTED NOT DETECTED Final   Staphylococcus aureus (BCID) NOT DETECTED NOT DETECTED Final   Staphylococcus epidermidis NOT DETECTED NOT DETECTED Final   Staphylococcus lugdunensis NOT DETECTED NOT DETECTED Final   Streptococcus species NOT DETECTED NOT DETECTED Final   Streptococcus agalactiae NOT DETECTED NOT DETECTED Final   Streptococcus pneumoniae NOT DETECTED NOT DETECTED Final   Streptococcus pyogenes NOT DETECTED NOT  DETECTED Final   A.calcoaceticus-baumannii NOT DETECTED NOT DETECTED Final   Bacteroides fragilis NOT DETECTED NOT DETECTED Final   Enterobacterales NOT DETECTED NOT DETECTED Final   Enterobacter cloacae complex NOT DETECTED NOT DETECTED Final   Escherichia coli NOT DETECTED NOT DETECTED Final   Klebsiella aerogenes NOT DETECTED NOT DETECTED Final   Klebsiella oxytoca NOT DETECTED NOT DETECTED Final   Klebsiella pneumoniae NOT DETECTED NOT DETECTED Final   Proteus species NOT DETECTED NOT DETECTED Final   Salmonella species NOT DETECTED NOT DETECTED Final   Serratia marcescens NOT DETECTED NOT DETECTED Final   Haemophilus influenzae NOT DETECTED NOT DETECTED Final   Neisseria meningitidis NOT DETECTED NOT DETECTED Final   Pseudomonas aeruginosa NOT DETECTED NOT DETECTED Final   Stenotrophomonas maltophilia NOT DETECTED NOT DETECTED Final   Candida albicans NOT DETECTED NOT DETECTED Final   Candida auris NOT DETECTED NOT DETECTED Final   Candida glabrata NOT DETECTED NOT DETECTED Final   Candida krusei NOT DETECTED NOT DETECTED Final   Candida parapsilosis NOT DETECTED NOT DETECTED Final   Candida tropicalis NOT DETECTED NOT DETECTED Final   Cryptococcus neoformans/gattii NOT DETECTED NOT DETECTED Final    Comment: Performed at [facility], [address], [place], [place] [address]  Blood culture (routine x 2)     Status: None (Preliminary result)   Collection Time: 12/27/21 12:51 AM   Specimen: BLOOD  Result Value Ref Range Status   Specimen Description   Final    BLOOD RIGHT ANTECUBITAL Performed at [facility] [address], [place], [place] [address]    Special Requests   Final    BOTTLES DRAWN AEROBIC AND ANAEROBIC Blood Culture adequate volume Performed at [facility] [address] [place], [place] [address]    Culture  Setup Time   Final    GRAM POSITIVE RODS CRITICAL RESULT CALLED TO, READ BACK BY AND VERIFIED WITH:  PHARMD S CHRISTY [id number] AT 1319 BY CM CRITICAL RESULT CALLED TO, READ BACK BY AND VERIFIED WITH: PHARMD N.JOOJOVAC AT 1151 ON 01/01/2022 BY T.SAAD.    Culture   Final    GRAM POSITIVE RODS CULTURE REINCUBATED FOR BETTER GROWTH Performed at [facility], [place] [address], [place],  [address]    Report Status PENDING  Incomplete  Resp Panel by RT-PCR (Flu A&B, Covid) Nasopharyngeal Swab     Status: None   Collection Time: 12/27/21  6:46 AM   Specimen: Nasopharyngeal Swab; Nasopharyngeal(NP) swabs in vial transport medium  Result Value Ref Range Status   SARS Coronavirus 2 by RT PCR NEGATIVE NEGATIVE Final    Comment: (NOTE) SARS-CoV-2 target nucleic acids are NOT DETECTED.  The SARS-CoV-2 RNA is generally detectable in upper respiratory specimens during the acute phase of infection. The lowest concentration of SARS-CoV-2 viral copies this assay can detect is 138 copies/mL. A negative result does not preclude SARS-Cov-2 infection and should not  be used as the sole basis for treatment or other patient management decisions. A negative result may occur with  improper specimen collection/handling, submission of specimen other than nasopharyngeal swab, presence of viral mutation(s) within the areas targeted by this assay, and inadequate number of viral copies(<138 copies/mL). A negative result must be combined with clinical observations, patient history, and epidemiological information. The expected result is Negative.  Fact Sheet for Patients:  EntrepreneurPulse.com.au  Fact Sheet for Healthcare Providers:  IncredibleEmployment.be  This test is no t yet approved or cleared by the [place] FDA and  has been authorized for detection and/or diagnosis of SARS-CoV-2 by FDA under an Emergency Use Authorization (EUA). This EUA will remain  in effect (meaning this test can be used) for the duration of the COVID-19 declaration under Section  564(b)(1) of the Act, 21 U.S.C.section 360bbb-3(b)(1), unless the authorization is terminated  or revoked sooner.       Influenza A by PCR NEGATIVE NEGATIVE Final   Influenza B by PCR NEGATIVE NEGATIVE Final    Comment: (NOTE) The Xpert Xpress SARS-CoV-2/FLU/RSV plus assay is intended as an aid in the diagnosis of influenza from Nasopharyngeal swab specimens and should not be used as a sole basis for treatment. Nasal washings and aspirates are unacceptable for Xpert Xpress SARS-CoV-2/FLU/RSV testing.  Fact Sheet for Patients: EntrepreneurPulse.com.au  Fact Sheet for Healthcare Providers: IncredibleEmployment.be  This test is not yet approved or cleared by the [place] FDA and has been authorized for detection and/or diagnosis of SARS-CoV-2 by FDA under an Emergency Use Authorization (EUA). This EUA will remain in effect (meaning this test can be used) for the duration of the COVID-19 declaration under Section 564(b)(1) of the Act, 21 U.S.C. section 360bbb-3(b)(1), unless the authorization is terminated or revoked.  Performed at [facility], [place] [address], [place], [place] [address]   Aerobic/Anaerobic Culture w Gram Stain (surgical/deep wound)     Status: None (Preliminary result)   Collection Time: 12/30/21  2:32 PM   Specimen: Abscess  Result Value Ref Range Status   Specimen Description   Final    ABSCESS Performed at [place] [address] [place], [place] [address]    Special Requests   Final    NONE Performed at [facility], [place] [address], [place],  [address]    Gram Stain   Final    NO WBC SEEN MODERATE GRAM POSITIVE COCCI IN PAIRS FEW GRAM NEGATIVE RODS Performed at [facility], [place] [address], [place],  [address]    Culture   Final    ABUNDANT STREPTOCOCCUS ANGINOSIS RARE KLEBSIELLA OXYTOCA NO ANAEROBES ISOLATED; CULTURE IN  PROGRESS FOR 5 DAYS    Report Status PENDING  Incomplete   Organism ID, Bacteria STREPTOCOCCUS ANGINOSIS  Final   Organism ID, Bacteria KLEBSIELLA OXYTOCA  Final      Susceptibility   Klebsiella oxytoca - MIC*    AMPICILLIN >=32 RESISTANT Resistant     CEFAZOLIN <=4 SENSITIVE Sensitive     CEFEPIME <=0.12 SENSITIVE Sensitive     CEFTAZIDIME <=1 SENSITIVE Sensitive     CEFTRIAXONE <=0.25 SENSITIVE Sensitive     CIPROFLOXACIN <=0.25 SENSITIVE Sensitive     GENTAMICIN <=1 SENSITIVE Sensitive     IMIPENEM <=0.25 SENSITIVE Sensitive     TRIMETH/SULFA <=20 SENSITIVE Sensitive     AMPICILLIN/SULBACTAM 8 SENSITIVE Sensitive     PIP/TAZO <=4 SENSITIVE Sensitive     * RARE KLEBSIELLA OXYTOCA  Streptococcus anginosis - MIC*    PENICILLIN <=0.06 SENSITIVE Sensitive     CEFTRIAXONE 0.25 SENSITIVE Sensitive     ERYTHROMYCIN <=0.12 SENSITIVE Sensitive     LEVOFLOXACIN <=0.25 SENSITIVE Sensitive     VANCOMYCIN 0.5 SENSITIVE Sensitive     * ABUNDANT STREPTOCOCCUS ANGINOSIS  MRSA Next Gen by PCR, Nasal     Status: None   Collection Time: 12/31/21  1:08 PM   Specimen: Nasal Mucosa; Nasal Swab  Result Value Ref Range Status   MRSA by PCR Next Gen NOT DETECTED NOT DETECTED Final    Comment: (NOTE) The GeneXpert MRSA Assay (FDA approved for NASAL specimens only), is one component of a comprehensive MRSA colonization surveillance program. It is not intended to diagnose MRSA infection nor to guide or monitor treatment for MRSA infections. Test performance is not FDA approved in patients less than 26 years old. Performed at [facility], [place] [address], [place], [place] [address]      Labs: Basic Metabolic Panel: Recent Labs  Lab 12/29/21 0415 12/30/21 0412 12/31/21 0408 01/01/22 0502 01/03/22 0416  NA 138 141 141 145 148*  K 4.3 4.3 4.0 3.9 3.8  CL 104 105 106 108 112*  CO2 26 27 27 26 27   GLUCOSE 112* 122* 153* 118* 133*  BUN 57* 65* 77* 76* 52*  CREATININE 2.31*  2.30* 2.24* 2.16* 1.74*  CALCIUM 8.3* 8.4* 8.3* 8.5* 8.2*   Liver Function Tests: Recent Labs  Lab 12/29/21 0415 12/30/21 0412 12/31/21 0408 01/01/22 0502  AST 60* 50* 38 30  ALT 37 31 27 23   ALKPHOS 259* 217* 195* 190*  BILITOT 3.8* 4.6* 2.5* 1.7*  PROT 6.2* 6.4* 6.1* 6.3*  ALBUMIN 1.8* 1.7* 1.7* 1.7*   No results for input(s): LIPASE, AMYLASE in the last 168 hours. Recent Labs  Lab 12/31/21 0404  AMMONIA 37*   CBC: Recent Labs  Lab 12/29/21 0415 12/30/21 0412 12/31/21 0408 01/01/22 0502 01/03/22 0416  WBC 13.4* 13.6* 15.1* 12.5* 12.8*  NEUTROABS  --   --  13.9* 11.0* 11.2*  HGB 10.5* 10.3* 10.7* 10.6* 10.7*  HCT 34.4* 34.8* 34.8* 34.6* 35.5*  MCV 88.0 87.7 88.3 87.4 89.2  PLT 341 363 371 366 306   Cardiac Enzymes: No results for input(s): CKTOTAL, CKMB, CKMBINDEX, TROPONINI in the last 168 hours. BNP: BNP (last 3 results) Recent Labs    05/22/21 1153 11/16/21 0537  BNP 202.2* 395.9*    ProBNP (last 3 results) No results for input(s): PROBNP in the last 8760 hours.  CBG: Recent Labs  Lab 12/31/21 0151  GLUCAP 169*       Signed:  Nita Sells MD   [facility] 01/04/2022, 1:32 PM  Completed

## 2022-01-04 NOTE — Progress Notes (Signed)
Chaplain engaged in an initial visit with Tammie [name].  Chaplain checked in with her and [place]ill voiced that she was feeling really short of breath.  Chaplain could assess that she could not talk much due her breathing.  Chaplain offered to pray over her and she obliged.  Chaplain asked her what she would like to pray for and she stated, "Family."  Chaplain offered prayer over.  Chaplain also asked [place] if she was troubled in any way or if she felt at peace.  Tammie [name] stated, "Peace."    Chaplain offered support, presence, prayer and listening.    01/04/22 1000  Clinical Encounter Type  Visited With Patient  Visit Type Initial;Spiritual support  Spiritual Encounters  Spiritual Needs Prayer

## 2022-01-04 NOTE — TOC Progression Note (Addendum)
Transition of Care [facility]) - Progression Note    Patient Details  Name: Tammie [name] MRN: [id number] Date of Birth: [birth year]  Transition of [facility]) CM/SW Contact  Ross Ludwig, [place] Phone Number: 01/04/2022, 10:46 AM  Clinical Narrative:     CSW was informed by bedside nurse and attending physician that patient would like to go home with hospice services.  CSW contacted patient's grandson Tammie [name] and patient's other contact Tammie [name], they did not realize patient had decided to go home with hospice now.  CSW asked if they had a preference for agency, they want [facility] requested hospital bed, oxygen, and a bedside commode.  CSW contacted [facility] they Tammie [name] contact family and review patient and then call CSW back.  1:35pm CSW attempted to contact Tammie [name] from [place] to get an updated on equipment delivery and home hospice services.  CSW have to leave a message on voice mail.  CSW to continue to follow patient's progress throughout discharge planning.  4:14pm  CSW spoke to Tammie [name] at [facility]  Patient's hospital bed, mattress overlay, and oxygen Tammie [name] be delivered tomorrow.  Tammie [name] asked patient's grandson Tammie [name] to call once equipment is delivered and she Tammie [name] let TOC know so transportation can be arranged.  CSW updated attending physician and bedside nurse.      Expected Discharge Plan: Home w Hospice Care Barriers to Discharge: Continued Medical Work up  Expected Discharge Plan and Services Expected Discharge Plan: [place] Date [place]: 01/02/22 Time [place]: 1200     Social Determinants of Health (SDOH) Interventions    Readmission Risk Interventions No flowsheet data found.

## 2022-01-04 NOTE — Progress Notes (Signed)
Patient is Alert and oriented this morning, I spoke with her about going home with hospice and she did say she would be interested in doing so.

## 2022-01-04 NOTE — Progress Notes (Signed)
Referring Physician(s): A. Tuluksak PA-C  Supervising Physician: Juliet Rude  Patient Status:  [name] - In-pt  Chief Complaint:  F/U Drain  Brief History:  Tammie [name] is an 81 y.o. female with including breast, lung, and colon cancer with liver metastases,atrial fibrillation on coumadin, CKD stage IIIb, HTN and obesity.   She was recently  hospitalized with obstructive jaundice and underwent ERCP with biliary stenting.     She presented to the ED 12/26/21 with abdominal and back pain, nausea/vomiting and weakness.    CT Abdomen/pelvis w/out contrast 12/26/21 IMPRESSION: 1. 8.4 x 5.4 cm heterogeneous mass in the left lobe of the liver in the lateral segment with fluid pockets measuring up to nearly 4 cm. This could be a metastasis with areas of internal necrosis or an abscess with multiple locules. Infectious process is favored given the relatively short interval time frame of the appearance of this since the last study. 2. An additional 3.0 cm low-density lesion more laterally within the segment is noted as well as a 1.2 cm low-density lesion more inferiorly in this segment, as above could be metastases or due to infection. 3. Interval biliary stenting. There is still mild intrahepatic biliary prominence but less pronounced than previously. The gallbladder is distended and again contains a 2.5 cm stone but there is no appreciable wall thickening. 4. 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 No other adenopathy is visible. This does abut the superior aspect of the pancreatic head and neck junction but probably does not arise from the pancreas. Evaluation limited without IV contrast.   She underwent image-guided liver mass aspiration with drain placement on 12/30/21 by Dr. Vernard Gambles.  Cultures grew Streptococcus anginosis and Klebsiella oxytoca.  Subjective:  Lying in bed. Family at bedside.  No complaints.  Allergies: Tape,  Contrast media [iodinated contrast media], Iohexol, Sulfa antibiotics, and Sulfamethoxazole-trimethoprim  Medications: Prior to Admission medications   Medication Sig Start Date End Date Taking? Authorizing Provider  albuterol (VENTOLIN HFA) 108 (90 Base) MCG/ACT inhaler INHALE 2 PUFFS BY MOUTH EVERY 6 HOURS AS NEEDED FOR WHEEZING FOR SHORTNESS OF BREATH Patient taking differently: Inhale 2 puffs into the lungs every 6 (six) hours as needed for wheezing or shortness of breath. 12/22/21  Yes Hawks, Christy A, FNP  amLODipine (NORVASC) 10 MG tablet Take 1 tablet by mouth once daily Patient taking differently: Take 10 mg by mouth daily. 12/22/21  Yes Hawks, Christy A, FNP  Ferrous Sulfate (IRON) 142 (45 Fe) MG TBCR Take 142 mg by mouth every evening.   Yes [provider]  Flaxseed, Linseed, (FLAXSEED OIL PO) Take 1 tablet by mouth at bedtime.   Yes [provider]  furosemide (LASIX) 20 MG tablet Take 1 tablet by mouth twice daily Patient taking differently: Take 20 mg by mouth 2 (two) times daily. 12/22/21  Yes Hawks, Christy A, FNP  Garlic 9326 MG CAPS Take 1,000 mg by mouth daily.   Yes [provider]  letrozole (FEMARA) 2.5 MG tablet Take 1 tablet (2.5 mg total) by mouth daily. 07/28/21  Yes Nicholas Lose, MD  metoprolol tartrate (LOPRESSOR) 100 MG tablet Take 50 mg by mouth 2 (two) times daily.   Yes [provider]  Omega-3 Fatty Acids (FISH OIL) 1000 MG CAPS Take 1 capsule by mouth daily.   Yes [provider]  promethazine (PHENERGAN) 25 MG tablet Take 1 tablet (25 mg total) by mouth every 8 (eight) hours as  needed for nausea or vomiting. 12/20/21  Yes Curt Bears, MD  simvastatin (ZOCOR) 10 MG tablet TAKE 1 TABLET BY MOUTH ONCE DAILY 6 IN THE EVENING Patient taking differently: Take 10 mg by mouth daily at 6 PM. 12/15/21  Yes Hawks, Christy A, FNP  warfarin (COUMADIN) 2.5 MG tablet TAKE ONE TABLET BY MOUTH ONCE DAILY EXCEPT 1/2 TABLET ON  FRIDAYS. Patient taking differently: Take 2.5 mg by mouth daily. 08/13/21  Yes Hawks, Christy A, FNP  acetaminophen (TYLENOL) 650 MG CR tablet Take 650 mg by mouth 3 (three) times daily as needed for pain.  Patient not taking: Reported on 12/27/2021    [provider]  famotidine (PEPCID) 20 MG tablet Take 20 mg by mouth 2 (two) times daily. Patient not taking: Reported on 12/27/2021 11/27/21   [provider]  metoprolol tartrate (LOPRESSOR) 50 MG tablet Take 1 tablet (50 mg total) by mouth 2 (two) times daily. Patient not taking: Reported on 12/27/2021 11/16/21   Shelly Coss, MD  pantoprazole (PROTONIX) 40 MG tablet Take 1 tablet (40 mg total) by mouth daily. Patient not taking: Reported on 12/27/2021 11/17/21   Shelly Coss, MD  polyethylene glycol (MIRALAX) 17 g packet Take 17 g by mouth daily as needed. Patient not taking: Reported on 12/27/2021 11/16/21   Shelly Coss, MD  prochlorperazine (COMPAZINE) 10 MG tablet Take 1 tablet (10 mg total) by mouth every 6 (six) hours as needed for nausea or vomiting. Patient not taking: Reported on 12/27/2021 12/07/21   Curt Bears, MD  valACYclovir (VALTREX) 500 MG tablet Take 1 tablet (500 mg total) by mouth 2 (two) times daily. Patient not taking: Reported on 12/27/2021 12/07/21   Curt Bears, MD     Vital Signs: BP 125/69    Pulse 85    Temp 97.6 F (36.4 C) (Oral)    Resp 17    Ht 5\' 10"  (1.778 m)    Wt 119.3 kg    SpO2 97%    BMI 37.74 kg/m   Physical Exam Vitals reviewed.  Constitutional:      Appearance: She is obese.  Cardiovascular:     Rate and Rhythm: Normal rate.  Pulmonary:     Effort: Pulmonary effort is normal. No respiratory distress.  Abdominal:     Palpations: Abdomen is soft.     Comments: Upper abdominal drain in place.   Neurological:     General: No focal deficit present.     Mental Status: She is alert.  Psychiatric:        Mood and Affect: Mood normal.        Behavior: Behavior normal.   Drain Location: epigastric Size: Fr size: 10 Fr Date of placement: 12/30/2021  Currently to: Drain collection device: gravity 24 hour output:  Output by Drain (mL) 01/02/22 0701 - 01/02/22 1900 01/02/22 1901 - 01/03/22 0700 01/03/22 0701 - 01/03/22 1900 01/03/22 1901 - 01/04/22 0700 01/04/22 0701 - 01/04/22 1249  Closed System Drain 1 Medial RUQ Other (Comment) 10 Fr.  20  60     Current examination: Flushes/aspirates easily.  Insertion site unremarkable. Suture and stat lock in place. Dressed appropriately.    Imaging: No results found.  Labs:  CBC: Recent Labs    12/30/21 0412 12/31/21 0408 01/01/22 0502 01/03/22 0416  WBC 13.6* 15.1* 12.5* 12.8*  HGB 10.3* 10.7* 10.6* 10.7*  HCT 34.8* 34.8* 34.6* 35.5*  PLT 363 371 366 306    COAGS: Recent Labs    12/31/21 2016  01/01/22 0502 01/02/22 0430 01/03/22 0416  INR 3.2* 3.8* 3.9* 3.6*    BMP: Recent Labs    12/30/21 0412 12/31/21 0408 01/01/22 0502 01/03/22 0416  NA 141 141 145 148*  K 4.3 4.0 3.9 3.8  CL 105 106 108 112*  CO2 27 27 26 27   GLUCOSE 122* 153* 118* 133*  BUN 65* 77* 76* 52*  CALCIUM 8.4* 8.3* 8.5* 8.2*  CREATININE 2.30* 2.24* 2.16* 1.74*  GFRNONAA 21* 22* 23* 29*    LIVER FUNCTION TESTS: Recent Labs    12/29/21 0415 12/30/21 0412 12/31/21 0408 01/01/22 0502  BILITOT 3.8* 4.6* 2.5* 1.7*  AST 60* 50* 38 30  ALT 37 31 27 23   ALKPHOS 259* 217* 195* 190*  PROT 6.2* 6.4* 6.1* 6.3*  ALBUMIN 1.8* 1.7* 1.7* 1.7*    Assessment and Plan:  S/P hepatic drain placement by Dr. Vernard Gambles on 12/30/21  Continue TID flushes with 5 cc NS. Record output Q shift. Dressing changes QD or PRN if soiled.  Call IR APP or on call IR MD if difficulty flushing or sudden change in drain output.  Repeat imaging/possible drain injection once output < 10 mL/QD (excluding flush material.)  Discharge planning: Please contact IR APP or on call IR MD prior to patient d/c to ensure appropriate follow up plans are  in place. Typically patient will follow up with IR clinic 10-14 days post d/c for repeat imaging/possible drain injection. IR scheduler will contact patient with date/time of appointment. Patient will need to flush drain QD with 5 cc NS, record output QD, dressing changes every 2-3 days or earlier if soiled.   IR will continue to follow - please call with questions or concerns.   Electronically Signed: Murrell Redden, PA-C 01/04/2022, 10:56 AM    I spent a total of 15 Minutes at the the patient's bedside AND on the patient's hospital floor or unit, greater than 50% of which was counseling/coordinating care for f/u drain.

## 2022-01-04 NOTE — Progress Notes (Signed)
Daily Progress Note   Patient Name: Tammie [name]       Date: 01/04/2022 DOB: 1941/11/12  Age: 81 y.o. MRN#: [id number] Attending Physician: Nita Sells, MD Primary Care Physician: [name], FNP Admit Date: 12/26/2021  Reason for Consultation/Follow-up: Establishing goals of care  Subjective: Patient is awake alert resting in bed, on oxygen via nasal cannula, Mr. Alford Highland and other family members are present at the bedside.  Patient states she wants to go to her home and is accepting of hospice support, patient lives in [place], [place] we discussed about home with hospice arrangements.     Length of Stay: 8  Current Medications: Scheduled Meds:   chlorhexidine  15 mL Mouth Rinse BID   Chlorhexidine Gluconate Cloth  6 each Topical Daily   mouth rinse  15 mL Mouth Rinse q12n4p   sodium chloride flush  10-40 mL Intracatheter Q12H   sodium chloride flush  5 mL Intracatheter Q8H   valACYclovir  500 mg Oral BID    Continuous Infusions:  sodium chloride Stopped (12/30/21 2353)   cefTRIAXone (ROCEPHIN)  IV Stopped (01/03/22 1905)   dextrose 75 mL/hr at 01/04/22 0600   metronidazole Stopped (01/04/22 1058)    PRN Meds: sodium chloride, acetaminophen, albuterol, HYDROmorphone (DILAUDID) injection, lip balm, naLOXone (NARCAN)  injection, sodium chloride flush  Physical Exam         Awake and alert this morning, is able to verbalize and follow commands. On 2 L oxygen nasal cannula Slightly increased work of breathing using chest wall and abdominal muscles  Appears weak and chronically ill   Vital Signs: BP 125/69    Pulse 99    Temp 97.6 F (36.4 C) (Oral)    Resp 17    Ht 5\' 10"  (1.778 m)    Wt 119.3 kg    SpO2 99%    BMI 37.74 kg/m  SpO2: SpO2: 99 % O2  Device: O2 Device: Nasal Cannula O2 Flow Rate: O2 Flow Rate (L/min): 2 L/min  Intake/output summary:  Intake/Output Summary (Last 24 hours) at 01/04/2022 1331 Last data filed at 01/04/2022 0600 Gross per 24 hour  Intake 1157.38 ml  Output 910 ml  Net 247.38 ml    LBM: Last BM Date: 01/04/22 Baseline Weight: Weight: 117.9 kg Most recent weight: Weight: 119.3 kg  Palliative Assessment/Data:      Patient Active Problem List   Diagnosis Date Noted   Palliative care by specialist    Goals of care, counseling/discussion    Abdominal pain 12/27/2021   Obstructive jaundice due to cancer ([place]) 11/12/2021   Hyperbilirubinemia    Hypokalemia    Jaundice    Transaminitis    AKI (acute kidney injury) ([place])    Supratherapeutic INR    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 ([place] 09/22/2021   Cancer with unknown primary site [facility]) 09/22/2021   Encounter for therapeutic drug monitoring 08/06/2021   Anticoagulated 10/06/2020   Chronic diastolic CHF (congestive heart failure) ([place]) 09/14/2020   Acute respiratory failure with hypoxia ([place]) 10/15/2019   Port-A-Cath in place 05/18/2018   Genetic testing 07/13/2017   Primary adenocarcinoma of colon ([place]) 07/07/2017   Encounter for antineoplastic immunotherapy 12/17/2016   Liver abscess 10/10/2016   Neuropathy 07/26/2016   Antineoplastic chemotherapy induced anemia 03/24/2015   CKD (chronic kidney disease), stage III ([place]) 03/24/2015   Hyperlipidemia with target LDL less than 100 03/10/2015   Peripheral edema 03/10/2015   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 ([place]) 08/29/2014   Malignant neoplasm of upper-inner quadrant of left breast in female, estrogen receptor positive ([place]) 11/13/2013   COPD with chronic bronchitis ([place]) 03/13/2012   HTN (hypertension) 03/13/2012   Atrial fibrillation ([place]) 03/08/2012   Asthma 04/17/2011   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   DOE (dyspnea on exertion) 09/04/2010   Abnormal liver  diagnostic imaging 09/04/2010   History of CVA (cerebrovascular accident) 05/21/2009    Palliative Care Assessment & Plan   Patient Profile:    Assessment:  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  Chronic Coumadin anticoagulation for A. fib:   3.  History of breast and lung cancer 4.  COPD on home O2 at 2 L  Recommendations/Plan: Gradual progressive functional decline continues, patient now on comfort measures, patient expressed wishes to go home with hospice services, TOC following.      Code Status:    Code Status Orders  (From admission, onward)           Start     Ordered   12/28/21 0855  Do not attempt resuscitation (DNR)  Continuous       Question Answer Comment  In the event of cardiac or respiratory ARREST Do not call a code blue   In the event of cardiac or respiratory ARREST Do not perform Intubation, CPR, defibrillation or ACLS   In the event of cardiac or respiratory ARREST Use medication by any route, position, wound care, and other measures to relive pain and suffering. May use oxygen, suction and manual treatment of airway obstruction as needed for comfort.      12/28/21 0854           Code Status History     Date Active Date Inactive Code Status Order ID Comments User Context   12/27/2021 0156 12/28/2021 0854 Full Code [id number]  Barton Dubois, MD ED   11/12/2021 2007 11/16/2021 1832 Full Code [id number]  Marcelyn Bruins, MD ED   09/14/2020 0427 09/16/2020 1814 Full Code [id number]  Vianne Bulls, MD ED   10/15/2019 2353 10/16/2019 2157 Full Code [id number]  Vianne Bulls, MD Inpatient   05/27/2017 2112 05/30/2017  1414 Full Code [id number]  Toy Baker, MD Inpatient   07/15/2014 1256 07/20/2014 1801 Full Code [id number]  Caryn Bee Inpatient      Advance Directive Documentation    Flowsheet Row Most Recent Value  Type of Advance Directive Healthcare Power of Attorney, Living will  Pre-existing out of facility DNR order (yellow form or pink MOST form) --  "MOST" Form in Place? --       Prognosis:  Guarded   Discharge Planning: Likely home with hospice Care plan was discussed with Mr Alford Highland at bedside  Thank you for allowing the Palliative Medicine Team to assist in the care of this patient.   Time In: 12 Time Out: 12.35 Total Time 35 Prolonged Time Billed  no       Greater than 50%  of this time was spent counseling and coordinating care related to the above assessment and plan.  Loistine Chance, MD  Please contact Palliative Medicine Team phone at [phone] for questions and concerns.

## 2022-01-04 NOTE — Progress Notes (Signed)
Nutrition Brief Note  Patient seen by a RD for initial assessment on 1/2. Chart reviewed. Palliative Care following with most recent note entered 1/8 evening. Patient now transitioning to comfort care and Palliative Care note indicates hospital death anticipated.   No further nutrition interventions planned at this time. Please re-consult as needed.      Jarome Matin, MS, RD, LDN Inpatient Clinical Dietitian RD pager # available in [place]  After hours/weekend pager # available in [facility]

## 2022-01-05 LAB — PROTIME-INR
INR: 3.3 — ABNORMAL HIGH (ref 0.8–1.2)
Prothrombin Time: 33.9 seconds — ABNORMAL HIGH (ref 11.4–15.2)

## 2022-01-05 MED ORDER — ONDANSETRON HCL 4 MG/2ML IJ SOLN
4.0000 mg | Freq: Once | INTRAMUSCULAR | Status: AC
  Administered 2022-01-05: 4 mg via INTRAVENOUS
  Filled 2022-01-05: qty 2

## 2022-01-05 MED ORDER — MORPHINE SULFATE (CONCENTRATE) 10 MG/0.5ML PO SOLN
10.0000 mg | ORAL | Status: DC | PRN
  Administered 2022-01-05: 10 mg via ORAL
  Filled 2022-01-05: qty 0.5

## 2022-01-05 MED ORDER — HEPARIN SOD (PORK) LOCK FLUSH 100 UNIT/ML IV SOLN
500.0000 [IU] | INTRAVENOUS | Status: AC | PRN
  Administered 2022-01-05: 500 [IU]
  Filled 2022-01-05: qty 5

## 2022-01-05 NOTE — Progress Notes (Signed)
° °  No overall large changes from yesterday's exam-informed that patient was nauseous so ordered some Zofran Otherwise is stable for discharge to skilled facility  Verneita Griffes, MD [facility] 10:49 AM  No charge

## 2022-01-05 NOTE — TOC Transition Note (Addendum)
Transition of Care [facility]) - CM/SW Discharge Note   Patient Details  Name: Tammie [name] MRN: [id number] Date of Birth: 10/15/1941  Transition of Care [facility]) CM/SW Contact:  Ross Ludwig, LCSW Phone Number: 01/05/2022, 2:18 PM   Clinical Narrative:     12:30pm  CSW spoke to [place] at [facility] of [facility] to find out if she knows when equipment will be delivered today.  Per Cassandra, patient's equipment should be delivered between 2pm and 6pm.  CSW requested that she call this CSW once it has been confirmed that equipment is delivered.  Patient is on will call for [facility].  Patient will be discharging back home with hospice services through [facility].  4:23pm  CSW spoke to patient's grandson Will, he said that patient's oxygen and hospital bed have been delivered to the house.  CSW explained that patient had asked about going to a nursing home for hospice services, CSW explained to him and to patient that hospice would not cover cost of a nursing home.  If she went to a nursing home she would have to pay 8-10 thousand dollars a month.  Patient's grandson then clarified, what the patient was talking about is going to the hospice facility, [facility]    CSW explained to grandson, that the hospice facility is for patient's who may have less then two weeks left to live, and per palliative medicine patient is not there yet at this time.  CSW reiterated that the advantage of having hospice in the home is, they can monitor, and if she gets worse and meets criteria for the hospice facility they can prioritize her for placement.    CSW also told him that if patient improves enough and she chooses to end hospice services, they can assist with getting any home health set up if needed.  Patient's grandson expressed understanding, and mentioned that [place] [facility] has been very helpful and working with him through the patient's situation.  Patient's grandson Will  [phone] would like to be called once EMS arrives.  CSW updated bedside nurse and contacted [facility] for EMS transport.   Final next level of care: Home w Hospice Care Barriers to Discharge: Barriers Resolved   Patient Goals and CMS Choice Patient states their goals for this hospitalization and ongoing recovery are:: To go home with hospice services. CMS Medicare.gov Compare Post Acute Care list provided to:: Patient Choice offered to / list presented to : Patient  Discharge Placement  Home with hospice services.                     Discharge Plan and [place] with hospice services.              DME Arranged: Hospital bed, Oxygen [facility]) DME Agency: Other - Comment Date DME Agency Contacted: 01/05/22 Time DME Agency Contacted: 7 Representative spoke with at [facility]: [place] Date [place]: 01/02/22 Time [place]: 48    Social Determinants of Health (SDOH) Interventions     Readmission Risk Interventions No flowsheet data found.

## 2022-01-05 NOTE — Discharge Summary (Incomplete)
Patient was discharged from [facility] ICU room 1240 at approximately 2337 with PETR via gurney. PT discharge AVR printed and sent with EMS, patient vital signs stable at this time. All personal belongings collected and transported with the patient. Pt grandson notified that patient would be leaving the hospital per his request.

## 2022-01-06 ENCOUNTER — Telehealth: Payer: Self-pay | Admitting: Radiation Oncology

## 2022-01-06 ENCOUNTER — Other Ambulatory Visit: Payer: Self-pay | Admitting: *Deleted

## 2022-01-06 MED ORDER — MORPHINE SULFATE (CONCENTRATE) 20 MG/ML PO SOLN: 10.0000 mg | ORAL | 0 refills | Status: AC | PRN

## 2022-01-06 MED ORDER — HYOSCYAMINE SULFATE 0.125 MG SL SUBL: 0.1250 mg | SUBLINGUAL_TABLET | SUBLINGUAL | 0 refills | Status: AC | PRN

## 2022-01-06 NOTE — Progress Notes (Signed)
I have sent refills to your pharmacy. Since on hospice, it appears they have d/c her warfarin. She no longer needs to check INR.

## 2022-01-06 NOTE — Progress Notes (Signed)
Pt is in her home - on Hospice as of today - these meds were ordered by [facility] and not picked up by family   Will need these asap = pt will now have to use [facility] for [place] - they will deliver to them     Please call hospice nurse, [name] [phone] when it has been sent

## 2022-01-06 NOTE — Progress Notes (Signed)
PT INR came in as well:   Fax received mdINR PT/INR self testing service Test date/time 01/06/22 @ 1:17 pm  INR 3.3 (range is 2-3)

## 2022-01-06 NOTE — Telephone Encounter (Signed)
I called the patient after she notified our staff that she was not interested in radiation treatment. It appears she was just discharged from the hospital after an abdominal absecess, metabolic encephalopathy and she has since enrolled in hospice care at home. She affirmed this today by phone and was alert and aware and clearly able to make this decision in my opinion. I will cancel her upcoming appointments in our department. I encouraged her to let us know if something changed or she desired re-evaluation regarding radiation.

## 2022-01-06 NOTE — Progress Notes (Signed)
Called beth the nurse and VM is full

## 2022-01-07 ENCOUNTER — Inpatient Hospital Stay: Payer: HMO | Admitting: Internal Medicine

## 2022-01-07 ENCOUNTER — Telehealth: Payer: Self-pay | Admitting: Medical Oncology

## 2022-01-07 NOTE — Telephone Encounter (Signed)
Pt requested  to cancel her upcoming appt.  Done. Pt requests copies of her path reports for her cancer policy.  Mailed to pt.

## 2022-01-08 LAB — MISC LABCORP TEST (SEND OUT)
LabCorp test name: 182345
Labcorp test code: 182345

## 2022-01-13 ENCOUNTER — Telehealth: Payer: Self-pay | Admitting: Family

## 2022-01-13 ENCOUNTER — Other Ambulatory Visit: Payer: Self-pay | Admitting: Nurse Practitioner

## 2022-01-13 MED ORDER — MELATONIN 10 MG PO TABS: 10.0000 mg | ORAL_TABLET | Freq: Every day | ORAL | 1 refills | Status: DC

## 2022-01-13 NOTE — Telephone Encounter (Signed)
Patient is in Hospice care and needs an order for Melatonin to help her sleep.

## 2022-01-14 LAB — CULTURE, BLOOD (ROUTINE X 2): Special Requests: ADEQUATE

## 2022-01-14 MED ORDER — MELATONIN 10 MG PO TABS
10.0000 mg | ORAL_TABLET | Freq: Every day | ORAL | 1 refills | Status: AC
Start: 2022-01-14 — End: ?

## 2022-01-14 NOTE — Telephone Encounter (Signed)
Resent to [employer] [facility]

## 2022-01-27 DEATH — deceased

## 2022-01-29 ENCOUNTER — Other Ambulatory Visit: Payer: Self-pay | Admitting: Family

## 2022-02-17 IMAGING — CT CT ABD-PELV W/O CM
2 of 4 series · 13 of 36 positions shown, 16 images · non-contrast
Comparison: 03/31/2020

CLINICAL DATA: Non-small cell lung cancer staging, assess treatment
response, history of left lung cancer, right breast cancer, and
colorectal cancer with liver metastases

EXAM:
CT CHEST, ABDOMEN AND PELVIS WITHOUT CONTRAST
TECHNIQUE: Multidetector CT imaging of the chest, abdomen and pelvis was
performed following the standard protocol without IV contrast.
Additional oral enteric contrast was administered

[Series 2: cap w/o · axial · non-contrast · 0.98mm/px · z∈[-620,-90]mm · 10 of 128 slices shown, 13 images]
[im 11/128  mediastinal]
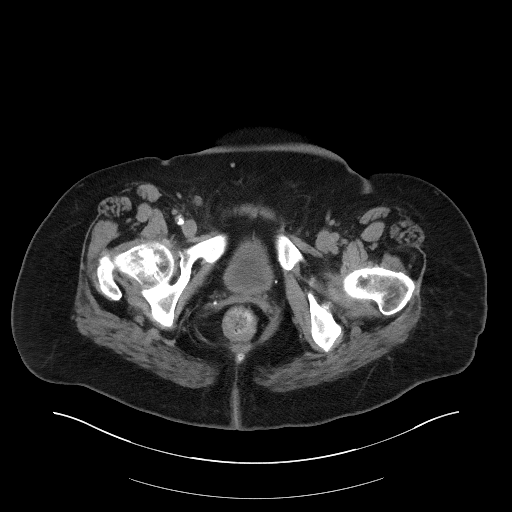
[im 11/128  lung]
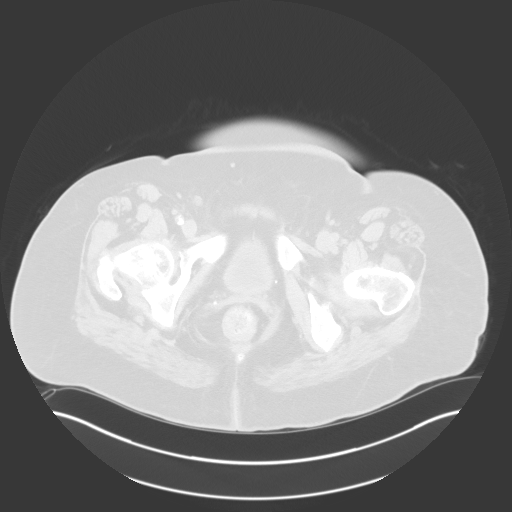
[im 22/128  lung]
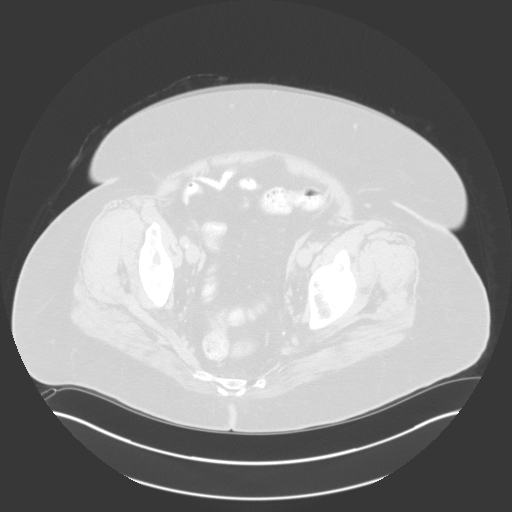
[im 32/128  lung]
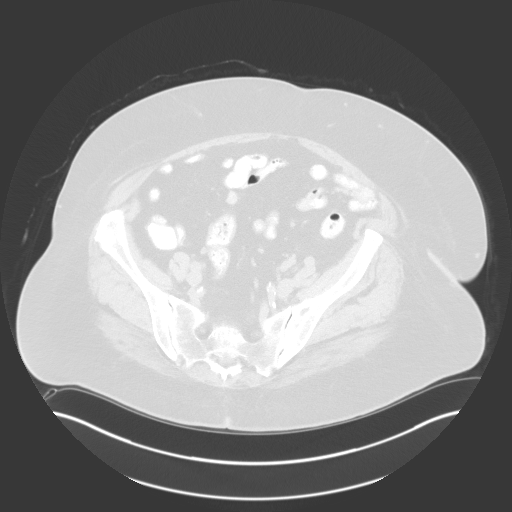
[im 43/128  lung]
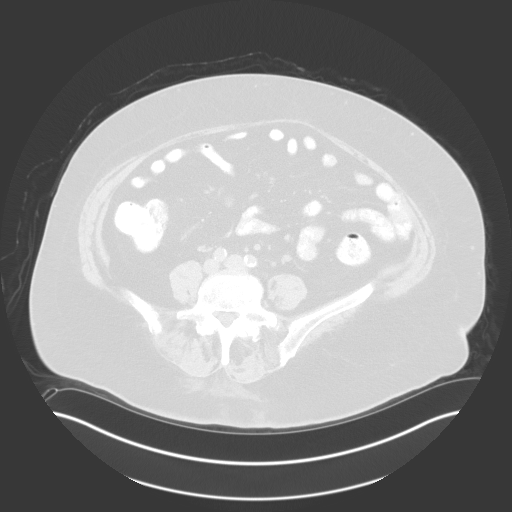
[im 53/128  mediastinal]
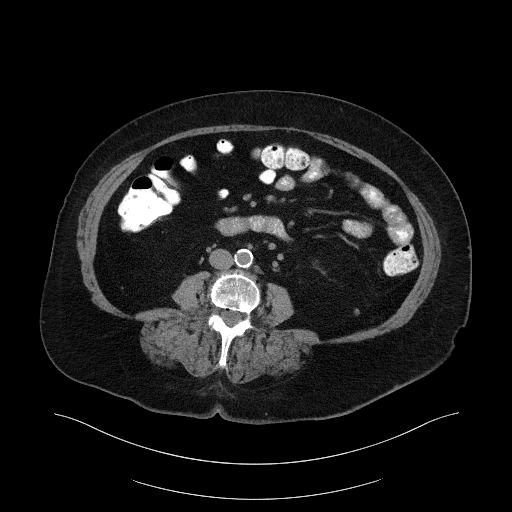
[im 53/128  lung]
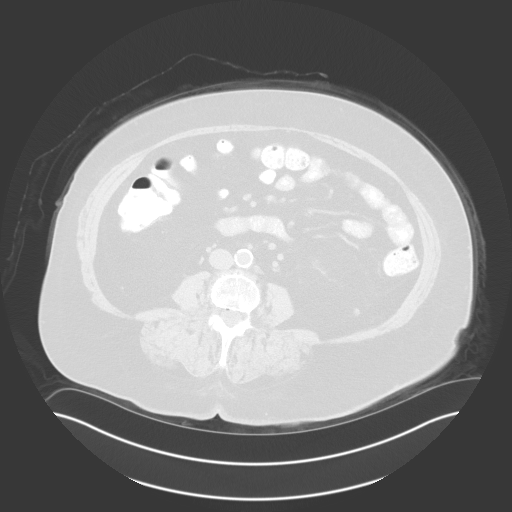
[im 75/128  lung]
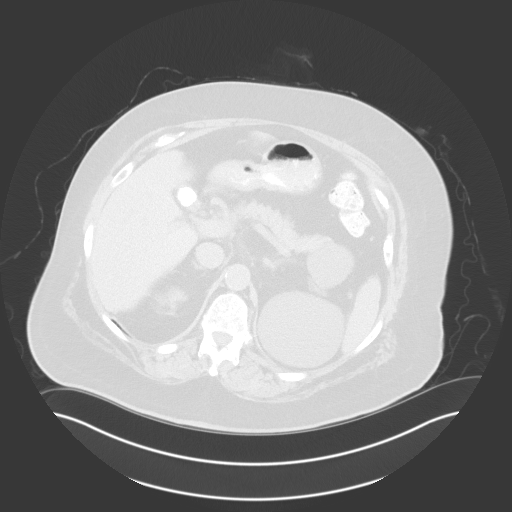
[im 85/128  lung]
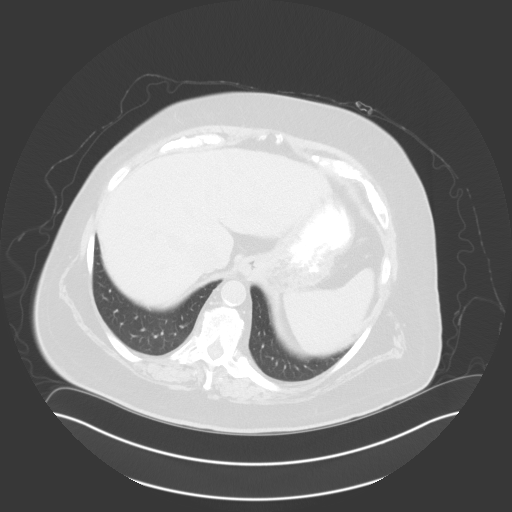
[im 96/128  lung]
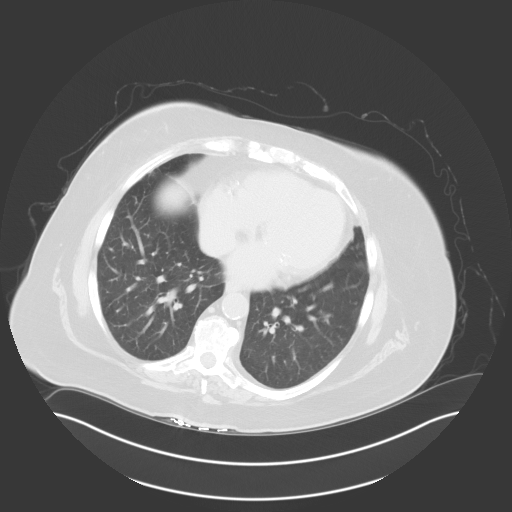
[im 106/128  mediastinal]
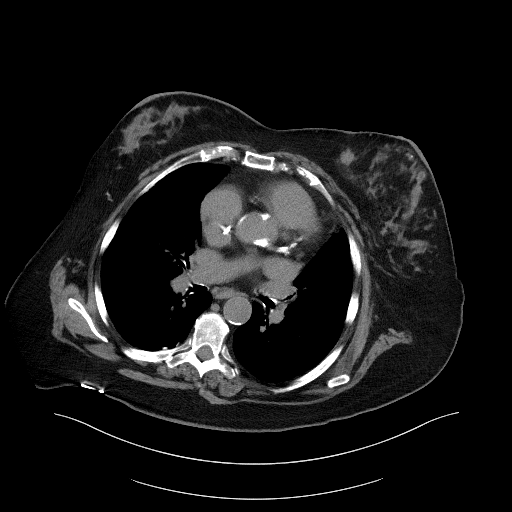
[im 106/128  lung]
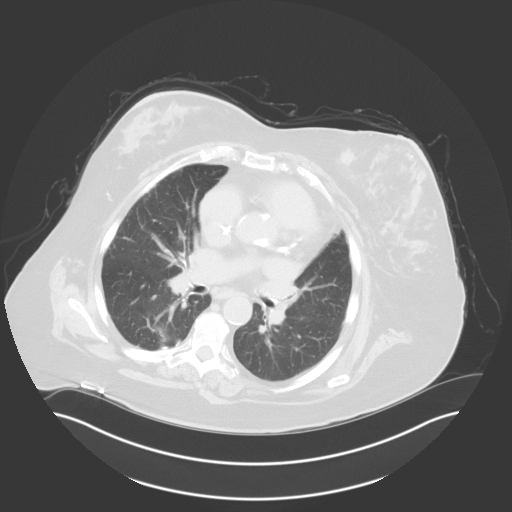
[im 117/128  lung]
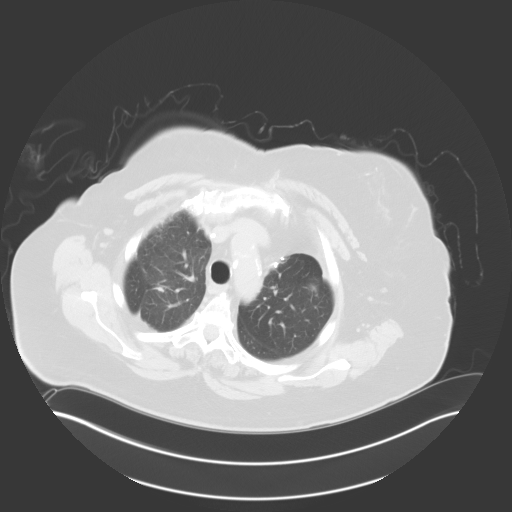

[Series 5: coronals · coronal · 0.82mm/px · 3 of 144 slices shown]
[im 29/144  lung]
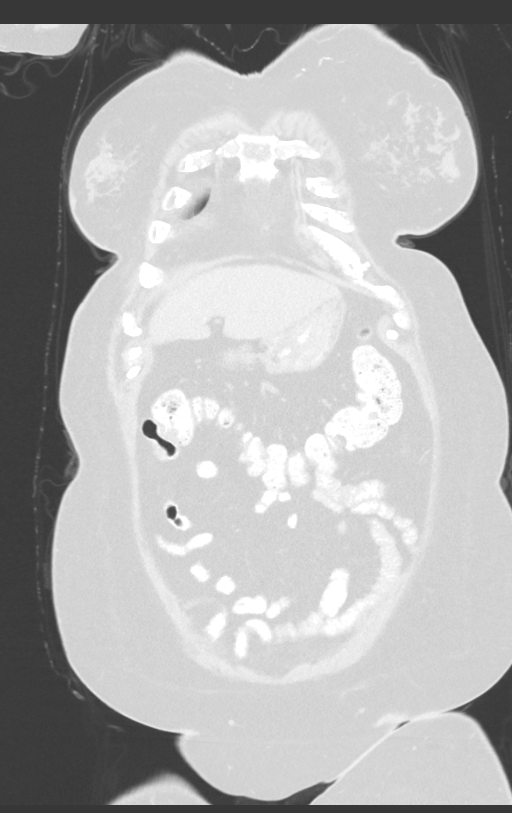
[im 58/144  lung]
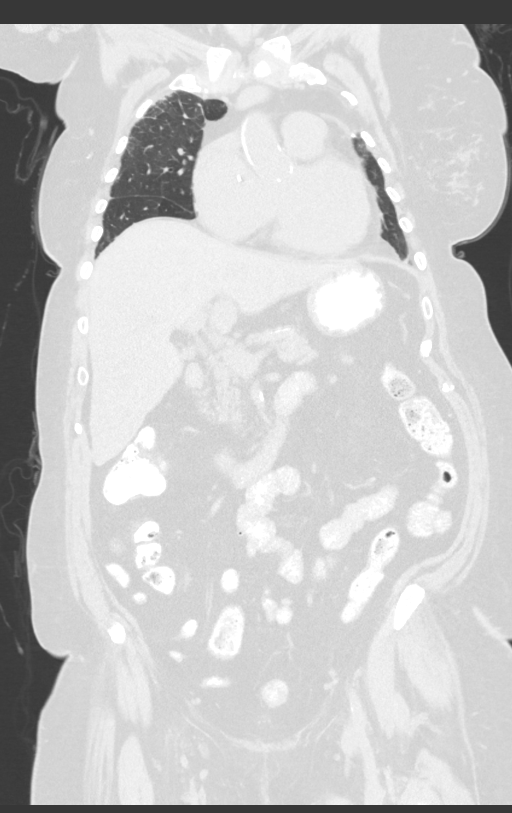
[im 86/144  lung]
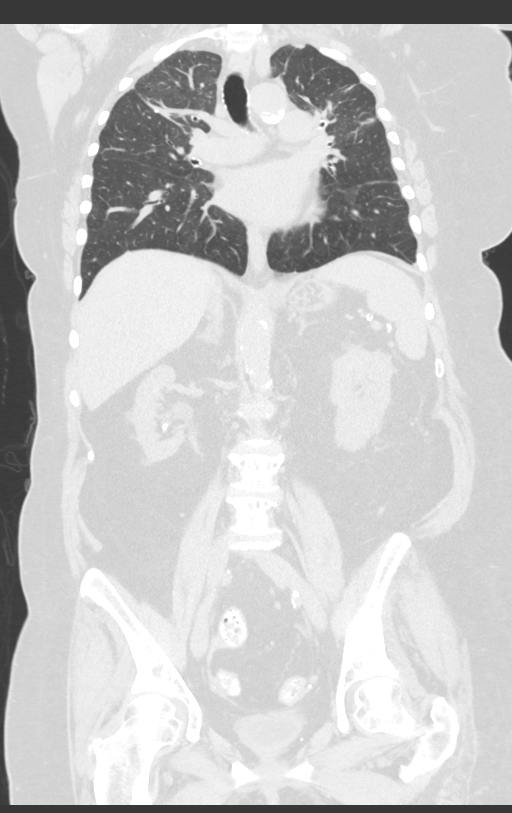

[13 of 36 positions shown; findings below may reference images not displayed]

FINDINGS: CT CHEST FINDINGS

Cardiovascular: Right chest port catheter. Aortic atherosclerosis.
Aortic valve calcifications. Normal heart size. Three-vessel
coronary artery calcifications. No pericardial effusion.

Mediastinum/Nodes: No enlarged mediastinal, hilar, or axillary lymph
nodes. Thyroid gland, trachea, and esophagus demonstrate no
significant findings.

Lungs/Pleura: Redemonstrated postoperative findings of right upper
lobe, superior segment right lower lobe, and left upper lobe wedge
resections. Unchanged, rounded ground-glass opacity of the
subpleural right lower lobe measuring 1.2 x 0.9 cm (series 4, image
92). No pleural effusion or pneumothorax.

Musculoskeletal: No chest wall mass or suspicious bone lesions
identified. Surgical clips in the right breast and right axilla.

CT ABDOMEN PELVIS FINDINGS

Hepatobiliary: No solid liver abnormality is seen. Large rim
calcified gallstone in the gallbladder. Gallbladder wall thickening,
or biliary dilatation.

Pancreas: Unremarkable. No pancreatic ductal dilatation or
surrounding inflammatory changes.

Spleen: Normal in size without significant abnormality.

Adrenals/Urinary Tract: Adrenal glands are unremarkable. Small
nonobstructive bilateral renal calculi. There are multiple exophytic
left renal cysts. No hydronephrosis. Bladder is unremarkable.

Stomach/Bowel: Stomach is within normal limits. Appendix appears
normal. No evidence of bowel wall thickening, distention, or
inflammatory changes.

Vascular/Lymphatic: Aortic atherosclerosis. No enlarged abdominal or
pelvic lymph nodes.

Reproductive: Status post hysterectomy.

Other: No abdominal wall hernia or abnormality. No abdominopelvic
ascites.

Musculoskeletal: No acute or significant osseous findings.
IMPRESSION: 1. Redemonstrated postoperative findings of right upper lobe,
superior segment right lower lobe, and left upper lobe wedge
resections as well as right lumpectomy.
2. No evidence of metastatic disease in the chest, abdomen, or
pelvis. Please note that noncontrast CT is significantly limited for
the detection of solid organ lesions and metastases.
3. Unchanged, rounded ground-glass opacity of the subpleural right
lower lobe measuring 1.2 x 0.9 cm. Although unchanged in size this
remains morphologically suspicious for indolent adenocarcinoma.
Attention on follow-up.
4. Cholelithiasis.
5. Nonobstructive bilateral nephrolithiasis.
6. Coronary artery disease.  Aortic Atherosclerosis ([id number].W).

## 2022-02-17 IMAGING — CT CT CHEST W/O CM
2 of 4 series · 13 of 36 positions shown, 16 images · non-contrast
Comparison: 03/31/2020

CLINICAL DATA: Non-small cell lung cancer staging, assess treatment
response, history of left lung cancer, right breast cancer, and
colorectal cancer with liver metastases

EXAM:
CT CHEST, ABDOMEN AND PELVIS WITHOUT CONTRAST
TECHNIQUE: Multidetector CT imaging of the chest, abdomen and pelvis was
performed following the standard protocol without IV contrast.
Additional oral enteric contrast was administered

[Series 2: cap w/o · axial · non-contrast · 0.98mm/px · z∈[-620,-90]mm · 10 of 128 slices shown, 13 images]
[im 11/128  mediastinal]
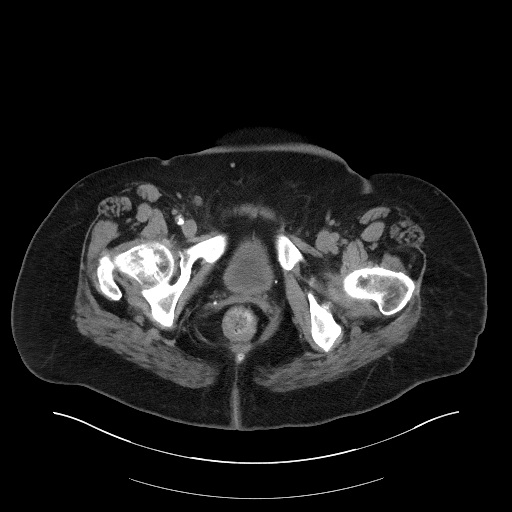
[im 11/128  lung]
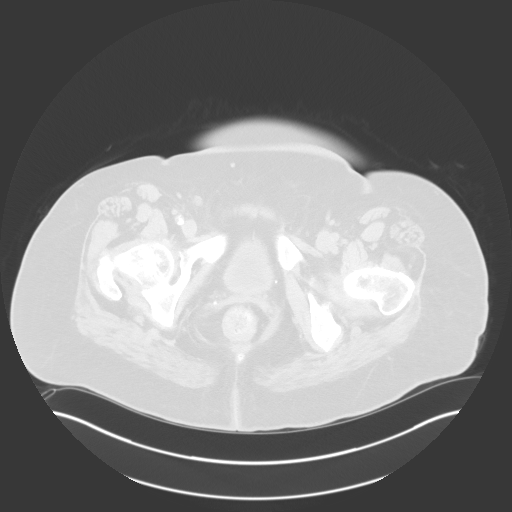
[im 22/128  lung]
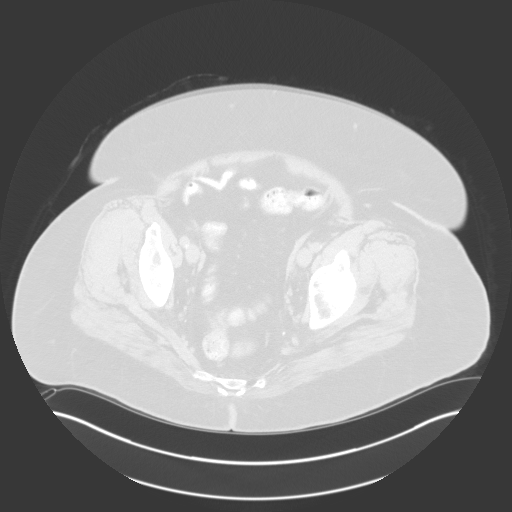
[im 32/128  lung]
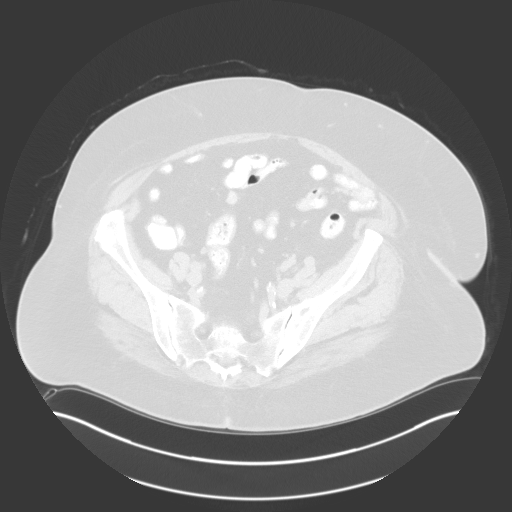
[im 43/128  lung]
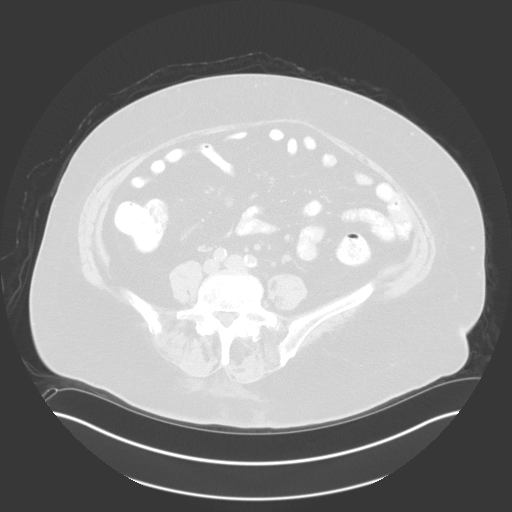
[im 53/128  mediastinal]
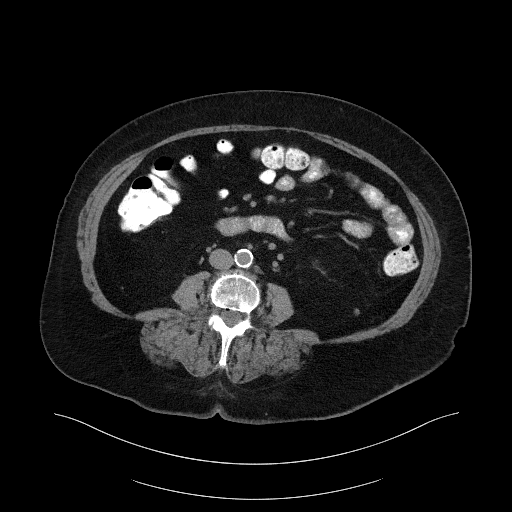
[im 53/128  lung]
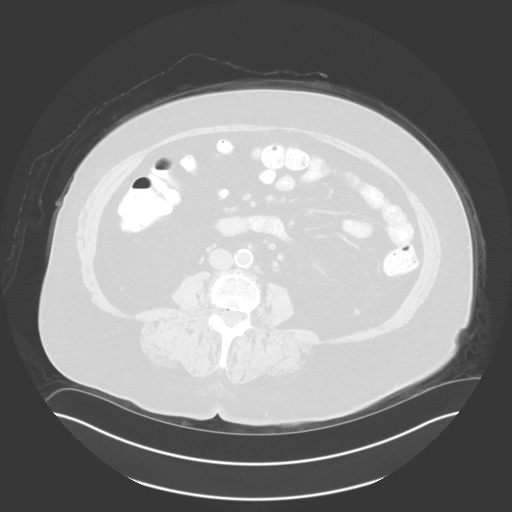
[im 75/128  lung]
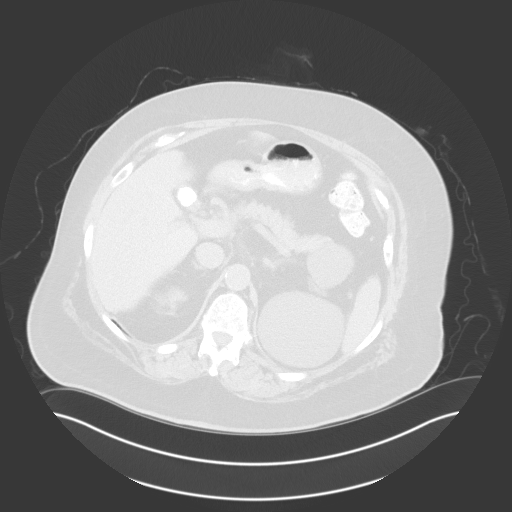
[im 85/128  lung]
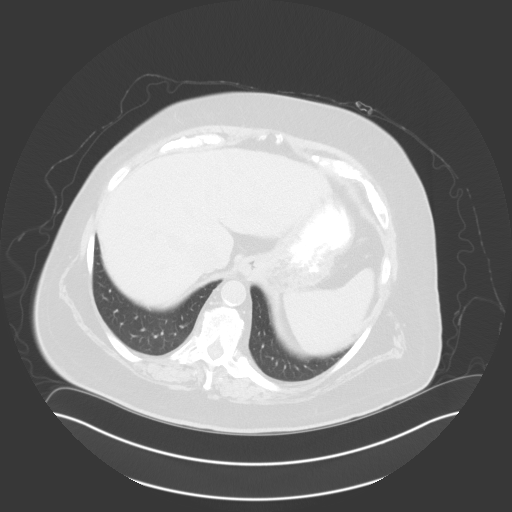
[im 96/128  lung]
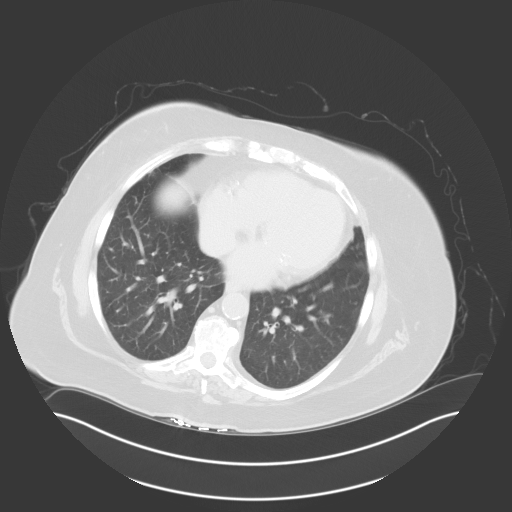
[im 106/128  mediastinal]
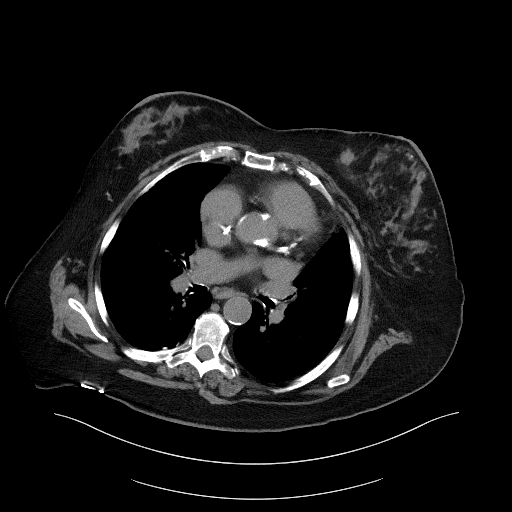
[im 106/128  lung]
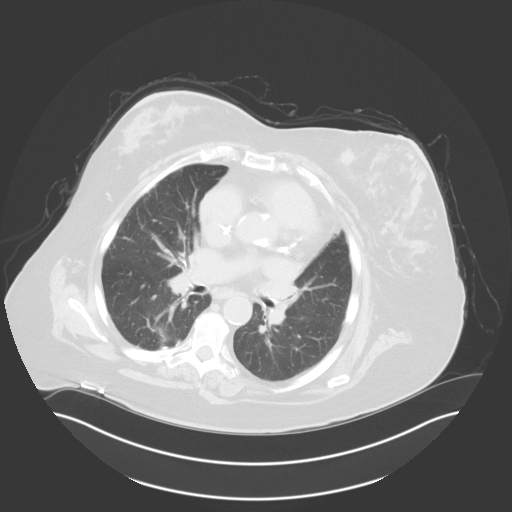
[im 117/128  lung]
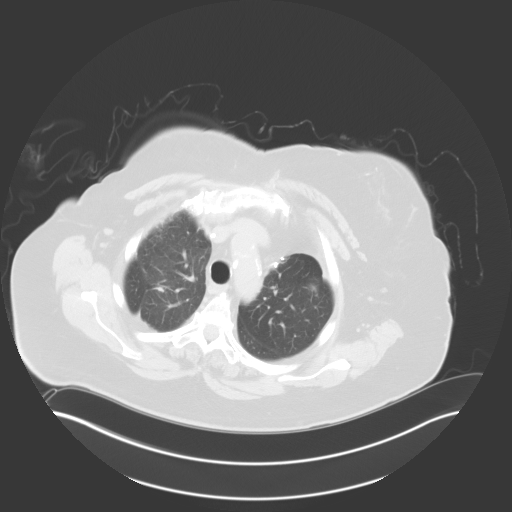

[Series 5: coronals · coronal · 0.82mm/px · 3 of 144 slices shown]
[im 29/144  lung]
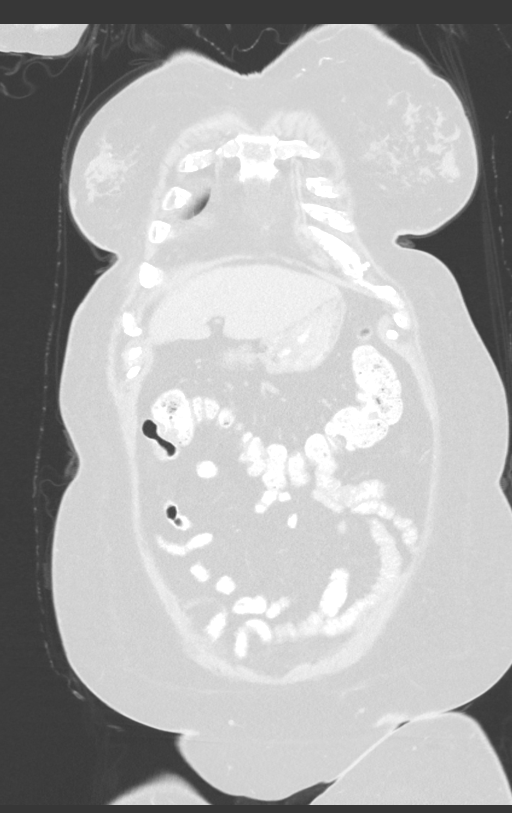
[im 58/144  lung]
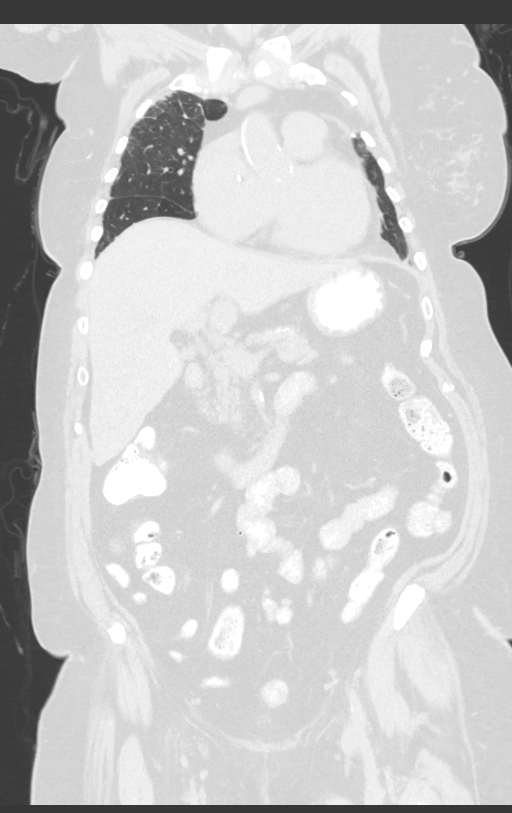
[im 86/144  lung]
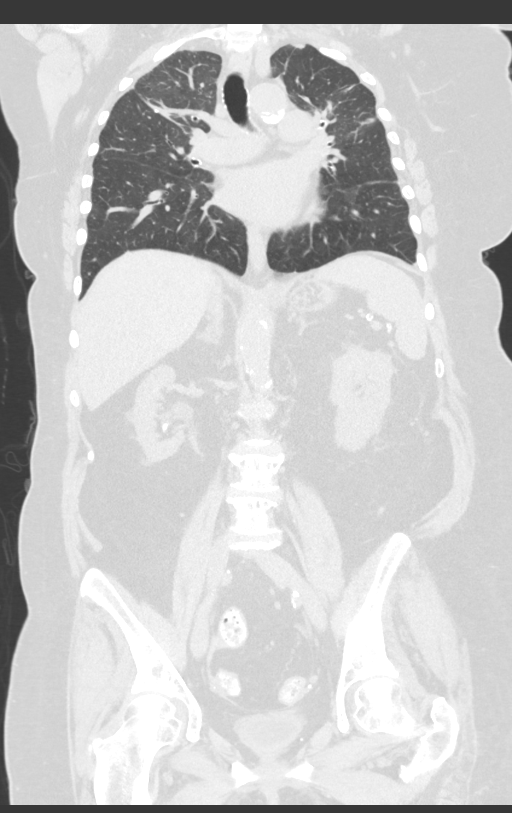

[13 of 36 positions shown; findings below may reference images not displayed]

FINDINGS: CT CHEST FINDINGS

Cardiovascular: Right chest port catheter. Aortic atherosclerosis.
Aortic valve calcifications. Normal heart size. Three-vessel
coronary artery calcifications. No pericardial effusion.

Mediastinum/Nodes: No enlarged mediastinal, hilar, or axillary lymph
nodes. Thyroid gland, trachea, and esophagus demonstrate no
significant findings.

Lungs/Pleura: Redemonstrated postoperative findings of right upper
lobe, superior segment right lower lobe, and left upper lobe wedge
resections. Unchanged, rounded ground-glass opacity of the
subpleural right lower lobe measuring 1.2 x 0.9 cm (series 4, image
92). No pleural effusion or pneumothorax.

Musculoskeletal: No chest wall mass or suspicious bone lesions
identified. Surgical clips in the right breast and right axilla.

CT ABDOMEN PELVIS FINDINGS

Hepatobiliary: No solid liver abnormality is seen. Large rim
calcified gallstone in the gallbladder. Gallbladder wall thickening,
or biliary dilatation.

Pancreas: Unremarkable. No pancreatic ductal dilatation or
surrounding inflammatory changes.

Spleen: Normal in size without significant abnormality.

Adrenals/Urinary Tract: Adrenal glands are unremarkable. Small
nonobstructive bilateral renal calculi. There are multiple exophytic
left renal cysts. No hydronephrosis. Bladder is unremarkable.

Stomach/Bowel: Stomach is within normal limits. Appendix appears
normal. No evidence of bowel wall thickening, distention, or
inflammatory changes.

Vascular/Lymphatic: Aortic atherosclerosis. No enlarged abdominal or
pelvic lymph nodes.

Reproductive: Status post hysterectomy.

Other: No abdominal wall hernia or abnormality. No abdominopelvic
ascites.

Musculoskeletal: No acute or significant osseous findings.
IMPRESSION: 1. Redemonstrated postoperative findings of right upper lobe,
superior segment right lower lobe, and left upper lobe wedge
resections as well as right lumpectomy.
2. No evidence of metastatic disease in the chest, abdomen, or
pelvis. Please note that noncontrast CT is significantly limited for
the detection of solid organ lesions and metastases.
3. Unchanged, rounded ground-glass opacity of the subpleural right
lower lobe measuring 1.2 x 0.9 cm. Although unchanged in size this
remains morphologically suspicious for indolent adenocarcinoma.
Attention on follow-up.
4. Cholelithiasis.
5. Nonobstructive bilateral nephrolithiasis.
6. Coronary artery disease.  Aortic Atherosclerosis ([id number].W).

## 2022-04-01 IMAGING — CR DG CHEST 2V
2 series · 2 of 2 positions shown · non-contrast
Comparison: 10/15/2019 and chest CT 08/01/2020

CLINICAL DATA: Chest pain and right upper quadrant pain five days.
History of lung and colon cancer.

EXAM:
CHEST - 2 VIEW

[w chest pa]
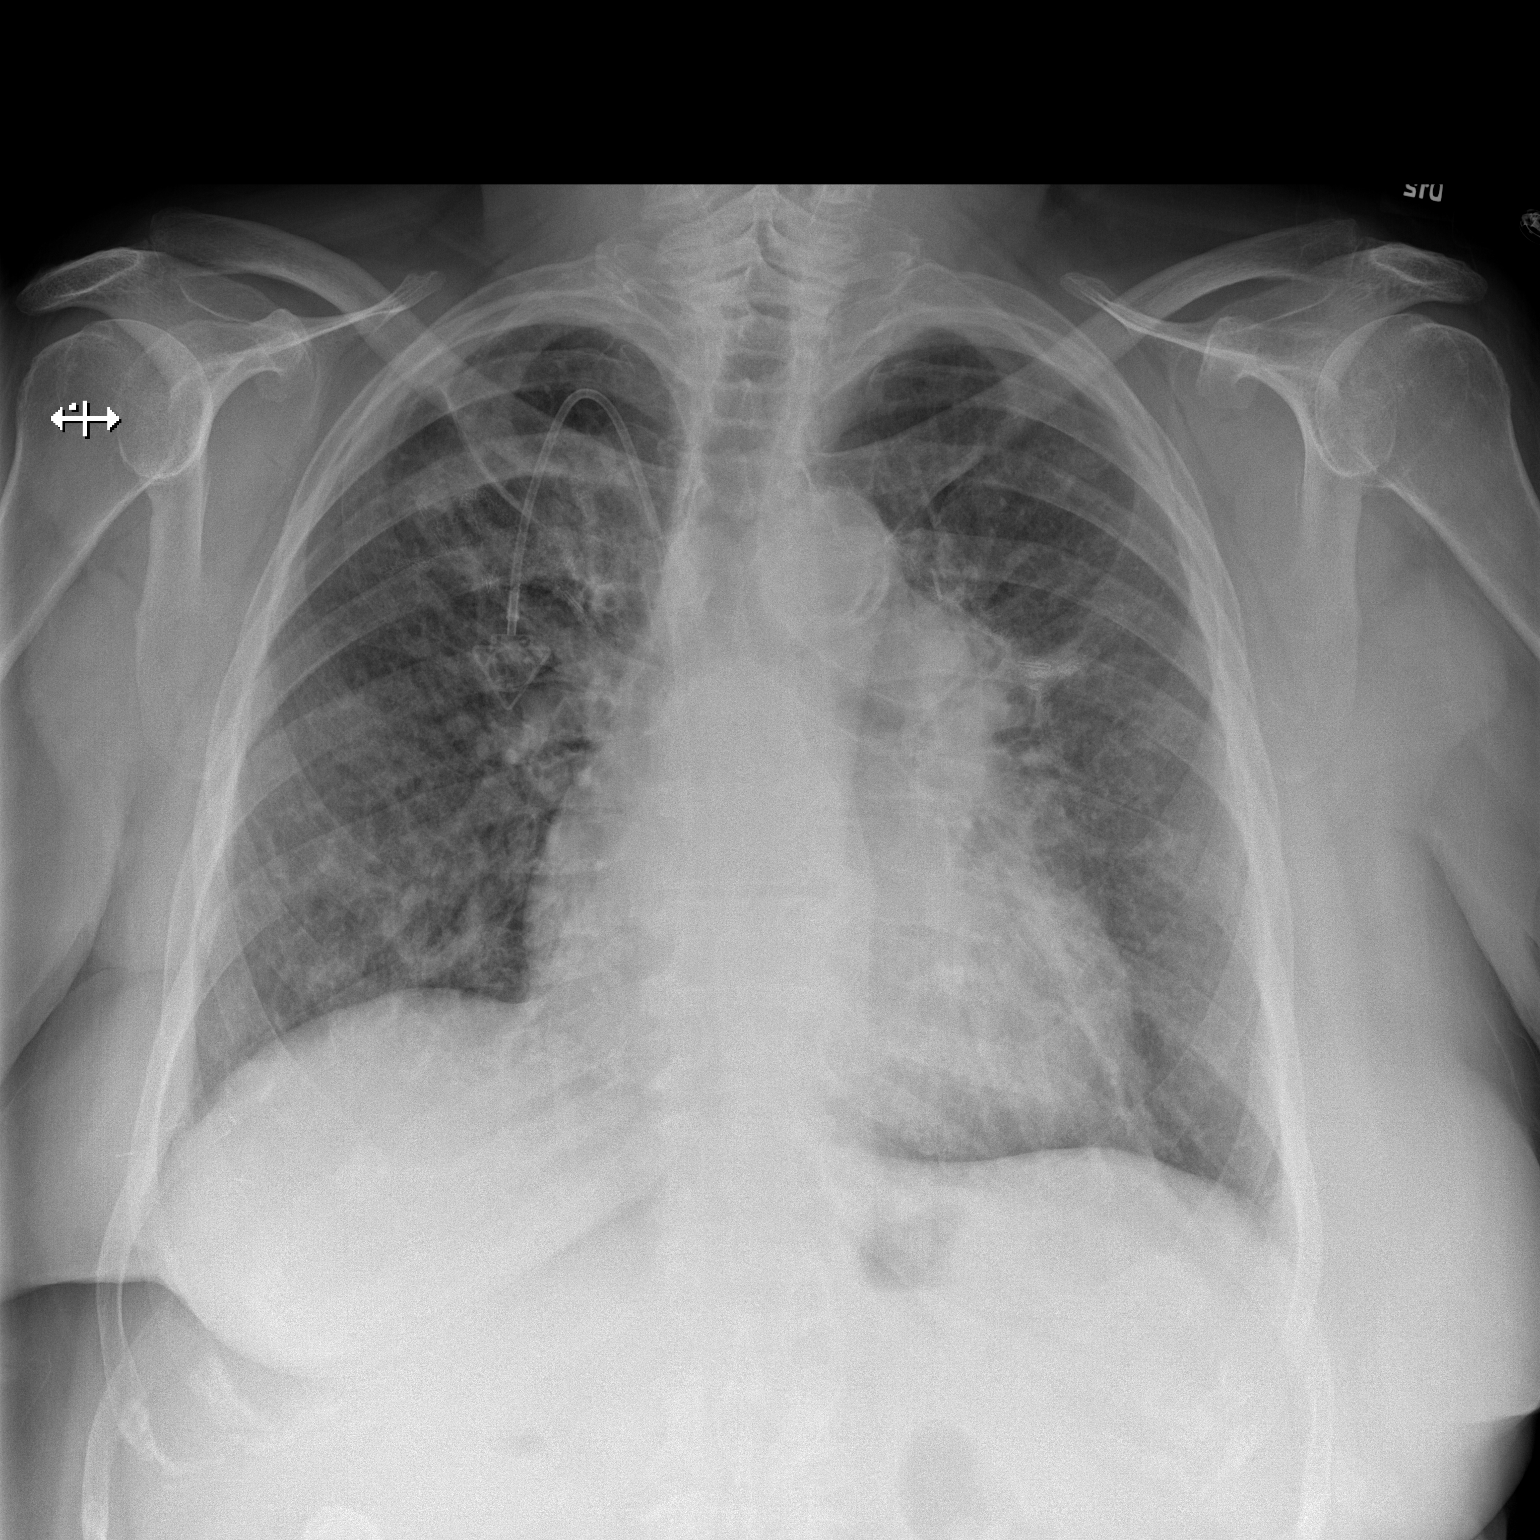

[w chest lat]
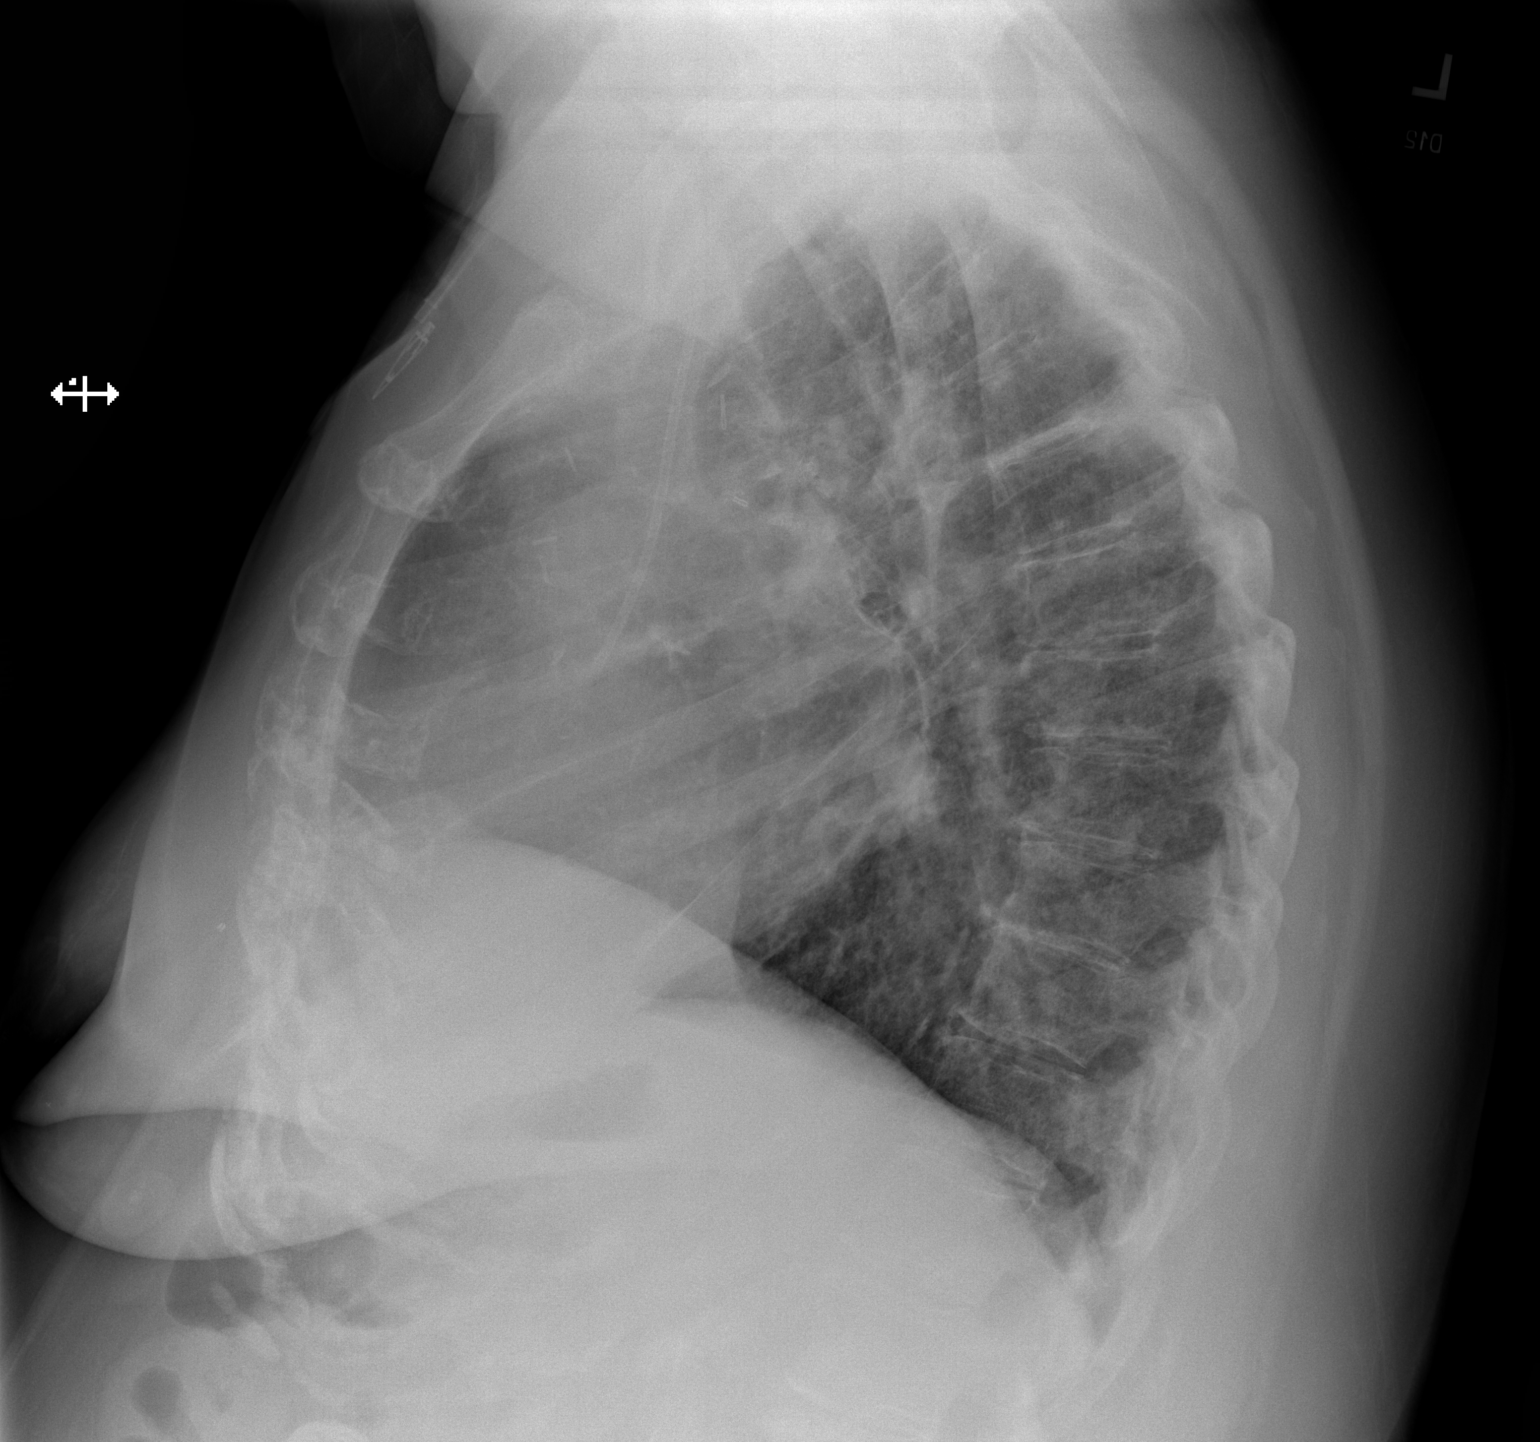

[2 of 2 positions shown; findings below may reference images not displayed]

FINDINGS: Right-sided Port-A-Cath unchanged. Lungs are adequately inflated
with surgical sutures over the upper lungs bilaterally. There is
hazy prominence of the perihilar markings which may be due to mild
vascular congestion. Mild stable cardiomegaly. Remainder of the exam
is unchanged.
IMPRESSION: 1. Suggestion of mild vascular congestion. Postsurgical change over
the upper lungs.

2.  Mild stable cardiomegaly.

## 2022-04-02 IMAGING — CT CT ANGIO CHEST
2 of 6 series · 18 of 36 positions shown · IV contrast (OMNIPAQUE 350)
Comparison: 08/01/2020

CLINICAL DATA: Chest pain

EXAM:
CT ANGIOGRAPHY CHEST WITH CONTRAST
TECHNIQUE: Multidetector CT imaging of the chest was performed using the
standard protocol during bolus administration of intravenous
contrast. Multiplanar CT image reconstructions and MIPs were
obtained to evaluate the vascular anatomy.
CONTRAST:  80mL OMNIPAQUE IOHEXOL 350 MG/ML SOLN

[Series 5: thins · axial · 0.78mm/px · z∈[+1332,+1558]mm · 17 of 255 slices shown]
[im 15/255  lung]
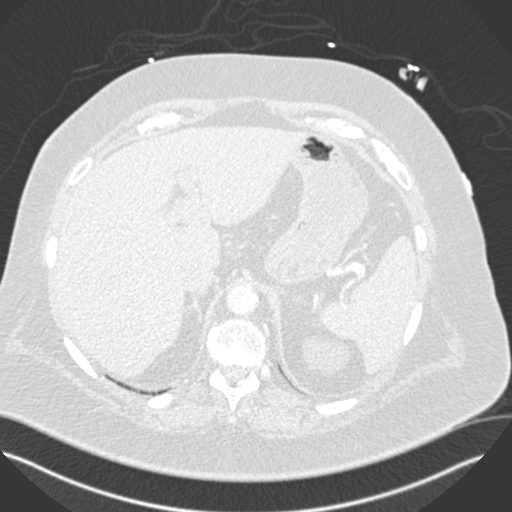
[im 29/255  mediastinal]
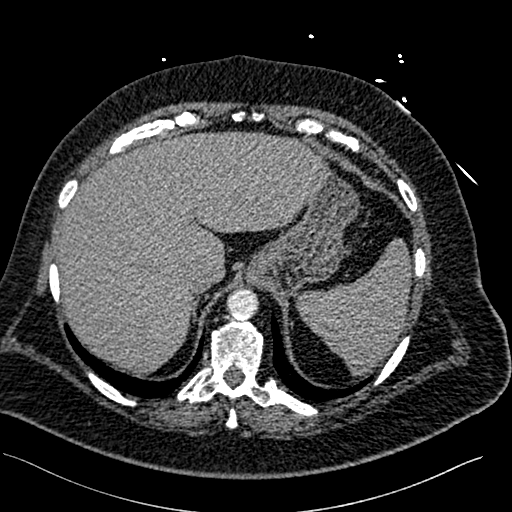
[im 43/255  lung]
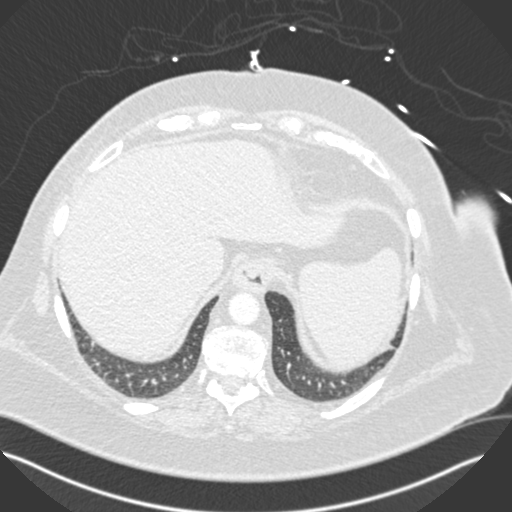
[im 57/255  mediastinal]
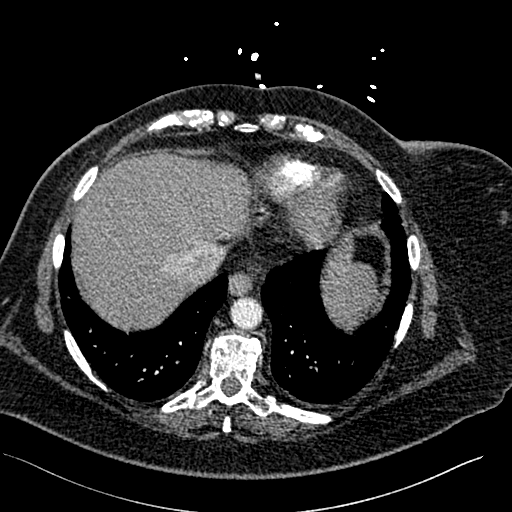
[im 71/255  lung]
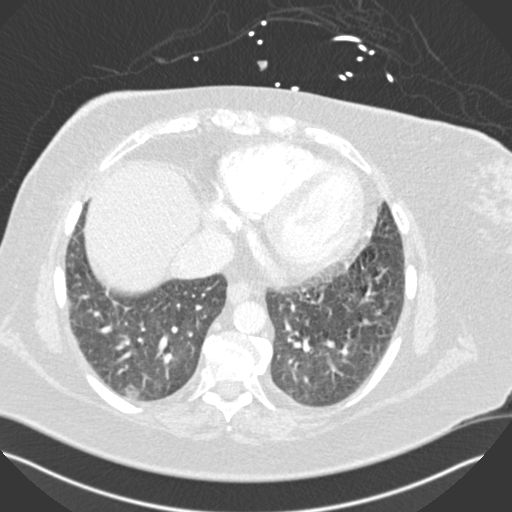
[im 85/255  mediastinal]
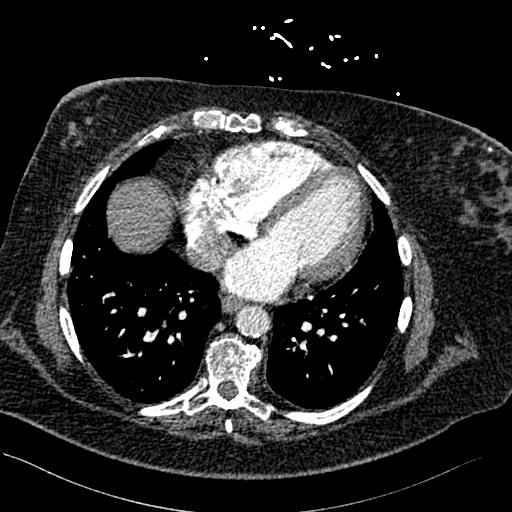
[im 99/255  lung]
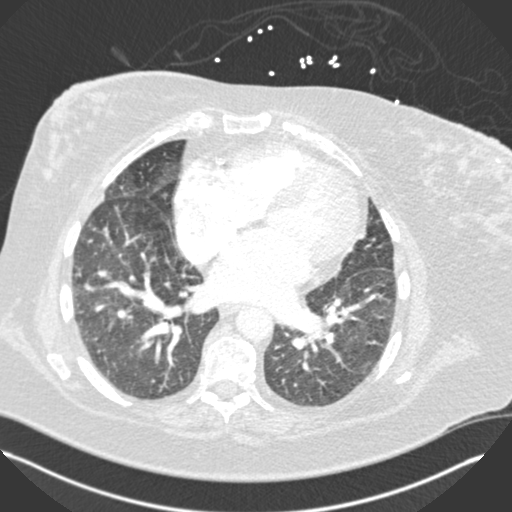
[im 113/255  mediastinal]
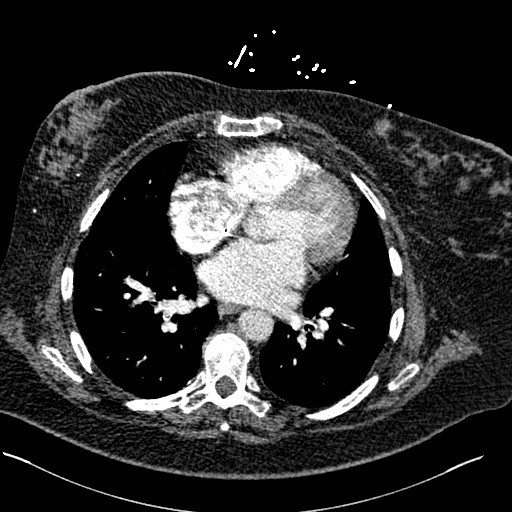
[im 128/255  lung]
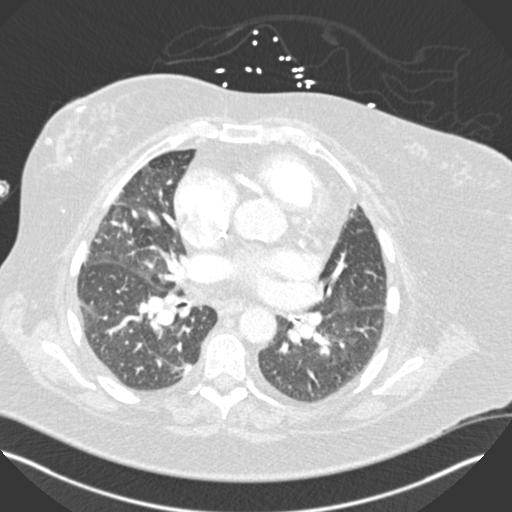
[im 142/255  mediastinal]
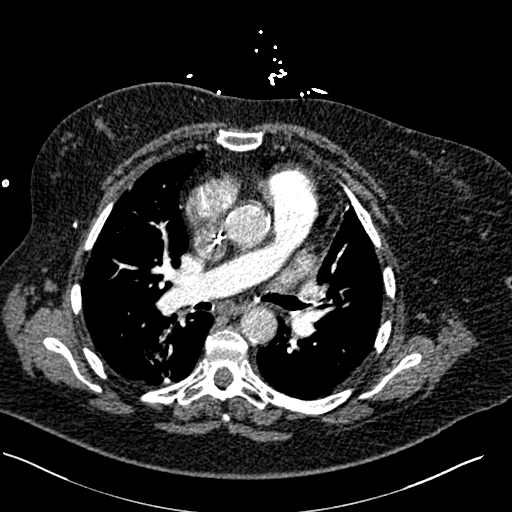
[im 156/255  lung]
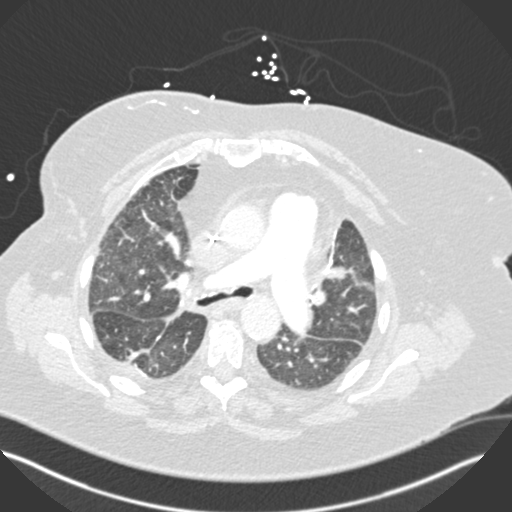
[im 170/255  mediastinal]
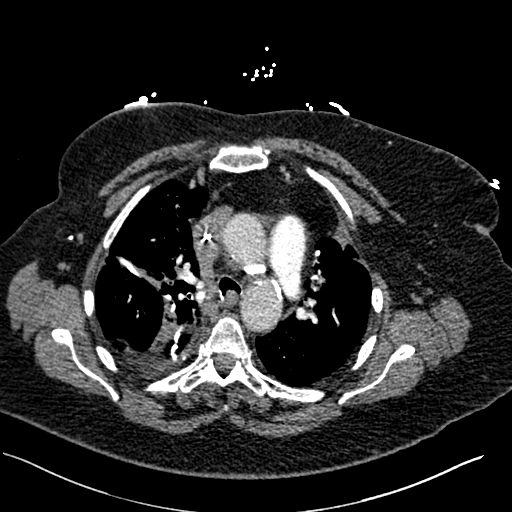
[im 184/255  lung]
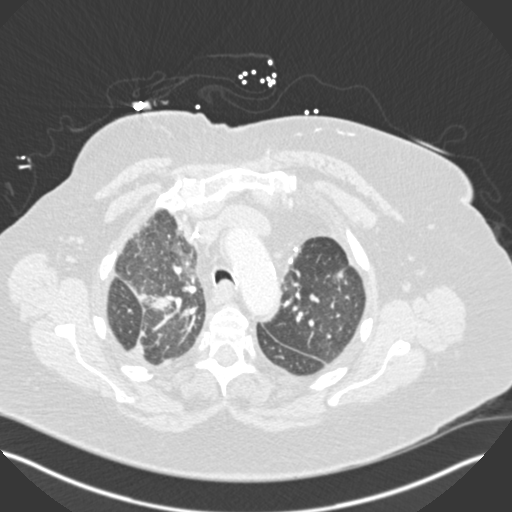
[im 198/255  mediastinal]
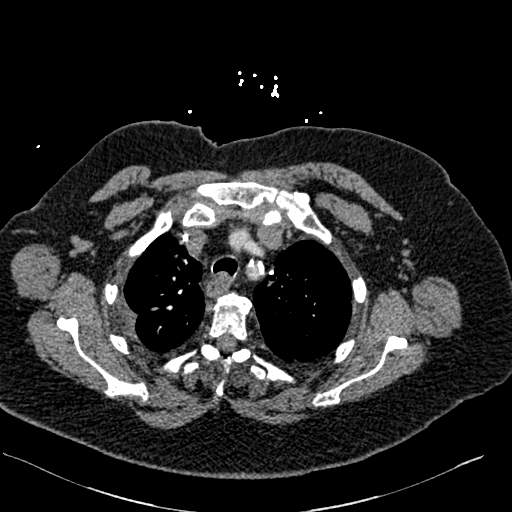
[im 212/255  lung]
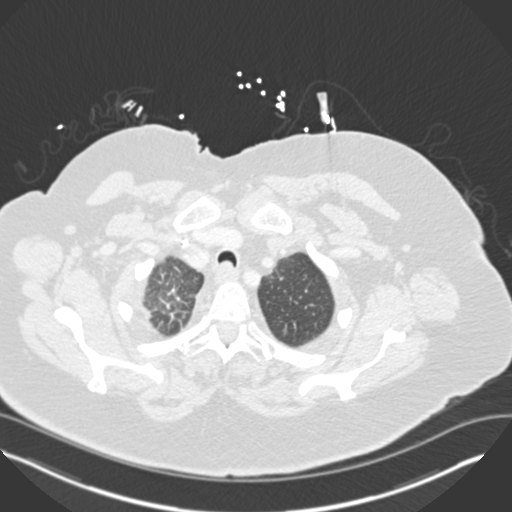
[im 226/255  mediastinal]
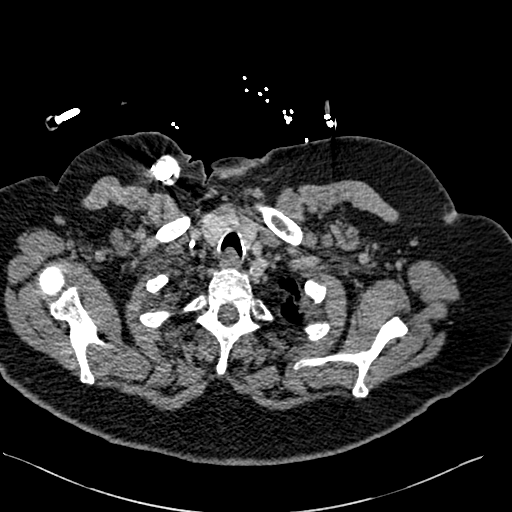
[im 240/255  lung]
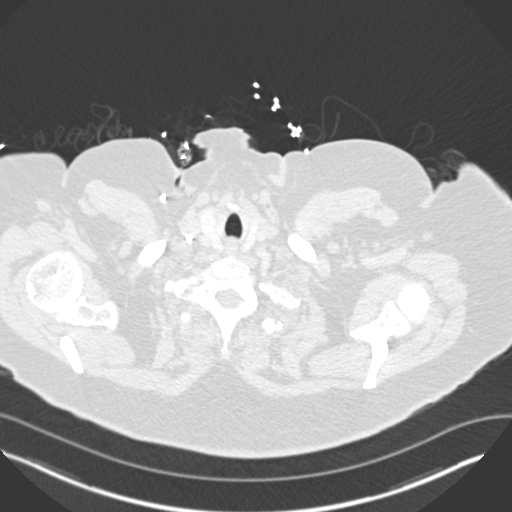

[Series 7: coronal mpr · coronal · 0.52mm/px · 1 of 151 slices shown]
[im 76/151  mediastinal]
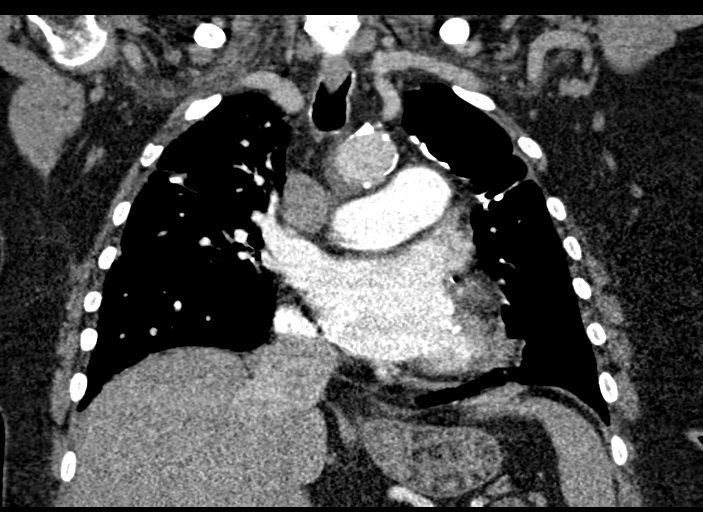

[18 of 36 positions shown; findings below may reference images not displayed]

FINDINGS: Cardiovascular: There is adequate opacification of the pulmonary
arterial tree through the segmental level. There is no intraluminal
filling defect to suggest acute pulmonary embolism. The central
pulmonary arteries are enlarged in keeping with changes of pulmonary
arterial hypertension. There is extensive coronary artery
calcification. Cardiac size is at the upper limits of normal. No
pericardial effusion. Atherosclerotic calcification is seen within
the thoracic aorta. No aortic aneurysm identified. Right internal
jugular chest port is in place with its tip within the right atrium.

Mediastinum/Nodes: No pathologic thoracic adenopathy. Esophagus
unremarkable. Thyroid unremarkable.

Lungs/Pleura: Surgical changes of a partial right, superior segment
right, and partial left upper lobectomy are again identified. Since
the prior examination, there has developed mild smooth interlobular
septal thickening as well as ground-glass pulmonary infiltrate
suspicious for changes of mild to moderate interstitial pulmonary
edema, possibly cardiogenic in nature. 12 mm right lower lobe
subpleural ground-glass pulmonary nodule is again identified and is
unchanged. No pneumothorax or pleural effusion. Central airways are
patent.

Upper Abdomen: No acute abnormality.

Musculoskeletal: No chest wall abnormality. No acute or significant
osseous findings.

Review of the MIP images confirms the above findings.
IMPRESSION: No pulmonary embolism.

Interval development of mild to moderate pulmonary edema, possibly
cardiogenic in nature.

Extensive coronary artery calcification. Morphologic changes in
keeping with pulmonary arterial hypertension.

Stable 12 mm right lower lobe subpleural pulmonary nodule.

Aortic Atherosclerosis ([id number].T).

## 2022-04-04 IMAGING — DX DG CHEST 1V
1 series · 1 of 1 positions shown · non-contrast
Comparison: 09/13/2020

CLINICAL DATA: Dyspnea, chest pain

EXAM:
CHEST  1 VIEW

[chest ap]
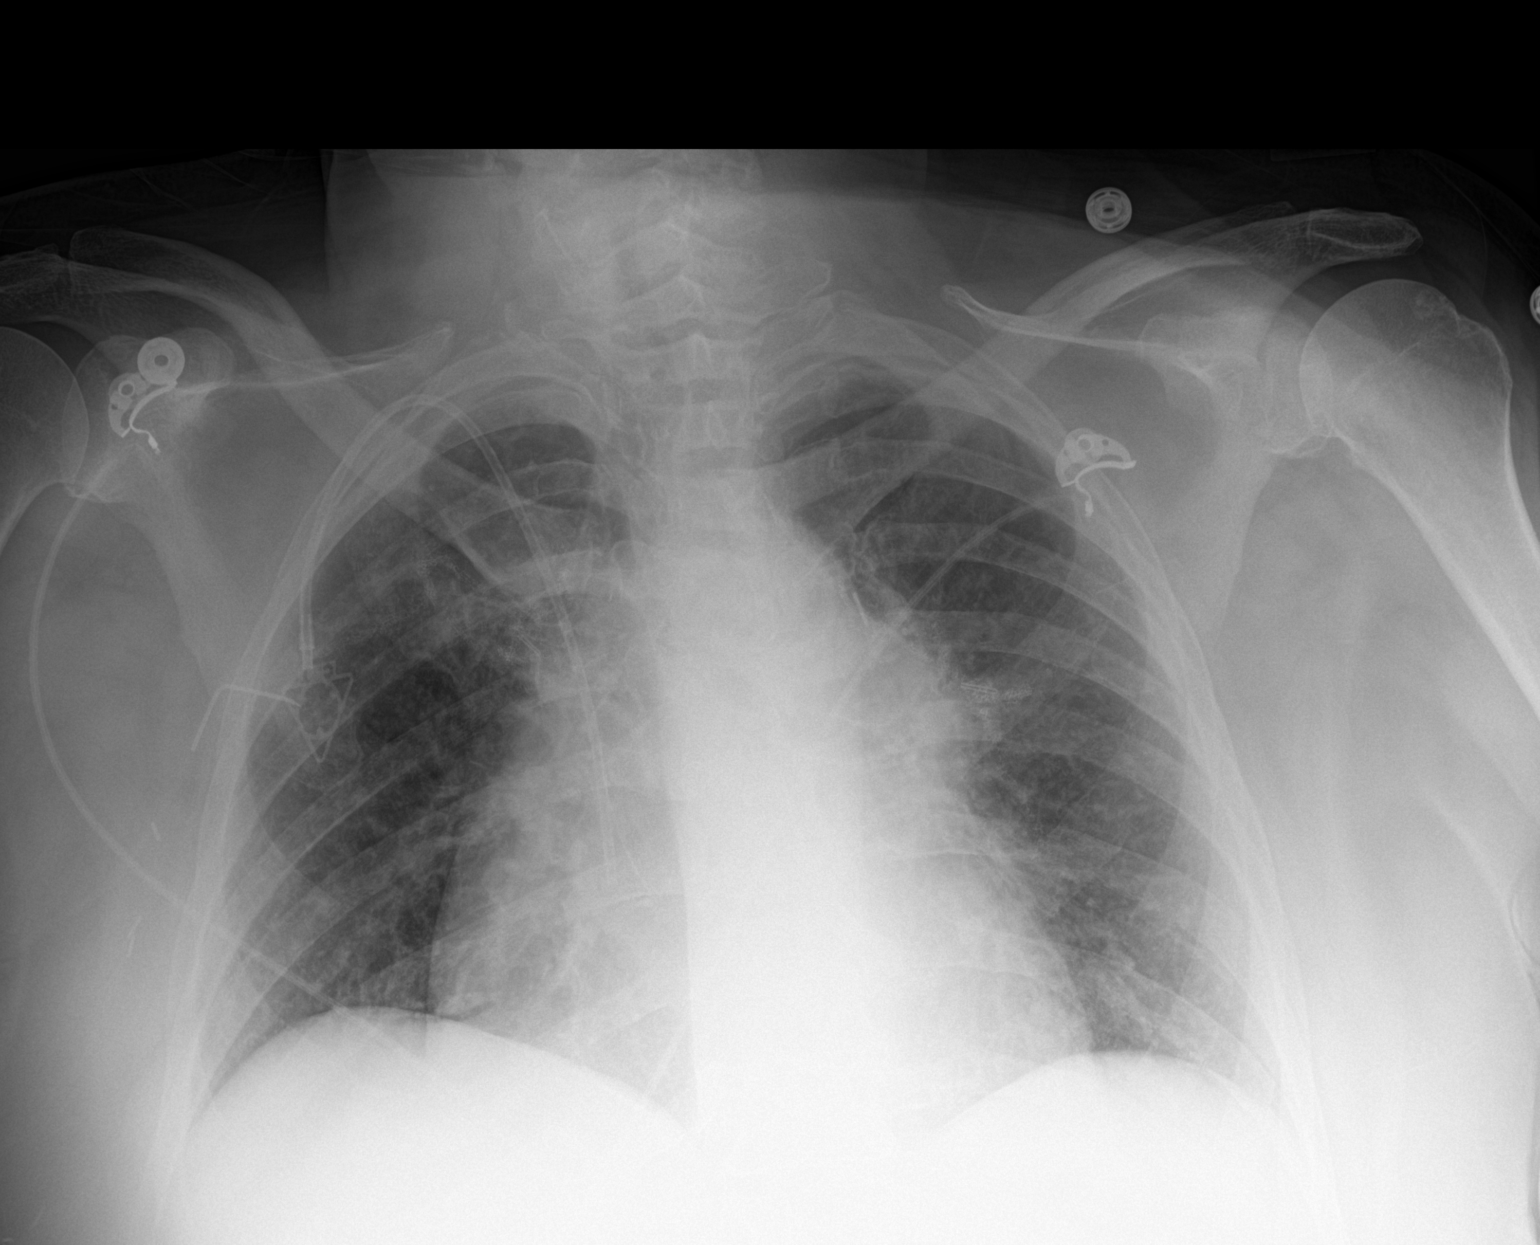

[1 of 1 positions shown; findings below may reference images not displayed]

FINDINGS: Lungs are clear. No pneumothorax or pleural effusion. Right internal
jugular chest port is seen with its tip within the right atrium.
Cardiac size is within normal limits. Staple lines related to
partial lung resection are again identified within the hila
bilaterally. No acute bone abnormality.
IMPRESSION: No active disease.

## 2022-07-06 ENCOUNTER — Encounter: Payer: Self-pay | Admitting: Cardiology

## 2022-08-21 IMAGING — CT CT CHEST W/O CM
2 of 4 series · 13 of 36 positions shown, 16 images · non-contrast
Comparison: Most recent CT chest 09/14/2020. CT chest, abdomen and
pelvis 08/01/2020.

CLINICAL DATA: Primary Cancer Type: Lung
TECHNIQUE: Multidetector CT imaging of the chest, abdomen and pelvis was
performed following the standard protocol without IV contrast.

[Series 2: cap w/o · axial · non-contrast · 0.89mm/px · z∈[-404,+136]mm · 10 of 130 slices shown, 13 images]
[im 11/130  mediastinal]
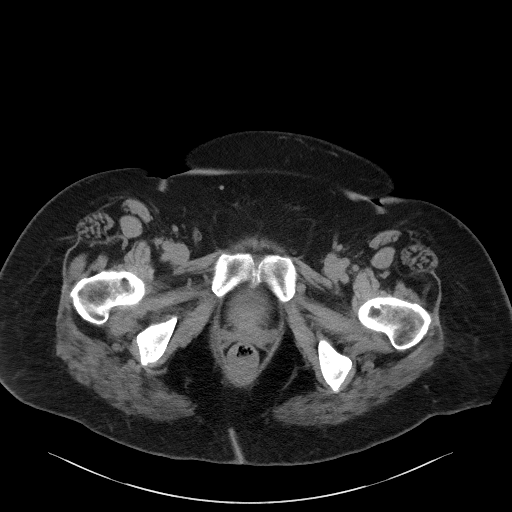
[im 11/130  lung]
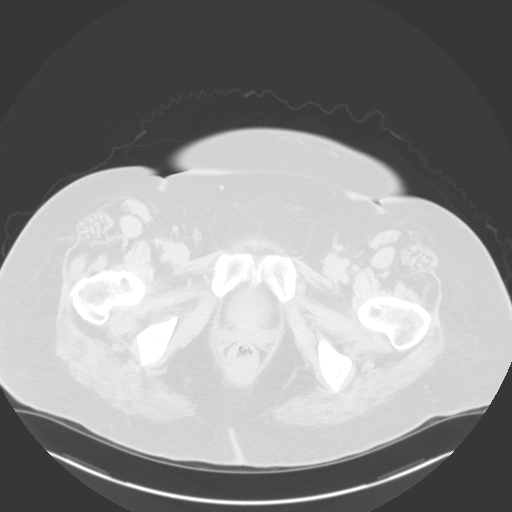
[im 22/130  lung]
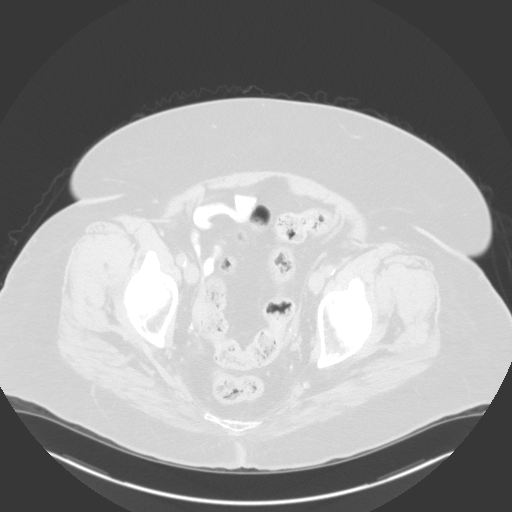
[im 33/130  lung]
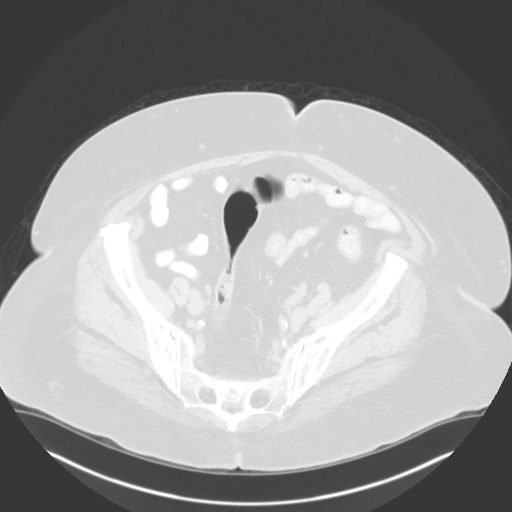
[im 44/130  lung]
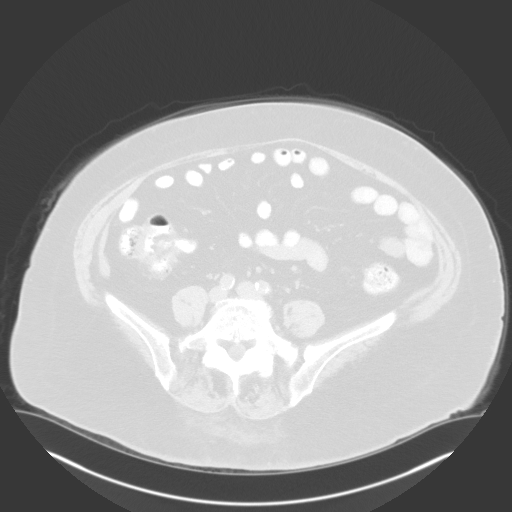
[im 54/130  mediastinal]
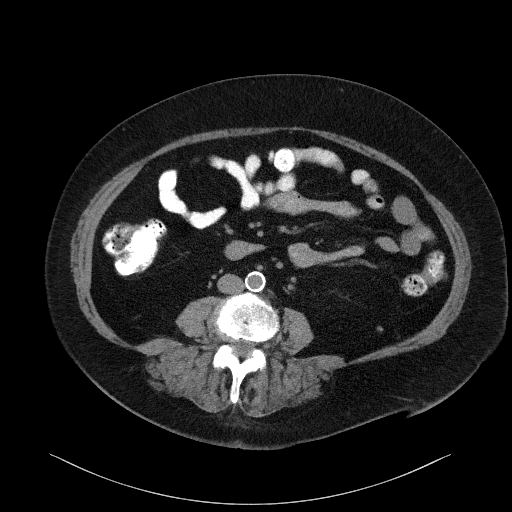
[im 54/130  lung]
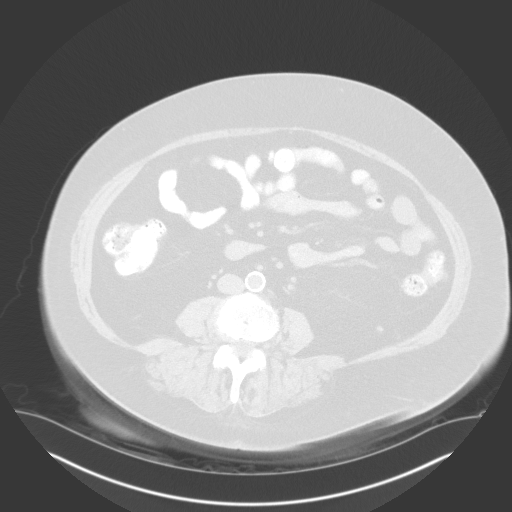
[im 76/130  lung]
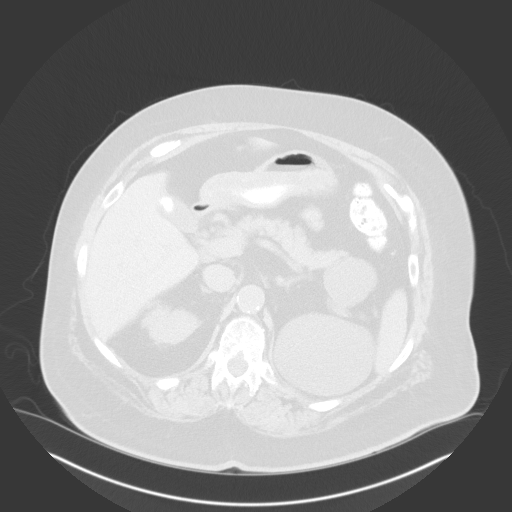
[im 87/130  lung]
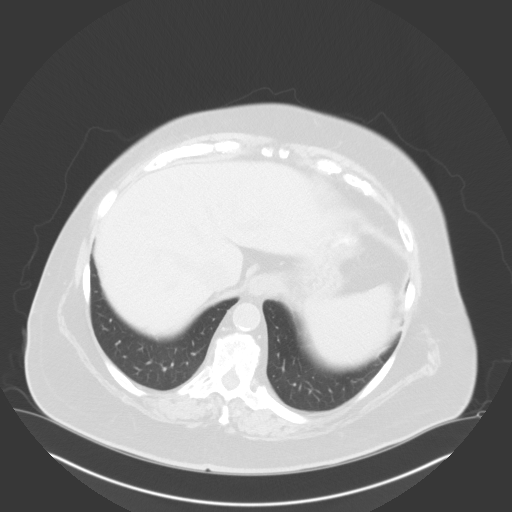
[im 97/130  lung]
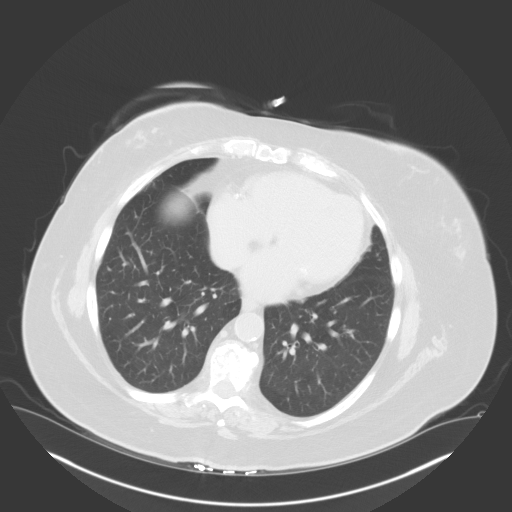
[im 108/130  mediastinal]
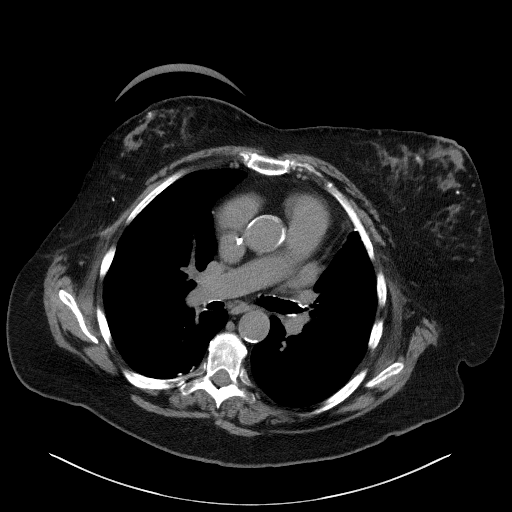
[im 108/130  lung]
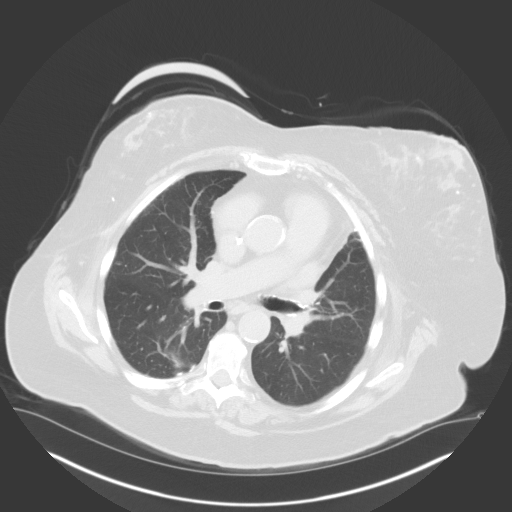
[im 119/130  lung]
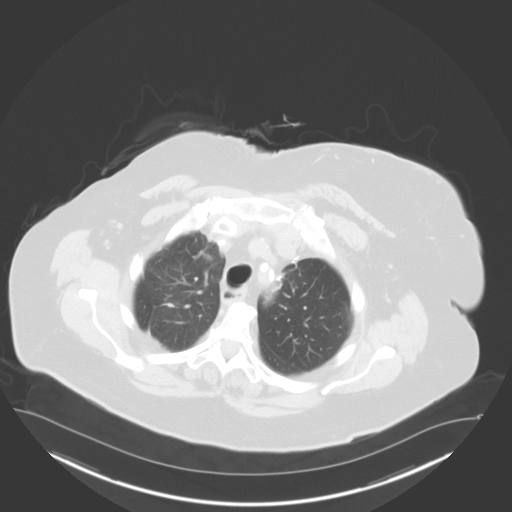

[Series 5: coronals · coronal · 0.92mm/px · 3 of 157 slices shown]
[im 32/157  lung]
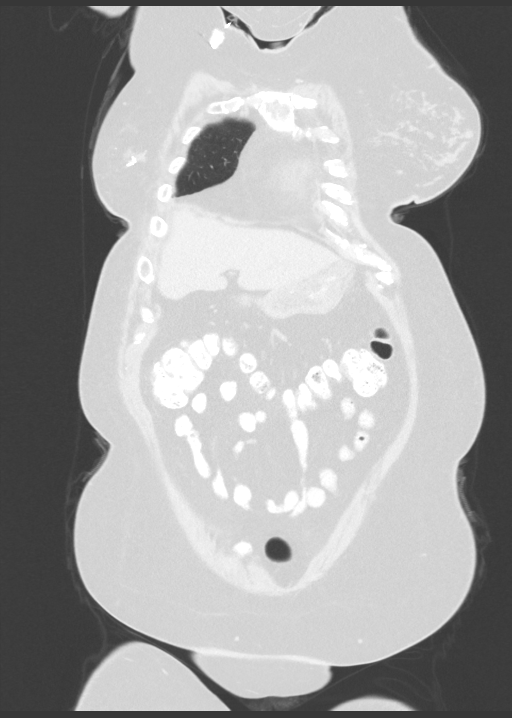
[im 63/157  lung]
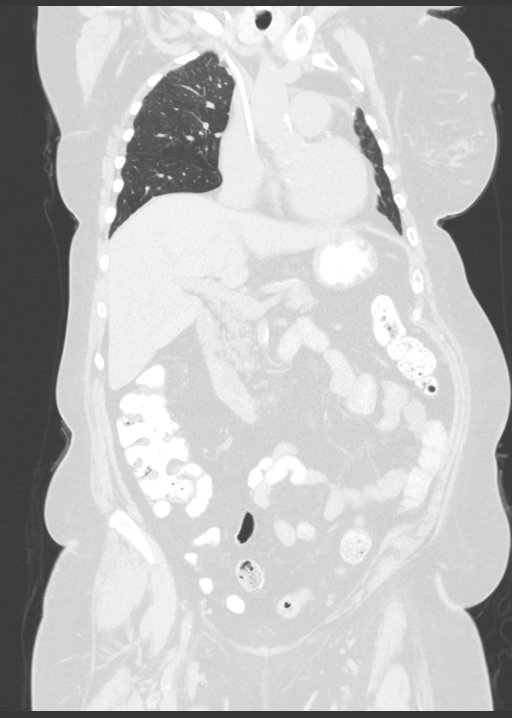
[im 94/157  lung]
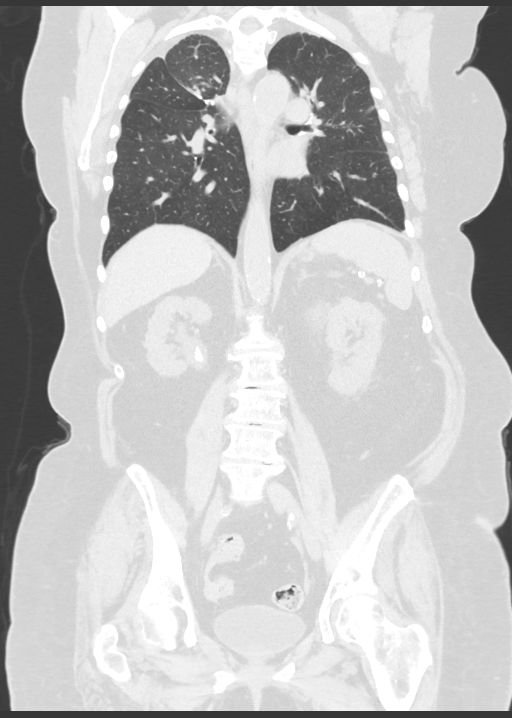

[13 of 36 positions shown; findings below may reference images not displayed]

Imaging Indication: Routine surveillance

Interval therapy since last imaging? No

Initial Cancer Diagnosis

Date: 10/01/2011; established by: Biopsy-proven

Detailed Pathology: Stage IA non-small cell lung cancer consistent
with adenocarcinoma.

Primary Tumor location: Left lung.

Recurrence? Yes; Stage IIIa non-small cell lung cancer,
adenocarcinoma involving the right upper and right lower lobes.

Date(s) of recurrence: 07/15/2014; Established by: Biopsy-proven

Surgeries: Right lower lobe wedge resection and right upper lobe
segmentectomy 07/15/2014. Left lung wedge resection 10/01/2011.
Right breast lumpectomy 12/17/2013. Partial hysterectomy.

Chemotherapy: Yes; Ongoing?  No; Most recent administration: 4035

Immunotherapy?  Yes; Type: Ketruda; Ongoing? No

Radiation therapy? No

Other Cancer Therapies: Metastatic colon adenocarcinoma of the
ascending colon with liver metastasis diagnosed in September 2016.
Right breast invasive ductal carcinoma with lumpectomy; radiotherapy
[DATE].

EXAM:
CT CHEST, ABDOMEN AND PELVIS WITHOUT CONTRAST
FINDINGS: CT CHEST FINDINGS

Cardiovascular: Normal heart size. No significant pericardial
effusion/thickening. Left main and 3 vessel coronary
atherosclerosis. Atherosclerotic nonaneurysmal thoracic aorta.
Top-normal caliber main pulmonary artery (3.3 cm diameter), stable.
Right internal jugular Port-A-Cath terminates at the cavoatrial
junction.

Mediastinum/Nodes: No discrete thyroid nodules. Unremarkable
esophagus. Surgical clips again noted in the right axilla. No
pathologically enlarged axillary, mediastinal or hilar lymph nodes.

Lungs/Pleura: No pneumothorax. No pleural effusion. Postsurgical
changes from wedge resections in the posterior right upper lobe,
superior segment right lower lobe and left upper lobe. Subsolid
x 1.2 cm posterior right lower lobe pulmonary nodule with 0.9 cm
solid component (series 4/image 92), previously 2.2 x 1.2 cm with
0.9 cm solid component on 08/01/2020 CT using similar measurement
technique, stable. Tiny 0.5 cm ground-glass pulmonary nodule in the
superior segment left lower lobe (series 4/image 35), previously
cm, stable. No acute consolidative airspace disease or new
significant pulmonary nodules.

Musculoskeletal: No aggressive appearing focal osseous lesions.
Moderate thoracic spondylosis

CT ABDOMEN PELVIS FINDINGS

Hepatobiliary: Normal liver with no liver mass. Cholelithiasis. No
biliary ductal dilatation.

Pancreas: Normal, with no mass or duct dilation.

Spleen: Normal size. No mass.

Adrenals/Urinary Tract: Normal adrenals. Nonobstructing 9 x 4 mm
right renal pelvis stone. Nonobstructing 2 mm lower left renal
stone. Numerous simple left renal cysts, largest an exophytic 9.4 cm
posterior interpolar left renal cyst. Normal bladder.

Stomach/Bowel: Normal non-distended stomach. Normal caliber small
bowel with no small bowel wall thickening. Normal appendix. Oral
contrast transits to the left colon. Normal large bowel with no
diverticulosis, large bowel wall thickening or pericolonic fat
stranding.

Vascular/Lymphatic: Atherosclerotic nonaneurysmal abdominal aorta.
No pathologically enlarged lymph nodes in the abdomen or pelvis.

Reproductive: Status post hysterectomy, with no abnormal findings at
the vaginal cuff. No adnexal mass.

Other: No pneumoperitoneum, ascites or focal fluid collection.

Musculoskeletal: No aggressive appearing focal osseous lesions.
Moderate lumbar spondylosis.
IMPRESSION: 1. Stable exam. Subsolid 2.2 cm posterior right lower lobe pulmonary
nodule with 0.9 cm solid component, stable since 08/01/2020 CT. No
new or progressive findings in the chest. No evidence of local tumor
recurrence at the wedge resection sites in the lungs bilaterally.
2. No evidence of metastatic disease in the abdomen or pelvis.
3. Left main and 3 vessel coronary atherosclerosis.
4. Cholelithiasis.
5. Nonobstructing bilateral nephrolithiasis.
6. Aortic Atherosclerosis ([id number].M).

## 2022-08-21 IMAGING — CT CT ABD-PELV W/O CM
2 of 4 series · 13 of 36 positions shown, 16 images · non-contrast
Comparison: Most recent CT chest 09/14/2020. CT chest, abdomen and
pelvis 08/01/2020.

CLINICAL DATA: Primary Cancer Type: Lung
TECHNIQUE: Multidetector CT imaging of the chest, abdomen and pelvis was
performed following the standard protocol without IV contrast.

[Series 2: cap w/o · axial · non-contrast · 0.89mm/px · z∈[-404,+136]mm · 10 of 130 slices shown, 13 images]
[im 11/130  mediastinal]
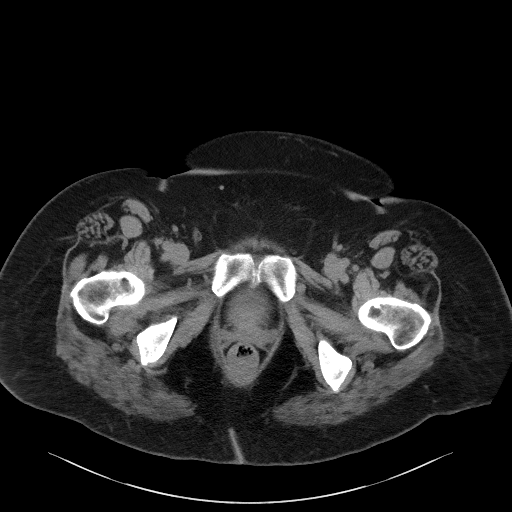
[im 11/130  lung]
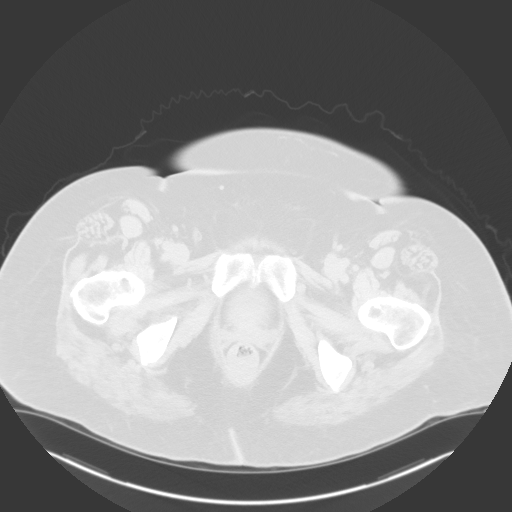
[im 22/130  lung]
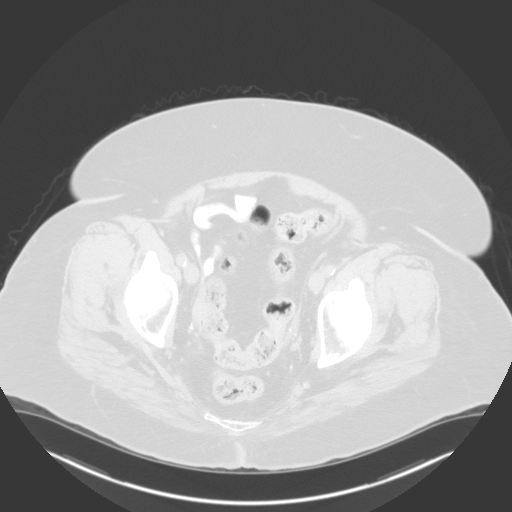
[im 33/130  lung]
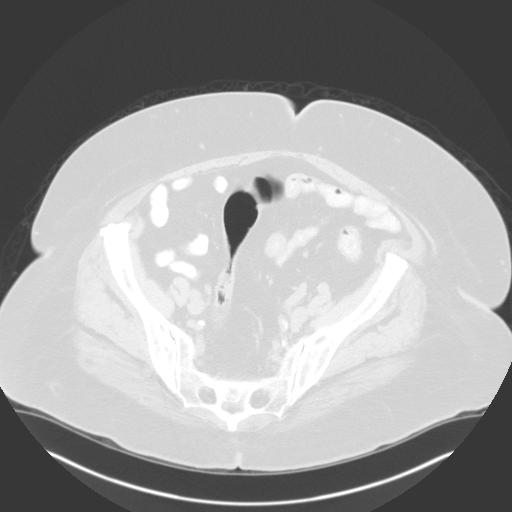
[im 44/130  lung]
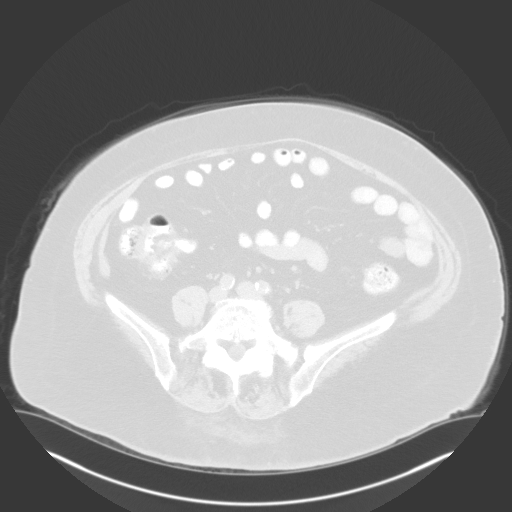
[im 54/130  mediastinal]
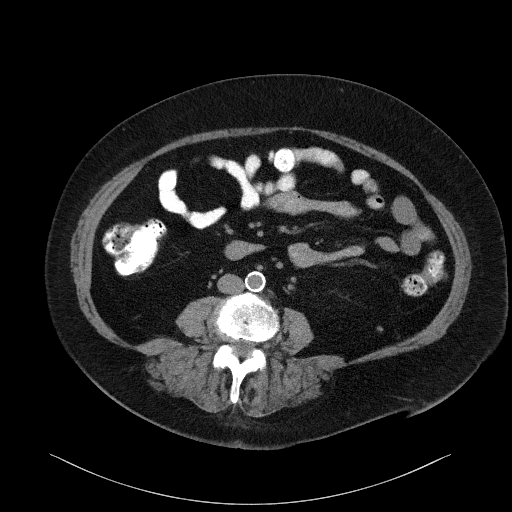
[im 54/130  lung]
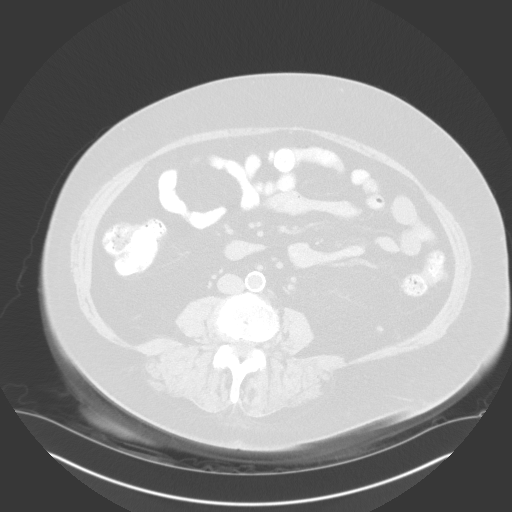
[im 76/130  lung]
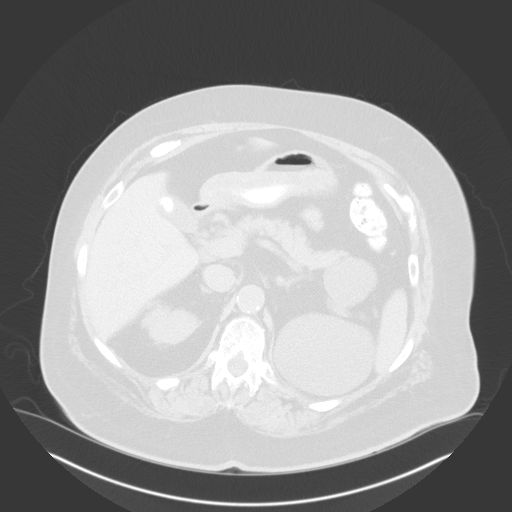
[im 87/130  lung]
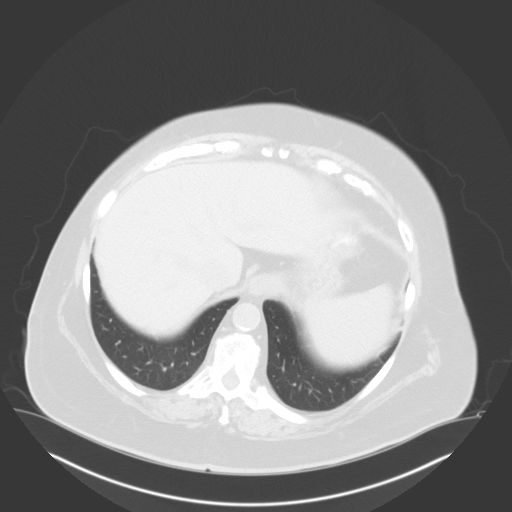
[im 97/130  lung]
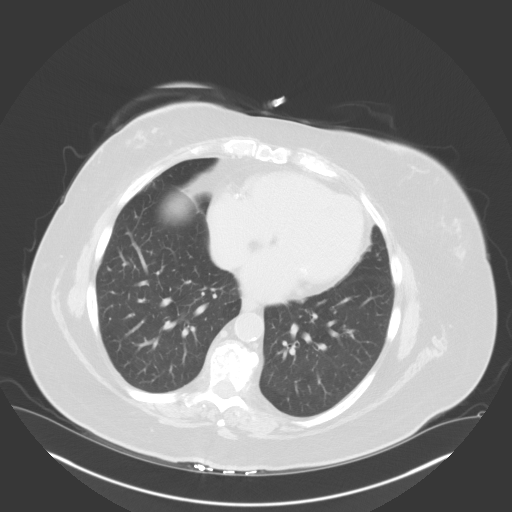
[im 108/130  mediastinal]
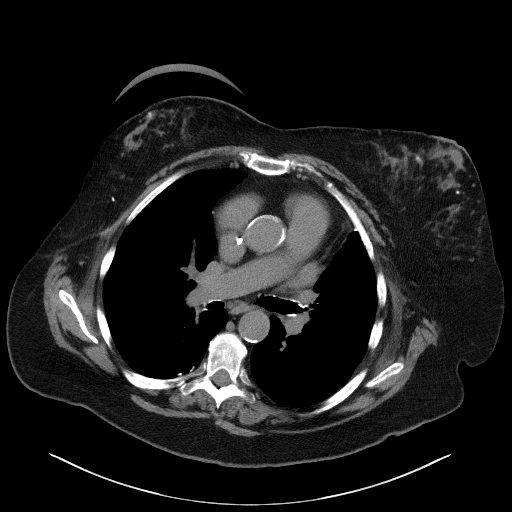
[im 108/130  lung]
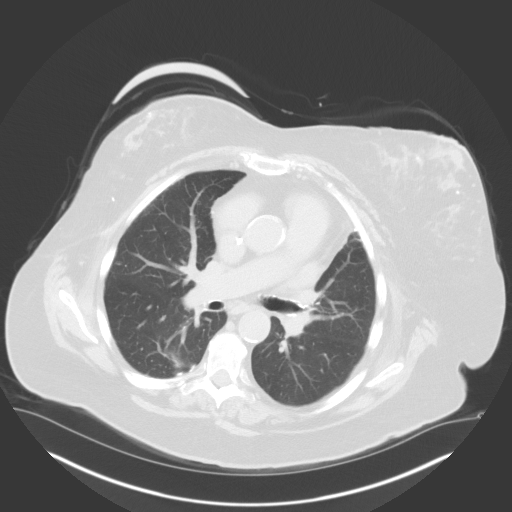
[im 119/130  lung]
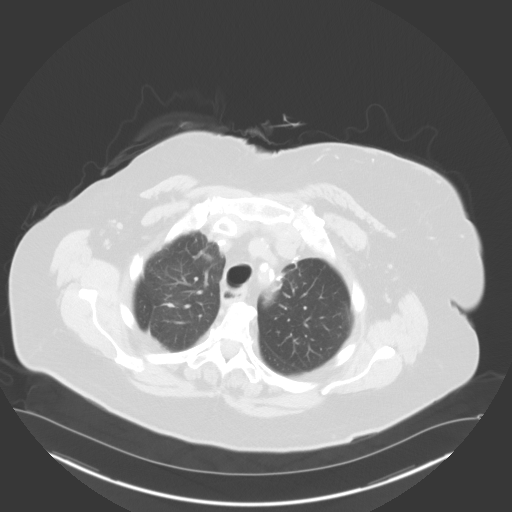

[Series 5: coronals · coronal · 0.92mm/px · 3 of 157 slices shown]
[im 32/157  lung]
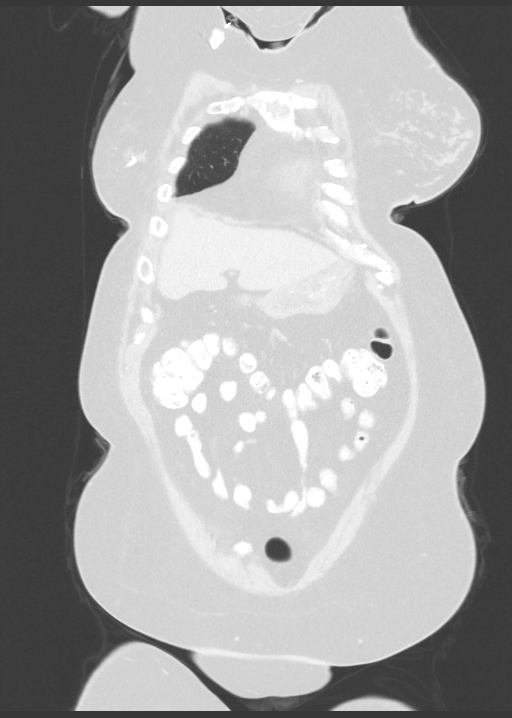
[im 63/157  lung]
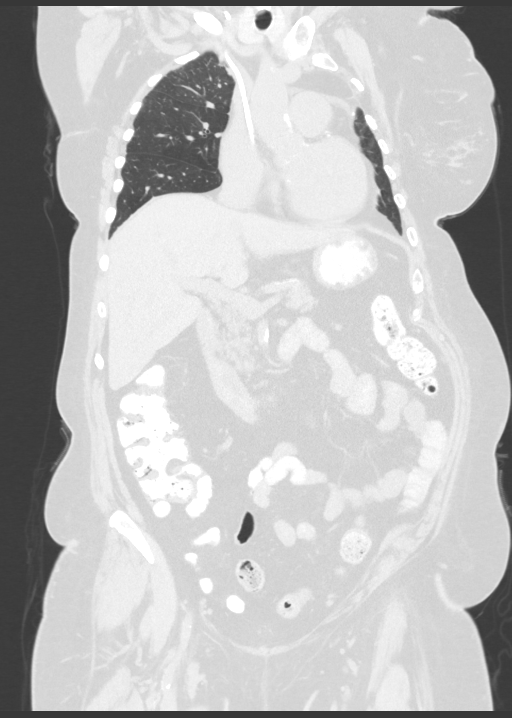
[im 94/157  lung]
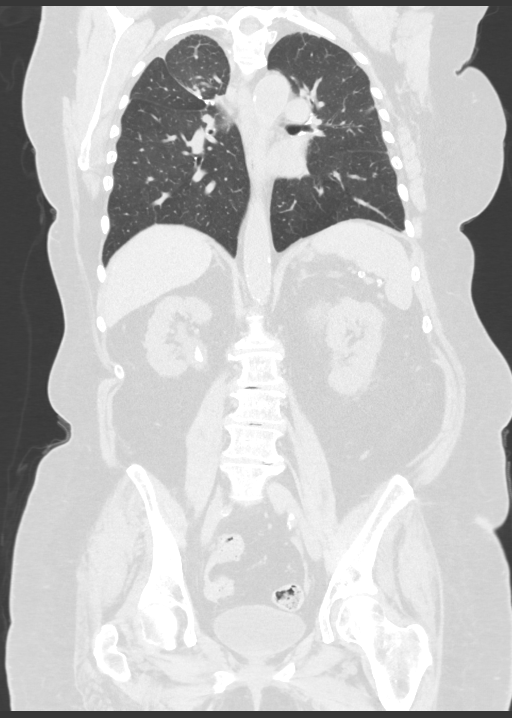

[13 of 36 positions shown; findings below may reference images not displayed]

Imaging Indication: Routine surveillance

Interval therapy since last imaging? No

Initial Cancer Diagnosis

Date: 10/01/2011; established by: Biopsy-proven

Detailed Pathology: Stage IA non-small cell lung cancer consistent
with adenocarcinoma.

Primary Tumor location: Left lung.

Recurrence? Yes; Stage IIIa non-small cell lung cancer,
adenocarcinoma involving the right upper and right lower lobes.

Date(s) of recurrence: 07/15/2014; Established by: Biopsy-proven

Surgeries: Right lower lobe wedge resection and right upper lobe
segmentectomy 07/15/2014. Left lung wedge resection 10/01/2011.
Right breast lumpectomy 12/17/2013. Partial hysterectomy.

Chemotherapy: Yes; Ongoing?  No; Most recent administration: 4035

Immunotherapy?  Yes; Type: Ketruda; Ongoing? No

Radiation therapy? No

Other Cancer Therapies: Metastatic colon adenocarcinoma of the
ascending colon with liver metastasis diagnosed in September 2016.
Right breast invasive ductal carcinoma with lumpectomy; radiotherapy
[DATE].

EXAM:
CT CHEST, ABDOMEN AND PELVIS WITHOUT CONTRAST
FINDINGS: CT CHEST FINDINGS

Cardiovascular: Normal heart size. No significant pericardial
effusion/thickening. Left main and 3 vessel coronary
atherosclerosis. Atherosclerotic nonaneurysmal thoracic aorta.
Top-normal caliber main pulmonary artery (3.3 cm diameter), stable.
Right internal jugular Port-A-Cath terminates at the cavoatrial
junction.

Mediastinum/Nodes: No discrete thyroid nodules. Unremarkable
esophagus. Surgical clips again noted in the right axilla. No
pathologically enlarged axillary, mediastinal or hilar lymph nodes.

Lungs/Pleura: No pneumothorax. No pleural effusion. Postsurgical
changes from wedge resections in the posterior right upper lobe,
superior segment right lower lobe and left upper lobe. Subsolid
x 1.2 cm posterior right lower lobe pulmonary nodule with 0.9 cm
solid component (series 4/image 92), previously 2.2 x 1.2 cm with
0.9 cm solid component on 08/01/2020 CT using similar measurement
technique, stable. Tiny 0.5 cm ground-glass pulmonary nodule in the
superior segment left lower lobe (series 4/image 35), previously
cm, stable. No acute consolidative airspace disease or new
significant pulmonary nodules.

Musculoskeletal: No aggressive appearing focal osseous lesions.
Moderate thoracic spondylosis

CT ABDOMEN PELVIS FINDINGS

Hepatobiliary: Normal liver with no liver mass. Cholelithiasis. No
biliary ductal dilatation.

Pancreas: Normal, with no mass or duct dilation.

Spleen: Normal size. No mass.

Adrenals/Urinary Tract: Normal adrenals. Nonobstructing 9 x 4 mm
right renal pelvis stone. Nonobstructing 2 mm lower left renal
stone. Numerous simple left renal cysts, largest an exophytic 9.4 cm
posterior interpolar left renal cyst. Normal bladder.

Stomach/Bowel: Normal non-distended stomach. Normal caliber small
bowel with no small bowel wall thickening. Normal appendix. Oral
contrast transits to the left colon. Normal large bowel with no
diverticulosis, large bowel wall thickening or pericolonic fat
stranding.

Vascular/Lymphatic: Atherosclerotic nonaneurysmal abdominal aorta.
No pathologically enlarged lymph nodes in the abdomen or pelvis.

Reproductive: Status post hysterectomy, with no abnormal findings at
the vaginal cuff. No adnexal mass.

Other: No pneumoperitoneum, ascites or focal fluid collection.

Musculoskeletal: No aggressive appearing focal osseous lesions.
Moderate lumbar spondylosis.
IMPRESSION: 1. Stable exam. Subsolid 2.2 cm posterior right lower lobe pulmonary
nodule with 0.9 cm solid component, stable since 08/01/2020 CT. No
new or progressive findings in the chest. No evidence of local tumor
recurrence at the wedge resection sites in the lungs bilaterally.
2. No evidence of metastatic disease in the abdomen or pelvis.
3. Left main and 3 vessel coronary atherosclerosis.
4. Cholelithiasis.
5. Nonobstructing bilateral nephrolithiasis.
6. Aortic Atherosclerosis ([id number]).

## 2022-09-14 ENCOUNTER — Encounter: Payer: Self-pay | Admitting: Internal Medicine

## 2022-09-21 ENCOUNTER — Encounter: Payer: Self-pay | Admitting: Internal Medicine

## 2023-01-16 IMAGING — US US BREAST*L* LIMITED INC AXILLA
1 series · 11 of 11 positions shown · non-contrast
Comparison: Previous exam(s).

CLINICAL DATA: Mass felt by the patient in the inner left breast
for the past 2 months. Status post right lumpectomy and radiation
therapy for breast cancer in 0633.

EXAM:
DIGITAL DIAGNOSTIC BILATERAL MAMMOGRAM WITH TOMOSYNTHESIS AND CAD;
ULTRASOUND LEFT BREAST LIMITED
TECHNIQUE: Bilateral digital diagnostic mammography and breast tomosynthesis
was performed. The images were evaluated with computer-aided
detection.; Targeted ultrasound examination of the left breast was
performed

[Series 1: us breast*left* limited inc axilla · 0.07mm/px · 11 of 11 slices shown]
[im 1/11]
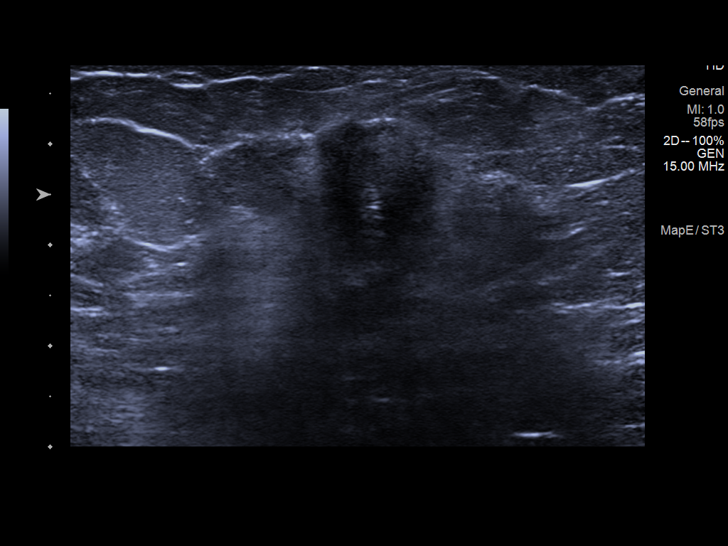
[im 2/11]
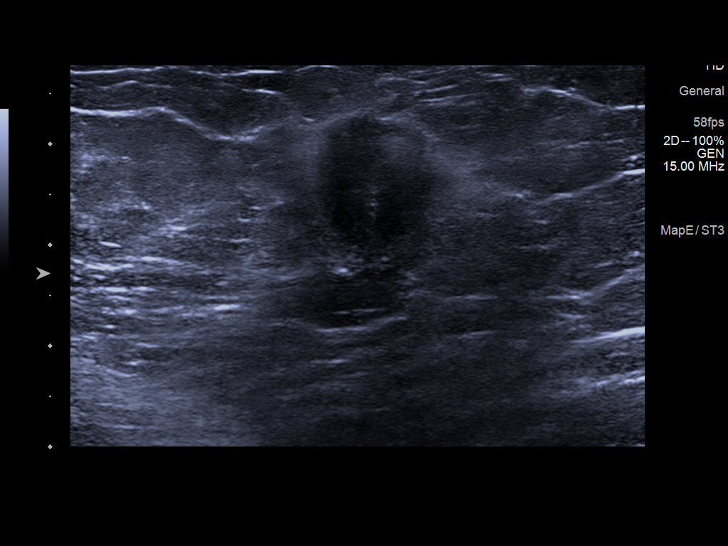
[im 3/11]
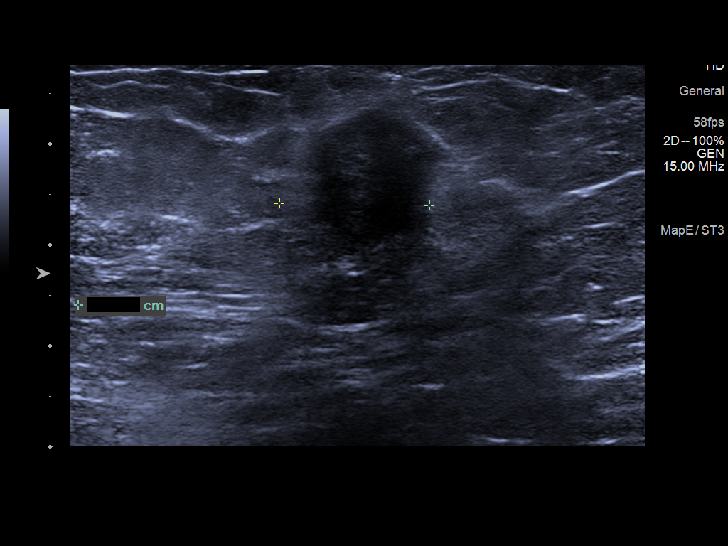
[im 4/11]
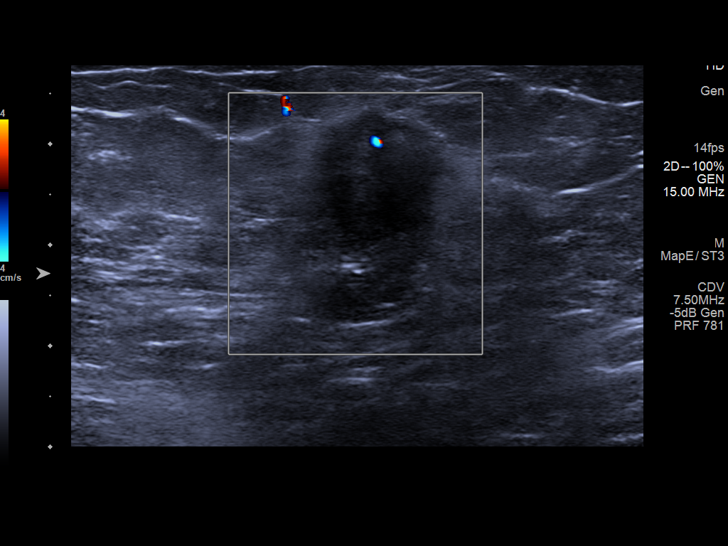
[im 5/11]
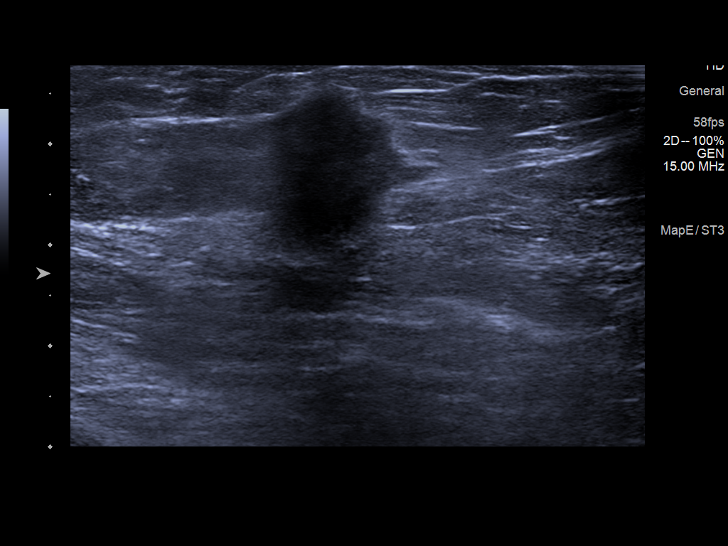
[im 6/11]
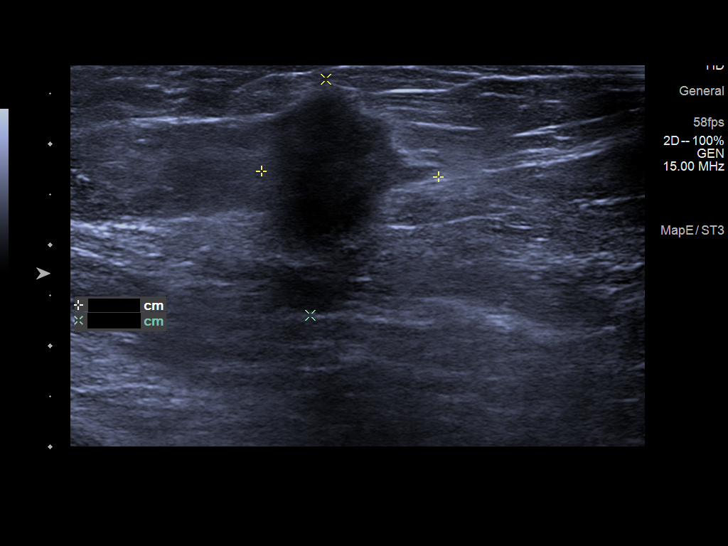
[im 7/11]
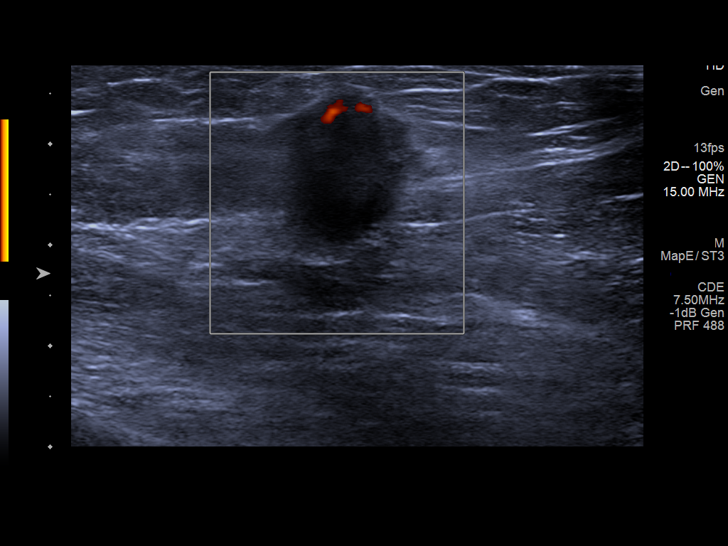
[im 8/11]
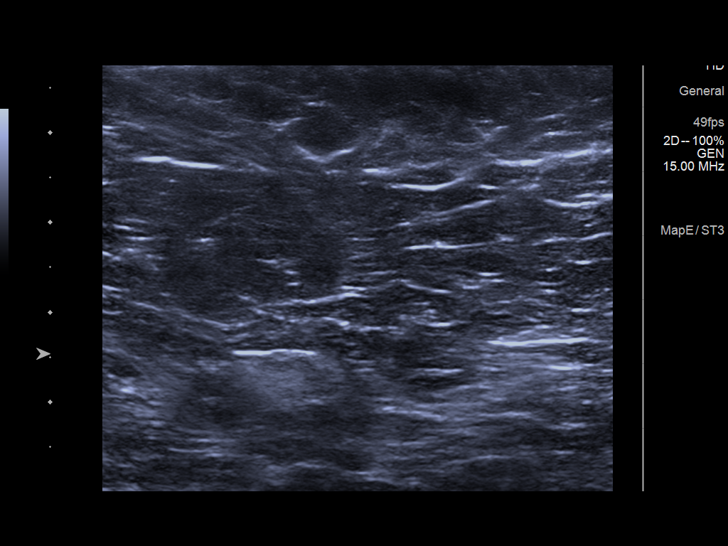
[im 9/11]
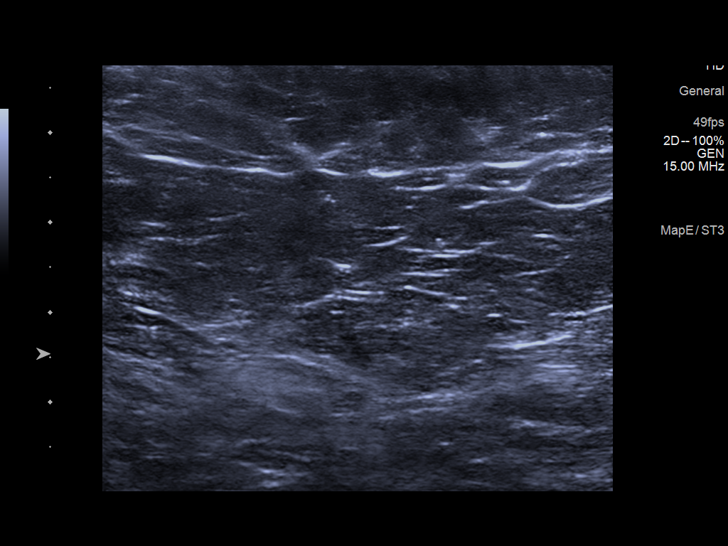
[im 10/11]
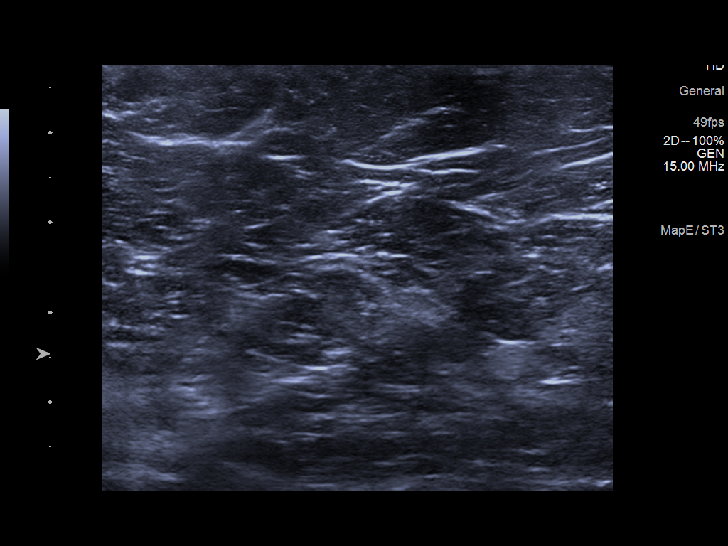
[im 11/11]
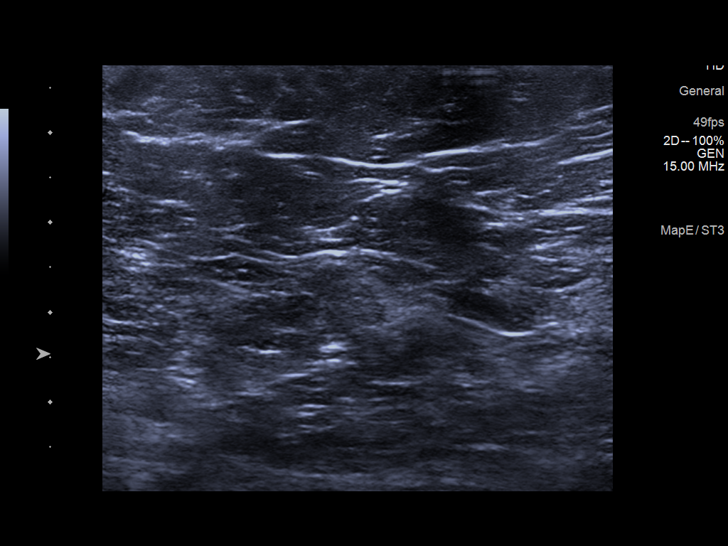

[11 of 11 positions shown; findings below may reference images not displayed]

ACR Breast Density Category c: The breast tissue is heterogeneously
dense, which may obscure small masses.
FINDINGS: There is an irregular, spiculated mass in medial left breast
corresponding to the palpable mass, marked with a metallic marker.
No findings elsewhere in either breast suspicious for malignancy.
Stable post lumpectomy and postradiation changes on the right.

On physical exam, the patient has an approximately 1.5 cm firm,
rounded, palpable mass in the 9 o'clock position of the right
breast, 7 cm from the nipple. There are no palpable left axillary
lymph nodes.

Targeted ultrasound is performed, showing a 1.8 x 1.5 x 1.4 cm
vertically oriented, irregular, hypoechoic mass with indistinct
margins and some posterior acoustical shadowing 9 o'clock position
of the left breast, 7 cm from the nipple.

Ultrasound of the left axilla demonstrated normal appearing left
axillary lymph nodes.
IMPRESSION: 1. 1.8 cm palpable mass with imaging features highly suspicious for
malignancy in 9 o'clock position of the left breast.
2. No evidence of malignancy elsewhere in either breast

RECOMMENDATION:
Ultrasound-guided core needle biopsy of the 1.8 cm mass in the 9
o'clock position of the left breast. This has been discussed with
the patient and she is being assisted in getting this scheduled.

I have discussed the findings and recommendations with the patient.
If applicable, a reminder letter will be sent to the patient
regarding the next appointment.

BI-RADS CATEGORY  5: Highly suggestive of malignancy.

## 2023-01-16 IMAGING — MG DIGITAL DIAGNOSTIC BILAT W/ TOMO W/ CAD
6 of 10 series · 6 of 30 positions shown · non-contrast
Comparison: Previous exam(s).

CLINICAL DATA: Mass felt by the patient in the inner left breast
for the past 2 months. Status post right lumpectomy and radiation
therapy for breast cancer in 0633.

EXAM:
DIGITAL DIAGNOSTIC BILATERAL MAMMOGRAM WITH TOMOSYNTHESIS AND CAD;
ULTRASOUND LEFT BREAST LIMITED
TECHNIQUE: Bilateral digital diagnostic mammography and breast tomosynthesis
was performed. The images were evaluated with computer-aided
detection.; Targeted ultrasound examination of the left breast was
performed

[L CC synth-2D]
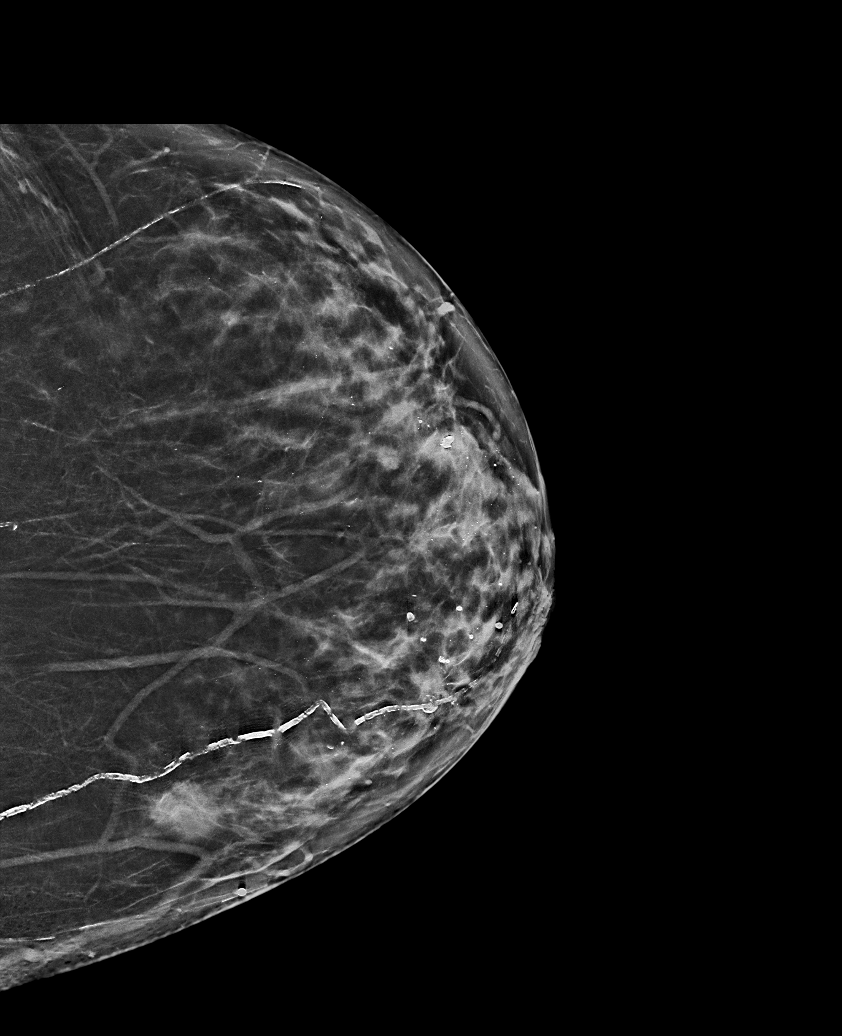

[L TAN synth-2D]
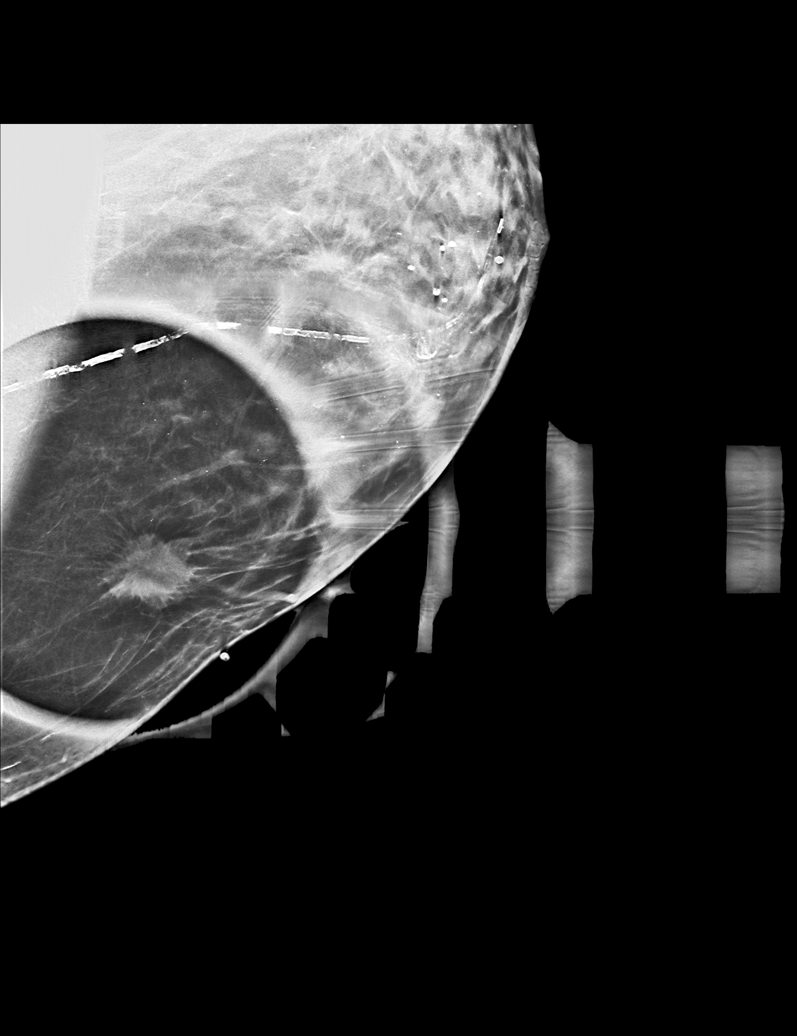

[R MLO synth-2D]
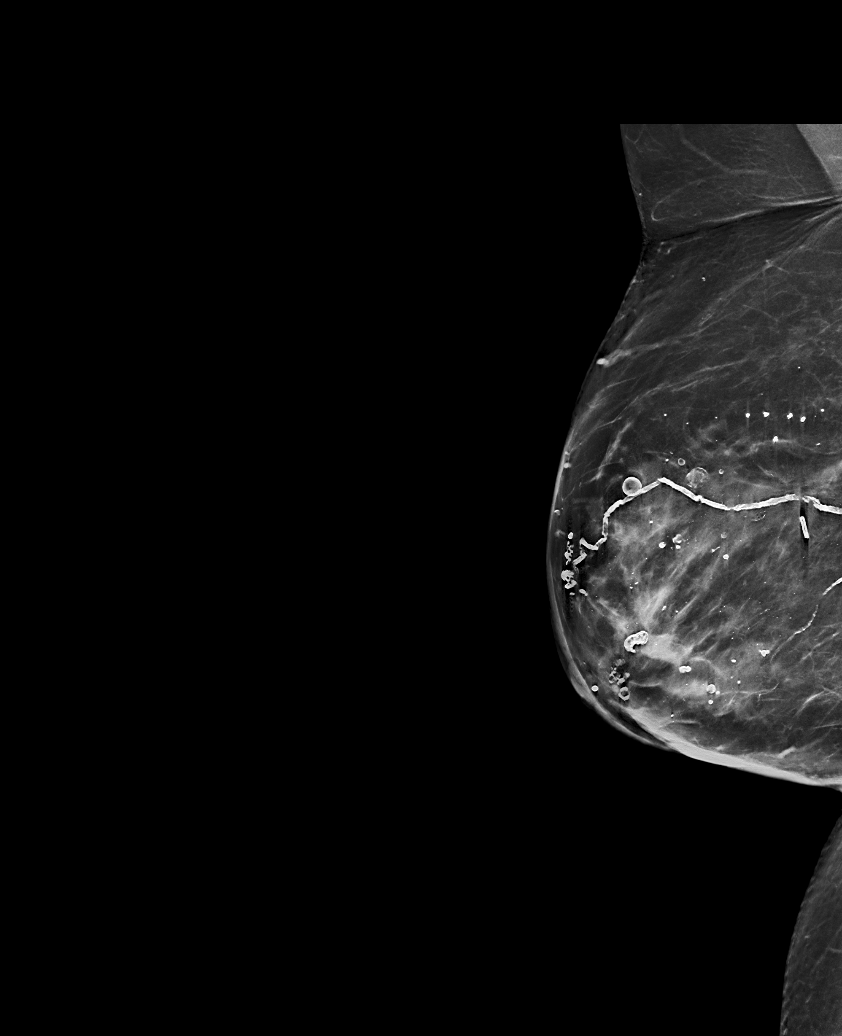

[R CC synth-2D]
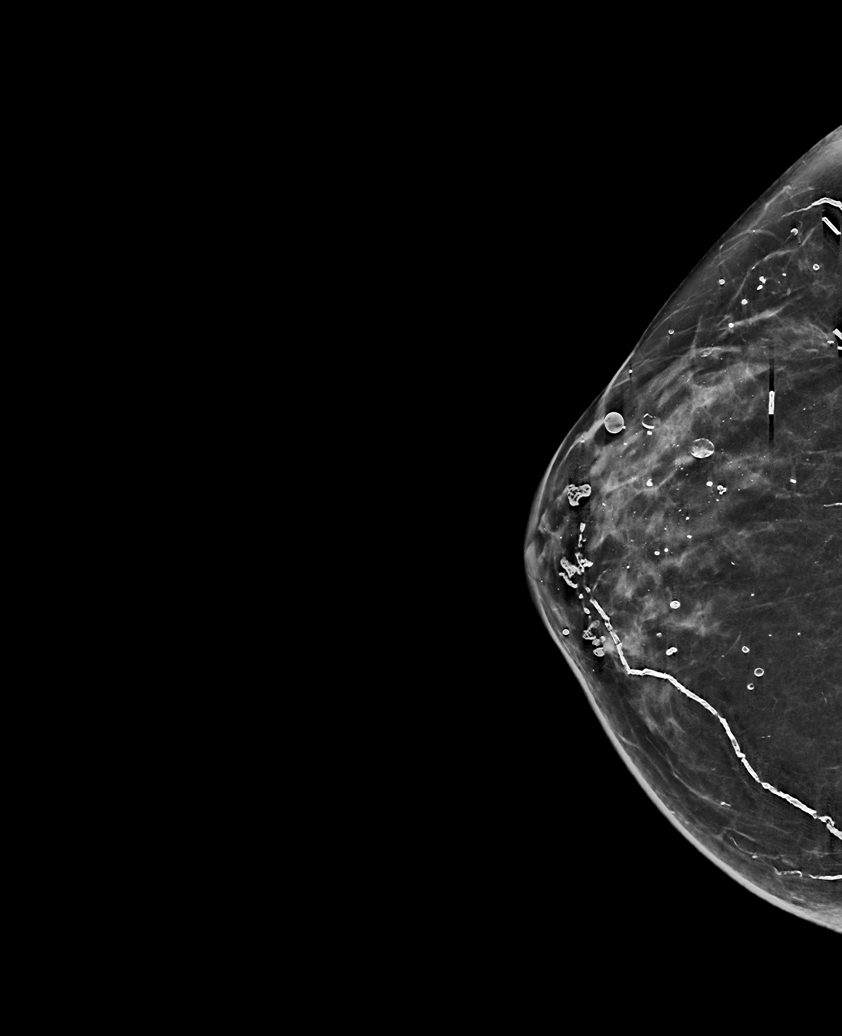

[L MLO synth-2D]
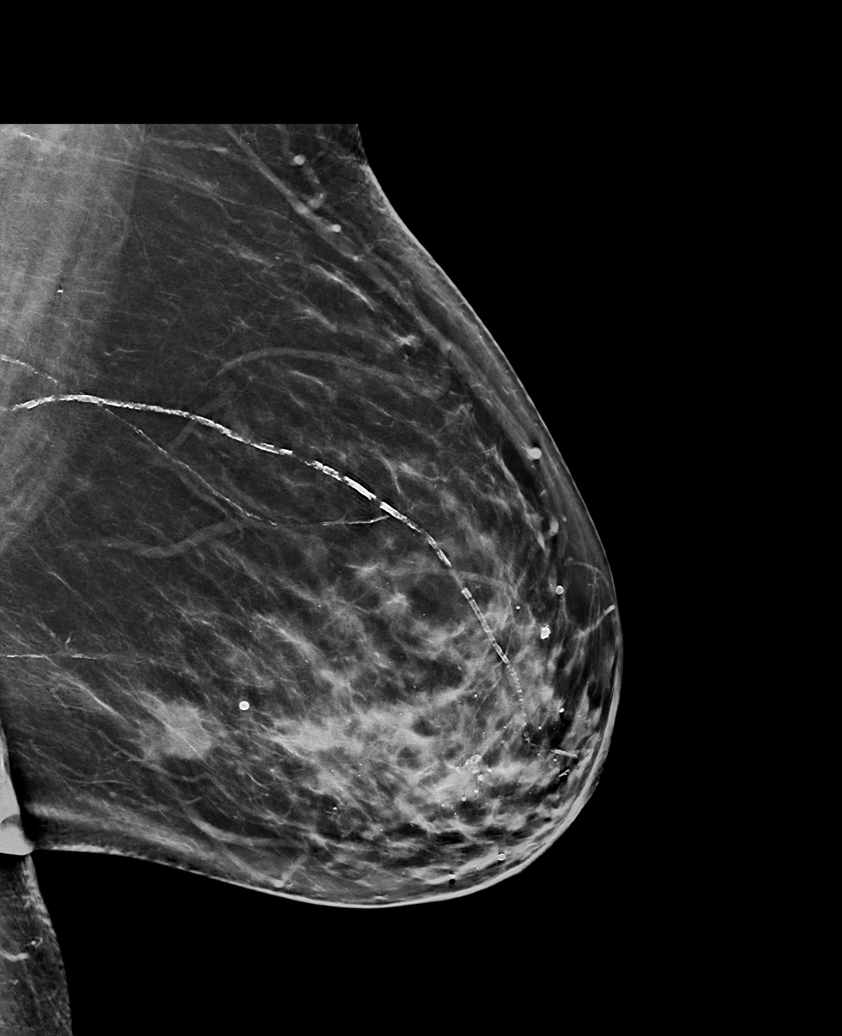

[R MLO tomo · tomo slice 39/78.0]
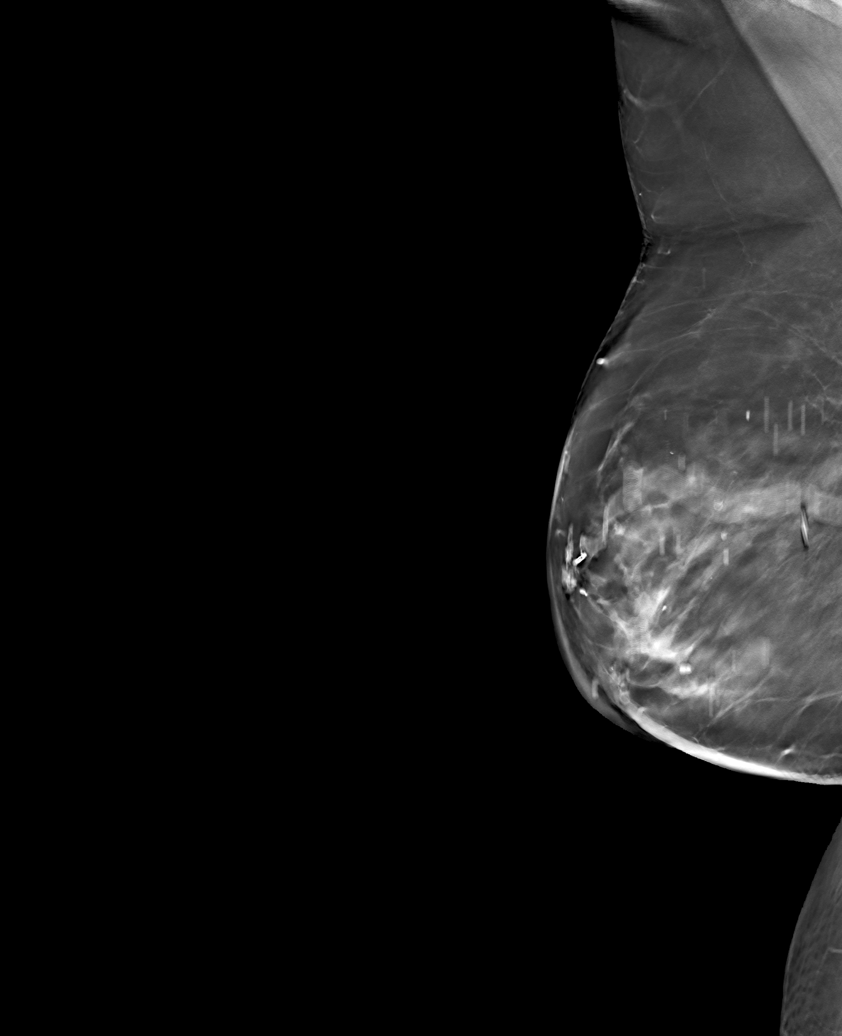

[6 of 30 positions shown; findings below may reference images not displayed]

ACR Breast Density Category c: The breast tissue is heterogeneously
dense, which may obscure small masses.
FINDINGS: There is an irregular, spiculated mass in medial left breast
corresponding to the palpable mass, marked with a metallic marker.
No findings elsewhere in either breast suspicious for malignancy.
Stable post lumpectomy and postradiation changes on the right.

On physical exam, the patient has an approximately 1.5 cm firm,
rounded, palpable mass in the 9 o'clock position of the right
breast, 7 cm from the nipple. There are no palpable left axillary
lymph nodes.

Targeted ultrasound is performed, showing a 1.8 x 1.5 x 1.4 cm
vertically oriented, irregular, hypoechoic mass with indistinct
margins and some posterior acoustical shadowing 9 o'clock position
of the left breast, 7 cm from the nipple.

Ultrasound of the left axilla demonstrated normal appearing left
axillary lymph nodes.
IMPRESSION: 1. 1.8 cm palpable mass with imaging features highly suspicious for
malignancy in 9 o'clock position of the left breast.
2. No evidence of malignancy elsewhere in either breast

RECOMMENDATION:
Ultrasound-guided core needle biopsy of the 1.8 cm mass in the 9
o'clock position of the left breast. This has been discussed with
the patient and she is being assisted in getting this scheduled.

I have discussed the findings and recommendations with the patient.
If applicable, a reminder letter will be sent to the patient
regarding the next appointment.

BI-RADS CATEGORY  5: Highly suggestive of malignancy.

## 2023-01-30 IMAGING — MG MM BREAST LOCALIZATION CLIP
4 series · 4 of 12 positions shown · non-contrast
Comparison: Previous exams.

CLINICAL DATA: Post ultrasound-guided biopsy of a palpable mass in
the left breast at the 9 o'clock position.

EXAM:
3D DIAGNOSTIC LEFT MAMMOGRAM POST ULTRASOUND BIOPSY

[L ML synth-2D]
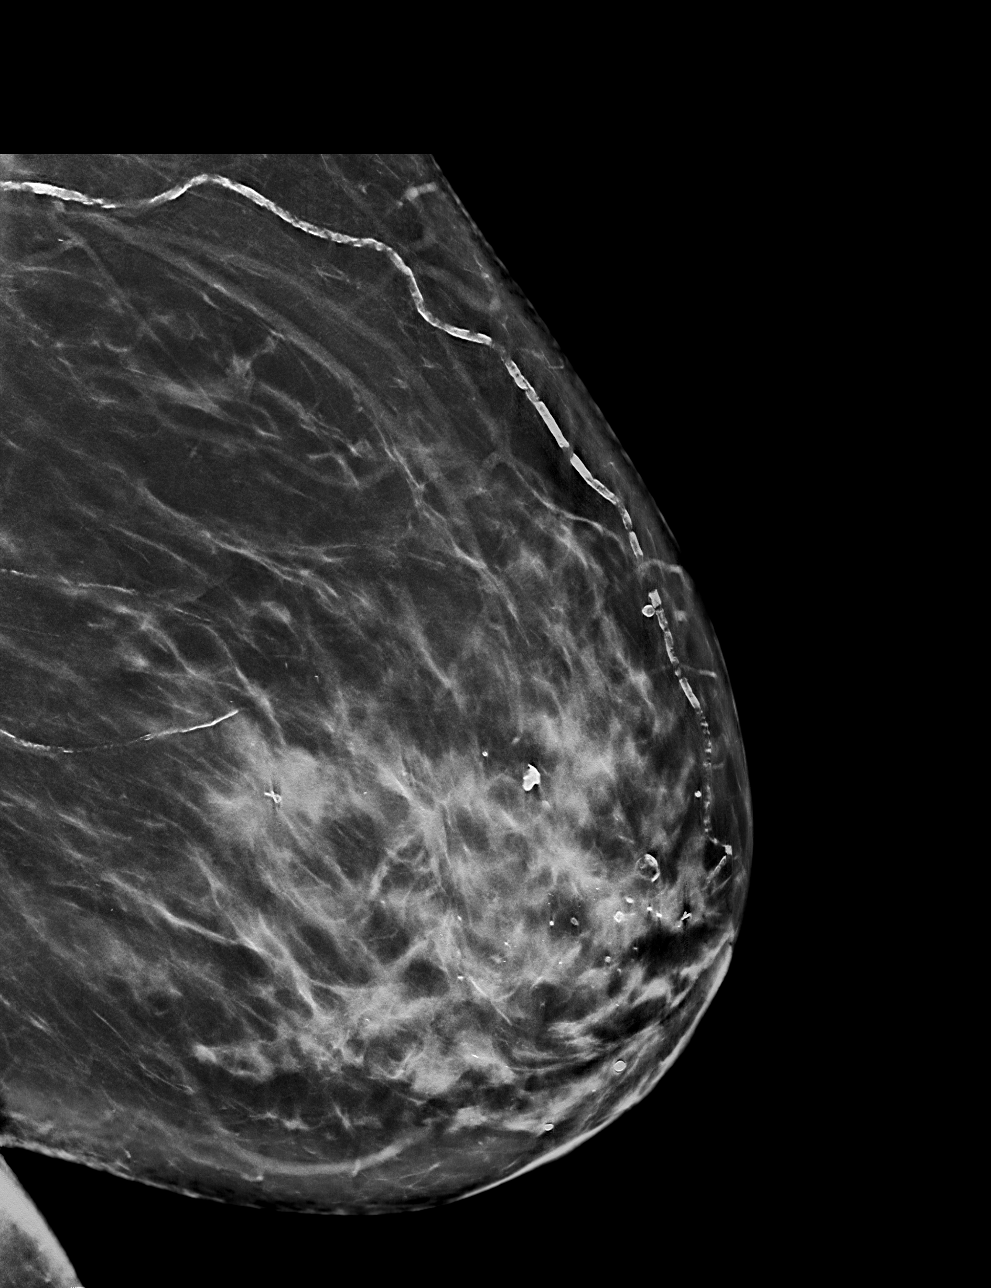

[L CC synth-2D]
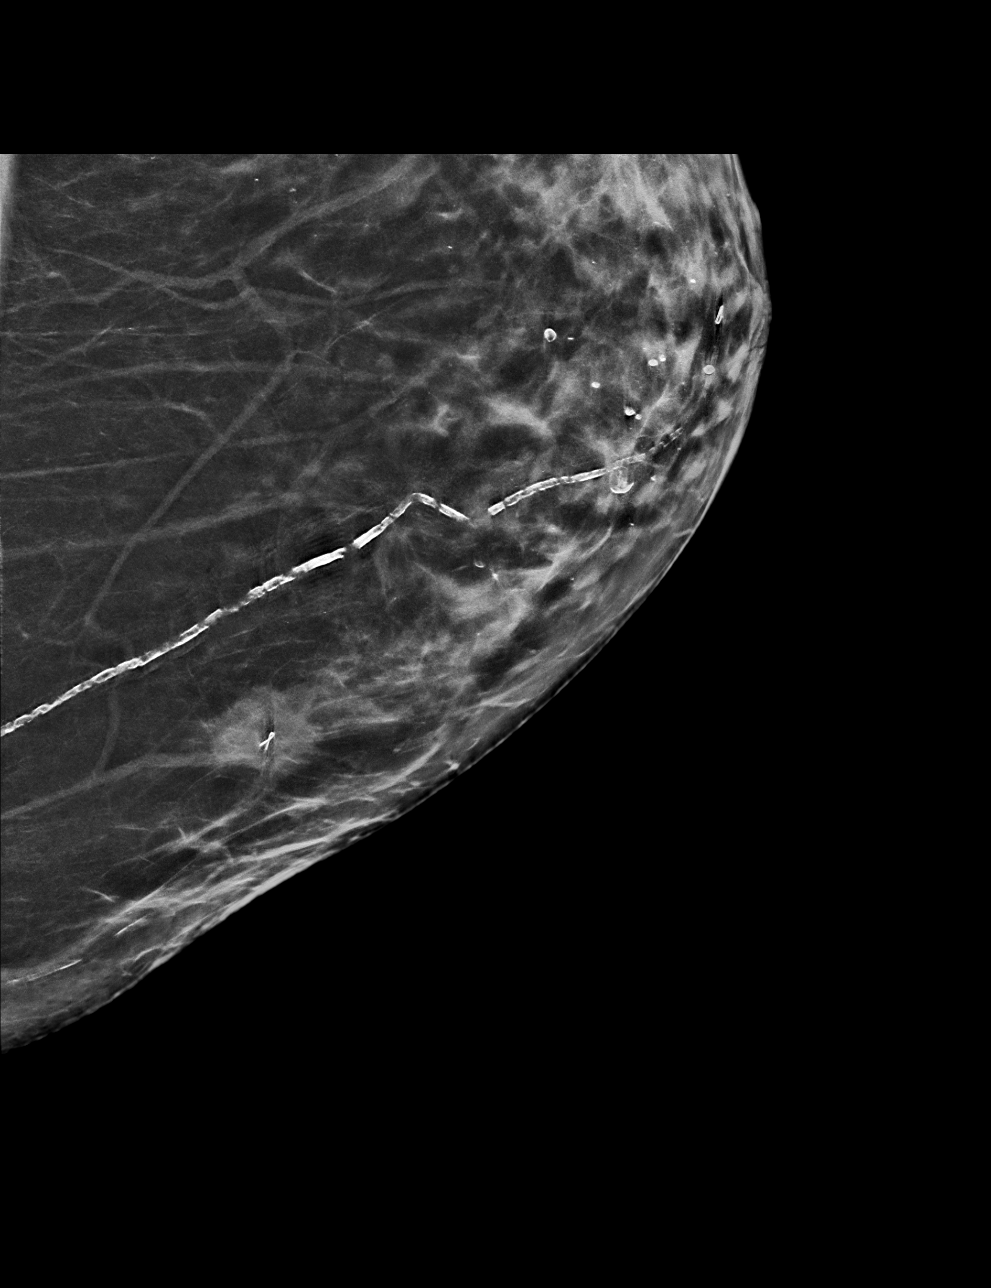

[L ML tomo · tomo slice 43/86.0]
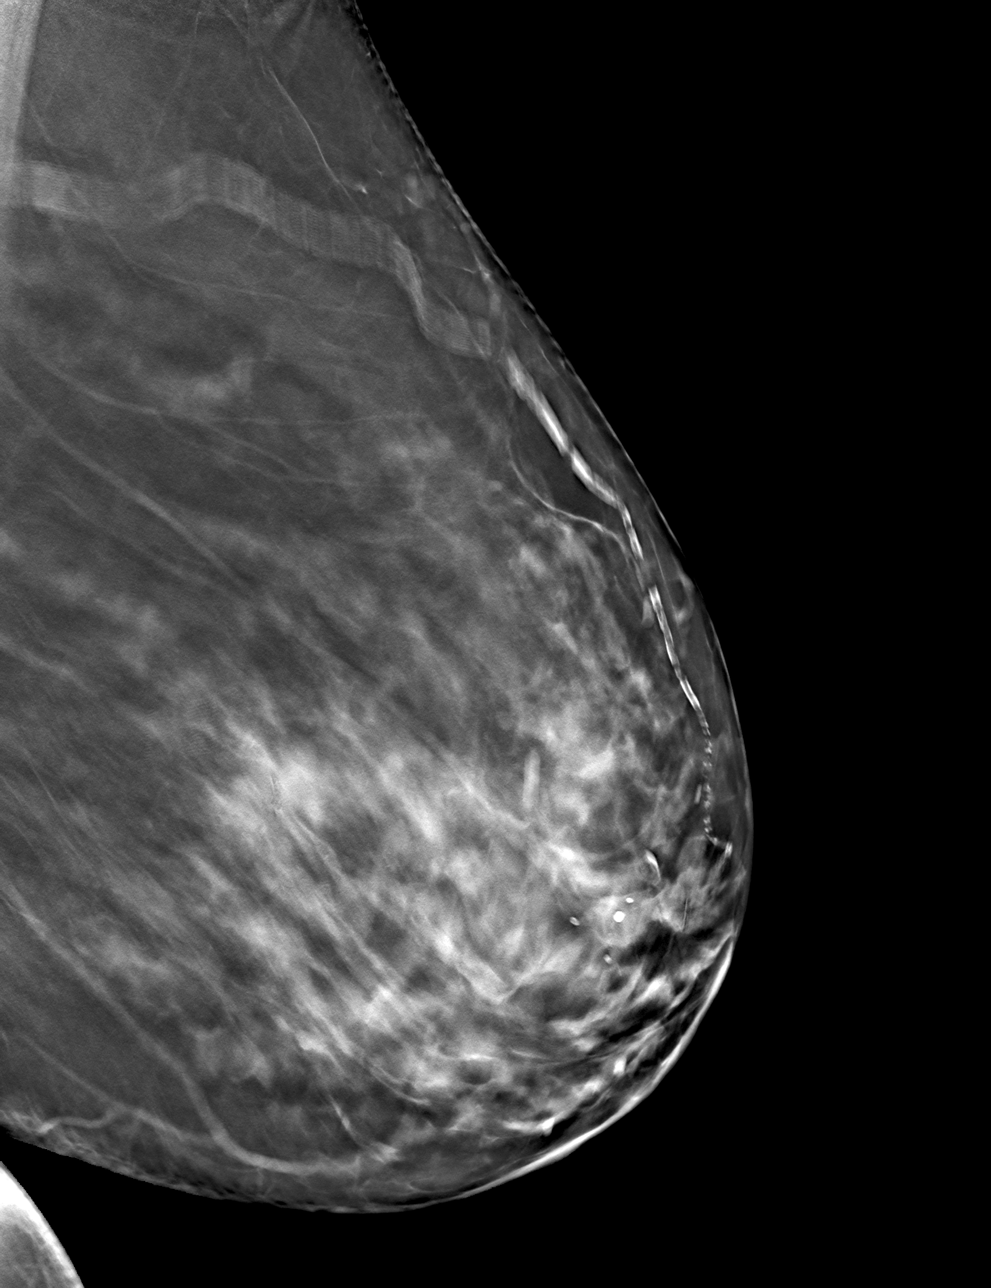

[L CC tomo · tomo slice 35/69.0]
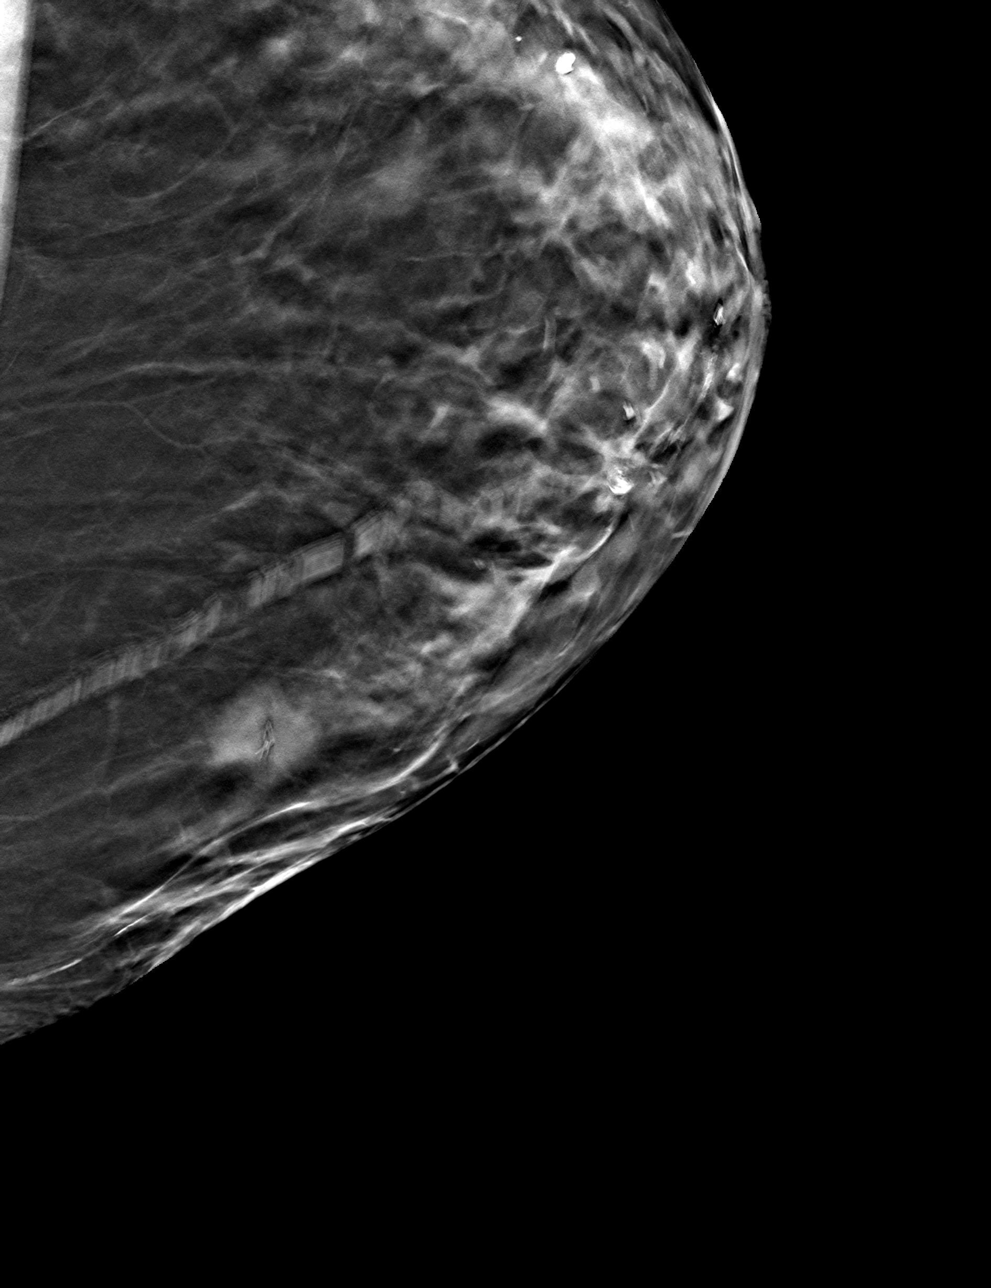

[4 of 12 positions shown; findings below may reference images not displayed]

FINDINGS: 3D Mammographic images were obtained following ultrasound-guided
biopsy of a mass in the left breast at the 9 o'clock position. A
ribbon shaped biopsy marking clip is present at the site of the
biopsied mass in the left breast at the 9 o'clock position.
IMPRESSION: Ribbon shaped biopsy marking clip at site of biopsied mass in the
left breast at the 9 o'clock position.

Final Assessment: Post Procedure Mammograms for Marker Placement

## 2023-01-30 IMAGING — US US BREAST BX W LOC DEV 1ST LESION IMG BX SPEC US GUIDE*L*
1 series · 8 of 8 positions shown · non-contrast
Comparison: Previous exam(s).
COMPARISON: Previous exam(s).

Addendum:
CLINICAL DATA: 79-year-old female with a suspicious 1.8 cm palpable
mass in the left breast at the 9 o'clock position.

EXAM:
ULTRASOUND GUIDED LEFT BREAST CORE NEEDLE BIOPSY

[Series 1: us breast bx w loc dev 1st lesion img bx spec us g · 0.07mm/px · 8 of 8 slices shown]
[im 1/8]
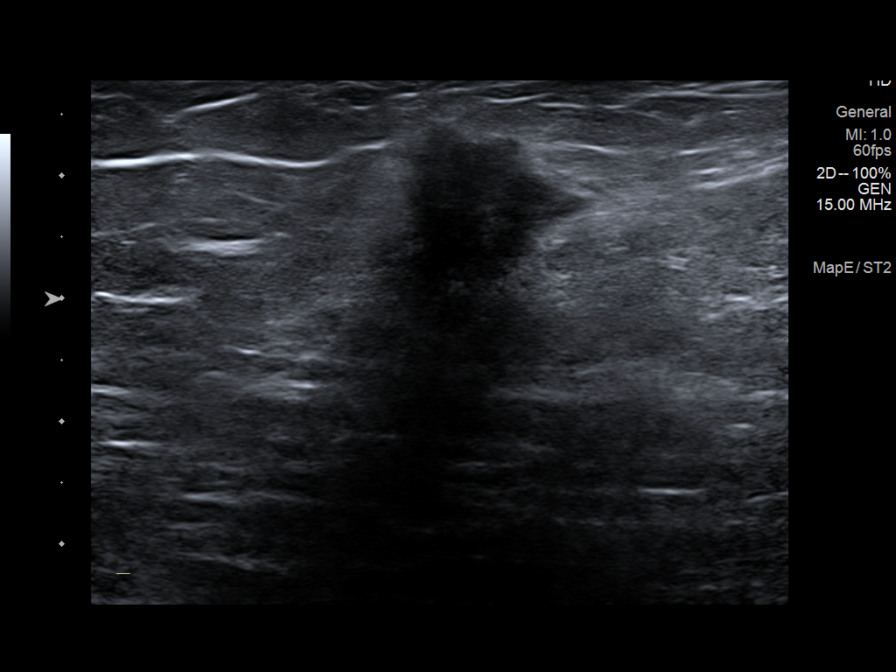
[im 2/8]
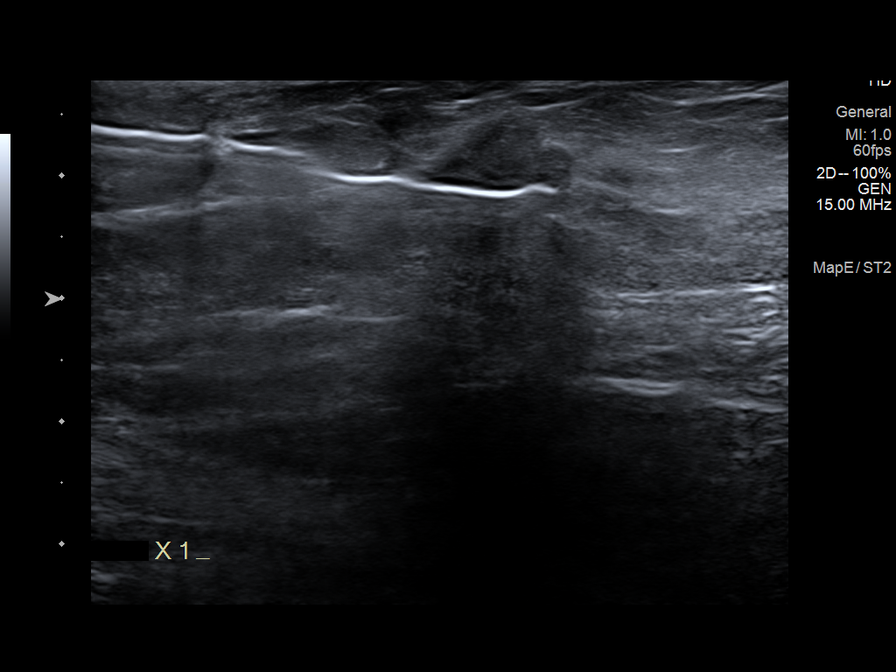
[im 3/8]
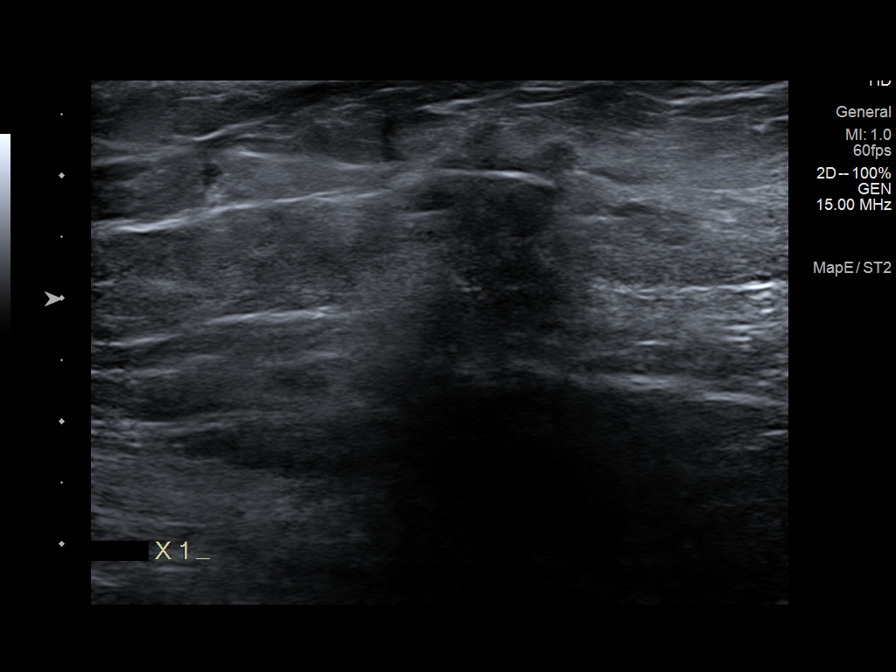
[im 4/8]
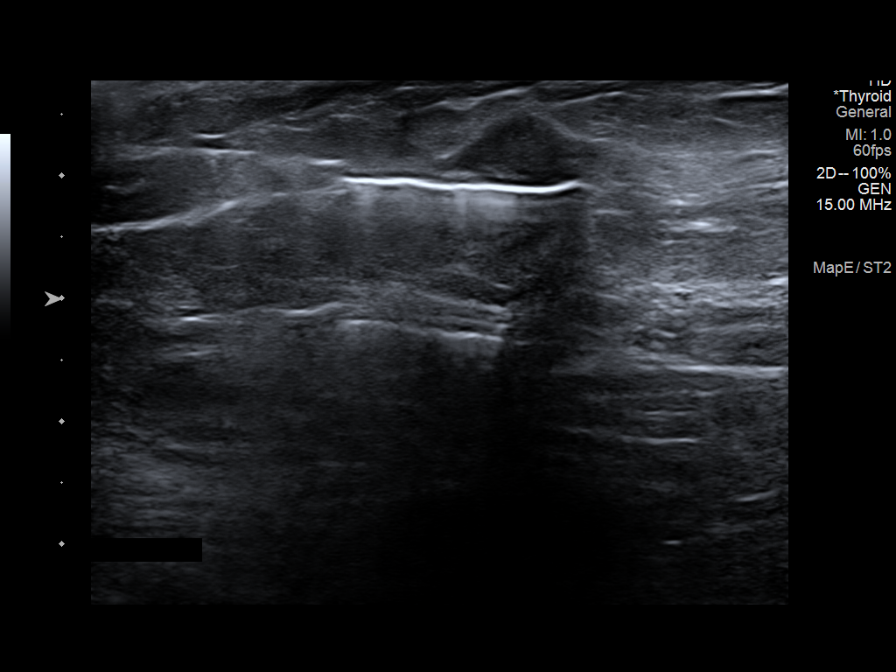
[im 5/8]
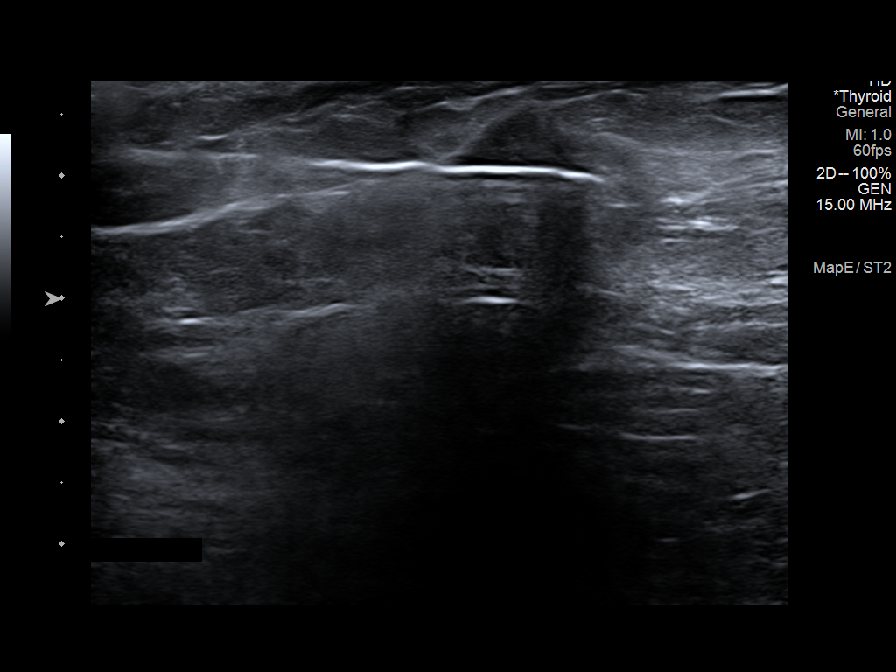
[im 6/8]
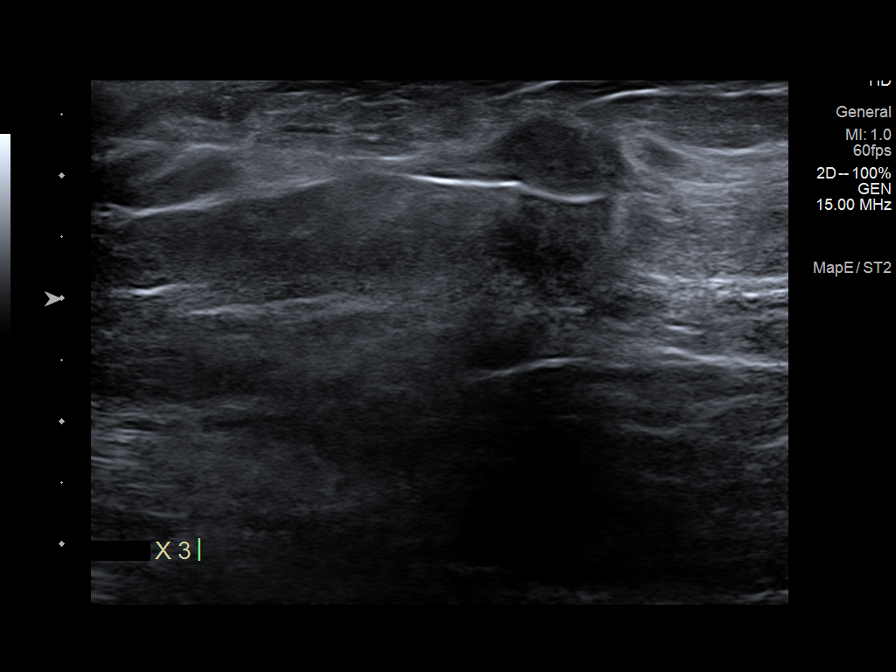
[im 7/8]
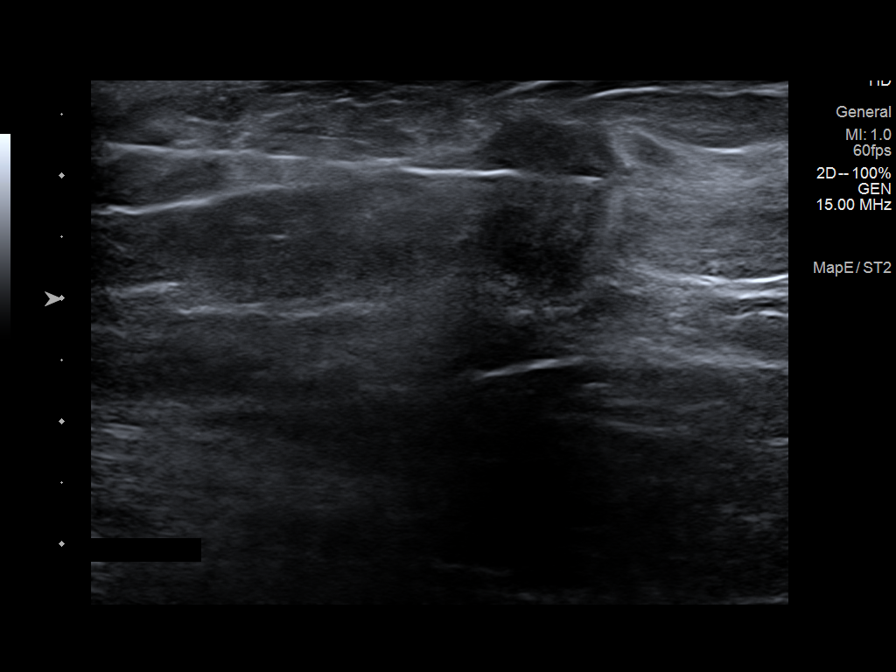
[im 8/8]
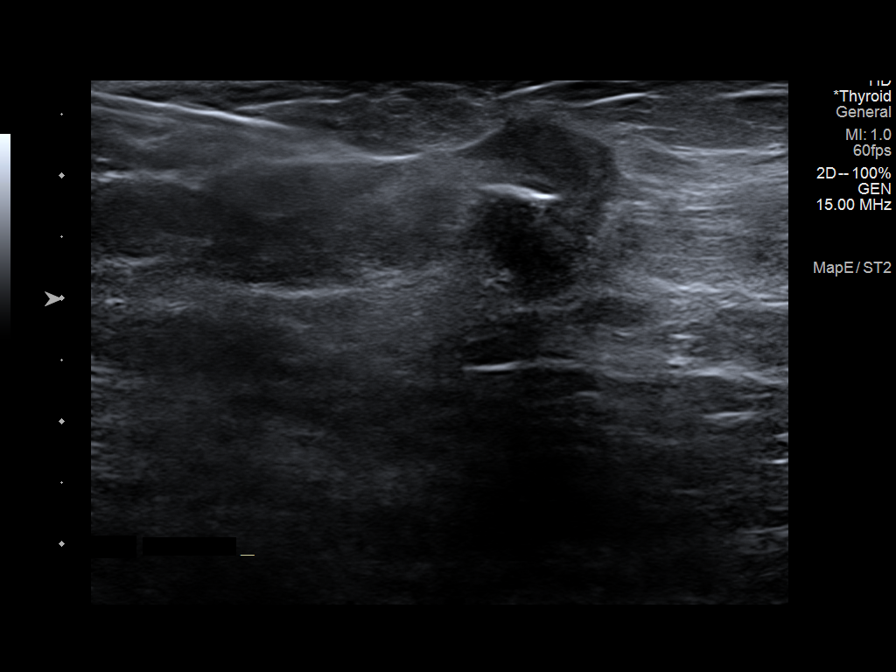

[8 of 8 positions shown; findings below may reference images not displayed]



Lesion quadrant: Lower inner

Using sterile technique and 1% Lidocaine as local anesthetic, under
direct ultrasound visualization, a 14 gauge Gatseva device was
used to perform biopsy of the mass in the left breast at the 9
o'clock position using a medial to lateral approach. At the
conclusion of the procedure a ribbon shaped tissue marker clip was
deployed into the biopsy cavity. Follow up 2 view mammogram was
performed and dictated separately.
IMPRESSION: Ultrasound guided biopsy of the mass in the left breast at the 9
o'clock position. No apparent complications.

ADDENDUM:
PATHOLOGY revealed: A. BREAST, [DATE] MASS, BIOPSY: - Invasive mammary
carcinoma with calcifications. - Mammary carcinoma in situ. COMMENT:
The greatest tumor dimension is 1.1 cm. E-cadherin and breast
prognostic profile will be performed.

Pathology results are CONCORDANT with imaging findings, per Dr.
Jerusaleme Poladian.

Pathology results and recommendations were discussed with patient
via telephone on 07/15/2021. Patient reported doing well after the
biopsy with no adverse symptoms, and only slight tenderness at the
site. Post biopsy care instructions were reviewed, questions were
answered and my direct phone number was provided. Patient was
instructed to call [HOSPITAL] [HOSPITAL] Mammography
Department for any additional questions or concerns related to
biopsy site.

Recommend surgical consultation: Request for surgical consultation
was relayed to Savio Locklear RT at [HOSPITAL] [HOSPITAL]
Mammography Department by Gopinath Viramontes RN on 07/15/2021.

Pathology results reported by Gopinath Viramontes RN on 07/15/2021.



Lesion quadrant: Lower inner

Using sterile technique and 1% Lidocaine as local anesthetic, under
direct ultrasound visualization, a 14 gauge Gatseva device was
used to perform biopsy of the mass in the left breast at the 9
o'clock position using a medial to lateral approach. At the
conclusion of the procedure a ribbon shaped tissue marker clip was
deployed into the biopsy cavity. Follow up 2 view mammogram was
performed and dictated separately.
IMPRESSION: Ultrasound guided biopsy of the mass in the left breast at the 9
o'clock position. No apparent complications.

## 2023-02-20 IMAGING — CT CT ABD-PELV W/O CM
2 of 4 series · 13 of 36 positions shown, 16 images · non-contrast
Comparison: 02/02/2021.

CLINICAL DATA: Lung cancer and breast cancer.

EXAM:
CT CHEST, ABDOMEN AND PELVIS WITHOUT CONTRAST
TECHNIQUE: Multidetector CT imaging of the chest, abdomen and pelvis was
performed following the standard protocol without IV contrast.

[Series 2: cap w/o · axial · non-contrast · 0.98mm/px · z∈[+922,+1516]mm · 10 of 141 slices shown, 13 images]
[im 11/141  mediastinal]
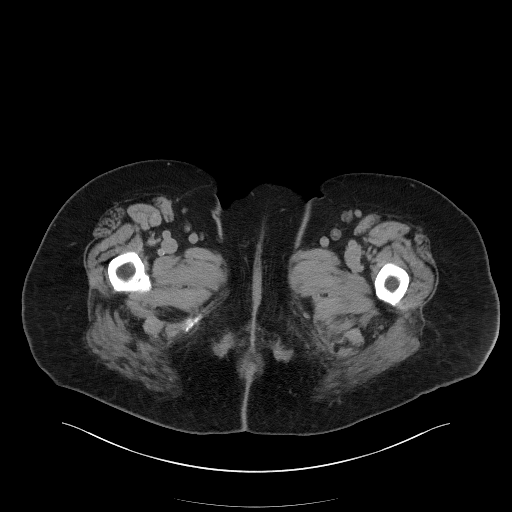
[im 11/141  lung]
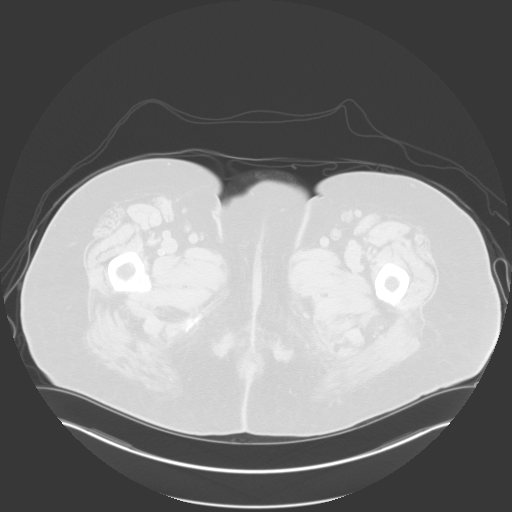
[im 22/141  lung]
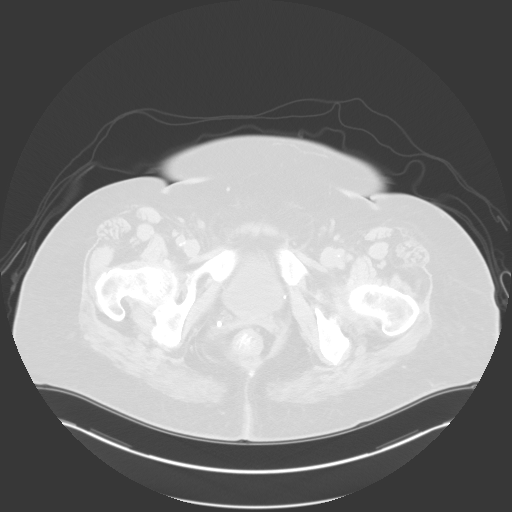
[im 44/141  lung]
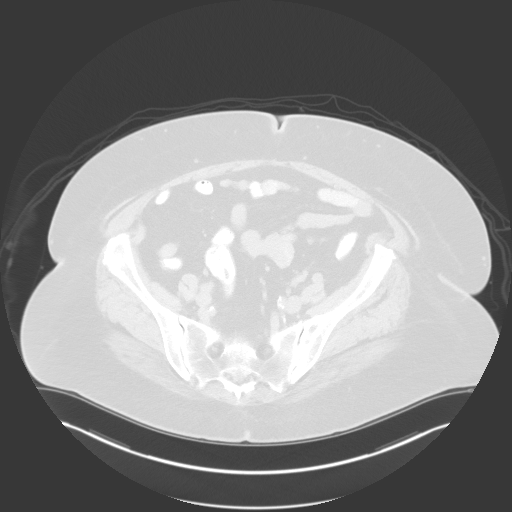
[im 54/141  lung]
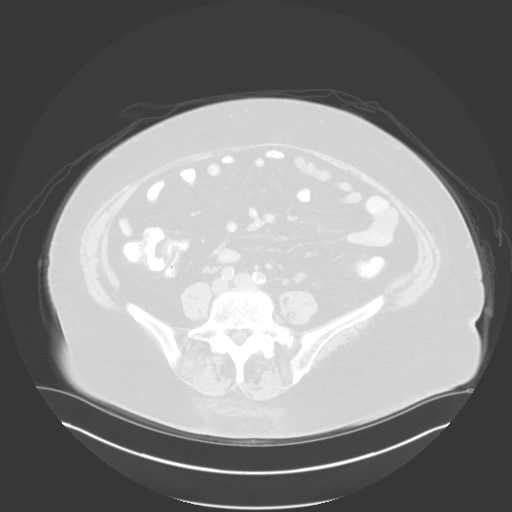
[im 65/141  mediastinal]
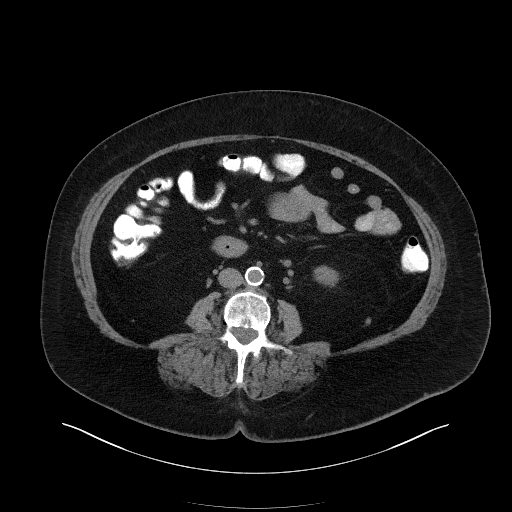
[im 65/141  lung]
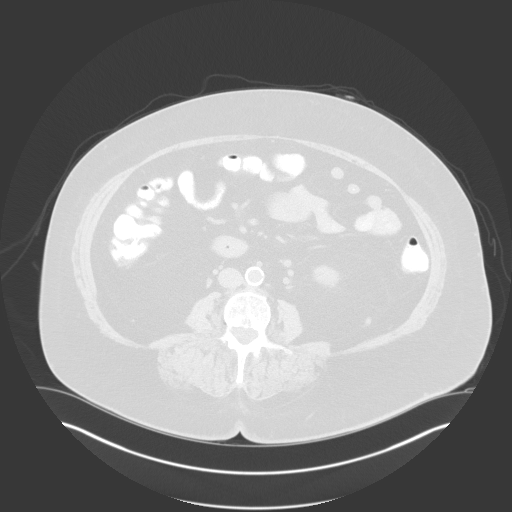
[im 76/141  lung]
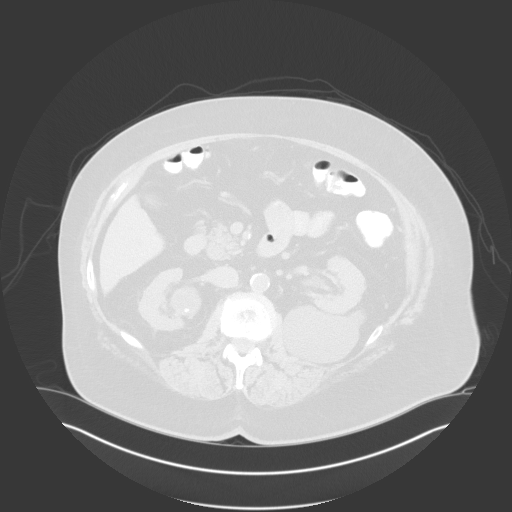
[im 87/141  lung]
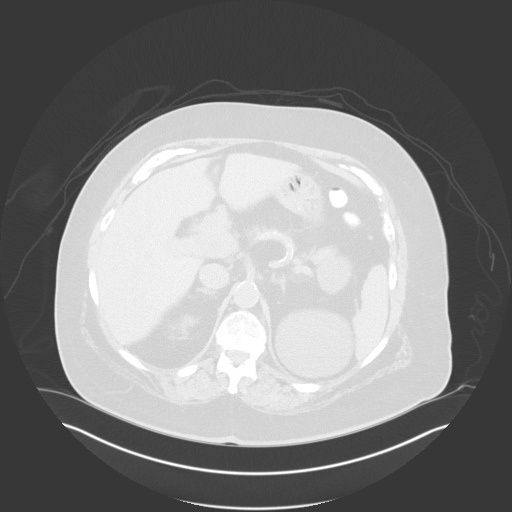
[im 108/141  lung]
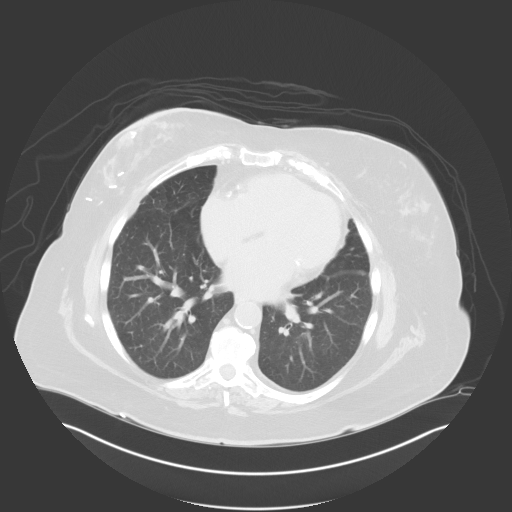
[im 119/141  mediastinal]
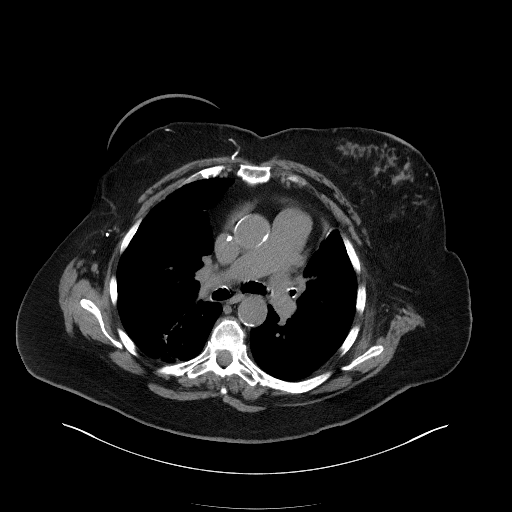
[im 119/141  lung]
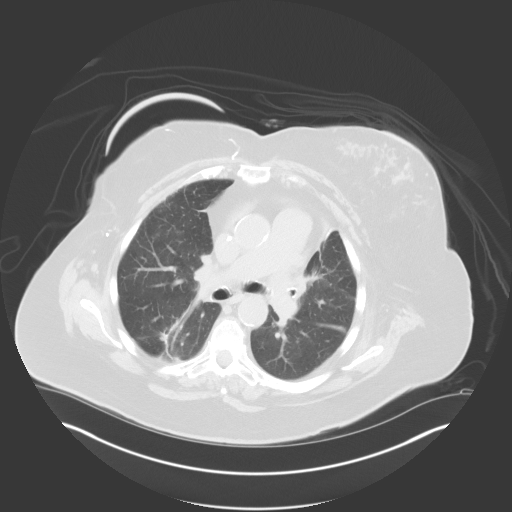
[im 130/141  lung]
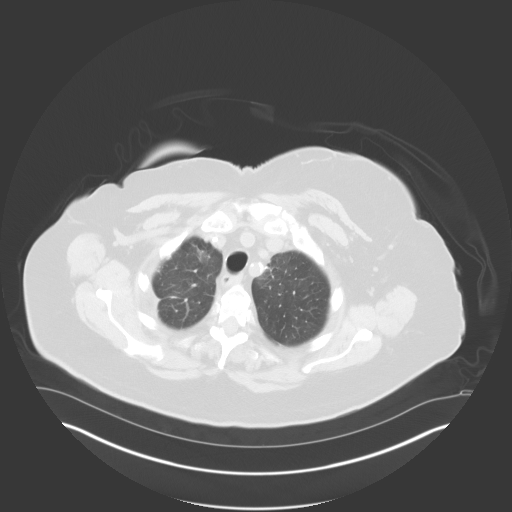

[Series 4: coronals · coronal · 0.88mm/px · 3 of 165 slices shown]
[im 33/165  lung]
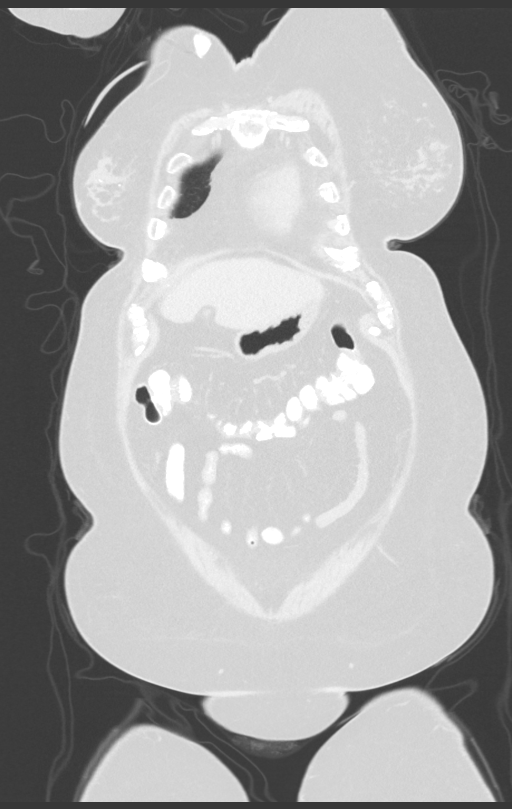
[im 66/165  lung]
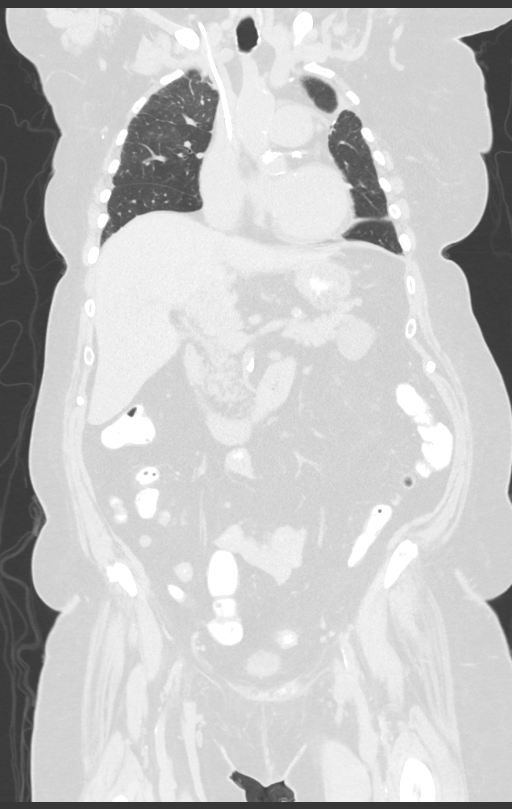
[im 99/165  lung]
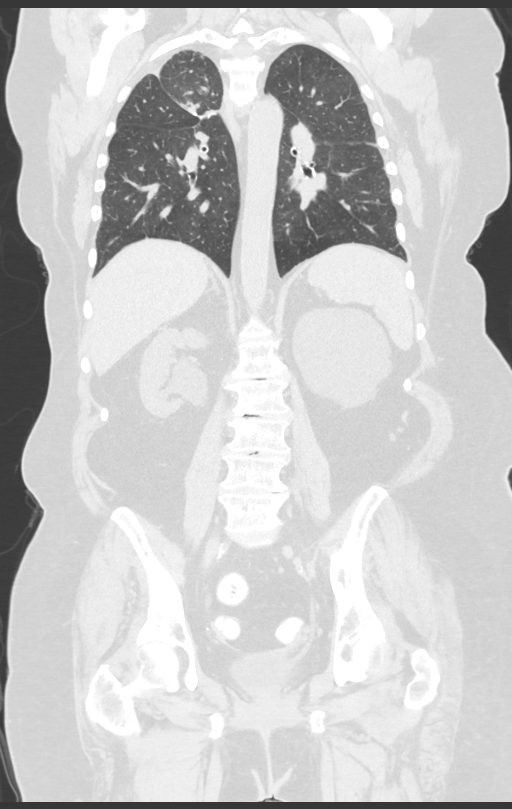

[13 of 36 positions shown; findings below may reference images not displayed]

FINDINGS: CT CHEST FINDINGS

Cardiovascular: Right IJ Port-A-Cath terminates in the right atrium.
Atherosclerotic calcification of the aorta, aortic valve and
coronary arteries. Heart is enlarged. No pericardial effusion.

Mediastinum/Nodes: No pathologically enlarged mediastinal or
axillary lymph nodes. Hilar regions are difficult to definitively
evaluate without IV contrast but appear grossly unremarkable. No
internal mammary adenopathy. Esophagus is grossly unremarkable.

Lungs/Pleura: Biapical pleuroparenchymal scarring. Postoperative
pleuroparenchymal scarring in the right upper lobe. Additional
scarring in the superior segment right lower lobe. Subsolid
subpleural nodule in the posterior right lower lobe overall measures
1.5 x 2.3 cm with an internal nodular component, 12 mm (6/94),
unchanged from 02/02/2021. When compared with more remote imaging,
lesion has increased in size from 06/28/2019, overall 1.2 x 1.4 cm
and internal nodular component 7 mm on that study. Postoperative
scarring in the left upper lobe. Subpleural scarring in the left
lower lobe. No pleural fluid. Airway is otherwise unremarkable.

Musculoskeletal: Degenerative changes in the spine. No worrisome
lytic or sclerotic lesions.

CT ABDOMEN PELVIS FINDINGS

Hepatobiliary: Liver is unremarkable. Stone in the gallbladder. No
biliary ductal dilatation.

Pancreas: Negative.

Spleen: Negative.

Adrenals/Urinary Tract: Adrenal glands are unremarkable. 12 mm stone
in a prominent right extrarenal pelvis. Subcentimeter hyperdense
lesion in the interpolar right kidney, too small to characterize.
Low-attenuation lesions in left kidney measure up to 8.7 cm,
indicative of cysts. Left renal stone. Ureters are decompressed.
Bladder is somewhat low in volume.

Stomach/Bowel: Stomach, small bowel and colon are unremarkable.
Difficult to exclude rectal wall thickening. This area is under
distended. Appendix is not visualized.

Vascular/Lymphatic: Atherosclerotic calcification of the aorta. No
pathologically enlarged lymph nodes.

Reproductive: Hysterectomy.  No adnexal mass.

Other: No free fluid.  Mesenteries and peritoneum are unremarkable.

Musculoskeletal: Degenerative changes in the spine. No worrisome
lytic or sclerotic lesions.
IMPRESSION: 1. Subsolid nodule in the posterior right lower lobe, stable from
02/02/2021 but slightly increased in size from 06/28/2019. Finding
is worrisome for indolent adenocarcinoma.
2. Otherwise, no evidence of metastatic disease.
3. Cholelithiasis.
4. Bilateral renal stones including a stone in the right renal
pelvis. No hydronephrosis.
5. Difficult to exclude rectal wall thickening. This area is under
distended.
6. Aortic atherosclerosis ([id number]). Coronary artery
calcification.

## 2023-05-31 IMAGING — US US ABDOMEN LIMITED
1 series · 15 of 25 positions shown · non-contrast
Comparison: CT from 08/04/2021

CLINICAL DATA: History of jaundice, known metastatic colon
carcinoma

EXAM:
ULTRASOUND ABDOMEN LIMITED RIGHT UPPER QUADRANT

[Series 1: us abdomen limited ruq mc & wl · 15 of 47 slices shown]
[im 1/47]
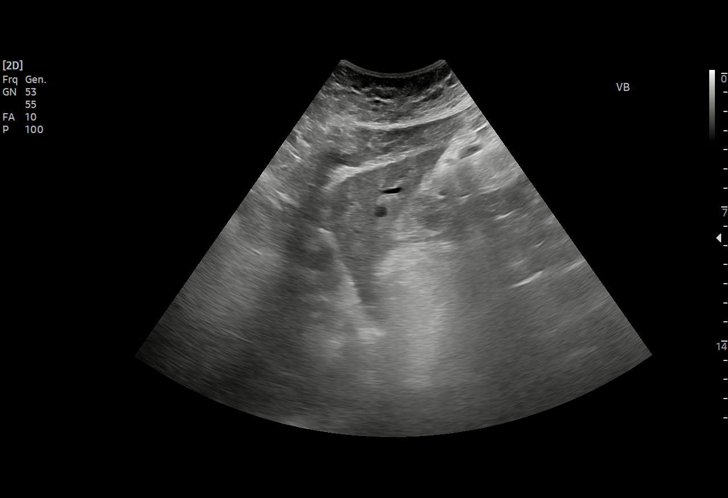
[im 4/47]
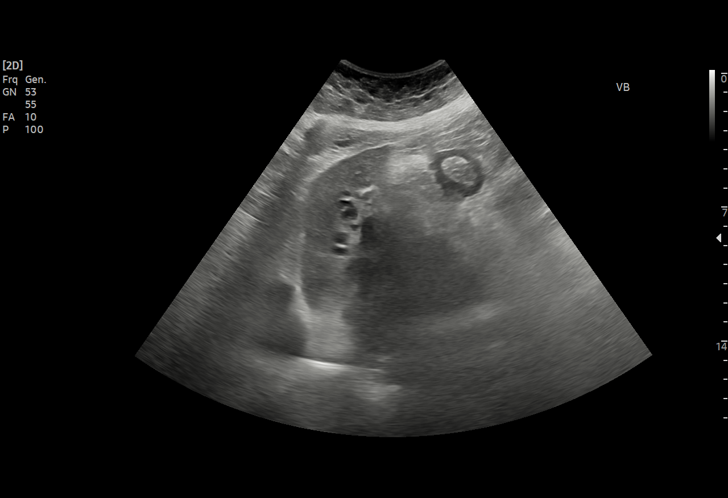
[im 8/47]
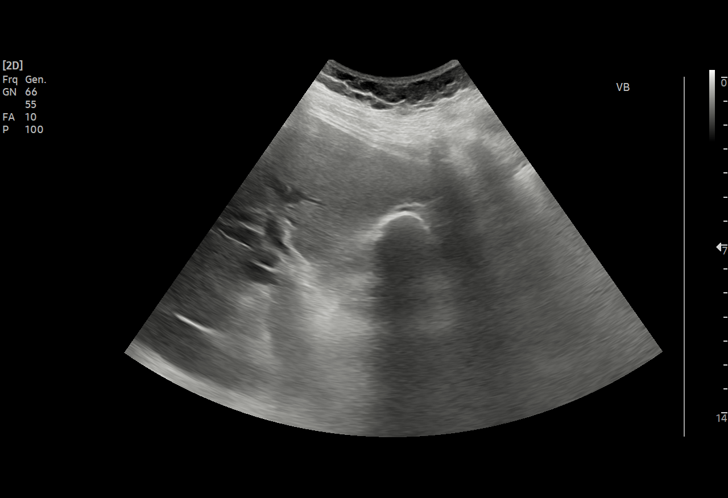
[im 10/47]
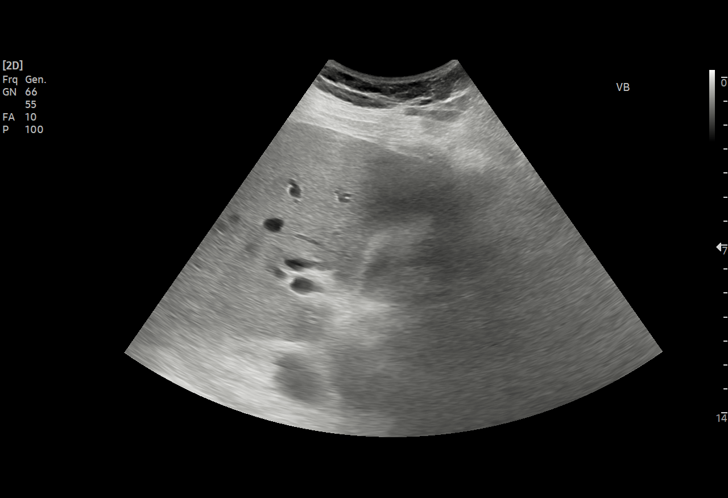
[im 14/47]
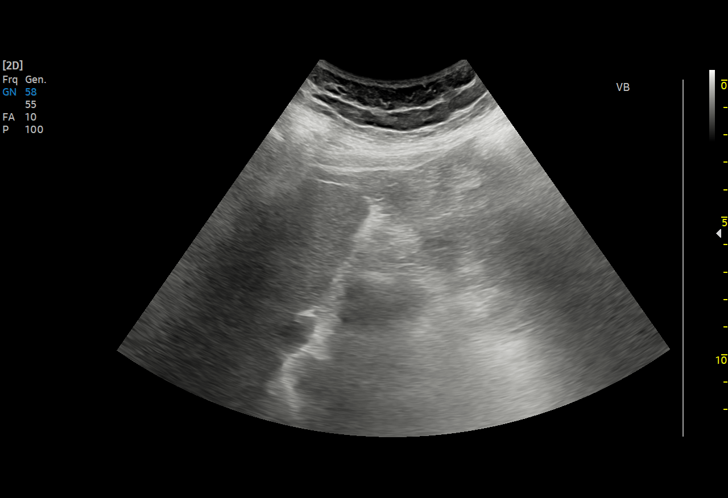
[im 18/47]
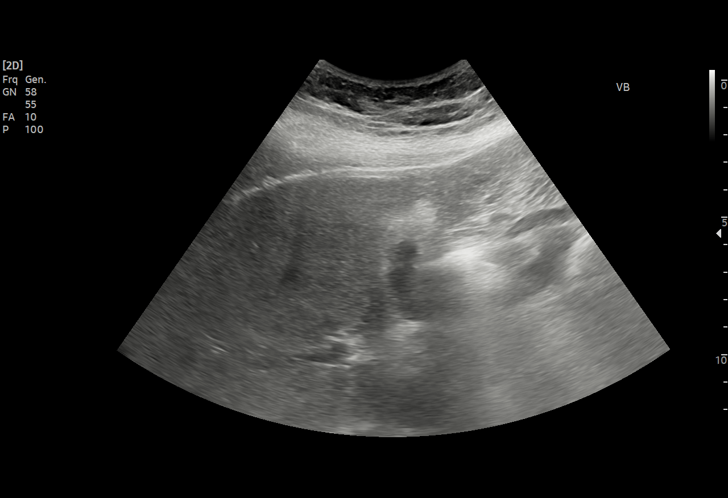
[im 20/47]
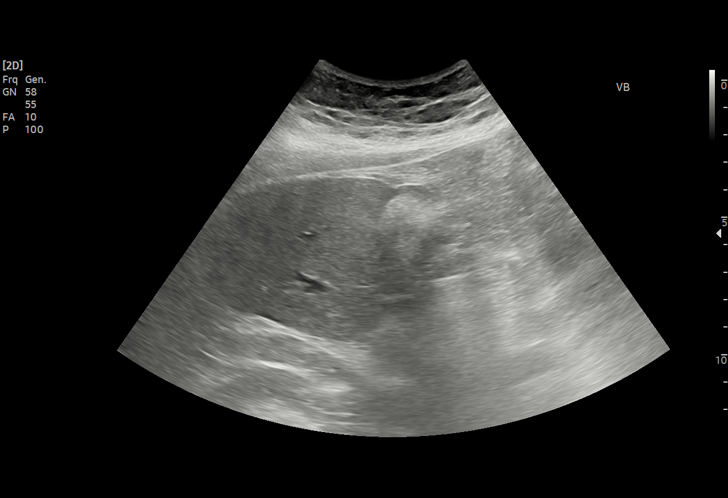
[im 24/47]
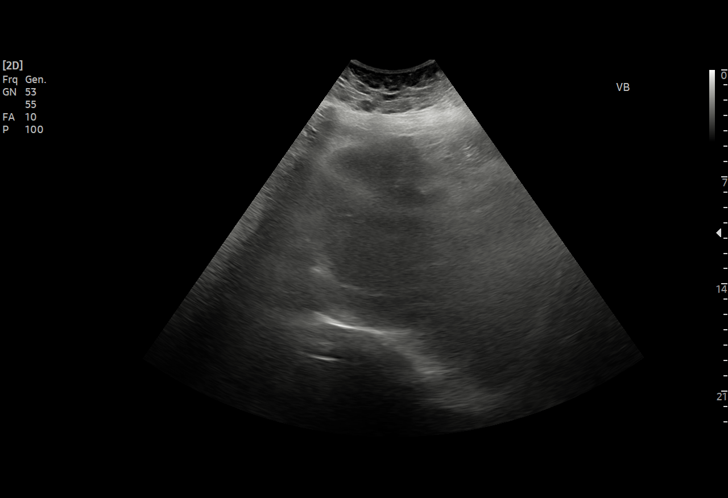
[im 27/47]
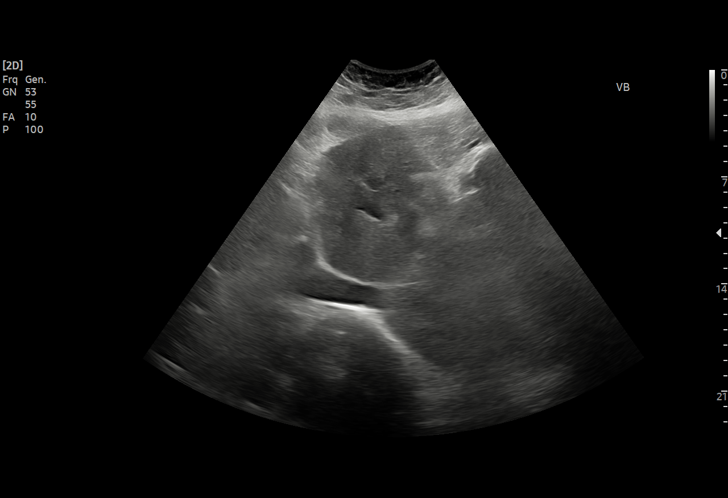
[im 29/47]
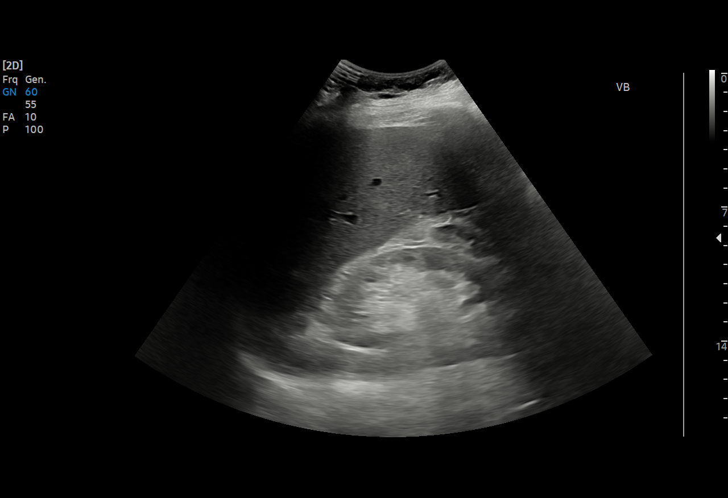
[im 33/47]
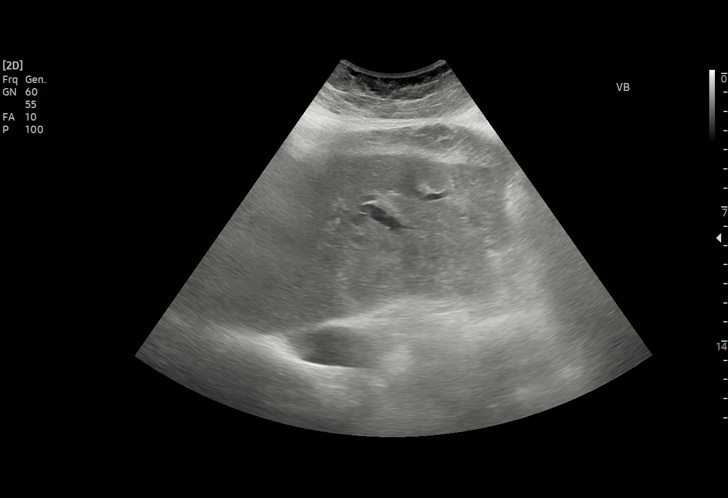
[im 37/47]
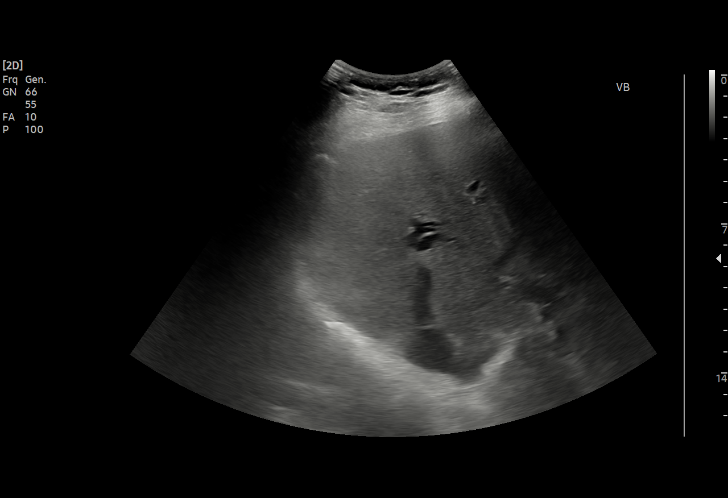
[im 39/47]
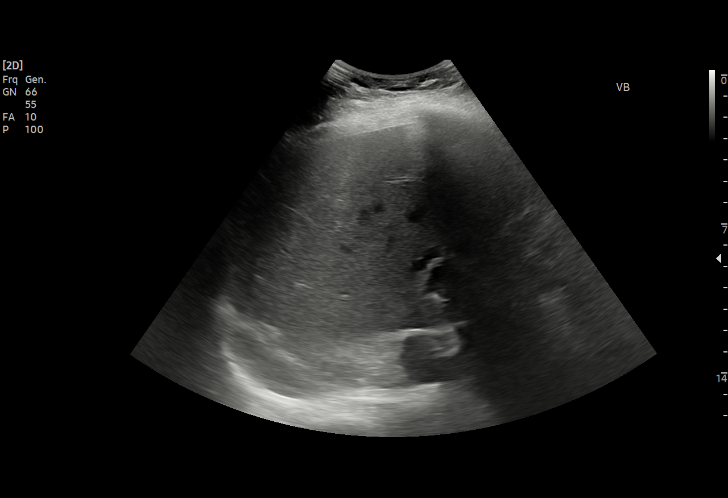
[im 43/47]
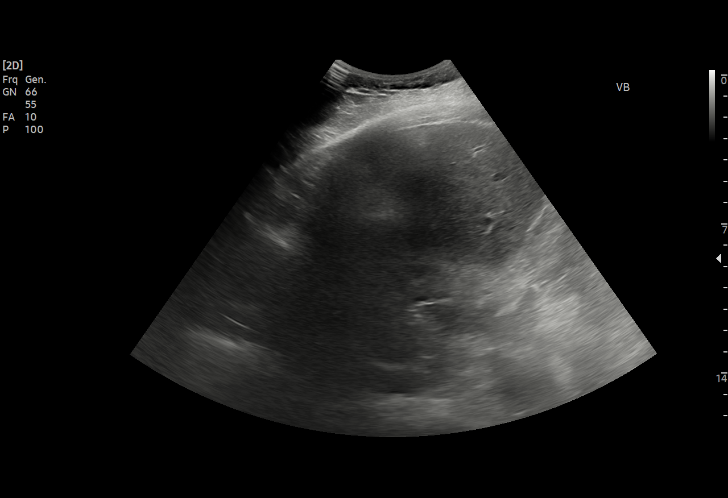
[im 47/47]
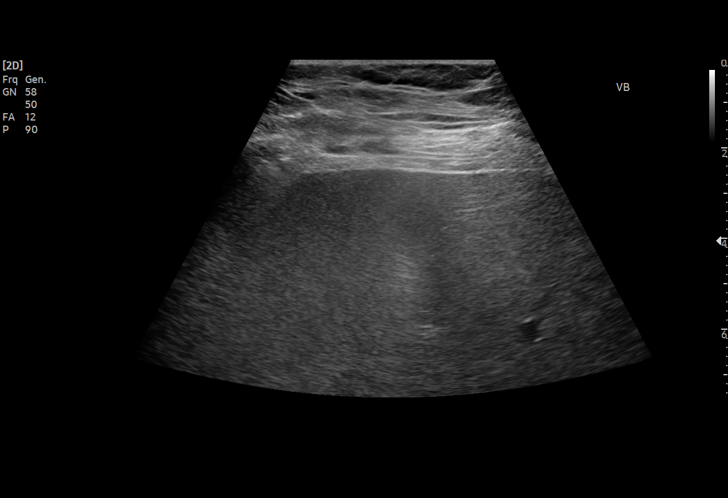

[15 of 25 positions shown; findings below may reference images not displayed]

FINDINGS: Gallbladder:

Cholelithiasis is identified without wall thickening or
pericholecystic fluid. The overall appearance is similar seen on
prior CT examination.

Common bile duct:

Diameter: 11.3 mm.

Liver:

Increased in echogenicity without focal mass. Portal vein is patent
on color Doppler imaging with normal direction of blood flow towards
the liver. Biliary ductal dilatation is noted.

Other: None.
IMPRESSION: Cholelithiasis without complicating factors.

Prominent common bile duct and intrahepatic dilatation without
definitive stone. This is suggestive of distal common bile duct
stone and will be better evaluated on upcoming CT examination.

Fatty liver

## 2023-05-31 IMAGING — MR MR ABDOMEN WO/W CM MRCP
17 of 22 series · 39 of 48 positions shown · IV contrast (gadavist)
Comparison: Abdomen/pelvis CT 11/12/2021. Abdomen pelvis CT
08/04/2021.

CLINICAL DATA: Obstructive jaundice. History of breast cancer, lung
cancer, and colon cancer with mets to.

EXAM:
MRI ABDOMEN WITHOUT AND WITH CONTRAST (INCLUDING MRCP)
TECHNIQUE: Multiplanar multisequence MR imaging of the abdomen was performed
both before and after the administration of intravenous contrast.
Heavily T2-weighted images of the biliary and pancreatic ducts were
obtained, and three-dimensional MRCP images were rendered by post
processing.
CONTRAST:  10mL GADAVIST GADOBUTROL 1 MMOL/ML IV SOLN

[Series 3: T2 · coronal · 6.0mm · 1.52mm/px · 1 of 35 slices shown (1 of 2)]
[im 1/35]
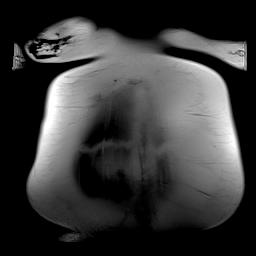

[Series 4: T2 · axial · 6.0mm · 1.64mm/px · 1 of 35 slices shown (2 of 2)]
[im 1/35]
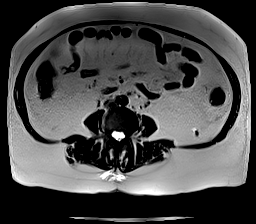

[Series 5: T2 fat-sat · axial · 6.0mm · 1.25mm/px · 1 of 36 slices shown]
[im 1/36]
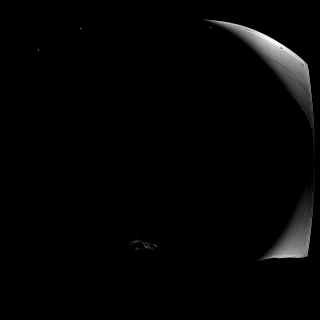

[Series 8: cor obl thk · sagittal · 50.0mm · 0.78mm/px · 1 of 9 slices shown]
[im 1/9]
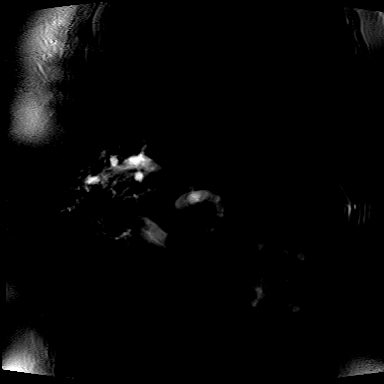

[Series 9: T1 · axial · 3.0mm · 1.25mm/px · z∈[-216,+21]mm · 3 of 80 slices shown (1 of 2)]
[im 1/80]
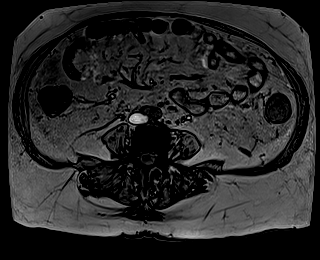
[im 40/80]
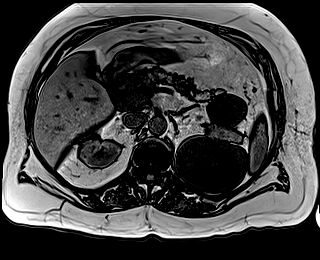
[im 80/80]
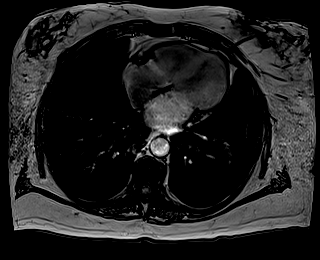

[Series 10: T1 · axial · 3.0mm · 1.25mm/px · z∈[-216,+21]mm · 3 of 80 slices shown (2 of 2)]
[im 1/80]
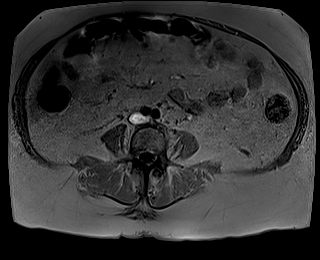
[im 40/80]
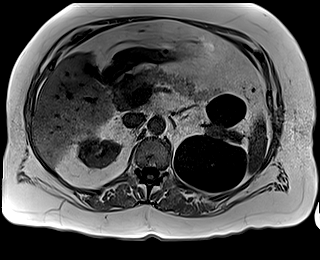
[im 80/80]
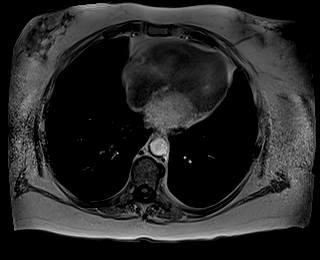

[Series 11: DWI · axial · 6.0mm · 1.49mm/px · z∈[-210,+42]mm · 2 of 72 slices shown (1 of 2)]
[im 1/72]
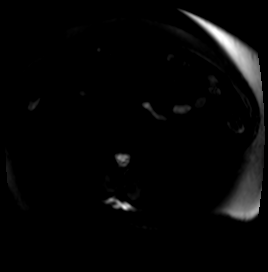
[im 72/72]
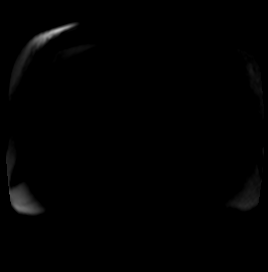

[Series 12: DWI · axial · 6.0mm · 1.49mm/px · 1 of 36 slices shown (2 of 2)]
[im 1/36]
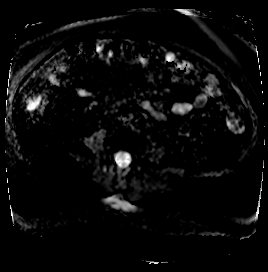

[Series 14: T1 dynamic · axial · 3.0mm · 1.25mm/px · z∈[-215,+46]mm · 3 of 88 slices shown (1 of 8)]
[im 1/88]
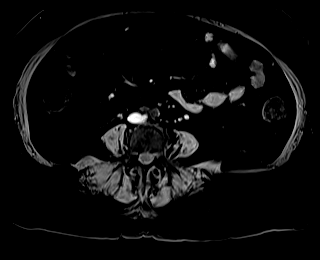
[im 44/88]
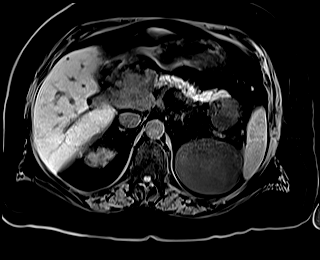
[im 88/88]
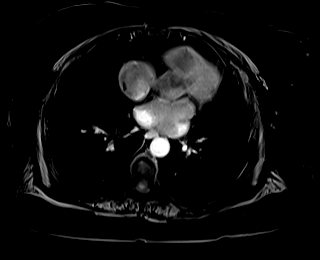

[Series 16: cor_3d_spc_trig · coronal · 1.0mm · 0.49mm/px · 2 of 72 slices shown]
[im 1/72]
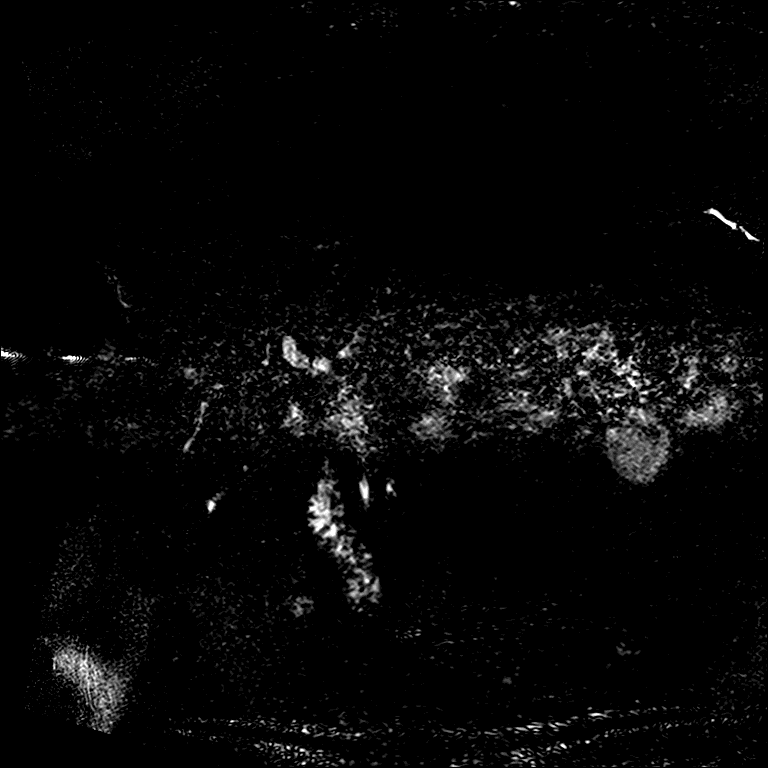
[im 72/72]
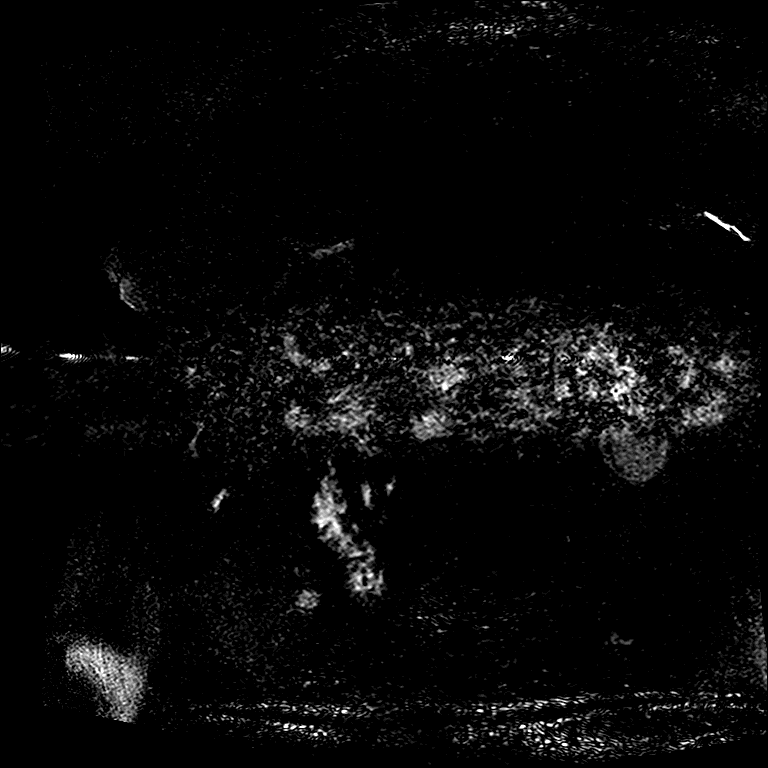

[Series 21: T1 dynamic · axial · 3.0mm · 1.25mm/px · z∈[-215,+46]mm · 3 of 88 slices shown (2 of 8)]
[im 1/88]
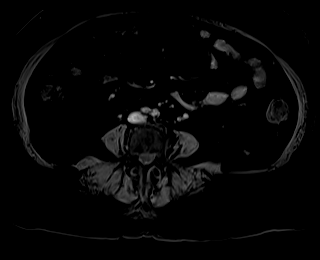
[im 44/88]
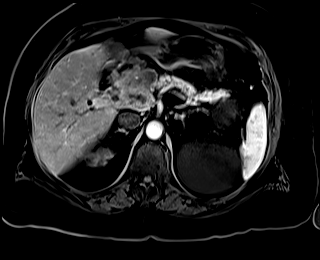
[im 88/88]
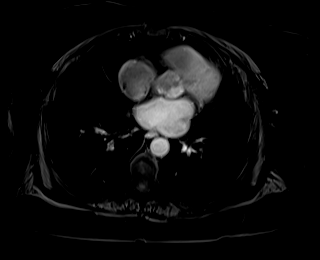

[Series 22: T1 dynamic · axial · 3.0mm · 1.25mm/px · z∈[-215,+46]mm · 3 of 88 slices shown (3 of 8)]
[im 1/88]
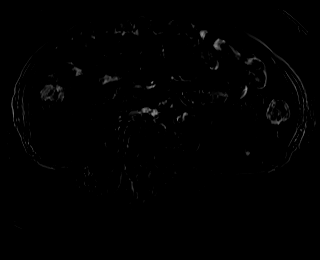
[im 44/88]
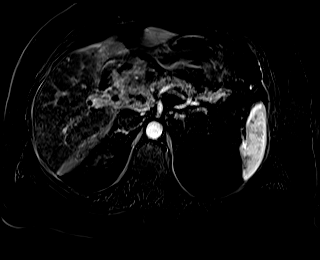
[im 88/88]
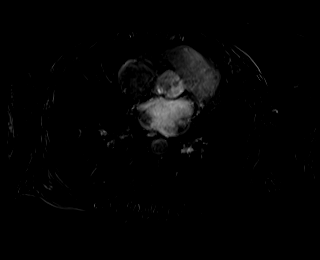

[Series 25: T1 dynamic · axial · 3.0mm · 1.25mm/px · z∈[-215,+46]mm · 3 of 88 slices shown (4 of 8)]
[im 1/88]
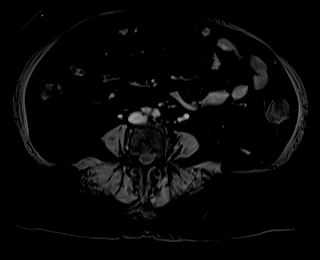
[im 44/88]
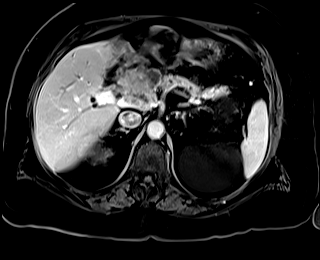
[im 88/88]
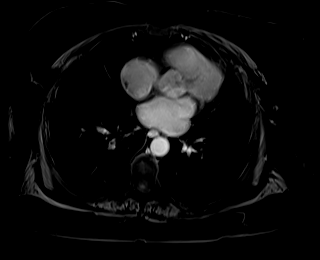

[Series 26: T1 dynamic · axial · 3.0mm · 1.25mm/px · z∈[-215,+46]mm · 3 of 88 slices shown (5 of 8)]
[im 1/88]
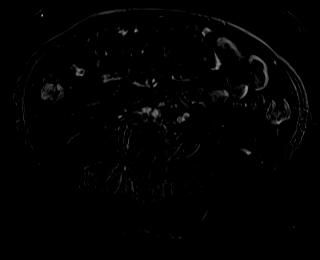
[im 44/88]
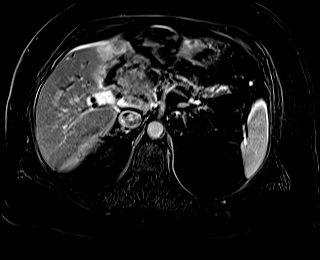
[im 88/88]
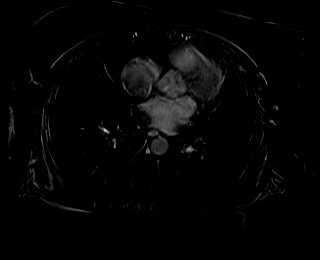

[Series 29: T1 dynamic · axial · 3.0mm · 1.25mm/px · z∈[-215,+46]mm · 3 of 88 slices shown (6 of 8)]
[im 1/88]
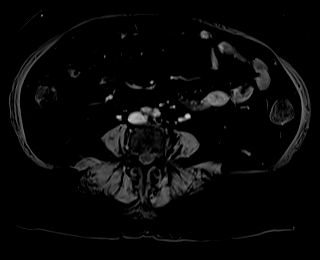
[im 44/88]
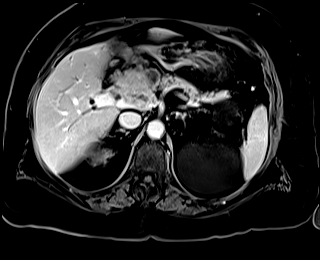
[im 88/88]
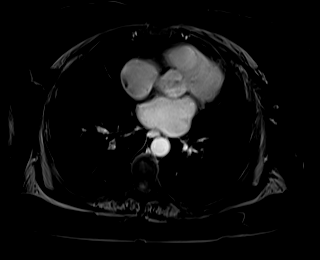

[Series 30: T1 dynamic · axial · 3.0mm · 1.25mm/px · z∈[-215,+46]mm · 3 of 88 slices shown (7 of 8)]
[im 1/88]
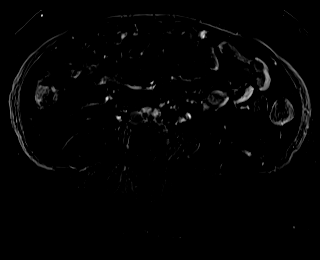
[im 44/88]
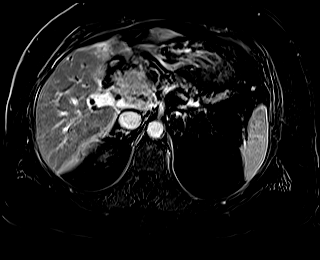
[im 88/88]
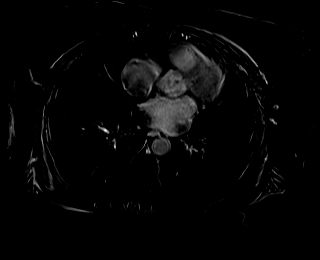

[Series 32: T1 dynamic · coronal · 3.0mm · 1.41mm/px · 3 of 80 slices shown (8 of 8)]
[im 1/80]
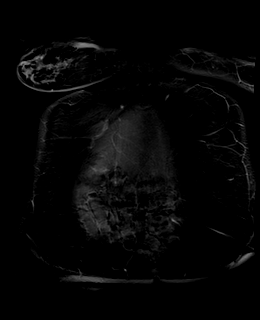
[im 40/80]
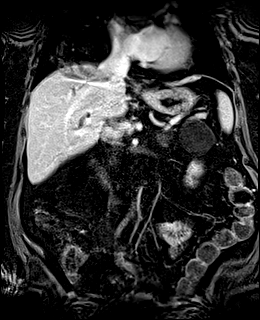
[im 80/80]
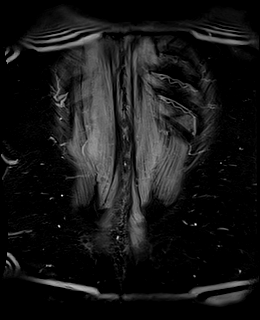

[39 of 48 positions shown; findings below may reference images not displayed]

FINDINGS: Lower chest: Unremarkable.

Hepatobiliary: 6.2 x 5.2 x 5.7 cm heterogeneously enhancing lesion
is identified in the central porta hepatis invading the medial liver
and caudate lobe. Lesion restricts diffusion and is well visualized
on diffusion-weighted imaging (see image 50 of series 11). This mass
has been progressive over the last 2 CT scans although it was
extremely subtle on those studies performed without intravenous
contrast material, mimicking background liver attenuation. On
today's exam, this lesion obstructs the left and right hepatic ducts
at the confluence. Lesion generates substantial mass-effect on and
may actually invade the main portal vein although the portal vein
does appear to remain patent. No evidence for filling defect in the
hepatic venous anatomy.

Intrahepatic biliary duct dilatation is seen in the left and right
hepatic lobes with obliteration of the common duct in the
hepatoduodenal ligament. Common bile duct reconstitutes distal to
the mass and is normal diameter leading into the ampulla.
Gallbladder is contracted around a 2.5 cm gallstone.

Pancreas: Above described mass in the porta hepatis does not appear
to arise from the pancreas by MR imaging. No dilatation of the main
duct. No intraparenchymal cyst. No peripancreatic edema.

Spleen:  No splenomegaly. No focal mass lesion.

Adrenals/Urinary Tract: No adrenal nodule or mass. Multiple
bilateral renal cysts including dominant exophytic 9.2 cm cyst upper
pole left kidney.

Stomach/Bowel: Stomach is unremarkable. No gastric wall thickening.
No evidence of outlet obstruction. No small bowel or colonic
dilatation within the visualized abdomen.

Vascular/Lymphatic: No abdominal aortic aneurysm. Although difficult
to assess given motion artifact, the mass lesion in the porta
hepatis abuts the common hepatic artery and appears to encase the
left hepatic artery. Right hepatic artery may be replaced arising
from the SMA, although this is not well visualized. See above for
discussion of portal venous anatomy. Superior mesenteric vein and
splenic vein are patent.No retroperitoneal lymphadenopathy.

Other:  No substantial intraperitoneal free fluid.

Musculoskeletal: No focal suspicious marrow enhancement within the
visualized bony anatomy.
IMPRESSION: 1. 6.2 x 5.2 x 5.7 cm heterogeneously enhancing lesion in the porta
hepatis invading the medial liver and caudate lobe. This mass
obstructs the left and right hepatic ducts near the confluence with
intrahepatic biliary duct dilatation. Lesion generates substantial
mass-effect on and may actually invade the main portal vein although
the portal vein does appear to remain patent (assessment markedly
limited due to motion artifact). MR imaging suggests that this is
not a primary pancreatic neoplasm. Metastatic disease would be a
distinct consideration. Peripheral cholangiocarcinoma and
hepatocellular carcinoma are not excluded but felt to be less likely
based on enhancement characteristics.
2. Cholelithiasis.
3. Bilateral renal cysts.

## 2023-05-31 IMAGING — CT CT ABD-PELV W/O CM
2 of 4 series · 15 of 46 positions shown, 17 images · non-contrast
Comparison: CT abdomen and pelvis 08/04/2021.

CLINICAL DATA: Abdominal pain, biliary obstruction suspected. New
onset jaundice.

EXAM:
CT ABDOMEN AND PELVIS WITHOUT CONTRAST
TECHNIQUE: Multidetector CT imaging of the abdomen and pelvis was performed
following the standard protocol without IV contrast.

[Series 2: axial st · axial · 0.89mm/px · z∈[-519,-64]mm · 12 of 106 slices shown, 14 images]
[im 8/106  soft-tissue]
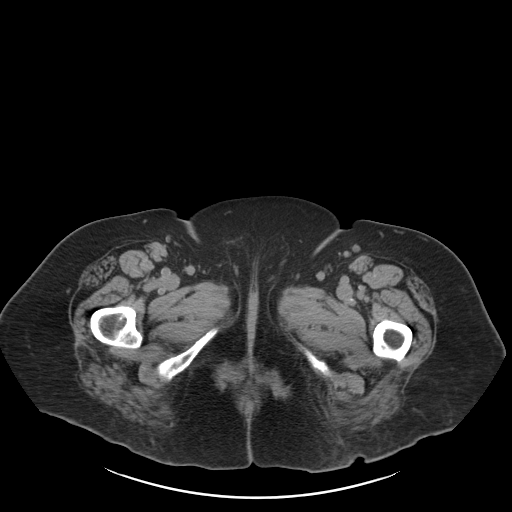
[im 8/106  bone]
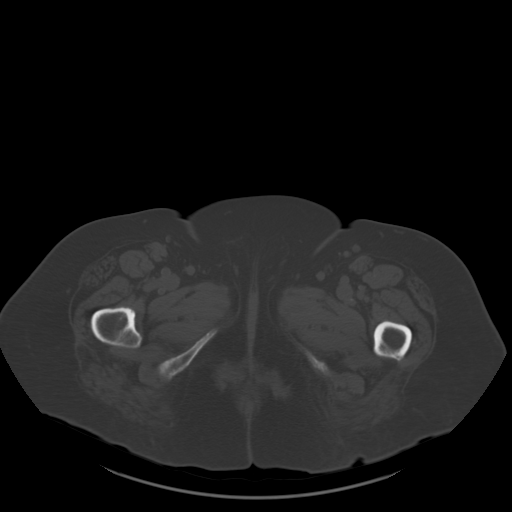
[im 15/106  soft-tissue]
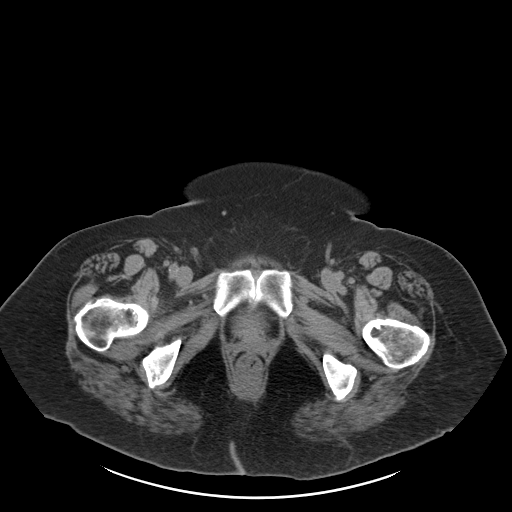
[im 22/106  soft-tissue]
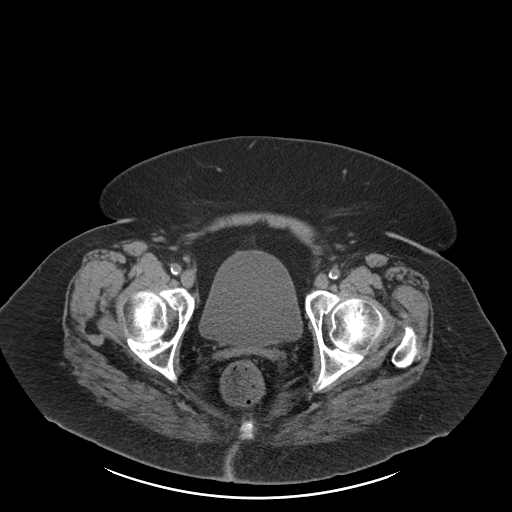
[im 36/106  soft-tissue]
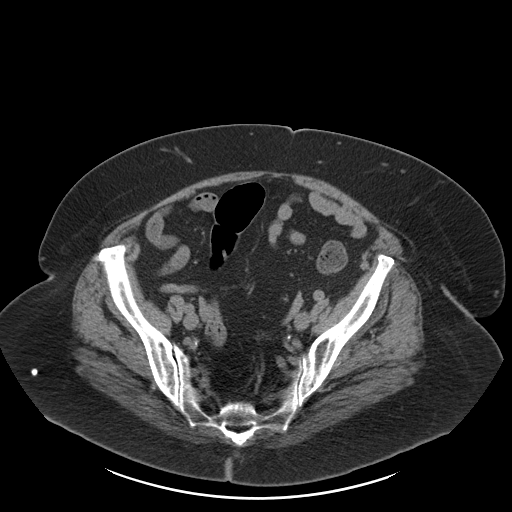
[im 43/106  soft-tissue]
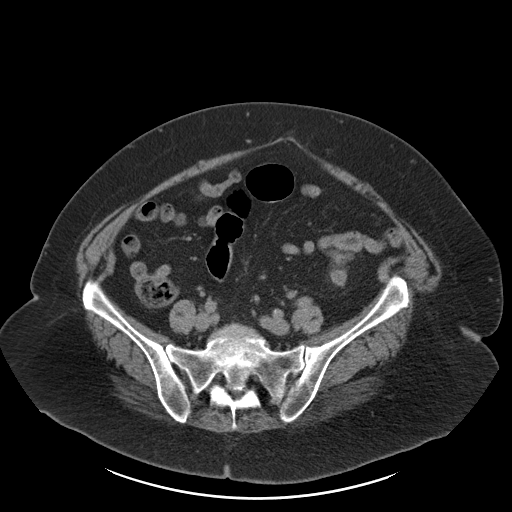
[im 50/106  soft-tissue]
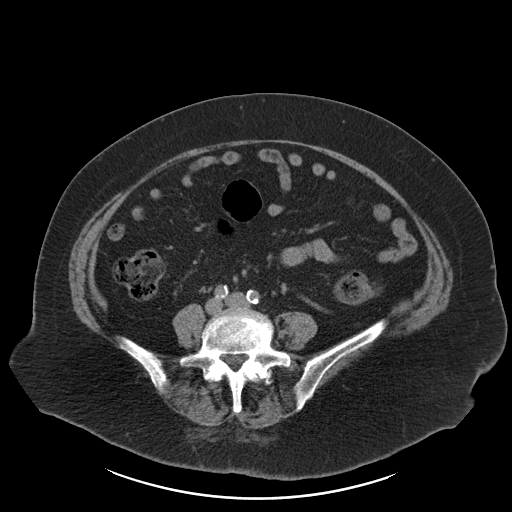
[im 57/106  soft-tissue]
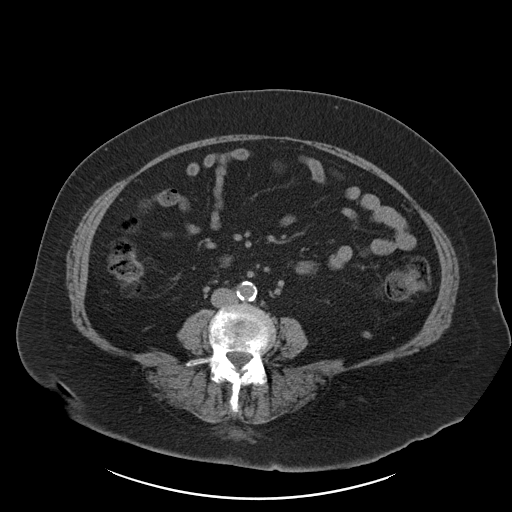
[im 64/106  soft-tissue]
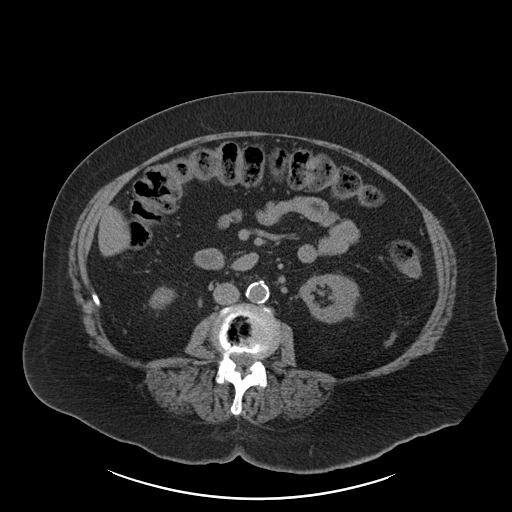
[im 71/106  soft-tissue]
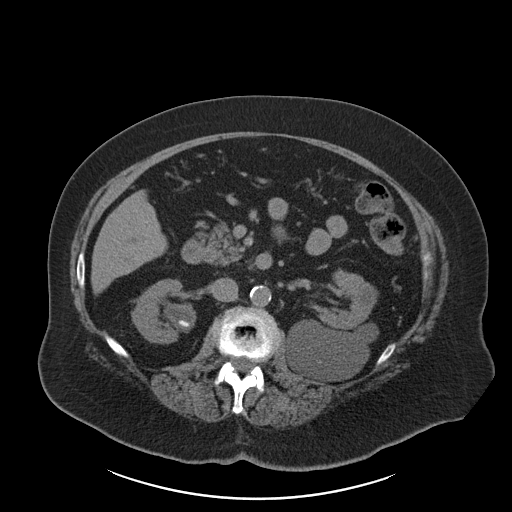
[im 71/106  bone]
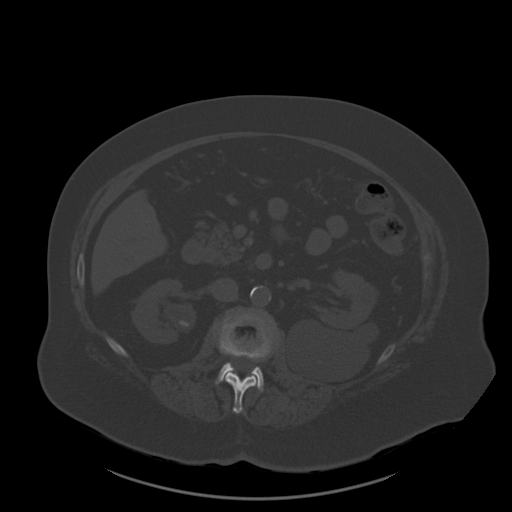
[im 85/106  soft-tissue]
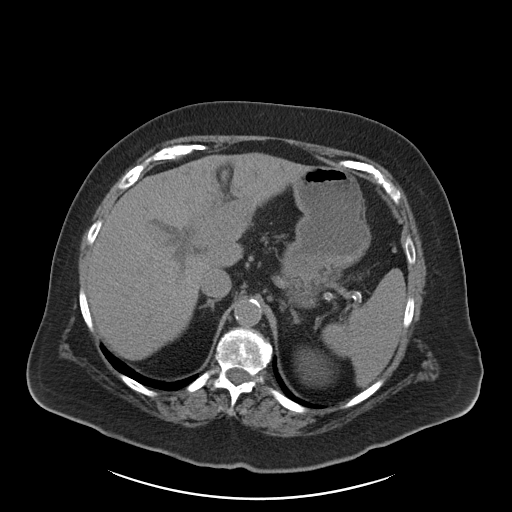
[im 92/106  soft-tissue]
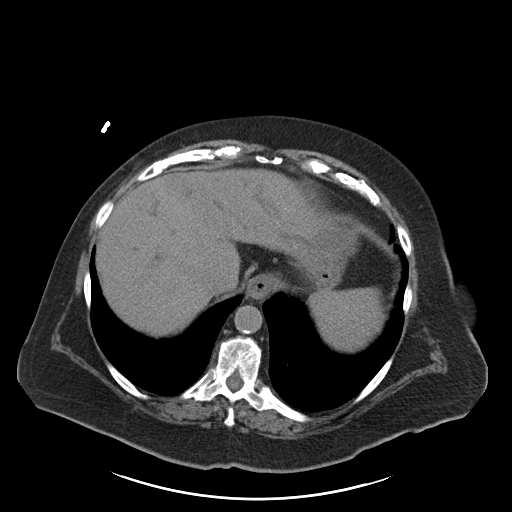
[im 99/106  soft-tissue]
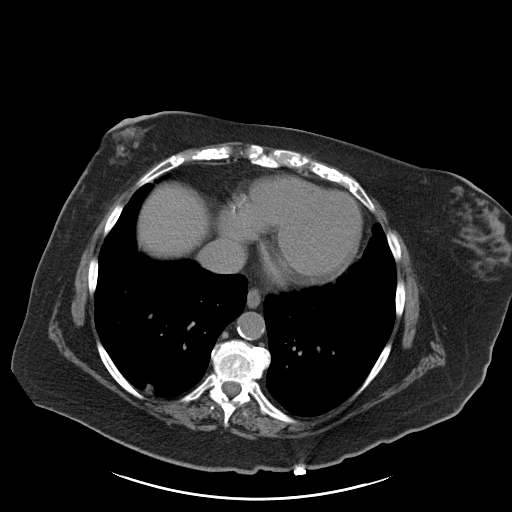

[Series 4: coronal st · coronal · 0.93mm/px · 3 of 167 slices shown]
[im 56/167  soft-tissue]
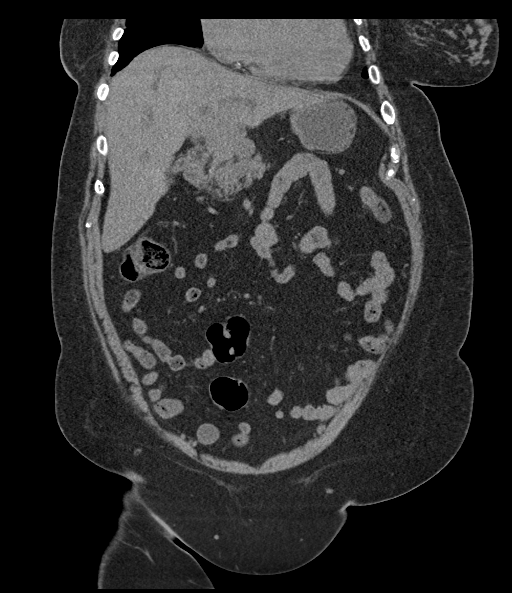
[im 74/167  soft-tissue]
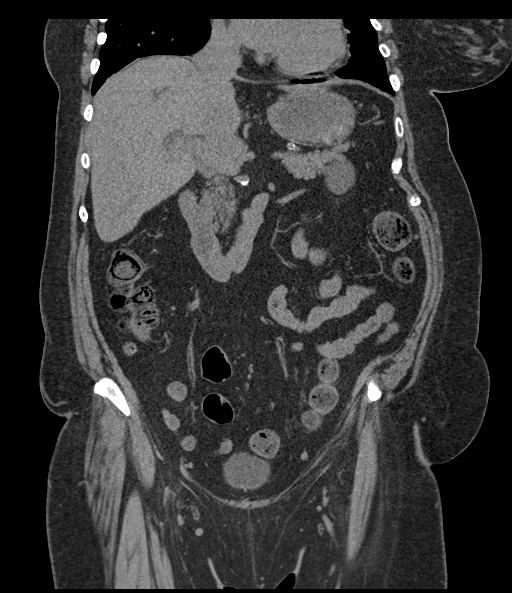
[im 93/167  soft-tissue]
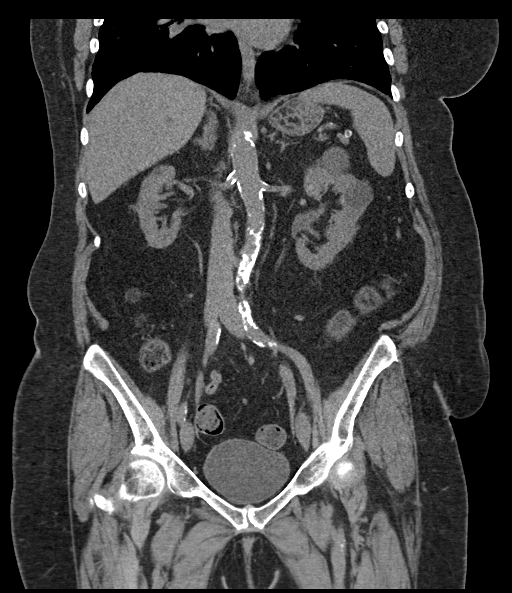

[15 of 46 positions shown; findings below may reference images not displayed]

FINDINGS: Lower chest: Subsolid right lower lobe pulmonary nodule appears
unchanged with solid component measuring 9 mm. Lung bases are
otherwise clear.

Hepatobiliary: Gallstones are again seen. There is no
pericholecystic inflammation. There is new mild to moderate
intrahepatic biliary ductal dilatation. Common bile duct not well
seen.

Pancreas: There is soft tissue fullness in the periportal region
measuring 4.7 x 2.3 cm image [DATE]. This is abutting the superior
margin of the pancreatic head. The pancreas otherwise appears within
normal limits. There is no evidence for pancreatic ductal dilatation
or surrounding inflammation.

Spleen: Normal in size without focal abnormality.

Adrenals/Urinary Tract: The bilateral adrenal glands are within
normal limits.

There is a stable 1 cm calculus in the right renal pelvis. There is
also stable 5 mm calculus in the lower pole the left kidney. No
ureteral calculi are seen. There is no hydronephrosis. Left renal
cysts are again noted. The largest cyst measures 8.5 cm posteriorly
similar to the prior examination. Small hyperdense lesion in the
right kidney is incompletely characterized and may represent a
proteinaceous cyst. This is unchanged from prior examination.

The bladder is within normal limits.

Stomach/Bowel: Stomach is within normal limits. Appendix appears
normal. No evidence of bowel wall thickening, distention, or
inflammatory changes.

Vascular/Lymphatic: Aortic atherosclerosis. No enlarged abdominal or
pelvic lymph nodes.

Reproductive: Status post hysterectomy. No adnexal masses.

Other: There is no ascites or free air. There is no focal abdominal
wall hernia.

Musculoskeletal: Multilevel degenerative changes affect the spine.
No focal osseous lesions are identified.
IMPRESSION: 1. Ill-defined soft tissue fullness in the right periportal region
superior to the pancreatic head. There is mild to moderate
intrahepatic biliary ductal dilatation. Findings are worrisome for
primary pancreatic neoplasm or cholangiocarcinoma. Further
evaluation with MRI recommended.

2.  Cholelithiasis.  No additional evidence for cholecystitis.

3.   Stable left renal calculus and right renal pelvis calculus.

4. Subsolid nodule in the right lower lobe is unchanged. Metastatic
disease or primary neoplasm not excluded.

5.  Aortic Atherosclerosis ([id number]).

## 2023-06-02 IMAGING — RF DG ERCP WO/W SPHINCTEROTOMY
1 series · 4 of 4 positions shown · non-contrast
Comparison: None.

CLINICAL DATA: ERCP

EXAM:
ERCP
TECHNIQUE: Multiple spot images obtained with the fluoroscopic device and
submitted for interpretation post-procedure.
FLUOROSCOPY TIME:  Refer to procedure report.

[Series 1: run · 4 of 4 slices shown]
[im 1/4]
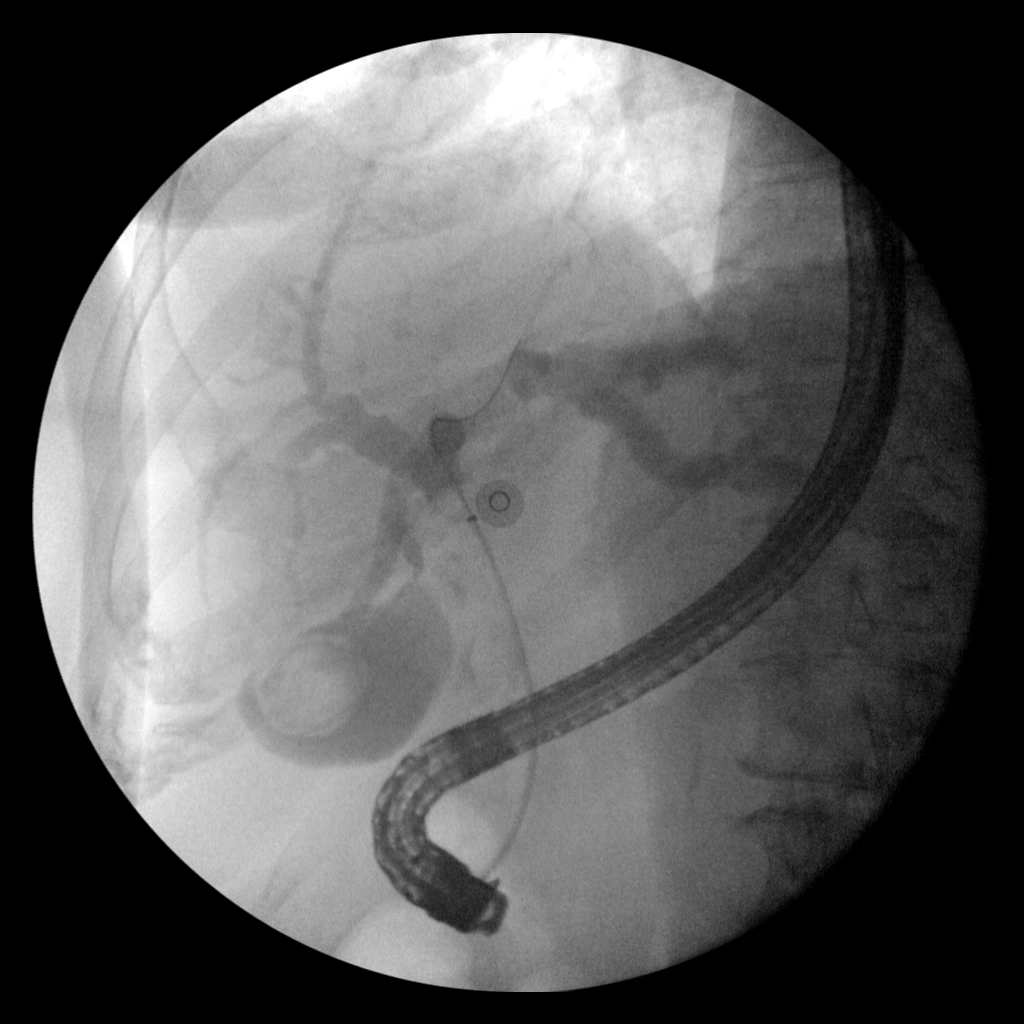
[im 2/4]
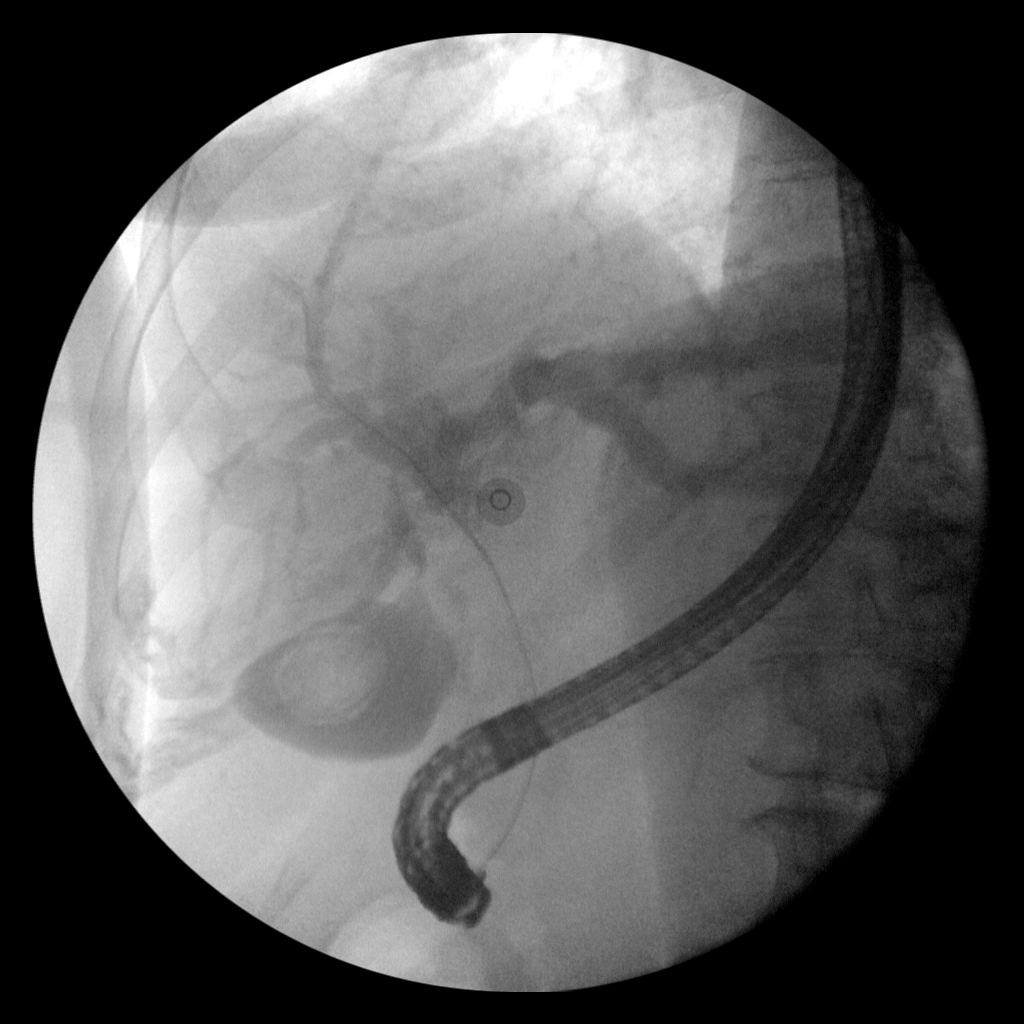
[im 3/4]
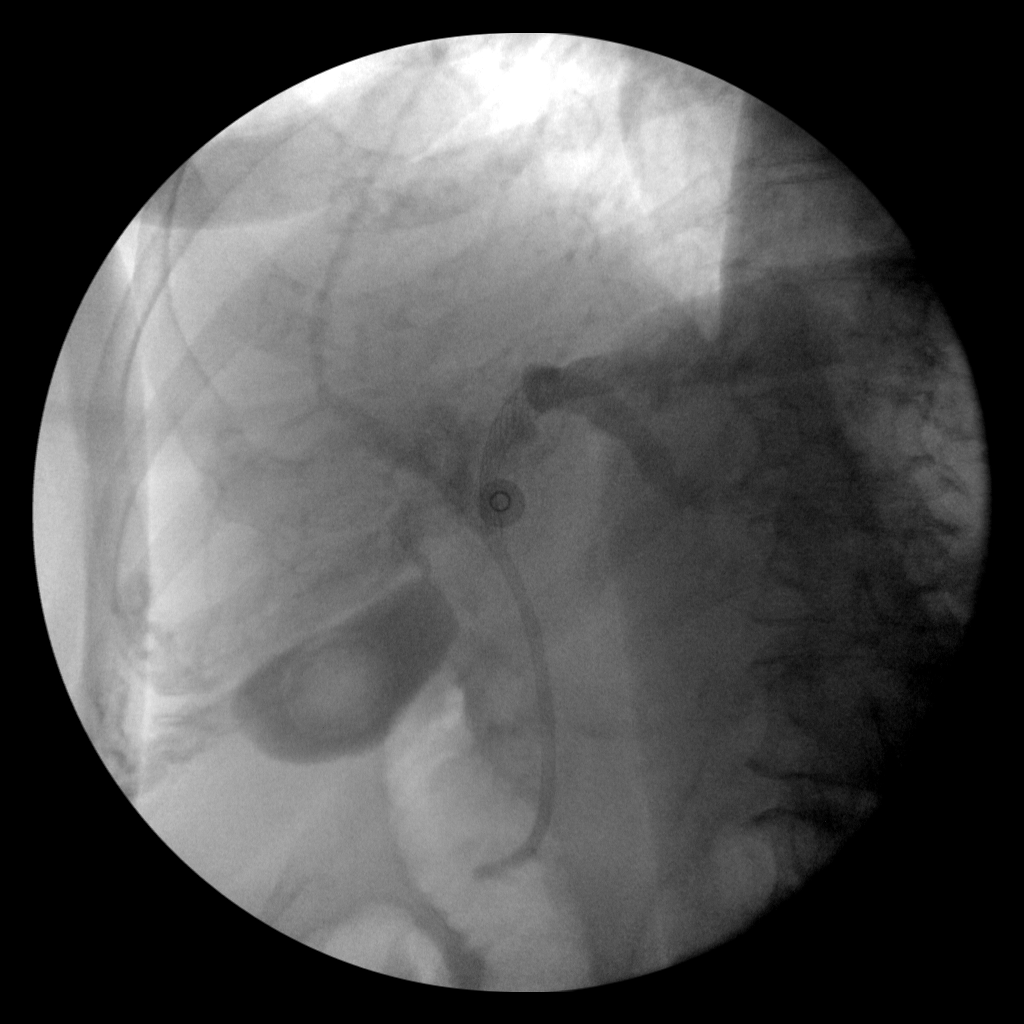
[im 4/4]
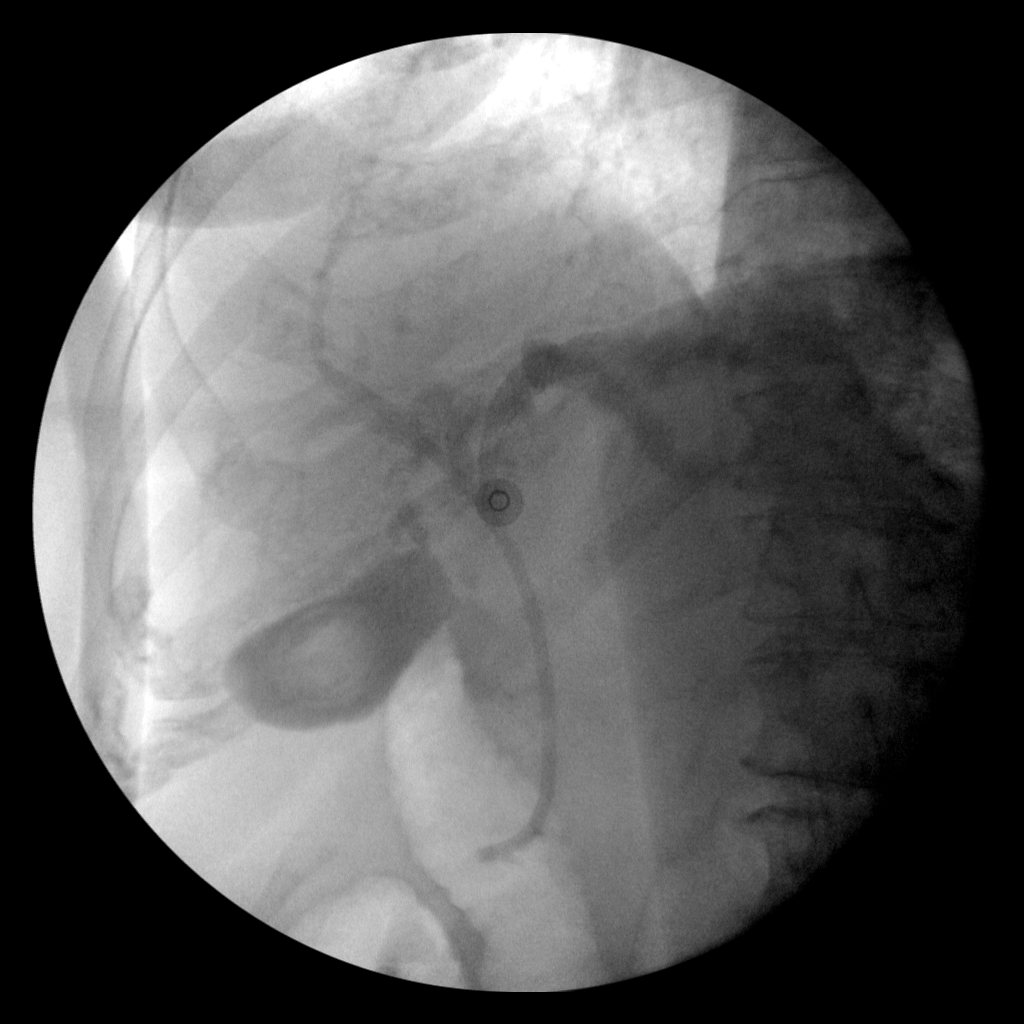

[4 of 4 positions shown; findings below may reference images not displayed]

FINDINGS: A total of 4 fluoroscopic spot films are submitted for review taken
during ERCP procedure. Images demonstrate scope overlying the mid
abdomen. The common bile duct is catheterized, and contrast
injection of the intrahepatic biliary tree demonstrate dilated
biliary ducts. There is a metallic stent which appears to be
deployed in the left hepatic duct. A plastic biliary stent appears
to be placed into the common bile duct.
IMPRESSION: Fluoroscopic images taken during ERCP as described. Refer to
procedure report for full details.

These images were submitted for radiologic interpretation only.
Please see the procedural report for the amount of contrast and the
fluoroscopy time utilized.

## 2023-07-14 IMAGING — CT CT ABD-PELV W/O CM
2 of 4 series · 14 of 46 positions shown, 16 images · non-contrast
Comparison: There are numerous prior CTs. The 2 most recent are
noncontrast CTs dated 11/12/2021 and 08/04/2021

CLINICAL DATA: Prior history of lung, colon and breast cancer,
presents today with abdominal pain and low back pain.

EXAM:
CT ABDOMEN AND PELVIS WITHOUT CONTRAST
TECHNIQUE: Multidetector CT imaging of the abdomen and pelvis was performed
following the standard protocol without IV contrast.

[Series 1: axial st · axial · 0.98mm/px · z∈[-533,-58]mm · 11 of 107 slices shown, 13 images]
[im 6/107  soft-tissue]
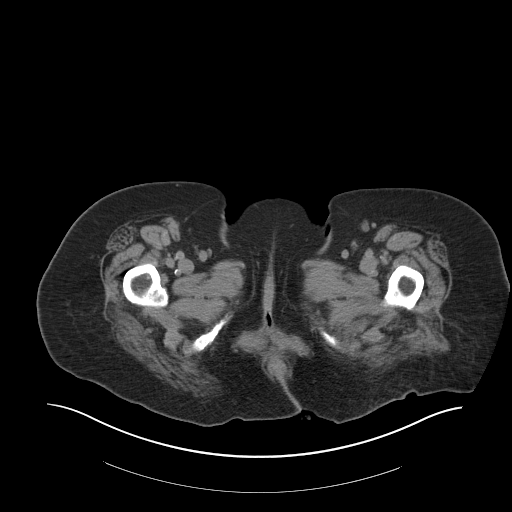
[im 6/107  bone]
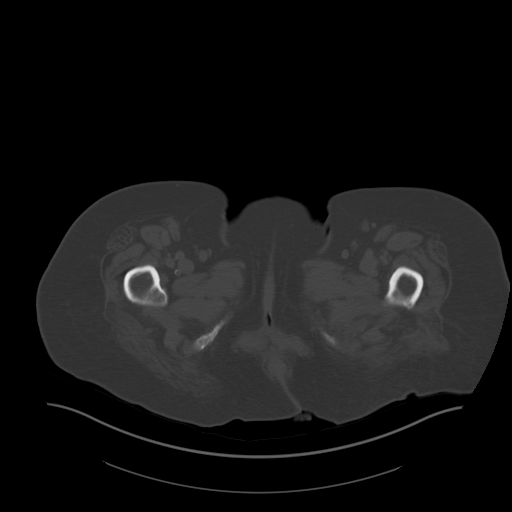
[im 18/107  soft-tissue]
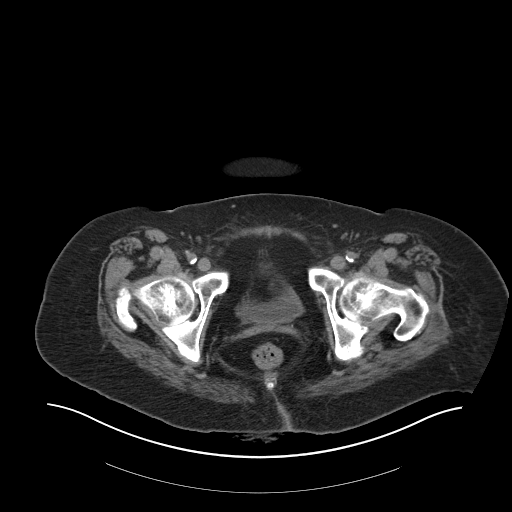
[im 24/107  soft-tissue]
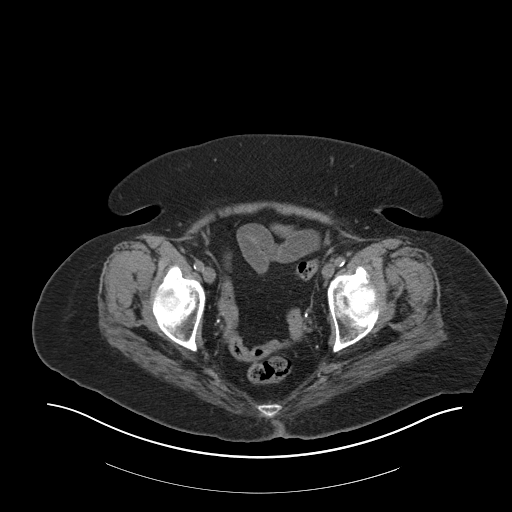
[im 36/107  soft-tissue]
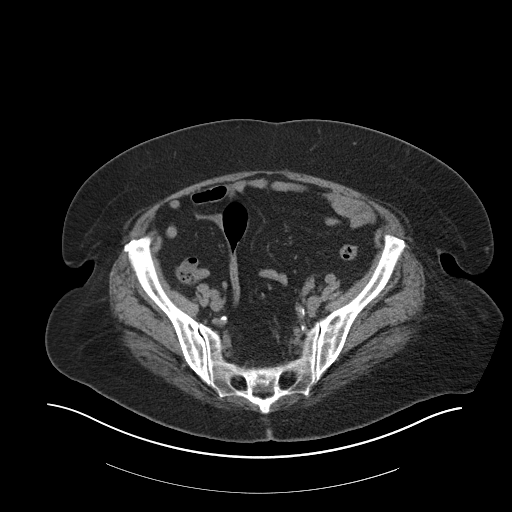
[im 42/107  soft-tissue]
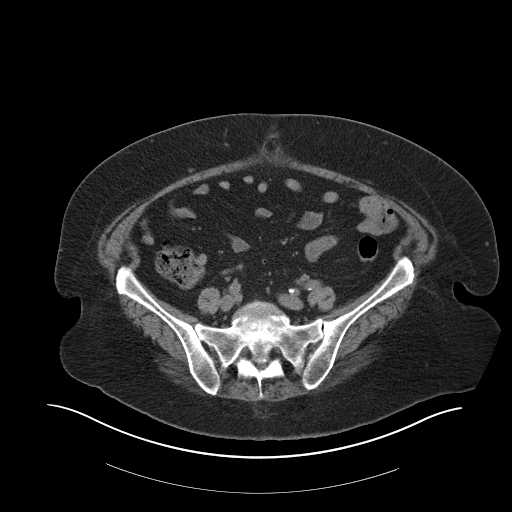
[im 54/107  soft-tissue]
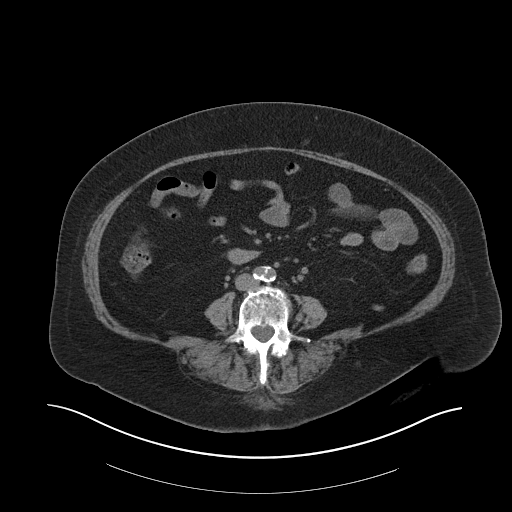
[im 65/107  soft-tissue]
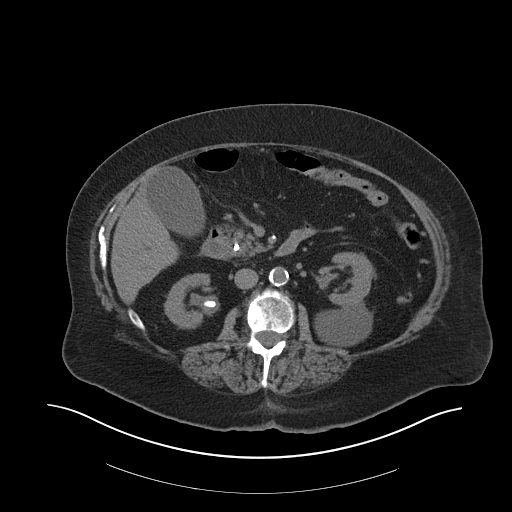
[im 71/107  soft-tissue]
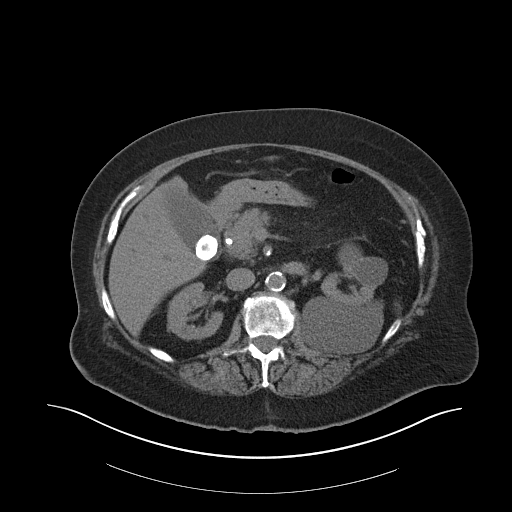
[im 83/107  soft-tissue]
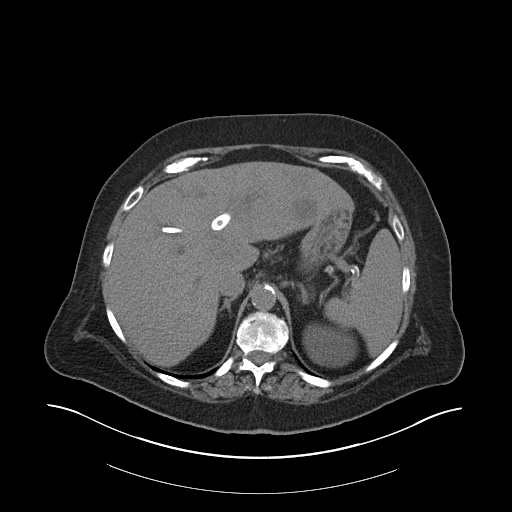
[im 83/107  bone]
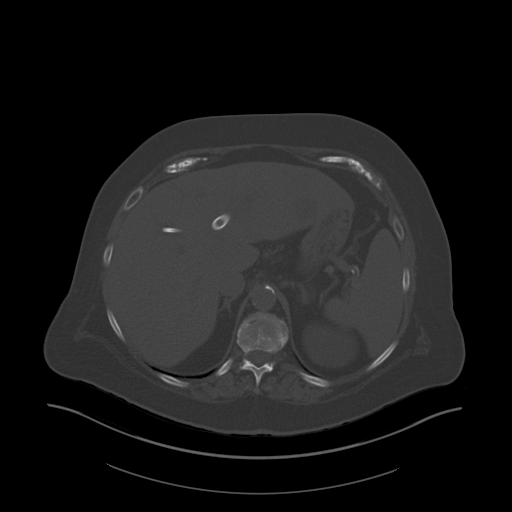
[im 89/107  soft-tissue]
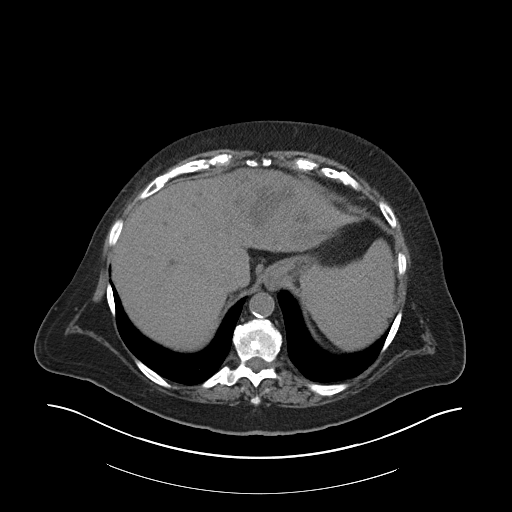
[im 101/107  soft-tissue]
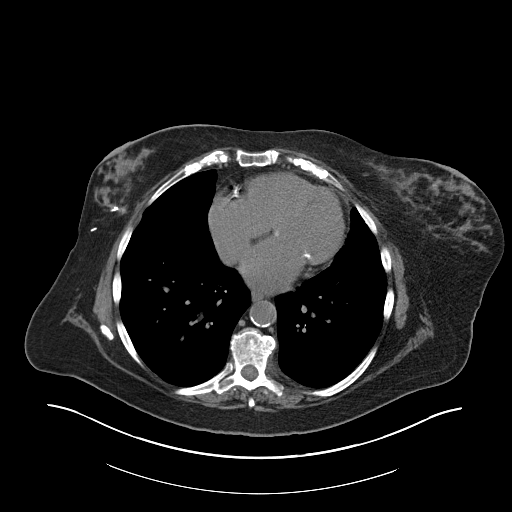

[Series 5: coronal st · coronal · 0.95mm/px · 3 of 194 slices shown]
[im 65/194  soft-tissue]
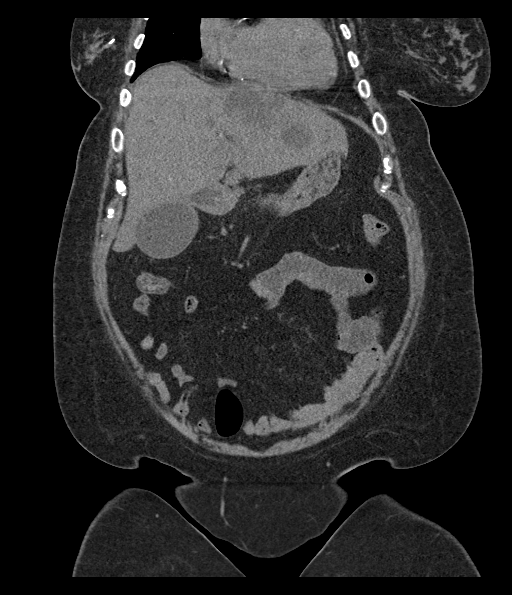
[im 86/194  soft-tissue]
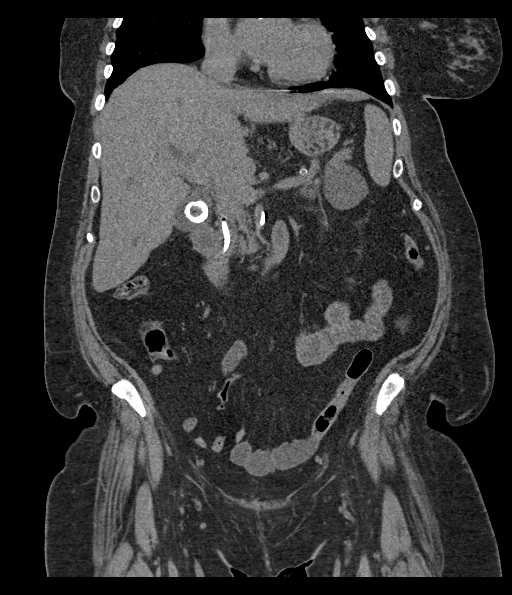
[im 108/194  soft-tissue]
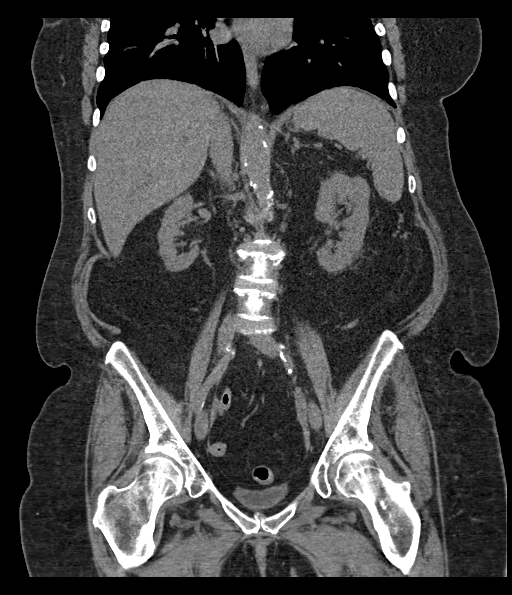

[14 of 46 positions shown; findings below may reference images not displayed]

FINDINGS: Lower chest: A subsolid posterior pleural-based right lower lobe
pulmonary nodule measures 11.3 mm today, on 11/12/2021 measuring
mm with surrounding haziness. Lung bases show scattered scar-like
opacities without further significant findings. There are
postsurgical changes in the outer right breast and dystrophic
calcifications in both breasts.

Hepatobiliary: There has been interval stenting of the right and
left intrahepatic main ducts, with the left sided stent extending
down the CBD into the duodenum.

In left lobe lateral segment there is new demonstration of a sizable
heterogeneous mass measuring 8.4 x 5.4 cm on series 1 axial 17 with
scattered fluid pockets, largest individual fluid pocket approaching
4 cm and could represent a multilocular abscess or metastatic liver
disease.

Infectious process may be favored given that the last study was only
about 6 weeks ago. There is additional new rounded low-density
lesion more laterally within this segment measuring 3.0 cm and
Hounsfield units and a smaller low-density lesion more inferiorly in
this segment measuring 1.2 cm and 16.2 Hounsfield units.

There is still mild intrahepatic biliary dilatation in both lobes
but it is less pronounced than previously.

An ill-defined presumably conglomerate nodal mass at the porta
hepatis today is estimated to measure 5.0 x 3.9 cm and is difficult
to separate from the descending duodenum which it abuts at its
posterior edge. Previously the mass measured about 4.6 x 2.9 cm.

The right lobe demonstrated no focal mass, as visualized without the
use of IV contrast. Gallbladder contains a single 2.5 cm stone is
distended without wall thickening or pericholecystic edema.

Pancreas: Today there is increased pancreatic edema with mild
peripancreatic edema, findings consistent with likely interstitial
pancreatitis. No pancreatic ductal dilatation is seen. Laboratory
and clinical correlation suggested. No pancreatic mass is seen
without contrast.

Spleen: Slightly prominent but otherwise unremarkable without
contrast.

Adrenals/Urinary Tract: There is no adrenal mass. Renal cysts are
again noted, largest on the left measuring up to nearly 9 cm in the
upper pole with 4.7 cm cyst anterior to this.

There is a 3 mm nonobstructing caliceal stone in the inferior pole
left kidney. Again noted is a right renal pelvis stone measuring
x 0.7 cm. There is a 1 cm low-density lesion of the posterior cortex
of the right kidney measuring 45 Hounsfield units which has been
noted on multiple prior studies. There is no obstructing urinary
stone or hydronephrosis. The bladder is contracted and not well
seen.

Stomach/Bowel: No dilatation or wall thickening including the
appendix. Uncomplicated sigmoid diverticulosis.

Vascular/Lymphatic: Conglomerate nodal mass described above in the
porta hepatis. There is aortoiliac atherosclerosis without further
visible adenopathy.

Reproductive: The uterus is absent.  No adnexal mass is seen.

Other: Small umbilical and inguinal fat hernias. No free air,
hemorrhage or fluid.

Musculoskeletal: Osteopenia and degenerative change of the spine.
Slight lumbar levoscoliosis. No worrisome regional skeletal lesion.
IMPRESSION: 1. 8.4 x 5.4 cm heterogeneous mass in the left lobe of the liver in
the lateral segment with fluid pockets measuring up to nearly 4 cm.
This could be a metastasis with areas of internal necrosis or an
abscess with multiple locules. Infectious process is favored given
the relatively short interval time frame of the appearance of this
since the last study.
2. An additional 3.0 cm low-density lesion more laterally within the
segment is noted as well as a 1.2 cm low-density lesion more
inferiorly in this segment, as above could be metastases or due to
infection.
3. Interval biliary stenting. There is still mild intrahepatic
biliary prominence but less pronounced than previously. The
gallbladder is distended and again contains a 2.5 cm stone but there
is no appreciable wall thickening.
4. Porta hepatis mass measuring 5.0 x 3.9 cm is slightly larger than
previously. No other adenopathy is visible. This does abut the
superior aspect of the pancreatic head and neck junction but
probably does not arise from the pancreas. Evaluation limited
without IV contrast.
5. Evidence of acute interstitial pancreatitis. Laboratory and
clinical correlation suggested. No peripancreatic fluid collections.
6. Bilateral nephrolithiasis and cysts.
7. Subsolid right lower lobe pleural-based nodule slightly larger
than previously.

## 2023-07-14 IMAGING — CT CT L SPINE W/O CM
3 series · 15 of 33 positions shown, 18 images · non-contrast
Comparison: None available.

CLINICAL DATA: Initial evaluation for acute lower back pain.

EXAM:
CT LUMBAR SPINE WITHOUT CONTRAST
TECHNIQUE: Multidetector CT imaging of the lumbar spine was performed without
intravenous contrast administration. Multiplanar CT image
reconstructions were also generated.

[Series 4: axial st l-spine · axial · 0.33mm/px · z∈[-372,-154]mm · 7 of 129 slices shown, 9 images]
[im 10/129  soft-tissue]
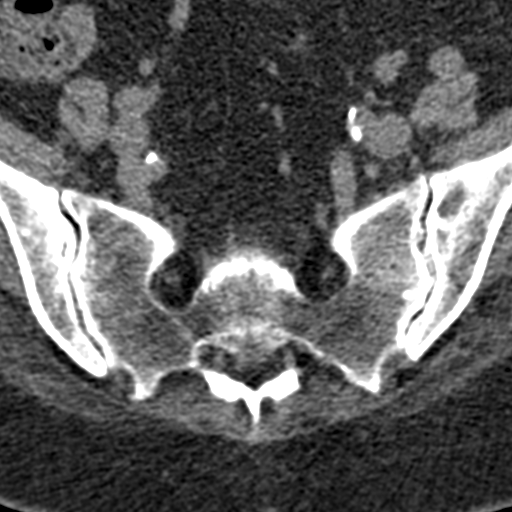
[im 10/129  bone]
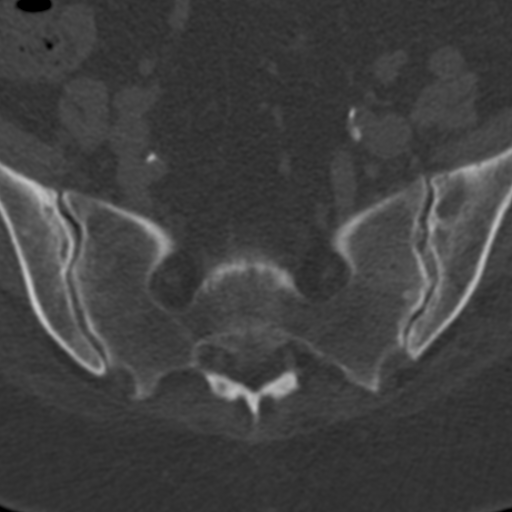
[im 30/129  bone]
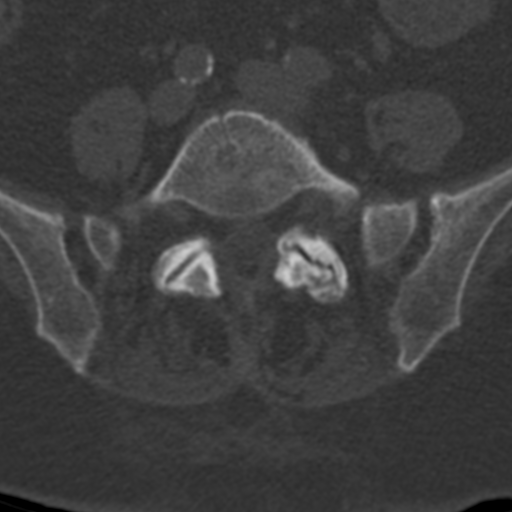
[im 50/129  bone]
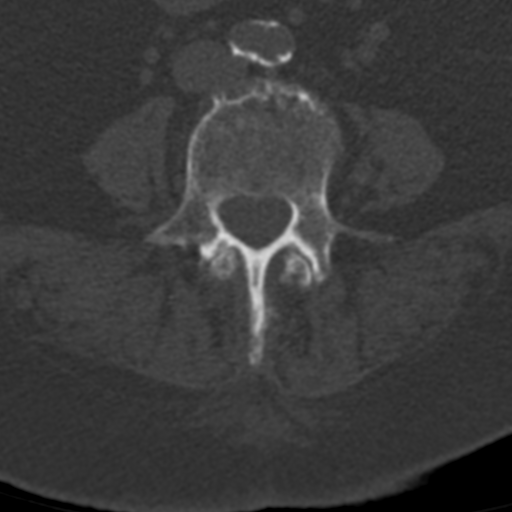
[im 69/129  bone]
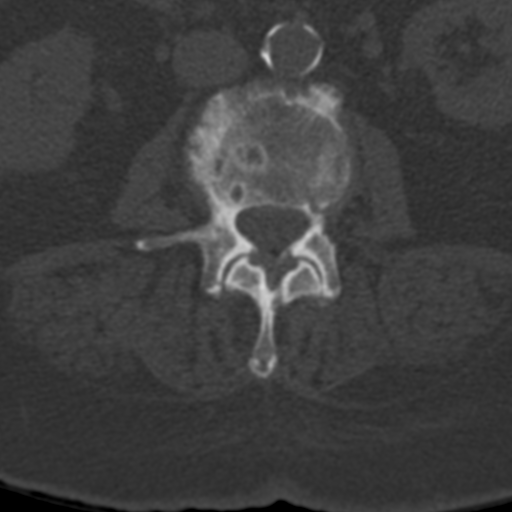
[im 79/129  soft-tissue]
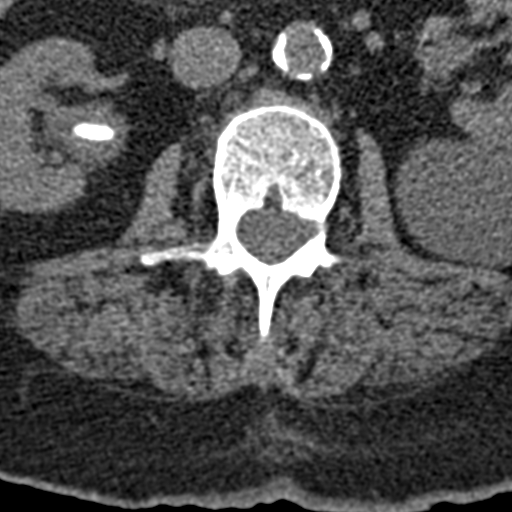
[im 79/129  bone]
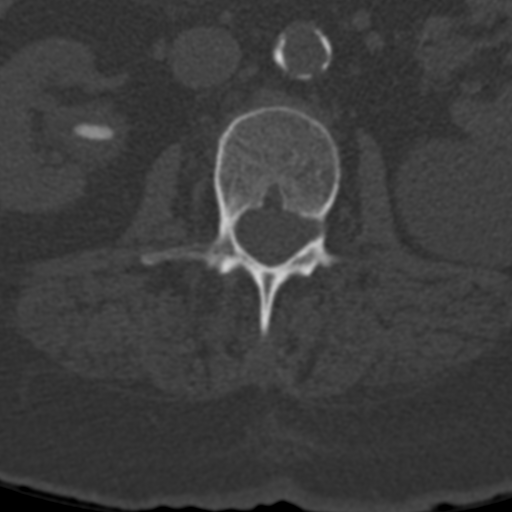
[im 99/129  bone]
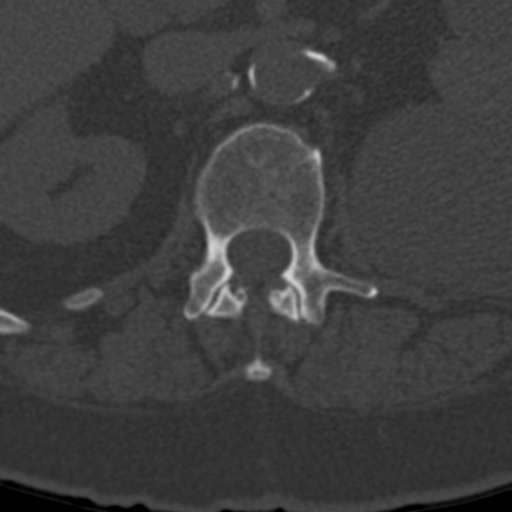
[im 119/129  bone]
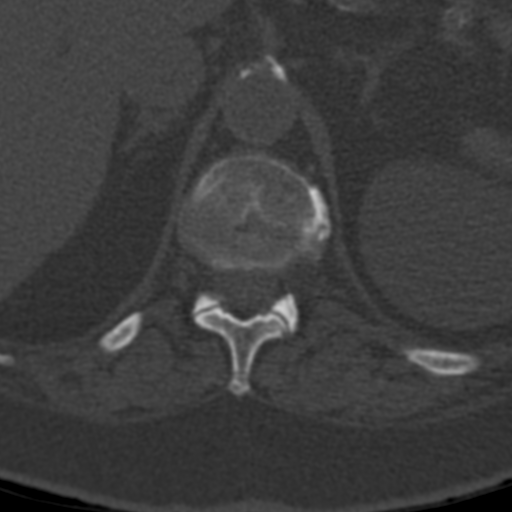

[Series 602: coronal · coronal · 0.50mm/px · 3 of 57 slices shown]
[im 12/57  bone]
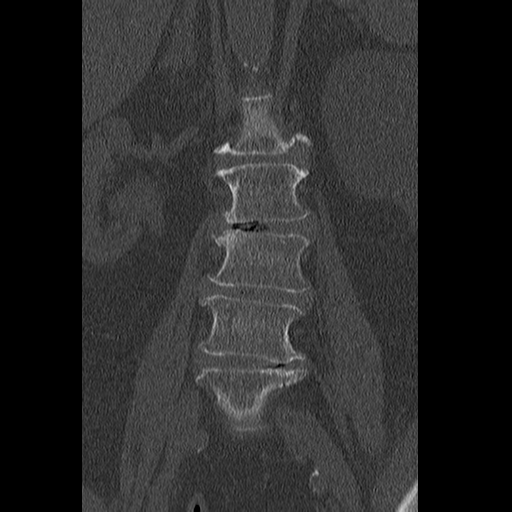
[im 23/57  bone]
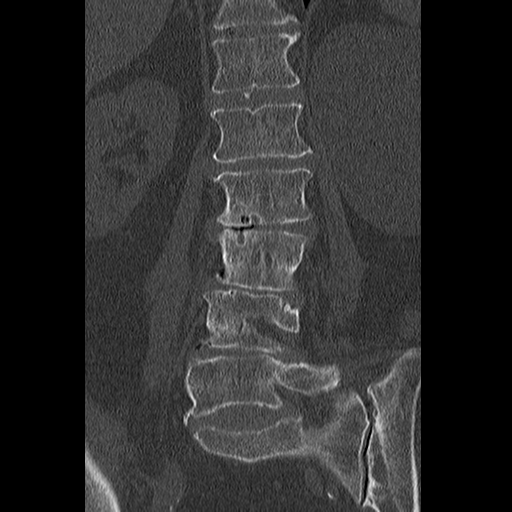
[im 34/57  bone]
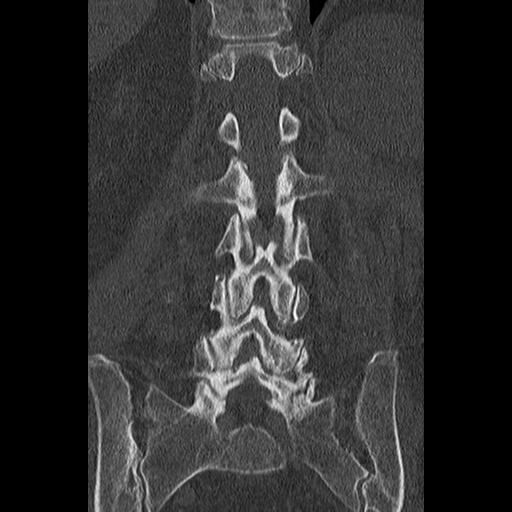

[Series 603: sagittal · sagittal · 0.50mm/px · 5 of 53 slices shown, 6 images]
[im 18/53  bone]
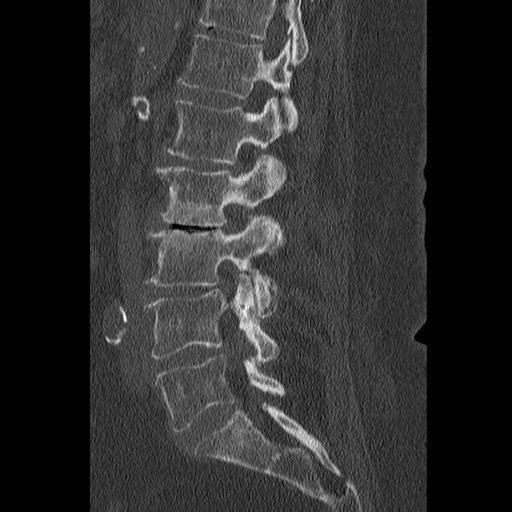
[im 22/53  bone]
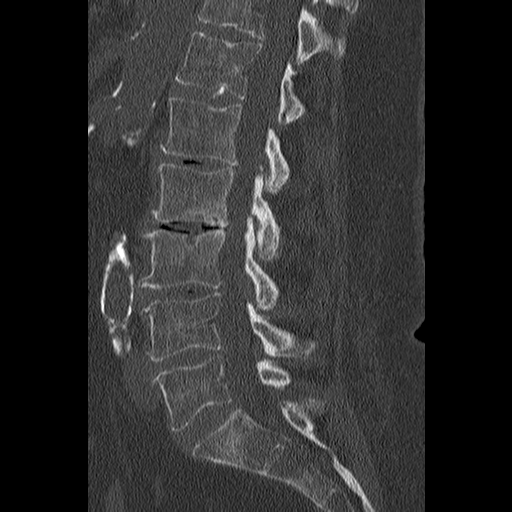
[im 27/53  soft-tissue]
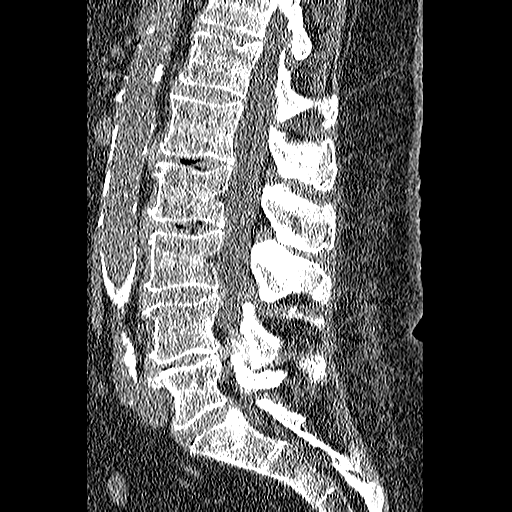
[im 27/53  bone]
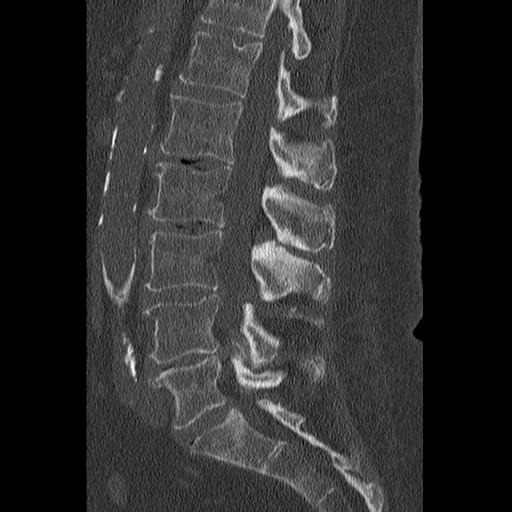
[im 31/53  bone]
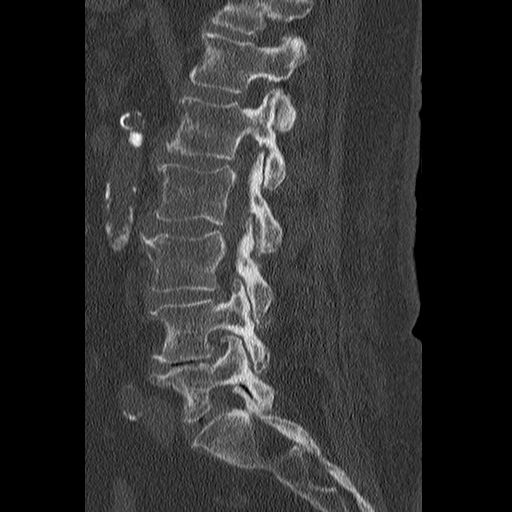
[im 35/53  bone]
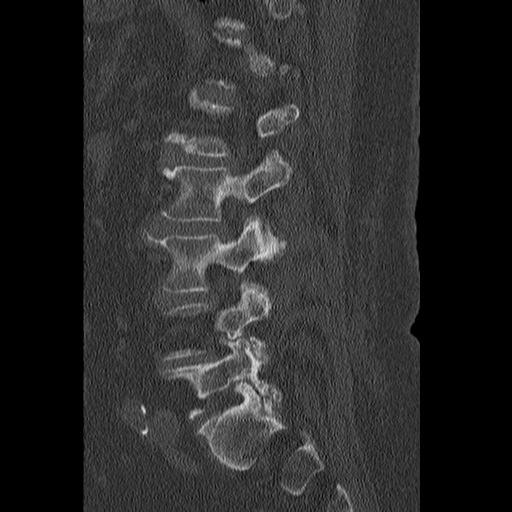

[15 of 33 positions shown; findings below may reference images not displayed]

FINDINGS: Segmentation: Standard. Lowest well-formed disc space labeled the
L5-S1 level.

Alignment: Mild levoscoliosis with apex at L2-3. Alignment otherwise
normal with preservation of the normal lumbar lordosis. No
listhesis.

Vertebrae: Vertebral body height maintained without acute or chronic
fracture. Visualized sacrum and pelvis intact. SI joints symmetric
and normal. No worrisome lytic or blastic osseous lesions.

Paraspinal and other soft tissues: Paraspinous soft tissues
demonstrate no acute finding. Advanced aorto bi-iliac
atherosclerotic disease noted. Probable large exophytic left renal
cyst partially visualized. 4 mm nonobstructive left renal calculus
noted. 1.3 cm stone positioned within the right renal pelvis with
associated localized pelviectasis. No overt hydronephrosis.
Additional note made of a approximate 1 cm exophytic hyperdense
lesion extending from the posterior aspect of the interpolar right
kidney, nonspecific, but likely a proteinaceous and/or hemorrhagic
cyst.

Disc levels: T12-L1: Negative interspace. Mild facet hypertrophy. No
stenosis.

L1-2: Degenerative intervertebral disc space narrowing with disc
desiccation and diffuse disc bulge, slightly eccentric to the left.
Mild facet hypertrophy. No spinal stenosis. Foramina remain patent.

L2-3: Mild intervertebral disc space narrowing with diffuse disc
bulge and disc desiccation. Disc bulging slightly asymmetric to the
right. Mild facet hypertrophy. Resultant mild canal with mild right
worse than left lateral recess stenosis. Mild right L2 foraminal
narrowing. Left neural foramina remains patent.

L3-4: Mild right eccentric disc bulge. Moderate right worse than
left facet hypertrophy. Resultant mild canal with bilateral lateral
recess stenosis, worse on the right. Mild right L3 foraminal
narrowing. Left neural foramina remains patent.

L4-5: Diffuse disc bulge, asymmetric to the right. Moderate facet
and ligament flavum hypertrophy. Resultant moderate to severe canal
with bilateral subarticular stenosis. Moderate left L4 foraminal
narrowing. Right neural foramina remains patent.

L5-S1: Minimal disc bulge. Moderate facet hypertrophy. No
significant spinal stenosis. Foramina remain patent.
IMPRESSION: 1. No acute abnormality within the lumbar spine.
2. Multilevel degenerative spondylosis and facet hypertrophy.
Findings most pronounced at L4-5 where there is resultant resulting
in moderate to severe canal with bilateral subarticular stenosis,
with moderate left L4 foraminal narrowing.
3. Bilateral nephrolithiasis, with 1.3 cm stone positioned within
the right renal pelvis. Associated localized pelviectasis without
overt hydronephrosis.
4. Aortic Atherosclerosis ([id number]).

## 2023-07-16 IMAGING — DX DG CHEST 1V PORT
1 series · 1 of 1 positions shown · non-contrast
Comparison: 09/16/2020

CLINICAL DATA: Pt c/o left sided back pain, especially with
movement, sob, weakness.

EXAM:
PORTABLE CHEST 1 VIEW

[chest ap]
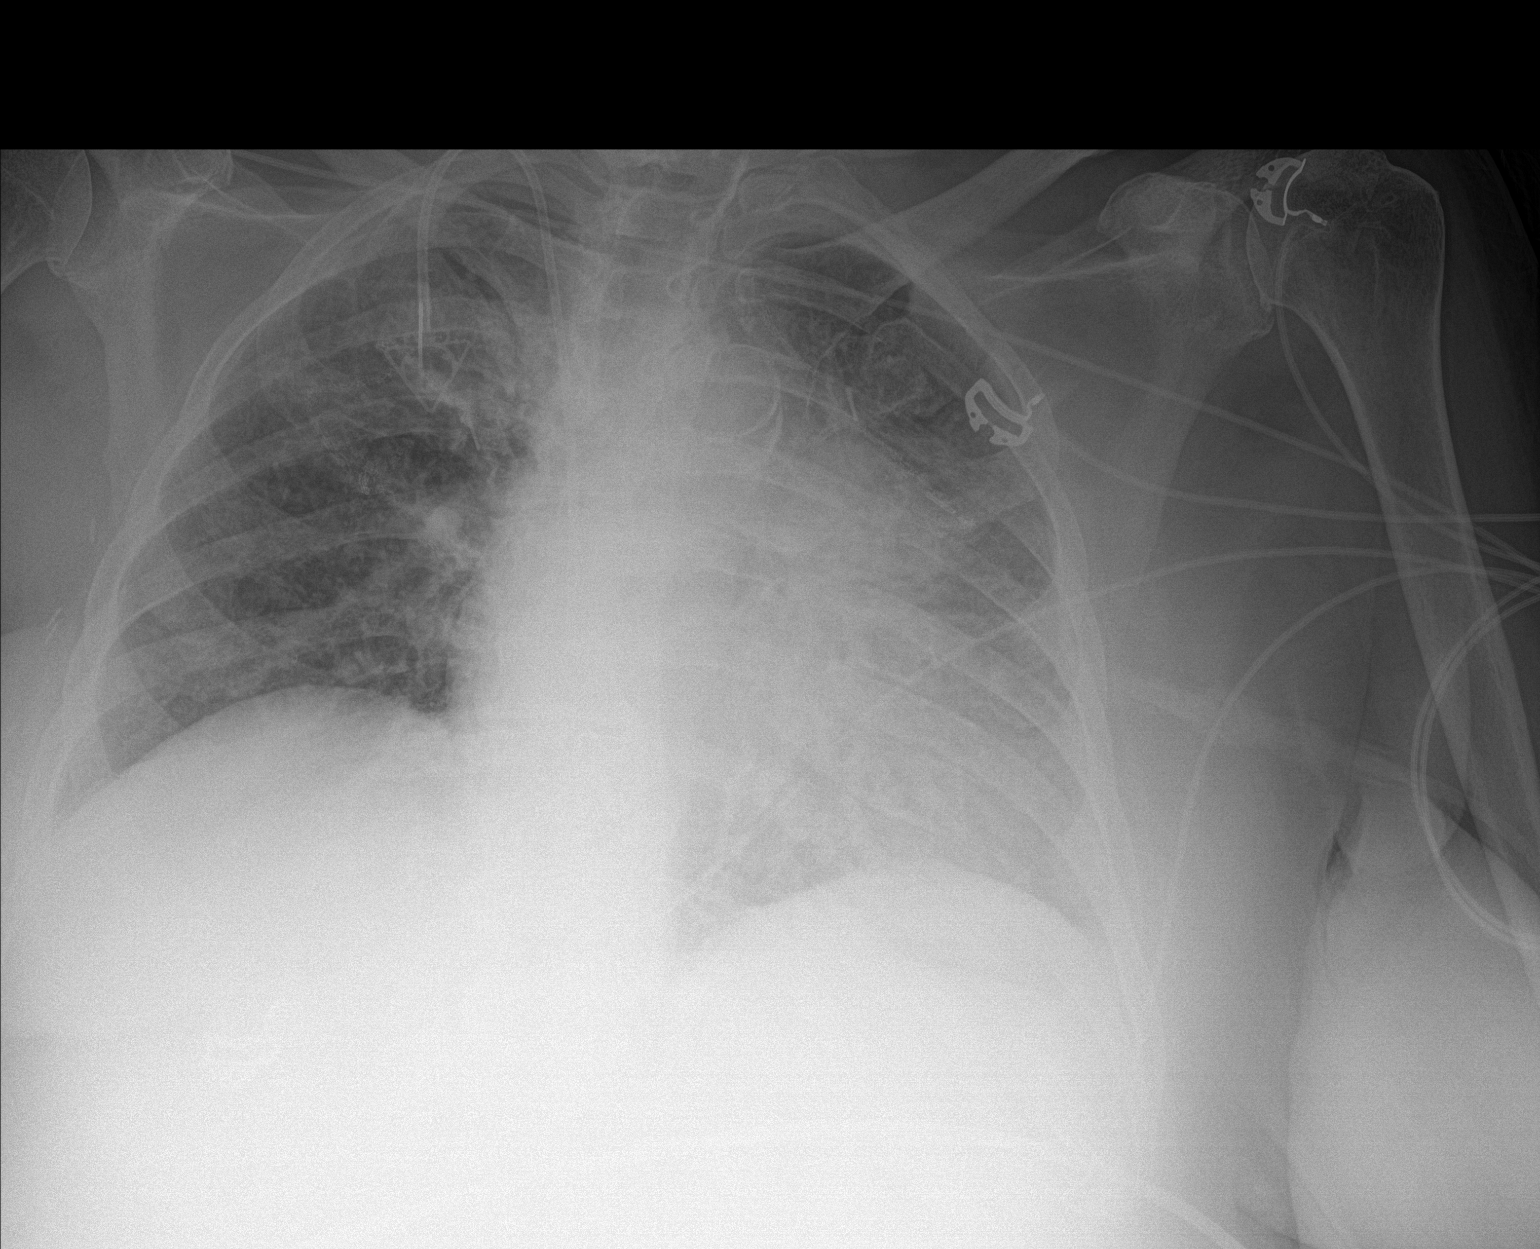

[1 of 1 positions shown; findings below may reference images not displayed]

FINDINGS: Cardiac silhouette is borderline enlarged. No convincing mediastinal
or hilar masses. Right anterior chest wall Port-A-Cath is stable,
tip in the lower superior vena cava.

Lungs demonstrate diffuse bilateral interstitial prominence. No lung
consolidation. No convincing pleural effusion and no pneumothorax.

Skeletal structures are grossly intact.
IMPRESSION: 1. No acute cardiopulmonary disease.

## 2023-07-18 IMAGING — CT CT IMAGE GUIDED DRAINAGE BY PERCUTANEOUS CATHETER
1 of 3 series · 14 of 32 positions shown, 18 images · non-contrast
Comparison: none

CLINICAL DATA: History of lung, colon, and breast cancer.
Obstructing mass at the hepatic hilum, now status post endoscopic
stent placement across the biliary confluence. New abdominal pain,
CT demonstrating new fluid attenuation collections in the left lobe
of the liver concerning for hepatic abscesses. Percutaneous drainage
requested.

[Series 2: i-spiral 5.0 bf37 · axial · 0.98mm/px · z∈[-154,+67]mm · 14 of 71 slices shown, 18 images]
[im 4/71  soft-tissue]
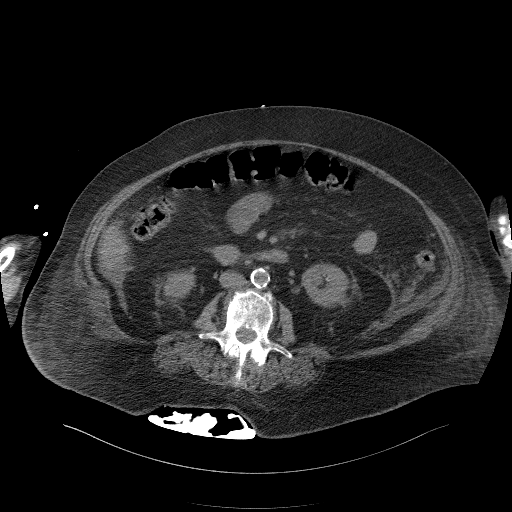
[im 4/71  bone]
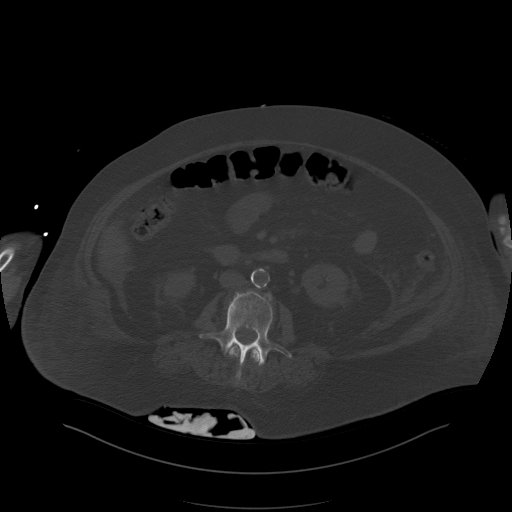
[im 11/71  soft-tissue]
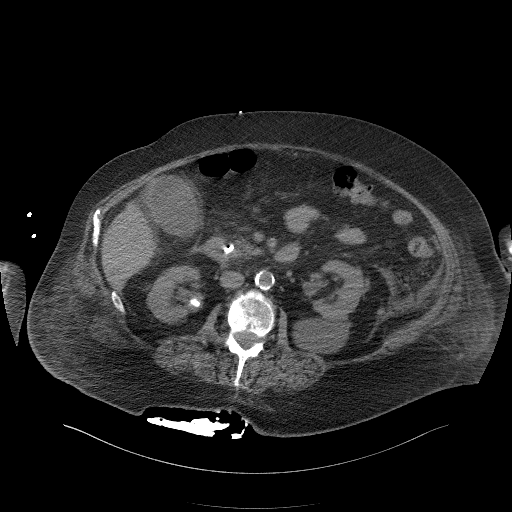
[im 17/71  soft-tissue]
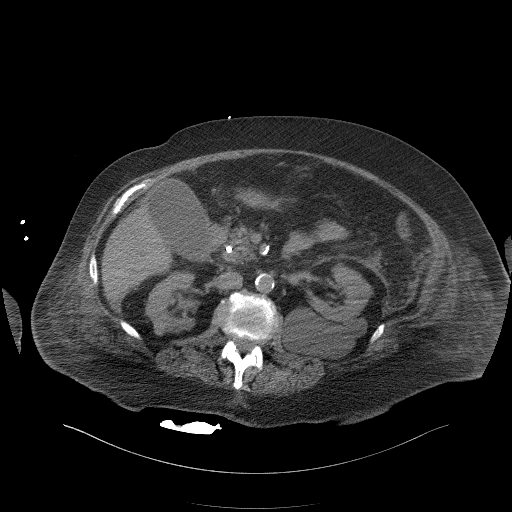
[im 21/71  soft-tissue]
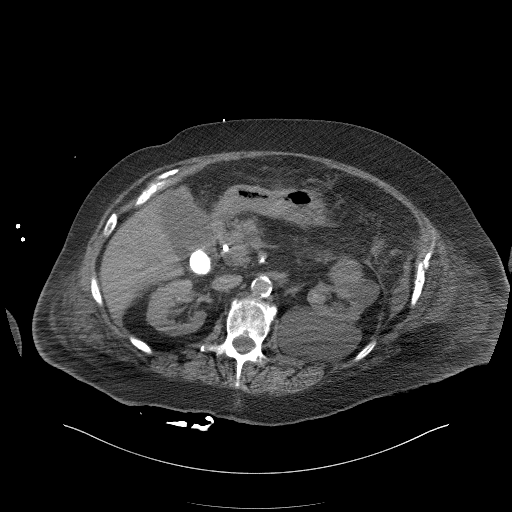
[im 27/71  soft-tissue]
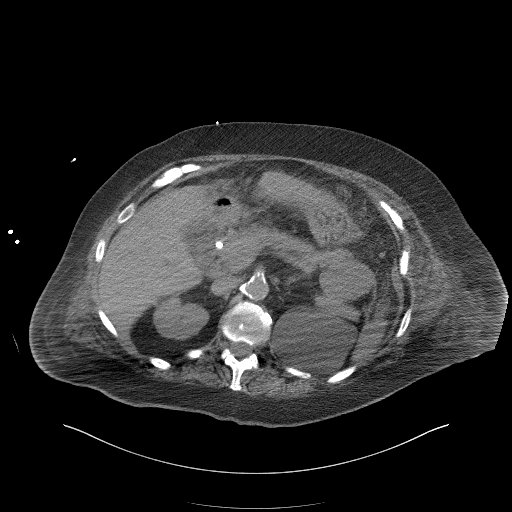
[im 34/71  soft-tissue]
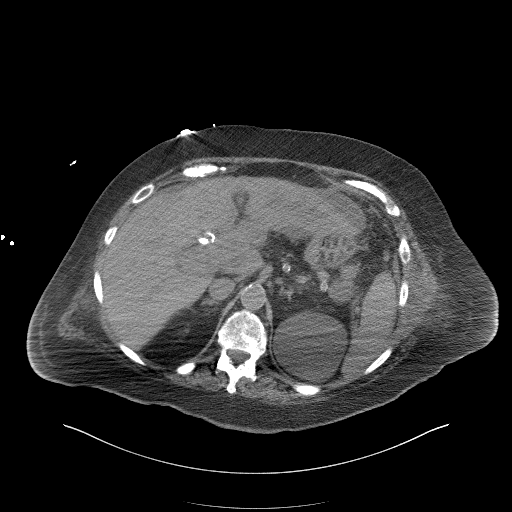
[im 37/71  soft-tissue]
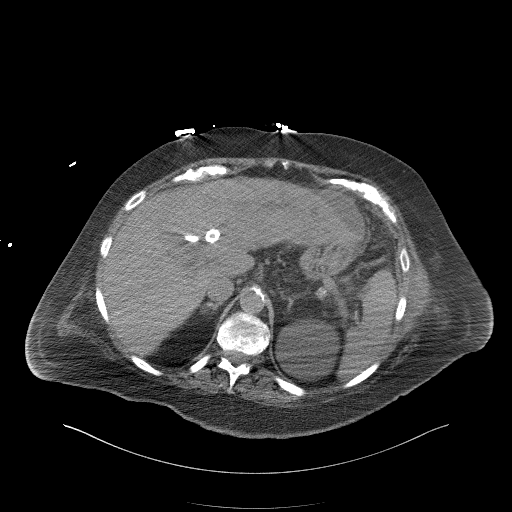
[im 44/71  soft-tissue]
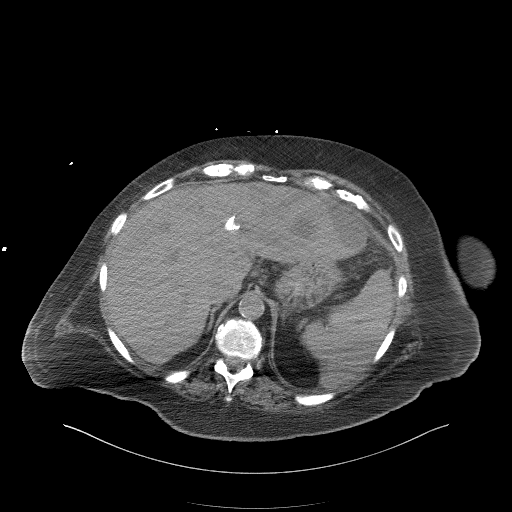
[im 51/71  soft-tissue]
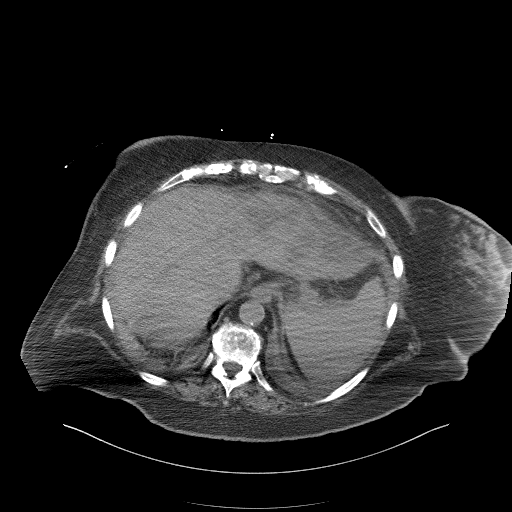
[im 51/71  bone]
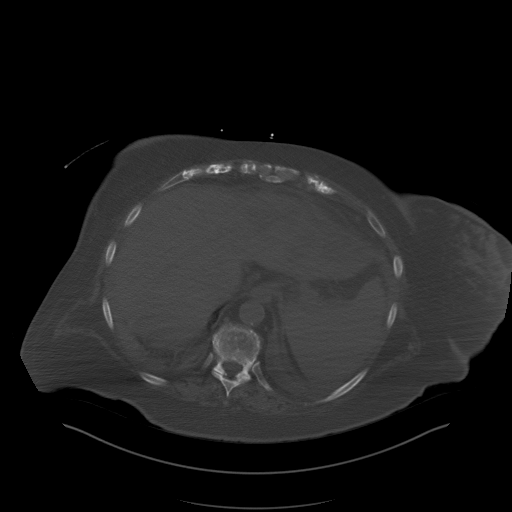
[im 54/71  soft-tissue]
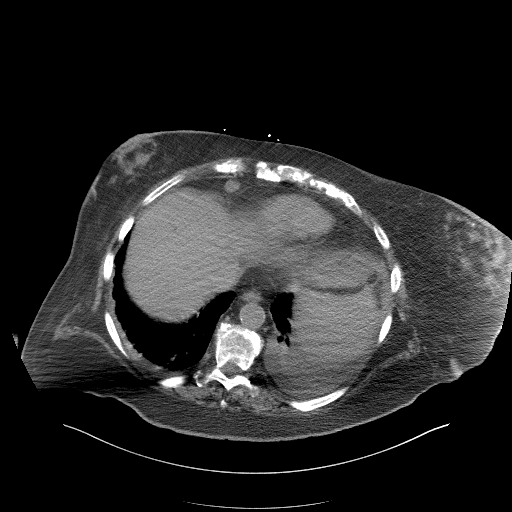
[im 57/71  lung]
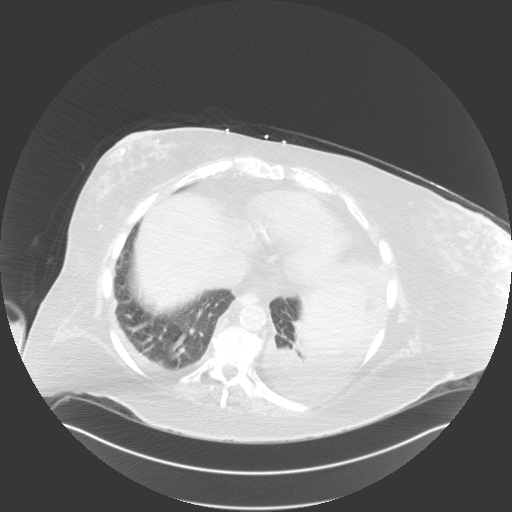
[im 61/71  soft-tissue]
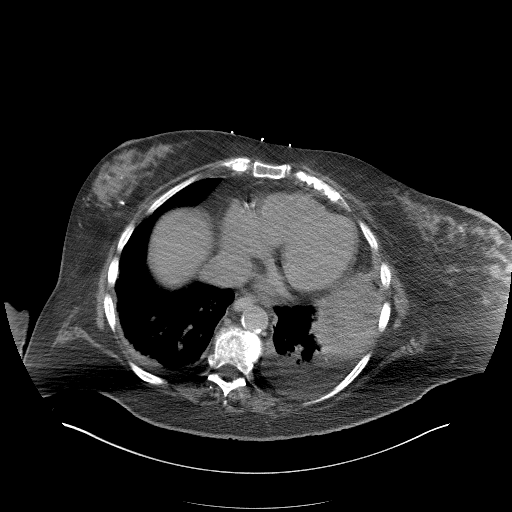
[im 61/71  lung]
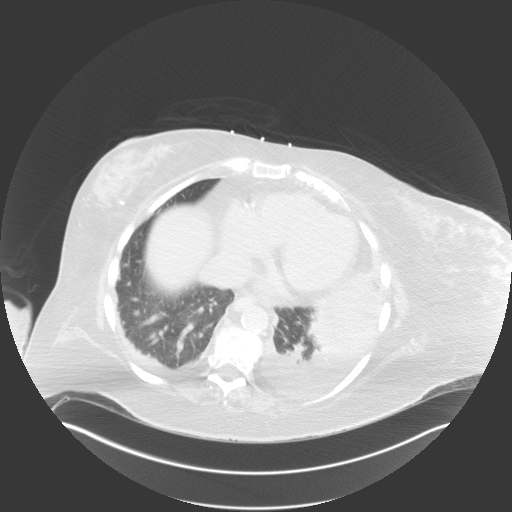
[im 64/71  lung]
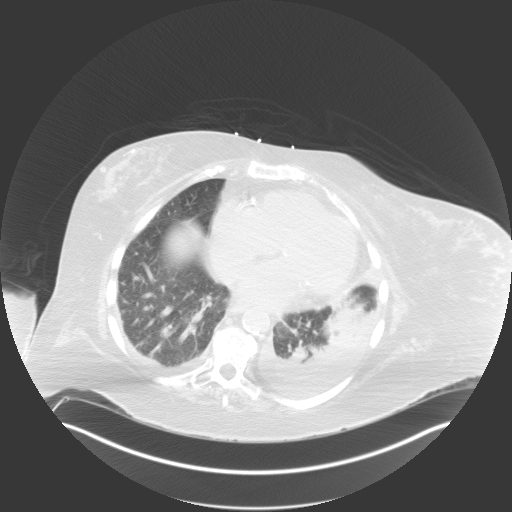
[im 67/71  soft-tissue]
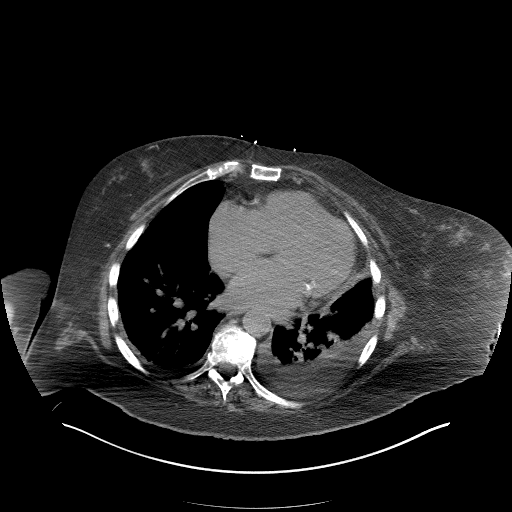
[im 67/71  lung]
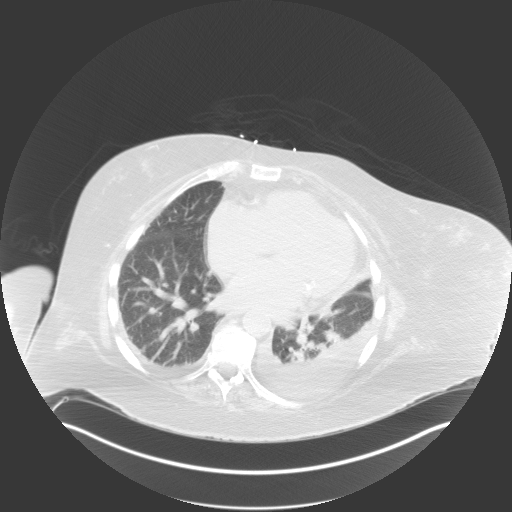

[14 of 32 positions shown; findings below may reference images not displayed]

EXAM:
CT GUIDED DRAINAGE OF HEPATIC ABSCESS

ANESTHESIA/SEDATION:
Intravenous Fentanyl 84mcg and Versed 1.5mg were administered as
conscious sedation during continuous monitoring of the patient's
level of consciousness and physiological / cardiorespiratory status
by the radiology RN, with a total moderate sedation time of 18
minutes.

PROCEDURE:
The procedure, risks, benefits, and alternatives were explained to
the patient. Questions regarding the procedure were encouraged and
answered. The patient understands and consents to the procedure.

Select axial scans through the liver were obtained. A new left
subcapsular collection was identified. The dominant left hepatic
lobe collection was localized and an appropriate skin entry site was
determined and marked.

The operative field was prepped with chlorhexidinein a sterile
fashion, and a sterile drape was applied covering the operative
field. A sterile gown and sterile gloves were used for the
procedure. Local anesthesia was provided with 1% Lidocaine.

Under CT fluoroscopic guidance, 18 gauge trocar needle advanced into
the dominant left hepatic lobe collection. Greenish purulent
material could be aspirated. Amplatz wire formed centrally within
the cavity. The tract was dilated to facilitate placement of a 10
French pigtail drain catheter, formed centrally within the
collection, position confirmed on CT. Proximally 20 mL of green
purulent material were aspirated, sent for Gram stain and culture.
The catheter was secured externally with 0 Prolene suture and
StatLock and placed to gravity drain bag. The patient tolerated the
procedure well.

COMPLICATIONS:
None immediate
FINDINGS: Multiple low-attenuation collections in the left hepatic lobe were
localized. There is also anterior subcapsular collection involving
hepatic segments 2 and 3. purulent appearing material was aspirated
from the dominant left hepatic lobe collection. 10 French pigtail
drain catheter placed as above.
IMPRESSION: 1. Technically successful CT-guided hepatic abscess drain catheter
placement.

## 2023-07-19 IMAGING — DX DG CHEST 1V PORT
1 series · 1 of 1 positions shown · non-contrast
Comparison: Chest radiograph 12/28/2021

CLINICAL DATA: Follow-up pneumonia

EXAM:
PORTABLE CHEST 1 VIEW

[chest ap]
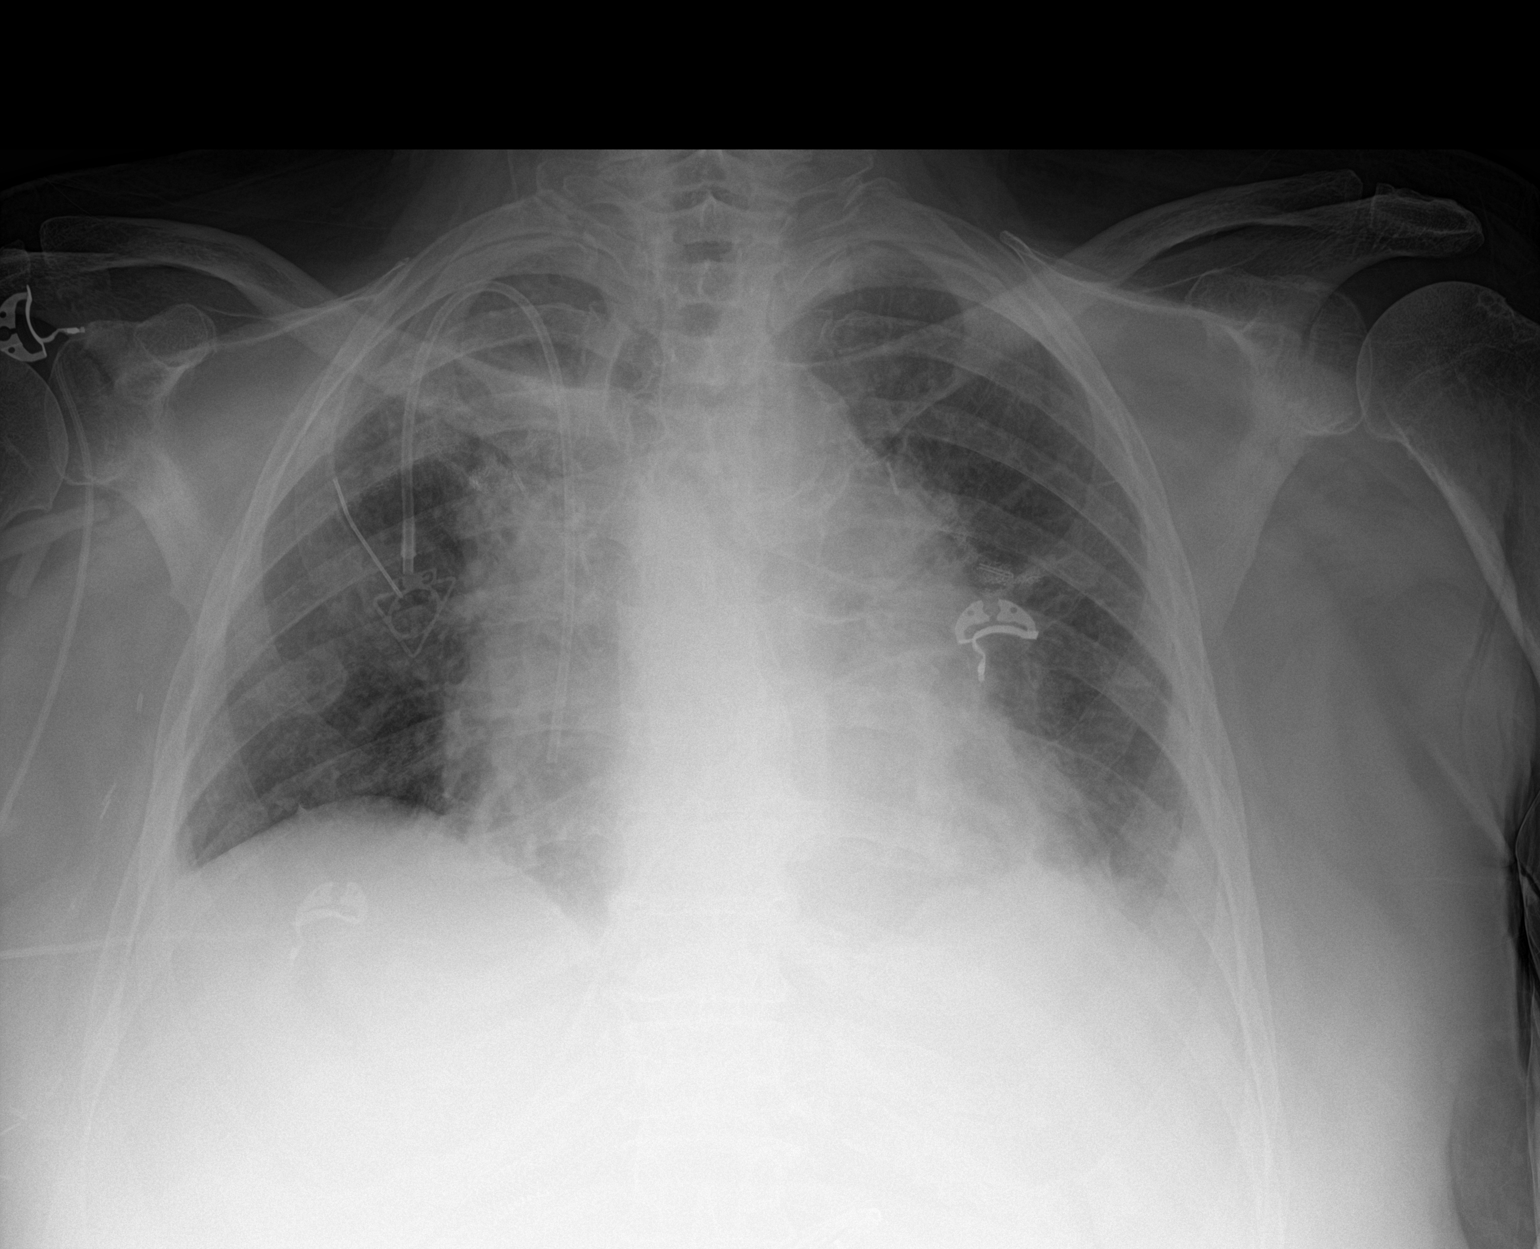

[1 of 1 positions shown; findings below may reference images not displayed]

FINDINGS: The right chest wall port is in stable position. The heart is
enlarged, unchanged. The mediastinal contours are prominent but
unchanged.

There there are probable trace bilateral pleural effusions with mild
adjacent atelectasis. There is no pulmonary edema. There is no
pneumothorax.

The bones are stable.
IMPRESSION: Trace bilateral pleural effusions with mild adjacent atelectasis.
